# Patient Record
Sex: Female | Born: 1965
Health system: Southern US, Community
[De-identification: ages and names within clinical notes are randomized; demographics above are authoritative.]

## PROBLEM LIST (undated history)

## (undated) DIAGNOSIS — N809 Endometriosis, unspecified: Secondary | ICD-10-CM

## (undated) DIAGNOSIS — T7840XA Allergy, unspecified, initial encounter: Secondary | ICD-10-CM

## (undated) DIAGNOSIS — R51 Headache: Secondary | ICD-10-CM

## (undated) DIAGNOSIS — R600 Localized edema: Secondary | ICD-10-CM

## (undated) DIAGNOSIS — N838 Other noninflammatory disorders of ovary, fallopian tube and broad ligament: Secondary | ICD-10-CM

## (undated) DIAGNOSIS — Z9221 Personal history of antineoplastic chemotherapy: Secondary | ICD-10-CM

## (undated) DIAGNOSIS — C801 Malignant (primary) neoplasm, unspecified: Secondary | ICD-10-CM

## (undated) DIAGNOSIS — E785 Hyperlipidemia, unspecified: Secondary | ICD-10-CM

## (undated) DIAGNOSIS — E119 Type 2 diabetes mellitus without complications: Secondary | ICD-10-CM

## (undated) DIAGNOSIS — Z803 Family history of malignant neoplasm of breast: Secondary | ICD-10-CM

## (undated) DIAGNOSIS — R519 Headache, unspecified: Secondary | ICD-10-CM

## (undated) DIAGNOSIS — F32A Depression, unspecified: Secondary | ICD-10-CM

## (undated) DIAGNOSIS — C50919 Malignant neoplasm of unspecified site of unspecified female breast: Secondary | ICD-10-CM

## (undated) DIAGNOSIS — F329 Major depressive disorder, single episode, unspecified: Secondary | ICD-10-CM

## (undated) DIAGNOSIS — I890 Lymphedema, not elsewhere classified: Secondary | ICD-10-CM

## (undated) DIAGNOSIS — Z923 Personal history of irradiation: Secondary | ICD-10-CM

## (undated) DIAGNOSIS — J189 Pneumonia, unspecified organism: Secondary | ICD-10-CM

## (undated) HISTORY — DX: Localized edema: R60.0

## (undated) HISTORY — DX: Endometriosis, unspecified: N80.9

## (undated) HISTORY — DX: Other noninflammatory disorders of ovary, fallopian tube and broad ligament: N83.8

## (undated) HISTORY — DX: Allergy, unspecified, initial encounter: T78.40XA

## (undated) HISTORY — DX: Family history of malignant neoplasm of breast: Z80.3

## (undated) HISTORY — PX: BREAST BIOPSY: SHX20

## (undated) HISTORY — DX: Type 2 diabetes mellitus without complications: E11.9

## (undated) HISTORY — DX: Hyperlipidemia, unspecified: E78.5

## (undated) HISTORY — DX: Pneumonia, unspecified organism: J18.9

## (undated) HISTORY — DX: Lymphedema, not elsewhere classified: I89.0

## (undated) HISTORY — PX: TUBAL LIGATION: SHX77

## (undated) HISTORY — PX: OOPHORECTOMY: SHX86

---

## 2008-11-24 ENCOUNTER — Ambulatory Visit: Payer: Self-pay | Admitting: Family Medicine

## 2008-11-24 IMAGING — US US BREAST BILAT
1 series · 17 of 25 positions shown · non-contrast
Comparison: none

REASON FOR EXAM: bilateral breast tenderness right beast nodule 6 cm 9
oclock  left breast 4 cm nod...
COMMENTS:

[Series 1: us breast bilat · 17 of 30 slices shown]
[im 1/30]
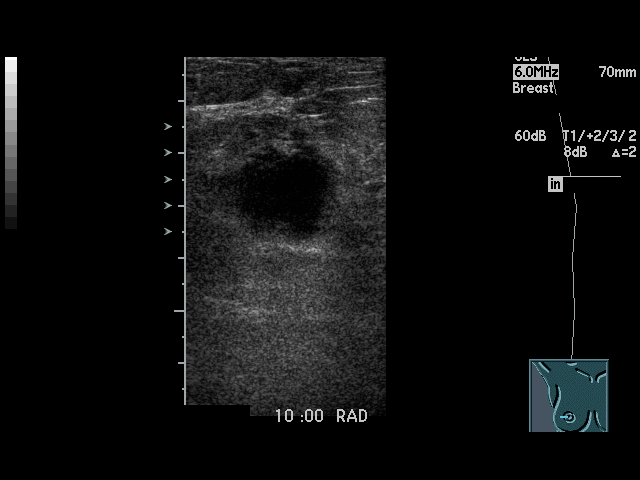
[im 3/30]
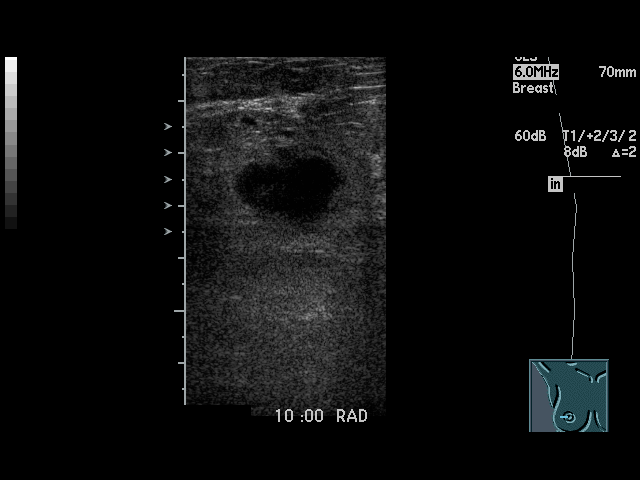
[im 4/30]
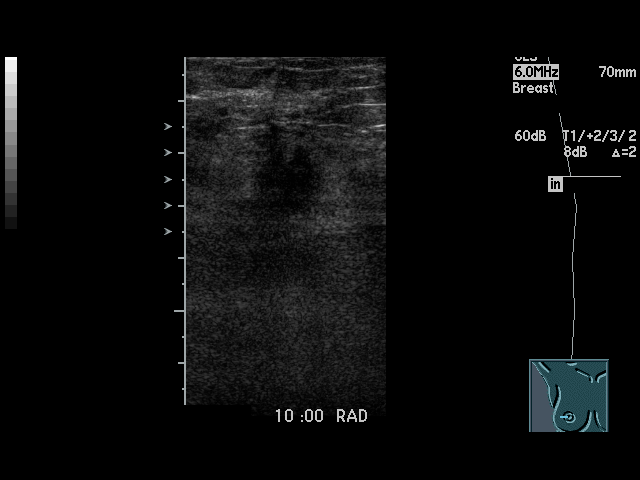
[im 7/30]
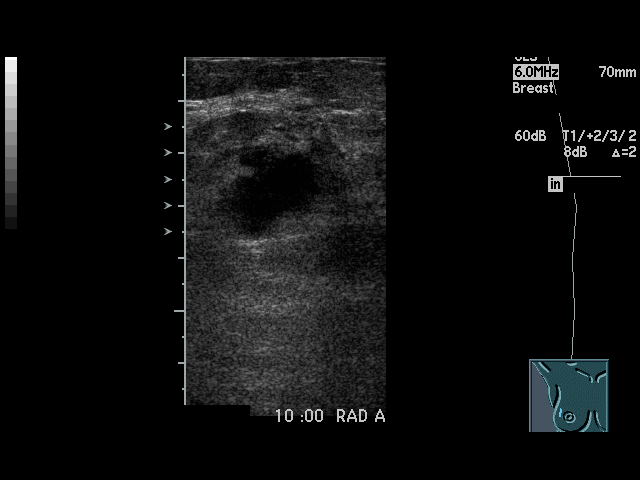
[im 8/30]
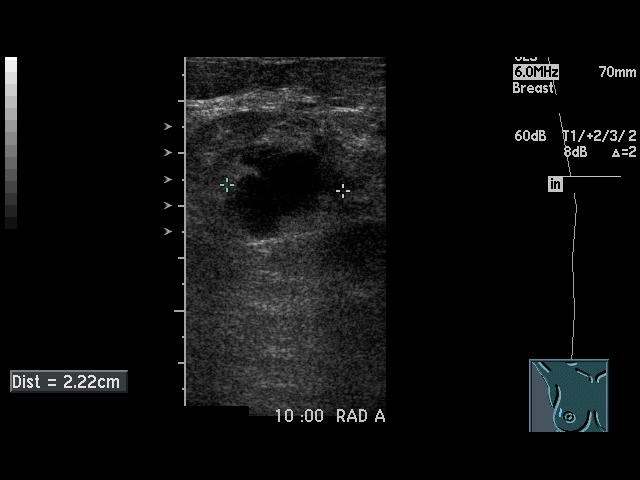
[im 10/30]
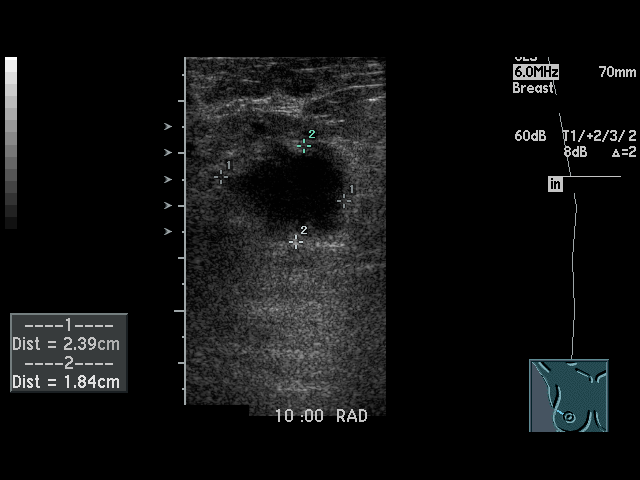
[im 11/30]
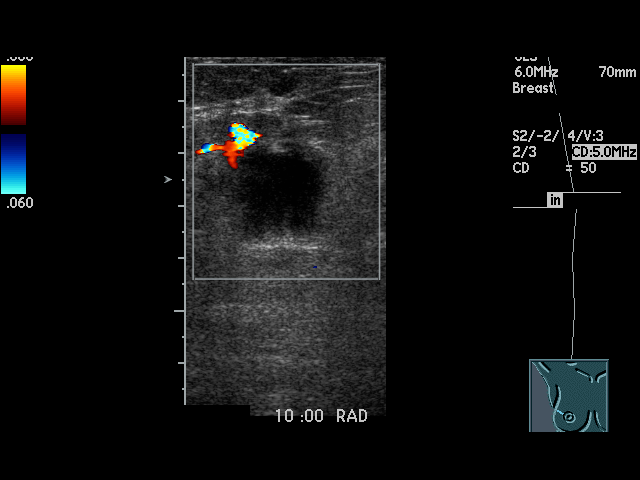
[im 14/30]
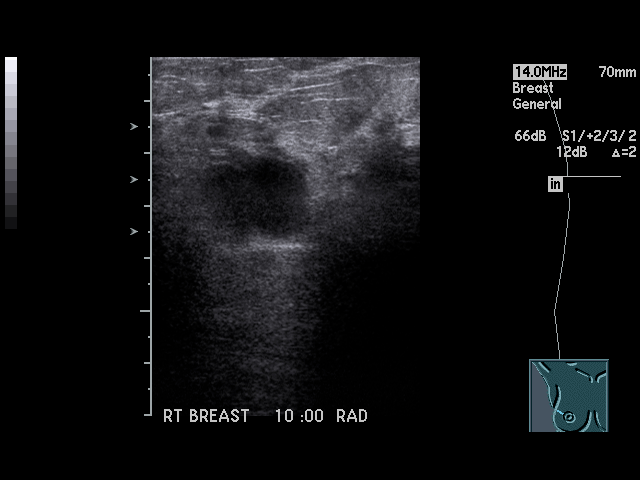
[im 15/30]
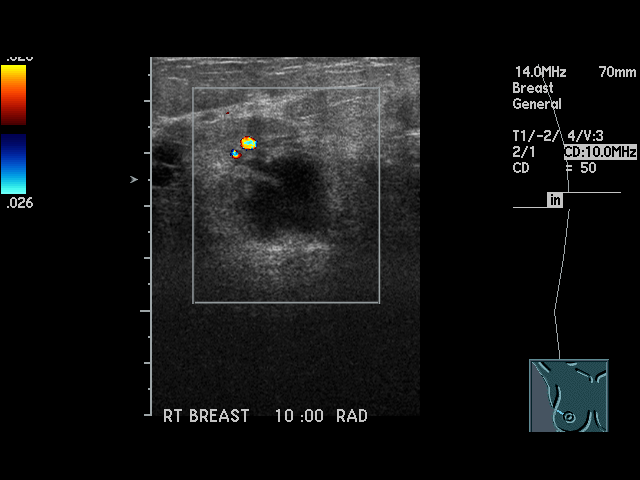
[im 16/30]
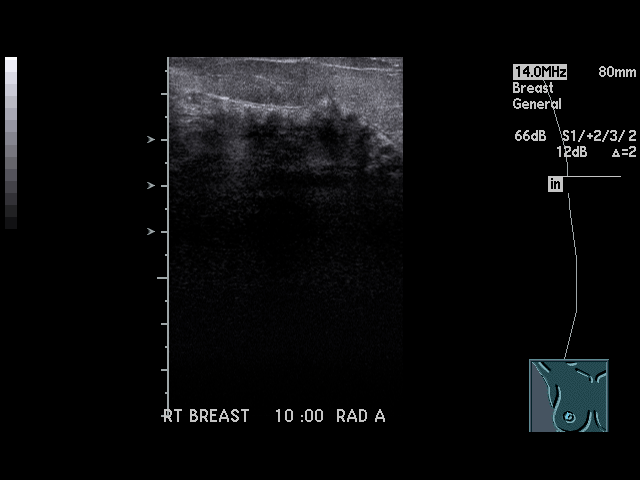
[im 19/30]
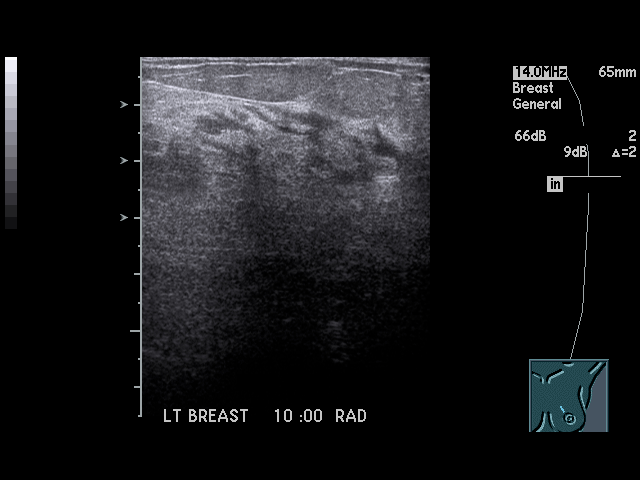
[im 20/30]
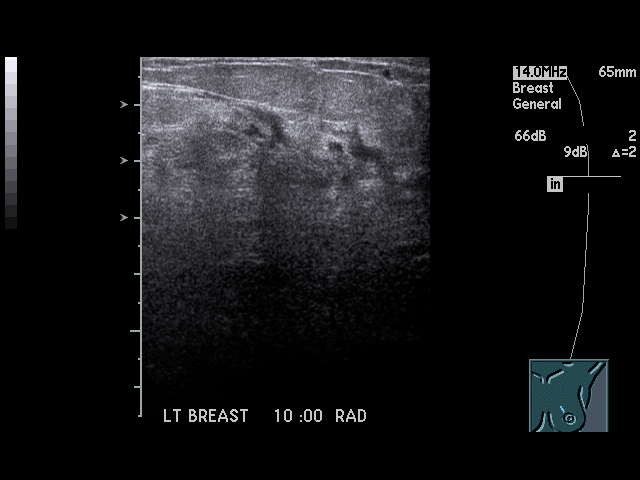
[im 22/30]
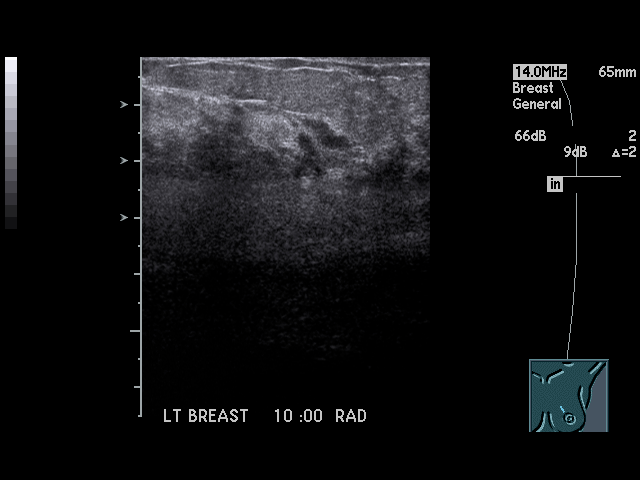
[im 23/30]
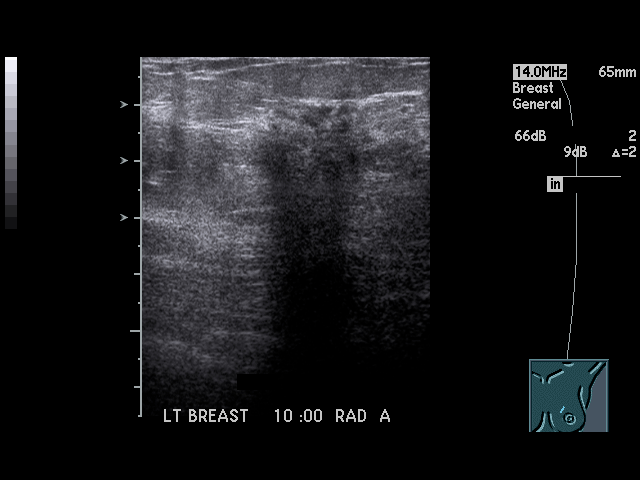
[im 26/30]
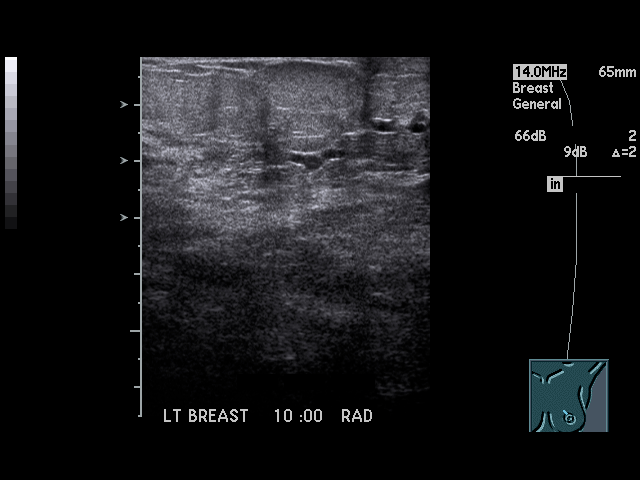
[im 27/30]
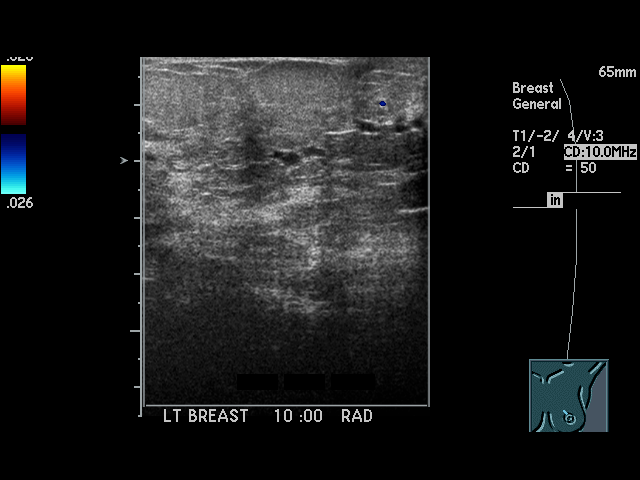
[im 30/30]
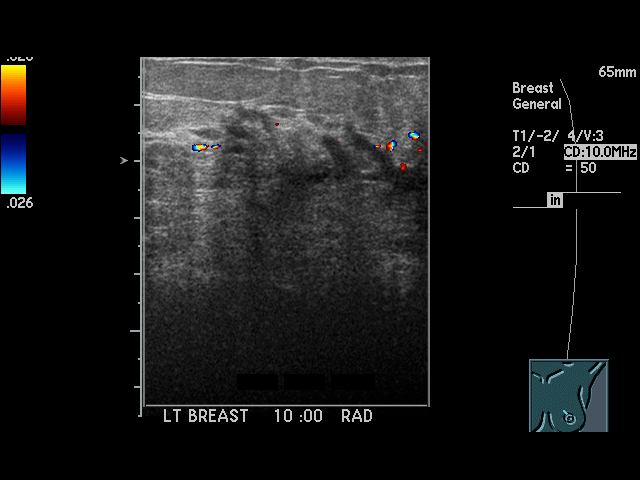

[17 of 25 positions shown; findings below may reference images not displayed]

PROCEDURE:     US  - US BREAST BILATERAL  - [DATE]  [DATE]

RESULT:     The patient is being evaluated for nodularity in the upper inner
aspect of the left breast and upper outer aspect of the right breast.

On the left at the 10 o'clock region there are a few dilated ducts
demonstrated but there is no discrete cystic or solid mass. On the right at
the 10 o'clock position there is an irregularly marginated anechoic
structure with enhanced-through transmission. This measures 2.4 x 1.8 x
cm.
IMPRESSION: 1.On the right at the 10 o'clock position there is a dominant, irregular,
somewhat thick walled cystic appearing structure with enhanced-through
transmission which measures 2.4 cm in greatest dimension.

2.On the left there are a few dilated ducts in the region of the palpable
nodularity at approximately the 10 o'clock position.

Please see the dictation of the mammogram of this same date for final
recommendations and BI-RADS classification.

## 2008-11-24 IMAGING — MG MM CAD DIAGNOSTIC MAMMO
1 series · 8 of 8 positions shown · non-contrast
Comparison: none

REASON FOR EXAM: bil breast tenderness right beast nodule 6 cm 9 oclock
left breast 4 cm nod...
COMMENTS:

[R CC · right · 8 of 14 slices shown]
[im 1/14]
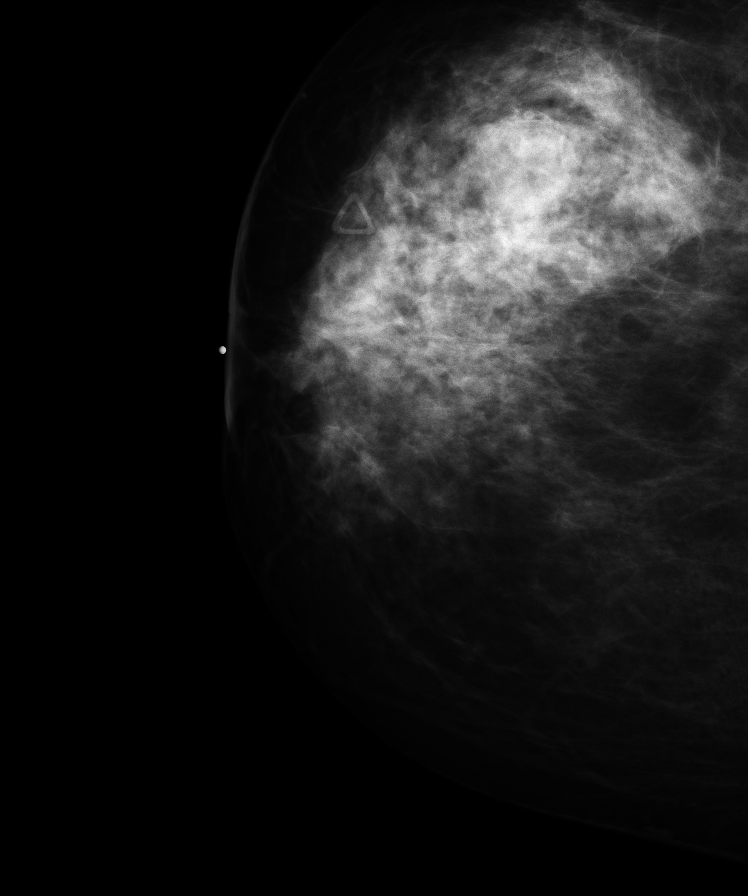
[im 2/14]
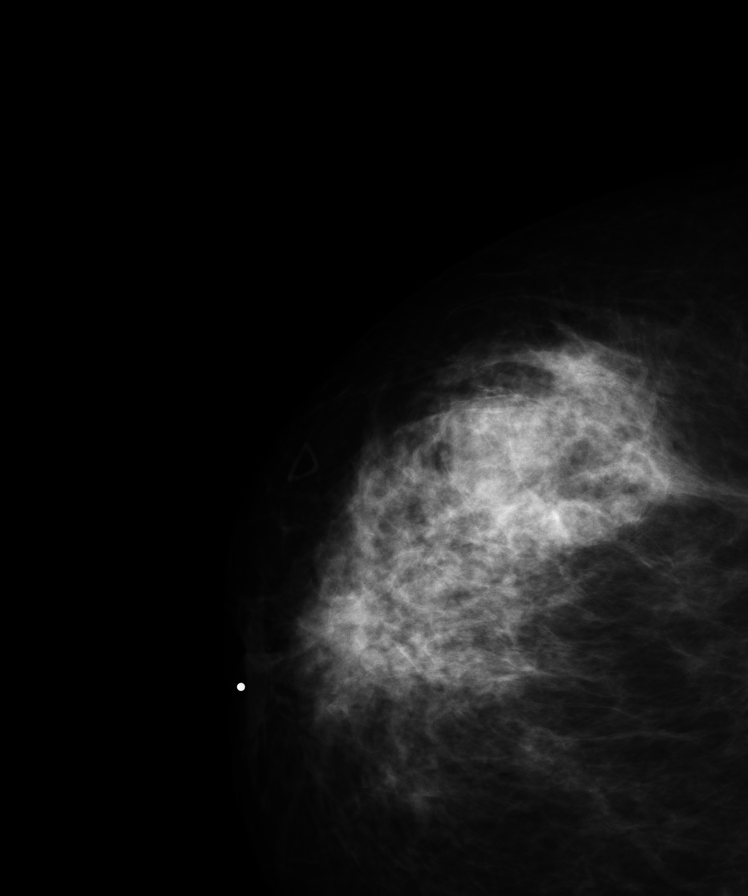
[im 4/14]
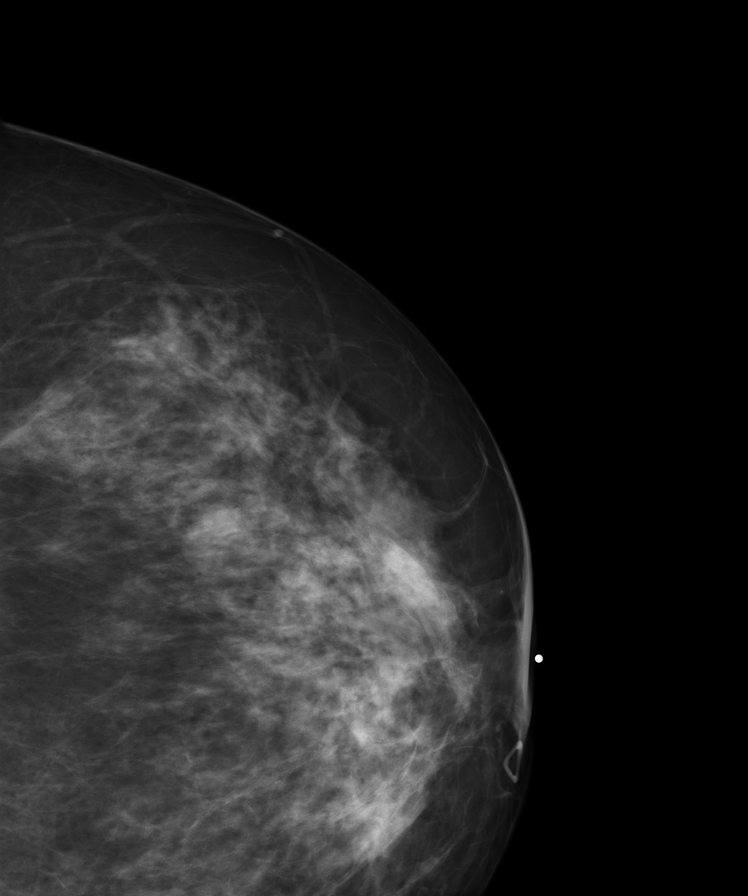
[im 6/14]
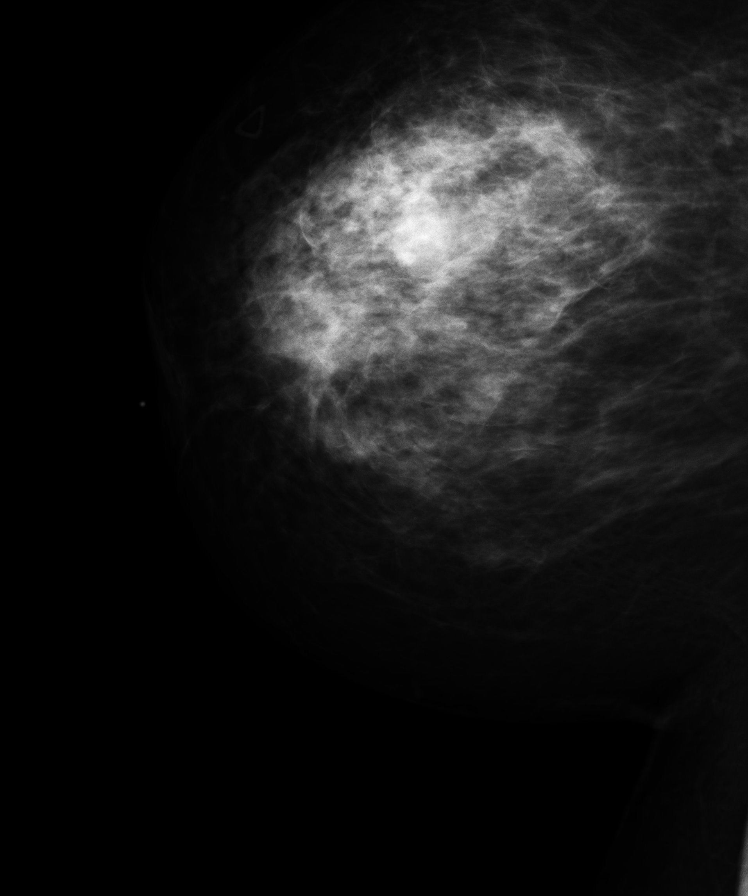
[im 8/14]
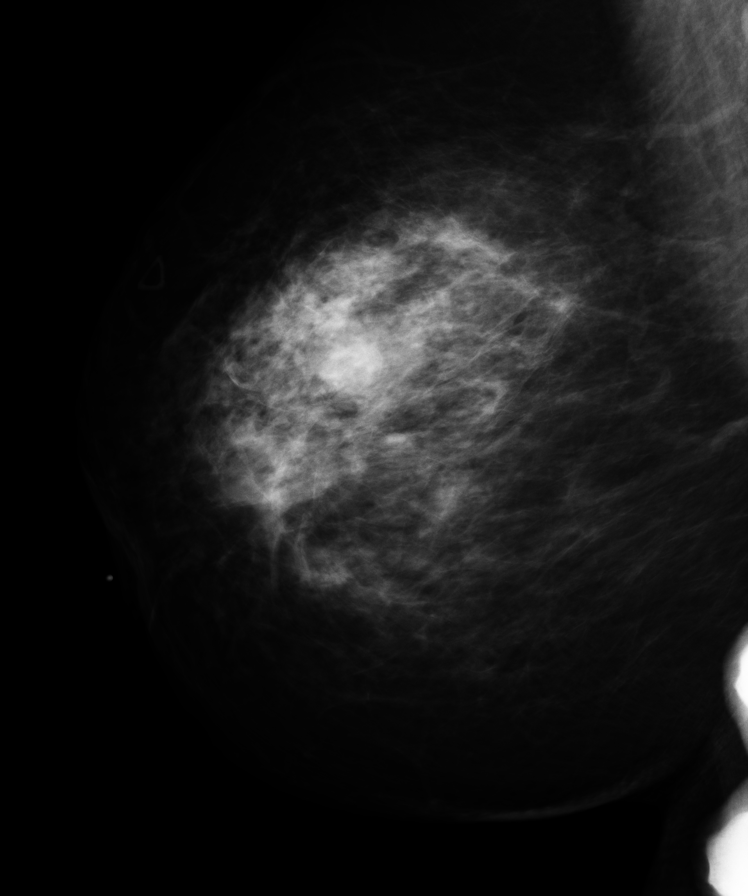
[im 10/14]
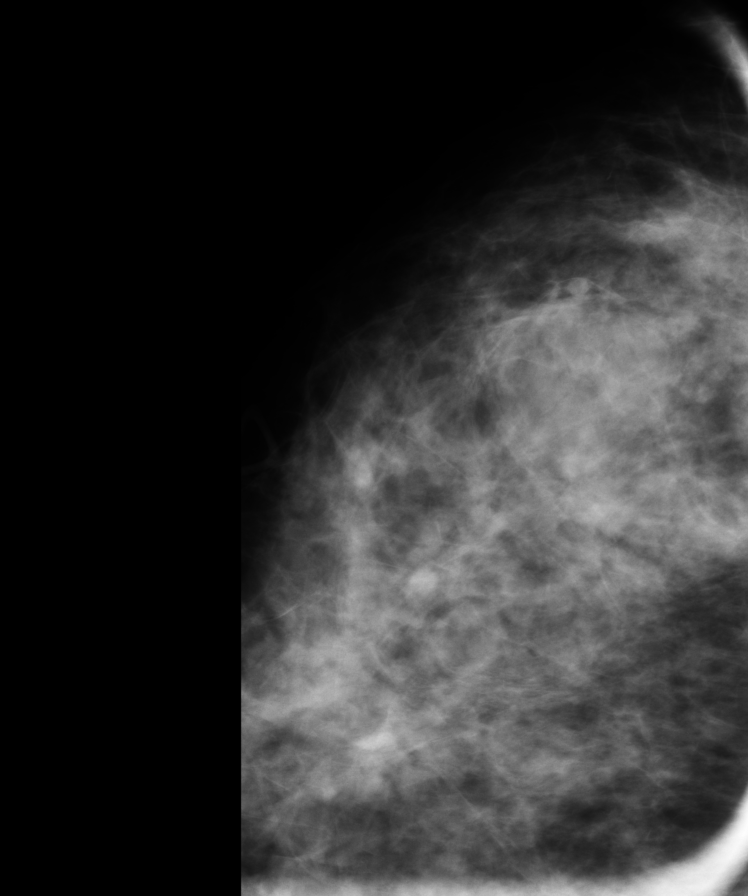
[im 12/14]
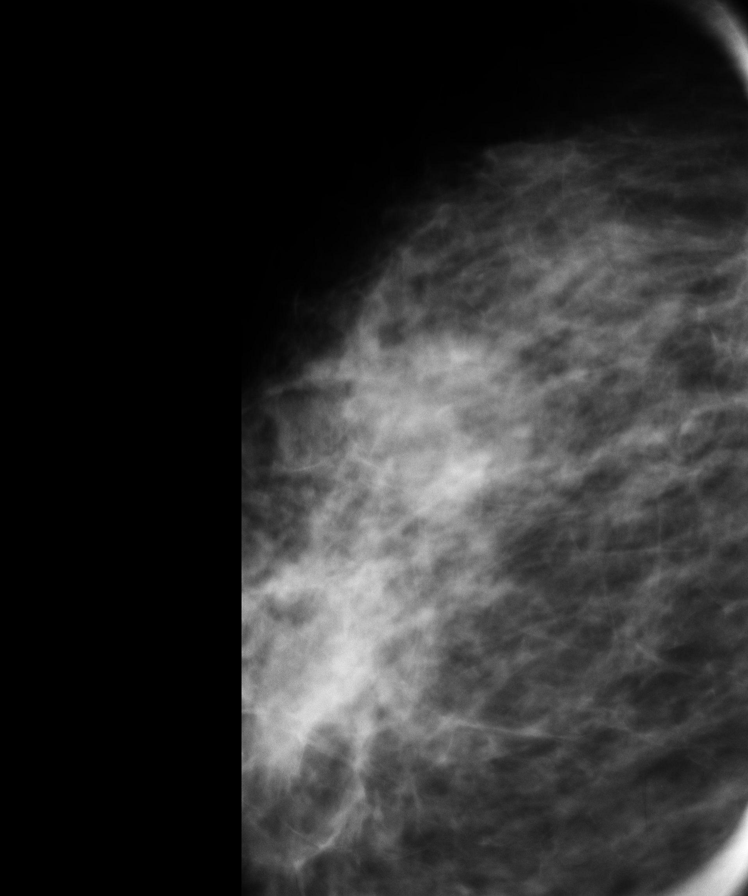
[im 14/14]
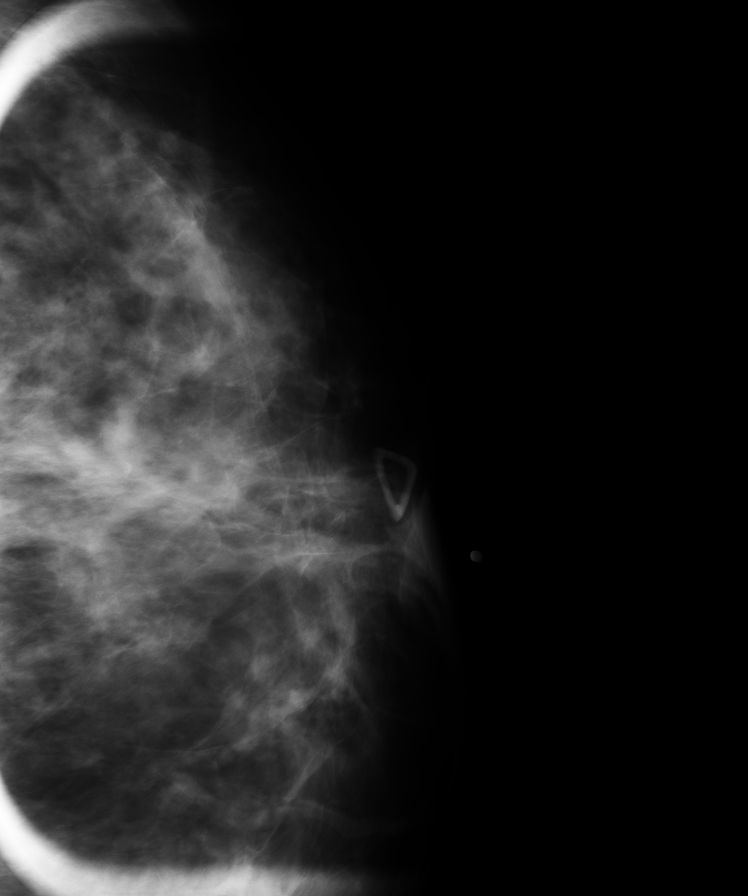

[8 of 8 positions shown; findings below may reference images not displayed]

PROCEDURE:     MAM - MAM DGTL DIAGNOSTIC MAMMO W/CAD  - [DATE]  [DATE]

RESULT:     There are no prior studies for comparison.  Attempts have been
made to obtain prior studies but these have not been received.

The breasts are moderately dense. There is palpable nodularity on the left
that has been marked in the upper inner aspect of the breast at
approximately the 10 o'clock position anteriorly.  Deep to the marker I do
not see suspicious masses mammographically.  There are areas of dense
parenchymal tissue present nearby, however.

On the right a marker has been placed over the upper outer quadrant.  There
is a dominant density noted at approximately the 10 o'clock position on the
MLO view with less well defined margins seen on the CC view. Ultrasound of
this region did demonstrate a thick walled, cystic appearing structure in
this region.  The spot compression views do reveal an area of density
demonstrated best on the right on the MLO and ML views. On the left, a
discrete abnormal mass or area of architectural distortion is not
demonstrated in two views.
IMPRESSION: 1.On the right, there is a dominant, non-simple appearing, cystic structure
in the upper outer quadrant that is felt to correspond to the tender
palpable mass. This could reflect an infected cyst. It is felt to be less
likely a malignancy given the lack of any shadowing and is relatively cystic
appearance.

2.On the left I do not see definite mammographic abnormalities. At
ultrasound dilated ducts are demonstrated.

Surgical consultation with an eye towards consideration for aspiration of
the cystic structure on the right is recommended.  Close clinical
examination of both breasts is recommended as well. Follow-up imaging will
need to be dictated based on clinical grounds.

A negative or indeterminate mammographic and ultrasonic report should not
preclude biopsy of any persistently palpable or otherwise suspicious lesion.

BI-RADS: Category 4-Suspicious Abnormality; while the findings are not
highly suspicious for malignancy, surgical consultation is felt to be
indicated.

A NEGATIVE MAMMOGRAM REPORT DOES NOT PRECLUDE BIOPSY OR OTHER EVALUATION OF
A CLINICALLY PALPABLE OR OTHERWISE SUSPICIOUS MASS OR LESION. BREAST CANCER
MAY NOT BE DETECTED BY MAMMOGRAPHY IN UP TO 10% OF CASES.

## 2011-05-22 ENCOUNTER — Emergency Department: Payer: Self-pay | Admitting: Emergency Medicine

## 2011-05-22 IMAGING — CR DG CHEST 2V
1 series · 2 of 2 positions shown · non-contrast
Comparison: none

REASON FOR EXAM: shob
COMMENTS:

PROCEDURE:     DXR - DXR CHEST PA (OR AP) AND LATERAL  - [DATE]  [DATE]
RESULT:     The lungs are clear. The cardiac silhouette and visualized bony
skeleton are unremarkable.

[Series 1: w chest pa · 0.14mm/px · 2 of 2 slices shown]
[im 1/2]
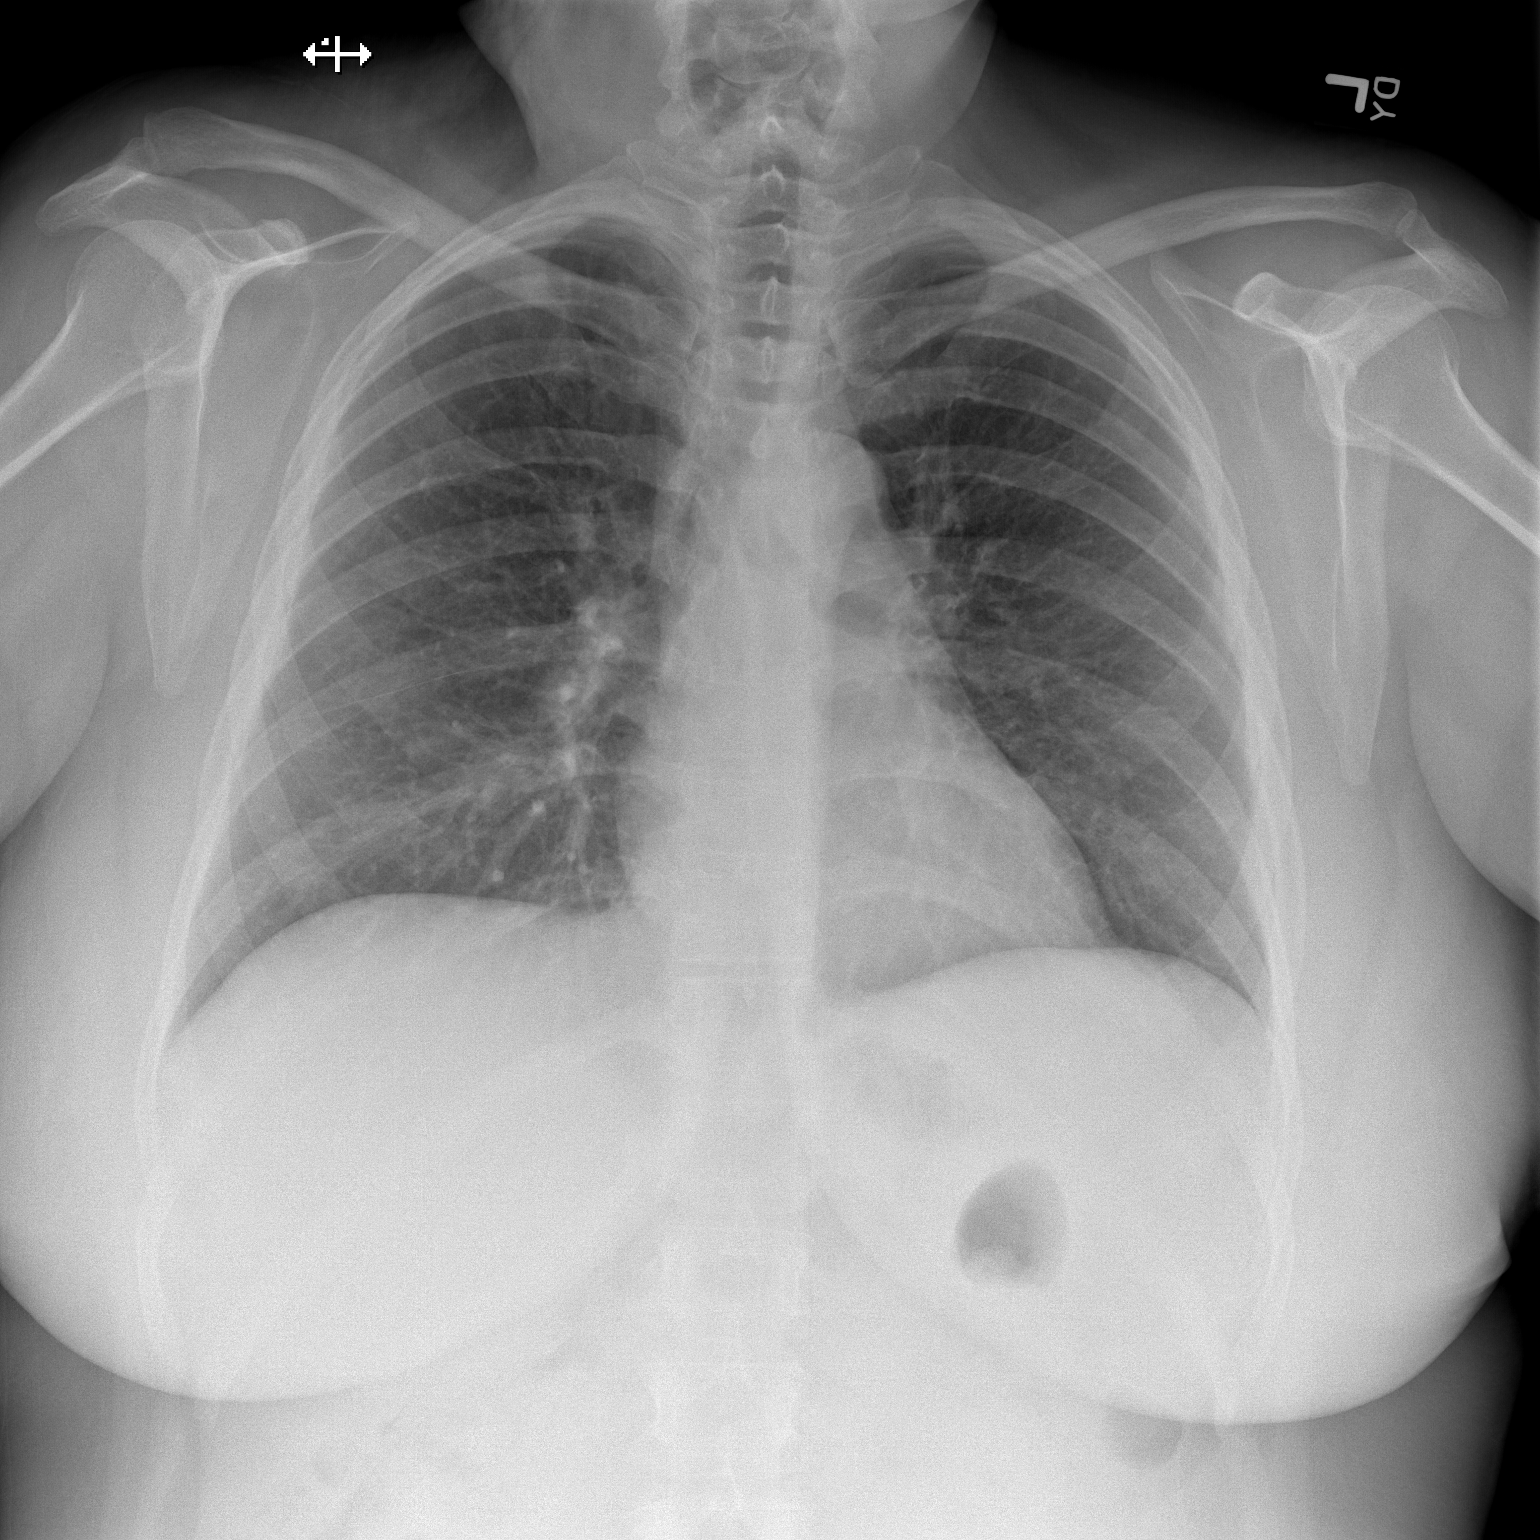
[im 2/2]
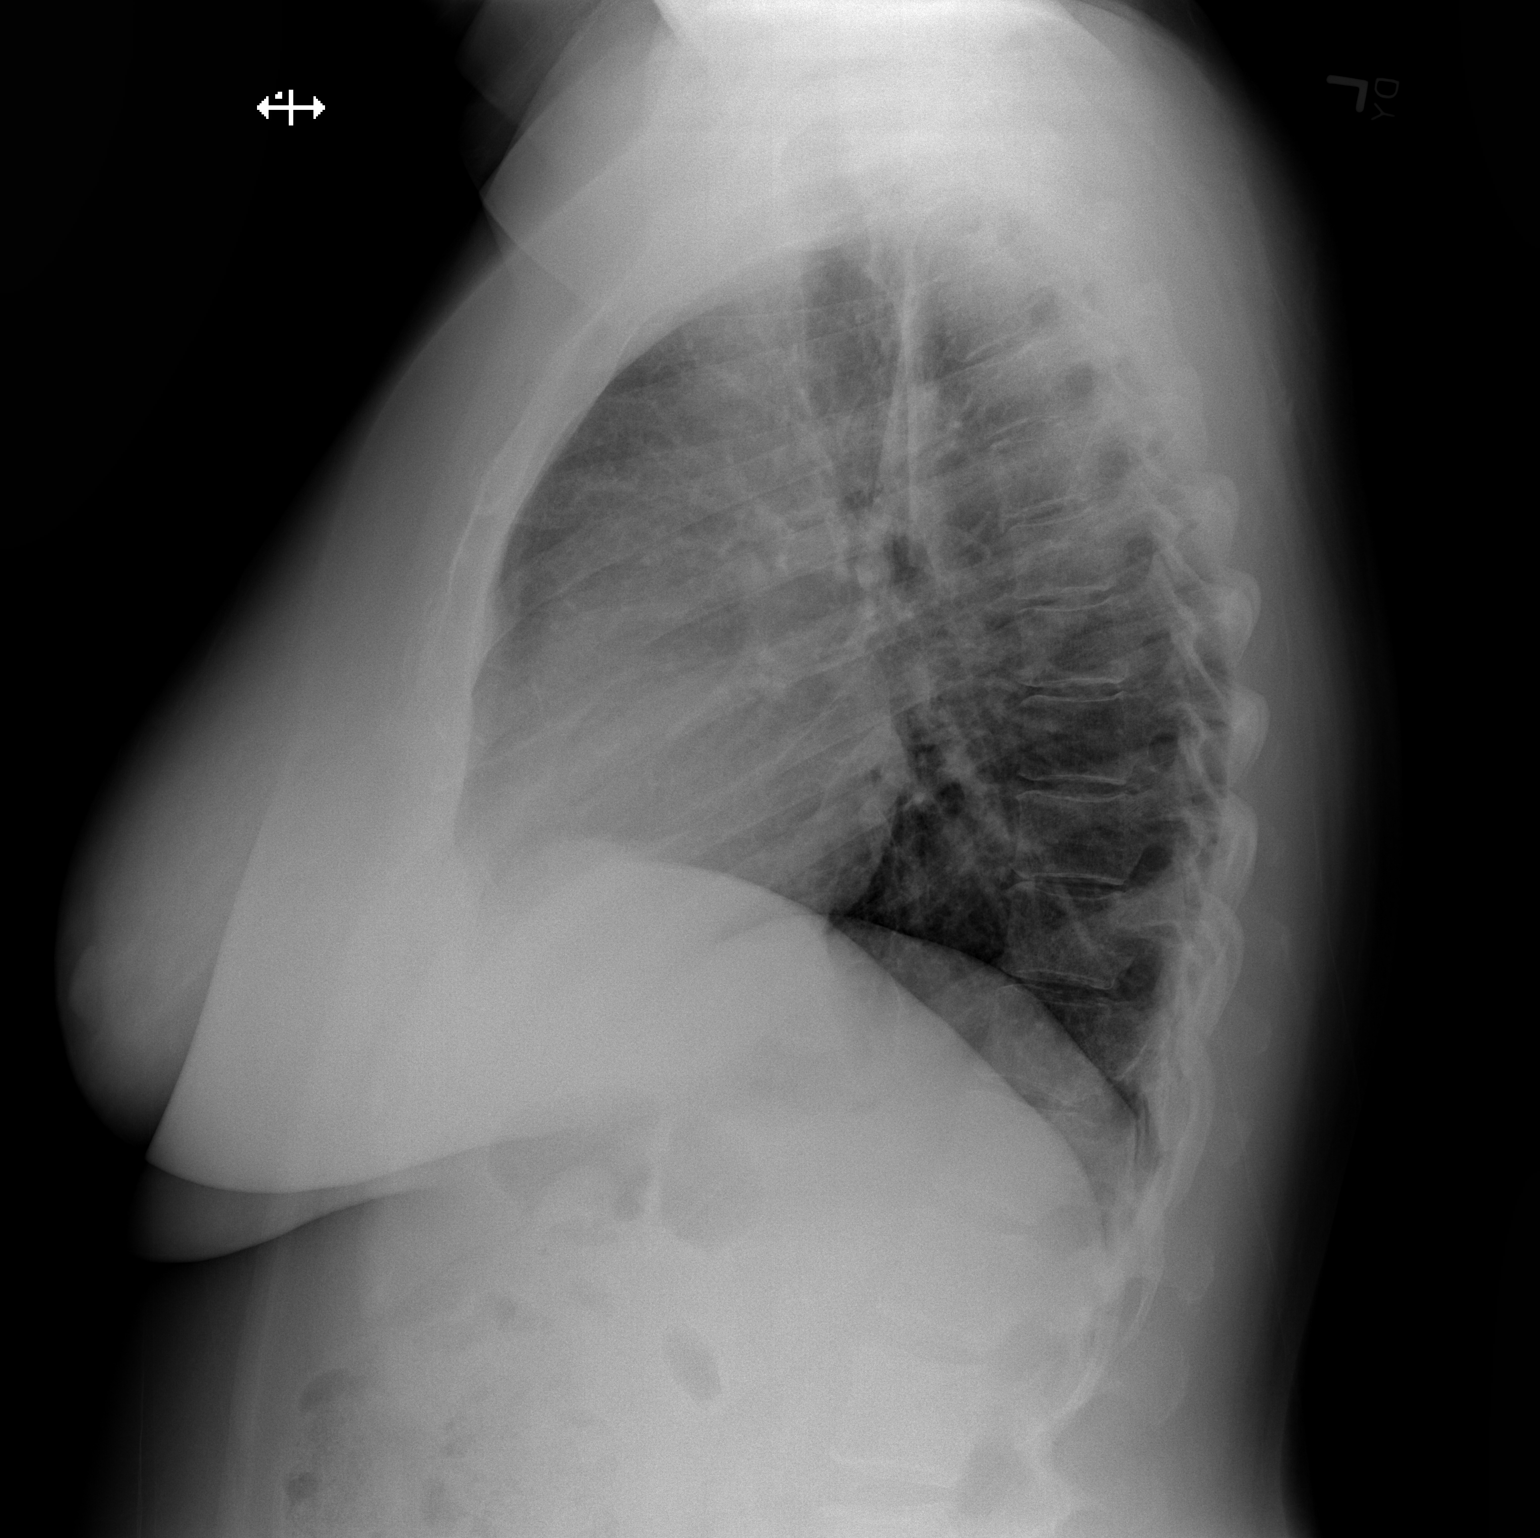

[2 of 2 positions shown; findings below may reference images not displayed]

IMPRESSION: 1. Chest radiograph without evidence of acute cardiopulmonary disease.

## 2011-06-14 ENCOUNTER — Emergency Department: Payer: Self-pay | Admitting: Emergency Medicine

## 2015-02-08 DIAGNOSIS — E119 Type 2 diabetes mellitus without complications: Secondary | ICD-10-CM

## 2015-02-08 HISTORY — DX: Type 2 diabetes mellitus without complications: E11.9

## 2015-03-30 ENCOUNTER — Encounter: Payer: Self-pay | Admitting: Unknown Physician Specialty

## 2015-03-30 ENCOUNTER — Ambulatory Visit (INDEPENDENT_AMBULATORY_CARE_PROVIDER_SITE_OTHER): Payer: Managed Care, Other (non HMO) | Admitting: Unknown Physician Specialty

## 2015-03-30 VITALS — BP 112/72 | HR 102 | Temp 97.9°F | Ht 64.0 in | Wt 191.4 lb

## 2015-03-30 DIAGNOSIS — E1165 Type 2 diabetes mellitus with hyperglycemia: Secondary | ICD-10-CM | POA: Insufficient documentation

## 2015-03-30 DIAGNOSIS — R5382 Chronic fatigue, unspecified: Secondary | ICD-10-CM | POA: Insufficient documentation

## 2015-03-30 DIAGNOSIS — E119 Type 2 diabetes mellitus without complications: Secondary | ICD-10-CM

## 2015-03-30 DIAGNOSIS — F329 Major depressive disorder, single episode, unspecified: Secondary | ICD-10-CM

## 2015-03-30 DIAGNOSIS — Z23 Encounter for immunization: Secondary | ICD-10-CM | POA: Diagnosis not present

## 2015-03-30 DIAGNOSIS — F322 Major depressive disorder, single episode, severe without psychotic features: Secondary | ICD-10-CM

## 2015-03-30 LAB — LIPID PANEL PICCOLO, WAIVED
Chol/HDL Ratio Piccolo,Waive: 3.8 mg/dL
Cholesterol Piccolo, Waived: 210 mg/dL — ABNORMAL HIGH (ref <200)
HDL Chol Piccolo, Waived: 55 mg/dL — ABNORMAL LOW (ref >59)
LDL Chol Calc Piccolo Waived: 98 mg/dL (ref <100)
Triglycerides Piccolo,Waived: 287 mg/dL — ABNORMAL HIGH (ref <150)
VLDL Chol Calc Piccolo,Waive: 57 mg/dL — ABNORMAL HIGH (ref <30)

## 2015-03-30 LAB — MICROALBUMIN, URINE WAIVED
Creatinine, Urine Waived: 50 mg/dL (ref 10–300)
Microalb, Ur Waived: 10 mg/L (ref 0–19)

## 2015-03-30 LAB — BAYER DCA HB A1C WAIVED: HB A1C (BAYER DCA - WAIVED): 13.1 % — ABNORMAL HIGH (ref <7.0)

## 2015-03-30 MED ORDER — BLOOD GLUCOSE MONITOR KIT: PACK | Status: DC

## 2015-03-30 MED ORDER — METFORMIN HCL 500 MG PO TABS
1000.0000 mg | ORAL_TABLET | Freq: Two times a day (BID) | ORAL | Status: DC
Start: 2015-03-30 — End: 2015-05-26

## 2015-03-30 MED ORDER — ASPIRIN 81 MG PO TABS: 81.0000 mg | ORAL_TABLET | Freq: Every day | ORAL | Status: DC

## 2015-03-30 NOTE — Patient Instructions (Signed)
Blood Glucose Monitoring, Adult Monitoring your blood glucose (also know as blood sugar) helps you to manage your diabetes. It also helps you and your health care provider monitor your diabetes and determine how well your treatment plan is working. WHY SHOULD YOU MONITOR YOUR BLOOD GLUCOSE?  It can help you understand how food, exercise, and medicine affect your blood glucose.  It allows you to know what your blood glucose is at any given moment. You can quickly tell if you are having low blood glucose (hypoglycemia) or high blood glucose (hyperglycemia).  It can help you and your health care provider know how to adjust your medicines.  It can help you understand how to manage an illness or adjust medicine for exercise. WHEN SHOULD YOU TEST? Your health care provider will help you decide how often you should check your blood glucose. This may depend on the type of diabetes you have, your diabetes control, or the types of medicines you are taking. Be sure to write down all of your blood glucose readings so that this information can be reviewed with your health care provider. See below for examples of testing times that your health care provider may suggest. Type 1 Diabetes  Test at least 2 times per day if your diabetes is well controlled, if you are using an insulin pump, or if you perform multiple daily injections.  If your diabetes is not well controlled or if you are sick, you may need to test more often.  It is a good idea to also test:  Before every insulin injection.  Before and after exercise.  Between meals and 2 hours after a meal.  Occasionally between 2:00 a.m. and 3:00 a.m. Type 2 Diabetes  If you are taking insulin, test at least 2 times per day. However, it is best to test before every insulin injection.  If you take medicines by mouth (orally), test 2 times a day.  If you are on a controlled diet, test once a day.  If your diabetes is not well controlled or if you  are sick, you may need to monitor more often. HOW TO MONITOR YOUR BLOOD GLUCOSE Supplies Needed  Blood glucose meter.  Test strips for your meter. Each meter has its own strips. You must use the strips that go with your own meter.  A pricking needle (lancet).  A device that holds the lancet (lancing device).  A journal or log book to write down your results. Procedure  Wash your hands with soap and water. Alcohol is not preferred.  Prick the side of your finger (not the tip) with the lancet.  Gently milk the finger until a small drop of blood appears.  Follow the instructions that come with your meter for inserting the test strip, applying blood to the strip, and using your blood glucose meter. Other Areas to Get Blood for Testing Some meters allow you to use other areas of your body (other than your finger) to test your blood. These areas are called alternative sites. The most common alternative sites are:  The forearm.  The thigh.  The back area of the lower leg.  The palm of the hand. The blood flow in these areas is slower. Therefore, the blood glucose values you get may be delayed, and the numbers are different from what you would get from your fingers. Do not use alternative sites if you think you are having hypoglycemia. Your reading will not be accurate. Always use a finger if you are  having hypoglycemia. Also, if you cannot feel your lows (hypoglycemia unawareness), always use your fingers for your blood glucose checks. ADDITIONAL TIPS FOR GLUCOSE MONITORING  Do not reuse lancets.  Always carry your supplies with you.  All blood glucose meters have a 24-hour "hotline" number to call if you have questions or need help.  Adjust (calibrate) your blood glucose meter with a control solution after finishing a few boxes of strips. BLOOD GLUCOSE RECORD KEEPING It is a good idea to keep a daily record or log of your blood glucose readings. Most glucose meters, if not all,  keep your glucose records stored in the meter. Some meters come with the ability to download your records to your home computer. Keeping a record of your blood glucose readings is especially helpful if you are wanting to look for patterns. Make notes to go along with the blood glucose readings because you might forget what happened at that exact time. Keeping good records helps you and your health care provider to work together to achieve good diabetes management.    This information is not intended to replace advice given to you by your health care provider. Make sure you discuss any questions you have with your health care provider.   Document Released: 01/27/2003 Document Revised: 02/14/2014 Document Reviewed: 06/18/2012 Elsevier Interactive Patient Education 2016 [employer]. Diabetes Mellitus and Food It is important for you to manage your blood sugar (glucose) level. Your blood glucose level can be greatly affected by what you eat. Eating healthier foods in the appropriate amounts throughout the day at about the same time each day will help you control your blood glucose level. It can also help slow or prevent worsening of your diabetes mellitus. Healthy eating may even help you improve the level of your blood pressure and reach or maintain a healthy weight.  General recommendations for healthful eating and cooking habits include:  Eating meals and snacks regularly. Avoid going long periods of time without eating to lose weight.  Eating a diet that consists mainly of plant-based foods, such as fruits, vegetables, nuts, legumes, and whole grains.  Using low-heat cooking methods, such as baking, instead of high-heat cooking methods, such as deep frying. Work with your dietitian to make sure you understand how to use the Nutrition Facts information on food labels. HOW CAN FOOD AFFECT ME? Carbohydrates Carbohydrates affect your blood glucose level more than any other type of food. Your  dietitian will help you determine how many carbohydrates to eat at each meal and teach you how to count carbohydrates. Counting carbohydrates is important to keep your blood glucose at a healthy level, especially if you are using insulin or taking certain medicines for diabetes mellitus. Alcohol Alcohol can cause sudden decreases in blood glucose (hypoglycemia), especially if you use insulin or take certain medicines for diabetes mellitus. Hypoglycemia can be a life-threatening condition. Symptoms of hypoglycemia (sleepiness, dizziness, and disorientation) are similar to symptoms of having too much alcohol.  If your health care provider has given you approval to drink alcohol, do so in moderation and use the following guidelines:  Women should not have more than one drink per day, and men should not have more than two drinks per day. One drink is equal to:  12 oz of beer.  5 oz of wine.  1 oz of hard liquor.  Do not drink on an empty stomach.  Keep yourself hydrated. Have water, diet soda, or unsweetened iced tea.  Regular soda, juice, and other  mixers might contain a lot of carbohydrates and should be counted. WHAT FOODS ARE NOT RECOMMENDED? As you make food choices, it is important to remember that all foods are not the same. Some foods have fewer nutrients per serving than other foods, even though they might have the same number of calories or carbohydrates. It is difficult to get your body what it needs when you eat foods with fewer nutrients. Examples of foods that you should avoid that are high in calories and carbohydrates but low in nutrients include:  Trans fats (most processed foods list trans fats on the Nutrition Facts label).  Regular soda.  Juice.  Candy.  Sweets, such as cake, pie, doughnuts, and cookies.  Fried foods. WHAT FOODS CAN I EAT? Eat nutrient-rich foods, which will nourish your body and keep you healthy. The food you should eat also will depend on several  factors, including:  The calories you need.  The medicines you take.  Your weight.  Your blood glucose level.  Your blood pressure level.  Your cholesterol level. You should eat a variety of foods, including:  Protein.  Lean cuts of meat.  Proteins low in saturated fats, such as fish, egg whites, and beans. Avoid processed meats.  Fruits and vegetables.  Fruits and vegetables that may help control blood glucose levels, such as apples, mangoes, and yams.  Dairy products.  Choose fat-free or low-fat dairy products, such as milk, yogurt, and cheese.  Grains, bread, pasta, and rice.  Choose whole grain products, such as multigrain bread, whole oats, and brown rice. These foods may help control blood pressure.  Fats.  Foods containing healthful fats, such as nuts, avocado, olive oil, canola oil, and fish. DOES EVERYONE WITH DIABETES MELLITUS HAVE THE SAME MEAL PLAN? Because every person with diabetes mellitus is different, there is not one meal plan that works for everyone. It is very important that you meet with a dietitian who will help you create a meal plan that is just right for you.   This information is not intended to replace advice given to you by your health care provider. Make sure you discuss any questions you have with your health care provider.   Document Released: 10/21/2004 Document Revised: 02/14/2014 Document Reviewed: 12/21/2012 Elsevier Interactive Patient Education 2016 [employer]. How to Avoid Diabetes Problems You can do a lot to prevent or slow down diabetes problems. Following your diabetes plan and taking care of yourself can reduce your risk of serious or life-threatening complications. Below, you will find certain things you can do to prevent diabetes problems. MANAGE YOUR DIABETES Follow your health care provider's, nurse educator's, and dietitian's instructions for managing your diabetes. They will teach you the basics of diabetes care. They  can help answer questions you may have. Learn about diabetes and make healthy choices regarding eating and physical activity. Monitor your blood glucose level regularly. Your health care provider will help you decide how often to check your blood glucose level depending on your treatment goals and how well you are meeting them.  DO NOT USE NICOTINE Nicotine and diabetes are a dangerous combination. Nicotine raises your risk for diabetes problems. If you quit using nicotine, you will lower your risk for heart attack, stroke, nerve disease, and kidney disease. Your cholesterol and your blood pressure levels may improve. Your blood circulation will also improve. Do not use any tobacco products, including cigarettes, chewing tobacco, or electronic cigarettes. If you need help quitting, ask your health care provider. KEEP  YOUR BLOOD PRESSURE UNDER CONTROL Your health care provider will determine your individualized target blood pressure based on your age, your medicines, how long you have had diabetes, and any other medical conditions you have. Blood pressure consists of two numbers. Generally, the goal is to keep your top number (systolic pressure) at or below 130, and your bottom number (diastolic pressure) at or below 80. Your health care provider may recommend a lower target blood pressure reading, if appropriate. Meal planning, medicines, and exercise can help you reach your target blood pressure. Make sure your health care provider checks your blood pressure at every visit. KEEP YOUR CHOLESTEROL UNDER CONTROL Normal cholesterol levels will help prevent heart disease and stroke. These are the biggest health problems for people with diabetes. Keeping cholesterol levels under control can also help with blood flow. Have your cholesterol level checked at least once a year. Your health care provider may prescribe a medicine known as a statin. Statins lower your cholesterol. If you are not taking a statin, ask your  health care provider if you should be. Meal planning, exercise, and medicines can help you reach your cholesterol targets.  SCHEDULE AND KEEP YOUR ANNUAL PHYSICAL EXAMS AND EYE EXAMS Your health care provider will tell you how often he or she wants to see you depending on your plan of treatment. It is important that you keep these appointments so that possible problems can be identified early and complications can be avoided or treated.  Every visit with your health care provider should include your weight, blood pressure, and an evaluation of your blood glucose control.  Your hemoglobin A1c should be checked:  At least twice a year if you are at your goal.  Every 3 months if there are changes in treatment.  If you are not meeting your goals.  Your blood lipids should be checked yearly. You should also be checked yearly to see if you have protein in your urine (microalbumin).  Schedule a dilated eye exam within 5 years of your diagnosis if you have type 1 diabetes, and then yearly. Schedule a dilated eye exam at diagnosis if you have type 2 diabetes, and then yearly. All exams thereafter can be extended to every 2 to 3 years if one or more exams have been normal. KEEP YOUR VACCINES CURRENT It is recommended that you receive a flu (influenza) vaccine every year. It is also recommended that you receive a pneumonia (pneumococcal) vaccine. If you are 22 years of age or older and have never received a pneumonia vaccine, this vaccine may be given as a series of two separate shots. Ask your health care provider which additional vaccines may be recommended. TAKE CARE OF YOUR FEET  Diabetes may cause you to have a poor blood supply (circulation) to your legs and feet. Because of this, the skin may be thinner, break easier, and heal more slowly. You also may have nerve damage in your legs and feet, causing decreased feeling. You may not notice minor injuries to your feet that could lead to serious problems  or infections. Taking care of your feet is very important. Visual foot exams are performed at every routine medical visit. The exams check for cuts, injuries, or other problems with the feet. A comprehensive foot exam should be done yearly. This includes visual inspection as well as assessing foot pulses and testing for loss of sensation. You should also do the following:  Inspect your feet daily for cuts, calluses, blisters, ingrown toenails, and signs  of infection, such as redness, swelling, or pus.  Wash and dry your feet thoroughly, especially between the toes.  Avoid soaking your feet regularly in hot water baths.  Moisturize dry skin with lotion, avoiding areas between your toes.  Cut toenails straight across and file the edges.  Avoid shoes that do not fit well or have areas that irritate your skin.  Avoid going barefooted or wearing only socks. Your feet need protection. TAKE CARE OF YOUR TEETH People with poorly controlled diabetes are more likely to have gum (periodontal) disease. These infections make diabetes harder to control. Periodontal diseases, if left untreated, can lead to tooth loss. Brush your teeth twice a day, floss, and see your dentist for checkups and cleaning every 6 months, or 2 times a year. ASK YOUR HEALTH CARE PROVIDER ABOUT TAKING ASPIRIN Taking aspirin daily is recommended to help prevent cardiovascular disease in people with and without diabetes. Ask your health care provider if this would benefit you and what dose he or she would recommend. DRINK RESPONSIBLY Moderate amounts of alcohol (less than 1 drink per day for adult women and less than 2 drinks per day for adult men) have a minimal effect on blood glucose if ingested with food. It is important to eat food with alcohol to avoid hypoglycemia. People should avoid alcohol if they have a history of alcohol abuse or dependence, if they are pregnant, and if they have liver disease, pancreatitis, advanced  neuropathy, or severe hypertriglyceridemia. LESSEN STRESS Living with diabetes can be stressful. When you are under stress, your blood glucose may be affected in two ways:  Stress hormones may cause your blood glucose to rise.  You may be distracted from taking good care of yourself. It is a good idea to be aware of your stress level and make changes that are necessary to help you better manage challenging situations. Support groups, planned relaxation, a hobby you enjoy, meditation, healthy relationships, and exercise all work to lower your stress level. If your efforts do not seem to be helping, get help from your health care provider or a trained mental health professional.   This information is not intended to replace advice given to you by your health care provider. Make sure you discuss any questions you have with your health care provider.   Document Released: 10/12/2010 Document Revised: 02/14/2014 Document Reviewed: 03/20/2013 Elsevier Interactive Patient Education 2016 [employer] Type 2 Diabetes Mellitus, Adult Type 2 diabetes mellitus, often simply referred to as type 2 diabetes, is a long-lasting (chronic) disease. In type 2 diabetes, the pancreas does not make enough insulin (a hormone), the cells are less responsive to the insulin that is made (insulin resistance), or both. Normally, insulin moves sugars from food into the tissue cells. The tissue cells use the sugars for energy. The lack of insulin or the lack of normal response to insulin causes excess sugars to build up in the blood instead of going into the tissue cells. As a result, high blood sugar (hyperglycemia) develops. The effect of high sugar (glucose) levels can cause many complications. Type 2 diabetes was also previously called adult-onset diabetes, but it can occur at any age.  RISK FACTORS  A person is predisposed to developing type 2 diabetes if someone in the family has the disease and also has one or more of  the following primary risk factors:  Weight gain, or being overweight or obese.  An inactive lifestyle.  A history of consistently eating high-calorie foods. Maintaining a  normal weight and regular physical activity can reduce the chance of developing type 2 diabetes. SYMPTOMS  A person with type 2 diabetes may not show symptoms initially. The symptoms of type 2 diabetes appear slowly. The symptoms include:  Increased thirst (polydipsia).  Increased urination (polyuria).  Increased urination during the night (nocturia).  Sudden or unexplained weight changes.  Frequent, recurring infections.  Tiredness (fatigue).  Weakness.  Vision changes, such as blurred vision.  Fruity smell to your breath.  Abdominal pain.  Nausea or vomiting.  Cuts or bruises which are slow to heal.  Tingling or numbness in the hands or feet.  An open skin wound (ulcer). DIAGNOSIS Type 2 diabetes is frequently not diagnosed until complications of diabetes are present. Type 2 diabetes is diagnosed when symptoms or complications are present and when blood glucose levels are increased. Your blood glucose level may be checked by one or more of the following blood tests:  A fasting blood glucose test. You will not be allowed to eat for at least 8 hours before a blood sample is taken.  A random blood glucose test. Your blood glucose is checked at any time of the day regardless of when you ate.  A hemoglobin A1c blood glucose test. A hemoglobin A1c test provides information about blood glucose control over the previous 3 months.  An oral glucose tolerance test (OGTT). Your blood glucose is measured after you have not eaten (fasted) for 2 hours and then after you drink a glucose-containing beverage. TREATMENT   You may need to take insulin or diabetes medicine daily to keep blood glucose levels in the desired range.  If you use insulin, you may need to adjust the dosage depending on the carbohydrates  that you eat with each meal or snack.  Lifestyle changes are recommended as part of your treatment. These may include:  Following an individualized diet plan developed by a nutritionist or dietitian.  Exercising daily. Your health care providers will set individualized treatment goals for you based on your age, your medicines, how long you have had diabetes, and any other medical conditions you have. Generally, the goal of treatment is to maintain the following blood glucose levels:  Before meals (preprandial): 80-130 mg/dL.  After meals (postprandial): below 180 mg/dL.  A1c: less than 6.5-7%. HOME CARE INSTRUCTIONS   Have your hemoglobin A1c level checked twice a year.  Perform daily blood glucose monitoring as directed by your health care provider.  Monitor urine ketones when you are ill and as directed by your health care provider.  Take your diabetes medicine or insulin as directed by your health care provider to maintain your blood glucose levels in the desired range.  Never run out of diabetes medicine or insulin. It is needed every day.  If you are using insulin, you may need to adjust the amount of insulin given based on your intake of carbohydrates. Carbohydrates can raise blood glucose levels but need to be included in your diet. Carbohydrates provide vitamins, minerals, and fiber which are an essential part of a healthy diet. Carbohydrates are found in fruits, vegetables, whole grains, dairy products, legumes, and foods containing added sugars.  Eat healthy foods. You should make an appointment to see a registered dietitian to help you create an eating plan that is right for you.  Lose weight if you are overweight.  Carry a medical alert card or wear your medical alert jewelry.  Carry a 15-gram carbohydrate snack with you at all times to treat  low blood glucose (hypoglycemia). Some examples of 15-gram carbohydrate snacks include:  Glucose tablets, 3 or 4.  Glucose  gel, 15-gram tube.  Raisins, 2 tablespoons (24 grams).  Jelly beans, 6.  Animal crackers, 8.  Regular pop, 4 ounces (120 mL).  Gummy treats, 9.  Recognize hypoglycemia. Hypoglycemia occurs with blood glucose levels of 70 mg/dL and below. The risk for hypoglycemia increases when fasting or skipping meals, during or after intense exercise, and during sleep. Hypoglycemia symptoms can include:  Tremors or shakes.  Decreased ability to concentrate.  Sweating.  Increased heart rate.  Headache.  Dry mouth.  Hunger.  Irritability.  Anxiety.  Restless sleep.  Altered speech or coordination.  Confusion.  Treat hypoglycemia promptly. If you are alert and able to safely swallow, follow the 15:15 rule:  Take 15-20 grams of rapid-acting glucose or carbohydrate. Rapid-acting options include glucose gel, glucose tablets, or 4 ounces (120 mL) of fruit juice, regular soda, or low-fat milk.  Check your blood glucose level 15 minutes after taking the glucose.  Take 15-20 grams more of glucose if the repeat blood glucose level is still 70 mg/dL or below.  Eat a meal or snack within 1 hour once blood glucose levels return to normal.  Be alert to feeling very thirsty and urinating more frequently than usual, which are early signs of hyperglycemia. An early awareness of hyperglycemia allows for prompt treatment. Treat hyperglycemia as directed by your health care provider.  Engage in at least 150 minutes of moderate-intensity physical activity a week, spread over at least 3 days of the week or as directed by your health care provider. In addition, you should engage in resistance exercise at least 2 times a week or as directed by your health care provider. Try to spend no more than 90 minutes at one time inactive.  Adjust your medicine and food intake as needed if you start a new exercise or sport.  Follow your sick-day plan anytime you are unable to eat or drink as usual.  Do not use  any tobacco products including cigarettes, chewing tobacco, or electronic cigarettes. If you need help quitting, ask your health care provider.  Limit alcohol intake to no more than 1 drink per day for nonpregnant women and 2 drinks per day for men. You should drink alcohol only when you are also eating food. Talk with your health care provider whether alcohol is safe for you. Tell your health care provider if you drink alcohol several times a week.  Keep all follow-up visits as directed by your health care provider. This is important.  Schedule an eye exam soon after the diagnosis of type 2 diabetes and then annually.  Perform daily skin and foot care. Examine your skin and feet daily for cuts, bruises, redness, nail problems, bleeding, blisters, or sores. A foot exam by a health care provider should be done annually.  Brush your teeth and gums at least twice a day and floss at least once a day. Follow up with your dentist regularly.  Share your diabetes management plan with your workplace or school.  Keep your immunizations up to date. It is recommended that you receive a flu (influenza) vaccine every year. It is also recommended that you receive a pneumonia (pneumococcal) vaccine. If you are 54 years of age or older and have never received a pneumonia vaccine, this vaccine may be given as a series of two separate shots. Ask your health care provider which additional vaccines may be recommended.  Learn to manage stress.  Obtain ongoing diabetes education and support as needed.  Participate in or seek rehabilitation as needed to maintain or improve independence and quality of life. Request a physical or occupational therapy referral if you are having foot or hand numbness, or difficulties with grooming, dressing, eating, or physical activity. SEEK MEDICAL CARE IF:   You are unable to eat food or drink fluids for more than 6 hours.  You have nausea and vomiting for more than 6 hours.  Your  blood glucose level is over 240 mg/dL.  There is a change in mental status.  You develop an additional serious illness.  You have diarrhea for more than 6 hours.  You have been sick or have had a fever for a couple of days and are not getting better.  You have pain during any physical activity.  SEEK IMMEDIATE MEDICAL CARE IF:  You have difficulty breathing.  You have moderate to large ketone levels.   This information is not intended to replace advice given to you by your health care provider. Make sure you discuss any questions you have with your health care provider.   Document Released: 01/24/2005 Document Revised: 10/15/2014 Document Reviewed: 08/23/2011 Elsevier Interactive Patient Education [employer].

## 2015-03-30 NOTE — Assessment & Plan Note (Addendum)
Hgb A1C is 13.1.  Microalbumin is negative.  Will work with the patient to cut out all sugar. Discussed processed food. Refer to lifestyle center.  Start Metformin 2,000 mgs daily

## 2015-03-30 NOTE — Progress Notes (Signed)
BP 112/72 mmHg  Pulse 102  Temp(Src) 97.9 F (36.6 C)  Ht 5\' 4"  (1.626 m)  Wt 191 lb 6.4 oz (86.818 kg)  BMI 32.84 kg/m2  SpO2 95%  LMP 03/29/2015 (Exact Date)   Subjective:    Patient ID: Carolyn Roy, female    DOB: 27-Jan-1966, 50 y.o.   MRN: [id number]  HPI: Carolyn Roy is a 50 y.o. female  Chief Complaint  Patient presents with  . Establish Care    pt states she had a health screening at work and her A1C was 12.9  . Generalized Body Aches    pt states she has joint aches every day  this patient has not been seen by a medical professional for quite some time due to lacking health insurance.  She states her brother has diabetes.    Diabetes: See above.  States since her test, she has cut back to 3 cans of soft drinks a day.   No hypoglycemic episodes No hyperglycemic episodes: vision blurred and migraines Feet problems: numbness comes and goes Blood Sugars averaging: Does not check eye exam within last year: yes and due again April  Depression This has been going on for a period of time.  She has never thought about killing herself Depression screen [facility] 2/9 03/30/2015  Decreased Interest 3  Down, Depressed, Hopeless 3  PHQ - 2 Score 6  Altered sleeping 3  Tired, decreased energy 3  Change in appetite 3  Feeling bad or failure about yourself  3  Trouble concentrating 1  Moving slowly or fidgety/restless 2  Suicidal thoughts 2  PHQ-9 Score 23    States Cholesterol was "borderline"  Family History  Problem Relation Age of Onset  . Diabetes Brother   . Pancreatitis Brother   . Cancer Maternal Grandmother     breast and lung   . Past Medical History  Diagnosis Date  . Endometriosis     Past Surgical History  Procedure Laterality Date  . Oophorectomy    . Tubal ligation        Relevant past medical, surgical, family and social history reviewed and updated as indicated. Interim medical history since our last visit reviewed. Allergies and  medications reviewed and updated.  Review of Systems  Constitutional: Positive for fatigue and unexpected weight change.  HENT: Positive for sore throat.   Eyes: Negative.   Respiratory: Negative.   Cardiovascular: Negative.   Gastrointestinal: Negative.   Endocrine: Positive for polydipsia and polyphagia.  Genitourinary: Positive for enuresis.       Polyuria  Skin: Negative.   Psychiatric/Behavioral:       Depression    Per HPI unless specifically indicated above     Objective:    BP 112/72 mmHg  Pulse 102  Temp(Src) 97.9 F (36.6 C)  Ht 5\' 4"  (1.626 m)  Wt 191 lb 6.4 oz (86.818 kg)  BMI 32.84 kg/m2  SpO2 95%  LMP 03/29/2015 (Exact Date)  Wt Readings from Last 3 Encounters:  03/30/15 191 lb 6.4 oz (86.818 kg)    Physical Exam  Constitutional: She is oriented to person, place, and time. She appears well-developed and well-nourished. No distress.  HENT:  Head: Normocephalic and atraumatic.  Eyes: Conjunctivae and lids are normal. Right eye exhibits no discharge. Left eye exhibits no discharge. No scleral icterus.  Cardiovascular: Normal rate, regular rhythm and normal heart sounds.   Pulmonary/Chest: Effort normal and breath sounds normal.  Abdominal: Soft. Normal appearance and bowel sounds  are normal. There is no splenomegaly or hepatomegaly.  Musculoskeletal: Normal range of motion.  Neurological: She is alert and oriented to person, place, and time.  Skin: Skin is intact. No rash noted. No pallor.  Psychiatric: She has a normal mood and affect. Her behavior is normal. Judgment and thought content normal.   Hgb A1C is 13.1 microalbumin is negative  No results found for this or any previous visit.    Assessment & Plan:   Problem List Items Addressed This Visit      Unprioritized   Diabetes mellitus type 2, noninsulin dependent ([place])    Hgb A1C is 13.1.  Microalbumin is negative.  Will work with the patient to cut out all sugar. Discussed processed food.  Refer to lifestyle center.  Start Metformin 2,000 mgs daily       Relevant Medications   metFORMIN (GLUCOPHAGE) 500 MG tablet   Other Relevant Orders   Bayer DCA Hb A1c Waived   Lipid Panel Piccolo, Waived   Microalbumin, Urine Waived   Uric acid   Comprehensive metabolic panel   Ambulatory referral to diabetic education   Chronic fatigue   Relevant Orders   TSH   CBC   Severe depression    Noted.  Treat DM first and will monitor depression.  She does not want treatment at this time      RESOLVED: Need for diphtheria-tetanus-pertussis (Tdap) vaccine, adult/adolescent - Primary   Relevant Orders   Tdap vaccine greater than or equal to 7yo IM (Completed)       Follow up plan: Return in about 2 weeks (around 04/13/2015).

## 2015-03-30 NOTE — Assessment & Plan Note (Signed)
Noted.  Treat DM first and will monitor depression.  She does not want treatment at this time

## 2015-03-31 LAB — TSH: TSH: 2.1 u[IU]/mL (ref 0.450–4.500)

## 2015-03-31 LAB — CBC
Hematocrit: 44.2 % (ref 34.0–46.6)
Hemoglobin: 14.8 g/dL (ref 11.1–15.9)
MCH: 31.4 pg (ref 26.6–33.0)
MCHC: 33.5 g/dL (ref 31.5–35.7)
MCV: 94 fL (ref 79–97)
Platelets: 255 10*3/uL (ref 150–379)
RBC: 4.72 x10E6/uL (ref 3.77–5.28)
RDW: 12.5 % (ref 12.3–15.4)
WBC: 13.1 10*3/uL — ABNORMAL HIGH (ref 3.4–10.8)

## 2015-03-31 LAB — COMPREHENSIVE METABOLIC PANEL
ALT: 13 IU/L (ref 0–32)
AST: 10 IU/L (ref 0–40)
Albumin/Globulin Ratio: 1.5 (ref 1.1–2.5)
Albumin: 3.8 g/dL (ref 3.5–5.5)
Alkaline Phosphatase: 134 IU/L — ABNORMAL HIGH (ref 39–117)
BUN/Creatinine Ratio: 13 (ref 9–23)
BUN: 7 mg/dL (ref 6–24)
Bilirubin Total: 0.3 mg/dL (ref 0.0–1.2)
CO2: 25 mmol/L (ref 18–29)
Calcium: 9.6 mg/dL (ref 8.7–10.2)
Chloride: 97 mmol/L (ref 96–106)
Creatinine, Ser: 0.52 mg/dL — ABNORMAL LOW (ref 0.57–1.00)
GFR calc Af Amer: 132 mL/min/{1.73_m2} (ref >59)
GFR calc non Af Amer: 114 mL/min/{1.73_m2} (ref >59)
Globulin, Total: 2.5 g/dL (ref 1.5–4.5)
Glucose: 354 mg/dL — ABNORMAL HIGH (ref 65–99)
Potassium: 4.3 mmol/L (ref 3.5–5.2)
Sodium: 138 mmol/L (ref 134–144)
Total Protein: 6.3 g/dL (ref 6.0–8.5)

## 2015-03-31 LAB — URIC ACID: Uric Acid: 3.3 mg/dL (ref 2.5–7.1)

## 2015-04-14 ENCOUNTER — Encounter: Payer: Self-pay | Admitting: Unknown Physician Specialty

## 2015-04-14 ENCOUNTER — Ambulatory Visit (INDEPENDENT_AMBULATORY_CARE_PROVIDER_SITE_OTHER): Payer: Managed Care, Other (non HMO) | Admitting: Unknown Physician Specialty

## 2015-04-14 VITALS — BP 108/74 | HR 79 | Temp 97.5°F | Ht 63.6 in | Wt 192.0 lb

## 2015-04-14 DIAGNOSIS — E119 Type 2 diabetes mellitus without complications: Secondary | ICD-10-CM

## 2015-04-14 MED ORDER — EMPAGLIFLOZIN 10 MG PO TABS: 10.0000 mg | ORAL_TABLET | Freq: Every day | ORAL | Status: DC

## 2015-04-14 NOTE — Progress Notes (Signed)
BP 108/74 mmHg  Pulse 79  Temp(Src) 97.5 F (36.4 C)  Ht 5' 3.6" (1.615 m)  Wt 192 lb (87.091 kg)  BMI 33.39 kg/m2  SpO2 95%  LMP 03/29/2015 (Exact Date)   Subjective:    Patient ID: Carolyn Roy, female    DOB: [birth year], 50 y.o.   MRN: [id number]  HPI: Carolyn Roy is a 50 y.o. female  Chief Complaint  Patient presents with  . Diabetes   Pt states she feels "1000 percent better since last time I was here."  Blood sugar has come down and checking 4 times/day.  She has completely given up sodas and sweet tea.  She gave up bread and potatoes also.  BS is ranging from 166 to 300.  The lower numbers are in the AM and higher numbers in the evening.    Relevant past medical, surgical, family and social history reviewed and updated as indicated. Interim medical history since our last visit reviewed. Allergies and medications reviewed and updated.  Review of Systems  Per HPI unless specifically indicated above     Objective:    BP 108/74 mmHg  Pulse 79  Temp(Src) 97.5 F (36.4 C)  Ht 5' 3.6" (1.615 m)  Wt 192 lb (87.091 kg)  BMI 33.39 kg/m2  SpO2 95%  LMP 03/29/2015 (Exact Date)  Wt Readings from Last 3 Encounters:  04/14/15 192 lb (87.091 kg)  03/30/15 191 lb 6.4 oz (86.818 kg)    Physical Exam  Constitutional: She is oriented to person, place, and time. She appears well-developed and well-nourished. No distress.  HENT:  Head: Normocephalic and atraumatic.  Eyes: Conjunctivae and lids are normal. Right eye exhibits no discharge. Left eye exhibits no discharge. No scleral icterus.  Neck: Normal range of motion. Neck supple. No JVD present. Carotid bruit is not present.  Cardiovascular: Normal rate, regular rhythm and normal heart sounds.   Pulmonary/Chest: Effort normal and breath sounds normal.  Abdominal: Normal appearance. There is no splenomegaly or hepatomegaly.  Musculoskeletal: Normal range of motion.  Neurological: She is alert and oriented to  person, place, and time.  Skin: Skin is warm, dry and intact. No rash noted. No pallor.  Psychiatric: She has a normal mood and affect. Her behavior is normal. Judgment and thought content normal.    Results for orders placed or performed in visit on 03/30/15  Bayer DCA Hb A1c Waived  Result Value Ref Range   Bayer DCA Hb A1c Waived 13.1 (H) <7.0 %  Lipid Panel Piccolo, Waived  Result Value Ref Range   Cholesterol Piccolo, Waived 210 (H) <200 mg/dL   HDL Chol Piccolo, Waived 55 (L) >59 mg/dL   Triglycerides Piccolo,Waived 287 (H) <150 mg/dL   Chol/HDL Ratio Piccolo,Waive 3.8 mg/dL   LDL Chol Calc Piccolo Waived 98 <100 mg/dL   VLDL Chol Calc Piccolo,Waive 57 (H) <30 mg/dL  Microalbumin, Urine Waived  Result Value Ref Range   Microalb, Ur Waived 10 0 - 19 mg/L   Creatinine, Urine Waived 50 10 - 300 mg/dL   Microalb/Creat Ratio 30-300 (H) <30 mg/g  Uric acid  Result Value Ref Range   Uric Acid 3.3 2.5 - 7.1 mg/dL  Comprehensive metabolic panel  Result Value Ref Range   Glucose 354 (H) 65 - 99 mg/dL   BUN 7 6 - 24 mg/dL   Creatinine, Ser 0.52 (L) 0.57 - 1.00 mg/dL   GFR calc non Af Amer 114 >59 mL/min/1.73   GFR calc Af Wyvonnia Lora  132 >59 mL/min/1.73   BUN/Creatinine Ratio 13 9 - 23   Sodium 138 134 - 144 mmol/L   Potassium 4.3 3.5 - 5.2 mmol/L   Chloride 97 96 - 106 mmol/L   CO2 25 18 - 29 mmol/L   Calcium 9.6 8.7 - 10.2 mg/dL   Total Protein 6.3 6.0 - 8.5 g/dL   Albumin 3.8 3.5 - 5.5 g/dL   Globulin, Total 2.5 1.5 - 4.5 g/dL   Albumin/Globulin Ratio 1.5 1.1 - 2.5   Bilirubin Total 0.3 0.0 - 1.2 mg/dL   Alkaline Phosphatase 134 (H) 39 - 117 IU/L   AST 10 0 - 40 IU/L   ALT 13 0 - 32 IU/L  TSH  Result Value Ref Range   TSH 2.100 0.450 - 4.500 uIU/mL  CBC  Result Value Ref Range   WBC 13.1 (H) 3.4 - 10.8 x10E3/uL   RBC 4.72 3.77 - 5.28 x10E6/uL   Hemoglobin 14.8 11.1 - 15.9 g/dL   Hematocrit 44.2 34.0 - 46.6 %   MCV 94 79 - 97 fL   MCH 31.4 26.6 - 33.0 pg   MCHC 33.5  31.5 - 35.7 g/dL   RDW 12.5 12.3 - 15.4 %   Platelets 255 150 - 379 x10E3/uL      Assessment & Plan:   Problem List Items Addressed This Visit      Unprioritized   Diabetes mellitus type 2, noninsulin dependent ([place]) - Primary    Blood sugar improved with diet changes and 2 Grams Metformin a day.  Still high particularly in the 300s before.  Start Jardiance 10 mg.day.        Relevant Medications   empagliflozin (JARDIANCE) 10 MG TABS tablet       Follow up plan: Return in about 4 weeks (around 05/12/2015).

## 2015-04-14 NOTE — Assessment & Plan Note (Addendum)
Blood sugar improved with diet changes and 2 Grams Metformin a day.  Still high particularly in the 300s before.  Start Jardiance 10 mg.day.

## 2015-04-30 LAB — HM DIABETES EYE EXAM

## 2015-05-19 ENCOUNTER — Telehealth: Payer: Self-pay | Admitting: Unknown Physician Specialty

## 2015-05-19 NOTE — Telephone Encounter (Signed)
Pt called needs a refill on Jardiance. [facility] [place]. Pt had to reschedule appt until 05/29/15. Thanks.

## 2015-05-19 NOTE — Telephone Encounter (Signed)
Malachy Mood wrote rx on 04/14/15 for #30 with 2 refills. So I called the pharmacy and made sure that the patient still had 2 refills and they said yes. So I then called the patient and let her know that she still has 2 refills left on this medication.

## 2015-05-20 ENCOUNTER — Ambulatory Visit: Payer: Managed Care, Other (non HMO) | Admitting: Unknown Physician Specialty

## 2015-05-26 ENCOUNTER — Other Ambulatory Visit: Payer: Self-pay | Admitting: Unknown Physician Specialty

## 2015-05-29 ENCOUNTER — Encounter: Payer: Self-pay | Admitting: Unknown Physician Specialty

## 2015-05-29 ENCOUNTER — Ambulatory Visit (INDEPENDENT_AMBULATORY_CARE_PROVIDER_SITE_OTHER): Payer: Managed Care, Other (non HMO) | Admitting: Unknown Physician Specialty

## 2015-05-29 ENCOUNTER — Other Ambulatory Visit: Payer: Self-pay | Admitting: Unknown Physician Specialty

## 2015-05-29 VITALS — BP 106/71 | HR 91 | Temp 99.1°F | Ht 63.5 in | Wt 190.4 lb

## 2015-05-29 DIAGNOSIS — M25562 Pain in left knee: Secondary | ICD-10-CM | POA: Insufficient documentation

## 2015-05-29 DIAGNOSIS — Z0000 Encounter for general adult medical examination without abnormal findings: Secondary | ICD-10-CM | POA: Diagnosis not present

## 2015-05-29 DIAGNOSIS — E119 Type 2 diabetes mellitus without complications: Secondary | ICD-10-CM

## 2015-05-29 MED ORDER — ONETOUCH ULTRA BLUE VI STRP
1.0000 | ORAL_STRIP | Freq: Four times a day (QID) | Status: DC
Start: 2015-05-29 — End: 2015-05-29

## 2015-05-29 MED ORDER — ONETOUCH DELICA LANCETS 33G MISC: 1.0000 [IU] | Freq: Four times a day (QID) | Status: DC

## 2015-05-29 NOTE — Progress Notes (Signed)
   BP 106/71 mmHg  Pulse 91  Temp(Src) 99.1 F (37.3 C)  Ht 5' 3.5" (1.613 m)  Wt 190 lb 6.4 oz (86.365 kg)  BMI 33.19 kg/m2  SpO2 95%  LMP  (LMP Unknown)   Subjective:    Patient ID: Carolyn Roy, female    DOB: [birth year], 50 y.o.   MRN: [id number]  HPI: Carolyn BRAGDON is a 50 y.o. female  Chief Complaint  Patient presents with  . Diabetes    pt is due for foot exam  . Labs Only    pt is interested in HIV screening, order placed   Diabetes:  States her sugars are a little high this week but tolerating Jardiance OK.   Using medications without difficulties No hypoglycemic episodes No hyperglycemic episodes Feet problems: none Blood Sugars averaging: 150-220  Knee pain Pt with left medial knee pain that throbs at times.  Hurts with standing on concreted 8 hours/day.    Relevant past medical, surgical, family and social history reviewed and updated as indicated. Interim medical history since our last visit reviewed. Allergies and medications reviewed and updated.  Review of Systems  Per HPI unless specifically indicated above     Objective:    BP 106/71 mmHg  Pulse 91  Temp(Src) 99.1 F (37.3 C)  Ht 5' 3.5" (1.613 m)  Wt 190 lb 6.4 oz (86.365 kg)  BMI 33.19 kg/m2  SpO2 95%  LMP  (LMP Unknown)  Wt Readings from Last 3 Encounters:  05/29/15 190 lb 6.4 oz (86.365 kg)  04/14/15 192 lb (87.091 kg)  03/30/15 191 lb 6.4 oz (86.818 kg)    Physical Exam  Constitutional: She is oriented to person, place, and time. She appears well-developed and well-nourished. No distress.  HENT:  Head: Normocephalic and atraumatic.  Eyes: Conjunctivae and lids are normal. Right eye exhibits no discharge. Left eye exhibits no discharge. No scleral icterus.  Neck: Normal range of motion. Neck supple. No JVD present. Carotid bruit is not present.  Cardiovascular: Normal rate, regular rhythm and normal heart sounds.   Pulmonary/Chest: Effort normal and breath sounds normal.   Abdominal: Normal appearance. There is no splenomegaly or hepatomegaly.  Musculoskeletal: Normal range of motion.  Neurological: She is alert and oriented to person, place, and time.  Skin: Skin is warm, dry and intact. No rash noted. No pallor.  Psychiatric: She has a normal mood and affect. Her behavior is normal. Judgment and thought content normal.     Assessment & Plan:   Problem List Items Addressed This Visit      Unprioritized   Diabetes mellitus type 2, noninsulin dependent ([place])    Restart Jardiance.  Recheck Hgb A1C next month      Knee pain, left    Use brace.  OTC Tylenol or Ibuprofen       Other Visit Diagnoses    Health care maintenance    -  Primary    Relevant Orders    HIV antibody        Follow up plan: Return in about 4 weeks (around 06/26/2015).

## 2015-05-29 NOTE — Assessment & Plan Note (Signed)
Restart Jardiance.  Recheck Hgb A1C next month

## 2015-05-29 NOTE — Assessment & Plan Note (Signed)
Use brace.  OTC Tylenol or Ibuprofen

## 2015-06-26 ENCOUNTER — Ambulatory Visit: Payer: Managed Care, Other (non HMO) | Admitting: Unknown Physician Specialty

## 2015-07-01 ENCOUNTER — Ambulatory Visit (INDEPENDENT_AMBULATORY_CARE_PROVIDER_SITE_OTHER): Payer: Managed Care, Other (non HMO) | Admitting: Unknown Physician Specialty

## 2015-07-01 ENCOUNTER — Encounter: Payer: Self-pay | Admitting: Unknown Physician Specialty

## 2015-07-01 VITALS — BP 105/73 | HR 79 | Temp 97.8°F | Ht 63.5 in | Wt 190.2 lb

## 2015-07-01 DIAGNOSIS — Z0000 Encounter for general adult medical examination without abnormal findings: Secondary | ICD-10-CM | POA: Diagnosis not present

## 2015-07-01 DIAGNOSIS — E119 Type 2 diabetes mellitus without complications: Secondary | ICD-10-CM

## 2015-07-01 LAB — BAYER DCA HB A1C WAIVED: HB A1C (BAYER DCA - WAIVED): 8.3 % — ABNORMAL HIGH (ref <7.0)

## 2015-07-01 NOTE — Assessment & Plan Note (Signed)
Hgb A1C is 8.3 which is much improved.  Discussed goal of 7 and pt thinks she can get there without additional medications.  Will check back in 3 months

## 2015-07-01 NOTE — Progress Notes (Signed)
   BP 105/73 mmHg  Pulse 79  Temp(Src) 97.8 F (36.6 C)  Ht 5' 3.5" (1.613 m)  Wt 190 lb 3.2 oz (86.274 kg)  BMI 33.16 kg/m2  SpO2 95%  LMP  (LMP Unknown)   Subjective:    Patient ID: Carolyn Roy, female    DOB: [birth year], 50 y.o.   MRN: [id number]  HPI: Carolyn Roy is a 50 y.o. female  Chief Complaint  Patient presents with  . Diabetes  . Labs Only    HIV order entered   Diabetes:  tolerating Jardiance OK.   Using medications without difficulties No hypoglycemic episodes No hyperglycemic episodes Feet problems: none Blood Sugars averaging: 150-210 Last Hgb A1C was 13.6  Relevant past medical, surgical, family and social history reviewed and updated as indicated. Interim medical history since our last visit reviewed. Allergies and medications reviewed and updated.  Review of Systems  Per HPI unless specifically indicated above     Objective:    BP 105/73 mmHg  Pulse 79  Temp(Src) 97.8 F (36.6 C)  Ht 5' 3.5" (1.613 m)  Wt 190 lb 3.2 oz (86.274 kg)  BMI 33.16 kg/m2  SpO2 95%  LMP  (LMP Unknown)  Wt Readings from Last 3 Encounters:  07/01/15 190 lb 3.2 oz (86.274 kg)  05/29/15 190 lb 6.4 oz (86.365 kg)  04/14/15 192 lb (87.091 kg)    Physical Exam  Constitutional: She is oriented to person, place, and time. She appears well-developed and well-nourished. No distress.  HENT:  Head: Normocephalic and atraumatic.  Eyes: Conjunctivae and lids are normal. Right eye exhibits no discharge. Left eye exhibits no discharge. No scleral icterus.  Neck: Normal range of motion. Neck supple. No JVD present. Carotid bruit is not present.  Cardiovascular: Normal rate, regular rhythm and normal heart sounds.   Pulmonary/Chest: Effort normal and breath sounds normal.  Abdominal: Normal appearance. There is no splenomegaly or hepatomegaly.  Musculoskeletal: Normal range of motion.  Neurological: She is alert and oriented to person, place, and time.  Skin: Skin  is warm, dry and intact. No rash noted. No pallor.  Psychiatric: She has a normal mood and affect. Her behavior is normal. Judgment and thought content normal.     Assessment & Plan:   Problem List Items Addressed This Visit      Unprioritized   Diabetes mellitus type 2, noninsulin dependent ([place])    Hgb A1C is 8.3 which is much improved.  Discussed goal of 7 and pt thinks she can get there without additional medications.  Will check back in 3 months      Relevant Orders   Bayer DCA Hb A1c Waived   Comprehensive metabolic panel    Other Visit Diagnoses    Health care maintenance    -  Primary    Relevant Orders    HIV antibody        Follow up plan: Return in about 3 months (around 10/01/2015).

## 2015-07-02 LAB — COMPREHENSIVE METABOLIC PANEL
ALT: 15 IU/L (ref 0–32)
AST: 11 IU/L (ref 0–40)
Albumin/Globulin Ratio: 1.9 (ref 1.2–2.2)
Albumin: 4.2 g/dL (ref 3.5–5.5)
Alkaline Phosphatase: 91 IU/L (ref 39–117)
BUN/Creatinine Ratio: 17 (ref 9–23)
BUN: 9 mg/dL (ref 6–24)
Bilirubin Total: 0.3 mg/dL (ref 0.0–1.2)
CO2: 22 mmol/L (ref 18–29)
Calcium: 9.2 mg/dL (ref 8.7–10.2)
Chloride: 100 mmol/L (ref 96–106)
Creatinine, Ser: 0.54 mg/dL — ABNORMAL LOW (ref 0.57–1.00)
GFR calc Af Amer: 130 mL/min/{1.73_m2} (ref >59)
GFR calc non Af Amer: 113 mL/min/{1.73_m2} (ref >59)
Globulin, Total: 2.2 g/dL (ref 1.5–4.5)
Glucose: 145 mg/dL — ABNORMAL HIGH (ref 65–99)
Potassium: 4.2 mmol/L (ref 3.5–5.2)
Sodium: 142 mmol/L (ref 134–144)
Total Protein: 6.4 g/dL (ref 6.0–8.5)

## 2015-07-02 LAB — HIV ANTIBODY (ROUTINE TESTING W REFLEX): HIV Screen 4th Generation wRfx: NONREACTIVE

## 2015-07-23 ENCOUNTER — Other Ambulatory Visit: Payer: Self-pay | Admitting: Unknown Physician Specialty

## 2015-08-21 ENCOUNTER — Other Ambulatory Visit: Payer: Self-pay | Admitting: Obstetrics and Gynecology

## 2015-08-21 DIAGNOSIS — R928 Other abnormal and inconclusive findings on diagnostic imaging of breast: Secondary | ICD-10-CM

## 2016-08-22 LAB — HM DIABETES EYE EXAM

## 2016-11-15 ENCOUNTER — Ambulatory Visit
Admission: RE | Admit: 2016-11-15 | Discharge: 2016-11-15 | Disposition: A | Discharge status: Home / Self Care | Payer: Managed Care, Other (non HMO) | Source: Ambulatory Visit | Attending: Unknown Physician Specialty | Admitting: Unknown Physician Specialty

## 2016-11-15 ENCOUNTER — Other Ambulatory Visit: Payer: Self-pay | Admitting: Unknown Physician Specialty

## 2016-11-15 ENCOUNTER — Encounter: Payer: Self-pay | Admitting: Unknown Physician Specialty

## 2016-11-15 ENCOUNTER — Ambulatory Visit (INDEPENDENT_AMBULATORY_CARE_PROVIDER_SITE_OTHER): Payer: Managed Care, Other (non HMO) | Admitting: Unknown Physician Specialty

## 2016-11-15 VITALS — BP 104/69 | HR 82 | Temp 98.3°F | Ht 64.2 in | Wt 183.5 lb

## 2016-11-15 DIAGNOSIS — R05 Cough: Secondary | ICD-10-CM

## 2016-11-15 DIAGNOSIS — E1165 Type 2 diabetes mellitus with hyperglycemia: Secondary | ICD-10-CM

## 2016-11-15 DIAGNOSIS — J40 Bronchitis, not specified as acute or chronic: Secondary | ICD-10-CM | POA: Insufficient documentation

## 2016-11-15 DIAGNOSIS — F17219 Nicotine dependence, cigarettes, with unspecified nicotine-induced disorders: Secondary | ICD-10-CM | POA: Insufficient documentation

## 2016-11-15 DIAGNOSIS — F322 Major depressive disorder, single episode, severe without psychotic features: Secondary | ICD-10-CM

## 2016-11-15 DIAGNOSIS — R059 Cough, unspecified: Secondary | ICD-10-CM

## 2016-11-15 DIAGNOSIS — Z720 Tobacco use: Secondary | ICD-10-CM

## 2016-11-15 DIAGNOSIS — E119 Type 2 diabetes mellitus without complications: Secondary | ICD-10-CM | POA: Diagnosis not present

## 2016-11-15 LAB — MICROALBUMIN, URINE WAIVED
Creatinine, Urine Waived: 50 mg/dL (ref 10–300)
Microalb, Ur Waived: 10 mg/L (ref 0–19)
Microalb/Creat Ratio: <30 mg/g (ref <30)

## 2016-11-15 LAB — LIPID PANEL PICCOLO, WAIVED

## 2016-11-15 MED ORDER — ALBUTEROL SULFATE HFA 108 (90 BASE) MCG/ACT IN AERS: 2.0000 | INHALATION_SPRAY | Freq: Four times a day (QID) | RESPIRATORY_TRACT | 0 refills | Status: DC | PRN

## 2016-11-15 MED ORDER — VENLAFAXINE HCL ER 37.5 MG PO CP24: 37.5000 mg | ORAL_CAPSULE | Freq: Every day | ORAL | 1 refills | Status: DC

## 2016-11-15 MED ORDER — CANAGLIFLOZIN 100 MG PO TABS: 100.0000 mg | ORAL_TABLET | Freq: Every day | ORAL | 2 refills | Status: DC

## 2016-11-15 NOTE — Assessment & Plan Note (Signed)
Restart medications.  Will rx Invokanna instead of Jardiance due to insurance issues

## 2016-11-15 NOTE — Assessment & Plan Note (Signed)
Plans to try to quit through a work based program

## 2016-11-15 NOTE — Progress Notes (Signed)
BP 104/69   Pulse 82   Temp 98.3 F (36.8 C)   Ht 5' 4.2" (1.631 m)   Wt 183 lb 8 oz (83.2 kg)   LMP  (LMP Unknown)   SpO2 98%   BMI 31.30 kg/m    Subjective:    Patient ID: Carolyn Roy, female    DOB: 29-Mar-1965, 51 y.o.   MRN: [id number]  HPI: Carolyn Roy is a 51 y.o. female  Chief Complaint  Patient presents with  . Diabetes    pt states she had recent eye exam, will fax form to Dr. Ellin Mayhew   . Headache    pt states she has been having very bad headaches for the last 3 weeks    Diabetes: Out of diabetic medications for "quite a while" due to losing her insurance.   Last time she was off her medications her Hgb A1C was 13.6. She states Jardiance is not on her formulary No hypoglycemic episodes No hyperglycemic episodes Feet problems:  Blood Sugars averaging: eye exam within last year Last Hgb A1C: 8.3  Tobacco Smokes less than a pack/day.  Thought about quitting smoking  Depression States she is under a lot of stress between home, work, and stepfather having Alzheimer's    Depression screen [facility] 2/9 11/15/2016 03/30/2015  Decreased Interest 2 3  Down, Depressed, Hopeless 3 3  PHQ - 2 Score 5 6  Altered sleeping 3 3  Tired, decreased energy 2 3  Change in appetite 3 3  Feeling bad or failure about yourself  2 3  Trouble concentrating 0 1  Moving slowly or fidgety/restless 0 2  Suicidal thoughts 1 2  PHQ-9 Score 16 23     Relevant past medical, surgical, family and social history reviewed and updated as indicated. Interim medical history since our last visit reviewed. Allergies and medications reviewed and updated.  Review of Systems  Respiratory: Positive for cough.        Persistent cough.  Took Amoxil without help    Per HPI unless specifically indicated above     Objective:    BP 104/69   Pulse 82   Temp 98.3 F (36.8 C)   Ht 5' 4.2" (1.631 m)   Wt 183 lb 8 oz (83.2 kg)   LMP  (LMP Unknown)   SpO2 98%   BMI 31.30 kg/m   Wt  Readings from Last 3 Encounters:  11/15/16 183 lb 8 oz (83.2 kg)  07/01/15 190 lb 3.2 oz (86.3 kg)  05/29/15 190 lb 6.4 oz (86.4 kg)    Physical Exam  Constitutional: She is oriented to person, place, and time. She appears well-developed and well-nourished. No distress.  HENT:  Head: Normocephalic and atraumatic.  Eyes: Conjunctivae and lids are normal. Right eye exhibits no discharge. Left eye exhibits no discharge. No scleral icterus.  Neck: Normal range of motion. Neck supple. No JVD present. Carotid bruit is not present.  Cardiovascular: Normal rate, regular rhythm and normal heart sounds.   Pulmonary/Chest: Effort normal and breath sounds normal.  Abdominal: Normal appearance. There is no splenomegaly or hepatomegaly.  Musculoskeletal: Normal range of motion.  Neurological: She is alert and oriented to person, place, and time.  Skin: Skin is warm, dry and intact. No rash noted. No pallor.  Psychiatric: She has a normal mood and affect. Her behavior is normal. Judgment and thought content normal.     Assessment & Plan:   Problem List Items Addressed This Visit  Unprioritized   Depression, major, single episode, severe ([place])    Start Effexor which has helped in the past      Relevant Medications   venlafaxine XR (EFFEXOR-XR) 37.5 MG 24 hr capsule   Poorly controlled type 2 diabetes mellitus ([place]) - Primary    Restart medications.  Will rx Invokanna instead of Jardiance due to insurance issues      Relevant Medications   canagliflozin (INVOKANA) 100 MG TABS tablet   Tobacco abuse    Plans to try to quit through a work based program       Other Visit Diagnoses    Cough       Chest x-ray and Albuterol prn wheeze   Relevant Orders   DG Chest 2 View       Follow up plan: Return in about 4 weeks (around 12/13/2016).

## 2016-11-15 NOTE — Assessment & Plan Note (Signed)
Start Effexor which has helped in the past

## 2016-11-16 LAB — COMPREHENSIVE METABOLIC PANEL
ALT: 10 IU/L (ref 0–32)
AST: 11 IU/L (ref 0–40)
Albumin/Globulin Ratio: 1.4 (ref 1.2–2.2)
Albumin: 3.8 g/dL (ref 3.5–5.5)
Alkaline Phosphatase: 131 IU/L — ABNORMAL HIGH (ref 39–117)
BUN/Creatinine Ratio: 11 (ref 9–23)
BUN: 7 mg/dL (ref 6–24)
Bilirubin Total: 0.3 mg/dL (ref 0.0–1.2)
CO2: 21 mmol/L (ref 20–29)
Calcium: 9.4 mg/dL (ref 8.7–10.2)
Chloride: 100 mmol/L (ref 96–106)
Creatinine, Ser: 0.64 mg/dL (ref 0.57–1.00)
GFR calc Af Amer: 121 mL/min/{1.73_m2} (ref >59)
GFR calc non Af Amer: 105 mL/min/{1.73_m2} (ref >59)
Globulin, Total: 2.7 g/dL (ref 1.5–4.5)
Glucose: 414 mg/dL — ABNORMAL HIGH (ref 65–99)
Potassium: 4.1 mmol/L (ref 3.5–5.2)
Sodium: 137 mmol/L (ref 134–144)
Total Protein: 6.5 g/dL (ref 6.0–8.5)

## 2016-11-16 LAB — LIPID PANEL W/O CHOL/HDL RATIO

## 2016-11-17 LAB — LIPID PANEL W/O CHOL/HDL RATIO
Cholesterol, Total: 196 mg/dL (ref 100–199)
HDL: 38 mg/dL — ABNORMAL LOW (ref >39)
LDL Calculated: 99 mg/dL (ref 0–99)
Triglycerides: 295 mg/dL — ABNORMAL HIGH (ref 0–149)
VLDL Cholesterol Cal: 59 mg/dL — ABNORMAL HIGH (ref 5–40)

## 2016-11-17 LAB — SPECIMEN STATUS REPORT

## 2016-11-21 ENCOUNTER — Other Ambulatory Visit: Payer: Self-pay | Admitting: Unknown Physician Specialty

## 2016-11-21 ENCOUNTER — Other Ambulatory Visit: Payer: Self-pay

## 2016-11-21 ENCOUNTER — Telehealth: Payer: Self-pay

## 2016-11-21 MED ORDER — METFORMIN HCL 500 MG PO TABS: 1000.0000 mg | ORAL_TABLET | Freq: Two times a day (BID) | ORAL | 1 refills | Status: DC

## 2016-11-21 MED ORDER — ATORVASTATIN CALCIUM 20 MG PO TABS: 20.0000 mg | ORAL_TABLET | Freq: Every day | ORAL | 3 refills | Status: DC

## 2016-11-21 NOTE — Telephone Encounter (Signed)
Tried submitting PA for patient's Invokana but patient could not be found. Called patient to get information off of prescription card.  RXBIN- [id number] RXPCN- [id number] Juanna Cao- [id number] ID- [id number]  PA submitted and approved with this information. Patient and pharmacy both notified of approval.

## 2016-11-21 NOTE — Telephone Encounter (Signed)
Patient states that she needs a refill on metformin sent to [facility].

## 2016-11-21 NOTE — Progress Notes (Signed)
Added Atorvastatin 20 mg daily

## 2016-11-23 LAB — BAYER DCA HB A1C WAIVED: HB A1C (BAYER DCA - WAIVED): 11.5 % — ABNORMAL HIGH (ref <7.0)

## 2016-11-28 ENCOUNTER — Telehealth: Payer: Self-pay

## 2016-11-28 NOTE — Telephone Encounter (Signed)
Copied from [place] #[id number]. Topic: Quick Communication - See Telephone Encounter >> Nov 28, 2016  4:13 PM Malena Catholic I, NT wrote: CRM for notification. See Telephone encounter for:  11/28/16. Pt call said she cant not pay for [facility]

## 2016-11-29 MED ORDER — DAPAGLIFLOZIN PROPANEDIOL 5 MG PO TABS: 5.0000 mg | ORAL_TABLET | Freq: Every day | ORAL | 2 refills | Status: DC

## 2016-11-29 NOTE — Telephone Encounter (Signed)
Called and left patient a VM asking for her to please return my call. Need to let her know that Wilder Glade is a preferred medication with her insurance and that it has been sent to her pharmacy for her to take in place of the [place].

## 2016-11-29 NOTE — Telephone Encounter (Signed)
Carolyn Roy written

## 2016-11-29 NOTE — Telephone Encounter (Signed)
Routing to provider. PA for invokana was approved but patient states that she still cannot afford it. When I did the PA, preferred medications were farxiga, jardiance, januvia, or onglyza. Can we send in one of these for the patient instead?

## 2016-11-30 NOTE — Telephone Encounter (Signed)
Called and left patient a VM asking for her to please return my call.  

## 2016-11-30 NOTE — Telephone Encounter (Signed)
Called and spoke to patient. She states that her pharmacy told her about a website that she could go to to help with her Invokana. Patient states that she did this and was able to get her Invokana for free for a year. I told her to please disregard the farxiga RX at the pharmacy. Patient verbalized understanding.

## 2016-12-14 ENCOUNTER — Encounter: Payer: Self-pay | Admitting: Unknown Physician Specialty

## 2016-12-14 ENCOUNTER — Ambulatory Visit: Payer: Managed Care, Other (non HMO) | Admitting: Unknown Physician Specialty

## 2016-12-14 DIAGNOSIS — F322 Major depressive disorder, single episode, severe without psychotic features: Secondary | ICD-10-CM

## 2016-12-14 DIAGNOSIS — E1165 Type 2 diabetes mellitus with hyperglycemia: Secondary | ICD-10-CM | POA: Diagnosis not present

## 2016-12-14 MED ORDER — VENLAFAXINE HCL ER 75 MG PO CP24
75.0000 mg | ORAL_CAPSULE | Freq: Every day | ORAL | 1 refills | Status: DC
Start: 2016-12-14 — End: 2017-06-02

## 2016-12-14 NOTE — Assessment & Plan Note (Signed)
Good response to 37.5.  Will increase to 75 mg

## 2016-12-14 NOTE — Progress Notes (Signed)
BP 110/76   Pulse 90   Temp 98.1 F (36.7 C) (Oral)   Wt 197 lb 6.4 oz (89.5 kg)   LMP  (LMP Unknown)   SpO2 94%   BMI 33.67 kg/m    Subjective:    Patient ID: Carolyn Roy, female    DOB: Apr 13, 1965, 51 y.o.   MRN: [id number]  HPI: Carolyn Roy is a 51 y.o. female  Chief Complaint  Patient presents with  . Depression    4 week f/up  . Diabetes    4 week f/up    Depression Pt is dong better on Effexor with notable improvement in symptoms  Depression screen [facility] 2/9 12/14/2016 11/15/2016 03/30/2015  Decreased Interest 1 2 3   Down, Depressed, Hopeless 1 3 3   PHQ - 2 Score 2 5 6   Altered sleeping 3 3 3   Tired, decreased energy 1 2 3   Change in appetite 1 3 3   Feeling bad or failure about yourself  1 2 3   Trouble concentrating 1 0 1  Moving slowly or fidgety/restless 0 0 2  Suicidal thoughts 0 1 2  PHQ-9 Score 9 16 23    Diabetes Had been out of medications when I saw her last.  Metformin and Invokanna started.  Cut her Metformin down due to abdominal pain.  States sometimes BS is 190's-210 but sometimes less than 70.  Admits diet is not that great. States she is feeling better with less fatigue and joint pain   Relevant past medical, surgical, family and social history reviewed and updated as indicated. Interim medical history since our last visit reviewed. Allergies and medications reviewed and updated.  Review of Systems  Per HPI unless specifically indicated above     Objective:    BP 110/76   Pulse 90   Temp 98.1 F (36.7 C) (Oral)   Wt 197 lb 6.4 oz (89.5 kg)   LMP  (LMP Unknown)   SpO2 94%   BMI 33.67 kg/m   Wt Readings from Last 3 Encounters:  12/14/16 197 lb 6.4 oz (89.5 kg)  11/15/16 183 lb 8 oz (83.2 kg)  07/01/15 190 lb 3.2 oz (86.3 kg)    Physical Exam  Constitutional: She is oriented to person, place, and time. She appears well-developed and well-nourished. No distress.  HENT:  Head: Normocephalic and atraumatic.  Eyes:  Conjunctivae and lids are normal. Right eye exhibits no discharge. Left eye exhibits no discharge. No scleral icterus.  Neck: Normal range of motion. Neck supple. No JVD present. Carotid bruit is not present.  Cardiovascular: Normal rate, regular rhythm and normal heart sounds.  Pulmonary/Chest: Effort normal and breath sounds normal.  Abdominal: Normal appearance. There is no splenomegaly or hepatomegaly.  Musculoskeletal: Normal range of motion.  Neurological: She is alert and oriented to person, place, and time.  Skin: Skin is warm, dry and intact. No rash noted. No pallor.  Psychiatric: She has a normal mood and affect. Her behavior is normal. Judgment and thought content normal.    Results for orders placed or performed in visit on 11/15/16  Lipid Panel w/o Chol/HDL Ratio  Result Value Ref Range   Cholesterol, Total CANCELED mg/dL   Triglycerides CANCELED    HDL CANCELED       Assessment & Plan:   Problem List Items Addressed This Visit      Unprioritized   Depression, major, single episode, severe ([place])    Good response to 37.5.  Will increase to 75 mg  Relevant Medications   venlafaxine XR (EFFEXOR-XR) 75 MG 24 hr capsule   Poorly controlled type 2 diabetes mellitus ([place])    Improved with restarting medications.  Recheck in 2 months          Follow up plan: Return in about 2 months (around 02/13/2017).

## 2016-12-14 NOTE — Assessment & Plan Note (Signed)
Improved with restarting medications.  Recheck in 2 months

## 2017-01-22 ENCOUNTER — Other Ambulatory Visit: Payer: Self-pay | Admitting: Unknown Physician Specialty

## 2017-02-13 ENCOUNTER — Ambulatory Visit: Payer: Managed Care, Other (non HMO) | Admitting: Unknown Physician Specialty

## 2017-02-24 ENCOUNTER — Other Ambulatory Visit: Payer: Self-pay | Admitting: Unknown Physician Specialty

## 2017-03-03 ENCOUNTER — Encounter: Payer: Self-pay | Admitting: Unknown Physician Specialty

## 2017-03-03 ENCOUNTER — Ambulatory Visit: Payer: Managed Care, Other (non HMO) | Admitting: Unknown Physician Specialty

## 2017-03-03 VITALS — BP 103/68 | HR 97 | Temp 97.7°F | Wt 176.9 lb

## 2017-03-03 DIAGNOSIS — F322 Major depressive disorder, single episode, severe without psychotic features: Secondary | ICD-10-CM

## 2017-03-03 DIAGNOSIS — E7800 Pure hypercholesterolemia, unspecified: Secondary | ICD-10-CM | POA: Diagnosis not present

## 2017-03-03 DIAGNOSIS — E1165 Type 2 diabetes mellitus with hyperglycemia: Secondary | ICD-10-CM

## 2017-03-03 DIAGNOSIS — E1169 Type 2 diabetes mellitus with other specified complication: Secondary | ICD-10-CM | POA: Insufficient documentation

## 2017-03-03 DIAGNOSIS — E785 Hyperlipidemia, unspecified: Secondary | ICD-10-CM | POA: Insufficient documentation

## 2017-03-03 LAB — UA/M W/RFLX CULTURE, ROUTINE
Bilirubin, UA: NEGATIVE
Ketones, UA: NEGATIVE
Leukocytes, UA: NEGATIVE
Nitrite, UA: NEGATIVE
Protein, UA: NEGATIVE
RBC, UA: NEGATIVE
Specific Gravity, UA: 1.015 (ref 1.005–1.030)
Urobilinogen, Ur: 1 mg/dL (ref 0.2–1.0)
pH, UA: 5.5 (ref 5.0–7.5)

## 2017-03-03 LAB — GLUCOSE HEMOCUE WAIVED: Glu Hemocue Waived: 235 mg/dL — ABNORMAL HIGH (ref 65–99)

## 2017-03-03 MED ORDER — INSULIN PEN NEEDLE 32G X 4 MM MISC: 1.0000 [IU] | Freq: Every morning | 3 refills | Status: DC

## 2017-03-03 NOTE — Assessment & Plan Note (Signed)
Stable, continue present medications.   

## 2017-03-03 NOTE — Assessment & Plan Note (Addendum)
Written instructions given.  Start Basaglar 20 units QHS.  Titrate 2 units if fasting blood sugar is greater than 120.  Decrease by 2 units if fasting blood sugar is less than 95.  Continue Metformin and Invokana

## 2017-03-03 NOTE — Assessment & Plan Note (Signed)
Continue Atorvastatin 20 mg

## 2017-03-03 NOTE — Patient Instructions (Signed)
Base your long acting insulin on your fasting (usually in the morning) blood sugar.  Increase long acting (daily insulin) 2 units if fasting blood sugar is greater than 120.  Decrease by 2 units if fasting blood sugar is less than 95.    

## 2017-03-03 NOTE — Progress Notes (Signed)
BP 103/68   Pulse 97   Temp 97.7 F (36.5 C) (Oral)   Wt 176 lb 14.4 oz (80.2 kg)   SpO2 95%   BMI 30.18 kg/m    Subjective:    Patient ID: Carolyn Roy, female    DOB: 1965/04/11, 52 y.o.   MRN: [id number]  HPI: Carolyn Roy is a 52 y.o. female  Chief Complaint  Patient presents with  . Depression  . Diabetes   Depression Doing well on Effexor 75 mg daily.   Depression screen [facility] 2/9 03/03/2017 12/14/2016 11/15/2016 03/30/2015  Decreased Interest 1 1 2 3   Down, Depressed, Hopeless 1 1 3 3   PHQ - 2 Score 2 2 5 6   Altered sleeping 2 3 3 3   Tired, decreased energy 0 1 2 3   Change in appetite 2 1 3 3   Feeling bad or failure about yourself  0 1 2 3   Trouble concentrating 0 1 0 1  Moving slowly or fidgety/restless 1 0 0 2  Suicidal thoughts 0 0 1 2  PHQ-9 Score 7 9 16 23    Diabetes Pt states she had a health screening at work and Hgb A1C was 10.1.  States she doesn't feel bad and is confused.  No frequent urination.  Does stay thirsty.  She is taking 1,000 mg BID of Metformin and Invokanna of 100 mg.  Does drink sweet tea daily.  Pepsi on occasion.    Hypertension Using medications without difficulty Average home BPs   No problems or lightheadedness No chest pain with exertion or shortness of breath No Edema  Hyperlipidemia Using medications without problems No Muscle aches  Diet compliance:Exercise:  Diet is good but low in vegetables  Relevant past medical, surgical, family and social history reviewed and updated as indicated. Interim medical history since our last visit reviewed. Allergies and medications reviewed and updated.  Review of Systems  Constitutional: Negative.   Gastrointestinal:       Epigastric pain with eating.      Per HPI unless specifically indicated above     Objective:    BP 103/68   Pulse 97   Temp 97.7 F (36.5 C) (Oral)   Wt 176 lb 14.4 oz (80.2 kg)   SpO2 95%   BMI 30.18 kg/m   Wt Readings from Last 3 Encounters:    03/03/17 176 lb 14.4 oz (80.2 kg)  12/14/16 197 lb 6.4 oz (89.5 kg)  11/15/16 183 lb 8 oz (83.2 kg)    Physical Exam  Constitutional: She is oriented to person, place, and time. She appears well-developed and well-nourished. No distress.  HENT:  Head: Normocephalic and atraumatic.  Eyes: Conjunctivae and lids are normal. Right eye exhibits no discharge. Left eye exhibits no discharge. No scleral icterus.  Neck: Normal range of motion. Neck supple. No JVD present. Carotid bruit is not present.  Cardiovascular: Normal rate, regular rhythm and normal heart sounds.  Pulmonary/Chest: Effort normal and breath sounds normal.  Abdominal: Normal appearance. There is no splenomegaly or hepatomegaly.  Musculoskeletal: Normal range of motion.  Neurological: She is alert and oriented to person, place, and time.  Skin: Skin is warm, dry and intact. No rash noted. No pallor.  Psychiatric: She has a normal mood and affect. Her behavior is normal. Judgment and thought content normal.    Results for orders placed or performed in visit on 11/15/16  Lipid Panel w/o Chol/HDL Ratio  Result Value Ref Range   Cholesterol, Total CANCELED mg/dL  Triglycerides CANCELED    HDL CANCELED       Assessment & Plan:   Problem List Items Addressed This Visit      Unprioritized   Depression, major, single episode, severe ([place])    Stable, continue present medications.        Hypercholesteremia    Continue Atorvastatin 20 mg      Poorly controlled type 2 diabetes mellitus ([place]) - Primary    Written instructions given.  Start Basaglar 20 units QHS.  Titrate 2 units if fasting blood sugar is greater than 120.  Decrease by 2 units if fasting blood sugar is less than 95.  Continue Metformin and Invokana       Relevant Orders   Glucose Hemocue Waived   Ambulatory referral to diabetic education   UA/M w/rflx Culture, Routine   Comprehensive metabolic panel       Follow up plan: Return in about 4 weeks  (around 03/31/2017) for Will need Microalbumin.

## 2017-03-04 LAB — COMPREHENSIVE METABOLIC PANEL
ALT: 15 IU/L (ref 0–32)
AST: 11 IU/L (ref 0–40)
Albumin/Globulin Ratio: 1.7 (ref 1.2–2.2)
Albumin: 4.3 g/dL (ref 3.5–5.5)
Alkaline Phosphatase: 132 IU/L — ABNORMAL HIGH (ref 39–117)
BUN/Creatinine Ratio: 14 (ref 9–23)
BUN: 9 mg/dL (ref 6–24)
Bilirubin Total: 0.3 mg/dL (ref 0.0–1.2)
CO2: 25 mmol/L (ref 20–29)
Calcium: 9.5 mg/dL (ref 8.7–10.2)
Chloride: 101 mmol/L (ref 96–106)
Creatinine, Ser: 0.64 mg/dL (ref 0.57–1.00)
GFR calc Af Amer: 121 mL/min/{1.73_m2} (ref >59)
GFR calc non Af Amer: 105 mL/min/{1.73_m2} (ref >59)
Globulin, Total: 2.5 g/dL (ref 1.5–4.5)
Glucose: 224 mg/dL — ABNORMAL HIGH (ref 65–99)
Potassium: 4.1 mmol/L (ref 3.5–5.2)
Sodium: 141 mmol/L (ref 134–144)
Total Protein: 6.8 g/dL (ref 6.0–8.5)

## 2017-03-28 ENCOUNTER — Other Ambulatory Visit: Payer: Self-pay | Admitting: Unknown Physician Specialty

## 2017-03-29 ENCOUNTER — Other Ambulatory Visit: Payer: Self-pay | Admitting: Unknown Physician Specialty

## 2017-03-31 ENCOUNTER — Ambulatory Visit: Payer: Managed Care, Other (non HMO) | Admitting: Unknown Physician Specialty

## 2017-03-31 ENCOUNTER — Encounter: Payer: Self-pay | Admitting: Unknown Physician Specialty

## 2017-03-31 VITALS — BP 113/75 | HR 91 | Temp 97.9°F | Ht 64.2 in | Wt 183.6 lb

## 2017-03-31 DIAGNOSIS — Z720 Tobacco use: Secondary | ICD-10-CM | POA: Diagnosis not present

## 2017-03-31 DIAGNOSIS — J449 Chronic obstructive pulmonary disease, unspecified: Secondary | ICD-10-CM | POA: Diagnosis not present

## 2017-03-31 DIAGNOSIS — E1165 Type 2 diabetes mellitus with hyperglycemia: Secondary | ICD-10-CM

## 2017-03-31 DIAGNOSIS — F17200 Nicotine dependence, unspecified, uncomplicated: Secondary | ICD-10-CM

## 2017-03-31 LAB — MICROALBUMIN, URINE WAIVED
Creatinine, Urine Waived: 50 mg/dL (ref 10–300)
Microalb, Ur Waived: 10 mg/L (ref 0–19)
Microalb/Creat Ratio: <30 mg/g (ref <30)

## 2017-03-31 MED ORDER — ALBUTEROL SULFATE HFA 108 (90 BASE) MCG/ACT IN AERS: 2.0000 | INHALATION_SPRAY | Freq: Four times a day (QID) | RESPIRATORY_TRACT | 0 refills | Status: DC | PRN

## 2017-03-31 MED ORDER — BASAGLAR KWIKPEN 100 UNIT/ML ~~LOC~~ SOPN: 24.0000 [IU] | PEN_INJECTOR | Freq: Every day | SUBCUTANEOUS | 2 refills | Status: DC

## 2017-03-31 NOTE — Progress Notes (Signed)
BP 113/75   Pulse 91   Temp 97.9 F (36.6 C) (Oral)   Ht 5' 4.2" (1.631 m)   Wt 183 lb 9.6 oz (83.3 kg)   SpO2 94%   BMI 31.32 kg/m    Subjective:    Patient ID: Carolyn Roy, female    DOB: 08-27-1965, 52 y.o.   MRN: [id number]  HPI: Carolyn Roy is a 52 y.o. female  Chief Complaint  Patient presents with  . Diabetes    4 week f/up- pt states she was up to 24 units of Basaglar before she ran out   Diabetes: Pt is here to f/u of her diabetes.  States she is taking the Invokana and Metformin.  However ran out of [employer].  Was up to 24 units before running out.  AM blood sugars were  No hypoglycemic episodes No hyperglycemic episodes Last Hgb A1C: 10.9  COPD States she smokes about 1/2 ppd.  States she does not get SOB.  States she does not cough much.    Relevant past medical, surgical, family and social history reviewed and updated as indicated. Interim medical history since our last visit reviewed. Allergies and medications reviewed and updated.  Review of Systems  Per HPI unless specifically indicated above     Objective:    BP 113/75   Pulse 91   Temp 97.9 F (36.6 C) (Oral)   Ht 5' 4.2" (1.631 m)   Wt 183 lb 9.6 oz (83.3 kg)   SpO2 94%   BMI 31.32 kg/m   Wt Readings from Last 3 Encounters:  03/31/17 183 lb 9.6 oz (83.3 kg)  03/03/17 176 lb 14.4 oz (80.2 kg)  12/14/16 197 lb 6.4 oz (89.5 kg)    Physical Exam  Constitutional: She is oriented to person, place, and time. She appears well-developed and well-nourished. No distress.  HENT:  Head: Normocephalic and atraumatic.  Eyes: Conjunctivae and lids are normal. Right eye exhibits no discharge. Left eye exhibits no discharge. No scleral icterus.  Neck: Normal range of motion. Neck supple. No JVD present. Carotid bruit is not present.  Cardiovascular: Normal rate, regular rhythm and normal heart sounds.  Pulmonary/Chest: Effort normal. She has wheezes.  Abdominal: Normal appearance. There is  no splenomegaly or hepatomegaly.  Musculoskeletal: Normal range of motion.  Neurological: She is alert and oriented to person, place, and time.  Skin: Skin is warm, dry and intact. No rash noted. No pallor.  Psychiatric: She has a normal mood and affect. Her behavior is normal. Judgment and thought content normal.    Results for orders placed or performed in visit on 03/03/17  Glucose Hemocue Waived  Result Value Ref Range   Glu Hemocue Waived 235 (H) 65 - 99 mg/dL  UA/M w/rflx Culture, Routine  Result Value Ref Range   Specific Gravity, UA 1.015 1.005 - 1.030   pH, UA 5.5 5.0 - 7.5   Color, UA Yellow Yellow   Appearance Ur Clear Clear   Leukocytes, UA Negative Negative   Protein, UA Negative Negative/Trace   Glucose, UA 3+ (A) Negative   Ketones, UA Negative Negative   RBC, UA Negative Negative   Bilirubin, UA Negative Negative   Urobilinogen, Ur 1.0 0.2 - 1.0 mg/dL   Nitrite, UA Negative Negative  Comprehensive metabolic panel  Result Value Ref Range   Glucose 224 (H) 65 - 99 mg/dL   BUN 9 6 - 24 mg/dL   Creatinine, Ser 0.64 0.57 - 1.00 mg/dL  GFR calc non Af Amer 105 >59 mL/min/1.73   GFR calc Af Amer 121 >59 mL/min/1.73   BUN/Creatinine Ratio 14 9 - 23   Sodium 141 134 - 144 mmol/L   Potassium 4.1 3.5 - 5.2 mmol/L   Chloride 101 96 - 106 mmol/L   CO2 25 20 - 29 mmol/L   Calcium 9.5 8.7 - 10.2 mg/dL   Total Protein 6.8 6.0 - 8.5 g/dL   Albumin 4.3 3.5 - 5.5 g/dL   Globulin, Total 2.5 1.5 - 4.5 g/dL   Albumin/Globulin Ratio 1.7 1.2 - 2.2   Bilirubin Total 0.3 0.0 - 1.2 mg/dL   Alkaline Phosphatase 132 (H) 39 - 117 IU/L   AST 11 0 - 40 IU/L   ALT 15 0 - 32 IU/L      Assessment & Plan:   Problem List Items Addressed This Visit      Unprioritized   Poorly controlled type 2 diabetes mellitus ([place]) - Primary    Fasting blood sugars are to goal with 24 units  nightly.  Continue this dose. Recheck in 2 months      Relevant Medications   Insulin Glargine (BASAGLAR  KWIKPEN) 100 UNIT/ML SOPN   Other Relevant Orders   Microalbumin, Urine Waived   Stage 1 mild COPD by GOLD classification (HCC)    Inhaler to use prn. Rx for inhaler to use prn      Relevant Medications   albuterol (PROVENTIL HFA;VENTOLIN HFA) 108 (90 Base) MCG/ACT inhaler   Tobacco abuse     I have recommended absolute tobacco cessation. I have discussed various options available for assistance with tobacco cessation including over the counter methods (Nicotine gum, patch and lozenges). We also discussed prescription options (Chantix, Nicotine Inhaler / Nasal Spray). The patient is not interested in pursuing any prescription tobacco cessation options at this time.        Other Visit Diagnoses    Smoker       Relevant Orders   Spirometry with Graph (Completed)       Follow up plan: Return in about 2 months (around 05/29/2017).

## 2017-03-31 NOTE — Assessment & Plan Note (Addendum)
Inhaler to use prn. Rx for inhaler to use prn

## 2017-03-31 NOTE — Assessment & Plan Note (Addendum)
Fasting blood sugars are to goal with 24 units  nightly.  Continue this dose. Recheck in 2 months

## 2017-03-31 NOTE — Assessment & Plan Note (Signed)
I have recommended absolute tobacco cessation. I have discussed various options available for assistance with tobacco cessation including over the counter methods (Nicotine gum, patch and lozenges). We also discussed prescription options (Chantix, Nicotine Inhaler / Nasal Spray). The patient is not interested in pursuing any prescription tobacco cessation options at this time.  

## 2017-05-18 ENCOUNTER — Ambulatory Visit: Payer: Self-pay | Admitting: *Deleted

## 2017-05-18 ENCOUNTER — Telehealth: Payer: Self-pay | Admitting: Unknown Physician Specialty

## 2017-05-18 MED ORDER — CANAGLIFLOZIN 100 MG PO TABS
ORAL_TABLET | ORAL | 0 refills | Status: DC
Start: 2017-05-18 — End: 2017-07-04

## 2017-05-18 MED ORDER — CANAGLIFLOZIN 100 MG PO TABS: ORAL_TABLET | ORAL | 0 refills | Status: DC

## 2017-05-18 NOTE — Telephone Encounter (Signed)
Pt having complaints of a numbing and tingling sensation in right arm and leg for approximately 2 weeks.  Pt states she also experiences pain mostly in her arm and is currently rating it 6 on a scale of 1-10. Pt denies any chest pain, shortness of breath, blurry vision or any other symptoms. Pt states that she does heavy lifting at work but states that she did not experience this sensation until she started taking Basaglar insulin. Pt started Basaglar in February 2019. Spoke with Santiago Glad, flow coordinator to discuss disposition of pt. Appt scheduled with Perrin Maltese on tomorrow due to pt PCP not being in the office. Pt advised if symptoms worsen or if new symptoms develop to seek treatment in the ED.   Reason for Disposition . [1] Numbness (i.e., loss of sensation) of the face, arm / hand, or leg / foot on one side of the body AND [2] gradual onset (e.g., days to weeks) AND [3] present now  Answer Assessment - Initial Assessment Questions 1. SYMPTOM: "What is the main symptom you are concerned about?" (e.g., weakness, numbness)     Numbness and tingling in right arm and leg 2. ONSET: "When did this start?" (minutes, hours, days; while sleeping)     2 weeks ago 3. LAST NORMAL: "When was the last time you were normal (no symptoms)?"     Approximately 2 weeks ago 4. PATTERN "Does this come and go, or has it been constant since it started?"  "Is it present now?"     It has been constant and symptoms are present now 5. CARDIAC SYMPTOMS: "Have you had any of the following symptoms: chest pain, difficulty breathing, palpitations?"     No 6. NEUROLOGIC SYMPTOMS: "Have you had any of the following symptoms: headache, dizziness, vision loss, double vision, changes in speech, unsteady on your feet?"     No 7. OTHER SYMPTOMS: "Do you have any other symptoms?"     No 8. PREGNANCY: "Is there any chance you are pregnant?" "When was your last menstrual period?"     n/a  Protocols used: NEUROLOGIC  DEFICIT-A-AH

## 2017-05-18 NOTE — Telephone Encounter (Signed)
Copied from [place] [id number]. Topic: Quick Communication - Rx Refill/Question >> May 18, 2017  3:53 PM Ether Griffins B wrote: Medication: INVOKANA 100 MG TABS tablet   Has the patient contacted their pharmacy? Yes.   (Agent: If no, request that the patient contact the pharmacy for the refill.)  Preferred Pharmacy (with phone number or street name): [id number] - [place], [place] [address]  Agent: Please be advised that RX refills may take up to 3 business days. We ask that you follow-up with your pharmacy.

## 2017-05-19 ENCOUNTER — Encounter: Payer: Self-pay | Admitting: Family Medicine

## 2017-05-19 ENCOUNTER — Ambulatory Visit: Payer: Managed Care, Other (non HMO) | Admitting: Family Medicine

## 2017-05-19 VITALS — BP 105/69 | HR 90 | Temp 97.6°F | Wt 192.6 lb

## 2017-05-19 DIAGNOSIS — M5410 Radiculopathy, site unspecified: Secondary | ICD-10-CM | POA: Diagnosis not present

## 2017-05-19 MED ORDER — PREDNISONE 10 MG PO TABS: ORAL_TABLET | ORAL | 0 refills | Status: DC

## 2017-05-19 MED ORDER — CYCLOBENZAPRINE HCL 10 MG PO TABS: 10.0000 mg | ORAL_TABLET | Freq: Three times a day (TID) | ORAL | 0 refills | Status: DC | PRN

## 2017-05-19 NOTE — Progress Notes (Signed)
BP 105/69 (BP Location: Right Arm, Patient Position: Sitting, Cuff Size: Normal)   Pulse 90   Temp 97.6 F (36.4 C) (Oral)   Wt 192 lb 9.6 oz (87.4 kg)   SpO2 90%   BMI 32.85 kg/m    Subjective:    Patient ID: Carolyn Roy, female    DOB: 26-Aug-1965, 52 y.o.   MRN: [id number]  HPI: Carolyn Roy is a 52 y.o. female  Chief Complaint  Patient presents with  . Arm Numbness    And tingling. x's 2 weeks. Right arm. Denies SOB, Chest Pain and Dizziness. Patient states she does a lot of heavy lifting at work.   Pt here with right arm and leg tingling/numbness, and dull achy pain x 2 weeks. Sleeps on right side, tried flipping sides but this didn't seem to help. Notices mildly decreased strength on that side of her body lately but overall good ROM and function intact. Taking ibuprofen and tylenol which eases it temporarily.  Right leg, down front from knee down. Stands on concrete all the time. Denies any speech, cognitive deficits.   Relevant past medical, surgical, family and social history reviewed and updated as indicated. Interim medical history since our last visit reviewed. Allergies and medications reviewed and updated.  Review of Systems  Per HPI unless specifically indicated above     Objective:    BP 105/69 (BP Location: Right Arm, Patient Position: Sitting, Cuff Size: Normal)   Pulse 90   Temp 97.6 F (36.4 C) (Oral)   Wt 192 lb 9.6 oz (87.4 kg)   SpO2 90%   BMI 32.85 kg/m   Wt Readings from Last 3 Encounters:  05/19/17 192 lb 9.6 oz (87.4 kg)  03/31/17 183 lb 9.6 oz (83.3 kg)  03/03/17 176 lb 14.4 oz (80.2 kg)    Physical Exam  Constitutional: She is oriented to person, place, and time. She appears well-developed and well-nourished. No distress.  HENT:  Head: Normocephalic and atraumatic.  Eyes: Pupils are equal, round, and reactive to light. Conjunctivae and EOM are normal.  Neck: Normal range of motion. Neck supple.  Cardiovascular: Normal rate  and regular rhythm.  Pulmonary/Chest: Effort normal and breath sounds normal. No respiratory distress.  Musculoskeletal: Normal range of motion. She exhibits no tenderness or deformity.  Minimally decreased grip strength right hand  Neurological: She is alert and oriented to person, place, and time. No cranial nerve deficit. She exhibits normal muscle tone. Coordination normal.  All 4 extremities neurovascularly intact on exam  Skin: Skin is warm and dry.  Psychiatric: She has a normal mood and affect. Her behavior is normal.  Nursing note and vitals reviewed.   Results for orders placed or performed in visit on 03/31/17  Microalbumin, Urine Waived  Result Value Ref Range   Microalb, Ur Waived 10 0 - 19 mg/L   Creatinine, Urine Waived 50 10 - 300 mg/dL   Microalb/Creat Ratio <30 <30 mg/g      Assessment & Plan:   Problem List Items Addressed This Visit    None    Visit Diagnoses    Radiculopathy, unspecified spinal region    -  Primary   Options reviewed, pt opting to start with conservative mgmt with prednisone and flexeril, epsom salt soaks, stretches. Will f/u if no improvement   Relevant Medications   cyclobenzaprine (FLEXERIL) 10 MG tablet       Follow up plan: Return if symptoms worsen or fail to improve.

## 2017-05-22 ENCOUNTER — Encounter: Payer: Self-pay | Admitting: Family Medicine

## 2017-05-22 NOTE — Patient Instructions (Signed)
Follow up as needed

## 2017-06-02 ENCOUNTER — Encounter: Payer: Self-pay | Admitting: Unknown Physician Specialty

## 2017-06-02 ENCOUNTER — Ambulatory Visit: Payer: Managed Care, Other (non HMO) | Admitting: Unknown Physician Specialty

## 2017-06-02 VITALS — BP 109/72 | HR 83 | Temp 98.6°F | Ht 64.2 in | Wt 196.2 lb

## 2017-06-02 DIAGNOSIS — N6320 Unspecified lump in the left breast, unspecified quadrant: Secondary | ICD-10-CM | POA: Diagnosis not present

## 2017-06-02 DIAGNOSIS — F322 Major depressive disorder, single episode, severe without psychotic features: Secondary | ICD-10-CM | POA: Diagnosis not present

## 2017-06-02 DIAGNOSIS — E1165 Type 2 diabetes mellitus with hyperglycemia: Secondary | ICD-10-CM

## 2017-06-02 LAB — BAYER DCA HB A1C WAIVED: HB A1C (BAYER DCA - WAIVED): 8.8 % — ABNORMAL HIGH (ref <7.0)

## 2017-06-02 MED ORDER — SEMAGLUTIDE(0.25 OR 0.5MG/DOS) 2 MG/1.5ML ~~LOC~~ SOPN: 0.2500 mg | PEN_INJECTOR | SUBCUTANEOUS | 0 refills | Status: DC

## 2017-06-02 MED ORDER — VENLAFAXINE HCL ER 150 MG PO CP24: 150.0000 mg | ORAL_CAPSULE | Freq: Every day | ORAL | 1 refills | Status: DC

## 2017-06-02 NOTE — Progress Notes (Signed)
BP 109/72   Pulse 83   Temp 98.6 F (37 C) (Oral)   Ht 5' 4.2" (1.631 m)   Wt 196 lb 3.2 oz (89 kg)   SpO2 96%   BMI 33.47 kg/m    Subjective:    Patient ID: Carolyn Roy, female    DOB: 08-Oct-1965, 52 y.o.   MRN: [id number]  HPI: Carolyn Roy is a 52 y.o. female  Chief Complaint  Patient presents with  . Depression  . Diabetes  . Hyperlipidemia  . Hypertension   Diabetes: Using medications without difficulties.  Was up to 93 but now trying to back down to 24   No hypoglycemic episodes No hyperglycemic episodes Feet problems: none Blood Sugars averaging: States it was high for a period of time  Hypertension  Using medications without difficulty Average home BPs No   Using medication without problems or lightheadedness No chest pain with exertion or shortness of breath No Edema  Elevated Cholesterol Using medications without problems No Muscle aches  Diet: Exercise: Diet fell off with Easter.    Depression Lately exacerbated.  Primary problem is that she can't sleep.  Wakes up anxious Depression screen [facility] 2/9 06/02/2017 03/03/2017 12/14/2016 11/15/2016 03/30/2015  Decreased Interest 3 1 1 2 3   Down, Depressed, Hopeless - 1 1 3 3   PHQ - 2 Score 3 2 2 5 6   Altered sleeping 2 2 3 3 3   Tired, decreased energy 1 0 1 2 3   Change in appetite 2 2 1 3 3   Feeling bad or failure about yourself  0 0 1 2 3   Trouble concentrating 0 0 1 0 1  Moving slowly or fidgety/restless 1 1 0 0 2  Suicidal thoughts 1 0 0 1 2  PHQ-9 Score 10 7 9 16 23      Relevant past medical, surgical, family and social history reviewed and updated as indicated. Interim medical history since our last visit reviewed. Allergies and medications reviewed and updated.  Review of Systems  Genitourinary:       Left breast lump for 3 months    Per HPI unless specifically indicated above     Objective:    BP 109/72   Pulse 83   Temp 98.6 F (37 C) (Oral)   Ht 5' 4.2" (1.631 m)   Wt  196 lb 3.2 oz (89 kg)   SpO2 96%   BMI 33.47 kg/m   Wt Readings from Last 3 Encounters:  06/02/17 196 lb 3.2 oz (89 kg)  05/19/17 192 lb 9.6 oz (87.4 kg)  03/31/17 183 lb 9.6 oz (83.3 kg)    Physical Exam  Constitutional: She is oriented to person, place, and time. She appears well-developed and well-nourished. No distress.  HENT:  Head: Normocephalic and atraumatic.  Eyes: Conjunctivae and lids are normal. Right eye exhibits no discharge. Left eye exhibits no discharge. No scleral icterus.  Neck: Normal range of motion. Neck supple. No JVD present. Carotid bruit is not present.  Cardiovascular: Normal rate, regular rhythm and normal heart sounds.  Pulmonary/Chest: Effort normal and breath sounds normal. Right breast exhibits mass.  Mass left breast 3 oclock  Abdominal: Normal appearance. There is no splenomegaly or hepatomegaly.  Musculoskeletal: Normal range of motion.  Neurological: She is alert and oriented to person, place, and time.  Skin: Skin is warm, dry and intact. No rash noted. No pallor.  Psychiatric: She has a normal mood and affect. Her behavior is normal. Judgment and thought content  normal.    Results for orders placed or performed in visit on 03/31/17  Microalbumin, Urine Waived  Result Value Ref Range   Microalb, Ur Waived 10 0 - 19 mg/L   Creatinine, Urine Waived 50 10 - 300 mg/dL   Microalb/Creat Ratio <30 <30 mg/g      Assessment & Plan:   Problem List Items Addressed This Visit      Unprioritized   Poorly controlled type 2 diabetes mellitus ([place]) - Primary    Hgb A1C is 8.8.  Stay at 20 units.  Start Ozempic weekly at .25 mg.  Sample given.  First shot given in office      Relevant Medications   Semaglutide (OZEMPIC) 0.25 or 0.5 MG/DOSE SOPN   Other Relevant Orders   Comprehensive metabolic panel   Lipid Panel w/o Chol/HDL Ratio   Bayer DCA Hb A1c Waived   Severe depression ([place])    Getting worse.  Increase Effexor ton 150 mg      Relevant  Medications   venlafaxine XR (EFFEXOR-XR) 150 MG 24 hr capsule    Other Visit Diagnoses    Breast mass, left       left breast mass.  Order mammogram and refer to surgery for further evaluation   Relevant Orders   Ambulatory referral to General Surgery   MM Digital Diagnostic Unilat L   MM Digital Screening Unilat R      Greater than 50% of this  40 minute visit was spent in counseling/coordination of care regarding diabetes, breast mass, and depression management   Follow up plan: Return in about 1 month (around 06/30/2017).

## 2017-06-02 NOTE — Assessment & Plan Note (Signed)
Getting worse.  Increase Effexor ton 150 mg

## 2017-06-02 NOTE — Assessment & Plan Note (Addendum)
Hgb A1C is 8.8.  Stay at 20 units.  Start Ozempic weekly at .25 mg.  Sample given.  First shot given in office

## 2017-06-02 NOTE — Patient Instructions (Signed)
Please do call to schedule your mammogram; the number to schedule one at either [facility] or [facility] is [phone]   

## 2017-06-03 LAB — COMPREHENSIVE METABOLIC PANEL
ALT: 10 IU/L (ref 0–32)
AST: 8 IU/L (ref 0–40)
Albumin/Globulin Ratio: 1.5 (ref 1.2–2.2)
Albumin: 3.8 g/dL (ref 3.5–5.5)
Alkaline Phosphatase: 110 IU/L (ref 39–117)
BUN/Creatinine Ratio: 18 (ref 9–23)
BUN: 11 mg/dL (ref 6–24)
Bilirubin Total: 0.2 mg/dL (ref 0.0–1.2)
CO2: 23 mmol/L (ref 20–29)
Calcium: 9.5 mg/dL (ref 8.7–10.2)
Chloride: 102 mmol/L (ref 96–106)
Creatinine, Ser: 0.61 mg/dL (ref 0.57–1.00)
GFR calc Af Amer: 123 mL/min/{1.73_m2} (ref >59)
GFR calc non Af Amer: 107 mL/min/{1.73_m2} (ref >59)
Globulin, Total: 2.5 g/dL (ref 1.5–4.5)
Glucose: 226 mg/dL — ABNORMAL HIGH (ref 65–99)
Potassium: 4.1 mmol/L (ref 3.5–5.2)
Sodium: 139 mmol/L (ref 134–144)
Total Protein: 6.3 g/dL (ref 6.0–8.5)

## 2017-06-03 LAB — LIPID PANEL W/O CHOL/HDL RATIO
Cholesterol, Total: 217 mg/dL — ABNORMAL HIGH (ref 100–199)
HDL: 45 mg/dL (ref >39)
LDL Calculated: 132 mg/dL — ABNORMAL HIGH (ref 0–99)
Triglycerides: 198 mg/dL — ABNORMAL HIGH (ref 0–149)
VLDL Cholesterol Cal: 40 mg/dL (ref 5–40)

## 2017-06-05 ENCOUNTER — Other Ambulatory Visit: Payer: Self-pay | Admitting: Unknown Physician Specialty

## 2017-06-05 DIAGNOSIS — N63 Unspecified lump in unspecified breast: Secondary | ICD-10-CM

## 2017-06-05 DIAGNOSIS — N6320 Unspecified lump in the left breast, unspecified quadrant: Secondary | ICD-10-CM

## 2017-06-05 MED ORDER — ATORVASTATIN CALCIUM 40 MG PO TABS: 40.0000 mg | ORAL_TABLET | Freq: Every day | ORAL | 3 refills | Status: DC

## 2017-06-09 ENCOUNTER — Other Ambulatory Visit: Payer: Self-pay | Admitting: Unknown Physician Specialty

## 2017-06-12 ENCOUNTER — Ambulatory Visit
Admission: RE | Admit: 2017-06-12 | Discharge: 2017-06-12 | Disposition: A | Discharge status: Home / Self Care | Payer: Managed Care, Other (non HMO) | Source: Ambulatory Visit | Attending: Unknown Physician Specialty | Admitting: Unknown Physician Specialty

## 2017-06-12 ENCOUNTER — Encounter (HOSPITAL_COMMUNITY): Payer: Self-pay

## 2017-06-12 ENCOUNTER — Other Ambulatory Visit: Payer: Self-pay | Admitting: Unknown Physician Specialty

## 2017-06-12 DIAGNOSIS — N63 Unspecified lump in unspecified breast: Secondary | ICD-10-CM

## 2017-06-12 DIAGNOSIS — R9389 Abnormal findings on diagnostic imaging of other specified body structures: Secondary | ICD-10-CM | POA: Diagnosis not present

## 2017-06-12 DIAGNOSIS — N6342 Unspecified lump in left breast, subareolar: Secondary | ICD-10-CM | POA: Diagnosis not present

## 2017-06-12 DIAGNOSIS — N6320 Unspecified lump in the left breast, unspecified quadrant: Secondary | ICD-10-CM | POA: Diagnosis present

## 2017-06-12 DIAGNOSIS — R928 Other abnormal and inconclusive findings on diagnostic imaging of breast: Secondary | ICD-10-CM

## 2017-06-12 IMAGING — MG DIGITAL DIAGNOSTIC BILATERAL MAMMOGRAM WITH TOMO AND CAD
8 of 12 series · 8 of 32 positions shown · non-contrast
Comparison: Previous exam(s).

CLINICAL DATA: Palpable lump within the LEFT breast.History of
probably benign RIGHT breast cyst.

EXAM:
DIGITAL DIAGNOSTIC BILATERAL MAMMOGRAM WITH CAD AND TOMO
ULTRASOUND BILATERAL BREAST

[L MLO synth-2D]
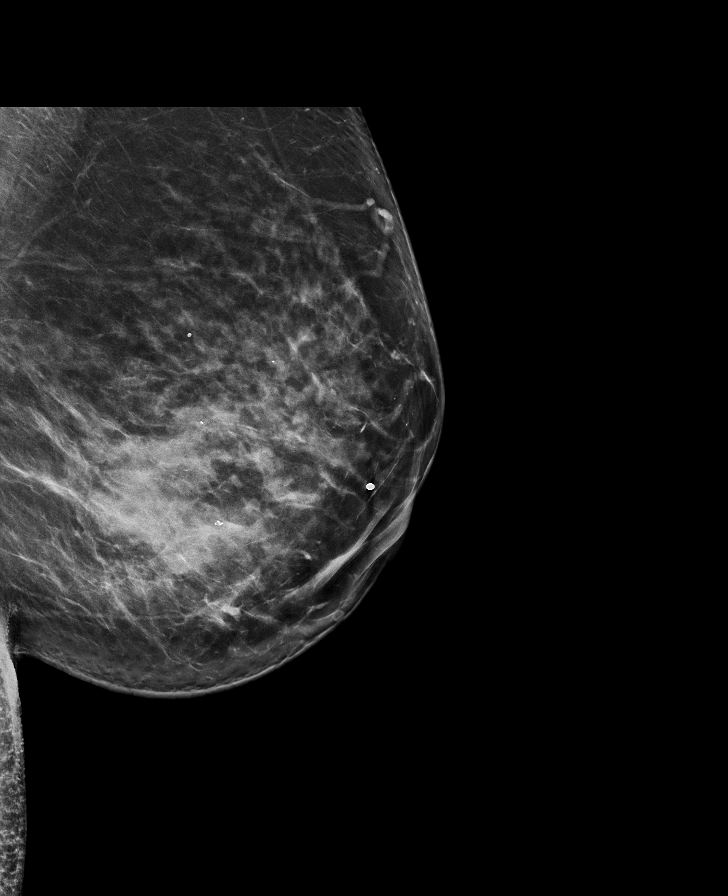

[L CC synth-2D]
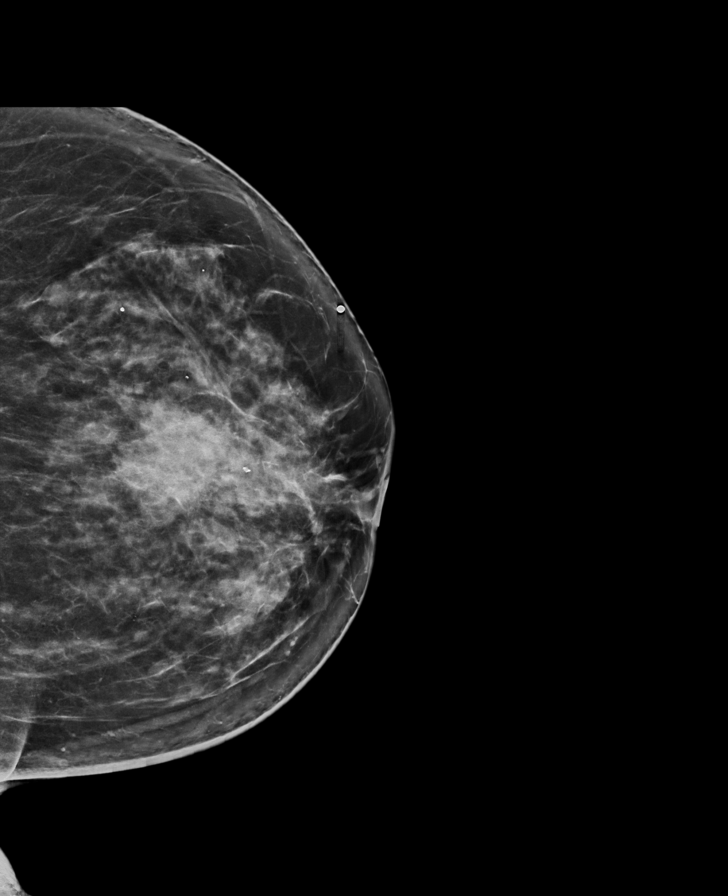

[L TAN synth-2D]
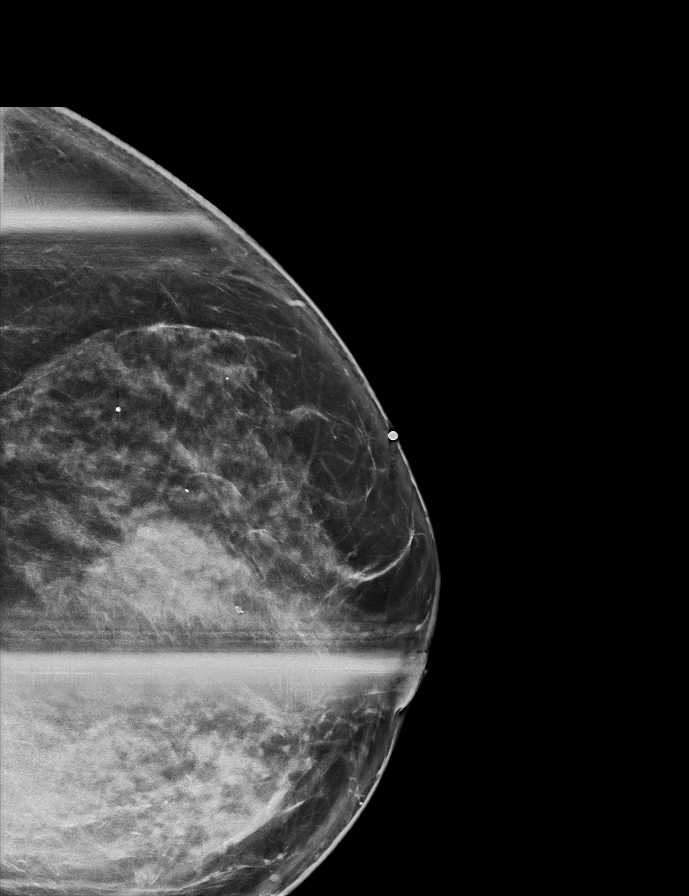

[R CC synth-2D]
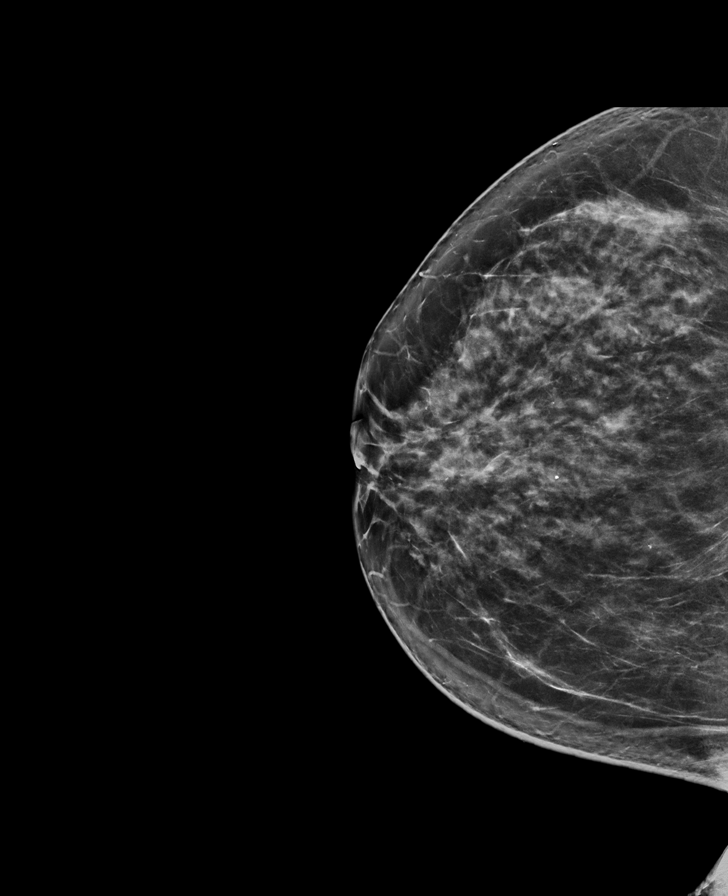

[R MLO synth-2D]
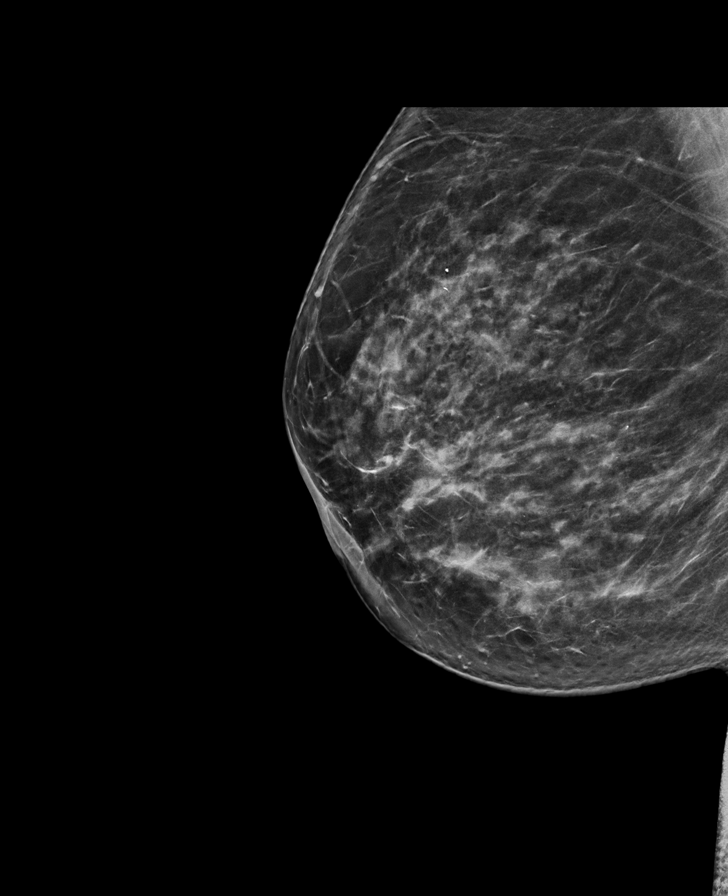

[L TAN tomo · tomo slice 41/82.0]
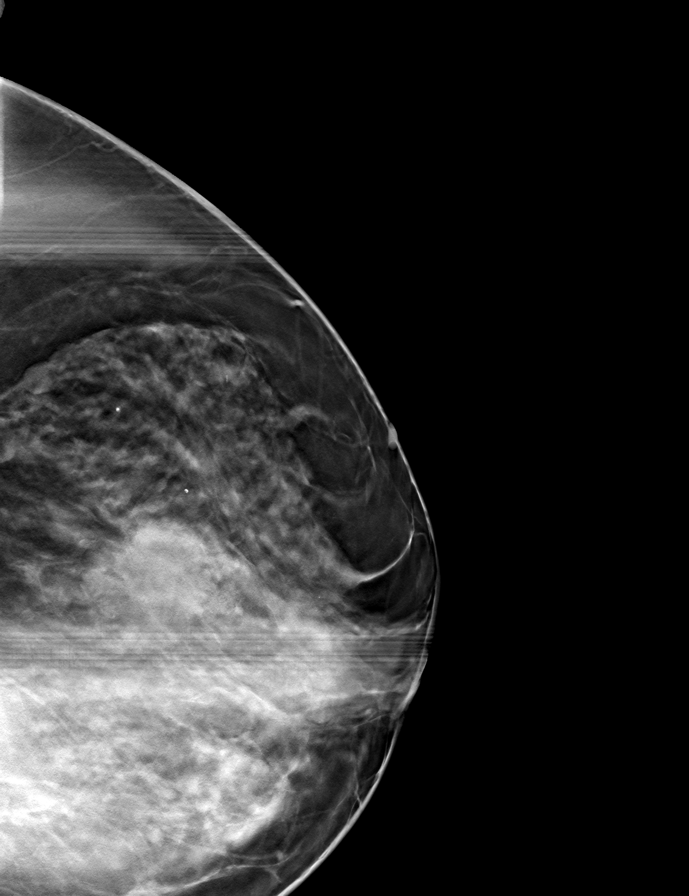

[L MLO]
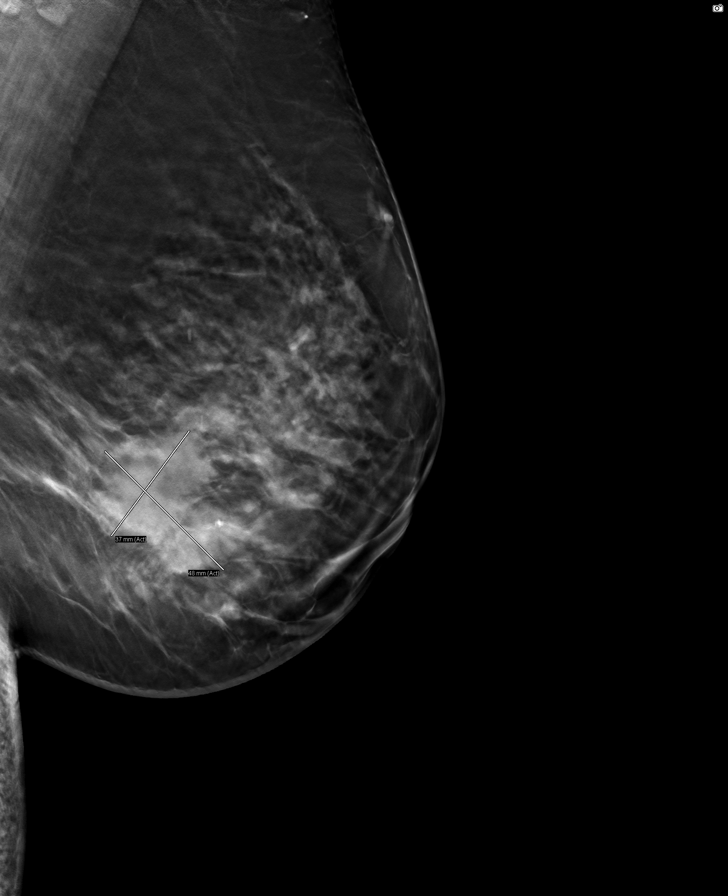

[L CC]
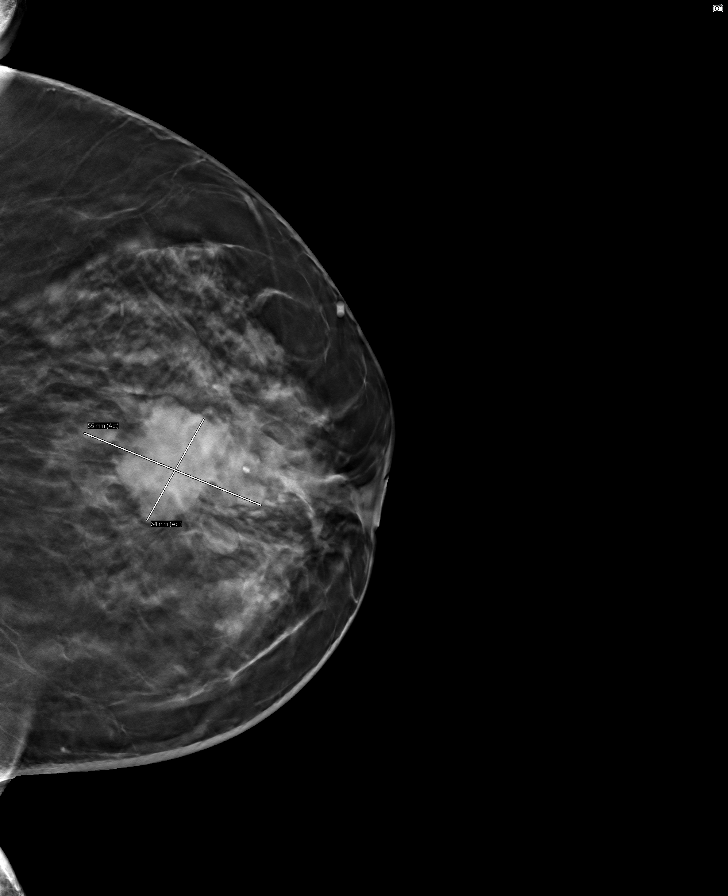

[8 of 32 positions shown; findings below may reference images not displayed]

ACR Breast Density Category c: The breast tissue is heterogeneously
dense, which may obscure small masses.
FINDINGS: LEFT breast: There is an irregular mass with associated
architectural distortion within the retroareolar LEFT breast, middle
depth, centered at the 3-4 o'clock axis, dominant component
measuring approximately 3.5 cm greatest dimension, probable
surrounding satellite masses extending the overall measurement to
approximately 5.5 cm greatest dimension.

RIGHT breast: There are no new dominant masses, suspicious
calcifications or secondary signs of malignancy identified within
the RIGHT breast.

Mammographic images were processed with CAD.

LEFT breast: Targeted ultrasound is performed, showing irregular
hypoechoic mass within the retroareolar LEFT breast, 3 o'clock axis,
dominant component measuring 3 cm, combination of mass and
surrounding smaller satellite masses measuring up to 5.5 cm greatest
dimension, corresponding to the mammographic finding and palpable
lump.

RIGHT breast: Targeted ultrasound is performed, evaluating the area
of previously demonstrated probably benign cyst in the upper-outer
quadrant of the RIGHT breast, showing scattered benign cysts in the
upper-outer quadrant of the RIGHT breast. No suspicious solid or
cystic mass is seen.

LEFT axilla was evaluated with ultrasound showing a single
morphologically abnormal lymph node with thickened cortex measuring
5 mm.
IMPRESSION: 1. Highly suspicious mass within the retroareolar LEFT breast,
dominant component measuring 3 cm, combination of dominant mass and
surrounding smaller satellite masses measuring 5.5 cm greatest
dimension, corresponding to the palpable lump. Ultrasound-guided
biopsy is recommended.
2. Single morphologically abnormal lymph node within the LEFT
axilla, with thickened cortex measuring 5 mm. Ultrasound-guided
biopsy is recommended.
3. No evidence of malignancy within the RIGHT breast.

RECOMMENDATION:
1. Ultrasound-guided biopsy of the retroareolar LEFT breast mass,
centered at the 3 o'clock axis.
2. Ultrasound-guided biopsy of the single morphologically abnormal
lymph node in the LEFT axilla.

Ordering physician will be contacted with today's results and
patient will then be scheduled for ultrasound-guided biopsies at her
earliest convenience.

I have discussed the findings and recommendations with the patient.
Results were also provided in writing at the conclusion of the
visit. If applicable, a reminder letter will be sent to the patient
regarding the next appointment.

BI-RADS CATEGORY  5: Highly suggestive of malignancy.

## 2017-06-13 ENCOUNTER — Telehealth: Payer: Self-pay | Admitting: Unknown Physician Specialty

## 2017-06-13 NOTE — Telephone Encounter (Signed)
Called No answer.

## 2017-06-13 NOTE — Telephone Encounter (Signed)
Left message to make sure Dr. Festus Aloe office contacted her about her mammogram

## 2017-06-14 ENCOUNTER — Encounter: Payer: Self-pay | Admitting: *Deleted

## 2017-06-14 ENCOUNTER — Encounter: Payer: Self-pay | Admitting: General Surgery

## 2017-06-14 ENCOUNTER — Ambulatory Visit: Payer: Managed Care, Other (non HMO) | Admitting: General Surgery

## 2017-06-14 ENCOUNTER — Ambulatory Visit: Payer: Self-pay

## 2017-06-14 VITALS — BP 104/72 | HR 93 | Resp 16 | Ht 65.0 in | Wt 190.0 lb

## 2017-06-14 DIAGNOSIS — N6323 Unspecified lump in the left breast, lower outer quadrant: Secondary | ICD-10-CM | POA: Diagnosis not present

## 2017-06-14 DIAGNOSIS — C50812 Malignant neoplasm of overlapping sites of left female breast: Secondary | ICD-10-CM

## 2017-06-14 NOTE — Telephone Encounter (Signed)
   Pt return call and she said no one from Dr Festus Aloe office has contacted her.

## 2017-06-14 NOTE — Progress Notes (Signed)
Patient ID: Carolyn Roy, female   DOB: Aug 09, 1965, 52 y.o.   MRN: [id number]  Chief Complaint  Patient presents with  . Breast Problem    HPI Carolyn Roy is a 52 y.o. female.  who presents for a breast evaluation. The most recent mammogram was done on 06-12-17. She states she noticed a lump in left breast about 3 months ago. She states she didn't think anything about it because in 2010 she had inflammation and had to take an antibiotic for it to go away. She states it has gotten larger. Patient does not perform regular self breast checks and does not get regular mammograms done.  Prior mammogram was 2010. 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  In spite of a strong family history of early breast cancer the patient's last mammogram was 9 years ago.  She is here with her fiance, Carolyn Roy.  HPI  Past Medical History:  Diagnosis Date  . Diabetes mellitus without complication ([place])   . Endometriosis   . Hyperlipidemia   . Pneumonia     Past Surgical History:  Procedure Laterality Date  . OOPHORECTOMY    . TUBAL LIGATION      Family History  Problem Relation Age of Onset  . Diabetes Brother   . Pancreatitis Brother   . Cancer Maternal Grandmother        breast and lung  . Breast cancer Maternal Grandmother 59  . Breast cancer Maternal Aunt 90  . Breast cancer Maternal Aunt 70       great aunt    Social History Social History   Tobacco Use  . Smoking status: Current Every Day Smoker    Packs/day: 0.50    Years: 11.00    Pack years: 5.50    Types: Cigarettes  . Smokeless tobacco: Former Systems developer    Types: Snuff  Substance Use Topics  . Alcohol use: No    Alcohol/week: 0.0 oz  . 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 Aspirin Nausea And Vomiting    Current Outpatient Medications  Medication Sig Dispense Refill  . albuterol (PROVENTIL HFA;VENTOLIN HFA) 108 (90 Base) MCG/ACT  inhaler Inhale 2 puffs into the lungs every 6 (six) hours as needed for wheezing or shortness of breath. 1 Inhaler 0  . atorvastatin (LIPITOR) 40 MG tablet Take 1 tablet (40 mg total) by mouth daily. 90 tablet 3  . blood glucose meter kit and supplies KIT Dispense based on patient and insurance preference. Use up to four times daily as directed. E11.9 1 each 0  . canagliflozin (INVOKANA) 100 MG TABS tablet TAKE 1 TABLET (100 MG TOTAL) BY MOUTH DAILY BEFORE BREAKFAST. 30 tablet 0  . cyclobenzaprine (FLEXERIL) 10 MG tablet Take 1 tablet (10 mg total) by mouth 3 (three) times daily as needed for muscle spasms. Do not drive on medication 60 tablet 0  . glucose blood (ONE TOUCH ULTRA TEST) test strip Use up to 4 times/day 100 each 12  . Insulin Glargine (BASAGLAR KWIKPEN) 100 UNIT/ML SOPN Inject 0.24 mLs (24 Units total) into the skin at bedtime. 4 pen 2  . Insulin Pen Needle 32G X 4 MM MISC 1 Units by Does not apply route every morning. Pen needles 90 each 3  . metFORMIN (GLUCOPHAGE) 500 MG tablet TAKE 2 TABLETS (1,000 MG TOTAL) BY MOUTH 2 (TWO) TIMES DAILY WITH A MEAL. [place]  tablet 1  . ONETOUCH DELICA LANCETS 52W MISC USE TO TEST BLOOD SUGAR LEVELS 4 TIMES DAILY 100 each 12  . Semaglutide (OZEMPIC) 0.25 or 0.5 MG/DOSE SOPN Inject 0.25 mg into the skin once a week. 1 pen 0  . venlafaxine XR (EFFEXOR-XR) 150 MG 24 hr capsule Take 1 capsule (150 mg total) by mouth daily with breakfast. 30 capsule 1   No current facility-administered medications for this visit.     Review of Systems Review of Systems  Constitutional: Negative.   Respiratory: Negative.   Cardiovascular: Negative.   Neurological: Positive for headaches.    Blood pressure 104/72, pulse 93, resp. rate 16, height '5\' 5"'  (1.651 m), weight 190 lb (86.2 kg), SpO2 97 %.  Physical Exam Physical Exam  Constitutional: She is oriented to person, place, and time. She appears well-developed and well-nourished.  HENT:  Mouth/Throat: Oropharynx  is clear and moist.  Eyes: Conjunctivae are normal. No scleral icterus.  Neck: Neck supple.  Cardiovascular: Normal rate, regular rhythm and normal heart sounds.  Pulmonary/Chest: Effort normal and breath sounds normal. Right breast exhibits no inverted nipple, no mass, no nipple discharge, no skin change and no tenderness. Left breast exhibits mass. Left breast exhibits no inverted nipple, no nipple discharge, no skin change and no tenderness.  5 cm mass at 4 oclock left breast centered at edge of areola    Lymphadenopathy:    She has no cervical adenopathy.    She has no axillary adenopathy.  Neurological: She is alert and oriented to person, place, and time.  Skin: Skin is warm and dry.  Psychiatric: Her behavior is normal.    Data Reviewed Mammogram and ultrasound of May 6. 2019 reviewed.  Ultrasound examination of the left breast showed a hypoechoic mass in the retroareolar area, measuring 2.4 x 4.0 x 5.1 cm.  Posterior acoustic shadowing was difficult to assess at it extended to the pectoralis fascia.  Examination of the axilla confirmed a prominent lymph node measuring 1.06 x 1.32 x 1.33 cm.  Cortical thickening up to 0.47 cm.  BI-RADS-5.  Patient was amenable to FNA of the lymph node and core biopsy of the breast mass.  1 cc of 1% plain Xylocaine was used and a 22-gauge needle used to obtain multiple passes through the left axillary node`.  Slides were prepared for cytology.  The procedure was well-tolerated.  Band-Aid applied.  Attention was turned to the breast.  10 cc of 0.5% Xylocaine with 0.25% Marcaine with 1 to 200,000 units of epinephrine was utilized and well-tolerated.  The area was draped and ChloraPrep was applied to the skin.  A 14-gauge Finesse biopsy device was used.  Pre-and post fire images confirmed transgression of the mass.  Approximately 8 core samples were obtained through multiple locations within the lesion.  Scant bleeding was noted.  A postbiopsy clip was  placed.  The procedure was well-tolerated.  Postbiopsy instructions provided.    Assessment    Clinical  T3, N1 carcinoma of the left breast.    Plan    The indication for medical oncology assessment for possible neoadjuvant chemotherapy was reviewed.  The patient will be contacted when biopsy results are available.  At this time, she would only be an option for mastectomy without significant volume reduction of the sizable tumor in the central portion of the breast.  The potential for PowerPort placement was reviewed.  The risks associated with central venous access including arterial, pulmonary and venous injury were reviewed. The  possible need for additional treatment if pulmonary injury occurs (chest tube placement) was discussed.       [name] Krystan Northrop 06/15/2017, 7:59 PM  The patient will have the following labs drawn at [facility] today: CBC, CA 27.29, and CEA.  Referral also placed to medical oncology at the [facility]. Their office will contact the patient directly regarding an appointment per Dianna.   Dominga Ferry, CMA

## 2017-06-14 NOTE — Patient Instructions (Addendum)
The patient is aware to call back for any questions or concerns.     CARE AFTER BREAST BIOPSY  1. Leave the dressing on that your doctor applied after the biopsy. It is waterproof. You may bathe, shower and/or swim. The dressing can be removed in 3 days, you will see small strips of tape against your skin on the incision. Do not remove these strips they will gradually fall off in about 2-3 weeks. You may use an ice pack on and off for the first 12-24 hours for comfort.  2. You may want to use a gauze,cloth or similar protection in your bra to prevent rubbing against your dressing and incision. This is not necessary, but you may feel more comfortable doing so.  3. It is recommended that you wear a bra day and night to give support to the breast. This will prevent the weight of the breast from pulling on the incision.  4. Your breast may feel hard and lumpy under the incision. Do not be alarmed. This is the underlying stitching of tissue. Softening of this tissue will occur in time.  5. You may have a follow up appointment or phone follow up in one week after your biopsy. The office phone number is [phone].  6. You will notice about a week or two after your office visit that the strips of the tape on your incision will begin to loosen. These may then be removed.  7. Report to your doctor any of the following:  * Severe pain not relieved by your pain medication  *Redness of the incision  * Drainage from the incision  *Fever greater than 101 degrees  

## 2017-06-15 ENCOUNTER — Telehealth: Payer: Self-pay | Admitting: General Surgery

## 2017-06-15 DIAGNOSIS — C50812 Malignant neoplasm of overlapping sites of left female breast: Secondary | ICD-10-CM | POA: Insufficient documentation

## 2017-06-15 DIAGNOSIS — C801 Malignant (primary) neoplasm, unspecified: Secondary | ICD-10-CM

## 2017-06-15 HISTORY — DX: Malignant (primary) neoplasm, unspecified: C80.1

## 2017-06-15 LAB — CBC WITH DIFFERENTIAL/PLATELET
Basophils Absolute: 0 10*3/uL (ref 0.0–0.2)
Basos: 0 %
EOS (ABSOLUTE): 0.1 10*3/uL (ref 0.0–0.4)
Eos: 1 %
Hematocrit: 43.8 % (ref 34.0–46.6)
Hemoglobin: 15.1 g/dL (ref 11.1–15.9)
Immature Grans (Abs): 0 10*3/uL (ref 0.0–0.1)
Immature Granulocytes: 0 %
Lymphocytes Absolute: 2.9 10*3/uL (ref 0.7–3.1)
Lymphs: 28 %
MCH: 31.4 pg (ref 26.6–33.0)
MCHC: 34.5 g/dL (ref 31.5–35.7)
MCV: 91 fL (ref 79–97)
Monocytes Absolute: 0.6 10*3/uL (ref 0.1–0.9)
Monocytes: 6 %
Neutrophils Absolute: 6.6 10*3/uL (ref 1.4–7.0)
Neutrophils: 65 %
Platelets: 334 10*3/uL (ref 150–379)
RBC: 4.81 x10E6/uL (ref 3.77–5.28)
RDW: 13.1 % (ref 12.3–15.4)
WBC: 10.2 10*3/uL (ref 3.4–10.8)

## 2017-06-15 LAB — CEA: CEA: 1.7 ng/mL (ref 0.0–4.7)

## 2017-06-15 LAB — CANCER ANTIGEN 27.29: CA 27.29: 7.8 U/mL (ref 0.0–38.6)

## 2017-06-15 NOTE — Telephone Encounter (Signed)
Patient notified of biopsy results.  Breast positive.  Axillary node positive on FNA.  Appointment scheduled with Nolon Stalls, MD on May 14.  Likely candidate for neoadjuvant chemotherapy.  The patient will require a clip to be placed in the positive node if she is recommended for neoadjuvant treatment.  She was notified that her CEA, CBC and CA 27-29 were all normal.

## 2017-06-20 ENCOUNTER — Other Ambulatory Visit: Payer: Self-pay

## 2017-06-20 ENCOUNTER — Telehealth: Payer: Self-pay | Admitting: Genetic Counselor

## 2017-06-20 ENCOUNTER — Other Ambulatory Visit: Payer: Self-pay | Admitting: Internal Medicine

## 2017-06-20 ENCOUNTER — Encounter: Payer: Self-pay | Admitting: *Deleted

## 2017-06-20 ENCOUNTER — Inpatient Hospital Stay: Payer: Managed Care, Other (non HMO) | Attending: Hematology and Oncology | Admitting: Hematology and Oncology

## 2017-06-20 ENCOUNTER — Encounter: Payer: Self-pay | Admitting: Hematology and Oncology

## 2017-06-20 ENCOUNTER — Inpatient Hospital Stay: Payer: Managed Care, Other (non HMO)

## 2017-06-20 VITALS — BP 122/80 | HR 101 | Temp 97.4°F | Resp 18 | Ht 65.0 in | Wt 196.1 lb

## 2017-06-20 DIAGNOSIS — Z780 Asymptomatic menopausal state: Secondary | ICD-10-CM

## 2017-06-20 DIAGNOSIS — Z794 Long term (current) use of insulin: Secondary | ICD-10-CM

## 2017-06-20 DIAGNOSIS — D390 Neoplasm of uncertain behavior of uterus: Secondary | ICD-10-CM | POA: Insufficient documentation

## 2017-06-20 DIAGNOSIS — Z170 Estrogen receptor positive status [ER+]: Secondary | ICD-10-CM | POA: Insufficient documentation

## 2017-06-20 DIAGNOSIS — R221 Localized swelling, mass and lump, neck: Secondary | ICD-10-CM | POA: Insufficient documentation

## 2017-06-20 DIAGNOSIS — Z79899 Other long term (current) drug therapy: Secondary | ICD-10-CM | POA: Insufficient documentation

## 2017-06-20 DIAGNOSIS — F17210 Nicotine dependence, cigarettes, uncomplicated: Secondary | ICD-10-CM | POA: Diagnosis not present

## 2017-06-20 DIAGNOSIS — C50812 Malignant neoplasm of overlapping sites of left female breast: Secondary | ICD-10-CM

## 2017-06-20 DIAGNOSIS — C773 Secondary and unspecified malignant neoplasm of axilla and upper limb lymph nodes: Secondary | ICD-10-CM | POA: Diagnosis not present

## 2017-06-20 DIAGNOSIS — E119 Type 2 diabetes mellitus without complications: Secondary | ICD-10-CM

## 2017-06-20 DIAGNOSIS — Z803 Family history of malignant neoplasm of breast: Secondary | ICD-10-CM

## 2017-06-20 DIAGNOSIS — Z01810 Encounter for preprocedural cardiovascular examination: Secondary | ICD-10-CM

## 2017-06-20 DIAGNOSIS — C50912 Malignant neoplasm of unspecified site of left female breast: Secondary | ICD-10-CM | POA: Diagnosis not present

## 2017-06-20 LAB — COMPREHENSIVE METABOLIC PANEL
ALT: 13 U/L — ABNORMAL LOW (ref 14–54)
AST: 14 U/L — ABNORMAL LOW (ref 15–41)
Albumin: 3.9 g/dL (ref 3.5–5.0)
Alkaline Phosphatase: 90 U/L (ref 38–126)
Anion gap: 9 (ref 5–15)
BUN: 10 mg/dL (ref 6–20)
CO2: 25 mmol/L (ref 22–32)
Calcium: 9.3 mg/dL (ref 8.9–10.3)
Chloride: 102 mmol/L (ref 101–111)
Creatinine, Ser: 0.49 mg/dL (ref 0.44–1.00)
GFR calc Af Amer: >60 mL/min (ref >60)
GFR calc non Af Amer: >60 mL/min (ref >60)
Glucose, Bld: 198 mg/dL — ABNORMAL HIGH (ref 65–99)
Potassium: 4 mmol/L (ref 3.5–5.1)
Sodium: 136 mmol/L (ref 135–145)
Total Bilirubin: 0.7 mg/dL (ref 0.3–1.2)
Total Protein: 7.5 g/dL (ref 6.5–8.1)

## 2017-06-20 NOTE — Telephone Encounter (Signed)
Honor Loh, NP is referring Carolyn Roy for genetic counseling due to a personal and family history of cancer. I left her a message to call and schedule this telegenetics visit to be done by phone at her convenience.   Steele Berg, [place], [place] Genetic Counselor Phone: [phone]

## 2017-06-20 NOTE — Progress Notes (Signed)
[facility] day:  06/20/2017  Chief Complaint: Carolyn Roy is a 52 y.o. female with clinical T3N1Mx left breast cancer who is referred in consultation by Dr. Bary Castilla for assessment and management.  HPI:  The patient presented with a palpable lump in her LEFT breast.  Bilateral mammogram on 06/12/2017 revealed a 3.5 cm irregular mass with associated architectural distortion within the retroareolar left breast centered at the 3-4 o'clock axis.  There was probable surrounding satellite masses extending to overall measurement of 5.5 cm.  There were no masses in the right breast.  Utrasound confirmed a suspicious mass in the left breast as well as a single abnormal lymph node within the left axilla.   She was seen by Dr. Bary Castilla on 06/14/2017.  Exam was notable for a 5 cm mass at 4 o'clock left breast centered at the edge of the areola.  There was no palpable adenopathy.  Ultrasound revealed a 2.4 x 4.0 x 5.1 cm hypoechoic mass in the retroareolar area.  Posterior acoustic shadowing was difficult to assess at it extended to the pectoralis fascia.  Examination of the axilla confirmed a prominent lymph node measuring 1.06 x 1.32 x 1.33 cm.  Cortical thickening measured up to 0.47 cm.  She underwent left breast and axillary node biopsy.  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  Axillary FNA revealed malignant cells c/w metastatic carcinoma.  ER, PR, and Her2/neu are pending.  Discussions were held regarding neoadjuvant chemotherapy as well as upfront mastectomy.  Screening labs were sent to [facility].  CBC revealed a hematocrit of 43.8, hemoglobin 15.1, MCV 91, platelets 334,000, WBC 10,200 with an ANC of 6600.  CA27.29 was 7.8.  CEA was 1.7.  Symptomatically, patient denies pain and nipple discharge. She notes that her skin dimples. She feels "fine". She had some nausea over the last 2-3 days. Patient has chronic numbness and weakness  in her RIGHT upper extremity.  She was prescribed cyclobenzaprine and prednisone back in April by her PCP.  Symptoms have improved somewhat. Patient has no focal weakness in her RIGHT upper extremity today.   Patient has knee pain.  She describes the sensation of a "pin sticking her" in the medial aspect of her RIGHT knee. Patient denies pain in the clinic today. She denies any B symptoms or interval infections. Patient does not perform a monthly self breast examinations.   She is s/p oophorectomy and tubal ligation.  Family history is notable for breast cancer in maternal grandmother (age 76), maternal aunt (age 85), and maternal great aunts x 2 (age 59).  Menarche was as the age 58.  Pregnancies: G3P3 (ages 51, 36, 16 - in good health).  She did not breastfeed her children. She used contraceptives for a short period. She denies other hormonal therapies during her lifetime.  LMP was 2 months ago.   Past Medical History:  Diagnosis Date  . Diabetes mellitus without complication ([place])   . Endometriosis   . Hyperlipidemia   . Pneumonia     Past Surgical History:  Procedure Laterality Date  . OOPHORECTOMY    . TUBAL LIGATION      Family History  Problem Relation Age of Onset  . Diabetes Brother   . Pancreatitis Brother   . Cancer Maternal Grandmother        breast and lung  . Breast cancer Maternal Grandmother 81  . Breast cancer Maternal Aunt 59  . Breast cancer Maternal  Aunt 97       great aunt    Social History:  reports that she has been smoking cigarettes.  She has a 5.50 pack-year smoking history. She has quit using smokeless tobacco. Her smokeless tobacco use included snuff. She reports that she does not drink alcohol or use drugs.  She smokes 0.5-1 ppd x 11 years. She works at [employer] in [place].  Patient denies known exposures to radiation on toxins. Her fiance is Judee Clara.  The patient is accompanied by her fiance today.  Allergies:  Allergies   Allergen Reactions  . Aspirin Nausea And Vomiting    Current Medications: Current Outpatient Medications  Medication Sig Dispense Refill  . albuterol (PROVENTIL HFA;VENTOLIN HFA) 108 (90 Base) MCG/ACT inhaler Inhale 2 puffs into the lungs every 6 (six) hours as needed for wheezing or shortness of breath. 1 Inhaler 0  . atorvastatin (LIPITOR) 40 MG tablet Take 1 tablet (40 mg total) by mouth daily. 90 tablet 3  . blood glucose meter kit and supplies KIT Dispense based on patient and insurance preference. Use up to four times daily as directed. E11.9 1 each 0  . canagliflozin (INVOKANA) 100 MG TABS tablet TAKE 1 TABLET (100 MG TOTAL) BY MOUTH DAILY BEFORE BREAKFAST. 30 tablet 0  . glucose blood (ONE TOUCH ULTRA TEST) test strip Use up to 4 times/day 100 each 12  . Insulin Glargine (BASAGLAR KWIKPEN) 100 UNIT/ML SOPN Inject 0.24 mLs (24 Units total) into the skin at bedtime. 4 pen 2  . Insulin Pen Needle 32G X 4 MM MISC 1 Units by Does not apply route every morning. Pen needles 90 each 3  . metFORMIN (GLUCOPHAGE) 500 MG tablet TAKE 2 TABLETS (1,000 MG TOTAL) BY MOUTH 2 (TWO) TIMES DAILY WITH A MEAL. 120 tablet 1  . ONETOUCH DELICA LANCETS 56L MISC USE TO TEST BLOOD SUGAR LEVELS 4 TIMES DAILY 100 each 12  . Semaglutide (OZEMPIC) 0.25 or 0.5 MG/DOSE SOPN Inject 0.25 mg into the skin once a week. 1 pen 0  . venlafaxine XR (EFFEXOR-XR) 150 MG 24 hr capsule Take 1 capsule (150 mg total) by mouth daily with breakfast. 30 capsule 1  . cyclobenzaprine (FLEXERIL) 10 MG tablet Take 1 tablet (10 mg total) by mouth 3 (three) times daily as needed for muscle spasms. Do not drive on medication (Patient not taking: Reported on 06/20/2017) 60 tablet 0   No current facility-administered medications for this visit.     Review of Systems  Constitutional: Negative.  Negative for diaphoresis, fever, malaise/fatigue and weight loss.  HENT: Negative.  Negative for congestion, hearing loss, nosebleeds, sinus pain and  sore throat.   Eyes: Negative.  Negative for blurred vision, double vision, photophobia and redness.  Respiratory: Positive for cough (related to smoking) and shortness of breath (exertional). Negative for hemoptysis and sputum production.   Cardiovascular: Negative.  Negative for chest pain, palpitations, orthopnea, leg swelling and PND.  Gastrointestinal: Positive for constipation, nausea and vomiting. Negative for abdominal pain, blood in stool, diarrhea and melena.  Genitourinary: Negative.  Negative for dysuria, frequency, hematuria and urgency.  Musculoskeletal: Positive for joint pain (knee). Negative for back pain, falls and myalgias.  Skin: Negative.  Negative for itching and rash.       Skin dimpling - LEFT breast. Skin to face and upper body is red from patient going to the tanning bed.   Neurological: Positive for weakness (Numbness and weakness of RUE) and headaches (h/o migraines). Negative  for dizziness and tremors.  Endo/Heme/Allergies: Does not bruise/bleed easily.       T2DM; denies neuropathy  Psychiatric/Behavioral: Negative.  Negative for depression, memory loss and suicidal ideas. The patient is not nervous/anxious and does not have insomnia.   All other systems reviewed and are negative.  Physical Exam: Blood pressure 122/80, pulse (!) 101, temperature (!) 97.4 F (36.3 C), resp. rate 18, height _0  (1.651 m), weight 196 lb 1.6 oz (89 kg). GENERAL:  Well developed, well nourished, woman sitting comfortably in the exam room in no acute distress. MENTAL STATUS:  Alert and oriented to person, place and time. HEAD:  Long auburn hair with graying.  Normocephalic, atraumatic, face symmetric, no Cushingoid features. EYES:  Hazel eyes.  Pupils equal round and reactive to light and accomodation.  No conjunctivitis or scleral icterus. ENT:  Oropharynx clear without lesion.  Tongue normal. Mucous membranes moist.  RESPIRATORY:  Clear to auscultation without rales, wheezes or  rhonchi. CARDIOVASCULAR:  Regular rate and rhythm without murmur, rub or gallop. BREAST:  Right breast without masses, skin changes or nipple discharge.  Left breast with 5 cm central mass. No skin changes or nipple discharge.  ABDOMEN:  Soft, non-tender, with active bowel sounds, and no hepatosplenomegaly.  No masses. SKIN:  No rashes, ulcers or lesions. EXTREMITIES: No edema, no skin discoloration or tenderness.  No palpable cords. LYMPH NODES: No palpable cervical, supraclavicular, axillary or inguinal adenopathy  NEUROLOGICAL: Alert & oriented, cranial nerves II-XII intact; motor strength 5/5 throughout; sensation intact; finger to nose and RAM normal; no clonus or Babinski.  PSYCH:  Appropriate.   No visits with results within 3 Day(s) from this visit.  Latest known visit with results is:  Office Visit on 06/14/2017  Component Date Value Ref Range Status  . CEA 06/14/2017 1.7  0.0 - 4.7 ng/mL Final   Comment:                              Nonsmokers          <3.9                              Smokers             <5.6 Roche [employer] Electrochemiluminescence Immunoassay (ECLIA) Values obtained with different assay methods or kits cannot be used interchangeably.  Results cannot be interpreted as absolute evidence of the presence or absence of malignant disease.   [name] CA 27.29 06/14/2017 7.8  0.0 - 38.6 U/mL Final   Comment: Siemens Centaur Immunochemiluminometric Methodology [facility]) Values obtained with different assay methods or kits cannot be used interchangeably. Results cannot be interpreted as absolute evidence of the presence or absence of malignant disease.   . WBC 06/14/2017 10.2  3.4 - 10.8 x10E3/uL Final  . RBC 06/14/2017 4.81  3.77 - 5.28 x10E6/uL Final  . Hemoglobin 06/14/2017 15.1  11.1 - 15.9 g/dL Final  . Hematocrit 06/14/2017 43.8  34.0 - 46.6 % Final  . MCV 06/14/2017 91  79 - 97 fL Final  . MCH 06/14/2017 31.4  26.6 - 33.0 pg Final  . MCHC 06/14/2017 34.5  31.5  - 35.7 g/dL Final  . RDW 06/14/2017 13.1  12.3 - 15.4 % Final  . Platelets 06/14/2017 334  150 - 379 x10E3/uL Final  . Neutrophils 06/14/2017 65  Not Estab. % Final  . Lymphs 06/14/2017  28  Not Estab. % Final  . Monocytes 06/14/2017 6  Not Estab. % Final  . Eos 06/14/2017 1  Not Estab. % Final  . Basos 06/14/2017 0  Not Estab. % Final  . Neutrophils Absolute 06/14/2017 6.6  1.4 - 7.0 x10E3/uL Final  . Lymphocytes Absolute 06/14/2017 2.9  0.7 - 3.1 x10E3/uL Final  . Monocytes Absolute 06/14/2017 0.6  0.1 - 0.9 x10E3/uL Final  . EOS (ABSOLUTE) 06/14/2017 0.1  0.0 - 0.4 x10E3/uL Final  . Basophils Absolute 06/14/2017 0.0  0.0 - 0.2 x10E3/uL Final  . Immature Granulocytes 06/14/2017 0  Not Estab. % Final  . Immature Grans (Abs) 06/14/2017 0.0  0.0 - 0.1 x10E3/uL Final    Assessment:  CECILY LAWHORNE is a 52 y.o. female with clinical stage IIIA (T3N1Mx) left breast cancer s/p biopsy on 06/14/2017.  Pathology revealed grade III invasive ductal carcinoma.  Axillary FNA revealed malignant cells c/w metastatic carcinoma.  ER and PR are pending.  Her2/neu is negative.  CA27.29 was 7.8 on 06/14/2017.  Bilateral mammogram on 06/12/2017 revealed a 3.5 cm irregular mass with associated architectural distortion within the retroareolar left breast centered at the 3-4 o'clock axis.  There was probable surrounding satellite masses extending to overall measurement of 5.5 cm.  There were no masses in the right breast.  Utrasound on 06/14/2017 revealed a 2.4 x 4.0 x 5.1 cm hypoechoic mass in the retroareolar area.  The mass extended to the pectoralis fascia.  There was a prominent lymph node measuring 1.06 x 1.32 x 1.33 cm with cortical thickening.   She has a family history of breast cancer.  She is premenopausal.   Symptomatically, she feels well.  She has chronic intermittent right upper extremity numbness and weakness.  Exam reveals a 5 cm central left breast mass.  Plan: 1.  Labs:  CMP, FSH, estradiol.   [facility] genetic testing. 2.  Discuss clinical stage T3N1 breast cancer. Her2/neu -. ER/PR pending. Treatment is contingent upon tumor hormone receptor status. Discussed neoaduvant treatment with standard dose dense AC every 2 weeks x 4 cycles, followed by weekly Taxol x 12 weeks. Side effects associated with this treatment regimen were reviewed in detail.  Patient will need to consider port-a-cath placement for treatments. Discuss treatment with adjuvant endocrine therapy if tumor is hormone receptor positive. Treatment intent is curative.  3.  Schedule echocardiogram.  4.  Schedule PET scan.  5.  Discuss referral to genetic counselor for further discussions. Patient agrees to referral.  Will send [facility] testing today.  6.  RTC in 1 week for MD assessment and discussion  Addendum:  Tumor is ER + (90%), PR + (30%), Her2/neu - and Ki67 70%.   Honor Loh, NP  06/20/2017, 12:03 PM   I saw and evaluated the patient, participating in the key portions of the service and reviewing pertinent diagnostic studies and records.  I reviewed the nurse practitioner's note and agree with the findings and the plan.  The assessment and plan were discussed with the patient.  Additional diagnostic studies of PET scan are needed to clarify satge and would change the clinical management.  Multiple questions were asked by the patient and answered.   Nolon Stalls, MD 06/20/2017, 12:03 PM

## 2017-06-20 NOTE — Progress Notes (Signed)
  Oncology Nurse Navigator Documentation  Navigator Location: [facility] (06/20/17 1600) Referral date to RadOnc/MedOnc: 06/20/17 (06/20/17 1600) )Navigator Encounter Type: Initial MedOnc (06/20/17 1600)   Abnormal Finding Date: 06/12/17 (06/20/17 1600) Confirmed Diagnosis Date: 06/15/17 (06/20/17 1600)               Patient Visit Type: Initial (06/20/17 1600) Treatment Phase: Pre-Tx/Tx Discussion (06/20/17 1600) Barriers/Navigation Needs: Education (06/20/17 1600) Education: Newly Diagnosed Cancer Education (06/20/17 1600) Interventions: Education (06/20/17 1600)     Education Method: Verbal;Written (06/20/17 1600)  Support Groups/Services: Breast Support Group (06/20/17 1600)             Time Spent with Patient: 30 (06/20/17 1600)   Met patient and her fiance today during her initial consult with Dr. Mike Gip.  Patient is newly diagnosed with invasive left breast cancer.  Per patient and fiance, they are here to discuss possible chemotherapy.  Gave patient breast cancer educational literature, "My Breast Cancer Treatment Handbook" by Josephine Igo, RN.  Reviewed cancer center resources with patient.  She is to call if she has any questions or needs.

## 2017-06-20 NOTE — Progress Notes (Signed)
Patient here for evaluation.

## 2017-06-21 ENCOUNTER — Encounter: Payer: Self-pay | Admitting: *Deleted

## 2017-06-21 LAB — ESTRADIOL: Estradiol: 421.4 pg/mL

## 2017-06-21 LAB — FOLLICLE STIMULATING HORMONE: FSH: 3 m[IU]/mL

## 2017-06-22 ENCOUNTER — Other Ambulatory Visit: Payer: Self-pay | Admitting: Urgent Care

## 2017-06-22 ENCOUNTER — Telehealth: Payer: Self-pay | Admitting: *Deleted

## 2017-06-23 ENCOUNTER — Other Ambulatory Visit: Payer: Self-pay | Admitting: Unknown Physician Specialty

## 2017-06-23 ENCOUNTER — Other Ambulatory Visit: Payer: Self-pay | Admitting: Hematology and Oncology

## 2017-06-23 DIAGNOSIS — C50812 Malignant neoplasm of overlapping sites of left female breast: Secondary | ICD-10-CM

## 2017-06-23 NOTE — Progress Notes (Signed)
START ON PATHWAY REGIMEN - Breast   Doxorubicin + Cyclophosphamide (AC):   A cycle is every 21 days:     Doxorubicin      Cyclophosphamide   **Always confirm dose/schedule in your pharmacy ordering system**    Paclitaxel 80 mg/m2 Weekly:   Administer weekly:     Paclitaxel   **Always confirm dose/schedule in your pharmacy ordering system**    Patient Characteristics: Preoperative or Nonsurgical Candidate (Clinical Staging), Neoadjuvant Therapy followed by Surgery, Invasive Disease, Chemotherapy, HER2 Negative/Unknown/Equivocal, ER Positive Therapeutic Status: Preoperative or Nonsurgical Candidate (Clinical Staging) AJCC M Category: cM0 AJCC Grade: G3 Breast Surgical Plan: Neoadjuvant Therapy followed by Surgery ER Status: Positive (+) AJCC 8 Stage Grouping: IIIA HER2 Status: Negative (-) AJCC T Category: cT3 AJCC N Category: cN1 PR Status: Positive (+) Intent of Therapy: Curative Intent, Discussed with Patient

## 2017-06-23 NOTE — Telephone Encounter (Signed)
Called and made patient aware of her scheduled  time for her  PET scan/ECHO and RTC date. She is aware of the location, date and time of all appts. Scheduled. A reminder letter was also mailed out.

## 2017-06-23 NOTE — Telephone Encounter (Signed)
Another message was left today for Carolyn Roy regarding scheduling genetic counseling. Her insurance, [name], requires genetic counseling be done for them to cover testing costs.

## 2017-06-26 ENCOUNTER — Other Ambulatory Visit: Payer: Self-pay | Admitting: Hematology and Oncology

## 2017-06-26 ENCOUNTER — Other Ambulatory Visit: Payer: Self-pay | Admitting: Unknown Physician Specialty

## 2017-06-26 DIAGNOSIS — C50812 Malignant neoplasm of overlapping sites of left female breast: Secondary | ICD-10-CM

## 2017-06-27 ENCOUNTER — Other Ambulatory Visit: Payer: Self-pay | Admitting: Hematology and Oncology

## 2017-06-27 ENCOUNTER — Telehealth: Payer: Self-pay | Admitting: *Deleted

## 2017-06-27 ENCOUNTER — Other Ambulatory Visit: Payer: Self-pay | Admitting: Urgent Care

## 2017-06-27 ENCOUNTER — Ambulatory Visit: Payer: Managed Care, Other (non HMO)

## 2017-06-27 DIAGNOSIS — C50812 Malignant neoplasm of overlapping sites of left female breast: Secondary | ICD-10-CM

## 2017-06-27 NOTE — Telephone Encounter (Signed)
Last message today trying to reach Carolyn Roy to schedule genetic counseling. Per [employer], this needs to get done prior to approval of genetic testing. Her blood will be held at [facility], but results will not be released.

## 2017-06-27 NOTE — Progress Notes (Signed)
PET scan denied by [facility] citing that patient has not had standard imaging. Discussed with Dr. Mike Gip. Will order CT of chest, abdomen and pelvis as baseline imaging to assess for disease metastasis. Financials made aware or request for expedited review and approval.   Honor Loh, MSN, APRN, FNP-C, CEN Oncology/Hematology Nurse Practitioner  [facility] 06/27/17, 3:55 PM

## 2017-06-27 NOTE — Telephone Encounter (Signed)
CCalled patient and LVM that [employer] has been trying to reach her.  Asked her to return Ofri's call @ [phone]

## 2017-06-27 NOTE — Telephone Encounter (Signed)
Per Gaspar Bidding 06/27/17 staff massege to schedule CT CAP  She is scheduled for her CT CAP for 06/29/17 @ 9:00 arrive by 8:45 In [place], her ECHO is scheduled for tomorrow 06/28/17 @ 10:00 @ [facility].  And to RTC on 06/29/17 to See MD @ 2:00. I called patient and made her aware her New (CT CAP) appt that was added to her schedule. She is aware that is must pick up prep tomorrow and also of the location, dates and time of her scheduled appts. She asked if I could Fax or email her schedule to her, I made her aware that we are not allowed to email or  Fax any patient information out to them. Her My Chart is currently pending. Patient said that she understood and would most likely come by to pick up a copy of her schedule tomorrow since it was to late to mail it.

## 2017-06-28 ENCOUNTER — Ambulatory Visit
Admission: RE | Admit: 2017-06-28 | Discharge: 2017-06-28 | Disposition: A | Discharge status: Home / Self Care | Payer: Managed Care, Other (non HMO) | Source: Ambulatory Visit | Attending: Urgent Care | Admitting: Urgent Care

## 2017-06-28 DIAGNOSIS — C50812 Malignant neoplasm of overlapping sites of left female breast: Secondary | ICD-10-CM | POA: Diagnosis not present

## 2017-06-28 DIAGNOSIS — E119 Type 2 diabetes mellitus without complications: Secondary | ICD-10-CM | POA: Insufficient documentation

## 2017-06-28 DIAGNOSIS — Z01810 Encounter for preprocedural cardiovascular examination: Secondary | ICD-10-CM | POA: Insufficient documentation

## 2017-06-28 DIAGNOSIS — E785 Hyperlipidemia, unspecified: Secondary | ICD-10-CM | POA: Diagnosis not present

## 2017-06-28 NOTE — Progress Notes (Signed)
*  PRELIMINARY RESULTS* Echocardiogram 2D Echocardiogram has been performed.  Carolyn Roy 06/28/2017, 10:26 AM

## 2017-06-29 ENCOUNTER — Encounter: Payer: Self-pay | Admitting: Hematology and Oncology

## 2017-06-29 ENCOUNTER — Inpatient Hospital Stay (HOSPITAL_BASED_OUTPATIENT_CLINIC_OR_DEPARTMENT_OTHER): Payer: Managed Care, Other (non HMO) | Admitting: Hematology and Oncology

## 2017-06-29 ENCOUNTER — Ambulatory Visit
Admission: RE | Admit: 2017-06-29 | Discharge: 2017-06-29 | Disposition: A | Discharge status: Home / Self Care | Payer: Managed Care, Other (non HMO) | Source: Ambulatory Visit | Attending: Urgent Care | Admitting: Urgent Care

## 2017-06-29 ENCOUNTER — Other Ambulatory Visit: Payer: Self-pay | Admitting: Hematology and Oncology

## 2017-06-29 VITALS — BP 116/82 | HR 92 | Temp 98.0°F | Resp 18 | Wt 192.1 lb

## 2017-06-29 DIAGNOSIS — E119 Type 2 diabetes mellitus without complications: Secondary | ICD-10-CM | POA: Diagnosis not present

## 2017-06-29 DIAGNOSIS — Z803 Family history of malignant neoplasm of breast: Secondary | ICD-10-CM

## 2017-06-29 DIAGNOSIS — Z170 Estrogen receptor positive status [ER+]: Secondary | ICD-10-CM

## 2017-06-29 DIAGNOSIS — C50912 Malignant neoplasm of unspecified site of left female breast: Secondary | ICD-10-CM | POA: Diagnosis not present

## 2017-06-29 DIAGNOSIS — R590 Localized enlarged lymph nodes: Secondary | ICD-10-CM | POA: Insufficient documentation

## 2017-06-29 DIAGNOSIS — Z794 Long term (current) use of insulin: Secondary | ICD-10-CM | POA: Diagnosis not present

## 2017-06-29 DIAGNOSIS — C773 Secondary and unspecified malignant neoplasm of axilla and upper limb lymph nodes: Secondary | ICD-10-CM | POA: Diagnosis not present

## 2017-06-29 DIAGNOSIS — F17210 Nicotine dependence, cigarettes, uncomplicated: Secondary | ICD-10-CM

## 2017-06-29 DIAGNOSIS — N888 Other specified noninflammatory disorders of cervix uteri: Secondary | ICD-10-CM

## 2017-06-29 DIAGNOSIS — Z79899 Other long term (current) drug therapy: Secondary | ICD-10-CM | POA: Diagnosis not present

## 2017-06-29 DIAGNOSIS — C50812 Malignant neoplasm of overlapping sites of left female breast: Secondary | ICD-10-CM | POA: Diagnosis not present

## 2017-06-29 DIAGNOSIS — R221 Localized swelling, mass and lump, neck: Secondary | ICD-10-CM | POA: Diagnosis not present

## 2017-06-29 DIAGNOSIS — N6320 Unspecified lump in the left breast, unspecified quadrant: Secondary | ICD-10-CM | POA: Diagnosis not present

## 2017-06-29 MED ORDER — IOPAMIDOL (ISOVUE-300) INJECTION 61%
100.0000 mL | Freq: Once | INTRAVENOUS | Status: AC | PRN
  Administered 2017-06-29: 100 mL via INTRAVENOUS

## 2017-06-29 NOTE — Patient Instructions (Signed)
Please contact genetic counselor Beatriz Chancellor Cheral Marker) to discuss genetic testing that was done in the cancer center. Her direct number is [phone].   Honor Loh, MSN, APRN, FNP-C, CEN Oncology/Hematology Nurse Practitioner  [facility] 06/29/17, 3:00 PM

## 2017-06-29 NOTE — Progress Notes (Signed)
[facility] day:  06/29/2017  Chief Complaint: Carolyn Roy is a 52 y.o. female with clinical T3N1Mx left breast cancer who is review of interval studies and discussion regarding direction of therapy.  HPI:  The patient was last seen in the medical oncology clinic on 06/20/2017 for initial consultation.  She had clinical stage IIIA left breast cancer s/p biopsy.  Pathology revealed grade III invasive ductal carcinoma.  Axillary FNA revealed malignant cells. Tumor was ER +, PR +, and Her2/neu -.  CA27.29 was normal.  FSH and estradiol confirmed premenopausal status.  We discussed baseline imaging studies.  PET scan was denied.  She was scheduled for chest, abdomen, and pelvic CT.  Echo on 06/28/2017 revealed an EF of 60-65%.  [employer] testing was sent, which is on hold until she meets with genetic counselor.  She was scheduled to meet with the genetic counselor, however she has not met with them yet.   Port-a-cath placement was discussed with surgery, however it has not been scheduled.  Chemotherapy class is scheduled for 07/04/2017.  Patient had CT imaging of her chest, abdomen, and pelvis prior to today's clinic visit.  LEFT breast mass measured 3.7 x 4.4 x 4.2 cm.  There were borderline enlarged LEFT axillary and left internal mammary lymph nodes.  There was an infiltrative heterogeneously enhancing mass in the cervical region extending into the lower uterine segment that was concerning for a primary cervical carcinoma.  Pelvic ultrasound was recommended.  During the interim, patient notes that her energy is low. She suffers from chronic migraines.  Her headaches have been more frequent due to increased stress. Patient notes that her last pelvic exam was "many many years ago" in [place].  She complains of pain and swelling in her LEFT breast and axilla. She notes contralateral axillary tenderness. She denies nipple inversion and discharge.    Patient denies fevers, sweats, and significant weight loss. She is eating well.  Weight is down 4 pounds  Patient complains of pain rated 6/10 today.    Past Medical History:  Diagnosis Date  . Diabetes mellitus without complication ([place])   . Endometriosis   . Hyperlipidemia   . Pneumonia     Past Surgical History:  Procedure Laterality Date  . OOPHORECTOMY    . TUBAL LIGATION      Family History  Problem Relation Age of Onset  . Diabetes Brother   . Pancreatitis Brother   . Cancer Maternal Grandmother        breast and lung  . Breast cancer Maternal Grandmother 9  . Breast cancer Maternal Aunt 31  . Breast cancer Maternal Aunt 18       great aunt    Social History:  reports that she has been smoking cigarettes.  She has a 5.50 pack-year smoking history. She has quit using smokeless tobacco. Her smokeless tobacco use included snuff. She reports that she does not drink alcohol or use drugs.  She smokes 0.5-1 ppd x 11 years. She works at [employer] in [place].  Patient denies known exposures to radiation on toxins. Her fiance is Judee Clara.  The patient is accompanied by her fiance today.  Allergies:  Allergies  Allergen Reactions  . Aspirin Nausea And Vomiting    Current Medications: Current Outpatient Medications  Medication Sig Dispense Refill  . albuterol (PROVENTIL HFA;VENTOLIN HFA) 108 (90 Base) MCG/ACT inhaler Inhale 2 puffs into the lungs every 6 (six) hours  as needed for wheezing or shortness of breath. 1 Inhaler 0  . atorvastatin (LIPITOR) 40 MG tablet Take 1 tablet (40 mg total) by mouth daily. 90 tablet 3  . blood glucose meter kit and supplies KIT Dispense based on patient and insurance preference. Use up to four times daily as directed. E11.9 1 each 0  . canagliflozin (INVOKANA) 100 MG TABS tablet TAKE 1 TABLET (100 MG TOTAL) BY MOUTH DAILY BEFORE BREAKFAST. 30 tablet 0  . glucose blood (ONE TOUCH ULTRA TEST) test strip Use up to 4  times/day 100 each 12  . Insulin Glargine (BASAGLAR KWIKPEN) 100 UNIT/ML SOPN INJECT 0.24 MLS (24 UNITS TOTAL) INTO THE SKIN AT BEDTIME. 15 pen 2  . Insulin Pen Needle 32G X 4 MM MISC 1 Units by Does not apply route every morning. Pen needles 90 each 3  . metFORMIN (GLUCOPHAGE) 500 MG tablet TAKE 2 TABLETS (1,000 MG TOTAL) BY MOUTH 2 (TWO) TIMES DAILY WITH A MEAL. 120 tablet 1  . ONETOUCH DELICA LANCETS 81X MISC USE TO TEST BLOOD SUGAR LEVELS 4 TIMES DAILY 100 each 12  . Semaglutide (OZEMPIC) 0.25 or 0.5 MG/DOSE SOPN Inject 0.25 mg into the skin once a week. 1 pen 0  . venlafaxine XR (EFFEXOR-XR) 150 MG 24 hr capsule TAKE 1 CAPSULE (150 MG TOTAL) BY MOUTH DAILY WITH BREAKFAST. 90 capsule 1   No current facility-administered medications for this visit.     Review of Systems  Constitutional: Positive for malaise/fatigue. Negative for diaphoresis, fever and weight loss.  HENT: Negative.  Negative for congestion, hearing loss, nosebleeds, sinus pain and sore throat.   Eyes: Negative.  Negative for blurred vision, double vision, photophobia and redness.  Respiratory: Positive for cough (related to smoking) and shortness of breath (exertional). Negative for hemoptysis and sputum production.   Cardiovascular: Negative.  Negative for chest pain, palpitations, orthopnea, leg swelling and PND.  Gastrointestinal: Positive for constipation, nausea and vomiting. Negative for abdominal pain, blood in stool, diarrhea and melena.  Genitourinary: Negative.  Negative for dysuria, frequency, hematuria and urgency.  Musculoskeletal: Positive for joint pain (knee). Negative for back pain, falls and myalgias.  Skin: Negative.  Negative for itching and rash.       Skin dimpling - LEFT breast. Face and upper body is red from patient going to the tanning bed.   Neurological: Positive for weakness (Numbness and weakness of RUE) and headaches (h/o migraines). Negative for dizziness and tremors.  Endo/Heme/Allergies:  Does not bruise/bleed easily.       T2DM; denies neuropathy  Psychiatric/Behavioral: Negative.  Negative for depression, memory loss and suicidal ideas. The patient is not nervous/anxious and does not have insomnia.   All other systems reviewed and are negative.  Performance status (ECOG): 1 - Symptomatic but completely ambulatory  Physical Exam: Blood pressure 116/82, pulse 92, temperature 98 F (36.7 C), temperature source Tympanic, resp. rate 18, weight 192 lb 2 oz (87.1 kg). GENERAL:  Well developed, well nourished, woman sitting comfortably in the exam room in no acute distress. MENTAL STATUS:  Alert and oriented to person, place and time. HEAD:  Long auburn hair.  Normocephalic, atraumatic, face symmetric, no Cushingoid features. EYES:  Hazel eyes.  No conjunctivitis or scleral icterus. SKIN:  Reddish tan.  No rashes, ulcers or lesions. EXTREMITIES: No edema, no skin discoloration or tenderness.  NEUROLOGICAL: Unremarkable. PSYCH:  Appropriate.    No visits with results within 3 Day(s) from this visit.  Latest known visit with results is:  Appointment on 06/20/2017  Component Date Value Ref Range Status  . Sodium 06/20/2017 136  135 - 145 mmol/L Final  . Potassium 06/20/2017 4.0  3.5 - 5.1 mmol/L Final  . Chloride 06/20/2017 102  101 - 111 mmol/L Final  . CO2 06/20/2017 25  22 - 32 mmol/L Final  . Glucose, Bld 06/20/2017 198* 65 - 99 mg/dL Final  . BUN 06/20/2017 10  6 - 20 mg/dL Final  . Creatinine, Ser 06/20/2017 0.49  0.44 - 1.00 mg/dL Final  . Calcium 06/20/2017 9.3  8.9 - 10.3 mg/dL Final  . Total Protein 06/20/2017 7.5  6.5 - 8.1 g/dL Final  . Albumin 06/20/2017 3.9  3.5 - 5.0 g/dL Final  . AST 06/20/2017 14* 15 - 41 U/L Final  . ALT 06/20/2017 13* 14 - 54 U/L Final  . Alkaline Phosphatase 06/20/2017 90  38 - 126 U/L Final  . Total Bilirubin 06/20/2017 0.7  0.3 - 1.2 mg/dL Final  . GFR calc non Af Amer 06/20/2017 >60  >60 mL/min Final  . GFR calc Af Amer 06/20/2017  >60  >60 mL/min Final   Comment: (NOTE) The eGFR has been calculated using the CKD EPI equation. This calculation has not been validated in all clinical situations. eGFR's persistently <60 mL/min signify possible Chronic Kidney Disease.   Georgiann Hahn gap 06/20/2017 9  5 - 15 Final   Performed at [facility], [address] [place], [place] [address]  . [facility] 06/20/2017 3.0  mIU/mL Final   Comment: (NOTE)                    Adult Female:                      Follicular phase      3.5 -  12.5                      Ovulation phase       4.7 -  21.5                      Luteal phase          1.7 -   7.7                      Postmenopausal       25.8 - 134.8 Performed At: [facility] [place], [place] [id number] Rush Farmer MD KG:[id number] Performed at [facility], [address] [place], [place] [address]   . Estradiol 06/20/2017 421.4  pg/mL Final   Comment: (NOTE)                    Adult Female:                      Follicular phase   85.8 -   166.0                      Ovulation phase    85.8 -   498.0                      Luteal phase       43.8 -   211.0                      Postmenopausal     <6.0 -  54.7                    Pregnancy                      1st trimester     215.0 - >4300.0                    Girls (1-10 years)    6.0 -    27.0 Roche ECLIA methodology Performed At: [facility] [place], [place] [id number] Rush Farmer MD GQ:[id number] Performed at [facility], [address] [place], [place] [address]     Assessment:  ARIDAY BRINKER is a 52 y.o. female with clinical stage IIIA (T3N1Mx) left breast cancer s/p biopsy on 06/14/2017.  Pathology revealed grade III invasive ductal carcinoma.  Axillary FNA revealed malignant cells c/w metastatic carcinoma. Tumor was ER + (90%), PR + (30%), Her2/neu - and Ki67 70%.  CA27.29 was 7.8 on 06/14/2017.  Bilateral mammogram on 06/12/2017 revealed  a 3.5 cm irregular mass with associated architectural distortion within the retroareolar left breast centered at the 3-4 o'clock axis.  There was probable surrounding satellite masses extending to overall measurement of 5.5 cm.  There were no masses in the right breast.  Utrasound on 06/14/2017 revealed a 2.4 x 4.0 x 5.1 cm hypoechoic mass in the retroareolar area.  The mass extended to the pectoralis fascia.  There was a prominent lymph node measuring 1.06 x 1.32 x 1.33 cm with cortical thickening.   Echo on 06/28/2017 revealed an EF of 60-65%  CT  chest, abdomen, and pelvis on 06/29/2017 revealed a LEFT breast mass measuring approximately 3.7 x 4.4 x 4.2 cm. There were borderline enlarged LEFT axillary and left internal mammary lymph nodes.  There was an infiltrative heterogeneously enhancing mass in the cervical region extending into the lower uterine segment that was concerning for a primary cervical carcinoma.  She has a family history of breast cancer.  She is premenopausal.   Symptomatically, she feels well.  She has chronic intermittent right upper extremity numbness and weakness.  Exam reveals a 5 cm central left breast mass.  Plan: 1. Discuss chest, abdomen, and pelvic CT. 2. Discuss echo-normal. 3. Discuss CT scan results:  LEFT breast mass measuring 3.7 x 4.4 x 4.2 cm.  LEFT axillary and internal mammary adenopathy.  Infiltrative heterogeneously enhancing mass in the cervical region concerning for primary cervical carcinoma. 4. Discuss need for GYN evaluation. Will schedule patient to been seen by GYN oncology next week for further evaluation and testing (biopsy).  5. Discuss final pathology from breast biopsy.  Tumor is ER/PR + and Her2/neu-.  Review initial plans for neoaduvant treatment with standard dose dense AC every 2 weeks x 4 cycles, followed by weekly Taxol x 12 weeks. Discuss plan of care with GYN-oncology after evaluation of new cervical mass. 6. Discuss genetic testing.  Given counselor's number to contact. Genetic results are available, however unable to be paid for and released by company until she meets with genetic counselor and documentation is submitted. This is a requirement from her insurance company Psychologist, counselling).  7. Schedule PET scan to assess both breast and likely cervical cancer. 8. Discuss port-a-cath placement.  Phone follow-up with Dr. Bary Castilla.  9. Schedule chemotherapy class.  10. RTC on 07/07/2017 for MD assessment and further discussions regarding treatment (cancel chemotherapy for next week).    Honor Loh, NP  06/29/2017, 3:12 PM  I saw and evaluated the patient, participating in the key portions of the service and reviewing pertinent diagnostic studies and records.  I reviewed the nurse practitioner's note and agree with the findings and the plan.  The assessment and plan were discussed with the patient.  Additional diagnostic studies of PET scan are needed to clarify stage and would change the clinical management.  Multiple questions were asked by the patient and answered.   Nolon Stalls, MD 06/29/2017, 3:12 PM

## 2017-06-29 NOTE — Progress Notes (Signed)
Patient here today for scan results.  

## 2017-06-29 NOTE — Patient Instructions (Signed)
Doxorubicin injection What is this medicine? DOXORUBICIN (dox oh ROO bi sin) is a chemotherapy drug. It is used to treat many kinds of cancer like leukemia, lymphoma, neuroblastoma, sarcoma, and Wilms' tumor. It is also used to treat bladder cancer, breast cancer, lung cancer, ovarian cancer, stomach cancer, and thyroid cancer. This medicine may be used for other purposes; ask your health care provider or pharmacist if you have questions. COMMON BRAND NAME(S): Adriamycin, Adriamycin PFS, Adriamycin RDF, Rubex What should I tell my health care provider before I take this medicine? They need to know if you have any of these conditions: -heart disease -history of low blood counts caused by a medicine -liver disease -recent or ongoing radiation therapy -an unusual or allergic reaction to doxorubicin, other chemotherapy agents, other medicines, foods, dyes, or preservatives -pregnant or trying to get pregnant -breast-feeding How should I use this medicine? This drug is given as an infusion into a vein. It is administered in a hospital or clinic by a specially trained health care professional. If you have pain, swelling, burning or any unusual feeling around the site of your injection, tell your health care professional right away. Talk to your pediatrician regarding the use of this medicine in children. Special care may be needed. Overdosage: If you think you have taken too much of this medicine contact a poison control center or emergency room at once. NOTE: This medicine is only for you. Do not share this medicine with others. What if I miss a dose? It is important not to miss your dose. Call your doctor or health care professional if you are unable to keep an appointment. What may interact with this medicine? This medicine may interact with the following medications: -6-mercaptopurine -paclitaxel -phenytoin -St. John's Wort -trastuzumab -verapamil This list may not describe all possible  interactions. Give your health care provider a list of all the medicines, herbs, non-prescription drugs, or dietary supplements you use. Also tell them if you smoke, drink alcohol, or use illegal drugs. Some items may interact with your medicine. What should I watch for while using this medicine? This drug may make you feel generally unwell. This is not uncommon, as chemotherapy can affect healthy cells as well as cancer cells. Report any side effects. Continue your course of treatment even though you feel ill unless your doctor tells you to stop. There is a maximum amount of this medicine you should receive throughout your life. The amount depends on the medical condition being treated and your overall health. Your doctor will watch how much of this medicine you receive in your lifetime. Tell your doctor if you have taken this medicine before. You may need blood work done while you are taking this medicine. Your urine may turn red for a few days after your dose. This is not blood. If your urine is dark or brown, call your doctor. In some cases, you may be given additional medicines to help with side effects. Follow all directions for their use. Call your doctor or health care professional for advice if you get a fever, chills or sore throat, or other symptoms of a cold or flu. Do not treat yourself. This drug decreases your body's ability to fight infections. Try to avoid being around people who are sick. This medicine may increase your risk to bruise or bleed. Call your doctor or health care professional if you notice any unusual bleeding. Talk to your doctor about your risk of cancer. You may be more at risk for certain   types of cancers if you take this medicine. Do not become pregnant while taking this medicine or for 6 months after stopping it. Women should inform their doctor if they wish to become pregnant or think they might be pregnant. Men should not father a child while taking this medicine and  for 6 months after stopping it. There is a potential for serious side effects to an unborn child. Talk to your health care professional or pharmacist for more information. Do not breast-feed an infant while taking this medicine. This medicine has caused ovarian failure in some women and reduced sperm counts in some men This medicine may interfere with the ability to have a child. Talk with your doctor or health care professional if you are concerned about your fertility. What side effects may I notice from receiving this medicine? Side effects that you should report to your doctor or health care professional as soon as possible: -allergic reactions like skin rash, itching or hives, swelling of the face, lips, or tongue -breathing problems -chest pain -fast or irregular heartbeat -low blood counts - this medicine may decrease the number of white blood cells, red blood cells and platelets. You may be at increased risk for infections and bleeding. -pain, redness, or irritation at site where injected -signs of infection - fever or chills, cough, sore throat, pain or difficulty passing urine -signs of decreased platelets or bleeding - bruising, pinpoint red spots on the skin, black, tarry stools, blood in the urine -swelling of the ankles, feet, hands -tiredness -weakness Side effects that usually do not require medical attention (report to your doctor or health care professional if they continue or are bothersome): -diarrhea -hair loss -mouth sores -nail discoloration or damage -nausea -red colored urine -vomiting This list may not describe all possible side effects. Call your doctor for medical advice about side effects. You may report side effects to FDA at 1-800-FDA-1088. Where should I keep my medicine? This drug is given in a hospital or clinic and will not be stored at home. NOTE: This sheet is a summary. It may not cover all possible information. If you have questions about this  medicine, talk to your doctor, pharmacist, or health care provider.  2018 [employer] Standard (2015-03-23 11:28:51) Cyclophosphamide injection What is this medicine? CYCLOPHOSPHAMIDE (sye kloe FOSS fa mide) is a chemotherapy drug. It slows the growth of cancer cells. This medicine is used to treat many types of cancer like lymphoma, myeloma, leukemia, breast cancer, and ovarian cancer, to name a few. This medicine may be used for other purposes; ask your health care provider or pharmacist if you have questions. COMMON BRAND NAME(S): Cytoxan, Neosar What should I tell my health care provider before I take this medicine? They need to know if you have any of these conditions: -blood disorders -history of other chemotherapy -infection -kidney disease -liver disease -recent or ongoing radiation therapy -tumors in the bone marrow -an unusual or allergic reaction to cyclophosphamide, other chemotherapy, other medicines, foods, dyes, or preservatives -pregnant or trying to get pregnant -breast-feeding How should I use this medicine? This drug is usually given as an injection into a vein or muscle or by infusion into a vein. It is administered in a hospital or clinic by a specially trained health care professional. Talk to your pediatrician regarding the use of this medicine in children. Special care may be needed. Overdosage: If you think you have taken too much of this medicine contact a poison control center or emergency room at   once. NOTE: This medicine is only for you. Do not share this medicine with others. What if I miss a dose? It is important not to miss your dose. Call your doctor or health care professional if you are unable to keep an appointment. What may interact with this medicine? This medicine may interact with the following medications: -amiodarone -amphotericin B -azathioprine -certain antiviral medicines for HIV or AIDS such as protease inhibitors (e.g., indinavir,  ritonavir) and zidovudine -certain blood pressure medications such as benazepril, captopril, enalapril, fosinopril, lisinopril, moexipril, monopril, perindopril, quinapril, ramipril, trandolapril -certain cancer medications such as anthracyclines (e.g., daunorubicin, doxorubicin), busulfan, cytarabine, paclitaxel, pentostatin, tamoxifen, trastuzumab -certain diuretics such as chlorothiazide, chlorthalidone, hydrochlorothiazide, indapamide, metolazone -certain medicines that treat or prevent blood clots like warfarin -certain muscle relaxants such as succinylcholine -cyclosporine -etanercept -indomethacin -medicines to increase blood counts like filgrastim, pegfilgrastim, sargramostim -medicines used as general anesthesia -metronidazole -natalizumab This list may not describe all possible interactions. Give your health care provider a list of all the medicines, herbs, non-prescription drugs, or dietary supplements you use. Also tell them if you smoke, drink alcohol, or use illegal drugs. Some items may interact with your medicine. What should I watch for while using this medicine? Visit your doctor for checks on your progress. This drug may make you feel generally unwell. This is not uncommon, as chemotherapy can affect healthy cells as well as cancer cells. Report any side effects. Continue your course of treatment even though you feel ill unless your doctor tells you to stop. Drink water or other fluids as directed. Urinate often, even at night. In some cases, you may be given additional medicines to help with side effects. Follow all directions for their use. Call your doctor or health care professional for advice if you get a fever, chills or sore throat, or other symptoms of a cold or flu. Do not treat yourself. This drug decreases your body's ability to fight infections. Try to avoid being around people who are sick. This medicine may increase your risk to bruise or bleed. Call your doctor or  health care professional if you notice any unusual bleeding. Be careful brushing and flossing your teeth or using a toothpick because you may get an infection or bleed more easily. If you have any dental work done, tell your dentist you are receiving this medicine. You may get drowsy or dizzy. Do not drive, use machinery, or do anything that needs mental alertness until you know how this medicine affects you. Do not become pregnant while taking this medicine or for 1 year after stopping it. Women should inform their doctor if they wish to become pregnant or think they might be pregnant. Men should not father a child while taking this medicine and for 4 months after stopping it. There is a potential for serious side effects to an unborn child. Talk to your health care professional or pharmacist for more information. Do not breast-feed an infant while taking this medicine. This medicine may interfere with the ability to have a child. This medicine has caused ovarian failure in some women. This medicine has caused reduced sperm counts in some men. You should talk with your doctor or health care professional if you are concerned about your fertility. If you are going to have surgery, tell your doctor or health care professional that you have taken this medicine. What side effects may I notice from receiving this medicine? Side effects that you should report to your doctor or health care professional as   soon as possible: -allergic reactions like skin rash, itching or hives, swelling of the face, lips, or tongue -low blood counts - this medicine may decrease the number of white blood cells, red blood cells and platelets. You may be at increased risk for infections and bleeding. -signs of infection - fever or chills, cough, sore throat, pain or difficulty passing urine -signs of decreased platelets or bleeding - bruising, pinpoint red spots on the skin, black, tarry stools, blood in the urine -signs of  decreased red blood cells - unusually weak or tired, fainting spells, lightheadedness -breathing problems -dark urine -dizziness -palpitations -swelling of the ankles, feet, hands -trouble passing urine or change in the amount of urine -weight gain -yellowing of the eyes or skin Side effects that usually do not require medical attention (report to your doctor or health care professional if they continue or are bothersome): -changes in nail or skin color -hair loss -missed menstrual periods -mouth sores -nausea, vomiting This list may not describe all possible side effects. Call your doctor for medical advice about side effects. You may report side effects to FDA at 1-800-FDA-1088. Where should I keep my medicine? This drug is given in a hospital or clinic and will not be stored at home. NOTE: This sheet is a summary. It may not cover all possible information. If you have questions about this medicine, talk to your doctor, pharmacist, or health care provider.  2018 [employer] Standard (2011-12-09 16:22:58) Paclitaxel injection What is this medicine? PACLITAXEL (PAK li TAX el) is a chemotherapy drug. It targets fast dividing cells, like cancer cells, and causes these cells to die. This medicine is used to treat ovarian cancer, breast cancer, and other cancers. This medicine may be used for other purposes; ask your health care provider or pharmacist if you have questions. COMMON BRAND NAME(S): Onxol, Taxol What should I tell my health care provider before I take this medicine? They need to know if you have any of these conditions: -blood disorders -irregular heartbeat -infection (especially a virus infection such as chickenpox, cold sores, or herpes) -liver disease -previous or ongoing radiation therapy -an unusual or allergic reaction to paclitaxel, alcohol, polyoxyethylated castor oil, other chemotherapy agents, other medicines, foods, dyes, or preservatives -pregnant or trying to  get pregnant -breast-feeding How should I use this medicine? This drug is given as an infusion into a vein. It is administered in a hospital or clinic by a specially trained health care professional. Talk to your pediatrician regarding the use of this medicine in children. Special care may be needed. Overdosage: If you think you have taken too much of this medicine contact a poison control center or emergency room at once. NOTE: This medicine is only for you. Do not share this medicine with others. What if I miss a dose? It is important not to miss your dose. Call your doctor or health care professional if you are unable to keep an appointment. What may interact with this medicine? Do not take this medicine with any of the following medications: -disulfiram -metronidazole This medicine may also interact with the following medications: -cyclosporine -diazepam -ketoconazole -medicines to increase blood counts like filgrastim, pegfilgrastim, sargramostim -other chemotherapy drugs like cisplatin, doxorubicin, epirubicin, etoposide, teniposide, vincristine -quinidine -testosterone -vaccines -verapamil Talk to your doctor or health care professional before taking any of these medicines: -acetaminophen -aspirin -ibuprofen -ketoprofen -naproxen This list may not describe all possible interactions. Give your health care provider a list of all the medicines, herbs, non-prescription drugs, or   dietary supplements you use. Also tell them if you smoke, drink alcohol, or use illegal drugs. Some items may interact with your medicine. What should I watch for while using this medicine? Your condition will be monitored carefully while you are receiving this medicine. You will need important blood work done while you are taking this medicine. This medicine can cause serious allergic reactions. To reduce your risk you will need to take other medicine(s) before treatment with this medicine. If you  experience allergic reactions like skin rash, itching or hives, swelling of the face, lips, or tongue, tell your doctor or health care professional right away. In some cases, you may be given additional medicines to help with side effects. Follow all directions for their use. This drug may make you feel generally unwell. This is not uncommon, as chemotherapy can affect healthy cells as well as cancer cells. Report any side effects. Continue your course of treatment even though you feel ill unless your doctor tells you to stop. Call your doctor or health care professional for advice if you get a fever, chills or sore throat, or other symptoms of a cold or flu. Do not treat yourself. This drug decreases your body's ability to fight infections. Try to avoid being around people who are sick. This medicine may increase your risk to bruise or bleed. Call your doctor or health care professional if you notice any unusual bleeding. Be careful brushing and flossing your teeth or using a toothpick because you may get an infection or bleed more easily. If you have any dental work done, tell your dentist you are receiving this medicine. Avoid taking products that contain aspirin, acetaminophen, ibuprofen, naproxen, or ketoprofen unless instructed by your doctor. These medicines may hide a fever. Do not become pregnant while taking this medicine. Women should inform their doctor if they wish to become pregnant or think they might be pregnant. There is a potential for serious side effects to an unborn child. Talk to your health care professional or pharmacist for more information. Do not breast-feed an infant while taking this medicine. Men are advised not to father a child while receiving this medicine. This product may contain alcohol. Ask your pharmacist or healthcare provider if this medicine contains alcohol. Be sure to tell all healthcare providers you are taking this medicine. Certain medicines, like metronidazole  and disulfiram, can cause an unpleasant reaction when taken with alcohol. The reaction includes flushing, headache, nausea, vomiting, sweating, and increased thirst. The reaction can last from 30 minutes to several hours. What side effects may I notice from receiving this medicine? Side effects that you should report to your doctor or health care professional as soon as possible: -allergic reactions like skin rash, itching or hives, swelling of the face, lips, or tongue -low blood counts - This drug may decrease the number of white blood cells, red blood cells and platelets. You may be at increased risk for infections and bleeding. -signs of infection - fever or chills, cough, sore throat, pain or difficulty passing urine -signs of decreased platelets or bleeding - bruising, pinpoint red spots on the skin, black, tarry stools, nosebleeds -signs of decreased red blood cells - unusually weak or tired, fainting spells, lightheadedness -breathing problems -chest pain -high or low blood pressure -mouth sores -nausea and vomiting -pain, swelling, redness or irritation at the injection site -pain, tingling, numbness in the hands or feet -slow or irregular heartbeat -swelling of the ankle, feet, hands Side effects that usually do not   require medical attention (report to your doctor or health care professional if they continue or are bothersome): -bone pain -complete hair loss including hair on your head, underarms, pubic hair, eyebrows, and eyelashes -changes in the color of fingernails -diarrhea -loosening of the fingernails -loss of appetite -muscle or joint pain -red flush to skin -sweating This list may not describe all possible side effects. Call your doctor for medical advice about side effects. You may report side effects to FDA at 1-800-FDA-1088. Where should I keep my medicine? This drug is given in a hospital or clinic and will not be stored at home. NOTE: This sheet is a summary. It  may not cover all possible information. If you have questions about this medicine, talk to your doctor, pharmacist, or health care provider.  2018 [employer] (2014-11-25 19:58:00)  

## 2017-06-30 ENCOUNTER — Ambulatory Visit: Payer: Managed Care, Other (non HMO) | Admitting: Unknown Physician Specialty

## 2017-06-30 ENCOUNTER — Telehealth: Payer: Self-pay | Admitting: *Deleted

## 2017-06-30 ENCOUNTER — Encounter: Payer: Self-pay | Admitting: Genetic Counselor

## 2017-06-30 ENCOUNTER — Other Ambulatory Visit: Payer: Self-pay | Admitting: *Deleted

## 2017-06-30 ENCOUNTER — Telehealth: Payer: Self-pay | Admitting: Genetic Counselor

## 2017-06-30 DIAGNOSIS — Z803 Family history of malignant neoplasm of breast: Secondary | ICD-10-CM | POA: Insufficient documentation

## 2017-06-30 NOTE — Telephone Encounter (Signed)
Cancer Genetics            Telegenetics Initial Visit    Patient Name: Carolyn Roy Patient DOB: [birth year] Patient Age: 52 y.o. Phone Call Date: 06/30/2017  Referring Provider: Nolon Stalls, MD Honor Loh, NP  Reason for Visit: Evaluate for hereditary susceptibility to cancer    Assessment and Plan:  . Carolyn Roy's personal history of breast cancer at age 86 in combination with her family history of breast cancer in her maternal aunt, maternal aunt and 2 maternal great-aunts is suggestive of a hereditary predisposition to cancer even though her mother is cancer-free. Her mother had a TAH/BSO in her early 29s.   . Testing is recommended to determine whether she has a pathogenic mutation that will impact her surgical decision as well as her screening and risk-reduction for cancer. A negative result will be generally reassuring.  . Carolyn Roy has already had her blood drawn for genetic testing and analysis is underway at [facility]. Analysis will include the 83 genes on [facility]'s Multi-Cancer panel (ALK, APC, ATM, AXIN2, BAP1, BARD1, BLM, BMPR1A, BRCA1, BRCA2, BRIP1, CASR, CDC73, CDH1, CDK4, CDKN1B, CDKN1C, CDKN2A, CEBPA, CHEK2, CTNNA1, DICER1, DIS3L2, EGFR, EPCAM, FH, FLCN, GATA2, GPC3, GREM1, HOXB13, HRAS, KIT, MAX, MEN1, MET, MITF, MLH1, MSH2, MSH3, MSH6, MUTYH, NBN, NF1, NF2, NTHL1, PALB2, PDGFRA, PHOX2B, PMS2, POLD1, POLE, POT1, PRKAR1A, PTCH1, PTEN, RAD50, RAD51C, RAD51D, RB1, RECQL4, RET, RUNX1, SDHA, SDHAF2, SDHB, SDHC, SDHD, SMAD4, SMARCA4, SMARCB1, SMARCE1, STK11, SUFU, TERC, TERT, TMEM127, TP53, TSC1, TSC2, VHL, WRN, WT1).   . Results should be available in approximately 2-3 weeks from when [facility] received her specimen, at which point we will contact her and address implications for her as well as address genetic testing for at-risk family members, if needed.     Dr. Grayland Ormond was available for questions concerning this case. Total time spent by counseling  by phone was approximately 25 minutes.   _____________________________________________________________________   History of Present Illness: Carolyn Roy, a 52 y.o. female, was referred for genetic counseling to discuss the possibility of a hereditary predisposition to cancer and discuss whether genetic testing is warranted. This was a telegenetics visit via phone.  Carolyn Roy was diagnosed with breast cancer at the age of 15. She is currently receiving neoadjuvant chemotherapy.  The breast tumor was ER positive, PR positive, and HER2 negative.    Cancer of midline of breast, left ([place])   06/15/2017 Initial Diagnosis    Cancer of midline of breast, left ([place])      06/26/2017 -  Chemotherapy    The patient had DOXOrubicin (ADRIAMYCIN) chemo injection 122 mg, 60 mg/m2 = 122 mg, Intravenous,  Once, 0 of 4 cycles palonosetron (ALOXI) injection 0.25 mg, 0.25 mg, Intravenous,  Once, 0 of 4 cycles pegfilgrastim (NEULASTA) injection 6 mg, 6 mg, Subcutaneous, Once, 0 of 17 cycles cyclophosphamide (CYTOXAN) 1,220 mg in sodium chloride 0.9 % 250 mL chemo infusion, 600 mg/m2 = 1,220 mg, Intravenous,  Once, 0 of 4 cycles PACLitaxel (TAXOL) 162 mg in sodium chloride 0.9 % 250 mL chemo infusion (</= 72m/m2), 80 mg/m2, Intravenous,  Once, 0 of 12 cycles fosaprepitant (EMEND) 150 mg, dexamethasone (DECADRON) 12 mg in sodium chloride 0.9 % 145 mL IVPB, , Intravenous,  Once, 0 of 4 cycles  for chemotherapy treatment.        Past Medical History:  Diagnosis Date  . [place] mellitus [place] complication [place]   .  Endometriosis   . Hyperlipidemia   . Pneumonia     Past Surgical History:  Procedure Laterality Date  . OOPHORECTOMY    . TUBAL LIGATION      Family History: Significant diagnoses include the following:  Family History  Problem Relation Age of Onset  . Other Father        No info about father or paternal relatives  . Diabetes Brother   . Pancreatitis Brother   . Prostate  cancer Brother 52       currently 67 / maternal half-brother  . Breast cancer Maternal Grandmother 40       deceased 38s  . Breast cancer Maternal Aunt 65       currently 4  . Breast cancer Other 43       mother's sister; deceased 66  . Breast cancer Other        mother's sister; age at dx unknown    Additionally, Carolyn Roy has a daughter (age 4) and 2 sons (ages 31 and 77). She has no full siblings. She has a maternal half-brother (age 66) noted above with prostate cancer. She has another maternal half-brother (ages 56) and a maternal half-sister (age 68). Her mother (age 29) had a TAH/BSO in her early 50s. Her mother had 4 sisters and 2 brothers. Her maternal grandmother (noted above) had 3 sisters and 2 brothers. Two of the 3 sisters had breast cancers as noted above. She has no information about her biological father.  Carolyn Roy ancestry is Caucasian - NOS. There is no known Jewish ancestry and no consanguinity.  Discussion: We reviewed the characteristics, features and inheritance patterns of hereditary cancer syndromes. We discussed her risk of harboring a mutation in the context of her personal and family history. We discussed that her unknown paternal family makes risk assessment challenging. We discussed the process of genetic testing, insurance coverage and implications of results: positive, negative and variant of unknown significance (VUS).   Carolyn Roy questions were answered to her satisfaction today and she is welcome to call with any additional questions or concerns. Thank you for the referral and allowing [place] to share in the care of your patient.    Steele Berg, MS, [place] Certified Genetic Counselor phone: [phone]

## 2017-06-30 NOTE — Telephone Encounter (Signed)
Per [facility] on 06/30/2017.  PET has been denied. I called patient @ 2:50 to make her aware that the PET Scheduled for 07/04/17 was cancelled due to Ins. She is aware. She is still Scheduled for Chemo Edu on 07/04/17 and to RTC on 07/07/17 to see MD per 06/29/17 los.

## 2017-07-04 ENCOUNTER — Telehealth: Payer: Self-pay

## 2017-07-04 ENCOUNTER — Encounter: Admission: RE | Admit: 2017-07-04 | Payer: Managed Care, Other (non HMO) | Source: Ambulatory Visit

## 2017-07-04 ENCOUNTER — Telehealth: Payer: Self-pay | Admitting: Genetic Counselor

## 2017-07-04 ENCOUNTER — Inpatient Hospital Stay: Payer: Managed Care, Other (non HMO)

## 2017-07-04 ENCOUNTER — Other Ambulatory Visit: Payer: Self-pay | Admitting: Family Medicine

## 2017-07-04 DIAGNOSIS — C50812 Malignant neoplasm of overlapping sites of left female breast: Secondary | ICD-10-CM

## 2017-07-04 MED ORDER — ONDANSETRON HCL 8 MG PO TABS: 8.0000 mg | ORAL_TABLET | Freq: Two times a day (BID) | ORAL | 1 refills | Status: DC | PRN

## 2017-07-04 MED ORDER — LIDOCAINE-PRILOCAINE 2.5-2.5 % EX CREA
TOPICAL_CREAM | CUTANEOUS | 3 refills | Status: DC
Start: 2017-07-04 — End: 2017-09-21

## 2017-07-04 MED ORDER — PROCHLORPERAZINE MALEATE 10 MG PO TABS: 10.0000 mg | ORAL_TABLET | Freq: Four times a day (QID) | ORAL | 1 refills | Status: DC | PRN

## 2017-07-04 NOTE — Telephone Encounter (Signed)
Message left for patient to call back about scheduling her Port Placement at [facility].

## 2017-07-04 NOTE — Telephone Encounter (Signed)
Cancer Genetics             Telegenetics Results Disclosure     Patient Name: Carolyn Roy Patient DOB: 08/12/1965 Patient Age: 52 y.o. Encounter Date: 07/04/2017  Referring Provider: Nolon Stalls, MD Honor Loh, NP   Carolyn Roy was called today to discuss genetic test results. Please see the Genetics telephone note from 06/30/2017 for a detailed discussion of her personal and family histories and the recommendations provided.  Genetic Testing: At the time of Carolyn Roy's visit, she chose to pursue genetic testing of multiple genes associated with hereditary susceptibility to cancer. Testing included sequencing and deletion/duplication analysis. 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  A copy of the genetic test report will be scanned into Epic under the media tab.  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 At this time, it is unknown if this finding is associated with increased cancer risk, but majority of these variants get reclassified to be inconsequential. We emphasized that medical management should not be based on this finding. With time, we suspect the lab will determine the significance, if any. If we do learn more about it, we will try to contact Carolyn Roy to discuss it further. It is important to stay in touch with [place] periodically and keep the address and phone number up to date.  The genes on the panel were the 83 genes on [facility]'s Multi-Cancer panel (ALK, APC, ATM, AXIN2, BAP1, BARD1, BLM, BMPR1A, BRCA1, BRCA2, BRIP1, CASR, CDC73, CDH1, CDK4, CDKN1B, CDKN1C, CDKN2A, CEBPA, CHEK2, CTNNA1, DICER1, DIS3L2, EGFR, EPCAM, FH, FLCN, GATA2, GPC3, GREM1, HOXB13, HRAS, KIT, MAX, MEN1, MET, MITF, MLH1, MSH2, MSH3, MSH6, MUTYH, NBN, NF1, NF2, NTHL1, PALB2, PDGFRA, PHOX2B, PMS2, POLD1, POLE, POT1, PRKAR1A, PTCH1, PTEN, RAD50, RAD51C, RAD51D, RB1, RECQL4,  RET, RUNX1, SDHA, SDHAF2, SDHB, SDHC, SDHD, SMAD4, SMARCA4, SMARCB1, SMARCE1, STK11, SUFU, TERC, TERT, TMEM127, TP53, TSC1, TSC2, VHL, WRN, WT1).  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 This is an adult-onset hereditary cancer predisposition syndrome characterized by the development of multiple colonic adenomas, often with progression to colorectal cancer. Having a single MSH3 mutation does not increase cancer risk.  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 Carolyn Roy is recommended to follow cancer screenings recommended by her physicians.  These results suggest that Carolyn Roy's breast cancer was most likely not due to an inherited predisposition. Most cancers happen by chance and this test, along with details of her family history, suggests that her breast cancer falls into this category. We discussed continuing to follow the cancer screening guidelines provided by her physician.   Family Members: It is important that Carolyn Roy informs her relatives of these results so they can decide whether to pursue genetic testing. Carolyn Roy children and siblings each has a 50% chance to have inherited this mutation, but unless they also inherit a second MSH3 mutation from their other parent, this is of no consequence to them. They are recommended to speak with a genetic counselor prior to any testing. A genetic counselor can be located at [email or web address].   Family members should not get tested for the above VUS outside of a research protocol as this finding has no implications for their medical management.  We encouraged  Carolyn Roy to remain in contact with [place] on an annual basis so we can update her personal and family histories, and let her know of advances in cancer genetics that may benefit the family. Our contact number was provided. Carolyn Roy questions were answered to her satisfaction  today, and she knows she is welcome to call anytime with additional questions.    Steele Berg, MS, [place] Certified Genetic Counselor phone: [phone] [email or web address]

## 2017-07-04 NOTE — Telephone Encounter (Signed)
LM for 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 Once discussed with her by phone, a full clinic note will be entered into Epic.  Steele Berg, [place], [place] Genetic Counselor Phone: [phone]

## 2017-07-05 ENCOUNTER — Other Ambulatory Visit: Payer: Self-pay | Admitting: Nurse Practitioner

## 2017-07-05 ENCOUNTER — Inpatient Hospital Stay (HOSPITAL_BASED_OUTPATIENT_CLINIC_OR_DEPARTMENT_OTHER): Payer: Managed Care, Other (non HMO) | Admitting: Obstetrics and Gynecology

## 2017-07-05 ENCOUNTER — Other Ambulatory Visit: Payer: Self-pay

## 2017-07-05 VITALS — BP 112/76 | HR 94 | Temp 97.5°F | Resp 18 | Ht 65.0 in | Wt 187.7 lb

## 2017-07-05 DIAGNOSIS — D390 Neoplasm of uncertain behavior of uterus: Secondary | ICD-10-CM

## 2017-07-05 DIAGNOSIS — N888 Other specified noninflammatory disorders of cervix uteri: Secondary | ICD-10-CM

## 2017-07-05 DIAGNOSIS — N841 Polyp of cervix uteri: Secondary | ICD-10-CM | POA: Insufficient documentation

## 2017-07-05 DIAGNOSIS — C50812 Malignant neoplasm of overlapping sites of left female breast: Secondary | ICD-10-CM

## 2017-07-05 NOTE — Patient Instructions (Signed)
Cervical Biopsy, Care After  Refer to this sheet in the next few weeks. These instructions provide you with information about caring for yourself after your procedure. Your health care provider may also give you more specific instructions. Your treatment has been planned according to current medical practices, but problems sometimes occur. Call your health care provider if you have any problems or questions after your procedure.  What can I expect after the procedure?  After the procedure, it is common to have:  · Cramping or mild pain for a few days.  · Slight bleeding from the vagina for a few days.  · Dark-colored vaginal discharge for a few days.    Follow these instructions at home:  · Take over-the-counter and prescription medicines only as told by your health care provider.  · Return to your normal activities as told by your health care provider. Ask your health care provider what activities are safe for you.  · Use a sanitary napkin until bleeding and discharge stop.  · Do not use tampons until your health care provider approves.  · Do not douche until your health care provider approves.  · Do not have sex until your health care provider approves.  · Keep all follow-up visits as told by your health care provider. This is important.  Contact a health care provider if:  · You have a fever or chills.  · You have bad-smelling vaginal discharge.  · You have itching or irritation around the vagina.  · You have lower abdominal pain.  Get help right away if:  · You develop heavy vaginal bleeding that soaks more than one sanitary pad an hour.  · You faint.  · You have very bad lower abdominal pain.  This information is not intended to replace advice given to you by your health care provider. Make sure you discuss any questions you have with your health care provider.  Document Released: 10/15/2014 Document Revised: 07/02/2015 Document Reviewed: 06/11/2014  Elsevier Interactive Patient Education © 2018 [employer]

## 2017-07-05 NOTE — Progress Notes (Signed)
Gynecologic Oncology Consult Visit   Referring Providers: Dr. Nolon Stalls ([facility] at [facility]) Dr. Hervey Ard  Chief Complaint: Cervical Mass concerning for malignancy  Subjective:  Carolyn Roy is a 52 y.o. female who is seen in consultation from Dr. Mike Gip for cervical mass.    Patient was initially referred to medical oncology for clinical stage IIIa left breast cancer, s/p biopsy. Pathology revealed grade III invasive ductal carcinoma area axillary FNA revealed malignant cells.  Tumor was ER positive, PR positive, HER-2/neu negative.  CA-27-29 was normal.  FSH and estradiol confirmed premenopausal status.  CT C/A/P on 06/29/2017 revealed: an infiltrative heterogeneously enhancing mass in the cervical region extending into the lower uterine segment that was concerning for primary cervical carcinoma.   IMPRESSION: 1. Large mass in the central aspect of the left breast measuring approximately 3.7 x 4.4 x 4.2 cm. There are borderline enlarged left axillary and left internal mammary lymph nodes which are nonspecific but suspicious in the setting of known breast cancer. No other definite signs of metastatic disease elsewhere in the chest, abdomen or pelvis. 2. However, there is what appears to be an infiltrative heterogeneously enhancing mass in the cervical region extending into the lower uterine segment, concerning for concurrent primary cervical carcinoma.  .  Per Dr. Mike Gip, currently they plan for neoadjuvant chemotherapy with standard dose dense AC every 2 weeks for 4 cycles followed by weekly Taxol for 12 weeks.  PET scan has been ordered by Dr. Mike Gip.  Today, patient reports night sweats, headaches, swollen lymph nodes, emesis, and chronic back pain. She does have pain with her menstrual cycle, hot flashes, and pain with intercourse. She has pain with intercourse and post-coital bleeding. She started spotting this morning.  She has not been getting  regular Pap smears/pelvic exams and estimates last Pap smear approximately 2009.  She reports a history of abnormal pap smears and believes that she may have had '3 spots cut out of my cervix 30 years ago'.   Unfortunately she does smoke and has a 5.50-pack-year history.  Genetic Testing- [employer] 83 gene multi cancer panel.  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  Per genetic counselor, carrier of single [facility] 3 mutation does not confer any increased cancer risk. 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   Problem List: Patient Active Problem List   Diagnosis Date Noted  . Cervical mass 07/05/2017  . Family history of breast cancer   . Cancer of midline of breast, left ([place]) 06/15/2017  . Stage 1 mild COPD by GOLD classification ([place]) 03/31/2017  . Hypercholesteremia 03/03/2017  . Tobacco abuse 11/15/2016  . Depression, major, single episode, severe ([place]) 11/15/2016  . Knee pain, left 05/29/2015  . Poorly controlled type 2 diabetes mellitus ([place]) 03/30/2015  . Chronic fatigue 03/30/2015  . Severe depression ([place]) 03/30/2015   Past Medical History: Past Medical History:  Diagnosis Date  . Diabetes mellitus without complication ([place])   . Endometriosis   . Family history of breast cancer   . Hyperlipidemia   . Pneumonia    Past Surgical History: Past Surgical History:  Procedure Laterality Date  . OOPHORECTOMY    . TUBAL LIGATION     Past Gynecologic History:  G3P3 Age of menarche: 64 Duration of periods: 1-2 days Contraception: tubal  OB History:  OB History  Gravida Para Term Preterm AB Living  3 3          SAB TAB Ectopic Multiple Live Births               #  Outcome Date GA Lbr Len/2nd Weight Sex Delivery Anes PTL Lv  3 Para           2 Para           1 Para             Obstetric Comments  1st Menstrual Cycle:  14   1st Pregnancy:  42    Family History: Family History  Problem Relation Age of Onset  . Other Father        No info about father or paternal  relatives  . Diabetes Brother   . Pancreatitis Brother   . Prostate cancer Brother 8       currently 79 / maternal half-brother  . Breast cancer Maternal Grandmother 40       deceased 33s  . Breast cancer Maternal Aunt 65       currently 51  . Breast cancer Other 19       mother's sister; deceased 42  . Breast cancer Other        mother's sister; age at dx unknown    Social History: Social History   Socioeconomic History  . Marital status: Single    Spouse name: Not on file  . Number of children: Not on file  . Years of education: Not on file  . Highest education level: Not on file  Occupational History  . Not on file  Social Needs  . Financial resource strain: Not on file  . Food insecurity:    Worry: Not on file    Inability: Not on file  . Transportation needs:    Medical: Not on file    Non-medical: Not on file  Tobacco Use  . Smoking status: Current Every Day Smoker    Packs/day: 0.50    Years: 11.00    Pack years: 5.50    Types: Cigarettes  . Smokeless tobacco: Former Systems developer    Types: Snuff  Substance and Sexual Activity  . Alcohol use: No    Alcohol/week: 0.0 oz  . 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 Sexual activity: Yes  Lifestyle  . Physical activity:    Days per week: Not on file    Minutes per session: Not on file  . Stress: Not on file  Relationships  . Social connections:    Talks on phone: Not on file    Gets together: Not on file    Attends religious service: Not on file    Active member of club or organization: Not on file    Attends meetings of clubs or organizations: Not on file    Relationship status: Not on file  . Intimate partner violence:    Fear of current or ex partner: Not on file    Emotionally abused: Not on file    Physically abused: Not on file    Forced sexual activity: Not on file  Other Topics Concern  . Not on file  Social History Narrative  . Not on file   Allergies: Allergies   Allergen Reactions  . Aspirin Nausea And Vomiting   Current Medications: Current Outpatient Medications  Medication Sig Dispense Refill  . albuterol (PROVENTIL HFA;VENTOLIN HFA) 108 (90 Base) MCG/ACT inhaler Inhale 2 puffs into the lungs every 6 (six) hours as needed for wheezing or shortness of breath. 1 Inhaler 0  . atorvastatin (LIPITOR) 40 MG tablet Take 1 tablet (40 mg total) by mouth daily. [place]  tablet 3  . blood glucose meter kit and supplies KIT Dispense based on patient and insurance preference. Use up to four times daily as directed. E11.9 1 each 0  . canagliflozin (INVOKANA) 100 MG TABS tablet TAKE 1 TABLET (100 MG TOTAL) BY MOUTH DAILY BEFORE BREAKFAST. 30 tablet 0  . glucose blood (ONE TOUCH ULTRA TEST) test strip Use up to 4 times/day 100 each 12  . Insulin Glargine (BASAGLAR KWIKPEN) 100 UNIT/ML SOPN INJECT 0.24 MLS (24 UNITS TOTAL) INTO THE SKIN AT BEDTIME. 15 pen 2  . Insulin Pen Needle 32G X 4 MM MISC 1 Units by Does not apply route every morning. Pen needles 90 each 3  . lidocaine-prilocaine (EMLA) cream Apply to affected area once 30 g 3  . metFORMIN (GLUCOPHAGE) 500 MG tablet TAKE 2 TABLETS (1,000 MG TOTAL) BY MOUTH 2 (TWO) TIMES DAILY WITH A MEAL. 120 tablet 1  . ondansetron (ZOFRAN) 8 MG tablet Take 1 tablet (8 mg total) by mouth 2 (two) times daily as needed. Start on the third day after chemotherapy. 30 tablet 1  . ONETOUCH DELICA LANCETS 93A MISC USE TO TEST BLOOD SUGAR LEVELS 4 TIMES DAILY 100 each 12  . prochlorperazine (COMPAZINE) 10 MG tablet Take 1 tablet (10 mg total) by mouth every 6 (six) hours as needed (Nausea or vomiting). 30 tablet 1  . Semaglutide (OZEMPIC) 0.25 or 0.5 MG/DOSE SOPN Inject 0.25 mg into the skin once a week. 1 pen 0  . venlafaxine XR (EFFEXOR-XR) 150 MG 24 hr capsule TAKE 1 CAPSULE (150 MG TOTAL) BY MOUTH DAILY WITH BREAKFAST. 90 capsule 1   No current facility-administered medications for this visit.     Review of Systems General:  negative for fevers, chills, fatigue, changes in sleep, changes in weight or appetite Skin: negative for changes in color, texture, moles or lesions Eyes: negative for changes in vision, pain, diplopia HEENT: negative for change in hearing, pain, discharge, tinnitus, vertigo, voice changes, sore throat, neck masses Breasts: positive for left breast cancer and 'lump' in left axilla Pulmonary: negative for dyspnea, orthopnea, productive cough Cardiac: negative for palpitations, syncope, pain, discomfort, pressure Gastrointestinal: negative for dysphagia, nausea, vomiting, jaundice, pain, constipation, diarrhea, hematemesis, hematochezia Genitourinary/Sexual: per HPI Ob/Gyn: per HPI Musculoskeletal: negative for pain, stiffness, swelling, range of motion limitation Hematology: negative for easy bruising, bleeding Neurologic/Psych: negative for headaches, seizures, paralysis, weakness, tremor, change in gait, change in sensation, mood swings, depression, anxiety, change in memory   Objective:  Physical Examination:  BP 112/76 (BP Location: Left Arm, Patient Position: Sitting)   Pulse 94   Temp (!) 97.5 F (36.4 C) (Tympanic)   Resp 18   Ht '5\' 5"'  (1.651 m)   Wt 187 lb 11.2 oz (85.1 kg)   LMP  (LMP Unknown)   SpO2 97%   BMI 31.23 kg/m     ECOG Performance Status: 0 - Asymptomatic  GENERAL: Patient is a well appearing female in no acute distress HEENT:  Sclerae anicteric.  Oropharynx clear and moist. No ulcerations or evidence of oropharyngeal candidiasis. Neck is supple.  NODES:  No cervical, supraclavicular, or axillary lymphadenopathy palpated.  LUNGS:  Clear to auscultation bilaterally.  No wheezes or rhonchi. HEART:  Regular rate and rhythm. No murmur appreciated. ABDOMEN:  Soft, nontender.  Positive, normoactive bowel sounds. No organomegaly palpated. MSK:  No focal spinal tenderness to palpation. Full range of motion bilaterally in the upper extremities. EXTREMITIES:  No  peripheral edema.   SKIN:  Clear with  no obvious rashes or skin changes. No nail dyscrasia. NEURO:  Nonfocal. Well oriented.  Appropriate affect. BREAST: deferred. Known breast cancer. Breast exams with Dr. Mike Gip.   Pelvic: Exam Chaperoned by RN EGBUS: no lesions Cervix: 2 cm endocervical lesion located at the os and ballooning out the cervix, tender, mobile, firm to palpation Vagina: no lesions, no discharge or bleeding Uterus: unable to determine size due to tenderness and habitus, mobile Adnexa: no palpable masses; parametrium on right smooth; on left unable to determine parametrium status due to habitus and tenderness. The left uterosacral ligament is shortened. No pelvic sidewall involvement. Rectovaginal: confirmatory  Lab Review Labs on site today: n/a  Radiologic Imaging: Recommend PET/CT  Procedure: The risks and benefits of the procedure were reviewed and informed consent obtained. Time out was performed. The patient received pre-procedure teaching and expressed understanding. The post-procedure instructions were reviewed with the patient and she expressed understanding. The patient does not have any barriers to learning.  Cervix was cleansed with Hibiclens. Biopsy performed without complication. Hemostasis with Monsel's solution.   Post-procedure evaluation the patient was stable without complaints and tolerated procedure well.     Assessment:  Carolyn Roy is a 52 y.o. female with incidental finding of cervical mass on recent CT scan which was performed for treatment planning for breast cancer. Findings consistent with possible stage IIB cervical cancer.    Medical co-morbidities complicating care: Type 2 Diabetes Mellitus (poorly controlled), Smoker, Breast Cancer, COPD. Body mass index is 31.23 kg/m.  Plan:   Problem List Items Addressed This Visit      Other   Cervical mass - Primary     Follow up biopsy results and PET/CT. Once we have this  information we can determine subsequent management.     Beckey Rutter, DNP, AGNP-C [place] [facility] [phone] (work cell) [phone] (office) 07/05/17 8:53 AM  I personally had a face to face interaction and evaluated the patient jointly with the NP, Ms. Beckey Rutter.  I have reviewed her history and available records and have performed the key portions of the physical exam including general, abdominal exam, extremity, and pelvic exam with my findings confirming those documented above by the APP. I performed the cervical biopsy.  I have discussed the case with the APP and the patient.  I agree with the above documentation, assessment and plan which was fully formulated by me.  Counseling was completed by me.   I personally saw the patient and performed a substantive portion of this encounter in conjunction with the listed APP as documented above.  Ang[place] Gaetana Michaelis, MD  CC:    Referring Providers: Dr. Nolon Stalls (Medical On[facility] at [facility]) Dr. Hervey Ard

## 2017-07-06 ENCOUNTER — Ambulatory Visit: Payer: Managed Care, Other (non HMO) | Admitting: Hematology and Oncology

## 2017-07-06 ENCOUNTER — Other Ambulatory Visit: Payer: Managed Care, Other (non HMO)

## 2017-07-06 ENCOUNTER — Ambulatory Visit: Payer: Managed Care, Other (non HMO)

## 2017-07-06 NOTE — Telephone Encounter (Signed)
Invokana refill Last Refill:05/18/17 #30 tab no RF Last OV: 06/02/17 PCP: Kathrine Haddock NP Pharmacy:[facility] [address] Last Hgb A1C: 06/02/17

## 2017-07-07 ENCOUNTER — Inpatient Hospital Stay (HOSPITAL_BASED_OUTPATIENT_CLINIC_OR_DEPARTMENT_OTHER): Payer: Managed Care, Other (non HMO) | Admitting: Hematology and Oncology

## 2017-07-07 ENCOUNTER — Encounter: Payer: Self-pay | Admitting: *Deleted

## 2017-07-07 ENCOUNTER — Encounter: Payer: Self-pay | Admitting: Hematology and Oncology

## 2017-07-07 VITALS — BP 113/77 | HR 92 | Temp 98.4°F | Resp 18 | Ht 65.0 in | Wt 187.5 lb

## 2017-07-07 DIAGNOSIS — C773 Secondary and unspecified malignant neoplasm of axilla and upper limb lymph nodes: Secondary | ICD-10-CM | POA: Diagnosis not present

## 2017-07-07 DIAGNOSIS — Z170 Estrogen receptor positive status [ER+]: Secondary | ICD-10-CM

## 2017-07-07 DIAGNOSIS — R221 Localized swelling, mass and lump, neck: Secondary | ICD-10-CM

## 2017-07-07 DIAGNOSIS — C50912 Malignant neoplasm of unspecified site of left female breast: Secondary | ICD-10-CM | POA: Diagnosis not present

## 2017-07-07 DIAGNOSIS — N888 Other specified noninflammatory disorders of cervix uteri: Secondary | ICD-10-CM

## 2017-07-07 DIAGNOSIS — C50812 Malignant neoplasm of overlapping sites of left female breast: Secondary | ICD-10-CM

## 2017-07-07 NOTE — Progress Notes (Signed)
Patient c/o having trouble sleeping at night and would like something to help her sleep.

## 2017-07-07 NOTE — Progress Notes (Signed)
[facility] day:  07/08/2017  Chief Complaint: Carolyn Roy is a 52 y.o. female with clinical T3N1Mx left breast cancer and clinical IIB cervical cancer who is review of interval studies and discussion regarding direction of therapy.  HPI:  The patient was last seen in the medical oncology clinic on 06/29/2017.  At that time,  she felt well.  Exam revealed a 5 cm central left breast mass.  Chest, abdomen, and pelvic CT revealed the breast mass, left axillary and internal mammary adenopathy, and unexpected findings of a cervical mass.  PET scan was ordered.  She was referred to gynecology-oncology.  She was seen by Beckey Rutter, NP on 07/05/2017.  Last PAP smear was 2009.  She described painful intercourse and post-coital bleeding.  Cervix exam revealed a 2 cm endocervical lesion located at the os and ballooning out of the cervix.  Biopsy was obtained.  She was felt to have a clinical stage IIB cervical cancer.  She was referred to genetics.  She spoke to Steele Berg, Retail buyer, on 06/30/2017 and 07/04/2017.  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 This is an adult-onset hereditary cancer predisposition syndrome characterized by the development of multiple colonic adenomas, often with progression to colorectal cancer. Having a single MSH3 mutation does not increase cancer risk.  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 At this time, it is unknown if this finding is associated with increased cancer risk  PET scan is scheduled for 07/11/2017.  During the interim, she denies any new complaints.  She is less fatigued.  She denies any RUE symptoms.  She denies any vaginal bleeding.  She stopped taking insulin and Invokana.  She cut back on her smoking.   Past  Medical History:  Diagnosis Date  . Diabetes mellitus without complication ([place])   . Endometriosis   . Family history of breast cancer   . Hyperlipidemia   . Pneumonia     Past Surgical History:  Procedure Laterality Date  . OOPHORECTOMY    . TUBAL LIGATION      Family History  Problem Relation Age of Onset  . Other Father        No info about father or paternal relatives  . Diabetes Brother   . Pancreatitis Brother   . Prostate cancer Brother 57       currently 58 / maternal half-brother  . Breast cancer Maternal Grandmother 40       deceased 33s  . Breast cancer Maternal Aunt 65       currently 20  . Breast cancer Other 6       mother's sister; deceased 79  . Breast cancer Other        mother's sister; age at dx unknown    Social History:  reports that she has been smoking cigarettes.  She has a 5.50 pack-year smoking history. She has quit using smokeless tobacco. Her smokeless tobacco use included snuff. She reports that she does not drink alcohol or use drugs.  She smokes 0.5-1 ppd x 11 years. She works at [employer] in [place].  Patient denies known exposures to radiation on toxins. Her fiance is Judee Clara.  The patient is accompanied by the nurse navigators Tanya Nones) and later by her fiance today.  Allergies:  Allergies  Allergen Reactions  .  Aspirin Nausea And Vomiting    Current Medications: Current Outpatient Medications  Medication Sig Dispense Refill  . atorvastatin (LIPITOR) 40 MG tablet Take 1 tablet (40 mg total) by mouth daily. 90 tablet 3  . glucose blood (ONE TOUCH ULTRA TEST) test strip Use up to 4 times/day 100 each 12  . Insulin Glargine (BASAGLAR KWIKPEN) 100 UNIT/ML SOPN INJECT 0.24 MLS (24 UNITS TOTAL) INTO THE SKIN AT BEDTIME. 15 pen 2  . Insulin Pen Needle 32G X 4 MM MISC 1 Units by Does not apply route every morning. Pen needles 90 each 3  . INVOKANA 100 MG TABS tablet TAKE 1 TABLET (100 MG TOTAL) BY MOUTH DAILY  BEFORE BREAKFAST. 30 tablet 0  . metFORMIN (GLUCOPHAGE) 500 MG tablet TAKE 2 TABLETS (1,000 MG TOTAL) BY MOUTH 2 (TWO) TIMES DAILY WITH A MEAL. 120 tablet 1  . ONETOUCH DELICA LANCETS 45G MISC USE TO TEST BLOOD SUGAR LEVELS 4 TIMES DAILY 100 each 12  . Semaglutide (OZEMPIC) 0.25 or 0.5 MG/DOSE SOPN Inject 0.25 mg into the skin once a week. 1 pen 0  . venlafaxine XR (EFFEXOR-XR) 150 MG 24 hr capsule TAKE 1 CAPSULE (150 MG TOTAL) BY MOUTH DAILY WITH BREAKFAST. 90 capsule 1  . albuterol (PROVENTIL HFA;VENTOLIN HFA) 108 (90 Base) MCG/ACT inhaler Inhale 2 puffs into the lungs every 6 (six) hours as needed for wheezing or shortness of breath. (Patient not taking: Reported on 07/05/2017) 1 Inhaler 0  . blood glucose meter kit and supplies KIT Dispense based on patient and insurance preference. Use up to four times daily as directed. E11.9 (Patient not taking: Reported on 07/05/2017) 1 each 0  . lidocaine-prilocaine (EMLA) cream Apply to affected area once (Patient not taking: Reported on 07/05/2017) 30 g 3  . ondansetron (ZOFRAN) 8 MG tablet Take 1 tablet (8 mg total) by mouth 2 (two) times daily as needed. Start on the third day after chemotherapy. (Patient not taking: Reported on 07/05/2017) 30 tablet 1  . prochlorperazine (COMPAZINE) 10 MG tablet Take 1 tablet (10 mg total) by mouth every 6 (six) hours as needed (Nausea or vomiting). (Patient not taking: Reported on 07/07/2017) 30 tablet 1   No current facility-administered medications for this visit.     Review of Systems  Constitutional: Positive for malaise/fatigue (improved). Negative for diaphoresis, fever and weight loss.  HENT: Negative.  Negative for congestion, hearing loss, nosebleeds, sinus pain and sore throat.   Eyes: Negative.  Negative for blurred vision, double vision, photophobia and redness.  Respiratory: Positive for cough (related to smoking) and shortness of breath (exertional). Negative for hemoptysis and sputum production.    Cardiovascular: Negative.  Negative for chest pain, palpitations, orthopnea, leg swelling and PND.  Gastrointestinal: Positive for constipation. Negative for abdominal pain, blood in stool, diarrhea, melena, nausea and vomiting.  Genitourinary: Negative.  Negative for dysuria, frequency, hematuria and urgency.  Musculoskeletal: Positive for joint pain (knee). Negative for back pain, falls and myalgias.  Skin: Negative.  Negative for itching and rash.       Skin dimpling - LEFT breast. Face and upper body is red from patient going to the tanning bed.   Neurological: Positive for headaches (h/o migraines). Negative for dizziness, tremors and weakness.  Endo/Heme/Allergies: Does not bruise/bleed easily.       T2DM (stopped insulin and Invokana); denies neuropathy  Psychiatric/Behavioral: Negative for depression, memory loss and suicidal ideas. The patient has insomnia. The patient is not nervous/anxious.   All other systems reviewed and  are negative.  Performance status (ECOG): 1 - Symptomatic but completely ambulatory  Physical Exam: Blood pressure 113/77, pulse 92, temperature 98.4 F (36.9 C), resp. rate 18, height _0  (1.651 m), weight 187 lb 8 oz (85 kg), SpO2 98 %. GENERAL:  Well developed, well nourished, woman sitting comfortably in the exam room in no acute distress. MENTAL STATUS:  Alert and oriented to person, place and time. HEAD:  Long auburn hair.  Normocephalic, atraumatic, face symmetric, no Cushingoid features. EYES:  Glasses.  Hazel eyes.  No conjunctivitis or scleral icterus. SKIN:  Tan.  No rashes, ulcers or lesions. EXTREMITIES: No edema, no skin discoloration or tenderness.  No palpable cords. NEUROLOGICAL: Unremarkable. PSYCH:  Appropriate.    No visits with results within 3 Day(s) from this visit.  Latest known visit with results is:  Appointment on 06/20/2017  Component Date Value Ref Range Status  . Sodium 06/20/2017 136  135 - 145 mmol/L Final  . Potassium  06/20/2017 4.0  3.5 - 5.1 mmol/L Final  . Chloride 06/20/2017 102  101 - 111 mmol/L Final  . CO2 06/20/2017 25  22 - 32 mmol/L Final  . Glucose, Bld 06/20/2017 198* 65 - 99 mg/dL Final  . BUN 06/20/2017 10  6 - 20 mg/dL Final  . Creatinine, Ser 06/20/2017 0.49  0.44 - 1.00 mg/dL Final  . Calcium 06/20/2017 9.3  8.9 - 10.3 mg/dL Final  . Total Protein 06/20/2017 7.5  6.5 - 8.1 g/dL Final  . Albumin 06/20/2017 3.9  3.5 - 5.0 g/dL Final  . AST 06/20/2017 14* 15 - 41 U/L Final  . ALT 06/20/2017 13* 14 - 54 U/L Final  . Alkaline Phosphatase 06/20/2017 90  38 - 126 U/L Final  . Total Bilirubin 06/20/2017 0.7  0.3 - 1.2 mg/dL Final  . GFR calc non Af Amer 06/20/2017 >60  >60 mL/min Final  . GFR calc Af Amer 06/20/2017 >60  >60 mL/min Final   Comment: (NOTE) The eGFR has been calculated using the CKD EPI equation. This calculation has not been validated in all clinical situations. eGFR's persistently <60 mL/min signify possible Chronic Kidney Disease.   Georgiann Hahn gap 06/20/2017 9  5 - 15 Final   Performed at [facility], [address] [place], [place] [address]  . [facility] 06/20/2017 3.0  mIU/mL Final   Comment: (NOTE)                    Adult Female:                      Follicular phase      3.5 -  12.5                      Ovulation phase       4.7 -  21.5                      Luteal phase          1.7 -   7.7                      Postmenopausal       25.8 - 134.8 Performed At: [facility] [place], [place] [id number] Rush Farmer MD [id number] Performed at [facility], [address] [place], [place] [address]   . Estradiol 06/20/2017 421.4  pg/mL Final   Comment: (NOTE)  Adult Female:                      Follicular phase   48.8 -   166.0                      Ovulation phase    85.8 -   498.0                      Luteal phase       43.8 -   211.0                      Postmenopausal     <6.0 -    54.7                     Pregnancy                      1st trimester     215.0 - >4300.0                    Girls (1-10 years)    6.0 -    27.0 Roche ECLIA methodology Performed At: [facility] [place], [place] [id number] Rush Farmer MD [id number] Performed at [facility], [address] [place], [place] [address]     Assessment:  ANGELYSE HESLIN is a 51 y.o. female with clinical stage IIIA (T3N1Mx) left breast cancer s/p biopsy on 06/14/2017.  Pathology revealed grade III invasive ductal carcinoma.  Axillary FNA revealed malignant cells c/w metastatic carcinoma. Tumor was ER + (90%), PR + (30%), Her2/neu - and Ki67 70%.  CA27.29 was 7.8 on 06/14/2017.  Bilateral mammogram on 06/12/2017 revealed a 3.5 cm irregular mass with associated architectural distortion within the retroareolar left breast centered at the 3-4 o'clock axis.  There was probable surrounding satellite masses extending to overall measurement of 5.5 cm.  There were no masses in the right breast.  Utrasound on 06/14/2017 revealed a 2.4 x 4.0 x 5.1 cm hypoechoic mass in the retroareolar area.  The mass extended to the pectoralis fascia.  There was a prominent lymph node measuring 1.06 x 1.32 x 1.33 cm with cortical thickening.   Echo on 06/28/2017 revealed an EF of 60-65%  Chest, abdomen, and pelvis CT on 06/29/2017 revealed a LEFT breast mass measuring approximately 3.7 x 4.4 x 4.2 cm. There were borderline enlarged LEFT axillary and left internal mammary lymph nodes.  There was an infiltrative heterogeneously enhancing mass in the cervical region extending into the lower uterine segment that was concerning for a primary cervical carcinoma.  She has clinical stage IIB cervical cancer.  Exam reveals a 2 cm endocervical lesion located at the os and ballooning out of the cervix.  Biopsy is pending.  She has a family history of breast cancer.  She is premenopausal.  [employer]ae genetic testing revealed a single mutation  in the [facility] gene called c.1148dup (p.Asn385Glnfs*19). 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   Symptomatically, she is less fatigued.  Exam reveals a 5 cm central left breast mass and a 2 cm endocervical lesion.  Plan: 1. Discuss interval gynecology evaluation.  Per notes, suspected IIB disease (parametrial involvement).  Await pathology and PET scan.  Discuss standard treatment with concurent chemotherapy (ciplatin) + radiation followed by brachytherapy. 2. Discuss planned treatment for breast cancer (  AC x 4 followed by Taxol).  Discuss issues with treating both malignancies.  Discuss presentation at tumor board and consultation with [facility].  3. Discuss planned PET scan on 07/11/2017. 4. Discuss interval genetic testing.  Single mutation MSH3 and VUS (RECQL4). 5. Present at tumor board next week. 6. Discuss planned port-a-cath placement with Dr. Bary Castilla.  7. RTC on 07/07/2017 for MD assessment and further discussions regarding treatment (cancel chemotherapy for next week).    Lequita Asal, MD  07/07/2017, 4:35 PM

## 2017-07-07 NOTE — Progress Notes (Signed)
  Oncology Nurse Navigator Documentation  Navigator Location: [facility] (07/07/17 1400) Referral date to RadOnc/MedOnc: 07/07/17 (07/07/17 1400) )Navigator Encounter Type: Clinic/MDC (07/07/17 1400)                                      Support Groups/Services: Other (07/07/17 1400) Specialty Items/DME: ([facility]) (07/07/17 1400)           Time Spent with Patient: 30 (07/07/17 1400)   Met patient and her significant other for a follow-up visit with Dr. Mike Gip.  Patient with probable cervical cancer also.  Final path is pending.  Patient's case is to be discussed at case conference.  She is to return next week after her PET scan for final treatment planning.  Patient states she cannot sleep.  Offered healing touch.  She is agreeable.  Left message with pastoral care to contact patient.

## 2017-07-08 ENCOUNTER — Encounter: Payer: Self-pay | Admitting: Urgent Care

## 2017-07-10 ENCOUNTER — Other Ambulatory Visit: Payer: Self-pay | Admitting: Nurse Practitioner

## 2017-07-10 ENCOUNTER — Other Ambulatory Visit: Payer: Self-pay | Admitting: General Surgery

## 2017-07-10 ENCOUNTER — Telehealth: Payer: Self-pay | Admitting: *Deleted

## 2017-07-10 DIAGNOSIS — N888 Other specified noninflammatory disorders of cervix uteri: Secondary | ICD-10-CM

## 2017-07-10 DIAGNOSIS — C50812 Malignant neoplasm of overlapping sites of left female breast: Secondary | ICD-10-CM

## 2017-07-10 NOTE — Telephone Encounter (Signed)
Patient called the office back today and right power port placement has been scheduled for Friday, 07-14-17 at [facility].   The patient is aware to call the office should she have further questions. She verbalizes understanding.

## 2017-07-10 NOTE — Telephone Encounter (Signed)
Another message left for patient to call the office to schedule port placement since we have not heard back from her since Caryl-Lyn called and left a message last week.

## 2017-07-11 ENCOUNTER — Ambulatory Visit: Payer: Managed Care, Other (non HMO)

## 2017-07-11 ENCOUNTER — Other Ambulatory Visit: Payer: Self-pay | Admitting: General Surgery

## 2017-07-11 ENCOUNTER — Ambulatory Visit
Admission: RE | Admit: 2017-07-11 | Discharge: 2017-07-11 | Disposition: A | Discharge status: Home / Self Care | Payer: Managed Care, Other (non HMO) | Source: Ambulatory Visit | Attending: Nurse Practitioner | Admitting: Nurse Practitioner

## 2017-07-11 ENCOUNTER — Telehealth: Payer: Self-pay | Admitting: *Deleted

## 2017-07-11 ENCOUNTER — Telehealth: Payer: Self-pay | Admitting: General Surgery

## 2017-07-11 DIAGNOSIS — N888 Other specified noninflammatory disorders of cervix uteri: Secondary | ICD-10-CM | POA: Diagnosis present

## 2017-07-11 LAB — GLUCOSE, CAPILLARY: Glucose-Capillary: 301 mg/dL — ABNORMAL HIGH (ref 65–99)

## 2017-07-11 NOTE — Telephone Encounter (Signed)
PET unable to do her scan today because her Glu is 301, patient admits she does not take her diabetes medicine because it makes her sick. She is rescheduled for Monday. Patient has a follow up appointment with Dr Mike Gip Thursday for post case conference visit, does this need to be rescheduled?

## 2017-07-11 NOTE — Progress Notes (Signed)
Patient is scheduled for a right PowerPort placement on Friday, July 14, 2017.  At the same time will be placing a biopsy clip in the left axillary lymph node for identification after completion of neoadjuvant chemotherapy.

## 2017-07-11 NOTE — Telephone Encounter (Signed)
As the patient was identified with a positive left axillary lymph node on FNA, I have recommended placement of a gel Mark clip in that node for identification after she completes neoadjuvant chemotherapy.  This will be done at the time of right PowerPort placement.  The patient is amenable to have the marking clip placed.

## 2017-07-11 NOTE — Telephone Encounter (Signed)
Yes... reschedule appointments until AFTER the PET scan.

## 2017-07-13 ENCOUNTER — Other Ambulatory Visit: Payer: Self-pay

## 2017-07-13 ENCOUNTER — Telehealth: Payer: Self-pay

## 2017-07-13 ENCOUNTER — Encounter: Payer: Self-pay | Admitting: *Deleted

## 2017-07-13 ENCOUNTER — Encounter
Admission: RE | Admit: 2017-07-13 | Discharge: 2017-07-13 | Disposition: A | Discharge status: Home / Self Care | Payer: Managed Care, Other (non HMO) | Source: Ambulatory Visit | Attending: General Surgery | Admitting: General Surgery

## 2017-07-13 ENCOUNTER — Ambulatory Visit: Payer: Managed Care, Other (non HMO) | Admitting: Hematology and Oncology

## 2017-07-13 DIAGNOSIS — Z886 Allergy status to analgesic agent status: Secondary | ICD-10-CM | POA: Diagnosis not present

## 2017-07-13 DIAGNOSIS — C773 Secondary and unspecified malignant neoplasm of axilla and upper limb lymph nodes: Secondary | ICD-10-CM | POA: Diagnosis not present

## 2017-07-13 DIAGNOSIS — F17210 Nicotine dependence, cigarettes, uncomplicated: Secondary | ICD-10-CM | POA: Diagnosis not present

## 2017-07-13 DIAGNOSIS — Z803 Family history of malignant neoplasm of breast: Secondary | ICD-10-CM | POA: Diagnosis not present

## 2017-07-13 DIAGNOSIS — I10 Essential (primary) hypertension: Secondary | ICD-10-CM

## 2017-07-13 DIAGNOSIS — Z79899 Other long term (current) drug therapy: Secondary | ICD-10-CM | POA: Diagnosis not present

## 2017-07-13 DIAGNOSIS — Z01810 Encounter for preprocedural cardiovascular examination: Secondary | ICD-10-CM

## 2017-07-13 DIAGNOSIS — C50512 Malignant neoplasm of lower-outer quadrant of left female breast: Secondary | ICD-10-CM | POA: Diagnosis present

## 2017-07-13 DIAGNOSIS — Z794 Long term (current) use of insulin: Secondary | ICD-10-CM | POA: Diagnosis not present

## 2017-07-13 DIAGNOSIS — E119 Type 2 diabetes mellitus without complications: Secondary | ICD-10-CM | POA: Diagnosis not present

## 2017-07-13 DIAGNOSIS — E785 Hyperlipidemia, unspecified: Secondary | ICD-10-CM | POA: Diagnosis not present

## 2017-07-13 LAB — PAP LB AND HPV HIGH-RISK
HPV, high-risk: NEGATIVE
PAP Smear Comment: 0

## 2017-07-13 LAB — SURGICAL PATHOLOGY

## 2017-07-13 MED ORDER — CEFAZOLIN SODIUM-DEXTROSE 2-4 GM/100ML-% IV SOLN
2.0000 g | INTRAVENOUS | Status: AC
  Administered 2017-07-14: 2 g via INTRAVENOUS

## 2017-07-13 NOTE — Patient Instructions (Signed)
Your procedure is scheduled on: 07/14/17 Report to Day Surgery. MEDICAL MALL SECOND FLOOR To find out your arrival time please call [phone] between 1PM - 3PM on   07/13/17  Remember: Instructions that are not followed completely may result in serious medical risk, up to and including death, or upon the discretion of your surgeon and anesthesiologist your surgery may need to be rescheduled.     _X__ 1. Do not eat food after midnight the night before your procedure.                 No gum chewing or hard candies. You may drink clear liquids up to 2 hours                 before you are scheduled to arrive for your surgery- DO not drink clear                 liquids within 2 hours of the start of your surgery.                 Clear Liquids include:  water, apple juice without pulp, clear carbohydrate                 drink such as Clearfast of Gartorade, Black Coffee or Tea (Do not add                 anything to coffee or tea).  __X__2.  On the morning of surgery brush your teeth with toothpaste and water, you                 may rinse your mouth with mouthwash if you wish.  Do not swallow any              toothpaste of mouthwash.     _X__ 3.  No Alcohol for 24 hours before or after surgery.   _X__ 4.  Do Not Smoke or use e-cigarettes For 24 Hours Prior to Your Surgery.                 Do not use any chewable tobacco products for at least 6 hours prior to                 surgery.  ____  5.  Bring all medications with you on the day of surgery if instructed.   _X___  6.  Notify your doctor if there is any change in your medical condition      (cold, fever, infections).     Do not wear jewelry, make-up, hairpins, clips or nail polish. Do not wear lotions, powders, or perfumes. You may wear deodorant. Do not shave 48 hours prior to surgery. Men may shave face and neck. Do not bring valuables to the hospital.    [facility] is not responsible for any belongings or  valuables.  Contacts, dentures or bridgework may not be worn into surgery. Leave your suitcase in the car. After surgery it may be brought to your room. For patients admitted to the hospital, discharge time is determined by your treatment team.   Patients discharged the day of surgery will not be allowed to drive home.   Please read over the following fact sheets that you were given:   Surgical Site Infection Prevention   __X__ Take these medicines the morning of surgery with A SIP OF WATER:    1.NONE  2.   3.   4.  5.  6.  ____  Fleet Enema (as directed)   _X___ Use CHG Soap as directed  _X___ Use inhalers on the day of surgery   AND BRING  _X___ Stop metformin 2 days prior to surgery    __X__ Take 1/2 of usual insulin dose the night before surgery. No insulin the morning          of surgery.   ____ Stop Coumadin/Plavix/aspirin on   ____ Stop Anti-inflammatories on   ____ Stop supplements until after surgery.    ____ Bring C-Pap to the hospital.

## 2017-07-13 NOTE — Telephone Encounter (Signed)
Voicemail left with Ms. White to return call for pathology results and plan per Dr. Theora Gianotti. Oncology Nurse Navigator Documentation  Navigator Location: [facility] (07/13/17 1600)   )Navigator Encounter Type: Telephone;Diagnostic Results (07/13/17 1600) Telephone: Outgoing Call;Diagnostic Results (07/13/17 1600)                                                  Time Spent with Patient: 15 (07/13/17 1600)

## 2017-07-13 NOTE — Telephone Encounter (Signed)
[name] returned call. Reviewed pathology from cervical biopsy. Per Dr. Theora Gianotti we will see what the PET shows. If PET lights up in the cervix then we will plan for radiologic guided biopsy of the cervical mass. All questions answered to her satisfaction. PET scan is scheduled for 6/10.  Oncology Nurse Navigator Documentation  Navigator Location: [facility] (07/13/17 1700)   )Navigator Encounter Type: Telephone;Diagnostic Results (07/13/17 1700) Telephone: Incoming Call;Diagnostic Results (07/13/17 1700)                                                  Time Spent with Patient: 15 (07/13/17 1700)

## 2017-07-14 ENCOUNTER — Ambulatory Visit: Payer: Managed Care, Other (non HMO) | Admitting: Anesthesiology

## 2017-07-14 ENCOUNTER — Encounter: Payer: Self-pay | Admitting: *Deleted

## 2017-07-14 ENCOUNTER — Ambulatory Visit: Payer: Managed Care, Other (non HMO)

## 2017-07-14 ENCOUNTER — Encounter
Admission: RE | Disposition: A | Discharge status: Home / Self Care | Payer: Self-pay | Source: Ambulatory Visit | Attending: General Surgery

## 2017-07-14 ENCOUNTER — Other Ambulatory Visit: Payer: Self-pay

## 2017-07-14 ENCOUNTER — Ambulatory Visit
Admission: RE | Admit: 2017-07-14 | Discharge: 2017-07-14 | Disposition: A | Discharge status: Home / Self Care | Payer: Managed Care, Other (non HMO) | Source: Ambulatory Visit | Attending: General Surgery | Admitting: General Surgery

## 2017-07-14 DIAGNOSIS — E119 Type 2 diabetes mellitus without complications: Secondary | ICD-10-CM | POA: Insufficient documentation

## 2017-07-14 DIAGNOSIS — Z79899 Other long term (current) drug therapy: Secondary | ICD-10-CM | POA: Insufficient documentation

## 2017-07-14 DIAGNOSIS — Z803 Family history of malignant neoplasm of breast: Secondary | ICD-10-CM | POA: Insufficient documentation

## 2017-07-14 DIAGNOSIS — I10 Essential (primary) hypertension: Secondary | ICD-10-CM | POA: Insufficient documentation

## 2017-07-14 DIAGNOSIS — Z95828 Presence of other vascular implants and grafts: Secondary | ICD-10-CM

## 2017-07-14 DIAGNOSIS — C50312 Malignant neoplasm of lower-inner quadrant of left female breast: Secondary | ICD-10-CM

## 2017-07-14 DIAGNOSIS — Z794 Long term (current) use of insulin: Secondary | ICD-10-CM | POA: Insufficient documentation

## 2017-07-14 DIAGNOSIS — F17210 Nicotine dependence, cigarettes, uncomplicated: Secondary | ICD-10-CM | POA: Insufficient documentation

## 2017-07-14 DIAGNOSIS — E785 Hyperlipidemia, unspecified: Secondary | ICD-10-CM | POA: Insufficient documentation

## 2017-07-14 DIAGNOSIS — Z886 Allergy status to analgesic agent status: Secondary | ICD-10-CM | POA: Insufficient documentation

## 2017-07-14 DIAGNOSIS — C50512 Malignant neoplasm of lower-outer quadrant of left female breast: Secondary | ICD-10-CM | POA: Diagnosis not present

## 2017-07-14 DIAGNOSIS — C773 Secondary and unspecified malignant neoplasm of axilla and upper limb lymph nodes: Secondary | ICD-10-CM | POA: Insufficient documentation

## 2017-07-14 DIAGNOSIS — C50812 Malignant neoplasm of overlapping sites of left female breast: Secondary | ICD-10-CM

## 2017-07-14 HISTORY — PX: PORTACATH PLACEMENT: SHX2246

## 2017-07-14 HISTORY — PX: AXILLARY LYMPH NODE BIOPSY: SHX5737

## 2017-07-14 HISTORY — DX: Malignant (primary) neoplasm, unspecified: C80.1

## 2017-07-14 HISTORY — DX: Depression, unspecified: F32.A

## 2017-07-14 HISTORY — DX: Major depressive disorder, single episode, unspecified: F32.9

## 2017-07-14 LAB — POCT PREGNANCY, URINE: Preg Test, Ur: NEGATIVE

## 2017-07-14 LAB — GLUCOSE, CAPILLARY
Glucose-Capillary: 149 mg/dL — ABNORMAL HIGH (ref 65–99)
Glucose-Capillary: 100 mg/dL — ABNORMAL HIGH (ref 65–99)

## 2017-07-14 SURGERY — INSERTION, TUNNELED CENTRAL VENOUS DEVICE, WITH PORT
Anesthesia: General | Laterality: Right | Wound class: Clean

## 2017-07-14 MED ORDER — ACETAMINOPHEN 10 MG/ML IV SOLN
INTRAVENOUS | Status: AC
  Filled 2017-07-14: qty 100

## 2017-07-14 MED ORDER — DEXAMETHASONE SODIUM PHOSPHATE 10 MG/ML IJ SOLN
INTRAMUSCULAR | Status: DC | PRN
  Administered 2017-07-14: 5 mg via INTRAVENOUS

## 2017-07-14 MED ORDER — MIDAZOLAM HCL 2 MG/2ML IJ SOLN
INTRAMUSCULAR | Status: DC | PRN
  Administered 2017-07-14: 2 mg via INTRAVENOUS

## 2017-07-14 MED ORDER — CEFAZOLIN SODIUM-DEXTROSE 2-4 GM/100ML-% IV SOLN
INTRAVENOUS | Status: AC
  Filled 2017-07-14: qty 100

## 2017-07-14 MED ORDER — LIDOCAINE 1 % OPTIME INJ - NO CHARGE
INTRAMUSCULAR | Status: DC | PRN
  Administered 2017-07-14: 10 mL

## 2017-07-14 MED ORDER — FAMOTIDINE 20 MG PO TABS
ORAL_TABLET | ORAL | Status: AC
  Filled 2017-07-14: qty 1

## 2017-07-14 MED ORDER — PHENYLEPHRINE HCL 10 MG/ML IJ SOLN
INTRAMUSCULAR | Status: DC | PRN
  Administered 2017-07-14 (×4): 100 ug via INTRAVENOUS

## 2017-07-14 MED ORDER — BUPIVACAINE-EPINEPHRINE (PF) 0.5% -1:200000 IJ SOLN
INTRAMUSCULAR | Status: AC
  Filled 2017-07-14: qty 30

## 2017-07-14 MED ORDER — FENTANYL CITRATE (PF) 100 MCG/2ML IJ SOLN
INTRAMUSCULAR | Status: DC | PRN
  Administered 2017-07-14: 25 ug via INTRAVENOUS

## 2017-07-14 MED ORDER — ACETAMINOPHEN 10 MG/ML IV SOLN
INTRAVENOUS | Status: DC | PRN
  Administered 2017-07-14: 1000 mg via INTRAVENOUS

## 2017-07-14 MED ORDER — PROPOFOL 10 MG/ML IV BOLUS
INTRAVENOUS | Status: AC
  Filled 2017-07-14: qty 40

## 2017-07-14 MED ORDER — DEXAMETHASONE SODIUM PHOSPHATE 10 MG/ML IJ SOLN
INTRAMUSCULAR | Status: AC
  Filled 2017-07-14: qty 1

## 2017-07-14 MED ORDER — LIDOCAINE HCL (CARDIAC) PF 100 MG/5ML IV SOSY
PREFILLED_SYRINGE | INTRAVENOUS | Status: DC | PRN
  Administered 2017-07-14: 100 mg via INTRAVENOUS

## 2017-07-14 MED ORDER — PROPOFOL 10 MG/ML IV BOLUS
INTRAVENOUS | Status: DC | PRN
  Administered 2017-07-14: 50 mg via INTRAVENOUS
  Administered 2017-07-14: 150 mg via INTRAVENOUS

## 2017-07-14 MED ORDER — ONDANSETRON HCL 4 MG/2ML IJ SOLN
INTRAMUSCULAR | Status: DC | PRN
  Administered 2017-07-14: 4 mg via INTRAVENOUS

## 2017-07-14 MED ORDER — SODIUM CHLORIDE 0.9 % IV SOLN
INTRAVENOUS | Status: DC
  Administered 2017-07-14: 10:00:00 via INTRAVENOUS

## 2017-07-14 MED ORDER — SODIUM CHLORIDE FLUSH 0.9 % IV SOLN
INTRAVENOUS | Status: AC
  Filled 2017-07-14: qty 40

## 2017-07-14 MED ORDER — ONDANSETRON HCL 4 MG/2ML IJ SOLN: 4.0000 mg | Freq: Once | INTRAMUSCULAR | Status: DC | PRN

## 2017-07-14 MED ORDER — LIDOCAINE HCL (PF) 1 % IJ SOLN
INTRAMUSCULAR | Status: AC
  Filled 2017-07-14: qty 30

## 2017-07-14 MED ORDER — ONDANSETRON HCL 4 MG/2ML IJ SOLN
INTRAMUSCULAR | Status: AC
  Filled 2017-07-14: qty 2

## 2017-07-14 MED ORDER — FENTANYL CITRATE (PF) 100 MCG/2ML IJ SOLN
INTRAMUSCULAR | Status: AC
  Filled 2017-07-14: qty 2

## 2017-07-14 MED ORDER — LIDOCAINE HCL (PF) 2 % IJ SOLN
INTRAMUSCULAR | Status: AC
  Filled 2017-07-14: qty 10

## 2017-07-14 MED ORDER — FAMOTIDINE 20 MG PO TABS
20.0000 mg | ORAL_TABLET | Freq: Once | ORAL | Status: AC
  Administered 2017-07-14: 20 mg via ORAL

## 2017-07-14 MED ORDER — MIDAZOLAM HCL 2 MG/2ML IJ SOLN
INTRAMUSCULAR | Status: AC
  Filled 2017-07-14: qty 2

## 2017-07-14 MED ORDER — FENTANYL CITRATE (PF) 100 MCG/2ML IJ SOLN: 25.0000 ug | INTRAMUSCULAR | Status: DC | PRN

## 2017-07-14 MED ORDER — LACTATED RINGERS IV SOLN
INTRAVENOUS | Status: DC | PRN
  Administered 2017-07-14: 12:00:00 via INTRAVENOUS

## 2017-07-14 SURGICAL SUPPLY — 39 items
APPLIER CLIP 11 MED OPEN (CLIP)
BLADE SURG 15 STRL SS SAFETY (BLADE) ×3 IMPLANT
CANISTER SUCT 1200ML W/VALVE (MISCELLANEOUS) ×3 IMPLANT
CHLORAPREP W/TINT 26ML (MISCELLANEOUS) ×3 IMPLANT
CLIP APPLIE 11 MED OPEN (CLIP) IMPLANT
COVER LIGHT HANDLE STERIS (MISCELLANEOUS) ×6 IMPLANT
DECANTER SPIKE VIAL GLASS SM (MISCELLANEOUS) ×6 IMPLANT
DRAPE C-ARM XRAY 36X54 (DRAPES) ×3 IMPLANT
DRAPE LAPAROTOMY TRNSV 106X77 (MISCELLANEOUS) ×3 IMPLANT
DRSG TEGADERM 2-3/8X2-3/4 SM (GAUZE/BANDAGES/DRESSINGS) ×3 IMPLANT
DRSG TEGADERM 4X4.75 (GAUZE/BANDAGES/DRESSINGS) ×3 IMPLANT
DRSG TELFA 4X3 1S NADH ST (GAUZE/BANDAGES/DRESSINGS) ×3 IMPLANT
ELECT REM PT RETURN 9FT ADLT (ELECTROSURGICAL) ×3
ELECTRODE REM PT RTRN 9FT ADLT (ELECTROSURGICAL) ×2 IMPLANT
GLOVE BIO SURGEON STRL SZ7.5 (GLOVE) ×3 IMPLANT
GLOVE INDICATOR 8.0 STRL GRN (GLOVE) ×3 IMPLANT
GOWN STRL REUS W/ TWL LRG LVL3 (GOWN DISPOSABLE) ×4 IMPLANT
GOWN STRL REUS W/TWL LRG LVL3 (GOWN DISPOSABLE) ×2
KIT PORT POWER 8FR ISP CVUE (Port) ×3 IMPLANT
KIT TURNOVER KIT A (KITS) ×3 IMPLANT
LABEL OR SOLS (LABEL) ×3 IMPLANT
NEEDLE HYPO 25X1 1.5 SAFETY (NEEDLE) ×3 IMPLANT
NS IRRIG 500ML POUR BTL (IV SOLUTION) ×3 IMPLANT
PACK BASIN MINOR ARMC (MISCELLANEOUS) ×3 IMPLANT
PACK PORT-A-CATH (MISCELLANEOUS) ×3 IMPLANT
SHEARS FOC LG CVD HARMONIC 17C (MISCELLANEOUS) ×3 IMPLANT
STRIP CLOSURE SKIN 1/2X4 (GAUZE/BANDAGES/DRESSINGS) ×3 IMPLANT
SUT PROLENE 3 0 SH DA (SUTURE) ×3 IMPLANT
SUT VIC AB 2-0 CT1 27 (SUTURE) ×1
SUT VIC AB 2-0 CT1 TAPERPNT 27 (SUTURE) ×2 IMPLANT
SUT VIC AB 3-0 54X BRD REEL (SUTURE) ×2 IMPLANT
SUT VIC AB 3-0 BRD 54 (SUTURE) ×1
SUT VIC AB 3-0 SH 27 (SUTURE) ×1
SUT VIC AB 3-0 SH 27X BRD (SUTURE) ×2 IMPLANT
SUT VIC AB 4-0 FS2 27 (SUTURE) ×3 IMPLANT
SUT VIC AB 4-0 PS2 18 (SUTURE) ×3 IMPLANT
SWABSTK COMLB BENZOIN TINCTURE (MISCELLANEOUS) ×3 IMPLANT
SYR 10ML LL (SYRINGE) ×3 IMPLANT
SYR 10ML SLIP (SYRINGE) ×3 IMPLANT

## 2017-07-14 NOTE — H&P (Signed)
No change in clinical history or exam.  For right power port placement and clip placement of left axillary node.

## 2017-07-14 NOTE — Op Note (Signed)
Preoperative diagnosis: Stage II left breast cancer, need for neoadjuvant chemotherapy.  Postoperative diagnosis: Same.  Operative procedure: 1) placement of radiologic marker and left axillary nodal metastasis; 2) right subclavian PowerPort placement with ultrasound and fluoroscopic guidance.  Operating Surgeon Hervey Ard, MD.  Anesthesia: General by LMA, 10 cc 1% plain Xylocaine.  Estimated blood loss: Less than 5 cc.  Clinical note: This 52 year old woman is presented with a large mass in the lower outer quadrant of the left breast.  FNA sampling of the left axillary node was positive for metastatic cancer.  She is felt to be a candidate for neoadjuvant chemotherapy.  Plans are for clip placement of the lymph node to follow response to care chemotherapy treatment.  Operative note: The patient received Kefzol 2 g prior to the procedure.  The left axilla was prepped with ChloraPrep and draped.  Ultrasound was used to identify the previously sampled lymph node.  A small skin incision was made with a 15-gauge blade.  A gel Mark clip was placed under ultrasound guidance.  The skin defect was closed with benzoin and Steri-Strip followed by Telfa and Tegaderm dressing.  Attention was turned to the right chest.  In Trendelenburg position the subclavian vein was assessed by ultrasound and found to be patent.  Local anesthesia was infiltrated.  The vein was cannulated on a single stick followed by passage of a guidewire under ultrasound guidance.  The the catheter was tunneled to a pocket on the right anterior chest.  The port was attached.  The based bar port easily irrigated and aspirated.  It was flushed with 10 cc of injectable saline at the end of the procedure.  Fluoroscopy showed the catheter tip at the junction of the SVC and right atrium.  The port was anchored to the deep tissue with interrupted 3-0 Prolene sutures x2.  The subtendinous fat was approximated with a running 3-0 Vicryl suture.   The skin was closed with a running 4-0 Vicryl suture.  Benzoin, Steri-Strips, Telfa and Tegaderm dressings were applied to all wounds.  Patient tolerated the procedure well and was taken recovery in stable condition.  Erect portable chest x-ray showed the catheter tip as described above and no evidence of pneumothorax.

## 2017-07-14 NOTE — Discharge Instructions (Signed)

## 2017-07-14 NOTE — Anesthesia Procedure Notes (Signed)
Procedure Name: LMA Insertion Date/Time: 07/14/2017 12:46 PM Performed by: Lowry Bowl, CRNA Pre-anesthesia Checklist: Patient identified, Emergency Drugs available, Suction available and Patient being monitored Patient Re-evaluated:Patient Re-evaluated prior to induction Oxygen Delivery Method: Circle system utilized Preoxygenation: Pre-oxygenation with 100% oxygen Induction Type: IV induction Ventilation: Mask ventilation without difficulty LMA: LMA inserted LMA Size: 3.0 Number of attempts: 1 Placement Confirmation: breath sounds checked- equal and bilateral and positive ETCO2 Tube secured with: Tape Dental Injury: Teeth and Oropharynx as per pre-operative assessment

## 2017-07-14 NOTE — Anesthesia Post-op Follow-up Note (Signed)
Anesthesia QCDR form completed.        

## 2017-07-14 NOTE — Anesthesia Preprocedure Evaluation (Addendum)
Anesthesia Evaluation  Patient identified by MRN, date of birth, ID band Patient awake    Reviewed: Allergy & Precautions, NPO status , Patient's Chart, lab work & pertinent test results  History of Anesthesia Complications Negative for: history of anesthetic complications  Airway Mallampati: III       Dental   Pulmonary neg sleep apnea, COPD,  COPD inhaler, Current Smoker,           Cardiovascular hypertension, Pt. on medications (-) Past MI and (-) CHF (-) dysrhythmias (-) Valvular Problems/Murmurs     Neuro/Psych neg Seizures Depression    GI/Hepatic Neg liver ROS, neg GERD  ,  Endo/Other  diabetes, Type 2, Oral Hypoglycemic Agents, Insulin Dependent  Renal/GU negative Renal ROS     Musculoskeletal   Abdominal   Peds  Hematology   Anesthesia Other Findings   Reproductive/Obstetrics                            Anesthesia Physical Anesthesia Plan  ASA: III  Anesthesia Plan: General   Post-op Pain Management:    Induction: Intravenous  PONV Risk Score and Plan: 2 and Ondansetron and Dexamethasone  Airway Management Planned: LMA  Additional Equipment:   Intra-op Plan:   Post-operative Plan:   Informed Consent: I have reviewed the patients History and Physical, chart, labs and discussed the procedure including the risks, benefits and alternatives for the proposed anesthesia with the patient or authorized representative who has indicated his/her understanding and acceptance.     Plan Discussed with:   Anesthesia Plan Comments:         Anesthesia Quick Evaluation

## 2017-07-14 NOTE — Progress Notes (Signed)
Dr. [name] into see 

## 2017-07-14 NOTE — Transfer of Care (Signed)
Immediate Anesthesia Transfer of Care Note  Patient: Carolyn Roy  Procedure(s) Performed: INSERTION PORT-A-CATH (Right ) INSERTION GEL MARK CLIP LEFT AXILLA (Left )  Patient Location: PACU  Anesthesia Type:General  Level of Consciousness: awake, oriented and patient cooperative  Airway & Oxygen Therapy: Patient Spontanous Breathing  Post-op Assessment: Report given to RN, Post -op Vital signs reviewed and stable and Patient moving all extremities  Post vital signs: Reviewed and stable  Last Vitals:  Vitals Value Taken Time  BP 95/57 07/14/2017  1:45 PM  Temp    Pulse 82 07/14/2017  1:50 PM  Resp 17 07/14/2017  1:50 PM  SpO2 96 % 07/14/2017  1:50 PM  Vitals shown include unvalidated device data.  Last Pain:  Vitals:   07/14/17 0951  TempSrc: Oral  PainSc: 0-No pain         Complications: No apparent anesthesia complications

## 2017-07-17 ENCOUNTER — Encounter
Admission: RE | Admit: 2017-07-17 | Discharge: 2017-07-17 | Disposition: A | Discharge status: Home / Self Care | Payer: Managed Care, Other (non HMO) | Source: Ambulatory Visit | Attending: Nurse Practitioner | Admitting: Nurse Practitioner

## 2017-07-17 ENCOUNTER — Encounter: Payer: Self-pay | Admitting: General Surgery

## 2017-07-17 DIAGNOSIS — N888 Other specified noninflammatory disorders of cervix uteri: Secondary | ICD-10-CM | POA: Diagnosis not present

## 2017-07-17 LAB — GLUCOSE, CAPILLARY: Glucose-Capillary: 104 mg/dL — ABNORMAL HIGH (ref 65–99)

## 2017-07-17 IMAGING — CT NM PET TUM IMG INITIAL (PI) SKULL BASE T - THIGH
1 of 9 series · 1 of 25 positions shown · non-contrast
Comparison: CT scan [DATE]

CLINICAL DATA: Initial treatment strategy for breast cancer and
cervical mass.

EXAM:
NUCLEAR MEDICINE PET SKULL BASE TO THIGH
TECHNIQUE: 10.2 mCi F-18 FDG was injected intravenously. Full-ring PET imaging
was performed from the skull base to thigh after the radiotracer. CT
data was obtained and used for attenuation correction and anatomic
localization.
Fasting blood glucose: 104 mg/dl

[Series 3: ct wb 5.0 b30f · axial · 5.0mm · 0.98mm/px · 1 of 290 slices shown]
[im 290/290  brain]
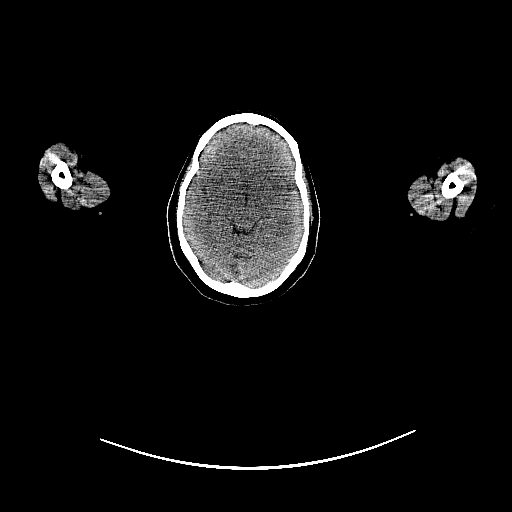

[1 of 25 positions shown; findings below may reference images not displayed]

FINDINGS: Mediastinal blood pool activity: SUV max

NECK: No significant abnormal hypermetabolic activity in this
region.

Incidental CT findings: none

CHEST: Central left breast mass just below the nipple level,
approximately 4.3 by 3.2 cm on image 89/3, maximum SUV 8.3.
Low-level metabolic activity in a 0.8 cm left axillary lymph node on
image 71/3, maximum SUV 1.7.

Incidental CT findings: Port-A-Cath tip: Right atrium. Mild
atherosclerotic calcification of the aortic arch.

ABDOMEN/PELVIS: No significant abnormal hypermetabolic activity in
this region. No definite abnormal activity along the cervix. No
hypermetabolic adenopathy.

Incidental CT findings: Mild prominence of endometrial stripe.

SKELETON: No significant abnormal hypermetabolic activity in this
region.

Incidental CT findings: none
IMPRESSION: 1. Hypermetabolic central left breast mass, maximum SUV 8.3,
compatible with malignancy. Subtle accentuated activity in a small
left axillary lymph node, but below background blood pool activity.
2. The cervical prominence noted on recent CT is not appreciably
hypermetabolic. If there is underlying cervical cancer it is not
appreciably hypermetabolic. However, there is some mild prominence
of the endometrial stripe, which could result from cervical
stenosis.
3.  Aortic Atherosclerosis ([M1]-[M1]).

## 2017-07-17 MED ORDER — FLUDEOXYGLUCOSE F - 18 (FDG) INJECTION
9.7000 | Freq: Once | INTRAVENOUS | Status: AC | PRN
  Administered 2017-07-17: 10.15 via INTRAVENOUS

## 2017-07-18 ENCOUNTER — Other Ambulatory Visit: Payer: Self-pay

## 2017-07-18 NOTE — Anesthesia Postprocedure Evaluation (Signed)
Anesthesia Post Note  Patient: Carolyn Roy  Procedure(s) Performed: INSERTION PORT-A-CATH (Right ) INSERTION GEL MARK CLIP LEFT AXILLA (Left )  Patient location during evaluation: PACU Anesthesia Type: General Level of consciousness: awake and alert Pain management: pain level controlled Vital Signs Assessment: post-procedure vital signs reviewed and stable Respiratory status: spontaneous breathing, nonlabored ventilation, respiratory function stable and patient connected to nasal cannula oxygen Cardiovascular status: blood pressure returned to baseline and stable Postop Assessment: no apparent nausea or vomiting Anesthetic complications: no     Last Vitals:  Vitals:   07/14/17 1422 07/14/17 1512  BP: 96/60 (!) 117/59  Pulse: 77 82  Resp: 16   Temp: 36.4 C   SpO2: 96% 100%    Last Pain:  Vitals:   07/17/17 0814  TempSrc:   PainSc: 0-No pain                 Alphonsus Sias

## 2017-07-19 ENCOUNTER — Inpatient Hospital Stay: Payer: Managed Care, Other (non HMO) | Attending: Obstetrics and Gynecology | Admitting: Obstetrics and Gynecology

## 2017-07-19 ENCOUNTER — Telehealth: Payer: Self-pay

## 2017-07-19 VITALS — BP 105/71 | HR 98 | Temp 98.2°F | Resp 18 | Ht 65.0 in | Wt 190.0 lb

## 2017-07-19 DIAGNOSIS — Z7689 Persons encountering health services in other specified circumstances: Secondary | ICD-10-CM | POA: Diagnosis not present

## 2017-07-19 DIAGNOSIS — N841 Polyp of cervix uteri: Secondary | ICD-10-CM | POA: Diagnosis not present

## 2017-07-19 DIAGNOSIS — E119 Type 2 diabetes mellitus without complications: Secondary | ICD-10-CM

## 2017-07-19 DIAGNOSIS — Z803 Family history of malignant neoplasm of breast: Secondary | ICD-10-CM | POA: Insufficient documentation

## 2017-07-19 DIAGNOSIS — C50512 Malignant neoplasm of lower-outer quadrant of left female breast: Secondary | ICD-10-CM | POA: Diagnosis not present

## 2017-07-19 DIAGNOSIS — E1165 Type 2 diabetes mellitus with hyperglycemia: Secondary | ICD-10-CM | POA: Insufficient documentation

## 2017-07-19 DIAGNOSIS — Z7984 Long term (current) use of oral hypoglycemic drugs: Secondary | ICD-10-CM

## 2017-07-19 DIAGNOSIS — Z5111 Encounter for antineoplastic chemotherapy: Secondary | ICD-10-CM | POA: Insufficient documentation

## 2017-07-19 DIAGNOSIS — Z79899 Other long term (current) drug therapy: Secondary | ICD-10-CM | POA: Diagnosis not present

## 2017-07-19 DIAGNOSIS — F17210 Nicotine dependence, cigarettes, uncomplicated: Secondary | ICD-10-CM | POA: Insufficient documentation

## 2017-07-19 DIAGNOSIS — Z794 Long term (current) use of insulin: Secondary | ICD-10-CM | POA: Diagnosis not present

## 2017-07-19 DIAGNOSIS — Z170 Estrogen receptor positive status [ER+]: Secondary | ICD-10-CM | POA: Diagnosis not present

## 2017-07-19 NOTE — Progress Notes (Signed)
Gynecologic Oncology Interval Visit   Referring Providers: Dr. Nolon Stalls (Medical Oncology at [facility]) Dr. Hervey Ard  Chief Complaint: Cervical Mass concerning for malignancy, pathology c/w inflamed polyp with focal glandular atypia  Subjective:  Carolyn Roy is a 52 y.o. female who is seen in consultation from Dr. Mike Gip for cervical mass on 07/05/2017.    07/05/2017:  DIAGNOSIS:  A. CERVIX; BIOPSY:  - INFLAMED POLYP WITH TUBAL METAPLASIA AND FOCAL GLANDULAR ATYPIA, SEE NOTE.  - MULTIPLE DEEPER LEVELS WERE EXAMINED.   Note: A p16 stain is obtained and highlights a focal area of atypia as  well as inflamed epithelium. Pilar Plate malignancy is not identified in this  material.   Pap: unsatisfactory for evaluation, insufficient squamous cellularity. HRHPV negative.   07/17/2017 IMPRESSION: 1. Hypermetabolic central left breast mass, maximum SUV 8.3, compatible with malignancy. Subtle accentuated activity in a small left axillary lymph node, but below background blood pool activity. 2. The cervical prominence noted on recent CT is not appreciably hypermetabolic. If there is underlying cervical cancer it is not appreciably hypermetabolic. However, there is some mild prominence of the endometrial stripe, which could result from cervical stenosis. 3.  Aortic Atherosclerosis (ICD10-I70.0).  Or note on 07/14/2017 she underwent 1) placement of radiologic marker and left axillary nodal metastasis; 2) right subclavian PowerPort placement with ultrasound and fluoroscopic guidance with Dr. Bary Castilla.  Today, she returns to clinic for discussion of pathology results and follow-up for cervical biopsy. She reports feeling fatigued chronically. She plans to start chemotherapy tomorrow with Dr. Mike Gip for breast cancer. She reports recovering from biopsy well, without significant complication. She had mild pain for 1-2 days and persistent, mild spotting. No fever or chills. No discharge.      Gynecologic Oncology History Carolyn Roy is a pleasant female who is seen in consultation from Dr. Mike Gip for cervical mass.   Patient was initially referred to medical oncology for clinical stage IIIa left breast cancer, s/p biopsy. Pathology revealed grade III invasive ductal carcinoma area axillary FNA revealed malignant cells.  Tumor was ER positive, PR positive, HER-2/neu negative.  CA-27-29 was normal.  FSH and estradiol confirmed premenopausal status.  CT C/A/P on 06/29/2017 revealed: an infiltrative heterogeneously enhancing mass in the cervical region extending into the lower uterine segment that was concerning for primary cervical carcinoma.   IMPRESSION: 1. Large mass in the central aspect of the left breast measuring approximately 3.7 x 4.4 x 4.2 cm. There are borderline enlarged left axillary and left internal mammary lymph nodes which are nonspecific but suspicious in the setting of known breast cancer. No other definite signs of metastatic disease elsewhere in the chest, abdomen or pelvis. 2. However, there is what appears to be an infiltrative heterogeneously enhancing mass in the cervical region extending into the lower uterine segment, concerning for concurrent primary cervical carcinoma.  "Cervical region extending into the lower uterine segment overall estimated to measure approximately 4.5 x 5.2 x 4.1"  Per Dr. Mike Gip, currently they plan for neoadjuvant chemotherapy with standard dose dense AC every 2 weeks for 4 cycles followed by weekly Taxol for 12 weeks.    She has not been getting regular Pap smears/pelvic exams and estimates last Pap smear approximately 2009.  She reports a history of abnormal pap smears and believes that she may have had '3 spots cut out of my cervix 30 years ago'.    Genetic Testing- [employer] 83 gene multi cancer panel.  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   Problem List: Patient Active Problem List   Diagnosis Date Noted  . Cervical mass 07/05/2017  . Family history of breast cancer   . Cancer of midline of breast, left ([place]) 06/15/2017  . Stage 1 mild COPD by GOLD classification ([place]) 03/31/2017  . Hypercholesteremia 03/03/2017  . Tobacco abuse 11/15/2016  . Depression, major, single episode, severe ([place]) 11/15/2016  . Knee pain, left 05/29/2015  . Poorly controlled type 2 diabetes mellitus ([place]) 03/30/2015  . Chronic fatigue 03/30/2015  . Severe depression ([place]) 03/30/2015   Past Medical History: Past Medical History:  Diagnosis Date  . Cancer (HCC)    BREAST  . Depression   . Diabetes mellitus without complication ([place])   . Endometriosis   . Family history of breast cancer   . Hyperlipidemia   . Hypertension   . Ovarian mass   . Pneumonia    Past Surgical History: Past Surgical History:  Procedure Laterality Date  . AXILLARY LYMPH NODE BIOPSY Left 07/14/2017   Procedure: INSERTION GEL MARK CLIP LEFT AXILLA;  Surgeon: Robert Bellow, MD;  Location: [facility] ORS;  Service: General;  Laterality: Left;  . OOPHORECTOMY    . PORTACATH PLACEMENT Right 07/14/2017   Procedure: INSERTION PORT-A-CATH;  Surgeon: Robert Bellow, MD;  Location: [facility] ORS;  Service: General;  Laterality: Right;  . TUBAL LIGATION     Past Gynecologic History:  G3P3 Age of menarche: 69 Duration of periods: 1-2 days Contraception: tubal  OB History:  OB History  Gravida Para Term Preterm AB Living  3 3          SAB TAB Ectopic Multiple Live Births               # Outcome Date GA Lbr Len/2nd Weight Sex Delivery Anes PTL Lv  3 Para           2 Para           1 Para             Obstetric Comments  1st Menstrual Cycle:  14   1st Pregnancy:  32    Family History: Family History  Problem Relation Age of Onset  . Other Father        No info about father or paternal  relatives  . Diabetes Brother   . Pancreatitis Brother   . Prostate cancer Brother 30       currently 77 / maternal half-brother  . Breast cancer Maternal Grandmother 40       deceased 51s  . Breast cancer Maternal Aunt 65       currently 33  . Breast cancer Other 31       mother's sister; deceased 55  . Breast cancer Other        mother's sister; age at dx unknown    Social History: Social History   Socioeconomic History  . Marital status: Single    Spouse name: Not on file  . Number of children: Not on file  . Years of education: Not on file  . Highest education level: Not on file  Occupational History  . Not on file  Social Needs  . Financial resource strain: Not on file  . Food insecurity:    Worry: Not on file    Inability: Not on file  . Transportation needs:  Medical: Not on file    Non-medical: Not on file  Tobacco Use  . Smoking status: Current Every Day Smoker    Packs/day: 0.50    Years: 11.00    Pack years: 5.50    Types: Cigarettes  . Smokeless tobacco: Former Systems developer    Types: Snuff  Substance and Sexual Activity  . Alcohol use: No    Alcohol/week: 0.0 oz  . Drug use: No  . Sexual activity: Yes  Lifestyle  . Physical activity:    Days per week: Not on file    Minutes per session: Not on file  . Stress: Not on file  Relationships  . Social connections:    Talks on phone: Not on file    Gets together: Not on file    Attends religious service: Not on file    Active member of club or organization: Not on file    Attends meetings of clubs or organizations: Not on file    Relationship status: Not on file  . Intimate partner violence:    Fear of current or ex partner: Not on file    Emotionally abused: Not on file    Physically abused: Not on file    Forced sexual activity: Not on file  Other Topics Concern  . Not on file  Social History Narrative  . Not on file   Allergies: Allergies  Allergen Reactions  . Aspirin Nausea And Vomiting    Current Medications: Current Outpatient Medications  Medication Sig Dispense Refill  . albuterol (PROVENTIL HFA;VENTOLIN HFA) 108 (90 Base) MCG/ACT inhaler Inhale 2 puffs into the lungs every 6 (six) hours as needed for wheezing or shortness of breath. 1 Inhaler 0  . atorvastatin (LIPITOR) 40 MG tablet Take 1 tablet (40 mg total) by mouth daily. 90 tablet 3  . glucose blood (ONE TOUCH ULTRA TEST) test strip Use up to 4 times/day 100 each 12  . Insulin Glargine (BASAGLAR KWIKPEN) 100 UNIT/ML SOPN INJECT 0.24 MLS (24 UNITS TOTAL) INTO THE SKIN AT BEDTIME. 15 pen 2  . Insulin Pen Needle 32G X 4 MM MISC 1 Units by Does not apply route every morning. Pen needles 90 each 3  . INVOKANA 100 MG TABS tablet TAKE 1 TABLET (100 MG TOTAL) BY MOUTH DAILY BEFORE BREAKFAST. 30 tablet 0  . lidocaine-prilocaine (EMLA) cream Apply to affected area once 30 g 3  . Melatonin 10 MG TABS Take 20 mg by mouth at bedtime as needed (sleep).    . metFORMIN (GLUCOPHAGE) 500 MG tablet TAKE 2 TABLETS (1,000 MG TOTAL) BY MOUTH 2 (TWO) TIMES DAILY WITH A MEAL. 120 tablet 1  . ondansetron (ZOFRAN) 8 MG tablet Take 1 tablet (8 mg total) by mouth 2 (two) times daily as needed. Start on the third day after chemotherapy. (Patient not taking: Reported on 07/19/2017) 30 tablet 1  . prochlorperazine (COMPAZINE) 10 MG tablet Take 1 tablet (10 mg total) by mouth every 6 (six) hours as needed (Nausea or vomiting). (Patient not taking: Reported on 07/19/2017) 30 tablet 1  . Semaglutide (OZEMPIC) 0.25 or 0.5 MG/DOSE SOPN Inject 0.25 mg into the skin once a week. 1 pen 0  . venlafaxine XR (EFFEXOR-XR) 150 MG 24 hr capsule TAKE 1 CAPSULE (150 MG TOTAL) BY MOUTH DAILY WITH BREAKFAST. (Patient not taking: Reported on 07/12/2017) 90 capsule 1   No current facility-administered medications for this visit.     Review of Systems   General: fatigue and weakness  HEENT: no complaints  Lungs: no complaints  Cardiac: no complaints  GI: constipation  & poor appetite  GU: vaginal spotting  Musculoskeletal: back pain (chronic)  Extremities: no complaints  Skin: no complaints  Neuro: no complaints  Endocrine: no complaints  Psych: no complaints      Objective:  Physical Examination:  BP 105/71   Pulse 98   Temp 98.2 F (36.8 C) (Tympanic)   Resp 18   Ht '5\' 5"'  (1.651 m)   Wt 190 lb (86.2 kg)   LMP  (LMP Unknown)   BMI 31.62 kg/m     ECOG Performance Status: 0 - Asymptomatic  GENERAL: Patient is a well appearing female in no acute distress HEENT:  Sclerae anicteric.  Oropharynx clear and moist. No ulcerations or evidence of oropharyngeal candidiasis. Neck is supple.  NODES:  No cervical, supraclavicular, or axillary lymphadenopathy palpated.  LUNGS:  Clear to auscultation bilaterally.  No wheezes or rhonchi. HEART:  Regular rate and rhythm. No murmur appreciated. ABDOMEN:  Soft, nontender.  Positive, normoactive bowel sounds. No organomegaly palpated. MSK:  No focal spinal tenderness to palpation. Full range of motion bilaterally in the upper extremities. EXTREMITIES:  No peripheral edema.   SKIN:  Clear with no obvious rashes or skin changes. No nail dyscrasia. NEURO:  Nonfocal. Well oriented.  Appropriate affect. BREAST: deferred. Known breast cancer. Breast exams with Dr. Mike Gip.   Pelvic: Exam Chaperoned by RN EGBUS: no lesions Cervix: 2 cm endocervical lesion located at the os and ballooning out the cervix, tender, mobile, firm to palpation, friable. Surgical biopsy site well healed- no evidence of infection.  Vagina: no lesions, no discharge or bleeding Uterus: unable to determine size due to tenderness and habitus, mobile Adnexa: no palpable masses Rectovaginal: deferred  Lab Review Labs on site today: n/a  Radiologic Imaging: PET 07/17/17 IMPRESSION: 1. Hypermetabolic central left breast mass, maximum SUV 8.3, compatible with malignancy. Subtle accentuated activity in a small left axillary lymph node, but  below background blood pool activity. 2. The cervical prominence noted on recent CT is not appreciably hypermetabolic. If there is underlying cervical cancer it is not appreciably hypermetabolic. However, there is some mild prominence of the endometrial stripe, which could result from cervical stenosis. 3.  Aortic Atherosclerosis (ICD10-I70.0).  Electronically Signed   By: Van Clines M.D.   On: 07/17/2017 15:16     Assessment:  Carolyn Roy is a 52 y.o. female with incidental finding of cervical mass on recent CT scan which was performed for treatment planning for breast cancer. Radiologic and pathology findings consistent with inflamed cervical polyp (5.2 cm) with focal glandular atypia. Pap unsatisfactory, HRHPV negative. PET negative for appreciable metabolic activity, reassuring.  Possible thickened endometrial stripe.   Medical co-morbidities complicating care: Type 2 Diabetes Mellitus (poorly controlled), Smoker, stage T3N1Mx (stage II) Breast Cancer, COPD. Body mass index is 31.62 kg/m.  Plan:   Problem List Items Addressed This Visit    None    Visit Diagnoses    Cervical polyp    -  Primary     Reviewed biopsy results and PET/CT consistent with large inflamed cervical polyp with focal glandular atypia in the setting of stage II breast cancer which will require neoadjuvant chemotherapy.   If she did not have newly diagnosed breast cancer we would evaluate further. Given the size of the polyp it will be difficult to perform a cold-knife conization and/or polypectomy there appears to be a separate intrauterine process.   Recommend initiate therapy for breast  cancer. After completion chemotherapy obtain pelvic MRI to assess gynecologic anatomy further. She will most likely need a gynecologic procedure - perhaps even hysterectomy - given size of lesion.      Beckey Rutter, DNP, AGNP-C [facility] [phone] (work cell) [phone]  (office) 07/19/17 9:11 AM  I personally had a face to face interaction and evaluated the patient jointly with the NP, Ms. Beckey Rutter.  I have reviewed her history and available records andthe key portions of the physical exam including with my findings confirming those documented above by the APP.  I have discussed the case with the APP and the patient.  I agree with the above documentation, assessment and plan which was fully formulated by me.  Counseling was completed by me.   I personally saw the patient and performed a substantive portion of this encounter in conjunction with the listed APP as documented above.  Sanaz Scarlett Gaetana Michaelis, MD     CC:  Referring Providers: Dr. Nolon Stalls (Medical Oncology at [place]) Dr. Hervey Ard

## 2017-07-19 NOTE — Progress Notes (Signed)
Pt has back pain from not sleeping well. She has vaginal bleeding and pelvic pain. Fatigued.

## 2017-07-19 NOTE — Telephone Encounter (Signed)
error 

## 2017-07-20 ENCOUNTER — Other Ambulatory Visit: Payer: Self-pay | Admitting: Hematology and Oncology

## 2017-07-20 ENCOUNTER — Other Ambulatory Visit: Payer: Self-pay

## 2017-07-20 ENCOUNTER — Inpatient Hospital Stay: Payer: Managed Care, Other (non HMO)

## 2017-07-20 ENCOUNTER — Encounter: Payer: Self-pay | Admitting: Hematology and Oncology

## 2017-07-20 ENCOUNTER — Ambulatory Visit: Payer: Managed Care, Other (non HMO) | Admitting: Hematology and Oncology

## 2017-07-20 ENCOUNTER — Telehealth: Payer: Self-pay | Admitting: Urgent Care

## 2017-07-20 ENCOUNTER — Inpatient Hospital Stay (HOSPITAL_BASED_OUTPATIENT_CLINIC_OR_DEPARTMENT_OTHER): Payer: Managed Care, Other (non HMO) | Admitting: Hematology and Oncology

## 2017-07-20 VITALS — BP 119/81 | HR 91 | Temp 97.9°F | Resp 20 | Ht 65.0 in | Wt 187.9 lb

## 2017-07-20 DIAGNOSIS — Z794 Long term (current) use of insulin: Secondary | ICD-10-CM

## 2017-07-20 DIAGNOSIS — C50812 Malignant neoplasm of overlapping sites of left female breast: Secondary | ICD-10-CM

## 2017-07-20 DIAGNOSIS — Z79899 Other long term (current) drug therapy: Secondary | ICD-10-CM

## 2017-07-20 DIAGNOSIS — E119 Type 2 diabetes mellitus without complications: Secondary | ICD-10-CM

## 2017-07-20 DIAGNOSIS — F17210 Nicotine dependence, cigarettes, uncomplicated: Secondary | ICD-10-CM

## 2017-07-20 DIAGNOSIS — Z803 Family history of malignant neoplasm of breast: Secondary | ICD-10-CM | POA: Diagnosis not present

## 2017-07-20 DIAGNOSIS — N841 Polyp of cervix uteri: Secondary | ICD-10-CM

## 2017-07-20 DIAGNOSIS — Z7984 Long term (current) use of oral hypoglycemic drugs: Secondary | ICD-10-CM

## 2017-07-20 DIAGNOSIS — Z7189 Other specified counseling: Secondary | ICD-10-CM | POA: Insufficient documentation

## 2017-07-20 DIAGNOSIS — R739 Hyperglycemia, unspecified: Secondary | ICD-10-CM

## 2017-07-20 DIAGNOSIS — Z170 Estrogen receptor positive status [ER+]: Secondary | ICD-10-CM | POA: Diagnosis not present

## 2017-07-20 DIAGNOSIS — Z5111 Encounter for antineoplastic chemotherapy: Secondary | ICD-10-CM | POA: Insufficient documentation

## 2017-07-20 LAB — MAGNESIUM: Magnesium: 1.9 mg/dL (ref 1.7–2.4)

## 2017-07-20 LAB — CBC WITH DIFFERENTIAL/PLATELET
Basophils Absolute: 0.1 10*3/uL (ref 0–0.1)
Basophils Relative: 1 %
Eosinophils Absolute: 0.1 10*3/uL (ref 0–0.7)
Eosinophils Relative: 1 %
HCT: 44.6 % (ref 35.0–47.0)
Hemoglobin: 15.5 g/dL (ref 12.0–16.0)
Lymphocytes Relative: 33 %
Lymphs Abs: 2.7 10*3/uL (ref 1.0–3.6)
MCH: 32.8 pg (ref 26.0–34.0)
MCHC: 34.8 g/dL (ref 32.0–36.0)
MCV: 94.1 fL (ref 80.0–100.0)
Monocytes Absolute: 0.5 10*3/uL (ref 0.2–0.9)
Monocytes Relative: 6 %
Neutro Abs: 4.8 10*3/uL (ref 1.4–6.5)
Neutrophils Relative %: 59 %
Platelets: 242 10*3/uL (ref 150–440)
RBC: 4.74 MIL/uL (ref 3.80–5.20)
RDW: 13.4 % (ref 11.5–14.5)
WBC: 8.2 10*3/uL (ref 3.6–11.0)

## 2017-07-20 LAB — COMPREHENSIVE METABOLIC PANEL
ALT: 18 U/L (ref 14–54)
AST: 19 U/L (ref 15–41)
Albumin: 3.7 g/dL (ref 3.5–5.0)
Alkaline Phosphatase: 106 U/L (ref 38–126)
Anion gap: 10 (ref 5–15)
BUN: 10 mg/dL (ref 6–20)
CO2: 24 mmol/L (ref 22–32)
Calcium: 9.1 mg/dL (ref 8.9–10.3)
Chloride: 103 mmol/L (ref 101–111)
Creatinine, Ser: 0.55 mg/dL (ref 0.44–1.00)
GFR calc Af Amer: >60 mL/min (ref >60)
GFR calc non Af Amer: >60 mL/min (ref >60)
Glucose, Bld: 403 mg/dL — ABNORMAL HIGH (ref 65–99)
Potassium: 3.9 mmol/L (ref 3.5–5.1)
Sodium: 137 mmol/L (ref 135–145)
Total Bilirubin: 0.5 mg/dL (ref 0.3–1.2)
Total Protein: 7 g/dL (ref 6.5–8.1)

## 2017-07-20 MED ORDER — CYCLOPHOSPHAMIDE CHEMO INJECTION 1 GM
600.0000 mg/m2 | Freq: Once | INTRAMUSCULAR | Status: AC
  Administered 2017-07-20: 1220 mg via INTRAVENOUS
  Filled 2017-07-20: qty 50

## 2017-07-20 MED ORDER — PALONOSETRON HCL INJECTION 0.25 MG/5ML
0.2500 mg | Freq: Once | INTRAVENOUS | Status: AC
  Administered 2017-07-20: 0.25 mg via INTRAVENOUS
  Filled 2017-07-20: qty 5

## 2017-07-20 MED ORDER — HEPARIN SOD (PORK) LOCK FLUSH 100 UNIT/ML IV SOLN
500.0000 [IU] | Freq: Once | INTRAVENOUS | Status: AC | PRN
  Administered 2017-07-20: 500 [IU]
  Filled 2017-07-20: qty 5

## 2017-07-20 MED ORDER — SODIUM CHLORIDE 0.9% FLUSH
10.0000 mL | Freq: Once | INTRAVENOUS | Status: AC
  Administered 2017-07-20: 10 mL via INTRAVENOUS
  Filled 2017-07-20: qty 10

## 2017-07-20 MED ORDER — SODIUM CHLORIDE 0.9 % IV SOLN
Freq: Once | INTRAVENOUS | Status: AC
  Administered 2017-07-20: 10:00:00 via INTRAVENOUS
  Filled 2017-07-20: qty 5

## 2017-07-20 MED ORDER — DOXORUBICIN HCL CHEMO IV INJECTION 2 MG/ML
60.0000 mg/m2 | Freq: Once | INTRAVENOUS | Status: AC
  Administered 2017-07-20: 122 mg via INTRAVENOUS
  Filled 2017-07-20: qty 50

## 2017-07-20 MED ORDER — SODIUM CHLORIDE 0.9 % IV SOLN
Freq: Once | INTRAVENOUS | Status: AC
  Administered 2017-07-20: 10:00:00 via INTRAVENOUS
  Filled 2017-07-20: qty 1000

## 2017-07-20 MED ORDER — HEPARIN SOD (PORK) LOCK FLUSH 100 UNIT/ML IV SOLN: 500.0000 [IU] | Freq: Once | INTRAVENOUS | Status: DC

## 2017-07-20 NOTE — Telephone Encounter (Signed)
Several attempts have been made to contact patient to follow up on her blood sugars following treatment today. Pre-treatment glucose 403. She was given dexamethasone 12 mg IV as a part of her treatment today. Patient is not answering her phone. Messages have been left for both the patient, as well as for her SO Marden Noble). Will continue to attempt contact.   Additionally, message has been sent to PCP to make her aware of elevated glucose and need for follow up. Dr. Mike Gip aware.   Honor Loh, MSN, APRN, FNP-C, CEN Oncology/Hematology Nurse Practitioner  [facility] 07/20/17 6:28 PM

## 2017-07-20 NOTE — Progress Notes (Signed)
[facility] day:  07/20/2017  Chief Complaint: Carolyn Roy is a 52 y.o. female with clinical T3N1Mx left breast cancer and cervical polyp who is seen for review of interval studies, discussion regarding direction of therapy, and assessment prior to cycle #1 AC.  HPI:  The patient was last seen in the medical oncology clinic on 07/07/2017.  At that time,  she was less fatigued.  Exam revealed a 5 cm central left breast mass and a 2 cm endocervical lesion. Cervical biopsy was pending.  PET scan was ordered.  Cervical biopsy on 07/05/2017 revealed an inflamed polyp with tubal metaplasia and focal grandular atypia.  There was no evidence of malignancy.  She attended chemotherapy education class on 07/04/2017.  Patient had port-a-cath placed on 07/14/2017.  PET scan on 07/17/2017 revealed hypermetabolic central left breast mass (SUV 8.3) compatible with malignancy.  There was subtle accentuated activity in a small left axillary lymph node, but below background blood pool activity.  The cervical prominence noted on recent CT was not appreciably hypermetabolic.  She was seen by Carolyn Roy on 07/19/2017.  Recommendation was for pelvic MRI after completion of chemotherapy for breast cancer.  During the interim, patient is doing well.  She denies acute concerns. Patient denies any B symptoms. She denies any interval infections. Patient maintains an adequate appetite, and notes that she is eating well.  Weight, compared to her last visit to the clinic, has remained stable.   Patient denies pain in the clinic today.   Past Medical History:  Diagnosis Date  . Cancer (HCC)    BREAST  . Depression   . Diabetes mellitus without complication ([place])   . Endometriosis   . Family history of breast cancer   . Hyperlipidemia   . Hypertension   . Ovarian mass   . Pneumonia     Past Surgical History:  Procedure Laterality Date  . AXILLARY LYMPH NODE  BIOPSY Left 07/14/2017   Procedure: INSERTION GEL MARK CLIP LEFT AXILLA;  Surgeon: Carolyn Bellow, MD;  Location: [facility] ORS;  Service: General;  Laterality: Left;  . OOPHORECTOMY    . PORTACATH PLACEMENT Right 07/14/2017   Procedure: INSERTION PORT-A-CATH;  Surgeon: Carolyn Bellow, MD;  Location: [facility] ORS;  Service: General;  Laterality: Right;  . TUBAL LIGATION      Family History  Problem Relation Age of Onset  . Other Father        No info about father or paternal relatives  . Diabetes Brother   . Pancreatitis Brother   . Prostate cancer Brother 66       currently 51 / maternal half-brother  . Breast cancer Maternal Grandmother 40       deceased 83s  . Breast cancer Maternal Aunt 65       currently 9  . Breast cancer Other 47       mother's sister; deceased 53  . Breast cancer Other        mother's sister; age at dx unknown    Social History:  reports that she has been smoking cigarettes.  She has a 5.50 pack-year smoking history. She has quit using smokeless tobacco. Her smokeless tobacco use included snuff. She reports that she does not drink alcohol or use drugs.  She smokes 0.5-1 ppd x 11 years. She works at [employer] in [place].  Patient denies known exposures to radiation on toxins. Her fiance is Carolyn Roy.  The patient is accompanied by the nurse navigators Carolyn Roy) and later by her fiance today.  Allergies:  Allergies  Allergen Reactions  . Aspirin Nausea And Vomiting    Current Medications: Current Outpatient Medications  Medication Sig Dispense Refill  . atorvastatin (LIPITOR) 40 MG tablet Take 1 tablet (40 mg total) by mouth daily. 90 tablet 3  . glucose blood (ONE TOUCH ULTRA TEST) test strip Use up to 4 times/day 100 each 12  . Insulin Glargine (BASAGLAR KWIKPEN) 100 UNIT/ML SOPN INJECT 0.24 MLS (24 UNITS TOTAL) INTO THE SKIN AT BEDTIME. 15 pen 2  . Insulin Pen Needle 32G X 4 MM MISC 1 Units by Does not apply route every  morning. Pen needles 90 each 3  . INVOKANA 100 MG TABS tablet TAKE 1 TABLET (100 MG TOTAL) BY MOUTH DAILY BEFORE BREAKFAST. 30 tablet 0  . lidocaine-prilocaine (EMLA) cream Apply to affected area once 30 g 3  . Melatonin 10 MG TABS Take 20 mg by mouth at bedtime as needed (sleep).    . metFORMIN (GLUCOPHAGE) 500 MG tablet TAKE 2 TABLETS (1,000 MG TOTAL) BY MOUTH 2 (TWO) TIMES DAILY WITH A MEAL. 120 tablet 1  . albuterol (PROVENTIL HFA;VENTOLIN HFA) 108 (90 Base) MCG/ACT inhaler Inhale 2 puffs into the lungs every 6 (six) hours as needed for wheezing or shortness of breath. (Patient not taking: Reported on 07/20/2017) 1 Inhaler 0  . ondansetron (ZOFRAN) 8 MG tablet Take 1 tablet (8 mg total) by mouth 2 (two) times daily as needed. Start on the third day after chemotherapy. (Patient not taking: Reported on 07/19/2017) 30 tablet 1  . prochlorperazine (COMPAZINE) 10 MG tablet Take 1 tablet (10 mg total) by mouth every 6 (six) hours as needed (Nausea or vomiting). (Patient not taking: Reported on 07/19/2017) 30 tablet 1  . venlafaxine XR (EFFEXOR-XR) 150 MG 24 hr capsule TAKE 1 CAPSULE (150 MG TOTAL) BY MOUTH DAILY WITH BREAKFAST. (Patient not taking: Reported on 07/12/2017) 90 capsule 1   No current facility-administered medications for this visit.    Facility-Administered Medications Ordered in Other Visits  Medication Dose Route Frequency Provider Last Rate Last Dose  . heparin lock flush 100 unit/mL  500 Units Intravenous Once Carolyn Asal, MD        Review of Systems  Constitutional: Positive for malaise/fatigue (improved). Negative for diaphoresis, fever and weight loss.  HENT: Negative.  Negative for congestion, hearing loss, nosebleeds, sinus pain and sore throat.   Eyes: Negative.  Negative for blurred vision, double vision, photophobia and redness.  Respiratory: Positive for cough (related to smoking) and shortness of breath (exertional). Negative for hemoptysis and sputum production.    Cardiovascular: Negative.  Negative for chest pain, palpitations, orthopnea, leg swelling and PND.  Gastrointestinal: Positive for constipation. Negative for abdominal pain, blood in stool, diarrhea, melena, nausea and vomiting.  Genitourinary: Negative.  Negative for dysuria, frequency, hematuria and urgency.  Musculoskeletal: Positive for joint pain (knee). Negative for back pain, falls and myalgias.  Skin: Negative.  Negative for itching and rash.       Skin dimpling - LEFT breast. Face and upper body is red from patient going to the tanning bed.   Neurological: Positive for headaches (h/o migraines). Negative for dizziness, tremors and weakness.  Endo/Heme/Allergies: Does not bruise/bleed easily.       T2DM; denies neuropathy  Psychiatric/Behavioral: Negative for depression, memory loss and suicidal ideas. The patient has insomnia. The patient is not nervous/anxious.   All other  systems reviewed and are negative.  Performance status (ECOG): 1 - Symptomatic but completely ambulatory  Physical Exam: Blood pressure 119/81, pulse 91, temperature 97.9 F (36.6 C), temperature source Tympanic, resp. rate 20, height _0  (1.651 m), weight 187 lb 14.4 oz (85.2 kg). GENERAL:  Well developed, well nourished, woman sitting comfortably in the exam room in no acute distress. MENTAL STATUS:  Alert and oriented to person, place and time. HEAD:  Long hair.  Normocephalic, atraumatic, face symmetric, no Cushingoid features. EYES:  Glasses.  Hazel eyes.  Pupils equal round and reactive to light and accomodation.  No conjunctivitis or scleral icterus. ENT:  Oropharynx clear without lesion.  Tongue normal. Mucous membranes moist.  RESPIRATORY:  Clear to auscultation without rales, wheezes or rhonchi. CARDIOVASCULAR:  Regular rate and rhythm without murmur, rub or gallop. CHEST:  Port-a-cath in place. ABDOMEN:  Soft, non-tender, with active bowel sounds, and no hepatosplenomegaly.  No masses. SKIN:  Tan.   No rashes, ulcers or lesions. EXTREMITIES: No edema, no skin discoloration or tenderness.  No palpable cords. LYMPH NODES: No palpable cervical, supraclavicular, axillary or inguinal adenopathy  NEUROLOGICAL: Unremarkable. PSYCH:  Appropriate.    Infusion on 07/20/2017  Component Date Value Ref Range Status  . Sodium 07/20/2017 137  135 - 145 mmol/L Final  . Potassium 07/20/2017 3.9  3.5 - 5.1 mmol/L Final  . Chloride 07/20/2017 103  101 - 111 mmol/L Final  . CO2 07/20/2017 24  22 - 32 mmol/L Final  . Glucose, Bld 07/20/2017 403* 65 - 99 mg/dL Final  . BUN 07/20/2017 10  6 - 20 mg/dL Final  . Creatinine, Ser 07/20/2017 0.55  0.44 - 1.00 mg/dL Final  . Calcium 07/20/2017 9.1  8.9 - 10.3 mg/dL Final  . Total Protein 07/20/2017 7.0  6.5 - 8.1 g/dL Final  . Albumin 07/20/2017 3.7  3.5 - 5.0 g/dL Final  . AST 07/20/2017 19  15 - 41 U/L Final  . ALT 07/20/2017 18  14 - 54 U/L Final  . Alkaline Phosphatase 07/20/2017 106  38 - 126 U/L Final  . Total Bilirubin 07/20/2017 0.5  0.3 - 1.2 mg/dL Final  . GFR calc non Af Amer 07/20/2017 >60  >60 mL/min Final  . GFR calc Af Amer 07/20/2017 >60  >60 mL/min Final   Comment: (NOTE) The eGFR has been calculated using the CKD EPI equation. This calculation has not been validated in all clinical situations. eGFR's persistently <60 mL/min signify possible Chronic Kidney Disease.   Carolyn Roy gap 07/20/2017 10  5 - 15 Final   Performed at [facility], [address] [place], [place] [address]  . WBC 07/20/2017 8.2  3.6 - 11.0 K/uL Final  . RBC 07/20/2017 4.74  3.80 - 5.20 MIL/uL Final  . Hemoglobin 07/20/2017 15.5  12.0 - 16.0 g/dL Final  . HCT 07/20/2017 44.6  35.0 - 47.0 % Final  . MCV 07/20/2017 94.1  80.0 - 100.0 fL Final  . MCH 07/20/2017 32.8  26.0 - 34.0 pg Final  . MCHC 07/20/2017 34.8  32.0 - 36.0 g/dL Final  . RDW 07/20/2017 13.4  11.5 - 14.5 % Final  . Platelets 07/20/2017 242  150 - 440 K/uL Final  . Neutrophils Relative %  07/20/2017 59  % Final  . Neutro Abs 07/20/2017 4.8  1.4 - 6.5 K/uL Final  . Lymphocytes Relative 07/20/2017 33  % Final  . Lymphs Abs 07/20/2017 2.7  1.0 - 3.6 K/uL Final  . Monocytes  Relative 07/20/2017 6  % Final  . Monocytes Absolute 07/20/2017 0.5  0.2 - 0.9 K/uL Final  . Eosinophils Relative 07/20/2017 1  % Final  . Eosinophils Absolute 07/20/2017 0.1  0 - 0.7 K/uL Final  . Basophils Relative 07/20/2017 1  % Final  . Basophils Absolute 07/20/2017 0.1  0 - 0.1 K/uL Final   Performed at [facility], [address] [place], [place] [address]  . Magnesium 07/20/2017 1.9  1.7 - 2.4 mg/dL Final   Performed at [facility], [address] [place], [place] [address]    Assessment:  ANSHI JALLOH is a 52 y.o. female with clinical stage IIIA (T3N1Mx) left breast cancer s/p biopsy on 06/14/2017.  Pathology revealed grade III invasive ductal carcinoma.  Axillary FNA revealed malignant cells c/w metastatic carcinoma. Tumor was ER + (90%), PR + (30%), Her2/neu - and Ki67 70%.  CA27.29 was 7.8 on 06/14/2017.  Bilateral mammogram on 06/12/2017 revealed a 3.5 cm irregular mass with associated architectural distortion within the retroareolar left breast centered at the 3-4 o'clock axis.  There was probable surrounding satellite masses extending to overall measurement of 5.5 cm.  There were no masses in the right breast.  Utrasound on 06/14/2017 revealed a 2.4 x 4.0 x 5.1 cm hypoechoic mass in the retroareolar area.  The mass extended to the pectoralis fascia.  There was a prominent lymph node measuring 1.06 x 1.32 x 1.33 cm with cortical thickening.   Echo on 06/28/2017 revealed an EF of 60-65%  Chest, abdomen, and pelvis CT on 06/29/2017 revealed a LEFT breast mass measuring approximately 3.7 x 4.4 x 4.2 cm. There were borderline enlarged LEFT axillary and left internal mammary lymph nodes.  There was an infiltrative heterogeneously enhancing mass in the cervical region extending into  the lower uterine segment that was concerning for a primary cervical carcinoma.  PET scan on 07/17/2017 revealed hypermetabolic central left breast mass (SUV 8.3) compatible with malignancy.  There was subtle accentuated activity in a small left axillary lymph node, but below background blood pool activity.  The cervical prominence noted on recent CT was not appreciably hypermetabolic.  Cervical biopsy on 07/05/2017 revealed an inflamed polyp with tubal metaplasia and focal grandular atypia.  There was no evidence of malignancy.  She has a family history of breast cancer.  She is premenopausal.  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   Symptomatically, she is doing well.  Exam is stable.  Blood sugar is 403.  Plan: 1. Labs today:  CBC with diff, CMP 2. Discuss results of recent cervical biopsy- inflamed polyp with tubal metaplasia and focal atypia.  No malignancy. 3. Discuss results of PET scan- hypermetabolic breast lesion.  No hypermetabolic cervical lesion. 4. Discuss plan to proceed with chemotherapy.  Side effects reviewed in detail.  Discuss antiemetic use at home. Discussed symptom management clinic [facility]) for acute symptoms rather than ED. Patient consented to begin treatment today as planned.  5. Rx:  Ativan 0.5 mg po q 6 hours prn nausea.  Patient cannot drive or operate heavy machinery.  She has an ondansetron Rx.  Discuss use of ondansetron prn 48 hours chemotherapy. 6. Labs reviewed. Blood counts stable and adequate enough for treatment. Will proceed with cycle #1 AC today. 7. RTC tomorrow for Neulasta. Discuss need to take loratadine for at least a week following injection  to help with associated bone pain.  8. Discuss HYPERglycemia. Glucose 403. Patient on Metformin 1000 mg BID, Invokana 100 mg daily, and Basaglar 24 units  qhs. Additionally, she has SSI insulin coverage, however patient is not using it as directed. Patient to receive dexamethasone IV today with treatment. Discussed need for strict CBG monitoring at home, with appropriate SSI coverage. Will send message to PCP Carolyn Haddock, NP) to discuss.  9. RTC in 1 week for MD assessment and labs (CBC with diff, BMP).  10. RTC in 2 weeks for MD assessment, labs (CBC with diff, CMP, Mg), and cycle #2 AC.   Carolyn Loh, NP  07/20/2017, 9:19 AM  I saw and evaluated the patient, participating in the key portions of the service and reviewing pertinent diagnostic studies and records.  I reviewed the nurse practitioner's note and agree with the findings and the plan.  The assessment and plan were discussed with the patient.  Multiple questions were asked by the patient and answered.    Carolyn Stalls, MD 07/20/2017,9:19 AM

## 2017-07-21 ENCOUNTER — Inpatient Hospital Stay: Payer: Managed Care, Other (non HMO)

## 2017-07-21 DIAGNOSIS — C50812 Malignant neoplasm of overlapping sites of left female breast: Secondary | ICD-10-CM

## 2017-07-21 DIAGNOSIS — Z5111 Encounter for antineoplastic chemotherapy: Secondary | ICD-10-CM | POA: Diagnosis not present

## 2017-07-21 MED ORDER — PEGFILGRASTIM INJECTION 6 MG/0.6ML ~~LOC~~
6.0000 mg | PREFILLED_SYRINGE | Freq: Once | SUBCUTANEOUS | Status: AC
  Administered 2017-07-21: 6 mg via SUBCUTANEOUS

## 2017-07-25 ENCOUNTER — Telehealth: Payer: Self-pay | Admitting: Urgent Care

## 2017-07-25 NOTE — Telephone Encounter (Signed)
Return call received from patient. She advises that she has been having trouble with her phone. We reached out to some extended family and had them have patient contact us and PCP.   Patient initially contact PCP's office and was advised that no one tried to call her. She called here confused. I spoke with her at length regarding her blood sugars. She notes that her sugars have been controlled; running between 130 and 150s. Patient continues on Metformin, Invokana, and Basaglar. She denies having to use SSI Novolog coverage. Patient states, "My blood sugars have been good and I am eating like a horse". Patient advised to keep a log and bring with her later this week for provider review.   Message sent to PCP's office. Julian Hy, NP out of town. I have been communicating with Dr. Wynetta Emery. We will be seeing patient in follow up clinic later this week, at which time routine labs will be obtained. Patient to receive pre-chemotherapy dexamethasone again, so I wanted PCP's office to be aware.  Honor Loh, MSN, APRN, FNP-C, CEN Oncology/Hematology Nurse Practitioner  [facility] 07/25/17 4:54 PM

## 2017-07-28 ENCOUNTER — Inpatient Hospital Stay: Payer: Managed Care, Other (non HMO)

## 2017-07-28 ENCOUNTER — Encounter: Payer: Self-pay | Admitting: Hematology and Oncology

## 2017-07-28 ENCOUNTER — Inpatient Hospital Stay (HOSPITAL_BASED_OUTPATIENT_CLINIC_OR_DEPARTMENT_OTHER): Payer: Managed Care, Other (non HMO) | Admitting: Hematology and Oncology

## 2017-07-28 VITALS — BP 103/66 | HR 90 | Temp 98.1°F | Resp 16 | Wt 193.5 lb

## 2017-07-28 DIAGNOSIS — C50812 Malignant neoplasm of overlapping sites of left female breast: Secondary | ICD-10-CM

## 2017-07-28 DIAGNOSIS — R739 Hyperglycemia, unspecified: Secondary | ICD-10-CM | POA: Diagnosis not present

## 2017-07-28 DIAGNOSIS — C50512 Malignant neoplasm of lower-outer quadrant of left female breast: Secondary | ICD-10-CM | POA: Diagnosis not present

## 2017-07-28 DIAGNOSIS — E119 Type 2 diabetes mellitus without complications: Secondary | ICD-10-CM | POA: Diagnosis not present

## 2017-07-28 DIAGNOSIS — T451X5A Adverse effect of antineoplastic and immunosuppressive drugs, initial encounter: Secondary | ICD-10-CM

## 2017-07-28 DIAGNOSIS — Z7984 Long term (current) use of oral hypoglycemic drugs: Secondary | ICD-10-CM

## 2017-07-28 DIAGNOSIS — D701 Agranulocytosis secondary to cancer chemotherapy: Secondary | ICD-10-CM | POA: Diagnosis not present

## 2017-07-28 DIAGNOSIS — Z5111 Encounter for antineoplastic chemotherapy: Secondary | ICD-10-CM | POA: Diagnosis not present

## 2017-07-28 DIAGNOSIS — Z794 Long term (current) use of insulin: Secondary | ICD-10-CM | POA: Diagnosis not present

## 2017-07-28 LAB — BASIC METABOLIC PANEL
Anion gap: 5 (ref 5–15)
BUN: 8 mg/dL (ref 6–20)
CO2: 25 mmol/L (ref 22–32)
Calcium: 8.9 mg/dL (ref 8.9–10.3)
Chloride: 107 mmol/L (ref 101–111)
Creatinine, Ser: 0.55 mg/dL (ref 0.44–1.00)
GFR calc Af Amer: >60 mL/min (ref >60)
GFR calc non Af Amer: >60 mL/min (ref >60)
Glucose, Bld: 363 mg/dL — ABNORMAL HIGH (ref 65–99)
Potassium: 4 mmol/L (ref 3.5–5.1)
Sodium: 137 mmol/L (ref 135–145)

## 2017-07-28 LAB — CBC WITH DIFFERENTIAL/PLATELET
Basophils Absolute: 0 10*3/uL (ref 0–0.1)
Basophils Relative: 2 %
Eosinophils Absolute: 0.1 10*3/uL (ref 0–0.7)
Eosinophils Relative: 5 %
HCT: 39.7 % (ref 35.0–47.0)
Hemoglobin: 13.4 g/dL (ref 12.0–16.0)
Lymphocytes Relative: 47 %
Lymphs Abs: 0.9 10*3/uL — ABNORMAL LOW (ref 1.0–3.6)
MCH: 32 pg (ref 26.0–34.0)
MCHC: 33.8 g/dL (ref 32.0–36.0)
MCV: 94.6 fL (ref 80.0–100.0)
Monocytes Absolute: 0.2 10*3/uL (ref 0.2–0.9)
Monocytes Relative: 9 %
Neutro Abs: 0.7 10*3/uL — ABNORMAL LOW (ref 1.4–6.5)
Neutrophils Relative %: 37 %
Platelets: 153 10*3/uL (ref 150–440)
RBC: 4.19 MIL/uL (ref 3.80–5.20)
RDW: 13 % (ref 11.5–14.5)
WBC: 2 10*3/uL — ABNORMAL LOW (ref 3.6–11.0)

## 2017-07-28 NOTE — Progress Notes (Addendum)
[facility] day:  07/28/2017  Chief Complaint: Carolyn Roy is a 52 y.o. female with clinical stage IIIA left breast cancer and cervical polyp who is seen for assessment on day 8 of cycle #1 AC.  HPI:  The patient was last seen in the medical oncology clinic on 07/20/2017.  At that time,  She received cycle #1 AC with Neulasta support.  We discussed concern for her poorly controlled diabetes (blood sugar was 403).  During the interim, patient notes that her initial cycle of chemotherapy was tolerated well. She had some nausea. She has not experienced any Neulasta associated bone pain. She used the loratadine as recommended.   Patient denies that she has experienced any B symptoms. She denies any interval infections. Patient maintains an adequate appetite, and notes that she is eating well. Weight, compared to her last visit to the clinic, has increased by 6 pounds.   Patient denies pain in the clinic today.   Past Medical History:  Diagnosis Date  . Cancer (HCC)    BREAST  . Depression   . Diabetes mellitus without complication ([place])   . Endometriosis   . Family history of breast cancer   . Hyperlipidemia   . Hypertension   . Ovarian mass   . Pneumonia     Past Surgical History:  Procedure Laterality Date  . AXILLARY LYMPH NODE BIOPSY Left 07/14/2017   Procedure: INSERTION GEL MARK CLIP LEFT AXILLA;  Surgeon: Robert Bellow, MD;  Location: [facility] ORS;  Service: General;  Laterality: Left;  . OOPHORECTOMY    . PORTACATH PLACEMENT Right 07/14/2017   Procedure: INSERTION PORT-A-CATH;  Surgeon: Robert Bellow, MD;  Location: [facility] ORS;  Service: General;  Laterality: Right;  . TUBAL LIGATION      Family History  Problem Relation Age of Onset  . Other Father        No info about father or paternal relatives  . Diabetes Brother   . Pancreatitis Brother   . Prostate cancer Brother 18       currently 24 / maternal half-brother   . Breast cancer Maternal Grandmother 40       deceased 51s  . Breast cancer Maternal Aunt 65       currently 26  . Breast cancer Other 61       mother's sister; deceased 43  . Breast cancer Other        mother's sister; age at dx unknown    Social History:  reports that she has been smoking cigarettes.  She has a 5.50 pack-year smoking history. She has quit using smokeless tobacco. Her smokeless tobacco use included snuff. She reports that she does not drink alcohol or use drugs.  She smokes 0.5-1 ppd x 11 years. She works at [employer] in [place].  Patient denies known exposures to radiation on toxins. Her fiance is Judee Clara.  The patient is alone today.  Allergies:  Allergies  Allergen Reactions  . Aspirin Nausea And Vomiting    Current Medications: Current Outpatient Medications  Medication Sig Dispense Refill  . albuterol (PROVENTIL HFA;VENTOLIN HFA) 108 (90 Base) MCG/ACT inhaler Inhale 2 puffs into the lungs every 6 (six) hours as needed for wheezing or shortness of breath. 1 Inhaler 0  . atorvastatin (LIPITOR) 40 MG tablet Take 1 tablet (40 mg total) by mouth daily. 90 tablet 3  . glucose blood (ONE TOUCH ULTRA TEST) test strip Use up  to 4 times/day 100 each 12  . Insulin Glargine (BASAGLAR KWIKPEN) 100 UNIT/ML SOPN INJECT 0.24 MLS (24 UNITS TOTAL) INTO THE SKIN AT BEDTIME. 15 pen 2  . Insulin Pen Needle 32G X 4 MM MISC 1 Units by Does not apply route every morning. Pen needles 90 each 3  . INVOKANA 100 MG TABS tablet TAKE 1 TABLET (100 MG TOTAL) BY MOUTH DAILY BEFORE BREAKFAST. 30 tablet 0  . lidocaine-prilocaine (EMLA) cream Apply to affected area once 30 g 3  . metFORMIN (GLUCOPHAGE) 500 MG tablet TAKE 2 TABLETS (1,000 MG TOTAL) BY MOUTH 2 (TWO) TIMES DAILY WITH A MEAL. 120 tablet 1  . Melatonin 10 MG TABS Take 20 mg by mouth at bedtime as needed (sleep).    . ondansetron (ZOFRAN) 8 MG tablet Take 1 tablet (8 mg total) by mouth 2 (two) times daily as  needed. Start on the third day after chemotherapy. (Patient not taking: Reported on 07/19/2017) 30 tablet 1  . prochlorperazine (COMPAZINE) 10 MG tablet Take 1 tablet (10 mg total) by mouth every 6 (six) hours as needed (Nausea or vomiting). (Patient not taking: Reported on 07/19/2017) 30 tablet 1  . venlafaxine XR (EFFEXOR-XR) 150 MG 24 hr capsule TAKE 1 CAPSULE (150 MG TOTAL) BY MOUTH DAILY WITH BREAKFAST. (Patient not taking: Reported on 07/12/2017) 90 capsule 1   No current facility-administered medications for this visit.     Review of Systems  Constitutional: Positive for malaise/fatigue (improved). Negative for diaphoresis, fever and weight loss (weight up by 6 pounds).       "I feel good. I did good with the chemo".   HENT: Negative.  Negative for congestion, hearing loss, nosebleeds, sinus pain and sore throat.   Eyes: Negative.  Negative for blurred vision, double vision, photophobia and redness.  Respiratory: Positive for cough (related to smoking) and shortness of breath (exertional). Negative for hemoptysis and sputum production.   Cardiovascular: Negative.  Negative for chest pain, palpitations, orthopnea, leg swelling and PND.  Gastrointestinal: Positive for constipation. Negative for abdominal pain, blood in stool, diarrhea, melena, nausea and vomiting.  Genitourinary: Negative.  Negative for dysuria, frequency, hematuria and urgency.  Musculoskeletal: Positive for joint pain (knee). Negative for back pain, falls and myalgias.  Skin: Negative.  Negative for itching and rash.       Skin dimpling - LEFT breast. Goes to the tanning bed.   Neurological: Positive for headaches (h/o migraines). Negative for dizziness, tremors and weakness.  Endo/Heme/Allergies: Does not bruise/bleed easily.       T2DM - poorly controlled. Highest blood sugar at home was 582.  Denies neuropathy  Psychiatric/Behavioral: Negative for depression, memory loss and suicidal ideas. The patient has insomnia. The  patient is not nervous/anxious.   All other systems reviewed and are negative.  Performance status (ECOG): 1 - Symptomatic but completely ambulatory  Physical Exam: Blood pressure 103/66, pulse 90, temperature 98.1 F (36.7 C), temperature source Tympanic, resp. rate 16, weight 193 lb 8 oz (87.8 kg). GENERAL:  Well developed, well nourished, woman sitting comfortably in the exam room in no acute distress. MENTAL STATUS:  Alert and oriented to person, place and time. HEAD:  Long brown/auburn hair.  Normocephalic, atraumatic, face symmetric, no Cushingoid features. EYES:  Hazel eyes.  Pupils equal round and reactive to light and accomodation.  No conjunctivitis or scleral icterus. ENT:  Oropharynx clear without lesion.  Tongue normal. Mucous membranes moist.  RESPIRATORY:  Clear to auscultation without rales, wheezes or rhonchi.  CARDIOVASCULAR:  Regular rate and rhythm without murmur, rub or gallop. ABDOMEN:  Soft, non-tender, with active bowel sounds, and no hepatosplenomegaly.  No masses. SKIN:  Tan.  No rashes, ulcers or lesions. EXTREMITIES: No edema, skin discoloration or tenderness.  No palpable cords. LYMPH NODES: No palpable cervical, supraclavicular, axillary or inguinal adenopathy  NEUROLOGICAL: Unremarkable. PSYCH:  Appropriate.    Appointment on 07/28/2017  Component Date Value Ref Range Status  . Sodium 07/28/2017 137  135 - 145 mmol/L Final  . Potassium 07/28/2017 4.0  3.5 - 5.1 mmol/L Final  . Chloride 07/28/2017 107  101 - 111 mmol/L Final  . CO2 07/28/2017 25  22 - 32 mmol/L Final  . Glucose, Bld 07/28/2017 363* 65 - 99 mg/dL Final  . BUN 07/28/2017 8  6 - 20 mg/dL Final  . Creatinine, Ser 07/28/2017 0.55  0.44 - 1.00 mg/dL Final  . Calcium 07/28/2017 8.9  8.9 - 10.3 mg/dL Final  . GFR calc non Af Amer 07/28/2017 >60  >60 mL/min Final  . GFR calc Af Amer 07/28/2017 >60  >60 mL/min Final   Comment: (NOTE) The eGFR has been calculated using the CKD EPI equation. This  calculation has not been validated in all clinical situations. eGFR's persistently <60 mL/min signify possible Chronic Kidney Disease.   Carolyn Roy 07/28/2017 5  5 - 15 Final   Performed at [facility], [address] [place], [place] [address]  . WBC 07/28/2017 2.0* 3.6 - 11.0 K/uL Final  . RBC 07/28/2017 4.19  3.80 - 5.20 MIL/uL Final  . Hemoglobin 07/28/2017 13.4  12.0 - 16.0 g/dL Final  . HCT 07/28/2017 39.7  35.0 - 47.0 % Final  . MCV 07/28/2017 94.6  80.0 - 100.0 fL Final  . MCH 07/28/2017 32.0  26.0 - 34.0 pg Final  . MCHC 07/28/2017 33.8  32.0 - 36.0 g/dL Final  . RDW 07/28/2017 13.0  11.5 - 14.5 % Final  . Platelets 07/28/2017 153  150 - 440 K/uL Final  . Neutrophils Relative % 07/28/2017 37  % Final  . Neutro Abs 07/28/2017 0.7* 1.4 - 6.5 K/uL Final  . Lymphocytes Relative 07/28/2017 47  % Final  . Lymphs Abs 07/28/2017 0.9* 1.0 - 3.6 K/uL Final  . Monocytes Relative 07/28/2017 9  % Final  . Monocytes Absolute 07/28/2017 0.2  0.2 - 0.9 K/uL Final  . Eosinophils Relative 07/28/2017 5  % Final  . Eosinophils Absolute 07/28/2017 0.1  0 - 0.7 K/uL Final  . Basophils Relative 07/28/2017 2  % Final  . Basophils Absolute 07/28/2017 0.0  0 - 0.1 K/uL Final   Performed at [facility], [address] [place], [place] [address]    Assessment:  Carolyn Roy is a 52 y.o. female with clinical stage IIIA (T3N1Mx) left breast cancer s/p biopsy on 06/14/2017.  Pathology revealed grade III invasive ductal carcinoma.  Axillary FNA revealed malignant cells c/w metastatic carcinoma. Tumor was ER + (90%), PR + (30%), Her2/neu - and Ki67 70%.  CA27.29 was 7.8 on 06/14/2017.  Bilateral mammogram on 06/12/2017 revealed a 3.5 cm irregular mass with associated architectural distortion within the retroareolar left breast centered at the 3-4 o'clock axis.  There was probable surrounding satellite masses extending to overall measurement of 5.5 cm.  There were no masses in the right  breast.  Utrasound on 06/14/2017 revealed a 2.4 x 4.0 x 5.1 cm hypoechoic mass in the retroareolar area.  The mass extended to the pectoralis  fascia.  There was a prominent lymph node measuring 1.06 x 1.32 x 1.33 cm with cortical thickening.   Echo on 06/28/2017 revealed an EF of 60-65%  Chest, abdomen, and pelvis CT on 06/29/2017 revealed a LEFT breast mass measuring approximately 3.7 x 4.4 x 4.2 cm. There were borderline enlarged LEFT axillary and left internal mammary lymph nodes.  There was an infiltrative heterogeneously enhancing mass in the cervical region extending into the lower uterine segment that was concerning for a primary cervical carcinoma.  PET scan on 07/17/2017 revealed hypermetabolic central left breast mass (SUV 8.3) compatible with malignancy.  There was subtle accentuated activity in a small left axillary lymph node, but below background blood pool activity.  The cervical prominence noted on recent CT was not appreciably hypermetabolic.  Cervical biopsy on 07/05/2017 revealed an inflamed polyp with tubal metaplasia and focal grandular atypia.  There was no evidence of malignancy.  She is day 8 s/p cycle #1 AC with Neulasta support (07/20/2017).  She has a family history of breast cancer.  She is premenopausal.  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   Symptomatically, she is doing well.  Exam is stable.  ANC is 700.  Blood sugar is 363.  Plan: 1. Labs today:  CBC with diff, BMP. 2. Discuss HYPERglycemia. Glucose 363. Patient on Metformin 1000 mg BID, Invokana 100 mg daily, and Basaglar 24 units qhs. Additionally, she has SSI insulin coverage, however patient is not using it as directed. Patient to receive dexamethasone IV with treatments. Discussed need for strict CBG monitoring at home, with appropriate SSI  coverage. Ongoing discussion with PCP.   3. Review neutropenic precautions.  4. RTC in 1 week for MD assessment, labs (CBC with diff, CMP, Mg), and cycle #2 AC.   Honor Loh, NP  07/28/2017, 3:43 PM  I saw and evaluated the patient, participating in the key portions of the service and reviewing pertinent diagnostic studies and records.  I reviewed the nurse practitioner's note and agree with the findings and the plan.  The assessment and plan were discussed with the patient.  A few questions were asked by the patient and answered.    Nolon Stalls, MD 07/28/2017,3:43 PM

## 2017-07-29 DIAGNOSIS — T451X5A Adverse effect of antineoplastic and immunosuppressive drugs, initial encounter: Secondary | ICD-10-CM | POA: Insufficient documentation

## 2017-07-29 DIAGNOSIS — D701 Agranulocytosis secondary to cancer chemotherapy: Secondary | ICD-10-CM | POA: Insufficient documentation

## 2017-08-02 NOTE — Progress Notes (Signed)
[facility] day:  08/03/2017  Chief Complaint: Carolyn Roy is a 52 y.o. female with clinical stage IIIA left breast cancer and cervical polyp who is seen for assessment on day 1 of cycle #2 AC.  HPI:  The patient was last seen in the medical oncology clinic on 07/28/2017.  At that time, she was day 8 of cycle #1 AC.  She was doing well.  She denied B symptoms and interval infections.  She had tolerated her initial chemotherapy treatment well.  She denied nausea, vomiting, or changes to her bowel habits.  Exam was stable.  [place] 700.  Blood sugar 363.   In the interim, patient is doing well. She is tolerating treatment well thus far. Patient denies nausea and vomiting. She is trying to work on her diet to better control her blood sugars. She is eating less simple carbohydrates. Blood sugars have improved to 120-180s.   Patient denies that she has experienced any B symptoms. She denies any interval infections. There is some concern that her port had "flipped". Multiple attempts to access her port in clinic proved to be unsuccessful.   Patient advises that she maintains an adequate appetite. She is eating well. Weight today is 189 lb (85.7 kg), which compared to her last visit to the clinic, represents a  5 pound weight loss.  Patient denies pain in the clinic today.   Past Medical History:  Diagnosis Date  . Cancer (HCC)    BREAST  . Depression   . Diabetes mellitus without complication ([place])   . Endometriosis   . Family history of breast cancer   . Hyperlipidemia   . Hypertension   . Ovarian mass   . Pneumonia     Past Surgical History:  Procedure Laterality Date  . AXILLARY LYMPH NODE BIOPSY Left 07/14/2017   Procedure: INSERTION GEL MARK CLIP LEFT AXILLA;  Surgeon: Robert Bellow, MD;  Location: [facility] ORS;  Service: General;  Laterality: Left;  . OOPHORECTOMY    . PORTACATH PLACEMENT Right 07/14/2017   Procedure: INSERTION  PORT-A-CATH;  Surgeon: Robert Bellow, MD;  Location: [facility] ORS;  Service: General;  Laterality: Right;  . TUBAL LIGATION      Family History  Problem Relation Age of Onset  . Other Father        No info about father or paternal relatives  . Diabetes Brother   . Pancreatitis Brother   . Prostate cancer Brother 13       currently 64 / maternal half-brother  . Breast cancer Maternal Grandmother 40       deceased 22s  . Breast cancer Maternal Aunt 65       currently 21  . Breast cancer Other 55       mother's sister; deceased 20  . Breast cancer Other        mother's sister; age at dx unknown    Social History:  reports that she has been smoking cigarettes.  She has a 5.50 pack-year smoking history. She has quit using smokeless tobacco. Her smokeless tobacco use included snuff. She reports that she does not drink alcohol or use drugs.  She smokes 0.5-1 ppd x 11 years. She works at [employer] in [place].  Patient denies known exposures to radiation on toxins. Her fiance is Judee Clara.  The patient is alone today.  Allergies:  Allergies  Allergen Reactions  . Aspirin Nausea And Vomiting  Current Medications: Current Outpatient Medications  Medication Sig Dispense Refill  . albuterol (PROVENTIL HFA;VENTOLIN HFA) 108 (90 Base) MCG/ACT inhaler Inhale 2 puffs into the lungs every 6 (six) hours as needed for wheezing or shortness of breath. 1 Inhaler 0  . atorvastatin (LIPITOR) 40 MG tablet Take 1 tablet (40 mg total) by mouth daily. 90 tablet 3  . glucose blood (ONE TOUCH ULTRA TEST) test strip Use up to 4 times/day 100 each 12  . Insulin Glargine (BASAGLAR KWIKPEN) 100 UNIT/ML SOPN INJECT 0.24 MLS (24 UNITS TOTAL) INTO THE SKIN AT BEDTIME. 15 pen 2  . Insulin Pen Needle 32G X 4 MM MISC 1 Units by Does not apply route every morning. Pen needles 90 each 3  . INVOKANA 100 MG TABS tablet TAKE 1 TABLET (100 MG TOTAL) BY MOUTH DAILY BEFORE BREAKFAST. 30 tablet 0  .  lidocaine-prilocaine (EMLA) cream Apply to affected area once 30 g 3  . Melatonin 10 MG TABS Take 20 mg by mouth at bedtime as needed (sleep).    . metFORMIN (GLUCOPHAGE) 500 MG tablet TAKE 2 TABLETS (1,000 MG TOTAL) BY MOUTH 2 (TWO) TIMES DAILY WITH A MEAL. 120 tablet 1  . ondansetron (ZOFRAN) 8 MG tablet Take 1 tablet (8 mg total) by mouth 2 (two) times daily as needed. Start on the third day after chemotherapy. (Patient not taking: Reported on 07/19/2017) 30 tablet 1  . prochlorperazine (COMPAZINE) 10 MG tablet Take 1 tablet (10 mg total) by mouth every 6 (six) hours as needed (Nausea or vomiting). (Patient not taking: Reported on 07/19/2017) 30 tablet 1  . venlafaxine XR (EFFEXOR-XR) 150 MG 24 hr capsule TAKE 1 CAPSULE (150 MG TOTAL) BY MOUTH DAILY WITH BREAKFAST. (Patient not taking: Reported on 08/03/2017) 90 capsule 1   No current facility-administered medications for this visit.    Facility-Administered Medications Ordered in Other Visits  Medication Dose Route Frequency Provider Last Rate Last Dose  . heparin lock flush 100 unit/mL  500 Units Intravenous Once Lexis Potenza C, MD      . sodium chloride flush (NS) 0.9 % injection 10 mL  10 mL Intravenous PRN Lequita Asal, MD        Review of Systems  Constitutional: Positive for malaise/fatigue and weight loss (down 4 pounds). Negative for diaphoresis and fever.  HENT: Negative.   Eyes: Negative.   Respiratory: Positive for cough (smoker) and shortness of breath (exertional). Negative for hemoptysis and sputum production.   Cardiovascular: Negative for chest pain, palpitations, orthopnea, leg swelling and PND.  Gastrointestinal: Negative for abdominal pain, blood in stool, constipation, diarrhea, melena, nausea and vomiting.  Genitourinary: Negative for dysuria, frequency, hematuria and urgency.  Musculoskeletal: Positive for joint pain (knees). Negative for back pain, falls and myalgias.  Skin: Negative for itching and rash.        Skin dimpling - LEFT breast. Skin red due to tanning bed use.   Neurological: Positive for headaches (migraines). Negative for dizziness, tremors and weakness.  Endo/Heme/Allergies: Does not bruise/bleed easily.       T2DM - better controlled. Denies neuropathy.   Psychiatric/Behavioral: Negative for depression, memory loss and suicidal ideas. The patient has insomnia. The patient is not nervous/anxious.   All other systems reviewed and are negative.  Performance status (ECOG): 1 - Symptomatic but completely ambulatory   Physical Exam: Blood pressure 108/73, pulse 84, temperature (!) 97 F (36.1 C), temperature source Tympanic, weight 189 lb (85.7 kg). GENERAL:  Well developed, well nourished, woman  sitting comfortably in the exam room in no acute distress. MENTAL STATUS:  Alert and oriented to person, place and time. HEAD:  Brown/auburn hair.  Normocephalic, atraumatic, face symmetric, no Cushingoid features. EYES:  Hazel eyes.  Pupils equal round and reactive to light and accomodation.  No conjunctivitis or scleral icterus. ENT:  Oropharynx clear without lesion.  Tongue normal. Mucous membranes moist.  RESPIRATORY:  Clear to auscultation without rales, wheezes or rhonchi. CARDIOVASCULAR:  Regular rate and rhythm without murmur, rub or gallop. ABDOMEN:  Soft, non-tender, with active bowel sounds, and no hepatosplenomegaly.  No masses. SKIN:  No rashes, ulcers or lesions. EXTREMITIES: No edema, no skin discoloration or tenderness.  No palpable cords. LYMPH NODES: No palpable cervical, supraclavicular, axillary or inguinal adenopathy  NEUROLOGICAL: Unremarkable. PSYCH:  Appropriate.    Infusion on 08/03/2017  Component Date Value Ref Range Status  . WBC 08/03/2017 9.6  3.6 - 11.0 K/uL Final  . RBC 08/03/2017 4.70  3.80 - 5.20 MIL/uL Final  . Hemoglobin 08/03/2017 15.3  12.0 - 16.0 g/dL Final  . HCT 08/03/2017 43.9  35.0 - 47.0 % Final  . MCV 08/03/2017 93.4  80.0 - 100.0 fL  Final  . MCH 08/03/2017 32.6  26.0 - 34.0 pg Final  . MCHC 08/03/2017 34.9  32.0 - 36.0 g/dL Final  . RDW 08/03/2017 13.8  11.5 - 14.5 % Final  . Platelets 08/03/2017 186  150 - 440 K/uL Final  . Neutrophils Relative % 08/03/2017 75  % Final  . Neutro Abs 08/03/2017 7.3* 1.4 - 6.5 K/uL Final  . Lymphocytes Relative 08/03/2017 19  % Final  . Lymphs Abs 08/03/2017 1.8  1.0 - 3.6 K/uL Final  . Monocytes Relative 08/03/2017 5  % Final  . Monocytes Absolute 08/03/2017 0.5  0.2 - 0.9 K/uL Final  . Eosinophils Relative 08/03/2017 0  % Final  . Eosinophils Absolute 08/03/2017 0.0  0 - 0.7 K/uL Final  . Basophils Relative 08/03/2017 1  % Final  . Basophils Absolute 08/03/2017 0.1  0 - 0.1 K/uL Final   Performed at [facility], [address] [place], [place] [address]  . Magnesium 08/03/2017 2.0  1.7 - 2.4 mg/dL Final   Performed at [facility], [address] [place], [place] [address]  . Sodium 08/03/2017 138  135 - 145 mmol/L Final  . Potassium 08/03/2017 4.0  3.5 - 5.1 mmol/L Final  . Chloride 08/03/2017 102  98 - 111 mmol/L Final   Please note change in reference range.  . CO2 08/03/2017 26  22 - 32 mmol/L Final  . Glucose, Bld 08/03/2017 187* 70 - 99 mg/dL Final   Please note change in reference range.  . BUN 08/03/2017 11  6 - 20 mg/dL Final   Please note change in reference range.  . Creatinine, Ser 08/03/2017 0.53  0.44 - 1.00 mg/dL Final  . Calcium 08/03/2017 8.9  8.9 - 10.3 mg/dL Final  . Total Protein 08/03/2017 6.9  6.5 - 8.1 g/dL Final  . Albumin 08/03/2017 3.7  3.5 - 5.0 g/dL Final  . AST 08/03/2017 14* 15 - 41 U/L Final  . ALT 08/03/2017 18  0 - 44 U/L Final   Please note change in reference range.  . Alkaline Phosphatase 08/03/2017 100  38 - 126 U/L Final  . Total Bilirubin 08/03/2017 0.6  0.3 - 1.2 mg/dL Final  . GFR calc non Af Amer 08/03/2017 >60  >60 mL/min Final  . GFR calc Af  Amer 08/03/2017 >60  >60 mL/min Final   Comment: (NOTE) The eGFR has  been calculated using the CKD EPI equation. This calculation has not been validated in all clinical situations. eGFR's persistently <60 mL/min signify possible Chronic Kidney Disease.   Carolyn Roy gap 08/03/2017 10  5 - 15 Final   Performed at [facility], [address] [place], [place] [address]    Assessment:  BRENTNEY GOLDBACH is a 52 y.o. female with clinical stage IIIA (T3N1Mx) left breast cancer s/p biopsy on 06/14/2017.  Pathology revealed grade III invasive ductal carcinoma.  Axillary FNA revealed malignant cells c/w metastatic carcinoma. Tumor was ER + (90%), PR + (30%), Her2/neu - and Ki67 70%.  CA27.29 was 7.8 on 06/14/2017.  Bilateral mammogram on 06/12/2017 revealed a 3.5 cm irregular mass with associated architectural distortion within the retroareolar left breast centered at the 3-4 o'clock axis.  There was probable surrounding satellite masses extending to overall measurement of 5.5 cm.  There were no masses in the right breast.  Utrasound on 06/14/2017 revealed a 2.4 x 4.0 x 5.1 cm hypoechoic mass in the retroareolar area.  The mass extended to the pectoralis fascia.  There was a prominent lymph node measuring 1.06 x 1.32 x 1.33 cm with cortical thickening.   Echo on 06/28/2017 revealed an EF of 60-65%  Chest, abdomen, and pelvis CT on 06/29/2017 revealed a LEFT breast mass measuring approximately 3.7 x 4.4 x 4.2 cm. There were borderline enlarged LEFT axillary and left internal mammary lymph nodes.  There was an infiltrative heterogeneously enhancing mass in the cervical region extending into the lower uterine segment that was concerning for a primary cervical carcinoma.  PET scan on 07/17/2017 revealed hypermetabolic central left breast mass (SUV 8.3) compatible with malignancy.  There was subtle accentuated activity in a small left axillary lymph node, but below background blood pool activity.  The cervical prominence noted on recent CT was not appreciably  hypermetabolic.  Cervical biopsy on 07/05/2017 revealed an inflamed polyp with tubal metaplasia and focal grandular atypia.  There was no evidence of malignancy.  She is s/p cycle #1 AC with Neulasta support (07/20/2017).  She has a family history of breast cancer.  She is premenopausal.  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   Symptomatically, she is doing well.  She has tolerated treatment well thus far. She denies nausea and vomiting. Questionable issues with port. Exam is stable.  ANC is 7300.  Blood sugar is 187.  Plan: 1. Labs today:  CBC with diff, CMP, Mg 2. Labs reviewed. Blood counts stable and adequate enough for treatment. Will proceed with cycle #2 AC today with Neulasta support tomorrow.  3. Discuss port concerns. Sent to surgery for evaluation. Port accessed without incident; good blood return. Patient coming back for treatment.  4. Discuss HYPERglycemia (improving). Glucose 187. Patient remains on Metformin 1000 mg BID, Invokana 100 mg daily, and Basaglar 24 units qhs. Additionally, she has SSI insulin coverage. Patient to receive dexamethasone IV with treatments. Discussed need for strict CBG monitoring at home, with appropriate SSI coverage. Ongoing discussion with PCP.   5. Review neutropenic precautions.  6. RTC in 2 weeks for MD assessment, labs (CBC with diff, CMP, Mg), of cycle #3 AC.   Honor Loh, NP  08/03/2017, 9:26 AM  I saw and evaluated the patient, participating in the key  portions of the service and reviewing pertinent diagnostic studies and records.  I reviewed the nurse practitioner's note and agree with the findings and the plan.  The assessment and plan were discussed with the patient.  A few questions were asked by the patient and answered.    Nolon Stalls, MD 08/03/2017,9:26 AM

## 2017-08-03 ENCOUNTER — Inpatient Hospital Stay: Payer: Managed Care, Other (non HMO)

## 2017-08-03 ENCOUNTER — Ambulatory Visit (INDEPENDENT_AMBULATORY_CARE_PROVIDER_SITE_OTHER): Payer: Managed Care, Other (non HMO) | Admitting: General Surgery

## 2017-08-03 ENCOUNTER — Inpatient Hospital Stay (HOSPITAL_BASED_OUTPATIENT_CLINIC_OR_DEPARTMENT_OTHER): Payer: Managed Care, Other (non HMO) | Admitting: Hematology and Oncology

## 2017-08-03 ENCOUNTER — Encounter: Payer: Self-pay | Admitting: Hematology and Oncology

## 2017-08-03 ENCOUNTER — Encounter: Payer: Self-pay | Admitting: General Surgery

## 2017-08-03 ENCOUNTER — Encounter: Payer: Self-pay | Admitting: *Deleted

## 2017-08-03 VITALS — BP 108/73 | HR 84 | Temp 97.0°F | Wt 189.0 lb

## 2017-08-03 VITALS — BP 108/73 | HR 74 | Resp 13 | Ht 65.0 in | Wt 188.0 lb

## 2017-08-03 DIAGNOSIS — C50812 Malignant neoplasm of overlapping sites of left female breast: Secondary | ICD-10-CM

## 2017-08-03 DIAGNOSIS — E119 Type 2 diabetes mellitus without complications: Secondary | ICD-10-CM | POA: Diagnosis not present

## 2017-08-03 DIAGNOSIS — Z5111 Encounter for antineoplastic chemotherapy: Secondary | ICD-10-CM | POA: Diagnosis not present

## 2017-08-03 DIAGNOSIS — C50512 Malignant neoplasm of lower-outer quadrant of left female breast: Secondary | ICD-10-CM

## 2017-08-03 DIAGNOSIS — Z170 Estrogen receptor positive status [ER+]: Secondary | ICD-10-CM | POA: Diagnosis not present

## 2017-08-03 DIAGNOSIS — R739 Hyperglycemia, unspecified: Secondary | ICD-10-CM | POA: Diagnosis not present

## 2017-08-03 DIAGNOSIS — Z7984 Long term (current) use of oral hypoglycemic drugs: Secondary | ICD-10-CM

## 2017-08-03 DIAGNOSIS — Z794 Long term (current) use of insulin: Secondary | ICD-10-CM

## 2017-08-03 LAB — COMPREHENSIVE METABOLIC PANEL
ALT: 18 U/L (ref 0–44)
AST: 14 U/L — ABNORMAL LOW (ref 15–41)
Albumin: 3.7 g/dL (ref 3.5–5.0)
Alkaline Phosphatase: 100 U/L (ref 38–126)
Anion gap: 10 (ref 5–15)
BUN: 11 mg/dL (ref 6–20)
CO2: 26 mmol/L (ref 22–32)
Calcium: 8.9 mg/dL (ref 8.9–10.3)
Chloride: 102 mmol/L (ref 98–111)
Creatinine, Ser: 0.53 mg/dL (ref 0.44–1.00)
GFR calc Af Amer: >60 mL/min (ref >60)
GFR calc non Af Amer: >60 mL/min (ref >60)
Glucose, Bld: 187 mg/dL — ABNORMAL HIGH (ref 70–99)
Potassium: 4 mmol/L (ref 3.5–5.1)
Sodium: 138 mmol/L (ref 135–145)
Total Bilirubin: 0.6 mg/dL (ref 0.3–1.2)
Total Protein: 6.9 g/dL (ref 6.5–8.1)

## 2017-08-03 LAB — CBC WITH DIFFERENTIAL/PLATELET
Basophils Absolute: 0.1 10*3/uL (ref 0–0.1)
Basophils Relative: 1 %
Eosinophils Absolute: 0 10*3/uL (ref 0–0.7)
Eosinophils Relative: 0 %
HCT: 43.9 % (ref 35.0–47.0)
Hemoglobin: 15.3 g/dL (ref 12.0–16.0)
Lymphocytes Relative: 19 %
Lymphs Abs: 1.8 10*3/uL (ref 1.0–3.6)
MCH: 32.6 pg (ref 26.0–34.0)
MCHC: 34.9 g/dL (ref 32.0–36.0)
MCV: 93.4 fL (ref 80.0–100.0)
Monocytes Absolute: 0.5 10*3/uL (ref 0.2–0.9)
Monocytes Relative: 5 %
Neutro Abs: 7.3 10*3/uL — ABNORMAL HIGH (ref 1.4–6.5)
Neutrophils Relative %: 75 %
Platelets: 186 10*3/uL (ref 150–440)
RBC: 4.7 MIL/uL (ref 3.80–5.20)
RDW: 13.8 % (ref 11.5–14.5)
WBC: 9.6 10*3/uL (ref 3.6–11.0)

## 2017-08-03 LAB — MAGNESIUM: Magnesium: 2 mg/dL (ref 1.7–2.4)

## 2017-08-03 MED ORDER — SODIUM CHLORIDE 0.9 % IV SOLN
Freq: Once | INTRAVENOUS | Status: AC
  Administered 2017-08-03: 11:00:00 via INTRAVENOUS
  Filled 2017-08-03: qty 5

## 2017-08-03 MED ORDER — SODIUM CHLORIDE 0.9 % IV SOLN
Freq: Once | INTRAVENOUS | Status: AC
  Administered 2017-08-03: 11:00:00 via INTRAVENOUS
  Filled 2017-08-03: qty 1000

## 2017-08-03 MED ORDER — SODIUM CHLORIDE 0.9 % IV SOLN
600.0000 mg/m2 | Freq: Once | INTRAVENOUS | Status: AC
  Administered 2017-08-03: 1220 mg via INTRAVENOUS
  Filled 2017-08-03: qty 50

## 2017-08-03 MED ORDER — HEPARIN SOD (PORK) LOCK FLUSH 100 UNIT/ML IV SOLN
500.0000 [IU] | Freq: Once | INTRAVENOUS | Status: AC
  Administered 2017-08-03: 500 [IU] via INTRAVENOUS
  Filled 2017-08-03: qty 5

## 2017-08-03 MED ORDER — SODIUM CHLORIDE 0.9% FLUSH
10.0000 mL | INTRAVENOUS | Status: DC | PRN
  Filled 2017-08-03: qty 10

## 2017-08-03 MED ORDER — PALONOSETRON HCL INJECTION 0.25 MG/5ML
0.2500 mg | Freq: Once | INTRAVENOUS | Status: AC
  Administered 2017-08-03: 0.25 mg via INTRAVENOUS
  Filled 2017-08-03: qty 5

## 2017-08-03 MED ORDER — DOXORUBICIN HCL CHEMO IV INJECTION 2 MG/ML
60.0000 mg/m2 | Freq: Once | INTRAVENOUS | Status: AC
  Administered 2017-08-03: 122 mg via INTRAVENOUS
  Filled 2017-08-03: qty 61

## 2017-08-03 NOTE — Progress Notes (Signed)
Patient states she was told in the lab that her port has flipped.  She had to have her labs drawn peripherally today.  Patient received adriamycin and cytoxan.

## 2017-08-03 NOTE — Progress Notes (Signed)
Patient ID: Carolyn Roy, female   DOB: [birth year], 52 y.o.   MRN: [id number]  Chief Complaint  Patient presents with  . Follow-up    Carolyn Roy is a 52 y.o. female here today for a port assessment.  Nursing staff unable to access during her second chemotherapy session.  Patient reports no difficulty with first chemotherapy session. Carolyn  Past Medical History:  Diagnosis Date  . Cancer (HCC)    BREAST  . Depression   . Diabetes mellitus without complication ([place])   . Endometriosis   . Family history of breast cancer   . Hyperlipidemia   . Hypertension   . Ovarian mass   . Pneumonia     Past Surgical History:  Procedure Laterality Date  . AXILLARY LYMPH NODE BIOPSY Left 07/14/2017   Procedure: INSERTION GEL MARK CLIP LEFT AXILLA;  Surgeon: Robert Bellow, MD;  Location: [facility] ORS;  Service: General;  Laterality: Left;  . OOPHORECTOMY    . PORTACATH PLACEMENT Right 07/14/2017   Procedure: INSERTION PORT-A-CATH;  Surgeon: Robert Bellow, MD;  Location: [facility] ORS;  Service: General;  Laterality: Right;  . TUBAL LIGATION      Family History  Problem Relation Age of Onset  . Other Father        No info about father or paternal relatives  . Diabetes Brother   . Pancreatitis Brother   . Prostate cancer Brother 11       currently 59 / maternal half-brother  . Breast cancer Maternal Grandmother 40       deceased 9s  . Breast cancer Maternal Aunt 65       currently 100  . Breast cancer Other 78       mother's sister; deceased 16  . Breast cancer Other        mother's sister; age at dx unknown    Social History Social History   Tobacco Use  . Smoking status: Current Every Day Smoker    Packs/day: 0.50    Years: 11.00    Pack years: 5.50    Types: Cigarettes  . Smokeless tobacco: Former Systems developer    Types: Snuff  Substance Use Topics  . Alcohol use: No    Alcohol/week: 0.0 oz  . Drug use: No    Allergies  Allergen Reactions  . Aspirin Nausea And  Vomiting    Current Outpatient Medications  Medication Sig Dispense Refill  . albuterol (PROVENTIL HFA;VENTOLIN HFA) 108 (90 Base) MCG/ACT inhaler Inhale 2 puffs into the lungs every 6 (six) hours as needed for wheezing or shortness of breath. 1 Inhaler 0  . atorvastatin (LIPITOR) 40 MG tablet Take 1 tablet (40 mg total) by mouth daily. 90 tablet 3  . glucose blood (ONE TOUCH ULTRA TEST) test strip Use up to 4 times/day 100 each 12  . Insulin Glargine (BASAGLAR KWIKPEN) 100 UNIT/ML SOPN INJECT 0.24 MLS (24 UNITS TOTAL) INTO THE SKIN AT BEDTIME. 15 pen 2  . Insulin Pen Needle 32G X 4 MM MISC 1 Units by Does not apply route every morning. Pen needles 90 each 3  . INVOKANA 100 MG TABS tablet TAKE 1 TABLET (100 MG TOTAL) BY MOUTH DAILY BEFORE BREAKFAST. 30 tablet 0  . lidocaine-prilocaine (EMLA) cream Apply to affected area once 30 g 3  . Melatonin 10 MG TABS Take 20 mg by mouth at bedtime as needed (sleep).    . metFORMIN (GLUCOPHAGE) 500 MG tablet TAKE 2 TABLETS (1,000 MG TOTAL) BY  MOUTH 2 (TWO) TIMES DAILY WITH A MEAL. 120 tablet 1  . ondansetron (ZOFRAN) 8 MG tablet Take 1 tablet (8 mg total) by mouth 2 (two) times daily as needed. Start on the third day after chemotherapy. 30 tablet 1  . prochlorperazine (COMPAZINE) 10 MG tablet Take 1 tablet (10 mg total) by mouth every 6 (six) hours as needed (Nausea or vomiting). 30 tablet 1  . venlafaxine XR (EFFEXOR-XR) 150 MG 24 hr capsule TAKE 1 CAPSULE (150 MG TOTAL) BY MOUTH DAILY WITH BREAKFAST. 90 capsule 1   No current facility-administered medications for this visit.     Review of Systems Review of Systems  Constitutional: Negative.   Respiratory: Negative.   Cardiovascular: Negative.     Blood pressure 108/73, pulse 74, resp. rate 13, height 5\' 5"  (1.651 m), weight 188 lb (85.3 kg).  Physical Exam Physical Exam  Constitutional: She is oriented to person, place, and time. She appears well-developed and well-nourished.  Neurological: She  is alert and oriented to person, place, and time.  Skin: Skin is warm and dry.    Data Reviewed Port site clean without evidence of infection or induration.  ChloraPrep, 0.5 cc 1% plain Xylocaine.  Port was accessed without difficulty.  Aspirated and irrigated well.  Tubing clamped, Tegaderm applied, patient sent to the cancer center for treatment.   Assessment    Functioning port.    Plan   Follow up appointment to be announced. The patient is aware to call back for any questions or concerns.   Carolyn, Physical Exam, Assessment and Plan have been scribed under the direction and in the presence of Hervey Ard, MD.  Gaspar Cola, CMA  I have completed the exam and reviewed the above documentation for accuracy and completeness.  I agree with the above.  Haematologist has been used and any errors in dictation or transcription are unintentional.  Hervey Ard, M.D., F.A.C.S.  [name] Lindzy Rupert 08/04/2017, 8:07 PM

## 2017-08-04 ENCOUNTER — Inpatient Hospital Stay: Payer: Managed Care, Other (non HMO)

## 2017-08-04 DIAGNOSIS — C50812 Malignant neoplasm of overlapping sites of left female breast: Secondary | ICD-10-CM

## 2017-08-04 DIAGNOSIS — Z5111 Encounter for antineoplastic chemotherapy: Secondary | ICD-10-CM | POA: Diagnosis not present

## 2017-08-04 MED ORDER — PEGFILGRASTIM INJECTION 6 MG/0.6ML ~~LOC~~
6.0000 mg | PREFILLED_SYRINGE | Freq: Once | SUBCUTANEOUS | Status: AC
  Administered 2017-08-04: 6 mg via SUBCUTANEOUS

## 2017-08-08 ENCOUNTER — Other Ambulatory Visit: Payer: Self-pay | Admitting: Unknown Physician Specialty

## 2017-08-08 DIAGNOSIS — E1165 Type 2 diabetes mellitus with hyperglycemia: Secondary | ICD-10-CM

## 2017-08-08 NOTE — Progress Notes (Signed)
Order put in for Endocrinology

## 2017-08-11 ENCOUNTER — Ambulatory Visit: Payer: Managed Care, Other (non HMO)

## 2017-08-11 ENCOUNTER — Ambulatory Visit: Payer: Managed Care, Other (non HMO) | Admitting: Hematology and Oncology

## 2017-08-11 ENCOUNTER — Other Ambulatory Visit: Payer: Managed Care, Other (non HMO)

## 2017-08-15 ENCOUNTER — Other Ambulatory Visit: Payer: Self-pay | Admitting: Hematology and Oncology

## 2017-08-16 NOTE — Progress Notes (Signed)
[facility] day:  08/16/2017  Chief Complaint: SAHIRAH RUDELL is a 52 y.o. female with clinical stage IIIA left breast cancer and cervical polyp who is seen for assessment on day 1 of cycle #3 AC.  HPI:  The patient was last seen in the medical oncology clinic on 08/03/2017.  At that time, patient was doing well.  She was tolerating her treatment without complaints of nausea and vomiting.  Patient attempted dietary modifications to help control her blood sugars.  CBGs running in the 120-180s.  Patient had issues with her port, and was subsequently sent to surgeon's office for evaluation.  Surgeon able to access port without difficulties, allowing for patient to return to the clinic for her treatment. Exam stable.  [place]  7300.  Blood sugar 187.  She received cycle #2 AC chemotherapy.  Follow-up communication with patient's PCP received regarding patient's blood glucose management.  Patient has been difficult to manage in the past, and has therefore been referred to endocrinology for further evaluation. To date, patient advising that she has not been contacted by endocrinology office. In review of [facility], patient was referred to [facility].   In the interim, patient notes that her first week after chemo is "bad".  She experienced nausea, vomiting, and increased fatigue.  She  denies diarrhea.  Patient notes that her symptoms are well controlled with prescribed interventions. Patient denies that she has experienced any B symptoms. She denies any interval infections.  Patient did not experience any difficulties with her port being accessed today.  She notes that the nurse who accessed her port advised her that she would require the "long needle" to prevent access issues.  Patient denies palmar-plantar erythrodysesthesia, neuropathy, and cold sensitivity associated with the current treatment regimen. Patient advises that appetite has somewhat decreased. She is  eating "ok". Weight today is 188 lb 1 oz (85.3 kg), which compared to her last visit to the clinic, represents a  1 pound loss.  Patient continues to struggle with her blood glucose management.  CBGs reported to be running between 120 and 330s.   Patient denies pain in the clinic today.  Past Medical History:  Diagnosis Date  . Cancer (HCC)    BREAST  . Depression   . Diabetes mellitus without complication ([place])   . Endometriosis   . Family history of breast cancer   . Hyperlipidemia   . Hypertension   . Ovarian mass   . Pneumonia     Past Surgical History:  Procedure Laterality Date  . AXILLARY LYMPH NODE BIOPSY Left 07/14/2017   Procedure: INSERTION GEL MARK CLIP LEFT AXILLA;  Surgeon: Robert Bellow, MD;  Location: [facility] ORS;  Service: General;  Laterality: Left;  . OOPHORECTOMY    . PORTACATH PLACEMENT Right 07/14/2017   Procedure: INSERTION PORT-A-CATH;  Surgeon: Robert Bellow, MD;  Location: [facility] ORS;  Service: General;  Laterality: Right;  . TUBAL LIGATION      Family History  Problem Relation Age of Onset  . Other Father        No info about father or paternal relatives  . Diabetes Brother   . Pancreatitis Brother   . Prostate cancer Brother 90       currently 24 / maternal half-brother  . Breast cancer Maternal Grandmother 40       deceased 7s  . Breast cancer Maternal Aunt 65       currently 21  . Breast  cancer Other 52       mother's sister; deceased 33  . Breast cancer Other        mother's sister; age at dx unknown    Social History:  reports that she has been smoking cigarettes.  She has a 5.50 pack-year smoking history. She has quit using smokeless tobacco. Her smokeless tobacco use included snuff. She reports that she does not drink alcohol or use drugs.  She smokes 0.5-1 ppd x 11 years. She works at [employer] in [place].  Patient denies known exposures to radiation on toxins. Her fiance is Judee Clara.  The patient is alone  today.  Allergies:  Allergies  Allergen Reactions  . Aspirin Nausea And Vomiting    Current Medications: Current Outpatient Medications  Medication Sig Dispense Refill  . albuterol (PROVENTIL HFA;VENTOLIN HFA) 108 (90 Base) MCG/ACT inhaler Inhale 2 puffs into the lungs every 6 (six) hours as needed for wheezing or shortness of breath. 1 Inhaler 0  . atorvastatin (LIPITOR) 40 MG tablet Take 1 tablet (40 mg total) by mouth daily. 90 tablet 3  . glucose blood (ONE TOUCH ULTRA TEST) test strip Use up to 4 times/day 100 each 12  . Insulin Glargine (BASAGLAR KWIKPEN) 100 UNIT/ML SOPN INJECT 0.24 MLS (24 UNITS TOTAL) INTO THE SKIN AT BEDTIME. 15 pen 2  . Insulin Pen Needle 32G X 4 MM MISC 1 Units by Does not apply route every morning. Pen needles 90 each 3  . INVOKANA 100 MG TABS tablet TAKE 1 TABLET (100 MG TOTAL) BY MOUTH DAILY BEFORE BREAKFAST. 30 tablet 0  . lidocaine-prilocaine (EMLA) cream Apply to affected area once 30 g 3  . Melatonin 10 MG TABS Take 20 mg by mouth at bedtime as needed (sleep).    . metFORMIN (GLUCOPHAGE) 500 MG tablet TAKE 2 TABLETS (1,000 MG TOTAL) BY MOUTH 2 (TWO) TIMES DAILY WITH A MEAL. 120 tablet 1  . ondansetron (ZOFRAN) 8 MG tablet Take 1 tablet (8 mg total) by mouth 2 (two) times daily as needed. Start on the third day after chemotherapy. 30 tablet 1  . prochlorperazine (COMPAZINE) 10 MG tablet Take 1 tablet (10 mg total) by mouth every 6 (six) hours as needed (Nausea or vomiting). 30 tablet 1  . venlafaxine XR (EFFEXOR-XR) 150 MG 24 hr capsule TAKE 1 CAPSULE (150 MG TOTAL) BY MOUTH DAILY WITH BREAKFAST. 90 capsule 1   No current facility-administered medications for this visit.     Review of Systems  Constitutional: Positive for malaise/fatigue and weight loss (down 1 pound). Negative for diaphoresis and fever.       " I felt bad the week after chemo, but I feel good now".  HENT: Negative.   Eyes: Negative.   Respiratory: Positive for cough (smoker) and  shortness of breath (exertional). Negative for hemoptysis and sputum production.   Cardiovascular: Negative for chest pain, palpitations, orthopnea, leg swelling and PND.  Gastrointestinal: Positive for nausea (following treatment - controlled with prescribed interventions) and vomiting (following treatment - controlled with prescribed interventions). Negative for abdominal pain, blood in stool, constipation, diarrhea and melena.  Genitourinary: Negative for dysuria, frequency, hematuria and urgency.  Musculoskeletal: Positive for joint pain (bilateral knees). Negative for back pain, falls and myalgias.  Skin: Negative for itching and rash.       Skin dimpling - LEFT breast. Skin red due to tanning bed use.    Neurological: Positive for headaches (PMH (+) for migranes). Negative for dizziness, tremors  and weakness.  Endo/Heme/Allergies: Does not bruise/bleed easily.       T2DM - has been referred to endocrinology.  Denies neuropathy.  Psychiatric/Behavioral: Negative for depression, memory loss and suicidal ideas. The patient has insomnia. The patient is not nervous/anxious.   All other systems reviewed and are negative.  Performance status (ECOG): 1 - Symptomatic but completely ambulatory  Vital signs BP 97/68 (BP Location: Left Arm, Patient Position: Sitting) Comment: 100/69 Standing  Pulse 82 Comment: Standing 89  Temp (!) 96.7 F (35.9 C) (Tympanic)   Resp 18   Wt 188 lb 1 oz (85.3 kg)   LMP  (LMP Unknown)   BMI 31.30 kg/m   Physical Exam  Constitutional: She is oriented to person, place, and time and well-developed, well-nourished, and in no distress.  HENT:  Head: Normocephalic and atraumatic.  Brown/auburn hair  Eyes: Pupils are equal, round, and reactive to light. EOM are normal. No scleral icterus.  Hazel eyes  Neck: Normal range of motion. Neck supple. No tracheal deviation present. No thyromegaly present.  Cardiovascular: Normal rate, regular rhythm and normal heart  sounds. Exam reveals no gallop and no friction rub.  No murmur heard. Pulmonary/Chest: Effort normal and breath sounds normal. No respiratory distress. She has no wheezes. She has no rales.  Abdominal: Soft. Bowel sounds are normal. She exhibits no distension. There is no tenderness.  Musculoskeletal: Normal range of motion. She exhibits no edema or tenderness.  Lymphadenopathy:    She has no cervical adenopathy.    She has no axillary adenopathy.       Right: No inguinal and no supraclavicular adenopathy present.       Left: No inguinal and no supraclavicular adenopathy present.  Neurological: She is alert and oriented to person, place, and time.  Skin: Skin is warm, dry and intact. No rash noted. There is erythema (Diffuse redness related to tanning bed use. ).     Psychiatric: Mood, affect and judgment normal.  Nursing note and vitals reviewed.   Infusion on 08/17/2017  Component Date Value Ref Range Status  . Sodium 08/17/2017 137  135 - 145 mmol/L Final  . Potassium 08/17/2017 3.9  3.5 - 5.1 mmol/L Final  . Chloride 08/17/2017 107  98 - 111 mmol/L Final   Please note change in reference range.  . CO2 08/17/2017 20* 22 - 32 mmol/L Final  . Glucose, Bld 08/17/2017 166* 70 - 99 mg/dL Final   Please note change in reference range.  . BUN 08/17/2017 10  6 - 20 mg/dL Final   Please note change in reference range.  . Creatinine, Ser 08/17/2017 0.47  0.44 - 1.00 mg/dL Final  . Calcium 08/17/2017 8.9  8.9 - 10.3 mg/dL Final  . Total Protein 08/17/2017 6.8  6.5 - 8.1 g/dL Final  . Albumin 08/17/2017 3.6  3.5 - 5.0 g/dL Final  . AST 08/17/2017 14* 15 - 41 U/L Final  . ALT 08/17/2017 14  0 - 44 U/L Final   Please note change in reference range.  . Alkaline Phosphatase 08/17/2017 94  38 - 126 U/L Final  . Total Bilirubin 08/17/2017 0.6  0.3 - 1.2 mg/dL Final  . GFR calc non Af Amer 08/17/2017 >60  >60 mL/min Final  . GFR calc Af Amer 08/17/2017 >60  >60 mL/min Final   Comment:  (NOTE) The eGFR has been calculated using the CKD EPI equation. This calculation has not been validated in all clinical situations. eGFR's persistently <60 mL/min signify possible  Chronic Kidney Disease.   Georgiann Hahn gap 08/17/2017 10  5 - 15 Final   Performed at [facility], [address] [place], [place] [address]  . WBC 08/17/2017 9.2  3.6 - 11.0 K/uL Final  . RBC 08/17/2017 4.36  3.80 - 5.20 MIL/uL Final  . Hemoglobin 08/17/2017 14.2  12.0 - 16.0 g/dL Final  . HCT 08/17/2017 40.7  35.0 - 47.0 % Final  . MCV 08/17/2017 93.2  80.0 - 100.0 fL Final  . MCH 08/17/2017 32.6  26.0 - 34.0 pg Final  . MCHC 08/17/2017 35.0  32.0 - 36.0 g/dL Final  . RDW 08/17/2017 14.1  11.5 - 14.5 % Final  . Platelets 08/17/2017 181  150 - 440 K/uL Final  . Neutrophils Relative % 08/17/2017 79  % Final  . Neutro Abs 08/17/2017 7.2* 1.4 - 6.5 K/uL Final  . Lymphocytes Relative 08/17/2017 14  % Final  . Lymphs Abs 08/17/2017 1.3  1.0 - 3.6 K/uL Final  . Monocytes Relative 08/17/2017 6  % Final  . Monocytes Absolute 08/17/2017 0.6  0.2 - 0.9 K/uL Final  . Eosinophils Relative 08/17/2017 0  % Final  . Eosinophils Absolute 08/17/2017 0.0  0 - 0.7 K/uL Final  . Basophils Relative 08/17/2017 1  % Final  . Basophils Absolute 08/17/2017 0.0  0 - 0.1 K/uL Final   Performed at [facility], [address], [place], [place] [address]  . Magnesium 08/17/2017 2.1  1.7 - 2.4 mg/dL Final   Performed at [facility], [address] [place], [place] [address]    Assessment:  ALLIZON WOZNICK is a 52 y.o. female with clinical stage IIIA (T3N1Mx) left breast cancer s/p biopsy on 06/14/2017.  Pathology revealed grade III invasive ductal carcinoma.  Axillary FNA revealed malignant cells c/w metastatic carcinoma. Tumor was ER + (90%), PR + (30%), Her2/neu - and Ki67 70%.  CA27.29 was 7.8 on 06/14/2017.  Bilateral mammogram on 06/12/2017 revealed a 3.5 cm irregular mass with associated architectural  distortion within the retroareolar left breast centered at the 3-4 o'clock axis.  There was probable surrounding satellite masses extending to overall measurement of 5.5 cm.  There were no masses in the right breast.  Utrasound on 06/14/2017 revealed a 2.4 x 4.0 x 5.1 cm hypoechoic mass in the retroareolar area.  The mass extended to the pectoralis fascia.  There was a prominent lymph node measuring 1.06 x 1.32 x 1.33 cm with cortical thickening.   Echo on 06/28/2017 revealed an EF of 60-65%  Chest, abdomen, and pelvis CT on 06/29/2017 revealed a LEFT breast mass measuring approximately 3.7 x 4.4 x 4.2 cm. There were borderline enlarged LEFT axillary and left internal mammary lymph nodes.  There was an infiltrative heterogeneously enhancing mass in the cervical region extending into the lower uterine segment that was concerning for a primary cervical carcinoma.  PET scan on 07/17/2017 revealed hypermetabolic central left breast mass (SUV 8.3) compatible with malignancy.  There was subtle accentuated activity in a small left axillary lymph node, but below background blood pool activity.  The cervical prominence noted on recent CT was not appreciably hypermetabolic.  Cervical biopsy on 07/05/2017 revealed an inflamed polyp with tubal metaplasia and focal grandular atypia.  There was no evidence of malignancy.  She is s/p cycle #2 AC with Neulasta support (08/03/2017).  She has a family history of breast cancer.  She is premenopausal.  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   Symptomatically, patient is doing well today.  She is tolerating chemotherapy treatments with minimal significant side effects.  She does have some nausea, vomiting, and fatigue following treatment, however the symptoms are controlled with the prescribed interventions.  She  denies neuropathy.  Exam is stable.  WBC 9200 ([place] 7200).  Blood sugar 166.  Renal and hepatic function normal.  Plan: 1. Labs today:  CBC with diff, CMP, Mg 2. Labs reviewed. Blood counts stable and adequate enough for treatment. Will proceed with cycle #2 AC with Neulasta support tomorrow. 3. Discuss HYPERglycemia. Working on diet and medication compliance. Glucose 166. Patient remains on Metformin 1000 mg BID, Invokana 100 mg daily, and Basaglar 24 units qhs. Additionally, she has SSI insulin coverage. Patient to receive dexamethasone IV with treatments. Discussed need for strict CBG monitoring at home, with appropriate SSI coverage. PCP has referred patient to endocrinology,   however patient has not been contacted for an appointment.  There is an active referral noted in [facility] for patient to be seen by endocrinology at [facility].  We will follow-up with PCP regarding status of the referral. 4. Review neutropenic precautions.  5. Discuss symptom management.  Patient has antiemetics and pain medications at home to use on a PRN basis. Patient  advising that the  prescribed interventions are adequate at this point. Continue all medications as previously prescribed.  6. RTC in 2 weeks for MD assessment, labs (CBC with diff, CMP, Mg), of cycle #4 AC.   Honor Loh, NP  08/16/2017, 2:21 PM

## 2017-08-17 ENCOUNTER — Inpatient Hospital Stay (HOSPITAL_BASED_OUTPATIENT_CLINIC_OR_DEPARTMENT_OTHER): Payer: Managed Care, Other (non HMO) | Admitting: Urgent Care

## 2017-08-17 ENCOUNTER — Inpatient Hospital Stay: Payer: Managed Care, Other (non HMO)

## 2017-08-17 ENCOUNTER — Encounter: Payer: Self-pay | Admitting: *Deleted

## 2017-08-17 ENCOUNTER — Inpatient Hospital Stay: Payer: Managed Care, Other (non HMO) | Attending: Hematology and Oncology

## 2017-08-17 VITALS — BP 97/68 | HR 82 | Temp 96.7°F | Resp 18 | Wt 188.1 lb

## 2017-08-17 DIAGNOSIS — Z5189 Encounter for other specified aftercare: Secondary | ICD-10-CM | POA: Diagnosis present

## 2017-08-17 DIAGNOSIS — F17210 Nicotine dependence, cigarettes, uncomplicated: Secondary | ICD-10-CM

## 2017-08-17 DIAGNOSIS — C50512 Malignant neoplasm of lower-outer quadrant of left female breast: Secondary | ICD-10-CM

## 2017-08-17 DIAGNOSIS — C50812 Malignant neoplasm of overlapping sites of left female breast: Secondary | ICD-10-CM

## 2017-08-17 DIAGNOSIS — Z7984 Long term (current) use of oral hypoglycemic drugs: Secondary | ICD-10-CM

## 2017-08-17 DIAGNOSIS — R739 Hyperglycemia, unspecified: Secondary | ICD-10-CM | POA: Insufficient documentation

## 2017-08-17 DIAGNOSIS — Z5111 Encounter for antineoplastic chemotherapy: Secondary | ICD-10-CM | POA: Insufficient documentation

## 2017-08-17 DIAGNOSIS — Z170 Estrogen receptor positive status [ER+]: Secondary | ICD-10-CM | POA: Diagnosis not present

## 2017-08-17 DIAGNOSIS — E119 Type 2 diabetes mellitus without complications: Secondary | ICD-10-CM | POA: Insufficient documentation

## 2017-08-17 DIAGNOSIS — Z803 Family history of malignant neoplasm of breast: Secondary | ICD-10-CM

## 2017-08-17 DIAGNOSIS — Z794 Long term (current) use of insulin: Secondary | ICD-10-CM | POA: Diagnosis not present

## 2017-08-17 DIAGNOSIS — N841 Polyp of cervix uteri: Secondary | ICD-10-CM

## 2017-08-17 DIAGNOSIS — T451X5A Adverse effect of antineoplastic and immunosuppressive drugs, initial encounter: Secondary | ICD-10-CM

## 2017-08-17 DIAGNOSIS — Z7189 Other specified counseling: Secondary | ICD-10-CM

## 2017-08-17 DIAGNOSIS — Z79899 Other long term (current) drug therapy: Secondary | ICD-10-CM | POA: Diagnosis not present

## 2017-08-17 DIAGNOSIS — R112 Nausea with vomiting, unspecified: Secondary | ICD-10-CM

## 2017-08-17 LAB — CBC WITH DIFFERENTIAL/PLATELET
Basophils Absolute: 0 10*3/uL (ref 0–0.1)
Basophils Relative: 1 %
Eosinophils Absolute: 0 10*3/uL (ref 0–0.7)
Eosinophils Relative: 0 %
HCT: 40.7 % (ref 35.0–47.0)
Hemoglobin: 14.2 g/dL (ref 12.0–16.0)
Lymphocytes Relative: 14 %
Lymphs Abs: 1.3 10*3/uL (ref 1.0–3.6)
MCH: 32.6 pg (ref 26.0–34.0)
MCHC: 35 g/dL (ref 32.0–36.0)
MCV: 93.2 fL (ref 80.0–100.0)
Monocytes Absolute: 0.6 10*3/uL (ref 0.2–0.9)
Monocytes Relative: 6 %
Neutro Abs: 7.2 10*3/uL — ABNORMAL HIGH (ref 1.4–6.5)
Neutrophils Relative %: 79 %
Platelets: 181 10*3/uL (ref 150–440)
RBC: 4.36 MIL/uL (ref 3.80–5.20)
RDW: 14.1 % (ref 11.5–14.5)
WBC: 9.2 10*3/uL (ref 3.6–11.0)

## 2017-08-17 LAB — COMPREHENSIVE METABOLIC PANEL
ALT: 14 U/L (ref 0–44)
AST: 14 U/L — ABNORMAL LOW (ref 15–41)
Albumin: 3.6 g/dL (ref 3.5–5.0)
Alkaline Phosphatase: 94 U/L (ref 38–126)
Anion gap: 10 (ref 5–15)
BUN: 10 mg/dL (ref 6–20)
CO2: 20 mmol/L — ABNORMAL LOW (ref 22–32)
Calcium: 8.9 mg/dL (ref 8.9–10.3)
Chloride: 107 mmol/L (ref 98–111)
Creatinine, Ser: 0.47 mg/dL (ref 0.44–1.00)
GFR calc Af Amer: >60 mL/min (ref >60)
GFR calc non Af Amer: >60 mL/min (ref >60)
Glucose, Bld: 166 mg/dL — ABNORMAL HIGH (ref 70–99)
Potassium: 3.9 mmol/L (ref 3.5–5.1)
Sodium: 137 mmol/L (ref 135–145)
Total Bilirubin: 0.6 mg/dL (ref 0.3–1.2)
Total Protein: 6.8 g/dL (ref 6.5–8.1)

## 2017-08-17 LAB — MAGNESIUM: Magnesium: 2.1 mg/dL (ref 1.7–2.4)

## 2017-08-17 MED ORDER — PALONOSETRON HCL INJECTION 0.25 MG/5ML
0.2500 mg | Freq: Once | INTRAVENOUS | Status: AC
  Administered 2017-08-17: 0.25 mg via INTRAVENOUS
  Filled 2017-08-17: qty 5

## 2017-08-17 MED ORDER — DOXORUBICIN HCL CHEMO IV INJECTION 2 MG/ML
60.0000 mg/m2 | Freq: Once | INTRAVENOUS | Status: AC
  Administered 2017-08-17: 122 mg via INTRAVENOUS
  Filled 2017-08-17: qty 61

## 2017-08-17 MED ORDER — HEPARIN SOD (PORK) LOCK FLUSH 100 UNIT/ML IV SOLN
500.0000 [IU] | Freq: Once | INTRAVENOUS | Status: AC
  Administered 2017-08-17: 500 [IU] via INTRAVENOUS
  Filled 2017-08-17: qty 5

## 2017-08-17 MED ORDER — SODIUM CHLORIDE 0.9 % IV SOLN
Freq: Once | INTRAVENOUS | Status: AC
  Administered 2017-08-17: 11:00:00 via INTRAVENOUS
  Filled 2017-08-17: qty 5

## 2017-08-17 MED ORDER — SODIUM CHLORIDE 0.9 % IV SOLN
Freq: Once | INTRAVENOUS | Status: AC
  Administered 2017-08-17: 10:00:00 via INTRAVENOUS
  Filled 2017-08-17: qty 1000

## 2017-08-17 MED ORDER — SODIUM CHLORIDE 0.9 % IV SOLN
600.0000 mg/m2 | Freq: Once | INTRAVENOUS | Status: AC
  Administered 2017-08-17: 1220 mg via INTRAVENOUS
  Filled 2017-08-17: qty 50

## 2017-08-17 NOTE — Progress Notes (Signed)
Patient states she had N/V the past 2 days.  States she was taking care of her grandchildren and thinks it may be something she picked up from them.  States there are some days she does not eat but others she is eating fine.

## 2017-08-18 ENCOUNTER — Inpatient Hospital Stay: Payer: Managed Care, Other (non HMO)

## 2017-08-18 DIAGNOSIS — Z5111 Encounter for antineoplastic chemotherapy: Secondary | ICD-10-CM | POA: Diagnosis not present

## 2017-08-18 DIAGNOSIS — C50812 Malignant neoplasm of overlapping sites of left female breast: Secondary | ICD-10-CM

## 2017-08-18 MED ORDER — PEGFILGRASTIM INJECTION 6 MG/0.6ML ~~LOC~~
6.0000 mg | PREFILLED_SYRINGE | Freq: Once | SUBCUTANEOUS | Status: AC
  Administered 2017-08-18: 6 mg via SUBCUTANEOUS

## 2017-08-21 ENCOUNTER — Telehealth: Payer: Self-pay

## 2017-08-21 DIAGNOSIS — E1165 Type 2 diabetes mellitus with hyperglycemia: Secondary | ICD-10-CM

## 2017-08-21 NOTE — Telephone Encounter (Signed)
-----   Message from Kathrine Haddock, NP sent at 08/21/2017  7:54 AM EDT ----- Regarding: RE: Endo referral [place] [facility] [place], can you check on this for me?  Also Gaspar Bidding, if it is going to be a while, please ask her to be seen here so we can at least start getting a handle on treatment for her DM ----- Message ----- From: Karen Kitchens, NP Sent: 08/19/2017   6:46 PM To: Kathrine Haddock, NP Subject: Endo referral                                  Cheryl....  Would you have your staff follow up on the referral to endocrinology on this patient? I saw her this week and she said that she hasn't heard anything. I know they can be tough to get in with, so I would like to get her in the list for an appointment.   Respectfully, Honor Loh, MSN, APRN, FNP-C, CEN Oncology/Hematology Nurse Practitioner  [place]

## 2017-08-21 NOTE — Telephone Encounter (Signed)
Called and spoke to [facility]. Referral has never been received. Will forward message to provider and referral coordinator to resubmit.

## 2017-08-31 ENCOUNTER — Inpatient Hospital Stay: Payer: Managed Care, Other (non HMO)

## 2017-08-31 ENCOUNTER — Inpatient Hospital Stay (HOSPITAL_BASED_OUTPATIENT_CLINIC_OR_DEPARTMENT_OTHER): Payer: Managed Care, Other (non HMO) | Admitting: Hematology and Oncology

## 2017-08-31 ENCOUNTER — Encounter: Payer: Self-pay | Admitting: *Deleted

## 2017-08-31 VITALS — BP 107/74 | HR 79 | Temp 95.1°F | Wt 186.4 lb

## 2017-08-31 DIAGNOSIS — N841 Polyp of cervix uteri: Secondary | ICD-10-CM | POA: Diagnosis not present

## 2017-08-31 DIAGNOSIS — R739 Hyperglycemia, unspecified: Secondary | ICD-10-CM | POA: Diagnosis not present

## 2017-08-31 DIAGNOSIS — Z803 Family history of malignant neoplasm of breast: Secondary | ICD-10-CM

## 2017-08-31 DIAGNOSIS — Z170 Estrogen receptor positive status [ER+]: Secondary | ICD-10-CM | POA: Diagnosis not present

## 2017-08-31 DIAGNOSIS — C50512 Malignant neoplasm of lower-outer quadrant of left female breast: Secondary | ICD-10-CM | POA: Diagnosis not present

## 2017-08-31 DIAGNOSIS — Z794 Long term (current) use of insulin: Secondary | ICD-10-CM

## 2017-08-31 DIAGNOSIS — F17210 Nicotine dependence, cigarettes, uncomplicated: Secondary | ICD-10-CM

## 2017-08-31 DIAGNOSIS — Z79899 Other long term (current) drug therapy: Secondary | ICD-10-CM

## 2017-08-31 DIAGNOSIS — C50812 Malignant neoplasm of overlapping sites of left female breast: Secondary | ICD-10-CM

## 2017-08-31 DIAGNOSIS — Z5111 Encounter for antineoplastic chemotherapy: Secondary | ICD-10-CM | POA: Diagnosis not present

## 2017-08-31 DIAGNOSIS — Z7984 Long term (current) use of oral hypoglycemic drugs: Secondary | ICD-10-CM

## 2017-08-31 DIAGNOSIS — E119 Type 2 diabetes mellitus without complications: Secondary | ICD-10-CM

## 2017-08-31 LAB — CBC WITH DIFFERENTIAL/PLATELET
Basophils Absolute: 0.1 10*3/uL (ref 0–0.1)
Basophils Relative: 1 %
Eosinophils Absolute: 0 10*3/uL (ref 0–0.7)
Eosinophils Relative: 0 %
HCT: 39.3 % (ref 35.0–47.0)
Hemoglobin: 13.9 g/dL (ref 12.0–16.0)
Lymphocytes Relative: 12 %
Lymphs Abs: 1 10*3/uL (ref 1.0–3.6)
MCH: 32.9 pg (ref 26.0–34.0)
MCHC: 35.4 g/dL (ref 32.0–36.0)
MCV: 92.9 fL (ref 80.0–100.0)
Monocytes Absolute: 0.6 10*3/uL (ref 0.2–0.9)
Monocytes Relative: 7 %
Neutro Abs: 7.1 10*3/uL — ABNORMAL HIGH (ref 1.4–6.5)
Neutrophils Relative %: 80 %
Platelets: 174 10*3/uL (ref 150–440)
RBC: 4.23 MIL/uL (ref 3.80–5.20)
RDW: 15.6 % — ABNORMAL HIGH (ref 11.5–14.5)
WBC: 8.9 10*3/uL (ref 3.6–11.0)

## 2017-08-31 LAB — COMPREHENSIVE METABOLIC PANEL
ALT: 15 U/L (ref 0–44)
AST: 14 U/L — ABNORMAL LOW (ref 15–41)
Albumin: 3.8 g/dL (ref 3.5–5.0)
Alkaline Phosphatase: 98 U/L (ref 38–126)
Anion gap: 11 (ref 5–15)
BUN: 10 mg/dL (ref 6–20)
CO2: 24 mmol/L (ref 22–32)
Calcium: 9.2 mg/dL (ref 8.9–10.3)
Chloride: 104 mmol/L (ref 98–111)
Creatinine, Ser: 0.46 mg/dL (ref 0.44–1.00)
GFR calc Af Amer: >60 mL/min (ref >60)
GFR calc non Af Amer: >60 mL/min (ref >60)
Glucose, Bld: 178 mg/dL — ABNORMAL HIGH (ref 70–99)
Potassium: 3.8 mmol/L (ref 3.5–5.1)
Sodium: 139 mmol/L (ref 135–145)
Total Bilirubin: 0.6 mg/dL (ref 0.3–1.2)
Total Protein: 6.8 g/dL (ref 6.5–8.1)

## 2017-08-31 LAB — MAGNESIUM: Magnesium: 2.1 mg/dL (ref 1.7–2.4)

## 2017-08-31 MED ORDER — PALONOSETRON HCL INJECTION 0.25 MG/5ML
0.2500 mg | Freq: Once | INTRAVENOUS | Status: AC
  Administered 2017-08-31: 0.25 mg via INTRAVENOUS
  Filled 2017-08-31: qty 5

## 2017-08-31 MED ORDER — DOXORUBICIN HCL CHEMO IV INJECTION 2 MG/ML
60.0000 mg/m2 | Freq: Once | INTRAVENOUS | Status: AC
  Administered 2017-08-31: 122 mg via INTRAVENOUS
  Filled 2017-08-31: qty 61

## 2017-08-31 MED ORDER — SODIUM CHLORIDE 0.9 % IV SOLN
Freq: Once | INTRAVENOUS | Status: AC
  Administered 2017-08-31: 09:00:00 via INTRAVENOUS
  Filled 2017-08-31: qty 1000

## 2017-08-31 MED ORDER — HEPARIN SOD (PORK) LOCK FLUSH 100 UNIT/ML IV SOLN
500.0000 [IU] | Freq: Once | INTRAVENOUS | Status: AC
  Administered 2017-08-31: 500 [IU] via INTRAVENOUS
  Filled 2017-08-31: qty 5

## 2017-08-31 MED ORDER — SODIUM CHLORIDE 0.9 % IV SOLN
600.0000 mg/m2 | Freq: Once | INTRAVENOUS | Status: AC
  Administered 2017-08-31: 1220 mg via INTRAVENOUS
  Filled 2017-08-31: qty 50

## 2017-08-31 MED ORDER — SODIUM CHLORIDE 0.9 % IV SOLN
Freq: Once | INTRAVENOUS | Status: AC
  Administered 2017-08-31: 10:00:00 via INTRAVENOUS
  Filled 2017-08-31: qty 5

## 2017-08-31 MED ORDER — SODIUM CHLORIDE 0.9% FLUSH
10.0000 mL | Freq: Once | INTRAVENOUS | Status: AC
  Administered 2017-08-31: 10 mL via INTRAVENOUS
  Filled 2017-08-31: qty 10

## 2017-08-31 NOTE — Progress Notes (Signed)
Patient states she has had tenderness in left axillary area.  States her appetite has been off some.  Otherwise, offers no complaints.

## 2017-08-31 NOTE — Progress Notes (Signed)
  Oncology Nurse Navigator Documentation  Navigator Location: [facility] (08/31/17 1100)   )Navigator Encounter Type: Clinic/MDC (08/31/17 1100)                       Treatment Phase: Active Tx (08/31/17 1100) Barriers/Navigation Needs: No Needs (08/31/17 1100)                          Time Spent with Patient: 15 (08/31/17 1100)   Met patient during her chemotherapy.  No needs at this time.

## 2017-08-31 NOTE — Progress Notes (Signed)
[facility] day:  08/31/2017  Chief Complaint: Carolyn Roy is a 52 y.o. female with clinical stage IIIA left breast cancer who is seen for assessment prior to cycle #4 neoadjuvant AC.  HPI:  The patient was last seen in the medical oncology clinic on 08/17/2017.  At that time, she was doing well.  She was tolerating chemotherapy treatments with minimal significant side effects.  She described some nausea, vomiting, and fatigue following treatment.  Hwever the symptoms were controlled with the prescribed interventions. Exam was stable.  WBC was 9200 ([place] 7200).  Blood sugar was 166.  Renal and hepatic function were normal.  She received cycle #3 AC.  Patient seen in consult on 08/30/2017 by Malissa Hippo, NP (endocrinology). Notes reviewed. Hemoglobin A1c 11.2. Medications increased. She will now be taking Invokana 300 mg daily, in addition to her Metformin 1000 mg BID. Patient to increase basal insulin (Basaglar) coverage by 1 unit daily until she reaches a FSBS of 120. Dietary modifications reviewed. Patient to follow up in 6 weeks.   During the interim, patient is doing well. She notes that the first week was "kinda hard". She states, "it was like one day I woke up and I felt like nothing happened". Patient notes after about a week her energy returned. She experienced nausea that was well controlled with her prescribed interventions. She had more diarrhea and abdominal cramping. Patient has LEFT axillary tenderness. Today, patient notes that there is a "haze of oil" over her eyes all of the time.   Patient denies that she has experienced any B symptoms. She denies any interval infections. Patient advises that she she has had a poor appetite. Weight today is 186 lb 7 oz (84.6 kg), which compared to her last visit to the clinic, represents a  2 pound decrease. Patient continues to have issues with her blood sugars.   Patient denies pain in the clinic  today.   Past Medical History:  Diagnosis Date  . Cancer (HCC)    BREAST  . Depression   . Diabetes mellitus without complication ([place])   . Endometriosis   . Family history of breast cancer   . Hyperlipidemia   . Hypertension   . Ovarian mass   . Pneumonia     Past Surgical History:  Procedure Laterality Date  . AXILLARY LYMPH NODE BIOPSY Left 07/14/2017   Procedure: INSERTION GEL MARK CLIP LEFT AXILLA;  Surgeon: Robert Bellow, MD;  Location: [facility] ORS;  Service: General;  Laterality: Left;  . OOPHORECTOMY    . PORTACATH PLACEMENT Right 07/14/2017   Procedure: INSERTION PORT-A-CATH;  Surgeon: Robert Bellow, MD;  Location: [facility] ORS;  Service: General;  Laterality: Right;  . TUBAL LIGATION      Family History  Problem Relation Age of Onset  . Other Father        No info about father or paternal relatives  . Diabetes Brother   . Pancreatitis Brother   . Prostate cancer Brother 62       currently 64 / maternal half-brother  . Breast cancer Maternal Grandmother 40       deceased 59s  . Breast cancer Maternal Aunt 65       currently 59  . Breast cancer Other 74       mother's sister; deceased 39  . Breast cancer Other        mother's sister; age at dx unknown    Social  History:  reports that she has been smoking cigarettes.  She has a 5.50 pack-year smoking history. She has quit using smokeless tobacco. Her smokeless tobacco use included snuff. She reports that she does not drink alcohol or use drugs.  She smokes 0.5-1 ppd x 11 years. She works at [employer] in [place].  Patient denies known exposures to radiation on toxins. Her fiance is Judee Clara.  The patient is alone today.  Allergies:  Allergies  Allergen Reactions  . Aspirin Nausea And Vomiting    Current Medications: Current Outpatient Medications  Medication Sig Dispense Refill  . albuterol (PROVENTIL HFA;VENTOLIN HFA) 108 (90 Base) MCG/ACT inhaler Inhale 2 puffs into the lungs every  6 (six) hours as needed for wheezing or shortness of breath. 1 Inhaler 0  . atorvastatin (LIPITOR) 40 MG tablet Take 1 tablet (40 mg total) by mouth daily. 90 tablet 3  . glucose blood (ONE TOUCH ULTRA TEST) test strip Use up to 4 times/day 100 each 12  . Insulin Glargine (BASAGLAR KWIKPEN) 100 UNIT/ML SOPN INJECT 0.24 MLS (24 UNITS TOTAL) INTO THE SKIN AT BEDTIME. 15 pen 2  . Insulin Pen Needle 32G X 4 MM MISC 1 Units by Does not apply route every morning. Pen needles 90 each 3  . INVOKANA 100 MG TABS tablet TAKE 1 TABLET (100 MG TOTAL) BY MOUTH DAILY BEFORE BREAKFAST. 30 tablet 0  . lidocaine-prilocaine (EMLA) cream Apply to affected area once 30 g 3  . Melatonin 10 MG TABS Take 20 mg by mouth at bedtime as needed (sleep).    . metFORMIN (GLUCOPHAGE) 500 MG tablet TAKE 2 TABLETS (1,000 MG TOTAL) BY MOUTH 2 (TWO) TIMES DAILY WITH A MEAL. 120 tablet 1  . ondansetron (ZOFRAN) 8 MG tablet Take 1 tablet (8 mg total) by mouth 2 (two) times daily as needed. Start on the third day after chemotherapy. 30 tablet 1  . prochlorperazine (COMPAZINE) 10 MG tablet Take 1 tablet (10 mg total) by mouth every 6 (six) hours as needed (Nausea or vomiting). 30 tablet 1  . venlafaxine XR (EFFEXOR-XR) 150 MG 24 hr capsule TAKE 1 CAPSULE (150 MG TOTAL) BY MOUTH DAILY WITH BREAKFAST. (Patient not taking: Reported on 08/31/2017) 90 capsule 1   No current facility-administered medications for this visit.    Facility-Administered Medications Ordered in Other Visits  Medication Dose Route Frequency Provider Last Rate Last Dose  . heparin lock flush 100 unit/mL  500 Units Intravenous Once Lequita Asal, MD        Review of Systems  Constitutional: Positive for malaise/fatigue and weight loss (down 2 pounds). Negative for diaphoresis and fever.       " I felt bad the week after chemo, but I feel good now".  HENT: Negative.   Eyes: Positive for blurred vision ("it looks and feels like I have a haze of oil over my  eyes"). Negative for pain and redness.  Respiratory: Positive for cough (smoker) and shortness of breath (exertional). Negative for hemoptysis and sputum production.   Cardiovascular: Negative for chest pain, palpitations, orthopnea, leg swelling and PND.  Gastrointestinal: Positive for abdominal pain (cramping), diarrhea and nausea (following treatment - controlled with prescribed interventions). Negative for blood in stool, constipation, melena and vomiting.  Genitourinary: Negative for dysuria, frequency, hematuria and urgency.  Musculoskeletal: Positive for joint pain (bilateral knees). Negative for back pain, falls and myalgias.  Skin: Negative for itching and rash.       Skin dimpling -  LEFT breast. Skin red due to tanning bed use.    Neurological: Positive for headaches (PMH (+) for migranes). Negative for dizziness, tremors and weakness.  Endo/Heme/Allergies: Does not bruise/bleed easily.       T2DM - has been referred to endocrinology.  Denies neuropathy.  Psychiatric/Behavioral: Negative for depression, memory loss and suicidal ideas. The patient has insomnia. The patient is not nervous/anxious.   All other systems reviewed and are negative.  Performance status (ECOG): 1 - Symptomatic but completely ambulatory  Vital signs BP 107/74 (BP Location: Left Arm, Patient Position: Sitting)   Pulse 79   Temp (!) 95.1 F (35.1 C) (Tympanic)   Wt 186 lb 7 oz (84.6 kg)   LMP  (LMP Unknown)   BMI 31.02 kg/m   Physical Exam  Constitutional: She is oriented to person, place, and time and well-developed, well-nourished, and in no distress.  HENT:  Head: Normocephalic and atraumatic.  Alopecia.  Eyes: Pupils are equal, round, and reactive to light. EOM are normal. No scleral icterus.  Hazel eyes  Neck: Normal range of motion. Neck supple. No tracheal deviation present. No thyromegaly present.  Cardiovascular: Normal rate, regular rhythm and normal heart sounds. Exam reveals no gallop and  no friction rub.  No murmur heard. Pulmonary/Chest: Effort normal and breath sounds normal. No respiratory distress. She has no wheezes. She has no rales.  Abdominal: Soft. Bowel sounds are normal. She exhibits no distension. There is no tenderness.  Musculoskeletal: Normal range of motion. She exhibits no edema or tenderness.  Lymphadenopathy:    She has no cervical adenopathy.    She has no axillary adenopathy.       Right: No inguinal and no supraclavicular adenopathy present.       Left: No inguinal and no supraclavicular adenopathy present.  Neurological: She is alert and oriented to person, place, and time.  Skin: Skin is warm and dry. No rash noted. No erythema.  Psychiatric: Mood, affect and judgment normal.  Nursing note and vitals reviewed.   Infusion on 08/31/2017  Component Date Value Ref Range Status  . Magnesium 08/31/2017 2.1  1.7 - 2.4 mg/dL Final   Performed at [facility], [address], [place], [place] [address]  . Sodium 08/31/2017 139  135 - 145 mmol/L Final  . Potassium 08/31/2017 3.8  3.5 - 5.1 mmol/L Final  . Chloride 08/31/2017 104  98 - 111 mmol/L Final  . CO2 08/31/2017 24  22 - 32 mmol/L Final  . Glucose, Bld 08/31/2017 178* 70 - 99 mg/dL Final  . BUN 08/31/2017 10  6 - 20 mg/dL Final  . Creatinine, Ser 08/31/2017 0.46  0.44 - 1.00 mg/dL Final  . Calcium 08/31/2017 9.2  8.9 - 10.3 mg/dL Final  . Total Protein 08/31/2017 6.8  6.5 - 8.1 g/dL Final  . Albumin 08/31/2017 3.8  3.5 - 5.0 g/dL Final  . AST 08/31/2017 14* 15 - 41 U/L Final  . ALT 08/31/2017 15  0 - 44 U/L Final  . Alkaline Phosphatase 08/31/2017 98  38 - 126 U/L Final  . Total Bilirubin 08/31/2017 0.6  0.3 - 1.2 mg/dL Final  . GFR calc non Af Amer 08/31/2017 >60  >60 mL/min Final  . GFR calc Af Amer 08/31/2017 >60  >60 mL/min Final   Comment: (NOTE) The eGFR has been calculated using the CKD EPI equation. This calculation has not been validated in all clinical situations. eGFR's  persistently <60 mL/min signify possible Chronic Kidney Disease.   [name]  Anion gap 08/31/2017 11  5 - 15 Final   Performed at [facility], [address] [place], [place] [address]  . WBC 08/31/2017 8.9  3.6 - 11.0 K/uL Final  . RBC 08/31/2017 4.23  3.80 - 5.20 MIL/uL Final  . Hemoglobin 08/31/2017 13.9  12.0 - 16.0 g/dL Final  . HCT 08/31/2017 39.3  35.0 - 47.0 % Final  . MCV 08/31/2017 92.9  80.0 - 100.0 fL Final  . MCH 08/31/2017 32.9  26.0 - 34.0 pg Final  . MCHC 08/31/2017 35.4  32.0 - 36.0 g/dL Final  . RDW 08/31/2017 15.6* 11.5 - 14.5 % Final  . Platelets 08/31/2017 174  150 - 440 K/uL Final  . Neutrophils Relative % 08/31/2017 80  % Final  . Neutro Abs 08/31/2017 7.1* 1.4 - 6.5 K/uL Final  . Lymphocytes Relative 08/31/2017 12  % Final  . Lymphs Abs 08/31/2017 1.0  1.0 - 3.6 K/uL Final  . Monocytes Relative 08/31/2017 7  % Final  . Monocytes Absolute 08/31/2017 0.6  0.2 - 0.9 K/uL Final  . Eosinophils Relative 08/31/2017 0  % Final  . Eosinophils Absolute 08/31/2017 0.0  0 - 0.7 K/uL Final  . Basophils Relative 08/31/2017 1  % Final  . Basophils Absolute 08/31/2017 0.1  0 - 0.1 K/uL Final   Performed at [facility], [address] [place], [place] [address]    Assessment:  Carolyn Roy is a 52 y.o. female with clinical stage IIIA (T3N1Mx) left breast cancer s/p biopsy on 06/14/2017.  Pathology revealed grade III invasive ductal carcinoma.  Axillary FNA revealed malignant cells c/w metastatic carcinoma. Tumor was ER + (90%), PR + (30%), Her2/neu - and Ki67 70%.  CA27.29 was 7.8 on 06/14/2017.  Bilateral mammogram on 06/12/2017 revealed a 3.5 cm irregular mass with associated architectural distortion within the retroareolar left breast centered at the 3-4 o'clock axis.  There was probable surrounding satellite masses extending to overall measurement of 5.5 cm.  There were no masses in the right breast.  Utrasound on 06/14/2017 revealed a 2.4 x 4.0 x 5.1 cm  hypoechoic mass in the retroareolar area.  The mass extended to the pectoralis fascia.  There was a prominent lymph node measuring 1.06 x 1.32 x 1.33 cm with cortical thickening.   Echo on 06/28/2017 revealed an EF of 60-65%  Chest, abdomen, and pelvis CT on 06/29/2017 revealed a LEFT breast mass measuring approximately 3.7 x 4.4 x 4.2 cm. There were borderline enlarged LEFT axillary and left internal mammary lymph nodes.  There was an infiltrative heterogeneously enhancing mass in the cervical region extending into the lower uterine segment that was concerning for a primary cervical carcinoma.  PET scan on 07/17/2017 revealed hypermetabolic central left breast mass (SUV 8.3) compatible with malignancy.  There was subtle accentuated activity in a small left axillary lymph node, but below background blood pool activity.  The cervical prominence noted on recent CT was not appreciably hypermetabolic.  Cervical biopsy on 07/05/2017 revealed an inflamed polyp with tubal metaplasia and focal grandular atypia.  There was no evidence of malignancy.  She is s/p 3 cycles of AC with Neulasta support (07/20/2017 - 08/17/2017).  She has a family history of breast cancer.  She is premenopausal.  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   Symptomatically, patient is doing well. She is tolerating treatment well.  She has fatigue, nausea, and diarrhea the week following her treatments.   Plan: 1. Labs today:  CBC with diff, CMP, Mg. 2. Labs reviewed. Blood counts stable and adequate enough for treatment. Will proceed with cycle #4 AC with Neulasta support.  3. Discuss follow-up breast ultrasound with Dr. Bary Castilla in 10-12 days (prior to next cycle of chemotherapy). Needs to be scheduled week of 09/11/2017. 4. Discuss plans for weekly Taxol x 12 cycles  beginning in 2 weeks. Side effects reviewed.  Discussed premedications with Decadron and need to manage blood sugar. Discuss symptom management.  Patient has antiemetics and pain medications at home to use on a PRN basis. Patient  advising that the  prescribed interventions are adequate at this point. Continue all medications as previously prescribed.  Discuss HYPERglycemia. Blood sugar 178 today in clinic. Working on diet and medication compliance. Patient remains on Metformin 1000 mg BID and Basaglar 24 units qhs. Her Invovaka was just increased to 300 mg daily.  Additionally, she has SSI insulin coverage. Patient to receive dexamethasone IV with treatments. Discussed need for strict CBG monitoring at home, with appropriate SSI coverage. Patient has been seen by endocrinology, and medication dose adjustments have been made.  Review neutropenic precautions.  5. RTC on 09/14/2017 for MD assessment, labs (CBC with diff, CMP, Mg), of cycle #1 Taxol.   Honor Loh, NP  08/31/2017, 9:00 AM   I saw and evaluated the patient, participating in the key portions of the service and reviewing pertinent diagnostic studies and records.  I reviewed the nurse practitioner's note and agree with the findings and the plan.  The assessment and plan were discussed with the patient. Several questions were asked by the patient and answered.   Nolon Stalls, MD 08/31/2017,9:00 AM

## 2017-09-01 ENCOUNTER — Inpatient Hospital Stay: Payer: Managed Care, Other (non HMO)

## 2017-09-01 DIAGNOSIS — C50812 Malignant neoplasm of overlapping sites of left female breast: Secondary | ICD-10-CM

## 2017-09-01 DIAGNOSIS — Z5111 Encounter for antineoplastic chemotherapy: Secondary | ICD-10-CM | POA: Diagnosis not present

## 2017-09-01 MED ORDER — PEGFILGRASTIM INJECTION 6 MG/0.6ML ~~LOC~~
6.0000 mg | PREFILLED_SYRINGE | Freq: Once | SUBCUTANEOUS | Status: AC
  Administered 2017-09-01: 6 mg via SUBCUTANEOUS

## 2017-09-12 ENCOUNTER — Encounter: Payer: Self-pay | Admitting: General Surgery

## 2017-09-12 ENCOUNTER — Ambulatory Visit: Payer: Managed Care, Other (non HMO) | Admitting: General Surgery

## 2017-09-12 ENCOUNTER — Ambulatory Visit: Payer: Self-pay

## 2017-09-12 VITALS — BP 102/70 | HR 89 | Resp 14 | Ht 65.0 in | Wt 188.0 lb

## 2017-09-12 DIAGNOSIS — Z170 Estrogen receptor positive status [ER+]: Secondary | ICD-10-CM | POA: Diagnosis not present

## 2017-09-12 DIAGNOSIS — C50512 Malignant neoplasm of lower-outer quadrant of left female breast: Secondary | ICD-10-CM

## 2017-09-12 DIAGNOSIS — C50812 Malignant neoplasm of overlapping sites of left female breast: Secondary | ICD-10-CM

## 2017-09-12 NOTE — Progress Notes (Signed)
Patient ID: Carolyn Roy, female   DOB: 03-05-1965, 52 y.o.   MRN: [id number]  Chief Complaint  Patient presents with  . Follow-up    HPI Carolyn Roy is a 52 y.o. female here today for follow up left breast ultrasound prior to her next treatment per Dr Mike Gip.  She has had her medications adjusted and her morning blood sugars are still over 200.  She is here with her significant other, Carolyn Roy.   HPI  Past Medical History:  Diagnosis Date  . Cancer (HCC)    BREAST  . Depression   . Diabetes mellitus without complication ([place]) 2706  . Endometriosis   . Family history of breast cancer   . Hyperlipidemia   . Hypertension   . Ovarian mass   . Pneumonia     Past Surgical History:  Procedure Laterality Date  . AXILLARY LYMPH NODE BIOPSY Left 07/14/2017   Procedure: INSERTION GEL MARK CLIP LEFT AXILLA;  Surgeon: Robert Bellow, MD;  Location: [facility] ORS;  Service: General;  Laterality: Left;  . OOPHORECTOMY    . PORTACATH PLACEMENT Right 07/14/2017   Procedure: INSERTION PORT-A-CATH;  Surgeon: Robert Bellow, MD;  Location: [facility] ORS;  Service: General;  Laterality: Right;  . TUBAL LIGATION      Family History  Problem Relation Age of Onset  . Other Father        No info about father or paternal relatives  . Diabetes Brother   . Pancreatitis Brother   . Prostate cancer Brother 41       currently 52 / maternal half-brother  . Breast cancer Maternal Grandmother 40       deceased 59s  . Breast cancer Maternal Aunt 65       currently 45  . Breast cancer Other 30       mother's sister; deceased 30  . Breast cancer Other        mother's sister; age at dx unknown    Social History Social History   Tobacco Use  . Smoking status: Current Every Day Smoker    Packs/day: 0.50    Years: 11.00    Pack years: 5.50    Types: Cigarettes  . Smokeless tobacco: Former Systems developer    Types: Snuff  Substance Use Topics  . Alcohol use: No    Alcohol/week: 0.0 oz   . Drug use: No    Allergies  Allergen Reactions  . Aspirin Nausea And Vomiting    Current Outpatient Medications  Medication Sig Dispense Refill  . albuterol (PROVENTIL HFA;VENTOLIN HFA) 108 (90 Base) MCG/ACT inhaler Inhale 2 puffs into the lungs every 6 (six) hours as needed for wheezing or shortness of breath. 1 Inhaler 0  . atorvastatin (LIPITOR) 40 MG tablet Take 1 tablet (40 mg total) by mouth daily. 90 tablet 3  . glucose blood (ONE TOUCH ULTRA TEST) test strip Use up to 4 times/day 100 each 12  . Insulin Glargine (BASAGLAR KWIKPEN) 100 UNIT/ML SOPN INJECT 0.24 MLS (24 UNITS TOTAL) INTO THE SKIN AT BEDTIME. 15 pen 2  . Insulin Pen Needle 32G X 4 MM MISC 1 Units by Does not apply route every morning. Pen needles 90 each 3  . INVOKANA 100 MG TABS tablet TAKE 1 TABLET (100 MG TOTAL) BY MOUTH DAILY BEFORE BREAKFAST. (Patient taking differently: TAKE  TABLET (300 MG TOTAL) BY MOUTH DAILY BEFORE BREAKFAST.) 30 tablet 0  . lidocaine-prilocaine (EMLA) cream Apply to affected area once 30 g 3  .  Melatonin 10 MG TABS Take 20 mg by mouth at bedtime as needed (sleep).    . metFORMIN (GLUCOPHAGE) 500 MG tablet TAKE 2 TABLETS (1,000 MG TOTAL) BY MOUTH 2 (TWO) TIMES DAILY WITH A MEAL. 120 tablet 1  . ondansetron (ZOFRAN) 8 MG tablet Take 1 tablet (8 mg total) by mouth 2 (two) times daily as needed. Start on the third day after chemotherapy. 30 tablet 1  . prochlorperazine (COMPAZINE) 10 MG tablet Take 1 tablet (10 mg total) by mouth every 6 (six) hours as needed (Nausea or vomiting). 30 tablet 1  . venlafaxine XR (EFFEXOR-XR) 150 MG 24 hr capsule TAKE 1 CAPSULE (150 MG TOTAL) BY MOUTH DAILY WITH BREAKFAST. 90 capsule 1   No current facility-administered medications for this visit.     Review of Systems Review of Systems  Constitutional: Negative.   Respiratory: Negative.   Cardiovascular: Negative.     Blood pressure 102/70, pulse 89, resp. rate 14, height 5\' 5"  (1.651 m), weight 188 lb  (85.3 kg).  Physical Exam Physical Exam  Constitutional: She is oriented to person, place, and time. She appears well-developed and well-nourished.  HENT:  Mouth/Throat: Oropharynx is clear and moist.  Eyes: Conjunctivae are normal. No scleral icterus.  Neck: Neck supple.  Cardiovascular: Normal rate, regular rhythm and normal heart sounds.  Pulmonary/Chest: Effort normal and breath sounds normal. Left breast exhibits no inverted nipple, no mass, no nipple discharge, no skin change and no tenderness.    Lymphadenopathy:    She has no cervical adenopathy.    She has no axillary adenopathy.  Neurological: She is alert and oriented to person, place, and time.  Skin: Skin is warm and dry.  Psychiatric: Her behavior is normal.    Data Reviewed Ultrasound examination of the left breast mass in the 5 o'clock position, 2 cm from the nipple shows a decrease in size to 1.98 x 3.60 x 3.92. Prior to initiation of neoadjuvant chemotherapy this area measured 2.38 x 3.99 x 5.12 cm. This would represent a 43% reduction in volume. The left axillary lymph node remains visible at the level of the manubrium measuring 1.43 x 1.75 x 2.27.  Assessment    Good response to neoadjuvant chemotherapy.    Plan  The patient will begin her Taxol treatments in the near future.  Reassessment towards the end of that 12-week cycle.   HPI, Physical Exam, Assessment and Plan have been scribed under the direction and in the presence of Robert Bellow, MD. Carolyn Fetch, RN  I have completed the exam and reviewed the above documentation for accuracy and completeness.  I agree with the above.  Haematologist has been used and any errors in dictation or transcription are unintentional.  Hervey Ard, M.D., F.A.C.S.  [name] Tj Kitchings 09/13/2017, 5:58 PM

## 2017-09-12 NOTE — Patient Instructions (Addendum)
The patient is aware to call back for any questions or new concerns.  

## 2017-09-13 NOTE — Progress Notes (Signed)
[facility] day:  09/14/2017  Chief Complaint: LAWRENCIA Roy is a 52 y.o. female with clinical stage IIIA left breast cancer who is seen for assessment prior to cycle #1 neoadjuvant Taxol.  HPI:  The patient was last seen in the medical oncology clinic on 08/31/2017.  At that time, patient doing well. She was tolerating treatment remarkably well. She did note some fatigue for the first week following treatment. Minimal nausea that has been well controlled with the prescribed interventions. She complained of diarrhea and abdominal cramping. She was experiencing LEFT axillary tenderness and vision changes. Described vision has having a "haze of oil" over her eyes. Eating well; weight down 2 pounds. Continued to struggle with glycemic control despite medication adjustments; being followed by endocrinology. Exam was stable. WBC 8900 ([place] 7100). Glucose 178. She received cycle #4 AC chemotherapy.    She was seen in follow up consult on Dr. Hervey Ard. Notes reviewed. Mention was made of persistently elevated blood sugars, with morning preprandial readings being over 200. Surgeon noted that LEFT breast tumor was much less prominent to palpation. Follow up ultrasound showed a 1.98 x 3.60 x 3.92 cm (previously 2.38 x 3.99 x 5.12 cm) mass at the 5 o'clock position, representing a 43% overall reduction in volume. LEFT axillary lymph node visible at the level of the manubrium that measured 1.43 x 1.75 x 2.27 cm. Surgery plans to see patient in follow up towards the end of her 12 week Taxol regimen.   In the interim, patient is fatigued. She denies increased shortness or peripheral edema. She has been having issues with constipation. She denies nausea, vomiting, and fevers. She has "tenderness" in her mouth. Patient denies that she has experienced any B symptoms. She denies any interval infections.   Patient advises that she is eating "ok". She is only eating about  1/4th of her meals. Weight today is 186 lb 3.2 oz (84.5 kg), which compared to her last visit to the clinic, represents a stable weight.   Patient denies pain in the clinic today.   Past Medical History:  Diagnosis Date  . Cancer (HCC)    BREAST  . Depression   . Diabetes mellitus without complication ([place]) 6237  . Endometriosis   . Family history of breast cancer   . Hyperlipidemia   . Hypertension   . Ovarian mass   . Pneumonia     Past Surgical History:  Procedure Laterality Date  . AXILLARY LYMPH NODE BIOPSY Left 07/14/2017   Procedure: INSERTION GEL MARK CLIP LEFT AXILLA;  Surgeon: Robert Bellow, MD;  Location: [facility] ORS;  Service: General;  Laterality: Left;  . OOPHORECTOMY    . PORTACATH PLACEMENT Right 07/14/2017   Procedure: INSERTION PORT-A-CATH;  Surgeon: Robert Bellow, MD;  Location: [facility] ORS;  Service: General;  Laterality: Right;  . TUBAL LIGATION      Family History  Problem Relation Age of Onset  . Other Father        No info about father or paternal relatives  . Diabetes Brother   . Pancreatitis Brother   . Prostate cancer Brother 73       currently 8 / maternal half-brother  . Breast cancer Maternal Grandmother 40       deceased 82s  . Breast cancer Maternal Aunt 65       currently 15  . Breast cancer Other 78       mother's sister; deceased 80  .  Breast cancer Other        mother's sister; age at dx unknown    Social History:  reports that she has been smoking cigarettes. She has a 5.50 pack-year smoking history. She has quit using smokeless tobacco.  Her smokeless tobacco use included snuff. She reports that she does not drink alcohol or use drugs.  She smokes 0.5-1 ppd x 11 years. She works at [employer] in [place].  Patient denies known exposures to radiation on toxins. Her fiance is Judee Clara.  The patient is alone today.  Allergies:  Allergies  Allergen Reactions  . Aspirin Nausea And Vomiting    Current  Medications: Current Outpatient Medications  Medication Sig Dispense Refill  . atorvastatin (LIPITOR) 40 MG tablet Take 1 tablet (40 mg total) by mouth daily. 90 tablet 3  . glucose blood (ONE TOUCH ULTRA TEST) test strip Use up to 4 times/day 100 each 12  . Insulin Glargine (BASAGLAR KWIKPEN) 100 UNIT/ML SOPN INJECT 0.24 MLS (24 UNITS TOTAL) INTO THE SKIN AT BEDTIME. 15 pen 2  . Insulin Pen Needle 32G X 4 MM MISC 1 Units by Does not apply route every morning. Pen needles 90 each 3  . INVOKANA 100 MG TABS tablet TAKE 1 TABLET (100 MG TOTAL) BY MOUTH DAILY BEFORE BREAKFAST. (Patient taking differently: 300 mg. ) 30 tablet 0  . metFORMIN (GLUCOPHAGE) 500 MG tablet TAKE 2 TABLETS (1,000 MG TOTAL) BY MOUTH 2 (TWO) TIMES DAILY WITH A MEAL. 120 tablet 1  . venlafaxine XR (EFFEXOR-XR) 150 MG 24 hr capsule TAKE 1 CAPSULE (150 MG TOTAL) BY MOUTH DAILY WITH BREAKFAST. 90 capsule 1  . albuterol (PROVENTIL HFA;VENTOLIN HFA) 108 (90 Base) MCG/ACT inhaler Inhale 2 puffs into the lungs every 6 (six) hours as needed for wheezing or shortness of breath. (Patient not taking: Reported on 09/14/2017) 1 Inhaler 0  . lidocaine-prilocaine (EMLA) cream Apply to affected area once (Patient not taking: Reported on 09/14/2017) 30 g 3  . Melatonin 10 MG TABS Take 20 mg by mouth at bedtime as needed (sleep).    . ondansetron (ZOFRAN) 8 MG tablet Take 1 tablet (8 mg total) by mouth 2 (two) times daily as needed. Start on the third day after chemotherapy. (Patient not taking: Reported on 09/14/2017) 30 tablet 1  . prochlorperazine (COMPAZINE) 10 MG tablet Take 1 tablet (10 mg total) by mouth every 6 (six) hours as needed (Nausea or vomiting). (Patient not taking: Reported on 09/14/2017) 30 tablet 1   No current facility-administered medications for this visit.    Facility-Administered Medications Ordered in Other Visits  Medication Dose Route Frequency Provider Last Rate Last Dose  . heparin lock flush 100 unit/mL  500 Units  Intravenous Once Cammeron Greis C, MD      . sodium chloride flush (NS) 0.9 % injection 10 mL  10 mL Intravenous PRN Lequita Asal, MD   10 mL at 09/14/17 0818    Review of Systems  Constitutional: Positive for malaise/fatigue. Negative for diaphoresis, fever and weight loss (weight stable).       Tired x 2 weeks.  HENT: Negative.   Eyes: Positive for blurred vision (watering). Negative for pain and redness.  Respiratory: Positive for cough (smoker) and shortness of breath (exertional). Negative for hemoptysis and sputum production.   Cardiovascular: Negative.  Negative for chest pain, palpitations, orthopnea, leg swelling and PND.  Gastrointestinal: Positive for constipation and nausea (controlled). Negative for abdominal pain, blood in stool, diarrhea, melena  and vomiting.  Genitourinary: Negative.  Negative for dysuria, frequency, hematuria and urgency.  Musculoskeletal: Positive for joint pain (BILATERAL knees). Negative for back pain, falls and myalgias.  Skin: Negative.  Negative for itching and rash.  Neurological: Positive for headaches (PMH (+) for migraines). Negative for dizziness, tremors and weakness.  Endo/Heme/Allergies: Does not bruise/bleed easily.       T2DM; poor control - followed by endocrinology. Denies neuropathy.   Psychiatric/Behavioral: Negative for depression, memory loss and suicidal ideas. The patient has insomnia. The patient is not nervous/anxious.   All other systems reviewed and are negative.  Performance status (ECOG): 1 - Symptomatic but completely ambulatory  Vital signs BP 105/69 (BP Location: Right Arm, Patient Position: Sitting)   Pulse 91   Temp 97.9 F (36.6 C)   Resp 18   Ht _0  (1.651 m)   Wt 186 lb 3.2 oz (84.5 kg)   LMP  (LMP Unknown)   SpO2 97%   BMI 30.99 kg/m   Physical Exam  Constitutional: She is oriented to person, place, and time and well-developed, well-nourished, and in no distress.  HENT:  Head: Normocephalic and  atraumatic. Hair is abnormal (chemotherapy induced allopecia).  Alopecia.  Eyes: Pupils are equal, round, and reactive to light. EOM are normal. No scleral icterus.  Hazel eyes.  Neck: Normal range of motion. Neck supple. No tracheal deviation present. No thyromegaly present.  Cardiovascular: Normal rate, regular rhythm and normal heart sounds. Exam reveals no gallop and no friction rub.  No murmur heard. Pulmonary/Chest: Effort normal and breath sounds normal. No respiratory distress. She has no wheezes. She has no rales.  Abdominal: Soft. Bowel sounds are normal. She exhibits no distension. There is no tenderness.  Musculoskeletal: Normal range of motion. She exhibits no edema or tenderness.  Lymphadenopathy:    She has no cervical adenopathy.    She has no axillary adenopathy.       Right: No inguinal and no supraclavicular adenopathy present.       Left: No inguinal and no supraclavicular adenopathy present.  Neurological: She is alert and oriented to person, place, and time.  Skin: Skin is warm and dry. No rash noted. No erythema.  Psychiatric: Mood, affect and judgment normal.  Nursing note and vitals reviewed.   Infusion on 09/14/2017  Component Date Value Ref Range Status  . Magnesium 09/14/2017 2.0  1.7 - 2.4 mg/dL Final   Performed at [facility], [address], [place], [place] [address]  . Sodium 09/14/2017 138  135 - 145 mmol/L Final  . Potassium 09/14/2017 3.5  3.5 - 5.1 mmol/L Final  . Chloride 09/14/2017 106  98 - 111 mmol/L Final  . CO2 09/14/2017 23  22 - 32 mmol/L Final  . Glucose, Bld 09/14/2017 193* 70 - 99 mg/dL Final  . BUN 09/14/2017 12  6 - 20 mg/dL Final  . Creatinine, Ser 09/14/2017 0.53  0.44 - 1.00 mg/dL Final  . Calcium 09/14/2017 8.9  8.9 - 10.3 mg/dL Final  . Total Protein 09/14/2017 6.4* 6.5 - 8.1 g/dL Final  . Albumin 09/14/2017 3.6  3.5 - 5.0 g/dL Final  . AST 09/14/2017 18  15 - 41 U/L Final  . ALT 09/14/2017 19  0 - 44 U/L Final  .  Alkaline Phosphatase 09/14/2017 85  38 - 126 U/L Final  . Total Bilirubin 09/14/2017 0.6  0.3 - 1.2 mg/dL Final  . GFR calc non Af Amer 09/14/2017 >60  >60 mL/min Final  .  GFR calc Af Amer 09/14/2017 >60  >60 mL/min Final   Comment: (NOTE) The eGFR has been calculated using the CKD EPI equation. This calculation has not been validated in all clinical situations. eGFR's persistently <60 mL/min signify possible Chronic Kidney Disease.   Georgiann Hahn gap 09/14/2017 9  5 - 15 Final   Performed at [facility], [address] [place], [place] [address]  . WBC 09/14/2017 8.5  3.6 - 11.0 K/uL Final  . RBC 09/14/2017 3.75* 3.80 - 5.20 MIL/uL Final  . Hemoglobin 09/14/2017 12.5  12.0 - 16.0 g/dL Final  . HCT 09/14/2017 35.9  35.0 - 47.0 % Final  . MCV 09/14/2017 95.8  80.0 - 100.0 fL Final  . MCH 09/14/2017 33.3  26.0 - 34.0 pg Final  . MCHC 09/14/2017 34.8  32.0 - 36.0 g/dL Final  . RDW 09/14/2017 17.3* 11.5 - 14.5 % Final  . Platelets 09/14/2017 171  150 - 440 K/uL Final  . Neutrophils Relative % 09/14/2017 80  % Final  . Neutro Abs 09/14/2017 6.8* 1.4 - 6.5 K/uL Final  . Lymphocytes Relative 09/14/2017 12  % Final  . Lymphs Abs 09/14/2017 1.0  1.0 - 3.6 K/uL Final  . Monocytes Relative 09/14/2017 6  % Final  . Monocytes Absolute 09/14/2017 0.5  0.2 - 0.9 K/uL Final  . Eosinophils Relative 09/14/2017 0  % Final  . Eosinophils Absolute 09/14/2017 0.0  0 - 0.7 K/uL Final  . Basophils Relative 09/14/2017 2  % Final  . Basophils Absolute 09/14/2017 0.1  0 - 0.1 K/uL Final   Performed at [facility], [address], [place], [place] [address]    Assessment:  Carolyn Roy is a 52 y.o. female with clinical stage IIIA (T3N1Mx) left breast cancer s/p biopsy on 06/14/2017.  Pathology revealed grade III invasive ductal carcinoma.  Axillary FNA revealed malignant cells c/w metastatic carcinoma. Tumor was ER + (90%), PR + (30%), Her2/neu - and Ki67 70%.  CA27.29 was 7.8 on  06/14/2017.  Bilateral mammogram on 06/12/2017 revealed a 3.5 cm irregular mass with associated architectural distortion within the retroareolar left breast centered at the 3-4 o'clock axis.  There was probable surrounding satellite masses extending to overall measurement of 5.5 cm.  There were no masses in the right breast.  Utrasound on 06/14/2017 revealed a 2.4 x 4.0 x 5.1 cm hypoechoic mass in the retroareolar area.  The mass extended to the pectoralis fascia.  There was a prominent lymph node measuring 1.06 x 1.32 x 1.33 cm with cortical thickening.   Echo on 06/28/2017 revealed an EF of 60-65%  Chest, abdomen, and pelvis CT on 06/29/2017 revealed a LEFT breast mass measuring approximately 3.7 x 4.4 x 4.2 cm. There were borderline enlarged LEFT axillary and left internal mammary lymph nodes.  There was an infiltrative heterogeneously enhancing mass in the cervical region extending into the lower uterine segment that was concerning for a primary cervical carcinoma.  PET scan on 07/17/2017 revealed hypermetabolic central left breast mass (SUV 8.3) compatible with malignancy.  There was subtle accentuated activity in a small left axillary lymph node, but below background blood pool activity.  The cervical prominence noted on recent CT was not appreciably hypermetabolic.  Cervical biopsy on 07/05/2017 revealed an inflamed polyp with tubal metaplasia and focal grandular atypia.  There was no evidence of malignancy.  Breast ultrasound on 09/12/2017 revealed an excellent response to neoadjuvant chemotherapy [facility]). Follow up ultrasound showed a 1.98 x 3.60 x  3.92 cm (previously 2.38 x 3.99 x 5.12 cm) mass at the 5 o'clock position, representing a 43% overall reduction in volume. LEFT axillary lymph node visible at the level of the manubrium that measured 1.43 x 1.75 x 2.27 cm.   She received 4 cycles of AC with Neulasta support (07/20/2017 - 08/31/2017).  She has a family history of breast cancer.  She  is premenopausal.  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   Symptomatically, patient is fatigued. She notes that she has been more constipated with this cycle. Her appetite is decreased, however her weight is stable. No nausea or vomiting. She denies neuropathy.  Exam is stable.  WBC 8500 ([place] 6800).  Blood sugar 193.  Renal and hepatic function normal.  Plan: 1. Labs today: CBC with diff, CMP, Mg 2. Breast cancer - treatment ongoing  Doing well. Has been seen in follow up by surgery. Tumor reduced in size (43% volume reduction) following 4 cycles of neoadjuvant AC chemotherapy.   LEFT breast mass now measures 1.98 x 3.60 x 3.92 cm (previously 2.38 x 3.99 x 5.12 cm).    LEFT axillary lymph node visible at the level of the manubrium measures 1.43 x 1.75 x 2.27 cm.   Review plans for continued neoadjuvant treatment.   Patient to receive weekly paclitaxel x 12 weeks. Side effects reviewed in detail and questions fielded.   Will follow up with Dr. Bary Castilla after week 6 Taxol regimen for reevaluation.   Patient verbalizes understanding of plan of care as it stands at this time. She wishes to proceed with cycle #1 paclitaxel today as planned.   Labs reviewed. Blood counts stable and adequate enough for treatment. Will proceed with cycle #1 paclitaxel.  Discuss symptom management.  Patient has antiemetics and pain medications at home to use on a PRN basis. Patient  advising that the prescribed interventions are adequate at this point. Continue all medications as previously prescribed.  3.  HYPERglycemia - uncontrolled  Glucose is 193 today in clinic. Continues to work on lifestyle modifications and medications. She remains on Metformin 1000 mg BID, Invokana 300 mg, and Basaglar 24 units at bedtime. She has SSI to use with meals. Of note,  patient to receive IV dexamethasone with her treatment today. Discussed need for pre-prandial CBG monitoring at home, with appropriate SSI coverage as needed. She is being followed by endocrinology. 4. RTC in 1 week for MD assessment, labs (CBC with diff, CMP, Mg), and week #2 paclitaxel.    Honor Loh, NP  09/14/2017, 9:28 AM   I saw and evaluated the patient, participating in the key portions of the service and reviewing pertinent diagnostic studies and records.  I reviewed the nurse practitioner's note and agree with the findings and the plan.  The assessment and plan were discussed with the patient.  Multiple questions were asked by the patient and answered.   Nolon Stalls, MD 09/14/2017,9:28 AM

## 2017-09-14 ENCOUNTER — Inpatient Hospital Stay (HOSPITAL_BASED_OUTPATIENT_CLINIC_OR_DEPARTMENT_OTHER): Payer: Managed Care, Other (non HMO) | Admitting: Hematology and Oncology

## 2017-09-14 ENCOUNTER — Encounter: Payer: Self-pay | Admitting: Hematology and Oncology

## 2017-09-14 ENCOUNTER — Inpatient Hospital Stay: Payer: Managed Care, Other (non HMO)

## 2017-09-14 ENCOUNTER — Inpatient Hospital Stay: Payer: Managed Care, Other (non HMO) | Attending: Hematology and Oncology

## 2017-09-14 ENCOUNTER — Other Ambulatory Visit: Payer: Self-pay | Admitting: Hematology and Oncology

## 2017-09-14 VITALS — BP 105/69 | HR 91 | Temp 97.9°F | Resp 18 | Ht 65.0 in | Wt 186.2 lb

## 2017-09-14 VITALS — BP 97/67 | HR 76 | Resp 20

## 2017-09-14 DIAGNOSIS — Z794 Long term (current) use of insulin: Secondary | ICD-10-CM | POA: Diagnosis not present

## 2017-09-14 DIAGNOSIS — Z79899 Other long term (current) drug therapy: Secondary | ICD-10-CM | POA: Insufficient documentation

## 2017-09-14 DIAGNOSIS — K5900 Constipation, unspecified: Secondary | ICD-10-CM | POA: Diagnosis not present

## 2017-09-14 DIAGNOSIS — F17210 Nicotine dependence, cigarettes, uncomplicated: Secondary | ICD-10-CM

## 2017-09-14 DIAGNOSIS — E1165 Type 2 diabetes mellitus with hyperglycemia: Secondary | ICD-10-CM | POA: Diagnosis not present

## 2017-09-14 DIAGNOSIS — L658 Other specified nonscarring hair loss: Secondary | ICD-10-CM | POA: Insufficient documentation

## 2017-09-14 DIAGNOSIS — Z5111 Encounter for antineoplastic chemotherapy: Secondary | ICD-10-CM | POA: Insufficient documentation

## 2017-09-14 DIAGNOSIS — Z170 Estrogen receptor positive status [ER+]: Secondary | ICD-10-CM

## 2017-09-14 DIAGNOSIS — Z7984 Long term (current) use of oral hypoglycemic drugs: Secondary | ICD-10-CM | POA: Insufficient documentation

## 2017-09-14 DIAGNOSIS — C50512 Malignant neoplasm of lower-outer quadrant of left female breast: Secondary | ICD-10-CM | POA: Diagnosis present

## 2017-09-14 DIAGNOSIS — R530 Neoplastic (malignant) related fatigue: Secondary | ICD-10-CM | POA: Insufficient documentation

## 2017-09-14 DIAGNOSIS — R739 Hyperglycemia, unspecified: Secondary | ICD-10-CM

## 2017-09-14 DIAGNOSIS — C50812 Malignant neoplasm of overlapping sites of left female breast: Secondary | ICD-10-CM

## 2017-09-14 DIAGNOSIS — Z803 Family history of malignant neoplasm of breast: Secondary | ICD-10-CM

## 2017-09-14 DIAGNOSIS — R5383 Other fatigue: Secondary | ICD-10-CM

## 2017-09-14 DIAGNOSIS — G620 Drug-induced polyneuropathy: Secondary | ICD-10-CM | POA: Insufficient documentation

## 2017-09-14 LAB — COMPREHENSIVE METABOLIC PANEL
ALT: 19 U/L (ref 0–44)
AST: 18 U/L (ref 15–41)
Albumin: 3.6 g/dL (ref 3.5–5.0)
Alkaline Phosphatase: 85 U/L (ref 38–126)
Anion gap: 9 (ref 5–15)
BUN: 12 mg/dL (ref 6–20)
CO2: 23 mmol/L (ref 22–32)
Calcium: 8.9 mg/dL (ref 8.9–10.3)
Chloride: 106 mmol/L (ref 98–111)
Creatinine, Ser: 0.53 mg/dL (ref 0.44–1.00)
GFR calc Af Amer: >60 mL/min (ref >60)
GFR calc non Af Amer: >60 mL/min (ref >60)
Glucose, Bld: 193 mg/dL — ABNORMAL HIGH (ref 70–99)
Potassium: 3.5 mmol/L (ref 3.5–5.1)
Sodium: 138 mmol/L (ref 135–145)
Total Bilirubin: 0.6 mg/dL (ref 0.3–1.2)
Total Protein: 6.4 g/dL — ABNORMAL LOW (ref 6.5–8.1)

## 2017-09-14 LAB — MAGNESIUM: Magnesium: 2 mg/dL (ref 1.7–2.4)

## 2017-09-14 LAB — CBC WITH DIFFERENTIAL/PLATELET
Basophils Absolute: 0.1 10*3/uL (ref 0–0.1)
Basophils Relative: 2 %
Eosinophils Absolute: 0 10*3/uL (ref 0–0.7)
Eosinophils Relative: 0 %
HCT: 35.9 % (ref 35.0–47.0)
Hemoglobin: 12.5 g/dL (ref 12.0–16.0)
Lymphocytes Relative: 12 %
Lymphs Abs: 1 10*3/uL (ref 1.0–3.6)
MCH: 33.3 pg (ref 26.0–34.0)
MCHC: 34.8 g/dL (ref 32.0–36.0)
MCV: 95.8 fL (ref 80.0–100.0)
Monocytes Absolute: 0.5 10*3/uL (ref 0.2–0.9)
Monocytes Relative: 6 %
Neutro Abs: 6.8 10*3/uL — ABNORMAL HIGH (ref 1.4–6.5)
Neutrophils Relative %: 80 %
Platelets: 171 10*3/uL (ref 150–440)
RBC: 3.75 MIL/uL — ABNORMAL LOW (ref 3.80–5.20)
RDW: 17.3 % — ABNORMAL HIGH (ref 11.5–14.5)
WBC: 8.5 10*3/uL (ref 3.6–11.0)

## 2017-09-14 MED ORDER — PACLITAXEL CHEMO INJECTION 300 MG/50ML
80.0000 mg/m2 | Freq: Once | INTRAVENOUS | Status: AC
  Administered 2017-09-14: 162 mg via INTRAVENOUS
  Filled 2017-09-14: qty 27

## 2017-09-14 MED ORDER — SODIUM CHLORIDE 0.9 % IV SOLN
20.0000 mg | Freq: Once | INTRAVENOUS | Status: AC
  Administered 2017-09-14: 20 mg via INTRAVENOUS
  Filled 2017-09-14: qty 2

## 2017-09-14 MED ORDER — SODIUM CHLORIDE 0.9 % IV SOLN
Freq: Once | INTRAVENOUS | Status: AC
  Administered 2017-09-14: 10:00:00 via INTRAVENOUS
  Filled 2017-09-14: qty 1000

## 2017-09-14 MED ORDER — DIPHENHYDRAMINE HCL 50 MG/ML IJ SOLN
50.0000 mg | Freq: Once | INTRAMUSCULAR | Status: AC
  Administered 2017-09-14: 50 mg via INTRAVENOUS
  Filled 2017-09-14: qty 1

## 2017-09-14 MED ORDER — HEPARIN SOD (PORK) LOCK FLUSH 100 UNIT/ML IV SOLN
500.0000 [IU] | Freq: Once | INTRAVENOUS | Status: AC | PRN
  Administered 2017-09-14: 500 [IU]

## 2017-09-14 MED ORDER — SODIUM CHLORIDE 0.9% FLUSH
10.0000 mL | INTRAVENOUS | Status: DC | PRN
  Administered 2017-09-14: 10 mL via INTRAVENOUS
  Filled 2017-09-14: qty 10

## 2017-09-14 MED ORDER — HEPARIN SOD (PORK) LOCK FLUSH 100 UNIT/ML IV SOLN
500.0000 [IU] | Freq: Once | INTRAVENOUS | Status: DC
  Filled 2017-09-14: qty 5

## 2017-09-14 MED ORDER — FAMOTIDINE IN NACL 20-0.9 MG/50ML-% IV SOLN
20.0000 mg | Freq: Once | INTRAVENOUS | Status: AC
  Administered 2017-09-14: 20 mg via INTRAVENOUS
  Filled 2017-09-14: qty 50

## 2017-09-14 NOTE — Progress Notes (Signed)
No new changes noted today 

## 2017-09-21 ENCOUNTER — Inpatient Hospital Stay: Payer: Managed Care, Other (non HMO)

## 2017-09-21 ENCOUNTER — Other Ambulatory Visit: Payer: Self-pay | Admitting: Oncology

## 2017-09-21 ENCOUNTER — Encounter: Payer: Self-pay | Admitting: Hematology and Oncology

## 2017-09-21 ENCOUNTER — Inpatient Hospital Stay (HOSPITAL_BASED_OUTPATIENT_CLINIC_OR_DEPARTMENT_OTHER): Payer: Managed Care, Other (non HMO) | Admitting: Urgent Care

## 2017-09-21 VITALS — BP 101/67 | HR 76 | Resp 20

## 2017-09-21 DIAGNOSIS — R5382 Chronic fatigue, unspecified: Secondary | ICD-10-CM

## 2017-09-21 DIAGNOSIS — G620 Drug-induced polyneuropathy: Secondary | ICD-10-CM

## 2017-09-21 DIAGNOSIS — R530 Neoplastic (malignant) related fatigue: Secondary | ICD-10-CM

## 2017-09-21 DIAGNOSIS — Z5111 Encounter for antineoplastic chemotherapy: Secondary | ICD-10-CM

## 2017-09-21 DIAGNOSIS — K5900 Constipation, unspecified: Secondary | ICD-10-CM

## 2017-09-21 DIAGNOSIS — C50512 Malignant neoplasm of lower-outer quadrant of left female breast: Secondary | ICD-10-CM

## 2017-09-21 DIAGNOSIS — C50812 Malignant neoplasm of overlapping sites of left female breast: Secondary | ICD-10-CM

## 2017-09-21 DIAGNOSIS — Z7984 Long term (current) use of oral hypoglycemic drugs: Secondary | ICD-10-CM

## 2017-09-21 DIAGNOSIS — T451X5A Adverse effect of antineoplastic and immunosuppressive drugs, initial encounter: Principal | ICD-10-CM

## 2017-09-21 DIAGNOSIS — F17210 Nicotine dependence, cigarettes, uncomplicated: Secondary | ICD-10-CM

## 2017-09-21 DIAGNOSIS — Z170 Estrogen receptor positive status [ER+]: Secondary | ICD-10-CM | POA: Diagnosis not present

## 2017-09-21 DIAGNOSIS — Z794 Long term (current) use of insulin: Secondary | ICD-10-CM

## 2017-09-21 DIAGNOSIS — Z803 Family history of malignant neoplasm of breast: Secondary | ICD-10-CM

## 2017-09-21 DIAGNOSIS — L658 Other specified nonscarring hair loss: Secondary | ICD-10-CM

## 2017-09-21 DIAGNOSIS — E1165 Type 2 diabetes mellitus with hyperglycemia: Secondary | ICD-10-CM

## 2017-09-21 DIAGNOSIS — Z79899 Other long term (current) drug therapy: Secondary | ICD-10-CM

## 2017-09-21 LAB — COMPREHENSIVE METABOLIC PANEL
ALT: 24 U/L (ref 0–44)
AST: 21 U/L (ref 15–41)
Albumin: 3.7 g/dL (ref 3.5–5.0)
Alkaline Phosphatase: 69 U/L (ref 38–126)
Anion gap: 10 (ref 5–15)
BUN: 10 mg/dL (ref 6–20)
CO2: 23 mmol/L (ref 22–32)
Calcium: 8.8 mg/dL — ABNORMAL LOW (ref 8.9–10.3)
Chloride: 108 mmol/L (ref 98–111)
Creatinine, Ser: 0.41 mg/dL — ABNORMAL LOW (ref 0.44–1.00)
GFR calc Af Amer: >60 mL/min (ref >60)
GFR calc non Af Amer: >60 mL/min (ref >60)
Glucose, Bld: 164 mg/dL — ABNORMAL HIGH (ref 70–99)
Potassium: 3.9 mmol/L (ref 3.5–5.1)
Sodium: 141 mmol/L (ref 135–145)
Total Bilirubin: 0.5 mg/dL (ref 0.3–1.2)
Total Protein: 6.6 g/dL (ref 6.5–8.1)

## 2017-09-21 LAB — CBC WITH DIFFERENTIAL/PLATELET
Basophils Absolute: 0.1 10*3/uL (ref 0–0.1)
Basophils Relative: 1 %
Eosinophils Absolute: 0 10*3/uL (ref 0–0.7)
Eosinophils Relative: 0 %
HCT: 36 % (ref 35.0–47.0)
Hemoglobin: 12.3 g/dL (ref 12.0–16.0)
Lymphocytes Relative: 14 %
Lymphs Abs: 0.8 10*3/uL — ABNORMAL LOW (ref 1.0–3.6)
MCH: 33.2 pg (ref 26.0–34.0)
MCHC: 34.3 g/dL (ref 32.0–36.0)
MCV: 96.7 fL (ref 80.0–100.0)
Monocytes Absolute: 0.4 10*3/uL (ref 0.2–0.9)
Monocytes Relative: 6 %
Neutro Abs: 4.5 10*3/uL (ref 1.4–6.5)
Neutrophils Relative %: 79 %
Platelets: 280 10*3/uL (ref 150–440)
RBC: 3.72 MIL/uL — ABNORMAL LOW (ref 3.80–5.20)
RDW: 17.5 % — ABNORMAL HIGH (ref 11.5–14.5)
WBC: 5.8 10*3/uL (ref 3.6–11.0)

## 2017-09-21 LAB — MAGNESIUM: Magnesium: 2.1 mg/dL (ref 1.7–2.4)

## 2017-09-21 MED ORDER — DEXAMETHASONE SODIUM PHOSPHATE 100 MG/10ML IJ SOLN
20.0000 mg | Freq: Once | INTRAMUSCULAR | Status: AC
  Administered 2017-09-21: 20 mg via INTRAVENOUS
  Filled 2017-09-21: qty 2

## 2017-09-21 MED ORDER — HEPARIN SOD (PORK) LOCK FLUSH 100 UNIT/ML IV SOLN
500.0000 [IU] | Freq: Once | INTRAVENOUS | Status: DC
  Filled 2017-09-21: qty 5

## 2017-09-21 MED ORDER — GABAPENTIN 100 MG PO CAPS: 100.0000 mg | ORAL_CAPSULE | Freq: Every day | ORAL | 0 refills | Status: DC

## 2017-09-21 MED ORDER — LIDOCAINE-PRILOCAINE 2.5-2.5 % EX CREA: TOPICAL_CREAM | CUTANEOUS | 3 refills | Status: DC

## 2017-09-21 MED ORDER — DIPHENHYDRAMINE HCL 50 MG/ML IJ SOLN
50.0000 mg | Freq: Once | INTRAMUSCULAR | Status: AC
  Administered 2017-09-21: 50 mg via INTRAVENOUS
  Filled 2017-09-21: qty 1

## 2017-09-21 MED ORDER — HEPARIN SOD (PORK) LOCK FLUSH 100 UNIT/ML IV SOLN
500.0000 [IU] | Freq: Once | INTRAVENOUS | Status: AC | PRN
  Administered 2017-09-21: 500 [IU]

## 2017-09-21 MED ORDER — FAMOTIDINE IN NACL 20-0.9 MG/50ML-% IV SOLN
20.0000 mg | Freq: Once | INTRAVENOUS | Status: AC
Start: 2017-09-21 — End: 2017-09-21
  Administered 2017-09-21: 20 mg via INTRAVENOUS
  Filled 2017-09-21: qty 50

## 2017-09-21 MED ORDER — SODIUM CHLORIDE 0.9 % IV SOLN
80.0000 mg/m2 | Freq: Once | INTRAVENOUS | Status: AC
  Administered 2017-09-21: 162 mg via INTRAVENOUS
  Filled 2017-09-21: qty 27

## 2017-09-21 MED ORDER — SODIUM CHLORIDE 0.9% FLUSH
10.0000 mL | Freq: Once | INTRAVENOUS | Status: AC
  Administered 2017-09-21: 10 mL via INTRAVENOUS
  Filled 2017-09-21: qty 10

## 2017-09-21 MED ORDER — SODIUM CHLORIDE 0.9 % IV SOLN
Freq: Once | INTRAVENOUS | Status: AC
  Administered 2017-09-21: 10:00:00 via INTRAVENOUS
  Filled 2017-09-21: qty 1000

## 2017-09-21 NOTE — Progress Notes (Signed)
[facility] day:  09/21/2017  Chief Complaint: Carolyn Roy is a 52 y.o. female with clinical stage IIIA left breast cancer who is seen for assessment prior to cycle #2 neoadjuvant Taxol.  HPI:  The patient was last seen in the medical oncology clinic on 09/14/2017.  At that time, patient complained of fatigue.  She denied nausea, vomiting, and changes to her bowel habits.  She described a "tenderness" in her mouth.  No B symptoms or interval infections.  Appetite decreased; weight stable.  Exam was stable.  WBC 8500 ([place] 6800).  Blood sugar elevated 193.  She received cycle #1 neoadjuvant paclitaxel.  In the interim, patient has been doing well overall.  She tolerated her initial week of neoadjuvant paclitaxel well.  Patient notes that for the first few days following her treatment, she had increased difficulties with her sleep.  Patient denies nausea and vomiting, however she previous diarrhea 1 day following her treatment.  Diarrhea has since resolved without pharmacological intervention.   Patient describes muscle aches in her lower legs night.  She notes that her legs are restless and "tingly".  Patient describes a very minor soreness to the tips of her fingers.  While there is pain in her fingertips, she denies that this symptom imposes any limitation to her day-to-day function. She denies any shortness of breath or edema in her legs. Patient denies that she has experienced any B symptoms. She denies any interval infections.   Patient advises that her appetite is about "50% normal". Weight today is 186 pounds, which compared to her last visit to the clinic, represents a 2 pound decrease.  Patient denies pain in the clinic today.  Past Medical History:  Diagnosis Date  . Cancer (HCC)    BREAST  . Depression   . Diabetes mellitus without complication ([place]) 7014  . Endometriosis   . Family history of breast cancer   . Hyperlipidemia   .  Hypertension   . Ovarian mass   . Pneumonia     Past Surgical History:  Procedure Laterality Date  . AXILLARY LYMPH NODE BIOPSY Left 07/14/2017   Procedure: INSERTION GEL MARK CLIP LEFT AXILLA;  Surgeon: Robert Bellow, MD;  Location: [facility] ORS;  Service: General;  Laterality: Left;  . OOPHORECTOMY    . PORTACATH PLACEMENT Right 07/14/2017   Procedure: INSERTION PORT-A-CATH;  Surgeon: Robert Bellow, MD;  Location: [facility] ORS;  Service: General;  Laterality: Right;  . TUBAL LIGATION      Family History  Problem Relation Age of Onset  . Other Father        No info about father or paternal relatives  . Diabetes Brother   . Pancreatitis Brother   . Prostate cancer Brother 9       currently 89 / maternal half-brother  . Breast cancer Maternal Grandmother 40       deceased 52s  . Breast cancer Maternal Aunt 65       currently 29  . Breast cancer Other 59       mother's sister; deceased 69  . Breast cancer Other        mother's sister; age at dx unknown    Social History:  reports that she has been smoking cigarettes. She has a 5.50 pack-year smoking history. She has quit using smokeless tobacco.  Her smokeless tobacco use included snuff. She reports that she does not drink alcohol or use drugs.  She smokes  0.5-1 ppd x 11 years. She works at [employer] in [place].  Patient denies known exposures to radiation on toxins. Her fiance is Judee Clara.  The patient is accompanied by her fianc , Nathaneil Canary, today.  Allergies:  Allergies  Allergen Reactions  . Aspirin Nausea And Vomiting    Current Medications: Current Outpatient Medications  Medication Sig Dispense Refill  . atorvastatin (LIPITOR) 40 MG tablet Take 1 tablet (40 mg total) by mouth daily. 90 tablet 3  . glucose blood (ONE TOUCH ULTRA TEST) test strip Use up to 4 times/day 100 each 12  . Insulin Glargine (BASAGLAR KWIKPEN) 100 UNIT/ML SOPN INJECT 0.24 MLS (24 UNITS TOTAL) INTO THE SKIN AT BEDTIME. 15  pen 2  . Insulin Pen Needle 32G X 4 MM MISC 1 Units by Does not apply route every morning. Pen needles 90 each 3  . INVOKANA 100 MG TABS tablet TAKE 1 TABLET (100 MG TOTAL) BY MOUTH DAILY BEFORE BREAKFAST. (Patient taking differently: 300 mg. ) 30 tablet 0  . lidocaine-prilocaine (EMLA) cream Apply to affected area once 30 g 3  . Melatonin 10 MG TABS Take 20 mg by mouth at bedtime as needed (sleep).    . metFORMIN (GLUCOPHAGE) 500 MG tablet TAKE 2 TABLETS (1,000 MG TOTAL) BY MOUTH 2 (TWO) TIMES DAILY WITH A MEAL. 120 tablet 1  . ondansetron (ZOFRAN) 8 MG tablet Take 1 tablet (8 mg total) by mouth 2 (two) times daily as needed. Start on the third day after chemotherapy. 30 tablet 1  . albuterol (PROVENTIL HFA;VENTOLIN HFA) 108 (90 Base) MCG/ACT inhaler Inhale 2 puffs into the lungs every 6 (six) hours as needed for wheezing or shortness of breath. (Patient not taking: Reported on 09/14/2017) 1 Inhaler 0  . gabapentin (NEURONTIN) 100 MG capsule Take 1 capsule (100 mg total) by mouth at bedtime. 30 capsule 0  . prochlorperazine (COMPAZINE) 10 MG tablet Take 1 tablet (10 mg total) by mouth every 6 (six) hours as needed (Nausea or vomiting). (Patient not taking: Reported on 09/14/2017) 30 tablet 1  . venlafaxine XR (EFFEXOR-XR) 150 MG 24 hr capsule TAKE 1 CAPSULE (150 MG TOTAL) BY MOUTH DAILY WITH BREAKFAST. (Patient not taking: Reported on 09/21/2017) 90 capsule 1   No current facility-administered medications for this visit.    Facility-Administered Medications Ordered in Other Visits  Medication Dose Route Frequency Provider Last Rate Last Dose  . heparin lock flush 100 unit/mL  500 Units Intravenous Once Lequita Asal, MD        Review of Systems  Constitutional: Positive for malaise/fatigue and weight loss (down 2 pound). Negative for diaphoresis and fever.       "I am doing pretty good today".  HENT: Negative.   Eyes: Negative.   Respiratory: Positive for cough (chronic - smoker) and  shortness of breath (exertional - improved). Negative for hemoptysis and sputum production.   Cardiovascular: Negative for chest pain, palpitations, orthopnea, leg swelling and PND.  Gastrointestinal: Positive for diarrhea (mild x 1 day following treatment). Negative for abdominal pain, blood in stool, constipation, melena, nausea and vomiting.  Genitourinary: Negative for dysuria, frequency, hematuria and urgency.  Musculoskeletal: Positive for joint pain (BILATERAL knees). Negative for back pain, falls and myalgias.  Skin: Negative for itching and rash.  Neurological: Positive for headaches (PMH (+) for migraines). Negative for dizziness, tremors and weakness.       Pain and "tingling" to fingertips and lower legs  Endo/Heme/Allergies: Does not bruise/bleed easily.  Diabetes - poorly controlled - followed by endocrinology.  Psychiatric/Behavioral: Negative for depression, memory loss and suicidal ideas. The patient has insomnia. The patient is not nervous/anxious.   All other systems reviewed and are negative.  Performance status (ECOG): 1 - Symptomatic but completely ambulatory  Vital signs LMP  (LMP Unknown)   Physical Exam  Constitutional: She is oriented to person, place, and time and well-developed, well-nourished, and in no distress.  HENT:  Head: Normocephalic and atraumatic. Hair is abnormal (chemotherapy induced allopecia).  Eyes: Pupils are equal, round, and reactive to light. EOM are normal. No scleral icterus.  Hazel eyes.   Neck: Normal range of motion. Neck supple. No tracheal deviation present. No thyromegaly present.  Cardiovascular: Normal rate, regular rhythm and normal heart sounds. Exam reveals no gallop and no friction rub.  No murmur heard. Pulmonary/Chest: Effort normal and breath sounds normal. No respiratory distress. She has no wheezes. She has no rales.  Abdominal: Soft. Bowel sounds are normal. She exhibits no distension. There is no tenderness.   Musculoskeletal: Normal range of motion. She exhibits no edema or tenderness.  Lymphadenopathy:    She has no cervical adenopathy.    She has no axillary adenopathy.       Right: No inguinal and no supraclavicular adenopathy present.       Left: No inguinal and no supraclavicular adenopathy present.  Neurological: She is alert and oriented to person, place, and time. She has normal motor skills and normal sensation. Gait normal.  Skin: Skin is warm and dry. No rash noted. No erythema.  Psychiatric: Mood, affect and judgment normal.  Nursing note and vitals reviewed.   Infusion on 09/21/2017  Component Date Value Ref Range Status  . Magnesium 09/21/2017 2.1  1.7 - 2.4 mg/dL Final   Performed at [facility], [address] [place], [place] [address]  . Sodium 09/21/2017 141  135 - 145 mmol/L Final  . Potassium 09/21/2017 3.9  3.5 - 5.1 mmol/L Final  . Chloride 09/21/2017 108  98 - 111 mmol/L Final  . CO2 09/21/2017 23  22 - 32 mmol/L Final  . Glucose, Bld 09/21/2017 164* 70 - 99 mg/dL Final  . BUN 09/21/2017 10  6 - 20 mg/dL Final  . Creatinine, Ser 09/21/2017 0.41* 0.44 - 1.00 mg/dL Final  . Calcium 09/21/2017 8.8* 8.9 - 10.3 mg/dL Final  . Total Protein 09/21/2017 6.6  6.5 - 8.1 g/dL Final  . Albumin 09/21/2017 3.7  3.5 - 5.0 g/dL Final  . AST 09/21/2017 21  15 - 41 U/L Final  . ALT 09/21/2017 24  0 - 44 U/L Final  . Alkaline Phosphatase 09/21/2017 69  38 - 126 U/L Final  . Total Bilirubin 09/21/2017 0.5  0.3 - 1.2 mg/dL Final  . GFR calc non Af Amer 09/21/2017 >60  >60 mL/min Final  . GFR calc Af Amer 09/21/2017 >60  >60 mL/min Final   Comment: (NOTE) The eGFR has been calculated using the CKD EPI equation. This calculation has not been validated in all clinical situations. eGFR's persistently <60 mL/min signify possible Chronic Kidney Disease.   Georgiann Hahn gap 09/21/2017 10  5 - 15 Final   Performed at [facility], [address] [place], [place] [address]   . WBC 09/21/2017 5.8  3.6 - 11.0 K/uL Final  . RBC 09/21/2017 3.72* 3.80 - 5.20 MIL/uL Final  . Hemoglobin 09/21/2017 12.3  12.0 - 16.0 g/dL Final  . HCT 09/21/2017 36.0  35.0 -  47.0 % Final  . MCV 09/21/2017 96.7  80.0 - 100.0 fL Final  . MCH 09/21/2017 33.2  26.0 - 34.0 pg Final  . MCHC 09/21/2017 34.3  32.0 - 36.0 g/dL Final  . RDW 09/21/2017 17.5* 11.5 - 14.5 % Final  . Platelets 09/21/2017 280  150 - 440 K/uL Final  . Neutrophils Relative % 09/21/2017 79  % Final  . Neutro Abs 09/21/2017 4.5  1.4 - 6.5 K/uL Final  . Lymphocytes Relative 09/21/2017 14  % Final  . Lymphs Abs 09/21/2017 0.8* 1.0 - 3.6 K/uL Final  . Monocytes Relative 09/21/2017 6  % Final  . Monocytes Absolute 09/21/2017 0.4  0.2 - 0.9 K/uL Final  . Eosinophils Relative 09/21/2017 0  % Final  . Eosinophils Absolute 09/21/2017 0.0  0 - 0.7 K/uL Final  . Basophils Relative 09/21/2017 1  % Final  . Basophils Absolute 09/21/2017 0.1  0 - 0.1 K/uL Final   Performed at [facility], [address], [place], [place] [address]    Assessment:  VERDENE CRESON is a 52 y.o. female with clinical stage IIIA (T3N1Mx) left breast cancer s/p biopsy on 06/14/2017.  Pathology revealed grade III invasive ductal carcinoma.  Axillary FNA revealed malignant cells c/w metastatic carcinoma. Tumor was ER + (90%), PR + (30%), Her2/neu - and Ki67 70%.  CA27.29 was 7.8 on 06/14/2017.  Bilateral mammogram on 06/12/2017 revealed a 3.5 cm irregular mass with associated architectural distortion within the retroareolar left breast centered at the 3-4 o'clock axis.  There was probable surrounding satellite masses extending to overall measurement of 5.5 cm.  There were no masses in the right breast.  Utrasound on 06/14/2017 revealed a 2.4 x 4.0 x 5.1 cm hypoechoic mass in the retroareolar area.  The mass extended to the pectoralis fascia.  There was a prominent lymph node measuring 1.06 x 1.32 x 1.33 cm with cortical thickening.   [place] on  06/28/2017 revealed an EF of 60-65%  Chest, abdomen, and pelvis CT on 06/29/2017 revealed a LEFT breast mass measuring approximately 3.7 x 4.4 x 4.2 cm. There were borderline enlarged LEFT axillary and left internal mammary lymph nodes.  There was an infiltrative heterogeneously enhancing mass in the cervical region extending into the lower uterine segment that was concerning for a primary cervical carcinoma.  PET scan on 07/17/2017 revealed hypermetabolic central left breast mass (SUV 8.3) compatible with malignancy.  There was subtle accentuated activity in a small left axillary lymph node, but below background blood pool activity.  The cervical prominence noted on recent CT was not appreciably hypermetabolic.  Cervical biopsy on 07/05/2017 revealed an inflamed polyp with tubal metaplasia and focal grandular atypia.  There was no evidence of malignancy.  Breast ultrasound on 09/12/2017 revealed an excellent response to neoadjuvant chemotherapy [facility]). Follow up ultrasound showed a 1.98 x 3.60 x 3.92 cm (previously 2.38 x 3.99 x 5.12 cm) mass at the 5 o'clock position, representing a 43% overall reduction in volume. LEFT axillary lymph node visible at the level of the manubrium that measured 1.43 x 1.75 x 2.27 cm.   She received 4 cycles of AC with Neulasta support (07/20/2017 - 08/31/2017). She began neoadjuvant Taxol on 09/14/2017.   She has a family history of breast cancer.  She is premenopausal.  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   Symptomatically, patient continues to  be fatigued.  She describes muscle aches and "tingling" sensations in her lower extremities.  She also complains of a "soreness" to her fingertips.  She denies having difficulty sleeping for the first few days following her treatment.  She has not had any nausea or  vomiting.  She experienced diarrhea (mild) x1 day following treatment.  Appetite is decreased, and patient has lost 2 pounds.  Exam is grossly unremarkable.  WBC 5800 ([place] 4500).  Platelets 280,000.  Blood sugar elevated at 164.  Plan: 1. Labs today: CBC with differential, CMP, magnesium 2. Breast cancer - treatment ongoing  Continues to do well overall.  Patient tolerated her initial cycle of dose dense paclitaxel well.  She denies nausea and vomiting.  She experienced diarrhea x1 day following her treatment.  Review plans for weekly dose dense paclitaxel x 12 weeks. Patient to follow up with surgery after 6 weekly treatments for repeat ultrasound imaging.   Labs reviewed. Blood counts stable and adequate enough for treatment. Will proceed with cycle #2 paclitaxel.  Discuss symptom management.  Patient has antiemetics and pain medications at home to use on a PRN basis. Patient  advising that the  prescribed interventions are adequate at this point. Continue all medications as previously prescribed.  3. Chemotherapy induced neuropathy - acute  Patient with muscle aches and a "tingling" sensation in her lower legs.  Patient has a "soreness" to the tips of her fingers.  She denies day-to-day functional limitations.  Fine motor skills remain intact.  Will try low-dose gabapentin 100 mg at bedtime. 4. Nutrition - ongoing assessment  Appetite is stable and at about "50% of normal". Weight down 2 pounds.   Encouraged protein and calorie dense food choices to prevent further weight loss.   Discussed nutritional shakes as need to supplement nutrition.  5. Hyperglycemia - ongoing  Glucose today is 168 in clinic. Continues to work with PCP and endocrinology to achieve better glycemic control.   Patient to receive IV dexamethasone with her treatment today. Discussed need for pre-prandial CBG monitoring at home, with appropriate SSI coverage as needed.  6. RTC in 1 week for labs (CBC with diff,  CMP, Mg), and week #3 paclitaxel. 7. RTC in 2 weeks for MD assessment, labs (CBC with diff, CMP, Mg) and week #4 paclitaxel.    Honor Loh, NP  09/21/2017, 9:20 AM

## 2017-09-21 NOTE — Progress Notes (Signed)
Patient states she did not sleep for 2 days after her last treatment.  Also c/o restless legs at night and feeling like her legs are "drawing up and cramping".  Also requesting refill for EMLA.

## 2017-09-28 ENCOUNTER — Other Ambulatory Visit: Payer: Self-pay | Admitting: Hematology and Oncology

## 2017-09-28 ENCOUNTER — Ambulatory Visit: Payer: Managed Care, Other (non HMO) | Admitting: Hematology and Oncology

## 2017-09-28 ENCOUNTER — Inpatient Hospital Stay: Payer: Managed Care, Other (non HMO)

## 2017-09-28 VITALS — BP 104/69 | HR 94 | Temp 97.6°F | Resp 18 | Wt 189.4 lb

## 2017-09-28 DIAGNOSIS — Z5111 Encounter for antineoplastic chemotherapy: Secondary | ICD-10-CM | POA: Diagnosis not present

## 2017-09-28 DIAGNOSIS — C50812 Malignant neoplasm of overlapping sites of left female breast: Secondary | ICD-10-CM

## 2017-09-28 LAB — COMPREHENSIVE METABOLIC PANEL
ALT: 21 U/L (ref 0–44)
AST: 19 U/L (ref 15–41)
Albumin: 3.5 g/dL (ref 3.5–5.0)
Alkaline Phosphatase: 72 U/L (ref 38–126)
Anion gap: 10 (ref 5–15)
BUN: 10 mg/dL (ref 6–20)
CO2: 24 mmol/L (ref 22–32)
Calcium: 9.2 mg/dL (ref 8.9–10.3)
Chloride: 105 mmol/L (ref 98–111)
Creatinine, Ser: 0.49 mg/dL (ref 0.44–1.00)
GFR calc Af Amer: >60 mL/min (ref >60)
GFR calc non Af Amer: >60 mL/min (ref >60)
Glucose, Bld: 285 mg/dL — ABNORMAL HIGH (ref 70–99)
Potassium: 3.9 mmol/L (ref 3.5–5.1)
Sodium: 139 mmol/L (ref 135–145)
Total Bilirubin: 0.8 mg/dL (ref 0.3–1.2)
Total Protein: 6.3 g/dL — ABNORMAL LOW (ref 6.5–8.1)

## 2017-09-28 LAB — CBC WITH DIFFERENTIAL/PLATELET
Basophils Absolute: 0.1 10*3/uL (ref 0–0.1)
Basophils Relative: 1 %
Eosinophils Absolute: 0 10*3/uL (ref 0–0.7)
Eosinophils Relative: 0 %
HCT: 35.6 % (ref 35.0–47.0)
Hemoglobin: 12.2 g/dL (ref 12.0–16.0)
Lymphocytes Relative: 11 %
Lymphs Abs: 0.6 10*3/uL — ABNORMAL LOW (ref 1.0–3.6)
MCH: 33.7 pg (ref 26.0–34.0)
MCHC: 34.3 g/dL (ref 32.0–36.0)
MCV: 98 fL (ref 80.0–100.0)
Monocytes Absolute: 0.3 10*3/uL (ref 0.2–0.9)
Monocytes Relative: 6 %
Neutro Abs: 4.8 10*3/uL (ref 1.4–6.5)
Neutrophils Relative %: 82 %
Platelets: 251 10*3/uL (ref 150–440)
RBC: 3.64 MIL/uL — ABNORMAL LOW (ref 3.80–5.20)
RDW: 17.7 % — ABNORMAL HIGH (ref 11.5–14.5)
WBC: 5.8 10*3/uL (ref 3.6–11.0)

## 2017-09-28 MED ORDER — SODIUM CHLORIDE 0.9 % IV SOLN
Freq: Once | INTRAVENOUS | Status: AC
  Administered 2017-09-28: 10:00:00 via INTRAVENOUS
  Filled 2017-09-28: qty 250

## 2017-09-28 MED ORDER — SODIUM CHLORIDE 0.9 % IV SOLN
20.0000 mg | Freq: Once | INTRAVENOUS | Status: AC
  Administered 2017-09-28: 20 mg via INTRAVENOUS
  Filled 2017-09-28: qty 2

## 2017-09-28 MED ORDER — DIPHENHYDRAMINE HCL 50 MG/ML IJ SOLN
50.0000 mg | Freq: Once | INTRAMUSCULAR | Status: AC
  Administered 2017-09-28: 50 mg via INTRAVENOUS
  Filled 2017-09-28: qty 1

## 2017-09-28 MED ORDER — SODIUM CHLORIDE 0.9 % IV SOLN
80.0000 mg/m2 | Freq: Once | INTRAVENOUS | Status: AC
  Administered 2017-09-28: 162 mg via INTRAVENOUS
  Filled 2017-09-28: qty 27

## 2017-09-28 MED ORDER — HEPARIN SOD (PORK) LOCK FLUSH 100 UNIT/ML IV SOLN
500.0000 [IU] | Freq: Once | INTRAVENOUS | Status: DC | PRN
  Filled 2017-09-28: qty 5

## 2017-09-28 MED ORDER — HEPARIN SOD (PORK) LOCK FLUSH 100 UNIT/ML IV SOLN
500.0000 [IU] | Freq: Once | INTRAVENOUS | Status: AC
  Administered 2017-09-28: 500 [IU] via INTRAVENOUS
  Filled 2017-09-28: qty 5

## 2017-09-28 MED ORDER — FAMOTIDINE IN NACL 20-0.9 MG/50ML-% IV SOLN
20.0000 mg | Freq: Once | INTRAVENOUS | Status: AC
  Administered 2017-09-28: 20 mg via INTRAVENOUS
  Filled 2017-09-28: qty 50

## 2017-09-28 MED ORDER — SODIUM CHLORIDE 0.9% FLUSH
10.0000 mL | Freq: Once | INTRAVENOUS | Status: AC
  Administered 2017-09-28: 10 mL via INTRAVENOUS
  Filled 2017-09-28: qty 10

## 2017-09-29 ENCOUNTER — Encounter: Payer: Self-pay | Admitting: Urgent Care

## 2017-10-04 ENCOUNTER — Other Ambulatory Visit: Payer: Self-pay | Admitting: *Deleted

## 2017-10-04 DIAGNOSIS — C50812 Malignant neoplasm of overlapping sites of left female breast: Secondary | ICD-10-CM

## 2017-10-05 ENCOUNTER — Encounter: Payer: Self-pay | Admitting: Hematology and Oncology

## 2017-10-05 ENCOUNTER — Telehealth: Payer: Self-pay | Admitting: *Deleted

## 2017-10-05 ENCOUNTER — Inpatient Hospital Stay: Payer: Managed Care, Other (non HMO)

## 2017-10-05 ENCOUNTER — Inpatient Hospital Stay (HOSPITAL_BASED_OUTPATIENT_CLINIC_OR_DEPARTMENT_OTHER): Payer: Managed Care, Other (non HMO) | Admitting: Hematology and Oncology

## 2017-10-05 VITALS — BP 109/76 | HR 89 | Temp 97.9°F | Resp 18 | Wt 190.5 lb

## 2017-10-05 DIAGNOSIS — C50512 Malignant neoplasm of lower-outer quadrant of left female breast: Secondary | ICD-10-CM | POA: Diagnosis not present

## 2017-10-05 DIAGNOSIS — K5900 Constipation, unspecified: Secondary | ICD-10-CM

## 2017-10-05 DIAGNOSIS — Z803 Family history of malignant neoplasm of breast: Secondary | ICD-10-CM

## 2017-10-05 DIAGNOSIS — Z794 Long term (current) use of insulin: Secondary | ICD-10-CM

## 2017-10-05 DIAGNOSIS — L658 Other specified nonscarring hair loss: Secondary | ICD-10-CM

## 2017-10-05 DIAGNOSIS — G620 Drug-induced polyneuropathy: Secondary | ICD-10-CM | POA: Diagnosis not present

## 2017-10-05 DIAGNOSIS — Z7984 Long term (current) use of oral hypoglycemic drugs: Secondary | ICD-10-CM

## 2017-10-05 DIAGNOSIS — Z5111 Encounter for antineoplastic chemotherapy: Secondary | ICD-10-CM | POA: Diagnosis not present

## 2017-10-05 DIAGNOSIS — C50812 Malignant neoplasm of overlapping sites of left female breast: Secondary | ICD-10-CM

## 2017-10-05 DIAGNOSIS — Z79899 Other long term (current) drug therapy: Secondary | ICD-10-CM

## 2017-10-05 DIAGNOSIS — E1165 Type 2 diabetes mellitus with hyperglycemia: Secondary | ICD-10-CM

## 2017-10-05 DIAGNOSIS — T451X5A Adverse effect of antineoplastic and immunosuppressive drugs, initial encounter: Secondary | ICD-10-CM

## 2017-10-05 DIAGNOSIS — F17210 Nicotine dependence, cigarettes, uncomplicated: Secondary | ICD-10-CM

## 2017-10-05 DIAGNOSIS — Z170 Estrogen receptor positive status [ER+]: Secondary | ICD-10-CM | POA: Diagnosis not present

## 2017-10-05 DIAGNOSIS — R530 Neoplastic (malignant) related fatigue: Secondary | ICD-10-CM

## 2017-10-05 LAB — CBC WITH DIFFERENTIAL/PLATELET
Basophils Absolute: 0 10*3/uL (ref 0–0.1)
Basophils Relative: 1 %
Eosinophils Absolute: 0 10*3/uL (ref 0–0.7)
Eosinophils Relative: 1 %
HCT: 34.3 % — ABNORMAL LOW (ref 35.0–47.0)
Hemoglobin: 11.9 g/dL — ABNORMAL LOW (ref 12.0–16.0)
Lymphocytes Relative: 14 %
Lymphs Abs: 0.7 10*3/uL — ABNORMAL LOW (ref 1.0–3.6)
MCH: 34.1 pg — ABNORMAL HIGH (ref 26.0–34.0)
MCHC: 34.8 g/dL (ref 32.0–36.0)
MCV: 98.1 fL (ref 80.0–100.0)
Monocytes Absolute: 0.3 10*3/uL (ref 0.2–0.9)
Monocytes Relative: 5 %
Neutro Abs: 4 10*3/uL (ref 1.4–6.5)
Neutrophils Relative %: 79 %
Platelets: 204 10*3/uL (ref 150–440)
RBC: 3.5 MIL/uL — ABNORMAL LOW (ref 3.80–5.20)
RDW: 17.7 % — ABNORMAL HIGH (ref 11.5–14.5)
WBC: 5.1 10*3/uL (ref 3.6–11.0)

## 2017-10-05 LAB — COMPREHENSIVE METABOLIC PANEL
ALT: 21 U/L (ref 0–44)
AST: 17 U/L (ref 15–41)
Albumin: 3.4 g/dL — ABNORMAL LOW (ref 3.5–5.0)
Alkaline Phosphatase: 73 U/L (ref 38–126)
Anion gap: 6 (ref 5–15)
BUN: 13 mg/dL (ref 6–20)
CO2: 25 mmol/L (ref 22–32)
Calcium: 9 mg/dL (ref 8.9–10.3)
Chloride: 106 mmol/L (ref 98–111)
Creatinine, Ser: 0.41 mg/dL — ABNORMAL LOW (ref 0.44–1.00)
GFR calc Af Amer: >60 mL/min (ref >60)
GFR calc non Af Amer: >60 mL/min (ref >60)
Glucose, Bld: 308 mg/dL — ABNORMAL HIGH (ref 70–99)
Potassium: 4 mmol/L (ref 3.5–5.1)
Sodium: 137 mmol/L (ref 135–145)
Total Bilirubin: 0.7 mg/dL (ref 0.3–1.2)
Total Protein: 6.2 g/dL — ABNORMAL LOW (ref 6.5–8.1)

## 2017-10-05 LAB — MAGNESIUM: Magnesium: 1.9 mg/dL (ref 1.7–2.4)

## 2017-10-05 MED ORDER — SODIUM CHLORIDE 0.9 % IV SOLN
Freq: Once | INTRAVENOUS | Status: AC
  Administered 2017-10-05: 10:00:00 via INTRAVENOUS
  Filled 2017-10-05: qty 250

## 2017-10-05 MED ORDER — HEPARIN SOD (PORK) LOCK FLUSH 100 UNIT/ML IV SOLN
500.0000 [IU] | Freq: Once | INTRAVENOUS | Status: AC
  Administered 2017-10-05: 500 [IU] via INTRAVENOUS
  Filled 2017-10-05: qty 5

## 2017-10-05 MED ORDER — DIPHENHYDRAMINE HCL 50 MG/ML IJ SOLN
50.0000 mg | Freq: Once | INTRAMUSCULAR | Status: AC
  Administered 2017-10-05: 50 mg via INTRAVENOUS
  Filled 2017-10-05: qty 1

## 2017-10-05 MED ORDER — SODIUM CHLORIDE 0.9 % IV SOLN
20.0000 mg | Freq: Once | INTRAVENOUS | Status: AC
  Administered 2017-10-05: 20 mg via INTRAVENOUS
  Filled 2017-10-05: qty 2

## 2017-10-05 MED ORDER — SODIUM CHLORIDE 0.9% FLUSH
10.0000 mL | INTRAVENOUS | Status: DC | PRN
  Administered 2017-10-05: 10 mL via INTRAVENOUS
  Filled 2017-10-05: qty 10

## 2017-10-05 MED ORDER — SODIUM CHLORIDE 0.9 % IV SOLN
80.0000 mg/m2 | Freq: Once | INTRAVENOUS | Status: AC
  Administered 2017-10-05: 162 mg via INTRAVENOUS
  Filled 2017-10-05: qty 27

## 2017-10-05 MED ORDER — FAMOTIDINE IN NACL 20-0.9 MG/50ML-% IV SOLN
20.0000 mg | Freq: Once | INTRAVENOUS | Status: AC
  Administered 2017-10-05: 20 mg via INTRAVENOUS
  Filled 2017-10-05: qty 50

## 2017-10-05 MED ORDER — HEPARIN SOD (PORK) LOCK FLUSH 100 UNIT/ML IV SOLN
INTRAVENOUS | Status: AC
  Filled 2017-10-05: qty 5

## 2017-10-05 NOTE — Progress Notes (Signed)
[facility] day:  10/05/2017  Chief Complaint: Carolyn Roy is a 52 y.o. female with clinical stage IIIA left breast cancer who is seen for assessment prior to cycle #4 neoadjuvant Taxol.  HPI:  The patient was last seen in the medical oncology clinic on 09/21/2017 by Honor Loh, NP.  At that time, she was fatigued.  She described muscle aches and "tingling" sensations in her lower extremities.  She also complains of a "soreness" in her fingertips.  She denied any nausea or vomiting.  She had diarrhea (mild) x1 day following treatment.  Appetite had decreased, and she had lost 2 pounds.  Exam was grossly unremarkable.  WBC was 5800 ([place] 4500).  Platelets 280,000.  She received cycle #2 Taxol.  She received cycle #3 on 09/28/2017.  During the interim, she has done well.  She notes fatigue associated with chemotherapy.  Tuesdays and Wednesdays are typically good days.  She notes poor sleep.  She denies any nausea, vomiting, or diarrhea.  She describes some cramping in her legs.  She has restless leg syndrome.  Blood sugar has been a "little high" (140-141 in AM).   Past Medical History:  Diagnosis Date  . Cancer (HCC)    BREAST  . Depression   . Diabetes mellitus without complication ([place]) 2694  . Endometriosis   . Family history of breast cancer   . Hyperlipidemia   . Hypertension   . Ovarian mass   . Pneumonia     Past Surgical History:  Procedure Laterality Date  . AXILLARY LYMPH NODE BIOPSY Left 07/14/2017   Procedure: INSERTION GEL MARK CLIP LEFT AXILLA;  Surgeon: Robert Bellow, MD;  Location: [facility] ORS;  Service: General;  Laterality: Left;  . OOPHORECTOMY    . PORTACATH PLACEMENT Right 07/14/2017   Procedure: INSERTION PORT-A-CATH;  Surgeon: Robert Bellow, MD;  Location: [facility] ORS;  Service: General;  Laterality: Right;  . TUBAL LIGATION      Family History  Problem Relation Age of Onset  . Other Father        No info  about father or paternal relatives  . Diabetes Brother   . Pancreatitis Brother   . Prostate cancer Brother 19       currently 31 / maternal half-brother  . Breast cancer Maternal Grandmother 40       deceased 28s  . Breast cancer Maternal Aunt 65       currently 37  . Breast cancer Other 61       mother's sister; deceased 34  . Breast cancer Other        mother's sister; age at dx unknown    Social History:  reports that she has been smoking cigarettes. She has a 5.50 pack-year smoking history. She has quit using smokeless tobacco.  Her smokeless tobacco use included snuff. She reports that she does not drink alcohol or use drugs.  She smokes 0.5-1 ppd x 11 years. She works at [employer] in [place].  Patient denies known exposures to radiation on toxins. Her fiance is Judee Clara.  The patient is accompanied by her fianc , Nathaneil Canary, today.  Allergies:  Allergies  Allergen Reactions  . Aspirin Nausea And Vomiting    Current Medications: Current Outpatient Medications  Medication Sig Dispense Refill  . albuterol (PROVENTIL HFA;VENTOLIN HFA) 108 (90 Base) MCG/ACT inhaler Inhale 2 puffs into the lungs every 6 (six) hours as needed for wheezing or  shortness of breath. 1 Inhaler 0  . atorvastatin (LIPITOR) 40 MG tablet Take 1 tablet (40 mg total) by mouth daily. 90 tablet 3  . glucose blood (ONE TOUCH ULTRA TEST) test strip Use up to 4 times/day 100 each 12  . Insulin Glargine (BASAGLAR KWIKPEN) 100 UNIT/ML SOPN INJECT 0.24 MLS (24 UNITS TOTAL) INTO THE SKIN AT BEDTIME. 15 pen 2  . Insulin Pen Needle 32G X 4 MM MISC 1 Units by Does not apply route every morning. Pen needles 90 each 3  . INVOKANA 100 MG TABS tablet TAKE 1 TABLET (100 MG TOTAL) BY MOUTH DAILY BEFORE BREAKFAST. (Patient taking differently: 300 mg. ) 30 tablet 0  . lidocaine-prilocaine (EMLA) cream Apply to affected area once 30 g 3  . Melatonin 10 MG TABS Take 20 mg by mouth at bedtime as needed (sleep).     . metFORMIN (GLUCOPHAGE) 500 MG tablet TAKE 2 TABLETS (1,000 MG TOTAL) BY MOUTH 2 (TWO) TIMES DAILY WITH A MEAL. 120 tablet 1  . ondansetron (ZOFRAN) 8 MG tablet Take 1 tablet (8 mg total) by mouth 2 (two) times daily as needed. Start on the third day after chemotherapy. 30 tablet 1  . gabapentin (NEURONTIN) 100 MG capsule Take 1 capsule (100 mg) in the morning, and 3 capsules (300 mg) at bedtime. 120 capsule 1  . prochlorperazine (COMPAZINE) 10 MG tablet Take 1 tablet (10 mg total) by mouth every 6 (six) hours as needed (Nausea or vomiting). (Patient not taking: Reported on 09/14/2017) 30 tablet 1  . venlafaxine XR (EFFEXOR-XR) 150 MG 24 hr capsule TAKE 1 CAPSULE (150 MG TOTAL) BY MOUTH DAILY WITH BREAKFAST. (Patient not taking: Reported on 09/21/2017) 90 capsule 1   No current facility-administered medications for this visit.     Review of Systems  Constitutional: Positive for weight loss (down 4 pound). Negative for chills, diaphoresis, fever and malaise/fatigue.       Tired.  HENT: Negative.  Negative for congestion, ear discharge, ear pain, nosebleeds, sinus pain, sore throat and tinnitus.   Eyes: Negative.  Negative for blurred vision, double vision, photophobia, pain, discharge and redness.  Respiratory: Positive for cough (chronic) and shortness of breath (exertional). Negative for hemoptysis, sputum production and wheezing.   Cardiovascular: Negative for chest pain, palpitations, orthopnea and leg swelling.  Gastrointestinal: Negative.  Negative for abdominal pain, blood in stool, constipation, diarrhea, heartburn, melena, nausea and vomiting.  Genitourinary: Negative.  Negative for dysuria, frequency, hematuria and urgency.  Musculoskeletal: Positive for joint pain (knees and hips). Negative for back pain, falls and myalgias.       Cramping in legs.  Skin: Negative.  Negative for itching and rash.  Neurological: Positive for tingling (fingertips and feet- on gabapentin). Negative for  dizziness, tremors, speech change, focal weakness, weakness and headaches (migraines).  Endo/Heme/Allergies: Does not bruise/bleed easily.       Diabetes  Psychiatric/Behavioral: Negative for memory loss and suicidal ideas. The patient has insomnia (poor sleep). The patient is not nervous/anxious.   All other systems reviewed and are negative.  Performance status (ECOG): 1 - Symptomatic but completely ambulatory  Vital signs BP 109/76 (BP Location: Right Arm, Patient Position: Sitting)   Pulse 89   Temp 97.9 F (36.6 C) (Tympanic)   Resp 18   Wt 190 lb 8 oz (86.4 kg)   LMP  (LMP Unknown)   BMI 31.70 kg/m   Physical Exam  Constitutional: She is well-developed, well-nourished, and in no distress. No distress.  HENT:  Head: Normocephalic and atraumatic. Hair is normal.  Mouth/Throat: Oropharynx is clear and moist.  Alopecia.  Eyes: Pupils are equal, round, and reactive to light. Conjunctivae and EOM are normal. No scleral icterus.  Hazel eyes.   Neck: Normal range of motion. Neck supple. No JVD present.  Cardiovascular: Normal rate, regular rhythm and normal heart sounds. Exam reveals no gallop and no friction rub.  No murmur heard. Pulmonary/Chest: Effort normal and breath sounds normal. No respiratory distress. She has no wheezes. She has no rales.  Abdominal: Soft. Bowel sounds are normal. She exhibits no distension and no mass. There is no tenderness. There is no rebound and no guarding.  Musculoskeletal: Normal range of motion. She exhibits no edema or tenderness.  Lymphadenopathy:    She has no cervical adenopathy.    She has no axillary adenopathy.       Right: No inguinal and no supraclavicular adenopathy present.       Left: No inguinal and no supraclavicular adenopathy present.  Neurological: She is alert. She has normal motor skills and normal sensation. Gait normal.  Skin: Skin is warm and dry. No rash noted. She is not diaphoretic. No erythema.  Psychiatric: Mood,  affect and judgment normal.  Nursing note and vitals reviewed.   Infusion on 10/05/2017  Component Date Value Ref Range Status  . Magnesium 10/05/2017 1.9  1.7 - 2.4 mg/dL Final   Performed at [facility], [address] [place], [place] [address]  . Sodium 10/05/2017 137  135 - 145 mmol/L Final  . Potassium 10/05/2017 4.0  3.5 - 5.1 mmol/L Final  . Chloride 10/05/2017 106  98 - 111 mmol/L Final  . CO2 10/05/2017 25  22 - 32 mmol/L Final  . Glucose, Bld 10/05/2017 308* 70 - 99 mg/dL Final  . BUN 10/05/2017 13  6 - 20 mg/dL Final  . Creatinine, Ser 10/05/2017 0.41* 0.44 - 1.00 mg/dL Final  . Calcium 10/05/2017 9.0  8.9 - 10.3 mg/dL Final  . Total Protein 10/05/2017 6.2* 6.5 - 8.1 g/dL Final  . Albumin 10/05/2017 3.4* 3.5 - 5.0 g/dL Final  . AST 10/05/2017 17  15 - 41 U/L Final  . ALT 10/05/2017 21  0 - 44 U/L Final  . Alkaline Phosphatase 10/05/2017 73  38 - 126 U/L Final  . Total Bilirubin 10/05/2017 0.7  0.3 - 1.2 mg/dL Final  . GFR calc non Af Amer 10/05/2017 >60  >60 mL/min Final  . GFR calc Af Amer 10/05/2017 >60  >60 mL/min Final   Comment: (NOTE) The eGFR has been calculated using the CKD EPI equation. This calculation has not been validated in all clinical situations. eGFR's persistently <60 mL/min signify possible Chronic Kidney Disease.   Georgiann Hahn gap 10/05/2017 6  5 - 15 Final   Performed at [facility], [address] [place], [place] [address]  . WBC 10/05/2017 5.1  3.6 - 11.0 K/uL Final  . RBC 10/05/2017 3.50* 3.80 - 5.20 MIL/uL Final  . Hemoglobin 10/05/2017 11.9* 12.0 - 16.0 g/dL Final  . HCT 10/05/2017 34.3* 35.0 - 47.0 % Final  . MCV 10/05/2017 98.1  80.0 - 100.0 fL Final  . MCH 10/05/2017 34.1* 26.0 - 34.0 pg Final  . MCHC 10/05/2017 34.8  32.0 - 36.0 g/dL Final  . RDW 10/05/2017 17.7* 11.5 - 14.5 % Final  . Platelets 10/05/2017 204  150 - 440 K/uL Final  . Neutrophils Relative % 10/05/2017 79  % Final  . Neutro  Abs 10/05/2017 4.0  1.4 -  6.5 K/uL Final  . Lymphocytes Relative 10/05/2017 14  % Final  . Lymphs Abs 10/05/2017 0.7* 1.0 - 3.6 K/uL Final  . Monocytes Relative 10/05/2017 5  % Final  . Monocytes Absolute 10/05/2017 0.3  0.2 - 0.9 K/uL Final  . Eosinophils Relative 10/05/2017 1  % Final  . Eosinophils Absolute 10/05/2017 0.0  0 - 0.7 K/uL Final  . Basophils Relative 10/05/2017 1  % Final  . Basophils Absolute 10/05/2017 0.0  0 - 0.1 K/uL Final   Performed at [facility], [address], [place], [place] [address]    Assessment:  SHALANDRIA ELSBERND is a 52 y.o. female with clinical stage IIIA (T3N1Mx) left breast cancer s/p biopsy on 06/14/2017.  Pathology revealed grade III invasive ductal carcinoma.  Axillary FNA revealed malignant cells c/w metastatic carcinoma. Tumor was ER + (90%), PR + (30%), Her2/neu - and Ki67 70%.  CA27.29 was 7.8 on 06/14/2017.  Bilateral mammogram on 06/12/2017 revealed a 3.5 cm irregular mass with associated architectural distortion within the retroareolar left breast centered at the 3-4 o'clock axis.  There was probable surrounding satellite masses extending to overall measurement of 5.5 cm.  There were no masses in the right breast.  Utrasound on 06/14/2017 revealed a 2.4 x 4.0 x 5.1 cm hypoechoic mass in the retroareolar area.  The mass extended to the pectoralis fascia.  There was a prominent lymph node measuring 1.06 x 1.32 x 1.33 cm with cortical thickening.   Echo on 06/28/2017 revealed an EF of 60-65%  Chest, abdomen, and pelvis CT on 06/29/2017 revealed a LEFT breast mass measuring approximately 3.7 x 4.4 x 4.2 cm. There were borderline enlarged LEFT axillary and left internal mammary lymph nodes.  There was an infiltrative heterogeneously enhancing mass in the cervical region extending into the lower uterine segment that was concerning for a primary cervical carcinoma.  PET scan on 07/17/2017 revealed hypermetabolic central left breast mass (SUV 8.3) compatible with malignancy.   There was subtle accentuated activity in a small left axillary lymph node, but below background blood pool activity.  The cervical prominence noted on recent CT was not appreciably hypermetabolic.  Cervical biopsy on 07/05/2017 revealed an inflamed polyp with tubal metaplasia and focal grandular atypia.  There was no evidence of malignancy.  Breast ultrasound on 09/12/2017 revealed an excellent response to neoadjuvant chemotherapy [facility]). Follow up ultrasound showed a 1.98 x 3.60 x 3.92 cm (previously 2.38 x 3.99 x 5.12 cm) mass at the 5 o'clock position, representing a 43% overall reduction in volume. LEFT axillary lymph node visible at the level of the manubrium that measured 1.43 x 1.75 x 2.27 cm.   She received 4 cycles of AC with Neulasta support (07/20/2017 - 08/31/2017). She is s/p cycle #3 neoadjuvant Taxol (09/14/2017 - 09/28/2017).   She has a family history of breast cancer.  She is premenopausal.  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   Symptomatically, she notes fatigue after chemotherapy.  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  Exam is stable.  Blood sugar is 308.  Plan: 1. Labs today: CBC with differential, CMP, magnesium 2. Breast cancer - treatment ongoing   Clinically doing well.   Labs reviewed.  Blood counts stable and adequate for treatment.   Week #4 Taxol today.   Discuss symptom  management. She has antiemetics and pain medications at home to use on a prn bases.  Interventions are adequate.    3. Chemotherapy induced neuropathy - acute Patient remains symptomatic. Increased gabapentin to 200 mg po qhs. Titrate dose based on symptoms. 4. Nutrition - ongoing assessment Appetite is fair.  Patient denies any nausea or vomiting. Weight down 4 pounds. Encourage supplemental  shakes. 5. Hyperglycemia - ongoing Blood sugar overall much improved per patient on current regimen. Request patient bring in her glucometer to calibrate with clinic readings. 6. RTC in 1 week for labs (CBC with diff, CMP, Mg), and week #5 Taxol. 7. RTC in 2 weeks for MD assessment, labs (CBC with diff, CMP, Mg), and week #6 Taxol.   Lequita Asal, MD  10/05/2017, 4:37 PM

## 2017-10-05 NOTE — Telephone Encounter (Signed)
Patient's husband went home and brought patient's glucose meter to take his wife's glucose.  Patient was high with serum glucose today in lab.  Recheck with her meter was 294.

## 2017-10-05 NOTE — Progress Notes (Signed)
Pt in for follow up, still having difficulties with eyes burning constantly.  Tried saline drops as suggested by MD but did not help. Scheduled to see Dr Ellin Mayhew on Sept 13, 2019.

## 2017-10-12 ENCOUNTER — Other Ambulatory Visit: Payer: Self-pay | Admitting: Hematology and Oncology

## 2017-10-12 ENCOUNTER — Inpatient Hospital Stay: Payer: Managed Care, Other (non HMO)

## 2017-10-12 ENCOUNTER — Inpatient Hospital Stay: Payer: Managed Care, Other (non HMO) | Attending: Hematology and Oncology

## 2017-10-12 VITALS — BP 102/67 | HR 87 | Temp 96.9°F | Resp 18

## 2017-10-12 DIAGNOSIS — N841 Polyp of cervix uteri: Secondary | ICD-10-CM | POA: Insufficient documentation

## 2017-10-12 DIAGNOSIS — C50812 Malignant neoplasm of overlapping sites of left female breast: Secondary | ICD-10-CM

## 2017-10-12 LAB — COMPREHENSIVE METABOLIC PANEL
ALT: 23 U/L (ref 0–44)
AST: 18 U/L (ref 15–41)
Albumin: 3.8 g/dL (ref 3.5–5.0)
Alkaline Phosphatase: 69 U/L (ref 38–126)
Anion gap: 8 (ref 5–15)
BUN: 10 mg/dL (ref 6–20)
CO2: 23 mmol/L (ref 22–32)
Calcium: 8.9 mg/dL (ref 8.9–10.3)
Chloride: 105 mmol/L (ref 98–111)
Creatinine, Ser: 0.47 mg/dL (ref 0.44–1.00)
GFR calc Af Amer: >60 mL/min (ref >60)
GFR calc non Af Amer: >60 mL/min (ref >60)
Glucose, Bld: 177 mg/dL — ABNORMAL HIGH (ref 70–99)
Potassium: 3.9 mmol/L (ref 3.5–5.1)
Sodium: 136 mmol/L (ref 135–145)
Total Bilirubin: 1.1 mg/dL (ref 0.3–1.2)
Total Protein: 6.7 g/dL (ref 6.5–8.1)

## 2017-10-12 LAB — CBC WITH DIFFERENTIAL/PLATELET
Basophils Absolute: 0 10*3/uL (ref 0–0.1)
Basophils Relative: 1 %
Eosinophils Absolute: 0.1 10*3/uL (ref 0–0.7)
Eosinophils Relative: 1 %
HCT: 36.9 % (ref 35.0–47.0)
Hemoglobin: 12.7 g/dL (ref 12.0–16.0)
Lymphocytes Relative: 14 %
Lymphs Abs: 0.8 10*3/uL — ABNORMAL LOW (ref 1.0–3.6)
MCH: 34 pg (ref 26.0–34.0)
MCHC: 34.4 g/dL (ref 32.0–36.0)
MCV: 98.8 fL (ref 80.0–100.0)
Monocytes Absolute: 0.3 10*3/uL (ref 0.2–0.9)
Monocytes Relative: 5 %
Neutro Abs: 4.5 10*3/uL (ref 1.4–6.5)
Neutrophils Relative %: 79 %
Platelets: 228 10*3/uL (ref 150–440)
RBC: 3.74 MIL/uL — ABNORMAL LOW (ref 3.80–5.20)
RDW: 17.3 % — ABNORMAL HIGH (ref 11.5–14.5)
WBC: 5.6 10*3/uL (ref 3.6–11.0)

## 2017-10-12 LAB — MAGNESIUM: Magnesium: 2 mg/dL (ref 1.7–2.4)

## 2017-10-12 MED ORDER — SODIUM CHLORIDE 0.9 % IV SOLN
Freq: Once | INTRAVENOUS | Status: AC
  Administered 2017-10-12: 09:00:00 via INTRAVENOUS
  Filled 2017-10-12: qty 250

## 2017-10-12 MED ORDER — HEPARIN SOD (PORK) LOCK FLUSH 100 UNIT/ML IV SOLN
500.0000 [IU] | Freq: Once | INTRAVENOUS | Status: AC
  Administered 2017-10-12: 500 [IU] via INTRAVENOUS
  Filled 2017-10-12: qty 5

## 2017-10-12 MED ORDER — SODIUM CHLORIDE 0.9 % IV SOLN
80.0000 mg/m2 | Freq: Once | INTRAVENOUS | Status: AC
  Administered 2017-10-12: 162 mg via INTRAVENOUS
  Filled 2017-10-12: qty 27

## 2017-10-12 MED ORDER — DIPHENHYDRAMINE HCL 50 MG/ML IJ SOLN
50.0000 mg | Freq: Once | INTRAMUSCULAR | Status: AC
  Administered 2017-10-12: 50 mg via INTRAVENOUS
  Filled 2017-10-12: qty 1

## 2017-10-12 MED ORDER — FAMOTIDINE IN NACL 20-0.9 MG/50ML-% IV SOLN
20.0000 mg | Freq: Once | INTRAVENOUS | Status: AC
  Administered 2017-10-12: 20 mg via INTRAVENOUS
  Filled 2017-10-12: qty 50

## 2017-10-12 MED ORDER — HEPARIN SOD (PORK) LOCK FLUSH 100 UNIT/ML IV SOLN: 500.0000 [IU] | Freq: Once | INTRAVENOUS | Status: DC | PRN

## 2017-10-12 MED ORDER — SODIUM CHLORIDE 0.9 % IV SOLN
20.0000 mg | Freq: Once | INTRAVENOUS | Status: AC
  Administered 2017-10-12: 20 mg via INTRAVENOUS
  Filled 2017-10-12: qty 2

## 2017-10-12 MED ORDER — SODIUM CHLORIDE 0.9% FLUSH
10.0000 mL | Freq: Once | INTRAVENOUS | Status: AC
  Administered 2017-10-12: 10 mL via INTRAVENOUS
  Filled 2017-10-12: qty 10

## 2017-10-13 ENCOUNTER — Other Ambulatory Visit: Payer: Self-pay | Admitting: Urgent Care

## 2017-10-18 ENCOUNTER — Other Ambulatory Visit: Payer: Self-pay

## 2017-10-18 ENCOUNTER — Ambulatory Visit: Admission: RE | Admit: 2017-10-18 | Payer: Managed Care, Other (non HMO) | Source: Ambulatory Visit

## 2017-10-18 DIAGNOSIS — C50812 Malignant neoplasm of overlapping sites of left female breast: Secondary | ICD-10-CM

## 2017-10-19 ENCOUNTER — Inpatient Hospital Stay: Payer: Managed Care, Other (non HMO)

## 2017-10-19 ENCOUNTER — Other Ambulatory Visit: Payer: Managed Care, Other (non HMO)

## 2017-10-19 ENCOUNTER — Ambulatory Visit: Payer: Managed Care, Other (non HMO)

## 2017-10-19 ENCOUNTER — Inpatient Hospital Stay (HOSPITAL_BASED_OUTPATIENT_CLINIC_OR_DEPARTMENT_OTHER): Payer: Managed Care, Other (non HMO) | Admitting: Hematology and Oncology

## 2017-10-19 ENCOUNTER — Ambulatory Visit: Payer: Managed Care, Other (non HMO) | Admitting: Hematology and Oncology

## 2017-10-19 ENCOUNTER — Encounter: Payer: Self-pay | Admitting: Hematology and Oncology

## 2017-10-19 VITALS — BP 109/74 | HR 90 | Temp 98.0°F | Resp 18 | Wt 190.4 lb

## 2017-10-19 DIAGNOSIS — N841 Polyp of cervix uteri: Secondary | ICD-10-CM | POA: Diagnosis not present

## 2017-10-19 DIAGNOSIS — G620 Drug-induced polyneuropathy: Secondary | ICD-10-CM

## 2017-10-19 DIAGNOSIS — F5089 Other specified eating disorder: Secondary | ICD-10-CM

## 2017-10-19 DIAGNOSIS — H00014 Hordeolum externum left upper eyelid: Secondary | ICD-10-CM

## 2017-10-19 DIAGNOSIS — T451X5A Adverse effect of antineoplastic and immunosuppressive drugs, initial encounter: Secondary | ICD-10-CM

## 2017-10-19 DIAGNOSIS — E1165 Type 2 diabetes mellitus with hyperglycemia: Secondary | ICD-10-CM

## 2017-10-19 DIAGNOSIS — C50812 Malignant neoplasm of overlapping sites of left female breast: Secondary | ICD-10-CM

## 2017-10-19 DIAGNOSIS — C50512 Malignant neoplasm of lower-outer quadrant of left female breast: Secondary | ICD-10-CM

## 2017-10-19 DIAGNOSIS — Z5111 Encounter for antineoplastic chemotherapy: Secondary | ICD-10-CM

## 2017-10-19 LAB — CBC WITH DIFFERENTIAL/PLATELET
Basophils Absolute: 0 10*3/uL (ref 0–0.1)
Basophils Relative: 1 %
Eosinophils Absolute: 0.1 10*3/uL (ref 0–0.7)
Eosinophils Relative: 1 %
HCT: 35.3 % (ref 35.0–47.0)
Hemoglobin: 12.2 g/dL (ref 12.0–16.0)
Lymphocytes Relative: 15 %
Lymphs Abs: 0.8 10*3/uL — ABNORMAL LOW (ref 1.0–3.6)
MCH: 34.3 pg — ABNORMAL HIGH (ref 26.0–34.0)
MCHC: 34.4 g/dL (ref 32.0–36.0)
MCV: 99.7 fL (ref 80.0–100.0)
Monocytes Absolute: 0.2 10*3/uL (ref 0.2–0.9)
Monocytes Relative: 5 %
Neutro Abs: 3.9 10*3/uL (ref 1.4–6.5)
Neutrophils Relative %: 78 %
Platelets: 246 10*3/uL (ref 150–440)
RBC: 3.54 MIL/uL — ABNORMAL LOW (ref 3.80–5.20)
RDW: 16.8 % — ABNORMAL HIGH (ref 11.5–14.5)
WBC: 5 10*3/uL (ref 3.6–11.0)

## 2017-10-19 LAB — COMPREHENSIVE METABOLIC PANEL
ALT: 20 U/L (ref 0–44)
AST: 16 U/L (ref 15–41)
Albumin: 3.6 g/dL (ref 3.5–5.0)
Alkaline Phosphatase: 64 U/L (ref 38–126)
Anion gap: 4 — ABNORMAL LOW (ref 5–15)
BUN: 10 mg/dL (ref 6–20)
CO2: 26 mmol/L (ref 22–32)
Calcium: 8.9 mg/dL (ref 8.9–10.3)
Chloride: 108 mmol/L (ref 98–111)
Creatinine, Ser: 0.41 mg/dL — ABNORMAL LOW (ref 0.44–1.00)
GFR calc Af Amer: >60 mL/min (ref >60)
GFR calc non Af Amer: >60 mL/min (ref >60)
Glucose, Bld: 164 mg/dL — ABNORMAL HIGH (ref 70–99)
Potassium: 3.9 mmol/L (ref 3.5–5.1)
Sodium: 138 mmol/L (ref 135–145)
Total Bilirubin: 0.8 mg/dL (ref 0.3–1.2)
Total Protein: 6.4 g/dL — ABNORMAL LOW (ref 6.5–8.1)

## 2017-10-19 LAB — MAGNESIUM: Magnesium: 2.1 mg/dL (ref 1.7–2.4)

## 2017-10-19 MED ORDER — SODIUM CHLORIDE 0.9 % IV SOLN
80.0000 mg/m2 | Freq: Once | INTRAVENOUS | Status: AC
  Administered 2017-10-19: 162 mg via INTRAVENOUS
  Filled 2017-10-19: qty 27

## 2017-10-19 MED ORDER — SODIUM CHLORIDE 0.9 % IV SOLN
20.0000 mg | Freq: Once | INTRAVENOUS | Status: AC
  Administered 2017-10-19: 20 mg via INTRAVENOUS
  Filled 2017-10-19: qty 2

## 2017-10-19 MED ORDER — DIPHENHYDRAMINE HCL 50 MG/ML IJ SOLN
50.0000 mg | Freq: Once | INTRAMUSCULAR | Status: AC
  Administered 2017-10-19: 50 mg via INTRAVENOUS
  Filled 2017-10-19: qty 1

## 2017-10-19 MED ORDER — FAMOTIDINE IN NACL 20-0.9 MG/50ML-% IV SOLN
20.0000 mg | Freq: Once | INTRAVENOUS | Status: AC
  Administered 2017-10-19: 20 mg via INTRAVENOUS
  Filled 2017-10-19: qty 50

## 2017-10-19 MED ORDER — GABAPENTIN 100 MG PO CAPS: ORAL_CAPSULE | ORAL | 1 refills | Status: DC

## 2017-10-19 MED ORDER — HEPARIN SOD (PORK) LOCK FLUSH 100 UNIT/ML IV SOLN
500.0000 [IU] | Freq: Once | INTRAVENOUS | Status: AC | PRN
  Administered 2017-10-19: 500 [IU]
  Filled 2017-10-19: qty 5

## 2017-10-19 MED ORDER — SODIUM CHLORIDE 0.9 % IV SOLN
Freq: Once | INTRAVENOUS | Status: AC
  Administered 2017-10-19: 10:00:00 via INTRAVENOUS
  Filled 2017-10-19: qty 250

## 2017-10-19 NOTE — Progress Notes (Signed)
Patient c/o feeling fatigued all the time.  She sleeps well.  Appetite is off. States she eats about half of what she did but snacks throughout the day.  She has recently gone on Metformin 500 mg 2 X daily.

## 2017-10-19 NOTE — Progress Notes (Signed)
[place] [facility] day:  10/19/2017  Chief Complaint: Carolyn Roy is a 52 y.o. female with clinical stage IIIA left breast cancer who is seen for assessment prior to cycle #6 neoadjuvant Taxol.  HPI:  The patient was last seen in the medical oncology clinic on 10/05/2017.  At that time, she was doing well.  Blood sugar was 308.  She denied high readings at home.  She was to bring her glucometer in for correlation.  Gabapentin was increased to 200 mg a day.  She received cycle #4 Taxol.  She received cycle #5 on 10/12/2017.  During the interim, patient notes that she has been more fatigued this last week. Patient has continued exertional dyspnea when she "tries to do too much". Patient trying to remain active. She denies any significant  nausea, vomiting, and changes to her bowel habits. Patient denies that she has experienced any B symptoms. She denies any interval infections.   Patient notes that neuropathy in hands has resolved, however she notes that it has increased in her feet. Patient is taking gabapentin 400 mg at bedtime. She complains of bilateral eye pain and redness. She states, "I think I am getting styes in both eyes". Patient is scheduled to see her eye provider on 10/20/2017.    Patient has a decreased appetite. She is eating less than 50% of her meals. Weight today is 190 lb 7 oz (86.4 kg), which compared to her last visit to the clinic, represents a stable weight.   Patient denies pain in the clinic today.   Past Medical History:  Diagnosis Date  . Cancer (HCC)    BREAST  . Depression   . Diabetes mellitus without complication ([place]) 7116  . Endometriosis   . Family history of breast cancer   . Hyperlipidemia   . Hypertension   . Ovarian mass   . Pneumonia     Past Surgical History:  Procedure Laterality Date  . AXILLARY LYMPH NODE BIOPSY Left 07/14/2017   Procedure: INSERTION GEL MARK CLIP LEFT AXILLA;  Surgeon: Robert Bellow, MD;  Location: [facility] ORS;  Service: General;  Laterality: Left;  . OOPHORECTOMY    . PORTACATH PLACEMENT Right 07/14/2017   Procedure: INSERTION PORT-A-CATH;  Surgeon: Robert Bellow, MD;  Location: [facility] ORS;  Service: General;  Laterality: Right;  . TUBAL LIGATION      Family History  Problem Relation Age of Onset  . Other Father        No info about father or paternal relatives  . Diabetes Brother   . Pancreatitis Brother   . Prostate cancer Brother 36       currently 50 / maternal half-brother  . Breast cancer Maternal Grandmother 40       deceased 93s  . Breast cancer Maternal Aunt 65       currently 63  . Breast cancer Other 19       mother's sister; deceased 39  . Breast cancer Other        mother's sister; age at dx unknown    Social History:  reports that she has been smoking cigarettes. She has a 5.50 pack-year smoking history. She has quit using smokeless tobacco.  Her smokeless tobacco use included snuff. She reports that she does not drink alcohol or use drugs.  She smokes 0.5-1 ppd x 11 years. She works at [employer] in [place].  Patient denies known exposures to radiation on  toxins. Her fiance is Judee Clara.  The patient is accompanied by her fianc , Nathaneil Canary, today.  Allergies:  Allergies  Allergen Reactions  . Aspirin Nausea And Vomiting    Current Medications: Current Outpatient Medications  Medication Sig Dispense Refill  . albuterol (PROVENTIL HFA;VENTOLIN HFA) 108 (90 Base) MCG/ACT inhaler Inhale 2 puffs into the lungs every 6 (six) hours as needed for wheezing or shortness of breath. 1 Inhaler 0  . atorvastatin (LIPITOR) 40 MG tablet Take 1 tablet (40 mg total) by mouth daily. 90 tablet 3  . gabapentin (NEURONTIN) 100 MG capsule TAKE 1 CAPSULE (100 MG TOTAL) BY MOUTH AT BEDTIME. 90 capsule 1  . glucose blood (ONE TOUCH ULTRA TEST) test strip Use up to 4 times/day 100 each 12  . Insulin Glargine (BASAGLAR KWIKPEN) 100 UNIT/ML  SOPN INJECT 0.24 MLS (24 UNITS TOTAL) INTO THE SKIN AT BEDTIME. 15 pen 2  . Insulin Pen Needle 32G X 4 MM MISC 1 Units by Does not apply route every morning. Pen needles 90 each 3  . INVOKANA 100 MG TABS tablet TAKE 1 TABLET (100 MG TOTAL) BY MOUTH DAILY BEFORE BREAKFAST. (Patient taking differently: 300 mg. ) 30 tablet 0  . lidocaine-prilocaine (EMLA) cream Apply to affected area once 30 g 3  . Melatonin 10 MG TABS Take 20 mg by mouth at bedtime as needed (sleep).    . metFORMIN (GLUCOPHAGE) 500 MG tablet TAKE 2 TABLETS (1,000 MG TOTAL) BY MOUTH 2 (TWO) TIMES DAILY WITH A MEAL. 120 tablet 1  . ondansetron (ZOFRAN) 8 MG tablet Take 1 tablet (8 mg total) by mouth 2 (two) times daily as needed. Start on the third day after chemotherapy. 30 tablet 1  . prochlorperazine (COMPAZINE) 10 MG tablet Take 1 tablet (10 mg total) by mouth every 6 (six) hours as needed (Nausea or vomiting). (Patient not taking: Reported on 09/14/2017) 30 tablet 1  . venlafaxine XR (EFFEXOR-XR) 150 MG 24 hr capsule TAKE 1 CAPSULE (150 MG TOTAL) BY MOUTH DAILY WITH BREAKFAST. (Patient not taking: Reported on 09/21/2017) 90 capsule 1   No current facility-administered medications for this visit.     Review of Systems  Constitutional: Negative for chills, diaphoresis, fever, malaise/fatigue and weight loss (stable).       Fatigue.  HENT: Negative.  Negative for congestion, ear discharge, ear pain, hearing loss, nosebleeds, sinus pain, sore throat and tinnitus.   Eyes: Negative for blurred vision, double vision, photophobia, pain, discharge and redness.       Styes  Respiratory: Positive for cough and shortness of breath (exertional). Negative for hemoptysis and sputum production.        Smoking 1/2 pack/day.  Cardiovascular: Negative.  Negative for chest pain, palpitations, orthopnea and leg swelling.  Gastrointestinal: Negative for abdominal pain, blood in stool, constipation, diarrhea, heartburn, melena, nausea and vomiting.        Appetite 50%.  Genitourinary: Negative for dysuria, frequency, hematuria and urgency.  Musculoskeletal: Positive for joint pain (knees and hips). Negative for back pain, falls, myalgias and neck pain.  Skin: Negative.  Negative for itching and rash.  Neurological: Positive for tingling (fingertips and feet). Negative for dizziness, tremors, sensory change, speech change, focal weakness, weakness and headaches.  Endo/Heme/Allergies: Does not bruise/bleed easily.       Diabetes  Psychiatric/Behavioral: Negative for depression, hallucinations and memory loss. The patient has insomnia ("sleeps tired").   All other systems reviewed and are negative.  Performance status (ECOG): 1 - Symptomatic but completely  ambulatory  Vital signs BP 109/74 (BP Location: Left Arm, Patient Position: Sitting)   Pulse 90   Temp 98 F (36.7 C) (Tympanic)   Resp 18   Wt 190 lb 7 oz (86.4 kg)   LMP  (LMP Unknown)   BMI 31.69 kg/m   Physical Exam  Constitutional: She is oriented to person, place, and time and well-developed, well-nourished, and in no distress. No distress.  HENT:  Head: Normocephalic and atraumatic. Hair is normal.  Mouth/Throat: Oropharynx is clear and moist.  Alopecia.  Eyes: Pupils are equal, round, and reactive to light. Conjunctivae and EOM are normal. Right eye exhibits hordeolum. Left eye exhibits hordeolum. No scleral icterus.  Hazel eyes.  Left upper lid stye.  Neck: Normal range of motion. Neck supple. No JVD present.  Cardiovascular: Normal rate and normal heart sounds. Exam reveals no gallop and no friction rub.  No murmur heard. Pulmonary/Chest: Effort normal and breath sounds normal. No respiratory distress. She has no wheezes. She has no rales.  Abdominal: Soft. Bowel sounds are normal. She exhibits no distension and no mass. There is no tenderness. There is no rebound and no guarding.  Musculoskeletal: Normal range of motion. She exhibits no edema or tenderness.   Lymphadenopathy:    She has no cervical adenopathy.    She has no axillary adenopathy.       Right: No inguinal and no supraclavicular adenopathy present.       Left: No inguinal and no supraclavicular adenopathy present.  Neurological: She is alert and oriented to person, place, and time. Gait normal.  Skin: Skin is warm and dry. No rash noted. She is not diaphoretic. No erythema.  Psychiatric: Mood, affect and judgment normal.  Nursing note and vitals reviewed.   Infusion on 10/19/2017  Component Date Value Ref Range Status  . Magnesium 10/19/2017 2.1  1.7 - 2.4 mg/dL Final   Performed at [facility], [address], [place], [place] [address]  . Sodium 10/19/2017 138  135 - 145 mmol/L Final  . Potassium 10/19/2017 3.9  3.5 - 5.1 mmol/L Final  . Chloride 10/19/2017 108  98 - 111 mmol/L Final  . CO2 10/19/2017 26  22 - 32 mmol/L Final  . Glucose, Bld 10/19/2017 164* 70 - 99 mg/dL Final  . BUN 10/19/2017 10  6 - 20 mg/dL Final  . Creatinine, Ser 10/19/2017 0.41* 0.44 - 1.00 mg/dL Final  . Calcium 10/19/2017 8.9  8.9 - 10.3 mg/dL Final  . Total Protein 10/19/2017 6.4* 6.5 - 8.1 g/dL Final  . Albumin 10/19/2017 3.6  3.5 - 5.0 g/dL Final  . AST 10/19/2017 16  15 - 41 U/L Final  . ALT 10/19/2017 20  0 - 44 U/L Final  . Alkaline Phosphatase 10/19/2017 64  38 - 126 U/L Final  . Total Bilirubin 10/19/2017 0.8  0.3 - 1.2 mg/dL Final  . GFR calc non Af Amer 10/19/2017 >60  >60 mL/min Final  . GFR calc Af Amer 10/19/2017 >60  >60 mL/min Final   Comment: (NOTE) The eGFR has been calculated using the CKD EPI equation. This calculation has not been validated in all clinical situations. eGFR's persistently <60 mL/min signify possible Chronic Kidney Disease.   Georgiann Hahn gap 10/19/2017 4* 5 - 15 Final   Performed at [facility], [address] [place], [place] [address]  . WBC 10/19/2017 5.0  3.6 - 11.0 K/uL Final  . RBC 10/19/2017 3.54* 3.80 - 5.20 MIL/uL Final  .  Hemoglobin 10/19/2017 12.2  12.0 - 16.0 g/dL Final  . HCT 10/19/2017 35.3  35.0 - 47.0 % Final  . MCV 10/19/2017 99.7  80.0 - 100.0 fL Final  . MCH 10/19/2017 34.3* 26.0 - 34.0 pg Final  . MCHC 10/19/2017 34.4  32.0 - 36.0 g/dL Final  . RDW 10/19/2017 16.8* 11.5 - 14.5 % Final  . Platelets 10/19/2017 246  150 - 440 K/uL Final  . Neutrophils Relative % 10/19/2017 78  % Final  . Neutro Abs 10/19/2017 3.9  1.4 - 6.5 K/uL Final  . Lymphocytes Relative 10/19/2017 15  % Final  . Lymphs Abs 10/19/2017 0.8* 1.0 - 3.6 K/uL Final  . Monocytes Relative 10/19/2017 5  % Final  . Monocytes Absolute 10/19/2017 0.2  0.2 - 0.9 K/uL Final  . Eosinophils Relative 10/19/2017 1  % Final  . Eosinophils Absolute 10/19/2017 0.1  0 - 0.7 K/uL Final  . Basophils Relative 10/19/2017 1  % Final  . Basophils Absolute 10/19/2017 0.0  0 - 0.1 K/uL Final   Performed at [facility], [address] [place], [place] [address]    Assessment:  Carolyn Roy is a 53 y.o. female with clinical stage IIIA (T3N1Mx) left breast cancer s/p biopsy on 06/14/2017.  Pathology revealed grade III invasive ductal carcinoma.  Axillary FNA revealed malignant cells c/w metastatic carcinoma. Tumor was ER + (90%), PR + (30%), Her2/neu - and Ki67 70%.  CA27.29 was 7.8 on 06/14/2017.  Bilateral mammogram on 06/12/2017 revealed a 3.5 cm irregular mass with associated architectural distortion within the retroareolar left breast centered at the 3-4 o'clock axis.  There was probable surrounding satellite masses extending to overall measurement of 5.5 cm.  There were no masses in the right breast.  Utrasound on 06/14/2017 revealed a 2.4 x 4.0 x 5.1 cm hypoechoic mass in the retroareolar area.  The mass extended to the pectoralis fascia.  There was a prominent lymph node measuring 1.06 x 1.32 x 1.33 cm with cortical thickening.   Echo on 06/28/2017 revealed an EF of 60-65%  Chest, abdomen, and pelvis CT on 06/29/2017 revealed a LEFT breast  mass measuring approximately 3.7 x 4.4 x 4.2 cm. There were borderline enlarged LEFT axillary and left internal mammary lymph nodes.  There was an infiltrative heterogeneously enhancing mass in the cervical region extending into the lower uterine segment that was concerning for a primary cervical carcinoma.  PET scan on 07/17/2017 revealed hypermetabolic central left breast mass (SUV 8.3) compatible with malignancy.  There was subtle accentuated activity in a small left axillary lymph node, but below background blood pool activity.  The cervical prominence noted on recent CT was not appreciably hypermetabolic.  Cervical biopsy on 07/05/2017 revealed an inflamed polyp with tubal metaplasia and focal grandular atypia.  There was no evidence of malignancy.  Breast ultrasound on 09/12/2017 revealed an excellent response to neoadjuvant chemotherapy [facility]). Follow up ultrasound showed a 1.98 x 3.60 x 3.92 cm (previously 2.38 x 3.99 x 5.12 cm) mass at the 5 o'clock position, representing a 43% overall reduction in volume. LEFT axillary lymph node visible at the level of the manubrium that measured 1.43 x 1.75 x 2.27 cm.   She received 4 cycles of AC with Neulasta support (07/20/2017 - 08/31/2017). She is s/p cycle #5 neoadjuvant Taxol (09/14/2017 - 10/12/2017).   She has a family history of breast cancer.  She is premenopausal.  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   Symptomatically, patient is fatigued. She has minimal treatment related side effects. She has some nausea, but well controlled with prescribed interventions. Patient developing left stye.  No visual changes. Exertional dyspnea persists. Neuropathy in hands hands resolves, however it is worse in her feet on gabapentin 400 mg at bedtime. Exam (+) for the left stye.  WBC is 5000 ([place] 3900).   Glucose is 164.  Plan: 1. Labs today:  CBC with diff, CMP, Mg. 2. Breast cancer  Tolerating treatment well.  Minimal side effects.   Discuss repeat ultrasound imaging with surgery.  Labs reviewed. Blood counts stable and adequate enough for treatment. Will proceed with cycle #6 paclitaxel.  Discuss follow-up ultrasound with Dr. Bary Castilla 1/2 way through Taxol course. Discuss symptom management.  Patient has antiemetics and pain medications at home to use on a PRN basis. Patient  advising that the  prescribed interventions are adequate at this point. Continue all medications as previously prescribed.  3. Chemotherapy induced neuropathy  Resolved in fingers, however worse in feet.   Taking gabapentin 400 mg qhs, which is causes increased somnolence during the day.   Change gabapentin dose to 100 mg in the morning and 300 mg at bedtime. New Rx sent for #120 today. Will continue to titrate dose.  4. Hordeolum  Developing hordeolum noted to bilateral eyes (left sided stye prominent).  No exudate noted.  Denies visual changes  Patient scheduled to be seen in consult by ophthalmology later this week.  Encourage patient to use warm compresses as tolerated to help with associated discomfort. 5. Nutrition  Patient continues to have a poor appetite overall. Her weight today is 190 lb 7 oz (86.4 kg); BMI of 31.69 kg/m .  Encouraged her to increase her intake of calorie and protein dense food choices by eating small frequent meals.  6. Hyperglycemia  Glucose evelated at 164. Recent changes to antidiabetic medications. Followed by endocrinology.  7. RTC in 1 week for labs (CBC with diff, CMP, Mg), and week #7 paclitaxel. 8. RTC in 2 weeks for MD assessment, labs (CBC with diff, CMP, Mg), and week #8 paclitaxel.    Honor Loh, NP  10/19/2017, 9:03 AM   I saw and evaluated the patient, participating in the key portions of the service and reviewing pertinent diagnostic studies and records.  I  reviewed the nurse practitioner's note and agree with the findings and the plan.  The assessment and plan were discussed with the patient.  Multiple questions were asked by the patient and answered.   Nolon Stalls, MD 10/19/2017,9:03 AM

## 2017-10-20 ENCOUNTER — Telehealth: Payer: Self-pay

## 2017-10-20 NOTE — Telephone Encounter (Signed)
Voicemail left with Ms. White to return call. Gyn Onc appointment rescheduled due to MRI not being performed. Rescheduled for 9/25 at 0900 with Dr. Theora Gianotti. Oncology Nurse Navigator Documentation  Navigator Location: [facility] (10/20/17 0900)   )Navigator Encounter Type: Telephone;Follow-up Appt (10/20/17 0900)                     Patient Visit Type: GynOnc (10/20/17 0900)                              Time Spent with Patient: 15 (10/20/17 0900)

## 2017-10-25 ENCOUNTER — Ambulatory Visit: Payer: Managed Care, Other (non HMO)

## 2017-10-26 ENCOUNTER — Inpatient Hospital Stay: Payer: Managed Care, Other (non HMO)

## 2017-10-26 ENCOUNTER — Other Ambulatory Visit: Payer: Self-pay | Admitting: Hematology and Oncology

## 2017-10-26 VITALS — BP 102/70 | HR 96 | Temp 98.4°F | Resp 18 | Wt 187.4 lb

## 2017-10-26 DIAGNOSIS — N841 Polyp of cervix uteri: Secondary | ICD-10-CM | POA: Diagnosis not present

## 2017-10-26 DIAGNOSIS — C50812 Malignant neoplasm of overlapping sites of left female breast: Secondary | ICD-10-CM

## 2017-10-26 LAB — COMPREHENSIVE METABOLIC PANEL
ALT: 18 U/L (ref 0–44)
AST: 17 U/L (ref 15–41)
Albumin: 3.8 g/dL (ref 3.5–5.0)
Alkaline Phosphatase: 70 U/L (ref 38–126)
Anion gap: 7 (ref 5–15)
BUN: 10 mg/dL (ref 6–20)
CO2: 28 mmol/L (ref 22–32)
Calcium: 9.4 mg/dL (ref 8.9–10.3)
Chloride: 104 mmol/L (ref 98–111)
Creatinine, Ser: 0.48 mg/dL (ref 0.44–1.00)
GFR calc Af Amer: >60 mL/min (ref >60)
GFR calc non Af Amer: >60 mL/min (ref >60)
Glucose, Bld: 183 mg/dL — ABNORMAL HIGH (ref 70–99)
Potassium: 4.2 mmol/L (ref 3.5–5.1)
Sodium: 139 mmol/L (ref 135–145)
Total Bilirubin: 1.1 mg/dL (ref 0.3–1.2)
Total Protein: 7 g/dL (ref 6.5–8.1)

## 2017-10-26 LAB — CBC WITH DIFFERENTIAL/PLATELET
Basophils Absolute: 0.1 10*3/uL (ref 0–0.1)
Basophils Relative: 1 %
Eosinophils Absolute: 0 10*3/uL (ref 0–0.7)
Eosinophils Relative: 1 %
HCT: 36.2 % (ref 35.0–47.0)
Hemoglobin: 12.6 g/dL (ref 12.0–16.0)
Lymphocytes Relative: 12 %
Lymphs Abs: 0.8 10*3/uL — ABNORMAL LOW (ref 1.0–3.6)
MCH: 34.7 pg — ABNORMAL HIGH (ref 26.0–34.0)
MCHC: 34.8 g/dL (ref 32.0–36.0)
MCV: 99.6 fL (ref 80.0–100.0)
Monocytes Absolute: 0.4 10*3/uL (ref 0.2–0.9)
Monocytes Relative: 6 %
Neutro Abs: 5.2 10*3/uL (ref 1.4–6.5)
Neutrophils Relative %: 80 %
Platelets: 278 10*3/uL (ref 150–440)
RBC: 3.63 MIL/uL — ABNORMAL LOW (ref 3.80–5.20)
RDW: 16.3 % — ABNORMAL HIGH (ref 11.5–14.5)
WBC: 6.5 10*3/uL (ref 3.6–11.0)

## 2017-10-26 LAB — MAGNESIUM: Magnesium: 2 mg/dL (ref 1.7–2.4)

## 2017-10-26 MED ORDER — SODIUM CHLORIDE 0.9 % IV SOLN
Freq: Once | INTRAVENOUS | Status: AC
  Administered 2017-10-26: 11:00:00 via INTRAVENOUS
  Filled 2017-10-26: qty 250

## 2017-10-26 MED ORDER — DIPHENHYDRAMINE HCL 50 MG/ML IJ SOLN
50.0000 mg | Freq: Once | INTRAMUSCULAR | Status: AC
  Administered 2017-10-26: 50 mg via INTRAVENOUS
  Filled 2017-10-26: qty 1

## 2017-10-26 MED ORDER — FAMOTIDINE IN NACL 20-0.9 MG/50ML-% IV SOLN
20.0000 mg | Freq: Once | INTRAVENOUS | Status: AC
  Administered 2017-10-26: 20 mg via INTRAVENOUS
  Filled 2017-10-26: qty 50

## 2017-10-26 MED ORDER — SODIUM CHLORIDE 0.9 % IV SOLN
80.0000 mg/m2 | Freq: Once | INTRAVENOUS | Status: AC
  Administered 2017-10-26: 162 mg via INTRAVENOUS
  Filled 2017-10-26: qty 27

## 2017-10-26 MED ORDER — SODIUM CHLORIDE 0.9% FLUSH
10.0000 mL | Freq: Once | INTRAVENOUS | Status: AC
  Administered 2017-10-26: 10 mL via INTRAVENOUS
  Filled 2017-10-26: qty 10

## 2017-10-26 MED ORDER — HEPARIN SOD (PORK) LOCK FLUSH 100 UNIT/ML IV SOLN
500.0000 [IU] | Freq: Once | INTRAVENOUS | Status: AC
  Administered 2017-10-26: 500 [IU] via INTRAVENOUS
  Filled 2017-10-26: qty 5

## 2017-10-26 MED ORDER — SODIUM CHLORIDE 0.9 % IV SOLN
20.0000 mg | Freq: Once | INTRAVENOUS | Status: AC
  Administered 2017-10-26: 20 mg via INTRAVENOUS
  Filled 2017-10-26: qty 2

## 2017-10-27 ENCOUNTER — Ambulatory Visit
Admission: RE | Admit: 2017-10-27 | Discharge: 2017-10-27 | Disposition: A | Discharge status: Home / Self Care | Payer: Managed Care, Other (non HMO) | Source: Ambulatory Visit | Attending: Nurse Practitioner | Admitting: Nurse Practitioner

## 2017-10-27 DIAGNOSIS — N888 Other specified noninflammatory disorders of cervix uteri: Secondary | ICD-10-CM | POA: Diagnosis not present

## 2017-10-27 DIAGNOSIS — N841 Polyp of cervix uteri: Secondary | ICD-10-CM

## 2017-10-27 MED ORDER — GADOBENATE DIMEGLUMINE 529 MG/ML IV SOLN
17.0000 mL | Freq: Once | INTRAVENOUS | Status: AC | PRN
  Administered 2017-10-27: 17 mL via INTRAVENOUS

## 2017-10-31 NOTE — Progress Notes (Signed)
Gynecologic Oncology Interval Visit   Referring Providers: Dr. Nolon Stalls ([facility]) Dr. Hervey Ard  Chief Complaint: Follow up inflamed polyp with focal glandular atypia  Subjective:  Carolyn Roy is a 52 y.o. female who is seen in consultation from Dr. Mike Gip for cervical mass on 07/05/2017.  She presents today for follow up and her MRI results.   Of note the patient has stage IIIA left breast cancer and recently seen by Dr. Mike Gip on 10/19/2017 for assessment prior to cycle #6 neoadjuvant Taxol. Plan for her to see Dr. Fleet Contras for ultrasound and consideration of surgery followed by adjuvant chemotherapy/radiation per her report.   MRI pelvis 10/27/2017 Reproductive: The uterus measures 8.6 x 4.8 x 6.6 cm (volume = 140 cm^3). There is fluid identified within the endometrial canal. No focal enhancing endometrial mass identified. There are 4 nabothian cyst identified within the wall of the cervix. The largest measures 1.6 cm. No discrete cervical mass identified. Normal physiologic appearance of the ovaries. No adnexal mass.  Other:  No free fluid or fluid collections.  Musculoskeletal: No suspicious bone lesions identified.  IMPRESSION: 1. No endometrial or cervical mass identified. Correlation with dedicated pelvic exam is advised. 2. Multiple nabothian cysts identified within the wall of the cervix.   Gynecologic Oncology History Carolyn Roy is a pleasant female who is seen in consultation from Dr. Mike Gip for cervical mass.   Patient was initially referred to medical oncology for clinical stage IIIa left breast cancer, s/p biopsy. Pathology revealed grade III invasive ductal carcinoma area axillary FNA revealed malignant cells.  Tumor was ER positive, PR positive, HER-2/neu negative.  CA-27-29 was normal.  FSH and estradiol confirmed premenopausal status.  CT C/A/P on 06/29/2017 revealed: an infiltrative heterogeneously enhancing  mass in the cervical region extending into the lower uterine segment that was concerning for primary cervical carcinoma.   IMPRESSION: 1. Large mass in the central aspect of the left breast measuring approximately 3.7 x 4.4 x 4.2 cm. There are borderline enlarged left axillary and left internal mammary lymph nodes which are nonspecific but suspicious in the setting of known breast cancer. No other definite signs of metastatic disease elsewhere in the chest, abdomen or pelvis. 2. However, there is what appears to be an infiltrative heterogeneously enhancing mass in the cervical region extending into the lower uterine segment, concerning for concurrent primary cervical carcinoma.  "Cervical region extending into the lower uterine segment overall estimated to measure approximately 4.5 x 5.2 x 4.1"  Per Dr. Mike Gip, currently they plan for neoadjuvant chemotherapy with standard dose dense AC every 2 weeks for 4 cycles followed by weekly Taxol for 12 weeks.    She had not been getting regular Pap smears/pelvic exams and estimates last Pap smear approximately 2009.  She reports a history of abnormal pap smears and believes that she may have had '3 spots cut out of my cervix 30 years ago'.   07/05/2017 she was seen in [place] clinic and exam revealed 2 cm endocervical lesion located at the os and ballooning out the cervix, tender, mobile, firm to palpation  07/05/2017:  DIAGNOSIS:  A. CERVIX; BIOPSY:  - INFLAMED POLYP WITH TUBAL METAPLASIA AND FOCAL GLANDULAR ATYPIA, SEE NOTE.  - MULTIPLE DEEPER LEVELS WERE EXAMINED.   Note: A p16 stain is obtained and highlights a focal area of atypia as  well as inflamed epithelium. Pilar Plate malignancy is not identified in this  material.   Pap: unsatisfactory for evaluation, insufficient squamous  cellularity. HRHPV negative.   07/17/2017 IMPRESSION: 1. Hypermetabolic central left breast mass, maximum SUV 8.3, compatible with malignancy. Subtle  accentuated activity in a small left axillary lymph node, but below background blood pool activity. 2. The cervical prominence noted on recent CT is not appreciably hypermetabolic. If there is underlying cervical cancer it is not appreciably hypermetabolic. However, there is some mild prominence of the endometrial stripe, which could result from cervical stenosis. 3.  Aortic Atherosclerosis (ICD10-I70.0).  07/14/2017 she underwent 1) placement of radiologic marker and left axillary nodal metastasis; 2) right subclavian PowerPort placement with ultrasound and fluoroscopic guidance with Dr. [name].   Genetic Testing- [employer] 83 gene multi cancer panel.  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   Problem List: Patient Active Problem List   Diagnosis Date Noted  . 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 Chemotherapy-induced neutropenia (HCC) 07/29/2017  . Hyperglycemia 07/28/2017  . Goals of care, counseling/discussion 07/20/2017  . Encounter for antineoplastic chemotherapy 07/20/2017  . Cervical polyp 07/05/2017  . Family history of breast cancer   . Cancer of midline of breast, left (HCC) 06/15/2017  . Stage 1 mild COPD by GOLD classification (HCC) 03/31/2017  . Hypercholesteremia 03/03/2017  . Tobacco abuse 11/15/2016  . Depression, major, single episode, severe (HCC) 11/15/2016  . Knee pain, left 05/29/2015  . Poorly controlled type 2 diabetes mellitus (HCC) 03/30/2015  . Chronic fatigue 03/30/2015  . Severe depression (HCC) 03/30/2015   Past Medical History: Past Medical History:  Diagnosis Date  . Cancer (HCC)    BREAST  . Depression   . Diabetes mellitus without complication (HCC) 2017  . Endometriosis   . Family history of breast cancer   . Hyperlipidemia   . Hypertension   . Ovarian mass   . Pneumonia    Past  Surgical History: Past Surgical History:  Procedure Laterality Date  . AXILLARY LYMPH NODE BIOPSY Left 07/14/2017   Procedure: INSERTION GEL MARK CLIP LEFT AXILLA;  Surgeon: [name], Jeffrey W, MD;  Location: [facility] ORS;  Service: General;  Laterality: Left;  . OOPHORECTOMY    . PORTACATH PLACEMENT Right 07/14/2017   Procedure: INSERTION PORT-A-CATH;  Surgeon: [name], Jeffrey W, MD;  Location: [facility] ORS;  Service: General;  Laterality: Right;  . TUBAL LIGATION     Past Gynecologic History:  G3P3 Age of menarche: 14 Duration of periods: 1-2 days Contraception: tubal  OB History:  OB History  Gravida Para Term Preterm AB Living  3 3          SAB TAB Ectopic Multiple Live Births               # Outcome Date GA Lbr Len/2nd Weight Sex Delivery Anes PTL Lv  3 Para           2 Para           1 Para             Obstetric Comments  1st Menstrual Cycle:  14   1st Pregnancy:  21    Family History: Family History  Problem Relation Age of Onset  . Other Father        No info about father or paternal relatives  . Diabetes Brother   . Pancreatitis Brother   . Prostate cancer Brother 46       currently 47 / maternal   half-brother  . Breast cancer Maternal Grandmother 40       deceased 73s  . Breast cancer Maternal Aunt 65       currently 52  . Breast cancer Other 30       mother's sister; deceased 79  . Breast cancer Other        mother's sister; age at dx unknown    Social History: Social History   Socioeconomic History  . Marital status: Single    Spouse name: Not on file  . Number of children: Not on file  . Years of education: Not on file  . Highest education level: Not on file  Occupational History  . Not on file  Social Needs  . Financial resource strain: Not on file  . Food insecurity:    Worry: Not on file    Inability: Not on file  . Transportation needs:    Medical: Not on file    Non-medical: Not on file  Tobacco Use  . Smoking status: Current Every Day  Smoker    Packs/day: 0.50    Years: 11.00    Pack years: 5.50    Types: Cigarettes  . Smokeless tobacco: Former Systems developer    Types: Snuff  Substance and Sexual Activity  . Alcohol use: No    Alcohol/week: 0.0 standard drinks  . Drug use: No  . Sexual activity: Yes  Lifestyle  . Physical activity:    Days per week: Not on file    Minutes per session: Not on file  . Stress: Not on file  Relationships  . Social connections:    Talks on phone: Not on file    Gets together: Not on file    Attends religious service: Not on file    Active member of club or organization: Not on file    Attends meetings of clubs or organizations: Not on file    Relationship status: Not on file  . Intimate partner violence:    Fear of current or ex partner: Not on file    Emotionally abused: Not on file    Physically abused: Not on file    Forced sexual activity: Not on file  Other Topics Concern  . Not on file  Social History Narrative  . Not on file   Allergies: Allergies  Allergen Reactions  . Aspirin Nausea And Vomiting   Current Medications: Current Outpatient Medications  Medication Sig Dispense Refill  . albuterol (PROVENTIL HFA;VENTOLIN HFA) 108 (90 Base) MCG/ACT inhaler Inhale 2 puffs into the lungs every 6 (six) hours as needed for wheezing or shortness of breath. 1 Inhaler 0  . atorvastatin (LIPITOR) 40 MG tablet Take 1 tablet (40 mg total) by mouth daily. 90 tablet 3  . gabapentin (NEURONTIN) 100 MG capsule Take 1 capsule (100 mg) in the morning, and 3 capsules (300 mg) at bedtime. 120 capsule 1  . glucose blood (ONE TOUCH ULTRA TEST) test strip Use up to 4 times/day 100 each 12  . Insulin Glargine (BASAGLAR KWIKPEN) 100 UNIT/ML SOPN INJECT 0.24 MLS (24 UNITS TOTAL) INTO THE SKIN AT BEDTIME. 15 pen 2  . Insulin Pen Needle 32G X 4 MM MISC 1 Units by Does not apply route every morning. Pen needles 90 each 3  . INVOKANA 100 MG TABS tablet TAKE 1 TABLET (100 MG TOTAL) BY MOUTH DAILY BEFORE  BREAKFAST. (Patient taking differently: 300 mg. ) 30 tablet 0  . lidocaine-prilocaine (EMLA) cream Apply to affected area once 30 g 3  .  metFORMIN (GLUCOPHAGE) 500 MG tablet TAKE 2 TABLETS (1,000 MG TOTAL) BY MOUTH 2 (TWO) TIMES DAILY WITH A MEAL. 120 tablet 1  . ondansetron (ZOFRAN) 8 MG tablet Take 1 tablet (8 mg total) by mouth 2 (two) times daily as needed. Start on the third day after chemotherapy. 30 tablet 1  . prochlorperazine (COMPAZINE) 10 MG tablet Take 1 tablet (10 mg total) by mouth every 6 (six) hours as needed (Nausea or vomiting). 30 tablet 1  . nystatin (MYCOSTATIN/NYSTOP) powder Apply topically 4 (four) times daily. 30 g 0   No current facility-administered medications for this visit.    Review of Systems General:  Fatigue and weakness; poor appetite Skin: no complaints Eyes: no complaints HEENT: no complaints Breasts: no complaints Pulmonary: cough Cardiac: leg swelling Gastrointestinal: constipation Genitourinary/Sexual: problems w/ bladder function Ob/Gyn: no complaints Musculoskeletal: back pain Hematology: no complaints Neurologic/Psych: depressed mood; numbness & tingling   Objective:  Physical Examination:  BP 100/70   Pulse (!) 101   Temp 98.1 F (36.7 C) (Tympanic)   Resp 18   Ht 5' 5" (1.651 m)   Wt 190 lb 12.8 oz (86.5 kg)   LMP  (LMP Unknown)   BMI 31.75 kg/m    Vitals:   11/01/17 0916  BP: 100/70  Pulse: (!) 101  Resp: 18  Temp: 98.1 F (36.7 C)   Vitals:   11/01/17 0916  Weight: 190 lb 12.8 oz (86.5 kg)  Height: 5' 5" (1.651 m)   ECOG Performance Status: 0 - Asymptomatic   GENERAL: Patient is a well appearing female in no acute distress HEENT:  PERRL, neck supple with midline trachea. Thyroid without masses. Alopecia NODES:  No cervical, supraclavicular, axillary, or inguinal lymphadenopathy palpated.  LUNGS:  Clear to auscultation bilaterally.  No wheezes or rhonchi. HEART:  Regular rate and rhythm. No murmur  appreciated. ABDOMEN:  Soft, nontender.  Positive, normoactive bowel sounds.  MSK:  No focal spinal tenderness to palpation. Full range of motion bilaterally in the upper extremities. EXTREMITIES:  No peripheral edema.   SKIN:  Clear with no obvious rashes or skin changes. No nail dyscrasia. NEURO:  Nonfocal. Well oriented.  Appropriate affect. BREAST: deferred. Known breast cancer. Breast exams with Dr. Mike Gip  Pelvic: Exam Chaperoned by NP, EGBUS: no lesions,  Vagina: no discharge or bleeding Cervix: previous ballooning lesion now resolved. The polyp is visible in the canal is approximately 1 cm. Cervix non-tender, non-friable, mobile. At 12 o'clock smooth "hood" at anterior cervix. Uterus: mobile, not enlarged Adnexa: no palpable masses Rectovaginal: deferred.      Assessment:  AHJANAE CASSEL is a 52 y.o. female with incidental finding of cervical mass on recent CT scan which was performed for treatment planning for breast cancer. Radiologic and pathology findings consistent with inflamed cervical polyp (5.2 cm) with focal glandular atypia. Pap unsatisfactory, HRHPV negative. PET negative for appreciable metabolic activity, reassuring. Exam, polyp decreased in size substantially.   Possible thickened endometrial stripe; MRI fluid in uterus and no mass  Medical co-morbidities complicating care: Type 2 Diabetes Mellitus (poorly controlled), Smoker, stage T3N1Mx (stage II) Breast Cancer, COPD. Body mass index is 31.75 kg/m.  Plan:   Problem List Items Addressed This Visit      Genitourinary   Cervical polyp - Primary     Continue therapy for newly diagnosed breast cancer as planned.   The size of the polyp has decreased substantially in size. Plan to complete all therapy. At her next visit we will  obtain pelvic ultrasound to assess endometrial stripe, and we can probably repeat endocervical polyp biopsy at that time.   I do not think we need to repeat Pap at this time as HPV  negative and despite Pap result repeat cervical polyp biopsy needed given focal glandular atypia.   I personally had a face to face interaction and evaluated the patient jointly with the NP, Ms. Lauren Allen.  I have reviewed her history and available records and have performed the key portions of the physical exam including general and pelvic exam with my findings confirming those documented above by the APP.  I have discussed the case with the APP and the patient.  I agree with the above documentation, assessment and plan which was fully formulated by me.  Counseling was completed by me.   I personally saw the patient and performed a substantive portion of this encounter in conjunction with the listed APP as documented above.  A total of 25 minutes were spent with the patient/family today; over 50% was spent in education, counseling and coordination of care for cervical polyp and possible thickened endometrial stripe.    Alvarez , MD        CC:  Referring Providers: Dr. Melissa Corcoran ([facility] at [facility]) Dr. Jeffrey [name] 

## 2017-11-01 ENCOUNTER — Inpatient Hospital Stay (HOSPITAL_BASED_OUTPATIENT_CLINIC_OR_DEPARTMENT_OTHER): Payer: Managed Care, Other (non HMO) | Admitting: Obstetrics and Gynecology

## 2017-11-01 ENCOUNTER — Other Ambulatory Visit: Payer: Self-pay | Admitting: Nurse Practitioner

## 2017-11-01 ENCOUNTER — Encounter: Payer: Self-pay | Admitting: Obstetrics and Gynecology

## 2017-11-01 VITALS — BP 100/70 | HR 101 | Temp 98.1°F | Resp 18 | Ht 65.0 in | Wt 190.8 lb

## 2017-11-01 DIAGNOSIS — N841 Polyp of cervix uteri: Secondary | ICD-10-CM

## 2017-11-01 MED ORDER — NYSTATIN 100000 UNIT/GM EX POWD: Freq: Four times a day (QID) | CUTANEOUS | 0 refills | Status: DC

## 2017-11-01 NOTE — Progress Notes (Signed)
Pt here as f/u for abnormal tubal polyp and it showed metaplasia and she had MRI and here to get results.

## 2017-11-01 NOTE — Patient Instructions (Signed)
Nystatin topical powder  What is this medicine?  NYSTATIN (nye STAT in) is an antifungal medicine. It is used to treat certain kinds of fungal or yeast infections of the skin.  This medicine may be used for other purposes; ask your health care provider or pharmacist if you have questions.  COMMON BRAND NAME(S): Mycostatin, Nyamyc, Nyata, Nystop, Pedi-Dri  What should I tell my health care provider before I take this medicine?  They need to know if you have any of these conditions:  -an unusual or allergic reaction to nystatin, other foods, dyes or preservatives  -pregnant or trying to get pregnant  -breast-feeding  How should I use this medicine?  This medicine is for external use on the skin only. Follow the directions on the prescription label. Dust the powder on the affected area (or into socks and shoes). If you are treating diaper rash, do not use tight-fitting diapers or plastic pants. Do not get the medicine in your eyes. If you do, rinse out with plenty of cool tap water. Do not breathe in the powder. Do not use your medicine more often than directed. Use your doses at regular intervals. Finish the full course prescribed by your doctor or health care professional even if you think your condition is better. Do not stop using except on the advice of your doctor or health care professional.  Talk to your pediatrician regarding the use of this medicine in children. Special care may be needed.  Overdosage: If you think you have taken too much of this medicine contact a poison control center or emergency room at once.  NOTE: This medicine is only for you. Do not share this medicine with others.  What if I miss a dose?  If you miss a dose, use it as soon as you can. If it is almost time for your next dose, use only that dose. Do not use double or extra doses.  What may interact with this medicine?  Interactions are not expected. Do not use any other skin products on the affected area without telling your doctor or  health care professional.  This list may not describe all possible interactions. Give your health care provider a list of all the medicines, herbs, non-prescription drugs, or dietary supplements you use. Also tell them if you smoke, drink alcohol, or use illegal drugs. Some items may interact with your medicine.  What should I watch for while using this medicine?  Tell your doctor or health care professional if your symptoms do not improve after 3 days.  After bathing make sure that your skin is very dry. Fungal infections like moist conditions. Do not walk around barefoot.  To help prevent reinfection, wear freshly washed cotton, not synthetic, clothing.  What side effects may I notice from receiving this medicine?  Side effects that you should report to your doctor or health care professional as soon as possible:  -allergic reactions like skin rash, itching or hives, swelling of the face, lips, or tongue  Side effects that usually do not require medical attention (report to your doctor or health care professional if they continue or are bothersome):  -skin irritation  This list may not describe all possible side effects. Call your doctor for medical advice about side effects. You may report side effects to FDA at 1-800-FDA-1088.  Where should I keep my medicine?  Keep out of the reach of children.  Store at room temperature between 15 and 30 degrees C (59 and 86 degrees   your doctor, pharmacist, or health care provider.  2018 Elsevier/Gold Standard (2015-02-26 10:36:02)

## 2017-11-02 ENCOUNTER — Inpatient Hospital Stay (HOSPITAL_BASED_OUTPATIENT_CLINIC_OR_DEPARTMENT_OTHER): Payer: Managed Care, Other (non HMO) | Admitting: Hematology and Oncology

## 2017-11-02 ENCOUNTER — Inpatient Hospital Stay: Payer: Managed Care, Other (non HMO)

## 2017-11-02 ENCOUNTER — Encounter: Payer: Self-pay | Admitting: Hematology and Oncology

## 2017-11-02 ENCOUNTER — Encounter: Payer: Self-pay | Admitting: *Deleted

## 2017-11-02 ENCOUNTER — Telehealth: Payer: Self-pay | Admitting: Obstetrics and Gynecology

## 2017-11-02 VITALS — BP 105/73 | HR 96 | Temp 97.9°F | Resp 18 | Wt 190.1 lb

## 2017-11-02 DIAGNOSIS — N888 Other specified noninflammatory disorders of cervix uteri: Secondary | ICD-10-CM

## 2017-11-02 DIAGNOSIS — F5089 Other specified eating disorder: Secondary | ICD-10-CM

## 2017-11-02 DIAGNOSIS — Z5111 Encounter for antineoplastic chemotherapy: Secondary | ICD-10-CM

## 2017-11-02 DIAGNOSIS — C50812 Malignant neoplasm of overlapping sites of left female breast: Secondary | ICD-10-CM

## 2017-11-02 DIAGNOSIS — E1165 Type 2 diabetes mellitus with hyperglycemia: Secondary | ICD-10-CM

## 2017-11-02 DIAGNOSIS — C50512 Malignant neoplasm of lower-outer quadrant of left female breast: Secondary | ICD-10-CM

## 2017-11-02 DIAGNOSIS — N841 Polyp of cervix uteri: Secondary | ICD-10-CM | POA: Diagnosis not present

## 2017-11-02 DIAGNOSIS — G620 Drug-induced polyneuropathy: Secondary | ICD-10-CM

## 2017-11-02 DIAGNOSIS — T451X5A Adverse effect of antineoplastic and immunosuppressive drugs, initial encounter: Secondary | ICD-10-CM

## 2017-11-02 LAB — COMPREHENSIVE METABOLIC PANEL
ALT: 20 U/L (ref 0–44)
AST: 19 U/L (ref 15–41)
Albumin: 3.7 g/dL (ref 3.5–5.0)
Alkaline Phosphatase: 62 U/L (ref 38–126)
Anion gap: 6 (ref 5–15)
BUN: 13 mg/dL (ref 6–20)
CO2: 25 mmol/L (ref 22–32)
Calcium: 9.1 mg/dL (ref 8.9–10.3)
Chloride: 105 mmol/L (ref 98–111)
Creatinine, Ser: 0.51 mg/dL (ref 0.44–1.00)
GFR calc Af Amer: >60 mL/min (ref >60)
GFR calc non Af Amer: >60 mL/min (ref >60)
Glucose, Bld: 165 mg/dL — ABNORMAL HIGH (ref 70–99)
Potassium: 4.1 mmol/L (ref 3.5–5.1)
Sodium: 136 mmol/L (ref 135–145)
Total Bilirubin: 0.8 mg/dL (ref 0.3–1.2)
Total Protein: 6.4 g/dL — ABNORMAL LOW (ref 6.5–8.1)

## 2017-11-02 LAB — CBC WITH DIFFERENTIAL/PLATELET
Basophils Absolute: 0 10*3/uL (ref 0–0.1)
Basophils Relative: 1 %
Eosinophils Absolute: 0 10*3/uL (ref 0–0.7)
Eosinophils Relative: 1 %
HCT: 34.4 % — ABNORMAL LOW (ref 35.0–47.0)
Hemoglobin: 11.9 g/dL — ABNORMAL LOW (ref 12.0–16.0)
Lymphocytes Relative: 15 %
Lymphs Abs: 0.8 10*3/uL — ABNORMAL LOW (ref 1.0–3.6)
MCH: 34.5 pg — ABNORMAL HIGH (ref 26.0–34.0)
MCHC: 34.7 g/dL (ref 32.0–36.0)
MCV: 99.5 fL (ref 80.0–100.0)
Monocytes Absolute: 0.3 10*3/uL (ref 0.2–0.9)
Monocytes Relative: 5 %
Neutro Abs: 4.3 10*3/uL (ref 1.4–6.5)
Neutrophils Relative %: 78 %
Platelets: 309 10*3/uL (ref 150–440)
RBC: 3.46 MIL/uL — ABNORMAL LOW (ref 3.80–5.20)
RDW: 16 % — ABNORMAL HIGH (ref 11.5–14.5)
WBC: 5.5 10*3/uL (ref 3.6–11.0)

## 2017-11-02 LAB — MAGNESIUM: Magnesium: 1.9 mg/dL (ref 1.7–2.4)

## 2017-11-02 MED ORDER — FAMOTIDINE IN NACL 20-0.9 MG/50ML-% IV SOLN
20.0000 mg | Freq: Once | INTRAVENOUS | Status: AC
  Administered 2017-11-02: 20 mg via INTRAVENOUS
  Filled 2017-11-02: qty 50

## 2017-11-02 MED ORDER — DIPHENHYDRAMINE HCL 50 MG/ML IJ SOLN
50.0000 mg | Freq: Once | INTRAMUSCULAR | Status: AC
  Administered 2017-11-02: 50 mg via INTRAVENOUS
  Filled 2017-11-02: qty 1

## 2017-11-02 MED ORDER — SODIUM CHLORIDE 0.9 % IV SOLN
Freq: Once | INTRAVENOUS | Status: AC
  Administered 2017-11-02: 11:00:00 via INTRAVENOUS
  Filled 2017-11-02: qty 250

## 2017-11-02 MED ORDER — SODIUM CHLORIDE 0.9 % IV SOLN
20.0000 mg | Freq: Once | INTRAVENOUS | Status: AC
  Administered 2017-11-02: 20 mg via INTRAVENOUS
  Filled 2017-11-02: qty 2

## 2017-11-02 MED ORDER — HEPARIN SOD (PORK) LOCK FLUSH 100 UNIT/ML IV SOLN
500.0000 [IU] | Freq: Once | INTRAVENOUS | Status: AC | PRN
  Administered 2017-11-02: 500 [IU]
  Filled 2017-11-02: qty 5

## 2017-11-02 MED ORDER — SODIUM CHLORIDE 0.9 % IV SOLN
65.0000 mg/m2 | Freq: Once | INTRAVENOUS | Status: AC
  Administered 2017-11-02: 132 mg via INTRAVENOUS
  Filled 2017-11-02: qty 22

## 2017-11-02 NOTE — Progress Notes (Signed)
Patient offers no complaints today. 

## 2017-11-02 NOTE — Progress Notes (Signed)
[facility] day:  11/02/2017  Chief Complaint: Carolyn Roy is a 52 y.o. female with clinical stage IIIA left breast cancer who is seen for assessment prior to cycle #8 neoadjuvant Taxol.  HPI:  The patient was last seen in the medical oncology clinic on 10/19/2017.  At that time, she complained of fatigue. She had minimal treatment related side effects. She had some nausea, but was well controlled with prescribed interventions. She was developing a left stye.  No visual changes. Exertional dyspnea persisted. Neuropathy in hands hands resolved, however it was worse in her feet on gabapentin 400 mg at bedtime. WBC was 5000 ([place] 3900).  Glucose was 164.  She received cycle #6 Taxol.  Gabapentin dosing was adjusted.  She was scheduled to see Dr. Bary Castilla in follow-up, however she has not been seen in follow up consult.   She received cycle #7 on 10/26/2017.  Pelvic MRI on 10/27/2017 revealed no endometrial or cervical mass.  There were multiple nabothian cysts within the wall of the cervix.  She was seen by Dr. Theora Gianotti on 11/01/2017 for follow-up of an enhancing cervical mass seen on CT on 06/29/2017.  Cervical biopsy on 06/29/2017 revealed an inflamed polyp with tubal metaplasia and focal glandular atypia.  Exam revealed resolution of the ballooning cervix.  Polyp had decreased in size and measured 1 cm.  Symptomatically, patient is doing well overall. She remains fatigued. She is not sure about her blood sugars because of issues with her meter. Patient has significant neuropathy in her hands that is worse at night. She notes that she takes Advil, which helps. Patient states, "sometimes it is so bad that I want to cry". Today, patient has "tingling" in her thumbs, and the sensation of cramping in her feet.   She has intermittent nausea. The prescribed antiemetics have been effective in managing her pain. She is unsure of how her blood sugars have been  running due to issues with her meter at home. Patient remains on her prescribed antidiabetic medications.   Patient denies that she has experienced any B symptoms. She denies any interval infections. Patient advises that she maintains an adequate appetite. She is eating well. Weight today is 190 lb 1 oz (86.2 kg), which compared to her last visit to the clinic, represents a stable weight.   Patient denies pain in the clinic today.    Past Medical History:  Diagnosis Date  . Cancer (HCC)    BREAST  . Depression   . Diabetes mellitus without complication ([place]) 0177  . Endometriosis   . Family history of breast cancer   . Hyperlipidemia   . Hypertension   . Ovarian mass   . Pneumonia     Past Surgical History:  Procedure Laterality Date  . AXILLARY LYMPH NODE BIOPSY Left 07/14/2017   Procedure: INSERTION GEL MARK CLIP LEFT AXILLA;  Surgeon: Robert Bellow, MD;  Location: [facility] ORS;  Service: General;  Laterality: Left;  . OOPHORECTOMY    . PORTACATH PLACEMENT Right 07/14/2017   Procedure: INSERTION PORT-A-CATH;  Surgeon: Robert Bellow, MD;  Location: [facility] ORS;  Service: General;  Laterality: Right;  . TUBAL LIGATION      Family History  Problem Relation Age of Onset  . Other Father        No info about father or paternal relatives  . Diabetes Brother   . Pancreatitis Brother   . Prostate cancer Brother 69  currently 60 / maternal half-brother  . Breast cancer Maternal Grandmother 40       deceased 89s  . Breast cancer Maternal Aunt 65       currently 11  . Breast cancer Other 22       mother's sister; deceased 46  . Breast cancer Other        mother's sister; age at dx unknown    Social History:  reports that she has been smoking cigarettes. She has a 5.50 pack-year smoking history. She has quit using smokeless tobacco.  Her smokeless tobacco use included snuff. She reports that she does not drink alcohol or use drugs.  She smokes 0.5-1 ppd x 11 years. She works  at [employer] in [place].  Patient denies known exposures to radiation on toxins. Her fiance is Judee Clara.  The patient is accompanied by her fianc , Nathaneil Canary, today.  Allergies:  Allergies  Allergen Reactions  . Aspirin Nausea And Vomiting    Current Medications: Current Outpatient Medications  Medication Sig Dispense Refill  . albuterol (PROVENTIL HFA;VENTOLIN HFA) 108 (90 Base) MCG/ACT inhaler Inhale 2 puffs into the lungs every 6 (six) hours as needed for wheezing or shortness of breath. 1 Inhaler 0  . atorvastatin (LIPITOR) 40 MG tablet Take 1 tablet (40 mg total) by mouth daily. 90 tablet 3  . gabapentin (NEURONTIN) 100 MG capsule Take 1 capsule (100 mg) in the morning, and 3 capsules (300 mg) at bedtime. 120 capsule 1  . glucose blood (ONE TOUCH ULTRA TEST) test strip Use up to 4 times/day 100 each 12  . Insulin Glargine (BASAGLAR KWIKPEN) 100 UNIT/ML SOPN INJECT 0.24 MLS (24 UNITS TOTAL) INTO THE SKIN AT BEDTIME. 15 pen 2  . Insulin Pen Needle 32G X 4 MM MISC 1 Units by Does not apply route every morning. Pen needles 90 each 3  . INVOKANA 100 MG TABS tablet TAKE 1 TABLET (100 MG TOTAL) BY MOUTH DAILY BEFORE BREAKFAST. (Patient taking differently: 300 mg. ) 30 tablet 0  . lidocaine-prilocaine (EMLA) cream Apply to affected area once 30 g 3  . metFORMIN (GLUCOPHAGE) 500 MG tablet TAKE 2 TABLETS (1,000 MG TOTAL) BY MOUTH 2 (TWO) TIMES DAILY WITH A MEAL. 120 tablet 1  . nystatin (MYCOSTATIN/NYSTOP) powder Apply topically 4 (four) times daily. 30 g 0  . ondansetron (ZOFRAN) 8 MG tablet Take 1 tablet (8 mg total) by mouth 2 (two) times daily as needed. Start on the third day after chemotherapy. 30 tablet 1  . prochlorperazine (COMPAZINE) 10 MG tablet Take 1 tablet (10 mg total) by mouth every 6 (six) hours as needed (Nausea or vomiting). 30 tablet 1   No current facility-administered medications for this visit.     Review of Systems  Constitutional: Positive for  malaise/fatigue. Negative for diaphoresis, fever and weight loss (stable).       Feels "fine".  HENT: Negative.   Eyes: Negative.   Respiratory: Positive for cough and shortness of breath (exertional). Negative for hemoptysis and sputum production.        Smoking 0.5 ppd  Cardiovascular: Negative for chest pain, palpitations, orthopnea, leg swelling and PND.  Gastrointestinal: Negative for abdominal pain, blood in stool, constipation, diarrhea, melena, nausea and vomiting.  Genitourinary: Negative for dysuria, frequency, hematuria and urgency.  Musculoskeletal: Positive for joint pain (knees and hips). Negative for back pain, falls and myalgias.  Skin: Negative for itching and rash.  Neurological: Positive for sensory change (worsening neuropathy in  hands and feet). Negative for dizziness, tremors, weakness and headaches.  Endo/Heme/Allergies: Does not bruise/bleed easily.       Diabetes  Psychiatric/Behavioral: Negative for depression, memory loss and suicidal ideas. The patient has insomnia. The patient is not nervous/anxious.   All other systems reviewed and are negative.  Performance status (ECOG): 1 - Symptomatic but completely ambulatory  Vital signs BP 105/73 (BP Location: Left Arm, Patient Position: Sitting)   Pulse 96   Temp 97.9 F (36.6 C) (Tympanic)   Resp 18   Wt 190 lb 1 oz (86.2 kg)   LMP  (LMP Unknown)   BMI 31.63 kg/m   Physical Exam  Constitutional: She is oriented to person, place, and time and well-developed, well-nourished, and in no distress.  HENT:  Head: Normocephalic and atraumatic.  Alopecia.  Eyes: Pupils are equal, round, and reactive to light. EOM are normal. No scleral icterus.  Hazel eyes  Neck: Normal range of motion. Neck supple. No tracheal deviation present. No thyromegaly present.  Cardiovascular: Normal rate, regular rhythm and normal heart sounds. Exam reveals no gallop and no friction rub.  No murmur heard. Pulmonary/Chest: Effort normal  and breath sounds normal. No respiratory distress. She has no wheezes. She has no rales.  Abdominal: Soft. Bowel sounds are normal. She exhibits no distension. There is no tenderness.  Musculoskeletal: Normal range of motion. She exhibits no edema or tenderness.  Lymphadenopathy:    She has no cervical adenopathy.    She has no axillary adenopathy.       Right: No inguinal and no supraclavicular adenopathy present.       Left: No inguinal and no supraclavicular adenopathy present.  Neurological: She is alert and oriented to person, place, and time.  Skin: Skin is warm and dry. No rash noted. No erythema.  Psychiatric: Mood, affect and judgment normal.  Nursing note and vitals reviewed.   Infusion on 11/02/2017  Component Date Value Ref Range Status  . Magnesium 11/02/2017 1.9  1.7 - 2.4 mg/dL Final   Performed at [facility], [address], [place], [place] [address]  . Sodium 11/02/2017 136  135 - 145 mmol/L Final  . Potassium 11/02/2017 4.1  3.5 - 5.1 mmol/L Final  . Chloride 11/02/2017 105  98 - 111 mmol/L Final  . CO2 11/02/2017 25  22 - 32 mmol/L Final  . Glucose, Bld 11/02/2017 165* 70 - 99 mg/dL Final  . BUN 11/02/2017 13  6 - 20 mg/dL Final  . Creatinine, Ser 11/02/2017 0.51  0.44 - 1.00 mg/dL Final  . Calcium 11/02/2017 9.1  8.9 - 10.3 mg/dL Final  . Total Protein 11/02/2017 6.4* 6.5 - 8.1 g/dL Final  . Albumin 11/02/2017 3.7  3.5 - 5.0 g/dL Final  . AST 11/02/2017 19  15 - 41 U/L Final  . ALT 11/02/2017 20  0 - 44 U/L Final  . Alkaline Phosphatase 11/02/2017 62  38 - 126 U/L Final  . Total Bilirubin 11/02/2017 0.8  0.3 - 1.2 mg/dL Final  . GFR calc non Af Amer 11/02/2017 >60  >60 mL/min Final  . GFR calc Af Amer 11/02/2017 >60  >60 mL/min Final   Comment: (NOTE) The eGFR has been calculated using the CKD EPI equation. This calculation has not been validated in all clinical situations. eGFR's persistently <60 mL/min signify possible Chronic Kidney Disease.    . Anion gap 11/02/2017 6  5 - 15 Final   Performed at [facility], [place],  [place], [place] [address]  . WBC 11/02/2017 5.5  3.6 - 11.0 K/uL Final  . RBC 11/02/2017 3.46* 3.80 - 5.20 MIL/uL Final  . Hemoglobin 11/02/2017 11.9* 12.0 - 16.0 g/dL Final  . HCT 11/02/2017 34.4* 35.0 - 47.0 % Final  . MCV 11/02/2017 99.5  80.0 - 100.0 fL Final  . MCH 11/02/2017 34.5* 26.0 - 34.0 pg Final  . MCHC 11/02/2017 34.7  32.0 - 36.0 g/dL Final  . RDW 11/02/2017 16.0* 11.5 - 14.5 % Final  . Platelets 11/02/2017 309  150 - 440 K/uL Final  . Neutrophils Relative % 11/02/2017 78  % Final  . Neutro Abs 11/02/2017 4.3  1.4 - 6.5 K/uL Final  . Lymphocytes Relative 11/02/2017 15  % Final  . Lymphs Abs 11/02/2017 0.8* 1.0 - 3.6 K/uL Final  . Monocytes Relative 11/02/2017 5  % Final  . Monocytes Absolute 11/02/2017 0.3  0.2 - 0.9 K/uL Final  . Eosinophils Relative 11/02/2017 1  % Final  . Eosinophils Absolute 11/02/2017 0.0  0 - 0.7 K/uL Final  . Basophils Relative 11/02/2017 1  % Final  . Basophils Absolute 11/02/2017 0.0  0 - 0.1 K/uL Final   Performed at [facility], [address] [place], [place] [address]    Assessment:  KLYN KROENING is a 52 y.o. female with clinical stage IIIA (T3N1Mx) left breast cancer s/p biopsy on 06/14/2017.  Pathology revealed grade III invasive ductal carcinoma.  Axillary FNA revealed malignant cells c/w metastatic carcinoma. Tumor was ER + (90%), PR + (30%), Her2/neu - and Ki67 70%.  CA27.29 was 7.8 on 06/14/2017.  Bilateral mammogram on 06/12/2017 revealed a 3.5 cm irregular mass with associated architectural distortion within the retroareolar left breast centered at the 3-4 o'clock axis.  There was probable surrounding satellite masses extending to overall measurement of 5.5 cm.  There were no masses in the right breast.  Utrasound on 06/14/2017 revealed a 2.4 x 4.0 x 5.1 cm hypoechoic mass in the retroareolar area.  The mass extended to the  pectoralis fascia.  There was a prominent lymph node measuring 1.06 x 1.32 x 1.33 cm with cortical thickening.   Echo on 06/28/2017 revealed an EF of 60-65%  Chest, abdomen, and pelvis CT on 06/29/2017 revealed a LEFT breast mass measuring approximately 3.7 x 4.4 x 4.2 cm. There were borderline enlarged LEFT axillary and left internal mammary lymph nodes.  There was an infiltrative heterogeneously enhancing mass in the cervical region extending into the lower uterine segment that was concerning for a primary cervical carcinoma.  PET scan on 07/17/2017 revealed hypermetabolic central left breast mass (SUV 8.3) compatible with malignancy.  There was subtle accentuated activity in a small left axillary lymph node, but below background blood pool activity.  The cervical prominence noted on recent CT was not appreciably hypermetabolic.  Pelvic MRI on 10/27/2017 revealed no endometrial or cervical mass.  There were multiple nabothian cysts within the wall of the cervix.  Cervical biopsy on 07/05/2017 revealed an inflamed polyp with tubal metaplasia and focal grandular atypia.  There was no evidence of malignancy.  Breast ultrasound on 09/12/2017 revealed an excellent response to neoadjuvant chemotherapy [facility]). Follow up ultrasound showed a 1.98 x 3.60 x 3.92 cm (previously 2.38 x 3.99 x 5.12 cm) mass at the 5 o'clock position, representing a 43% overall reduction in volume. LEFT axillary lymph node visible at the level of the manubrium that measured 1.43 x 1.75 x 2.27 cm.   She received 4  cycles of AC with Neulasta support (07/20/2017 - 08/31/2017). She is s/p cycle #5 neoadjuvant Taxol (09/14/2017 - 10/12/2017).   She has a family history of breast cancer.  She is premenopausal.  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   Symptomatically, she  remains fatigued.  Pain and nausea well controlled.  She has a grade II neuropathy.  She is on gabapentin.  She has shortness of breath with exertion, improved.  Exam grossly unremarkable.  WBC 5500 ([place] 4300).  Blood sugar 165.  Plan: 1. Labs today:  CBC with diff, CMP, Mg. 2. Breast cancer  Patient doing well overall.  Tolerating treatments with the exception of progressive neuropathy.   Discuss paclitaxel dose reduction due to progressive neuropathy.  Patient in agreement.   Discussed follow-up ultrasound with Dr. Bary Castilla. Will need to schedule prior to next treatment.   Labs reviewed. Blood counts stable and adequate enough for treatment. Will proceed with cycle #8 paclitaxel.  Discuss symptom management.  Patient has antiemetics and pain medications at home to use on a PRN basis. Patient  advising that the  prescribed interventions are adequate at this point. Continue all medications as previously prescribed.  3. Chemotherapy-induced neuropathy  Neuropathy in her feet is worsening despite prescribed gabapentin. Neuropathy is now grade II.  Discuss dose reduction of paclitaxel treatments.    If neuropathy is stable next week, anticipate continuation of therapy at the reduced dose.    If neuropathy has not improved, or has worsened, anticipate holding therapy x1 week to assess for improvement.  Patient currently taking gabapentin differently than discussed in the office.  She is taking 100 mg in the morning, 100 mg midday, and 200 mg at bedtime with Advil.  Discussed up titration of dose, however patient resistant due to associated somnolence.  Will trial increasing morning dose to 200 mg, followed by the 100 mg midday dose, and 200 mg bedtime dose (total daily dose of 500 mg). 4. Cervical mass  Review interval MRI and exam by Dr. Theora Gianotti.  Continue to monitor. 5. Nutrition  Appetite labile.  Her weight today is 190 lb 1 oz (86.2 kg); BMI of 31.63 kg/m   Encouraged her to  increase her intake of calorie and protein dense food choices eating small frequent meals. 6. Hyperglycemia  Blood sugar 165 in clinic today.  Patient not routinely monitoring glucose levels at home due to issues with her meter.  Patient continues to be followed by endocrinology.  She notes that she is compliant with her prescribed interventions. 7. RTC in 1 week for MD assessment,  labs (CBC with diff, CMP, Mg), and week #9 paclitaxel.  May hold if neuropathy not improved.  8. RTC in 2 weeks for MD assessment, labs (CBC with diff, CMP, Mg), and week #10 paclitaxel.    Honor Loh, NP  11/02/2017, 10:33 AM   I saw and evaluated the patient, participating in the key portions of the service and reviewing pertinent diagnostic studies and records.  I reviewed the nurse practitioner's note and agree with the findings and the plan.  The assessment and plan were discussed with the patient.  Multiple questions were asked by the patient and answered.   Nolon Stalls, MD 11/02/2017,10:33 AM

## 2017-11-02 NOTE — Telephone Encounter (Signed)
Called patient regarding [place] appt schd,per Courtney/schd msg/GYN. L/M on v/m. Updated appt schd also mailed.

## 2017-11-07 ENCOUNTER — Encounter: Payer: Self-pay | Admitting: General Surgery

## 2017-11-07 ENCOUNTER — Ambulatory Visit: Payer: Self-pay

## 2017-11-07 ENCOUNTER — Ambulatory Visit: Payer: Managed Care, Other (non HMO) | Admitting: General Surgery

## 2017-11-07 VITALS — BP 130/80 | HR 78 | Ht 66.0 in | Wt 189.0 lb

## 2017-11-07 DIAGNOSIS — Z170 Estrogen receptor positive status [ER+]: Secondary | ICD-10-CM | POA: Diagnosis not present

## 2017-11-07 DIAGNOSIS — C50512 Malignant neoplasm of lower-outer quadrant of left female breast: Secondary | ICD-10-CM

## 2017-11-07 NOTE — Progress Notes (Signed)
Patient ID: Carolyn Roy, female   DOB: 06-17-1965, 52 y.o.   MRN: [id number]  Chief Complaint  Patient presents with  . Follow-up    HPI Carolyn Roy is a 52 y.o. female here today for a left breast ultrasound to assess treatment response.  She is undergoing neoadjuvant chemotherapy for a T2, and 1 carcinoma of the left breast.   HPI  Past Medical History:  Diagnosis Date  . Cancer (HCC)    BREAST  . Depression   . Diabetes mellitus without complication ([place]) 5284  . Endometriosis   . Family history of breast cancer   . Hyperlipidemia   . Hypertension   . Ovarian mass   . Pneumonia     Past Surgical History:  Procedure Laterality Date  . AXILLARY LYMPH NODE BIOPSY Left 07/14/2017   Procedure: INSERTION GEL MARK CLIP LEFT AXILLA;  Surgeon: Robert Bellow, MD;  Location: [facility] ORS;  Service: General;  Laterality: Left;  . OOPHORECTOMY    . PORTACATH PLACEMENT Right 07/14/2017   Procedure: INSERTION PORT-A-CATH;  Surgeon: Robert Bellow, MD;  Location: [facility] ORS;  Service: General;  Laterality: Right;  . TUBAL LIGATION      Family History  Problem Relation Age of Onset  . Other Father        No info about father or paternal relatives  . Diabetes Brother   . Pancreatitis Brother   . Prostate cancer Brother 41       currently 84 / maternal half-brother  . Breast cancer Maternal Grandmother 40       deceased 77s  . Breast cancer Maternal Aunt 65       currently 71  . Breast cancer Other 49       mother's sister; deceased 42  . Breast cancer Other        mother's sister; age at dx unknown    Social History Social History   Tobacco Use  . Smoking status: Current Every Day Smoker    Packs/day: 0.50    Years: 11.00    Pack years: 5.50    Types: Cigarettes  . Smokeless tobacco: Former Systems developer    Types: Snuff  Substance Use Topics  . Alcohol use: No    Alcohol/week: 0.0 standard drinks  . Drug use: No    Allergies  Allergen Reactions  . Aspirin  Nausea And Vomiting    Current Outpatient Medications  Medication Sig Dispense Refill  . albuterol (PROVENTIL HFA;VENTOLIN HFA) 108 (90 Base) MCG/ACT inhaler Inhale 2 puffs into the lungs every 6 (six) hours as needed for wheezing or shortness of breath. 1 Inhaler 0  . atorvastatin (LIPITOR) 40 MG tablet Take 1 tablet (40 mg total) by mouth daily. 90 tablet 3  . gabapentin (NEURONTIN) 100 MG capsule Take 1 capsule (100 mg) in the morning, and 3 capsules (300 mg) at bedtime. 120 capsule 1  . glucose blood (ONE TOUCH ULTRA TEST) test strip Use up to 4 times/day 100 each 12  . Insulin Glargine (BASAGLAR KWIKPEN) 100 UNIT/ML SOPN INJECT 0.24 MLS (24 UNITS TOTAL) INTO THE SKIN AT BEDTIME. 15 pen 2  . Insulin Pen Needle 32G X 4 MM MISC 1 Units by Does not apply route every morning. Pen needles 90 each 3  . INVOKANA 100 MG TABS tablet TAKE 1 TABLET (100 MG TOTAL) BY MOUTH DAILY BEFORE BREAKFAST. (Patient taking differently: 300 mg. ) 30 tablet 0  . lidocaine-prilocaine (EMLA) cream Apply to affected area once  30 g 3  . metFORMIN (GLUCOPHAGE) 500 MG tablet TAKE 2 TABLETS (1,000 MG TOTAL) BY MOUTH 2 (TWO) TIMES DAILY WITH A MEAL. 120 tablet 1  . nystatin (MYCOSTATIN/NYSTOP) powder Apply topically 4 (four) times daily. 30 g 0  . ondansetron (ZOFRAN) 8 MG tablet Take 1 tablet (8 mg total) by mouth 2 (two) times daily as needed. Start on the third day after chemotherapy. 30 tablet 1  . prochlorperazine (COMPAZINE) 10 MG tablet Take 1 tablet (10 mg total) by mouth every 6 (six) hours as needed (Nausea or vomiting). 30 tablet 1   No current facility-administered medications for this visit.     Review of Systems Review of Systems  Constitutional: Negative.   Respiratory: Negative.     Blood pressure 130/80, pulse 78, height 5\' 6"  (1.676 m), weight 189 lb (85.7 kg).  Physical Exam Physical Exam  Pulmonary/Chest:    Lymphadenopathy:    She has no axillary adenopathy.       Right: No  supraclavicular adenopathy present.       Left: No supraclavicular adenopathy present.    Data Reviewed Ultrasound examination of the left breast was undertaken to determine response to undergoing neoadjuvant chemotherapy.  The mass in the 5 o'clock position, 2 cm from the nipple has undergone significant improvement now decreased to 1.5 x 1.6 x 1.7 cm.  Previously this measured 2.0 x 3.6 x 3.9 cm at the time of her September 12, 2017 exam.  This represents an approximately 80% reduction.  The axillary lymph node online with the manubrium in the lower aspect of the axilla is still moderately enlarged at 1.3 x 1.67 x 1.73.  Previously this lymph node measured up to 2.2 cm in diameter.  BI-RADS-6  Assessment    Ongoing improvement with neoadjuvant chemotherapy.    Plan    We will plan to see the patient shortly after she completes her neoadjuvant treatment and arrange scheduling for surgery sometime mid November through mid December.  The patient may well be a candidate for breast conservation.  We briefly reviewed goals for surgery to remove all known tumor, obtain clear margins and to provide adequate breast volume for visual symmetry.  Possibility of need for reexcision if positive margins are noted was discussed. Potential reconstruction should mastectomy be required reviewed.   Role of postoperative radiation reviewed.     The patient will be asked to return after she is completed her neoadjuvant chemotherapy.       [name] Kateryna Grantham 11/08/2017, 9:17 PM

## 2017-11-08 DIAGNOSIS — C50512 Malignant neoplasm of lower-outer quadrant of left female breast: Secondary | ICD-10-CM | POA: Insufficient documentation

## 2017-11-08 DIAGNOSIS — Z170 Estrogen receptor positive status [ER+]: Principal | ICD-10-CM

## 2017-11-09 ENCOUNTER — Inpatient Hospital Stay (HOSPITAL_BASED_OUTPATIENT_CLINIC_OR_DEPARTMENT_OTHER): Payer: Managed Care, Other (non HMO) | Admitting: Hematology and Oncology

## 2017-11-09 ENCOUNTER — Encounter: Payer: Self-pay | Admitting: Hematology and Oncology

## 2017-11-09 ENCOUNTER — Inpatient Hospital Stay: Payer: Managed Care, Other (non HMO) | Attending: Hematology and Oncology

## 2017-11-09 ENCOUNTER — Inpatient Hospital Stay: Payer: Managed Care, Other (non HMO)

## 2017-11-09 VITALS — BP 111/78 | HR 99 | Temp 96.6°F | Resp 16 | Wt 189.4 lb

## 2017-11-09 VITALS — BP 104/70 | HR 91

## 2017-11-09 DIAGNOSIS — Z8249 Family history of ischemic heart disease and other diseases of the circulatory system: Secondary | ICD-10-CM

## 2017-11-09 DIAGNOSIS — F17210 Nicotine dependence, cigarettes, uncomplicated: Secondary | ICD-10-CM

## 2017-11-09 DIAGNOSIS — R530 Neoplastic (malignant) related fatigue: Secondary | ICD-10-CM | POA: Insufficient documentation

## 2017-11-09 DIAGNOSIS — Z803 Family history of malignant neoplasm of breast: Secondary | ICD-10-CM | POA: Insufficient documentation

## 2017-11-09 DIAGNOSIS — C50512 Malignant neoplasm of lower-outer quadrant of left female breast: Secondary | ICD-10-CM | POA: Diagnosis not present

## 2017-11-09 DIAGNOSIS — Z7984 Long term (current) use of oral hypoglycemic drugs: Secondary | ICD-10-CM | POA: Insufficient documentation

## 2017-11-09 DIAGNOSIS — Z5111 Encounter for antineoplastic chemotherapy: Secondary | ICD-10-CM | POA: Insufficient documentation

## 2017-11-09 DIAGNOSIS — Z794 Long term (current) use of insulin: Secondary | ICD-10-CM

## 2017-11-09 DIAGNOSIS — Z79899 Other long term (current) drug therapy: Secondary | ICD-10-CM | POA: Insufficient documentation

## 2017-11-09 DIAGNOSIS — R0602 Shortness of breath: Secondary | ICD-10-CM | POA: Insufficient documentation

## 2017-11-09 DIAGNOSIS — Z170 Estrogen receptor positive status [ER+]: Secondary | ICD-10-CM | POA: Diagnosis not present

## 2017-11-09 DIAGNOSIS — T451X5A Adverse effect of antineoplastic and immunosuppressive drugs, initial encounter: Secondary | ICD-10-CM

## 2017-11-09 DIAGNOSIS — G620 Drug-induced polyneuropathy: Secondary | ICD-10-CM

## 2017-11-09 DIAGNOSIS — E1165 Type 2 diabetes mellitus with hyperglycemia: Secondary | ICD-10-CM | POA: Insufficient documentation

## 2017-11-09 DIAGNOSIS — C50812 Malignant neoplasm of overlapping sites of left female breast: Secondary | ICD-10-CM

## 2017-11-09 LAB — CBC WITH DIFFERENTIAL/PLATELET
Basophils Absolute: 0.1 10*3/uL (ref 0–0.1)
Basophils Relative: 1 %
Eosinophils Absolute: 0 10*3/uL (ref 0–0.7)
Eosinophils Relative: 0 %
HCT: 36.6 % (ref 35.0–47.0)
Hemoglobin: 12.4 g/dL (ref 12.0–16.0)
Lymphocytes Relative: 10 %
Lymphs Abs: 0.9 10*3/uL — ABNORMAL LOW (ref 1.0–3.6)
MCH: 34.2 pg — ABNORMAL HIGH (ref 26.0–34.0)
MCHC: 33.9 g/dL (ref 32.0–36.0)
MCV: 101 fL — ABNORMAL HIGH (ref 80.0–100.0)
Monocytes Absolute: 0.4 10*3/uL (ref 0.2–0.9)
Monocytes Relative: 5 %
Neutro Abs: 7.3 10*3/uL — ABNORMAL HIGH (ref 1.4–6.5)
Neutrophils Relative %: 84 %
Platelets: 285 10*3/uL (ref 150–440)
RBC: 3.63 MIL/uL — ABNORMAL LOW (ref 3.80–5.20)
RDW: 16.1 % — ABNORMAL HIGH (ref 11.5–14.5)
WBC: 8.7 10*3/uL (ref 3.6–11.0)

## 2017-11-09 LAB — COMPREHENSIVE METABOLIC PANEL
ALT: 25 U/L (ref 0–44)
AST: 24 U/L (ref 15–41)
Albumin: 3.7 g/dL (ref 3.5–5.0)
Alkaline Phosphatase: 66 U/L (ref 38–126)
Anion gap: 8 (ref 5–15)
BUN: 12 mg/dL (ref 6–20)
CO2: 26 mmol/L (ref 22–32)
Calcium: 9 mg/dL (ref 8.9–10.3)
Chloride: 105 mmol/L (ref 98–111)
Creatinine, Ser: 0.42 mg/dL — ABNORMAL LOW (ref 0.44–1.00)
GFR calc Af Amer: >60 mL/min (ref >60)
GFR calc non Af Amer: >60 mL/min (ref >60)
Glucose, Bld: 225 mg/dL — ABNORMAL HIGH (ref 70–99)
Potassium: 3.7 mmol/L (ref 3.5–5.1)
Sodium: 139 mmol/L (ref 135–145)
Total Bilirubin: 0.7 mg/dL (ref 0.3–1.2)
Total Protein: 6.5 g/dL (ref 6.5–8.1)

## 2017-11-09 LAB — MAGNESIUM: Magnesium: 1.8 mg/dL (ref 1.7–2.4)

## 2017-11-09 MED ORDER — SODIUM CHLORIDE 0.9 % IV SOLN
65.0000 mg/m2 | Freq: Once | INTRAVENOUS | Status: AC
  Administered 2017-11-09: 132 mg via INTRAVENOUS
  Filled 2017-11-09: qty 22

## 2017-11-09 MED ORDER — SODIUM CHLORIDE 0.9 % IV SOLN
20.0000 mg | Freq: Once | INTRAVENOUS | Status: AC
  Administered 2017-11-09: 20 mg via INTRAVENOUS
  Filled 2017-11-09: qty 2

## 2017-11-09 MED ORDER — DIPHENHYDRAMINE HCL 50 MG/ML IJ SOLN
50.0000 mg | Freq: Once | INTRAMUSCULAR | Status: AC
  Administered 2017-11-09: 50 mg via INTRAVENOUS
  Filled 2017-11-09: qty 1

## 2017-11-09 MED ORDER — HEPARIN SOD (PORK) LOCK FLUSH 100 UNIT/ML IV SOLN: 500.0000 [IU] | Freq: Once | INTRAVENOUS | Status: DC | PRN

## 2017-11-09 MED ORDER — FAMOTIDINE IN NACL 20-0.9 MG/50ML-% IV SOLN
20.0000 mg | Freq: Once | INTRAVENOUS | Status: AC
  Administered 2017-11-09: 20 mg via INTRAVENOUS
  Filled 2017-11-09: qty 50

## 2017-11-09 MED ORDER — HEPARIN SOD (PORK) LOCK FLUSH 100 UNIT/ML IV SOLN
500.0000 [IU] | Freq: Once | INTRAVENOUS | Status: AC
  Administered 2017-11-09: 500 [IU] via INTRAVENOUS
  Filled 2017-11-09: qty 5

## 2017-11-09 MED ORDER — SODIUM CHLORIDE 0.9% FLUSH
10.0000 mL | Freq: Once | INTRAVENOUS | Status: AC
  Administered 2017-11-09: 10 mL via INTRAVENOUS
  Filled 2017-11-09: qty 10

## 2017-11-09 MED ORDER — SODIUM CHLORIDE 0.9 % IV SOLN
Freq: Once | INTRAVENOUS | Status: AC
  Administered 2017-11-09: 12:00:00 via INTRAVENOUS
  Filled 2017-11-09: qty 250

## 2017-11-09 NOTE — Progress Notes (Signed)
[facility] day:  11/09/2017  Chief Complaint: Carolyn Roy is a 52 y.o. female with clinical stage IIIA left breast cancer who is seen for assessment prior to cycle #9 neoadjuvant Taxol.  HPI:  The patient was last seen in the medical oncology clinic on 11/02/2017.  At that time, she remained fatigued.  Pain and nausea were well controlled.  She had a grade II neuropathy.  She was on gabapentin.  She had shortness of breath with exertion, improved.  Exam was grossly unremarkable.  WBC was 5500 ([place] 4300).  Blood sugar was 165.  She received cycle #8 Taxol with a 20% dose reduction.  She was seen in follow up consult on 11/07/2017 by Dr. Hervey Ard (surgery). Notes reviewed.  LEFT breast mass with 80% reduction in size.  Mass now measures 1.5 x 1.6 x 1.7 cm (previously 2.0 x 3.6 x 3.9 cm). LEFT axillary lymph node continued to be moderately enlarged at 1.3 x 1.67 x 1.73 cm (previously measured up to 2.2 cm in diameter).  Discussed plans to see patient following completion of her neoadjuvant chemotherapy treatments.  Discussed potential surgery between mid November in mid December.  During the interim, patient is fatigued. She states, "I try to stay as active as I can. I do what I can". Breathing is noted to be "fine" when she uses her prescribed MDI twice a day. Patient states, "my body just feels wore out".  Patient continues to smoke 0.5 packs of cigarettes a day.   She notes some improvement in the degree of neuropathy in her fingers and toes. She notes slight difficulties grasping items when she picks them up. No major fine motor disturbances. She denies nausea, vomiting, and changes to her bowel habits. Patient is sleeping better. She is "sleeping hard".   Patient unable to check her blood sugar levels at home. She notes continued issues with her glucometer. Patient down to a half of a soda a day. She continues on her medications as  prescribed by endocrinology.   Patient advises that she maintains an adequate appetite. She is eating well. Weight today is 189 lb 6 oz (85.9 kg), which compared to her last visit to the clinic, represents a 1 pound decrease.    Patient denies pain in the clinic today.   Past Medical History:  Diagnosis Date  . Cancer (HCC)    BREAST  . Depression   . Diabetes mellitus without complication ([place]) 8546  . Endometriosis   . Family history of breast cancer   . Hyperlipidemia   . Hypertension   . Ovarian mass   . Pneumonia     Past Surgical History:  Procedure Laterality Date  . AXILLARY LYMPH NODE BIOPSY Left 07/14/2017   Procedure: INSERTION GEL MARK CLIP LEFT AXILLA;  Surgeon: Robert Bellow, MD;  Location: [facility] ORS;  Service: General;  Laterality: Left;  . OOPHORECTOMY    . PORTACATH PLACEMENT Right 07/14/2017   Procedure: INSERTION PORT-A-CATH;  Surgeon: Robert Bellow, MD;  Location: [facility] ORS;  Service: General;  Laterality: Right;  . TUBAL LIGATION      Family History  Problem Relation Age of Onset  . Other Father        No info about father or paternal relatives  . Diabetes Brother   . Pancreatitis Brother   . Prostate cancer Brother 77       currently 72 / maternal half-brother  . Breast cancer Maternal  Grandmother 40       deceased 68s  . Breast cancer Maternal Aunt 65       currently 46  . Breast cancer Other 26       mother's sister; deceased 68  . Breast cancer Other        mother's sister; age at dx unknown    Social History:  reports that she has been smoking cigarettes. She has a 5.50 pack-year smoking history. She has quit using smokeless tobacco.  Her smokeless tobacco use included snuff. She reports that she does not drink alcohol or use drugs.  She smokes 0.5-1 ppd x 11 years. She works at [employer] in [place].  Patient denies known exposures to radiation on toxins. Her fiance is Judee Clara.  The patient is accompanied by her  fianc , Nathaneil Canary, today.  Allergies:  Allergies  Allergen Reactions  . Aspirin Nausea And Vomiting    Current Medications: Current Outpatient Medications  Medication Sig Dispense Refill  . albuterol (PROVENTIL HFA;VENTOLIN HFA) 108 (90 Base) MCG/ACT inhaler Inhale 2 puffs into the lungs every 6 (six) hours as needed for wheezing or shortness of breath. 1 Inhaler 0  . atorvastatin (LIPITOR) 40 MG tablet Take 1 tablet (40 mg total) by mouth daily. 90 tablet 3  . gabapentin (NEURONTIN) 100 MG capsule Take 1 capsule (100 mg) in the morning, and 3 capsules (300 mg) at bedtime. 120 capsule 1  . glucose blood (ONE TOUCH ULTRA TEST) test strip Use up to 4 times/day 100 each 12  . Insulin Glargine (BASAGLAR KWIKPEN) 100 UNIT/ML SOPN INJECT 0.24 MLS (24 UNITS TOTAL) INTO THE SKIN AT BEDTIME. 15 pen 2  . Insulin Pen Needle 32G X 4 MM MISC 1 Units by Does not apply route every morning. Pen needles 90 each 3  . INVOKANA 100 MG TABS tablet TAKE 1 TABLET (100 MG TOTAL) BY MOUTH DAILY BEFORE BREAKFAST. (Patient taking differently: 300 mg. ) 30 tablet 0  . lidocaine-prilocaine (EMLA) cream Apply to affected area once 30 g 3  . metFORMIN (GLUCOPHAGE) 500 MG tablet TAKE 2 TABLETS (1,000 MG TOTAL) BY MOUTH 2 (TWO) TIMES DAILY WITH A MEAL. 120 tablet 1  . nystatin (MYCOSTATIN/NYSTOP) powder Apply topically 4 (four) times daily. 30 g 0  . ondansetron (ZOFRAN) 8 MG tablet Take 1 tablet (8 mg total) by mouth 2 (two) times daily as needed. Start on the third day after chemotherapy. (Patient not taking: Reported on 11/09/2017) 30 tablet 1  . prochlorperazine (COMPAZINE) 10 MG tablet Take 1 tablet (10 mg total) by mouth every 6 (six) hours as needed (Nausea or vomiting). (Patient not taking: Reported on 11/09/2017) 30 tablet 1   No current facility-administered medications for this visit.    Facility-Administered Medications Ordered in Other Visits  Medication Dose Route Frequency Provider Last Rate Last Dose  .  heparin lock flush 100 unit/mL  500 Units Intravenous Once Lequita Asal, MD        Review of Systems  Constitutional: Positive for malaise/fatigue and weight loss (down 1 pound). Negative for diaphoresis and fever.       " My body just feels wore out".  HENT: Negative.   Eyes: Negative.   Respiratory: Positive for cough (chronic) and shortness of breath (exertional; improved with MDI). Negative for hemoptysis and sputum production.        Smokes 0.5 ppd  Cardiovascular: Negative for chest pain, palpitations, orthopnea, leg swelling and PND.  Gastrointestinal: Negative for  abdominal pain, blood in stool, constipation, diarrhea, melena, nausea and vomiting.  Genitourinary: Negative for dysuria, frequency, hematuria and urgency.  Musculoskeletal: Positive for joint pain (knees and hips). Negative for back pain, falls and myalgias.  Skin: Negative for itching and rash.  Neurological: Positive for sensory change (stable neuropathy in hands and feet; improved with chemo dose reduction). Negative for dizziness, tremors, weakness and headaches.  Endo/Heme/Allergies: Does not bruise/bleed easily.       Diabetes; glucometer broken  Psychiatric/Behavioral: Negative for depression, memory loss and suicidal ideas. The patient is not nervous/anxious and does not have insomnia.        Sleep quality has improved; sleeps "hard".  All other systems reviewed and are negative.  Performance status (ECOG): 1 - Symptomatic but completely ambulatory  Vital signs BP 111/78 (BP Location: Right Arm, Patient Position: Sitting)   Pulse 99   Temp (!) 96.6 F (35.9 C) (Tympanic)   Resp 16   Wt 189 lb 6 oz (85.9 kg)   LMP  (LMP Unknown)   BMI 30.57 kg/m   Physical Exam  Constitutional: She is oriented to person, place, and time and well-developed, well-nourished, and in no distress.  HENT:  Head: Normocephalic and atraumatic.  Alopecia.  Eyes: Pupils are equal, round, and reactive to light. EOM are  normal. No scleral icterus.  Glasses.  Hazel eyes.  Neck: Normal range of motion. Neck supple. No tracheal deviation present. No thyromegaly present.  Cardiovascular: Normal rate, regular rhythm and normal heart sounds. Exam reveals no gallop and no friction rub.  No murmur heard. Pulmonary/Chest: Effort normal and breath sounds normal. No respiratory distress. She has no wheezes. She has no rales.  Abdominal: Soft. Bowel sounds are normal. She exhibits no distension. There is no tenderness.  Musculoskeletal: Normal range of motion. She exhibits no edema or tenderness.  Lymphadenopathy:    She has no cervical adenopathy.    She has no axillary adenopathy.       Right: No inguinal and no supraclavicular adenopathy present.       Left: No inguinal and no supraclavicular adenopathy present.  Neurological: She is alert and oriented to person, place, and time.  Skin: Skin is warm and dry. No rash noted. No erythema.  Psychiatric: Mood, affect and judgment normal.  Nursing note and vitals reviewed.   Infusion on 11/09/2017  Component Date Value Ref Range Status  . Magnesium 11/09/2017 1.8  1.7 - 2.4 mg/dL Final   Performed at [facility], [address], [place], [place] [address]  . Sodium 11/09/2017 139  135 - 145 mmol/L Final  . Potassium 11/09/2017 3.7  3.5 - 5.1 mmol/L Final  . Chloride 11/09/2017 105  98 - 111 mmol/L Final  . CO2 11/09/2017 26  22 - 32 mmol/L Final  . Glucose, Bld 11/09/2017 225* 70 - 99 mg/dL Final  . BUN 11/09/2017 12  6 - 20 mg/dL Final  . Creatinine, Ser 11/09/2017 0.42* 0.44 - 1.00 mg/dL Final  . Calcium 11/09/2017 9.0  8.9 - 10.3 mg/dL Final  . Total Protein 11/09/2017 6.5  6.5 - 8.1 g/dL Final  . Albumin 11/09/2017 3.7  3.5 - 5.0 g/dL Final  . AST 11/09/2017 24  15 - 41 U/L Final  . ALT 11/09/2017 25  0 - 44 U/L Final  . Alkaline Phosphatase 11/09/2017 66  38 - 126 U/L Final  . Total Bilirubin 11/09/2017 0.7  0.3 - 1.2 mg/dL Final  . GFR calc non  Af  Amer 11/09/2017 >60  >60 mL/min Final  . GFR calc Af Amer 11/09/2017 >60  >60 mL/min Final   Comment: (NOTE) The eGFR has been calculated using the CKD EPI equation. This calculation has not been validated in all clinical situations. eGFR's persistently <60 mL/min signify possible Chronic Kidney Disease.   Georgiann Hahn gap 11/09/2017 8  5 - 15 Final   Performed at [facility], [address] [place], [place] [address]  . WBC 11/09/2017 8.7  3.6 - 11.0 K/uL Final  . RBC 11/09/2017 3.63* 3.80 - 5.20 MIL/uL Final  . Hemoglobin 11/09/2017 12.4  12.0 - 16.0 g/dL Final  . HCT 11/09/2017 36.6  35.0 - 47.0 % Final  . MCV 11/09/2017 101.0* 80.0 - 100.0 fL Final  . MCH 11/09/2017 34.2* 26.0 - 34.0 pg Final  . MCHC 11/09/2017 33.9  32.0 - 36.0 g/dL Final  . RDW 11/09/2017 16.1* 11.5 - 14.5 % Final  . Platelets 11/09/2017 285  150 - 440 K/uL Final  . Neutrophils Relative % 11/09/2017 84  % Final  . Neutro Abs 11/09/2017 7.3* 1.4 - 6.5 K/uL Final  . Lymphocytes Relative 11/09/2017 10  % Final  . Lymphs Abs 11/09/2017 0.9* 1.0 - 3.6 K/uL Final  . Monocytes Relative 11/09/2017 5  % Final  . Monocytes Absolute 11/09/2017 0.4  0.2 - 0.9 K/uL Final  . Eosinophils Relative 11/09/2017 0  % Final  . Eosinophils Absolute 11/09/2017 0.0  0 - 0.7 K/uL Final  . Basophils Relative 11/09/2017 1  % Final  . Basophils Absolute 11/09/2017 0.1  0 - 0.1 K/uL Final   Performed at [facility], [address], [place], [place] [address]    Assessment:  JAYONA MCCAIG is a 52 y.o. female with clinical stage IIIA (T3N1Mx) left breast cancer s/p biopsy on 06/14/2017.  Pathology revealed grade III invasive ductal carcinoma.  Axillary FNA revealed malignant cells c/w metastatic carcinoma. Tumor was ER + (90%), PR + (30%), Her2/neu - and Ki67 70%.  CA27.29 was 7.8 on 06/14/2017.  Bilateral mammogram on 06/12/2017 revealed a 3.5 cm irregular mass with associated architectural distortion within the  retroareolar left breast centered at the 3-4 o'clock axis.  There was probable surrounding satellite masses extending to overall measurement of 5.5 cm.  There were no masses in the right breast.  Utrasound on 06/14/2017 revealed a 2.4 x 4.0 x 5.1 cm hypoechoic mass in the retroareolar area.  The mass extended to the pectoralis fascia.  There was a prominent lymph node measuring 1.06 x 1.32 x 1.33 cm with cortical thickening.   Echo on 06/28/2017 revealed an EF of 60-65%  Chest, abdomen, and pelvis CT on 06/29/2017 revealed a LEFT breast mass measuring approximately 3.7 x 4.4 x 4.2 cm. There were borderline enlarged LEFT axillary and left internal mammary lymph nodes.  There was an infiltrative heterogeneously enhancing mass in the cervical region extending into the lower uterine segment that was concerning for a primary cervical carcinoma.  PET scan on 07/17/2017 revealed hypermetabolic central left breast mass (SUV 8.3) compatible with malignancy.  There was subtle accentuated activity in a small left axillary lymph node, but below background blood pool activity.  The cervical prominence noted on recent CT was not appreciably hypermetabolic.  Pelvic MRI on 10/27/2017 revealed no endometrial or cervical mass.  There were multiple nabothian cysts within the wall of the cervix.  Cervical biopsy on 07/05/2017 revealed an inflamed polyp with tubal metaplasia and focal grandular atypia.  There was no evidence of malignancy.  Breast ultrasound on 09/12/2017 revealed an excellent response to neoadjuvant chemotherapy [facility]). Follow up ultrasound showed a 1.98 x 3.60 x 3.92 cm (previously 2.38 x 3.99 x 5.12 cm) mass at the 5 o'clock position, representing a 43% overall reduction in volume. LEFT axillary lymph node visible at the level of the manubrium that measured 1.43 x 1.75 x 2.27 cm.   She received 4 cycles of AC with Neulasta support (07/20/2017 - 08/31/2017). She is s/p cycle #8 neoadjuvant Taxol  (09/14/2017 - 11/02/2017).   She has a family history of breast cancer.  She is premenopausal.  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   Symptomatically, patient is doing well overall.  She remains fatigued.  Neuropathy has improved with the reduction of her chemotherapy dose.  Pain and nausea are well controlled.  Shortness of breath has improved.  Unsure of blood glucose levels in the recent past due to her glucometer being broken.  Exam is grossly unremarkable.  WBC 8700 ([place] 7300).  Blood sugar elevated at 225.  Plan: 1. Labs today:  CBC with diff, CMP, Mg. 2. Stage IIIa LEFT breast cancer Doing well overall. Tolerating treatments with minimal side effects.  Review interval follow-up consult with surgery. LEFT breast mass has reduced in size by 80% - now measures 1.5 x 1.6 x 1.7 cm (previously 2.0 x 3.6 x 3.9 cm).  LEFT axillary lymph node continues to be moderately enlarged at 1.3 x 1.67 x 1.70 cm (previously measured up to 2.2 cm in diameter). Labs reviewed. Blood counts stable and adequate enough for treatment. Will proceed with cycle #9 neoadjuvant paclitaxel. Discuss symptom management.  Patient has antiemetics and pain medications at home to use on a PRN basis. Patient  advising that the  prescribed interventions are adequate at this point. Continue all medications as previously prescribed.  3. Chemotherapy-induced neuropathy  Improvement noted with 20% paclitaxel dose reduction.  Patient has stable neuropathy in her hands and feet. She notes that her neuropathy does not impose any functional limitations, nor does it impede her ability to ambulate safely.  Continue gabapentin 200 mg in the morning, 100 mg midday, and 200 mg at bedtime (total daily dose 500 mg). 4. Hyperglycemia  Blood sugar elevated in clinic today at  225.  Patient not monitoring glucose levels at home due to her meter be broken.  Patient encouraged to follow-up with either PCP or endocrinology to discuss meter replacement.  Patient advising that she is compliant with her prescribed antidiabetic interventions. 5. Nutrition  Appetite continues to be labile.   Patient continues to lose weight. Her weight today is 189 lb 6 oz (85.9 kg); BMI of 30.57 kg/m.  Encouraged her to increase her intake of calorie and protein dense food choices and to consume small frequent meals.  6. RTC in 1 week for labs (CBC with diff, CMP, Mg), and week #10 paclitaxel.    7. RTC in 2 weeks for MD assessment, labs (CBC with diff, CMP, Mg), and week #11 paclitaxel.    Honor Loh, NP  11/09/2017, 12:05 PM   I saw and evaluated the patient, participating in the key portions of the service and reviewing pertinent diagnostic studies and records.  I reviewed the nurse practitioner's note and agree with the findings and the plan.  The assessment and plan were discussed with the  patient.  Multiple questions were asked by the patient and answered.   Nolon Stalls, MD 11/09/2017,12:05 PM

## 2017-11-09 NOTE — Progress Notes (Signed)
Pt in for follow up, denies any difficulties or concerns.  

## 2017-11-11 ENCOUNTER — Other Ambulatory Visit: Payer: Self-pay | Admitting: Urgent Care

## 2017-11-16 ENCOUNTER — Inpatient Hospital Stay: Payer: Managed Care, Other (non HMO)

## 2017-11-16 ENCOUNTER — Ambulatory Visit: Payer: Managed Care, Other (non HMO) | Admitting: Hematology and Oncology

## 2017-11-16 ENCOUNTER — Other Ambulatory Visit: Payer: Self-pay | Admitting: Hematology and Oncology

## 2017-11-16 VITALS — BP 100/64 | HR 100 | Temp 97.6°F | Resp 18

## 2017-11-16 DIAGNOSIS — Z170 Estrogen receptor positive status [ER+]: Principal | ICD-10-CM

## 2017-11-16 DIAGNOSIS — C50812 Malignant neoplasm of overlapping sites of left female breast: Secondary | ICD-10-CM

## 2017-11-16 DIAGNOSIS — C50512 Malignant neoplasm of lower-outer quadrant of left female breast: Secondary | ICD-10-CM

## 2017-11-16 LAB — COMPREHENSIVE METABOLIC PANEL
ALT: 26 U/L (ref 0–44)
AST: 23 U/L (ref 15–41)
Albumin: 3.9 g/dL (ref 3.5–5.0)
Alkaline Phosphatase: 60 U/L (ref 38–126)
Anion gap: 9 (ref 5–15)
BUN: 11 mg/dL (ref 6–20)
CO2: 25 mmol/L (ref 22–32)
Calcium: 9.4 mg/dL (ref 8.9–10.3)
Chloride: 105 mmol/L (ref 98–111)
Creatinine, Ser: 0.36 mg/dL — ABNORMAL LOW (ref 0.44–1.00)
GFR calc Af Amer: >60 mL/min (ref >60)
GFR calc non Af Amer: >60 mL/min (ref >60)
Glucose, Bld: 171 mg/dL — ABNORMAL HIGH (ref 70–99)
Potassium: 3.9 mmol/L (ref 3.5–5.1)
Sodium: 139 mmol/L (ref 135–145)
Total Bilirubin: 0.8 mg/dL (ref 0.3–1.2)
Total Protein: 6.4 g/dL — ABNORMAL LOW (ref 6.5–8.1)

## 2017-11-16 LAB — CBC WITH DIFFERENTIAL/PLATELET
Abs Immature Granulocytes: 0.03 10*3/uL (ref 0.00–0.07)
Basophils Absolute: 0.1 10*3/uL (ref 0.0–0.1)
Basophils Relative: 1 %
Eosinophils Absolute: 0 10*3/uL (ref 0.0–0.5)
Eosinophils Relative: 1 %
HCT: 38.8 % (ref 36.0–46.0)
Hemoglobin: 12.7 g/dL (ref 12.0–15.0)
Immature Granulocytes: 0 %
Lymphocytes Relative: 16 %
Lymphs Abs: 1.1 10*3/uL (ref 0.7–4.0)
MCH: 33.3 pg (ref 26.0–34.0)
MCHC: 32.7 g/dL (ref 30.0–36.0)
MCV: 101.8 fL — ABNORMAL HIGH (ref 80.0–100.0)
Monocytes Absolute: 0.4 10*3/uL (ref 0.1–1.0)
Monocytes Relative: 6 %
Neutro Abs: 5.5 10*3/uL (ref 1.7–7.7)
Neutrophils Relative %: 76 %
Platelets: 297 10*3/uL (ref 150–400)
RBC: 3.81 MIL/uL — ABNORMAL LOW (ref 3.87–5.11)
RDW: 15.3 % (ref 11.5–15.5)
WBC: 7.1 10*3/uL (ref 4.0–10.5)
nRBC: 0 % (ref 0.0–0.2)

## 2017-11-16 LAB — MAGNESIUM: Magnesium: 2.1 mg/dL (ref 1.7–2.4)

## 2017-11-16 MED ORDER — SODIUM CHLORIDE 0.9 % IV SOLN
65.0000 mg/m2 | Freq: Once | INTRAVENOUS | Status: AC
  Administered 2017-11-16: 132 mg via INTRAVENOUS
  Filled 2017-11-16: qty 22

## 2017-11-16 MED ORDER — HEPARIN SOD (PORK) LOCK FLUSH 100 UNIT/ML IV SOLN
500.0000 [IU] | Freq: Once | INTRAVENOUS | Status: AC
  Administered 2017-11-16: 500 [IU] via INTRAVENOUS
  Filled 2017-11-16: qty 5

## 2017-11-16 MED ORDER — SODIUM CHLORIDE 0.9% FLUSH
10.0000 mL | INTRAVENOUS | Status: DC | PRN
  Administered 2017-11-16: 10 mL via INTRAVENOUS
  Filled 2017-11-16: qty 10

## 2017-11-16 MED ORDER — SODIUM CHLORIDE 0.9 % IV SOLN
20.0000 mg | Freq: Once | INTRAVENOUS | Status: AC
  Administered 2017-11-16: 20 mg via INTRAVENOUS
  Filled 2017-11-16: qty 2

## 2017-11-16 MED ORDER — FAMOTIDINE IN NACL 20-0.9 MG/50ML-% IV SOLN
20.0000 mg | Freq: Once | INTRAVENOUS | Status: AC
  Administered 2017-11-16: 20 mg via INTRAVENOUS
  Filled 2017-11-16: qty 50

## 2017-11-16 MED ORDER — SODIUM CHLORIDE 0.9 % IV SOLN
Freq: Once | INTRAVENOUS | Status: AC
  Administered 2017-11-16: 11:00:00 via INTRAVENOUS
  Filled 2017-11-16: qty 250

## 2017-11-16 MED ORDER — DIPHENHYDRAMINE HCL 50 MG/ML IJ SOLN
50.0000 mg | Freq: Once | INTRAMUSCULAR | Status: AC
  Administered 2017-11-16: 50 mg via INTRAVENOUS
  Filled 2017-11-16: qty 1

## 2017-11-23 ENCOUNTER — Inpatient Hospital Stay (HOSPITAL_BASED_OUTPATIENT_CLINIC_OR_DEPARTMENT_OTHER): Payer: 59 | Admitting: Hematology and Oncology

## 2017-11-23 ENCOUNTER — Inpatient Hospital Stay: Payer: Managed Care, Other (non HMO)

## 2017-11-23 ENCOUNTER — Encounter: Payer: Self-pay | Admitting: Hematology and Oncology

## 2017-11-23 VITALS — BP 117/79 | HR 112 | Temp 98.4°F | Resp 18 | Wt 190.1 lb

## 2017-11-23 DIAGNOSIS — Z794 Long term (current) use of insulin: Secondary | ICD-10-CM

## 2017-11-23 DIAGNOSIS — R530 Neoplastic (malignant) related fatigue: Secondary | ICD-10-CM

## 2017-11-23 DIAGNOSIS — Z803 Family history of malignant neoplasm of breast: Secondary | ICD-10-CM

## 2017-11-23 DIAGNOSIS — E1165 Type 2 diabetes mellitus with hyperglycemia: Secondary | ICD-10-CM

## 2017-11-23 DIAGNOSIS — G620 Drug-induced polyneuropathy: Secondary | ICD-10-CM | POA: Diagnosis not present

## 2017-11-23 DIAGNOSIS — T451X5A Adverse effect of antineoplastic and immunosuppressive drugs, initial encounter: Secondary | ICD-10-CM

## 2017-11-23 DIAGNOSIS — Z8249 Family history of ischemic heart disease and other diseases of the circulatory system: Secondary | ICD-10-CM

## 2017-11-23 DIAGNOSIS — F17210 Nicotine dependence, cigarettes, uncomplicated: Secondary | ICD-10-CM

## 2017-11-23 DIAGNOSIS — Z170 Estrogen receptor positive status [ER+]: Secondary | ICD-10-CM | POA: Diagnosis not present

## 2017-11-23 DIAGNOSIS — Z95828 Presence of other vascular implants and grafts: Secondary | ICD-10-CM

## 2017-11-23 DIAGNOSIS — Z79899 Other long term (current) drug therapy: Secondary | ICD-10-CM

## 2017-11-23 DIAGNOSIS — Z7984 Long term (current) use of oral hypoglycemic drugs: Secondary | ICD-10-CM

## 2017-11-23 DIAGNOSIS — C50512 Malignant neoplasm of lower-outer quadrant of left female breast: Secondary | ICD-10-CM

## 2017-11-23 DIAGNOSIS — R0602 Shortness of breath: Secondary | ICD-10-CM

## 2017-11-23 LAB — COMPREHENSIVE METABOLIC PANEL
ALT: 27 U/L (ref 0–44)
AST: 20 U/L (ref 15–41)
Albumin: 3.8 g/dL (ref 3.5–5.0)
Alkaline Phosphatase: 78 U/L (ref 38–126)
Anion gap: 11 (ref 5–15)
BUN: 11 mg/dL (ref 6–20)
CO2: 25 mmol/L (ref 22–32)
Calcium: 9.6 mg/dL (ref 8.9–10.3)
Chloride: 102 mmol/L (ref 98–111)
Creatinine, Ser: 0.53 mg/dL (ref 0.44–1.00)
GFR calc Af Amer: >60 mL/min (ref >60)
GFR calc non Af Amer: >60 mL/min (ref >60)
Glucose, Bld: 226 mg/dL — ABNORMAL HIGH (ref 70–99)
Potassium: 3.8 mmol/L (ref 3.5–5.1)
Sodium: 138 mmol/L (ref 135–145)
Total Bilirubin: 0.9 mg/dL (ref 0.3–1.2)
Total Protein: 6.3 g/dL — ABNORMAL LOW (ref 6.5–8.1)

## 2017-11-23 LAB — CBC WITH DIFFERENTIAL/PLATELET
Abs Immature Granulocytes: 0.05 10*3/uL (ref 0.00–0.07)
Basophils Absolute: 0.1 10*3/uL (ref 0.0–0.1)
Basophils Relative: 1 %
Eosinophils Absolute: 0 10*3/uL (ref 0.0–0.5)
Eosinophils Relative: 0 %
HCT: 39.2 % (ref 36.0–46.0)
Hemoglobin: 13 g/dL (ref 12.0–15.0)
Immature Granulocytes: 1 %
Lymphocytes Relative: 14 %
Lymphs Abs: 1.2 10*3/uL (ref 0.7–4.0)
MCH: 33.2 pg (ref 26.0–34.0)
MCHC: 33.2 g/dL (ref 30.0–36.0)
MCV: 100 fL (ref 80.0–100.0)
Monocytes Absolute: 0.5 10*3/uL (ref 0.1–1.0)
Monocytes Relative: 5 %
Neutro Abs: 7.3 10*3/uL (ref 1.7–7.7)
Neutrophils Relative %: 79 %
Platelets: 257 10*3/uL (ref 150–400)
RBC: 3.92 MIL/uL (ref 3.87–5.11)
RDW: 15.2 % (ref 11.5–15.5)
WBC: 9.1 10*3/uL (ref 4.0–10.5)
nRBC: 0 % (ref 0.0–0.2)

## 2017-11-23 LAB — MAGNESIUM: Magnesium: 1.9 mg/dL (ref 1.7–2.4)

## 2017-11-23 MED ORDER — SODIUM CHLORIDE 0.9% FLUSH
10.0000 mL | Freq: Once | INTRAVENOUS | Status: AC
  Administered 2017-11-23: 10 mL via INTRAVENOUS
  Filled 2017-11-23: qty 10

## 2017-11-23 MED ORDER — HEPARIN SOD (PORK) LOCK FLUSH 100 UNIT/ML IV SOLN
500.0000 [IU] | Freq: Once | INTRAVENOUS | Status: AC
  Administered 2017-11-23: 500 [IU] via INTRAVENOUS

## 2017-11-23 NOTE — Progress Notes (Signed)
[facility] day:  11/23/2017  Chief Complaint: Carolyn Roy is a 52 y.o. female with clinical stage IIIA left breast cancer who is seen for assessment prior to cycle #11 neoadjuvant Taxol.  HPI:  The patient was last seen in the medical oncology clinic on 11/09/2017.  At that time, she was doing well overall.  She remained fatigued.  Neuropathy had improved with the reduction of her chemotherapy dose.  Pain and nausea were well controlled.  Shortness of breath had improved.  She was unsure of blood glucose levels in the recent past due to her glucometer being broken.  Exam was grossly unremarkable.  WBC was 8700 ([place] 7300).  Blood sugar was 225.  She received cycle #9 Taxol.  She received cycle #10 Taxol on 11/16/2017.  During the interim, patient is doing well. She notes that her neuropathy has improved. She states, "the neuropathy has gotten better since you dialed the chemo back". She advises that her arms often feel like they are "going to sleep" for a couple of weeks.  She feels like she is "frost bitten".  Numbness is from elbow to wrist (R>L), and in her BILATERAL lower extremities (ankle to knee, sometimes to hip). Fingertips are numb. She is not dropping things, but she has difficulties with fine motor skills (i.e. buttons and zippers). She describes potions of her of her arms and legs as being "sensitive".   Patient denies that she has experienced any B symptoms. She denies any interval infections. Patient has developed a recurrent hordeolum again in her LEFT eye. She has been seen by her ophthalmologist and advised to use warm compresses only at this point. Patient denies any associated exudative drainage form her eyes.  Patient advises that she maintains an adequate appetite. She is eating well. Weight today is 190 lb 1 oz (86.2 kg), which compared to her last visit to the clinic, represents a 1 pound increase.     Patient denies pain in  the clinic today.   Past Medical History:  Diagnosis Date  . Cancer (HCC)    BREAST  . Depression   . Diabetes mellitus without complication ([place]) 5621  . Endometriosis   . Family history of breast cancer   . Hyperlipidemia   . Hypertension   . Ovarian mass   . Pneumonia     Past Surgical History:  Procedure Laterality Date  . AXILLARY LYMPH NODE BIOPSY Left 07/14/2017   Procedure: INSERTION GEL MARK CLIP LEFT AXILLA;  Surgeon: Robert Bellow, MD;  Location: [facility] ORS;  Service: General;  Laterality: Left;  . OOPHORECTOMY    . PORTACATH PLACEMENT Right 07/14/2017   Procedure: INSERTION PORT-A-CATH;  Surgeon: Robert Bellow, MD;  Location: [facility] ORS;  Service: General;  Laterality: Right;  . TUBAL LIGATION      Family History  Problem Relation Age of Onset  . Other Father        No info about father or paternal relatives  . Diabetes Brother   . Pancreatitis Brother   . Prostate cancer Brother 92       currently 41 / maternal half-brother  . Breast cancer Maternal Grandmother 40       deceased 21s  . Breast cancer Maternal Aunt 65       currently 54  . Breast cancer Other 64       mother's sister; deceased 85  . Breast cancer Other  mother's sister; age at dx unknown    Social History:  reports that she has been smoking cigarettes. She has a 5.50 pack-year smoking history. She has quit using smokeless tobacco.  Her smokeless tobacco use included snuff. She reports that she does not drink alcohol or use drugs.  She smokes 0.5-1 ppd x 11 years. She works at [employer] in [place].  Patient denies known exposures to radiation on toxins. Her fiance is Judee Clara.  The patient is alone today.   Allergies:  Allergies  Allergen Reactions  . Aspirin Nausea And Vomiting    Current Medications: Current Outpatient Medications  Medication Sig Dispense Refill  . albuterol (PROVENTIL HFA;VENTOLIN HFA) 108 (90 Base) MCG/ACT inhaler Inhale 2 puffs into  the lungs every 6 (six) hours as needed for wheezing or shortness of breath. 1 Inhaler 0  . atorvastatin (LIPITOR) 40 MG tablet Take 1 tablet (40 mg total) by mouth daily. 90 tablet 3  . gabapentin (NEURONTIN) 100 MG capsule Take 2 capsules (200 mg) in the morning, 1 capsule (100 mg) mid-day, and 2 capsules (200 mg) at bedtime. 150 capsule 1  . glucose blood (ONE TOUCH ULTRA TEST) test strip Use up to 4 times/day 100 each 12  . Insulin Glargine (BASAGLAR KWIKPEN) 100 UNIT/ML SOPN INJECT 0.24 MLS (24 UNITS TOTAL) INTO THE SKIN AT BEDTIME. 15 pen 2  . Insulin Pen Needle 32G X 4 MM MISC 1 Units by Does not apply route every morning. Pen needles 90 each 3  . INVOKANA 100 MG TABS tablet TAKE 1 TABLET (100 MG TOTAL) BY MOUTH DAILY BEFORE BREAKFAST. (Patient taking differently: 300 mg. ) 30 tablet 0  . lidocaine-prilocaine (EMLA) cream Apply to affected area once 30 g 3  . metFORMIN (GLUCOPHAGE) 500 MG tablet TAKE 2 TABLETS (1,000 MG TOTAL) BY MOUTH 2 (TWO) TIMES DAILY WITH A MEAL. 120 tablet 1  . nystatin (MYCOSTATIN/NYSTOP) powder Apply topically 4 (four) times daily. 30 g 0  . ondansetron (ZOFRAN) 8 MG tablet Take 1 tablet (8 mg total) by mouth 2 (two) times daily as needed. Start on the third day after chemotherapy. (Patient not taking: Reported on 11/09/2017) 30 tablet 1  . prochlorperazine (COMPAZINE) 10 MG tablet Take 1 tablet (10 mg total) by mouth every 6 (six) hours as needed (Nausea or vomiting). (Patient not taking: Reported on 11/09/2017) 30 tablet 1   No current facility-administered medications for this visit.    Facility-Administered Medications Ordered in Other Visits  Medication Dose Route Frequency Provider Last Rate Last Dose  . heparin lock flush 100 unit/mL  500 Units Intravenous Once Lequita Asal, MD        Review of Systems  Constitutional: Negative for chills, diaphoresis, fever, malaise/fatigue and weight loss (up 1 pound).       Feels "ok".  Neuropathy is the "biggest  thing".  HENT: Negative.  Negative for congestion, ear discharge, ear pain, nosebleeds, sinus pain, sore throat and tinnitus.   Eyes: Negative for blurred vision, double vision, photophobia and pain.       Left eye stye.  Respiratory: Positive for cough (chronic) and shortness of breath (exertional). Negative for hemoptysis and sputum production.        Smokes 0.5 ppd  Cardiovascular: Negative.  Negative for chest pain, palpitations, orthopnea, leg swelling and PND.  Gastrointestinal: Negative.  Negative for abdominal pain, blood in stool, constipation, diarrhea, melena, nausea and vomiting.  Genitourinary: Negative.  Negative for dysuria, frequency, hematuria and  urgency.  Musculoskeletal: Positive for joint pain (knees and hips). Negative for back pain, falls, myalgias and neck pain.  Skin: Negative.  Negative for itching and rash.  Neurological: Positive for sensory change (progressive neuropthy- see HPI.). Negative for dizziness, tremors, speech change, focal weakness, seizures, weakness and headaches.  Endo/Heme/Allergies: Does not bruise/bleed easily.       Diabetes.  Psychiatric/Behavioral: Negative for depression and memory loss. The patient is not nervous/anxious and does not have insomnia.   All other systems reviewed and are negative.  Performance status (ECOG): 1  Vital signs BP 117/79 (BP Location: Left Arm, Patient Position: Sitting)   Pulse (!) 112   Temp 98.4 F (36.9 C) (Tympanic)   Resp 18   Wt 190 lb 1 oz (86.2 kg)   LMP  (LMP Unknown)   BMI 30.68 kg/m   Physical Exam  Constitutional: She is oriented to person, place, and time and well-developed, well-nourished, and in no distress. No distress.  HENT:  Head: Normocephalic and atraumatic.  Mouth/Throat: Oropharynx is clear and moist. No oropharyngeal exudate.  Alopecia.  Eyes: Pupils are equal, round, and reactive to light. Conjunctivae and EOM are normal. No scleral icterus.  Glasses.  Hazel eyes.  Neck:  Normal range of motion. Neck supple. No JVD present.  Cardiovascular: Normal rate, regular rhythm and normal heart sounds. Exam reveals no gallop and no friction rub.  No murmur heard. Pulmonary/Chest: Effort normal and breath sounds normal. No respiratory distress. She has no wheezes. She has no rales.  Abdominal: Soft. Bowel sounds are normal. She exhibits no distension and no mass. There is no abdominal tenderness. There is no rebound and no guarding.  Musculoskeletal: Normal range of motion.        General: No tenderness or edema.  Lymphadenopathy:    She has no cervical adenopathy.    She has no axillary adenopathy.       Right: No inguinal and no supraclavicular adenopathy present.       Left: No inguinal and no supraclavicular adenopathy present.  Neurological: She is alert and oriented to person, place, and time. No sensory deficit (unable to determine position sense in toes).  Skin: Skin is warm and dry. No rash noted. She is not diaphoretic. No erythema. No pallor.  Psychiatric: Mood, affect and judgment normal.  Nursing note and vitals reviewed.   Infusion on 11/23/2017  Component Date Value Ref Range Status  . Magnesium 11/23/2017 1.9  1.7 - 2.4 mg/dL Final   Performed at [facility], [address], [place], [place] [address]  . Sodium 11/23/2017 138  135 - 145 mmol/L Final  . Potassium 11/23/2017 3.8  3.5 - 5.1 mmol/L Final  . Chloride 11/23/2017 102  98 - 111 mmol/L Final  . CO2 11/23/2017 25  22 - 32 mmol/L Final  . Glucose, Bld 11/23/2017 226* 70 - 99 mg/dL Final  . BUN 11/23/2017 11  6 - 20 mg/dL Final  . Creatinine, Ser 11/23/2017 0.53  0.44 - 1.00 mg/dL Final  . Calcium 11/23/2017 9.6  8.9 - 10.3 mg/dL Final  . Total Protein 11/23/2017 6.3* 6.5 - 8.1 g/dL Final  . Albumin 11/23/2017 3.8  3.5 - 5.0 g/dL Final  . AST 11/23/2017 20  15 - 41 U/L Final  . ALT 11/23/2017 27  0 - 44 U/L Final  . Alkaline Phosphatase 11/23/2017 78  38 - 126 U/L Final  . Total  Bilirubin 11/23/2017 0.9  0.3 - 1.2 mg/dL Final  .  GFR calc non Af Amer 11/23/2017 >60  >60 mL/min Final  . GFR calc Af Amer 11/23/2017 >60  >60 mL/min Final   Comment: (NOTE) The eGFR has been calculated using the CKD EPI equation. This calculation has not been validated in all clinical situations. eGFR's persistently <60 mL/min signify possible Chronic Kidney Disease.   Georgiann Hahn gap 11/23/2017 11  5 - 15 Final   Performed at [facility], [address] [place], [place] [address]  . WBC 11/23/2017 9.1  4.0 - 10.5 K/uL Final  . RBC 11/23/2017 3.92  3.87 - 5.11 MIL/uL Final  . Hemoglobin 11/23/2017 13.0  12.0 - 15.0 g/dL Final  . HCT 11/23/2017 39.2  36.0 - 46.0 % Final  . MCV 11/23/2017 100.0  80.0 - 100.0 fL Final  . MCH 11/23/2017 33.2  26.0 - 34.0 pg Final  . MCHC 11/23/2017 33.2  30.0 - 36.0 g/dL Final  . RDW 11/23/2017 15.2  11.5 - 15.5 % Final  . Platelets 11/23/2017 257  150 - 400 K/uL Final  . nRBC 11/23/2017 0.0  0.0 - 0.2 % Final  . Neutrophils Relative % 11/23/2017 79  % Final  . Neutro Abs 11/23/2017 7.3  1.7 - 7.7 K/uL Final  . Lymphocytes Relative 11/23/2017 14  % Final  . Lymphs Abs 11/23/2017 1.2  0.7 - 4.0 K/uL Final  . Monocytes Relative 11/23/2017 5  % Final  . Monocytes Absolute 11/23/2017 0.5  0.1 - 1.0 K/uL Final  . Eosinophils Relative 11/23/2017 0  % Final  . Eosinophils Absolute 11/23/2017 0.0  0.0 - 0.5 K/uL Final  . Basophils Relative 11/23/2017 1  % Final  . Basophils Absolute 11/23/2017 0.1  0.0 - 0.1 K/uL Final  . Immature Granulocytes 11/23/2017 1  % Final  . Abs Immature Granulocytes 11/23/2017 0.05  0.00 - 0.07 K/uL Final   Performed at [facility], [address] [place], [place] [address]    Assessment:  ELANAH OSMANOVIC is a 52 y.o. female with clinical stage IIIA (T3N1Mx) left breast cancer s/p biopsy on 06/14/2017.  Pathology revealed grade III invasive ductal carcinoma.  Axillary FNA revealed malignant cells c/w metastatic  carcinoma. Tumor was ER + (90%), PR + (30%), Her2/neu - and Ki67 70%.  CA27.29 was 7.8 on 06/14/2017.  Bilateral mammogram on 06/12/2017 revealed a 3.5 cm irregular mass with associated architectural distortion within the retroareolar left breast centered at the 3-4 o'clock axis.  There was probable surrounding satellite masses extending to overall measurement of 5.5 cm.  There were no masses in the right breast.  Utrasound on 06/14/2017 revealed a 2.4 x 4.0 x 5.1 cm hypoechoic mass in the retroareolar area.  The mass extended to the pectoralis fascia.  There was a prominent lymph node measuring 1.06 x 1.32 x 1.33 cm with cortical thickening.   Echo on 06/28/2017 revealed an EF of 60-65%  Chest, abdomen, and pelvis CT on 06/29/2017 revealed a LEFT breast mass measuring approximately 3.7 x 4.4 x 4.2 cm. There were borderline enlarged LEFT axillary and left internal mammary lymph nodes.  There was an infiltrative heterogeneously enhancing mass in the cervical region extending into the lower uterine segment that was concerning for a primary cervical carcinoma.  PET scan on 07/17/2017 revealed hypermetabolic central left breast mass (SUV 8.3) compatible with malignancy.  There was subtle accentuated activity in a small left axillary lymph node, but below background blood pool activity.  The cervical prominence noted on recent CT  was not appreciably hypermetabolic.  Pelvic MRI on 10/27/2017 revealed no endometrial or cervical mass.  There were multiple nabothian cysts within the wall of the cervix.  Cervical biopsy on 07/05/2017 revealed an inflamed polyp with tubal metaplasia and focal grandular atypia.  There was no evidence of malignancy.  Breast ultrasound on 09/12/2017 revealed an excellent response to neoadjuvant chemotherapy [facility]). Follow up ultrasound showed a 1.98 x 3.60 x 3.92 cm (previously 2.38 x 3.99 x 5.12 cm) mass at the 5 o'clock position, representing a 43% overall reduction in volume. LEFT  axillary lymph node visible at the level of the manubrium that measured 1.43 x 1.75 x 2.27 cm.   She received 4 cycles of AC with Neulasta support (07/20/2017 - 08/31/2017). She is s/p cycle #10 neoadjuvant Taxol (09/14/2017 - 11/16/2017).  Taxol was dose reduced on cycle #8 secondary to a progressive neuropathy.  She has a family history of breast cancer.  She is premenopausal.  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   Symptomatically, she has a grade III neuropathy.  Symptoms have transiently extended proximally to elbows and knees.  She is unable to determine position sense in her toes.  Plan: 1. Labs today:  CBC with diff, CMP, Mg. 2. Stage IIIa LEFT breast cancer Progressive neuropathy in lower extremities. Patient has received 10 cycles of Taxol. Last 3 cycles have been dose reduced. Discuss concern for progressive and possibly permanent neuropathy. Discuss postponing chemotherapy or discontinuation of chemotherapy. After some discussion, patient wishes to discontinue treatment. No further chemotherapy. Discuss need to follow-up with surgery (Dr. Bary Castilla) to discuss plans for surgery- patient wishes to wait until after Thanksgiving. .  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 Discontinue Taxol (see above). Continue gabapentin 200 mg in the morning, 100 mg midday, and 200 mg at bedtime (total daily dose 500 mg). 4. Hyperglycemia Blood sugar is 226 today. Encourage continued monitoring and management. 5. Nutrition Weight up 1 pound. Discuss caloric intake and supplements. 6. RTC 2-3 weeks after surgery.  Patient to call for follow-up appointment.   Honor Loh, NP  11/23/2017, 12:36 PM   I saw and evaluated the patient, participating in the key portions of the service and reviewing pertinent  diagnostic studies and records.  I reviewed the nurse practitioner's note and agree with the findings and the plan.  The assessment and plan were discussed with the patient.  Multiple questions were asked by the patient and answered.   Nolon Stalls, MD 11/23/2017,12:36 PM

## 2017-11-23 NOTE — Progress Notes (Signed)
Patient states she is doing much better since her Taxol dosage has been reduced.

## 2017-11-24 ENCOUNTER — Other Ambulatory Visit: Payer: Self-pay | Admitting: Unknown Physician Specialty

## 2017-12-12 ENCOUNTER — Telehealth: Payer: Self-pay | Admitting: *Deleted

## 2017-12-12 ENCOUNTER — Other Ambulatory Visit: Payer: Self-pay | Admitting: Nurse Practitioner

## 2017-12-12 ENCOUNTER — Other Ambulatory Visit: Payer: Self-pay | Admitting: Urgent Care

## 2017-12-12 MED ORDER — TRAMADOL HCL 50 MG PO TABS: 50.0000 mg | ORAL_TABLET | Freq: Four times a day (QID) | ORAL | 0 refills | Status: DC | PRN

## 2017-12-12 NOTE — Telephone Encounter (Signed)
Patient called and states she is having pain all over and she cannot take it. She is hurting worse since she finished chemotherapy a month ago. Please schedule her with [facility]

## 2017-12-12 NOTE — Telephone Encounter (Signed)
  I think we should get her in to see neurology.  Is she on gabapentin?  M

## 2017-12-12 NOTE — Telephone Encounter (Signed)
Per Lauren Allen's request, called patient back to see if she needed something for pain to get her through the night. Patient stated that she hurts all over with 7 out of 10 pain that feels like it is in her bones. Her hands and feet are completely numb and she can hardly put shoes on. Walking is also very difficult. It is difficult for her to sleep and she has been taking Tylenol PM with little success. She would appreciate something called in for pain until she can get here to be seen in symptom management clinic tomorrow.     dhs

## 2017-12-12 NOTE — Telephone Encounter (Signed)
Called patient and scheduled her to see Beckey Rutter, NP in the symptom management clinic tomorrow at 9:00.

## 2017-12-13 ENCOUNTER — Inpatient Hospital Stay: Payer: 59

## 2017-12-13 ENCOUNTER — Encounter: Payer: Self-pay | Admitting: Nurse Practitioner

## 2017-12-13 ENCOUNTER — Inpatient Hospital Stay: Payer: 59 | Attending: Nurse Practitioner | Admitting: Nurse Practitioner

## 2017-12-13 ENCOUNTER — Other Ambulatory Visit: Payer: Self-pay

## 2017-12-13 VITALS — BP 108/74 | HR 94 | Temp 96.9°F | Resp 18 | Wt 183.0 lb

## 2017-12-13 DIAGNOSIS — M791 Myalgia, unspecified site: Secondary | ICD-10-CM | POA: Insufficient documentation

## 2017-12-13 DIAGNOSIS — Z170 Estrogen receptor positive status [ER+]: Secondary | ICD-10-CM | POA: Diagnosis not present

## 2017-12-13 DIAGNOSIS — Z79899 Other long term (current) drug therapy: Secondary | ICD-10-CM | POA: Diagnosis not present

## 2017-12-13 DIAGNOSIS — C50512 Malignant neoplasm of lower-outer quadrant of left female breast: Secondary | ICD-10-CM | POA: Insufficient documentation

## 2017-12-13 DIAGNOSIS — T451X5A Adverse effect of antineoplastic and immunosuppressive drugs, initial encounter: Secondary | ICD-10-CM

## 2017-12-13 DIAGNOSIS — Z794 Long term (current) use of insulin: Secondary | ICD-10-CM | POA: Diagnosis not present

## 2017-12-13 DIAGNOSIS — Z7984 Long term (current) use of oral hypoglycemic drugs: Secondary | ICD-10-CM | POA: Diagnosis not present

## 2017-12-13 DIAGNOSIS — F17210 Nicotine dependence, cigarettes, uncomplicated: Secondary | ICD-10-CM | POA: Insufficient documentation

## 2017-12-13 DIAGNOSIS — Z8249 Family history of ischemic heart disease and other diseases of the circulatory system: Secondary | ICD-10-CM | POA: Insufficient documentation

## 2017-12-13 DIAGNOSIS — G620 Drug-induced polyneuropathy: Secondary | ICD-10-CM | POA: Insufficient documentation

## 2017-12-13 DIAGNOSIS — R5382 Chronic fatigue, unspecified: Secondary | ICD-10-CM

## 2017-12-13 DIAGNOSIS — E559 Vitamin D deficiency, unspecified: Secondary | ICD-10-CM

## 2017-12-13 DIAGNOSIS — M2550 Pain in unspecified joint: Secondary | ICD-10-CM | POA: Diagnosis not present

## 2017-12-13 DIAGNOSIS — E119 Type 2 diabetes mellitus without complications: Secondary | ICD-10-CM | POA: Diagnosis not present

## 2017-12-13 DIAGNOSIS — Z803 Family history of malignant neoplasm of breast: Secondary | ICD-10-CM

## 2017-12-13 LAB — TSH: TSH: 3.452 u[IU]/mL (ref 0.350–4.500)

## 2017-12-13 LAB — VITAMIN B12: Vitamin B-12: 637 pg/mL (ref 180–914)

## 2017-12-13 LAB — FOLATE: Folate: 20.5 ng/mL (ref >5.9)

## 2017-12-13 MED ORDER — TRAMADOL HCL 50 MG PO TABS: 50.0000 mg | ORAL_TABLET | Freq: Four times a day (QID) | ORAL | 0 refills | Status: DC | PRN

## 2017-12-13 MED ORDER — GABAPENTIN 300 MG PO CAPS: ORAL_CAPSULE | ORAL | 0 refills | Status: DC

## 2017-12-13 NOTE — Progress Notes (Signed)
Patient here to be evaluated in the symptom management clinic for all over body pain, which has gotten progressively worse since she finished her chemotherapy about a month ago.

## 2017-12-13 NOTE — Progress Notes (Signed)
Symptom [facility] [place]  [phone] [phone]) [phone]  Patient Care Team: Kathrine Haddock, NP as PCP - General (Nurse Practitioner) Robert Bellow, MD (General Surgery) [name], [name], [name] (Obstetrics and Gynecology)   Name of the patient: Carolyn Roy  [id number]  05/16/1965   Date of visit: 12/13/17  Diagnosis-clinical stage IIIa left breast cancer  Chief complaint/ Reason for visit- Pain  Heme/Onc history:  Oncology History   Carolyn Roy diagnosed with clinical stage IIIA (T3N1Mx) left breast cancer s/p biopsy on 06/14/2017. -Pathology revealed grade III invasive ductal carcinoma. -Axillary FNA revealed malignant cells c/w metastatic carcinoma. Tumor was ER + (90%), PR + (30%), Her2/neu - and Ki67 70%.  CA27.29 was 7.8 on 06/14/2017. -Bilateral mammogram on 06/12/2017 revealed a 3.5 cm irregular mass with associated architectural distortion within the retroareolar left breast centered at the 3-4 o'clock axis.  There was probable surrounding satellite masses extending to overall measurement of 5.5 cm. There were no masses in the right breast. Utrasound (06/14/2017)  revealed a 2.4 x 4.0 x 5.1 cm hypoechoic mass in the retroareolar area. The mass extended to the pectoralis fascia. There was a prominent lymph node measuring 1.06 x 1.32 x 1.33 cm with cortical thickening.  - Echo (06/28/2017) revealed an EF of 60-65% - CT C/A/P (06/29/2017) revealed a LEFT breast mass measuring approximately 3.7 x 4.4 x 4.2 cm. There were borderline enlarged LEFT axillary and left internal mammary LNs.  There was an infiltrative heterogeneously enhancing mass in the cervical region extending into the lower uterine segment that was concerning for a primary cervical carcinoma. - PET scan on 07/17/2017 revealed hypermetabolic central left breast mass (SUV 8.3) compatible with  malignancy. There was subtle accentuated activity in a small left axillary lymph node, but below background blood pool activity.  The cervical prominence noted on recent CT was not appreciably hypermetabolic. - Pelvic MRI (96/29/5284) revealed no endometrial or cervical mass.  There were multiple nabothian cysts within the wall of the cervix. - Cervical biopsy on 07/05/2017 revealed an inflamed polyp with tubal metaplasia and focal grandular atypia.  There was no evidence of malignancy. - Breast US (09/12/2017) revealed an excellent response to neoadjuvant chemotherapy [facility]). Follow up ultrasound showed a 1.98 x 3.60 x 3.92 cm (previously 2.38 x 3.99 x 5.12 cm) mass at the 5 o'clock position, representing a 43% overall reduction in volume. LEFT axillary lymph node visible at the level of the manubrium that measured 1.43 x 1.75 x 2.27 cm. - She has a family history of breast cancer.  She is premenopausal.  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  She received 4 cycles of AC with Neulasta support (07/20/2017 - 08/31/2017).   Initiated neoadjuvant Taxol on 09/14/2017.      Cancer of midline of breast, left ([place])   06/15/2017 Initial Diagnosis    Cancer of midline of breast, left ([place])    06/26/2017 -  Chemotherapy    The patient had DOXOrubicin (ADRIAMYCIN) chemo injection 122 mg, 60 mg/m2 = 122 mg, Intravenous,  Once, 4 of 4 cycles Administration: 122 mg (07/20/2017), 122 mg (08/03/2017), 122 mg (08/17/2017), 122 mg (08/31/2017) palonosetron (ALOXI) injection 0.25 mg, 0.25 mg, Intravenous,  Once, 4 of 4 cycles Administration: 0.25 mg (07/20/2017), 0.25 mg (08/03/2017), 0.25 mg (08/17/2017), 0.25 mg (08/31/2017) pegfilgrastim (  NEULASTA) injection 6 mg, 6 mg, Subcutaneous, Once, 5 of 5 cycles Administration: 6 mg (07/21/2017), 6 mg (08/04/2017), 6 mg  (08/18/2017), 6 mg (09/01/2017) cyclophosphamide (CYTOXAN) 1,220 mg in sodium chloride 0.9 % 250 mL chemo infusion, 600 mg/m2 = 1,220 mg, Intravenous,  Once, 4 of 4 cycles Administration: 1,220 mg (07/20/2017), 1,220 mg (08/03/2017), 1,220 mg (08/17/2017), 1,220 mg (08/31/2017) PACLitaxel (TAXOL) 162 mg in sodium chloride 0.9 % 250 mL chemo infusion (</= 24m/m2), 80 mg/m2 = 162 mg, Intravenous,  Once, 10 of 12 cycles Dose modification: 65 mg/m2 (original dose 80 mg/m2, Cycle 13, Reason: Provider Judgment, Comment: neuropathy) Administration: 162 mg (09/14/2017), 162 mg (09/21/2017), 162 mg (09/28/2017), 162 mg (10/05/2017), 162 mg (10/12/2017), 162 mg (10/19/2017), 162 mg (10/26/2017), 132 mg (11/02/2017), 132 mg (11/09/2017), 132 mg (11/16/2017) fosaprepitant (EMEND) 150 mg, dexamethasone (DECADRON) 12 mg in sodium chloride 0.9 % 145 mL IVPB, , Intravenous,  Once, 4 of 4 cycles Administration:  (07/20/2017),  (08/03/2017),  (08/17/2017),  (08/31/2017)  for chemotherapy treatment.       Interval history- Carolyn Roy 52year old female with above history of breast cancer, who presents to symptom management clinic for pain.  She states that over the past 2 weeks she has had pain of her hands feet and joints.  She states that over the past 48 hours pain has worsened and changed, now causing generalized aching pain throughout her body occurring intermittently day and night.  She has been taking Tylenol for pain and gabapentin for neuropathy.  She is not sure if this improves her pain.  She was prescribed a short course of tramadol due to her symptoms which she says significantly improved her symptoms overnight.  Nothing seems to make pain worse.  She has loss of grip strength in her hands due to neuropathy but says this is no worse.  She says she has been dropping plates and this is not worse.  She thinks that strength and coordination has not changed in past couple of days.  No fevers or chills.  Feels well otherwise.     Chart review reveals that she suffered chemotherapy-induced neuropathy and had improvement in symptoms with dose reduced Taxol.  She was previously taking gabapentin 200 mg in morning, 100 mg midday, and 200 mg at night.  She denies daytime sleepiness.  She has not had any vaginal bleeding since starting chemotherapy.  She questions if this could be menopause.  Denies hot flashes or night sweats.  She has not yet seen a neurologist. She is scheduled to meet with Dr. BBary Castillatomorrow for discussion of surgery. She continues to smoke.  She does take a statin.  No history or family history of fibromyalgia, autoimmune disorders, or RA. History of vitamin d deficiency and anemia.   ECOG FS:1 - Symptomatic but completely ambulatory  Review of systems- Review of Systems  Constitutional: Negative for chills, fever, malaise/fatigue and weight loss.  HENT: Negative for congestion, ear discharge, ear pain, sinus pain, sore throat and tinnitus.   Eyes: Negative.   Respiratory: Negative.  Negative for cough, sputum production and shortness of breath.   Cardiovascular: Negative for chest pain, palpitations, orthopnea, claudication and leg swelling.  Gastrointestinal: Negative for abdominal pain, blood in stool, constipation, diarrhea, heartburn, nausea and vomiting.  Genitourinary: Negative.   Musculoskeletal: Positive for joint pain and myalgias. Negative for back pain, falls and neck pain.  Skin: Negative.   Neurological: Negative for dizziness, tingling, weakness and headaches.  Endo/Heme/Allergies: Negative.   Psychiatric/Behavioral:  Negative.      Current treatment-cycle 10 of neoadjuvant Taxol on 11/16/2017: chemotherapy discontinued due to neuropathy  Allergies  Allergen Reactions  . Aspirin Nausea And Vomiting    Past Medical History:  Diagnosis Date  . Cancer (HCC)    BREAST  . Depression   . Diabetes mellitus without complication ([place]) 4562  . Endometriosis   . Family history of breast  cancer   . Hyperlipidemia   . Hypertension   . Ovarian mass   . Pneumonia     Past Surgical History:  Procedure Laterality Date  . AXILLARY LYMPH NODE BIOPSY Left 07/14/2017   Procedure: INSERTION GEL MARK CLIP LEFT AXILLA;  Surgeon: Robert Bellow, MD;  Location: [facility] ORS;  Service: General;  Laterality: Left;  . OOPHORECTOMY    . PORTACATH PLACEMENT Right 07/14/2017   Procedure: INSERTION PORT-A-CATH;  Surgeon: Robert Bellow, MD;  Location: [facility] ORS;  Service: General;  Laterality: Right;  . TUBAL LIGATION      Social History   Socioeconomic History  . Marital status: Single    Spouse name: Not on file  . Number of children: Not on file  . Years of education: Not on file  . Highest education level: Not on file  Occupational History  . Not on file  Social Needs  . Financial resource strain: Not on file  . Food insecurity:    Worry: Not on file    Inability: Not on file  . Transportation needs:    Medical: Not on file    Non-medical: Not on file  Tobacco Use  . Smoking status: Current Every Day Smoker    Packs/day: 0.50    Years: 11.00    Pack years: 5.50    Types: Cigarettes  . Smokeless tobacco: Former Systems developer    Types: Snuff  Substance and Sexual Activity  . Alcohol use: No    Alcohol/week: 0.0 standard drinks  . Drug use: No  . Sexual activity: Yes  Lifestyle  . Physical activity:    Days per week: Not on file    Minutes per session: Not on file  . Stress: Not on file  Relationships  . Social connections:    Talks on phone: Not on file    Gets together: Not on file    Attends religious service: Not on file    Active member of club or organization: Not on file    Attends meetings of clubs or organizations: Not on file    Relationship status: Not on file  . Intimate partner violence:    Fear of current or ex partner: Not on file    Emotionally abused: Not on file    Physically abused: Not on file    Forced sexual activity: Not on file  Other  Topics Concern  . Not on file  Social History Narrative  . Not on file    Family History  Problem Relation Age of Onset  . Other Father        No info about father or paternal relatives  . Diabetes Brother   . Pancreatitis Brother   . Prostate cancer Brother 22       currently 22 / maternal half-brother  . Breast cancer Maternal Grandmother 40       deceased 70s  . Breast cancer Maternal Aunt 65       currently 63  . Breast cancer Other 60       mother's sister; deceased 23  .  Breast cancer Other        mother's sister; age at dx unknown     Current Outpatient Medications:  .  albuterol (PROVENTIL HFA;VENTOLIN HFA) 108 (90 Base) MCG/ACT inhaler, Inhale 2 puffs into the lungs every 6 (six) hours as needed for wheezing or shortness of breath., Disp: 1 Inhaler, Rfl: 0 .  atorvastatin (LIPITOR) 40 MG tablet, Take 1 tablet (40 mg total) by mouth daily., Disp: 90 tablet, Rfl: 3 .  gabapentin (NEURONTIN) 300 MG capsule, Take 1 Capsule (335m) by mouth in the morning, 1 capsule (3072m mid-day, and 3 capsules (900 mg) at bedtime for nerve pain., Disp: 150 capsule, Rfl: 0 .  glucose blood (ONE TOUCH ULTRA TEST) test strip, Use up to 4 times/day, Disp: 100 each, Rfl: 12 .  Insulin Glargine (BASAGLAR KWIKPEN) 100 UNIT/ML SOPN, INJECT 0.24 MLS (24 UNITS TOTAL) INTO THE SKIN AT BEDTIME., Disp: 15 pen, Rfl: 2 .  Insulin Pen Needle 32G X 4 MM MISC, 1 Units by Does not apply route every morning. Pen needles, Disp: 90 each, Rfl: 3 .  INVOKANA 100 MG TABS tablet, TAKE 1 TABLET (100 MG TOTAL) BY MOUTH DAILY BEFORE BREAKFAST. (Patient taking differently: 300 mg. ), Disp: 30 tablet, Rfl: 0 .  lidocaine-prilocaine (EMLA) cream, Apply to affected area once, Disp: 30 g, Rfl: 3 .  metFORMIN (GLUCOPHAGE) 500 MG tablet, TAKE 2 TABLETS (1,000 MG TOTAL) BY MOUTH 2 (TWO) TIMES DAILY WITH A MEAL., Disp: 120 tablet, Rfl: 1 .  ondansetron (ZOFRAN) 8 MG tablet, Take 1 tablet (8 mg total) by mouth 2 (two) times  daily as needed. Start on the third day after chemotherapy., Disp: 30 tablet, Rfl: 1 .  prochlorperazine (COMPAZINE) 10 MG tablet, Take 1 tablet (10 mg total) by mouth every 6 (six) hours as needed (Nausea or vomiting)., Disp: 30 tablet, Rfl: 1 .  traMADol (ULTRAM) 50 MG tablet, Take 1 tablet (50 mg total) by mouth every 6 (six) hours as needed for severe pain., Disp: 30 tablet, Rfl: 0  Physical exam:  Vitals:   12/13/17 0913  BP: 108/74  Pulse: 94  Resp: 18  Temp: (!) 96.9 F (36.1 C)  TempSrc: Tympanic  SpO2: 94%  Weight: 183 lb (83 kg)   Physical Exam  Constitutional: She is oriented to person, place, and time. She appears well-developed and well-nourished.  Unaccompanied  HENT:  Head: Normocephalic and atraumatic.  Nose: Nose normal.  Mouth/Throat: Oropharynx is clear and moist. No oropharyngeal exudate.  Eyes: Pupils are equal, round, and reactive to light. Conjunctivae and EOM are normal. No scleral icterus.  Neck: Normal range of motion. Neck supple.  Cardiovascular: Normal rate, regular rhythm and normal heart sounds.  Pulmonary/Chest: Effort normal and breath sounds normal.  Abdominal: Soft. Bowel sounds are normal. She exhibits no distension. There is no tenderness.  Musculoskeletal: Normal range of motion. She exhibits tenderness (tender to palpation diffusely). She exhibits no edema or deformity.       Right upper arm: She exhibits no swelling, no edema and no deformity.       Left upper arm: She exhibits no swelling, no edema and no deformity.       Right upper leg: She exhibits no swelling and no edema.       Left upper leg: She exhibits no swelling and no edema.       Right lower leg: She exhibits no swelling and no edema.       Left lower leg: She exhibits  no swelling and no edema.       Right foot: There is no swelling.       Left foot: There is no swelling.  Lymphadenopathy:    She has no cervical adenopathy.  Neurological: She is alert and oriented to person,  place, and time. Coordination normal.  Skin: Skin is warm and dry.  Psychiatric: She has a normal mood and affect.     CMP Latest Ref Rng & Units 11/23/2017  Glucose 70 - 99 mg/dL 226(H)  BUN 6 - 20 mg/dL 11  Creatinine 0.44 - 1.00 mg/dL 0.53  Sodium 135 - 145 mmol/L 138  Potassium 3.5 - 5.1 mmol/L 3.8  Chloride 98 - 111 mmol/L 102  CO2 22 - 32 mmol/L 25  Calcium 8.9 - 10.3 mg/dL 9.6  Total Protein 6.5 - 8.1 g/dL 6.3(L)  Total Bilirubin 0.3 - 1.2 mg/dL 0.9  Alkaline Phos 38 - 126 U/L 78  AST 15 - 41 U/L 20  ALT 0 - 44 U/L 27   CBC Latest Ref Rng & Units 11/23/2017  WBC 4.0 - 10.5 K/uL 9.1  Hemoglobin 12.0 - 15.0 g/dL 13.0  Hematocrit 36.0 - 46.0 % 39.2  Platelets 150 - 400 K/uL 257    No images are attached to the encounter.  Dg Chest 2 View  Result Date: 12/14/2017 CLINICAL DATA:  Productive cough. EXAM: CHEST - 2 VIEW COMPARISON:  Radiographs of November 15, 2016. FINDINGS: The heart size and mediastinal contours are within normal limits. Both lungs are clear. No pneumothorax or pleural effusion is noted. Right subclavian Port-A-Cath is noted with distal tip in expected position of the cavoatrial junction. The visualized skeletal structures are unremarkable. IMPRESSION: Interval placement of right subclavian Port-A-Cath with distal tip in expected position of cavoatrial junction. No pneumothorax is noted. Electronically Signed   By: Marijo Conception, M.D.   On: 12/14/2017 15:21   US Breast Complete Uni Left Inc Axilla  Result Date: 12/14/2017 Ultrasound examination of the left breast and axilla are undertaken to determine if preoperative wire localization would be required.  In the left axilla, anterior aspect at the level of the manubrium a 0.93 x 1.4 x 1.92 cm enlarged lymph node is identified. At the time of her November 07, 2017 exam this had measured 1.3 x 1.67 x 1.73 cm.  Prior to treatment the area measured 2.2 cm. Examination of the lower outer quadrant of the breast showed a  far less well identified mass, potentially at the 2 cm mark from the areola and perhaps now measuring up to 1.19 cm in maximum diameter.  This is far less distinct on past exams.  Wire localization will be of benefit at the time of wide excision.  BI-RADS-6.   Assessment and plan- Patient is a 52 y.o. female diagnosed with breast cancer who presents to symptom management clinic for myalgias.  1.  Stage IIIa ER/PR/HER-2 positive left breast cancer- s/p cycle 10 of neoadjuvant Taxol; discontinued due to peripheral neuropathy.  She is scheduled to follow-up with Dr. Bary Castilla to discuss surgical management tomorrow, 12/14/17.   2.  Myalgias-etiology unclear- autoimmune? Fibromyalgia? Medication/statin induced? Vitamin d def? Metabolic? Somatic? Thyroid? Others. Vitb12, folate, TSH,cbc, cmp- overall normal. FSH/LH/Estradiol consistent with menopause. Vitamin D decreased may be contributory (see below). Start Tramadol, short course for symptoms in interim.   3.  Chemotherapy-induced peripheral neuropathy-patient experiencing significant symptoms with loss of grip and impaired fine motor skills.  Questioning of above myalgias related/contributory.  Gabapentin  dose increased today.  Will refer to neurology for consideration of nerve conduction studies and further evaluation and management.  We will also refer to acupuncturist for treatment on [place]..  4.  Vitamin D deficiency-vitamin D 20.0 (decreased).  Discussed with Dr. Mike Gip who recommends referring patient back to primary care provider for further evaluation and management.  Patient advised to notify the clinic if there is no improvement in symptoms or if symptoms worsen in next 3-4 days. Follow up with Dr. Mike Gip following surgery for breast cancer.     Visit Diagnosis 1. Malignant neoplasm of lower-outer quadrant of left breast of female, estrogen receptor positive ([place])   2. Chemotherapy-induced neuropathy ([place])   3. Chronic fatigue   4.  Arthralgia, unspecified joint     Patient expressed understanding and was in agreement with this plan. She also understands that She can call clinic at any time with any questions, concerns, or complaints.   Thank you for allowing me to participate in the care of this very pleasant patient.   Beckey Rutter, DNP, AGNP-C [place] at [facility] [phone] (work cell) [phone] (office)

## 2017-12-14 ENCOUNTER — Other Ambulatory Visit: Payer: Self-pay | Admitting: General Surgery

## 2017-12-14 ENCOUNTER — Ambulatory Visit: Payer: Managed Care, Other (non HMO) | Admitting: General Surgery

## 2017-12-14 ENCOUNTER — Ambulatory Visit: Payer: Self-pay

## 2017-12-14 ENCOUNTER — Ambulatory Visit
Admission: RE | Admit: 2017-12-14 | Discharge: 2017-12-14 | Disposition: A | Discharge status: Home / Self Care | Payer: Managed Care, Other (non HMO) | Source: Ambulatory Visit | Attending: General Surgery | Admitting: General Surgery

## 2017-12-14 ENCOUNTER — Encounter: Payer: Self-pay | Admitting: General Surgery

## 2017-12-14 ENCOUNTER — Other Ambulatory Visit: Payer: Self-pay

## 2017-12-14 VITALS — BP 118/83 | HR 83 | Temp 97.5°F | Resp 13 | Ht 66.0 in | Wt 184.0 lb

## 2017-12-14 DIAGNOSIS — R0989 Other specified symptoms and signs involving the circulatory and respiratory systems: Secondary | ICD-10-CM | POA: Diagnosis not present

## 2017-12-14 DIAGNOSIS — Z170 Estrogen receptor positive status [ER+]: Principal | ICD-10-CM

## 2017-12-14 DIAGNOSIS — Z95828 Presence of other vascular implants and grafts: Secondary | ICD-10-CM | POA: Insufficient documentation

## 2017-12-14 DIAGNOSIS — C50512 Malignant neoplasm of lower-outer quadrant of left female breast: Secondary | ICD-10-CM

## 2017-12-14 LAB — ESTRADIOL: Estradiol: 12.1 pg/mL

## 2017-12-14 LAB — VITAMIN D 25 HYDROXY (VIT D DEFICIENCY, FRACTURES): Vit D, 25-Hydroxy: 20 ng/mL — ABNORMAL LOW (ref 30.0–100.0)

## 2017-12-14 LAB — FSH/LH
FSH: 24.8 m[IU]/mL
LH: 11.9 m[IU]/mL

## 2017-12-14 IMAGING — CR DG CHEST 2V
1 series · 2 of 2 positions shown · non-contrast
Comparison: Radiographs [DATE].

CLINICAL DATA: Productive cough.

EXAM:
CHEST - 2 VIEW

[Series 1: dg chest 2 view · 0.14mm/px · 2 of 2 slices shown]
[im 1/2]
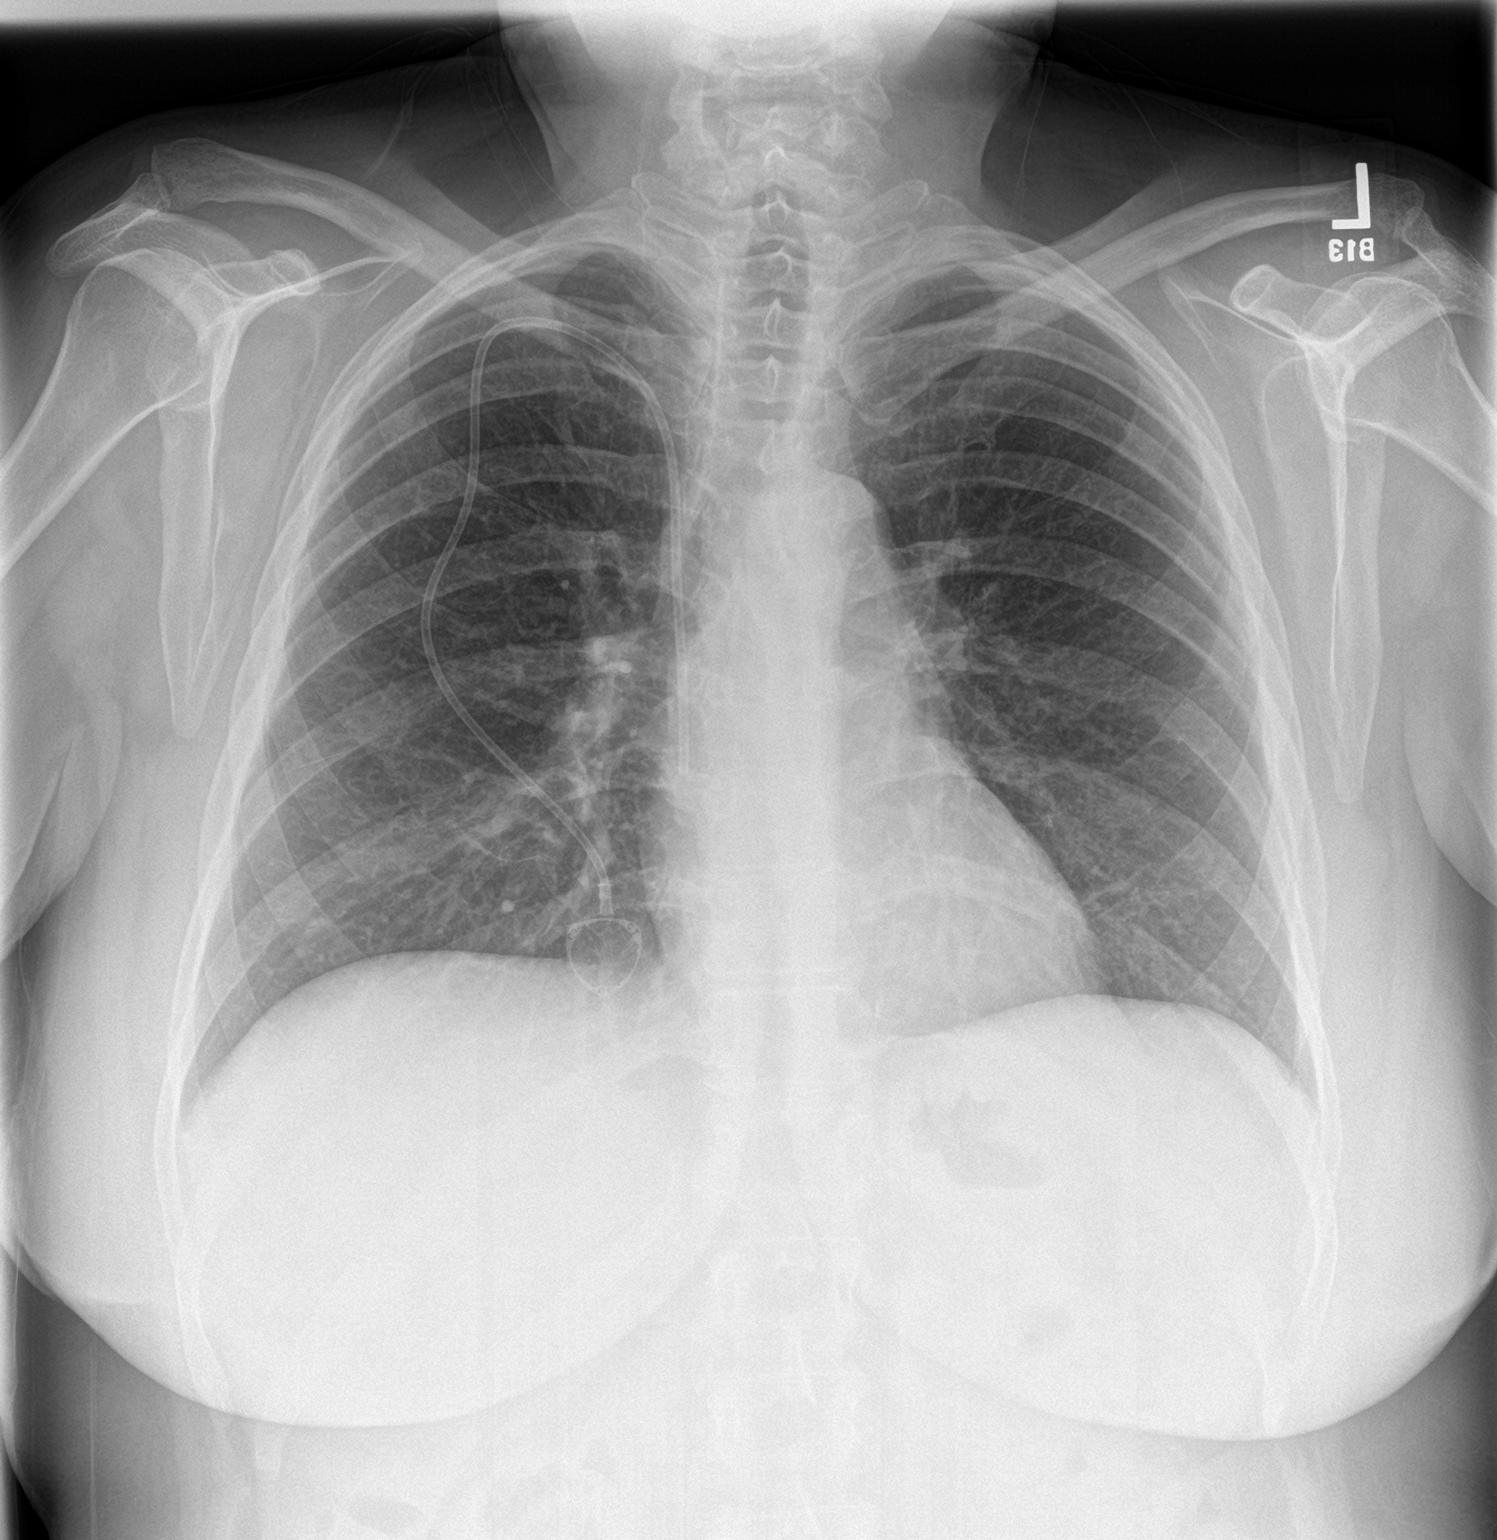
[im 2/2]
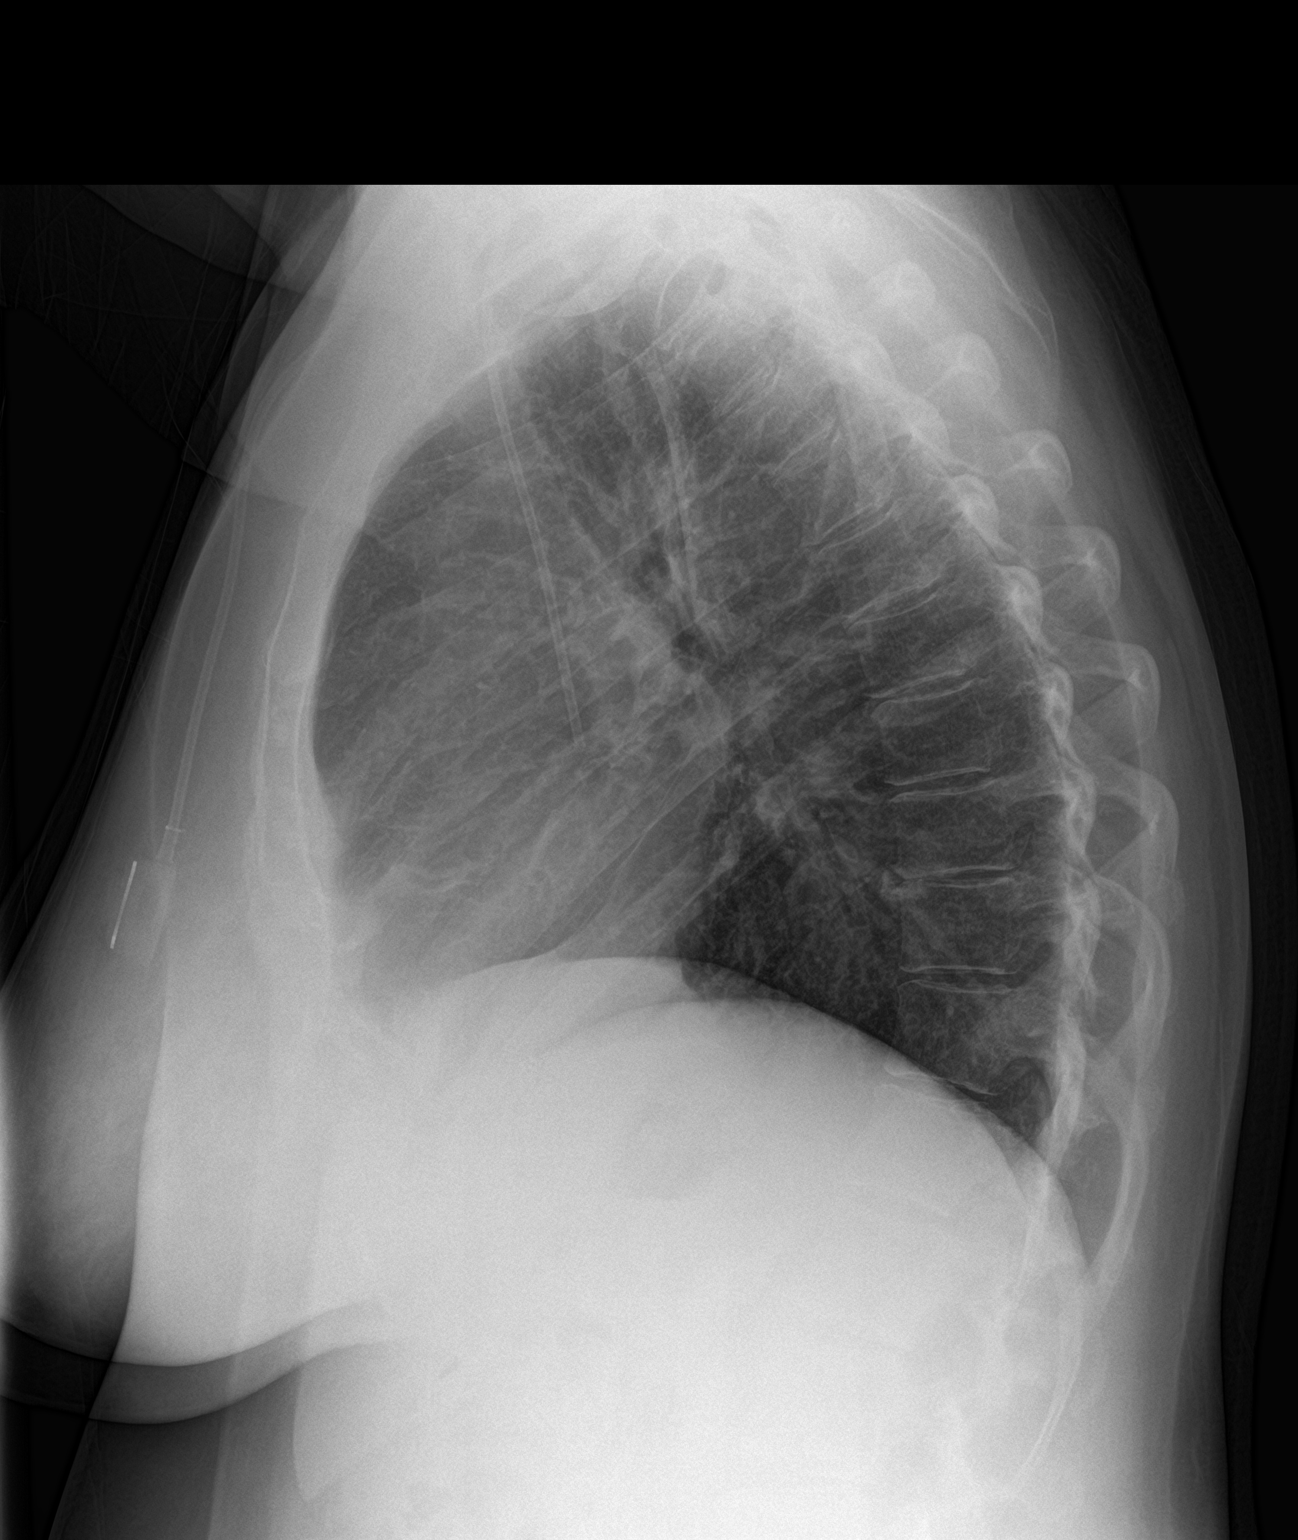

[2 of 2 positions shown; findings below may reference images not displayed]

FINDINGS: The heart size and mediastinal contours are within normal limits.
Both lungs are clear. No pneumothorax or pleural effusion is noted.
Right subclavian Port-A-Cath is noted with distal tip in expected
position of the cavoatrial junction. The visualized skeletal
structures are unremarkable.
IMPRESSION: Interval placement of right subclavian Port-A-Cath with distal tip
in expected position of cavoatrial junction. No pneumothorax is
noted.

## 2017-12-14 NOTE — Progress Notes (Signed)
Patient ID: SAHMYA ARAI, female   DOB: [birth year], 52 y.o.   MRN: [id number]  Chief Complaint  Patient presents with  . Follow-up     f/u to discuss surgery, last date of chemotherapy 11/16/17    HPI Carolyn Roy is a 52 y.o. female here today to discuss surgery, last date of chemotherapy 11/16/17.  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  The patient reports that she has had a cough productive of green sputum since initiation of chemotherapy.  This is not been associate with fever or chills.   Patient states she is doing well she is here today with her fiance Nathaneil Canary.  HPI  Past Medical History:  Diagnosis Date  . Cancer (HCC)    BREAST  . Depression   . Diabetes mellitus without complication ([employer]) 5916  . Endometriosis   . Family history of breast cancer   . Hyperlipidemia   . Hypertension   . Ovarian mass   . Pneumonia     Past Surgical History:  Procedure Laterality Date  . AXILLARY LYMPH NODE BIOPSY Left 07/14/2017   Procedure: INSERTION GEL MARK CLIP LEFT AXILLA;  Surgeon: Robert Bellow, MD;  Location: [facility] ORS;  Service: General;  Laterality: Left;  . OOPHORECTOMY    . PORTACATH PLACEMENT Right 07/14/2017   Procedure: INSERTION PORT-A-CATH;  Surgeon: Robert Bellow, MD;  Location: [facility] ORS;  Service: General;  Laterality: Right;  . TUBAL LIGATION      Family History  Problem Relation Age of Onset  . Other Father        No info about father or paternal relatives  . Diabetes Brother   . Pancreatitis Brother   . Prostate cancer Brother 85       currently 43 / maternal half-brother  . Breast cancer Maternal Grandmother 40       deceased 75s  . Breast cancer Maternal Aunt 65       currently 24  . Breast cancer Other 52       mother's sister; deceased 65  . Breast cancer Other        mother's sister; age at dx unknown    Social History Social History   Tobacco Use  . Smoking status: Current Every Day  Smoker    Packs/day: 0.50    Years: 11.00    Pack years: 5.50    Types: Cigarettes  . Smokeless tobacco: Former Systems developer    Types: Snuff  Substance Use Topics  . Alcohol use: No    Alcohol/week: 0.0 standard drinks  . Drug use: No    Allergies  Allergen Reactions  . Aspirin Nausea And Vomiting    Current Outpatient Medications  Medication Sig Dispense Refill  . albuterol (PROVENTIL HFA;VENTOLIN HFA) 108 (90 Base) MCG/ACT inhaler Inhale 2 puffs into the lungs every 6 (six) hours as needed for wheezing or shortness of breath. 1 Inhaler 0  . atorvastatin (LIPITOR) 40 MG tablet Take 1 tablet (40 mg total) by mouth daily. 90 tablet 3  . gabapentin (NEURONTIN) 300 MG capsule Take 1 Capsule (339m) by mouth in the morning, 1 capsule (3019m mid-day, and 3 capsules (900 mg) at bedtime for nerve pain. 150 capsule 0  . glucose blood (ONE TOUCH ULTRA TEST) test strip Use up to 4 times/day 100 each 12  . Insulin Glargine (BASAGLAR KWIKPEN) 100 UNIT/ML SOPN INJECT 0.24 MLS (24 UNITS TOTAL) INTO THE SKIN AT BEDTIME. 15 pen 2  . Insulin  Pen Needle 32G X 4 MM MISC 1 Units by Does not apply route every morning. Pen needles 90 each 3  . INVOKANA 100 MG TABS tablet TAKE 1 TABLET (100 MG TOTAL) BY MOUTH DAILY BEFORE BREAKFAST. (Patient taking differently: 300 mg. ) 30 tablet 0  . lidocaine-prilocaine (EMLA) cream Apply to affected area once 30 g 3  . metFORMIN (GLUCOPHAGE) 500 MG tablet TAKE 2 TABLETS (1,000 MG TOTAL) BY MOUTH 2 (TWO) TIMES DAILY WITH A MEAL. 120 tablet 1  . ondansetron (ZOFRAN) 8 MG tablet Take 1 tablet (8 mg total) by mouth 2 (two) times daily as needed. Start on the third day after chemotherapy. 30 tablet 1  . prochlorperazine (COMPAZINE) 10 MG tablet Take 1 tablet (10 mg total) by mouth every 6 (six) hours as needed (Nausea or vomiting). 30 tablet 1  . traMADol (ULTRAM) 50 MG tablet Take 1 tablet (50 mg total) by mouth every 6 (six) hours as needed for severe pain. 30 tablet 0   No  current facility-administered medications for this visit.     Review of Systems Review of Systems  Constitutional: Negative.   Respiratory: Negative.   Cardiovascular: Negative.     Blood pressure 118/83, pulse 83, temperature (!) 97.5 F (36.4 C), temperature source Temporal, resp. rate 13, height _0  (1.676 m), weight 184 lb (83.5 kg), SpO2 92 %.  Physical Exam Physical Exam  Constitutional: She is oriented to person, place, and time. She appears well-developed and well-nourished.  Eyes: Conjunctivae are normal. No scleral icterus.  Neck: Normal range of motion.  Cardiovascular: Normal rate, regular rhythm and normal heart sounds.  Pulmonary/Chest: Effort normal and breath sounds normal. Right breast exhibits no inverted nipple, no mass, no nipple discharge, no skin change and no tenderness. Left breast exhibits no inverted nipple, no mass, no nipple discharge, no skin change and no tenderness. Breasts are symmetrical.    Lymphadenopathy:    She has no cervical adenopathy.    She has axillary adenopathy (left axillary node remains palpable. ).       Left: No supraclavicular adenopathy present.  Neurological: She is alert and oriented to person, place, and time.  Skin: Skin is warm and dry.   Measurement of the upper extremities were obtained to the location 15 cm above as well as 10 and 20 cm below the olecranon process.  Right: 34, 26.5, 21 cm.  Left: 34, 27, 20.5 cm.   Data Reviewed Ultrasound examination of the left breast and axilla are undertaken to determine if preoperative wire localization would be required.  In the left axilla, anterior aspect at the level of the manubrium a 0.93 x 1.4 x 1.92 cm enlarged lymph node is identified. At the time of her November 07, 2017 exam this had measured 1.3 x 1.67 x 1.73 cm.  Prior to treatment the area measured 2.2 cm.  Examination of the lower outer quadrant of the breast showed a far less well identified mass, potentially at the  2 cm mark from the areola and perhaps now measuring up to 1.19 cm in maximum diameter.  This is far less distinct on past exams.  Wire localization will be of benefit at the time of wide excision.  BI-RADS-6.  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  Status post neoadjuvant chemotherapy.    Plan  We spent the majority of the 40-minute visit reviewing options for management.  The patient is interested in breast conservation, and  I think this is very reasonable considering the significant improvement in the breast mass post neoadjuvant chemotherapy.  The potential for positive margins and need for additional surgery were reviewed, also role of adjuvant radiation therapy.  The opportunity for plastic surgery consultation should the patient desire mastectomy was discussed, declined at this time.  Plans for sentinel node injection were discussed as well as the use of EMLA cream.  Will obtain a frozen section on the lymph node and if positive for tumor would proceed to axillary dissection.  Risks of lymphedema reviewed.  HPI, Physical Exam, Assessment and Plan have been scribed under the direction and in the presence of Hervey Ard, Md.  Eudelia Bunch R. Bobette Mo, CMA [name] Taneesha Edgin 12/14/2017, 9:22 PM  Patient to have a PA and lateral CXR at [place] done today.   Patient's surgery to be scheduled for 01-12-18 at [facility] with Dr. Bary Castilla. This is per patient's request to wait until after the Thanksgiving Holiday.   The patient is aware to call the office should they have further questions.   Dominga Ferry, CMA

## 2017-12-15 ENCOUNTER — Telehealth: Payer: Self-pay | Admitting: *Deleted

## 2017-12-15 NOTE — Telephone Encounter (Signed)
-----   Message from Robert Bellow, MD sent at 12/15/2017  8:34 AM EST ----- Please notify the patient the CXR was OK. Thanks.  ----- Message ----- From: Interface, Rad Results In Sent: 12/14/2017   3:23 PM EST To: Robert Bellow, MD

## 2017-12-15 NOTE — Telephone Encounter (Signed)
Left message for patient to call the office back for results of CXR

## 2017-12-18 ENCOUNTER — Telehealth: Payer: Self-pay | Admitting: *Deleted

## 2017-12-18 NOTE — Telephone Encounter (Signed)
Patient was contacted today to confirm surgery has been scheduled for 01-12-18 at [facility] with Dr. Bary Castilla. She has been notified to check-in at the [facility] at 7:45 am day of surgery.   The patient is aware the Pre-admission Testing Department will be calling the afternoon of 12-29-17.  The patient is aware to call the office should they have further questions.

## 2017-12-18 NOTE — Telephone Encounter (Signed)
Referral faxed to [facility] per Beckey Rutter, NP's request.

## 2017-12-29 ENCOUNTER — Other Ambulatory Visit: Payer: Self-pay

## 2017-12-29 ENCOUNTER — Encounter
Admission: RE | Admit: 2017-12-29 | Discharge: 2017-12-29 | Disposition: A | Discharge status: Home / Self Care | Payer: Self-pay | Source: Ambulatory Visit | Attending: General Surgery | Admitting: General Surgery

## 2017-12-29 HISTORY — DX: Headache, unspecified: R51.9

## 2017-12-29 HISTORY — DX: Headache: R51

## 2017-12-29 NOTE — Patient Instructions (Signed)
Your procedure is scheduled on: 01-12-18 FRIDAY Report to [facility] @ [phone]  Remember: Instructions that are not followed completely may result in serious medical risk, up to and including death, or upon the discretion of your surgeon and anesthesiologist your surgery may need to be rescheduled.    _x___ 1. Do not eat food after midnight the night before your procedure. You may drink clear liquids up to 2 hours before you are scheduled to arrive at the hospital for your procedure.  Do not drink clear liquids within 2 hours of your scheduled arrival to the hospital.  Type 1 and type 2 diabetics should only drink water.   ____Ensure clear carbohydrate drink on the way to the hospital for bariatric patients  ____Ensure clear carbohydrate drink 3 hours before surgery for Dr Dwyane Luo patients if physician instructed.   No gum chewing or hard candies.     __x__ 2. No Alcohol for 24 hours before or after surgery.   __x__3. No Smoking or e-cigarettes for 24 prior to surgery.  Do not use any chewable tobacco products for at least 6 hour prior to surgery   ____  4. Bring all medications with you on the day of surgery if instructed.    __x__ 5. Notify your doctor if there is any change in your medical condition     (cold, fever, infections).    x___6. On the morning of surgery brush your teeth with toothpaste and water.  You may rinse your mouth with mouth wash if you wish.  Do not swallow any toothpaste or mouthwash.   Do not wear jewelry, make-up, hairpins, clips or nail polish.  Do not wear lotions, powders, or perfumes. You may wear deodorant.  Do not shave 48 hours prior to surgery. Men may shave face and neck.  Do not bring valuables to the hospital.    [facility] is not responsible for any belongings or valuables.               Contacts, dentures or bridgework may not be worn into surgery.  Leave your suitcase in the car. After surgery it may be brought to your room.  For  patients admitted to the hospital, discharge time is determined by your treatment team.  _  Patients discharged the day of surgery will not be allowed to drive home.  You will need someone to drive you home and stay with you the night of your procedure.    Please read over the following fact sheets that you were given:   [facility] Preparing for Surgery   _x___ TAKE THE FOLLOWING MEDICATION THE MORNING OF SURGERY WITH A SMALL SIP OF WATER. These include:  1. GABAPENTIN  2. LIPITOR  3.  4.  5.  6.  ____Fleets enema or Magnesium Citrate as directed.   ____ Use CHG Soap or sage wipes as directed on instruction sheet   _X___ Use inhalers on the day of surgery and bring to hospital day of surgery-BRING YOUR ALBUTEROL [place]  _X___ Stop Metformin days prior to surgery-LAST DOSE ON Tuesday, December 3RD   ____ Take 1/2 of usual insulin dose the night before surgery and none on the morning surgery.   ____ Follow recommendations from Cardiologist, Pulmonologist or PCP regarding stopping Aspirin, Coumadin, Plavix ,Eliquis, Effient, or Pradaxa, and Pletal.  X____Stop Anti-inflammatories such as Advil, Aleve, Ibuprofen, Motrin, Naproxen, Naprosyn, Goodies powders or aspirin products. OK to take Tylenol-STOP EXCEDRIN MIGRAINE 7 DAYS PRIOR TO SURGERY  ____ Stop supplements until after surgery.     ____ Bring C-Pap to the hospital.

## 2018-01-03 ENCOUNTER — Other Ambulatory Visit: Payer: Self-pay | Admitting: Nurse Practitioner

## 2018-01-09 DIAGNOSIS — R202 Paresthesia of skin: Secondary | ICD-10-CM

## 2018-01-09 DIAGNOSIS — R208 Other disturbances of skin sensation: Secondary | ICD-10-CM | POA: Insufficient documentation

## 2018-01-09 DIAGNOSIS — R200 Anesthesia of skin: Secondary | ICD-10-CM | POA: Insufficient documentation

## 2018-01-11 NOTE — Pre-Procedure Instructions (Signed)
Carolyn Roy  ECHO COMPLETE WO IMAGING ENHANCING AGENT  Order# [id number]  Reading physician: Minna Merritts, MD Ordering physician: Karen Kitchens, NP Study date: 06/28/17  Study Result   Result status: Final result                   *[place], [place] [address]                            [phone]  ------------------------------------------------------------------- Transthoracic Echocardiography  Patient:    Carolyn, Roy MR #:       [id number] Study Date: 06/28/2017 Gender:     F Age:        52 Height:     165.1 cm Weight:     89 kg BSA:        2.05 m^2 Pt. Status: Room:   REFERRING    Kathrine Haddock  SONOGRAPHER  [facility]  PERFORMING   [facility]  ATTENDING    Winifred Olive E  REFERRING    Honor Loh E  cc:  ------------------------------------------------------------------- LV EF: 60% -   65%  ------------------------------------------------------------------- Indications:      Cancer of midline of breast- left, pre-operative cardiovascular examination.  ------------------------------------------------------------------- History:   PMH:  Hyperlipidemia, pneumonia.  Risk factors: Diabetes mellitus.  ------------------------------------------------------------------- Study Conclusions  - Left ventricle: The cavity size was normal. Systolic function was   normal. The estimated ejection fraction was in the range of 60%   to 65%. Wall motion was normal; there were no regional wall   motion abnormalities. Doppler parameters are consistent with   abnormal left ventricular relaxation (grade 1 diastolic   dysfunction). - Left atrium: The atrium was normal in size. - Right ventricle: Systolic function was normal. - Pulmonary arteries: Systolic pressure was within the normal    range.  ------------------------------------------------------------------- Study data:   Study status:  Routine.  Procedure:  The patient reported no pain pre or post test. Transthoracic echocardiography. Image quality was adequate.          Transthoracic echocardiography.  M-mode, complete 2D, spectral Doppler, and color Doppler.  Birthdate:  Patient birthdate: 1965/02/12.  Age:  Patient is 52 yr old.  Sex:  Gender: female.    BMI: 32.6 kg/m^2.  Blood pressure:     122/80  Patient status:  Outpatient.  Study date: Study date: 06/28/2017. Study time: 09:45 AM.  Location:  Echo laboratory.  -------------------------------------------------------------------  ------------------------------------------------------------------- Left ventricle:  The cavity size was normal. Systolic function was normal. The estimated ejection fraction was in the range of 60% to 65%. Wall motion was normal; there were no regional wall motion abnormalities. Doppler parameters are consistent with abnormal left ventricular relaxation (grade 1 diastolic dysfunction).  ------------------------------------------------------------------- Aortic valve:  Poorly visualized.  Trileaflet; normal thickness leaflets. Mobility was not restricted.  Doppler:  Transvalvular velocity was within the normal range. There was no stenosis. There was no regurgitation.    Peak velocity ratio of LVOT to aortic valve: 0.75. Valve area (Vmax): 3.11 cm^2. Indexed valve area (Vmax): 1.52 cm^2/m^2.    Peak gradient (  S): 4 mm Hg.  ------------------------------------------------------------------- Aorta:  Aortic root: The aortic root was normal in size.  ------------------------------------------------------------------- Mitral valve:   Structurally normal valve.   Mobility was not restricted.  Doppler:  Transvalvular velocity was within the normal range. There was no evidence for stenosis. There was no regurgitation.    Valve  area by pressure half-time: 3.73 cm^2. Indexed valve area by pressure half-time: 1.82 cm^2/m^2.  ------------------------------------------------------------------- Left atrium:  The atrium was normal in size.  ------------------------------------------------------------------- Right ventricle:  The cavity size was normal. Wall thickness was normal. Systolic function was normal.  ------------------------------------------------------------------- Pulmonic valve:    Doppler:  Transvalvular velocity was within the normal range. There was no evidence for stenosis.  ------------------------------------------------------------------- Tricuspid valve:   Structurally normal valve.    Doppler: Transvalvular velocity was within the normal range. There was no regurgitation.  ------------------------------------------------------------------- Pulmonary artery:   The main pulmonary artery was normal-sized. Systolic pressure was within the normal range.  ------------------------------------------------------------------- Right atrium:  The atrium was normal in size.  ------------------------------------------------------------------- Pericardium:  There was no pericardial effusion.  ------------------------------------------------------------------- Systemic veins: Inferior vena cava: The vessel was normal in size.  ------------------------------------------------------------------- Measurements   Left ventricle                           Value          Reference  LV ID, ED, PLAX chordal                  43.3  mm       43 - 52  LV ID, ES, PLAX chordal                  26.6  mm       23 - 38  LV fx shortening, PLAX chordal           39    %        >=29  LV PW thickness, ED                      9.66  mm       ----------  IVS/LV PW ratio, ED                      0.95           <=1.3  LV e&', lateral                           10.1  cm/s     ----------  LV E/e&', lateral                          5.24           ----------  LV e&', medial                            6.64  cm/s     ----------  LV E/e&', medial                          7.97           ----------  LV e&', average  8.37  cm/s     ----------  LV E/e&', average                         6.32           ----------    Ventricular septum                       Value          Reference  IVS thickness, ED                        9.16  mm       ----------    LVOT                                     Value          Reference  LVOT ID, S                               23    mm       ----------  LVOT area                                4.15  cm^2     ----------  LVOT peak velocity, S                    70.3  cm/s     ----------    Aortic valve                             Value          Reference  Aortic valve peak velocity, S            93.7  cm/s     ----------  Aortic peak gradient, S                  4     mm Hg    ----------  Velocity ratio, peak, LVOT/AV            0.75           ----------  Aortic valve area, peak velocity         3.11  cm^2     ----------  Aortic valve area/bsa, peak              1.52  cm^2/m^2 ----------  velocity    Aorta                                    Value          Reference  Aortic root ID, ED                       29    mm       ----------    Left atrium                              Value  Reference  LA ID, A-P, ES                           38    mm       ----------  LA ID/bsa, A-P                           1.85  cm/m^2   <=2.2  LA volume, S                             33.6  ml       ----------  LA volume/bsa, S                         16.4  ml/m^2   ----------  LA volume, ES, 1-p A4C                   35.3  ml       ----------  LA volume/bsa, ES, 1-p A4C               17.2  ml/m^2   ----------  LA volume, ES, 1-p A2C                   31.8  ml       ----------  LA volume/bsa, ES, 1-p A2C               15.5  ml/m^2   ----------    Mitral valve                              Value          Reference  Mitral E-wave peak velocity              52.9  cm/s     ----------  Mitral A-wave peak velocity              70.3  cm/s     ----------  Mitral deceleration time                 201   ms       150 - 230  Mitral pressure half-time                59    ms       ----------  Mitral E/A ratio, peak                   0.8            ----------  Mitral valve area, PHT, DP               3.73  cm^2     ----------  Mitral valve area/bsa, PHT, DP           1.82  cm^2/m^2 ----------    Right atrium                             Value          Reference  RA ID, S-I, ES, A4C              (H)     51.8  mm       34 -  49  RA area, ES, A4C                         11.4  cm^2     8.3 - 19.5  RA volume, ES, A/L                       20.8  ml       ----------  RA volume/bsa, ES, A/L                   10.1  ml/m^2   ----------    Right ventricle                          Value          Reference  RV ID, ED, PLAX                          30    mm       19 - 38  TAPSE                                    30    mm       ----------  RV s&', lateral, S                        9.79  cm/s     ----------    Pulmonic valve                           Value          Reference  Pulmonic valve peak velocity, S          57.7  cm/s     ----------  Legend: (L)  and  (H)  mark values outside specified reference range.  ------------------------------------------------------------------- Prepared and Electronically Authenticated by  Esmond Plants, MD, [facility] 2019-05-22T10:54:20  [facility] Images   Show images for ECHOCARDIOGRAM COMPLETE  Patient Information   Patient Name Carolyn, Roy Sex Female DOB 21-Jul-1965 SSN [id number]  Reason for Exam  Priority: Routine  Dx: Cancer of midline of breast, left ([place]) [P10.258 (ICD-10-CM)]; Pre-operative cardiovascular examination [Z01.810 (ICD-10-CM)]  Comments:   Surgical History   Surgical History   No past medical history on file.     Other Surgical History   Procedure Laterality Date Comment Source  AXILLARY LYMPH NODE BIOPSY Left 07/14/2017 Procedure: INSERTION GEL MARK CLIP LEFT AXILLA; Surgeon: Robert Bellow, MD; Location: [facility] ORS; Service: General; Laterality: Left; Provider  OOPHORECTOMY    Provider  PORTACATH PLACEMENT Right 07/14/2017 Procedure: INSERTION PORT-A-CATH; Surgeon: Robert Bellow, MD; Location: [facility] ORS; Service: General; Laterality: Right; Provider  TUBAL LIGATION    Provider    Performing Technologist/Nurse   Performing Technologist/Nurse: Gabriel Cirri                    Implants    No active implants to display in this view.  Order-Level Documents:   There are no order-level documents.  Encounter-Level Documents - 06/28/2017:   Electronic signature on 06/28/2017 9:23 AM - Signed      Signed   Electronically signed by Minna Merritts, MD on 06/28/17 at 1054 EDT  Printable  Result Report   Result Report   External Result Report   External Result Report

## 2018-01-12 ENCOUNTER — Encounter
Admission: RE | Disposition: A | Discharge status: Home / Self Care | Payer: Self-pay | Source: Ambulatory Visit | Attending: General Surgery

## 2018-01-12 ENCOUNTER — Encounter: Payer: Self-pay | Admitting: Anesthesiology

## 2018-01-12 ENCOUNTER — Encounter: Payer: Self-pay | Admitting: General Surgery

## 2018-01-12 ENCOUNTER — Ambulatory Visit
Admission: RE | Admit: 2018-01-12 | Discharge: 2018-01-12 | Disposition: A | Discharge status: Home / Self Care | Payer: 59 | Source: Ambulatory Visit | Attending: General Surgery | Admitting: General Surgery

## 2018-01-12 ENCOUNTER — Ambulatory Visit: Payer: 59 | Admitting: Certified Registered Nurse Anesthetist

## 2018-01-12 ENCOUNTER — Ambulatory Visit: Payer: Managed Care, Other (non HMO)

## 2018-01-12 ENCOUNTER — Ambulatory Visit: Payer: Self-pay

## 2018-01-12 ENCOUNTER — Other Ambulatory Visit: Payer: Self-pay

## 2018-01-12 DIAGNOSIS — D242 Benign neoplasm of left breast: Secondary | ICD-10-CM | POA: Diagnosis not present

## 2018-01-12 DIAGNOSIS — Z170 Estrogen receptor positive status [ER+]: Principal | ICD-10-CM

## 2018-01-12 DIAGNOSIS — F17210 Nicotine dependence, cigarettes, uncomplicated: Secondary | ICD-10-CM | POA: Diagnosis not present

## 2018-01-12 DIAGNOSIS — Z853 Personal history of malignant neoplasm of breast: Secondary | ICD-10-CM | POA: Insufficient documentation

## 2018-01-12 DIAGNOSIS — C50512 Malignant neoplasm of lower-outer quadrant of left female breast: Secondary | ICD-10-CM

## 2018-01-12 DIAGNOSIS — Z794 Long term (current) use of insulin: Secondary | ICD-10-CM | POA: Diagnosis not present

## 2018-01-12 DIAGNOSIS — G43909 Migraine, unspecified, not intractable, without status migrainosus: Secondary | ICD-10-CM | POA: Insufficient documentation

## 2018-01-12 DIAGNOSIS — Z79899 Other long term (current) drug therapy: Secondary | ICD-10-CM | POA: Diagnosis not present

## 2018-01-12 DIAGNOSIS — E119 Type 2 diabetes mellitus without complications: Secondary | ICD-10-CM | POA: Insufficient documentation

## 2018-01-12 DIAGNOSIS — Z803 Family history of malignant neoplasm of breast: Secondary | ICD-10-CM | POA: Diagnosis not present

## 2018-01-12 DIAGNOSIS — J449 Chronic obstructive pulmonary disease, unspecified: Secondary | ICD-10-CM | POA: Insufficient documentation

## 2018-01-12 HISTORY — PX: SENTINEL NODE BIOPSY: SHX6608

## 2018-01-12 HISTORY — PX: BREAST LUMPECTOMY: SHX2

## 2018-01-12 HISTORY — PX: PARTIAL MASTECTOMY WITH NEEDLE LOCALIZATION: SHX6008

## 2018-01-12 LAB — GLUCOSE, CAPILLARY
Glucose-Capillary: 171 mg/dL — ABNORMAL HIGH (ref 70–99)
Glucose-Capillary: 189 mg/dL — ABNORMAL HIGH (ref 70–99)

## 2018-01-12 LAB — POCT PREGNANCY, URINE: Preg Test, Ur: NEGATIVE

## 2018-01-12 IMAGING — MG NEEDLE LOCALIZATION OF THE LEFT BREAST WITH MAMMO GUIDANCE
8 of 9 series · 8 of 17 positions shown · non-contrast
Comparison: Previous exams.

CLINICAL DATA: Left breast cancer status post neoadjuvant
chemotherapy. Patient is here for needle localization prior to left
lumpectomy.

EXAM:
NEEDLE LOCALIZATION OF THE LEFT BREAST WITH MAMMO GUIDANCE

[L FB (1 of 4)]
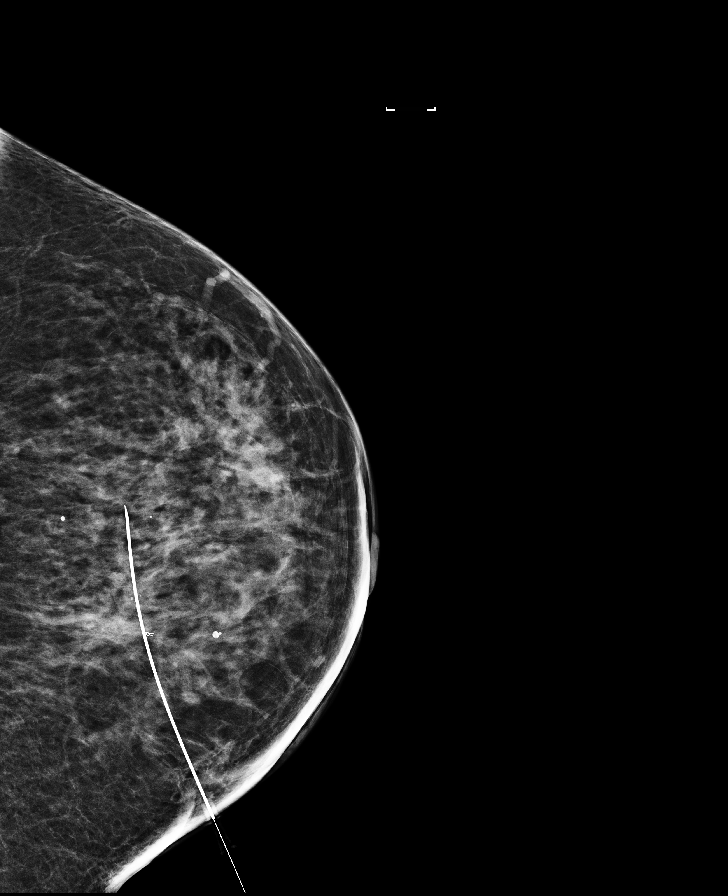

[L FB (2 of 4)]
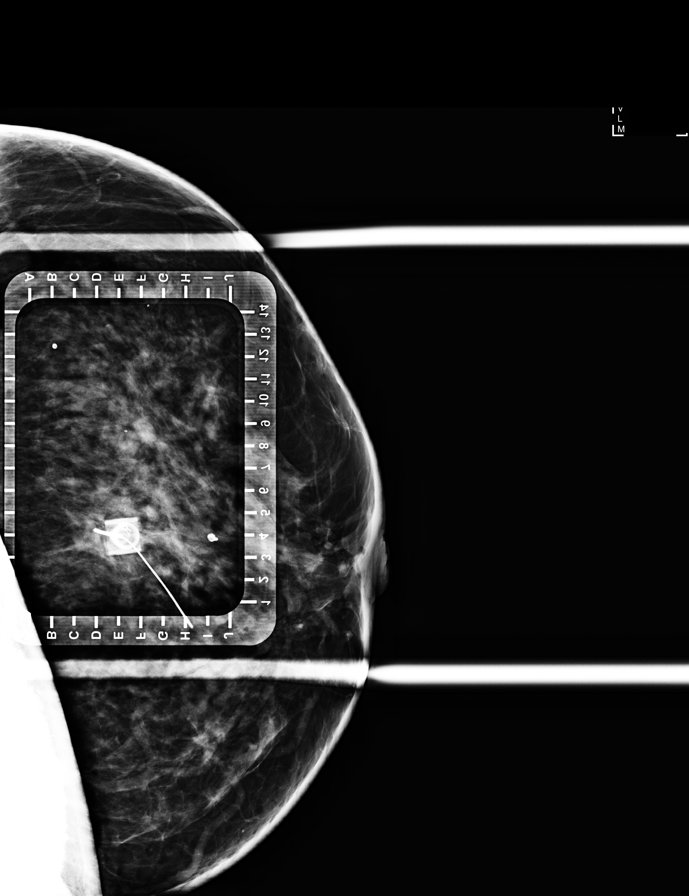

[L FB (3 of 4)]
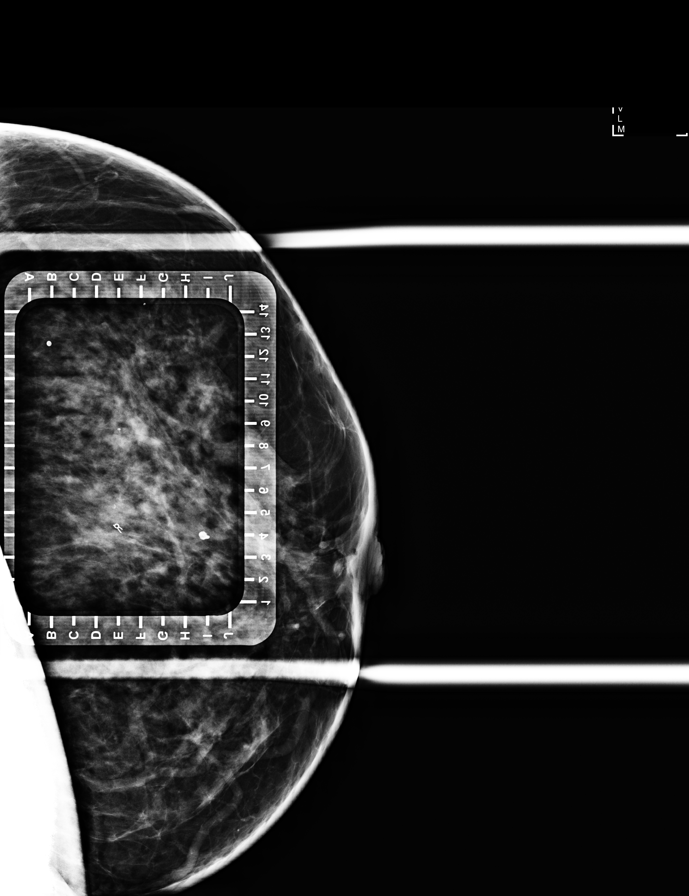

[L LM]
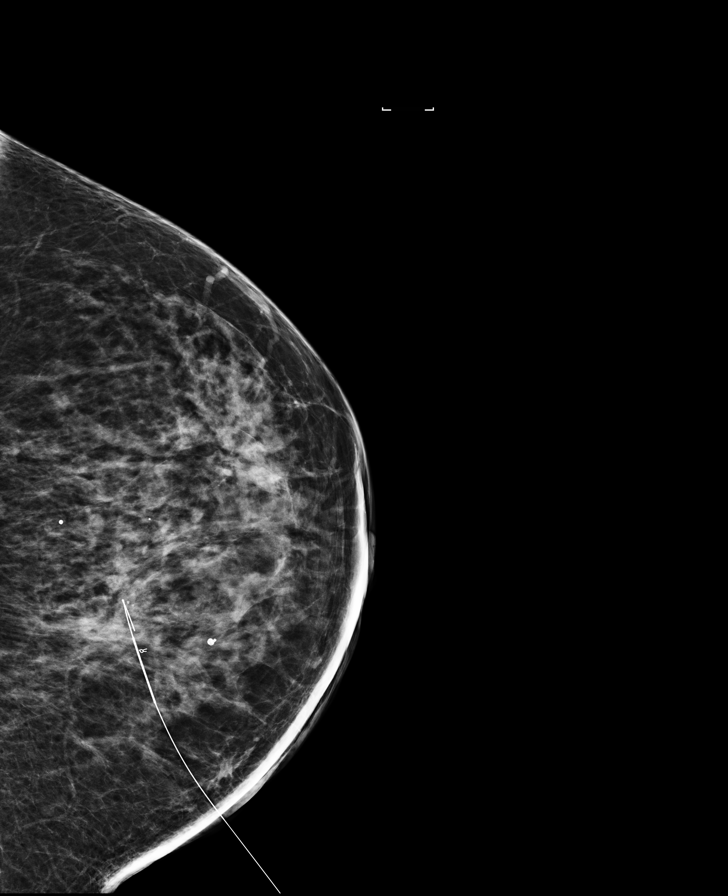

[L FB (4 of 4)]
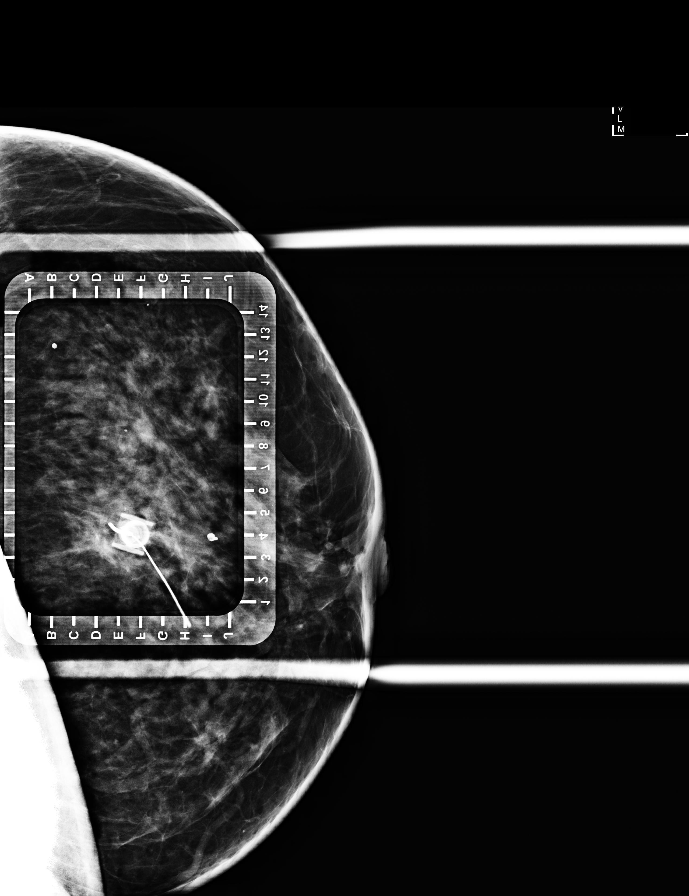

[L CC synth-2D]
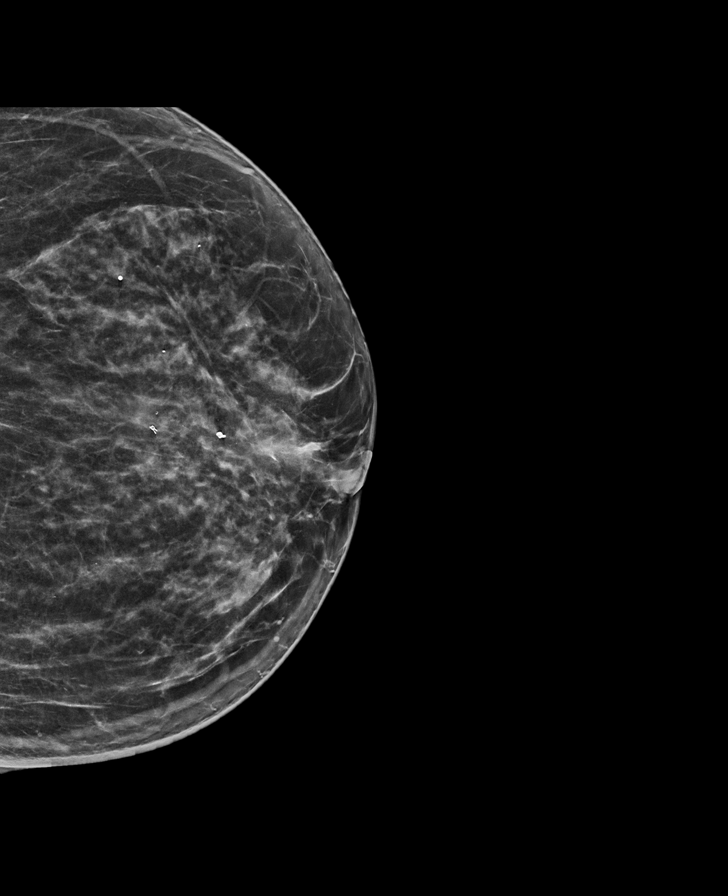

[L ML synth-2D]
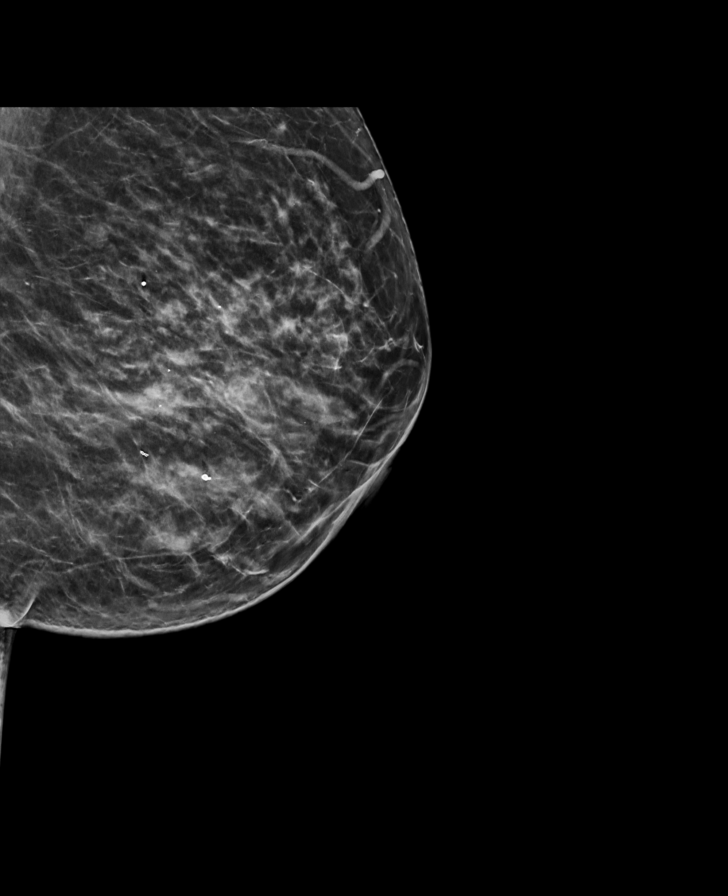

[L ML tomo · tomo slice 38/75.0]
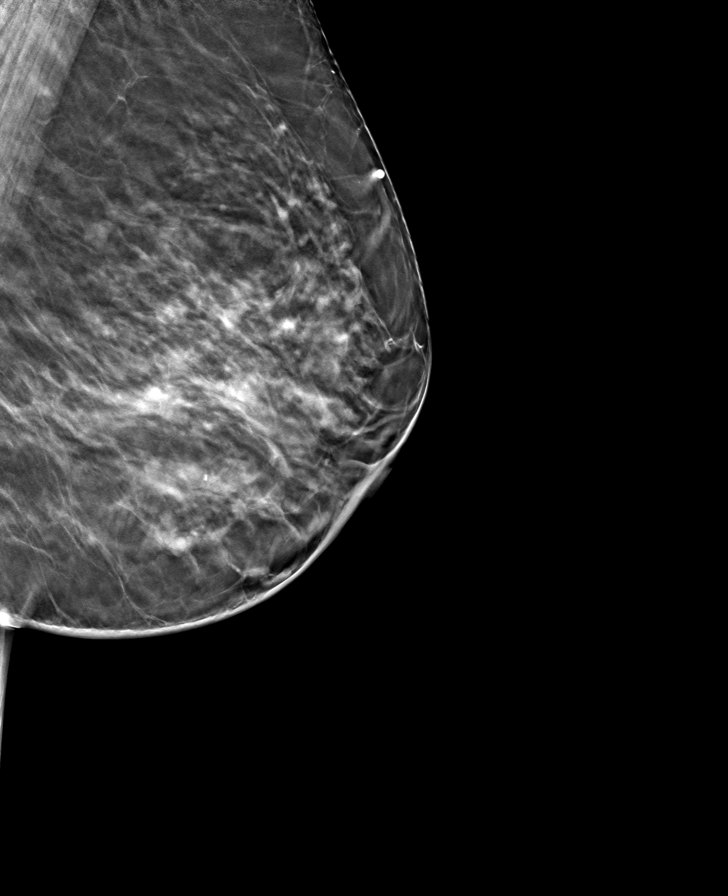

[8 of 17 positions shown; findings below may reference images not displayed]

FINDINGS: Patient presents for needle localization prior to left lumpectomy. I
met with the patient and we discussed the procedure of needle
localization including benefits and alternatives. We discussed the
high likelihood of a successful procedure. We discussed the risks of
the procedure, including infection, bleeding, tissue injury, and
further surgery. Informed, written consent was given. The usual
time-out protocol was performed immediately prior to the procedure.

Using mammographic guidance, sterile technique, 1% lidocaine and a 9
cm modified Kopans needle, biopsy clip in the lower left breast
localized using caudal approach. The images were marked for Dr.
JIM.
IMPRESSION: Needle localization left breast. No apparent complications.

## 2018-01-12 SURGERY — PARTIAL MASTECTOMY WITH NEEDLE LOCALIZATION
Anesthesia: General | Laterality: Left

## 2018-01-12 MED ORDER — LIDOCAINE HCL (PF) 2 % IJ SOLN
INTRAMUSCULAR | Status: AC
  Filled 2018-01-12: qty 10

## 2018-01-12 MED ORDER — FAMOTIDINE 20 MG PO TABS
20.0000 mg | ORAL_TABLET | Freq: Once | ORAL | Status: AC
  Administered 2018-01-12: 20 mg via ORAL

## 2018-01-12 MED ORDER — BUPIVACAINE-EPINEPHRINE (PF) 0.5% -1:200000 IJ SOLN
INTRAMUSCULAR | Status: AC
  Filled 2018-01-12: qty 30

## 2018-01-12 MED ORDER — FAMOTIDINE 20 MG PO TABS
ORAL_TABLET | ORAL | Status: AC
  Filled 2018-01-12: qty 1

## 2018-01-12 MED ORDER — ACETAMINOPHEN 10 MG/ML IV SOLN
INTRAVENOUS | Status: DC | PRN
  Administered 2018-01-12: 1000 mg via INTRAVENOUS

## 2018-01-12 MED ORDER — MIDAZOLAM HCL 2 MG/2ML IJ SOLN
INTRAMUSCULAR | Status: DC | PRN
  Administered 2018-01-12: 2 mg via INTRAVENOUS

## 2018-01-12 MED ORDER — PHENYLEPHRINE HCL 10 MG/ML IJ SOLN
INTRAMUSCULAR | Status: DC | PRN
  Administered 2018-01-12 (×2): 50 ug via INTRAVENOUS

## 2018-01-12 MED ORDER — DEXAMETHASONE SODIUM PHOSPHATE 10 MG/ML IJ SOLN
INTRAMUSCULAR | Status: AC
  Filled 2018-01-12: qty 1

## 2018-01-12 MED ORDER — ONDANSETRON HCL 4 MG/2ML IJ SOLN
INTRAMUSCULAR | Status: AC
  Filled 2018-01-12: qty 2

## 2018-01-12 MED ORDER — HYDROCODONE-ACETAMINOPHEN 5-325 MG PO TABS
ORAL_TABLET | ORAL | Status: AC
  Filled 2018-01-12: qty 1

## 2018-01-12 MED ORDER — HYDROCODONE-ACETAMINOPHEN 5-325 MG PO TABS: 1.0000 | ORAL_TABLET | ORAL | 0 refills | Status: DC | PRN

## 2018-01-12 MED ORDER — FENTANYL CITRATE (PF) 100 MCG/2ML IJ SOLN
INTRAMUSCULAR | Status: AC
  Filled 2018-01-12: qty 2

## 2018-01-12 MED ORDER — DEXAMETHASONE SODIUM PHOSPHATE 10 MG/ML IJ SOLN
INTRAMUSCULAR | Status: DC | PRN
  Administered 2018-01-12: 4 mg via INTRAVENOUS

## 2018-01-12 MED ORDER — LACTATED RINGERS IV SOLN
INTRAVENOUS | Status: DC | PRN
  Administered 2018-01-12: 10:00:00 via INTRAVENOUS

## 2018-01-12 MED ORDER — ACETAMINOPHEN 10 MG/ML IV SOLN
INTRAVENOUS | Status: AC
  Filled 2018-01-12: qty 100

## 2018-01-12 MED ORDER — SODIUM CHLORIDE 0.9 % IV SOLN
INTRAVENOUS | Status: DC
  Administered 2018-01-12: 09:00:00 via INTRAVENOUS

## 2018-01-12 MED ORDER — MIDAZOLAM HCL 2 MG/2ML IJ SOLN
INTRAMUSCULAR | Status: AC
  Filled 2018-01-12: qty 2

## 2018-01-12 MED ORDER — LIDOCAINE HCL (CARDIAC) PF 100 MG/5ML IV SOSY
PREFILLED_SYRINGE | INTRAVENOUS | Status: DC | PRN
  Administered 2018-01-12: 80 mg via INTRAVENOUS

## 2018-01-12 MED ORDER — FENTANYL CITRATE (PF) 100 MCG/2ML IJ SOLN: 25.0000 ug | INTRAMUSCULAR | Status: DC | PRN

## 2018-01-12 MED ORDER — TECHNETIUM TC 99M SULFUR COLLOID FILTERED
0.8100 | Freq: Once | INTRAVENOUS | Status: AC | PRN
  Administered 2018-01-12: 0.81 via INTRADERMAL

## 2018-01-12 MED ORDER — KETOROLAC TROMETHAMINE 30 MG/ML IJ SOLN
INTRAMUSCULAR | Status: DC | PRN
  Administered 2018-01-12: 30 mg via INTRAVENOUS

## 2018-01-12 MED ORDER — KETOROLAC TROMETHAMINE 30 MG/ML IJ SOLN
INTRAMUSCULAR | Status: AC
  Filled 2018-01-12: qty 1

## 2018-01-12 MED ORDER — PROPOFOL 10 MG/ML IV BOLUS
INTRAVENOUS | Status: DC | PRN
  Administered 2018-01-12: 150 mg via INTRAVENOUS

## 2018-01-12 MED ORDER — BUPIVACAINE HCL (PF) 0.5 % IJ SOLN
INTRAMUSCULAR | Status: DC | PRN
  Administered 2018-01-12: 30 mL

## 2018-01-12 MED ORDER — GLYCOPYRROLATE 0.2 MG/ML IJ SOLN
INTRAMUSCULAR | Status: DC | PRN
  Administered 2018-01-12: 0.2 mg via INTRAVENOUS

## 2018-01-12 MED ORDER — HYDROCODONE-ACETAMINOPHEN 5-325 MG PO TABS
1.0000 | ORAL_TABLET | ORAL | Status: DC | PRN
  Administered 2018-01-12: 1 via ORAL

## 2018-01-12 MED ORDER — FENTANYL CITRATE (PF) 100 MCG/2ML IJ SOLN
INTRAMUSCULAR | Status: DC | PRN
  Administered 2018-01-12 (×3): 25 ug via INTRAVENOUS

## 2018-01-12 MED ORDER — ONDANSETRON HCL 4 MG/2ML IJ SOLN
INTRAMUSCULAR | Status: DC | PRN
  Administered 2018-01-12: 4 mg via INTRAVENOUS

## 2018-01-12 MED ORDER — PROMETHAZINE HCL 25 MG/ML IJ SOLN: 6.2500 mg | INTRAMUSCULAR | Status: DC | PRN

## 2018-01-12 MED ORDER — METHYLENE BLUE 0.5 % INJ SOLN
INTRAVENOUS | Status: DC | PRN
  Administered 2018-01-12: 5 mL via SUBMUCOSAL

## 2018-01-12 SURGICAL SUPPLY — 58 items
APPLIER CLIP 11 MED OPEN (CLIP)
APPLIER CLIP 13 LRG OPEN (CLIP)
BINDER BREAST LRG (GAUZE/BANDAGES/DRESSINGS) IMPLANT
BINDER BREAST MEDIUM (GAUZE/BANDAGES/DRESSINGS) IMPLANT
BINDER BREAST XLRG (GAUZE/BANDAGES/DRESSINGS) ×2 IMPLANT
BINDER BREAST XXLRG (GAUZE/BANDAGES/DRESSINGS) IMPLANT
BLADE PHOTON ILLUMINATED (MISCELLANEOUS) IMPLANT
BLADE SURG 15 STRL SS SAFETY (BLADE) ×2 IMPLANT
BULB RESERV EVAC DRAIN JP 100C (MISCELLANEOUS) IMPLANT
CANISTER SUCT 1200ML W/VALVE (MISCELLANEOUS) ×2 IMPLANT
CHLORAPREP W/TINT 26ML (MISCELLANEOUS) ×2 IMPLANT
CLIP APPLIE 11 MED OPEN (CLIP) IMPLANT
CLIP APPLIE 13 LRG OPEN (CLIP) IMPLANT
CNTNR SPEC 2.5X3XGRAD LEK (MISCELLANEOUS) ×2
CONT SPEC 4OZ STER OR WHT (MISCELLANEOUS) ×2
CONTAINER SPEC 2.5X3XGRAD LEK (MISCELLANEOUS) ×2 IMPLANT
COVER PROBE FLX POLY STRL (MISCELLANEOUS) ×2 IMPLANT
COVER WAND RF STERILE (DRAPES) ×2 IMPLANT
DEVICE DUBIN SPECIMEN MAMMOGRA (MISCELLANEOUS) ×2 IMPLANT
DRAIN CHANNEL JP 15F RND 16 (MISCELLANEOUS) IMPLANT
DRAPE LAPAROTOMY TRNSV 106X77 (MISCELLANEOUS) ×2 IMPLANT
DRSG GAUZE FLUFF 36X18 (GAUZE/BANDAGES/DRESSINGS) ×4 IMPLANT
DRSG TELFA 3X8 NADH (GAUZE/BANDAGES/DRESSINGS) ×2 IMPLANT
ELECT CAUTERY BLADE TIP 2.5 (TIP) ×2
ELECT REM PT RETURN 9FT ADLT (ELECTROSURGICAL) ×2
ELECTRODE CAUTERY BLDE TIP 2.5 (TIP) ×1 IMPLANT
ELECTRODE REM PT RTRN 9FT ADLT (ELECTROSURGICAL) ×1 IMPLANT
GAUZE SPONGE 4X4 12PLY STRL (GAUZE/BANDAGES/DRESSINGS) IMPLANT
GLOVE BIO SURGEON STRL SZ7.5 (GLOVE) ×4 IMPLANT
GLOVE INDICATOR 8.0 STRL GRN (GLOVE) ×4 IMPLANT
GOWN STRL REUS W/ TWL LRG LVL3 (GOWN DISPOSABLE) ×2 IMPLANT
GOWN STRL REUS W/TWL LRG LVL3 (GOWN DISPOSABLE) ×2
KIT TURNOVER KIT A (KITS) ×2 IMPLANT
LABEL OR SOLS (LABEL) ×2 IMPLANT
NDL SAFETY ECLIPSE 18X1.5 (NEEDLE) ×1 IMPLANT
NEEDLE HYPO 18GX1.5 SHARP (NEEDLE) ×1
NEEDLE HYPO 22GX1.5 SAFETY (NEEDLE) ×2 IMPLANT
PACK BASIN MINOR ARMC (MISCELLANEOUS) ×2 IMPLANT
PIN SAFETY STRL (MISCELLANEOUS) IMPLANT
RETRACTOR RING XSMALL (MISCELLANEOUS) ×1 IMPLANT
RTRCTR WOUND ALEXIS 13CM XS SH (MISCELLANEOUS) ×2
SHEARS FOC LG CVD HARMONIC 17C (MISCELLANEOUS) IMPLANT
SLEVE PROBE SENORX GAMMA FIND (MISCELLANEOUS) ×2 IMPLANT
SPONGE LAP 18X18 RF (DISPOSABLE) ×2 IMPLANT
STRIP CLOSURE SKIN 1/2X4 (GAUZE/BANDAGES/DRESSINGS) ×4 IMPLANT
SUT ETHILON 3-0 FS-10 30 BLK (SUTURE) ×2
SUT SILK 2 0 (SUTURE) ×1
SUT SILK 2-0 30XBRD TIE 12 (SUTURE) ×1 IMPLANT
SUT VIC AB 2-0 CT1 27 (SUTURE) ×4
SUT VIC AB 2-0 CT1 TAPERPNT 27 (SUTURE) ×4 IMPLANT
SUT VIC AB 3-0 SH 27 (SUTURE) ×1
SUT VIC AB 3-0 SH 27X BRD (SUTURE) ×1 IMPLANT
SUT VIC AB 4-0 FS2 27 (SUTURE) ×2 IMPLANT
SUT VICRYL+ 3-0 144IN (SUTURE) ×2 IMPLANT
SUTURE EHLN 3-0 FS-10 30 BLK (SUTURE) ×1 IMPLANT
SWABSTK COMLB BENZOIN TINCTURE (MISCELLANEOUS) ×2 IMPLANT
SYR 10ML LL (SYRINGE) ×2 IMPLANT
TAPE TRANSPORE STRL 2 31045 (GAUZE/BANDAGES/DRESSINGS) IMPLANT

## 2018-01-12 NOTE — Discharge Instructions (Signed)

## 2018-01-12 NOTE — H&P (Signed)
Carolyn Roy [id number] May 10, 1965     HPI:  52 y/o woman s/p neo-adjuvant chemotherapy. Desires breast conservation. Tolerated SLN injection and wire localization well.   Medications Prior to Admission  Medication Sig Dispense Refill Last Dose  . albuterol (PROVENTIL HFA;VENTOLIN HFA) 108 (90 Base) MCG/ACT inhaler Inhale 2 puffs into the lungs every 6 (six) hours as needed for wheezing or shortness of breath. 1 Inhaler 0 Taking  . aspirin-acetaminophen-caffeine (EXCEDRIN MIGRAINE) 250-250-65 MG tablet Take 2 tablets by mouth daily as needed for headache.     [name] atorvastatin (LIPITOR) 40 MG tablet Take 1 tablet (40 mg total) by mouth daily. (Patient taking differently: Take 40 mg by mouth every morning. ) 90 tablet 3 Taking  . canagliflozin (INVOKANA) 300 MG TABS tablet Take 300 mg by mouth daily.     . cholecalciferol (VITAMIN D3) 25 MCG (1000 UT) tablet Take 2,000 Units by mouth 2 (two) times daily.     [name] gabapentin (NEURONTIN) 300 MG capsule Take 1 Capsule (300mg ) by mouth in the morning, 1 capsule (300mg ) mid-day, and 3 capsules (900 mg) at bedtime for nerve pain. (Patient taking differently: Take 3 Capsule (300mg ) by mouth in the morning, 2 capsule (300mg ) mid-day, and 3 capsules (900 mg) at bedtime for nerve pain.) 150 capsule 0 Taking  . lidocaine-prilocaine (EMLA) cream Apply to affected area once (Patient taking differently: Apply 1 application topically daily as needed (port access). Ap) 30 g 3 Taking  . metFORMIN (GLUCOPHAGE-XR) 500 MG 24 hr tablet Take 1,000 mg by mouth 2 (two) times daily.     . ondansetron (ZOFRAN) 8 MG tablet Take 1 tablet (8 mg total) by mouth 2 (two) times daily as needed. Start on the third day after chemotherapy. 30 tablet 1 Taking  . prochlorperazine (COMPAZINE) 10 MG tablet Take 1 tablet (10 mg total) by mouth every 6 (six) hours as needed (Nausea or vomiting). 30 tablet 1 Taking  . traMADol (ULTRAM) 50 MG tablet Take 1 tablet (50 mg total) by mouth every 6  (six) hours as needed for severe pain. 30 tablet 0 Taking  . glucose blood (ONE TOUCH ULTRA TEST) test strip Use up to 4 times/day 100 each 12 Taking  . Insulin Glargine (BASAGLAR KWIKPEN) 100 UNIT/ML SOPN INJECT 0.24 MLS (24 UNITS TOTAL) INTO THE SKIN AT BEDTIME. 15 pen 2 Taking  . Insulin Pen Needle 32G X 4 MM MISC 1 Units by Does not apply route every morning. Pen needles 90 each 3 Taking  . INVOKANA 100 MG TABS tablet TAKE 1 TABLET (100 MG TOTAL) BY MOUTH DAILY BEFORE BREAKFAST. (Patient not taking: No sig reported) 30 tablet 0 Not Taking at Unknown time  . metFORMIN (GLUCOPHAGE) 500 MG tablet TAKE 2 TABLETS (1,000 MG TOTAL) BY MOUTH 2 (TWO) TIMES DAILY WITH A MEAL. (Patient not taking: Reported on 12/26/2017) 120 tablet 1 Not Taking at Unknown time   Allergies  Allergen Reactions  . Aspirin Nausea And Vomiting   Past Medical History:  Diagnosis Date  . Cancer (HCC)    BREAST  . Depression   . Diabetes mellitus without complication ([place]) 0102  . Endometriosis   . Family history of breast cancer   . Headache    migraines  . Hyperlipidemia   . Ovarian mass   . Pneumonia    2018   Past Surgical History:  Procedure Laterality Date  . AXILLARY LYMPH NODE BIOPSY Left 07/14/2017   Procedure: INSERTION GEL MARK CLIP LEFT AXILLA;  Surgeon:  Robert Bellow, MD;  Location: [facility] ORS;  Service: General;  Laterality: Left;  . BREAST BIOPSY Left    Dr Orlene Och BREAST METASTATIC CARCINOMA  . BREAST LUMPECTOMY Left 01/12/2018  . OOPHORECTOMY    . PORTACATH PLACEMENT Right 07/14/2017   Procedure: INSERTION PORT-A-CATH;  Surgeon: Robert Bellow, MD;  Location: [facility] ORS;  Service: General;  Laterality: Right;  . TUBAL LIGATION     Social History   Socioeconomic History  . Marital status: Single    Spouse name: Not on file  . Number of children: Not on file  . Years of education: Not on file  . Highest education level: Not on file  Occupational History  . Not on file  Social  Needs  . Financial resource strain: Not on file  . Food insecurity:    Worry: Not on file    Inability: Not on file  . Transportation needs:    Medical: Not on file    Non-medical: Not on file  Tobacco Use  . Smoking status: Current Every Day Smoker    Packs/day: 0.50    Years: 11.00    Pack years: 5.50    Types: Cigarettes  . Smokeless tobacco: Former Systems developer    Types: Snuff  Substance and Sexual Activity  . Alcohol use: No    Alcohol/week: 0.0 standard drinks  . Drug use: No  . Sexual activity: Yes  Lifestyle  . Physical activity:    Days per week: Not on file    Minutes per session: Not on file  . Stress: Not on file  Relationships  . Social connections:    Talks on phone: Not on file    Gets together: Not on file    Attends religious service: Not on file    Active member of club or organization: Not on file    Attends meetings of clubs or organizations: Not on file    Relationship status: Not on file  . Intimate partner violence:    Fear of current or ex partner: Not on file    Emotionally abused: Not on file    Physically abused: Not on file    Forced sexual activity: Not on file  Other Topics Concern  . Not on file  Social History Narrative  . Not on file   Social History   Social History Narrative  . Not on file     ROS: Negative.     PE: HEENT: Negative. Lungs: Clear. Cardio: RR.  Assessment/Plan:  Proceed with planned left axillary SLN biopsy, possible axillary dissection, wide excision left breast cancer.  Carolyn Roy 01/12/2018

## 2018-01-12 NOTE — OR Nursing (Signed)
Discharge instructions discussed with pt and family. All voice understanding. 

## 2018-01-12 NOTE — Op Note (Signed)
Preoperative diagnosis: Invasive mammary carcinoma of the left breast, lower outer quadrant.  Status post neoadjuvant treatment.  Postoperative diagnosis: Same.  Operative procedure: Left breast wide excision with tissue transfer, sentinel node biopsy, ultrasound guidance, methylene blue injection.  Operating Surgeon: Charlie Pitter, MD.  Anesthesia: General by LMA, Marcaine 0.5% with 1 to 200,000 units of epinephrine, 30 cc.  Estimated blood loss: 10 cc.  Clinical note: This 52 year old woman was identified with a 5.1 cm ER/PR positive, HER-2/neu negative mass in the left breast.  She is undergone neoadjuvant chemotherapy.  She is had an excellent clinical response.  The previously palpable left breast lesion is no longer notable but the axillary node remains enlarged on ultrasound.  She is planned for wire localization and sentinel node biopsy with possible axillary dissection.  SCD stockings for DVT prevention.  Operative note: With the patient under adequate general anesthesia the areolar area was cleansed with alcohol and 5 cc of 0.5% methylene blue instilled.  She had previously undergone injection with technetium sulfur colloid.  The breast chest and axilla was then carefully prepped with ChloraPrep and draped.  Needle localizing wire was protected.  Attention was turned to the axilla.  Ultrasound was used to confirm the area of enlarged lymph node.  The area was opened through a transverse incision after instillation of local anesthesia.  The skin was incised sharply and remaining dissection completed with electrocautery.  Hemostasis was with 3-0 Vicryl ties as indicated.  The axillary envelope was opened.  The previously placed gel Mark clip was identified within 1 of the 2 for sentinel nodes identified.  Touch preps were negative.  A third sentinel node that was hot and blue was identified and this was also negative by touch prep.  While the lymph nodes were being processed attention was  turned to the breast.  Ultrasound was used to identify the tip of the wire in the 5 o'clock position of the breast corresponding to the preoperative office ultrasound.  A radial incision from the base of the nipple was carried down to above the level of the inframammary fold.  Local anesthesia was infiltrated prior to incision.  The skin and adipose tissue was divided and an Ivy wound protector was placed.  A 4 x 4 x 4 cm block of tissue was excised, orientated and specimen radiograph confirmed both the previously placed clip and the intact wire.  Gross examination showed the margins clinically negative.    While the breast specimen was processed the axilla was closed with a 2-0 Vicryl figure-of-eight suture to the axillary envelope and then an additional layer of 2-0 Vicryl figure-of-eight sutures.  The most superficial fat was approximated with a running 3-0 Vicryl suture and the skin closed with a running 4-0 Vicryl subcuticular suture.    With the final report on the breast specimen attention was turned towards closure of the wide excision site.  With the volume of tissue removed it was necessary to elevate the breast parenchyma off the underlying pectoralis muscle.  This was then approximated with interrupted 2-0 Vicryl figure-of-eight sutures.  The deep tissue was approximated in multiple layers in a similar fashion.  There was some tethering of the skin at the 2 o'clock position and a flap was made about 5 mm in thickness from the upper edge of the incision to smooth this area out.  The skin was then closed with a running 4-0 Vicryl subcuticular suture.  Benzoin Steri-Strips followed by Telfa, fluff gauze and a compressive  binder were applied.  The patient tolerated the procedure well and was taken to the recovery room in stable condition.

## 2018-01-12 NOTE — Anesthesia Postprocedure Evaluation (Signed)
Anesthesia Post Note  Patient: Carolyn Roy  Procedure(s) Performed: PARTIAL MASTECTOMY WITH NEEDLE LOCALIZATION (Left ) SENTINEL NODE BIOPSY (Left )  Patient location during evaluation: PACU Anesthesia Type: General Level of consciousness: awake and alert Pain management: pain level controlled Vital Signs Assessment: post-procedure vital signs reviewed and stable Respiratory status: spontaneous breathing, nonlabored ventilation, respiratory function stable and patient connected to nasal cannula oxygen Cardiovascular status: blood pressure returned to baseline and stable Postop Assessment: no apparent nausea or vomiting Anesthetic complications: no     Last Vitals:  Vitals:   01/12/18 1231 01/12/18 1257  BP: 108/79 115/81  Pulse: 95   Resp: 18   Temp: (!) 36.2 C   SpO2: 99%     Last Pain:  Vitals:   01/12/18 1259  TempSrc:   PainSc: 5                  Martha Clan

## 2018-01-12 NOTE — Anesthesia Procedure Notes (Signed)
Procedure Name: LMA Insertion Date/Time: 01/12/2018 9:41 AM Performed by: Lowry Bowl, CRNA Pre-anesthesia Checklist: Emergency Drugs available, Patient identified, Suction available and Patient being monitored Patient Re-evaluated:Patient Re-evaluated prior to induction Oxygen Delivery Method: Circle system utilized Preoxygenation: Pre-oxygenation with 100% oxygen Induction Type: IV induction Ventilation: Mask ventilation without difficulty LMA: LMA inserted LMA Size: 4.0 Number of attempts: 1 Placement Confirmation: breath sounds checked- equal and bilateral and positive ETCO2 Tube secured with: Tape Dental Injury: Teeth and Oropharynx as per pre-operative assessment

## 2018-01-12 NOTE — Transfer of Care (Signed)
Immediate Anesthesia Transfer of Care Note  Patient: Carolyn Roy  Procedure(s) Performed: PARTIAL MASTECTOMY WITH NEEDLE LOCALIZATION (Left ) SENTINEL NODE BIOPSY (Left )  Patient Location: PACU  Anesthesia Type:General  Level of Consciousness: sedated  Airway & Oxygen Therapy: Patient Spontanous Breathing and Patient connected to face mask oxygen  Post-op Assessment: Report given to RN and Post -op Vital signs reviewed and stable  Post vital signs: Reviewed and stable  Last Vitals:  Vitals Value Taken Time  BP 91/60 01/12/2018 11:43 AM  Temp 36.6 C 01/12/2018 11:43 AM  Pulse 90 01/12/2018 11:43 AM  Resp 12 01/12/2018 11:43 AM  SpO2 96 % 01/12/2018 11:43 AM  Vitals shown include unvalidated device data.  Last Pain:  Vitals:   01/12/18 1143  TempSrc:   PainSc: 0-No pain         Complications: No apparent anesthesia complications

## 2018-01-12 NOTE — Anesthesia Preprocedure Evaluation (Signed)
Anesthesia Evaluation  Patient identified by MRN, date of birth, ID band Patient awake    Reviewed: Allergy & Precautions, NPO status , Patient's Chart, lab work & pertinent test results  History of Anesthesia Complications Negative for: history of anesthetic complications  Airway Mallampati: I       Dental  (+) Dental Advidsory Given, Missing, Poor Dentition   Pulmonary neg shortness of breath, neg sleep apnea, pneumonia, resolved, COPD,  COPD inhaler, neg recent URI, Current Smoker,           Cardiovascular hypertension, Pt. on medications (-) angina(-) Past MI and (-) CHF (-) dysrhythmias (-) Valvular Problems/Murmurs     Neuro/Psych neg Seizures PSYCHIATRIC DISORDERS Depression    GI/Hepatic Neg liver ROS, neg GERD  ,  Endo/Other  diabetes, Type 2, Oral Hypoglycemic Agents, Insulin Dependent  Renal/GU negative Renal ROS     Musculoskeletal   Abdominal   Peds  Hematology   Anesthesia Other Findings Past Medical History: No date: Cancer [facility])     Comment:  BREAST No date: Depression 2017: Diabetes mellitus without complication ([place]) No date: Endometriosis No date: Family history of breast cancer No date: Headache     Comment:  migraines No date: Hyperlipidemia No date: Ovarian mass No date: Pneumonia     Comment:  2018   Reproductive/Obstetrics                             Anesthesia Physical  Anesthesia Plan  ASA: III  Anesthesia Plan: General   Post-op Pain Management:    Induction: Intravenous  PONV Risk Score and Plan: 2 and Ondansetron, Dexamethasone, Midazolam, Promethazine and Treatment may vary due to age or medical condition  Airway Management Planned: LMA  Additional Equipment:   Intra-op Plan:   Post-operative Plan: Extubation in OR  Informed Consent: I have reviewed the patients History and Physical, chart, labs and discussed the procedure including the  risks, benefits and alternatives for the proposed anesthesia with the patient or authorized representative who has indicated his/her understanding and acceptance.     Plan Discussed with:   Anesthesia Plan Comments:         Anesthesia Quick Evaluation

## 2018-01-12 NOTE — Anesthesia Post-op Follow-up Note (Signed)
Anesthesia QCDR form completed.        

## 2018-01-13 ENCOUNTER — Encounter: Payer: Self-pay | Admitting: General Surgery

## 2018-01-17 ENCOUNTER — Ambulatory Visit: Payer: Managed Care, Other (non HMO)

## 2018-01-18 ENCOUNTER — Ambulatory Visit: Payer: Self-pay | Admitting: Nurse Practitioner

## 2018-01-18 ENCOUNTER — Other Ambulatory Visit: Payer: Self-pay | Admitting: Anatomic Pathology & Clinical Pathology

## 2018-01-18 ENCOUNTER — Ambulatory Visit (INDEPENDENT_AMBULATORY_CARE_PROVIDER_SITE_OTHER): Payer: 59 | Admitting: General Surgery

## 2018-01-18 ENCOUNTER — Other Ambulatory Visit: Payer: Self-pay

## 2018-01-18 ENCOUNTER — Telehealth: Payer: Self-pay | Admitting: General Surgery

## 2018-01-18 ENCOUNTER — Encounter: Payer: Self-pay | Admitting: General Surgery

## 2018-01-18 VITALS — BP 114/78 | HR 101 | Temp 97.3°F | Resp 14 | Ht 63.0 in | Wt 176.0 lb

## 2018-01-18 DIAGNOSIS — Z170 Estrogen receptor positive status [ER+]: Secondary | ICD-10-CM

## 2018-01-18 DIAGNOSIS — C50512 Malignant neoplasm of lower-outer quadrant of left female breast: Secondary | ICD-10-CM

## 2018-01-18 LAB — SURGICAL PATHOLOGY

## 2018-01-18 NOTE — Telephone Encounter (Signed)
Message left that pathology showed complete pathologic response. F/U as scheduled.

## 2018-01-18 NOTE — Progress Notes (Signed)
Patient ID: SARHA BARTELT, female   DOB: 1965/02/15, 52 y.o.   MRN: [id number]  Chief Complaint  Patient presents with  . Routine Post Op    6 day post op partial left mastectomy sx 01/12/18    HPI KADEE PHILYAW is a 52 y.o. female here today to follow up for post op partial left mastectomy sx 01/12/18. Patient states she is feeling a little tired today she is here today with her husband.   HPI  Past Medical History:  Diagnosis Date  . Cancer ([place]) 06/15/2017   5.1 cm, T3,N1 (clinical): ER/ PR positive, Her 2 neu not overexpressed, High Ki 67. Neuoadjuvant chemotherapy.   . Depression   . Diabetes mellitus without complication ([place]) 3419  . Endometriosis   . Family history of breast cancer   . Headache    migraines  . Hyperlipidemia   . Ovarian mass   . Pneumonia    2018    Past Surgical History:  Procedure Laterality Date  . AXILLARY LYMPH NODE BIOPSY Left 07/14/2017   Procedure: INSERTION GEL MARK CLIP LEFT AXILLA;  Surgeon: Robert Bellow, MD;  Location: [facility] ORS;  Service: General;  Laterality: Left;  . BREAST BIOPSY Left    Dr Orlene Och BREAST METASTATIC CARCINOMA  . BREAST LUMPECTOMY Left 01/12/2018  . OOPHORECTOMY    . PARTIAL MASTECTOMY WITH NEEDLE LOCALIZATION Left 01/12/2018   Procedure: PARTIAL MASTECTOMY WITH NEEDLE LOCALIZATION;  Surgeon: Robert Bellow, MD;  Location: [facility] ORS;  Service: General;  Laterality: Left;  . PORTACATH PLACEMENT Right 07/14/2017   Procedure: INSERTION PORT-A-CATH;  Surgeon: Robert Bellow, MD;  Location: [facility] ORS;  Service: General;  Laterality: Right;  . SENTINEL NODE BIOPSY Left 01/12/2018   Procedure: SENTINEL NODE BIOPSY;  Surgeon: Robert Bellow, MD;  Location: [facility] ORS;  Service: General;  Laterality: Left;  . TUBAL LIGATION      Family History  Problem Relation Age of Onset  . Other Father        No info about father or paternal relatives  . Diabetes Brother   . Pancreatitis Brother   . Prostate cancer  Brother 14       currently 51 / maternal half-brother  . Breast cancer Maternal Grandmother 40       deceased 68s  . Breast cancer Maternal Aunt 65       currently 51  . Breast cancer Other 84       mother's sister; deceased 38  . Breast cancer Other        mother's sister; age at dx unknown    Social History Social History   Tobacco Use  . Smoking status: Current Every Day Smoker    Packs/day: 0.50    Years: 11.00    Pack years: 5.50    Types: Cigarettes  . Smokeless tobacco: Former Systems developer    Types: Snuff  Substance Use Topics  . Alcohol use: No    Alcohol/week: 0.0 standard drinks  . Drug use: No    Allergies  Allergen Reactions  . Aspirin Nausea And Vomiting    Current Outpatient Medications  Medication Sig Dispense Refill  . albuterol (PROVENTIL HFA;VENTOLIN HFA) 108 (90 Base) MCG/ACT inhaler Inhale 2 puffs into the lungs every 6 (six) hours as needed for wheezing or shortness of breath. 1 Inhaler 0  . aspirin-acetaminophen-caffeine (EXCEDRIN MIGRAINE) 250-250-65 MG tablet Take 2 tablets by mouth daily as needed for headache.    [name] atorvastatin (LIPITOR) 40 MG  tablet Take 1 tablet (40 mg total) by mouth daily. (Patient taking differently: Take 40 mg by mouth every morning. ) 90 tablet 3  . canagliflozin (INVOKANA) 300 MG TABS tablet Take 300 mg by mouth daily.    . cholecalciferol (VITAMIN D3) 25 MCG (1000 UT) tablet Take 2,000 Units by mouth 2 (two) times daily.    [name] gabapentin (NEURONTIN) 300 MG capsule Take 1 Capsule (300mg ) by mouth in the morning, 1 capsule (300mg ) mid-day, and 3 capsules (900 mg) at bedtime for nerve pain. (Patient taking differently: Take 3 Capsule (300mg ) by mouth in the morning, 2 capsule (300mg ) mid-day, and 3 capsules (900 mg) at bedtime for nerve pain.) 150 capsule 0  . gabapentin (NEURONTIN) 300 MG capsule Takes three capsules three times a day    . glucose blood (ONE TOUCH ULTRA TEST) test strip Use up to 4 times/day 100 each 12  .  HYDROcodone-acetaminophen (NORCO/VICODIN) 5-325 MG tablet Take 1 tablet by mouth every 4 (four) hours as needed for moderate pain. 20 tablet 0  . Insulin Glargine (BASAGLAR KWIKPEN) 100 UNIT/ML SOPN INJECT 0.24 MLS (24 UNITS TOTAL) INTO THE SKIN AT BEDTIME. 15 pen 2  . Insulin Pen Needle 32G X 4 MM MISC 1 Units by Does not apply route every morning. Pen needles 90 each 3  . lidocaine-prilocaine (EMLA) cream Apply to affected area once (Patient taking differently: Apply 1 application topically daily as needed (port access). Ap) 30 g 3  . metFORMIN (GLUCOPHAGE-XR) 500 MG 24 hr tablet Take 1,000 mg by mouth 2 (two) times daily.    . ondansetron (ZOFRAN) 8 MG tablet Take 1 tablet (8 mg total) by mouth 2 (two) times daily as needed. Start on the third day after chemotherapy. 30 tablet 1  . prochlorperazine (COMPAZINE) 10 MG tablet Take 1 tablet (10 mg total) by mouth every 6 (six) hours as needed (Nausea or vomiting). 30 tablet 1  . traMADol (ULTRAM) 50 MG tablet Take 1 tablet (50 mg total) by mouth every 6 (six) hours as needed for severe pain. 30 tablet 0  . traMADol (ULTRAM) 50 MG tablet Take by mouth.     No current facility-administered medications for this visit.     Review of Systems Review of Systems  Constitutional: Negative.   Cardiovascular: Negative.     Blood pressure 114/78, pulse (!) 101, temperature (!) 97.3 F (36.3 C), temperature source Temporal, resp. rate 14, height 5\' 3"  (1.6 m), weight 176 lb (79.8 kg), SpO2 94 %.  Physical Exam Physical Exam Constitutional:      Appearance: She is well-developed.  Eyes:     General: No scleral icterus. Chest:     Breasts: Breasts are symmetrical.        Right: No inverted nipple, mass, nipple discharge, skin change or tenderness.     Lymphadenopathy:     Cervical: No cervical adenopathy.  Skin:    General: Skin is warm and dry.  Neurological:     Mental Status: She is alert and oriented to person, place, and time.      Data Reviewed Surgical Pathology  CASE: [id number]  PATIENT: Cerita WHITE  Surgical Pathology Report      SPECIMEN SUBMITTED:  A. Sentinel node 1 and 2, left axillary, and clip  B. Breast, left lower outer quadrant; wide excision  C. Sentinel node 3  D. NON sentinel node   CLINICAL HISTORY:  Outside biopsy on 06/14/2017 showing grade 3 invasive ductal carcinoma of  the LEFT  breast at 4 o'clock. Patient underwent neoadjuvant chemotherapy  prior to surgery.   PRE-OPERATIVE DIAGNOSIS:  Left breast cancer   POST-OPERATIVE DIAGNOSIS:  Same as pre-op      DIAGNOSIS:  A. LYMPH NODES, LEFT AXILLARY SENTINEL #1 AND #2; EXCISION:  - TWO LYMPH NODES, NEGATIVE FOR MALIGNANCY (0/2).  - METALLIC CLIP PRESENT.   B. BREAST, LEFT LOWER OUTER QUADRANT; WIDE EXCISION:  - BENIGN INTRADUCTAL PAPILLOMA AND FIBROCYSTIC CHANGES WITH ASSOCIATED  CALCIFICATIONS.  - USUAL DUCTAL HYPERPLASIA.  - METALLIC CLIP PRESENT.  - DENSE FIBROSIS AND SCARRING, CONSISTENT WITH TREATMENT EFFECT.  - NO EVIDENCE OF RESIDUAL INVASIVE CARCINOMA.  - SEE CANCER SUMMARY.   C. LYMPH NODE, LEFT AXILLARY SENTINEL #3; EXCISION:  - ONE LYMPH NODE, NEGATIVE FOR MALIGNANCY (0/1).   D. LYMPH NODES, NON-SENTINEL; EXCISION:  - TWO LYMPH NODES, NEGATIVE FOR MALIGNANCY (0/2).   CANCER CASE SUMMARY: INVASIVE CARCINOMA OF THE BREAST  Procedure: Excision  Specimen Laterality: Left  Tumor Size: No residual invasive carcinoma  Histologic Type: No residual invasive carcinoma  Histologic Grade ([place] Histologic Score)            No residual invasive carcinoma   Ductal Carcinoma In Situ (DCIS): Not identified  Margins: No residual invasive carcinoma            DCIS Margins: Not applicable  Regional Lymph Nodes: Uninvolved by tumor cells  Number of Lymph Nodes Examined: 5  Number of Sentinel Nodes Examined: 3  Treatment Effect in the Breast: No residual invasive carcinoma is   present in the breast after presurgical therapy  Treatment Effect in the Lymph Nodes: No lymph node metastases and no  prominent fibrous scarring in the nodes  Residual Cancer Burden (RCB) from [facility] calculator (website  below):    Primary Tumor Bed       Primary tumor bed: 0 mm x 0 mm       Overall cancer cellularity: 0 %       Percentage of cancer that is in situ: 0 %    Lymph nodes       Number of lymph nodes positive for metastasis: 0 next field 0       Diameter of largest metastasis: 0 mm    Residual Cancer Burden: 0    Residual Cancer Burden Class: pCR   [email or web address]    Lymphovascular Invasion: Not identified  Pathologic Stage Classification (pTNM, AJCC 8th Edition): ypT0 pN0 (sn)  (f)   Assessment    Doing well post wide excision and sentinel node biopsy.    Plan The case was reviewed with the pathologist by phone.  No evidence of regressive changes in the axilla.  While the biopsy clip placed prior to neoadjuvant chemotherapy was retrieved, no adjacent nodal tissue.  Careful exploration of the axilla have been undertaken and no obvious nodal tissue was in the area.  Preoperative FNA had been positive for malignancy.  The patient plans to contact radiation oncology to arrange assessment in about 2 weeks.  She will continue to wear her bra day and night for support until the heaviness in the breast resolves.  Patient is to return to the office in 2 weeks. Continue to wear your bra for support.  HPI, Physical Exam, Assessment and Plan have been scribed under the direction and in the presence of Hervey Ard, Md.  Eudelia Bunch R. Bobette Mo, CMA  I have completed the exam and reviewed the above documentation for accuracy and completeness.  I agree with  the above.  Haematologist has been used and any errors in dictation or transcription are unintentional.  Hervey Ard,  M.D., F.A.C.S.  [name] Jori Thrall 01/18/2018, 11:03 AM

## 2018-01-18 NOTE — Patient Instructions (Addendum)
Patient is to return to the office in 2 weeks. Continue to wear your bra for support.   Call the office with any questions or concerns.

## 2018-01-22 ENCOUNTER — Encounter: Payer: Self-pay | Admitting: Nurse Practitioner

## 2018-01-22 ENCOUNTER — Ambulatory Visit
Admission: RE | Admit: 2018-01-22 | Discharge: 2018-01-22 | Disposition: A | Discharge status: Home / Self Care | Payer: 59 | Source: Ambulatory Visit | Attending: Nurse Practitioner | Admitting: Nurse Practitioner

## 2018-01-22 ENCOUNTER — Other Ambulatory Visit: Payer: Self-pay

## 2018-01-22 ENCOUNTER — Ambulatory Visit (HOSPITAL_BASED_OUTPATIENT_CLINIC_OR_DEPARTMENT_OTHER): Payer: 59 | Admitting: Nurse Practitioner

## 2018-01-22 VITALS — BP 107/69 | HR 91 | Temp 98.0°F | Ht 63.0 in | Wt 176.0 lb

## 2018-01-22 DIAGNOSIS — R208 Other disturbances of skin sensation: Secondary | ICD-10-CM

## 2018-01-22 DIAGNOSIS — M25561 Pain in right knee: Secondary | ICD-10-CM | POA: Insufficient documentation

## 2018-01-22 DIAGNOSIS — M79671 Pain in right foot: Secondary | ICD-10-CM | POA: Insufficient documentation

## 2018-01-22 DIAGNOSIS — G8929 Other chronic pain: Secondary | ICD-10-CM | POA: Diagnosis present

## 2018-01-22 DIAGNOSIS — M79641 Pain in right hand: Secondary | ICD-10-CM

## 2018-01-22 DIAGNOSIS — E1140 Type 2 diabetes mellitus with diabetic neuropathy, unspecified: Secondary | ICD-10-CM

## 2018-01-22 DIAGNOSIS — Z886 Allergy status to analgesic agent status: Secondary | ICD-10-CM | POA: Insufficient documentation

## 2018-01-22 DIAGNOSIS — G894 Chronic pain syndrome: Secondary | ICD-10-CM | POA: Insufficient documentation

## 2018-01-22 DIAGNOSIS — M79602 Pain in left arm: Secondary | ICD-10-CM

## 2018-01-22 DIAGNOSIS — Z5181 Encounter for therapeutic drug level monitoring: Secondary | ICD-10-CM

## 2018-01-22 DIAGNOSIS — Z79899 Other long term (current) drug therapy: Secondary | ICD-10-CM | POA: Insufficient documentation

## 2018-01-22 DIAGNOSIS — R202 Paresthesia of skin: Secondary | ICD-10-CM | POA: Insufficient documentation

## 2018-01-22 DIAGNOSIS — M79601 Pain in right arm: Secondary | ICD-10-CM | POA: Insufficient documentation

## 2018-01-22 DIAGNOSIS — F17210 Nicotine dependence, cigarettes, uncomplicated: Secondary | ICD-10-CM

## 2018-01-22 DIAGNOSIS — G5793 Unspecified mononeuropathy of bilateral lower limbs: Secondary | ICD-10-CM | POA: Diagnosis not present

## 2018-01-22 DIAGNOSIS — Z789 Other specified health status: Secondary | ICD-10-CM

## 2018-01-22 DIAGNOSIS — Z833 Family history of diabetes mellitus: Secondary | ICD-10-CM | POA: Insufficient documentation

## 2018-01-22 DIAGNOSIS — Z794 Long term (current) use of insulin: Secondary | ICD-10-CM | POA: Insufficient documentation

## 2018-01-22 DIAGNOSIS — M25551 Pain in right hip: Secondary | ICD-10-CM | POA: Insufficient documentation

## 2018-01-22 DIAGNOSIS — M79642 Pain in left hand: Secondary | ICD-10-CM | POA: Insufficient documentation

## 2018-01-22 DIAGNOSIS — M25562 Pain in left knee: Secondary | ICD-10-CM

## 2018-01-22 DIAGNOSIS — F329 Major depressive disorder, single episode, unspecified: Secondary | ICD-10-CM

## 2018-01-22 DIAGNOSIS — M25552 Pain in left hip: Secondary | ICD-10-CM | POA: Insufficient documentation

## 2018-01-22 DIAGNOSIS — E785 Hyperlipidemia, unspecified: Secondary | ICD-10-CM

## 2018-01-22 DIAGNOSIS — M899 Disorder of bone, unspecified: Secondary | ICD-10-CM | POA: Insufficient documentation

## 2018-01-22 DIAGNOSIS — Z7982 Long term (current) use of aspirin: Secondary | ICD-10-CM | POA: Insufficient documentation

## 2018-01-22 DIAGNOSIS — M79672 Pain in left foot: Secondary | ICD-10-CM | POA: Insufficient documentation

## 2018-01-22 DIAGNOSIS — R200 Anesthesia of skin: Secondary | ICD-10-CM | POA: Insufficient documentation

## 2018-01-22 NOTE — Patient Instructions (Addendum)
____________________________________________________________________________________________  Appointment Policy Summary  It is our goal and responsibility to provide the medical community with assistance in the evaluation and management of patients with chronic pain. Unfortunately our resources are limited. Because we do not have an unlimited amount of time, or available appointments, we are required to closely monitor and manage their use. The following rules exist to maximize their use:  Patient's responsibilities: 1. Punctuality:  At what time should I arrive? You should be physically present in our office 30 minutes before your scheduled appointment. Your scheduled appointment is with your assigned healthcare provider. However, it takes 5-10 minutes to be "checked-in", and another 15 minutes for the nurses to do the admission. If you arrive to our office at the time you were given for your appointment, you will end up being at least 20-25 minutes late to your appointment with the provider. 2. Tardiness:  What happens if I arrive only a few minutes after my scheduled appointment time? You will need to reschedule your appointment. The cutoff is your appointment time. This is why it is so important that you arrive at least 30 minutes before that appointment. If you have an appointment scheduled for 10:00 AM and you arrive at 10:01, you will be required to reschedule your appointment.  3. Plan ahead:  Always assume that you will encounter traffic on your way in. Plan for it. If you are dependent on a driver, make sure they understand these rules and the need to arrive early. 4. Other appointments and responsibilities:  Avoid scheduling any other appointments before or after your pain clinic appointments.  5. Be prepared:  Write down everything that you need to discuss with your healthcare provider and give this information to the admitting nurse. Write down the medications that you will need  refilled. Bring your pills and bottles (even the empty ones), to all of your appointments, except for those where a procedure is scheduled. 6. No children or pets:  Find someone to take care of them. It is not appropriate to bring them in. 7. Scheduling changes:  We request "advanced notification" of any changes or cancellations. 8. Advanced notification:  Defined as a time period of more than 24 hours prior to the originally scheduled appointment. This allows for the appointment to be offered to other patients. 9. Rescheduling:  When a visit is rescheduled, it will require the cancellation of the original appointment. For this reason they both fall within the category of "Cancellations".  10. Cancellations:  They require advanced notification. Any cancellation less than 24 hours before the  appointment will be recorded as a "No Show". 11. No Show:  Defined as an unkept appointment where the patient failed to notify or declare to the practice their intention or inability to keep the appointment.  Corrective process for repeat offenders:  1. Tardiness: Three (3) episodes of rescheduling due to late arrivals will be recorded as one (1) "No Show". 2. Cancellation or reschedule: Three (3) cancellations or rescheduling will be recorded as one (1) "No Show". 3. "No Shows": Three (3) "No Shows" within a 12 month period will result in discharge from the practice. ____________________________________________________________________________________________   ______________________________________________________________________________________________  Specialty Pain Scale  Introduction:  There are significant differences in how pain is reported. The word pain usually refers to physical pain, but it is also a common synonym of suffering. The medical community uses a scale from 0 (zero) to 10 (ten) to report pain level. Zero (0) is described as "no pain",   while ten (10) is described as "the worse pain  you can imagine". The problem with this scale is that physical pain is reported along with suffering. Suffering refers to mental pain, or more often yet it refers to any unpleasant feeling, emotion or aversion associated with the perception of harm or threat of harm. It is the psychological component of pain.  Pain Specialists prefer to separate the two components. The pain scale used by this practice is the Verbal Numerical Rating Scale (VNRS-11). This scale is for the physical pain only. DO NOT INCLUDE how your pain psychologically affects you. This scale is for adults 21 years of age and older. It has 11 (eleven) levels. The 1st level is 0/10. This means: "right now, I have no pain". In the context of pain management, it also means: "right now, my physical pain is under control with the current therapy".  General Information:  The scale should reflect your current level of pain. Unless you are specifically asked for the level of your worst pain, or your average pain. If you are asked for one of these two, then it should be understood that it is over the past 24 hours.  Levels 1 (one) through 5 (five) are described below, and can be treated as an outpatient. Ambulatory pain management facilities such as ours are more than adequate to treat these levels. Levels 6 (six) through 10 (ten) are also described below, however, these must be treated as a hospitalized patient. While levels 6 (six) and 7 (seven) may be evaluated at an urgent care facility, levels 8 (eight) through 10 (ten) constitute medical emergencies and as such, they belong in a hospital's emergency department. When having these levels (as described below), do not come to our office. Our facility is not equipped to manage these levels. Go directly to an urgent care facility or an emergency department to be evaluated.  Definitions:  Activities of Daily Living (ADL): Activities of daily living (ADL or ADLs) is a term used in healthcare to refer to  people's daily self-care activities. Health professionals often use a person's ability or inability to perform ADLs as a measurement of their functional status, particularly in regard to people post injury, with disabilities and the elderly. There are two ADL levels: Basic and Instrumental. Basic Activities of Daily Living (BADL  or BADLs) consist of self-care tasks that include: Bathing and showering; personal hygiene and grooming (including brushing/combing/styling hair); dressing; Toilet hygiene (getting to the toilet, cleaning oneself, and getting back up); eating and self-feeding (not including cooking or chewing and swallowing); functional mobility, often referred to as "transferring", as measured by the ability to walk, get in and out of bed, and get into and out of a chair; the broader definition (moving from one place to another while performing activities) is useful for people with different physical abilities who are still able to get around independently. Basic ADLs include the things many people do when they get up in the morning and get ready to go out of the house: get out of bed, go to the toilet, bathe, dress, groom, and eat. On the average, loss of function typically follows a particular order. Hygiene is the first to go, followed by loss of toilet use and locomotion. The last to go is the ability to eat. When there is only one remaining area in which the person is independent, there is a 62.9% chance that it is eating and only a 3.5% chance that it is hygiene. Instrumental Activities   of Daily Living (IADL or IADLs) are not necessary for fundamental functioning, but they let an individual live independently in a community. IADL consist of tasks that include: cleaning and maintaining the house; home establishment and maintenance; care of others (including selecting and supervising caregivers); care of pets; child rearing; managing money; managing financials (investments, etc.); meal preparation  and cleanup; shopping for groceries and necessities; moving within the community; safety procedures and emergency responses; health management and maintenance (taking prescribed medications); and using the telephone or other form of communication.  Instructions:  Most patients tend to report their pain as a combination of two factors, their physical pain and their psychosocial pain. This last one is also known as "suffering" and it is reflection of how physical pain affects you socially and psychologically. From now on, report them separately.  From this point on, when asked to report your pain level, report only your physical pain. Use the following table for reference.  Pain Clinic Pain Levels (0-5/10)  Pain Level Score  Description  No Pain 0   Mild pain 1 Nagging, annoying, but does not interfere with basic activities of daily living (ADL). Patients are able to eat, bathe, get dressed, toileting (being able to get on and off the toilet and perform personal hygiene functions), transfer (move in and out of bed or a chair without assistance), and maintain continence (able to control bladder and bowel functions). Blood pressure and heart rate are unaffected. A normal heart rate for a healthy adult ranges from 60 to 100 bpm (beats per minute).   Mild to moderate pain 2 Noticeable and distracting. Impossible to hide from other people. More frequent flare-ups. Still possible to adapt and function close to normal. It can be very annoying and may have occasional stronger flare-ups. With discipline, patients may get used to it and adapt.   Moderate pain 3 Interferes significantly with activities of daily living (ADL). It becomes difficult to feed, bathe, get dressed, get on and off the toilet or to perform personal hygiene functions. Difficult to get in and out of bed or a chair without assistance. Very distracting. With effort, it can be ignored when deeply involved in activities.   Moderately severe pain  4 Impossible to ignore for more than a few minutes. With effort, patients may still be able to manage work or participate in some social activities. Very difficult to concentrate. Signs of autonomic nervous system discharge are evident: dilated pupils (mydriasis); mild sweating (diaphoresis); sleep interference. Heart rate becomes elevated (>115 bpm). Diastolic blood pressure (lower number) rises above 100 mmHg. Patients find relief in laying down and not moving.   Severe pain 5 Intense and extremely unpleasant. Associated with frowning face and frequent crying. Pain overwhelms the senses.  Ability to do any activity or maintain social relationships becomes significantly limited. Conversation becomes difficult. Pacing back and forth is common, as getting into a comfortable position is nearly impossible. Pain wakes you up from deep sleep. Physical signs will be obvious: pupillary dilation; increased sweating; goosebumps; brisk reflexes; cold, clammy hands and feet; nausea, vomiting or dry heaves; loss of appetite; significant sleep disturbance with inability to fall asleep or to remain asleep. When persistent, significant weight loss is observed due to the complete loss of appetite and sleep deprivation.  Blood pressure and heart rate becomes significantly elevated. Caution: If elevated blood pressure triggers a pounding headache associated with blurred vision, then the patient should immediately seek attention at an urgent or emergency care unit, as   these may be signs of an impending stroke.    Emergency Department Pain Levels (6-10/10)  Emergency Room Pain 6 Severely limiting. Requires emergency care and should not be seen or managed at an outpatient pain management facility. Communication becomes difficult and requires great effort. Assistance to reach the emergency department may be required. Facial flushing and profuse sweating along with potentially dangerous increases in heart rate and blood pressure  will be evident.   Distressing pain 7 Self-care is very difficult. Assistance is required to transport, or use restroom. Assistance to reach the emergency department will be required. Tasks requiring coordination, such as bathing and getting dressed become very difficult.   Disabling pain 8 Self-care is no longer possible. At this level, pain is disabling. The individual is unable to do even the most "basic" activities such as walking, eating, bathing, dressing, transferring to a bed, or toileting. Fine motor skills are lost. It is difficult to think clearly.   Incapacitating pain 9 Pain becomes incapacitating. Thought processing is no longer possible. Difficult to remember your own name. Control of movement and coordination are lost.   The worst pain imaginable 10 At this level, most patients pass out from pain. When this level is reached, collapse of the autonomic nervous system occurs, leading to a sudden drop in blood pressure and heart rate. This in turn results in a temporary and dramatic drop in blood flow to the brain, leading to a loss of consciousness. Fainting is one of the body's self defense mechanisms. Passing out puts the brain in a calmed state and causes it to shut down for a while, in order to begin the healing process.    Summary: 1. Refer to this scale when providing [place] with your pain level. 2. Be accurate and careful when reporting your pain level. This will help with your care. 3. Over-reporting your pain level will lead to loss of credibility. 4. Even a level of 1/10 means that there is pain and will be treated at our facility. 5. High, inaccurate reporting will be documented as "Symptom Exaggeration", leading to loss of credibility and suspicions of possible secondary gains such as obtaining more narcotics, or wanting to appear disabled, for fraudulent reasons. 6. Only pain levels of 5 or below will be seen at our facility. 7. Pain levels of 6 and above will be sent to the  Emergency Department and the appointment cancelled. ______________________________________________________________________________________________   Consider physical therapy for bilateral leg weakness. Consider vitamin D replacement therapy of 50,000 units weekly for 12 weeks.  Patient to ask oncology about above since she is still under treatment plan.

## 2018-01-22 NOTE — Progress Notes (Signed)
Patient's Name: Carolyn Roy  MRN: [id number]  Referring Provider: Jannifer Franklin, NP  DOB: March 06, 1965  PCP: Patient, No Pcp Per  DOS: 01/22/2018  Note by: Dionisio David NP  Service setting: Ambulatory outpatient  Specialty: Interventional Pain Management  Location: [facility] (AMB) Pain Management Facility    Patient type: New Patient    Primary Reason(s) for Visit: Initial Patient Evaluation CC: Hand Pain  HPI  Carolyn Roy is a 52 y.o. year old, female patient, who comes today for an initial evaluation. She has Poorly controlled type 2 diabetes mellitus ([place]); Chronic fatigue; Severe depression ([place]); Knee pain, left; Tobacco abuse; Depression, major, single episode, severe ([place]); Hypercholesteremia; Stage 1 mild COPD by GOLD classification ([place]); Cancer of midline of breast, left ([place]); Family history of breast cancer; Cervical polyp; Goals of care, counseling/discussion; Encounter for antineoplastic chemotherapy; Hyperglycemia; Chemotherapy-induced neutropenia ([place]); Chemotherapy-induced neuropathy ([place]); Malignant neoplasm of lower-outer quadrant of left breast of female, estrogen receptor positive ([place]); Burning sensation of feet; Numbness and tingling in both hands; Chronic pain of both feet (Primary Area of Pain) (R>L); Neuropathic pain of both feet; Chronic pain of both knees (Secondary Area of Pain) (R>L); Pain in both hands [facility] Area of Pain) (R>L); Chronic pain syndrome; Pharmacologic therapy; Disorder of skeletal system; Problems influencing health status; Chronic upper extremity pain (Tertiary Area of Pain) (R>L); and Chronic pain of both hips (Fourth Area of Pain) (R>L) on their problem list.. Her primarily concern today is the Hand Pain  Pain Assessment: Location: Right, Left Hand(both knee) Radiating: all over my body Onset: More than a month ago Duration: Chronic pain Quality: Dull, Stabbing, Sharp, Burning Severity: 5 /10 (subjective, self-reported pain score)  Note: Reported  level is compatible with observation.                           Effect on ADL: limits my daily activities Timing: Constant Modifying factors: medications BP: 107/69  HR: 91  Onset and Duration: Gradual and Present longer than 3 months Cause of pain: Unknown Severity: No change since onset, NAS-11 at its worse: 10/10, NAS-11 at its best: 5/10, NAS-11 now: 6/10 and NAS-11 on the average: 7/10 Timing: Morning, Noon, Afternoon, Evening, Night, Not influenced by the time of the day, During activity or exercise, After activity or exercise and After a period of immobility Aggravating Factors: Bending, Kneeling, Lifiting, Motion, Nerve blocks, Prolonged standing, Surgery made it worse, Twisting, Walking and Walking uphill Alleviating Factors: Medications and Warm showers or baths Associated Problems: Constipation, Dizziness, Fatigue, Numbness and Sadness Quality of Pain: Aching, Annoying, Burning, Deep, Dull, Exhausting, Feeling of constriction, Horrible, Stabbing, Tender, Throbbing and Tingling Previous Examinations or Tests: Biopsy, CT scan, MRI scan, X-rays and Nerve conduction test Previous Treatments: Epidural steroid injections and Narcotic medications  The patient comes into the clinics today for the first time for a chronic pain management evaluation.  Orting to the patient her primary area of pain is in her feet.  She has numbness, tingling burning and stabbing pain.  She admits that this is secondary to chemotherapy treatment of Taxol.  She has just completed chemotherapy and is getting ready to start radiation for breast cancer left.  She also has a history of diabetes.  She is status post EMG.  She is currently using gabapentin.  She denies any recent physical therapy.  Her secondary her pain is in her knees.  She admits the pain goes down her shin into  her feet.  She describes as a sharp pain.  She also feels like there is heaviness in her lower extremities.  She denies any previous  surgery, interventional therapy or physical therapy for her knees.  She denies any recent images.  Her next area of pain is in her arms.  She admits that the pain starts in the elbow and goes down into her hands.  She has numbness, swelling and weakness.  Her last area of pain is in her hips.  Today I took the time to provide the patient with information regarding this pain practice. The patient was informed that the practice is divided into two sections: an interventional pain management section, as well as a completely separate and distinct medication management section. I explained that there are procedure days for interventional therapies, and evaluation days for follow-ups and medication management. Because of the amount of documentation required during both, they are kept separated. This means that there is the possibility that she may be scheduled for a procedure on one day, and medication management the next. I have also informed her that because of staffing and facility limitations, this practice will no longer take patients for medication management only. To illustrate the reasons for this, I gave the patient the example of surgeons, and how inappropriate it would be to refer a patient to his/her care, just to write for the post-surgical antibiotics on a surgery done by a different surgeon.   Because interventional pain management is part of the board-certified specialty for the doctors, the patient was informed that joining this practice means that they are open to any and all interventional therapies. I made it clear that this does not mean that they will be forced to have any procedures done. What this means is that I believe interventional therapies to be essential part of the diagnosis and proper management of chronic pain conditions. Therefore, patients not interested in these interventional alternatives will be better served under the care of a different practitioner.  The patient was also  made aware of my Comprehensive Pain Management Safety Guidelines where by joining this practice, they limit all of their nerve blocks and joint injections to those done by our practice, for as long as we are retained to manage their care. Historic Controlled Substance Pharmacotherapy Review  PMP and historical list of controlled substances: Hydrocodone/acetaminophen 5/325 mg, tramadol 50 mg, Highest opioid analgesic regimen found: Hydrocodone/acetaminophen 5/325 mg, 1 tablet 5 times daily for 4 days (fill date 01/12/2018) hydrocodone 25 mg/day Most recent opioid analgesic:  Hydrocodone/acetaminophen 5/325 mg, 1 tablet 5 times daily (fill date 01/12/2018) hydrocodone 25 mg/day Current opioid analgesics: Hydrocodone/acetaminophen 5/325 mg, 1 tablet 5 times daily (fill date 01/12/2018) hydrocodone 25 mg/day Highest recorded MME/day: 25 mg/day MME/day: 5 mg/day Medications: The patient did not bring the medication(s) to the appointment, as requested in our "New Patient Package" Pharmacodynamics: Desired effects: Analgesia: The patient reports >50% benefit. Reported improvement in function: The patient reports medication allows her to accomplish basic ADLs. Clinically meaningful improvement in function (CMIF): Sustained CMIF goals met Perceived effectiveness: Described as relatively effective, allowing for increase in activities of daily living (ADL) Undesirable effects: Side-effects or Adverse reactions: None reported Historical Monitoring: The patient  reports no history of drug use. List of all UDS Test(s): No results found for: MDMA, COCAINSCRNUR, PCPSCRNUR, PCPQUANT, CANNABQUANT, THCU, [place] List of all Serum Drug Screening Test(s):  No results found for: AMPHSCRSER, BARBSCRSER, BENZOSCRSER, COCAINSCRSER, PCPSCRSER, PCPQUANT, THCSCRSER, CANNABQUANT, OPIATESCRSER, OXYSCRSER, PROPOXSCRSER Historical  Background Evaluation: [place] PDMP: Six (6) year initial data search conducted.             [place]  Department of public safety, offender search: Editor, commissioning Information) Non-contributory Risk Assessment Profile: Aberrant behavior: None observed or detected today Risk factors for fatal opioid overdose: None identified today Fatal overdose hazard ratio (HR): Calculation deferred Non-fatal overdose hazard ratio (HR): Calculation deferred Risk of opioid abuse or dependence: 0.7-3.0% with doses ? 36 MME/day and 6.1-26% with doses ? 120 MME/day. Substance use disorder (SUD) risk level: Pending results of Medical Psychology Evaluation for SUD Opioid risk tool (ORT) (Total Score): 1  ORT Scoring interpretation table:  Score <3 = Low Risk for SUD  Score between 4-7 = Moderate Risk for SUD  Score >8 = High Risk for Opioid Abuse   PHQ-2 Depression Scale:  Total score:    PHQ-2 Scoring interpretation table: (Score and probability of major depressive disorder)  Score 0 = No depression  Score 1 = 15.4% Probability  Score 2 = 21.1% Probability  Score 3 = 38.4% Probability  Score 4 = 45.5% Probability  Score 5 = 56.4% Probability  Score 6 = 78.6% Probability   PHQ-9 Depression Scale:  Total score:    PHQ-9 Scoring interpretation table:  Score 0-4 = No depression  Score 5-9 = Mild depression  Score 10-14 = Moderate depression  Score 15-19 = Moderately severe depression  Score 20-27 = Severe depression (2.4 times higher risk of SUD and 2.89 times higher risk of overuse)   Pharmacologic Plan: Pending ordered tests and/or consults  Meds  The patient has a current medication list which includes the following prescription(s): albuterol, aspirin-acetaminophen-caffeine, canagliflozin, cholecalciferol, gabapentin, glucose blood, hydrocodone-acetaminophen, basaglar kwikpen, insulin pen needle, lidocaine-prilocaine, ondansetron, prochlorperazine, tramadol, and tramadol.  Current Outpatient Medications on File Prior to Visit  Medication Sig  . albuterol (PROVENTIL HFA;VENTOLIN HFA) 108 (90 Base) MCG/ACT  inhaler Inhale 2 puffs into the lungs every 6 (six) hours as needed for wheezing or shortness of breath.  [name] aspirin-acetaminophen-caffeine (EXCEDRIN MIGRAINE) 250-250-65 MG tablet Take 2 tablets by mouth daily as needed for headache.  . canagliflozin (INVOKANA) 300 MG TABS tablet Take 300 mg by mouth daily.  . cholecalciferol (VITAMIN D3) 25 MCG (1000 UT) tablet Take 2,000 Units by mouth 2 (two) times daily.  [name] gabapentin (NEURONTIN) 300 MG capsule Take 1 Capsule (359m) by mouth in the morning, 1 capsule (3067m mid-day, and 3 capsules (900 mg) at bedtime for nerve pain. (Patient taking differently: Take 3 Capsule (30060mby mouth in the morning, 2 capsule (300m90mid-day, and 3 capsules (900 mg) at bedtime for nerve pain.)  . glucose blood (ONE TOUCH ULTRA TEST) test strip Use up to 4 times/day  . HYDROcodone-acetaminophen (NORCO/VICODIN) 5-325 MG tablet Take 1 tablet by mouth every 4 (four) hours as needed for moderate pain.  . Insulin Glargine (BASAGLAR KWIKPEN) 100 UNIT/ML SOPN INJECT 0.24 MLS (24 UNITS TOTAL) INTO THE SKIN AT BEDTIME.  . Insulin Pen Needle 32G X 4 MM MISC 1 Units by Does not apply route every morning. Pen needles  . lidocaine-prilocaine (EMLA) cream Apply to affected area once (Patient taking differently: Apply 1 application topically daily as needed (port access). Ap)  . ondansetron (ZOFRAN) 8 MG tablet Take 1 tablet (8 mg total) by mouth 2 (two) times daily as needed. Start on the third day after chemotherapy.  . prochlorperazine (COMPAZINE) 10 MG tablet Take 1 tablet (10 mg total) by mouth every 6 (six) hours  as needed (Nausea or vomiting).  . traMADol (ULTRAM) 50 MG tablet Take 1 tablet (50 mg total) by mouth every 6 (six) hours as needed for severe pain.  . traMADol (ULTRAM) 50 MG tablet Take by mouth.   No current facility-administered medications on file prior to visit.    Imaging Review   Note: No new results found.        ROS  Cardiovascular History: No reported  cardiovascular signs or symptoms such as High blood pressure, coronary artery disease, abnormal heart rate or rhythm, heart attack, blood thinner therapy or heart weakness and/or failure Pulmonary or Respiratory History: Smoking and Temporary stoppage of breathing during sleep Neurological History: Abnormal skin sensations (Peripheral Neuropathy) Review of Past Neurological Studies: No results found for this or any previous visit. Psychological-Psychiatric History: Depressed Gastrointestinal History: Irregular, infrequent bowel movements (Constipation) Genitourinary History: No reported renal or genitourinary signs or symptoms such as difficulty voiding or producing urine, peeing blood, non-functioning kidney, kidney stones, difficulty emptying the bladder, difficulty controlling the flow of urine, or chronic kidney disease Hematological History: No reported hematological signs or symptoms such as prolonged bleeding, low or poor functioning platelets, bruising or bleeding easily, hereditary bleeding problems, low energy levels due to low hemoglobin or being anemic Endocrine History: High blood sugar requiring insulin (IDDM) Rheumatologic History: No reported rheumatological signs and symptoms such as fatigue, joint pain, tenderness, swelling, redness, heat, stiffness, decreased range of motion, with or without associated rash Musculoskeletal History: Negative for myasthenia gravis, muscular dystrophy, multiple sclerosis or malignant hyperthermia Work History: Unemployed  Allergies  Carolyn Roy is allergic to aspirin.  Laboratory Chemistry  Inflammation Markers No results found for: CRP, ESRSEDRATE (CRP: Acute Phase) (ESR: Chronic Phase) Renal Function Markers Lab Results  Component Value Date   BUN 11 11/23/2017   CREATININE 0.53 11/23/2017   GFRAA >60 11/23/2017   GFRNONAA >60 11/23/2017   Hepatic Function Markers Lab Results  Component Value Date   AST 20 11/23/2017   ALT 27  11/23/2017   ALBUMIN 3.8 11/23/2017   ALKPHOS 78 11/23/2017   Electrolytes Lab Results  Component Value Date   NA 138 11/23/2017   K 3.8 11/23/2017   CL 102 11/23/2017   CALCIUM 9.6 11/23/2017   MG 1.9 11/23/2017   Neuropathy Markers Lab Results  Component Value Date   VITAMINB12 637 12/13/2017   Bone Pathology Markers Lab Results  Component Value Date   ALKPHOS 78 11/23/2017   VD25OH 20.0 (L) 12/13/2017   CALCIUM 9.6 11/23/2017   Coagulation Parameters Lab Results  Component Value Date   PLT 257 11/23/2017   Cardiovascular Markers Lab Results  Component Value Date   HGB 13.0 11/23/2017   HCT 39.2 11/23/2017   Note: Lab results reviewed.  PFSH  Drug: Carolyn Roy  reports no history of drug use. Alcohol:  reports no history of alcohol use. Tobacco:  reports that she has been smoking cigarettes. She has a 5.50 pack-year smoking history. She has quit using smokeless tobacco.  Her smokeless tobacco use included snuff. Medical:  has a past medical history of Cancer ([place]) (06/15/2017), Depression, Diabetes mellitus without complication ([place]) (1761), Endometriosis, Family history of breast cancer, Headache, Hyperlipidemia, Ovarian mass, and Pneumonia. Family: family history includes Breast cancer in an other family member; Breast cancer (age of onset: 80) in her maternal grandmother; Breast cancer (age of onset: 46) in an other family member; Breast cancer (age of onset: 76) in her maternal aunt; Diabetes in her brother; Other  in her father; Pancreatitis in her brother; Prostate cancer (age of onset: 13) in her brother.  Past Surgical History:  Procedure Laterality Date  . AXILLARY LYMPH NODE BIOPSY Left 07/14/2017   Procedure: INSERTION GEL MARK CLIP LEFT AXILLA;  Surgeon: Robert Bellow, MD;  Location: [facility] ORS;  Service: General;  Laterality: Left;  . BREAST BIOPSY Left    Dr Orlene Och BREAST METASTATIC CARCINOMA  . BREAST LUMPECTOMY Left 01/12/2018  . OOPHORECTOMY     . PARTIAL MASTECTOMY WITH NEEDLE LOCALIZATION Left 01/12/2018   Procedure: PARTIAL MASTECTOMY WITH NEEDLE LOCALIZATION;  Surgeon: Robert Bellow, MD;  Location: [facility] ORS;  Service: General;  Laterality: Left;  . PORTACATH PLACEMENT Right 07/14/2017   Procedure: INSERTION PORT-A-CATH;  Surgeon: Robert Bellow, MD;  Location: [facility] ORS;  Service: General;  Laterality: Right;  . SENTINEL NODE BIOPSY Left 01/12/2018   Procedure: SENTINEL NODE BIOPSY;  Surgeon: Robert Bellow, MD;  Location: [facility] ORS;  Service: General;  Laterality: Left;  . TUBAL LIGATION     Active Ambulatory Problems    Diagnosis Date Noted  . Poorly controlled type 2 diabetes mellitus ([place]) 03/30/2015  . Chronic fatigue 03/30/2015  . Severe depression ([place]) 03/30/2015  . Knee pain, left 05/29/2015  . Tobacco abuse 11/15/2016  . Depression, major, single episode, severe ([place]) 11/15/2016  . Hypercholesteremia 03/03/2017  . Stage 1 mild COPD by GOLD classification ([place]) 03/31/2017  . Cancer of midline of breast, left ([place]) 06/15/2017  . Family history of breast cancer   . Cervical polyp 07/05/2017  . Goals of care, counseling/discussion 07/20/2017  . Encounter for antineoplastic chemotherapy 07/20/2017  . Hyperglycemia 07/28/2017  . Chemotherapy-induced neutropenia ([place]) 07/29/2017  . Chemotherapy-induced neuropathy ([place]) 10/19/2017  . Malignant neoplasm of lower-outer quadrant of left breast of female, estrogen receptor positive ([place]) 11/08/2017  . Burning sensation of feet 01/09/2018  . Numbness and tingling in both hands 01/09/2018  . Chronic pain of both feet (Primary Area of Pain) (R>L) 01/22/2018  . Neuropathic pain of both feet 01/22/2018  . Chronic pain of both knees (Secondary Area of Pain) (R>L) 01/22/2018  . Pain in both hands [facility] Area of Pain) (R>L) 01/22/2018  . Chronic pain syndrome 01/22/2018  . Pharmacologic therapy 01/22/2018  . Disorder of skeletal system 01/22/2018  . Problems  influencing health status 01/22/2018  . Chronic upper extremity pain [facility] Area of Pain) (R>L) 01/22/2018  . Chronic pain of both hips (Fourth Area of Pain) (R>L) 01/22/2018   Resolved Ambulatory Problems    Diagnosis Date Noted  . Need for diphtheria-tetanus-pertussis (Tdap) vaccine, adult/adolescent 03/30/2015   Past Medical History:  Diagnosis Date  . Cancer ([place]) 06/15/2017  . Depression   . Diabetes mellitus without complication ([place]) 6759  . Endometriosis   . Headache   . Hyperlipidemia   . Ovarian mass   . Pneumonia    Constitutional Exam  General appearance: Well nourished, well developed, and well hydrated. In no apparent acute distress Vitals:   01/22/18 1036  BP: 107/69  Pulse: 91  Temp: 98 F (36.7 C)  SpO2: 97%  Weight: 176 lb (79.8 kg)  Height: '5\' 3"'  (1.6 m)   BMI Assessment: Estimated body mass index is 31.18 kg/m as calculated from the following:   Height as of this encounter: '5\' 3"'  (1.6 m).   Weight as of this encounter: 176 lb (79.8 kg).  BMI interpretation table: BMI level Category Range association with higher incidence of chronic pain  <18 kg/m2  Underweight   18.5-24.9 kg/m2 Ideal body weight   25-29.9 kg/m2 Overweight Increased incidence by 20%  30-34.9 kg/m2 Obese (Class I) Increased incidence by 68%  35-39.9 kg/m2 Severe obesity (Class II) Increased incidence by 136%  >40 kg/m2 Extreme obesity (Class III) Increased incidence by 254%   BMI Readings from Last 4 Encounters:  01/22/18 31.18 kg/m  01/18/18 31.18 kg/m  01/12/18 31.89 kg/m  12/14/17 29.70 kg/m   Wt Readings from Last 4 Encounters:  01/22/18 176 lb (79.8 kg)  01/18/18 176 lb (79.8 kg)  01/12/18 180 lb (81.6 kg)  12/14/17 184 lb (83.5 kg)  Psych/Mental status: Alert, oriented x 3 (person, place, & time)       Eyes: PERLA Respiratory: No evidence of acute respiratory distress  Cervical Spine Exam  Inspection: No masses, redness, or swelling Alignment:  Symmetrical Functional ROM: Unrestricted ROM      Stability: No instability detected Muscle strength & Tone: Functionally intact Sensory: Unimpaired Palpation: No palpable anomalies              Upper Extremity (UE) Exam    Side: Right upper extremity  Side: Left upper extremity  Inspection: No masses, redness, swelling, or asymmetry. No contractures  Inspection: No masses, redness, swelling, or asymmetry. No contractures  Functional ROM: Unrestricted ROM          Functional ROM: Unrestricted ROM          Muscle strength & Tone: Functionally intact  Muscle strength & Tone: Functionally intact  Sensory: Neuropathic pain pattern  Sensory: Neuropathic pain pattern  Palpation: No palpable anomalies              Palpation: No palpable anomalies              Specialized Test(s): Deferred         Specialized Test(s): Deferred          Thoracic Spine Exam  Inspection: No masses, redness, or swelling Alignment: Symmetrical Functional ROM: Unrestricted ROM Stability: No instability detected Sensory: Unimpaired Muscle strength & Tone: No palpable anomalies  Lumbar Spine Exam  Inspection: No masses, redness, or swelling Alignment: Symmetrical Functional ROM: Unrestricted ROM      Stability: No instability detected Muscle strength & Tone: Functionally intact Sensory: Unimpaired Palpation: No palpable anomalies       Provocative Tests: Lumbar Hyperextension and rotation test: Negative       Patrick's Maneuver: Negative                    Gait & Posture Assessment  Ambulation: Unassisted Gait: Antalgic Posture: WNL   Lower Extremity Exam    Side: Right lower extremity  Side: Left lower extremity  Inspection: Edema  Inspection: No masses, redness, swelling, or asymmetry. No contractures  Functional ROM: Painful adequateROM          Functional ROM: Unrestricted ROM          Muscle strength & Tone: Functionally intact  Muscle strength & Tone: Functionally intact  Sensory: Neuropathic pain  pattern  Sensory: Neuropathic pain pattern  Palpation: Complains of area being tender to palpation  Palpation: No palpable anomalies   Assessment  Primary Diagnosis & Pertinent Problem List: The primary encounter diagnosis was Chronic pain of both feet. Diagnoses of Neuropathic pain of both feet, Chronic pain of both knees (Secondary Area of Pain) (R>L), Pain in both hands [facility] Area of Pain) (R>L), Chronic pain of both hips (Fourth Area of Pain) (R>L), Chronic pain  of both upper extremities, Chronic pain syndrome, Disorder of skeletal system, Pharmacologic therapy, and Problems influencing health status were also pertinent to this visit.  Visit Diagnosis: 1. Chronic pain of both feet   2. Neuropathic pain of both feet   3. Chronic pain of both knees (Secondary Area of Pain) (R>L)   4. Pain in both hands [facility] Area of Pain) (R>L)   5. Chronic pain of both hips (Fourth Area of Pain) (R>L)   6. Chronic pain of both upper extremities   7. Chronic pain syndrome   8. Disorder of skeletal system   9. Pharmacologic therapy   10. Problems influencing health status    Plan of Care  Initial treatment plan:  Please be advised that as per protocol, today's visit has been an evaluation only. We have not taken over the patient's controlled substance management.  Problem-specific plan: No problem-specific Assessment & Plan notes found for this encounter.  Ordered Lab-work, Procedure(s), Referral(s), & Consult(s): Orders Placed This Encounter  Procedures  . DG Knee 1-2 Views Right  . Compliance Drug Analysis, Ur  . Comp. Metabolic Panel (12)  . Sedimentation rate  . C-reactive protein   Pharmacotherapy: Medications ordered:  No orders of the defined types were placed in this encounter.  Medications administered during this visit: Carolyn Roy had no medications administered during this visit.   Pharmacotherapy under consideration:  Opioid Analgesics: The patient was informed  that there is no guarantee that she would be a candidate for opioid analgesics. The decision will be made following CDC guidelines. This decision will be based on the results of diagnostic studies, as well as Carolyn Roy's risk profile.  Membrane stabilizer: To be determined at a later time Muscle relaxant: To be determined at a later time NSAID: To be determined at a later time Other analgesic(s): To be determined at a later time   Interventional therapies under consideration: Carolyn Roy was informed that there is no guarantee that she would be a candidate for interventional therapies. The decision will be based on the results of diagnostic studies, as well as Carolyn Roy's risk profile.  Possible procedure(s): Diagnostic sympathetic nerve block Diagnostic intra-articular right knee injection Diagnostic right knee Hyalgan series   Provider-requested follow-up: Return for 2nd Visit, w/ Dr. Dossie Arbour.  Future Appointments  Date Time Provider [place]  02/01/2018  9:00 AM Bary Castilla, [name], MD [facility] None  02/14/2018  8:30 AM Milinda Pointer, MD [facility] None  02/19/2018  1:00 PM [facility]  02/28/2018 11:30 AM [facility] None    Primary Care Physician: Patient, No Pcp Per Location: [facility] Note by:  Date: 01/22/2018; Time: 3:10 PM  Pain Score Disclaimer: We use the NRS-11 scale. This is a self-reported, subjective measurement of pain severity with only modest accuracy. It is used primarily to identify changes within a particular patient. It must be understood that outpatient pain scales are significantly less accurate that those used for research, where they can be applied under ideal controlled circumstances with minimal exposure to variables. In reality, the score is likely to be a combination of pain intensity and pain affect, where pain affect describes the degree of emotional arousal or changes in action readiness  caused by the sensory experience of pain. Factors such as social and work situation, setting, emotional state, anxiety levels, expectation, and prior pain experience may influence pain perception and show large inter-individual differences that may also be affected by time variables.  Patient instructions provided during this appointment: Patient Instructions   ____________________________________________________________________________________________  Appointment Policy Summary  It is our goal and responsibility to provide the medical community with assistance in the evaluation and management of patients with chronic pain. Unfortunately our resources are limited. Because we do not have an unlimited amount of time, or available appointments, we are required to closely monitor and manage their use. The following rules exist to maximize their use:  Patient's responsibilities: 1. Punctuality:  At what time should I arrive? You should be physically present in our office 30 minutes before your scheduled appointment. Your scheduled appointment is with your assigned healthcare provider. However, it takes 5-10 minutes to be "checked-in", and another 15 minutes for the nurses to do the admission. If you arrive to our office at the time you were given for your appointment, you will end up being at least 20-25 minutes late to your appointment with the provider. 2. Tardiness:  What happens if I arrive only a few minutes after my scheduled appointment time? You will need to reschedule your appointment. The cutoff is your appointment time. This is why it is so important that you arrive at least 30 minutes before that appointment. If you have an appointment scheduled for 10:00 AM and you arrive at 10:01, you will be required to reschedule your appointment.  3. Plan ahead:  Always assume that you will encounter traffic on your way in. Plan for it. If you are dependent on a driver, make sure they understand these  rules and the need to arrive early. 4. Other appointments and responsibilities:  Avoid scheduling any other appointments before or after your pain clinic appointments.  5. Be prepared:  Write down everything that you need to discuss with your healthcare provider and give this information to the admitting nurse. Write down the medications that you will need refilled. Bring your pills and bottles (even the empty ones), to all of your appointments, except for those where a procedure is scheduled. 6. No children or pets:  Find someone to take care of them. It is not appropriate to bring them in. 7. Scheduling changes:  We request "advanced notification" of any changes or cancellations. 8. Advanced notification:  Defined as a time period of more than 24 hours prior to the originally scheduled appointment. This allows for the appointment to be offered to other patients. 9. Rescheduling:  When a visit is rescheduled, it will require the cancellation of the original appointment. For this reason they both fall within the category of "Cancellations".  10. Cancellations:  They require advanced notification. Any cancellation less than 24 hours before the  appointment will be recorded as a "No Show". 11. No Show:  Defined as an unkept appointment where the patient failed to notify or declare to the practice their intention or inability to keep the appointment.  Corrective process for repeat offenders:  1. Tardiness: Three (3) episodes of rescheduling due to late arrivals will be recorded as one (1) "No Show". 2. Cancellation or reschedule: Three (3) cancellations or rescheduling will be recorded as one (1) "No Show". 3. "No Shows": Three (3) "No Shows" within a 12 month period will result in discharge from the  practice. ____________________________________________________________________________________________   ______________________________________________________________________________________________  Specialty Pain Scale  Introduction:  There are significant differences in how pain is reported. The word pain usually refers to physical pain, but it is also a common synonym of suffering. The medical community uses a scale from 0 (zero) to 10 (ten)  to report pain level. Zero (0) is described as "no pain", while ten (10) is described as "the worse pain you can imagine". The problem with this scale is that physical pain is reported along with suffering. Suffering refers to mental pain, or more often yet it refers to any unpleasant feeling, emotion or aversion associated with the perception of harm or threat of harm. It is the psychological component of pain.  Pain Specialists prefer to separate the two components. The pain scale used by this practice is the Verbal Numerical Rating Scale (VNRS-11). This scale is for the physical pain only. DO NOT INCLUDE how your pain psychologically affects you. This scale is for adults 8 years of age and older. It has 11 (eleven) levels. The 1st level is 0/10. This means: "right now, I have no pain". In the context of pain management, it also means: "right now, my physical pain is under control with the current therapy".  General Information:  The scale should reflect your current level of pain. Unless you are specifically asked for the level of your worst pain, or your average pain. If you are asked for one of these two, then it should be understood that it is over the past 24 hours.  Levels 1 (one) through 5 (five) are described below, and can be treated as an outpatient. Ambulatory pain management facilities such as ours are more than adequate to treat these levels. Levels 6 (six) through 10 (ten) are also described below, however, these must be treated as a  hospitalized patient. While levels 6 (six) and 7 (seven) may be evaluated at an urgent care facility, levels 8 (eight) through 10 (ten) constitute medical emergencies and as such, they belong in a hospital's emergency department. When having these levels (as described below), do not come to our office. Our facility is not equipped to manage these levels. Go directly to an urgent care facility or an emergency department to be evaluated.  Definitions:  Activities of Daily Living (ADL): Activities of daily living (ADL or ADLs) is a term used in healthcare to refer to people's daily self-care activities. Health professionals often use a person's ability or inability to perform ADLs as a measurement of their functional status, particularly in regard to people post injury, with disabilities and the elderly. There are two ADL levels: Basic and Instrumental. Basic Activities of Daily Living (BADL  or BADLs) consist of self-care tasks that include: Bathing and showering; personal hygiene and grooming (including brushing/combing/styling hair); dressing; Toilet hygiene (getting to the toilet, cleaning oneself, and getting back up); eating and self-feeding (not including cooking or chewing and swallowing); functional mobility, often referred to as "transferring", as measured by the ability to walk, get in and out of bed, and get into and out of a chair; the broader definition (moving from one place to another while performing activities) is useful for people with different physical abilities who are still able to get around independently. Basic ADLs include the things many people do when they get up in the morning and get ready to go out of the house: get out of bed, go to the toilet, bathe, dress, groom, and eat. On the average, loss of function typically follows a particular order. Hygiene is the first to go, followed by loss of toilet use and locomotion. The last to go is the ability to eat. When there is only one  remaining area in which the person is independent, there is a 62.9% chance that it is eating  and only a 3.5% chance that it is hygiene. Instrumental Activities of Daily Living (IADL or IADLs) are not necessary for fundamental functioning, but they let an individual live independently in a community. IADL consist of tasks that include: cleaning and maintaining the house; home establishment and maintenance; care of others (including selecting and supervising caregivers); care of pets; child rearing; managing money; managing financials (investments, etc.); meal preparation and cleanup; shopping for groceries and necessities; moving within the community; safety procedures and emergency responses; health management and maintenance (taking prescribed medications); and using the telephone or other form of communication.  Instructions:  Most patients tend to report their pain as a combination of two factors, their physical pain and their psychosocial pain. This last one is also known as "suffering" and it is reflection of how physical pain affects you socially and psychologically. From now on, report them separately.  From this point on, when asked to report your pain level, report only your physical pain. Use the following table for reference.  Pain Clinic Pain Levels (0-5/10)  Pain Level Score  Description  No Pain 0   Mild pain 1 Nagging, annoying, but does not interfere with basic activities of daily living (ADL). Patients are able to eat, bathe, get dressed, toileting (being able to get on and off the toilet and perform personal hygiene functions), transfer (move in and out of bed or a chair without assistance), and maintain continence (able to control bladder and bowel functions). Blood pressure and heart rate are unaffected. A normal heart rate for a healthy adult ranges from 60 to 100 bpm (beats per minute).   Mild to moderate pain 2 Noticeable and distracting. Impossible to hide from other people. More  frequent flare-ups. Still possible to adapt and function close to normal. It can be very annoying and may have occasional stronger flare-ups. With discipline, patients may get used to it and adapt.   Moderate pain 3 Interferes significantly with activities of daily living (ADL). It becomes difficult to feed, bathe, get dressed, get on and off the toilet or to perform personal hygiene functions. Difficult to get in and out of bed or a chair without assistance. Very distracting. With effort, it can be ignored when deeply involved in activities.   Moderately severe pain 4 Impossible to ignore for more than a few minutes. With effort, patients may still be able to manage work or participate in some social activities. Very difficult to concentrate. Signs of autonomic nervous system discharge are evident: dilated pupils (mydriasis); mild sweating (diaphoresis); sleep interference. Heart rate becomes elevated (>115 bpm). Diastolic blood pressure (lower number) rises above 100 mmHg. Patients find relief in laying down and not moving.   Severe pain 5 Intense and extremely unpleasant. Associated with frowning face and frequent crying. Pain overwhelms the senses.  Ability to do any activity or maintain social relationships becomes significantly limited. Conversation becomes difficult. Pacing back and forth is common, as getting into a comfortable position is nearly impossible. Pain wakes you up from deep sleep. Physical signs will be obvious: pupillary dilation; increased sweating; goosebumps; brisk reflexes; cold, clammy hands and feet; nausea, vomiting or dry heaves; loss of appetite; significant sleep disturbance with inability to fall asleep or to remain asleep. When persistent, significant weight loss is observed due to the complete loss of appetite and sleep deprivation.  Blood pressure and heart rate becomes significantly elevated. Caution: If elevated blood pressure triggers a pounding headache associated with  blurred vision, then the patient should  immediately seek attention at an urgent or emergency care unit, as these may be signs of an impending stroke.    Emergency Department Pain Levels (6-10/10)  Emergency Room Pain 6 Severely limiting. Requires emergency care and should not be seen or managed at an outpatient pain management facility. Communication becomes difficult and requires great effort. Assistance to reach the emergency department may be required. Facial flushing and profuse sweating along with potentially dangerous increases in heart rate and blood pressure will be evident.   Distressing pain 7 Self-care is very difficult. Assistance is required to transport, or use restroom. Assistance to reach the emergency department will be required. Tasks requiring coordination, such as bathing and getting dressed become very difficult.   Disabling pain 8 Self-care is no longer possible. At this level, pain is disabling. The individual is unable to do even the most "basic" activities such as walking, eating, bathing, dressing, transferring to a bed, or toileting. Fine motor skills are lost. It is difficult to think clearly.   Incapacitating pain 9 Pain becomes incapacitating. Thought processing is no longer possible. Difficult to remember your own name. Control of movement and coordination are lost.   The worst pain imaginable 10 At this level, most patients pass out from pain. When this level is reached, collapse of the autonomic nervous system occurs, leading to a sudden drop in blood pressure and heart rate. This in turn results in a temporary and dramatic drop in blood flow to the brain, leading to a loss of consciousness. Fainting is one of the body's self defense mechanisms. Passing out puts the brain in a calmed state and causes it to shut down for a while, in order to begin the healing process.    Summary: 1. Refer to this scale when providing [place] with your pain level. 2. Be accurate and careful  when reporting your pain level. This will help with your care. 3. Over-reporting your pain level will lead to loss of credibility. 4. Even a level of 1/10 means that there is pain and will be treated at our facility. 5. High, inaccurate reporting will be documented as "Symptom Exaggeration", leading to loss of credibility and suspicions of possible secondary gains such as obtaining more narcotics, or wanting to appear disabled, for fraudulent reasons. 6. Only pain levels of 5 or below will be seen at our facility. 7. Pain levels of 6 and above will be sent to the Emergency Department and the appointment cancelled. ______________________________________________________________________________________________   Consider physical therapy for bilateral leg weakness. Consider vitamin D replacement therapy of 50,000 units weekly for 12 weeks.  Patient to ask oncology about above since she is still under treatment plan.

## 2018-01-22 NOTE — Progress Notes (Signed)
Results were reviewed and found to be: mildly abnormal  No acute injury or pathology identified  Review would suggest interventional pain management techniques may be of benefit 

## 2018-01-23 DIAGNOSIS — Z794 Long term (current) use of insulin: Secondary | ICD-10-CM | POA: Insufficient documentation

## 2018-01-23 DIAGNOSIS — E1165 Type 2 diabetes mellitus with hyperglycemia: Secondary | ICD-10-CM | POA: Insufficient documentation

## 2018-01-23 LAB — COMP. METABOLIC PANEL (12)
AST: 10 IU/L (ref 0–40)
Albumin/Globulin Ratio: 1.8 (ref 1.2–2.2)
Albumin: 4.1 g/dL (ref 3.5–5.5)
Alkaline Phosphatase: 103 IU/L (ref 39–117)
BUN/Creatinine Ratio: 16 (ref 9–23)
BUN: 8 mg/dL (ref 6–24)
Bilirubin Total: 0.4 mg/dL (ref 0.0–1.2)
Calcium: 9.7 mg/dL (ref 8.7–10.2)
Chloride: 97 mmol/L (ref 96–106)
Creatinine, Ser: 0.51 mg/dL — ABNORMAL LOW (ref 0.57–1.00)
GFR calc Af Amer: 130 mL/min/{1.73_m2} (ref >59)
GFR calc non Af Amer: 112 mL/min/{1.73_m2} (ref >59)
Globulin, Total: 2.3 g/dL (ref 1.5–4.5)
Glucose: 307 mg/dL — ABNORMAL HIGH (ref 65–99)
Potassium: 4.2 mmol/L (ref 3.5–5.2)
Sodium: 139 mmol/L (ref 134–144)
Total Protein: 6.4 g/dL (ref 6.0–8.5)

## 2018-01-23 LAB — SEDIMENTATION RATE: Sed Rate: 27 mm/hr (ref 0–40)

## 2018-01-23 LAB — C-REACTIVE PROTEIN: CRP: 7 mg/L (ref 0–10)

## 2018-01-26 LAB — COMPLIANCE DRUG ANALYSIS, UR

## 2018-02-01 ENCOUNTER — Other Ambulatory Visit: Payer: Self-pay

## 2018-02-01 ENCOUNTER — Emergency Department
Admission: EM | Admit: 2018-02-01 | Discharge: 2018-02-02 | Disposition: A | Discharge status: Home / Self Care | Payer: 59 | Attending: Emergency Medicine | Admitting: Emergency Medicine

## 2018-02-01 ENCOUNTER — Encounter: Payer: Self-pay | Admitting: Surgery

## 2018-02-01 ENCOUNTER — Encounter: Payer: Self-pay | Admitting: Emergency Medicine

## 2018-02-01 ENCOUNTER — Ambulatory Visit (INDEPENDENT_AMBULATORY_CARE_PROVIDER_SITE_OTHER): Payer: 59 | Admitting: Surgery

## 2018-02-01 ENCOUNTER — Ambulatory Visit: Payer: Self-pay | Admitting: General Surgery

## 2018-02-01 VITALS — BP 117/82 | HR 106 | Temp 97.3°F | Ht 63.0 in | Wt 178.8 lb

## 2018-02-01 DIAGNOSIS — Z79899 Other long term (current) drug therapy: Secondary | ICD-10-CM | POA: Insufficient documentation

## 2018-02-01 DIAGNOSIS — Y69 Unspecified misadventure during surgical and medical care: Secondary | ICD-10-CM | POA: Insufficient documentation

## 2018-02-01 DIAGNOSIS — Z4889 Encounter for other specified surgical aftercare: Secondary | ICD-10-CM

## 2018-02-01 DIAGNOSIS — T8149XA Infection following a procedure, other surgical site, initial encounter: Secondary | ICD-10-CM

## 2018-02-01 DIAGNOSIS — T8131XA Disruption of external operation (surgical) wound, not elsewhere classified, initial encounter: Secondary | ICD-10-CM | POA: Insufficient documentation

## 2018-02-01 DIAGNOSIS — T8140XA Infection following a procedure, unspecified, initial encounter: Secondary | ICD-10-CM | POA: Insufficient documentation

## 2018-02-01 DIAGNOSIS — J449 Chronic obstructive pulmonary disease, unspecified: Secondary | ICD-10-CM | POA: Insufficient documentation

## 2018-02-01 DIAGNOSIS — C50512 Malignant neoplasm of lower-outer quadrant of left female breast: Secondary | ICD-10-CM | POA: Insufficient documentation

## 2018-02-01 DIAGNOSIS — E119 Type 2 diabetes mellitus without complications: Secondary | ICD-10-CM | POA: Insufficient documentation

## 2018-02-01 DIAGNOSIS — F17210 Nicotine dependence, cigarettes, uncomplicated: Secondary | ICD-10-CM | POA: Diagnosis not present

## 2018-02-01 DIAGNOSIS — Z794 Long term (current) use of insulin: Secondary | ICD-10-CM | POA: Insufficient documentation

## 2018-02-01 DIAGNOSIS — Z170 Estrogen receptor positive status [ER+]: Secondary | ICD-10-CM | POA: Insufficient documentation

## 2018-02-01 DIAGNOSIS — C50812 Malignant neoplasm of overlapping sites of left female breast: Secondary | ICD-10-CM

## 2018-02-01 NOTE — ED Triage Notes (Signed)
Patient ambulatory to triage with steady gait, without difficulty or distress noted; pt reports left partial mastectomy 3wks ago by Dr Fleet Contras; had f/u today which was normal; tonight while in bed, rolled over and felt "gush of fluid" and incision opened up

## 2018-02-01 NOTE — Patient Instructions (Signed)
Patient is to return to the office in 2 weeks.   Call the office with any questions or concerns.

## 2018-02-02 ENCOUNTER — Encounter: Payer: Self-pay | Admitting: Surgery

## 2018-02-02 ENCOUNTER — Ambulatory Visit (INDEPENDENT_AMBULATORY_CARE_PROVIDER_SITE_OTHER): Payer: 59 | Admitting: Surgery

## 2018-02-02 ENCOUNTER — Other Ambulatory Visit: Payer: Self-pay

## 2018-02-02 VITALS — BP 113/78 | HR 90 | Temp 97.3°F | Resp 13 | Ht 67.0 in | Wt 181.0 lb

## 2018-02-02 DIAGNOSIS — Z09 Encounter for follow-up examination after completed treatment for conditions other than malignant neoplasm: Secondary | ICD-10-CM

## 2018-02-02 DIAGNOSIS — T8131XA Disruption of external operation (surgical) wound, not elsewhere classified, initial encounter: Secondary | ICD-10-CM

## 2018-02-02 LAB — CBC
HCT: 43.5 % (ref 36.0–46.0)
Hemoglobin: 14.1 g/dL (ref 12.0–15.0)
MCH: 29.9 pg (ref 26.0–34.0)
MCHC: 32.4 g/dL (ref 30.0–36.0)
MCV: 92.4 fL (ref 80.0–100.0)
Platelets: 261 10*3/uL (ref 150–400)
RBC: 4.71 MIL/uL (ref 3.87–5.11)
RDW: 13.6 % (ref 11.5–15.5)
WBC: 7.2 10*3/uL (ref 4.0–10.5)
nRBC: 0 % (ref 0.0–0.2)

## 2018-02-02 LAB — COMPREHENSIVE METABOLIC PANEL
ALT: 14 U/L (ref 0–44)
AST: 15 U/L (ref 15–41)
Albumin: 3.9 g/dL (ref 3.5–5.0)
Alkaline Phosphatase: 89 U/L (ref 38–126)
Anion gap: 8 (ref 5–15)
BUN: 9 mg/dL (ref 6–20)
CO2: 25 mmol/L (ref 22–32)
Calcium: 9.1 mg/dL (ref 8.9–10.3)
Chloride: 102 mmol/L (ref 98–111)
Creatinine, Ser: 0.46 mg/dL (ref 0.44–1.00)
GFR calc Af Amer: >60 mL/min (ref >60)
GFR calc non Af Amer: >60 mL/min (ref >60)
Glucose, Bld: 337 mg/dL — ABNORMAL HIGH (ref 70–99)
Potassium: 3.8 mmol/L (ref 3.5–5.1)
Sodium: 135 mmol/L (ref 135–145)
Total Bilirubin: 0.5 mg/dL (ref 0.3–1.2)
Total Protein: 7.2 g/dL (ref 6.5–8.1)

## 2018-02-02 MED ORDER — SODIUM CHLORIDE 0.9 % IV SOLN
1.0000 g | Freq: Once | INTRAVENOUS | Status: AC
  Administered 2018-02-02: 1 g via INTRAVENOUS
  Filled 2018-02-02: qty 10

## 2018-02-02 MED ORDER — SULFAMETHOXAZOLE-TRIMETHOPRIM 800-160 MG PO TABS: 1.0000 | ORAL_TABLET | Freq: Two times a day (BID) | ORAL | 0 refills | Status: AC

## 2018-02-02 MED ORDER — SULFAMETHOXAZOLE-TRIMETHOPRIM 800-160 MG PO TABS: 1.0000 | ORAL_TABLET | Freq: Two times a day (BID) | ORAL | 0 refills | Status: DC

## 2018-02-02 MED ORDER — SULFAMETHOXAZOLE-TRIMETHOPRIM 800-160 MG PO TABS
1.0000 | ORAL_TABLET | Freq: Once | ORAL | Status: AC
  Administered 2018-02-02: 1 via ORAL
  Filled 2018-02-02: qty 1

## 2018-02-02 NOTE — Progress Notes (Signed)
Surgical Clinic Progress/Follow-up Note   HPI:  52 y.o. Female presents to clinic for subsequent post-op follow-up 14 Days s/p Left partial mastectomy with tissue transfer and sentinel lymph node biopsy ([name], 01/12/2018). Since earlier post-surgical follow-up 6 Days after surgery, patient reports she has continued to experience Left breast "heaviness" and "sore"ness despite continuing to wear her support bra. She otherwise denies redness around her incision, incisional drainage, nipple discharge, fever/chills, CP, or SOB.  Review of Systems:  Constitutional: denies fever/chills  Respiratory: denies shortness of breath, wheezing  Cardiovascular: denies chest pain, palpitations  Breasts: pain, "heaviness", wound, and nipple discharge as per interval history Skin: Denies any other rashes or skin discolorations except post-surgical wounds as per interval history, no evidence of LUE lymphedema  Vital Signs:  BP 117/82   Pulse (!) 106   Temp (!) 97.3 F (36.3 C) (Temporal)   Ht 5\' 3"  (1.6 m)   Wt 178 lb 12.8 oz (81.1 kg)   SpO2 95%   BMI 31.67 kg/m    Physical Exam:  Constitutional:  -- Normal body habitus  -- Awake, alert, and oriented x3  Pulmonary:  -- No crackles -- Equal breath sounds bilaterally -- Breathing non-labored at rest Cardiovascular:  -- S1, S2 present  -- No pericardial rubs  Breast: -- Left breast hyperemia without any focused peri-incisional erythema -- No focused Left breast peri-incisional erythema, fluctuance, or drainage  Musculoskeletal / Integumentary:  -- Wounds or skin discoloration: None appreciated except post-surgical incisions as described above (Breast) -- Extremities: B/L UE and LE FROM, hands and feet warm, no edema   Imaging: No new pertinent imaging available for review  Assessment:  52 y.o. yo Female with a problem list including...  Patient Active Problem List   Diagnosis Date Noted  . Chronic pain of both feet (Primary Area of  Pain) (R>L) 01/22/2018  . Neuropathic pain of both feet 01/22/2018  . Chronic pain of both knees (Secondary Area of Pain) (R>L) 01/22/2018  . Pain in both hands [facility] Area of Pain) (R>L) 01/22/2018  . Chronic pain syndrome 01/22/2018  . Pharmacologic therapy 01/22/2018  . Disorder of skeletal system 01/22/2018  . Problems influencing health status 01/22/2018  . Chronic upper extremity pain [facility] Area of Pain) (R>L) 01/22/2018  . Chronic pain of both hips (Fourth Area of Pain) (R>L) 01/22/2018  . Burning sensation of feet 01/09/2018  . Numbness and tingling in both hands 01/09/2018  . Malignant neoplasm of lower-outer quadrant of left breast of female, estrogen receptor positive ([place]) 11/08/2017  . Chemotherapy-induced neuropathy ([place]) 10/19/2017  . Chemotherapy-induced neutropenia ([place]) 07/29/2017  . Hyperglycemia 07/28/2017  . Goals of care, counseling/discussion 07/20/2017  . Encounter for antineoplastic chemotherapy 07/20/2017  . Cervical polyp 07/05/2017  . Family history of breast cancer   . Cancer of midline of breast, left ([place]) 06/15/2017  . Stage 1 mild COPD by GOLD classification ([place]) 03/31/2017  . Hypercholesteremia 03/03/2017  . Tobacco abuse 11/15/2016  . Depression, major, single episode, severe ([place]) 11/15/2016  . Knee pain, left 05/29/2015  . Poorly controlled type 2 diabetes mellitus ([place]) 03/30/2015  . Chronic fatigue 03/30/2015  . Severe depression ([place]) 03/30/2015    presents to clinic for subsequent post-op follow-up evaluation, essentially unchanged with Left breast heaviness and soreness, possible seroma without evidence of infection 14 Days s/p Left partial mastectomy with tissue transfer and sentinel lymph node biopsy ([name], 01/12/2018).  Plan:              -  continue to wear support bra until "heaviness" resolves             - okay to maintain upcoming radiation oncology assessment             - apply sunblock particularly to incisions with sun  exposure to reduce pigmentation of scars             - return to clinic in another 2 weeks, instructed to call office if any questions or concerns  All of the above recommendations were discussed with the patient, and all of patient's questions were answered to her expressed satisfaction.  -- Marilynne Drivers Rosana Hoes, MD, [place]: [facility] - Partnering for exceptional care. Office: [phone]

## 2018-02-02 NOTE — Progress Notes (Signed)
02/02/2018  HPI: Carolyn Roy is a 52 y.o. female s/p left breast wire localized lumpectomy with tissue transfer and sentinel node biopsy with Dr. Bary Castilla on 01/12/18.  She was seen post-op by Dr. Bary Castilla on 12/12 and had been doing well.  She presented to the clinic again on 12/26 and saw Dr. Rosana Hoes regarding concerns for her left breast wound, particularly swelling of the breast and redness of the skin.  She presented again to the ED the night of 12/26 and was seen by Dr. Owens Shark.  At that point, she had some cellulitis around the left breast incision, with dehiscence of a portion of the skin incision, revealing yellow fluid drainage.  She was given a dose of IV antibiotic in the ED after discussing with me (I was on call that night), given prescription for Bactrim, and sent for follow up today with me. She had normal labs in ED, with WBC of 7.2.  Patient reports that overall, the night of 12/26 she felt a lot of fluid drained out of the wound.  The wound had not opened when she saw Dr. Rosana Hoes, but she reports feeling the swelling of the breast and fullness.  She had not been wearing any compressive bra.  Denies any fevers at home.  Vital signs: BP 113/78   Pulse 90   Temp (!) 97.3 F (36.3 C) (Skin)   Resp 13   Ht 5\' 7"  (1.702 m)   Wt 181 lb (82.1 kg)   SpO2 98%   BMI 28.35 kg/m    Physical Exam: Constitutional:  No acute distress Breast:  Left breast incision in the outer lower quadrant is opened at the mid portion of the wound for approximately 3 cm long, 1 cm wide, and 3 mm deep.  There was some fibrinous material at the wound edges which were sharply debrided at bedside to healthier breast tissue underneath.  No purulence, but the yellow fluid described was liquified fat.  There is mild erythema of the breast surrounding the incision but no significant induration.  Dressed with wet to dry gauze.  Assessment/Plan: This is a 53 y.o. female s/p left breast wire localized lumpectomy and  sentinel node biopsy.  I suspect that after her removing any compressing bra or breast binder, that she developed a seroma in the cavity and now has spontaneously drained after creating enough pressure on the wound to create a dehiscence.  There is no purulence and does not appear infected.  Her WBC was normal in the ED.  However, as a precaution given the open wound and drainage, would err on the side of caution and she should continue the Bactrim prescription given by the ED until course completed.  Instructed the patient on wet to dry dressing changes that she should do once daily.  She should resume wearing the breast binder to help collapse the cavity and decrease drainage.  She will follow up with Dr. Celine Ahr on 1/2, as I will be out of town next week.  She is due to see Dr. Baruch Gouty on 1/14 for mapping for radiation.  I will send Dr. Baruch Gouty a message to see if he would rather postpone that appointment.   Melvyn Neth, [facility]

## 2018-02-02 NOTE — ED Provider Notes (Signed)
[facility] Emergency Department Provider Note ____   First MD Initiated Contact with Patient 02/01/18 2346     (approximate)  I have reviewed the triage vital signs and the nursing notes.   HISTORY  Chief Complaint Post-op Problem    HPI Carolyn Roy is a 52 y.o. female with below list of chronic medical conditions including left breast cancer for which the patient recently had a partial mastectomy performed on 01/12/2018 by Dr. Bary Castilla presents to the emergency department with noted redness of the skin of the left breast with abrupt gush of "yellow" fluid and opening of her surgical incision tonight.  Patient states that she was seen in the office today by Dr. Rosana Hoes secondary swelling and forementioned skin changes.  Patient denies any fever no nausea or vomiting.   Past Medical History:  Diagnosis Date  . Cancer ([place]) 06/15/2017   5.1 cm, T3,N1 (clinical): ER/ PR positive, Her 2 neu not overexpressed, High Ki 67. Neuoadjuvant chemotherapy.   . Depression   . Diabetes mellitus without complication ([place]) 7412  . Endometriosis   . Family history of breast cancer   . Headache    migraines  . Hyperlipidemia   . Ovarian mass   . Pneumonia    2018    Patient Active Problem List   Diagnosis Date Noted  . Chronic pain of both feet (Primary Area of Pain) (R>L) 01/22/2018  . Neuropathic pain of both feet 01/22/2018  . Chronic pain of both knees (Secondary Area of Pain) (R>L) 01/22/2018  . Pain in both hands [facility] Area of Pain) (R>L) 01/22/2018  . Chronic pain syndrome 01/22/2018  . Pharmacologic therapy 01/22/2018  . Disorder of skeletal system 01/22/2018  . Problems influencing health status 01/22/2018  . Chronic upper extremity pain [facility] Area of Pain) (R>L) 01/22/2018  . Chronic pain of both hips (Fourth Area of Pain) (R>L) 01/22/2018  . Burning sensation of feet 01/09/2018  . Numbness and tingling in both hands 01/09/2018  .  Malignant neoplasm of lower-outer quadrant of left breast of female, estrogen receptor positive ([place]) 11/08/2017  . Chemotherapy-induced neuropathy ([place]) 10/19/2017  . Chemotherapy-induced neutropenia ([place]) 07/29/2017  . Hyperglycemia 07/28/2017  . Goals of care, counseling/discussion 07/20/2017  . Encounter for antineoplastic chemotherapy 07/20/2017  . Cervical polyp 07/05/2017  . Family history of breast cancer   . Cancer of midline of breast, left ([place]) 06/15/2017  . Stage 1 mild COPD by GOLD classification ([place]) 03/31/2017  . Hypercholesteremia 03/03/2017  . Tobacco abuse 11/15/2016  . Depression, major, single episode, severe ([place]) 11/15/2016  . Knee pain, left 05/29/2015  . Poorly controlled type 2 diabetes mellitus ([place]) 03/30/2015  . Chronic fatigue 03/30/2015  . Severe depression ([place]) 03/30/2015    Past Surgical History:  Procedure Laterality Date  . AXILLARY LYMPH NODE BIOPSY Left 07/14/2017   Procedure: INSERTION GEL MARK CLIP LEFT AXILLA;  Surgeon: Robert Bellow, MD;  Location: [facility] ORS;  Service: General;  Laterality: Left;  . BREAST BIOPSY Left    Dr Orlene Och BREAST METASTATIC CARCINOMA  . BREAST LUMPECTOMY Left 01/12/2018  . OOPHORECTOMY    . PARTIAL MASTECTOMY WITH NEEDLE LOCALIZATION Left 01/12/2018   Procedure: PARTIAL MASTECTOMY WITH NEEDLE LOCALIZATION;  Surgeon: Robert Bellow, MD;  Location: [facility] ORS;  Service: General;  Laterality: Left;  . PORTACATH PLACEMENT Right 07/14/2017   Procedure: INSERTION PORT-A-CATH;  Surgeon: Robert Bellow, MD;  Location: [facility] ORS;  Service: General;  Laterality: Right;  .  SENTINEL NODE BIOPSY Left 01/12/2018   Procedure: SENTINEL NODE BIOPSY;  Surgeon: Robert Bellow, MD;  Location: [facility] ORS;  Service: General;  Laterality: Left;  . TUBAL LIGATION      Prior to Admission medications   Medication Sig Start Date End Date Taking? Authorizing Provider  albuterol (PROVENTIL HFA;VENTOLIN HFA) 108 (90 Base) MCG/ACT  inhaler Inhale 2 puffs into the lungs every 6 (six) hours as needed for wheezing or shortness of breath. 03/31/17   Kathrine Haddock, NP  aspirin-acetaminophen-caffeine (EXCEDRIN MIGRAINE) [phone] MG tablet Take 2 tablets by mouth daily as needed for headache.    [provider]  Blood Glucose Monitoring Suppl (FIFTY50 GLUCOSE METER 2.0) w/Device KIT Use as directed 01/23/18 01/23/19  [provider]  canagliflozin (INVOKANA) 300 MG TABS tablet Take 300 mg by mouth daily.    [provider]  cholecalciferol (VITAMIN D3) 25 MCG (1000 UT) tablet Take 2,000 Units by mouth 2 (two) times daily.    [provider]  gabapentin (NEURONTIN) 300 MG capsule Take 1 Capsule (334m) by mouth in the morning, 1 capsule (3054m mid-day, and 3 capsules (900 mg) at bedtime for nerve pain. Patient taking differently: Take 3 Capsule (300105mby mouth in the morning, 2 capsule (300m37mid-day, and 3 capsules (900 mg) at bedtime for nerve pain. 12/13/17   AlleVerlon Au  glucose blood (ONE TOUCH ULTRA TEST) test strip Use up to 4 times/day 06/01/15   WickKathrine Haddock  HYDROcodone-acetaminophen (NORCO/VICODIN) 5-325 MG tablet Take 1 tablet by mouth every 4 (four) hours as needed for moderate pain. 01/12/18 01/12/19  ByrnRobert Bellow  Insulin Glargine (BASAGLAR KWIKPEN) 100 UNIT/ML SOPN INJECT 0.24 MLS (24 UNITS TOTAL) INTO THE SKIN AT BEDTIME. 06/23/17   WickKathrine Haddock  Insulin Pen Needle 32G X 4 MM MISC 1 Units by Does not apply route every morning. Pen needles 03/03/17   WickKathrine Haddock  lidocaine-prilocaine (EMLA) cream Apply to affected area once Patient taking differently: Apply 1 application topically daily as needed (port access). Ap 09/21/17   GrayKaren Kitchens  ondansetron (ZOFRAN) 8 MG tablet Take 1 tablet (8 mg total) by mouth 2 (two) times daily as needed. Start on the third day after chemotherapy. 07/04/17   CorcLequita Asal  prochlorperazine (COMPAZINE) 10 MG  tablet Take 1 tablet (10 mg total) by mouth every 6 (six) hours as needed (Nausea or vomiting). 07/04/17   CorcLequita Asal  traMADol (ULTRAM) 50 MG tablet Take 1 tablet (50 mg total) by mouth every 6 (six) hours as needed for severe pain. 12/13/17   AlleVerlon Au  traMADol (ULTVeatrice Bourbon MG tablet Take by mouth. 12/28/17   [provider]    Allergies Aspirin  Family History  Problem Relation Age of Onset  . Other Father        No info about father or paternal relatives  . Diabetes Brother   . Pancreatitis Brother   . Prostate cancer Brother 46  12   currently 47 /7aternal half-brother  . Breast cancer Maternal Grandmother 40       deceased 40s 65sBreast cancer Maternal Aunt 65       currently 72  32Breast cancer Other 45  46   mother's sister; deceased 85  55Breast cancer Other        mother's sister; age at dx unknown    Social History Social  History   Tobacco Use  . Smoking status: Current Every Day Smoker    Packs/day: 0.50    Years: 11.00    Pack years: 5.50    Types: Cigarettes  . Smokeless tobacco: Former Systems developer    Types: Snuff  Substance Use Topics  . Alcohol use: No    Alcohol/week: 0.0 standard drinks  . Drug use: No    Review of Systems Constitutional: No fever/chills Eyes: No visual changes. ENT: No sore throat. Cardiovascular: Denies chest pain. Respiratory: Denies shortness of breath. Gastrointestinal: No abdominal pain.  No nausea, no vomiting.  No diarrhea.  No constipation. Genitourinary: Negative for dysuria. Musculoskeletal: Negative for neck pain.  Negative for back pain. Integumentary: Left breast erythema opening of the surgical wound with yellow drainage Neurological: Negative for headaches, focal weakness or numbness.  ____________________________________________   PHYSICAL EXAM:  VITAL SIGNS: ED Triage Vitals  Enc Vitals Group     BP 02/01/18 2342 (!) 122/96     Pulse Rate 02/01/18 2342 (!) 106     Resp  02/01/18 2342 18     Temp 02/01/18 2342 97.6 F (36.4 C)     Temp Source 02/01/18 2342 Oral     SpO2 02/01/18 2342 96 %     Weight 02/01/18 2336 81 kg (178 lb 9.2 oz)     Height 02/01/18 2336 1.6 m ('5\' 3"' )     Head Circumference --      Peak Flow --      Pain Score 02/01/18 2334 4     Pain Loc --      Pain Edu? --      Excl. in [place]? --     Constitutional: Alert and oriented. Well appearing and in no acute distress. Eyes: Conjunctivae are normal. PERRL. EOMI. Mouth/Throat: Mucous membranes are moist.  Oropharynx non-erythematous. Neck: No stridor.   Chest/breast: Erythema extending around the left breast surgical wound with 1 inch area of dehiscence of the surgical wound with nonodorous yellow drainage. Cardiovascular: Normal rate, regular rhythm. Good peripheral circulation. Grossly normal heart sounds. Respiratory: Normal respiratory effort.  No retractions. Lungs CTAB. Gastrointestinal: Soft and nontender. No distention.  Musculoskeletal: No lower extremity tenderness nor edema. No gross deformities of extremities. Neurologic:  Normal speech and language. No gross focal neurologic deficits are appreciated.  Skin: Refer to chest findings above Psychiatric: Mood and affect are normal. Speech and behavior are normal.  ____________________________________________   LABS (all labs ordered are listed, but only abnormal results are displayed)  Labs Reviewed  COMPREHENSIVE METABOLIC PANEL - Abnormal; Notable for the following components:      Result Value   Glucose, Bld 337 (*)    All other components within normal limits  AEROBIC CULTURE (SUPERFICIAL SPECIMEN)  CBC     Procedures   ____________________________________________   INITIAL IMPRESSION / ASSESSMENT AND PLAN / ED COURSE  As part of my medical decision making, I reviewed the following data within the electronic MEDICAL RECORD NUMBER  52 year old female presenting with above-stated history and physical exam secondary  to dehiscence of surgical wound with yellow drainage overlying skin findings as above consistent with possible cellulitis.  Patient discussed with Dr. Hampton Abbot general surgeon on-call evaluated images of the patient's wound and skin findings with recommendation for dose of IV antibiotics now and prescription for IV antibiotics at home with outpatient follow-up with general surgery today.  Wound culture was obtained and pending at this time.  Patient received 1 g IV ceftriaxone as well  as Bactrim DS and will be prescribed Bactrim DS for home.    ____________________________________________  FINAL CLINICAL IMPRESSION(S) / ED DIAGNOSES  Final diagnoses:  Wound infection after surgery     MEDICATIONS GIVEN DURING THIS VISIT:  Medications  cefTRIAXone (ROCEPHIN) 1 g in sodium chloride 0.9 % 100 mL IVPB (has no administration in time range)  sulfamethoxazole-trimethoprim (BACTRIM DS,SEPTRA DS) 800-160 MG per tablet 1 tablet (1 tablet Oral Given 02/02/18 0039)     ED Discharge Orders    None       Note:  This document was prepared using Dragon voice recognition software and may include unintentional dictation errors.    Gregor Hams, MD 02/02/18 [phone]

## 2018-02-02 NOTE — Patient Instructions (Addendum)
Return in one week  With Dr. Celine Ahr in [place]. Check bandage once daily.   The patient is aware to call back for any questions or concerns.

## 2018-02-03 ENCOUNTER — Encounter: Payer: Self-pay | Admitting: Surgery

## 2018-02-05 LAB — AEROBIC CULTURE W GRAM STAIN (SUPERFICIAL SPECIMEN): Culture: NORMAL

## 2018-02-08 ENCOUNTER — Ambulatory Visit (INDEPENDENT_AMBULATORY_CARE_PROVIDER_SITE_OTHER): Payer: 59 | Admitting: General Surgery

## 2018-02-08 ENCOUNTER — Other Ambulatory Visit: Payer: Self-pay

## 2018-02-08 ENCOUNTER — Encounter: Payer: Self-pay | Admitting: General Surgery

## 2018-02-08 VITALS — BP 115/78 | HR 83 | Temp 96.7°F | Resp 16 | Ht 63.0 in | Wt 178.5 lb

## 2018-02-08 DIAGNOSIS — T8130XD Disruption of wound, unspecified, subsequent encounter: Secondary | ICD-10-CM

## 2018-02-08 DIAGNOSIS — Z9889 Other specified postprocedural states: Secondary | ICD-10-CM

## 2018-02-08 NOTE — Patient Instructions (Signed)
Please continue to wash the area with soap and water after removing old packing.  after showering please replace new packing to the wound daily.  We have provided supplies today.  Please wear a sports bra daily only removing when you shower.   Please see your follow up appointment listed below.

## 2018-02-08 NOTE — Progress Notes (Signed)
HPI: Carolyn Roy is a 53 y.o. female s/p left breast wire localized lumpectomy with tissue transfer and sentinel node biopsy with Dr. Bary Castilla on 01/12/18.  She was seen post-op by Dr. Bary Castilla on 12/12 and had been doing well.  She presented to the clinic again on 12/26 and saw Dr. Rosana Hoes regarding concerns for her left breast wound, particularly swelling of the breast and redness of the skin.  She presented again to the ED the night of 12/26 and was seen by Dr. Owens Shark.  At that point, she had some cellulitis around the left breast incision, with dehiscence of a portion of the skin incision, revealing yellow fluid drainage.  She was given a dose of IV antibiotic in the ED after discussing with Dr. Hampton Abbot, who was on call that night, given prescription for Bactrim, and sent for follow up today with me. She had normal labs in ED, with WBC of 7.2.  She was seen by Dr. Hampton Abbot on 02 February 2018.  The wound was opened and fibrinous material was sharply debrided.  The wound was then dressed with wet-to-dry gauze.  She is here today for reevaluation of her incision.  She states that she continues to have a fair amount of clear yellow drainage.  Her significant other is performing daily dressing changes as recommended.  They state that the redness surrounding the incision has markedly improved.  She denies fevers or chills.  Vitals:   02/08/18 1005  BP: 115/78  Pulse: 83  Resp: 16  Temp: (!) 96.7 F (35.9 C)  SpO2: 93%   Gen: A&O x 3. NAD Focused wound exam: I removed the packing material.  This was somewhat thick, coarse, paperlike material.  There was minimal drainage on the outer gauze.  There was no foul odor nor purulent drainage.  There was a small amount of fibrinous exudate still present.  I further debrided the fibrinous exudate.  I then repacked the wound with saline moistened packing strips.  There is about 1/2 cm of tunneling towards the midline of the wound.  There is still good beefy  granulation tissue present.  Assessment and plan: This is a 53 year old woman status post left breast wire localized lumpectomy and sentinel lymph node biopsy.  She appears to have developed a seroma in the postoperative period that ultimately continuously drained.  She and her significant other are performing local wound care.  I have recommended that she continue this and reiterated the usefulness of a compressive bra.  She is willing to wear a sports bra; she did not care for the breast support provided in hospital.  We will plan to see her again in clinic in about 1 week to reassess the wound.  Dr. Mont Dutton note mentions that he will contact Dr. Baruch Gouty regarding radiation simulation.  I suspect her treatment will need to be delayed until the wound is closed however she may still be able to proceed with simulation and mapping.

## 2018-02-11 NOTE — Progress Notes (Signed)
Patient's Name: Carolyn Roy  MRN: [id number]  Referring Provider: Kathrine Haddock, NP  DOB: [birth year]  PCP: Patient, No Pcp Per  DOS: 02/14/2018  Note by: Gaspar Cola, MD  Service setting: Ambulatory outpatient  Specialty: Interventional Pain Management  Location: [facility] (AMB) Pain Management Facility    Patient type: Established   Primary Reason(s) for Visit: Encounter for evaluation before starting new chronic pain management plan of care (Level of risk: moderate) CC: Follow-up ("Pain all over" ) and Leg Pain  HPI  [name] is a 53 y.o. year old, female patient, who comes today for a follow-up evaluation to review the test results and decide on a treatment plan. She has Poorly controlled type 2 diabetes mellitus ([place]); Chronic fatigue; Severe depression ([place]); Knee pain, left; Tobacco abuse; Depression, major, single episode, severe ([place]); Hypercholesterolemia; Stage 1 mild COPD by GOLD classification ([place]); Cancer of midline of breast, left ([place]); Family history of breast cancer; Cervical polyp; Goals of care, counseling/discussion; Encounter for antineoplastic chemotherapy; Hyperglycemia; Chemotherapy-induced neutropenia ([place]); Chemotherapy-induced neuropathy ([place]); Malignant neoplasm of lower-outer quadrant of left breast of female, estrogen receptor positive ([place]); Burning sensation of feet (Bilateral); Numbness and tingling in hands (Bilateral); Chronic feet pain (Primary Area of Pain) (Bilateral) (R>L); Neuropathic pain of feet (Bilateral); Chronic knee pain (Secondary Area of Pain) (Bilateral) (R>L); Chronic hand pain (Tertiary Area of Pain) (Bilateral) (R>L); Chronic pain syndrome; Pharmacologic therapy; Disorder of skeletal system; Problems influencing health status; Chronic hip pain (Fourth Area of Pain) (Bilateral) (R>L); Type 2 diabetes mellitus with hyperglycemia, with long-term current use of insulin ([place]); Chronic upper extremity pain (Tertiary Area of Pain) (Bilateral) (R>L);  Cancer-related pain; Vitamin D deficiency; Neuropathic pain; and Osteoarthritis of right knee on their problem list. Her primarily concern today is the Follow-up ("Pain all over" ) and Leg Pain  Pain Assessment: Location:   Other (Comment)(muscles, joints) Radiating: denies Onset: More than a month ago Duration: Chronic pain Quality: Aching, Dull Severity: 4 /10 (subjective, self-reported pain score)  Note: Reported level is compatible with observation.                         When using our objective Pain Scale, levels between 6 and 10/10 are said to belong in an emergency room, as it progressively worsens from a 6/10, described as severely limiting, requiring emergency care not usually available at an outpatient pain management facility. At a 6/10 level, communication becomes difficult and requires great effort. Assistance to reach the emergency department may be required. Facial flushing and profuse sweating along with potentially dangerous increases in heart rate and blood pressure will be evident. Timing: Constant Modifying factors: nothing BP: 118/85  HR: 81  Carolyn Roy comes in today for a follow-up visit after her initial evaluation on 01/22/2018. Today we went over the results of her tests. These were explained in "Layman's terms". During today's appointment we went over my diagnostic impression, as well as the proposed treatment plan.  According to the patient her primary area of pain is in her feet.  She has numbness, tingling burning and stabbing pain.  She admits that this is secondary to chemotherapy treatment of Taxol.  She has just completed chemotherapy and is getting ready to start radiation for breast cancer left.  She also has a history of diabetes.  She is status post EMG.  She is currently using gabapentin.  She denies any recent physical therapy.  Her secondary her pain is in  her knees (B) (R>L). She admits the pain goes down her shin into her feet.  She describes as a sharp  pain.  She also feels like there is heaviness in her lower extremities.  She denies any previous surgery, interventional therapy or physical therapy for her knees.  She denies any recent images.  Her next area of pain is in her arms (B) (L>R). She admits that the pain starts in the elbow and goes down into her hands. She has numbness, swelling and weakness.  Her last area of pain is in her hips (L).  In considering the treatment plan options, Carolyn Roy was reminded that I no longer take patients for medication management only. I asked her to let me know if she had no intention of taking advantage of the interventional therapies, so that we could make arrangements to provide this space to someone interested. I also made it clear that undergoing interventional therapies for the purpose of getting pain medications is very inappropriate on the part of a patient, and it will not be tolerated in this practice. This type of behavior would suggest true addiction and therefore it requires referral to an addiction specialist.   Further details on both, my assessment(s), as well as the proposed treatment plan, please see below.  Controlled Substance Pharmacotherapy Assessment REMS (Risk Evaluation and Mitigation Strategy)  Analgesic: Hydrocodone/acetaminophen 5/325 mg, 1 tablet 5 times daily (fill date 01/12/2018) hydrocodone 25 mg/day Highest recorded MME/day: 25 mg/day MME/day: 25 mg/day  Pill Count: None expected due to no prior prescriptions written by our practice. No notes on file Pharmacokinetics: Liberation and absorption (onset of action): WNL Distribution (time to peak effect): WNL Metabolism and excretion (duration of action): WNL         Pharmacodynamics: Desired effects: Analgesia: [name] reports >50% benefit. Functional ability: Patient reports that medication allows her to accomplish basic ADLs Clinically meaningful improvement in function (CMIF): Sustained CMIF goals met Perceived  effectiveness: Described as relatively effective, allowing for increase in activities of daily living (ADL) Undesirable effects: Side-effects or Adverse reactions: None reported Monitoring: [place] PMP: Online review of the past 76-monthperiod previously conducted. Not applicable at this point since we have not taken over the patient's medication management yet. List of other Serum/Urine Drug Screening Test(s):  No results found. List of all UDS test(s) done:  Lab Results  Component Value Date   SUMMARY FINAL 01/22/2018   Last UDS on record: Summary  Date Value Ref Range Status  01/22/2018 FINAL  Final    Comment:    ==================================================================== TOXASSURE COMP DRUG ANALYSIS,UR ==================================================================== Test                             Result       Flag       Units Drug Present and Declared for Prescription Verification   Norhydrocodone                 255          EXPECTED   ng/mg creat    Norhydrocodone is an expected metabolite of hydrocodone.   Tramadol                       5912         EXPECTED   ng/mg creat   O-Desmethyltramadol            7009  EXPECTED   ng/mg creat   N-Desmethyltramadol            1209         EXPECTED   ng/mg creat    Source of tramadol is a prescription medication.    O-desmethyltramadol and N-desmethyltramadol are expected    metabolites of tramadol.   Gabapentin                     PRESENT      EXPECTED   Acetaminophen                  PRESENT      EXPECTED Drug Present not Declared for Prescription Verification   Alcohol, Ethyl                 0.242        UNEXPECTED g/dL    Sources of ethyl alcohol include alcoholic beverages or as a    fermentation product of glucose; glucose is present in this    specimen.  Interpret result with caution, as the presence of    ethyl alcohol is likely due, at least in part, to fermentation of    glucose.   Naproxen                        PRESENT      UNEXPECTED Drug Absent but Declared for Prescription Verification   Hydrocodone                    Not Detected UNEXPECTED ng/mg creat    Hydrocodone is almost always present in patients taking this drug    consistently. Absence of hydrocodone could be due to lapse of    time since the last dose or unusual pharmacokinetics (rapid    metabolism).   Prochlorperazine               Not Detected UNEXPECTED   Salicylate                     Not Detected UNEXPECTED    Aspirin, as indicated in the declared medication list, is not    always detected even when used as directed.   Lidocaine                      Not Detected UNEXPECTED    Lidocaine, as indicated in the declared medication list, is not    always detected even when used as directed. ==================================================================== Test                      Result    Flag   Units      Ref Range   Creatinine              33               mg/dL      >=20 ==================================================================== Declared Medications:  The flagging and interpretation on this report are based on the  following declared medications.  Unexpected results may arise from  inaccuracies in the declared medications.  **Note: The testing scope of this panel includes these medications:  Gabapentin (Neurontin)  Hydrocodone (Norco)  Prochlorperazine (Compazine)  Tramadol (Ultram)  **Note: The testing scope of this panel does not include small to  moderate amounts of these reported medications:  Acetaminophen (Excedrin)  Acetaminophen (Norco)  Aspirin (Excedrin)  Lidocaine (Lidocaine-Prilocaine)  **Note:  The testing scope of this panel does not include following  reported medications:  Albuterol  Caffeine (Excedrin)  Canagliflozin (Invokana)  Insulin  Ondansetron (Zofran)  Prilocaine (Lidocaine-Prilocaine)  Vitamin D3 ==================================================================== For  clinical consultation, please call [phone]. ====================================================================    UDS interpretation: Unexpected findings not considered significantly abnormal.          Medication Assessment Form: Patient introduced to form today Treatment compliance: Treatment may start today if patient agrees with proposed plan. Evaluation of compliance is not applicable at this point Risk Assessment Profile: Aberrant behavior: See initial evaluations. None observed or detected today Comorbid factors increasing risk of overdose: See initial evaluation. No additional risks detected today Opioid risk tool (ORT) (Total Score): 0 Personal History of Substance Abuse (SUD-Substance use disorder):  Alcohol: Negative  Illegal Drugs: Negative  Rx Drugs: Negative  ORT Risk Level calculation: Low Risk Risk of substance use disorder (SUD): Low Opioid Risk Tool - 02/14/18 0858      Family History of Substance Abuse   Alcohol  Negative    Illegal Drugs  Negative    Rx Drugs  Negative      Personal History of Substance Abuse   Alcohol  Negative    Illegal Drugs  Negative    Rx Drugs  Negative      Age   Age between 57-45 years   No      History of Preadolescent Sexual Abuse   History of Preadolescent Sexual Abuse  Negative or Female      Psychological Disease   Psychological Disease  Negative    Depression  Negative      Total Score   Opioid Risk Tool Scoring  0    Opioid Risk Interpretation  Low Risk      ORT Scoring interpretation table:  Score <3 = Low Risk for SUD  Score between 4-7 = Moderate Risk for SUD  Score >8 = High Risk for Opioid Abuse   Risk Mitigation Strategies:  Patient opioid safety counseling: Completed today. Counseling provided to patient as per "Patient Counseling Document". Document signed by patient, attesting to counseling and understanding Patient-Prescriber Agreement (PPA): Obtained today.  Controlled substance notification to  other providers: Written and sent today.  Pharmacologic Plan: Today we may be taking over the patient's pharmacological regimen. See below.             Laboratory Chemistry  Inflammation Markers (CRP: Acute Phase) (ESR: Chronic Phase) Lab Results  Component Value Date   CRP 7 01/22/2018   ESRSEDRATE 27 01/22/2018                         Rheumatology Markers Lab Results  Component Value Date   LABURIC 3.3 03/30/2015                        Renal Function Markers Lab Results  Component Value Date   BUN 9 02/02/2018   CREATININE 0.46 02/02/2018   BCR 16 01/22/2018   GFRAA >60 02/02/2018   GFRNONAA >60 02/02/2018                             Hepatic Function Markers Lab Results  Component Value Date   AST 15 02/02/2018   ALT 14 02/02/2018   ALBUMIN 3.9 02/02/2018   ALKPHOS 89 02/02/2018  Electrolytes Lab Results  Component Value Date   NA 135 02/02/2018   K 3.8 02/02/2018   CL 102 02/02/2018   CALCIUM 9.1 02/02/2018   MG 1.9 11/23/2017                        Neuropathy Markers Lab Results  Component Value Date   VITAMINB12 637 12/13/2017   FOLATE 20.5 12/13/2017   HGBA1C 8.8 (H) 06/02/2017   HIV Non Reactive 07/01/2015                        CNS Tests No results found.  Bone Pathology Markers Lab Results  Component Value Date   VD25OH 20.0 (L) 12/13/2017                         Coagulation Parameters Lab Results  Component Value Date   PLT 261 02/02/2018                        Cardiovascular Markers Lab Results  Component Value Date   HGB 14.1 02/02/2018   HCT 43.5 02/02/2018                         CA Markers No results found.  Note: Lab results reviewed.  Recent Diagnostic Imaging Review  Knee Imaging: Knee-R DG 1-2 views:  Results for orders placed during the hospital encounter of 01/22/18  DG Knee 1-2 Views Right   Narrative CLINICAL DATA:  53 year old female with a history of right knee pain  EXAM: RIGHT  KNEE - 1-2 VIEW  COMPARISON:  None.  FINDINGS: No acute displaced fracture. No joint effusion. Mild degenerative changes of the mediolateral compartment and the patellofemoral compartment. No radiopaque foreign body. Questionable prepatellar soft tissue swelling  IMPRESSION: Negative for acute bony abnormality.  Questionable prepatellar soft tissue swelling.  Mild degenerative changes   Electronically Signed   By: Corrie Mckusick D.O.   On: 01/22/2018 14:06    Complexity Note: Imaging results reviewed. Results shared with Carolyn Roy, using Layman's terms.                         Meds   Current Outpatient Medications:  .  albuterol (PROVENTIL HFA;VENTOLIN HFA) 108 (90 Base) MCG/ACT inhaler, Inhale 2 puffs into the lungs every 6 (six) hours as needed for wheezing or shortness of breath., Disp: 1 Inhaler, Rfl: 0 .  aspirin-acetaminophen-caffeine (EXCEDRIN MIGRAINE) 250-250-65 MG tablet, Take 2 tablets by mouth daily as needed for headache., Disp: , Rfl:  .  Blood Glucose Monitoring Suppl (FIFTY50 GLUCOSE METER 2.0) w/Device KIT, Use as directed, Disp: , Rfl:  .  canagliflozin (INVOKANA) 300 MG TABS tablet, Take 300 mg by mouth daily., Disp: , Rfl:  .  glucose blood (ONE TOUCH ULTRA TEST) test strip, Use up to 4 times/day, Disp: 100 each, Rfl: 12 .  Insulin Glargine (BASAGLAR KWIKPEN) 100 UNIT/ML SOPN, INJECT 0.24 MLS (24 UNITS TOTAL) INTO THE SKIN AT BEDTIME. (Patient taking differently: Inject 34 Units into the skin at bedtime. ), Disp: 15 pen, Rfl: 2 .  Insulin Pen Needle 32G X 4 MM MISC, 1 Units by Does not apply route every morning. Pen needles, Disp: 90 each, Rfl: 3 .  lidocaine-prilocaine (EMLA) cream, Apply to affected area once (Patient taking differently: Apply 1 application topically daily  as needed (port access). Ap), Disp: 30 g, Rfl: 3 .  ondansetron (ZOFRAN) 8 MG tablet, Take 1 tablet (8 mg total) by mouth 2 (two) times daily as needed. Start on the third day after  chemotherapy., Disp: 30 tablet, Rfl: 1 .  prochlorperazine (COMPAZINE) 10 MG tablet, Take 1 tablet (10 mg total) by mouth every 6 (six) hours as needed (Nausea or vomiting)., Disp: 30 tablet, Rfl: 1 .  traMADol (ULTRAM) 50 MG tablet, Take 1 tablet (50 mg total) by mouth every 6 (six) hours as needed for severe pain. (Patient taking differently: Take 50 mg by mouth at bedtime. ), Disp: 30 tablet, Rfl: 0 .  calcium carbonate (CALCIUM 600) 600 MG TABS tablet, Take 1 tablet (600 mg total) by mouth 2 (two) times daily with a meal., Disp: 60 tablet, Rfl: 0 .  Cholecalciferol (VITAMIN D3) 125 MCG (5000 UT) CAPS, Take 1 capsule (5,000 Units total) by mouth daily with breakfast. Take along with calcium and magnesium., Disp: 30 capsule, Rfl: 5 .  [START ON 02/15/2018] ergocalciferol (VITAMIN D2) 1.25 MG (50000 UT) capsule, Take 1 capsule (50,000 Units total) by mouth 2 (two) times a week. X 6 weeks., Disp: 12 capsule, Rfl: 0 .  gabapentin (NEURONTIN) 300 MG capsule, Take 3 capsules (900 mg total) by mouth 4 (four) times daily., Disp: 360 capsule, Rfl: 0 .  Magnesium 500 MG CAPS, Take 1 capsule (500 mg total) by mouth 2 (two) times daily at 8 am and 10 pm., Disp: 60 capsule, Rfl: 5 .  meloxicam (MOBIC) 15 MG tablet, Take 1 tablet (15 mg total) by mouth daily., Disp: 30 tablet, Rfl: 0 .  oxyCODONE (OXY IR/ROXICODONE) 5 MG immediate release tablet, Take 1-2 tablets (5-10 mg total) by mouth every 6 (six) hours as needed for severe pain. Must last 30 days., Disp: 60 tablet, Rfl: 0  ROS  Constitutional: Denies any fever or chills Gastrointestinal: No reported hemesis, hematochezia, vomiting, or acute GI distress Musculoskeletal: Denies any acute onset joint swelling, redness, loss of ROM, or weakness Neurological: No reported episodes of acute onset apraxia, aphasia, dysarthria, agnosia, amnesia, paralysis, loss of coordination, or loss of consciousness  Allergies  Carolyn Roy is allergic to aspirin.  PFSH  Drug:  [name]  reports no history of drug use. Alcohol:  reports no history of alcohol use. Tobacco:  reports that she has been smoking cigarettes. She has a 5.50 pack-year smoking history. She has quit using smokeless tobacco.  Her smokeless tobacco use included snuff. Medical:  has a past medical history of Cancer ([place]) (06/15/2017), Depression, Diabetes mellitus without complication ([place]) (0037), Endometriosis, Family history of breast cancer, Headache, Hyperlipidemia, Ovarian mass, and Pneumonia. Surgical: [name]  has a past surgical history that includes Oophorectomy; Tubal ligation; Portacath placement (Right, 07/14/2017); Axillary lymph node biopsy (Left, 07/14/2017); Breast lumpectomy (Left, 01/12/2018); Breast biopsy (Left); Partial mastectomy with needle localization (Left, 01/12/2018); and Sentinel node biopsy (Left, 01/12/2018). Family: family history includes Breast cancer in an other family member; Breast cancer (age of onset: 55) in her maternal grandmother; Breast cancer (age of onset: 23) in an other family member; Breast cancer (age of onset: 72) in her maternal aunt; Diabetes in her brother; Other in her father; Pancreatitis in her brother; Prostate cancer (age of onset: 11) in her brother.  Constitutional Exam  General appearance: Well nourished, well developed, and well hydrated. In no apparent acute distress Vitals:   02/14/18 0848  BP: 118/85  Pulse: 81  Resp: 18  Temp: 97.9 F (36.6 C)  SpO2: 99%  Weight: 180 lb (81.6 kg)  Height: '5\' 3"'  (1.6 m)   BMI Assessment: Estimated body mass index is 31.89 kg/m as calculated from the following:   Height as of this encounter: '5\' 3"'  (1.6 m).   Weight as of this encounter: 180 lb (81.6 kg).  BMI interpretation table: BMI level Category Range association with higher incidence of chronic pain  <18 kg/m2 Underweight   18.5-24.9 kg/m2 Ideal body weight   25-29.9 kg/m2 Overweight Increased incidence by 20%  30-34.9 kg/m2 Obese (Class I)  Increased incidence by 68%  35-39.9 kg/m2 Severe obesity (Class II) Increased incidence by 136%  >40 kg/m2 Extreme obesity (Class III) Increased incidence by 254%   Patient's current BMI Ideal Body weight  Body mass index is 31.89 kg/m. Ideal body weight: 52.4 kg (115 lb 8.3 oz) Adjusted ideal body weight: 64.1 kg (141 lb 5 oz)   BMI Readings from Last 4 Encounters:  02/14/18 31.89 kg/m  02/08/18 31.62 kg/m  02/02/18 28.35 kg/m  02/01/18 31.63 kg/m   Wt Readings from Last 4 Encounters:  02/14/18 180 lb (81.6 kg)  02/08/18 178 lb 8 oz (81 kg)  02/02/18 181 lb (82.1 kg)  02/01/18 178 lb 9.2 oz (81 kg)  Psych/Mental status: Alert, oriented x 3 (person, place, & time)       Eyes: PERLA Respiratory: No evidence of acute respiratory distress  Cervical Spine Area Exam  Skin & Axial Inspection: No masses, redness, edema, swelling, or associated skin lesions Alignment: Symmetrical Functional ROM: Unrestricted ROM      Stability: No instability detected Muscle Tone/Strength: Functionally intact. No obvious neuro-muscular anomalies detected. Sensory (Neurological): Unimpaired Palpation: No palpable anomalies              Upper Extremity (UE) Exam    Side: Right upper extremity  Side: Left upper extremity  Skin & Extremity Inspection: Skin color, temperature, and hair growth are WNL. No peripheral edema or cyanosis. No masses, redness, swelling, asymmetry, or associated skin lesions. No contractures.  Skin & Extremity Inspection: Skin color, temperature, and hair growth are WNL. No peripheral edema or cyanosis. No masses, redness, swelling, asymmetry, or associated skin lesions. No contractures.  Functional ROM: Unrestricted ROM          Functional ROM: Unrestricted ROM          Muscle Tone/Strength: Functionally intact. No obvious neuro-muscular anomalies detected.  Muscle Tone/Strength: Functionally intact. No obvious neuro-muscular anomalies detected.  Sensory (Neurological):  Unimpaired          Sensory (Neurological): Unimpaired          Palpation: No palpable anomalies              Palpation: No palpable anomalies              Provocative Test(s):  Phalen's test: deferred Tinel's test: deferred Apley's scratch test (touch opposite shoulder):  Action 1 (Across chest): deferred Action 2 (Overhead): deferred Action 3 (LB reach): deferred   Provocative Test(s):  Phalen's test: deferred Tinel's test: deferred Apley's scratch test (touch opposite shoulder):  Action 1 (Across chest): deferred Action 2 (Overhead): deferred Action 3 (LB reach): deferred    Thoracic Spine Area Exam  Skin & Axial Inspection: No masses, redness, or swelling Alignment: Symmetrical Functional ROM: Unrestricted ROM Stability: No instability detected Muscle Tone/Strength: Functionally intact. No obvious neuro-muscular anomalies detected. Sensory (Neurological): Unimpaired Muscle strength & Tone: No palpable anomalies  Lumbar Spine  Area Exam  Skin & Axial Inspection: No masses, redness, or swelling Alignment: Symmetrical Functional ROM: Unrestricted ROM       Stability: No instability detected Muscle Tone/Strength: Functionally intact. No obvious neuro-muscular anomalies detected. Sensory (Neurological): Unimpaired Palpation: No palpable anomalies       Provocative Tests: Hyperextension/rotation test: deferred today       Lumbar quadrant test (Kemp's test): deferred today       Lateral bending test: deferred today       Patrick's Maneuver: deferred today                   FABER* test: deferred today                   S-I anterior distraction/compression test: deferred today         S-I lateral compression test: deferred today         S-I Thigh-thrust test: deferred today         S-I Gaenslen's test: deferred today         *(Flexion, ABduction and External Rotation)  Gait & Posture Assessment  Ambulation: Unassisted Gait: Relatively normal for age and body  habitus Posture: WNL   Lower Extremity Exam    Side: Right lower extremity  Side: Left lower extremity  Stability: No instability observed          Stability: No instability observed          Skin & Extremity Inspection: Skin color, temperature, and hair growth are WNL. No peripheral edema or cyanosis. No masses, redness, swelling, asymmetry, or associated skin lesions. No contractures.  Skin & Extremity Inspection: Skin color, temperature, and hair growth are WNL. No peripheral edema or cyanosis. No masses, redness, swelling, asymmetry, or associated skin lesions. No contractures.  Functional ROM: Unrestricted ROM                  Functional ROM: Unrestricted ROM                  Muscle Tone/Strength: Functionally intact. No obvious neuro-muscular anomalies detected.  Muscle Tone/Strength: Functionally intact. No obvious neuro-muscular anomalies detected.  Sensory (Neurological): Unimpaired        Sensory (Neurological): Unimpaired        DTR: Patellar: deferred today Achilles: deferred today Plantar: deferred today  DTR: Patellar: deferred today Achilles: deferred today Plantar: deferred today  Palpation: No palpable anomalies  Palpation: No palpable anomalies   Assessment & Plan  Primary Diagnosis & Pertinent Problem List: The primary encounter diagnosis was Chronic pain syndrome. Diagnoses of Cancer-related pain, Chronic feet pain (Primary Area of Pain) (Bilateral) (R>L), Burning sensation of feet (Bilateral), Neuropathic pain of feet (Bilateral), Neuropathic pain, Chemotherapy-induced neuropathy (HCC), Chronic knee pain (Secondary Area of Pain) (Bilateral) (R>L), Osteoarthritis of right knee, Chronic upper extremity pain (Tertiary Area of Pain) (Bilateral) (R>L), Chronic hand pain (Tertiary Area of Pain) (Bilateral) (R>L), Numbness and tingling in hands (Bilateral), Chronic hip pain (Fourth Area of Pain) (Bilateral) (R>L), Malignant neoplasm of lower-outer quadrant of left breast of  female, estrogen receptor positive ([place]), Pharmacologic therapy, and Vitamin D deficiency were also pertinent to this visit.  Visit Diagnosis: 1. Chronic pain syndrome   2. Cancer-related pain   3. Chronic feet pain (Primary Area of Pain) (Bilateral) (R>L)   4. Burning sensation of feet (Bilateral)   5. Neuropathic pain of feet (Bilateral)   6. Neuropathic pain   7. Chemotherapy-induced neuropathy ([place])   8. Chronic  knee pain (Secondary Area of Pain) (Bilateral) (R>L)   9. Osteoarthritis of right knee   10. Chronic upper extremity pain (Tertiary Area of Pain) (Bilateral) (R>L)   11. Chronic hand pain (Tertiary Area of Pain) (Bilateral) (R>L)   12. Numbness and tingling in hands (Bilateral)   13. Chronic hip pain (Fourth Area of Pain) (Bilateral) (R>L)   14. Malignant neoplasm of lower-outer quadrant of left breast of female, estrogen receptor positive ([place])   15. Pharmacologic therapy   16. Vitamin D deficiency    Problems updated and reviewed during this visit: Problem  Chronic upper extremity pain (Tertiary Area of Pain) (Bilateral) (R>L)  Cancer-Related Pain  Neuropathic Pain  Osteoarthritis of right knee  Chronic feet pain (Primary Area of Pain) (Bilateral) (R>L)  Neuropathic pain of feet (Bilateral)  Chronic knee pain (Secondary Area of Pain) (Bilateral) (R>L)  Chronic hand pain (Tertiary Area of Pain) (Bilateral) (R>L)  Chronic Pain Syndrome  Chronic hip pain (Fourth Area of Pain) (Bilateral) (R>L)  Burning sensation of feet (Bilateral)  Numbness and tingling in hands (Bilateral)  Malignant Neoplasm of Lower-Outer Quadrant of Left Breast of Female, Estrogen Receptor Positive (Hcc)  Chemotherapy-Induced Neuropathy (Hcc)  Family History of Breast Cancer  Cancer of Midline of Breast, Left (Hcc)  Knee Pain, Left  Chronic Fatigue  Vitamin D Deficiency  Pharmacologic Therapy  Disorder of Skeletal System  Problems Influencing Health Status  Chemotherapy-Induced Neutropenia  (Hcc)  Encounter for Antineoplastic Chemotherapy  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 ergocalciferol (VITAMIN D2) 1.25 MG (50000 UT) capsule    Sig: Take 1 capsule (50,000 Units total) by mouth 2 (two) times a week. X 6 weeks.    Dispense:  12 capsule    Refill:  0    Do not add this medication to the electronic "Automatic Refill" notification system. Patient may have prescription filled one day early if pharmacy is closed on scheduled refill date.  . Cholecalciferol (VITAMIN D3) 125 MCG (5000 UT) CAPS    Sig: Take 1 capsule (5,000 Units total) by mouth daily with breakfast. Take along with calcium and magnesium.    Dispense:  30 capsule    Refill:  5    Do not place medication on "Automatic Refill".  May substitute with similar over-the-counter product.  . Magnesium 500 MG CAPS    Sig: Take 1 capsule (500 mg total) by mouth 2 (two) times daily at 8 am and 10 pm.    Dispense:  60 capsule    Refill:  5    Do not place medication on "Automatic Refill".  The patient may use similar over-the-counter product.  . calcium carbonate (CALCIUM 600) 600 MG TABS tablet    Sig: Take 1 tablet (600 mg total) by mouth 2 (two) times daily with a meal.    Dispense:  60 tablet    Refill:  0    Do not place medication on "Automatic Refill". Fill one day early if pharmacy is closed on scheduled refill date.  [name] oxyCODONE (OXY IR/ROXICODONE) 5 MG immediate release  tablet    Sig: Take 1-2 tablets  (5-10 mg total) by mouth every 6 (six) hours as needed for severe pain. Must last 30 days.    Dispense:  60 tablet    Refill:  0    [place] STOP ACT - Not applicable. Fill one day early if pharmacy is closed on scheduled refill date. Do not fill until: 02/14/18 . Must last 30 days. To last until: 03/16/18.  [name] meloxicam (MOBIC) 15 MG tablet    Sig: Take 1 tablet (15 mg total) by mouth daily.    Dispense:  30 tablet    Refill:  0    Do not add this medication to the electronic "Automatic Refill" notification system. Patient may have prescription filled one day early if pharmacy is closed on scheduled refill date.  . gabapentin (NEURONTIN) 300 MG capsule    Sig: Take 3 capsules (900 mg total) by mouth 4 (four) times daily.    Dispense:  360 capsule    Refill:  0    Do not place medication on "Automatic Refill". Fill one day early if pharmacy is closed on scheduled refill date.    Procedure Orders     KNEE INJECTION Lab Orders  No laboratory test(s) ordered today   Imaging Orders  No imaging studies ordered today   Referral Orders  No referral(s) requested today    Pharmacological management options:  Opioid Analgesics: We'll take over management today. See above orders Membrane stabilizer: Today I will be taking over her Neurontin and I will be increasing her dose to 900 mg 4 times daily Muscle relaxant: We have discussed the possibility of a trial NSAID: Today I will be starting a trial of Mobic 15 mg p.o. daily. Other analgesic(s): To be determined at a later time   Interventional management options: Planned, scheduled, and/or pending:    Diagnostic right intra-articular knee injection with L.A + Steroids #1, no sedation or fluoro needed.   Considering:   Diagnostic sympathetic nerve block Diagnostic intra-articular right knee injection Diagnostic right knee Hyalgan series   PRN Procedures:   None at this time   Provider-requested follow-up: Return in about 1 month (around  03/17/2018) for Med-Mgmt, w/ Dr. Dossie Arbour, + Procedure (no sedation): (R) IA Knee inj #1 (Local + Steroid).  Future Appointments  Date Time Provider [place]  02/14/2018  2:00 PM Fredirick Maudlin, MD [facility] None  02/19/2018  1:00 PM [facility]Outpat[facility]  02/20/2018  9:00 AM Noreene Filbert, MD [facility] None  02/20/2018 10:00 AM [facility] LAB [facility] None  02/28/2018 11:30 AM [facility] GYN ONC [facility] None  03/14/2018  8:30 AM Milinda Pointer, MD [facility] None    Primary Care Physician: Patient, No Pcp Per Location: [facility] Note by: Gaspar Cola, MD Date: 02/14/2018; Time: 11:35 AM

## 2018-02-14 ENCOUNTER — Encounter: Payer: Self-pay | Admitting: Pain Medicine

## 2018-02-14 ENCOUNTER — Encounter: Payer: Self-pay | Admitting: General Surgery

## 2018-02-14 ENCOUNTER — Ambulatory Visit: Payer: 59 | Attending: Pain Medicine | Admitting: Pain Medicine

## 2018-02-14 ENCOUNTER — Other Ambulatory Visit: Payer: Self-pay

## 2018-02-14 ENCOUNTER — Ambulatory Visit (INDEPENDENT_AMBULATORY_CARE_PROVIDER_SITE_OTHER): Payer: 59 | Admitting: General Surgery

## 2018-02-14 VITALS — BP 107/76 | HR 89 | Temp 97.7°F | Ht 63.0 in | Wt 181.4 lb

## 2018-02-14 VITALS — BP 118/85 | HR 81 | Temp 97.9°F | Resp 18 | Ht 63.0 in | Wt 180.0 lb

## 2018-02-14 DIAGNOSIS — M79642 Pain in left hand: Secondary | ICD-10-CM

## 2018-02-14 DIAGNOSIS — E559 Vitamin D deficiency, unspecified: Secondary | ICD-10-CM | POA: Insufficient documentation

## 2018-02-14 DIAGNOSIS — T451X5A Adverse effect of antineoplastic and immunosuppressive drugs, initial encounter: Secondary | ICD-10-CM | POA: Insufficient documentation

## 2018-02-14 DIAGNOSIS — M1711 Unilateral primary osteoarthritis, right knee: Secondary | ICD-10-CM | POA: Insufficient documentation

## 2018-02-14 DIAGNOSIS — G620 Drug-induced polyneuropathy: Secondary | ICD-10-CM | POA: Insufficient documentation

## 2018-02-14 DIAGNOSIS — M79672 Pain in left foot: Secondary | ICD-10-CM | POA: Insufficient documentation

## 2018-02-14 DIAGNOSIS — C50512 Malignant neoplasm of lower-outer quadrant of left female breast: Secondary | ICD-10-CM | POA: Insufficient documentation

## 2018-02-14 DIAGNOSIS — G894 Chronic pain syndrome: Secondary | ICD-10-CM | POA: Insufficient documentation

## 2018-02-14 DIAGNOSIS — M792 Neuralgia and neuritis, unspecified: Secondary | ICD-10-CM | POA: Insufficient documentation

## 2018-02-14 DIAGNOSIS — G893 Neoplasm related pain (acute) (chronic): Secondary | ICD-10-CM | POA: Insufficient documentation

## 2018-02-14 DIAGNOSIS — M79671 Pain in right foot: Secondary | ICD-10-CM | POA: Insufficient documentation

## 2018-02-14 DIAGNOSIS — Z170 Estrogen receptor positive status [ER+]: Secondary | ICD-10-CM | POA: Insufficient documentation

## 2018-02-14 DIAGNOSIS — M79641 Pain in right hand: Secondary | ICD-10-CM

## 2018-02-14 DIAGNOSIS — R208 Other disturbances of skin sensation: Secondary | ICD-10-CM

## 2018-02-14 DIAGNOSIS — M25551 Pain in right hip: Secondary | ICD-10-CM | POA: Insufficient documentation

## 2018-02-14 DIAGNOSIS — M25561 Pain in right knee: Secondary | ICD-10-CM

## 2018-02-14 DIAGNOSIS — G5793 Unspecified mononeuropathy of bilateral lower limbs: Secondary | ICD-10-CM

## 2018-02-14 DIAGNOSIS — G8929 Other chronic pain: Secondary | ICD-10-CM | POA: Insufficient documentation

## 2018-02-14 DIAGNOSIS — T8130XD Disruption of wound, unspecified, subsequent encounter: Secondary | ICD-10-CM

## 2018-02-14 DIAGNOSIS — R202 Paresthesia of skin: Secondary | ICD-10-CM | POA: Insufficient documentation

## 2018-02-14 DIAGNOSIS — M79602 Pain in left arm: Secondary | ICD-10-CM | POA: Insufficient documentation

## 2018-02-14 DIAGNOSIS — M25562 Pain in left knee: Secondary | ICD-10-CM

## 2018-02-14 DIAGNOSIS — M25552 Pain in left hip: Secondary | ICD-10-CM

## 2018-02-14 DIAGNOSIS — Z79899 Other long term (current) drug therapy: Secondary | ICD-10-CM | POA: Insufficient documentation

## 2018-02-14 DIAGNOSIS — R200 Anesthesia of skin: Secondary | ICD-10-CM

## 2018-02-14 DIAGNOSIS — M79601 Pain in right arm: Secondary | ICD-10-CM

## 2018-02-14 DIAGNOSIS — Z9889 Other specified postprocedural states: Secondary | ICD-10-CM

## 2018-02-14 MED ORDER — CALCIUM CARBONATE 600 MG PO TABS: 600.0000 mg | ORAL_TABLET | Freq: Two times a day (BID) | ORAL | 0 refills | Status: DC

## 2018-02-14 MED ORDER — GABAPENTIN 300 MG PO CAPS: 900.0000 mg | ORAL_CAPSULE | Freq: Four times a day (QID) | ORAL | 0 refills | Status: DC

## 2018-02-14 MED ORDER — ERGOCALCIFEROL 1.25 MG (50000 UT) PO CAPS: 50000.0000 [IU] | ORAL_CAPSULE | ORAL | 0 refills | Status: DC

## 2018-02-14 MED ORDER — OXYCODONE HCL 5 MG PO TABS: 5.0000 mg | ORAL_TABLET | Freq: Four times a day (QID) | ORAL | 0 refills | Status: DC | PRN

## 2018-02-14 MED ORDER — VITAMIN D3 125 MCG (5000 UT) PO CAPS: 1.0000 | ORAL_CAPSULE | Freq: Every day | ORAL | 5 refills | Status: DC

## 2018-02-14 MED ORDER — MELOXICAM 15 MG PO TABS: 15.0000 mg | ORAL_TABLET | Freq: Every day | ORAL | 0 refills | Status: AC

## 2018-02-14 MED ORDER — MAGNESIUM 500 MG PO CAPS: 500.0000 mg | ORAL_CAPSULE | Freq: Two times a day (BID) | ORAL | 5 refills | Status: DC

## 2018-02-14 NOTE — Patient Instructions (Addendum)
You have been escribed medications to your pharmacy to last until 03-16-18. You have been given pre procedure instructions without sedation. You have signed the medicaion agreement ______________________________________________________________________________________________  Specialty Pain Scale  Introduction:  There are significant differences in how pain is reported. The word pain usually refers to physical pain, but it is also a common synonym of suffering. The medical community uses a scale from 0 (zero) to 10 (ten) to report pain level. Zero (0) is described as "no pain", while ten (10) is described as "the worse pain you can imagine". The problem with this scale is that physical pain is reported along with suffering. Suffering refers to mental pain, or more often yet it refers to any unpleasant feeling, emotion or aversion associated with the perception of harm or threat of harm. It is the psychological component of pain.  Pain Specialists prefer to separate the two components. The pain scale used by this practice is the Verbal Numerical Rating Scale (VNRS-11). This scale is for the physical pain only. DO NOT INCLUDE how your pain psychologically affects you. This scale is for adults 2 years of age and older. It has 11 (eleven) levels. The 1st level is 0/10. This means: "right now, I have no pain". In the context of pain management, it also means: "right now, my physical pain is under control with the current therapy".  General Information:  The scale should reflect your current level of pain. Unless you are specifically asked for the level of your worst pain, or your average pain. If you are asked for one of these two, then it should be understood that it is over the past 24 hours.  Levels 1 (one) through 5 (five) are described below, and can be treated as an outpatient. Ambulatory pain management facilities such as ours are more than adequate to treat these levels. Levels 6 (six) through 10  (ten) are also described below, however, these must be treated as a hospitalized patient. While levels 6 (six) and 7 (seven) may be evaluated at an urgent care facility, levels 8 (eight) through 10 (ten) constitute medical emergencies and as such, they belong in a hospital's emergency department. When having these levels (as described below), do not come to our office. Our facility is not equipped to manage these levels. Go directly to an urgent care facility or an emergency department to be evaluated.  Definitions:  Activities of Daily Living (ADL): Activities of daily living (ADL or ADLs) is a term used in healthcare to refer to people's daily self-care activities. Health professionals often use a person's ability or inability to perform ADLs as a measurement of their functional status, particularly in regard to people post injury, with disabilities and the elderly. There are two ADL levels: Basic and Instrumental. Basic Activities of Daily Living (BADL  or BADLs) consist of self-care tasks that include: Bathing and showering; personal hygiene and grooming (including brushing/combing/styling hair); dressing; Toilet hygiene (getting to the toilet, cleaning oneself, and getting back up); eating and self-feeding (not including cooking or chewing and swallowing); functional mobility, often referred to as "transferring", as measured by the ability to walk, get in and out of bed, and get into and out of a chair; the broader definition (moving from one place to another while performing activities) is useful for people with different physical abilities who are still able to get around independently. Basic ADLs include the things many people do when they get up in the morning and get ready to go out  of the house: get out of bed, go to the toilet, bathe, dress, groom, and eat. On the average, loss of function typically follows a particular order. Hygiene is the first to go, followed by loss of toilet use and  locomotion. The last to go is the ability to eat. When there is only one remaining area in which the person is independent, there is a 62.9% chance that it is eating and only a 3.5% chance that it is hygiene. Instrumental Activities of Daily Living (IADL or IADLs) are not necessary for fundamental functioning, but they let an individual live independently in a community. IADL consist of tasks that include: cleaning and maintaining the house; home establishment and maintenance; care of others (including selecting and supervising caregivers); care of pets; child rearing; managing money; managing financials (investments, etc.); meal preparation and cleanup; shopping for groceries and necessities; moving within the community; safety procedures and emergency responses; health management and maintenance (taking prescribed medications); and using the telephone or other form of communication.  Instructions:  Most patients tend to report their pain as a combination of two factors, their physical pain and their psychosocial pain. This last one is also known as "suffering" and it is reflection of how physical pain affects you socially and psychologically. From now on, report them separately.  From this point on, when asked to report your pain level, report only your physical pain. Use the following table for reference.  Pain Clinic Pain Levels (0-5/10)  Pain Level Score  Description  No Pain 0   Mild pain 1 Nagging, annoying, but does not interfere with basic activities of daily living (ADL). Patients are able to eat, bathe, get dressed, toileting (being able to get on and off the toilet and perform personal hygiene functions), transfer (move in and out of bed or a chair without assistance), and maintain continence (able to control bladder and bowel functions). Blood pressure and heart rate are unaffected. A normal heart rate for a healthy adult ranges from 60 to 100 bpm (beats per minute).   Mild to moderate pain  2 Noticeable and distracting. Impossible to hide from other people. More frequent flare-ups. Still possible to adapt and function close to normal. It can be very annoying and may have occasional stronger flare-ups. With discipline, patients may get used to it and adapt.   Moderate pain 3 Interferes significantly with activities of daily living (ADL). It becomes difficult to feed, bathe, get dressed, get on and off the toilet or to perform personal hygiene functions. Difficult to get in and out of bed or a chair without assistance. Very distracting. With effort, it can be ignored when deeply involved in activities.   Moderately severe pain 4 Impossible to ignore for more than a few minutes. With effort, patients may still be able to manage work or participate in some social activities. Very difficult to concentrate. Signs of autonomic nervous system discharge are evident: dilated pupils (mydriasis); mild sweating (diaphoresis); sleep interference. Heart rate becomes elevated (>115 bpm). Diastolic blood pressure (lower number) rises above 100 mmHg. Patients find relief in laying down and not moving.   Severe pain 5 Intense and extremely unpleasant. Associated with frowning face and frequent crying. Pain overwhelms the senses.  Ability to do any activity or maintain social relationships becomes significantly limited. Conversation becomes difficult. Pacing back and forth is common, as getting into a comfortable position is nearly impossible. Pain wakes you up from deep sleep. Physical signs will be obvious: pupillary dilation; increased  sweating; goosebumps; brisk reflexes; cold, clammy hands and feet; nausea, vomiting or dry heaves; loss of appetite; significant sleep disturbance with inability to fall asleep or to remain asleep. When persistent, significant weight loss is observed due to the complete loss of appetite and sleep deprivation.  Blood pressure and heart rate becomes significantly elevated. Caution:  If elevated blood pressure triggers a pounding headache associated with blurred vision, then the patient should immediately seek attention at an urgent or emergency care unit, as these may be signs of an impending stroke.    Emergency Department Pain Levels (6-10/10)  Emergency Room Pain 6 Severely limiting. Requires emergency care and should not be seen or managed at an outpatient pain management facility. Communication becomes difficult and requires great effort. Assistance to reach the emergency department may be required. Facial flushing and profuse sweating along with potentially dangerous increases in heart rate and blood pressure will be evident.   Distressing pain 7 Self-care is very difficult. Assistance is required to transport, or use restroom. Assistance to reach the emergency department will be required. Tasks requiring coordination, such as bathing and getting dressed become very difficult.   Disabling pain 8 Self-care is no longer possible. At this level, pain is disabling. The individual is unable to do even the most "basic" activities such as walking, eating, bathing, dressing, transferring to a bed, or toileting. Fine motor skills are lost. It is difficult to think clearly.   Incapacitating pain 9 Pain becomes incapacitating. Thought processing is no longer possible. Difficult to remember your own name. Control of movement and coordination are lost.   The worst pain imaginable 10 At this level, most patients pass out from pain. When this level is reached, collapse of the autonomic nervous system occurs, leading to a sudden drop in blood pressure and heart rate. This in turn results in a temporary and dramatic drop in blood flow to the brain, leading to a loss of consciousness. Fainting is one of the body's self defense mechanisms. Passing out puts the brain in a calmed state and causes it to shut down for a while, in order to begin the healing process.    Summary: 1. Refer to this  scale when providing [place] with your pain level. 2. Be accurate and careful when reporting your pain level. This will help with your care. 3. Over-reporting your pain level will lead to loss of credibility. 4. Even a level of 1/10 means that there is pain and will be treated at our facility. 5. High, inaccurate reporting will be documented as "Symptom Exaggeration", leading to loss of credibility and suspicions of possible secondary gains such as obtaining more narcotics, or wanting to appear disabled, for fraudulent reasons. 6. Only pain levels of 5 or below will be seen at our facility. 7. Pain levels of 6 and above will be sent to the Emergency Department and the appointment cancelled. ______________________________________________________________________________________________   ____________________________________________________________________________________________  Medication Rules  Purpose: To inform patients, and their family members, of our rules and regulations.  Applies to: All patients receiving prescriptions (written or electronic).  Pharmacy of record: Pharmacy where electronic prescriptions will be sent. If written prescriptions are taken to a different pharmacy, please inform the nursing staff. The pharmacy listed in the electronic medical record should be the one where you would like electronic prescriptions to be sent.  Electronic prescriptions: In compliance with the [place] (STOP) Act of 2017 (Session Law 2017-74/H243), effective February 07, 2018, all controlled substances  must be electronically prescribed. Calling prescriptions to the pharmacy will cease to exist.  Prescription refills: Only during scheduled appointments. Applies to all prescriptions.  NOTE: The following applies primarily to controlled substances (Opioid* Pain Medications).   Patient's responsibilities: 1. Pain Pills: Bring all pain pills to every  appointment (except for procedure appointments). 2. Pill Bottles: Bring pills in original pharmacy bottle. Always bring the newest bottle. Bring bottle, even if empty. 3. Medication refills: You are responsible for knowing and keeping track of what medications you take and those you need refilled. The day before your appointment: write a list of all prescriptions that need to be refilled. The day of the appointment: give the list to the admitting nurse. Prescriptions will be written only during appointments. If you forget a medication: it will not be "Called in", "Faxed", or "electronically sent". You will need to get another appointment to get these prescribed. No early refills. Do not call asking to have your prescription filled early. 4. Prescription Accuracy: You are responsible for carefully inspecting your prescriptions before leaving our office. Have the discharge nurse carefully go over each prescription with you, before taking them home. Make sure that your name is accurately spelled, that your address is correct. Check the name and dose of your medication to make sure it is accurate. Check the number of pills, and the written instructions to make sure they are clear and accurate. Make sure that you are given enough medication to last until your next medication refill appointment. 5. Taking Medication: Take medication as prescribed. When it comes to controlled substances, taking less pills or less frequently than prescribed is permitted and encouraged. Never take more pills than instructed. Never take medication more frequently than prescribed.  6. Inform other Doctors: Always inform, all of your healthcare providers, of all the medications you take. 7. Pain Medication from other Providers: You are not allowed to accept any additional pain medication from any other Doctor or Healthcare provider. There are two exceptions to this rule. (see below) In the event that you require additional pain  medication, you are responsible for notifying us, as stated below. 8. Medication Agreement: You are responsible for carefully reading and following our Medication Agreement. This must be signed before receiving any prescriptions from our practice. Safely store a copy of your signed Agreement. Violations to the Agreement will result in no further prescriptions. (Additional copies of our Medication Agreement are available upon request.) 9. Laws, Rules, & Regulations: All patients are expected to follow all Federal and [employer], [employer], Rules, [employer]. Ignorance of the Laws does not constitute a valid excuse. The use of any illegal substances is prohibited. 10. Adopted CDC guidelines & recommendations: Target dosing levels will be at or below 60 MME/day. Use of benzodiazepines** is not recommended.  Exceptions: There are only two exceptions to the rule of not receiving pain medications from other Healthcare Providers. 1. Exception #1 (Emergencies): In the event of an emergency (i.e.: accident requiring emergency care), you are allowed to receive additional pain medication. However, you are responsible for: As soon as you are able, call our office [phone], at any time of the day or night, and leave a message stating your name, the date and nature of the emergency, and the name and dose of the medication prescribed. In the event that your call is answered by a member of our staff, make sure to document and save the date, time, and the name of the person that took your  information.  2. Exception #2 (Planned Surgery): In the event that you are scheduled by another doctor or dentist to have any type of surgery or procedure, you are allowed (for a period no longer than 30 days), to receive additional pain medication, for the acute post-op pain. However, in this case, you are responsible for picking up a copy of our "Post-op Pain Management for Surgeons" handout, and giving it to your surgeon or  dentist. This document is available at our office, and does not require an appointment to obtain it. Simply go to our office during business hours (Monday-Thursday from 8:00 AM to 4:00 PM) (Friday 8:00 AM to 12:00 Noon) or if you have a scheduled appointment with [place], prior to your surgery, and ask for it by name. In addition, you will need to provide [place] with your name, name of your surgeon, type of surgery, and date of procedure or surgery.  *Opioid medications include: morphine, codeine, oxycodone, oxymorphone, hydrocodone, hydromorphone, meperidine, tramadol, tapentadol, buprenorphine, fentanyl, methadone. **Benzodiazepine medications include: diazepam (Valium), alprazolam (Xanax), clonazepam (Klonopine), lorazepam (Ativan), clorazepate (Tranxene), chlordiazepoxide (Librium), estazolam (Prosom), oxazepam (Serax), temazepam (Restoril), triazolam (Halcion) (Last updated: 04/06/2017) ____________________________________________________________________________________________   ____________________________________________________________________________________________  Medication Recommendations and Reminders  Applies to: All patients receiving prescriptions (written and/or electronic).  Medication Rules & Regulations: These rules and regulations exist for your safety and that of others. They are not flexible and neither are we. Dismissing or ignoring them will be considered "non-compliance" with medication therapy, resulting in complete and irreversible termination of such therapy. (See document titled "Medication Rules" for more details.) In all conscience, because of safety reasons, we cannot continue providing a therapy where the patient does not follow instructions.  Pharmacy of record:   Definition: This is the pharmacy where your electronic prescriptions will be sent.   We do not endorse any particular pharmacy.  You are not restricted in your choice of pharmacy.  The pharmacy listed in  the electronic medical record should be the one where you want electronic prescriptions to be sent.  If you choose to change pharmacy, simply notify our nursing staff of your choice of new pharmacy.  Recommendations:  Keep all of your pain medications in a safe place, under lock and key, even if you live alone.   After you fill your prescription, take 1 week's worth of pills and put them away in a safe place. You should keep a separate, properly labeled bottle for this purpose. The remainder should be kept in the original bottle. Use this as your primary supply, until it runs out. Once it's gone, then you know that you have 1 week's worth of medicine, and it is time to come in for a prescription refill. If you do this correctly, it is unlikely that you will ever run out of medicine.  To make sure that the above recommendation works, it is very important that you make sure your medication refill appointments are scheduled at least 1 week before you run out of medicine. To do this in an effective manner, make sure that you do not leave the office without scheduling your next medication management appointment. Always ask the nursing staff to show you in your prescription , when your medication will be running out. Then arrange for the receptionist to get you a return appointment, at least 7 days before you run out of medicine. Do not wait until you have 1 or 2 pills left, to come in. This is very poor planning and does not  take into consideration that we may need to cancel appointments due to bad weather, sickness, or emergencies affecting our staff.  "Partial Fill": If for any reason your pharmacy does not have enough pills/tablets to completely fill or refill your prescription, do not allow for a "partial fill". You will need a separate prescription to fill the remaining amount, which we will not provide. If the reason for the partial fill is your insurance, you will need to talk to the pharmacist about  payment alternatives for the remaining tablets, but again, do not accept a partial fill.  Prescription refills and/or changes in medication(s):   Prescription refills, and/or changes in dose or medication, will be conducted only during scheduled medication management appointments. (Applies to both, written and electronic prescriptions.)  No refills on procedure days. No medication will be changed or started on procedure days. No changes, adjustments, and/or refills will be conducted on a procedure day. Doing so will interfere with the diagnostic portion of the procedure.  No phone refills. No medications will be "called into the pharmacy".  No Fax refills.  No weekend refills.  No Holliday refills.  No after hours refills.  Remember:  Business hours are:  Monday to Thursday 8:00 AM to 4:00 PM Provider's Schedule: Dionisio David, NP - Appointments are:  Medication management: Monday to Thursday 8:00 AM to 4:00 PM Milinda Pointer, MD - Appointments are:  Medication management: Monday and Wednesday 8:00 AM to 4:00 PM Procedure day: Tuesday and Thursday 7:30 AM to 4:00 PM Gillis Santa, MD - Appointments are:  Medication management: Tuesday and Thursday 8:00 AM to 4:00 PM Procedure day: Monday and Wednesday 7:30 AM to 4:00 PM (Last update: 04/06/2017) ____________________________________________________________________________________________   ____________________________________________________________________________________________  CANNABIDIOL (AKA: CBD Oil or Pills)  Applies to: All patients receiving prescriptions of controlled substances (written and/or electronic).  General Information: Cannabidiol (CBD) was discovered in 53. It is one of some 113 identified cannabinoids in cannabis (Marijuana) plants, accounting for up to 40% of the plant's extract. As of 2018, preliminary clinical research on cannabidiol included studies of anxiety, cognition, movement disorders, and  pain.  Cannabidiol is consummed in multiple ways, including inhalation of cannabis smoke or vapor, as an aerosol spray into the cheek, and by mouth. It may be supplied as CBD oil containing CBD as the active ingredient (no added tetrahydrocannabinol (THC) or terpenes), a full-plant CBD-dominant hemp extract oil, capsules, dried cannabis, or as a liquid solution. CBD is thought not have the same psychoactivity as THC, and may affect the actions of THC. Studies suggest that CBD may interact with different biological targets, including cannabinoid receptors and other neurotransmitter receptors. As of 2018 the mechanism of action for its biological effects has not been determined.  In the [place], cannabidiol has a limited approval by the Food and Drug Administration (FDA) for treatment of only two types of epilepsy disorders. The side effects of long-term use of the drug include somnolence, decreased appetite, diarrhea, fatigue, malaise, weakness, sleeping problems, and others.  CBD remains a Schedule I drug prohibited for any use.  Legality: Some manufacturers ship CBD products nationally, an illegal action which the FDA has not enforced in 2018, with CBD remaining the subject of an FDA investigational new drug evaluation, and is not considered legal as a dietary supplement or food ingredient as of December 2018. Federal illegality has made it difficult historically to conduct research on CBD. CBD is openly sold in head shops and health food stores in some states where  such sales have not been explicitly legalized.  Warning: Because it is not FDA approved for general use or treatment of pain, it is not required to undergo the same manufacturing controls as prescription drugs.  This means that the available cannabidiol (CBD) may be contaminated with THC.  If this is the case, it will trigger a positive urine drug screen (UDS) test for cannabinoids (Marijuana).  Because a positive UDS for illicit  substances is a violation of our medication agreement, your opioid analgesics (pain medicine) may be permanently discontinued. (Last update: 04/27/2017) ____________________________________________________________________________________________

## 2018-02-14 NOTE — Patient Instructions (Signed)
Patient is to return to the office in 1 week for wound check. Continue to clean and pack the area.  Call the office with any questions or concerns.

## 2018-02-14 NOTE — Progress Notes (Signed)
I initially saw this patient last week.  My initial H&P note is copied below:  "Carolyn Temkin Whiteis a 53 y.o.females/p left breast wire localized lumpectomy with tissue transfer and sentinel node biopsy with Dr. Bary Castilla on 01/12/18. She was seen post-op by Dr. Bary Castilla on 12/12 and had been doing well. She presented to the clinic again on 12/26 and saw Dr. Rosana Hoes regarding concerns for her left breast wound, particularly swelling of the breast and redness of the skin. She presented again to the ED the night of 12/26 and was seen by Dr. Owens Shark. At that point, she had some cellulitis around the left breast incision, with dehiscence of a portion of the skin incision, revealing yellow fluid drainage. She was given a dose of IV antibiotic in the ED after discussing with Dr. Hampton Abbot, who was on call that night, given prescription for Bactrim, and sent for follow up today with me. She had normal labs in ED, with WBC of 7.2.  She was seen by Dr. Hampton Abbot on 02 February 2018.  The wound was opened and fibrinous material was sharply debrided.  The wound was then dressed with wet-to-dry gauze.  She is here today for reevaluation of her incision.  She states that she continues to have a fair amount of clear yellow drainage.  Her significant other is performing daily dressing changes as recommended.  They state that the redness surrounding the incision has markedly improved.  She denies fevers or chills."  She is here today for additional wound follow-up.  She states that she is packing the wound as instructed and showering each day prior to repacking the wound.  Her significant other is assisting her.  She states that there is been less drainage.  She denies fevers or chills.  She feels like the wound is less deep.  Vitals:   02/14/18 1400  BP: 107/76  Pulse: 89  Temp: 97.7 F (36.5 C)  SpO2: 97%   Focused examination: The dressing was removed.  There is some skin excoriation from the tape present.  The  packing was then removed.  There is minimal drainage on the external gauze or on the packing gauze.  The cavity tunneling medially is not as deep as last week.  The wound itself is more superficial and there is only a tiny amount of fibrinous exudate present.  The wound was repacked with half-inch iodoform gauze.  Additional gauze and tape were applied as a dressing.  Assessment and plan: This is a 53 year old woman with wound complications after going undergoing lumpectomy.  She is overall doing well and she and her significant other are doing an excellent job of caring for the wound at home.  She will continue with dressing changes on a daily basis.  I will see her in 1 week's time for an additional wound check.

## 2018-02-15 ENCOUNTER — Ambulatory Visit: Payer: Self-pay | Admitting: Surgery

## 2018-02-19 ENCOUNTER — Ambulatory Visit
Admission: RE | Admit: 2018-02-19 | Discharge: 2018-02-19 | Disposition: A | Discharge status: Home / Self Care | Payer: No Typology Code available for payment source | Source: Ambulatory Visit | Attending: Nurse Practitioner | Admitting: Nurse Practitioner

## 2018-02-19 DIAGNOSIS — N841 Polyp of cervix uteri: Secondary | ICD-10-CM | POA: Diagnosis not present

## 2018-02-20 ENCOUNTER — Other Ambulatory Visit: Payer: Self-pay

## 2018-02-20 ENCOUNTER — Encounter: Payer: Self-pay | Admitting: Radiation Oncology

## 2018-02-20 ENCOUNTER — Ambulatory Visit
Admission: RE | Admit: 2018-02-20 | Discharge: 2018-02-20 | Disposition: A | Discharge status: Home / Self Care | Payer: Self-pay | Source: Ambulatory Visit | Attending: Radiation Oncology | Admitting: Radiation Oncology

## 2018-02-20 ENCOUNTER — Inpatient Hospital Stay: Payer: Self-pay

## 2018-02-20 VITALS — BP 113/76 | HR 85 | Temp 96.5°F | Wt 180.1 lb

## 2018-02-20 DIAGNOSIS — F17210 Nicotine dependence, cigarettes, uncomplicated: Secondary | ICD-10-CM | POA: Insufficient documentation

## 2018-02-20 DIAGNOSIS — R51 Headache: Secondary | ICD-10-CM | POA: Insufficient documentation

## 2018-02-20 DIAGNOSIS — Z8042 Family history of malignant neoplasm of prostate: Secondary | ICD-10-CM | POA: Insufficient documentation

## 2018-02-20 DIAGNOSIS — C50512 Malignant neoplasm of lower-outer quadrant of left female breast: Secondary | ICD-10-CM | POA: Insufficient documentation

## 2018-02-20 DIAGNOSIS — Z9012 Acquired absence of left breast and nipple: Secondary | ICD-10-CM | POA: Insufficient documentation

## 2018-02-20 DIAGNOSIS — N809 Endometriosis, unspecified: Secondary | ICD-10-CM | POA: Insufficient documentation

## 2018-02-20 DIAGNOSIS — F329 Major depressive disorder, single episode, unspecified: Secondary | ICD-10-CM | POA: Insufficient documentation

## 2018-02-20 DIAGNOSIS — Z803 Family history of malignant neoplasm of breast: Secondary | ICD-10-CM | POA: Insufficient documentation

## 2018-02-20 DIAGNOSIS — Z170 Estrogen receptor positive status [ER+]: Secondary | ICD-10-CM | POA: Insufficient documentation

## 2018-02-20 DIAGNOSIS — Z794 Long term (current) use of insulin: Secondary | ICD-10-CM | POA: Insufficient documentation

## 2018-02-20 DIAGNOSIS — E785 Hyperlipidemia, unspecified: Secondary | ICD-10-CM | POA: Insufficient documentation

## 2018-02-20 DIAGNOSIS — G629 Polyneuropathy, unspecified: Secondary | ICD-10-CM | POA: Insufficient documentation

## 2018-02-20 DIAGNOSIS — Z79899 Other long term (current) drug therapy: Secondary | ICD-10-CM | POA: Insufficient documentation

## 2018-02-20 DIAGNOSIS — E119 Type 2 diabetes mellitus without complications: Secondary | ICD-10-CM | POA: Insufficient documentation

## 2018-02-20 DIAGNOSIS — Z9221 Personal history of antineoplastic chemotherapy: Secondary | ICD-10-CM | POA: Insufficient documentation

## 2018-02-20 MED ORDER — SODIUM CHLORIDE 0.9% FLUSH
10.0000 mL | Freq: Once | INTRAVENOUS | Status: AC
  Filled 2018-02-20: qty 10

## 2018-02-20 MED ORDER — HEPARIN SOD (PORK) LOCK FLUSH 100 UNIT/ML IV SOLN: 500.0000 [IU] | Freq: Once | INTRAVENOUS | Status: AC

## 2018-02-20 MED ORDER — HEPARIN SOD (PORK) LOCK FLUSH 100 UNIT/ML IV SOLN
INTRAVENOUS | Status: AC
  Filled 2018-02-20: qty 5

## 2018-02-20 NOTE — Consult Note (Signed)
NEW PATIENT EVALUATION  Name: Carolyn Roy  MRN: [id number]  Date:   02/20/2018     DOB: 06-17-1965   This 53 y.o. 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  REFERRING PHYSICIAN: No ref. provider found  CHIEF COMPLAINT:  Chief Complaint  Patient presents with  . Breast Cancer    Initial Eval    DIAGNOSIS: The encounter diagnosis was Malignant neoplasm of lower-outer quadrant of left breast of female, estrogen receptor positive ([place]).   PREVIOUS INVESTIGATIONS:  Pathology reports reviewed Clinical notes reviewed Mammogram ultrasound and PET/CT scans reviewed  HPI: patient is a 53 year old female who presented with self discovered mass in her left breast dating back to May 2019. At that time there was an irregular mass associated with architectural distortion within the retroareolar left breast measuring approximately 3.5 cm in greatest dimension. The was also surrounding satellite masses pushing the overall measurement approximate 5.5 cm. She also had a surgical cervical lesion which was biopsied and found to be benign.initial PET CT scan showing hypermetabolic central left breast mass with subtle activity in left axillary lymph nodes. Cervical prominence was not hypermetabolic.biopsy of cervix showed an inflamed polyp with tubal metaplasia.cytology biopsy or lymph node was positive for malignant cells consistent with metastatic carcinoma. 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 2 sentinel lymph nodes were negative for metastatic disease. Patient had no evidence of malignancy at the time of wide local excision with no evidence of residual invasive carcinoma.in total 5 lymph nodes with 3 sentinel lymph nodes were examined all free of malignancy.by fish her tumor is HER-2/neu negative. She has had some swelling of her left breast and had some cellulitis around her incision which was treated with antibiotic therapy.She also had some slight dehiscence of a portion of the skin incision with marked yellow fluid drainage. She is seen today for radiation oncology opinion. She still has bandages on her left breast no further drainage at this time.  PLANNED TREATMENT REGIMEN: left breast and peripheral lymphatic radiation  PAST MEDICAL HISTORY:  has a past medical history of Cancer ([place]) (06/15/2017), Depression, Diabetes mellitus without complication ([place]) (1443), Endometriosis, Family history of breast cancer, Headache, Hyperlipidemia, Ovarian mass, and Pneumonia.    PAST SURGICAL HISTORY:  Past Surgical History:  Procedure Laterality Date  . AXILLARY LYMPH NODE BIOPSY Left 07/14/2017   Procedure: INSERTION GEL MARK CLIP LEFT AXILLA;  Surgeon: Robert Bellow, MD;  Location: [facility] ORS;  Service: General;  Laterality: Left;  . BREAST BIOPSY Left    Dr Orlene Och BREAST METASTATIC CARCINOMA  . BREAST LUMPECTOMY Left 01/12/2018  . OOPHORECTOMY    . PARTIAL MASTECTOMY WITH NEEDLE LOCALIZATION Left 01/12/2018   Procedure: PARTIAL MASTECTOMY WITH NEEDLE LOCALIZATION;  Surgeon: Robert Bellow, MD;  Location: [facility] ORS;  Service: General;  Laterality: Left;  . PORTACATH PLACEMENT Right 07/14/2017   Procedure: INSERTION PORT-A-CATH;  Surgeon: Robert Bellow, MD;  Location: [facility] ORS;  Service: General;  Laterality: Right;  . SENTINEL  NODE BIOPSY Left  01/12/2018   Procedure: SENTINEL NODE BIOPSY;  Surgeon: Robert Bellow, MD;  Location: [facility] ORS;  Service: General;  Laterality: Left;  . TUBAL LIGATION      FAMILY HISTORY: family history includes Breast cancer in an other family member; Breast cancer (age of onset: 79) in her maternal grandmother; Breast cancer (age of onset: 7) in an other family member; Breast cancer (age of onset: 27) in her maternal aunt; Diabetes in her brother; Other in her father; Pancreatitis in her brother; Prostate cancer (age of onset: 10) in her brother.  SOCIAL HISTORY:  reports that she has been smoking cigarettes. She has a 5.50 pack-year smoking history. She has quit using smokeless tobacco.  Her smokeless tobacco use included snuff. She reports that she does not drink alcohol or use drugs.  ALLERGIES: Aspirin  MEDICATIONS:  Current Outpatient Medications  Medication Sig Dispense Refill  . albuterol (PROVENTIL HFA;VENTOLIN HFA) 108 (90 Base) MCG/ACT inhaler Inhale 2 puffs into the lungs every 6 (six) hours as needed for wheezing or shortness of breath. 1 Inhaler 0  . aspirin-acetaminophen-caffeine (EXCEDRIN MIGRAINE) 250-250-65 MG tablet Take 2 tablets by mouth daily as needed for headache.    . Blood Glucose Monitoring Suppl (FIFTY50 GLUCOSE METER 2.0) w/Device KIT Use as directed    . calcium carbonate (CALCIUM 600) 600 MG TABS tablet Take 1 tablet (600 mg total) by mouth 2 (two) times daily with a meal. 60 tablet 0  . canagliflozin (INVOKANA) 300 MG TABS tablet Take 300 mg by mouth daily.    . Cholecalciferol (VITAMIN D3) 125 MCG (5000 UT) CAPS Take 1 capsule (5,000 Units total) by mouth daily with breakfast. Take along with calcium and magnesium. 30 capsule 5  . ergocalciferol (VITAMIN D2) 1.25 MG (50000 UT) capsule Take 1 capsule (50,000 Units total) by mouth 2 (two) times a week. X 6 weeks. 12 capsule 0  . gabapentin (NEURONTIN) 300 MG capsule Take 3 capsules (900 mg total) by mouth 4 (four) times  daily. 360 capsule 0  . glucose blood (ONE TOUCH ULTRA TEST) test strip Use up to 4 times/day 100 each 12  . Insulin Glargine (BASAGLAR KWIKPEN) 100 UNIT/ML SOPN INJECT 0.24 MLS (24 UNITS TOTAL) INTO THE SKIN AT BEDTIME. (Patient taking differently: Inject 34 Units into the skin at bedtime. ) 15 pen 2  . Insulin Pen Needle 32G X 4 MM MISC 1 Units by Does not apply route every morning. Pen needles 90 each 3  . lidocaine-prilocaine (EMLA) cream Apply to affected area once (Patient taking differently: Apply 1 application topically daily as needed (port access). Ap) 30 g 3  . Magnesium 500 MG CAPS Take 1 capsule (500 mg total) by mouth 2 (two) times daily at 8 am and 10 pm. 60 capsule 5  . meloxicam (MOBIC) 15 MG tablet Take 1 tablet (15 mg total) by mouth daily. 30 tablet 0  . ondansetron (ZOFRAN) 8 MG tablet Take 1 tablet (8 mg total) by mouth 2 (two) times daily as needed. Start on the third day after chemotherapy. 30 tablet 1  . oxyCODONE (OXY IR/ROXICODONE) 5 MG immediate release tablet Take 1-2 tablets (5-10 mg total) by mouth every 6 (six) hours as needed for severe pain. Must last 30 days. 60 tablet 0  . prochlorperazine (COMPAZINE) 10 MG tablet Take 1 tablet (10 mg total) by mouth every 6 (six) hours as needed (Nausea or vomiting). 30 tablet 1  . traMADol (ULTRAM) 50 MG tablet Take  1 tablet (50 mg total) by mouth every 6 (six) hours as needed for severe pain. (Patient taking differently: Take 50 mg by mouth at bedtime. ) 30 tablet 0   No current facility-administered medications for this encounter.    Facility-Administered Medications Ordered in Other Encounters  Medication Dose Route Frequency Provider Last Rate Last Dose  . heparin lock flush 100 unit/mL  500 Units Intravenous Once Corcoran, Melissa C, MD      . sodium chloride flush (NS) 0.9 % injection 10 mL  10 mL Intravenous Once Corcoran, Drue Second, MD        ECOG PERFORMANCE STATUS:  1 - Symptomatic but completely  ambulatory  REVIEW OF SYSTEMS:  Patient denies any weight loss, fatigue, weakness, fever, chills or night sweats. Patient denies any loss of vision, blurred vision. Patient denies any ringing  of the ears or hearing loss. No irregular heartbeat. Patient denies heart murmur or history of fainting. Patient denies any chest pain or pain radiating to her upper extremities. Patient denies any shortness of breath, difficulty breathing at night, cough or hemoptysis. Patient denies any swelling in the lower legs. Patient denies any nausea vomiting, vomiting of blood, or coffee ground material in the vomitus. Patient denies any stomach pain. Patient states has had normal bowel movements no significant constipation or diarrhea. Patient denies any dysuria, hematuria or significant nocturia. Patient denies any problems walking, swelling in the joints or loss of balance. Patient denies any skin changes, loss of hair or loss of weight. Patient denies any excessive worrying or anxiety or significant depression. Patient denies any problems with insomnia. Patient denies excessive thirst, polyuria, polydipsia. Patient denies any swollen glands, patient denies easy bruising or easy bleeding. Patient denies any recent infections, allergies or URI. Patient "s visual fields have not changed significantly in recent time.    PHYSICAL EXAM: BP 113/76   Pulse 85   Temp (!) 96.5 F (35.8 C)   Wt 180 lb 1.9 oz (81.7 kg)   BMI 31.91 kg/m  Patient has a bandaged left breast no dominant mass or nodularity is noted in either breast in 2 positions examined. No axillary or supraclavicular adenopathy is appreciated.Well-developed well-nourished patient in NAD. HEENT reveals PERLA, EOMI, discs not visualized.  Oral cavity is clear. No oral mucosal lesions are identified. Neck is clear without evidence of cervical or supraclavicular adenopathy. Lungs are clear to A&P. Cardiac examination is essentially unremarkable with regular rate and  rhythm without murmur rub or thrill. Abdomen is benign with no organomegaly or masses noted. Motor sensory and DTR levels are equal and symmetric in the upper and lower extremities. Cranial nerves II through XII are grossly intact. Proprioception is intact. No peripheral adenopathy or edema is identified. No motor or sensory levels are noted. Crude visual fields are within normal range.  LABORATORY DATA: pathology reports reviewed    RADIOLOGY RESULTS:PET CT scan mammograms ultrasound all reviewed and compatible above-stated findings   IMPRESSION: stage IIIa invasive mammary carcinoma the left breast status post neoadjuvant chemotherapy with a complete response pathologically at the time of wide local excision and sentinel node biopsy in 53 year old female  PLAN: at this time I to go ahead with left breast and peripheral hepatic radiation. Would take both areas to 5040 cGy in 28 fractions. I also would boost her scar another 1400 cGy. Risks and benefits of treatment including skin reaction fatigue alteration of blood counts possible inclusion of superficial lung and slight chance of lymphedema of her left  upper extremity all were discussed in detail. I've emphasized the need for her to continue exercising her left upper extremity. I like the wound to heal a little more side effect put off simulation for about 2 weeks. Patient also will be candidate for antiestrogen therapy after completion of radiation. Patient comprehends my treatment plan well.  I would like to take this opportunity to thank you for allowing me to participate in the care of your patient.Noreene Filbert, MD

## 2018-02-22 ENCOUNTER — Ambulatory Visit (INDEPENDENT_AMBULATORY_CARE_PROVIDER_SITE_OTHER): Payer: Self-pay | Admitting: General Surgery

## 2018-02-22 ENCOUNTER — Encounter: Payer: Self-pay | Admitting: General Surgery

## 2018-02-22 ENCOUNTER — Other Ambulatory Visit: Payer: Self-pay

## 2018-02-22 VITALS — BP 108/71 | HR 87 | Temp 95.8°F | Resp 16 | Ht 65.0 in | Wt 180.5 lb

## 2018-02-22 DIAGNOSIS — T8130XD Disruption of wound, unspecified, subsequent encounter: Secondary | ICD-10-CM

## 2018-02-22 NOTE — Progress Notes (Signed)
I initially saw this patient on 02/08/2018.  My initial H&P note is copied below:  "Carolyn Roy a 53 y.o.females/p left breast wire localized lumpectomy with tissue transfer and sentinel node biopsy with Dr. Bary Castilla on 01/12/18. She was seen post-op by Dr. Bary Castilla on 12/12 and had been doing well. She presented to the clinic again on 12/26 and saw Dr. Rosana Hoes regarding concerns for her left breast wound, particularly swelling of the breast and redness of the skin. She presented again to the ED the night of 12/26 and was seen by Dr. Owens Shark. At that point, she had some cellulitis around the left breast incision, with dehiscence of a portion of the skin incision, revealing yellow fluid drainage. She was given a dose of IV antibiotic in the ED after discussing withDr. Patrica Duel on call that night, given prescription for Bactrim, and sent for follow up today with me. She had normal labs in ED, with WBC of 7.2.  She was seen by Dr. Hampton Abbot on 02 February 2018. The wound was opened and fibrinous material was sharply debrided. The wound was then dressed with wet-to-dry gauze. She is here today for reevaluation of her incision. She states that she continues to have a fair amount of clear yellow drainage. Her significant other is performing daily dressing changes as recommended. They state that the redness surrounding the incision has markedly improved. She denies fevers or chills."  I saw her a week ago, at which time the wound was continuing to heal well.  She saw Dr. Baruch Gouty on 02/20/2018 to plan radiation treatment.  She states they will still plan for simulation but may delay treatment while the wound closes.    She is here today for additional wound follow-up.  She states that she is packing the wound as instructed and showering each day prior to repacking the wound.  Her significant other is assisting her.  She states that there is been less drainage.  She denies fevers or chills.     Vitals:   02/22/18 0913  BP: 108/71  Pulse: 87  Resp: 16  Temp: (!) 95.8 F (35.4 C)  SpO2: 97%   Focused examination: The packing was removed.  There is essentially no drainage present.  The wound was probed and there is only minimal tracking towards the midline.  There is no odor or erythema.  There remains a small amount of peri-incisional induration.  Her axillary incision is healing nicely.  Impression and plan: Carolyn Roy is a 53 year old woman who underwent a lumpectomy and sentinel lymph node biopsy.  She has had complications at her breast incision site secondary to seroma formation.  This is being managed with local wound care.  She and her significant other are doing an excellent job taking care of the site.  I will plan to see her in 2 weeks time to monitor the wound for healing.

## 2018-02-22 NOTE — Patient Instructions (Signed)
Please see follow up appointment listed below.   Please continue to wash the wound with soap and water, pat dry and place new packing material in the wound and cover with a dressing and secure with tape.

## 2018-02-28 ENCOUNTER — Encounter: Payer: Self-pay | Admitting: General Surgery

## 2018-02-28 ENCOUNTER — Inpatient Hospital Stay: Payer: 59 | Attending: Obstetrics and Gynecology | Admitting: Obstetrics and Gynecology

## 2018-02-28 ENCOUNTER — Encounter: Payer: Self-pay | Admitting: Obstetrics and Gynecology

## 2018-02-28 VITALS — BP 101/71 | HR 96 | Temp 96.8°F | Resp 18 | Ht 65.0 in | Wt 179.1 lb

## 2018-02-28 DIAGNOSIS — N858 Other specified noninflammatory disorders of uterus: Secondary | ICD-10-CM | POA: Insufficient documentation

## 2018-02-28 DIAGNOSIS — N841 Polyp of cervix uteri: Secondary | ICD-10-CM

## 2018-02-28 DIAGNOSIS — Z452 Encounter for adjustment and management of vascular access device: Secondary | ICD-10-CM | POA: Insufficient documentation

## 2018-02-28 DIAGNOSIS — N840 Polyp of corpus uteri: Secondary | ICD-10-CM | POA: Insufficient documentation

## 2018-02-28 DIAGNOSIS — F17210 Nicotine dependence, cigarettes, uncomplicated: Secondary | ICD-10-CM | POA: Diagnosis not present

## 2018-02-28 DIAGNOSIS — C50912 Malignant neoplasm of unspecified site of left female breast: Secondary | ICD-10-CM | POA: Diagnosis present

## 2018-02-28 DIAGNOSIS — R102 Pelvic and perineal pain: Secondary | ICD-10-CM | POA: Diagnosis not present

## 2018-02-28 NOTE — Progress Notes (Signed)
Gynecologic Oncology Interval Visit   Referring Providers: Dr. Nolon Stalls (Medical Oncology at [place]) Dr. Hervey Ard  Chief Complaint: Follow up inflamed polyp with focal glandular atypia  Subjective:  Carolyn Roy is a 53 y.o. female, initially seen in consultation from Dr. Mike Gip for cervical mass on 07/05/2017.  She presents today for follow up and her ultrasound results.    Of note the patient has stage IIIA left breast and has completed 6 cycles of neoadjuvant taxol. She has seen Dr. Bary Castilla for surgery and underwent left breast wide excision with sentinel node biopsy on 01/12/18. She has seen Dr. Baruch Gouty for adjuvant radiation with plans to start this week.   She plans to keep her care at [facility] vs transitioning to [facility] with Dr. Mike Gip but has not seen a medical-oncologist.   02/19/2018- US pelvic complete with transvaginal Uterus: 8.6 x 5.0 x 6.1. Mildly heterogeneous myometrium without focal myometrial mass. Nabothian cysts at cervix Endometrium: 39m thickness- complex, heterogeneous with hypoechoic areas.  Right Ovary- 2.8 x 1.4 x 2.6 (normal) Left ovary- not visualized  IMPRESSION: Complex heterogeneous endometrial complex with small amount of endometrial fluid and a questionable area of focal abnormality question mass at mid uterus; recommend sonohysterogram for further evaluation, prior to hysteroscopy or endometrial biopsy.  Today, she complains of pelvic pain, no bleeding.  Gynecologic Oncology History CCALANDRA MADURAis a pleasant female who is seen in consultation from Dr. CMike Gipfor cervical mass.   Patient was initially referred to medical oncology for clinical stage IIIa left breast cancer, s/p biopsy. Pathology revealed grade III invasive ductal carcinoma area axillary FNA revealed malignant cells.  Tumor was ER positive, PR positive, HER-2/neu negative.  CA-27-29 was normal.  FSH and estradiol confirmed premenopausal status.  CT C/A/P on  06/29/2017 revealed: an infiltrative heterogeneously enhancing mass in the cervical region extending into the lower uterine segment that was concerning for primary cervical carcinoma.  IMPRESSION: 1. Large mass in the central aspect of the left breast measuring pproximately 3.7 x 4.4 x 4.2 cm. There are borderline enlarged left axillary and left internal mammary lymph nodes which are nonspecific but suspicious in the setting of known breast cancer. No other definite signs of metastatic disease elsewhere in the chest, abdomen or pelvis. 2. However, there is what appears to be an infiltrative heterogeneously enhancing mass in the cervical region extending into the lower uterine segment, concerning for concurrent primary cervical carcinoma.  "Cervical region extending into the lower uterine segment overall estimated to measure approximately 4.5 x 5.2 x 4.1"  Per Dr. CMike Gip currently they plan for neoadjuvant chemotherapy with standard dose dense AC every 2 weeks for 4 cycles followed by weekly Taxol for 12 weeks.    She had not been getting regular Pap smears/pelvic exams and estimates last Pap smear approximately 2009.  She reports a history of abnormal pap smears and believes that she may have had '3 spots cut out of my cervix 30 years ago'.   [facility] revealed 2 cm endocervical lesion located at the os and ballooning out the cervix, tender, mobile, firm to palpation  07/05/2017: DIAGNOSIS:  A. CERVIX; BIOPSY:  - INFLAMED POLYP WITH TUBAL METAPLASIA AND FOCAL GLANDULAR ATYPIA, SEE NOTE.  - MULTIPLE DEEPER LEVELS WERE EXAMINED.   Note: A p16 stain is obtained and highlights a focal area of atypia as well as inflamed epithelium. FPilar Platemalignancy is not identified in this material.   Pap:  unsatisfactory for evaluation, insufficient squamous cellularity. HRHPV negative.   07/17/2017 IMPRESSION: 1. Hypermetabolic central left breast mass, maximum SUV 8.3,  compatible with malignancy. Subtle accentuated activity in a small left axillary lymph node, but below background blood pool activity. 2. The cervical prominence noted on recent CT is not appreciably hypermetabolic. If there is underlying cervical cancer it is not appreciably hypermetabolic. However, there is some mild prominence of the endometrial stripe, which could result from cervical stenosis. 3.  Aortic Atherosclerosis (ICD10-I70.0).  07/14/2017 she underwent 1) placement of radiologic marker and left axillary nodal metastasis; 2) right subclavian PowerPort placement with ultrasound and fluoroscopic guidance with Dr. Bary Castilla.  MRI pelvis 10/27/2017 Reproductive: The uterus measures 8.6 x 4.8 x 6.6 cm (volume = 140 cm^3). There is fluid identified within the endometrial canal. No focal enhancing endometrial mass identified. There are 4 nabothian cyst identified within the wall of the cervix. The largest measures 1.6 cm. No discrete cervical mass identified. Normal physiologic appearance of the ovaries. No adnexal mass. Other:  No free fluid or fluid collections. Musculoskeletal: No suspicious bone lesions identified. IMPRESSION: 1. No endometrial or cervical mass identified. Correlation with dedicated pelvic exam is advised. 2. Multiple nabothian cysts identified within the wall of the cervix.  Genetic Testing- [employer] 83 gene multi cancer panel.  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   Problem List: Patient Active Problem List   Diagnosis Date Noted  . Chronic upper extremity pain [facility] Area of Pain) (Bilateral) (R>L) 02/14/2018  . Cancer-related pain 02/14/2018  . Vitamin D deficiency 02/14/2018  . Neuropathic pain 02/14/2018  . Osteoarthritis of right knee 02/14/2018  . Type 2 diabetes mellitus with hyperglycemia, with long-term current use of  insulin ([place]) 01/23/2018  . Chronic feet pain (Primary Area of Pain) (Bilateral) (R>L) 01/22/2018  . Neuropathic pain of feet (Bilateral) 01/22/2018  . Chronic knee pain (Secondary Area of Pain) (Bilateral) (R>L) 01/22/2018  . Chronic hand pain [facility] Area of Pain) (Bilateral) (R>L) 01/22/2018  . Chronic pain syndrome 01/22/2018  . Pharmacologic therapy 01/22/2018  . Disorder of skeletal system 01/22/2018  . Problems influencing health status 01/22/2018  . Chronic hip pain (Fourth Area of Pain) (Bilateral) (R>L) 01/22/2018  . Burning sensation of feet (Bilateral) 01/09/2018  . Numbness and tingling in hands (Bilateral) 01/09/2018  . Malignant neoplasm of lower-outer quadrant of left breast of female, estrogen receptor positive ([place]) 11/08/2017  . Chemotherapy-induced neuropathy ([place]) 10/19/2017  . Chemotherapy-induced neutropenia ([place]) 07/29/2017  . Hyperglycemia 07/28/2017  . Goals of care, counseling/discussion 07/20/2017  . Encounter for antineoplastic chemotherapy 07/20/2017  . Cervical polyp 07/05/2017  . Family history of breast cancer   . Cancer of midline of breast, left ([place]) 06/15/2017  . Stage 1 mild COPD by GOLD classification ([place]) 03/31/2017  . Hypercholesterolemia 03/03/2017  . Tobacco abuse 11/15/2016  . Depression, major, single episode, severe ([place]) 11/15/2016  . Knee pain, left 05/29/2015  . Poorly controlled type 2 diabetes mellitus ([place]) 03/30/2015  . Chronic fatigue 03/30/2015  . Severe depression ([place]) 03/30/2015   Past Medical History: Past Medical History:  Diagnosis Date  . Cancer ([place]) 06/15/2017   5.1 cm, T3,N1 (clinical): ER/ PR positive, Her 2 neu not overexpressed, High Ki 67. Neuoadjuvant chemotherapy.   . Depression   . Diabetes mellitus without complication ([place]) 6468  . Endometriosis   .  Family history of breast cancer   . Headache    migraines  . Hyperlipidemia   . Ovarian mass   . Pneumonia    2018   Past Surgical History: Past  Surgical History:  Procedure Laterality Date  . AXILLARY LYMPH NODE BIOPSY Left 07/14/2017   Procedure: INSERTION GEL MARK CLIP LEFT AXILLA;  Surgeon: Robert Bellow, MD;  Location: [facility] ORS;  Service: General;  Laterality: Left;  . BREAST BIOPSY Left    Dr Orlene Och BREAST METASTATIC CARCINOMA  . BREAST LUMPECTOMY Left 01/12/2018  . OOPHORECTOMY    . PARTIAL MASTECTOMY WITH NEEDLE LOCALIZATION Left 01/12/2018   Procedure: PARTIAL MASTECTOMY WITH NEEDLE LOCALIZATION;  Surgeon: Robert Bellow, MD;  Location: [facility] ORS;  Service: General;  Laterality: Left;  . PORTACATH PLACEMENT Right 07/14/2017   Procedure: INSERTION PORT-A-CATH;  Surgeon: Robert Bellow, MD;  Location: [facility] ORS;  Service: General;  Laterality: Right;  . SENTINEL NODE BIOPSY Left 01/12/2018   Procedure: SENTINEL NODE BIOPSY;  Surgeon: Robert Bellow, MD;  Location: [facility] ORS;  Service: General;  Laterality: Left;  . TUBAL LIGATION     Past Gynecologic History:  G3P3 Age of menarche: 29 Duration of periods: 1-2 days Contraception: tubal  OB History:  OB History  Gravida Para Term Preterm AB Living  3 3          SAB TAB Ectopic Multiple Live Births               # Outcome Date GA Lbr Len/2nd Weight Sex Delivery Anes PTL Lv  3 Para           2 Para           1 Para             Obstetric Comments  1st Menstrual Cycle:  14   1st Pregnancy:  67    Family History: Family History  Problem Relation Age of Onset  . Other Father        No info about father or paternal relatives  . Diabetes Brother   . Pancreatitis Brother   . Prostate cancer Brother 7       currently 16 / maternal half-brother  . Breast cancer Maternal Grandmother 40       deceased 46s  . Breast cancer Maternal Aunt 65       currently 33  . Breast cancer Other 57       mother's sister; deceased 36  . Breast cancer Other        mother's sister; age at dx unknown    Social History: Social History   Socioeconomic History  .  Marital status: Single    Spouse name: Not on file  . Number of children: Not on file  . Years of education: Not on file  . Highest education level: Not on file  Occupational History  . Not on file  Social Needs  . Financial resource strain: Not on file  . Food insecurity:    Worry: Not on file    Inability: Not on file  . Transportation needs:    Medical: Not on file    Non-medical: Not on file  Tobacco Use  . Smoking status: Current Every Day Smoker    Packs/day: 0.50    Years: 11.00    Pack years: 5.50    Types: Cigarettes  . Smokeless tobacco: Former Systems developer    Types: Snuff  Substance and Sexual Activity  . Alcohol use:  No    Alcohol/week: 0.0 standard drinks  . Drug use: No  . Sexual activity: Yes  Lifestyle  . Physical activity:    Days per week: Not on file    Minutes per session: Not on file  . Stress: Not on file  Relationships  . Social connections:    Talks on phone: Not on file    Gets together: Not on file    Attends religious service: Not on file    Active member of club or organization: Not on file    Attends meetings of clubs or organizations: Not on file    Relationship status: Not on file  . Intimate partner violence:    Fear of current or ex partner: Not on file    Emotionally abused: Not on file    Physically abused: Not on file    Forced sexual activity: Not on file  Other Topics Concern  . Not on file  Social History Narrative  . Not on file   Allergies: Allergies  Allergen Reactions  . Aspirin Nausea And Vomiting   Current Medications: Current Outpatient Medications  Medication Sig Dispense Refill  . albuterol (PROVENTIL HFA;VENTOLIN HFA) 108 (90 Base) MCG/ACT inhaler Inhale 2 puffs into the lungs every 6 (six) hours as needed for wheezing or shortness of breath. 1 Inhaler 0  . aspirin-acetaminophen-caffeine (EXCEDRIN MIGRAINE) 250-250-65 MG tablet Take 2 tablets by mouth daily as needed for headache.    . Blood Glucose Monitoring Suppl  (FIFTY50 GLUCOSE METER 2.0) w/Device KIT Use as directed    . calcium carbonate (CALCIUM 600) 600 MG TABS tablet Take 1 tablet (600 mg total) by mouth 2 (two) times daily with a meal. 60 tablet 0  . canagliflozin (INVOKANA) 300 MG TABS tablet Take 300 mg by mouth daily.    . Cholecalciferol (VITAMIN D3) 125 MCG (5000 UT) CAPS Take 1 capsule (5,000 Units total) by mouth daily with breakfast. Take along with calcium and magnesium. 30 capsule 5  . ergocalciferol (VITAMIN D2) 1.25 MG (50000 UT) capsule Take 1 capsule (50,000 Units total) by mouth 2 (two) times a week. X 6 weeks. 12 capsule 0  . gabapentin (NEURONTIN) 300 MG capsule Take 3 capsules (900 mg total) by mouth 4 (four) times daily. 360 capsule 0  . glucose blood (ONE TOUCH ULTRA TEST) test strip Use up to 4 times/day 100 each 12  . Insulin Glargine (BASAGLAR KWIKPEN) 100 UNIT/ML SOPN INJECT 0.24 MLS (24 UNITS TOTAL) INTO THE SKIN AT BEDTIME. (Patient taking differently: Inject 34 Units into the skin at bedtime. ) 15 pen 2  . Insulin Pen Needle 32G X 4 MM MISC 1 Units by Does not apply route every morning. Pen needles 90 each 3  . lidocaine-prilocaine (EMLA) cream Apply to affected area once (Patient taking differently: Apply 1 application topically daily as needed (port access). Ap) 30 g 3  . Magnesium 500 MG CAPS Take 1 capsule (500 mg total) by mouth 2 (two) times daily at 8 am and 10 pm. 60 capsule 5  . meloxicam (MOBIC) 15 MG tablet Take 1 tablet (15 mg total) by mouth daily. 30 tablet 0  . ondansetron (ZOFRAN) 8 MG tablet Take 1 tablet (8 mg total) by mouth 2 (two) times daily as needed. Start on the third day after chemotherapy. 30 tablet 1  . oxyCODONE (OXY IR/ROXICODONE) 5 MG immediate release tablet Take 1-2 tablets (5-10 mg total) by mouth every 6 (six) hours as needed for severe pain. Must  last 30 days. 60 tablet 0  . prochlorperazine (COMPAZINE) 10 MG tablet Take 1 tablet (10 mg total) by mouth every 6 (six) hours as needed (Nausea  or vomiting). 30 tablet 1  . traMADol (ULTRAM) 50 MG tablet Take 1 tablet (50 mg total) by mouth every 6 (six) hours as needed for severe pain. (Patient taking differently: Take 50 mg by mouth at bedtime. ) 30 tablet 0   No current facility-administered medications for this visit.    Facility-Administered Medications Ordered in Other Visits  Medication Dose Route Frequency Provider Last Rate Last Dose  . heparin lock flush 100 unit/mL  500 Units Intravenous Once Corcoran, Melissa C, MD      . sodium chloride flush (NS) 0.9 % injection 10 mL  10 mL Intravenous Once Lequita Asal, MD       Review of Systems General: fatigue, weakness Skin: no complaints Eyes: no complaints HEENT: no complaints Breasts: no complaints Pulmonary: cough Cardiac: leg swelling Gastrointestinal: decreased appetite, nausea, constipation Genitourinary/Sexual: no complaints Ob/Gyn: pelvic pain Musculoskeletal: no complaints Hematology: no complaints Neurologic/Psych: peripheral neuropathy, depression   Objective:  Physical Examination:  BP 101/71   Pulse 96   Temp (!) 96.8 F (36 C) (Tympanic)   Resp 18   Ht '5\' 5"'  (1.651 m)   Wt 179 lb 1.6 oz (81.2 kg)   BMI 29.80 kg/m    Vitals:   02/28/18 1141  BP: 101/71  Pulse: 96  Resp: 18  Temp: (!) 96.8 F (36 C)   Vitals:   02/28/18 1141  Weight: 179 lb 1.6 oz (81.2 kg)  Height: '5\' 5"'  (1.651 m)   ECOG Performance Status: 0 - Asymptomatic   GENERAL: Patient is a well appearing female in no acute distress HEENT:  Sclera anicteric. Alopecia LUNGS:  Clear to auscultation bilaterally.  No wheezes or rhonchi. HEART:  Regular rate and rhythm. No murmur appreciated. ABDOMEN:  Soft, nontender.  Positive, normoactive bowel sounds.  EXTREMITIES:  No peripheral edema.   SKIN:  Clear with no obvious rashes or skin changes. No nail dyscrasia. BREAST: deferred. Known breast cancer. Breast exams with Dr. Mike Gip  Pelvic: Exam Chaperoned by NP, EGBUS:  no lesions,  Vagina: no discharge or bleeding Cervix: External os is wide open. The polyp is visible in the canal is approximately 1 cm. Cervix non-tender, non-friable, mobile.  Uterus: mobile, not enlarged Adnexa: no palpable masses Rectovaginal: deferred.  Procedure note:  Consent was signed and time out performed.  Tenaculum placed on cervix and Pipelle inserted easily to 6 cm.  No appreciable tissue obtained with aspiration.  The polyp in the canal was removed almost completely with cervical biopsy forceps and Monsels applied for hemostasis.  She tolerated this well and potential side effects including bleeding were reviewed.     Assessment:  ROSLIND MICHAUX is a 53 y.o. female with incidental finding of cervical mass on CT scan 5/19 which was performed for treatment planning for breast cancer. Radiologic and pathology findings from 5/19 biopsy consistent with inflamed cervical polyp with focal glandular atypia. Pap unsatisfactory, HRHPV negative. PET negative for appreciable metabolic activity in cervix.   Slightly thickened endometrial stripe; MRI fluid in uterus and no mass  Medical co-morbidities complicating care: Type 2 Diabetes Mellitus (poorly controlled), Smoker, stage T3N1Mx (stage II) Breast Cancer, COPD. Body mass index is 29.8 kg/m.  Plan:   Problem List Items Addressed This Visit      Genitourinary   Cervical polyp - Primary  Continue radiation therapy for breast cancer as planned.   Will await pathology report from cervical polyp and endometrial biopsy and then make plans for further management.  Patient not eager for a hysterectomy if not necessary.   A total of 25 minutes were spent with the patient/family today; over 50% was spent in education, counseling and coordination of care for cervical polyp and possible thickened endometrial stripe.  Mellody Drown, MD   CC:  Referring Providers: Dr. Nolon Stalls (Medical Oncology at [place]) Dr. Hervey Ard

## 2018-02-28 NOTE — Progress Notes (Signed)
Pt has a lot of neuropathy with the chemo. Fingers and toes bilateral , some nausea at times. She has irritation in her vagina and pelvic pain at times. Some constipation but drinks applejuice to make her have BM. Decreased appetite and told her to snack on things 4-6 times through the day and eat good foods- not bad ones.

## 2018-02-28 NOTE — Patient Instructions (Addendum)
Cervical Biopsy, Care After This sheet gives you information about how to care for yourself after your procedure. Your health care provider may also give you more specific instructions. If you have problems or questions, contact your health care provider. What can I expect after the procedure? After the procedure, it is common to have:  Cramping or mild pain for a few days.  Slight bleeding from the vagina for a few days.  Dark-colored vaginal discharge for a few days. Follow these instructions at home: Lifestyle  Do not douche until your health care provider approves.  Do not use tampons until your health care provider approves.  Do not have sex until your health care provider approves.  Return to your normal activities as told by your health care provider. Ask your health care provider what activities are safe for you. General instructions   Take over-the-counter and prescription medicines only as told by your health care provider.  Use a sanitary napkin until bleeding and discharge stop.  Keep all follow-up visits as told by your health care provider. This is important. Contact a health care provider if you have:  A fever or chills.  Bad-smelling vaginal discharge.  Itching or irritation around the vagina.  Lower abdominal pain. Get help right away if you:  Develop heavy vaginal bleeding that soaks more than one sanitary napkin an hour.  Faint.  Have severe pain in your lower abdomen. Summary  After the procedure, it is normal to have cramps, slight bleeding, and dark-colored discharge from the vagina.  After you return home, do not douche, have sex, or use a tampon until your health care provider approves.  Take over-the-counter and prescription medicines only as told by your health care provider.  Get help right away if you develop heavy bleeding, you faint, or you have severe pain in your lower abdomen. This information is not intended to replace advice  given to you by your health care provider. Make sure you discuss any questions you have with your health care provider. Document Released: 10/15/2014 Document Revised: 06/29/2017 Document Reviewed: 06/29/2017 Elsevier Interactive Patient Education  2019 [employer].   Endometrial Biopsy, Care After This sheet gives you information about how to care for yourself after your procedure. Your health care provider may also give you more specific instructions. If you have problems or questions, contact your health care provider. What can I expect after the procedure? After the procedure, it is common to have:  Mild cramping.  A small amount of vaginal bleeding for a few days. This is normal. Follow these instructions at home:   Take over-the-counter and prescription medicines only as told by your health care provider.  Do not douche, use tampons, or have sexual intercourse until your health care provider approves.  Return to your normal activities as told by your health care provider. Ask your health care provider what activities are safe for you.  Follow instructions from your health care provider about any activity restrictions, such as restrictions on strenuous exercise or heavy lifting. Contact a health care provider if:  You have heavy bleeding, or bleed for longer than 2 days after the procedure.  You have bad smelling discharge from your vagina.  You have a fever or chills.  You have a burning sensation when urinating or you have difficulty urinating.  You have severe pain in your lower abdomen. Get help right away if:  You have severe cramps in your stomach or back.  You pass large blood clots.  Your bleeding increases.  You become weak or light-headed, or you pass out. Summary  After the procedure, it is common to have mild cramping and a small amount of vaginal bleeding for a few days.  Do not douche, use tampons, or have sexual intercourse until your health care  provider approves.  Return to your normal activities as told by your health care provider. Ask your health care provider what activities are safe for you. This information is not intended to replace advice given to you by your health care provider. Make sure you discuss any questions you have with your health care provider. Document Released: 11/14/2012 Document Revised: 02/10/2016 Document Reviewed: 02/10/2016 Elsevier Interactive Patient Education  2019 [employer].

## 2018-03-01 ENCOUNTER — Inpatient Hospital Stay: Payer: 59

## 2018-03-01 ENCOUNTER — Ambulatory Visit: Payer: 59

## 2018-03-01 DIAGNOSIS — Z452 Encounter for adjustment and management of vascular access device: Secondary | ICD-10-CM | POA: Diagnosis not present

## 2018-03-01 DIAGNOSIS — Z95828 Presence of other vascular implants and grafts: Secondary | ICD-10-CM

## 2018-03-01 MED ORDER — HEPARIN SOD (PORK) LOCK FLUSH 100 UNIT/ML IV SOLN
500.0000 [IU] | Freq: Once | INTRAVENOUS | Status: AC
  Administered 2018-03-01: 500 [IU] via INTRAVENOUS

## 2018-03-01 MED ORDER — SODIUM CHLORIDE 0.9% FLUSH
10.0000 mL | Freq: Once | INTRAVENOUS | Status: AC
  Administered 2018-03-01: 10 mL via INTRAVENOUS
  Filled 2018-03-01: qty 10

## 2018-03-02 LAB — SURGICAL PATHOLOGY

## 2018-03-05 ENCOUNTER — Encounter: Payer: Self-pay | Admitting: General Surgery

## 2018-03-05 ENCOUNTER — Telehealth: Payer: Self-pay

## 2018-03-05 ENCOUNTER — Other Ambulatory Visit: Payer: Self-pay

## 2018-03-05 ENCOUNTER — Ambulatory Visit (INDEPENDENT_AMBULATORY_CARE_PROVIDER_SITE_OTHER): Payer: 59 | Admitting: General Surgery

## 2018-03-05 VITALS — BP 122/78 | HR 86 | Resp 14 | Ht 64.0 in | Wt 178.6 lb

## 2018-03-05 DIAGNOSIS — T8130XD Disruption of wound, unspecified, subsequent encounter: Secondary | ICD-10-CM

## 2018-03-05 LAB — SURGICAL PATHOLOGY

## 2018-03-05 NOTE — Progress Notes (Signed)
Iinitially saw this patient on 02/08/2018. My initial H&P note is copied below:  "Carolyn Roy a 53 y.o.females/p left breast wire localized lumpectomy with tissue transfer and sentinel node biopsy with Dr. Bary Castilla on 01/12/18. She was seen post-op by Dr. Bary Castilla on 12/12 and had been doing well. She presented to the clinic again on 12/26 and saw Dr. Rosana Hoes regarding concerns for her left breast wound, particularly swelling of the breast and redness of the skin. She presented again to the ED the night of 12/26 and was seen by Dr. Owens Shark. At that point, she had some cellulitis around the left breast incision, with dehiscence of a portion of the skin incision, revealing yellow fluid drainage. She was given a dose of IV antibiotic in the ED after discussing withDr. Patrica Duel on call that night, given prescription for Bactrim, and sent for follow up today with me. She had normal labs in ED, with WBC of 7.2.  She was seen by Dr. Hampton Abbot on 02 February 2018. The wound was opened and fibrinous material was sharply debrided. The wound was then dressed with wet-to-dry gauze. She is here today for reevaluation of her incision. She states that she continues to have a fair amount of clear yellow drainage. Her significant other is performing daily dressing changes as recommended. They state that the redness surrounding the incision has markedly improved. She denies fevers or chills."  I last saw her on 1.16.2020.  The wound was continuing to heal well.  She saw Dr. Baruch Gouty, but, due to the wound still being open, no treatment planning was able to be arranged.  She is scheduled to see him again on 2.10.2020.  She denies any fevers or chills.  She is continuing daily dressing changes.  Her significant other is with her today and states that he is placing less and less gauze each time.  There is only a small amount of serous drainage present when the dressing is changed.  Vitals:   03/05/18  1022  BP: 122/78  Pulse: 86  Resp: 14  SpO2: 97%   Focused examination: The dressing and packing was removed.  The wound is closing up nicely and currently only measures about 1 x 1.5 cm x 0.5 cm.  There is no tracking present.  There is no erythema, induration, or purulent drainage.  There is no fibrinous exudate.  Impression and plan: Carolyn Roy is a 53 year old woman who underwent a lumpectomy and sentinel node biopsy.  She had complications at the breast incision site secondary to a seroma.  She and her significant other are doing an excellent job with her dressing changes and the wound has improved markedly over the months that I have been monitoring her.  Dr. Bary Castilla will be back in the clinic shortly.  I have recommended that she see him prior to 10 February, which is when she is scheduled to see radiation oncology again.

## 2018-03-05 NOTE — Telephone Encounter (Signed)
Pathology sent to Dr[facility] [place] and [facility] for review and follow up recommendations.

## 2018-03-05 NOTE — Patient Instructions (Signed)
Continue packing the area until you cannot get any more material in.   Follow up 2 weeks with Dr Bary Castilla before the 10th.

## 2018-03-08 ENCOUNTER — Other Ambulatory Visit: Payer: Self-pay | Admitting: Pain Medicine

## 2018-03-08 ENCOUNTER — Telehealth: Payer: Self-pay

## 2018-03-08 ENCOUNTER — Other Ambulatory Visit: Payer: Self-pay

## 2018-03-08 ENCOUNTER — Ambulatory Visit: Payer: 59 | Attending: Pain Medicine | Admitting: Pain Medicine

## 2018-03-08 VITALS — BP 104/83 | HR 88 | Temp 97.5°F | Resp 16 | Ht 64.0 in | Wt 178.0 lb

## 2018-03-08 DIAGNOSIS — M25562 Pain in left knee: Secondary | ICD-10-CM | POA: Insufficient documentation

## 2018-03-08 DIAGNOSIS — G620 Drug-induced polyneuropathy: Secondary | ICD-10-CM

## 2018-03-08 DIAGNOSIS — G8929 Other chronic pain: Secondary | ICD-10-CM | POA: Diagnosis present

## 2018-03-08 DIAGNOSIS — M1711 Unilateral primary osteoarthritis, right knee: Secondary | ICD-10-CM | POA: Insufficient documentation

## 2018-03-08 DIAGNOSIS — G5793 Unspecified mononeuropathy of bilateral lower limbs: Secondary | ICD-10-CM

## 2018-03-08 DIAGNOSIS — M25561 Pain in right knee: Secondary | ICD-10-CM | POA: Insufficient documentation

## 2018-03-08 DIAGNOSIS — M792 Neuralgia and neuritis, unspecified: Secondary | ICD-10-CM

## 2018-03-08 DIAGNOSIS — T451X5A Adverse effect of antineoplastic and immunosuppressive drugs, initial encounter: Secondary | ICD-10-CM

## 2018-03-08 MED ORDER — LIDOCAINE HCL (PF) 1 % IJ SOLN
5.0000 mL | Freq: Once | INTRAMUSCULAR | Status: AC
  Administered 2018-03-08: 5 mL

## 2018-03-08 MED ORDER — ROPIVACAINE HCL 2 MG/ML IJ SOLN
2.0000 mL | Freq: Once | INTRAMUSCULAR | Status: AC
  Administered 2018-03-08: 10 mL via INTRA_ARTICULAR

## 2018-03-08 MED ORDER — ROPIVACAINE HCL 2 MG/ML IJ SOLN
INTRAMUSCULAR | Status: AC
  Filled 2018-03-08: qty 10

## 2018-03-08 MED ORDER — METHYLPREDNISOLONE ACETATE 80 MG/ML IJ SUSP
INTRAMUSCULAR | Status: AC
  Filled 2018-03-08: qty 1

## 2018-03-08 MED ORDER — LIDOCAINE HCL (PF) 1 % IJ SOLN
INTRAMUSCULAR | Status: AC
  Filled 2018-03-08: qty 5

## 2018-03-08 MED ORDER — METHYLPREDNISOLONE ACETATE 80 MG/ML IJ SUSP
80.0000 mg | Freq: Once | INTRAMUSCULAR | Status: AC
  Administered 2018-03-08: 80 mg via INTRA_ARTICULAR

## 2018-03-08 NOTE — Progress Notes (Signed)
Safety precautions to be maintained throughout the outpatient stay will include: orient to surroundings, keep bed in low position, maintain call bell within reach at all times, provide assistance with transfer out of bed and ambulation.  

## 2018-03-08 NOTE — Progress Notes (Signed)
Patient's Name: Carolyn Roy  MRN: [id number]  Referring Provider: Milinda Pointer, MD  DOB: May 07, 1965  PCP: Patient, No Pcp Per  DOS: 03/08/2018  Note by: Gaspar Cola, MD  Service setting: Ambulatory outpatient  Specialty: Interventional Pain Management  Patient type: Established  Location: [facility] (AMB) Pain Management Facility  Visit type: Interventional Procedure   Primary Reason for Visit: Interventional Pain Management Treatment. CC: Knee Pain (right)  Procedure:          Anesthesia, Analgesia, Anxiolysis:  Type: Diagnostic Intra-Articular Local anesthetic and steroid Knee Injection #1  Region: Lateral infrapatellar Knee Region Level: Knee Joint Laterality: Right knee  Type: Local Anesthesia Indication(s): Analgesia         Local Anesthetic: Lidocaine 1-2% Route: Infiltration ([place]/IM) IV Access: Declined Sedation: Declined   Position: Sitting   Indications: 1. Osteoarthritis of knee (Right)   2. Chronic knee pain (Secondary Area of Pain) (Bilateral) (R>L)    Pain Score: Pre-procedure: 5 /10 Post-procedure: 0-No pain/10  Pre-op Assessment:  Carolyn Roy is a 54 y.o. (year old), female patient, seen today for interventional treatment. She  has a past surgical history that includes Oophorectomy; Tubal ligation; Portacath placement (Right, 07/14/2017); Axillary lymph node biopsy (Left, 07/14/2017); Breast lumpectomy (Left, 01/12/2018); Breast biopsy (Left); Partial mastectomy with needle localization (Left, 01/12/2018); and Sentinel node biopsy (Left, 01/12/2018). Carolyn Roy has a current medication list which includes the following prescription(s): albuterol, aspirin-acetaminophen-caffeine, fifty50 glucose meter 2.0, calcium carbonate, canagliflozin, vitamin d3, ergocalciferol, gabapentin, glucose blood, basaglar kwikpen, insulin pen needle, lidocaine-prilocaine, magnesium, meloxicam, ondansetron, oxycodone, prochlorperazine, and tramadol, and the following Facility-Administered  Medications: heparin lock flush and sodium chloride flush. Her primarily concern today is the Knee Pain (right)  Initial Vital Signs:  Pulse/HCG Rate: 90  Temp: (!) 97.5 F (36.4 C) Resp: 16 BP: 103/69 SpO2: 98 %  BMI: Estimated body mass index is 30.55 kg/m as calculated from the following:   Height as of this encounter: 5\' 4"  (1.626 m).   Weight as of this encounter: 178 lb (80.7 kg).  Risk Assessment: Allergies: Reviewed. She is allergic to aspirin.  Allergy Precautions: None required Coagulopathies: Reviewed. None identified.  Blood-thinner therapy: None at this time Active Infection(s): Reviewed. None identified. Carolyn Roy is afebrile  Site Confirmation: Carolyn Roy was asked to confirm the procedure and laterality before marking the site Procedure checklist: Completed Consent: Before the procedure and under the influence of no sedative(s), amnesic(s), or anxiolytics, the patient was informed of the treatment options, risks and possible complications. To fulfill our ethical and legal obligations, as recommended by the American Medical Association's Code of Ethics, I have informed the patient of my clinical impression; the nature and purpose of the treatment or procedure; the risks, benefits, and possible complications of the intervention; the alternatives, including doing nothing; the risk(s) and benefit(s) of the alternative treatment(s) or procedure(s); and the risk(s) and benefit(s) of doing nothing. The patient was provided information about the general risks and possible complications associated with the procedure. These may include, but are not limited to: failure to achieve desired goals, infection, bleeding, organ or nerve damage, allergic reactions, paralysis, and death. In addition, the patient was informed of those risks and complications associated to the procedure, such as failure to decrease pain; infection; bleeding; organ or nerve damage with subsequent damage to sensory,  motor, and/or autonomic systems, resulting in permanent pain, numbness, and/or weakness of one or several areas of the body; allergic reactions; (i.e.: anaphylactic reaction); and/or death. Furthermore,  the patient was informed of those risks and complications associated with the medications. These include, but are not limited to: allergic reactions (i.e.: anaphylactic or anaphylactoid reaction(s)); adrenal axis suppression; blood sugar elevation that in diabetics may result in ketoacidosis or comma; water retention that in patients with history of congestive heart failure may result in shortness of breath, pulmonary edema, and decompensation with resultant heart failure; weight gain; swelling or edema; medication-induced neural toxicity; particulate matter embolism and blood vessel occlusion with resultant organ, and/or nervous system infarction; and/or aseptic necrosis of one or more joints. Finally, the patient was informed that Medicine is not an exact science; therefore, there is also the possibility of unforeseen or unpredictable risks and/or possible complications that may result in a catastrophic outcome. The patient indicated having understood very clearly. We have given the patient no guarantees and we have made no promises. Enough time was given to the patient to ask questions, all of which were answered to the patient's satisfaction. Carolyn Roy has indicated that she wanted to continue with the procedure. Attestation: I, the ordering provider, attest that I have discussed with the patient the benefits, risks, side-effects, alternatives, likelihood of achieving goals, and potential problems during recovery for the procedure that I have provided informed consent. Date  Time: 03/08/2018  9:26 AM  Pre-Procedure Preparation:  Monitoring: As per clinic protocol. Respiration, ETCO2, SpO2, BP, heart rate and rhythm monitor placed and checked for adequate function Safety Precautions: Patient was assessed  for positional comfort and pressure points before starting the procedure. Time-out: I initiated and conducted the "Time-out" before starting the procedure, as per protocol. The patient was asked to participate by confirming the accuracy of the "Time Out" information. Verification of the correct person, site, and procedure were performed and confirmed by me, the nursing staff, and the patient. "Time-out" conducted as per Joint Commission's Universal Protocol (UP.01.01.01). Time: 0956  Description of Procedure:          Target Area: Knee Joint Approach: Just above the Lateral tibial plateau, lateral to the infrapatellar tendon. Area Prepped: Entire knee area, from the mid-thigh to the mid-shin. Prepping solution: ChloraPrep (2% chlorhexidine gluconate and 70% isopropyl alcohol) Safety Precautions: Aspiration looking for blood return was conducted prior to all injections. At no point did we inject any substances, as a needle was being advanced. No attempts were made at seeking any paresthesias. Safe injection practices and needle disposal techniques used. Medications properly checked for expiration dates. SDV (single dose vial) medications used. Description of the Procedure: Protocol guidelines were followed. The patient was placed in position over the fluoroscopy table. The target area was identified and the area prepped in the usual manner. Skin & deeper tissues infiltrated with local anesthetic. Appropriate amount of time allowed to pass for local anesthetics to take effect. The procedure needles were then advanced to the target area. Proper needle placement secured. Negative aspiration confirmed. Solution injected in intermittent fashion, asking for systemic symptoms every 0.5cc of injectate. The needles were then removed and the area cleansed, making sure to leave some of the prepping solution back to take advantage of its long term bactericidal properties. Vitals:   03/08/18 0921 03/08/18 1000  BP:  103/69 104/83  Pulse: 90 88  Resp: 16 16  Temp: (!) 97.5 F (36.4 C)   TempSrc: Oral   SpO2: 98% 98%  Weight: 178 lb (80.7 kg)   Height: 5\' 4"  (1.626 m)     Start Time: 0956 hrs. End Time: 0956 hrs.  Materials:  Needle(s) Type: Regular needle Gauge: 25G Length: 1.5-in Medication(s): Please see orders for medications and dosing details.  Imaging Guidance:          Type of Imaging Technique: None used Indication(s): N/A Exposure Time: No patient exposure Contrast: None used. Fluoroscopic Guidance: N/A Ultrasound Guidance: N/A Interpretation: N/A  Antibiotic Prophylaxis:   Anti-infectives (From admission, onward)   None     Indication(s): None identified  Post-operative Assessment:  Post-procedure Vital Signs:  Pulse/HCG Rate: 88  Temp: (!) 97.5 F (36.4 C) Resp: 16 BP: 104/83 SpO2: 98 %  EBL: None  Complications: No immediate post-treatment complications observed by team, or reported by patient.  Note: The patient tolerated the entire procedure well. A repeat set of vitals were taken after the procedure and the patient was kept under observation following institutional policy, for this type of procedure. Post-procedural neurological assessment was performed, showing return to baseline, prior to discharge. The patient was provided with post-procedure discharge instructions, including a section on how to identify potential problems. Should any problems arise concerning this procedure, the patient was given instructions to immediately contact us, at any time, without hesitation. In any case, we plan to contact the patient by telephone for a follow-up status report regarding this interventional procedure.  Comments:  No additional relevant information.  Plan of Care   Imaging Orders  No imaging studies ordered today    Procedure Orders     KNEE INJECTION  Medications ordered for procedure: Meds ordered this encounter  Medications  . lidocaine (PF) (XYLOCAINE)  1 % injection 5 mL  . ropivacaine (PF) 2 mg/mL (0.2%) (NAROPIN) injection 2 mL  . methylPREDNISolone acetate (DEPO-MEDROL) injection 80 mg   Medications administered: We administered lidocaine (PF), ropivacaine (PF) 2 mg/mL (0.2%), and methylPREDNISolone acetate.  See the medical record for exact dosing, route, and time of administration.  Disposition: Discharge home  Discharge Date & Time: 03/08/2018; 1001 hrs.   Physician-requested Follow-up: Return for post-procedure eval (2 wks), w/ Dr. Dossie Arbour.  Future Appointments  Date Time Provider [place]  03/13/2018 10:15 AM Robert Bellow, MD [facility] None  03/14/2018  8:30 AM Milinda Pointer, MD [facility] None  03/19/2018 10:00 AM [facility] None  03/28/2018  9:15 AM Milinda Pointer, MD [facility] None   Primary Care Physician: Patient, No Pcp Per Location: [facility] Note by: Gaspar Cola, MD Date: 03/08/2018; Time: 10:26 AM  Disclaimer:  Medicine is not an Chief Strategy Officer. The only guarantee in medicine is that nothing is guaranteed. It is important to note that the decision to proceed with this intervention was based on the information collected from the patient. The Data and conclusions were drawn from the patient's questionnaire, the interview, and the physical examination. Because the information was provided in large part by the patient, it cannot be guaranteed that it has not been purposely or unconsciously manipulated. Every effort has been made to obtain as much relevant data as possible for this evaluation. It is important to note that the conclusions that lead to this procedure are derived in large part from the available data. Always take into account that the treatment will also be dependent on availability of resources and existing treatment guidelines, considered by other Pain Management Practitioners as being common knowledge and practice, at the time of the  intervention. For Medico-Legal purposes, it is also important to point out that variation in procedural techniques and pharmacological choices are the acceptable norm. The  indications, contraindications, technique, and results of the above procedure should only be interpreted and judged by a Board-Certified Interventional Pain Specialist with extensive familiarity and expertise in the same exact procedure and technique.

## 2018-03-08 NOTE — Patient Instructions (Addendum)
____________________________________________________________________________________________  Post-Procedure Discharge Instructions  Instructions:  Apply ice: Fill a plastic sandwich bag with crushed ice. Cover it with a small towel and apply to injection site. Apply for 15 minutes then remove x 15 minutes. Repeat sequence on day of procedure, until you go to bed. The purpose is to minimize swelling and discomfort after procedure.  Apply heat: Apply heat to procedure site starting the day following the procedure. The purpose is to treat any soreness and discomfort from the procedure.  Food intake: Start with clear liquids (like water) and advance to regular food, as tolerated.   Physical activities: Keep activities to a minimum for the first 8 hours after the procedure.   Driving: If you have received any sedation, you are not allowed to drive for 24 hours after your procedure.  Blood thinner: Restart your blood thinner 6 hours after your procedure. (Only for those taking blood thinners)  Insulin: As soon as you can eat, you may resume your normal dosing schedule. (Only for those taking insulin)  Infection prevention: Keep procedure site clean and dry.  Post-procedure Pain Diary: Extremely important that this be done correctly and accurately. Recorded information will be used to determine the next step in treatment.  Pain evaluated is that of treated area only. Do not include pain from an untreated area.  Complete every hour, on the hour, for the initial 8 hours. Set an alarm to help you do this part accurately.  Do not go to sleep and have it completed later. It will not be accurate.  Follow-up appointment: Keep your follow-up appointment after the procedure. Usually 2 weeks for most procedures. (6 weeks in the case of radiofrequency.) Bring you pain diary.   Expect:  From numbing medicine (AKA: Local Anesthetics): Numbness or decrease in pain.  Onset: Full effect within 15  minutes of injected.  Duration: It will depend on the type of local anesthetic used. On the average, 1 to 8 hours.   From steroids: Decrease in swelling or inflammation. Once inflammation is improved, relief of the pain will follow.  Onset of benefits: Depends on the amount of swelling present. The more swelling, the longer it will take for the benefits to be seen. In some cases, up to 10 days.  Duration: Steroids will stay in the system x 2 weeks. Duration of benefits will depend on multiple posibilities including persistent irritating factors.  Occasional side-effects: Facial flushing (red, warm cheeks) , cramps (if present, drink Gatorade and take over-the-counter Magnesium 450-500 mg once to twice a day).  From procedure: Some discomfort is to be expected once the numbing medicine wears off. This should be minimal if ice and heat are applied as instructed.  Call if:  You experience numbness and weakness that gets worse with time, as opposed to wearing off.  New onset bowel or bladder incontinence. (This applies to Spinal procedures only)  Emergency Numbers:  Durning business hours (Monday - Thursday, 8:00 AM - 4:00 PM) (Friday, 9:00 AM - 12:00 Noon): [phone]  After hours: [phone] ____________________________________________________________________________________________   Post-procedure Information What to expect: Most procedures involve the use of a local anesthetic (numbing medicine), and a steroid (anti-inflammatory medicine).  The local anesthetics may cause temporary numbness and weakness of the legs or arms, depending on the location of the block. This numbness/weakness may last 4-6 hours, depending on the local anesthetic used. In rare instances, it can last up to 24 hours. While numb, you must be very careful not to  injure the extremity.  After any procedure, you could expect the pain to get better within 15-20 minutes. This relief is temporary and may  last 4-6 hours. Once the local anesthetics wears off, you could experience discomfort, possibly more than usual, for up to 10 (ten) days. In the case of radiofrequencies, it may last up to 6 weeks. Surgeries may take up to 8 weeks for the healing process. The discomfort is due to the irritation caused by needles going through skin and muscle. To minimize the discomfort, we recommend using ice the first day, and heat from then on. The ice should be applied for 15 minutes on, and 15 minutes off. Keep repeating this cycle until bedtime. Avoid applying the ice directly to the skin, to prevent frostbite. Heat should be used daily, until the pain improves (4-10 days). Be careful not to burn yourself.  Occasionally you may experience muscle spasms or cramps. These occur as a consequence of the irritation caused by the needle sticks to the muscle and the blood that will inevitably be lost into the surrounding muscle tissue. Blood tends to be very irritating to tissues, which tend to react by going into spasm. These spasms may start the same day of your procedure, but they may also take days to develop. This late onset type of spasm or cramp is usually caused by electrolyte imbalances triggered by the steroids, at the level of the kidney. Cramps and spasms tend to respond well to muscle relaxants, multivitamins (some are triggered by the procedure, but may have their origins in vitamin deficiencies), and "Gatorade", or any sports drinks that can replenish any electrolyte imbalances. (If you are a diabetic, ask your pharmacist to get you a sugar-free brand.) Warm showers or baths may also be helpful. Stretching exercises are highly recommended. General Instructions:  Be alert for signs of possible infection: redness, swelling, heat, red streaks, elevated temperature, and/or fever. These typically appear 4 to 6 days after the procedure. Immediately notify your doctor if you experience unusual bleeding, difficulty  breathing, or loss of bowel or bladder control. If you experience increased pain, do not increase your pain medicine intake, unless instructed by your pain physician. Post-Procedure Care:  Be careful in moving about. Muscle spasms in the area of the injection may occur. Applying ice or heat to the area is often helpful. The incidence of spinal headaches after epidural injections ranges between 1.4% and 6%. If you develop a headache that does not seem to respond to conservative therapy, please let your physician know. This can be treated with an epidural blood patch.   Post-procedure numbness or redness is to be expected, however it should average 4 to 6 hours. If numbness and weakness of your extremities begins to develop 4 to 6 hours after your procedure, and is felt to be progressing and worsening, immediately contact your physician.   Diet:  If you experience nausea, do not eat until this sensation goes away. If you had a "Stellate Ganglion Block" for upper extremity "Reflex Sympathetic Dystrophy", do not eat or drink until your hoarseness goes away. In any case, always start with liquids first and if you tolerate them well, then slowly progress to more solid foods. Activity:  For the first 4 to 6 hours after the procedure, use caution in moving about as you may experience numbness and/or weakness. Use caution in cooking, using household electrical appliances, and climbing steps. If you need to reach your Doctor call our office: [phone] Monday-Thursday 8:00  am - 4:00 PM    Fridays: Closed     In case of an emergency: In case of emergency, call 911 or go to the nearest emergency room and have the physician there call us.  Interpretation of Procedure Every nerve block has two components: a diagnostic component, and a treatment component. Unrealistic expectations are the most common causes of "perceived failure".  In a perfect world, a single nerve block should be able to completely and  permanently eliminate the pain. Sadly, the world is not perfect.  Most pain management nerve blocks are performed using local anesthetics and steroids. Steroids are responsible for any long-term benefit that you may experience. Their purpose is to decrease any chronic swelling that may exist in the area. Steroids begin to work immediately after being injected. However, most patients will not experience any benefits until 5 to 10 days after the injection, when the swelling has come down to the point where they can tell a difference. Steroids will only help if there is swelling to be treated. As such, they can assist with the diagnosis. If effective, they suggest an inflammatory component to the pain, and if ineffective, they rule out inflammation as the main cause or component of the problem. If the problem is one of mechanical compression, you will get no benefit from those steroids.   In the case of local anesthetics, they have a crucial role in the diagnosis of your condition. Most will begin to work within15 to 20 minutes after injection. The duration will depend on the type used (short- vs. Long-acting). It is of outmost importance that patients keep tract of their pain, after the procedure. To assist with this matter, a "Post-procedure Pain Diary" is provided. Make sure to complete it and to bring it back to your follow-up appointment.  As long as the patient keeps accurate, detailed records of their symptoms after every procedure, and returns to have those interpreted, every procedure will provide [place] with invaluable information. Even a block that does not provide the patient with any relief, will always provide [place] with information about the mechanism and the origin of the pain. The only time a nerve block can be considered a waste of time is when patients do not keep track of the results, or do not keep their post-procedure appointment.  Reporting the results back to your physician The Pain  Score  Pain is a subjective complaint. It cannot be seen, touched, or measured. We depend entirely on the patient's report of the pain in order to assess your condition and treatment. To evaluate the pain, we use a pain scale, where "0" means "No Pain", and a "10" is "the worst possible pain that you can even imagine" (i.e. something like been eaten alive by a shark or being torn apart by a lion).   You will frequently be asked to rate your pain. Please be as accurate, remember that medical decisions will be based on your responses. Please do not rate your pain above a 10. Doing so is actually interpreted as "symptom magnification" (exaggeration), as well as lack of understanding with regards to the scale. To put this into perspective, when you tell us that your pain is at a 10 (ten), what you are saying is that there is nothing we can do to make this pain any worse. (Carefully think about that.)

## 2018-03-08 NOTE — Telephone Encounter (Signed)
Dr. Bradd Canary have reviewed results of recent biopsies. She can return to clinic in 3 months. Call placed to Ms. White. No answer. Left voicemail to return call.

## 2018-03-09 ENCOUNTER — Telehealth: Payer: Self-pay

## 2018-03-09 IMAGING — US ULTRASOUND LEFT BREAST LIMITED
1 series · 13 of 22 positions shown · non-contrast
Comparison: Previous exam(s).

CLINICAL DATA: Palpable lump within the LEFT breast.History of
probably benign RIGHT breast cyst.

EXAM:
DIGITAL DIAGNOSTIC BILATERAL MAMMOGRAM WITH CAD AND TOMO
ULTRASOUND BILATERAL BREAST

[Series 1: ultrasound left breast limited · 0.09mm/px · 13 of 22 slices shown]
[im 1/22]
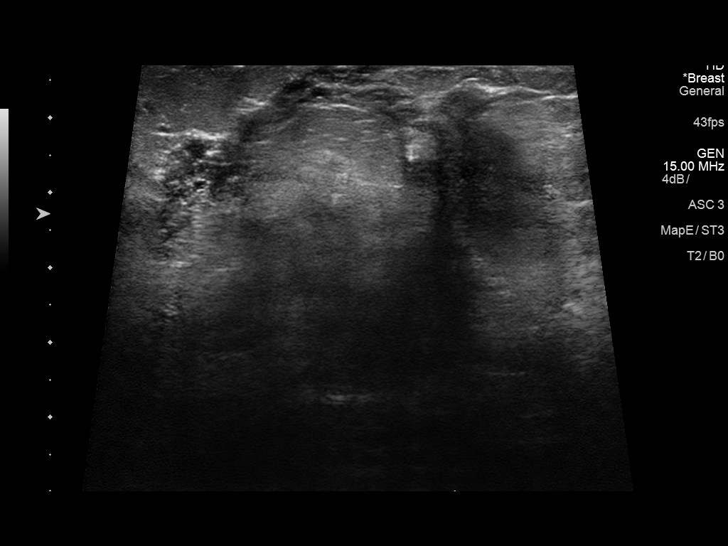
[im 3/22]
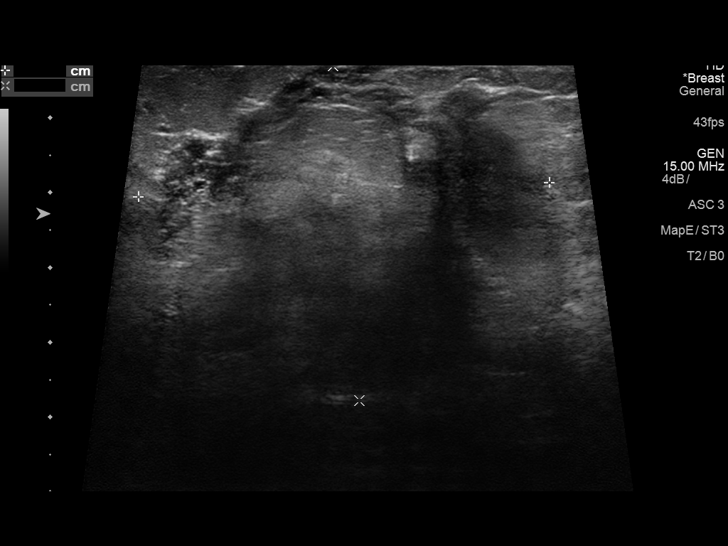
[im 5/22]
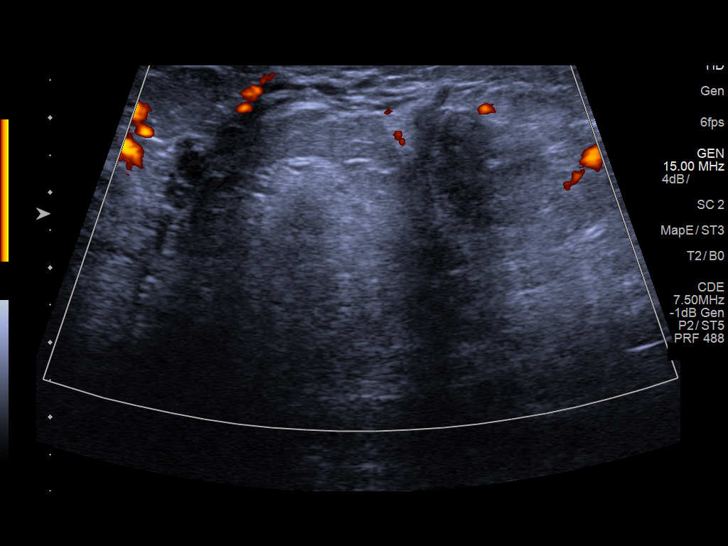
[im 6/22]
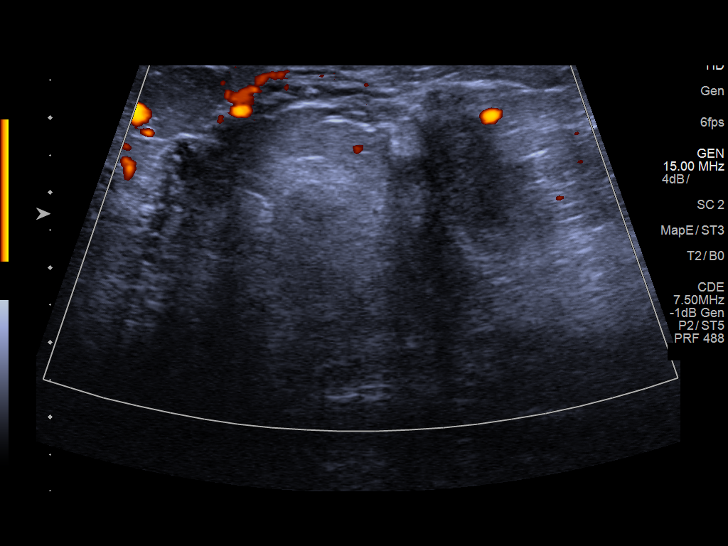
[im 8/22]
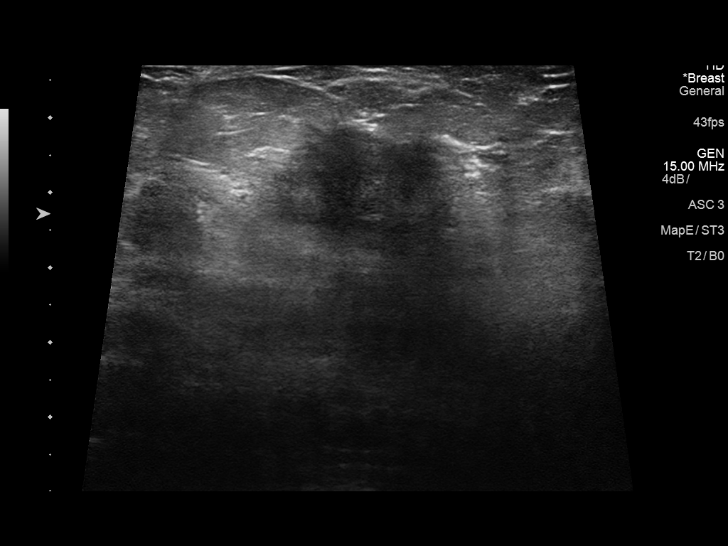
[im 10/22]
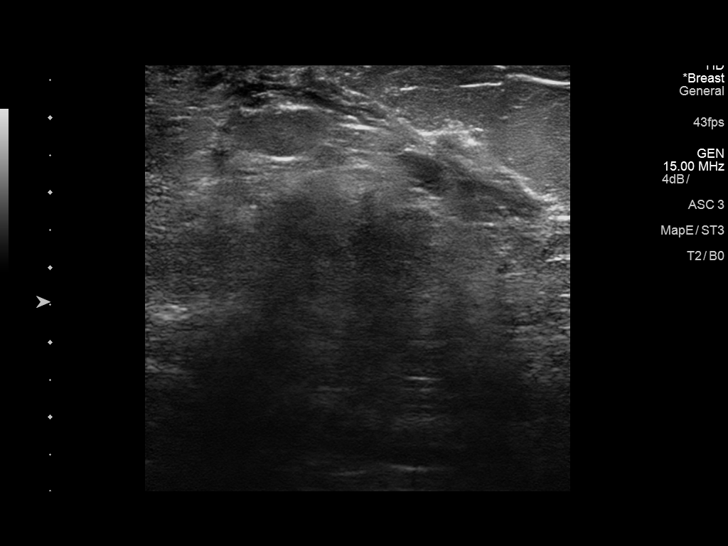
[im 12/22]
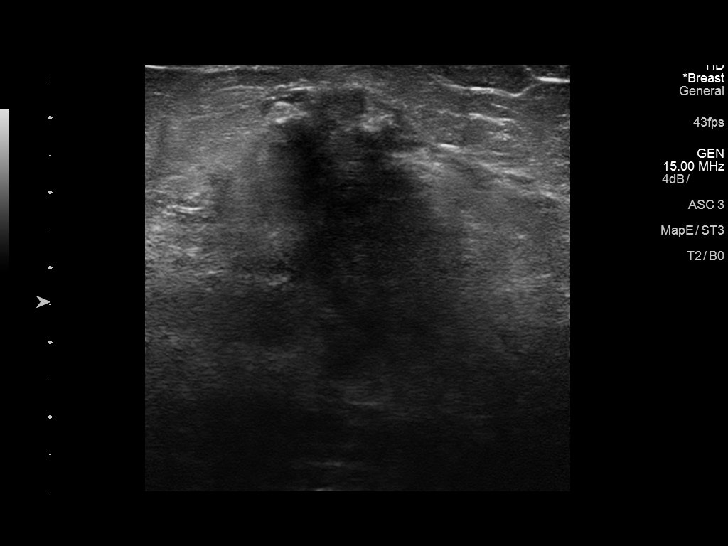
[im 13/22]
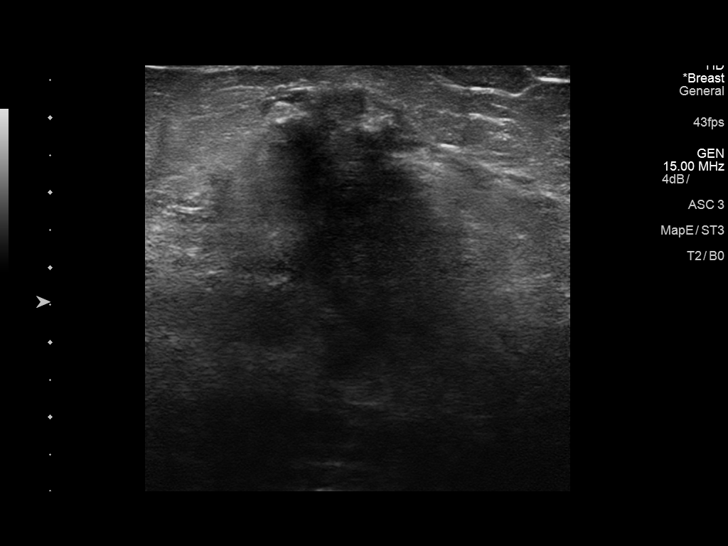
[im 15/22]
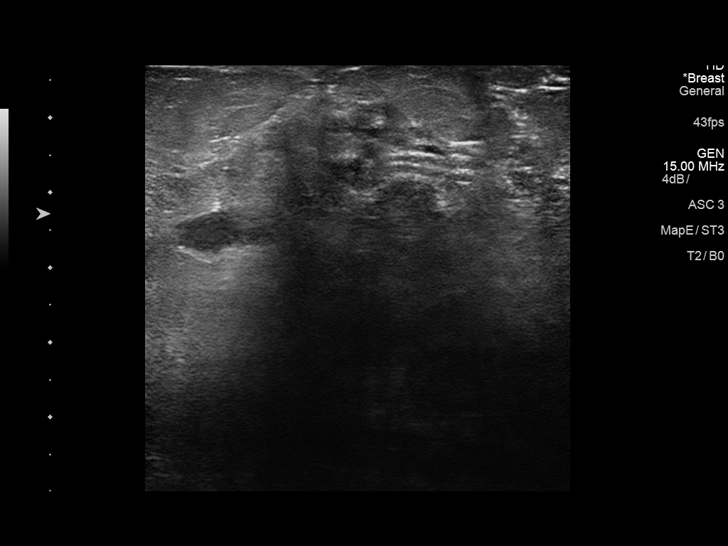
[im 17/22]
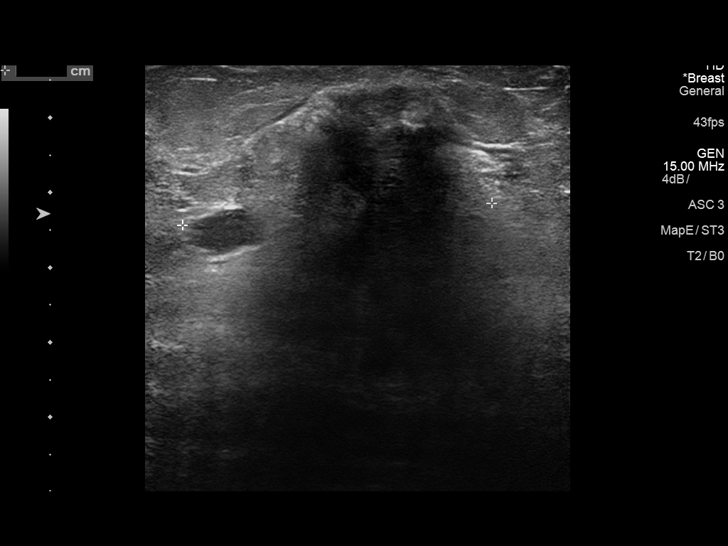
[im 18/22]
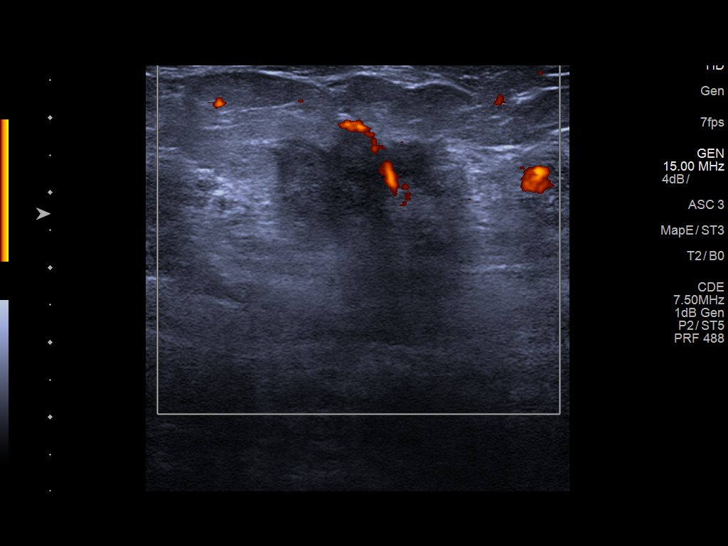
[im 20/22]
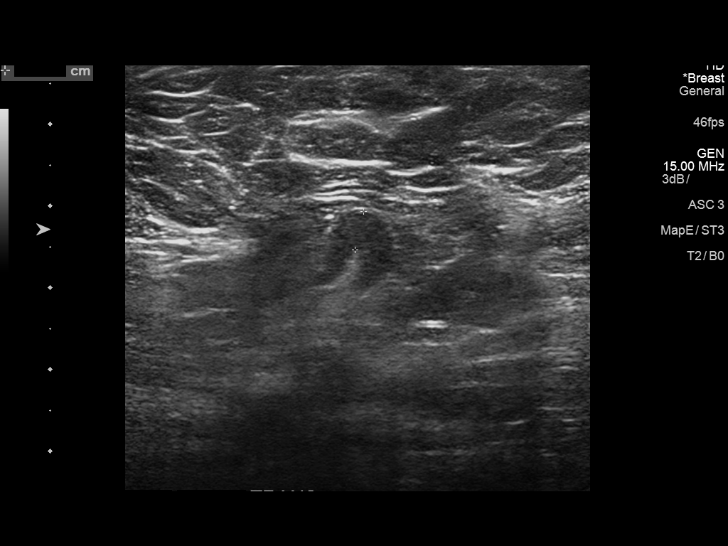
[im 22/22]
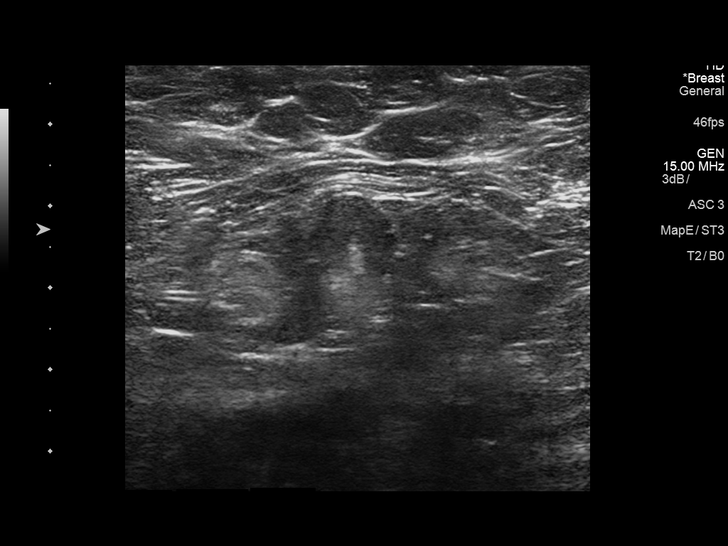

[13 of 22 positions shown; findings below may reference images not displayed]

ACR Breast Density Category c: The breast tissue is heterogeneously
dense, which may obscure small masses.
FINDINGS: LEFT breast: There is an irregular mass with associated
architectural distortion within the retroareolar LEFT breast, middle
depth, centered at the 3-4 o'clock axis, dominant component
measuring approximately 3.5 cm greatest dimension, probable
surrounding satellite masses extending the overall measurement to
approximately 5.5 cm greatest dimension.

RIGHT breast: There are no new dominant masses, suspicious
calcifications or secondary signs of malignancy identified within
the RIGHT breast.

Mammographic images were processed with CAD.

LEFT breast: Targeted ultrasound is performed, showing irregular
hypoechoic mass within the retroareolar LEFT breast, 3 o'clock axis,
dominant component measuring 3 cm, combination of mass and
surrounding smaller satellite masses measuring up to 5.5 cm greatest
dimension, corresponding to the mammographic finding and palpable
lump.

RIGHT breast: Targeted ultrasound is performed, evaluating the area
of previously demonstrated probably benign cyst in the upper-outer
quadrant of the RIGHT breast, showing scattered benign cysts in the
upper-outer quadrant of the RIGHT breast. No suspicious solid or
cystic mass is seen.

LEFT axilla was evaluated with ultrasound showing a single
morphologically abnormal lymph node with thickened cortex measuring
5 mm.
IMPRESSION: 1. Highly suspicious mass within the retroareolar LEFT breast,
dominant component measuring 3 cm, combination of dominant mass and
surrounding smaller satellite masses measuring 5.5 cm greatest
dimension, corresponding to the palpable lump. Ultrasound-guided
biopsy is recommended.
2. Single morphologically abnormal lymph node within the LEFT
axilla, with thickened cortex measuring 5 mm. Ultrasound-guided
biopsy is recommended.
3. No evidence of malignancy within the RIGHT breast.

RECOMMENDATION:
1. Ultrasound-guided biopsy of the retroareolar LEFT breast mass,
centered at the 3 o'clock axis.
2. Ultrasound-guided biopsy of the single morphologically abnormal
lymph node in the LEFT axilla.

Ordering physician will be contacted with today's results and
patient will then be scheduled for ultrasound-guided biopsies at her
earliest convenience.

I have discussed the findings and recommendations with the patient.
Results were also provided in writing at the conclusion of the
visit. If applicable, a reminder letter will be sent to the patient
regarding the next appointment.

BI-RADS CATEGORY  5: Highly suggestive of malignancy.

## 2018-03-09 NOTE — Telephone Encounter (Signed)
Post procedure phone call.  LM 

## 2018-03-12 ENCOUNTER — Ambulatory Visit: Payer: No Typology Code available for payment source

## 2018-03-13 ENCOUNTER — Telehealth: Payer: Self-pay

## 2018-03-13 ENCOUNTER — Ambulatory Visit: Payer: No Typology Code available for payment source

## 2018-03-13 ENCOUNTER — Other Ambulatory Visit: Payer: Self-pay

## 2018-03-13 ENCOUNTER — Ambulatory Visit (INDEPENDENT_AMBULATORY_CARE_PROVIDER_SITE_OTHER): Payer: 59 | Admitting: General Surgery

## 2018-03-13 ENCOUNTER — Encounter: Payer: Self-pay | Admitting: General Surgery

## 2018-03-13 VITALS — BP 104/69 | HR 92 | Temp 97.3°F | Resp 16 | Ht 64.0 in | Wt 177.6 lb

## 2018-03-13 DIAGNOSIS — Z170 Estrogen receptor positive status [ER+]: Secondary | ICD-10-CM

## 2018-03-13 DIAGNOSIS — C50512 Malignant neoplasm of lower-outer quadrant of left female breast: Secondary | ICD-10-CM

## 2018-03-13 NOTE — Telephone Encounter (Signed)
[name] returned call. We went over recent biopsy results. All questions answered. Follow up made in 3 months per Dr. Bradd Canary.  SPECIMEN SUBMITTED:  A. Cervical bx   CLINICAL HISTORY:  53 year old female undergoing radiation therapy for breast carcinoma;  cervical polyp and thickened endometrial stripe (6 mm with complex  heterogenous appearance)   PRE-OPERATIVE DIAGNOSIS:  Endometrial thickening and cervical polyp   POST-OPERATIVE DIAGNOSIS:  Polyp in the canal was removed almost completely with cervical biopsy  forceps     DIAGNOSIS:  A. 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  - ACUTELY INFLAMED ENDOCERVICAL MUCOSA WITH REACTIVE CHANGES AND  SQUAMOUS METAPLASIA.  - NEGATIVE FOR ATYPIA / EIN AND MALIGNANCY.  - DEEPER SECTIONS WERE EXAMINED.   Comment:  The patient's prior cervical biopsy specimen ([id number]) was reviewed  in conjunction with this case.  SPECIMEN SUBMITTED:  A. Endometrial biopsy   CLINICAL HISTORY:  53 year old female undergoing radiation therapy for breast carcinoma;  cervical polyp and thickened endometrial stripe (6 mm with complex  heterogenous appearance)   PRE-OPERATIVE DIAGNOSIS:  Endometrial thickening and cervical polyp   POST-OPERATIVE DIAGNOSIS:  No appreciable tissue obtained with aspiration.      DIAGNOSIS:  A. ENDOMETRIAL BIOPSY:  - SCANT FRAGMENTS OF ATROPHIC ENDOMETRIUM.  - DEEPER SECTIONS EXAMINED.   Comment:  Atypia / EIN and malignancy are not identified in this limited sample.  Correlation with clinical and radiographic findings is required.

## 2018-03-13 NOTE — Progress Notes (Signed)
Patient's Name: Carolyn Roy  MRN: [id number]  Referring Provider: No ref. provider found  DOB: 1966/01/12  PCP: Patient, No Pcp Per  DOS: 03/14/2018  Note by: Gaspar Cola, MD  Service setting: Ambulatory outpatient  Specialty: Interventional Pain Management  Location: [facility] (AMB) Pain Management Facility    Patient type: Established   Primary Reason(s) for Visit: Encounter for post-procedure evaluation of chronic illness with mild to moderate exacerbation CC: Leg Pain (left)  HPI  Carolyn Roy is a 53 y.o. year old, female patient, who comes today for a post-procedure evaluation. She has Poorly controlled type 2 diabetes mellitus ([place]); Chronic fatigue; Severe depression ([place]); Knee pain, left; Tobacco abuse; Depression, major, single episode, severe ([place]); Hypercholesterolemia; Stage 1 mild COPD by GOLD classification ([place]); Cancer of midline of breast, left ([place]); Family history of breast cancer; Cervical polyp; Goals of care, counseling/discussion; Encounter for antineoplastic chemotherapy; Hyperglycemia; Chemotherapy-induced neutropenia ([place]); Chemotherapy-induced neuropathy ([place]); Malignant neoplasm of lower-outer quadrant of left breast of female, estrogen receptor positive ([place]); Burning sensation of feet (Bilateral); Numbness and tingling in hands (Bilateral); Chronic feet pain (Primary Area of Pain) (Bilateral) (R>L); Neuropathic pain of feet (Bilateral); Chronic knee pain (Secondary Area of Pain) (Bilateral) (R>L); Chronic hand pain (Tertiary Area of Pain) (Bilateral) (R>L); Chronic pain syndrome; Pharmacologic therapy; Disorder of skeletal system; Problems influencing health status; Chronic hip pain (Fourth Area of Pain) (Bilateral) (R>L); Type 2 diabetes mellitus with hyperglycemia, with long-term current use of insulin ([place]); Chronic upper extremity pain (Tertiary Area of Pain) (Bilateral) (R>L); Cancer-related pain; Vitamin D deficiency; Neuropathic pain; Osteoarthritis of knee  (Right); Chronic low back pain (Bilateral (L>R) w/o sciatica; Chronic hip pain (Left); Lumbar facet syndrome (Bilateral) (L>R); Idiopathic scoliosis; Chronic sacroiliac joint pain (Left); Chronic pain of knee (Right); and History of breast cancer on their problem list. Her primarily concern today is the Leg Pain (left)  Pain Assessment: Location: Left Leg Radiating: pain radiaties down left side Onset: More than a month ago Duration: Chronic pain Quality: Dull, Sharp, Numbness, Pressure Severity: 4 /10 (subjective, self-reported pain score)  Note: Reported level is compatible with observation.                         When using our objective Pain Scale, levels between 6 and 10/10 are said to belong in an emergency room, as it progressively worsens from a 6/10, described as severely limiting, requiring emergency care not usually available at an outpatient pain management facility. At a 6/10 level, communication becomes difficult and requires great effort. Assistance to reach the emergency department may be required. Facial flushing and profuse sweating along with potentially dangerous increases in heart rate and blood pressure will be evident. Effect on ADL: pain slow  me down Timing: Intermittent Modifying factors: sit down BP: 106/73  HR: 92  Carolyn Roy comes in today for post-procedure evaluation.  The patient comes into the clinic today referring that she obtain excellent relief of her knee pain from the intra-articular injection.  However, she still having some problems that would appear to be secondary to instability of the knee.  Therefore, I will be ordering an MRI to look at the ligaments and possible causes of this instability.  In addition, today the patient has complained of pain in the lower back but the left side being worse than the right.  Physical exam today was positive for bilateral lumbar facet arthropathy, left-sided sacroiliac joint arthropathy, and left-sided hip joint  arthralgia.  Today I will be ordering some x-rays to evaluate those areas.  Of concern is the fact that the patient does have a history of breast cancer.  Further details on both, my assessment(s), as well as the proposed treatment plan, please see below.  Post-Procedure Assessment  03/08/2018 Procedure: Diagnostic right intra-articular knee joint injection #1 with local anesthetic and steroid Pre-procedure pain score:  5/10 Post-procedure pain score: 0/10 (100% relief) Influential Factors: BMI: 30.38 kg/m Intra-procedural challenges: None observed.         Assessment challenges: None detected.              Reported side-effects: None.        Post-procedural adverse reactions or complications: None reported         Sedation: No sedation used. When no sedatives are used, the analgesic levels obtained are directly associated to the effectiveness of the local anesthetics. However, when sedation is provided, the level of analgesia obtained during the initial 1 hour following the intervention, is believed to be the result of a combination of factors. These factors may include, but are not limited to: 1. The effectiveness of the local anesthetics used. 2. The effects of the analgesic(s) and/or anxiolytic(s) used. 3. The degree of discomfort experienced by the patient at the time of the procedure. 4. The patients ability and reliability in recalling and recording the events. 5. The presence and influence of possible secondary gains and/or psychosocial factors. Reported result: Relief experienced during the 1st hour after the procedure: 100 % (Ultra-Short Term Relief)            Interpretative annotation: Clinically appropriate result. Analgesia during this period is likely to be Local Anesthetic and/or IV Sedative (Analgesic/Anxiolytic) related.          Effects of local anesthetic: The analgesic effects attained during this period are directly associated to the localized infiltration of local  anesthetics and therefore cary significant diagnostic value as to the etiological location, or anatomical origin, of the pain. Expected duration of relief is directly dependent on the pharmacodynamics of the local anesthetic used. Long-acting (4-6 hours) anesthetics used.  Reported result: Relief during the next 4 to 6 hour after the procedure: 100 % (Short-Term Relief)            Interpretative annotation: Clinically appropriate result. Analgesia during this period is likely to be Local Anesthetic-related.          Long-term benefit: Defined as the period of time past the expected duration of local anesthetics (1 hour for short-acting and 4-6 hours for long-acting). With the possible exception of prolonged sympathetic blockade from the local anesthetics, benefits during this period are typically attributed to, or associated with, other factors such as analgesic sensory neuropraxia, antiinflammatory effects, or beneficial biochemical changes provided by agents other than the local anesthetics.  Reported result: Extended relief following procedure: 100 % (Long-Term Relief)            Interpretative annotation: Clinically possible results. Good relief. No permanent benefit expected. Inflammation plays a part in the etiology to the pain. Etiology is likely inflammatory and mechanical.  Current benefits: Defined as reported results that persistent at this point in time.   Analgesia: 75-100 % Carolyn Roy reports improvement of arthralgia.  However, she still experiencing problems with instability of the knee followed by sharp pains when it occurs. Function: Carolyn Roy reports improvement in function ROM: Carolyn Roy reports improvement in ROM Interpretative annotation: Ongoing benefit. Therapeutic success. Effective therapeutic approach.  Benefit could be steroid-related.  Interpretation: Results would suggest a successful diagnostic intervention.                  Plan:  Because of the apparent instability  symptoms, we will be ordering an MRI of her knee.                Laboratory Chemistry  Inflammation Markers (CRP: Acute Phase) (ESR: Chronic Phase) Lab Results  Component Value Date   CRP 7 01/22/2018   ESRSEDRATE 27 01/22/2018                         Rheumatology Markers Lab Results  Component Value Date   LABURIC 3.3 03/30/2015                        Renal Markers Lab Results  Component Value Date   BUN 9 02/02/2018   CREATININE 0.46 02/02/2018   BCR 16 01/22/2018   GFRAA >60 02/02/2018   GFRNONAA >60 02/02/2018                             Hepatic Markers Lab Results  Component Value Date   AST 15 02/02/2018   ALT 14 02/02/2018   ALBUMIN 3.9 02/02/2018                        Neuropathy Markers Lab Results  Component Value Date   VITAMINB12 637 12/13/2017   HGBA1C 8.8 (H) 06/02/2017   HIV Non Reactive 07/01/2015                        Hematology Parameters Lab Results  Component Value Date   PLT 261 02/02/2018   HGB 14.1 02/02/2018   HCT 43.5 02/02/2018                        CV Markers No results found.  Note: Lab results reviewed.  Recent Imaging Results   Results for orders placed during the hospital encounter of 07/14/17  DG C-Arm 1-60 Min-No Report   Narrative Fluoroscopy was utilized by the requesting physician.  No radiographic  interpretation.    Interpretation Report: Fluoroscopy was used during the procedure to assist with needle guidance. The images were interpreted intraoperatively by the requesting physician.  Meds   Current Outpatient Medications:  .  albuterol (PROVENTIL HFA;VENTOLIN HFA) 108 (90 Base) MCG/ACT inhaler, Inhale 2 puffs into the lungs every 6 (six) hours as needed for wheezing or shortness of breath., Disp: 1 Inhaler, Rfl: 0 .  aspirin-acetaminophen-caffeine (EXCEDRIN MIGRAINE) 250-250-65 MG tablet, Take 2 tablets by mouth daily as needed for headache., Disp: , Rfl:  .  Blood Glucose Monitoring Suppl (FIFTY50 GLUCOSE  METER 2.0) w/Device KIT, Use as directed, Disp: , Rfl:  .  calcium carbonate (CALCIUM 600) 600 MG TABS tablet, Take 1 tablet (600 mg total) by mouth 2 (two) times daily with a meal., Disp: 60 tablet, Rfl: 0 .  canagliflozin (INVOKANA) 300 MG TABS tablet, Take 300 mg by mouth daily., Disp: , Rfl:  .  Cholecalciferol (VITAMIN D3) 125 MCG (5000 UT) CAPS, Take 1 capsule (5,000 Units total) by mouth daily with breakfast. Take along with calcium and magnesium., Disp: 30 capsule, Rfl: 5 .  ergocalciferol (VITAMIN D2) 1.25 MG (50000 UT) capsule, Take 1 capsule (  50,000 Units total) by mouth 2 (two) times a week. X 6 weeks., Disp: 12 capsule, Rfl: 0 .  gabapentin (NEURONTIN) 300 MG capsule, Take 3 capsules (900 mg total) by mouth 4 (four) times daily., Disp: 360 capsule, Rfl: 0 .  glucose blood (ONE TOUCH ULTRA TEST) test strip, Use up to 4 times/day, Disp: 100 each, Rfl: 12 .  Insulin Glargine (BASAGLAR KWIKPEN) 100 UNIT/ML SOPN, INJECT 0.24 MLS (24 UNITS TOTAL) INTO THE SKIN AT BEDTIME. (Patient taking differently: Inject 34 Units into the skin at bedtime. ), Disp: 15 pen, Rfl: 2 .  Insulin Pen Needle 32G X 4 MM MISC, 1 Units by Does not apply route every morning. Pen needles, Disp: 90 each, Rfl: 3 .  lidocaine-prilocaine (EMLA) cream, Apply to affected area once (Patient taking differently: Apply 1 application topically daily as needed (port access). Ap), Disp: 30 g, Rfl: 3 .  Magnesium 500 MG CAPS, Take 1 capsule (500 mg total) by mouth 2 (two) times daily at 8 am and 10 pm., Disp: 60 capsule, Rfl: 5 .  meloxicam (MOBIC) 15 MG tablet, Take 1 tablet (15 mg total) by mouth daily., Disp: 30 tablet, Rfl: 0 .  ondansetron (ZOFRAN) 8 MG tablet, Take 1 tablet (8 mg total) by mouth 2 (two) times daily as needed. Start on the third day after chemotherapy., Disp: 30 tablet, Rfl: 1 .  oxyCODONE (OXY IR/ROXICODONE) 5 MG immediate release tablet, Take 1-2 tablets (5-10 mg total) by mouth every 6 (six) hours as needed for  severe pain. Must last 30 days., Disp: 60 tablet, Rfl: 0 .  prochlorperazine (COMPAZINE) 10 MG tablet, Take 1 tablet (10 mg total) by mouth every 6 (six) hours as needed (Nausea or vomiting)., Disp: 30 tablet, Rfl: 1 .  traMADol (ULTRAM) 50 MG tablet, Take 1 tablet (50 mg total) by mouth every 6 (six) hours as needed for severe pain., Disp: 30 tablet, Rfl: 0 No current facility-administered medications for this visit.   Facility-Administered Medications Ordered in Other Visits:  .  heparin lock flush 100 unit/mL, 500 Units, Intravenous, Once, Corcoran, Melissa C, MD .  sodium chloride flush (NS) 0.9 % injection 10 mL, 10 mL, Intravenous, Once, Corcoran, Melissa C, MD  ROS  Constitutional: Denies any fever or chills Gastrointestinal: No reported hemesis, hematochezia, vomiting, or acute GI distress Musculoskeletal: Denies any acute onset joint swelling, redness, loss of ROM, or weakness Neurological: No reported episodes of acute onset apraxia, aphasia, dysarthria, agnosia, amnesia, paralysis, loss of coordination, or loss of consciousness  Allergies  Carolyn Roy is allergic to aspirin.  PFSH  Drug: Carolyn Roy  reports no history of drug use. Alcohol:  reports no history of alcohol use. Tobacco:  reports that she has been smoking cigarettes. She has a 5.50 pack-year smoking history. She has quit using smokeless tobacco.  Her smokeless tobacco use included snuff. Medical:  has a past medical history of Cancer ([place]) (06/15/2017), Depression, Diabetes mellitus without complication ([place]) (7048), Endometriosis, Family history of breast cancer, Headache, Hyperlipidemia, Ovarian mass, and Pneumonia. Surgical: Carolyn Roy  has a past surgical history that includes Oophorectomy; Tubal ligation; Portacath placement (Right, 07/14/2017); Axillary lymph node biopsy (Left, 07/14/2017); Breast lumpectomy (Left, 01/12/2018); Breast biopsy (Left); Partial mastectomy with needle localization (Left, 01/12/2018); and  Sentinel node biopsy (Left, 01/12/2018). Family: family history includes Breast cancer in an other family member; Breast cancer (age of onset: 69) in her maternal grandmother; Breast cancer (age of onset: 64) in an other family member; Breast  cancer (age of onset: 18) in her maternal aunt; Diabetes in her brother; Other in her father; Pancreatitis in her brother; Prostate cancer (age of onset: 45) in her brother.  Constitutional Exam  General appearance: Well nourished, well developed, and well hydrated. In no apparent acute distress Vitals:   03/14/18 0832  BP: 106/73  Pulse: 92  Temp: 97.7 F (36.5 C)  SpO2: 99%  Weight: 177 lb (80.3 kg)  Height: _0  (1.626 m)   BMI Assessment: Estimated body mass index is 30.38 kg/m as calculated from the following:   Height as of this encounter: _1  (1.626 m).   Weight as of this encounter: 177 lb (80.3 kg).  BMI interpretation table: BMI level Category Range association with higher incidence of chronic pain  <18 kg/m2 Underweight   18.5-24.9 kg/m2 Ideal body weight   25-29.9 kg/m2 Overweight Increased incidence by 20%  30-34.9 kg/m2 Obese (Class I) Increased incidence by 68%  35-39.9 kg/m2 Severe obesity (Class II) Increased incidence by 136%  >40 kg/m2 Extreme obesity (Class III) Increased incidence by 254%   Patient's current BMI Ideal Body weight  Body mass index is 30.38 kg/m. Ideal body weight: 54.7 kg (120 lb 9.5 oz) Adjusted ideal body weight: 64.9 kg (143 lb 2.5 oz)   BMI Readings from Last 4 Encounters:  03/14/18 30.38 kg/m  03/13/18 30.48 kg/m  03/08/18 30.55 kg/m  03/05/18 30.66 kg/m   Wt Readings from Last 4 Encounters:  03/14/18 177 lb (80.3 kg)  03/13/18 177 lb 9.6 oz (80.6 kg)  03/08/18 178 lb (80.7 kg)  03/05/18 178 lb 9.6 oz (81 kg)  Psych/Mental status: Alert, oriented x 3 (person, place, & time)       Eyes: PERLA Respiratory: No evidence of acute respiratory distress  Cervical Spine Area Exam  Skin &  Axial Inspection: No masses, redness, edema, swelling, or associated skin lesions Alignment: Symmetrical Functional ROM: Unrestricted ROM      Stability: No instability detected Muscle Tone/Strength: Functionally intact. No obvious neuro-muscular anomalies detected. Sensory (Neurological): Unimpaired Palpation: No palpable anomalies              Upper Extremity (UE) Exam    Side: Right upper extremity  Side: Left upper extremity  Skin & Extremity Inspection: Skin color, temperature, and hair growth are WNL. No peripheral edema or cyanosis. No masses, redness, swelling, asymmetry, or associated skin lesions. No contractures.  Skin & Extremity Inspection: Skin color, temperature, and hair growth are WNL. No peripheral edema or cyanosis. No masses, redness, swelling, asymmetry, or associated skin lesions. No contractures.  Functional ROM: Unrestricted ROM          Functional ROM: Unrestricted ROM          Muscle Tone/Strength: Functionally intact. No obvious neuro-muscular anomalies detected.  Muscle Tone/Strength: Functionally intact. No obvious neuro-muscular anomalies detected.  Sensory (Neurological): Unimpaired          Sensory (Neurological): Unimpaired          Palpation: No palpable anomalies              Palpation: No palpable anomalies              Provocative Test(s):  Phalen's test: deferred Tinel's test: deferred Apley's scratch test (touch opposite shoulder):  Action 1 (Across chest): deferred Action 2 (Overhead): deferred Action 3 (LB reach): deferred   Provocative Test(s):  Phalen's test: deferred Tinel's test: deferred Apley's scratch test (touch opposite shoulder):  Action 1 (Across chest):  deferred Action 2 (Overhead): deferred Action 3 (LB reach): deferred    Thoracic Spine Area Exam  Skin & Axial Inspection: No masses, redness, or swelling Alignment: Symmetrical Functional ROM: Unrestricted ROM Stability: No instability detected Muscle Tone/Strength: Functionally  intact. No obvious neuro-muscular anomalies detected. Sensory (Neurological): Unimpaired Muscle strength & Tone: No palpable anomalies  Lumbar Spine Area Exam  Skin & Axial Inspection: Thoraco-lumbar Scoliosis Alignment: Asymmetric Functional ROM: Decreased ROM affecting both sides Stability: No instability detected Muscle Tone/Strength: Increased muscle tone over affected area Sensory (Neurological): Movement-associated pain Palpation: Complains of area being tender to palpation       Provocative Tests: Hyperextension/rotation test: (+) bilaterally for facet joint pain. Lumbar quadrant test (Kemp's test): (+) bilaterally for facet joint pain. Lateral bending test: deferred today       Patrick's Maneuver: (+) for left-sided S-I arthralgia and for left hip arthralgia FABER* test: (+) for left-sided S-I arthralgia and for left hip arthralgia S-I anterior distraction/compression test: deferred today         S-I lateral compression test: deferred today         S-I Thigh-thrust test: deferred today         S-I Gaenslen's test: deferred today         *(Flexion, ABduction and External Rotation)  Gait & Posture Assessment  Ambulation: Unassisted Gait: Relatively normal for age and body habitus Posture: Antalgic   Lower Extremity Exam    Side: Right lower extremity  Side: Left lower extremity  Stability: No instability observed          Stability: No instability observed          Skin & Extremity Inspection: Skin color, temperature, and hair growth are WNL. No peripheral edema or cyanosis. No masses, redness, swelling, asymmetry, or associated skin lesions. No contractures.  Skin & Extremity Inspection: Skin color, temperature, and hair growth are WNL. No peripheral edema or cyanosis. No masses, redness, swelling, asymmetry, or associated skin lesions. No contractures.  Functional ROM: Unrestricted ROM for hip and knee joints          Functional ROM: Unrestricted ROM for hip and knee  joints          Muscle Tone/Strength: Able to Toe-walk & Heel-walk without problems  Muscle Tone/Strength: Able to Toe-walk & Heel-walk without problems  Sensory (Neurological): Unimpaired        Sensory (Neurological): Articular pain pattern        DTR: Patellar: deferred today Achilles: deferred today Plantar: deferred today  DTR: Patellar: deferred today Achilles: deferred today Plantar: deferred today  Palpation: No palpable anomalies  Palpation: No palpable anomalies   Assessment  Primary Diagnosis & Pertinent Problem List: The primary encounter diagnosis was Chronic low back pain (Bilateral (L>R) w/o sciatica. Diagnoses of Chronic hip pain (Left), Chronic pain of knee (Right), Osteoarthritis of knee (Right), Lumbar facet syndrome (Bilateral) (L>R), Chronic sacroiliac joint pain (Left), Other idiopathic scoliosis, thoracolumbar region, and History of breast cancer were also pertinent to this visit.  Status Diagnosis  Worsening Worsening Improved 1. Chronic low back pain (Bilateral (L>R) w/o sciatica   2. Chronic hip pain (Left)   3. Chronic pain of knee (Right)   4. Osteoarthritis of knee (Right)   5. Lumbar facet syndrome (Bilateral) (L>R)   6. Chronic sacroiliac joint pain (Left)   7. Other idiopathic scoliosis, thoracolumbar region   8. History of breast cancer     Problems updated and reviewed during this visit: Problem  Chronic  low back pain (Bilateral (L>R) w/o sciatica  Chronic hip pain (Left)  Lumbar facet syndrome (Bilateral) (L>R)  Idiopathic Scoliosis  Chronic sacroiliac joint pain (Left)  Chronic pain of knee (Right)  History of Breast Cancer   Plan of Care  Pharmacotherapy (Medications Ordered): No orders of the defined types were placed in this encounter.  Medications administered today: Flint Melter. Roy had no medications administered during this visit.   Procedure Orders     LUMBAR FACET(MEDIAL BRANCH NERVE BLOCK) [facility] Lab Orders  No laboratory  test(s) ordered today    Imaging Orders     MR KNEE RIGHT WO CONTRAST     DG Lumbar Spine Complete W/Bend     DG HIP UNILAT W OR W/O PELVIS 2-3 VIEWS LEFT     DG Si Joints Referral Orders  No referral(s) requested today   Interventional management options: Planned, scheduled, and/or pending:   Diagnostic bilateral lumbar facet block #1 under fluoroscopic guidance and IV sedation Today I will be ordering some x-rays of the lumbar spine, the hip, and the SI joint to further evaluate the physical exam findings.   Considering:   Diagnostic sympathetic nerve block Diagnostic intra-articular right knee injection Diagnostic right knee Hyalgan series   Palliative PRN treatment(s):   None at this time   Provider-requested follow-up: Return for Procedure (w/ sedation): (B) L-FCT BLK #2.  Future Appointments  Date Time Provider [place]  03/19/2018 10:00 AM [facility]-RO CT SIM [facility] None  03/27/2018 10:30 AM [name], [name], MD [facility] None  03/28/2018  9:15 AM Milinda Pointer, MD [facility] None  05/30/2018  1:30 PM [facility]MO GYN ONC [facility] None   Primary Care Physician: Patient, No Pcp Per Location: [facility] Note by: Gaspar Cola, MD Date: 03/14/2018; Time: 9:30 AM

## 2018-03-13 NOTE — Progress Notes (Signed)
Patient ID: Carolyn Roy, female   DOB: August 25, 1965, 53 y.o.   MRN: [id number]  Chief Complaint  Patient presents with  . Routine Post Op    Left breast lumpectomy    HPI Carolyn Roy is a 53 y.o. female.  Here today for post operative care for left breast lumpectomy. Complains of shooting pain intermittently. Denies fever or chills. Wound takes less packing daily.  HPI  Past Medical History:  Diagnosis Date  . 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 Neuoadjuvant chemotherapy.   . Depression   . Diabetes mellitus without complication ([place]) 9562  . Endometriosis   . Family history of breast cancer   . Headache    migraines  . Hyperlipidemia   . Ovarian mass   . Pneumonia    2018    Past Surgical History:  Procedure Laterality Date  . AXILLARY LYMPH NODE BIOPSY Left 07/14/2017   Procedure: INSERTION GEL MARK CLIP LEFT AXILLA;  Surgeon: Robert Bellow, MD;  Location: [facility];  Service: General;  Laterality: Left;  . BREAST BIOPSY Left    Dr Orlene Och BREAST METASTATIC CARCINOMA  . BREAST LUMPECTOMY Left 01/12/2018  . OOPHORECTOMY    . PARTIAL MASTECTOMY WITH NEEDLE LOCALIZATION Left 01/12/2018   Procedure: PARTIAL MASTECTOMY WITH NEEDLE LOCALIZATION;  Surgeon: Robert Bellow, MD;  Location: [facility];  Service: General;  Laterality: Left;  . PORTACATH PLACEMENT Right 07/14/2017   Procedure: INSERTION PORT-A-CATH;  Surgeon: Robert Bellow, MD;  Location: [facility];  Service: General;  Laterality: Right;  . SENTINEL NODE BIOPSY Left 01/12/2018   Procedure: SENTINEL NODE BIOPSY;  Surgeon: Robert Bellow, MD;  Location: [facility];  Service: General;  Laterality: Left;  . TUBAL LIGATION      Family History  Problem Relation Age of Onset  . Other Father        No info about father or paternal relatives  . Diabetes Brother   . Pancreatitis Brother   . Prostate cancer Brother 87        currently 51 / maternal half-brother  . Breast cancer Maternal Grandmother 40       deceased 101s  . Breast cancer Maternal Aunt 65       currently 48  . Breast cancer Other 23       mother's sister; deceased 63  . Breast cancer Other        mother's sister; age at dx unknown    Social History Social History   Tobacco Use  . Smoking status: Current Every Day Smoker    Packs/day: 0.50    Years: 11.00    Pack years: 5.50    Types: Cigarettes  . Smokeless tobacco: Former Systems developer    Types: Snuff  Substance Use Topics  . Alcohol use: No    Alcohol/week: 0.0 standard drinks  . Drug use: No    Allergies  Allergen Reactions  . Aspirin Nausea And Vomiting    Current Outpatient Medications  Medication Sig Dispense Refill  . albuterol (PROVENTIL HFA;VENTOLIN HFA) 108 (90 Base) MCG/ACT inhaler Inhale 2 puffs into the lungs every 6 (six) hours as needed for wheezing or shortness of breath. 1 Inhaler 0  . aspirin-acetaminophen-caffeine (EXCEDRIN MIGRAINE) 250-250-65 MG tablet Take 2 tablets by mouth daily as needed for headache.    . Blood Glucose Monitoring Suppl (FIFTY50 GLUCOSE METER 2.0) w/Device KIT Use as directed    .  calcium carbonate (CALCIUM 600) 600 MG TABS tablet Take 1 tablet (600 mg total) by mouth 2 (two) times daily with a meal. 60 tablet 0  . canagliflozin (INVOKANA) 300 MG TABS tablet Take 300 mg by mouth daily.    . Cholecalciferol (VITAMIN D3) 125 MCG (5000 UT) CAPS Take 1 capsule (5,000 Units total) by mouth daily with breakfast. Take along with calcium and magnesium. 30 capsule 5  . ergocalciferol (VITAMIN D2) 1.25 MG (50000 UT) capsule Take 1 capsule (50,000 Units total) by mouth 2 (two) times a week. X 6 weeks. 12 capsule 0  . gabapentin (NEURONTIN) 300 MG capsule Take 3 capsules (900 mg total) by mouth 4 (four) times daily. 360 capsule 0  . glucose blood (ONE TOUCH ULTRA TEST) test strip Use up to 4 times/day 100 each 12  . Insulin Glargine (BASAGLAR KWIKPEN) 100  UNIT/ML SOPN INJECT 0.24 MLS (24 UNITS TOTAL) INTO THE SKIN AT BEDTIME. (Patient taking differently: Inject 34 Units into the skin at bedtime. ) 15 pen 2  . Insulin Pen Needle 32G X 4 MM MISC 1 Units by Does not apply route every morning. Pen needles 90 each 3  . lidocaine-prilocaine (EMLA) cream Apply to affected area once (Patient taking differently: Apply 1 application topically daily as needed (port access). Ap) 30 g 3  . Magnesium 500 MG CAPS Take 1 capsule (500 mg total) by mouth 2 (two) times daily at 8 am and 10 pm. 60 capsule 5  . meloxicam (MOBIC) 15 MG tablet Take 1 tablet (15 mg total) by mouth daily. 30 tablet 0  . ondansetron (ZOFRAN) 8 MG tablet Take 1 tablet (8 mg total) by mouth 2 (two) times daily as needed. Start on the third day after chemotherapy. 30 tablet 1  . oxyCODONE (OXY IR/ROXICODONE) 5 MG immediate release tablet Take 1-2 tablets (5-10 mg total) by mouth every 6 (six) hours as needed for severe pain. Must last 30 days. 60 tablet 0  . prochlorperazine (COMPAZINE) 10 MG tablet Take 1 tablet (10 mg total) by mouth every 6 (six) hours as needed (Nausea or vomiting). 30 tablet 1  . traMADol (ULTRAM) 50 MG tablet Take 1 tablet (50 mg total) by mouth every 6 (six) hours as needed for severe pain. 30 tablet 0   No current facility-administered medications for this visit.    Facility-Administered Medications Ordered in Other Visits  Medication Dose Route Frequency Provider Last Rate Last Dose  . heparin lock flush 100 unit/mL  500 Units Intravenous Once Corcoran, Melissa C, MD      . sodium chloride flush (NS) 0.9 % injection 10 mL  10 mL Intravenous Once Lequita Asal, MD        Review of Systems Review of Systems  Blood pressure 104/69, pulse 92, temperature (!) 97.3 F (36.3 C), temperature source Temporal, resp. rate 16, height '5\' 4"'  (1.626 m), weight 177 lb 9.6 oz (80.6 kg), SpO2 95 %.  Physical Exam Physical Exam Exam conducted with a chaperone present.   Constitutional:      Appearance: Normal appearance.  Chest:    Skin:    General: Skin is warm and dry.  Neurological:     Mental Status: She is alert and oriented to person, place, and time.  Psychiatric:        Mood and Affect: Mood normal.     Data Reviewed  February 02, 2018 wound culture: Gram Stain FEW WBC PRESENT, PREDOMINANTLY PMN  RARE GRAM POSITIVE COCCI  Culture RARE NORMAL SKIN FLORA  Performed at [facility], [place] [address] [place], [place] [address]      Assessment    Doing well post wide excision and axillary sentinel node biopsy x5.    Plan    There was a question of the lack of tumor regression in the 5- nodes although the clip missed material had been identified.  I do not believe there is an indication for formal axillary dissection.  The patient had a complete pathologic response in the breast and 5 nodes, 3 sentinel nodes, were removed at the time of axillary exam.  This can be followed clinically and with ultrasound if needed.    The patient will make use of local heat for comfort.  No further packing is required.  Follow-up in 2 weeks.  HPI and physical exam has been scribed under the direction and in the presence of Robert Bellow, MD. Karie Fetch, RN   .I have completed the exam and reviewed the above documentation for accuracy and completeness.  I agree with the above.  Haematologist has been used and any errors in dictation or transcription are unintentional.  Hervey Ard, M.D., F.A.C.S.  [name] Laveda Demedeiros 03/14/2018, 3:35 PM

## 2018-03-13 NOTE — Patient Instructions (Addendum)
The patient is aware to call back for any questions or new concerns. No further packing necessary to left breast incision May shower Follow up in 2 weeks Keep appointment with [place]

## 2018-03-14 ENCOUNTER — Ambulatory Visit: Payer: No Typology Code available for payment source

## 2018-03-14 ENCOUNTER — Encounter: Payer: Self-pay | Admitting: Pain Medicine

## 2018-03-14 ENCOUNTER — Ambulatory Visit
Admission: RE | Admit: 2018-03-14 | Discharge: 2018-03-14 | Disposition: A | Discharge status: Home / Self Care | Payer: 59 | Source: Ambulatory Visit | Attending: Pain Medicine | Admitting: Pain Medicine

## 2018-03-14 ENCOUNTER — Other Ambulatory Visit: Payer: Self-pay

## 2018-03-14 ENCOUNTER — Ambulatory Visit (HOSPITAL_BASED_OUTPATIENT_CLINIC_OR_DEPARTMENT_OTHER): Payer: 59 | Admitting: Pain Medicine

## 2018-03-14 VITALS — BP 106/73 | HR 92 | Temp 97.7°F | Ht 64.0 in | Wt 177.0 lb

## 2018-03-14 DIAGNOSIS — M1711 Unilateral primary osteoarthritis, right knee: Secondary | ICD-10-CM

## 2018-03-14 DIAGNOSIS — M47816 Spondylosis without myelopathy or radiculopathy, lumbar region: Secondary | ICD-10-CM

## 2018-03-14 DIAGNOSIS — G8929 Other chronic pain: Secondary | ICD-10-CM | POA: Insufficient documentation

## 2018-03-14 DIAGNOSIS — M545 Low back pain, unspecified: Secondary | ICD-10-CM

## 2018-03-14 DIAGNOSIS — M533 Sacrococcygeal disorders, not elsewhere classified: Secondary | ICD-10-CM

## 2018-03-14 DIAGNOSIS — M25561 Pain in right knee: Secondary | ICD-10-CM

## 2018-03-14 DIAGNOSIS — Z853 Personal history of malignant neoplasm of breast: Secondary | ICD-10-CM | POA: Insufficient documentation

## 2018-03-14 DIAGNOSIS — M25552 Pain in left hip: Secondary | ICD-10-CM | POA: Diagnosis not present

## 2018-03-14 DIAGNOSIS — M4120 Other idiopathic scoliosis, site unspecified: Secondary | ICD-10-CM | POA: Insufficient documentation

## 2018-03-14 DIAGNOSIS — M4125 Other idiopathic scoliosis, thoracolumbar region: Secondary | ICD-10-CM | POA: Insufficient documentation

## 2018-03-14 IMAGING — CR DG LUMBAR SPINE COMPLETE W/ BEND
1 series · 7 of 7 positions shown · non-contrast
Comparison: None.

CLINICAL DATA: Lumbago with radicular symptoms. History of breast
carcinoma

EXAM:
LUMBAR SPINE - COMPLETE WITH BENDING VIEWS

[Series 1: dg lumbar spine complete w/bend 6+v · 0.14mm/px · 7 of 7 slices shown]
[im 1/7]
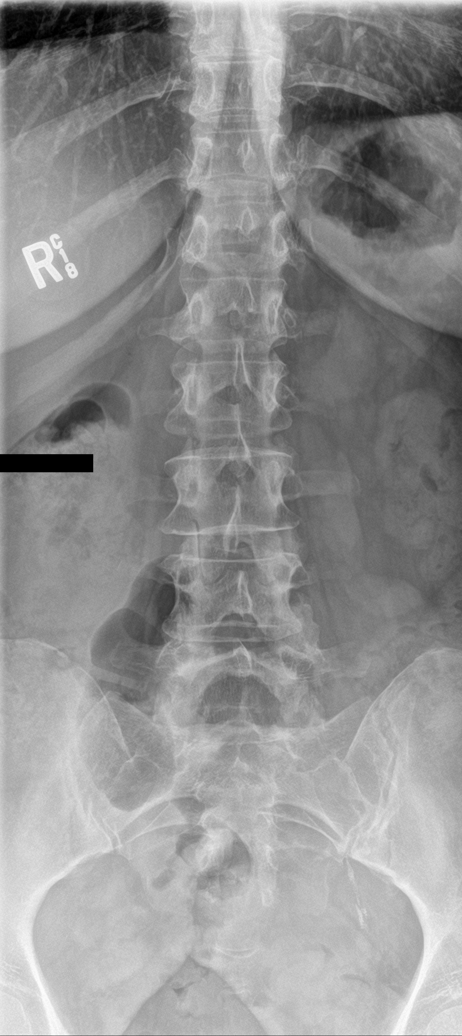
[im 2/7]
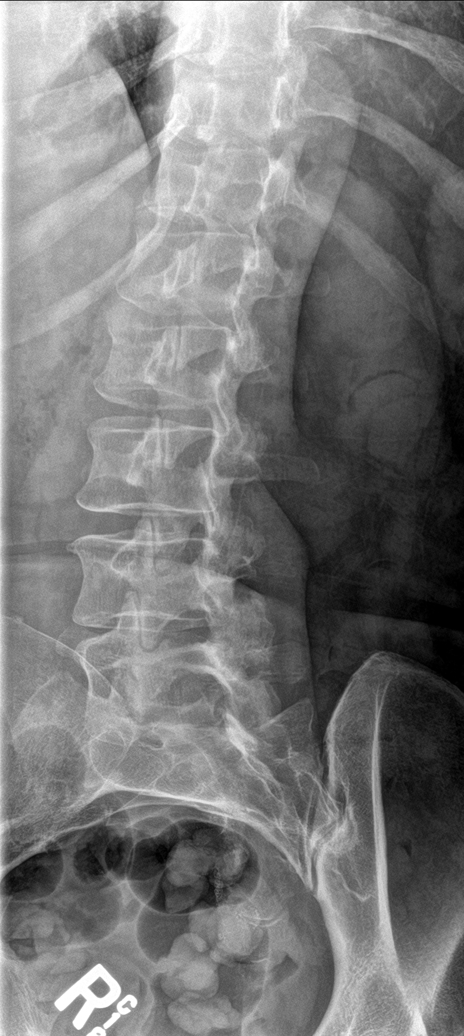
[im 3/7]
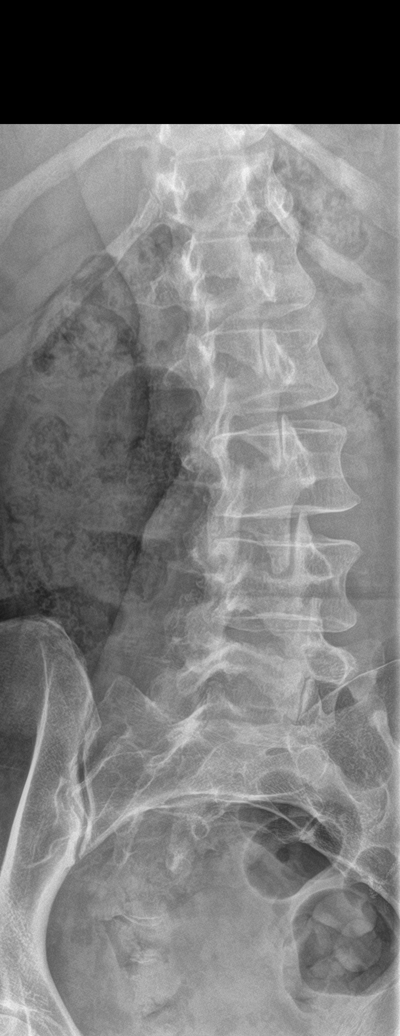
[im 4/7]
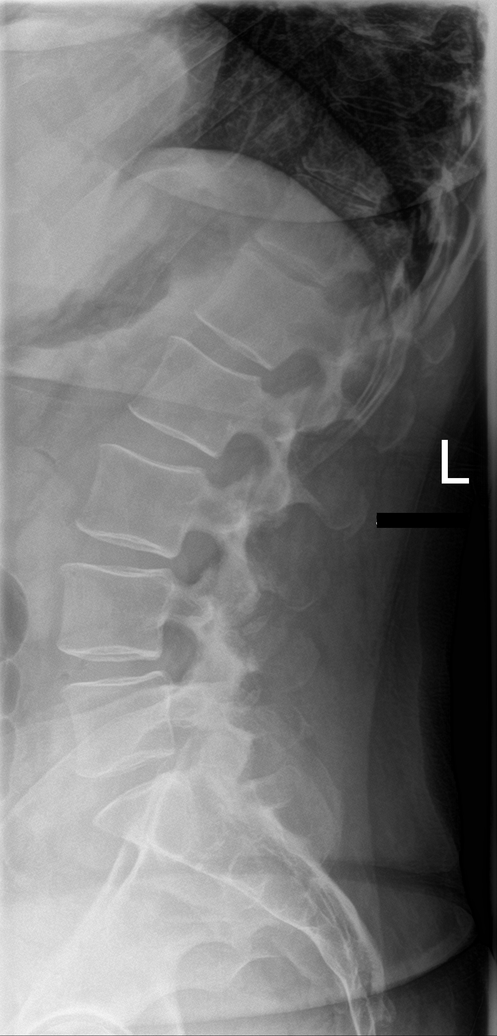
[im 5/7]
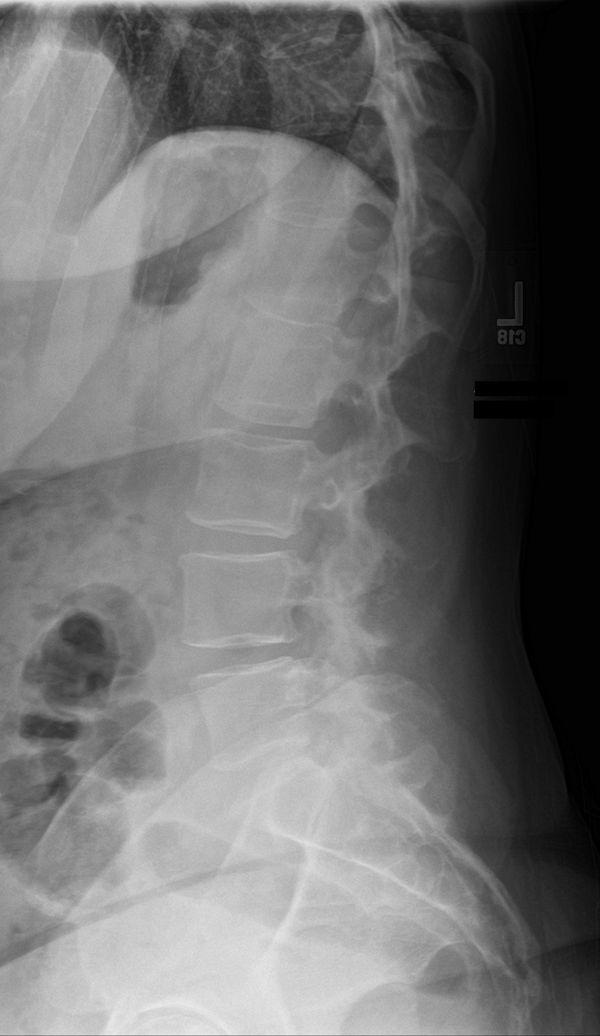
[im 6/7]
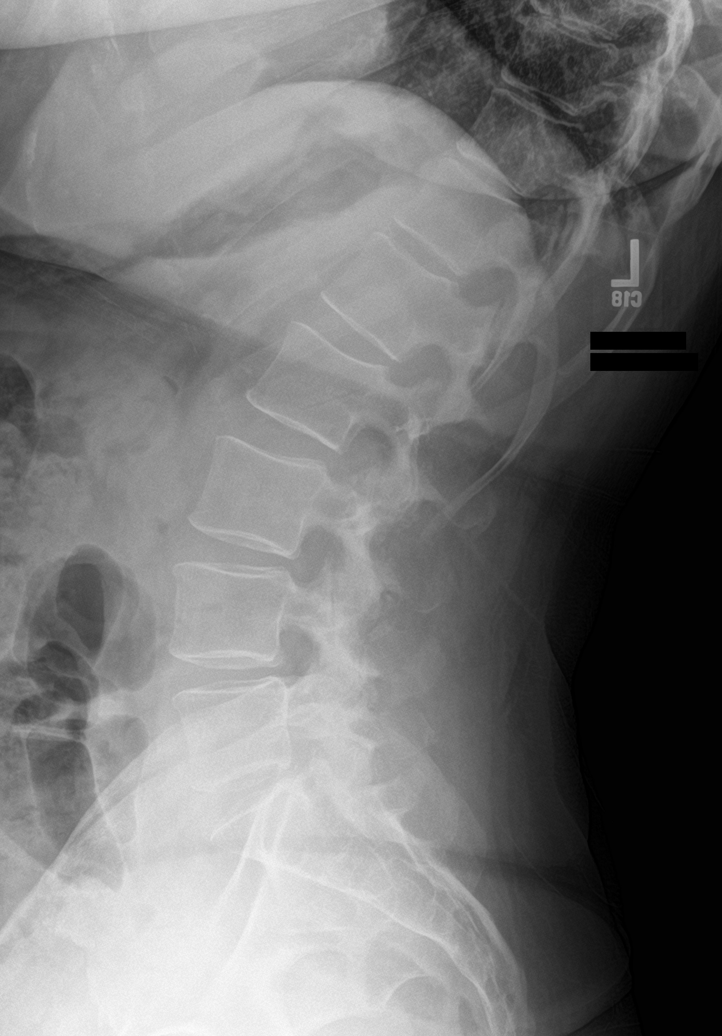
[im 7/7]
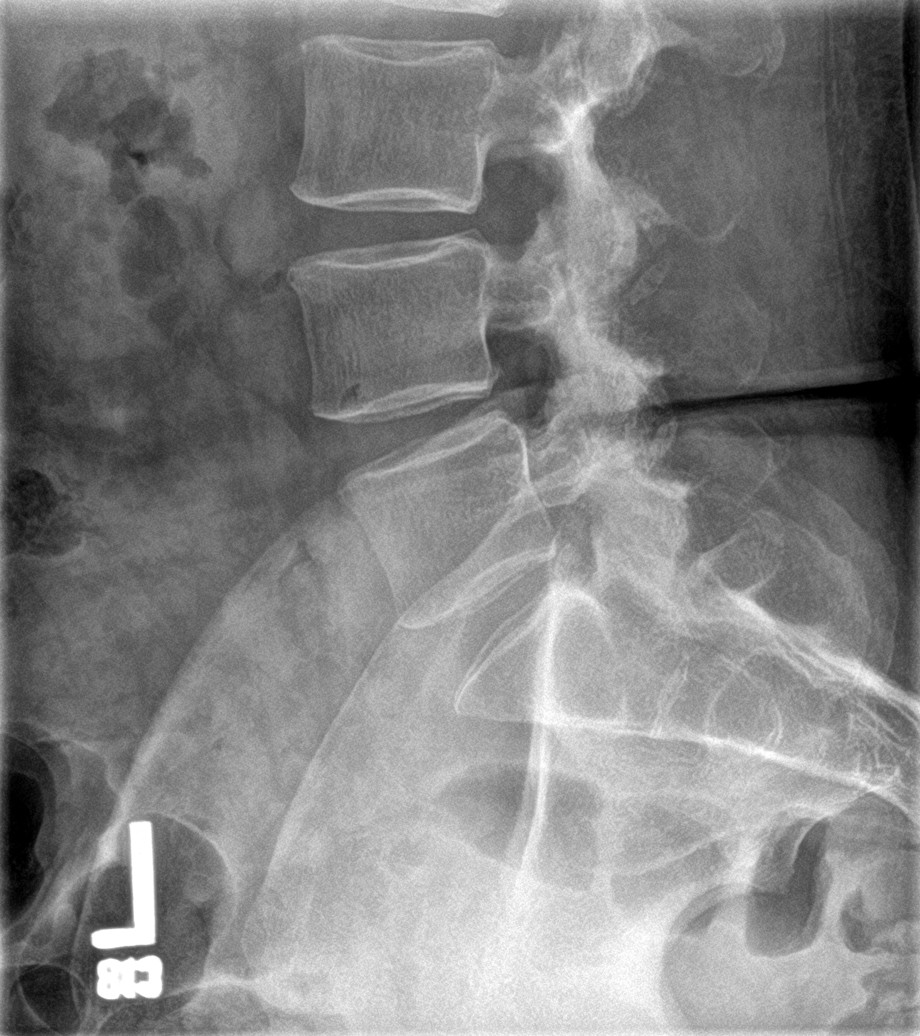

[7 of 7 positions shown; findings below may reference images not displayed]

FINDINGS: Standing frontal, standing neutral lateral, standing flexion
lateral, standing extension lateral, spot lumbosacral lateral, and
bilateral oblique views were obtained. There are 5 non-rib-bearing
lumbar type vertebral bodies. There is no appreciable fracture.
There is no evident spondylolisthesis. There is no appreciable
change in lateral alignment between neutral lateral, flexion
lateral, and extension lateral positioning. Disc spaces appear
unremarkable. There is no appreciable facet arthropathy. No blastic
or lytic bone lesions evident.
IMPRESSION: No fracture or spondylolisthesis. No change in lateral alignment
between neutral lateral, flexion lateral, and extension lateral
positioning. No appreciable arthropathic changes.

## 2018-03-14 IMAGING — CR DG SI JOINTS 3+V
1 series · 3 of 3 positions shown · non-contrast
Comparison: None.

CLINICAL DATA: Pain.  History of breast carcinoma

EXAM:
BILATERAL SACROILIAC JOINTS - 3+ VIEW

[Series 1: dg si joints · 0.14mm/px · 3 of 3 slices shown]
[im 1/3]
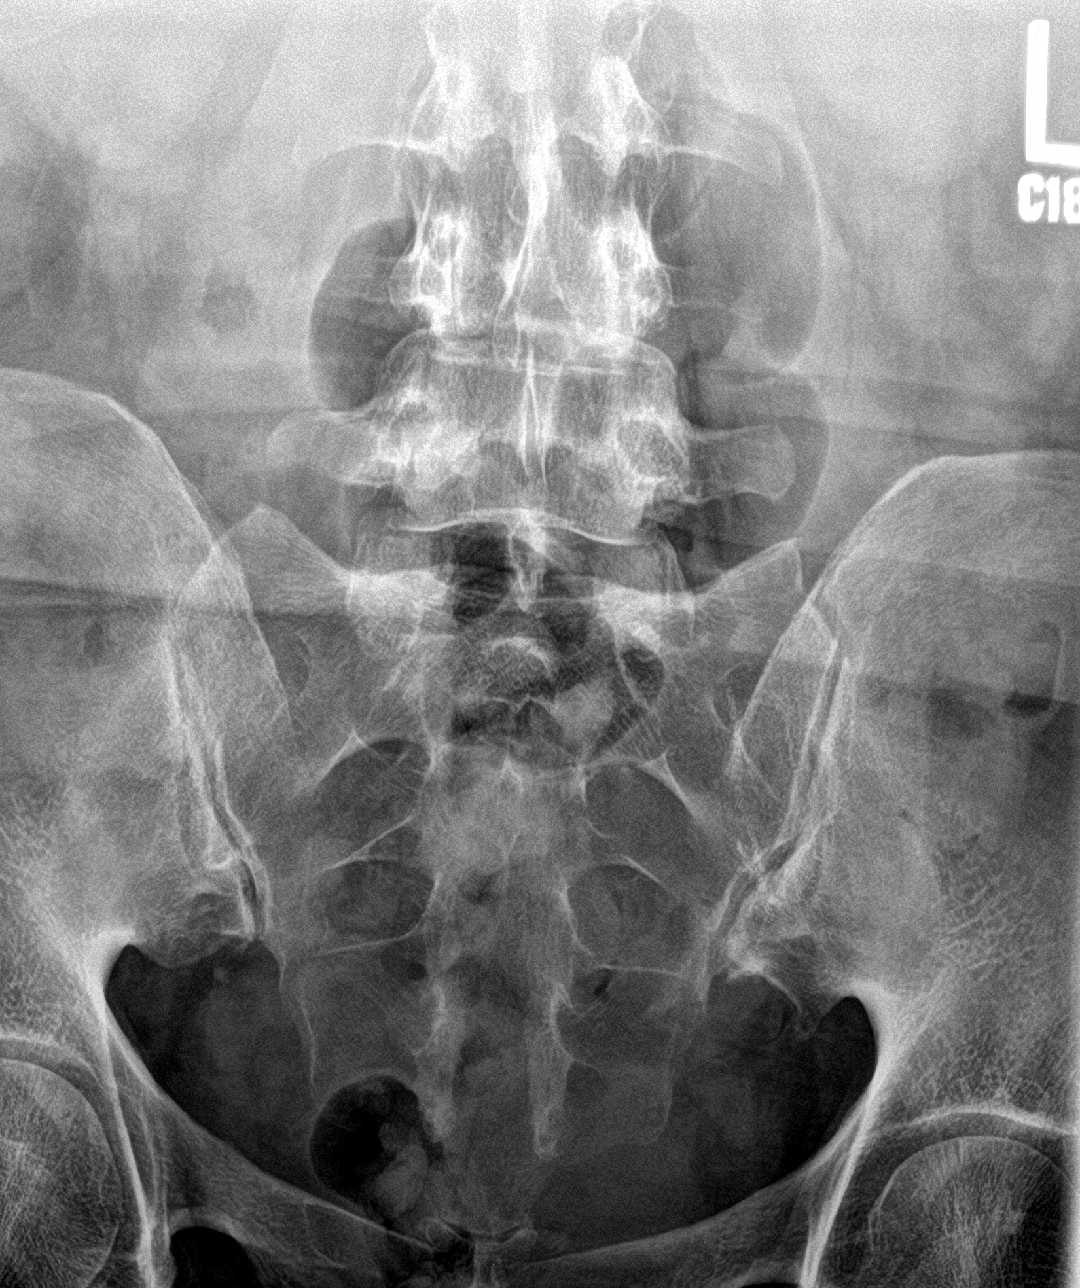
[im 2/3]
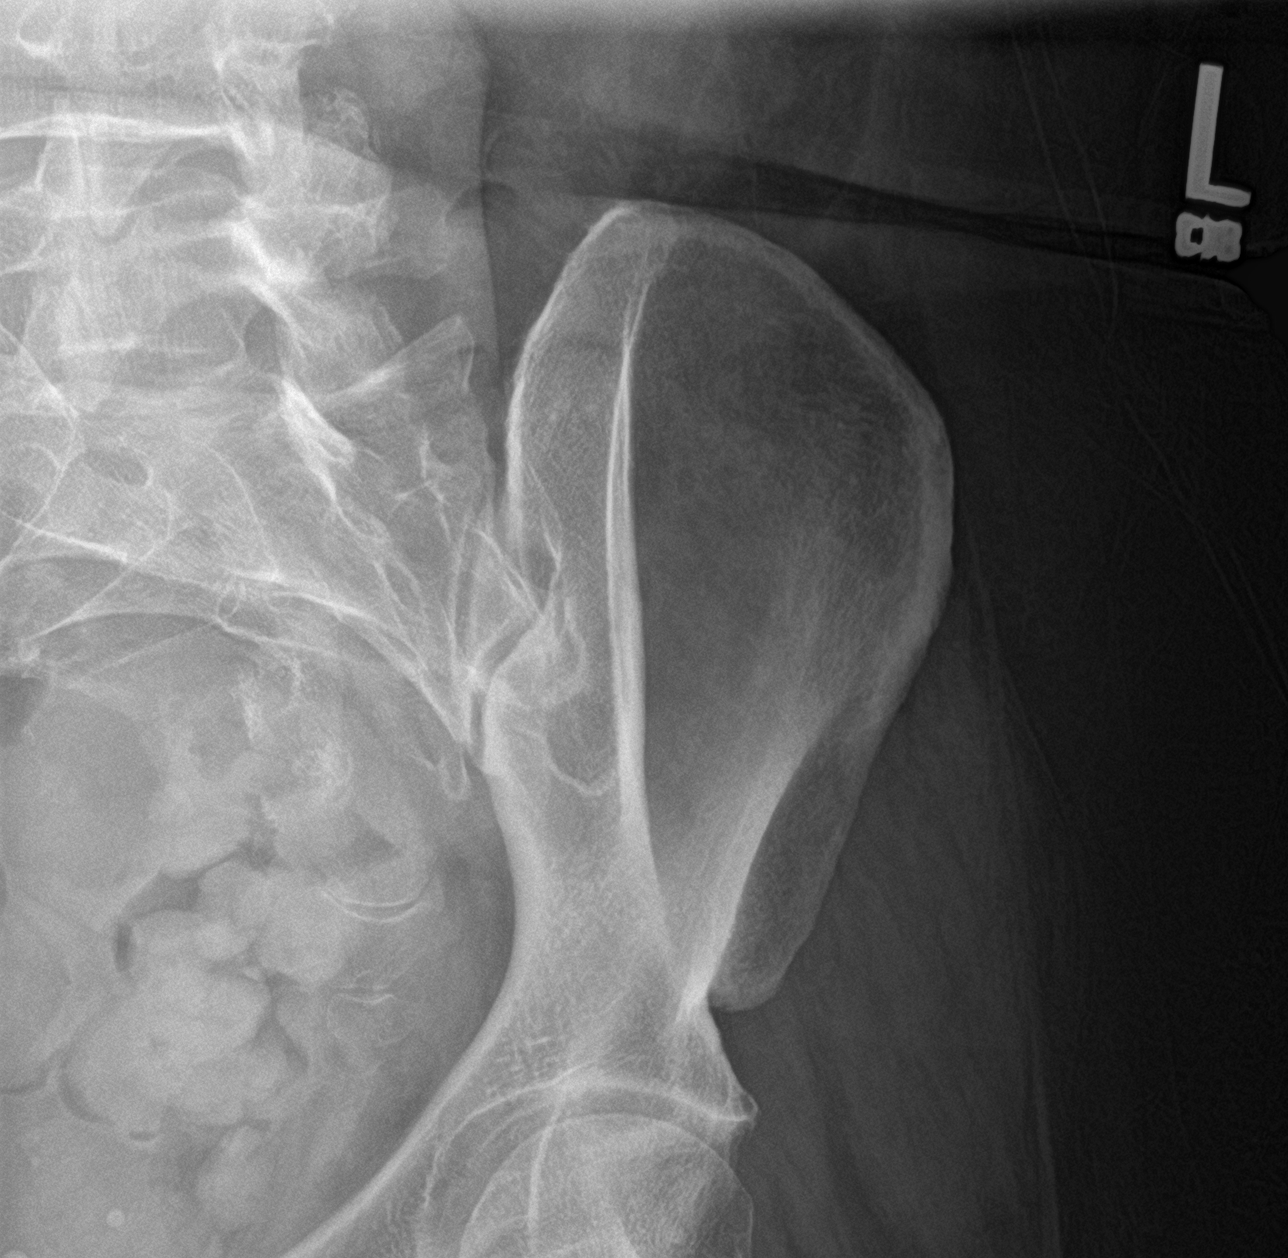
[im 3/3]
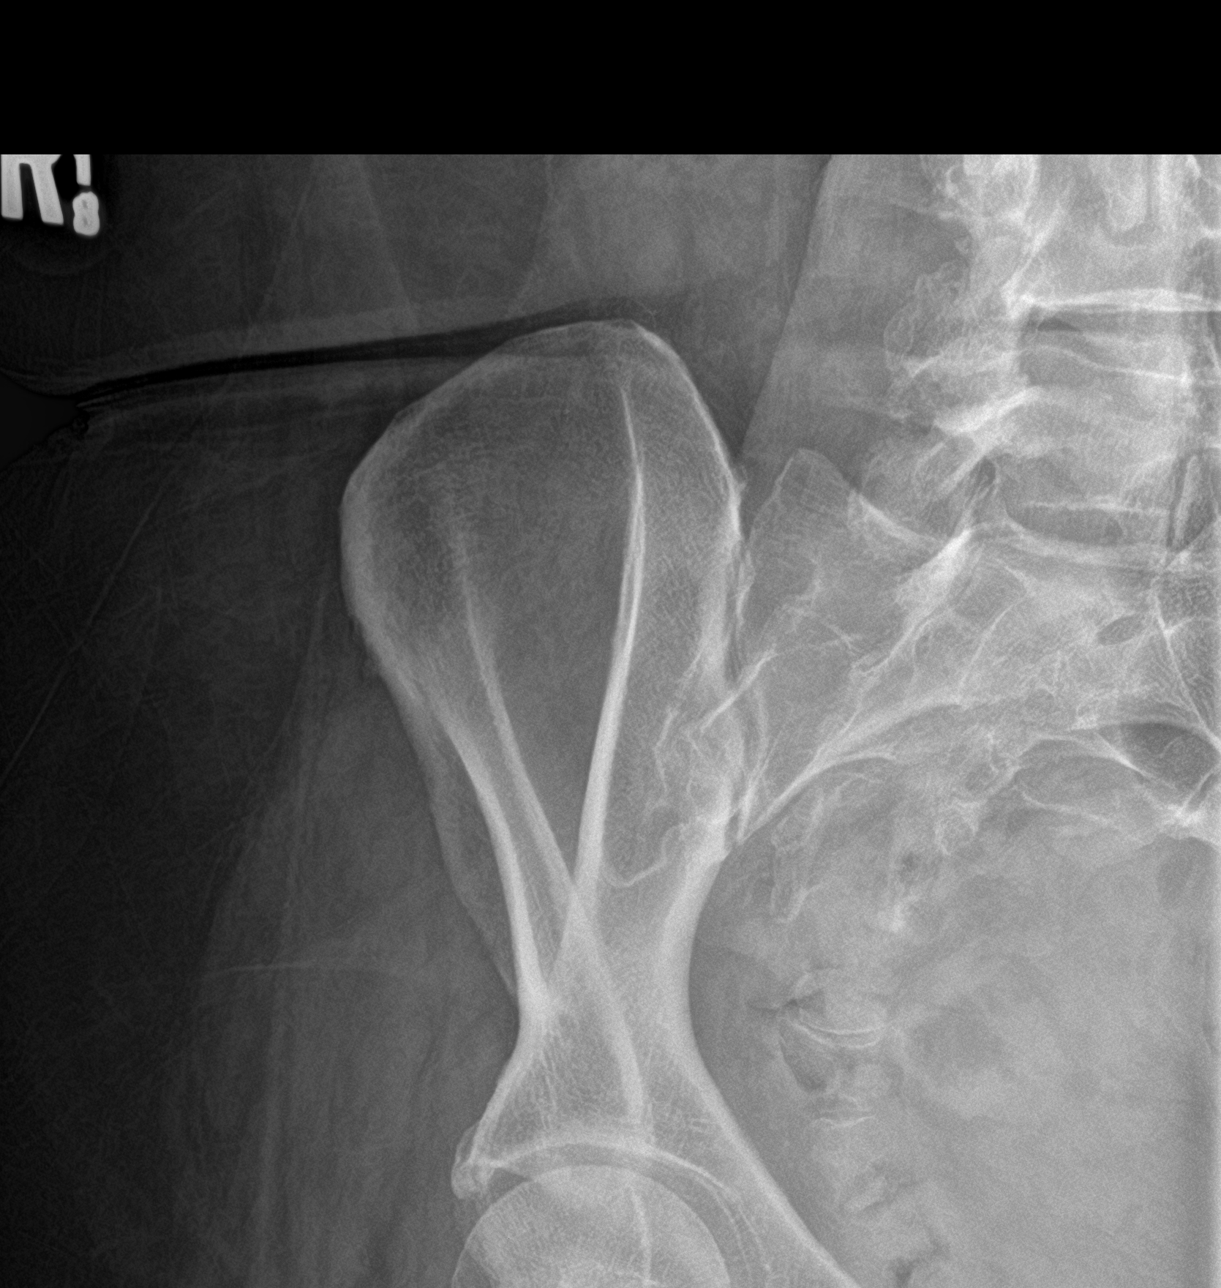

[3 of 3 positions shown; findings below may reference images not displayed]

FINDINGS: Frontal and bilateral oblique views were obtained. No fracture or
diastasis. Joint spaces appear unremarkable. No erosive change or
sacroiliitis.
IMPRESSION: No fracture or diastases.  No evident arthropathy.

## 2018-03-14 NOTE — Progress Notes (Signed)
Nursing Pain Medication Assessment:  Safety precautions to be maintained throughout the outpatient stay will include: orient to surroundings, keep bed in low position, maintain call bell within reach at all times, provide assistance with transfer out of bed and ambulation.  Medication Inspection Compliance: Pill count conducted under aseptic conditions, in front of the patient. Neither the pills nor the bottle was removed from the patient's sight at any time. Once count was completed pills were immediately returned to the patient in their original bottle.  Medication: Oxycodone IR Pill/Patch Count: 8 of 60 pills remain Pill/Patch Appearance: Markings consistent with prescribed medication Bottle Appearance: Standard pharmacy container. Clearly labeled. Filled Date: 1 / 8 / 2020 Last Medication intake:  Today

## 2018-03-14 NOTE — Patient Instructions (Signed)
______________________________________________________________________________________________  Specialty Pain Scale  Introduction:  There are significant differences in how pain is reported. The word pain usually refers to physical pain, but it is also a common synonym of suffering. The medical community uses a scale from 0 (zero) to 10 (ten) to report pain level. Zero (0) is described as "no pain", while ten (10) is described as "the worse pain you can imagine". The problem with this scale is that physical pain is reported along with suffering. Suffering refers to mental pain, or more often yet it refers to any unpleasant feeling, emotion or aversion associated with the perception of harm or threat of harm. It is the psychological component of pain.  Pain Specialists prefer to separate the two components. The pain scale used by this practice is the Verbal Numerical Rating Scale (VNRS-11). This scale is for the physical pain only. DO NOT INCLUDE how your pain psychologically affects you. This scale is for adults 21 years of age and older. It has 11 (eleven) levels. The 1st level is 0/10. This means: "right now, I have no pain". In the context of pain management, it also means: "right now, my physical pain is under control with the current therapy".  General Information:  The scale should reflect your current level of pain. Unless you are specifically asked for the level of your worst pain, or your average pain. If you are asked for one of these two, then it should be understood that it is over the past 24 hours.  Levels 1 (one) through 5 (five) are described below, and can be treated as an outpatient. Ambulatory pain management facilities such as ours are more than adequate to treat these levels. Levels 6 (six) through 10 (ten) are also described below, however, these must be treated as a hospitalized patient. While levels 6 (six) and 7 (seven) may be evaluated at an urgent care facility, levels 8  (eight) through 10 (ten) constitute medical emergencies and as such, they belong in a hospital's emergency department. When having these levels (as described below), do not come to our office. Our facility is not equipped to manage these levels. Go directly to an urgent care facility or an emergency department to be evaluated.  Definitions:  Activities of Daily Living (ADL): Activities of daily living (ADL or ADLs) is a term used in healthcare to refer to people's daily self-care activities. Health professionals often use a person's ability or inability to perform ADLs as a measurement of their functional status, particularly in regard to people post injury, with disabilities and the elderly. There are two ADL levels: Basic and Instrumental. Basic Activities of Daily Living (BADL  or BADLs) consist of self-care tasks that include: Bathing and showering; personal hygiene and grooming (including brushing/combing/styling hair); dressing; Toilet hygiene (getting to the toilet, cleaning oneself, and getting back up); eating and self-feeding (not including cooking or chewing and swallowing); functional mobility, often referred to as "transferring", as measured by the ability to walk, get in and out of bed, and get into and out of a chair; the broader definition (moving from one place to another while performing activities) is useful for people with different physical abilities who are still able to get around independently. Basic ADLs include the things many people do when they get up in the morning and get ready to go out of the house: get out of bed, go to the toilet, bathe, dress, groom, and eat. On the average, loss of function typically follows a particular order.   Hygiene is the first to go, followed by loss of toilet use and locomotion. The last to go is the ability to eat. When there is only one remaining area in which the person is independent, there is a 62.9% chance that it is eating and only a 3.5% chance  that it is hygiene. Instrumental Activities of Daily Living (IADL or IADLs) are not necessary for fundamental functioning, but they let an individual live independently in a community. IADL consist of tasks that include: cleaning and maintaining the house; home establishment and maintenance; care of others (including selecting and supervising caregivers); care of pets; child rearing; managing money; managing financials (investments, etc.); meal preparation and cleanup; shopping for groceries and necessities; moving within the community; safety procedures and emergency responses; health management and maintenance (taking prescribed medications); and using the telephone or other form of communication.  Instructions:  Most patients tend to report their pain as a combination of two factors, their physical pain and their psychosocial pain. This last one is also known as "suffering" and it is reflection of how physical pain affects you socially and psychologically. From now on, report them separately.  From this point on, when asked to report your pain level, report only your physical pain. Use the following table for reference.  Pain Clinic Pain Levels (0-5/10)  Pain Level Score  Description  No Pain 0   Mild pain 1 Nagging, annoying, but does not interfere with basic activities of daily living (ADL). Patients are able to eat, bathe, get dressed, toileting (being able to get on and off the toilet and perform personal hygiene functions), transfer (move in and out of bed or a chair without assistance), and maintain continence (able to control bladder and bowel functions). Blood pressure and heart rate are unaffected. A normal heart rate for a healthy adult ranges from 60 to 100 bpm (beats per minute).   Mild to moderate pain 2 Noticeable and distracting. Impossible to hide from other people. More frequent flare-ups. Still possible to adapt and function close to normal. It can be very annoying and may have  occasional stronger flare-ups. With discipline, patients may get used to it and adapt.   Moderate pain 3 Interferes significantly with activities of daily living (ADL). It becomes difficult to feed, bathe, get dressed, get on and off the toilet or to perform personal hygiene functions. Difficult to get in and out of bed or a chair without assistance. Very distracting. With effort, it can be ignored when deeply involved in activities.   Moderately severe pain 4 Impossible to ignore for more than a few minutes. With effort, patients may still be able to manage work or participate in some social activities. Very difficult to concentrate. Signs of autonomic nervous system discharge are evident: dilated pupils (mydriasis); mild sweating (diaphoresis); sleep interference. Heart rate becomes elevated (>115 bpm). Diastolic blood pressure (lower number) rises above 100 mmHg. Patients find relief in laying down and not moving.   Severe pain 5 Intense and extremely unpleasant. Associated with frowning face and frequent crying. Pain overwhelms the senses.  Ability to do any activity or maintain social relationships becomes significantly limited. Conversation becomes difficult. Pacing back and forth is common, as getting into a comfortable position is nearly impossible. Pain wakes you up from deep sleep. Physical signs will be obvious: pupillary dilation; increased sweating; goosebumps; brisk reflexes; cold, clammy hands and feet; nausea, vomiting or dry heaves; loss of appetite; significant sleep disturbance with inability to fall asleep or to   remain asleep. When persistent, significant weight loss is observed due to the complete loss of appetite and sleep deprivation.  Blood pressure and heart rate becomes significantly elevated. Caution: If elevated blood pressure triggers a pounding headache associated with blurred vision, then the patient should immediately seek attention at an urgent or emergency care unit, as  these may be signs of an impending stroke.    Emergency Department Pain Levels (6-10/10)  Emergency Room Pain 6 Severely limiting. Requires emergency care and should not be seen or managed at an outpatient pain management facility. Communication becomes difficult and requires great effort. Assistance to reach the emergency department may be required. Facial flushing and profuse sweating along with potentially dangerous increases in heart rate and blood pressure will be evident.   Distressing pain 7 Self-care is very difficult. Assistance is required to transport, or use restroom. Assistance to reach the emergency department will be required. Tasks requiring coordination, such as bathing and getting dressed become very difficult.   Disabling pain 8 Self-care is no longer possible. At this level, pain is disabling. The individual is unable to do even the most "basic" activities such as walking, eating, bathing, dressing, transferring to a bed, or toileting. Fine motor skills are lost. It is difficult to think clearly.   Incapacitating pain 9 Pain becomes incapacitating. Thought processing is no longer possible. Difficult to remember your own name. Control of movement and coordination are lost.   The worst pain imaginable 10 At this level, most patients pass out from pain. When this level is reached, collapse of the autonomic nervous system occurs, leading to a sudden drop in blood pressure and heart rate. This in turn results in a temporary and dramatic drop in blood flow to the brain, leading to a loss of consciousness. Fainting is one of the body's self defense mechanisms. Passing out puts the brain in a calmed state and causes it to shut down for a while, in order to begin the healing process.    Summary: 1. Refer to this scale when providing [place] with your pain level. 2. Be accurate and careful when reporting your pain level. This will help with your care. 3. Over-reporting your pain level will  lead to loss of credibility. 4. Even a level of 1/10 means that there is pain and will be treated at our facility. 5. High, inaccurate reporting will be documented as "Symptom Exaggeration", leading to loss of credibility and suspicions of possible secondary gains such as obtaining more narcotics, or wanting to appear disabled, for fraudulent reasons. 6. Only pain levels of 5 or below will be seen at our facility. 7. Pain levels of 6 and above will be sent to the Emergency Department and the appointment cancelled. ______________________________________________________________________________________________   ____________________________________________________________________________________________  Preparing for Procedure with Sedation  Instructions: . Oral Intake: Do not eat or drink anything for at least 8 hours prior to your procedure. . Transportation: Public transportation is not allowed. Bring an adult driver. The driver must be physically present in our waiting room before any procedure can be started. [name] Physical Assistance: Bring an adult physically capable of assisting you, in the event you need help. This adult should keep you company at home for at least 6 hours after the procedure. . Blood Pressure Medicine: Take your blood pressure medicine with a sip of water the morning of the procedure. . Blood thinners: Notify our staff if you are taking any blood thinners. Depending on which one you take, there will be  specific instructions on how and when to stop it. . Diabetics on insulin: Notify the staff so that you can be scheduled 1st case in the morning. If your diabetes requires high dose insulin, take only  of your normal insulin dose the morning of the procedure and notify the staff that you have done so. . Preventing infections: Shower with an antibacterial soap the morning of your procedure. . Build-up your immune system: Take 1000 mg of Vitamin C with every meal (3 times a day)  the day prior to your procedure. . Antibiotics: Inform the staff if you have a condition or reason that requires you to take antibiotics before dental procedures. . Pregnancy: If you are pregnant, call and cancel the procedure. . Sickness: If you have a cold, fever, or any active infections, call and cancel the procedure. . Arrival: You must be in the facility at least 30 minutes prior to your scheduled procedure. . Children: Do not bring children with you. . Dress appropriately: Bring dark clothing that you would not mind if they get stained. . Valuables: Do not bring any jewelry or valuables.  Procedure appointments are reserved for interventional treatments only. . No Prescription Refills. . No medication changes will be discussed during procedure appointments. . No disability issues will be discussed.  Reasons to call and reschedule or cancel your procedure: (Following these recommendations will minimize the risk of a serious complication.) . Surgeries: Avoid having procedures within 2 weeks of any surgery. (Avoid for 2 weeks before or after any surgery). . Flu Shots: Avoid having procedures within 2 weeks of a flu shots or . (Avoid for 2 weeks before or after immunizations). . Barium: Avoid having a procedure within 7-10 days after having had a radiological study involving the use of radiological contrast. (Myelograms, Barium swallow or enema study). . Heart attacks: Avoid any elective procedures or surgeries for the initial 6 months after a "Myocardial Infarction" (Heart Attack). . Blood thinners: It is imperative that you stop these medications before procedures. Let us know if you if you take any blood thinner.  . Infection: Avoid procedures during or within two weeks of an infection (including chest colds or gastrointestinal problems). Symptoms associated with infections include: Localized redness, fever, chills, night sweats or profuse sweating, burning sensation when voiding, cough,  congestion, stuffiness, runny nose, sore throat, diarrhea, nausea, vomiting, cold or Flu symptoms, recent or current infections. It is specially important if the infection is over the area that we intend to treat. . Heart and lung problems: Symptoms that may suggest an active cardiopulmonary problem include: cough, chest pain, breathing difficulties or shortness of breath, dizziness, ankle swelling, uncontrolled high or unusually low blood pressure, and/or palpitations. If you are experiencing any of these symptoms, cancel your procedure and contact your primary care physician for an evaluation.  Remember:  Regular Business hours are:  Monday to Thursday 8:00 AM to 4:00 PM  Provider's Schedule: Aria Pickrell, MD:  Procedure days: Tuesday and Thursday 7:30 AM to 4:00 PM  Bilal Lateef, MD:  Procedure days: Monday and Wednesday 7:30 AM to 4:00 PM ____________________________________________________________________________________________    

## 2018-03-15 ENCOUNTER — Ambulatory Visit: Payer: No Typology Code available for payment source

## 2018-03-16 ENCOUNTER — Ambulatory Visit: Payer: No Typology Code available for payment source

## 2018-03-19 ENCOUNTER — Ambulatory Visit
Admission: RE | Admit: 2018-03-19 | Discharge: 2018-03-19 | Disposition: A | Discharge status: Home / Self Care | Payer: No Typology Code available for payment source | Source: Ambulatory Visit | Attending: Radiation Oncology | Admitting: Radiation Oncology

## 2018-03-19 ENCOUNTER — Ambulatory Visit: Payer: No Typology Code available for payment source

## 2018-03-19 DIAGNOSIS — C50512 Malignant neoplasm of lower-outer quadrant of left female breast: Secondary | ICD-10-CM | POA: Diagnosis not present

## 2018-03-19 DIAGNOSIS — Z170 Estrogen receptor positive status [ER+]: Secondary | ICD-10-CM | POA: Diagnosis not present

## 2018-03-19 DIAGNOSIS — Z510 Encounter for antineoplastic radiation therapy: Secondary | ICD-10-CM | POA: Insufficient documentation

## 2018-03-20 ENCOUNTER — Ambulatory Visit: Payer: 59 | Admitting: Pain Medicine

## 2018-03-20 ENCOUNTER — Ambulatory Visit: Payer: No Typology Code available for payment source

## 2018-03-21 ENCOUNTER — Ambulatory Visit: Payer: No Typology Code available for payment source

## 2018-03-22 ENCOUNTER — Ambulatory Visit: Payer: No Typology Code available for payment source

## 2018-03-22 ENCOUNTER — Telehealth: Payer: Self-pay | Admitting: *Deleted

## 2018-03-23 ENCOUNTER — Ambulatory Visit: Payer: No Typology Code available for payment source

## 2018-03-26 ENCOUNTER — Ambulatory Visit: Payer: No Typology Code available for payment source

## 2018-03-27 ENCOUNTER — Ambulatory Visit: Payer: No Typology Code available for payment source

## 2018-03-27 ENCOUNTER — Ambulatory Visit (INDEPENDENT_AMBULATORY_CARE_PROVIDER_SITE_OTHER): Payer: 59 | Admitting: General Surgery

## 2018-03-27 ENCOUNTER — Other Ambulatory Visit: Payer: Self-pay

## 2018-03-27 ENCOUNTER — Encounter: Payer: Self-pay | Admitting: General Surgery

## 2018-03-27 VITALS — BP 120/81 | HR 94 | Temp 96.8°F | Resp 18 | Ht 64.0 in | Wt 178.0 lb

## 2018-03-27 DIAGNOSIS — C50512 Malignant neoplasm of lower-outer quadrant of left female breast: Secondary | ICD-10-CM

## 2018-03-27 DIAGNOSIS — Z510 Encounter for antineoplastic radiation therapy: Secondary | ICD-10-CM | POA: Diagnosis not present

## 2018-03-27 DIAGNOSIS — Z170 Estrogen receptor positive status [ER+]: Secondary | ICD-10-CM

## 2018-03-27 NOTE — Progress Notes (Signed)
Patient ID: Carolyn Roy, female   DOB: 1965/08/19, 53 y.o.   MRN: [id number]  Chief Complaint  Patient presents with  . Routine Post Op    HPI Carolyn Roy is a 53 y.o. female.  Here for post op left lumpectomy done 01-12-18. No new concerns.Simulation for radiation starts tomorrow. She is here with her significant other, Doug.  HPI  Past Medical History:  Diagnosis Date  . 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 Neuoadjuvant chemotherapy.   . Depression   . Diabetes mellitus without complication ([place]) 9381  . Endometriosis   . Family history of breast cancer   . Headache    migraines  . Hyperlipidemia   . Ovarian mass   . Pneumonia    2018    Past Surgical History:  Procedure Laterality Date  . AXILLARY LYMPH NODE BIOPSY Left 07/14/2017   Procedure: INSERTION GEL MARK CLIP LEFT AXILLA;  Surgeon: Robert Bellow, MD;  Location: [facility] ORS;  Service: General;  Laterality: Left;  . BREAST BIOPSY Left    Dr Orlene Och BREAST METASTATIC CARCINOMA  . BREAST LUMPECTOMY Left 01/12/2018  . OOPHORECTOMY    . PARTIAL MASTECTOMY WITH NEEDLE LOCALIZATION Left 01/12/2018   Procedure: PARTIAL MASTECTOMY WITH NEEDLE LOCALIZATION;  Surgeon: Robert Bellow, MD;  Location: [facility] ORS;  Service: General;  Laterality: Left;  . PORTACATH PLACEMENT Right 07/14/2017   Procedure: INSERTION PORT-A-CATH;  Surgeon: Robert Bellow, MD;  Location: [facility] ORS;  Service: General;  Laterality: Right;  . SENTINEL NODE BIOPSY Left 01/12/2018   Procedure: SENTINEL NODE BIOPSY;  Surgeon: Robert Bellow, MD;  Location: [facility] OR[facility];  Service: General;  Laterality: Left;  . TUBAL LIGATION      Family History  Problem Relation Age of Onset  . Other Father        No info about father or paternal relatives  . Diabetes Brother   . Pancreatitis Brother   . Prostate cancer Brother 32       currently 63 / maternal half-brother  .  Breast cancer Maternal Grandmother 40       deceased 52s  . Breast cancer Maternal Aunt 65       currently 65  . Breast cancer Other 3       mother's sister; deceased 62  . Breast cancer Other        mother's sister; age at dx unknown    Social History Social History   Tobacco Use  . Smoking status: Current Every Day Smoker    Packs/day: 0.50    Years: 11.00    Pack years: 5.50    Types: Cigarettes  . Smokeless tobacco: Former Systems developer    Types: Snuff  Substance Use Topics  . Alcohol use: No    Alcohol/week: 0.0 standard drinks  . Drug use: No    Allergies  Allergen Reactions  . Aspirin Nausea And Vomiting    Current Outpatient Medications  Medication Sig Dispense Refill  . albuterol (PROVENTIL HFA;VENTOLIN HFA) 108 (90 Base) MCG/ACT inhaler Inhale 2 puffs into the lungs every 6 (six) hours as needed for wheezing or shortness of breath. 1 Inhaler 0  . aspirin-acetaminophen-caffeine (EXCEDRIN MIGRAINE) 250-250-65 MG tablet Take 2 tablets by mouth daily as needed for headache.    . Blood Glucose Monitoring Suppl (FIFTY50 GLUCOSE METER 2.0) w/Device KIT Use as directed    . calcium carbonate (CALCIUM 600) 600  MG TABS tablet Take 1 tablet (600 mg total) by mouth 2 (two) times daily with a meal. 60 tablet 0  . canagliflozin (INVOKANA) 300 MG TABS tablet Take 300 mg by mouth daily.    . Cholecalciferol (VITAMIN D3) 125 MCG (5000 UT) CAPS Take 1 capsule (5,000 Units total) by mouth daily with breakfast. Take along with calcium and magnesium. 30 capsule 5  . glucose blood (ONE TOUCH ULTRA TEST) test strip Use up to 4 times/day 100 each 12  . Insulin Glargine (BASAGLAR KWIKPEN) 100 UNIT/ML SOPN INJECT 0.24 MLS (24 UNITS TOTAL) INTO THE SKIN AT BEDTIME. (Patient taking differently: Inject 34 Units into the skin at bedtime. ) 15 pen 2  . Insulin Pen Needle 32G X 4 MM MISC 1 Units by Does not apply route every morning. Pen needles 90 each 3  . lidocaine-prilocaine (EMLA) cream Apply to  affected area once (Patient taking differently: Apply 1 application topically daily as needed (port access). Ap) 30 g 3  . Magnesium 500 MG CAPS Take 1 capsule (500 mg total) by mouth 2 (two) times daily at 8 am and 10 pm. 60 capsule 5  . ondansetron (ZOFRAN) 8 MG tablet Take 1 tablet (8 mg total) by mouth 2 (two) times daily as needed. Start on the third day after chemotherapy. 30 tablet 1  . prochlorperazine (COMPAZINE) 10 MG tablet Take 1 tablet (10 mg total) by mouth every 6 (six) hours as needed (Nausea or vomiting). 30 tablet 1  . traMADol (ULTRAM) 50 MG tablet Take 1 tablet (50 mg total) by mouth every 6 (six) hours as needed for severe pain. 30 tablet 0  . gabapentin (NEURONTIN) 300 MG capsule Take 3 capsules (900 mg total) by mouth 4 (four) times daily. 360 capsule 0  . oxyCODONE (OXY IR/ROXICODONE) 5 MG immediate release tablet Take 1-2 tablets (5-10 mg total) by mouth every 6 (six) hours as needed for severe pain. Must last 30 days. 60 tablet 0   No current facility-administered medications for this visit.    Facility-Administered Medications Ordered in Other Visits  Medication Dose Route Frequency Provider Last Rate Last Dose  . heparin lock flush 100 unit/mL  500 Units Intravenous Once Corcoran, Melissa C, MD      . sodium chloride flush (NS) 0.9 % injection 10 mL  10 mL Intravenous Once Lequita Asal, MD        Review of Systems Review of Systems  Constitutional: Negative.   Respiratory: Negative.   Cardiovascular: Negative.     Blood pressure 120/81, pulse 94, temperature (!) 96.8 F (36 C), temperature source Temporal, resp. rate 18, height '5\' 4"'  (1.626 m), weight 178 lb (80.7 kg), SpO2 98 %.  Physical Exam Physical Exam Constitutional:      Appearance: Normal appearance.  Chest:    Musculoskeletal:       Arms:  Skin:    General: Skin is warm and dry.  Neurological:     Mental Status: She is alert and oriented to person, place, and time.  Psychiatric:         Mood and Affect: Mood normal.     Data Reviewed Upper extremity measurements for March 27, 2018. Right: 33, 26, 21 cm. Left: 34, 26.5, 21 cm.  Assessment    Doing well post wide excision with resolution of wound irritation.    Plan    Please try not to rub wound, but pat area dry after showering, apply Vaseline or triple antibiotic ointment  [place] up in 2 months  The patient reports that radiation oncology is instructed her not to return to work until after RT is completed.  The patient is scheduled for her CT simulation tomorrow.  She was advised that she may experience some tiredness in the weeks after RT has been initiated and that this should not alarm her but she should be as active as she is able.  HPI, assessment, plan and physical exam has been scribed under the direction and in the presence of Robert Bellow, MD. Karie Fetch, RN  I have completed the exam and reviewed the above documentation for accuracy and completeness.  I agree with the above.  Haematologist has been used and any errors in dictation or transcription are unintentional.  Hervey Ard, M.D., F.A.C.S. [name] [place] [facility] 03/27/2018, 10:37 AM

## 2018-03-27 NOTE — Patient Instructions (Addendum)
Please try not to rub wound, but pat area dry after showering, apply Vaseline or triple antibiotic ointment   Follow up in 2 months

## 2018-03-28 ENCOUNTER — Ambulatory Visit: Payer: 59 | Admitting: Pain Medicine

## 2018-03-28 ENCOUNTER — Ambulatory Visit
Admission: RE | Admit: 2018-03-28 | Discharge: 2018-03-28 | Disposition: A | Discharge status: Home / Self Care | Payer: No Typology Code available for payment source | Source: Ambulatory Visit | Attending: Radiation Oncology | Admitting: Radiation Oncology

## 2018-03-28 ENCOUNTER — Ambulatory Visit: Payer: No Typology Code available for payment source

## 2018-03-28 DIAGNOSIS — Z510 Encounter for antineoplastic radiation therapy: Secondary | ICD-10-CM | POA: Diagnosis not present

## 2018-03-29 ENCOUNTER — Ambulatory Visit: Payer: No Typology Code available for payment source

## 2018-03-29 ENCOUNTER — Ambulatory Visit
Admission: RE | Admit: 2018-03-29 | Discharge: 2018-03-29 | Disposition: A | Discharge status: Home / Self Care | Payer: No Typology Code available for payment source | Source: Ambulatory Visit | Attending: Radiation Oncology | Admitting: Radiation Oncology

## 2018-03-29 DIAGNOSIS — Z510 Encounter for antineoplastic radiation therapy: Secondary | ICD-10-CM | POA: Diagnosis not present

## 2018-03-30 ENCOUNTER — Ambulatory Visit: Payer: No Typology Code available for payment source

## 2018-03-30 ENCOUNTER — Ambulatory Visit
Admission: RE | Admit: 2018-03-30 | Discharge: 2018-03-30 | Disposition: A | Discharge status: Home / Self Care | Payer: No Typology Code available for payment source | Source: Ambulatory Visit | Attending: Radiation Oncology | Admitting: Radiation Oncology

## 2018-03-30 DIAGNOSIS — Z510 Encounter for antineoplastic radiation therapy: Secondary | ICD-10-CM | POA: Diagnosis not present

## 2018-04-02 ENCOUNTER — Ambulatory Visit: Payer: No Typology Code available for payment source

## 2018-04-02 ENCOUNTER — Ambulatory Visit
Admission: RE | Admit: 2018-04-02 | Discharge: 2018-04-02 | Disposition: A | Discharge status: Home / Self Care | Payer: No Typology Code available for payment source | Source: Ambulatory Visit | Attending: Radiation Oncology | Admitting: Radiation Oncology

## 2018-04-02 DIAGNOSIS — Z510 Encounter for antineoplastic radiation therapy: Secondary | ICD-10-CM | POA: Diagnosis not present

## 2018-04-03 ENCOUNTER — Ambulatory Visit
Admission: RE | Admit: 2018-04-03 | Discharge: 2018-04-03 | Disposition: A | Discharge status: Home / Self Care | Payer: 59 | Source: Ambulatory Visit | Attending: Pain Medicine | Admitting: Pain Medicine

## 2018-04-03 ENCOUNTER — Other Ambulatory Visit: Payer: Self-pay | Admitting: *Deleted

## 2018-04-03 ENCOUNTER — Ambulatory Visit: Payer: No Typology Code available for payment source

## 2018-04-03 ENCOUNTER — Ambulatory Visit
Admission: RE | Admit: 2018-04-03 | Discharge: 2018-04-03 | Disposition: A | Discharge status: Home / Self Care | Payer: No Typology Code available for payment source | Source: Ambulatory Visit | Attending: Radiation Oncology | Admitting: Radiation Oncology

## 2018-04-03 DIAGNOSIS — Z853 Personal history of malignant neoplasm of breast: Secondary | ICD-10-CM

## 2018-04-03 DIAGNOSIS — G8929 Other chronic pain: Secondary | ICD-10-CM | POA: Insufficient documentation

## 2018-04-03 DIAGNOSIS — C50512 Malignant neoplasm of lower-outer quadrant of left female breast: Secondary | ICD-10-CM

## 2018-04-03 DIAGNOSIS — Z510 Encounter for antineoplastic radiation therapy: Secondary | ICD-10-CM | POA: Diagnosis not present

## 2018-04-03 DIAGNOSIS — M25561 Pain in right knee: Secondary | ICD-10-CM | POA: Diagnosis not present

## 2018-04-03 DIAGNOSIS — M1711 Unilateral primary osteoarthritis, right knee: Secondary | ICD-10-CM

## 2018-04-03 DIAGNOSIS — Z170 Estrogen receptor positive status [ER+]: Principal | ICD-10-CM

## 2018-04-04 ENCOUNTER — Ambulatory Visit
Admission: RE | Admit: 2018-04-04 | Discharge: 2018-04-04 | Disposition: A | Discharge status: Home / Self Care | Payer: No Typology Code available for payment source | Source: Ambulatory Visit | Attending: Radiation Oncology | Admitting: Radiation Oncology

## 2018-04-04 ENCOUNTER — Ambulatory Visit: Payer: No Typology Code available for payment source

## 2018-04-04 DIAGNOSIS — Z510 Encounter for antineoplastic radiation therapy: Secondary | ICD-10-CM | POA: Diagnosis not present

## 2018-04-05 ENCOUNTER — Ambulatory Visit: Payer: No Typology Code available for payment source

## 2018-04-06 ENCOUNTER — Ambulatory Visit: Payer: No Typology Code available for payment source

## 2018-04-06 ENCOUNTER — Ambulatory Visit
Admission: RE | Admit: 2018-04-06 | Discharge: 2018-04-06 | Disposition: A | Discharge status: Home / Self Care | Payer: No Typology Code available for payment source | Source: Ambulatory Visit | Attending: Radiation Oncology | Admitting: Radiation Oncology

## 2018-04-08 NOTE — Progress Notes (Addendum)
Patient's Name: Carolyn Roy  MRN: [id number]  Referring Provider: No ref. provider found  DOB: 01-11-1966  PCP: Patient, No Pcp Per  DOS: 04/09/2018  Note by: Gaspar Cola, MD  Service setting: Ambulatory outpatient  Specialty: Interventional Pain Management  Location: [facility] (AMB) Pain Management Facility    Patient type: Established   Primary Reason(s) for Visit: Evaluation of chronic illnesses with exacerbation, or progression (Level of risk: moderate) CC: Back Pain (lower); Knee Pain (bilateral); and Hip Pain (left)  HPI  Carolyn Roy is a 53 y.o. year old, female patient, who comes today for a follow-up evaluation. She has Poorly controlled type 2 diabetes mellitus ([place]); Chronic fatigue; Severe depression ([place]); Knee pain, left; Tobacco abuse; Depression, major, single episode, severe ([place]); Hypercholesterolemia; Stage 1 mild COPD by GOLD classification ([place]); Cancer of midline of breast, left ([place]); Family history of breast cancer; Cervical polyp; Goals of care, counseling/discussion; Encounter for antineoplastic chemotherapy; Hyperglycemia; Chemotherapy-induced neutropenia ([place]); Chemotherapy-induced neuropathy ([place]); Malignant neoplasm of lower-outer quadrant of left breast of female, estrogen receptor positive ([place]); Burning sensation of feet (Bilateral); Numbness and tingling in hands (Bilateral); Chronic feet pain (Primary Area of Pain) (Bilateral) (R>L); Neuropathic pain of feet (Bilateral); Chronic knee pain (Secondary Area of Pain) (Bilateral) (R>L); Chronic hand pain (Tertiary Area of Pain) (Bilateral) (R>L); Chronic pain syndrome; Pharmacologic therapy; Disorder of skeletal system; Problems influencing health status; Chronic hip pain (Fourth Area of Pain) (Bilateral) (R>L); Type 2 diabetes mellitus with hyperglycemia, with long-term current use of insulin ([place]); Chronic upper extremity pain (Tertiary Area of Pain) (Bilateral) (R>L); Cancer-related pain; Vitamin D deficiency;  Neuropathic pain; Osteoarthritis of knee (Right); Chronic low back pain (Bilateral (L>R) w/o sciatica; Chronic hip pain (Left); Lumbar facet syndrome (Bilateral) (L>R); Idiopathic scoliosis; Chronic sacroiliac joint pain (Left); Chronic pain of knee (Right); and History of breast cancer on their problem list. Carolyn Roy was last seen on 03/14/2018. Her primarily concern today is the Back Pain (lower); Knee Pain (bilateral); and Hip Pain (left)  Pain Assessment: Location: Lower Back Radiating: denies Onset: More than a month ago Duration: Chronic pain Quality: Aching, Dull, Sharp Severity: 5 /10 (subjective, self-reported pain score)  Note: Reported level is compatible with observation.                         When using our objective Pain Scale, levels between 6 and 10/10 are said to belong in an emergency room, as it progressively worsens from a 6/10, described as severely limiting, requiring emergency care not usually available at an outpatient pain management facility. At a 6/10 level, communication becomes difficult and requires great effort. Assistance to reach the emergency department may be required. Facial flushing and profuse sweating along with potentially dangerous increases in heart rate and blood pressure will be evident. Timing: Constant Modifying factors: sitting in recliner BP: 105/81  HR: 84  Today the patient returns for follow-up after having had the x-rays of her lumbar spine, hip joint, and sacroiliac joints done.  In addition, she had an MRI of the knee that confirms that she has tricompartmental osteoarthritis with loss of cartilage.  Today I have offered the patient a series of 5, right-sided intra-articular Hyalgan knee injections.  I have entered a PRN order since the patient is still getting some benefit from the steroid injection that we did last time.  I went ahead and reviewed the results of the x-rays with the patient and I interpreted them in layman's  terms.  However, I  disagree with the radiologist interpretation of the lumbar x-rays were he indicated that there is no significant facet problems.  Upon personally reviewing the x-rays, I see that there is facet arthropathy affecting the L4-5 and L5-S1, with a left L5-S1 being worse.  I will be ordering an MRI of the lumbar spine to evaluate her persistent low back pain especially in view of the fact that she has a history of breast cancer.  She is still taking her Neurontin for the chemotherapy-induced peripheral neuropathy.  She is also taking the oxycodone which she indicates has not caused her any significant problems except when she takes it without food.  When she does, he gets nauseous.  She is also having some constipation, but when I offered some medication for this she indicated that she was doing okay with the over-the-counter medications that she was taking and her diet.  Her diagnostic lumbar facet block was denied by her insurance [employer] (histocompatibility and immunogenetics)).  The reason why is not clear to me.  However, since she continues to have low back pain, I will be referring her to physical therapy.  I will also order the MRI to see if this clarifies the issues of the lumbar facet arthropathy.  In addition, I will be looking for any possible metastatic pathology.  I have provided the patient with 3 months refills on her medications and from now on, she will have those evaluated and refilled by our nurse practitioner Lanell Persons, NP.  Start having problems with her right knee again, we will proceed with the Hyalgan knee injections.  I will like to see her back after her MRI to go over the results with her.  Further details on both, my assessment(s), as well as the proposed treatment plan, please see below.  Laboratory Chemistry  Inflammation Markers (CRP: Acute Phase) (ESR: Chronic Phase) Lab Results  Component Value Date   CRP 7 01/22/2018   ESRSEDRATE 27 01/22/2018                         Rheumatology Markers Lab Results   Component Value Date   LABURIC 3.3 03/30/2015                        Renal Function Markers Lab Results  Component Value Date   BUN 9 02/02/2018   CREATININE 0.46 02/02/2018   BCR 16 01/22/2018   GFRAA >60 02/02/2018   GFRNONAA >60 02/02/2018                             Hepatic Function Markers Lab Results  Component Value Date   AST 15 02/02/2018   ALT 14 02/02/2018   ALBUMIN 3.9 02/02/2018   ALKPHOS 89 02/02/2018                        Electrolytes Lab Results  Component Value Date   NA 135 02/02/2018   K 3.8 02/02/2018   CL 102 02/02/2018   CALCIUM 9.1 02/02/2018   MG 1.9 11/23/2017                        Neuropathy Markers Lab Results  Component Value Date   VITAMINB12 637 12/13/2017   FOLATE 20.5 12/13/2017   HGBA1C 8.8 (H) 06/02/2017   HIV Non Reactive 07/01/2015  CNS Tests No results found.  Bone Pathology Markers Lab Results  Component Value Date   VD25OH 20.0 (L) 12/13/2017                         Coagulation Parameters Lab Results  Component Value Date   PLT 261 02/02/2018                        Cardiovascular Markers Lab Results  Component Value Date   HGB 14.1 02/02/2018   HCT 43.5 02/02/2018                         CA Markers No results found.  Endocrine Markers Lab Results  Component Value Date   TSH 3.452 12/13/2017                        Note: Lab results reviewed.  Imaging Review  Lumbosacral Imaging: Lumbar DG Bending views:  Results for orders placed during the hospital encounter of 03/14/18  DG Lumbar Spine Complete W/Bend   Narrative CLINICAL DATA:  Lumbago with radicular symptoms. History of breast carcinoma  EXAM: LUMBAR SPINE - COMPLETE WITH BENDING VIEWS  COMPARISON:  None.  FINDINGS: Standing frontal, standing neutral lateral, standing flexion lateral, standing extension lateral, spot lumbosacral lateral, and bilateral oblique views were obtained. There are 5  non-rib-bearing lumbar type vertebral bodies. There is no appreciable fracture. There is no evident spondylolisthesis. There is no appreciable change in lateral alignment between neutral lateral, flexion lateral, and extension lateral positioning. Disc spaces appear unremarkable. There is no appreciable facet arthropathy. No blastic or lytic bone lesions evident.  IMPRESSION: No fracture or spondylolisthesis. No change in lateral alignment between neutral lateral, flexion lateral, and extension lateral positioning. No appreciable arthropathic changes.   Electronically Signed   By: Lowella Grip III M.D.   On: 03/14/2018 11:50    Sacroiliac Joint Imaging: Sacroiliac Joint DG:  Results for orders placed during the hospital encounter of 03/14/18  DG Si Joints   Narrative CLINICAL DATA:  Pain.  History of breast carcinoma  EXAM: BILATERAL SACROILIAC JOINTS - 3+ VIEW  COMPARISON:  None.  FINDINGS: Frontal and bilateral oblique views were obtained. No fracture or diastasis. Joint spaces appear unremarkable. No erosive change or sacroiliitis.  IMPRESSION: No fracture or diastases.  No evident arthropathy.   Electronically Signed   By: Lowella Grip III M.D.   On: 03/14/2018 11:52    Hip Imaging: Hip-L DG 2-3 views:  Results for orders placed during the hospital encounter of 03/14/18  DG HIP UNILAT W OR W/O PELVIS 2-3 VIEWS LEFT   Narrative CLINICAL DATA:  Pain.  History of breast carcinoma  EXAM: DG HIP (WITH OR WITHOUT PELVIS) 2-3V LEFT  COMPARISON:  None.  FINDINGS: Frontal pelvis as well as frontal and lateral left hip images obtained. There is no acute fracture or dislocation. Joint spaces appear normal. There is a focal loose body in the lateral left hip joint measuring approximately 1 x 1 cm. No erosive changes. There is postoperative change in the left pelvis. No blastic or lytic bone lesions.  IMPRESSION: Approximately 1 cm loose body in the  left lateral hip joint. No appreciable joint space narrowing or erosion. No fracture or dislocation.   Electronically Signed   By: Lowella Grip III M.D.   On: 03/14/2018 11:51    Knee Imaging:  Knee-R MR wo contrast:  Results for orders placed during the hospital encounter of 04/03/18  MR KNEE RIGHT WO CONTRAST   Narrative CLINICAL DATA:  Right knee pain all around the joint. Unstable when walking.  EXAM: MRI OF THE RIGHT KNEE WITHOUT CONTRAST  TECHNIQUE: Multiplanar, multisequence MR imaging of the knee was performed. No intravenous contrast was administered.  COMPARISON:  None.  FINDINGS: MENISCI  Medial meniscus:  Intact.  Lateral meniscus:  Intact.  LIGAMENTS  Cruciates:  Intact ACL and PCL.  Collaterals: Medial collateral ligament is intact. Lateral collateral ligament complex is intact.  CARTILAGE  Patellofemoral: Partial-thickness cartilage loss of the patellar apex extending into the medial patellar facet. Partial-thickness cartilage loss of the trochlear groove.  Medial: High-grade partial-thickness cartilage loss of weight-bearing surface of the medial femoral condyle.  Lateral:  Small cartilage fissure of lateral femoral condyle.  Joint:  No joint effusion. Normal Hoffa's fat. No plical thickening.  Popliteal Fossa: Intact popliteus tendon. No significant Baker's cyst.  Extensor Mechanism: Intact quadriceps tendon. Intact patellar tendon. Intact medial patellar retinaculum. Intact lateral patellar retinaculum. Intact MPFL.  Bones: No focal marrow signal abnormality. No fracture or dislocation.  Other: No fluid collection or hematoma.  Muscles are normal.  IMPRESSION: 1. No meniscal or ligamentous injury of the right knee. 2. Tricompartmental cartilage abnormalities as described above.   Electronically Signed   By: Kathreen Devoid   On: 04/03/2018 13:50    Knee-R DG 1-2 views:  Results for orders placed during the hospital  encounter of 01/22/18  DG Knee 1-2 Views Right   Narrative CLINICAL DATA:  53 year old female with a history of right knee pain  EXAM: RIGHT KNEE - 1-2 VIEW  COMPARISON:  None.  FINDINGS: No acute displaced fracture. No joint effusion. Mild degenerative changes of the mediolateral compartment and the patellofemoral compartment. No radiopaque foreign body. Questionable prepatellar soft tissue swelling  IMPRESSION: Negative for acute bony abnormality.  Questionable prepatellar soft tissue swelling.  Mild degenerative changes   Electronically Signed   By: Corrie Mckusick D.O.   On: 01/22/2018 14:06    Complexity Note: Imaging results reviewed. Today I personally and independently reviewed the study images pertinent to Ms. White's problem.       Images reviewed using the PACS system hyperlink. I have personally examined the images. I disagree with the Radiologist's report. The following observed findings are of concern to me: there is lumbar facet arthropathy affecting L4-5 and L5-S1, with the left side being worse at the L5-S1 level.  Meds   Current Outpatient Medications:  .  albuterol (PROVENTIL HFA;VENTOLIN HFA) 108 (90 Base) MCG/ACT inhaler, Inhale 2 puffs into the lungs every 6 (six) hours as needed for wheezing or shortness of breath., Disp: 1 Inhaler, Rfl: 0 .  aspirin-acetaminophen-caffeine (EXCEDRIN MIGRAINE) 250-250-65 MG tablet, Take 2 tablets by mouth daily as needed for headache., Disp: , Rfl:  .  Blood Glucose Monitoring Suppl (FIFTY50 GLUCOSE METER 2.0) w/Device KIT, Use as directed, Disp: , Rfl:  .  canagliflozin (INVOKANA) 300 MG TABS tablet, Take 300 mg by mouth daily., Disp: , Rfl:  .  Cholecalciferol (VITAMIN D3) 125 MCG (5000 UT) CAPS, Take 1 capsule (5,000 Units total) by mouth daily with breakfast. Take along with calcium and magnesium., Disp: 30 capsule, Rfl: 5 .  [START ON 05/09/2018] gabapentin (NEURONTIN) 300 MG capsule, Take 3 capsules (900 mg total) by  mouth 4 (four) times daily., Disp: 360 capsule, Rfl: 2 .  glucose blood (ONE  TOUCH ULTRA TEST) test strip, Use up to 4 times/day, Disp: 100 each, Rfl: 12 .  Insulin Glargine (BASAGLAR KWIKPEN) 100 UNIT/ML SOPN, INJECT 0.24 MLS (24 UNITS TOTAL) INTO THE SKIN AT BEDTIME. (Patient taking differently: Inject 44 Units into the skin at bedtime. ), Disp: 15 pen, Rfl: 2 .  Insulin Pen Needle 32G X 4 MM MISC, 1 Units by Does not apply route every morning. Pen needles, Disp: 90 each, Rfl: 3 .  lidocaine-prilocaine (EMLA) cream, Apply to affected area once (Patient taking differently: Apply 1 application topically daily as needed (port access). Ap), Disp: 30 g, Rfl: 3 .  Magnesium 500 MG CAPS, Take 1 capsule (500 mg total) by mouth 2 (two) times daily at 8 am and 10 pm., Disp: 60 capsule, Rfl: 5 .  ondansetron (ZOFRAN) 8 MG tablet, Take 1 tablet (8 mg total) by mouth 2 (two) times daily as needed. Start on the third day after chemotherapy., Disp: 30 tablet, Rfl: 1 .  oxyCODONE (OXY IR/ROXICODONE) 5 MG immediate release tablet, Take 1-2 tablets (5-10 mg total) by mouth every 6 (six) hours as needed for up to 30 days for severe pain. Must last 30 days., Disp: 60 tablet, Rfl: 0 .  prochlorperazine (COMPAZINE) 10 MG tablet, Take 1 tablet (10 mg total) by mouth every 6 (six) hours as needed (Nausea or vomiting)., Disp: 30 tablet, Rfl: 1 .  calcium carbonate (CALCIUM 600) 600 MG TABS tablet, Take 1 tablet (600 mg total) by mouth 2 (two) times daily with a meal., Disp: 60 tablet, Rfl: 0 .  [START ON 06/08/2018] oxyCODONE (OXY IR/ROXICODONE) 5 MG immediate release tablet, Take 1 tablet (5 mg total) by mouth every 4 (four) hours as needed for up to 30 days for severe pain., Disp: 60 tablet, Rfl: 0 .  [START ON 05/09/2018] oxyCODONE (OXY IR/ROXICODONE) 5 MG immediate release tablet, Take 1-2 tablets (5-10 mg total) by mouth every 6 (six) hours as needed for up to 30 days for severe pain., Disp: 60 tablet, Rfl: 0 No current  facility-administered medications for this visit.   Facility-Administered Medications Ordered in Other Visits:  .  heparin lock flush 100 unit/mL, 500 Units, Intravenous, Once, Corcoran, Melissa C, MD .  sodium chloride flush (NS) 0.9 % injection 10 mL, 10 mL, Intravenous, Once, Corcoran, Melissa C, MD  ROS  Constitutional: Denies any fever or chills Gastrointestinal: No reported hemesis, hematochezia, vomiting, or acute GI distress Musculoskeletal: Denies any acute onset joint swelling, redness, loss of ROM, or weakness Neurological: No reported episodes of acute onset apraxia, aphasia, dysarthria, agnosia, amnesia, paralysis, loss of coordination, or loss of consciousness  Allergies  Ms. White is allergic to aspirin.  PFSH  Drug: Carolyn Roy  reports no history of drug use. Alcohol:  reports no history of alcohol use. Tobacco:  reports that she has been smoking cigarettes. She has a 5.50 pack-year smoking history. She has quit using smokeless tobacco.  Her smokeless tobacco use included snuff. Medical:  has a past medical history of Cancer ([place]) (06/15/2017), Depression, Diabetes mellitus without complication ([place]) (4270), Endometriosis, Family history of breast cancer, Headache, Hyperlipidemia, Ovarian mass, and Pneumonia. Surgical: Carolyn Roy  has a past surgical history that includes Oophorectomy; Tubal ligation; Portacath placement (Right, 07/14/2017); Axillary lymph node biopsy (Left, 07/14/2017); Breast lumpectomy (Left, 01/12/2018); Breast biopsy (Left); Partial mastectomy with needle localization (Left, 01/12/2018); and Sentinel node biopsy (Left, 01/12/2018). Family: family history includes Breast cancer in an other family member; Breast cancer (age of onset:  77) in her maternal grandmother; Breast cancer (age of onset: 88) in an other family member; Breast cancer (age of onset: 67) in her maternal aunt; Diabetes in her brother; Other in her father; Pancreatitis in her brother; Prostate cancer  (age of onset: 46) in her brother.  Constitutional Exam  General appearance: Well nourished, well developed, and well hydrated. In no apparent acute distress Vitals:   04/09/18 0824  BP: 105/81  Pulse: 84  Resp: 16  Temp: 97.6 F (36.4 C)  TempSrc: Oral  SpO2: 98%  Weight: 178 lb (80.7 kg)  Height: '5\' 3"'  (1.6 m)   BMI Assessment: Estimated body mass index is 31.53 kg/m as calculated from the following:   Height as of this encounter: '5\' 3"'  (1.6 m).   Weight as of this encounter: 178 lb (80.7 kg).  BMI interpretation table: BMI level Category Range association with higher incidence of chronic pain  <18 kg/m2 Underweight   18.5-24.9 kg/m2 Ideal body weight   25-29.9 kg/m2 Overweight Increased incidence by 20%  30-34.9 kg/m2 Obese (Class I) Increased incidence by 68%  35-39.9 kg/m2 Severe obesity (Class II) Increased incidence by 136%  >40 kg/m2 Extreme obesity (Class III) Increased incidence by 254%   Patient's current BMI Ideal Body weight  Body mass index is 31.53 kg/m. Ideal body weight: 52.4 kg (115 lb 8.3 oz) Adjusted ideal body weight: 63.7 kg (140 lb 8.2 oz)   BMI Readings from Last 4 Encounters:  04/09/18 31.53 kg/m  03/27/18 30.55 kg/m  03/14/18 30.38 kg/m  03/13/18 30.48 kg/m   Wt Readings from Last 4 Encounters:  04/09/18 178 lb (80.7 kg)  03/27/18 178 lb (80.7 kg)  03/14/18 177 lb (80.3 kg)  03/13/18 177 lb 9.6 oz (80.6 kg)  Psych/Mental status: Alert, oriented x 3 (person, place, & time)       Eyes: PERLA Respiratory: No evidence of acute respiratory distress  Cervical Spine Area Exam  Skin & Axial Inspection: No masses, redness, edema, swelling, or associated skin lesions Alignment: Symmetrical Functional ROM: Unrestricted ROM      Stability: No instability detected Muscle Tone/Strength: Functionally intact. No obvious neuro-muscular anomalies detected. Sensory (Neurological): Unimpaired Palpation: No palpable anomalies              Upper  Extremity (UE) Exam    Side: Right upper extremity  Side: Left upper extremity  Skin & Extremity Inspection: Skin color, temperature, and hair growth are WNL. No peripheral edema or cyanosis. No masses, redness, swelling, asymmetry, or associated skin lesions. No contractures.  Skin & Extremity Inspection: Skin color, temperature, and hair growth are WNL. No peripheral edema or cyanosis. No masses, redness, swelling, asymmetry, or associated skin lesions. No contractures.  Functional ROM: Unrestricted ROM          Functional ROM: Unrestricted ROM          Muscle Tone/Strength: Functionally intact. No obvious neuro-muscular anomalies detected.  Muscle Tone/Strength: Functionally intact. No obvious neuro-muscular anomalies detected.  Sensory (Neurological): Unimpaired          Sensory (Neurological): Unimpaired          Palpation: No palpable anomalies              Palpation: No palpable anomalies              Provocative Test(s):  Phalen's test: deferred Tinel's test: deferred Apley's scratch test (touch opposite shoulder):  Action 1 (Across chest): deferred Action 2 (Overhead): deferred Action 3 (LB reach): deferred  Provocative Test(s):  Phalen's test: deferred Tinel's test: deferred Apley's scratch test (touch opposite shoulder):  Action 1 (Across chest): deferred Action 2 (Overhead): deferred Action 3 (LB reach): deferred    Thoracic Spine Area Exam  Skin & Axial Inspection: No masses, redness, or swelling Alignment: Symmetrical Functional ROM: Unrestricted ROM Stability: No instability detected Muscle Tone/Strength: Functionally intact. No obvious neuro-muscular anomalies detected. Sensory (Neurological): Unimpaired Muscle strength & Tone: No palpable anomalies  Lumbar Spine Area Exam  Skin & Axial Inspection: No masses, redness, or swelling Alignment: Symmetrical Functional ROM: Unrestricted ROM       Stability: No instability detected Muscle Tone/Strength: Functionally  intact. No obvious neuro-muscular anomalies detected. Sensory (Neurological): Unimpaired Palpation: No palpable anomalies       Provocative Tests: Hyperextension/rotation test: deferred today       Lumbar quadrant test (Kemp's test): deferred today       Lateral bending test: deferred today       Patrick's Maneuver: deferred today                   FABER* test: deferred today                   S-I anterior distraction/compression test: deferred today         S-I lateral compression test: deferred today         S-I Thigh-thrust test: deferred today         S-I Gaenslen's test: deferred today         *(Flexion, ABduction and External Rotation)  Gait & Posture Assessment  Ambulation: Unassisted Gait: Relatively normal for age and body habitus Posture: WNL   Lower Extremity Exam    Side: Right lower extremity  Side: Left lower extremity  Stability: No instability observed          Stability: No instability observed          Skin & Extremity Inspection: Skin color, temperature, and hair growth are WNL. No peripheral edema or cyanosis. No masses, redness, swelling, asymmetry, or associated skin lesions. No contractures.  Skin & Extremity Inspection: Skin color, temperature, and hair growth are WNL. No peripheral edema or cyanosis. No masses, redness, swelling, asymmetry, or associated skin lesions. No contractures.  Functional ROM: Unrestricted ROM                  Functional ROM: Unrestricted ROM                  Muscle Tone/Strength: Functionally intact. No obvious neuro-muscular anomalies detected.  Muscle Tone/Strength: Functionally intact. No obvious neuro-muscular anomalies detected.  Sensory (Neurological): Unimpaired        Sensory (Neurological): Unimpaired        DTR: Patellar: deferred today Achilles: deferred today Plantar: deferred today  DTR: Patellar: deferred today Achilles: deferred today Plantar: deferred today  Palpation: No palpable anomalies  Palpation: No palpable  anomalies   Assessment   Status Diagnosis  Persistent Persistent Persistent 1. Lumbar facet syndrome (Bilateral) (L>R)   2. Chronic sacroiliac joint pain (Left)   3. Chronic feet pain (Primary Area of Pain) (Bilateral) (R>L)   4. Chemotherapy-induced neuropathy ([place])   5. Chronic knee pain (Secondary Area of Pain) (Bilateral) (R>L)   6. Chronic hand pain (Tertiary Area of Pain) (Bilateral) (R>L)   7. Chronic hip pain (Fourth Area of Pain) (Bilateral) (R>L)   8. Chronic pain of knee (Right)   9. Chronic low back pain (Bilateral (L>R) w/o  sciatica   10. Neuropathic pain of feet (Bilateral)   11. Neuropathic pain   12. Chronic pain syndrome   13. Cancer-related pain      Updated Problems: No problems updated.  Plan of Care  Pharmacotherapy (Medications Ordered): Meds ordered this encounter  Medications  . gabapentin (NEURONTIN) 300 MG capsule    Sig: Take 3 capsules (900 mg total) by mouth 4 (four) times daily.    Dispense:  360 capsule    Refill:  2    Do not place medication on "Automatic Refill". Fill one day early if pharmacy is closed on scheduled refill date.  [name] oxyCODONE (OXY IR/ROXICODONE) 5 MG immediate release tablet    Sig: Take 1-2 tablets (5-10 mg total) by mouth every 6 (six) hours as needed for up to 30 days for severe pain. Must last 30 days.    Dispense:  60 tablet    Refill:  0    [place] STOP ACT - Not applicable. Fill one day early if pharmacy is closed on scheduled refill date. Do not fill until: 04/09/18. Must last 30 days. To last until: 05/09/18.  [name] oxyCODONE (OXY IR/ROXICODONE) 5 MG immediate release tablet    Sig: Take 1 tablet (5 mg total) by mouth every 4 (four) hours as needed for up to 30 days for severe pain.    Dispense:  60 tablet    Refill:  0    [place] STOP ACT - Not applicable to Chronic Pain Syndrome (G89.4) diagnosis. Fill one day early if pharmacy is closed on scheduled refill date. Do not fill until: 06/08/18. To last until: 07/08/18.  [name]  oxyCODONE (OXY IR/ROXICODONE) 5 MG immediate release tablet    Sig: Take 1-2 tablets (5-10 mg total) by mouth every 6 (six) hours as needed for up to 30 days for severe pain.    Dispense:  60 tablet    Refill:  0    [place] STOP ACT - Not applicable to Chronic Pain Syndrome (G89.4) diagnosis. Fill one day early if pharmacy is closed on scheduled refill date. Do not fill until: 05/09/18. To last until: 06/08/18.   Medications administered today: Flint Melter. White had no medications administered during this visit.  Orders:  Orders Placed This Encounter  Procedures  . KNEE INJECTION    For knee pain. Hyalgan Knee injection(s). Please order hyalgan to be available.    Standing Status:   Standing    Number of Occurrences:   5    Standing Expiration Date:   04/09/2019    Scheduling Instructions:     Procedure: Intra-articular Hyalgan Knee injection #1     Side(s): Right Knee     Sedation: None     TIMEFRAME: PRN procedure. (Ms. White will call when needed.)    Order Specific Question:   Where will this procedure be performed?    Answer:   [facility]  . MR LUMBAR SPINE WO CONTRAST    In addition to any acute findings, please report on degenerative changes related to: (Please specify level(s)) (1) ROM & instability (>74m displacement) (2) Facet joint (Zygoapophyseal Joint) (3) DDD and/or IVDD (4) Pars defects (5) Previous surgical changes (Include description of hardware and hardware status, if present) (6) Presence and degree of spondylolisthesis, spondylosis, and/or spondyloarthropathies)  (7) Old Fractures (8) Demineralization (9) Additional bone pathology (10) Stenosis (Central, Lateral Recess, Foraminal) (11) If at all possible, please provide AP diameter (mm) of foraminal and/or central canal.    Standing Status:  Future    Standing Expiration Date:   07/10/2018    Order Specific Question:   What is the patient's sedation requirement?    Answer:   No Sedation    Order  Specific Question:   Does the patient have a pacemaker or implanted devices?    Answer:   No    Order Specific Question:   Preferred imaging location?    Answer:   [facility] (table limit-350lbs)    Order Specific Question:   Call Results- Best Contact Number?    Answer:   [phone] ([facility])    Order Specific Question:   Radiology Contrast Protocol - do NOT remove file path    Answer:   [email or web address]    Order Specific Question:   ** REASON FOR EXAM (FREE TEXT)    Answer:   History of breast Cancer & suspected L4-5 & L5-S1 lumbar facet arthropathy  . Ambulatory referral to Physical Therapy    Referral Priority:   Routine    Referral Type:   Physical Medicine    Referral Reason:   Specialty Services Required    Requested Specialty:   Physical Therapy    Number of Visits Requested:   1  . Nursing Instructions:    Scheduling Instructions:     1. Medication Agreement: Please go over agreement with the patient. Have the patient read and sign the agreement. Provide patient with a copy of the signed agreement.     2. Make sure that the patient has completed the ORT (Opioid Risk Tool).     3. Provide the patient with a copy of our "Medicatiion Policy", "Medication Recommendations and Reminders", and "CBD information".     4. Remind the patient to always bring their medications and medication bottles (even if empty) to all appointments except for procedure appointments.  . Nursing communication    Scheduling Instructions:     Complete the opioid risk tool (ORT) questionnaire.  . Schedule appointment    Scheduling Instructions:     Schedule the patient to have all medication management appointments and medication refills with Janice Norrie, NP   Lab Orders  No laboratory test(s) ordered today    Imaging Orders     MR LUMBAR SPINE WO CONTRAST  Referral Orders     Ambulatory referral to Physical Therapy Interventional management  options: Planned follow-up:   Today I will be ordering an MRI of the lumbar spine.  I have personally reviewed the results of the x-rays done in the lower back and I disagree with the interpretation that there is no significant facet arthropathy.  I do see arthropathy affecting the L4-5 and L5-S1 with the L5-S1 being worse on the left side.  This matches the patient's clinical symptoms. Plan: Return in about 3 months (around 07/10/2018) for Med-Mgmt, w/ Dionisio David, NP, F/U eval (after test completion), w/ Dr. Dossie Arbour.   Considering:   Diagnostic bilateral lumbar facet block #1  (initially denied by insurance company, due to unknown reasons)  Diagnostic sympathetic nerve block  Diagnostic intra-articular right knee injection  Diagnostic right knee Hyalgan series #1    Palliative PRN treatment(s):   None at this time   Future Appointments  Date Time Provider [place]  04/19/2018 10:30 AM [facility]PORT FLUSH [facility] None  04/26/2018 11:15 AM [facility]-RO CT SIM [facility] None  05/03/2018 10:45 AM [facility]-MO LAB [facility] None  05/09/2018  9:30 AM Milinda Pointer, MD [facility] None  05/10/2018 11:00 AM [facility]-RO LINAC 1 [facility] None  05/11/2018 11:00 AM [facility]-RO LINAC 1 [facility] None  05/14/2018 11:00 AM [facility]-RO LINAC 1 [facility] None  05/15/2018 11:00 AM [facility]-RO LINAC 1 [facility] None  05/16/2018 11:00 AM [facility]-RO LINAC 1 [facility] None  05/17/2018 11:00 AM [facility]-RO LINAC 1 [facility] None  05/18/2018 11:00 AM [facility]-RO LINAC 1 [facility] None  05/21/2018 11:00 AM [facility]-RO LINAC 1 [facility] None  05/24/2018  9:15 AM [name], [name], MD [facility] None  05/30/2018  1:30 PM [facility]-MO GYN ONC [facility] None  06/07/2018 10:30 AM [facility]-PORT FLUSH [facility] None  07/10/2018  1:45 PM Vevelyn Francois, NP [facility] None  07/26/2018 10:30 AM [facility]-PORT FLUSH [facility] None  09/13/2018 10:30 AM [facility]-PORT FLUSH [facility] None   Primary Care Physician: Patient, No Pcp Per Location: [facility] Note by: Gaspar Cola, MD Date: 04/09/2018; Time: 4:56 PM

## 2018-04-09 ENCOUNTER — Other Ambulatory Visit: Payer: Self-pay

## 2018-04-09 ENCOUNTER — Ambulatory Visit
Admission: RE | Admit: 2018-04-09 | Discharge: 2018-04-09 | Disposition: A | Discharge status: Home / Self Care | Payer: 59 | Source: Ambulatory Visit | Attending: Radiation Oncology | Admitting: Radiation Oncology

## 2018-04-09 ENCOUNTER — Encounter: Payer: Self-pay | Admitting: Pain Medicine

## 2018-04-09 ENCOUNTER — Ambulatory Visit: Payer: 59 | Attending: Pain Medicine | Admitting: Pain Medicine

## 2018-04-09 ENCOUNTER — Ambulatory Visit: Payer: No Typology Code available for payment source

## 2018-04-09 VITALS — BP 105/81 | HR 84 | Temp 97.6°F | Resp 16 | Ht 63.0 in | Wt 178.0 lb

## 2018-04-09 DIAGNOSIS — M79671 Pain in right foot: Secondary | ICD-10-CM | POA: Diagnosis not present

## 2018-04-09 DIAGNOSIS — G894 Chronic pain syndrome: Secondary | ICD-10-CM

## 2018-04-09 DIAGNOSIS — M47816 Spondylosis without myelopathy or radiculopathy, lumbar region: Secondary | ICD-10-CM

## 2018-04-09 DIAGNOSIS — M25551 Pain in right hip: Secondary | ICD-10-CM | POA: Diagnosis present

## 2018-04-09 DIAGNOSIS — G8929 Other chronic pain: Secondary | ICD-10-CM | POA: Diagnosis present

## 2018-04-09 DIAGNOSIS — G620 Drug-induced polyneuropathy: Secondary | ICD-10-CM | POA: Diagnosis not present

## 2018-04-09 DIAGNOSIS — M792 Neuralgia and neuritis, unspecified: Secondary | ICD-10-CM

## 2018-04-09 DIAGNOSIS — M79642 Pain in left hand: Secondary | ICD-10-CM | POA: Diagnosis present

## 2018-04-09 DIAGNOSIS — M25561 Pain in right knee: Secondary | ICD-10-CM

## 2018-04-09 DIAGNOSIS — G893 Neoplasm related pain (acute) (chronic): Secondary | ICD-10-CM | POA: Diagnosis present

## 2018-04-09 DIAGNOSIS — M545 Low back pain: Secondary | ICD-10-CM

## 2018-04-09 DIAGNOSIS — Z510 Encounter for antineoplastic radiation therapy: Secondary | ICD-10-CM | POA: Insufficient documentation

## 2018-04-09 DIAGNOSIS — M25552 Pain in left hip: Secondary | ICD-10-CM

## 2018-04-09 DIAGNOSIS — M25562 Pain in left knee: Secondary | ICD-10-CM | POA: Diagnosis present

## 2018-04-09 DIAGNOSIS — T451X5A Adverse effect of antineoplastic and immunosuppressive drugs, initial encounter: Secondary | ICD-10-CM | POA: Diagnosis present

## 2018-04-09 DIAGNOSIS — G5793 Unspecified mononeuropathy of bilateral lower limbs: Secondary | ICD-10-CM | POA: Diagnosis present

## 2018-04-09 DIAGNOSIS — M79672 Pain in left foot: Secondary | ICD-10-CM | POA: Diagnosis present

## 2018-04-09 DIAGNOSIS — M533 Sacrococcygeal disorders, not elsewhere classified: Secondary | ICD-10-CM | POA: Diagnosis not present

## 2018-04-09 DIAGNOSIS — M79641 Pain in right hand: Secondary | ICD-10-CM

## 2018-04-09 DIAGNOSIS — Z170 Estrogen receptor positive status [ER+]: Secondary | ICD-10-CM | POA: Diagnosis not present

## 2018-04-09 DIAGNOSIS — C50512 Malignant neoplasm of lower-outer quadrant of left female breast: Secondary | ICD-10-CM | POA: Diagnosis not present

## 2018-04-09 MED ORDER — OXYCODONE HCL 5 MG PO TABS: 5.0000 mg | ORAL_TABLET | ORAL | 0 refills | Status: DC | PRN

## 2018-04-09 MED ORDER — OXYCODONE HCL 5 MG PO TABS: 5.0000 mg | ORAL_TABLET | Freq: Four times a day (QID) | ORAL | 0 refills | Status: DC | PRN

## 2018-04-09 MED ORDER — GABAPENTIN 300 MG PO CAPS: 900.0000 mg | ORAL_CAPSULE | Freq: Four times a day (QID) | ORAL | 2 refills | Status: DC

## 2018-04-09 NOTE — Progress Notes (Signed)
Nursing Pain Medication Assessment:  Safety precautions to be maintained throughout the outpatient stay will include: orient to surroundings, keep bed in low position, maintain call bell within reach at all times, provide assistance with transfer out of bed and ambulation.  Medication Inspection Compliance: Pill count conducted under aseptic conditions, in front of the patient. Neither the pills nor the bottle was removed from the patient's sight at any time. Once count was completed pills were immediately returned to the patient in their original bottle.  Medication: Oxycodone IR Pill/Patch Count: 0 of 60 pills remain Pill/Patch Appearance: Markings consistent with prescribed medication Bottle Appearance: Standard pharmacy container. Clearly labeled. Filled Date: 01/08 / 2020 Last Medication intake:  Today

## 2018-04-09 NOTE — Addendum Note (Signed)
Addended by: Milinda Pointer A on: 04/09/2018 04:56 PM   Modules accepted: Orders

## 2018-04-09 NOTE — Patient Instructions (Signed)
____________________________________________________________________________________________  Medication Rules  Purpose: To inform patients, and their family members, of our rules and regulations.  Applies to: All patients receiving prescriptions (written or electronic).  Pharmacy of record: Pharmacy where electronic prescriptions will be sent. If written prescriptions are taken to a different pharmacy, please inform the nursing staff. The pharmacy listed in the electronic medical record should be the one where you would like electronic prescriptions to be sent.  Electronic prescriptions: In compliance with the [place] Strengthen Opioid Misuse Prevention (STOP) Act of 2017 (Session Law 2017-74/H243), effective February 07, 2018, all controlled substances must be electronically prescribed. Calling prescriptions to the pharmacy will cease to exist.  Prescription refills: Only during scheduled appointments. Applies to all prescriptions.  NOTE: The following applies primarily to controlled substances (Opioid* Pain Medications).   Patient's responsibilities: 1. Pain Pills: Bring all pain pills to every appointment (except for procedure appointments). 2. Pill Bottles: Bring pills in original pharmacy bottle. Always bring the newest bottle. Bring bottle, even if empty. 3. Medication refills: You are responsible for knowing and keeping track of what medications you take and those you need refilled. The day before your appointment: write a list of all prescriptions that need to be refilled. The day of the appointment: give the list to the admitting nurse. Prescriptions will be written only during appointments. No prescriptions will be written on procedure days. If you forget a medication: it will not be "Called in", "Faxed", or "electronically sent". You will need to get another appointment to get these prescribed. No early refills. Do not call asking to have your prescription filled  early. 4. Prescription Accuracy: You are responsible for carefully inspecting your prescriptions before leaving our office. Have the discharge nurse carefully go over each prescription with you, before taking them home. Make sure that your name is accurately spelled, that your address is correct. Check the name and dose of your medication to make sure it is accurate. Check the number of pills, and the written instructions to make sure they are clear and accurate. Make sure that you are given enough medication to last until your next medication refill appointment. 5. Taking Medication: Take medication as prescribed. When it comes to controlled substances, taking less pills or less frequently than prescribed is permitted and encouraged. Never take more pills than instructed. Never take medication more frequently than prescribed.  6. Inform other Doctors: Always inform, all of your healthcare providers, of all the medications you take. 7. Pain Medication from other Providers: You are not allowed to accept any additional pain medication from any other Doctor or Healthcare provider. There are two exceptions to this rule. (see below) In the event that you require additional pain medication, you are responsible for notifying us, as stated below. 8. Medication Agreement: You are responsible for carefully reading and following our Medication Agreement. This must be signed before receiving any prescriptions from our practice. Safely store a copy of your signed Agreement. Violations to the Agreement will result in no further prescriptions. (Additional copies of our Medication Agreement are available upon request.) 9. Laws, Rules, & Regulations: All patients are expected to follow all Federal and State Laws, Statutes, Rules, & Regulations. Ignorance of the Laws does not constitute a valid excuse. The use of any illegal substances is prohibited. 10. Adopted CDC guidelines & recommendations: Target dosing levels will be  at or below 60 MME/day. Use of benzodiazepines** is not recommended.  Exceptions: There are only two exceptions to the rule of not   receiving pain medications from other Healthcare Providers. 1. Exception #1 (Emergencies): In the event of an emergency (i.e.: accident requiring emergency care), you are allowed to receive additional pain medication. However, you are responsible for: As soon as you are able, call our office [phone], at any time of the day or night, and leave a message stating your name, the date and nature of the emergency, and the name and dose of the medication prescribed. In the event that your call is answered by a member of our staff, make sure to document and save the date, time, and the name of the person that took your information.  2. Exception #2 (Planned Surgery): In the event that you are scheduled by another doctor or dentist to have any type of surgery or procedure, you are allowed (for a period no longer than 30 days), to receive additional pain medication, for the acute post-op pain. However, in this case, you are responsible for picking up a copy of our "Post-op Pain Management for Surgeons" handout, and giving it to your surgeon or dentist. This document is available at our office, and does not require an appointment to obtain it. Simply go to our office during business hours (Monday-Thursday from 8:00 AM to 4:00 PM) (Friday 8:00 AM to 12:00 Noon) or if you have a scheduled appointment with us, prior to your surgery, and ask for it by name. In addition, you will need to provide us with your name, name of your surgeon, type of surgery, and date of procedure or surgery.  *Opioid medications include: morphine, codeine, oxycodone, oxymorphone, hydrocodone, hydromorphone, meperidine, tramadol, tapentadol, buprenorphine, fentanyl, methadone. **Benzodiazepine medications include: diazepam (Valium), alprazolam (Xanax), clonazepam (Klonopine), lorazepam (Ativan), clorazepate  (Tranxene), chlordiazepoxide (Librium), estazolam (Prosom), oxazepam (Serax), temazepam (Restoril), triazolam (Halcion) (Last updated: 04/06/2017) ____________________________________________________________________________________________   ____________________________________________________________________________________________  Medication Recommendations and Reminders  Applies to: All patients receiving prescriptions (written and/or electronic).  Medication Rules & Regulations: These rules and regulations exist for your safety and that of others. They are not flexible and neither are we. Dismissing or ignoring them will be considered "non-compliance" with medication therapy, resulting in complete and irreversible termination of such therapy. (See document titled "Medication Rules" for more details.) In all conscience, because of safety reasons, we cannot continue providing a therapy where the patient does not follow instructions.  Pharmacy of record:   Definition: This is the pharmacy where your electronic prescriptions will be sent.   We do not endorse any particular pharmacy.  You are not restricted in your choice of pharmacy.  The pharmacy listed in the electronic medical record should be the one where you want electronic prescriptions to be sent.  If you choose to change pharmacy, simply notify our nursing staff of your choice of new pharmacy.  Recommendations:  Keep all of your pain medications in a safe place, under lock and key, even if you live alone.   After you fill your prescription, take 1 week's worth of pills and put them away in a safe place. You should keep a separate, properly labeled bottle for this purpose. The remainder should be kept in the original bottle. Use this as your primary supply, until it runs out. Once it's gone, then you know that you have 1 week's worth of medicine, and it is time to come in for a prescription refill. If you do this correctly, it  is unlikely that you will ever run out of medicine.  To make sure that the above recommendation works,   it is very important that you make sure your medication refill appointments are scheduled at least 1 week before you run out of medicine. To do this in an effective manner, make sure that you do not leave the office without scheduling your next medication management appointment. Always ask the nursing staff to show you in your prescription , when your medication will be running out. Then arrange for the receptionist to get you a return appointment, at least 7 days before you run out of medicine. Do not wait until you have 1 or 2 pills left, to come in. This is very poor planning and does not take into consideration that we may need to cancel appointments due to bad weather, sickness, or emergencies affecting our staff.  "Partial Fill": If for any reason your pharmacy does not have enough pills/tablets to completely fill or refill your prescription, do not allow for a "partial fill". You will need a separate prescription to fill the remaining amount, which we will not provide. If the reason for the partial fill is your insurance, you will need to talk to the pharmacist about payment alternatives for the remaining tablets, but again, do not accept a partial fill.  Prescription refills and/or changes in medication(s):   Prescription refills, and/or changes in dose or medication, will be conducted only during scheduled medication management appointments. (Applies to both, written and electronic prescriptions.)  No refills on procedure days. No medication will be changed or started on procedure days. No changes, adjustments, and/or refills will be conducted on a procedure day. Doing so will interfere with the diagnostic portion of the procedure.  No phone refills. No medications will be "called into the pharmacy".  No Fax refills.  No weekend refills.  No [place] refills.  No after hours  refills.  Remember:  Business hours are:  Monday to Thursday 8:00 AM to 4:00 PM Provider's Schedule: Crystal King, NP - Appointments are:  Medication management: Monday to Thursday 8:00 AM to 4:00 PM Tirrell Buchberger, MD - Appointments are:  Medication management: Monday and Wednesday 8:00 AM to 4:00 PM Procedure day: Tuesday and Thursday 7:30 AM to 4:00 PM Bilal Lateef, MD - Appointments are:  Medication management: Tuesday and Thursday 8:00 AM to 4:00 PM Procedure day: Monday and Wednesday 7:30 AM to 4:00 PM (Last update: 04/06/2017) ____________________________________________________________________________________________   ____________________________________________________________________________________________  CANNABIDIOL (AKA: CBD Oil or Pills)  Applies to: All patients receiving prescriptions of controlled substances (written and/or electronic).  General Information: Cannabidiol (CBD) was discovered in 1940. It is one of some 113 identified cannabinoids in cannabis (Marijuana) plants, accounting for up to 40% of the plant's extract. As of 2018, preliminary clinical research on cannabidiol included studies of anxiety, cognition, movement disorders, and pain.  Cannabidiol is consummed in multiple ways, including inhalation of cannabis smoke or vapor, as an aerosol spray into the cheek, and by mouth. It may be supplied as CBD oil containing CBD as the active ingredient (no added tetrahydrocannabinol (THC) or terpenes), a full-plant CBD-dominant hemp extract oil, capsules, dried cannabis, or as a liquid solution. CBD is thought not have the same psychoactivity as THC, and may affect the actions of THC. Studies suggest that CBD may interact with different biological targets, including cannabinoid receptors and other neurotransmitter receptors. As of 2018 the mechanism of action for its biological effects has not been determined.  In the [place], cannabidiol has a limited  approval by the Food and Drug Administration (FDA) for treatment of only two types   of epilepsy disorders. The side effects of long-term use of the drug include somnolence, decreased appetite, diarrhea, fatigue, malaise, weakness, sleeping problems, and others.  CBD remains a Schedule I drug prohibited for any use.  Legality: Some manufacturers ship CBD products nationally, an illegal action which the FDA has not enforced in 2018, with CBD remaining the subject of an FDA investigational new drug evaluation, and is not considered legal as a dietary supplement or food ingredient as of December 2018. Federal illegality has made it difficult historically to conduct research on CBD. CBD is openly sold in head shops and health food stores in some states where such sales have not been explicitly legalized.  Warning: Because it is not FDA approved for general use or treatment of pain, it is not required to undergo the same manufacturing controls as prescription drugs.  This means that the available cannabidiol (CBD) may be contaminated with THC.  If this is the case, it will trigger a positive urine drug screen (UDS) test for cannabinoids (Marijuana).  Because a positive UDS for illicit substances is a violation of our medication agreement, your opioid analgesics (pain medicine) may be permanently discontinued. (Last update: 04/27/2017) ____________________________________________________________________________________________    

## 2018-04-10 ENCOUNTER — Ambulatory Visit: Payer: No Typology Code available for payment source

## 2018-04-10 ENCOUNTER — Ambulatory Visit
Admission: RE | Admit: 2018-04-10 | Discharge: 2018-04-10 | Disposition: A | Discharge status: Home / Self Care | Payer: 59 | Source: Ambulatory Visit | Attending: Radiation Oncology | Admitting: Radiation Oncology

## 2018-04-10 DIAGNOSIS — Z510 Encounter for antineoplastic radiation therapy: Secondary | ICD-10-CM | POA: Diagnosis not present

## 2018-04-11 ENCOUNTER — Ambulatory Visit: Payer: No Typology Code available for payment source

## 2018-04-11 ENCOUNTER — Ambulatory Visit
Admission: RE | Admit: 2018-04-11 | Discharge: 2018-04-11 | Disposition: A | Discharge status: Home / Self Care | Payer: 59 | Source: Ambulatory Visit | Attending: Radiation Oncology | Admitting: Radiation Oncology

## 2018-04-11 DIAGNOSIS — Z510 Encounter for antineoplastic radiation therapy: Secondary | ICD-10-CM | POA: Diagnosis not present

## 2018-04-12 ENCOUNTER — Ambulatory Visit
Admission: RE | Admit: 2018-04-12 | Discharge: 2018-04-12 | Disposition: A | Discharge status: Home / Self Care | Payer: 59 | Source: Ambulatory Visit | Attending: Radiation Oncology | Admitting: Radiation Oncology

## 2018-04-12 ENCOUNTER — Ambulatory Visit: Payer: No Typology Code available for payment source

## 2018-04-12 DIAGNOSIS — Z510 Encounter for antineoplastic radiation therapy: Secondary | ICD-10-CM | POA: Diagnosis not present

## 2018-04-13 ENCOUNTER — Ambulatory Visit: Payer: No Typology Code available for payment source

## 2018-04-13 ENCOUNTER — Ambulatory Visit
Admission: RE | Admit: 2018-04-13 | Discharge: 2018-04-13 | Disposition: A | Discharge status: Home / Self Care | Payer: 59 | Source: Ambulatory Visit | Attending: Radiation Oncology | Admitting: Radiation Oncology

## 2018-04-13 ENCOUNTER — Telehealth: Payer: Self-pay | Admitting: *Deleted

## 2018-04-13 DIAGNOSIS — Z510 Encounter for antineoplastic radiation therapy: Secondary | ICD-10-CM | POA: Diagnosis not present

## 2018-04-16 ENCOUNTER — Ambulatory Visit: Payer: No Typology Code available for payment source

## 2018-04-16 ENCOUNTER — Ambulatory Visit
Admission: RE | Admit: 2018-04-16 | Discharge: 2018-04-16 | Disposition: A | Discharge status: Home / Self Care | Payer: 59 | Source: Ambulatory Visit | Attending: Radiation Oncology | Admitting: Radiation Oncology

## 2018-04-16 DIAGNOSIS — Z510 Encounter for antineoplastic radiation therapy: Secondary | ICD-10-CM | POA: Diagnosis not present

## 2018-04-17 ENCOUNTER — Ambulatory Visit
Admission: RE | Admit: 2018-04-17 | Discharge: 2018-04-17 | Disposition: A | Discharge status: Home / Self Care | Payer: 59 | Source: Ambulatory Visit | Attending: Radiation Oncology | Admitting: Radiation Oncology

## 2018-04-17 ENCOUNTER — Ambulatory Visit: Payer: No Typology Code available for payment source

## 2018-04-17 DIAGNOSIS — Z510 Encounter for antineoplastic radiation therapy: Secondary | ICD-10-CM | POA: Diagnosis not present

## 2018-04-18 ENCOUNTER — Ambulatory Visit: Payer: No Typology Code available for payment source

## 2018-04-18 ENCOUNTER — Ambulatory Visit
Admission: RE | Admit: 2018-04-18 | Discharge: 2018-04-18 | Disposition: A | Discharge status: Home / Self Care | Payer: 59 | Source: Ambulatory Visit | Attending: Radiation Oncology | Admitting: Radiation Oncology

## 2018-04-18 DIAGNOSIS — Z510 Encounter for antineoplastic radiation therapy: Secondary | ICD-10-CM | POA: Diagnosis not present

## 2018-04-19 ENCOUNTER — Ambulatory Visit
Admission: RE | Admit: 2018-04-19 | Discharge: 2018-04-19 | Disposition: A | Discharge status: Home / Self Care | Payer: 59 | Source: Ambulatory Visit | Attending: Radiation Oncology | Admitting: Radiation Oncology

## 2018-04-19 ENCOUNTER — Inpatient Hospital Stay: Payer: 59 | Attending: Radiation Oncology

## 2018-04-19 ENCOUNTER — Other Ambulatory Visit: Payer: Self-pay

## 2018-04-19 ENCOUNTER — Ambulatory Visit: Payer: No Typology Code available for payment source

## 2018-04-19 DIAGNOSIS — Z170 Estrogen receptor positive status [ER+]: Secondary | ICD-10-CM | POA: Insufficient documentation

## 2018-04-19 DIAGNOSIS — Z95828 Presence of other vascular implants and grafts: Secondary | ICD-10-CM

## 2018-04-19 DIAGNOSIS — Z452 Encounter for adjustment and management of vascular access device: Secondary | ICD-10-CM | POA: Diagnosis not present

## 2018-04-19 DIAGNOSIS — C50512 Malignant neoplasm of lower-outer quadrant of left female breast: Secondary | ICD-10-CM | POA: Diagnosis present

## 2018-04-19 DIAGNOSIS — Z510 Encounter for antineoplastic radiation therapy: Secondary | ICD-10-CM | POA: Diagnosis not present

## 2018-04-19 LAB — CBC
HCT: 43.7 % (ref 36.0–46.0)
Hemoglobin: 14.7 g/dL (ref 12.0–15.0)
MCH: 30.3 pg (ref 26.0–34.0)
MCHC: 33.6 g/dL (ref 30.0–36.0)
MCV: 90.1 fL (ref 80.0–100.0)
Platelets: 176 10*3/uL (ref 150–400)
RBC: 4.85 MIL/uL (ref 3.87–5.11)
RDW: 14.5 % (ref 11.5–15.5)
WBC: 6.2 10*3/uL (ref 4.0–10.5)
nRBC: 0 % (ref 0.0–0.2)

## 2018-04-19 MED ORDER — HEPARIN SOD (PORK) LOCK FLUSH 100 UNIT/ML IV SOLN
500.0000 [IU] | Freq: Once | INTRAVENOUS | Status: AC
  Administered 2018-04-19: 500 [IU] via INTRAVENOUS

## 2018-04-19 MED ORDER — SODIUM CHLORIDE 0.9% FLUSH
10.0000 mL | Freq: Once | INTRAVENOUS | Status: AC
  Administered 2018-04-19: 10 mL via INTRAVENOUS
  Filled 2018-04-19: qty 10

## 2018-04-20 ENCOUNTER — Ambulatory Visit
Admission: RE | Admit: 2018-04-20 | Discharge: 2018-04-20 | Disposition: A | Discharge status: Home / Self Care | Payer: 59 | Source: Ambulatory Visit | Attending: Radiation Oncology | Admitting: Radiation Oncology

## 2018-04-20 ENCOUNTER — Other Ambulatory Visit: Payer: Self-pay

## 2018-04-20 ENCOUNTER — Ambulatory Visit
Admission: RE | Admit: 2018-04-20 | Discharge: 2018-04-20 | Disposition: A | Discharge status: Home / Self Care | Payer: 59 | Source: Ambulatory Visit | Attending: Pain Medicine | Admitting: Pain Medicine

## 2018-04-20 ENCOUNTER — Ambulatory Visit: Payer: No Typology Code available for payment source

## 2018-04-20 DIAGNOSIS — M545 Low back pain, unspecified: Secondary | ICD-10-CM

## 2018-04-20 DIAGNOSIS — M533 Sacrococcygeal disorders, not elsewhere classified: Secondary | ICD-10-CM | POA: Insufficient documentation

## 2018-04-20 DIAGNOSIS — G8929 Other chronic pain: Secondary | ICD-10-CM

## 2018-04-20 DIAGNOSIS — G893 Neoplasm related pain (acute) (chronic): Secondary | ICD-10-CM

## 2018-04-20 DIAGNOSIS — M25551 Pain in right hip: Secondary | ICD-10-CM | POA: Diagnosis present

## 2018-04-20 DIAGNOSIS — M47816 Spondylosis without myelopathy or radiculopathy, lumbar region: Secondary | ICD-10-CM | POA: Diagnosis not present

## 2018-04-20 DIAGNOSIS — Z510 Encounter for antineoplastic radiation therapy: Secondary | ICD-10-CM | POA: Diagnosis not present

## 2018-04-20 DIAGNOSIS — M25552 Pain in left hip: Secondary | ICD-10-CM | POA: Insufficient documentation

## 2018-04-23 ENCOUNTER — Other Ambulatory Visit: Payer: Self-pay

## 2018-04-23 ENCOUNTER — Ambulatory Visit
Admission: RE | Admit: 2018-04-23 | Discharge: 2018-04-23 | Disposition: A | Discharge status: Home / Self Care | Payer: 59 | Source: Ambulatory Visit | Attending: Radiation Oncology | Admitting: Radiation Oncology

## 2018-04-23 ENCOUNTER — Ambulatory Visit: Payer: No Typology Code available for payment source

## 2018-04-23 DIAGNOSIS — Z510 Encounter for antineoplastic radiation therapy: Secondary | ICD-10-CM | POA: Diagnosis not present

## 2018-04-24 ENCOUNTER — Ambulatory Visit: Payer: No Typology Code available for payment source

## 2018-04-24 ENCOUNTER — Ambulatory Visit
Admission: RE | Admit: 2018-04-24 | Discharge: 2018-04-24 | Disposition: A | Discharge status: Home / Self Care | Payer: 59 | Source: Ambulatory Visit | Attending: Radiation Oncology | Admitting: Radiation Oncology

## 2018-04-24 ENCOUNTER — Other Ambulatory Visit: Payer: Self-pay

## 2018-04-24 DIAGNOSIS — Z510 Encounter for antineoplastic radiation therapy: Secondary | ICD-10-CM | POA: Diagnosis not present

## 2018-04-25 ENCOUNTER — Ambulatory Visit
Admission: RE | Admit: 2018-04-25 | Discharge: 2018-04-25 | Disposition: A | Discharge status: Home / Self Care | Payer: 59 | Source: Ambulatory Visit | Attending: Radiation Oncology | Admitting: Radiation Oncology

## 2018-04-25 ENCOUNTER — Other Ambulatory Visit: Payer: Self-pay

## 2018-04-25 ENCOUNTER — Ambulatory Visit: Payer: No Typology Code available for payment source

## 2018-04-25 DIAGNOSIS — Z510 Encounter for antineoplastic radiation therapy: Secondary | ICD-10-CM | POA: Diagnosis not present

## 2018-04-26 ENCOUNTER — Ambulatory Visit
Admission: RE | Admit: 2018-04-26 | Discharge: 2018-04-26 | Disposition: A | Discharge status: Home / Self Care | Payer: 59 | Source: Ambulatory Visit | Attending: Radiation Oncology | Admitting: Radiation Oncology

## 2018-04-26 ENCOUNTER — Other Ambulatory Visit: Payer: Self-pay

## 2018-04-26 ENCOUNTER — Ambulatory Visit: Payer: No Typology Code available for payment source

## 2018-04-26 DIAGNOSIS — Z510 Encounter for antineoplastic radiation therapy: Secondary | ICD-10-CM | POA: Diagnosis not present

## 2018-04-27 ENCOUNTER — Ambulatory Visit
Admission: RE | Admit: 2018-04-27 | Discharge: 2018-04-27 | Disposition: A | Discharge status: Home / Self Care | Payer: 59 | Source: Ambulatory Visit | Attending: Radiation Oncology | Admitting: Radiation Oncology

## 2018-04-27 ENCOUNTER — Ambulatory Visit: Payer: No Typology Code available for payment source

## 2018-04-27 DIAGNOSIS — Z510 Encounter for antineoplastic radiation therapy: Secondary | ICD-10-CM | POA: Diagnosis not present

## 2018-04-29 ENCOUNTER — Ambulatory Visit: Payer: 59

## 2018-04-30 ENCOUNTER — Ambulatory Visit: Payer: No Typology Code available for payment source

## 2018-04-30 ENCOUNTER — Ambulatory Visit
Admission: RE | Admit: 2018-04-30 | Discharge: 2018-04-30 | Disposition: A | Discharge status: Home / Self Care | Payer: 59 | Source: Ambulatory Visit | Attending: Radiation Oncology | Admitting: Radiation Oncology

## 2018-04-30 ENCOUNTER — Other Ambulatory Visit: Payer: Self-pay

## 2018-04-30 DIAGNOSIS — Z510 Encounter for antineoplastic radiation therapy: Secondary | ICD-10-CM | POA: Diagnosis not present

## 2018-05-01 ENCOUNTER — Ambulatory Visit: Payer: No Typology Code available for payment source

## 2018-05-01 ENCOUNTER — Ambulatory Visit
Admission: RE | Admit: 2018-05-01 | Discharge: 2018-05-01 | Disposition: A | Discharge status: Home / Self Care | Payer: 59 | Source: Ambulatory Visit | Attending: Radiation Oncology | Admitting: Radiation Oncology

## 2018-05-01 DIAGNOSIS — Z510 Encounter for antineoplastic radiation therapy: Secondary | ICD-10-CM | POA: Diagnosis not present

## 2018-05-02 ENCOUNTER — Ambulatory Visit
Admission: RE | Admit: 2018-05-02 | Discharge: 2018-05-02 | Disposition: A | Discharge status: Home / Self Care | Payer: 59 | Source: Ambulatory Visit | Attending: Radiation Oncology | Admitting: Radiation Oncology

## 2018-05-02 ENCOUNTER — Other Ambulatory Visit: Payer: Self-pay

## 2018-05-02 DIAGNOSIS — Z510 Encounter for antineoplastic radiation therapy: Secondary | ICD-10-CM | POA: Diagnosis not present

## 2018-05-03 ENCOUNTER — Ambulatory Visit
Admission: RE | Admit: 2018-05-03 | Discharge: 2018-05-03 | Disposition: A | Discharge status: Home / Self Care | Payer: 59 | Source: Ambulatory Visit | Attending: Radiation Oncology | Admitting: Radiation Oncology

## 2018-05-03 ENCOUNTER — Other Ambulatory Visit: Payer: Self-pay

## 2018-05-03 ENCOUNTER — Inpatient Hospital Stay: Payer: 59

## 2018-05-03 DIAGNOSIS — Z170 Estrogen receptor positive status [ER+]: Principal | ICD-10-CM

## 2018-05-03 DIAGNOSIS — C50512 Malignant neoplasm of lower-outer quadrant of left female breast: Secondary | ICD-10-CM

## 2018-05-03 DIAGNOSIS — Z452 Encounter for adjustment and management of vascular access device: Secondary | ICD-10-CM | POA: Diagnosis not present

## 2018-05-03 DIAGNOSIS — Z510 Encounter for antineoplastic radiation therapy: Secondary | ICD-10-CM | POA: Diagnosis not present

## 2018-05-03 LAB — CBC
HCT: 45.8 % (ref 36.0–46.0)
Hemoglobin: 14.9 g/dL (ref 12.0–15.0)
MCH: 30.4 pg (ref 26.0–34.0)
MCHC: 32.5 g/dL (ref 30.0–36.0)
MCV: 93.5 fL (ref 80.0–100.0)
Platelets: 209 10*3/uL (ref 150–400)
RBC: 4.9 MIL/uL (ref 3.87–5.11)
RDW: 14.6 % (ref 11.5–15.5)
WBC: 6.5 10*3/uL (ref 4.0–10.5)
nRBC: 0 % (ref 0.0–0.2)

## 2018-05-04 ENCOUNTER — Ambulatory Visit
Admission: RE | Admit: 2018-05-04 | Discharge: 2018-05-04 | Disposition: A | Discharge status: Home / Self Care | Payer: 59 | Source: Ambulatory Visit | Attending: Radiation Oncology | Admitting: Radiation Oncology

## 2018-05-04 DIAGNOSIS — Z510 Encounter for antineoplastic radiation therapy: Secondary | ICD-10-CM | POA: Diagnosis not present

## 2018-05-05 ENCOUNTER — Ambulatory Visit: Payer: 59

## 2018-05-07 ENCOUNTER — Ambulatory Visit
Admission: RE | Admit: 2018-05-07 | Discharge: 2018-05-07 | Disposition: A | Discharge status: Home / Self Care | Payer: 59 | Source: Ambulatory Visit | Attending: Radiation Oncology | Admitting: Radiation Oncology

## 2018-05-07 DIAGNOSIS — Z510 Encounter for antineoplastic radiation therapy: Secondary | ICD-10-CM | POA: Diagnosis not present

## 2018-05-08 ENCOUNTER — Ambulatory Visit
Admission: RE | Admit: 2018-05-08 | Discharge: 2018-05-08 | Disposition: A | Discharge status: Home / Self Care | Payer: 59 | Source: Ambulatory Visit | Attending: Radiation Oncology | Admitting: Radiation Oncology

## 2018-05-08 DIAGNOSIS — Z510 Encounter for antineoplastic radiation therapy: Secondary | ICD-10-CM | POA: Diagnosis not present

## 2018-05-09 ENCOUNTER — Ambulatory Visit
Admission: RE | Admit: 2018-05-09 | Discharge: 2018-05-09 | Disposition: A | Discharge status: Home / Self Care | Payer: No Typology Code available for payment source | Source: Ambulatory Visit | Attending: Radiation Oncology | Admitting: Radiation Oncology

## 2018-05-09 ENCOUNTER — Ambulatory Visit: Payer: 59 | Attending: Pain Medicine | Admitting: Pain Medicine

## 2018-05-09 ENCOUNTER — Other Ambulatory Visit: Payer: Self-pay

## 2018-05-09 DIAGNOSIS — M25552 Pain in left hip: Secondary | ICD-10-CM

## 2018-05-09 DIAGNOSIS — M25561 Pain in right knee: Secondary | ICD-10-CM

## 2018-05-09 DIAGNOSIS — T451X5A Adverse effect of antineoplastic and immunosuppressive drugs, initial encounter: Secondary | ICD-10-CM

## 2018-05-09 DIAGNOSIS — C50512 Malignant neoplasm of lower-outer quadrant of left female breast: Secondary | ICD-10-CM | POA: Diagnosis not present

## 2018-05-09 DIAGNOSIS — Z170 Estrogen receptor positive status [ER+]: Secondary | ICD-10-CM | POA: Insufficient documentation

## 2018-05-09 DIAGNOSIS — M47816 Spondylosis without myelopathy or radiculopathy, lumbar region: Secondary | ICD-10-CM

## 2018-05-09 DIAGNOSIS — M79671 Pain in right foot: Secondary | ICD-10-CM | POA: Diagnosis not present

## 2018-05-09 DIAGNOSIS — Z510 Encounter for antineoplastic radiation therapy: Secondary | ICD-10-CM | POA: Diagnosis not present

## 2018-05-09 DIAGNOSIS — R937 Abnormal findings on diagnostic imaging of other parts of musculoskeletal system: Secondary | ICD-10-CM

## 2018-05-09 DIAGNOSIS — M25551 Pain in right hip: Secondary | ICD-10-CM

## 2018-05-09 DIAGNOSIS — G620 Drug-induced polyneuropathy: Secondary | ICD-10-CM | POA: Diagnosis not present

## 2018-05-09 DIAGNOSIS — M79641 Pain in right hand: Secondary | ICD-10-CM

## 2018-05-09 DIAGNOSIS — G894 Chronic pain syndrome: Secondary | ICD-10-CM | POA: Diagnosis not present

## 2018-05-09 DIAGNOSIS — M79642 Pain in left hand: Secondary | ICD-10-CM

## 2018-05-09 DIAGNOSIS — M25562 Pain in left knee: Secondary | ICD-10-CM

## 2018-05-09 DIAGNOSIS — G8929 Other chronic pain: Secondary | ICD-10-CM

## 2018-05-09 DIAGNOSIS — M79672 Pain in left foot: Secondary | ICD-10-CM

## 2018-05-09 NOTE — Patient Instructions (Signed)

## 2018-05-09 NOTE — Progress Notes (Signed)
Pain Management Encounter Note - Virtual Visit via Telephone Telehealth (real-time audio visits between healthcare provider and patient).  Patient's Phone No. & Preferred Pharmacy:  [phone] (home); [phone] (mobile); (Preferred) [phone]  [id number] - [place], [place] - [address] [address] Phone: [phone] Fax: [phone]   Pre-screening note:  Our staff contacted Carolyn Roy and offered her an "in person", "face-to-face" appointment versus a telephone encounter. She indicated preferring the telephone encounter, at this time.  Reason for Virtual Visit: COVID-19*  Social distancing based on CDC and AMA recommendations.   I contacted Carolyn Roy on 05/09/2018 at 9:58 AM by telephone and clearly identified myself as Gaspar Cola, MD. I verified that I was speaking with the correct person using two identifiers (Name and date of birth: 1965/07/22).  Advanced Informed Consent I sought verbal advanced consent from Carolyn Roy for telemedicine interactions and virtual visit. I informed Carolyn Roy of the security and privacy concerns, risks, and limitations associated with performing an evaluation and management service by telephone. I also informed Carolyn Roy of the availability of "in person" appointments and I informed her of the possibility of a patient responsible charge related to this service. Carolyn Roy expressed understanding and agreed to proceed.   Historic Elements   Carolyn Roy is a 53 y.o. year old, female patient evaluated today after her last encounter by our practice on 04/13/2018. Carolyn Roy  has a past medical history of Cancer ([facility]) (06/15/2017), Depression, Diabetes mellitus without complication ([place]) (9242), Endometriosis, Family history of breast cancer, Headache, Hyperlipidemia, Ovarian mass, and Pneumonia. She also  has a past surgical history that includes Oophorectomy; Tubal ligation; Portacath placement (Right,  07/14/2017); Axillary lymph node biopsy (Left, 07/14/2017); Breast lumpectomy (Left, 01/12/2018); Breast biopsy (Left); Partial mastectomy with needle localization (Left, 01/12/2018); and Sentinel node biopsy (Left, 01/12/2018). Carolyn Roy has a current medication list which includes the following prescription(s): albuterol, aspirin-acetaminophen-caffeine, fifty50 glucose meter 2.0, calcium carbonate, canagliflozin, vitamin d3, gabapentin, glucose blood, basaglar kwikpen, insulin pen needle, lidocaine-prilocaine, magnesium, ondansetron, oxycodone, oxycodone, oxycodone, and prochlorperazine, and the following Facility-Administered Medications: heparin lock flush and sodium chloride flush. She  reports that she has been smoking cigarettes. She has a 5.50 pack-year smoking history. She has quit using smokeless tobacco.  Her smokeless tobacco use included snuff. She reports that she does not drink alcohol or use drugs. Carolyn Roy is allergic to aspirin.   HPI  I last saw her on 04/09/2018. She is being evaluated for  review of recent testing (MRI).  The patient continues to have pain in the lumbar region which we have determined to be secondary to a lumbar facet syndrome.  Physical exam had previously shown the patient to be having pain on hyperextension and rotation of the lumbar spine and a subsequent diagnostic lumbar facet block provided patient with more than 75% relief of the pain, past the duration of the local anesthetics.  It also showed 100% relief of the pain for the duration of those local anesthetics.  Unfortunately, the insurance company denied my initial request for a second diagnostic lumbar facet block, arguing that there was not enough evidence for it.  The patient now returns after having had an MRI of the lumbar spine that demonstrates not only arthropathy of the lumbar facets bilaterally at the L3-4, L4-5, and L5-S1 levels, but it also shows effusion of those joints, demonstrating that were dealing with an  acute on chronic facet  arthropathy and facet syndrome.  Hopefully, this and the fact that I believe as an expert in pain management with 3 board certifications, that it is medically necessary for this patient to undergo the above-mentioned treatment plan.  Any further denials unessential treatments could be considered as unethical and illegal under part of this insurance company since they are essentially practicing medicine without a license and without personally examining the patient and contradicting diuretic orders from the attending physician.  Pharmacotherapy Assessment  Analgesic: Oxycodone IR 5 mg, 1 to 2 tablets p.o. daily (#60).  The patient should have enough medication until 07/08/2018. MME/day: 15 mg/day.   Monitoring: Pharmacotherapy: No side-effects or adverse reactions reported. [place] PMP: PDMP reviewed during this encounter.       Compliance: No problems identified. Plan: Refer to "POC".  Review of recent tests  MR LUMBAR SPINE WO CONTRAST CLINICAL DATA:  Low back with LEFT buttock and leg pain for 2 years, worse in the past 9 months. History of breast cancer.  EXAM: MRI LUMBAR SPINE WITHOUT CONTRAST  TECHNIQUE: Multiplanar, multisequence MR imaging of the lumbar spine was performed. No intravenous contrast was administered.  COMPARISON:  Plain films 03/14/2018  FINDINGS: Segmentation:  Standard  Alignment:  Trace facet mediated anterolisthesis L3-4 and L4-5.  Vertebrae:  No worrisome osseous lesion.  Conus medullaris and cauda equina: Conus extends to the L1 level. Conus and cauda equina appear normal.  Paraspinal and other soft tissues: Unremarkable  Disc levels:  L1-L2:  Normal  L2-L3:  Normal.  L3-L4: Trace anterolisthesis. Annular bulge. Facet arthropathy with joint effusions, greater on the LEFT. No impingement.  L4-L5: Trace anterolisthesis. Annular bulge. Facet arthropathy and ligamentum flavum hypertrophy. Small joint effusions.  No impingement.  L5-S1:  Normal disc space.  Mild facet arthropathy.  No impingement.  IMPRESSION: Degenerative trace anterolisthesis L3-4 with annular bulge. Facet arthropathy with joint effusions, greater on the LEFT. No impingement.  Degenerative trace anterolisthesis L4-5 with annular bulge. Facet arthropathy with joint effusions, but no impingement.  No worrisome osseous lesion.  Electronically Signed   By: Staci Righter M.D.   On: 04/20/2018 09:43   Orders Only on 05/03/2018  Component Date Value Ref Range Status  . WBC 05/03/2018 6.5  4.0 - 10.5 K/uL Final  . RBC 05/03/2018 4.90  3.87 - 5.11 MIL/uL Final  . Hemoglobin 05/03/2018 14.9  12.0 - 15.0 g/dL Final  . HCT 05/03/2018 45.8  36.0 - 46.0 % Final  . MCV 05/03/2018 93.5  80.0 - 100.0 fL Final  . MCH 05/03/2018 30.4  26.0 - 34.0 pg Final  . MCHC 05/03/2018 32.5  30.0 - 36.0 g/dL Final  . RDW 05/03/2018 14.6  11.5 - 15.5 % Final  . Platelets 05/03/2018 209  150 - 400 K/uL Final  . nRBC 05/03/2018 0.0  0.0 - 0.2 % Final   Performed at [facility], [address] [place], [place] [address]   Assessment  The primary encounter diagnosis was Chronic pain syndrome. Diagnoses of Chronic feet pain (Primary Area of Pain) (Bilateral) (R>L), Chemotherapy-induced neuropathy (HCC), Chronic knee pain (Secondary Area of Pain) (Bilateral) (R>L), Chronic hand pain (Tertiary Area of Pain) (Bilateral) (R>L), Chronic hip pain (Fourth Area of Pain) (Bilateral) (R>L), Abnormal MRI, lumbar spine (04/20/2018), Lumbar facet arthropathy, and Lumbar facet syndrome (Bilateral) (L>R) were also pertinent to this visit.  Plan of Care  I discussed the assessment and treatment plan with the patient. The patient was provided an opportunity to ask questions and  all were answered. The patient agreed with the plan and demonstrated an understanding of the instructions.  Patient advised to call back or seek an in-person evaluation if the symptoms or  condition worsens.  I am having Carolyn Roy maintain her glucose blood, Insulin Pen Needle, albuterol, Basaglar KwikPen, ondansetron, prochlorperazine, lidocaine-prilocaine, canagliflozin, aspirin-acetaminophen-caffeine, Fifty50 Glucose Meter 2.0, Vitamin D3, Magnesium, calcium carbonate, gabapentin, oxyCODONE, oxyCODONE, and oxyCODONE.   Pharmacotherapy (Medications Ordered): No orders of the defined types were placed in this encounter.  Orders:  Orders Placed This Encounter  Procedures  . LUMBAR FACET(MEDIAL BRANCH NERVE BLOCK) MBNB    Standing Status:   Standing    Number of Occurrences:   5    Standing Expiration Date:   05/09/2019    Scheduling Instructions:     Purpose: Diagnostic     Indication: Axial low back pain. Lumbosacral Spondylosis (M47.897).      Side: Bilateral     Level: L3-4, L4-5, & L5-S1 Facets (L2, L3, L4, L5, & S1 Medial Branch Nerves)     Sedation: With Sedation.     TIMEFRAME: PRN procedure. (Carolyn Roy will call when needed.)    Order Specific Question:   Where will this procedure be performed?    Answer:   [facility]   Follow-up plan:   Return for PRN Procedure(s): (w/ sedation) (B) L-FCT BLK #2.  This should be scheduled as soon as normal elective scheduling is allowed after this COVID-19 pandemic restriction.   Total duration of non-face-to-face encounter: 22 minutes.  Note by: Gaspar Cola, MD Date: 05/09/2018; Time: 9:58 AM  Disclaimer:  * Given the special circumstances of the COVID-19 pandemic, the federal government has announced that the Office for Civil Rights (OCR) will exercise its enforcement discretion and will not impose penalties on physicians using telehealth in the event of noncompliance with regulatory requirements under the [place] and Accountability Act (HIPAA) in connection with the good faith provision of telehealth during the RCBUL-84 national public health emergency. ([place])

## 2018-05-10 ENCOUNTER — Other Ambulatory Visit: Payer: Self-pay

## 2018-05-10 ENCOUNTER — Ambulatory Visit
Admission: RE | Admit: 2018-05-10 | Discharge: 2018-05-10 | Disposition: A | Discharge status: Home / Self Care | Payer: No Typology Code available for payment source | Source: Ambulatory Visit | Attending: Radiation Oncology | Admitting: Radiation Oncology

## 2018-05-10 DIAGNOSIS — Z510 Encounter for antineoplastic radiation therapy: Secondary | ICD-10-CM | POA: Diagnosis not present

## 2018-05-11 ENCOUNTER — Ambulatory Visit
Admission: RE | Admit: 2018-05-11 | Discharge: 2018-05-11 | Disposition: A | Discharge status: Home / Self Care | Payer: No Typology Code available for payment source | Source: Ambulatory Visit | Attending: Radiation Oncology | Admitting: Radiation Oncology

## 2018-05-11 ENCOUNTER — Other Ambulatory Visit: Payer: Self-pay

## 2018-05-11 DIAGNOSIS — Z510 Encounter for antineoplastic radiation therapy: Secondary | ICD-10-CM | POA: Diagnosis not present

## 2018-05-14 ENCOUNTER — Other Ambulatory Visit: Payer: Self-pay

## 2018-05-14 ENCOUNTER — Ambulatory Visit
Admission: RE | Admit: 2018-05-14 | Discharge: 2018-05-14 | Disposition: A | Discharge status: Home / Self Care | Payer: No Typology Code available for payment source | Source: Ambulatory Visit | Attending: Radiation Oncology | Admitting: Radiation Oncology

## 2018-05-14 DIAGNOSIS — Z510 Encounter for antineoplastic radiation therapy: Secondary | ICD-10-CM | POA: Diagnosis not present

## 2018-05-15 ENCOUNTER — Ambulatory Visit
Admission: RE | Admit: 2018-05-15 | Discharge: 2018-05-15 | Disposition: A | Discharge status: Home / Self Care | Payer: No Typology Code available for payment source | Source: Ambulatory Visit | Attending: Radiation Oncology | Admitting: Radiation Oncology

## 2018-05-15 ENCOUNTER — Other Ambulatory Visit: Payer: Self-pay

## 2018-05-15 DIAGNOSIS — Z510 Encounter for antineoplastic radiation therapy: Secondary | ICD-10-CM | POA: Diagnosis not present

## 2018-05-16 ENCOUNTER — Ambulatory Visit
Admission: RE | Admit: 2018-05-16 | Discharge: 2018-05-16 | Disposition: A | Discharge status: Home / Self Care | Payer: No Typology Code available for payment source | Source: Ambulatory Visit | Attending: Radiation Oncology | Admitting: Radiation Oncology

## 2018-05-16 ENCOUNTER — Other Ambulatory Visit: Payer: Self-pay

## 2018-05-16 DIAGNOSIS — Z510 Encounter for antineoplastic radiation therapy: Secondary | ICD-10-CM | POA: Diagnosis not present

## 2018-05-17 ENCOUNTER — Ambulatory Visit
Admission: RE | Admit: 2018-05-17 | Discharge: 2018-05-17 | Disposition: A | Discharge status: Home / Self Care | Payer: No Typology Code available for payment source | Source: Ambulatory Visit | Attending: Radiation Oncology | Admitting: Radiation Oncology

## 2018-05-17 ENCOUNTER — Other Ambulatory Visit: Payer: Self-pay

## 2018-05-17 ENCOUNTER — Ambulatory Visit: Payer: No Typology Code available for payment source

## 2018-05-17 DIAGNOSIS — Z510 Encounter for antineoplastic radiation therapy: Secondary | ICD-10-CM | POA: Diagnosis not present

## 2018-05-18 ENCOUNTER — Other Ambulatory Visit: Payer: Self-pay

## 2018-05-18 ENCOUNTER — Ambulatory Visit
Admission: RE | Admit: 2018-05-18 | Discharge: 2018-05-18 | Disposition: A | Discharge status: Home / Self Care | Payer: No Typology Code available for payment source | Source: Ambulatory Visit | Attending: Radiation Oncology | Admitting: Radiation Oncology

## 2018-05-18 DIAGNOSIS — Z510 Encounter for antineoplastic radiation therapy: Secondary | ICD-10-CM | POA: Diagnosis not present

## 2018-05-21 ENCOUNTER — Other Ambulatory Visit: Payer: Self-pay

## 2018-05-21 ENCOUNTER — Ambulatory Visit
Admission: RE | Admit: 2018-05-21 | Discharge: 2018-05-21 | Disposition: A | Discharge status: Home / Self Care | Payer: No Typology Code available for payment source | Source: Ambulatory Visit | Attending: Radiation Oncology | Admitting: Radiation Oncology

## 2018-05-21 DIAGNOSIS — Z510 Encounter for antineoplastic radiation therapy: Secondary | ICD-10-CM | POA: Diagnosis not present

## 2018-05-22 ENCOUNTER — Other Ambulatory Visit: Payer: Self-pay

## 2018-05-22 ENCOUNTER — Encounter: Payer: Self-pay | Admitting: Internal Medicine

## 2018-05-22 ENCOUNTER — Ambulatory Visit: Payer: No Typology Code available for payment source

## 2018-05-22 ENCOUNTER — Inpatient Hospital Stay: Payer: No Typology Code available for payment source | Attending: Internal Medicine | Admitting: Internal Medicine

## 2018-05-22 ENCOUNTER — Inpatient Hospital Stay: Payer: No Typology Code available for payment source

## 2018-05-22 DIAGNOSIS — Z8042 Family history of malignant neoplasm of prostate: Secondary | ICD-10-CM | POA: Diagnosis not present

## 2018-05-22 DIAGNOSIS — Z923 Personal history of irradiation: Secondary | ICD-10-CM | POA: Diagnosis not present

## 2018-05-22 DIAGNOSIS — Z79899 Other long term (current) drug therapy: Secondary | ICD-10-CM | POA: Diagnosis not present

## 2018-05-22 DIAGNOSIS — C50512 Malignant neoplasm of lower-outer quadrant of left female breast: Secondary | ICD-10-CM

## 2018-05-22 DIAGNOSIS — Z794 Long term (current) use of insulin: Secondary | ICD-10-CM | POA: Diagnosis not present

## 2018-05-22 DIAGNOSIS — E119 Type 2 diabetes mellitus without complications: Secondary | ICD-10-CM

## 2018-05-22 DIAGNOSIS — Z452 Encounter for adjustment and management of vascular access device: Secondary | ICD-10-CM | POA: Insufficient documentation

## 2018-05-22 DIAGNOSIS — Z7982 Long term (current) use of aspirin: Secondary | ICD-10-CM | POA: Diagnosis not present

## 2018-05-22 DIAGNOSIS — Z803 Family history of malignant neoplasm of breast: Secondary | ICD-10-CM | POA: Diagnosis not present

## 2018-05-22 DIAGNOSIS — Z170 Estrogen receptor positive status [ER+]: Secondary | ICD-10-CM

## 2018-05-22 DIAGNOSIS — G620 Drug-induced polyneuropathy: Secondary | ICD-10-CM

## 2018-05-22 DIAGNOSIS — R5383 Other fatigue: Secondary | ICD-10-CM

## 2018-05-22 DIAGNOSIS — Z79811 Long term (current) use of aromatase inhibitors: Secondary | ICD-10-CM | POA: Diagnosis not present

## 2018-05-22 DIAGNOSIS — F17210 Nicotine dependence, cigarettes, uncomplicated: Secondary | ICD-10-CM | POA: Diagnosis not present

## 2018-05-22 LAB — CBC WITH DIFFERENTIAL/PLATELET
Abs Immature Granulocytes: 0.02 10*3/uL (ref 0.00–0.07)
Basophils Absolute: 0 10*3/uL (ref 0.0–0.1)
Basophils Relative: 1 %
Eosinophils Absolute: 0 10*3/uL (ref 0.0–0.5)
Eosinophils Relative: 1 %
HCT: 45.8 % (ref 36.0–46.0)
Hemoglobin: 14.8 g/dL (ref 12.0–15.0)
Immature Granulocytes: 0 %
Lymphocytes Relative: 9 %
Lymphs Abs: 0.6 10*3/uL — ABNORMAL LOW (ref 0.7–4.0)
MCH: 30.4 pg (ref 26.0–34.0)
MCHC: 32.3 g/dL (ref 30.0–36.0)
MCV: 94 fL (ref 80.0–100.0)
Monocytes Absolute: 0.3 10*3/uL (ref 0.1–1.0)
Monocytes Relative: 5 %
Neutro Abs: 5.2 10*3/uL (ref 1.7–7.7)
Neutrophils Relative %: 84 %
Platelets: 227 10*3/uL (ref 150–400)
RBC: 4.87 MIL/uL (ref 3.87–5.11)
RDW: 14.4 % (ref 11.5–15.5)
WBC: 6.2 10*3/uL (ref 4.0–10.5)
nRBC: 0 % (ref 0.0–0.2)

## 2018-05-22 LAB — COMPREHENSIVE METABOLIC PANEL
ALT: 18 U/L (ref 0–44)
AST: 16 U/L (ref 15–41)
Albumin: 4.2 g/dL (ref 3.5–5.0)
Alkaline Phosphatase: 94 U/L (ref 38–126)
Anion gap: 9 (ref 5–15)
BUN: 12 mg/dL (ref 6–20)
CO2: 26 mmol/L (ref 22–32)
Calcium: 9.5 mg/dL (ref 8.9–10.3)
Chloride: 104 mmol/L (ref 98–111)
Creatinine, Ser: 0.56 mg/dL (ref 0.44–1.00)
GFR calc Af Amer: >60 mL/min (ref >60)
GFR calc non Af Amer: >60 mL/min (ref >60)
Glucose, Bld: 194 mg/dL — ABNORMAL HIGH (ref 70–99)
Potassium: 3.7 mmol/L (ref 3.5–5.1)
Sodium: 139 mmol/L (ref 135–145)
Total Bilirubin: 0.6 mg/dL (ref 0.3–1.2)
Total Protein: 7.7 g/dL (ref 6.5–8.1)

## 2018-05-22 MED ORDER — TAMOXIFEN CITRATE 20 MG PO TABS: 20.0000 mg | ORAL_TABLET | Freq: Every day | ORAL | 4 refills | Status: DC

## 2018-05-22 NOTE — Progress Notes (Signed)
[name]  Patient Care Team: Patient, No Pcp Per as PCP - General (General Practice) [name], [name], MD (General Surgery) [name], [name], [name] (Obstetrics and Gynecology)  CHIEF COMPLAINTS/PURPOSE OF CONSULTATION:  Breast cancer  #  Oncology History   # MAY 2019-  clinical stage IIIA (T3N1Mx) left breast cancer s/p biopsy on 06/14/2017. -Pathology revealed grade III invasive ductal carcinoma. -Axillary FNA revealed malignant cells c/w metastatic carcinoma. Tumor was ER + (90%), PR + (30%), Her2/neu - and Ki67 70%.  CA27.29 was 7.8 on 06/14/2017.  # She received 4 cycles of AC with Neulasta support (07/20/2017 - 08/31/2017).;  neoadjuvant Taxol on 09/14/2017.  #DEC 2019- Lumpectomy/sentinel lymph node biopsy [Dr.[name]]-complete pathologic response  # s/p RT [delayed sec to wound infection; Dr.[name]] finished RT [4/12]  # April 14th 2020- START TAM  # PN-2 sec to taxol Carolyn Roy management/ # may 2019- Endometrial sampling [Dr. Secord/Berchuck]-negative for malignancy/ # DM-2- poorly controlled.   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  -------------------------------------------  DIAGNOSIS: left breast cancer  STAGE:  III       ;GOALS: cure  CURRENT/MOST RECENT THERAPY Tam      Cancer of midline of breast, left ([place])   06/15/2017 Initial Diagnosis    Cancer of midline of breast, left ([place])    06/26/2017 -  Chemotherapy    The patient had DOXOrubicin (ADRIAMYCIN) chemo injection 122 mg, 60 mg/m2 = 122 mg, Intravenous,  Once, 4 of 4 cycles Administration: 122 mg (07/20/2017), 122 mg (08/03/2017), 122 mg (08/17/2017), 122 mg (08/31/2017) palonosetron (ALOXI) injection 0.25 mg, 0.25 mg, Intravenous,  Once, 4 of 4 cycles Administration: 0.25 mg (07/20/2017), 0.25 mg (08/03/2017), 0.25 mg (08/17/2017), 0.25 mg  (08/31/2017) pegfilgrastim (NEULASTA) injection 6 mg, 6 mg, Subcutaneous, Once, 5 of 5 cycles Administration: 6 mg (07/21/2017), 6 mg (08/04/2017), 6 mg (08/18/2017), 6 mg (09/01/2017) cyclophosphamide (CYTOXAN) 1,220 mg in sodium chloride 0.9 % 250 mL chemo infusion, 600 mg/m2 = 1,220 mg, Intravenous,  Once, 4 of 4 cycles Administration: 1,220 mg (07/20/2017), 1,220 mg (08/03/2017), 1,220 mg (08/17/2017), 1,220 mg (08/31/2017) PACLitaxel (TAXOL) 162 mg in sodium chloride 0.9 % 250 mL chemo infusion (</= 79m/m2), 80 mg/m2 = 162 mg, Intravenous,  Once, 10 of 12 cycles Dose modification: 65 mg/m2 (original dose 80 mg/m2, Cycle 13, Reason: Provider Judgment, Comment: neuropathy) Administration: 162 mg (09/14/2017), 162 mg (09/21/2017), 162 mg (09/28/2017), 162 mg (10/05/2017), 162 mg (10/12/2017), 162 mg (10/19/2017), 162 mg (10/26/2017), 132 mg (11/02/2017), 132 mg (11/09/2017), 132 mg (11/16/2017) fosaprepitant (EMEND) 150 mg, dexamethasone (DECADRON) 12 mg in sodium chloride 0.9 % 145 mL IVPB, , Intravenous,  Once, 4 of 4 cycles Administration:  (07/20/2017),  (08/03/2017),  (08/17/2017),  (08/31/2017)  for chemotherapy treatment.      Malignant neoplasm of lower-outer quadrant of left breast of female, estrogen receptor positive ([place]   11/08/2017 Initial Diagnosis    Malignant neoplasm of lower-outer quadrant of left breast of female, estrogen receptor positive ([place]    This is my first interaction with the patient as patient's primary oncologist has [place] reviewed the patient's prior charts/pertinent labs/imaging in detail; findings are summarized above.    HISTORY OF PRESENTING ILLNESS:  Carolyn Roy 53y.o.  female with a history of stage III ER PR positive HER-2/neu negative breast cancer is here for follow-up.  Patient finished radiation to the  breast yesterday.  Patient radiation was delayed because of infections/seroma.  Patient feels improved.  Denies any bone pain or joint pains.  No  nausea no vomiting.  No headaches.  Complains of moderate tingling and numbness in extremities.  Not getting any better.  Review of Systems  Constitutional: Positive for malaise/fatigue. Negative for chills, diaphoresis, fever and weight loss.  HENT: Negative for nosebleeds and sore throat.   Eyes: Negative for double vision.  Respiratory: Negative for cough, hemoptysis, sputum production, shortness of breath and wheezing.   Cardiovascular: Negative for chest pain, palpitations, orthopnea and leg swelling.  Gastrointestinal: Negative for abdominal pain, blood in stool, constipation, diarrhea, heartburn, melena, nausea and vomiting.  Genitourinary: Negative for dysuria, frequency and urgency.  Musculoskeletal: Positive for joint pain. Negative for back pain.  Skin: Negative.  Negative for itching and rash.  Neurological: Positive for tingling. Negative for dizziness, focal weakness, weakness and headaches.  Endo/Heme/Allergies: Does not bruise/bleed easily.  Psychiatric/Behavioral: Negative for depression. The patient is not nervous/anxious and does not have insomnia.      MEDICAL HISTORY:  Past Medical History:  Diagnosis Date  . Cancer ([facility]) 06/15/2017   5.1 cm, T3,N1 (clinical): ER/ PR positive, Her 2 neu not overexpressed, High Ki 67. Neuoadjuvant chemotherapy.   . Depression   . Diabetes mellitus without complication ([place]) 6606  . Endometriosis   . Family history of breast cancer   . Headache    migraines  . Hyperlipidemia   . Ovarian mass   . Pneumonia    2018    SURGICAL HISTORY: Past Surgical History:  Procedure Laterality Date  . AXILLARY LYMPH NODE BIOPSY Left 07/14/2017   Procedure: INSERTION GEL MARK CLIP LEFT AXILLA;  Surgeon: Robert Bellow, MD;  Location: [facility] ORS;  Service: General;  Laterality: Left;  . BREAST BIOPSY Left    Dr Orlene Och BREAST METASTATIC CARCINOMA  . BREAST LUMPECTOMY Left 01/12/2018  . OOPHORECTOMY    . PARTIAL MASTECTOMY WITH  NEEDLE LOCALIZATION Left 01/12/2018   Procedure: PARTIAL MASTECTOMY WITH NEEDLE LOCALIZATION;  Surgeon: Robert Bellow, MD;  Location: [facility] ORS;  Service: General;  Laterality: Left;  . PORTACATH PLACEMENT Right 07/14/2017   Procedure: INSERTION PORT-A-CATH;  Surgeon: Robert Bellow, MD;  Location: [facility] ORS;  Service: General;  Laterality: Right;  . SENTINEL NODE BIOPSY Left 01/12/2018   Procedure: SENTINEL NODE BIOPSY;  Surgeon: Robert Bellow, MD;  Location: [facility] ORS;  Service: General;  Laterality: Left;  . TUBAL LIGATION      SOCIAL HISTORY: Social History   Socioeconomic History  . Marital status: Single    Spouse name: Not on file  . Number of children: Not on file  . Years of education: Not on file  . Highest education level: Not on file  Occupational History  . Not on file  Social Needs  . Financial resource strain: Not on file  . Food insecurity:    Worry: Not on file    Inability: Not on file  . Transportation needs:    Medical: Not on file    Non-medical: Not on file  Tobacco Use  . Smoking status: Current Every Day Smoker    Packs/day: 0.50    Years: 11.00    Pack years: 5.50    Types: Cigarettes  . Smokeless tobacco: Former Systems developer    Types: Snuff  Substance and Sexual Activity  . Alcohol use: No    Alcohol/week: 0.0 standard drinks  . Drug use: No  .  Sexual activity: Yes  Lifestyle  . Physical activity:    Days per week: Not on file    Minutes per session: Not on file  . Stress: Not on file  Relationships  . Social connections:    Talks on phone: Not on file    Gets together: Not on file    Attends religious service: Not on file    Active member of club or organization: Not on file    Attends meetings of clubs or organizations: Not on file    Relationship status: Not on file  . Intimate partner violence:    Fear of current or ex partner: Not on file    Emotionally abused: Not on file    Physically abused: Not on file    Forced sexual  activity: Not on file  Other Topics Concern  . Not on file  Social History Narrative  . Not on file    FAMILY HISTORY: Family History  Problem Relation Age of Onset  . Other Father        No info about father or paternal relatives  . Diabetes Brother   . Pancreatitis Brother   . Prostate cancer Brother 34       currently 52 / maternal half-brother  . Breast cancer Maternal Grandmother 40       deceased 73s  . Breast cancer Maternal Aunt 65       currently 55  . Breast cancer Other 74       mother's sister; deceased 36  . Breast cancer Other        mother's sister; age at dx unknown    ALLERGIES:  is allergic to aspirin.  MEDICATIONS:  Current Outpatient Medications  Medication Sig Dispense Refill  . albuterol (PROVENTIL HFA;VENTOLIN HFA) 108 (90 Base) MCG/ACT inhaler Inhale 2 puffs into the lungs every 6 (six) hours as needed for wheezing or shortness of breath. 1 Inhaler 0  . aspirin-acetaminophen-caffeine (EXCEDRIN MIGRAINE) 250-250-65 MG tablet Take 2 tablets by mouth daily as needed for headache.    . Blood Glucose Monitoring Suppl (FIFTY50 GLUCOSE METER 2.0) w/Device KIT Use as directed    . canagliflozin (INVOKANA) 300 MG TABS tablet Take 300 mg by mouth daily.    . Cholecalciferol (VITAMIN D3) 125 MCG (5000 UT) CAPS Take 1 capsule (5,000 Units total) by mouth daily with breakfast. Take along with calcium and magnesium. 30 capsule 5  . gabapentin (NEURONTIN) 300 MG capsule Take 3 capsules (900 mg total) by mouth 4 (four) times daily. 360 capsule 2  . glucose blood (ONE TOUCH ULTRA TEST) test strip Use up to 4 times/day 100 each 12  . Insulin Glargine (BASAGLAR KWIKPEN) 100 UNIT/ML SOPN INJECT 0.24 MLS (24 UNITS TOTAL) INTO THE SKIN AT BEDTIME. (Patient taking differently: Inject 44 Units into the skin at bedtime. ) 15 pen 2  . Insulin Pen Needle 32G X 4 MM MISC 1 Units by Does not apply route every morning. Pen needles 90 each 3  . lidocaine-prilocaine (EMLA) cream Apply  to affected area once (Patient taking differently: Apply 1 application topically daily as needed (port access). Ap) 30 g 3  . Magnesium 500 MG CAPS Take 1 capsule (500 mg total) by mouth 2 (two) times daily at 8 am and 10 pm. 60 capsule 5  . ondansetron (ZOFRAN) 8 MG tablet Take 1 tablet (8 mg total) by mouth 2 (two) times daily as needed. Start on the third day after chemotherapy. 30 tablet 1  . [  START ON 06/08/2018] oxyCODONE (OXY IR/ROXICODONE) 5 MG immediate release tablet Take 1 tablet (5 mg total) by mouth every 4 (four) hours as needed for up to 30 days for severe pain. 60 tablet 0  . oxyCODONE (OXY IR/ROXICODONE) 5 MG immediate release tablet Take 1-2 tablets (5-10 mg total) by mouth every 6 (six) hours as needed for up to 30 days for severe pain. 60 tablet 0  . prochlorperazine (COMPAZINE) 10 MG tablet Take 1 tablet (10 mg total) by mouth every 6 (six) hours as needed (Nausea or vomiting). 30 tablet 1  . calcium carbonate (CALCIUM 600) 600 MG TABS tablet Take 1 tablet (600 mg total) by mouth 2 (two) times daily with a meal. 60 tablet 0  . oxyCODONE (OXY IR/ROXICODONE) 5 MG immediate release tablet Take 1-2 tablets (5-10 mg total) by mouth every 6 (six) hours as needed for up to 30 days for severe pain. Must last 30 days. 60 tablet 0  . tamoxifen (NOLVADEX) 20 MG tablet Take 1 tablet (20 mg total) by mouth daily. 30 tablet 4   No current facility-administered medications for this visit.    Facility-Administered Medications Ordered in Other Visits  Medication Dose Route Frequency Provider Last Rate Last Dose  . heparin lock flush 100 unit/mL  500 Units Intravenous Once Corcoran, Melissa C, MD      . sodium chloride flush (NS) 0.9 % injection 10 mL  10 mL Intravenous Once Corcoran, Melissa C, MD          .  PHYSICAL EXAMINATION: ECOG PERFORMANCE STATUS: 0 - Asymptomatic  Vitals:   05/22/18 1054  BP: 105/72  Pulse: 82  Resp: 16  Temp: (!) 96.6 F (35.9 C)   Filed Weights    05/22/18 1054  Weight: 180 lb 3.2 oz (81.7 kg)    Physical Exam  Constitutional: She is oriented to person, place, and time and well-developed, well-nourished, and in no distress.  HENT:  Head: Normocephalic and atraumatic.  Mouth/Throat: Oropharynx is clear and moist. No oropharyngeal exudate.  Eyes: Pupils are equal, round, and reactive to light.  Neck: Normal range of motion. Neck supple.  Cardiovascular: Normal rate and regular rhythm.  Pulmonary/Chest: No respiratory distress. She has no wheezes.  Abdominal: Soft. Bowel sounds are normal. She exhibits no distension and no mass. There is no abdominal tenderness. There is no rebound and no guarding.  Musculoskeletal: Normal range of motion.        General: No tenderness or edema.  Neurological: She is alert and oriented to person, place, and time.  Skin: Skin is warm.  Psychiatric: Affect normal.     LABORATORY DATA:  I have reviewed the data as listed Lab Results  Component Value Date   WBC 6.2 05/22/2018   HGB 14.8 05/22/2018   HCT 45.8 05/22/2018   MCV 94.0 05/22/2018   PLT 227 05/22/2018   Recent Labs    11/23/17 1101 01/22/18 1200 02/02/18 0003 05/22/18 1146  NA 138 139 135 139  K 3.8 4.2 3.8 3.7  CL 102 97 102 104  CO2 25  --  25 26  GLUCOSE 226* 307* 337* 194*  BUN _0 CREATININE 0.53 0.51* 0.46 0.56  CALCIUM 9.6 9.7 9.1 9.5  GFRNONAA >60 112 >60 >60  GFRAA >60 130 >60 >60  PROT 6.3* 6.4 7.2 7.7  ALBUMIN 3.8 4.1 3.9 4.2  AST _1 ALT 27  --  14 18  ALKPHOS 78 103  89 94  BILITOT 0.9 0.4 0.5 0.6    RADIOGRAPHIC STUDIES: I have personally reviewed the radiological images as listed and agreed with the findings in the report. No results found.  ASSESSMENT & PLAN:   Malignant neoplasm of lower-outer quadrant of left breast of female, estrogen receptor positive ([place]) # breast cancer Stage III ER/PR pos her 2 NEG. complete pathologic response noted post neoadjuvant chemotherapy.   Currently status post radiation  #Proceed with tamoxifen.  Discussed the potential side effects including but not limited to risk of uterine cancers/hot flashes/weight gain mood swings etc.   #Given the high risk patient would be candidate 10 years of antihormone therapy.  Based upon soft trial-ovarian suppression be reasonable option.  Will discuss.   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  Stable.  # Genetic testing discussed-the patient has no deleterious mutations.  #Endometrial sampling-negative for malignancy.  # DM-2 on insulin improving.   #Adjuvant bisphosphonate-discussed/next.  # DISPOSITION: # labs- CBC/CMP/Ca-27-29/estradiol/ FSH/LH # MD-Virtual visit in 1 month- no labs-Dr.B  # 40 minutes face-to-face with the patient discussing the above plan of care; more than 50% of time spent on prognosis/ natural history; counseling and coordination.     All questions were answered. The patient knows to call the clinic with any problems, questions or concerns.       Carolyn Sickle, MD 05/22/2018 1:18 PM

## 2018-05-22 NOTE — Assessment & Plan Note (Addendum)
#   breast cancer Stage III ER/PR pos her 2 NEG. complete pathologic response noted post neoadjuvant chemotherapy.  Currently status post radiation  #Proceed with tamoxifen.  Discussed the potential side effects including but not limited to risk of uterine cancers/hot flashes/weight gain mood swings etc.   #Given the high risk patient would be candidate 10 years of antihormone therapy.  Based upon soft trial-ovarian suppression be reasonable option.  Will discuss.   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  Stable.  # Genetic testing discussed-the patient has no deleterious mutations.  #Endometrial sampling-negative for malignancy.  # DM-2 on insulin improving.   #Adjuvant bisphosphonate-discussed/next.  # DISPOSITION: # labs- CBC/CMP/Ca-27-29/estradiol/ FSH/LH # MD-Virtual visit in 1 month- no labs-Dr.B  # 40 minutes face-to-face with the patient discussing the above plan of care; more than 50% of time spent on prognosis/ natural history; counseling and coordination.

## 2018-05-23 LAB — CANCER ANTIGEN 27.29: CA 27.29: 12.5 U/mL (ref 0.0–38.6)

## 2018-05-23 LAB — ESTRADIOL: Estradiol: 9.8 pg/mL

## 2018-05-23 LAB — FSH/LH
FSH: 25.5 m[IU]/mL
LH: 12.5 m[IU]/mL

## 2018-05-24 ENCOUNTER — Ambulatory Visit: Payer: 59 | Admitting: General Surgery

## 2018-05-29 ENCOUNTER — Encounter: Payer: Self-pay | Admitting: Nurse Practitioner

## 2018-05-30 ENCOUNTER — Inpatient Hospital Stay (HOSPITAL_BASED_OUTPATIENT_CLINIC_OR_DEPARTMENT_OTHER): Payer: No Typology Code available for payment source | Admitting: Obstetrics and Gynecology

## 2018-05-30 ENCOUNTER — Other Ambulatory Visit: Payer: Self-pay

## 2018-05-30 ENCOUNTER — Telehealth: Payer: Self-pay

## 2018-05-30 DIAGNOSIS — N841 Polyp of cervix uteri: Secondary | ICD-10-CM

## 2018-05-30 NOTE — Progress Notes (Signed)
Gynecologic Oncology Interval Visit  Virtual Visit via Telephone Note  I connected with Carolyn Roy on 05/30/18 at 1:30 PM EST by video enabled telemedicine visit and verified that I am speaking with the correct person using two identifiers.   I discussed the limitations, risks, security and privacy concerns of performing an evaluation and management service by telemedicine and the availability of in-person appointments. I also discussed with the patient that there may be a patient responsible charge related to this service. The patient expressed understanding and agreed to proceed.   Other persons participating in the visit and their role in the encounter: Carolyn Roy, Carolyn Roy (patient), Carolyn Roy, (NP), patient's grandson Carolyn Roy age 30).   Patient's location: home  Provider's location: home office  Chief Complaint: Cervical polyp with focal glandular atypia repeat biopsy endometrial biopsy with chronic endometritis  Patient agreed to evaluation by telephone/telemedicine to discuss follow up for inflamed polyp with focal glandular atypia  Referring Providers: Dr. Rogue Roy (Medical-Oncology) Dr. Hervey Roy  Subjective:  Carolyn Roy is a 53 y.o. female, initially seen in consultation from Dr. Mike Roy for cervical mass on 07/05/2017.  She presents today for follow up.   She was last seen by Dr. Fransisca Roy on 02/28/2018.  On exam, external os was open with visible canal approximately 1 cm.  Cervix is nontender, nonfriable, mobile.  She underwent cervical and endometrial biopsies.  Per note, Dr. Fransisca Roy felt polyp was nearly entirely removed.  DIAGNOSIS:  A. 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  - ACUTELY INFLAMED ENDOCERVICAL MUCOSA WITH REACTIVE CHANGES AND  SQUAMOUS METAPLASIA.  - NEGATIVE FOR ATYPIA / EIN AND MALIGNANCY.  - DEEPER SECTIONS WERE EXAMINED.   DIAGNOSIS:  A.  ENDOMETRIAL BIOPSY:  - SCANT FRAGMENTS OF ATROPHIC ENDOMETRIUM.  - DEEPER SECTIONS EXAMINED.  Comment: Atypia / EIN and malignancy are not identified in this limited sample. Correlation with clinical and radiographic findings is required.    Recommendation was to return to clinic in 3 months.  Of note the patient has stage IIIA lER/PR positive, HER2/neu negative eft breast and has completed 6 cycles of neoadjuvant taxol. She has seen Dr. Bary Roy for surgery and underwent left breast wide excision with sentinel node biopsy on 01/12/18 with complete pathologic response. Currently status post radiation with Dr. Baruch Roy. She is currently on tamoxifen.    She was previously followed by Dr. Mike Roy but has transitioned her care to Dr. Rogue Roy in the interim. She has established care with Dr. Dossie Roy for chronic pain and has pain contract.   She had delayed wound healing which prolonged radiation but she has now completed treatment.   Today, she reports she is doing well. She does have intermittent pelvic pain that is not too bothersome. She does not have any abnormal vaginal bleeding or discharge.   Gynecologic Oncology History Carolyn Roy is a pleasant female who is seen in consultation from Dr. Mike Roy for cervical mass.   Patient was initially referred to medical oncology for clinical stage IIIa left breast cancer, s/p biopsy. Pathology revealed grade III invasive ductal carcinoma area axillary FNA revealed malignant cells.  Tumor was ER positive, PR positive, HER-2/neu negative.  CA-27-29 was normal.  FSH and estradiol confirmed premenopausal status.  CT C/A/P on 06/29/2017 revealed: an infiltrative heterogeneously enhancing mass in the cervical region extending into the lower uterine segment that was concerning for primary cervical carcinoma.  IMPRESSION: 1. Large mass in  the central aspect of the left breast measuring pproximately 3.7 x 4.4 x 4.2 cm. There are borderline enlarged  left axillary and left internal mammary lymph nodes which are nonspecific but suspicious in the setting of known breast cancer. No other definite signs of metastatic disease elsewhere in the chest, abdomen or pelvis. 2. However, there is what appears to be an infiltrative heterogeneously enhancing mass in the cervical region extending into the lower uterine segment, concerning for concurrent primary cervical carcinoma.  "Cervical region extending into the lower uterine segment overall estimated to measure approximately 4.5 x 5.2 x 4.1"  Per Dr. Mike Roy, currently they plan for neoadjuvant chemotherapy with standard dose dense AC every 2 weeks for 4 cycles followed by weekly Taxol for 12 weeks.    She had not been getting regular Pap smears/pelvic exams and estimates last Pap smear approximately 2009.  She reports a history of abnormal pap smears and believes that she may have had '3 spots cut out of my cervix 30 years ago'.   07/05/2017 she was seen in [facility] and exam revealed 2 cm endocervical lesion located at the os and ballooning out the cervix, tender, mobile, firm to palpation  07/05/2017: DIAGNOSIS:  A. CERVIX; BIOPSY:  - INFLAMED POLYP WITH TUBAL METAPLASIA AND FOCAL GLANDULAR ATYPIA, SEE NOTE.  - MULTIPLE DEEPER LEVELS WERE EXAMINED.   Note: A p16 stain is obtained and highlights a focal area of atypia as well as inflamed epithelium. Pilar Plate malignancy is not identified in this material.   Pap: unsatisfactory for evaluation, insufficient squamous cellularity. HRHPV negative.   07/17/2017 IMPRESSION: 1. Hypermetabolic central left breast mass, maximum SUV 8.3, compatible with malignancy. Subtle accentuated activity in a small left axillary lymph node, but below background blood pool activity. 2. The cervical prominence noted on recent CT is not appreciably hypermetabolic. If there is underlying cervical cancer it is not appreciably hypermetabolic. However, there is some mild  prominence of the endometrial stripe, which could result from cervical stenosis. 3.  Aortic Atherosclerosis (ICD10-I70.0).  07/14/2017 she underwent 1) placement of radiologic marker and left axillary nodal metastasis; 2) right subclavian PowerPort placement with ultrasound and fluoroscopic guidance with Dr. Bary Roy.  MRI pelvis 10/27/2017 Reproductive: The uterus measures 8.6 x 4.8 x 6.6 cm (volume = 140 cm^3). There is fluid identified within the endometrial canal. No focal enhancing endometrial mass identified. There are 4 nabothian cyst identified within the wall of the cervix. The largest measures 1.6 cm. No discrete cervical mass identified. Normal physiologic appearance of the ovaries. No adnexal mass. Other:  No free fluid or fluid collections. Musculoskeletal: No suspicious bone lesions identified. IMPRESSION: 1. No endometrial or cervical mass identified. Correlation with dedicated pelvic exam is advised. 2. Multiple nabothian cysts identified within the wall of the cervix.  Genetic Testing- [employer] 83 gene multi cancer panel.  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  Per genetic counselor, carrier of single [facility] 3 mutation does not confer any increased cancer risk. 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   02/19/2018- US pelvic complete with transvaginal Uterus: 8.6 x 5.0 x 6.1. Mildly heterogeneous myometrium without focal myometrial mass. Nabothian cysts at cervix Endometrium: 40m thickness- complex, heterogeneous with hypoechoic areas.  Right Ovary- 2.8 x 1.4 x 2.6 (normal) Left ovary- not visualized  IMPRESSION: Complex heterogeneous endometrial complex with small amount of endometrial fluid and a questionable area of focal abnormality question mass at mid uterus; recommend sonohysterogram for further evaluation, prior to hysteroscopy or endometrial  biopsy.  Problem List: Patient Active Problem List   Diagnosis Date Noted  . Abnormal MRI, lumbar spine (04/20/2018) 05/09/2018  .  Lumbar facet arthropathy 05/09/2018  . Chronic low back pain (Bilateral (L>R) w/o sciatica 03/14/2018  . Chronic hip pain (Left) 03/14/2018  . Lumbar facet syndrome (Bilateral) (L>R) 03/14/2018  . Idiopathic scoliosis 03/14/2018  . Chronic sacroiliac joint pain (Left) 03/14/2018  . Chronic pain of knee (Right) 03/14/2018  . History of breast cancer 03/14/2018  . Chronic upper extremity pain [facility] Area of Pain) (Bilateral) (R>L) 02/14/2018  . Cancer-related pain 02/14/2018  . Vitamin D deficiency 02/14/2018  . Neuropathic pain 02/14/2018  . Osteoarthritis of knee (Right) 02/14/2018  . Type 2 diabetes mellitus with hyperglycemia, with long-term current use of insulin ([place]) 01/23/2018  . Chronic feet pain (Primary Area of Pain) (Bilateral) (R>L) 01/22/2018  . Neuropathic pain of feet (Bilateral) 01/22/2018  . Chronic knee pain (Secondary Area of Pain) (Bilateral) (R>L) 01/22/2018  . Chronic hand pain [facility] Area of Pain) (Bilateral) (R>L) 01/22/2018  . Chronic pain syndrome 01/22/2018  . Pharmacologic therapy 01/22/2018  . Disorder of skeletal system 01/22/2018  . Problems influencing health status 01/22/2018  . Chronic hip pain (Fourth Area of Pain) (Bilateral) (R>L) 01/22/2018  . Burning sensation of feet (Bilateral) 01/09/2018  . Numbness and tingling in hands (Bilateral) 01/09/2018  . Malignant neoplasm of lower-outer quadrant of left breast of female, estrogen receptor positive ([place]) 11/08/2017  . Chemotherapy-induced neuropathy ([place]) 10/19/2017  . Chemotherapy-induced neutropenia ([place]) 07/29/2017  . Hyperglycemia 07/28/2017  . Goals of care, counseling/discussion 07/20/2017  . Encounter for antineoplastic chemotherapy 07/20/2017  . Cervical polyp 07/05/2017  . Family history of breast cancer   . Cancer of midline of breast, left ([place]) 06/15/2017  . Stage 1 mild COPD by GOLD classification ([place]) 03/31/2017  . Hypercholesterolemia 03/03/2017  . Tobacco abuse 11/15/2016  .  Depression, major, single episode, severe ([place]) 11/15/2016  . Knee pain, left 05/29/2015  . Poorly controlled type 2 diabetes mellitus ([place]) 03/30/2015  . Chronic fatigue 03/30/2015  . Severe depression ([place]) 03/30/2015   Past Medical History: Past Medical History:  Diagnosis Date  . Cancer ([place]) 06/15/2017   5.1 cm, T3,N1 (clinical): ER/ PR positive, Her 2 neu not overexpressed, High Ki 67. Neuoadjuvant chemotherapy.   . Depression   . Diabetes mellitus without complication ([place]) 2694  . Endometriosis   . Family history of breast cancer   . Headache    migraines  . Hyperlipidemia   . Ovarian mass   . Pneumonia    2018   Past Surgical History: Past Surgical History:  Procedure Laterality Date  . AXILLARY LYMPH NODE BIOPSY Left 07/14/2017   Procedure: INSERTION GEL MARK CLIP LEFT AXILLA;  Surgeon: Robert Bellow, MD;  Location: [facility] ORS;  Service: General;  Laterality: Left;  . BREAST BIOPSY Left    Dr Orlene Och BREAST METASTATIC CARCINOMA  . BREAST LUMPECTOMY Left 01/12/2018  . OOPHORECTOMY    . PARTIAL MASTECTOMY WITH NEEDLE LOCALIZATION Left 01/12/2018   Procedure: PARTIAL MASTECTOMY WITH NEEDLE LOCALIZATION;  Surgeon: Robert Bellow, MD;  Location: [facility] ORS;  Service: General;  Laterality: Left;  . PORTACATH PLACEMENT Right 07/14/2017   Procedure: INSERTION PORT-A-CATH;  Surgeon: Robert Bellow, MD;  Location: [facility] ORS;  Service: General;  Laterality: Right;  . SENTINEL NODE BIOPSY Left 01/12/2018   Procedure: SENTINEL NODE BIOPSY;  Surgeon: Robert Bellow, MD;  Location: [facility] ORS;  Service: General;  Laterality: Left;  .  TUBAL LIGATION     Past Gynecologic History:  G3P3 Age of menarche: 56 Duration of periods: 1-2 days Contraception: tubal  OB History:  OB History  Gravida Para Term Preterm AB Living  3 3          SAB TAB Ectopic Multiple Live Births               # Outcome Date GA Lbr Len/2nd Weight Sex Delivery Anes PTL Lv  3 Para           2  Para           1 Para             Obstetric Comments  1st Menstrual Cycle:  14   1st Pregnancy:  66    Family History: Family History  Problem Relation Age of Onset  . Other Father        No info about father or paternal relatives  . Diabetes Brother   . Pancreatitis Brother   . Prostate cancer Brother 65       currently 42 / maternal half-brother  . Breast cancer Maternal Grandmother 40       deceased 77s  . Breast cancer Maternal Aunt 65       currently 68  . Breast cancer Other 63       mother's sister; deceased 50  . Breast cancer Other        mother's sister; age at dx unknown    Social History: Social History   Socioeconomic History  . Marital status: Single    Spouse name: Not on file  . Number of children: Not on file  . Years of education: Not on file  . Highest education level: Not on file  Occupational History  . Not on file  Social Needs  . Financial resource strain: Not on file  . Food insecurity:    Worry: Not on file    Inability: Not on file  . Transportation needs:    Medical: Not on file    Non-medical: Not on file  Tobacco Use  . Smoking status: Current Every Day Smoker    Packs/day: 0.50    Years: 11.00    Pack years: 5.50    Types: Cigarettes  . Smokeless tobacco: Former Systems developer    Types: Snuff  Substance and Sexual Activity  . Alcohol use: No    Alcohol/week: 0.0 standard drinks  . Drug use: No  . Sexual activity: Yes  Lifestyle  . Physical activity:    Days per week: Not on file    Minutes per session: Not on file  . Stress: Not on file  Relationships  . Social connections:    Talks on phone: Not on file    Gets together: Not on file    Attends religious service: Not on file    Active member of club or organization: Not on file    Attends meetings of clubs or organizations: Not on file    Relationship status: Not on file  . Intimate partner violence:    Fear of current or ex partner: Not on file    Emotionally abused: Not  on file    Physically abused: Not on file    Forced sexual activity: Not on file  Other Topics Concern  . Not on file  Social History Narrative  . Not on file   Allergies: Allergies  Allergen Reactions  . Aspirin Nausea And Vomiting   Current Medications:  Current Outpatient Medications  Medication Sig Dispense Refill  . albuterol (PROVENTIL HFA;VENTOLIN HFA) 108 (90 Base) MCG/ACT inhaler Inhale 2 puffs into the lungs every 6 (six) hours as needed for wheezing or shortness of breath. 1 Inhaler 0  . aspirin-acetaminophen-caffeine (EXCEDRIN MIGRAINE) 250-250-65 MG tablet Take 2 tablets by mouth daily as needed for headache.    . Blood Glucose Monitoring Suppl (FIFTY50 GLUCOSE METER 2.0) w/Device KIT Use as directed    . calcium carbonate (CALCIUM 600) 600 MG TABS tablet Take 1 tablet (600 mg total) by mouth 2 (two) times daily with a meal. 60 tablet 0  . canagliflozin (INVOKANA) 300 MG TABS tablet Take 300 mg by mouth daily.    . Cholecalciferol (VITAMIN D3) 125 MCG (5000 UT) CAPS Take 1 capsule (5,000 Units total) by mouth daily with breakfast. Take along with calcium and magnesium. 30 capsule 5  . gabapentin (NEURONTIN) 300 MG capsule Take 3 capsules (900 mg total) by mouth 4 (four) times daily. 360 capsule 2  . glucose blood (ONE TOUCH ULTRA TEST) test strip Use up to 4 times/day 100 each 12  . Insulin Glargine (BASAGLAR KWIKPEN) 100 UNIT/ML SOPN INJECT 0.24 MLS (24 UNITS TOTAL) INTO THE SKIN AT BEDTIME. (Patient taking differently: Inject 44 Units into the skin at bedtime. ) 15 pen 2  . Insulin Pen Needle 32G X 4 MM MISC 1 Units by Does not apply route every morning. Pen needles 90 each 3  . lidocaine-prilocaine (EMLA) cream Apply to affected area once (Patient taking differently: Apply 1 application topically daily as needed (port access). Ap) 30 g 3  . Magnesium 500 MG CAPS Take 1 capsule (500 mg total) by mouth 2 (two) times daily at 8 am and 10 pm. 60 capsule 5  . ondansetron  (ZOFRAN) 8 MG tablet Take 1 tablet (8 mg total) by mouth 2 (two) times daily as needed. Start on the third day after chemotherapy. 30 tablet 1  . oxyCODONE (OXY IR/ROXICODONE) 5 MG immediate release tablet Take 1-2 tablets (5-10 mg total) by mouth every 6 (six) hours as needed for up to 30 days for severe pain. Must last 30 days. 60 tablet 0  . [START ON 06/08/2018] oxyCODONE (OXY IR/ROXICODONE) 5 MG immediate release tablet Take 1 tablet (5 mg total) by mouth every 4 (four) hours as needed for up to 30 days for severe pain. 60 tablet 0  . oxyCODONE (OXY IR/ROXICODONE) 5 MG immediate release tablet Take 1-2 tablets (5-10 mg total) by mouth every 6 (six) hours as needed for up to 30 days for severe pain. 60 tablet 0  . prochlorperazine (COMPAZINE) 10 MG tablet Take 1 tablet (10 mg total) by mouth every 6 (six) hours as needed (Nausea or vomiting). 30 tablet 1  . tamoxifen (NOLVADEX) 20 MG tablet Take 1 tablet (20 mg total) by mouth daily. 30 tablet 4   No current facility-administered medications for this visit.    Facility-Administered Medications Ordered in Other Visits  Medication Dose Route Frequency Provider Last Rate Last Dose  . heparin lock flush 100 unit/mL  500 Units Intravenous Once Corcoran, Melissa C, MD      . sodium chloride flush (NS) 0.9 % injection 10 mL  10 mL Intravenous Once Lequita Asal, MD       Review of Systems General:  no complaints Breasts: just completed chemotherapy/surgery and currently on tamoxifen Genitourinary/Sexual: urinary pressure  GI: pelvic pain as noted, no abdominal pain Ob/Gyn: no complaints as noted in  HPI Hematology: no abnormal bleeding   Objective:  Physical Examination:    ECOG Performance Status: 0 - Asymptomatic   GENERAL: Patient is a well appearing female in no acute distress HEENT:  Atraumatic and normocephalic  NEURO: Alert and oriented     Assessment:  Carolyn Roy is a 53 y.o. female with incidental finding of cervical  mass on CT scan 5/19 which was performed for treatment planning for breast cancer. Radiologic and pathology findings from 5/19 biopsy consistent with inflamed cervical polyp with focal glandular atypia. Pap unsatisfactory, HRHPV negative. PET negative for appreciable metabolic activity in cervix. Repeat cervical biopsy: enodmetrial polyp with endometritis  Slightly thickened endometrial stripe; MRI fluid in uterus and no mass - EMBx atrophy  Mild pelvic pain uncertain etiology, may be due to chronic endometritis versus other etiology.   Urinary pressure, which she describes with obstructive type symptoms  Medical co-morbidities complicating care: Type 2 Diabetes Mellitus (poorly controlled), Smoker, stage T3N1Mx (stage II) Breast Cancer, COPD. There is no height or weight on file to calculate BMI.  Plan:   Problem List Items Addressed This Visit      Genitourinary   Cervical polyp - Primary   Relevant Orders   Urinalysis, Complete w Microscopic   Urine culture     I offered her a course of antiobiotics for pelvic pain in the setting of chronic endometritis. The biopsy also noted "ACUTELY INFLAMED ENDOCERVICAL MUCOSA WITH REACTIVE CHANGES". She declined at this time since her symptoms are not too bothersome and we will continue to follow.   Urinary symptoms; ua and culture ordered. We recommend Urology consult given her description of "blockage" type sensation.   Schedule visit for pelvic exam when COVID-19 pandemic improves.   I discussed the assessment and treatment plan with the patient. The patient was provided an opportunity to ask questions and all were answered. The patient agreed with the plan and demonstrated an understanding of the instructions.   The patient was advised to call back or seek an in-person evaluation if the symptoms worsen or if the condition fails to improve as anticipated.   I provided 12 minutes of face-to-face video visit time during this encounter, and >  50% was spent counseling as documented under my assessment & plan.  Carolyn Rutter, DNP, AGNP-C [place] at [facility] [phone] (work cell) [phone] (office)  Carolyn Rutter, NP, scribed this note   Gaetana Michaelis, MD   CC:  Referring Providers: Dr. Charlaine Dalton Dr. Hervey Roy

## 2018-05-30 NOTE — Telephone Encounter (Signed)
Called and spoke with Mrs Carolyn Roy. Due to the COVID-19 pandemic gyn onc physicians are not currently in [place] [facility]. We would like to arrange a webex visit for today. [name] is agreeable and link sent.

## 2018-06-07 ENCOUNTER — Other Ambulatory Visit: Payer: Self-pay

## 2018-06-07 ENCOUNTER — Inpatient Hospital Stay: Payer: No Typology Code available for payment source

## 2018-06-07 DIAGNOSIS — Z95828 Presence of other vascular implants and grafts: Secondary | ICD-10-CM

## 2018-06-07 DIAGNOSIS — Z452 Encounter for adjustment and management of vascular access device: Secondary | ICD-10-CM | POA: Diagnosis not present

## 2018-06-07 DIAGNOSIS — N841 Polyp of cervix uteri: Secondary | ICD-10-CM

## 2018-06-07 LAB — URINALYSIS, COMPLETE (UACMP) WITH MICROSCOPIC
Bacteria, UA: NONE SEEN
Bilirubin Urine: NEGATIVE
Glucose, UA: >=500 mg/dL — AB
Hgb urine dipstick: NEGATIVE
Ketones, ur: NEGATIVE mg/dL
Leukocytes,Ua: NEGATIVE
Nitrite: NEGATIVE
Protein, ur: NEGATIVE mg/dL
Specific Gravity, Urine: 1.035 — ABNORMAL HIGH (ref 1.005–1.030)
pH: 6 (ref 5.0–8.0)

## 2018-06-07 MED ORDER — SODIUM CHLORIDE 0.9% FLUSH
10.0000 mL | Freq: Once | INTRAVENOUS | Status: AC
  Administered 2018-06-07: 10 mL via INTRAVENOUS
  Filled 2018-06-07: qty 10

## 2018-06-07 MED ORDER — HEPARIN SOD (PORK) LOCK FLUSH 100 UNIT/ML IV SOLN
INTRAVENOUS | Status: AC
  Filled 2018-06-07: qty 5

## 2018-06-07 MED ORDER — HEPARIN SOD (PORK) LOCK FLUSH 100 UNIT/ML IV SOLN
500.0000 [IU] | Freq: Once | INTRAVENOUS | Status: AC
  Administered 2018-06-07: 500 [IU] via INTRAVENOUS

## 2018-06-08 LAB — URINE CULTURE: Culture: NO GROWTH

## 2018-06-14 ENCOUNTER — Telehealth: Payer: Self-pay | Admitting: Nurse Practitioner

## 2018-06-14 DIAGNOSIS — R39198 Other difficulties with micturition: Secondary | ICD-10-CM

## 2018-06-14 NOTE — Telephone Encounter (Signed)
Called patient with results of UA and culture which were negative for infection. She continues for feel a blockage/blocked type sensation when initiating urine stream. Not painful and not worsening. We discussed Dr. Gershon Crane recommendation to refer to urology for further evaluation which she is agreeable to. Referral sent today. No other complaints.

## 2018-06-19 ENCOUNTER — Inpatient Hospital Stay: Payer: 59 | Attending: Internal Medicine | Admitting: Internal Medicine

## 2018-06-19 ENCOUNTER — Ambulatory Visit: Payer: 59 | Admitting: Internal Medicine

## 2018-06-19 ENCOUNTER — Encounter: Payer: Self-pay | Admitting: Internal Medicine

## 2018-06-19 ENCOUNTER — Other Ambulatory Visit: Payer: Self-pay

## 2018-06-19 DIAGNOSIS — C50512 Malignant neoplasm of lower-outer quadrant of left female breast: Secondary | ICD-10-CM | POA: Diagnosis not present

## 2018-06-19 DIAGNOSIS — Z170 Estrogen receptor positive status [ER+]: Secondary | ICD-10-CM

## 2018-06-19 DIAGNOSIS — Z5111 Encounter for antineoplastic chemotherapy: Secondary | ICD-10-CM | POA: Insufficient documentation

## 2018-06-19 DIAGNOSIS — E119 Type 2 diabetes mellitus without complications: Secondary | ICD-10-CM | POA: Insufficient documentation

## 2018-06-19 DIAGNOSIS — Z794 Long term (current) use of insulin: Secondary | ICD-10-CM | POA: Insufficient documentation

## 2018-06-19 DIAGNOSIS — Z79810 Long term (current) use of selective estrogen receptor modulators (SERMs): Secondary | ICD-10-CM | POA: Insufficient documentation

## 2018-06-19 MED ORDER — ANASTROZOLE 1 MG PO TABS: 1.0000 mg | ORAL_TABLET | Freq: Every day | ORAL | 3 refills | Status: DC

## 2018-06-19 NOTE — Assessment & Plan Note (Addendum)
#   breast cancer Stage III ER/PR pos her 2 NEG. currently on tamoxifen tolerating poorly.  #Discontinue tamoxifen.  I would recommend aromatase inhibitor; hormonal profile suggestive of postmenopausal status.  However I would recommend Zoladex injections.   #Recommend anastrozole.  Again reviewed the potential side effects.  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  Stable  # DM-2 on insulin-stable  #Adjuvant bisphosphonate-will discuss  # DISPOSITION: #Zoladex injection 1 week. #Follow-up in 3 weeks-MD-no labs- Dr.B

## 2018-06-19 NOTE — Progress Notes (Signed)
I connected with Carolyn Roy on 06/19/18 at  1:00 PM EDT by telephone visit and verified that I am speaking with the correct person using two identifiers.  I discussed the limitations, risks, security and privacy concerns of performing an evaluation and management service by telemedicine and the availability of in-person appointments. I also discussed with the patient that there may be a patient responsible charge related to this service. The patient expressed understanding and agreed to proceed.    Other persons participating in the visit and their role in the encounter: none Patient's location: home  Provider's location: office   Oncology History   # MAY 2019-  clinical stage IIIA (T3N1Mx) left breast cancer s/p biopsy on 06/14/2017. -Pathology revealed grade III invasive ductal carcinoma. -Axillary FNA revealed malignant cells c/w metastatic carcinoma. Tumor was ER + (90%), PR + (30%), Her2/neu - and Ki67 70%.  CA27.29 was 7.8 on 06/14/2017.  # She received 4 cycles of AC with Neulasta support (07/20/2017 - 08/31/2017).;  neoadjuvant Taxol on 09/14/2017.  #DEC 2019- Lumpectomy/sentinel lymph node biopsy [Dr.[name]]-complete pathologic response  # s/p RT [delayed sec to wound infection; Dr.[name]] finished RT [4/12]  # April 14th 2020- START TAM; stopped in mid-May secondary intolerance [severe migraines].  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 Secord/Berchuck]-negative for malignancy/ # DM-2- poorly controlled.   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  -------------------------------------------  DIAGNOSIS: left breast cancer  STAGE:  III       ;GOALS: cure  CURRENT/MOST RECENT THERAPY Tam      Cancer of midline of breast, left ([place])   06/15/2017 Initial Diagnosis    Cancer of midline of breast, left ([place])    06/26/2017 -  Chemotherapy    The patient had DOXOrubicin (ADRIAMYCIN) chemo injection 122 mg, 60 mg/m2 = 122 mg, Intravenous,  Once, 4 of 4 cycles Administration: 122 mg (07/20/2017), 122 mg (08/03/2017), 122 mg (08/17/2017), 122 mg (08/31/2017) palonosetron (ALOXI) injection 0.25 mg, 0.25 mg, Intravenous,  Once, 4 of 4 cycles Administration: 0.25 mg (07/20/2017), 0.25 mg (08/03/2017), 0.25 mg (08/17/2017), 0.25 mg (08/31/2017) pegfilgrastim (NEULASTA) injection 6 mg, 6 mg, Subcutaneous, Once, 5 of 5 cycles Administration: 6 mg (07/21/2017), 6 mg (08/04/2017), 6 mg (08/18/2017), 6 mg (09/01/2017) cyclophosphamide (CYTOXAN) 1,220 mg in sodium chloride 0.9 % 250 mL chemo infusion, 600 mg/m2 = 1,220 mg, Intravenous,  Once, 4 of 4 cycles Administration: 1,220 mg (07/20/2017), 1,220 mg (08/03/2017), 1,220 mg (08/17/2017), 1,220 mg (08/31/2017) PACLitaxel (TAXOL) 162 mg in sodium chloride 0.9 % 250 mL chemo infusion (</= 31m/m2), 80 mg/m2 = 162 mg, Intravenous,  Once, 10 of 12 cycles Dose modification: 65 mg/m2 (original dose 80 mg/m2, Cycle 13, Reason: Provider Judgment, Comment: neuropathy) Administration: 162 mg (09/14/2017), 162 mg (09/21/2017), 162 mg (09/28/2017), 162 mg (10/05/2017), 162 mg (10/12/2017), 162 mg (10/19/2017), 162 mg (10/26/2017), 132 mg (11/02/2017), 132 mg (11/09/2017), 132 mg (11/16/2017) fosaprepitant (EMEND) 150 mg, dexamethasone (DECADRON) 12 mg in sodium chloride 0.9 % 145 mL IVPB, , Intravenous,  Once, 4 of 4 cycles Administration:  (07/20/2017),  (08/03/2017),  (08/17/2017),  (08/31/2017)  for chemotherapy treatment.      Malignant neoplasm of lower-outer quadrant of left breast of female, estrogen receptor positive ([place]   11/08/2017 Initial Diagnosis    Malignant neoplasm of lower-outer quadrant of left breast of female, estrogen receptor positive ([place]  Chief Complaint: breast cancer    History of present illness:Carolyn Roy 53 y.o.  female with history of ER PR positive HER-2  negative stage III breast cancer currently on tamoxifen.  Patient started tamoxifen approximately month ago.  She noted to have severe intense migraines; with significant nausea vomiting she is also quite distraught with hair loss.  Emotionally very labile.  Patient has not had menstrual cycles for the last 2 years.  History of nephrectomy on the left side as per patient.  Observation/objective:  Assessment and plan: Malignant neoplasm of lower-outer quadrant of left breast of female, estrogen receptor positive ([place]) # breast cancer Stage III ER/PR pos her 2 NEG. currently on tamoxifen tolerating poorly.  #Discontinue tamoxifen.  I would recommend aromatase inhibitor; hormonal profile suggestive of postmenopausal status.  However I would recommend Zoladex injections.   #Recommend anastrozole.  Again reviewed the potential side effects.  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  Stable  # DM-2 on insulin-stable  #Adjuvant bisphosphonate-will discuss  # DISPOSITION: #Zoladex injection 1 week. #Follow-up in 3 weeks-MD-no labs- Dr.B     Follow-up instructions:  I discussed the assessment and treatment plan with the patient.  The patient was provided an opportunity to ask questions and all were answered.  The patient agreed with the plan and demonstrated understanding of instructions.  The patient was advised to call back or seek an in person evaluation if the symptoms worsen or if the condition fails to improve as anticipated.  I provided 12  minutes of non face-to-face telephone visit time during this encounter, and > 50% was spent counseling as documented under my assessment & plan.   Dr. Charlaine Dalton [facility] at [facility] 06/19/2018 1:45 PM

## 2018-06-25 ENCOUNTER — Other Ambulatory Visit: Payer: Self-pay

## 2018-06-25 ENCOUNTER — Encounter: Payer: Self-pay | Admitting: Radiation Oncology

## 2018-06-25 ENCOUNTER — Inpatient Hospital Stay: Payer: 59

## 2018-06-25 ENCOUNTER — Ambulatory Visit
Admission: RE | Admit: 2018-06-25 | Discharge: 2018-06-25 | Disposition: A | Discharge status: Home / Self Care | Payer: 59 | Source: Ambulatory Visit | Attending: Radiation Oncology | Admitting: Radiation Oncology

## 2018-06-25 VITALS — BP 120/84 | HR 93 | Temp 96.4°F | Resp 16 | Wt 183.9 lb

## 2018-06-25 DIAGNOSIS — C50512 Malignant neoplasm of lower-outer quadrant of left female breast: Secondary | ICD-10-CM

## 2018-06-25 DIAGNOSIS — E119 Type 2 diabetes mellitus without complications: Secondary | ICD-10-CM | POA: Diagnosis not present

## 2018-06-25 DIAGNOSIS — Z170 Estrogen receptor positive status [ER+]: Secondary | ICD-10-CM

## 2018-06-25 DIAGNOSIS — Z79811 Long term (current) use of aromatase inhibitors: Secondary | ICD-10-CM | POA: Diagnosis not present

## 2018-06-25 DIAGNOSIS — C50212 Malignant neoplasm of upper-inner quadrant of left female breast: Secondary | ICD-10-CM | POA: Diagnosis not present

## 2018-06-25 DIAGNOSIS — Z923 Personal history of irradiation: Secondary | ICD-10-CM | POA: Diagnosis not present

## 2018-06-25 DIAGNOSIS — Z79810 Long term (current) use of selective estrogen receptor modulators (SERMs): Secondary | ICD-10-CM | POA: Diagnosis not present

## 2018-06-25 DIAGNOSIS — Z5111 Encounter for antineoplastic chemotherapy: Secondary | ICD-10-CM | POA: Diagnosis not present

## 2018-06-25 DIAGNOSIS — Z794 Long term (current) use of insulin: Secondary | ICD-10-CM | POA: Diagnosis not present

## 2018-06-25 MED ORDER — GOSERELIN ACETATE 10.8 MG ~~LOC~~ IMPL
10.8000 mg | DRUG_IMPLANT | Freq: Once | SUBCUTANEOUS | Status: AC
  Administered 2018-06-25: 10.8 mg via SUBCUTANEOUS
  Filled 2018-06-25: qty 10.8

## 2018-06-25 NOTE — Progress Notes (Signed)
Radiation Oncology Follow up Note  Name: Carolyn Roy   Date:   06/25/2018 MRN:  [id number] DOB: Apr 20, 1965    This 53 y.o. female presents to the clinic today for 1 month follow-up status post whole breast radiation to her left breast for stage IIIa invasive mammary carcinoma.  REFERRING PROVIDER: No ref. provider found  HPI: Patient is a 53 year old female now at 1 month having completed whole breast radiation plus peripheral nodal radiation for stage IIIa (T3 N1 M0) invasive mammary carcinoma status post neoadjuvant chemotherapy.  Tumor is ER PR positive she was tried on tamoxifen has been switched to anastrozole which she is tolerating well.  She still has some slight tenderness in the breast which we expected no other significant problems..  COMPLICATIONS OF TREATMENT: none  FOLLOW UP COMPLIANCE: keeps appointments   PHYSICAL EXAM:  There were no vitals taken for this visit. Lungs are clear to A&P cardiac examination essentially unremarkable with regular rate and rhythm. No dominant mass or nodularity is noted in either breast in 2 positions examined. Incision is well-healed. No axillary or supraclavicular adenopathy is appreciated. Cosmetic result is excellent.  Well-developed well-nourished patient in NAD. HEENT reveals PERLA, EOMI, discs not visualized.  Oral cavity is clear. No oral mucosal lesions are identified. Neck is clear without evidence of cervical or supraclavicular adenopathy. Lungs are clear to A&P. Cardiac examination is essentially unremarkable with regular rate and rhythm without murmur rub or thrill. Abdomen is benign with no organomegaly or masses noted. Motor sensory and DTR levels are equal and symmetric in the upper and lower extremities. Cranial nerves II through XII are grossly intact. Proprioception is intact. No peripheral adenopathy or edema is identified. No motor or sensory levels are noted. Crude visual fields are within normal range.  RADIOLOGY RESULTS:  No current films for review  PLAN: Present time patient is doing well 1 month out from whole breast radiation and pleased with her overall progress.  She continues on anastrozole without side effect.  I have asked to see her back in 4 to 5 months for follow-up.  Patient knows to call sooner with any concerns.  I would like to take this opportunity to thank you for allowing me to participate in the care of your patient.Noreene Filbert, MD

## 2018-06-26 ENCOUNTER — Ambulatory Visit: Payer: 59

## 2018-07-01 ENCOUNTER — Other Ambulatory Visit: Payer: Self-pay | Admitting: Pain Medicine

## 2018-07-01 DIAGNOSIS — E559 Vitamin D deficiency, unspecified: Secondary | ICD-10-CM

## 2018-07-05 ENCOUNTER — Encounter: Payer: Self-pay | Admitting: Pain Medicine

## 2018-07-09 ENCOUNTER — Other Ambulatory Visit: Payer: Self-pay | Admitting: Pain Medicine

## 2018-07-09 ENCOUNTER — Ambulatory Visit: Payer: 59 | Attending: Nurse Practitioner | Admitting: Pain Medicine

## 2018-07-09 ENCOUNTER — Other Ambulatory Visit: Payer: Self-pay

## 2018-07-09 DIAGNOSIS — G8929 Other chronic pain: Secondary | ICD-10-CM

## 2018-07-09 DIAGNOSIS — M792 Neuralgia and neuritis, unspecified: Secondary | ICD-10-CM

## 2018-07-09 DIAGNOSIS — G894 Chronic pain syndrome: Secondary | ICD-10-CM | POA: Diagnosis not present

## 2018-07-09 DIAGNOSIS — M25552 Pain in left hip: Secondary | ICD-10-CM

## 2018-07-09 DIAGNOSIS — M79641 Pain in right hand: Secondary | ICD-10-CM | POA: Diagnosis not present

## 2018-07-09 DIAGNOSIS — M79671 Pain in right foot: Secondary | ICD-10-CM | POA: Diagnosis not present

## 2018-07-09 DIAGNOSIS — G620 Drug-induced polyneuropathy: Secondary | ICD-10-CM

## 2018-07-09 DIAGNOSIS — G5793 Unspecified mononeuropathy of bilateral lower limbs: Secondary | ICD-10-CM

## 2018-07-09 DIAGNOSIS — M25561 Pain in right knee: Secondary | ICD-10-CM | POA: Diagnosis not present

## 2018-07-09 DIAGNOSIS — M25562 Pain in left knee: Secondary | ICD-10-CM

## 2018-07-09 DIAGNOSIS — M79672 Pain in left foot: Secondary | ICD-10-CM

## 2018-07-09 DIAGNOSIS — T451X5A Adverse effect of antineoplastic and immunosuppressive drugs, initial encounter: Secondary | ICD-10-CM

## 2018-07-09 DIAGNOSIS — M25551 Pain in right hip: Secondary | ICD-10-CM

## 2018-07-09 DIAGNOSIS — E559 Vitamin D deficiency, unspecified: Secondary | ICD-10-CM

## 2018-07-09 DIAGNOSIS — M79642 Pain in left hand: Secondary | ICD-10-CM

## 2018-07-09 MED ORDER — MAGNESIUM 500 MG PO CAPS: 500.0000 mg | ORAL_CAPSULE | Freq: Two times a day (BID) | ORAL | 0 refills | Status: DC

## 2018-07-09 MED ORDER — GABAPENTIN 300 MG PO CAPS: 900.0000 mg | ORAL_CAPSULE | Freq: Four times a day (QID) | ORAL | 0 refills | Status: DC

## 2018-07-09 MED ORDER — OXYCODONE HCL 5 MG PO TABS: 5.0000 mg | ORAL_TABLET | ORAL | 0 refills | Status: DC | PRN

## 2018-07-09 MED ORDER — VITAMIN D3 125 MCG (5000 UT) PO CAPS: 1.0000 | ORAL_CAPSULE | Freq: Every day | ORAL | 0 refills | Status: DC

## 2018-07-09 MED ORDER — OXYCODONE HCL 5 MG PO TABS: 5.0000 mg | ORAL_TABLET | Freq: Four times a day (QID) | ORAL | 0 refills | Status: DC | PRN

## 2018-07-09 NOTE — Progress Notes (Signed)
Pain Management Virtual Encounter Note - Virtual Visit via Telephone Telehealth (real-time audio visits between healthcare provider and patient).  Patient's Phone No. & Preferred Pharmacy:  [phone] (home); [phone] (mobile); (Preferred) [phone] [email or web address]  [id number] - [place], [place] - [address] [address] Phone: [phone] Fax: [phone]   Pre-screening note:  Our staff contacted Carolyn Roy and offered her an "in person", "face-to-face" appointment versus a telephone encounter. She indicated preferring the telephone encounter, at this time.  Reason for Virtual Visit: COVID-19*  Social distancing based on CDC and AMA recommendations.   I contacted Carolyn Roy on 07/09/2018 at 1:58 PM via telephone.      I clearly identified myself as Gaspar Cola, MD. I verified that I was speaking with the correct person using two identifiers (Name and date of birth: 11/06/1965).  Advanced Informed Consent I sought verbal advanced consent from Carolyn Roy for virtual visit interactions. I informed Carolyn Roy of possible security and privacy concerns, risks, and limitations associated with providing "not-in-person" medical evaluation and management services. I also informed Carolyn Roy of the availability of "in-person" appointments. Finally, I informed her that there would be a charge for the virtual visit and that she could be  personally, fully or partially, financially responsible for it. Carolyn Roy expressed understanding and agreed to proceed.   Historic Elements   Carolyn Roy is a 53 y.o. year old, female patient evaluated today after her last encounter by our practice on 07/09/2018. Carolyn Roy  has a past medical history of Cancer ([place]) (06/15/2017), Depression, Diabetes mellitus without complication ([place]) (8841), Endometriosis, Family history of breast cancer, Headache, Hyperlipidemia, Ovarian mass, and Pneumonia. She also  has  a past surgical history that includes Oophorectomy; Tubal ligation; Portacath placement (Right, 07/14/2017); Axillary lymph node biopsy (Left, 07/14/2017); Breast lumpectomy (Left, 01/12/2018); Breast biopsy (Left); Partial mastectomy with needle localization (Left, 01/12/2018); and Sentinel node biopsy (Left, 01/12/2018). Carolyn Roy has a current medication list which includes the following prescription(s): albuterol, anastrozole, aspirin-acetaminophen-caffeine, fifty50 glucose meter 2.0, canagliflozin, vitamin d3, gabapentin, glucose blood, goserelin, basaglar kwikpen, insulin pen needle, lidocaine-prilocaine, magnesium, metformin, ondansetron, oxycodone, oxycodone, oxycodone, and prochlorperazine, and the following Facility-Administered Medications: heparin lock flush and sodium chloride flush. She  reports that she has been smoking cigarettes. She has a 5.50 pack-year smoking history. She has quit using smokeless tobacco.  Her smokeless tobacco use included snuff. She reports that she does not drink alcohol or use drugs. Carolyn Roy is allergic to aspirin.   HPI  I last communicated with her on 07/09/2018. Today, she is being contacted for medication management.  Patient indicates doing well not having any side effects or problems with any of her medications.  Pharmacotherapy Assessment  Analgesic: Oxycodone IR 5 mg 1 tablet p.o. every 6 hours (20 mg/day of oxycodone) (the patient indicates averaging 2 tablets/day = 10 mg/day of oxycodone) MME/day: 15 mg/day.   Monitoring: Pharmacotherapy: No side-effects or adverse reactions reported. [place] PMP: PDMP reviewed during this encounter.       Compliance: No problems identified. Effectiveness: Clinically acceptable. Plan: Refer to "POC".  Pertinent Labs  Renal Function Lab Results  Component Value Date   BUN 12 05/22/2018   CREATININE 0.56 05/22/2018   BCR 16 01/22/2018   GFRAA >60 05/22/2018   GFRNONAA >60 05/22/2018   Hepatic Function Lab Results   Component Value Date   AST 16 05/22/2018   ALT 18 05/22/2018  ALBUMIN 4.2 05/22/2018   UDS Summary  Date Value Ref Range Status  01/22/2018 FINAL  Final    Comment:    ==================================================================== TOXASSURE COMP DRUG ANALYSIS,UR ==================================================================== Test                             Result       Flag       Units Drug Present and Declared for Prescription Verification   Norhydrocodone                 255          EXPECTED   ng/mg creat    Norhydrocodone is an expected metabolite of hydrocodone.   Tramadol                       5912         EXPECTED   ng/mg creat   O-Desmethyltramadol            7009         EXPECTED   ng/mg creat   N-Desmethyltramadol            1209         EXPECTED   ng/mg creat    Source of tramadol is a prescription medication.    O-desmethyltramadol and N-desmethyltramadol are expected    metabolites of tramadol.   Gabapentin                     PRESENT      EXPECTED   Acetaminophen                  PRESENT      EXPECTED Drug Present not Declared for Prescription Verification   Alcohol, Ethyl                 0.242        UNEXPECTED g/dL    Sources of ethyl alcohol include alcoholic beverages or as a    fermentation product of glucose; glucose is present in this    specimen.  Interpret result with caution, as the presence of    ethyl alcohol is likely due, at least in part, to fermentation of    glucose.   Naproxen                       PRESENT      UNEXPECTED Drug Absent but Declared for Prescription Verification   Hydrocodone                    Not Detected UNEXPECTED ng/mg creat    Hydrocodone is almost always present in patients taking this drug    consistently. Absence of hydrocodone could be due to lapse of    time since the last dose or unusual pharmacokinetics (rapid    metabolism).   Prochlorperazine               Not Detected UNEXPECTED   Salicylate                      Not Detected UNEXPECTED    Aspirin, as indicated in the declared medication list, is not    always detected even when used as directed.   Lidocaine                      Not Detected  UNEXPECTED    Lidocaine, as indicated in the declared medication list, is not    always detected even when used as directed. ==================================================================== Test                      Result    Flag   Units      Ref Range   Creatinine              33               mg/dL      >=20 ==================================================================== Declared Medications:  The flagging and interpretation on this report are based on the  following declared medications.  Unexpected results may arise from  inaccuracies in the declared medications.  **Note: The testing scope of this panel includes these medications:  Gabapentin (Neurontin)  Hydrocodone (Norco)  Prochlorperazine (Compazine)  Tramadol (Ultram)  **Note: The testing scope of this panel does not include small to  moderate amounts of these reported medications:  Acetaminophen (Excedrin)  Acetaminophen (Norco)  Aspirin (Excedrin)  Lidocaine (Lidocaine-Prilocaine)  **Note: The testing scope of this panel does not include following  reported medications:  Albuterol  Caffeine (Excedrin)  Canagliflozin (Invokana)  Insulin  Ondansetron (Zofran)  Prilocaine (Lidocaine-Prilocaine)  Vitamin D3 ==================================================================== For clinical consultation, please call [phone]. ====================================================================    Note: Above Lab results reviewed.  Recent imaging  MR LUMBAR SPINE WO CONTRAST CLINICAL DATA:  Low back with LEFT buttock and leg pain for 2 years, worse in the past 9 months. History of breast cancer.  EXAM: MRI LUMBAR SPINE WITHOUT CONTRAST  TECHNIQUE: Multiplanar, multisequence MR imaging of the lumbar spine  was performed. No intravenous contrast was administered.  COMPARISON:  Plain films 03/14/2018  FINDINGS: Segmentation:  Standard  Alignment:  Trace facet mediated anterolisthesis L3-4 and L4-5.  Vertebrae:  No worrisome osseous lesion.  Conus medullaris and cauda equina: Conus extends to the L1 level. Conus and cauda equina appear normal.  Paraspinal and other soft tissues: Unremarkable  Disc levels:  L1-L2:  Normal  L2-L3:  Normal.  L3-L4: Trace anterolisthesis. Annular bulge. Facet arthropathy with joint effusions, greater on the LEFT. No impingement.  L4-L5: Trace anterolisthesis. Annular bulge. Facet arthropathy and ligamentum flavum hypertrophy. Small joint effusions. No impingement.  L5-S1:  Normal disc space.  Mild facet arthropathy.  No impingement.  IMPRESSION: Degenerative trace anterolisthesis L3-4 with annular bulge. Facet arthropathy with joint effusions, greater on the LEFT. No impingement.  Degenerative trace anterolisthesis L4-5 with annular bulge. Facet arthropathy with joint effusions, but no impingement.  No worrisome osseous lesion.  Electronically Signed   By: Staci Righter M.D.   On: 04/20/2018 09:43  Assessment  The primary encounter diagnosis was Chronic pain syndrome. Diagnoses of Chronic feet pain (Primary Area of Pain) (Bilateral) (R>L), Chronic knee pain (Secondary Area of Pain) (Bilateral) (R>L), Chronic hand pain (Tertiary Area of Pain) (Bilateral) (R>L), Chronic hip pain (Fourth Area of Pain) (Bilateral) (R>L), Neuropathic pain of feet (Bilateral), Chemotherapy-induced neuropathy (HCC), Neuropathic pain, and Vitamin D deficiency were also pertinent to this visit.  Plan of Care  I have discontinued Barnetta Chapel A. Roy's calcium carbonate. I have also changed her oxyCODONE and oxyCODONE. Additionally, I am having her maintain her glucose blood, Insulin Pen Needle, albuterol, Basaglar KwikPen, ondansetron, prochlorperazine,  lidocaine-prilocaine, canagliflozin, aspirin-acetaminophen-caffeine, Fifty50 Glucose Meter 2.0, anastrozole, metFORMIN, goserelin, gabapentin, oxyCODONE, Vitamin D3, and Magnesium.  Pharmacotherapy (Medications Ordered): Meds ordered this encounter  Medications  . gabapentin (NEURONTIN)  300 MG capsule    Sig: Take 3 capsules (900 mg total) by mouth 4 (four) times daily.    Dispense:  1200 capsule    Refill:  0    Fill one day early if pharmacy is closed on scheduled refill date. May substitute for generic if available.  [name] oxyCODONE (OXY IR/ROXICODONE) 5 MG immediate release tablet    Sig: Take 1-2 tablets (5-10 mg total) by mouth every 6 (six) hours as needed for up to 30 days for severe pain. Must last 30 days.    Dispense:  60 tablet    Refill:  0    Chronic Pain: STOP Act - Not applicable. Fill 1 day early if closed on scheduled refill date. Do not fill until: 09/17/2018. To last until: 10/17/2018. Instruct to avoid benzodiazepines within 8 hours of opioid.  [name] oxyCODONE (OXY IR/ROXICODONE) 5 MG immediate release tablet    Sig: Take 1 tablet (5 mg total) by mouth every 4 (four) hours as needed for up to 30 days for severe pain. Must last 30 days    Dispense:  60 tablet    Refill:  0    Chronic Pain: STOP Act - Not applicable. Fill 1 day early if closed on scheduled refill date. Do not fill until: 08/18/2018. To last until: 09/17/2018. Instruct to avoid benzodiazepines within 8 hours of opioid.  [name] oxyCODONE (OXY IR/ROXICODONE) 5 MG immediate release tablet    Sig: Take 1-2 tablets (5-10 mg total) by mouth every 6 (six) hours as needed for up to 30 days for severe pain. Must last 30 days    Dispense:  60 tablet    Refill:  0    Chronic Pain: STOP Act - Not applicable. Fill 1 day early if closed on scheduled refill date. Do not fill until: 07/19/2018. To last until: 08/18/2018. Instruct to avoid benzodiazepines within 8 hours of opioid.  . Cholecalciferol (VITAMIN D3) 125 MCG (5000 UT) CAPS    Sig:  Take 1 capsule (5,000 Units total) by mouth daily with breakfast. Take along with calcium and magnesium.    Dispense:  100 capsule    Refill:  0    Fill one day early if pharmacy is closed on scheduled refill date. May substitute for generic if available.  . Magnesium 500 MG CAPS    Sig: Take 1 capsule (500 mg total) by mouth 2 (two) times daily at 8 am and 10 pm.    Dispense:  200 capsule    Refill:  0    Fill one day early if pharmacy is closed on scheduled refill date. May substitute for generic if available.   Orders:  No orders of the defined types were placed in this encounter.  Follow-up plan:   Return for (3 mo) Med-Mgmt, (Virtual Visit).    I discussed the assessment and treatment plan with the patient. The patient was provided an opportunity to ask questions and all were answered. The patient agreed with the plan and demonstrated an understanding of the instructions.  Patient advised to call back or seek an in-person evaluation if the symptoms or condition worsens.  Total duration of non-face-to-face encounter: 12 minutes.  Note by: Gaspar Cola, MD Date: 07/09/2018; Time: 2:13 PM  Note: This dictation was prepared with Dragon dictation. Any transcriptional errors that may result from this process are unintentional.  Disclaimer:  * Given the special circumstances of the COVID-19 pandemic, the federal government has announced that the Office for Civil Rights (  OCR) will exercise its enforcement discretion and will not impose penalties on physicians using telehealth in the event of noncompliance with regulatory requirements under the [place] and Accountability Act (HIPAA) in connection with the good faith provision of telehealth during the NATFT-73 national public health emergency. (AMA)

## 2018-07-09 NOTE — Patient Instructions (Signed)
____________________________________________________________________________________________  Medication Rules  Purpose: To inform patients, and their family members, of our rules and regulations.  Applies to: All patients receiving prescriptions (written or electronic).  Pharmacy of record: Pharmacy where electronic prescriptions will be sent. If written prescriptions are taken to a different pharmacy, please inform the nursing staff. The pharmacy listed in the electronic medical record should be the one where you would like electronic prescriptions to be sent.  Electronic prescriptions: In compliance with the [place] Strengthen Opioid Misuse Prevention (STOP) Act of 2017 (Session Law 2017-74/H243), effective February 07, 2018, all controlled substances must be electronically prescribed. Calling prescriptions to the pharmacy will cease to exist.  Prescription refills: Only during scheduled appointments. Applies to all prescriptions.  NOTE: The following applies primarily to controlled substances (Opioid* Pain Medications).   Patient's responsibilities: 1. Pain Pills: Bring all pain pills to every appointment (except for procedure appointments). 2. Pill Bottles: Bring pills in original pharmacy bottle. Always bring the newest bottle. Bring bottle, even if empty. 3. Medication refills: You are responsible for knowing and keeping track of what medications you take and those you need refilled. The day before your appointment: write a list of all prescriptions that need to be refilled. The day of the appointment: give the list to the admitting nurse. Prescriptions will be written only during appointments. No prescriptions will be written on procedure days. If you forget a medication: it will not be "Called in", "Faxed", or "electronically sent". You will need to get another appointment to get these prescribed. No early refills. Do not call asking to have your prescription filled  early. 4. Prescription Accuracy: You are responsible for carefully inspecting your prescriptions before leaving our office. Have the discharge nurse carefully go over each prescription with you, before taking them home. Make sure that your name is accurately spelled, that your address is correct. Check the name and dose of your medication to make sure it is accurate. Check the number of pills, and the written instructions to make sure they are clear and accurate. Make sure that you are given enough medication to last until your next medication refill appointment. 5. Taking Medication: Take medication as prescribed. When it comes to controlled substances, taking less pills or less frequently than prescribed is permitted and encouraged. Never take more pills than instructed. Never take medication more frequently than prescribed.  6. Inform other Doctors: Always inform, all of your healthcare providers, of all the medications you take. 7. Pain Medication from other Providers: You are not allowed to accept any additional pain medication from any other Doctor or Healthcare provider. There are two exceptions to this rule. (see below) In the event that you require additional pain medication, you are responsible for notifying us, as stated below. 8. Medication Agreement: You are responsible for carefully reading and following our Medication Agreement. This must be signed before receiving any prescriptions from our practice. Safely store a copy of your signed Agreement. Violations to the Agreement will result in no further prescriptions. (Additional copies of our Medication Agreement are available upon request.) 9. Laws, Rules, & Regulations: All patients are expected to follow all Federal and State Laws, Statutes, Rules, & Regulations. Ignorance of the Laws does not constitute a valid excuse. The use of any illegal substances is prohibited. 10. Adopted CDC guidelines & recommendations: Target dosing levels will be  at or below 60 MME/day. Use of benzodiazepines** is not recommended.  Exceptions: There are only two exceptions to the rule of not   receiving pain medications from other Healthcare Providers. 1. Exception #1 (Emergencies): In the event of an emergency (i.e.: accident requiring emergency care), you are allowed to receive additional pain medication. However, you are responsible for: As soon as you are able, call our office [phone], at any time of the day or night, and leave a message stating your name, the date and nature of the emergency, and the name and dose of the medication prescribed. In the event that your call is answered by a member of our staff, make sure to document and save the date, time, and the name of the person that took your information.  2. Exception #2 (Planned Surgery): In the event that you are scheduled by another doctor or dentist to have any type of surgery or procedure, you are allowed (for a period no longer than 30 days), to receive additional pain medication, for the acute post-op pain. However, in this case, you are responsible for picking up a copy of our "Post-op Pain Management for Surgeons" handout, and giving it to your surgeon or dentist. This document is available at our office, and does not require an appointment to obtain it. Simply go to our office during business hours (Monday-Thursday from 8:00 AM to 4:00 PM) (Friday 8:00 AM to 12:00 Noon) or if you have a scheduled appointment with us, prior to your surgery, and ask for it by name. In addition, you will need to provide us with your name, name of your surgeon, type of surgery, and date of procedure or surgery.  *Opioid medications include: morphine, codeine, oxycodone, oxymorphone, hydrocodone, hydromorphone, meperidine, tramadol, tapentadol, buprenorphine, fentanyl, methadone. **Benzodiazepine medications include: diazepam (Valium), alprazolam (Xanax), clonazepam (Klonopine), lorazepam (Ativan), clorazepate  (Tranxene), chlordiazepoxide (Librium), estazolam (Prosom), oxazepam (Serax), temazepam (Restoril), triazolam (Halcion) (Last updated: 04/06/2017) ____________________________________________________________________________________________   ____________________________________________________________________________________________  Medication Recommendations and Reminders  Applies to: All patients receiving prescriptions (written and/or electronic).  Medication Rules & Regulations: These rules and regulations exist for your safety and that of others. They are not flexible and neither are we. Dismissing or ignoring them will be considered "non-compliance" with medication therapy, resulting in complete and irreversible termination of such therapy. (See document titled "Medication Rules" for more details.) In all conscience, because of safety reasons, we cannot continue providing a therapy where the patient does not follow instructions.  Pharmacy of record:   Definition: This is the pharmacy where your electronic prescriptions will be sent.   We do not endorse any particular pharmacy.  You are not restricted in your choice of pharmacy.  The pharmacy listed in the electronic medical record should be the one where you want electronic prescriptions to be sent.  If you choose to change pharmacy, simply notify our nursing staff of your choice of new pharmacy.  Recommendations:  Keep all of your pain medications in a safe place, under lock and key, even if you live alone.   After you fill your prescription, take 1 week's worth of pills and put them away in a safe place. You should keep a separate, properly labeled bottle for this purpose. The remainder should be kept in the original bottle. Use this as your primary supply, until it runs out. Once it's gone, then you know that you have 1 week's worth of medicine, and it is time to come in for a prescription refill. If you do this correctly, it  is unlikely that you will ever run out of medicine.  To make sure that the above recommendation works,   it is very important that you make sure your medication refill appointments are scheduled at least 1 week before you run out of medicine. To do this in an effective manner, make sure that you do not leave the office without scheduling your next medication management appointment. Always ask the nursing staff to show you in your prescription , when your medication will be running out. Then arrange for the receptionist to get you a return appointment, at least 7 days before you run out of medicine. Do not wait until you have 1 or 2 pills left, to come in. This is very poor planning and does not take into consideration that we may need to cancel appointments due to bad weather, sickness, or emergencies affecting our staff.  "Partial Fill": If for any reason your pharmacy does not have enough pills/tablets to completely fill or refill your prescription, do not allow for a "partial fill". You will need a separate prescription to fill the remaining amount, which we will not provide. If the reason for the partial fill is your insurance, you will need to talk to the pharmacist about payment alternatives for the remaining tablets, but again, do not accept a partial fill.  Prescription refills and/or changes in medication(s):   Prescription refills, and/or changes in dose or medication, will be conducted only during scheduled medication management appointments. (Applies to both, written and electronic prescriptions.)  No refills on procedure days. No medication will be changed or started on procedure days. No changes, adjustments, and/or refills will be conducted on a procedure day. Doing so will interfere with the diagnostic portion of the procedure.  No phone refills. No medications will be "called into the pharmacy".  No Fax refills.  No weekend refills.  No Holliday refills.  No after hours  refills.  Remember:  Business hours are:  Monday to Thursday 8:00 AM to 4:00 PM Provider's Schedule: Crystal King, NP - Appointments are:  Medication management: Monday to Thursday 8:00 AM to 4:00 PM Oliver Heitzenrater, MD - Appointments are:  Medication management: Monday and Wednesday 8:00 AM to 4:00 PM Procedure day: Tuesday and Thursday 7:30 AM to 4:00 PM Bilal Lateef, MD - Appointments are:  Medication management: Tuesday and Thursday 8:00 AM to 4:00 PM Procedure day: Monday and Wednesday 7:30 AM to 4:00 PM (Last update: 04/06/2017) ____________________________________________________________________________________________   ____________________________________________________________________________________________  CANNABIDIOL (AKA: CBD Oil or Pills)  Applies to: All patients receiving prescriptions of controlled substances (written and/or electronic).  General Information: Cannabidiol (CBD) was discovered in 1940. It is one of some 113 identified cannabinoids in cannabis (Marijuana) plants, accounting for up to 40% of the plant's extract. As of 2018, preliminary clinical research on cannabidiol included studies of anxiety, cognition, movement disorders, and pain.  Cannabidiol is consummed in multiple ways, including inhalation of cannabis smoke or vapor, as an aerosol spray into the cheek, and by mouth. It may be supplied as CBD oil containing CBD as the active ingredient (no added tetrahydrocannabinol (THC) or terpenes), a full-plant CBD-dominant hemp extract oil, capsules, dried cannabis, or as a liquid solution. CBD is thought not have the same psychoactivity as THC, and may affect the actions of THC. Studies suggest that CBD may interact with different biological targets, including cannabinoid receptors and other neurotransmitter receptors. As of 2018 the mechanism of action for its biological effects has not been determined.  In the [place], cannabidiol has a limited  approval by the Food and Drug Administration (FDA) for treatment of only two types   of epilepsy disorders. The side effects of long-term use of the drug include somnolence, decreased appetite, diarrhea, fatigue, malaise, weakness, sleeping problems, and others.  CBD remains a Schedule I drug prohibited for any use.  Legality: Some manufacturers ship CBD products nationally, an illegal action which the FDA has not enforced in 2018, with CBD remaining the subject of an FDA investigational new drug evaluation, and is not considered legal as a dietary supplement or food ingredient as of December 2018. Federal illegality has made it difficult historically to conduct research on CBD. CBD is openly sold in head shops and health food stores in some states where such sales have not been explicitly legalized.  Warning: Because it is not FDA approved for general use or treatment of pain, it is not required to undergo the same manufacturing controls as prescription drugs.  This means that the available cannabidiol (CBD) may be contaminated with THC.  If this is the case, it will trigger a positive urine drug screen (UDS) test for cannabinoids (Marijuana).  Because a positive UDS for illicit substances is a violation of our medication agreement, your opioid analgesics (pain medicine) may be permanently discontinued. (Last update: 04/27/2017) ____________________________________________________________________________________________    

## 2018-07-10 ENCOUNTER — Encounter: Payer: Self-pay | Admitting: Internal Medicine

## 2018-07-10 ENCOUNTER — Other Ambulatory Visit: Payer: Self-pay

## 2018-07-10 ENCOUNTER — Inpatient Hospital Stay: Payer: 59 | Attending: Internal Medicine | Admitting: Internal Medicine

## 2018-07-10 ENCOUNTER — Encounter: Payer: 59 | Admitting: Nurse Practitioner

## 2018-07-10 VITALS — BP 90/61 | HR 88 | Temp 98.9°F | Wt 185.3 lb

## 2018-07-10 DIAGNOSIS — Z7982 Long term (current) use of aspirin: Secondary | ICD-10-CM | POA: Insufficient documentation

## 2018-07-10 DIAGNOSIS — F329 Major depressive disorder, single episode, unspecified: Secondary | ICD-10-CM | POA: Diagnosis not present

## 2018-07-10 DIAGNOSIS — F17210 Nicotine dependence, cigarettes, uncomplicated: Secondary | ICD-10-CM | POA: Diagnosis not present

## 2018-07-10 DIAGNOSIS — M2550 Pain in unspecified joint: Secondary | ICD-10-CM | POA: Diagnosis not present

## 2018-07-10 DIAGNOSIS — Z79811 Long term (current) use of aromatase inhibitors: Secondary | ICD-10-CM | POA: Insufficient documentation

## 2018-07-10 DIAGNOSIS — Z9221 Personal history of antineoplastic chemotherapy: Secondary | ICD-10-CM | POA: Insufficient documentation

## 2018-07-10 DIAGNOSIS — R502 Drug induced fever: Secondary | ICD-10-CM | POA: Insufficient documentation

## 2018-07-10 DIAGNOSIS — Z79899 Other long term (current) drug therapy: Secondary | ICD-10-CM | POA: Insufficient documentation

## 2018-07-10 DIAGNOSIS — Z7984 Long term (current) use of oral hypoglycemic drugs: Secondary | ICD-10-CM | POA: Insufficient documentation

## 2018-07-10 DIAGNOSIS — C50512 Malignant neoplasm of lower-outer quadrant of left female breast: Secondary | ICD-10-CM | POA: Diagnosis not present

## 2018-07-10 DIAGNOSIS — E1140 Type 2 diabetes mellitus with diabetic neuropathy, unspecified: Secondary | ICD-10-CM | POA: Insufficient documentation

## 2018-07-10 DIAGNOSIS — Z803 Family history of malignant neoplasm of breast: Secondary | ICD-10-CM

## 2018-07-10 DIAGNOSIS — Z794 Long term (current) use of insulin: Secondary | ICD-10-CM | POA: Insufficient documentation

## 2018-07-10 DIAGNOSIS — Z170 Estrogen receptor positive status [ER+]: Secondary | ICD-10-CM | POA: Insufficient documentation

## 2018-07-10 DIAGNOSIS — R0781 Pleurodynia: Secondary | ICD-10-CM | POA: Diagnosis not present

## 2018-07-10 DIAGNOSIS — R5383 Other fatigue: Secondary | ICD-10-CM

## 2018-07-10 NOTE — Assessment & Plan Note (Addendum)
#   breast cancer Stage III ER/PR pos her 2 NEG. currently on anastrazole+ Zoldadex.  Stable.  Tolerating fairly well except for continued joint pains.  Discussed the role of adjuvant Zometa.  Patient is interested.  Discussed the potential side effects including but not limited to osteonecrosis of the jaw/hypocalcemia.  # Joint pains- on vit D/ recommend osteobifelx.  Also Neurontin.  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  S stable  # DM-2 on insulin-s stable  # DISPOSITION: # bmp/zometa in 1 week.  #Follow-up in 6 weeks-MD-no labs- Dr.B

## 2018-07-10 NOTE — Progress Notes (Signed)
[facility] CONSULT NOTE  Patient Care Team: Patient, No Pcp Per as PCP - General (General Practice) [name], [name], MD (General Surgery) [name], [name], [name] (Obstetrics and Gynecology)  CHIEF COMPLAINTS/PURPOSE OF CONSULTATION:  Breast cancer  #  Oncology History   # MAY 2019-  clinical stage IIIA (T3N1Mx) left breast cancer s/p biopsy on 06/14/2017. -Pathology revealed grade III invasive ductal carcinoma. -Axillary FNA revealed malignant cells c/w metastatic carcinoma. Tumor was ER + (90%), PR + (30%), Her2/neu - and Ki67 70%.  CA27.29 was 7.8 on 06/14/2017.  # She received 4 cycles of AC with Neulasta support (07/20/2017 - 08/31/2017).;  neoadjuvant Taxol on 09/14/2017.  #DEC 2019- Lumpectomy/sentinel lymph node biopsy [Dr.[name]]-complete pathologic response  # s/p RT [delayed sec to wound infection; Dr.[name]] finished RT [4/12]  # April 14th 2020- START TAM; stopped in mid-May secondary intolerance [severe migraines].  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 Secord/Berchuck]-negative for malignancy/ # DM-2- poorly controlled.   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  -------------------------------------------  DIAGNOSIS: left breast cancer  STAGE:  III       ;GOALS: cure  CURRENT/MOST RECENT THERAPY Tam      Cancer of midline of breast, left ([place])   06/15/2017 Initial Diagnosis    Cancer of midline of breast, left ([place])    06/26/2017 -  Chemotherapy    The patient had DOXOrubicin (ADRIAMYCIN) chemo injection 122 mg, 60 mg/m2 = 122 mg, Intravenous,  Once, 4 of 4 cycles Administration: 122 mg (07/20/2017), 122 mg (08/03/2017), 122 mg (08/17/2017), 122 mg (08/31/2017) palonosetron (ALOXI) injection 0.25 mg, 0.25  mg, Intravenous,  Once, 4 of 4 cycles Administration: 0.25 mg (07/20/2017), 0.25 mg (08/03/2017), 0.25 mg (08/17/2017), 0.25 mg (08/31/2017) pegfilgrastim (NEULASTA) injection 6 mg, 6 mg, Subcutaneous, Once, 5 of 5 cycles Administration: 6 mg (07/21/2017), 6 mg (08/04/2017), 6 mg (08/18/2017), 6 mg (09/01/2017) cyclophosphamide (CYTOXAN) 1,220 mg in sodium chloride 0.9 % 250 mL chemo infusion, 600 mg/m2 = 1,220 mg, Intravenous,  Once, 4 of 4 cycles Administration: 1,220 mg (07/20/2017), 1,220 mg (08/03/2017), 1,220 mg (08/17/2017), 1,220 mg (08/31/2017) PACLitaxel (TAXOL) 162 mg in sodium chloride 0.9 % 250 mL chemo infusion (</= 59m/m2), 80 mg/m2 = 162 mg, Intravenous,  Once, 10 of 12 cycles Dose modification: 65 mg/m2 (original dose 80 mg/m2, Cycle 13, Reason: Provider Judgment, Comment: neuropathy) Administration: 162 mg (09/14/2017), 162 mg (09/21/2017), 162 mg (09/28/2017), 162 mg (10/05/2017), 162 mg (10/12/2017), 162 mg (10/19/2017), 162 mg (10/26/2017), 132 mg (11/02/2017), 132 mg (11/09/2017), 132 mg (11/16/2017) fosaprepitant (EMEND) 150 mg, dexamethasone (DECADRON) 12 mg in sodium chloride 0.9 % 145 mL IVPB, , Intravenous,  Once, 4 of 4 cycles Administration:  (07/20/2017),  (08/03/2017),  (08/17/2017),  (08/31/2017)  for chemotherapy treatment.      Malignant neoplasm of lower-outer quadrant of left breast of female, estrogen receptor positive ([facility]   11/08/2017 Initial Diagnosis    Malignant neoplasm of lower-outer quadrant of left breast of female, estrogen receptor positive ([facility]      HISTORY OF PRESENTING ILLNESS:  CErtha NaborWhite 53y.o.  female with a history of stage III ER PR positive HER-2/neu negative breast cancer is here for follow-up.  Last visit, approximately 3 weeks ago patient was taken off tamoxifen-because of poor tolerance.  Patient is  currently on anastrozole/Zoladex.  She continues to have joint pains tingling and numbness extremities.  She is on vitamin D/Neurontin. Review of  Systems  Constitutional: Positive for malaise/fatigue. Negative for chills, diaphoresis, fever and weight loss.  HENT: Negative for nosebleeds and sore throat.   Eyes: Negative for double vision.  Respiratory: Negative for cough, hemoptysis, sputum production, shortness of breath and wheezing.   Cardiovascular: Negative for chest pain, palpitations, orthopnea and leg swelling.  Gastrointestinal: Negative for abdominal pain, blood in stool, constipation, diarrhea, heartburn, melena, nausea and vomiting.  Genitourinary: Negative for dysuria, frequency and urgency.  Musculoskeletal: Positive for joint pain. Negative for back pain.  Skin: Negative.  Negative for itching and rash.  Neurological: Positive for tingling. Negative for dizziness, focal weakness, weakness and headaches.  Endo/Heme/Allergies: Does not bruise/bleed easily.  Psychiatric/Behavioral: Negative for depression. The patient is not nervous/anxious and does not have insomnia.      MEDICAL HISTORY:  Past Medical History:  Diagnosis Date  . Cancer ([place]) 06/15/2017   5.1 cm, T3,N1 (clinical): ER/ PR positive, Her 2 neu not overexpressed, High Ki 67. Neuoadjuvant chemotherapy.   . Depression   . Diabetes mellitus without complication ([employer]) 2355  . Endometriosis   . Family history of breast cancer   . Headache    migraines  . Hyperlipidemia   . Ovarian mass   . Pneumonia    2018    SURGICAL HISTORY: Past Surgical History:  Procedure Laterality Date  . AXILLARY LYMPH NODE BIOPSY Left 07/14/2017   Procedure: INSERTION GEL MARK CLIP LEFT AXILLA;  Surgeon: Robert Bellow, MD;  Location: [facility] ORS;  Service: General;  Laterality: Left;  . BREAST BIOPSY Left    Dr Orlene Och BREAST METASTATIC CARCINOMA  . BREAST LUMPECTOMY Left 01/12/2018  . OOPHORECTOMY    . PARTIAL MASTECTOMY WITH NEEDLE LOCALIZATION Left 01/12/2018   Procedure: PARTIAL MASTECTOMY WITH NEEDLE LOCALIZATION;  Surgeon: Robert Bellow, MD;  Location:  [facility] ORS;  Service: General;  Laterality: Left;  . PORTACATH PLACEMENT Right 07/14/2017   Procedure: INSERTION PORT-A-CATH;  Surgeon: Robert Bellow, MD;  Location: [facility] ORS;  Service: General;  Laterality: Right;  . SENTINEL NODE BIOPSY Left 01/12/2018   Procedure: SENTINEL NODE BIOPSY;  Surgeon: Robert Bellow, MD;  Location: [facility] ORS;  Service: General;  Laterality: Left;  . TUBAL LIGATION      SOCIAL HISTORY: Social History   Socioeconomic History  . Marital status: Single    Spouse name: Not on file  . Number of children: Not on file  . Years of education: Not on file  . Highest education level: Not on file  Occupational History  . Not on file  Social Needs  . Financial resource strain: Not on file  . Food insecurity:    Worry: Not on file    Inability: Not on file  . Transportation needs:    Medical: Not on file    Non-medical: Not on file  Tobacco Use  . Smoking status: Current Every Day Smoker    Packs/day: 0.50    Years: 11.00    Pack years: 5.50    Types: Cigarettes  . Smokeless tobacco: Former Systems developer    Types: Snuff  Substance and Sexual Activity  . Alcohol use: No    Alcohol/week: 0.0 standard drinks  . Drug use: No  . Sexual activity: Yes  Lifestyle  . Physical activity:    Days per week: Not on file    Minutes per session: Not  on file  . Stress: Not on file  Relationships  . Social connections:    Talks on phone: Not on file    Gets together: Not on file    Attends religious service: Not on file    Active member of club or organization: Not on file    Attends meetings of clubs or organizations: Not on file    Relationship status: Not on file  . Intimate partner violence:    Fear of current or ex partner: Not on file    Emotionally abused: Not on file    Physically abused: Not on file    Forced sexual activity: Not on file  Other Topics Concern  . Not on file  Social History Narrative  . Not on file    FAMILY HISTORY: Family History   Problem Relation Age of Onset  . Other Father        No info about father or paternal relatives  . Diabetes Brother   . Pancreatitis Brother   . Prostate cancer Brother 51       currently 71 / maternal half-brother  . Breast cancer Maternal Grandmother 40       deceased 7s  . Breast cancer Maternal Aunt 65       currently 14  . Breast cancer Other 34       mother's sister; deceased 30  . Breast cancer Other        mother's sister; age at dx unknown    ALLERGIES:  is allergic to aspirin.  MEDICATIONS:  Current Outpatient Medications  Medication Sig Dispense Refill  . albuterol (PROVENTIL HFA;VENTOLIN HFA) 108 (90 Base) MCG/ACT inhaler Inhale 2 puffs into the lungs every 6 (six) hours as needed for wheezing or shortness of breath. 1 Inhaler 0  . anastrozole (ARIMIDEX) 1 MG tablet Take 1 tablet (1 mg total) by mouth daily. 30 tablet 3  . aspirin-acetaminophen-caffeine (EXCEDRIN MIGRAINE) 250-250-65 MG tablet Take 2 tablets by mouth daily as needed for headache.    . Blood Glucose Monitoring Suppl (FIFTY50 GLUCOSE METER 2.0) w/Device KIT Use as directed    . canagliflozin (INVOKANA) 300 MG TABS tablet Take 300 mg by mouth daily.    . Cholecalciferol (VITAMIN D3) 125 MCG (5000 UT) CAPS Take 1 capsule (5,000 Units total) by mouth daily with breakfast. Take along with calcium and magnesium. 100 capsule 0  . gabapentin (NEURONTIN) 300 MG capsule Take 3 capsules (900 mg total) by mouth 4 (four) times daily. 1200 capsule 0  . glucose blood (ONE TOUCH ULTRA TEST) test strip Use up to 4 times/day 100 each 12  . goserelin (ZOLADEX) 3.6 MG injection Inject 3.6 mg into the skin every 28 (twenty-eight) days.    . Insulin Glargine (BASAGLAR KWIKPEN) 100 UNIT/ML SOPN INJECT 0.24 MLS (24 UNITS TOTAL) INTO THE SKIN AT BEDTIME. (Patient taking differently: Inject 44 Units into the skin at bedtime. ) 15 pen 2  . Insulin Pen Needle 32G X 4 MM MISC 1 Units by Does not apply route every morning. Pen  needles 90 each 3  . lidocaine-prilocaine (EMLA) cream Apply to affected area once (Patient taking differently: Apply 1 application topically daily as needed (port access). Ap) 30 g 3  . Magnesium 500 MG CAPS Take 1 capsule (500 mg total) by mouth 2 (two) times daily at 8 am and 10 pm. 200 capsule 0  . metFORMIN (GLUCOPHAGE) 500 MG tablet Take 1,000 mg by mouth 2 (two) times daily with a meal.    .  ondansetron (ZOFRAN) 8 MG tablet Take 1 tablet (8 mg total) by mouth 2 (two) times daily as needed. Start on the third day after chemotherapy. 30 tablet 1  . [START ON 09/17/2018] oxyCODONE (OXY IR/ROXICODONE) 5 MG immediate release tablet Take 1-2 tablets (5-10 mg total) by mouth every 6 (six) hours as needed for up to 30 days for severe pain. Must last 30 days. 60 tablet 0  . [START ON 08/18/2018] oxyCODONE (OXY IR/ROXICODONE) 5 MG immediate release tablet Take 1 tablet (5 mg total) by mouth every 4 (four) hours as needed for up to 30 days for severe pain. Must last 30 days 60 tablet 0  . [START ON 07/19/2018] oxyCODONE (OXY IR/ROXICODONE) 5 MG immediate release tablet Take 1-2 tablets (5-10 mg total) by mouth every 6 (six) hours as needed for up to 30 days for severe pain. Must last 30 days 60 tablet 0  . prochlorperazine (COMPAZINE) 10 MG tablet Take 1 tablet (10 mg total) by mouth every 6 (six) hours as needed (Nausea or vomiting). 30 tablet 1   No current facility-administered medications for this visit.    Facility-Administered Medications Ordered in Other Visits  Medication Dose Route Frequency Provider Last Rate Last Dose  . heparin lock flush 100 unit/mL  500 Units Intravenous Once Corcoran, Melissa C, MD      . sodium chloride flush (NS) 0.9 % injection 10 mL  10 mL Intravenous Once Corcoran, Melissa C, MD          .  PHYSICAL EXAMINATION: ECOG PERFORMANCE STATUS: 0 - Asymptomatic  Vitals:   07/10/18 1412  BP: 90/61  Pulse: 88  Temp: 98.9 F (37.2 C)   Filed Weights   07/10/18 1412   Weight: 185 lb 5 oz (84.1 kg)    Physical Exam  Constitutional: She is oriented to person, place, and time and well-developed, well-nourished, and in no distress.  HENT:  Head: Normocephalic and atraumatic.  Mouth/Throat: Oropharynx is clear and moist. No oropharyngeal exudate.  Eyes: Pupils are equal, round, and reactive to light.  Neck: Normal range of motion. Neck supple.  Cardiovascular: Normal rate and regular rhythm.  Pulmonary/Chest: No respiratory distress. She has no wheezes.  Abdominal: Soft. Bowel sounds are normal. She exhibits no distension and no mass. There is no abdominal tenderness. There is no rebound and no guarding.  Musculoskeletal: Normal range of motion.        General: No tenderness or edema.  Neurological: She is alert and oriented to person, place, and time.  Skin: Skin is warm.  Psychiatric: Affect normal.     LABORATORY DATA:  I have reviewed the data as listed Lab Results  Component Value Date   WBC 6.2 05/22/2018   HGB 14.8 05/22/2018   HCT 45.8 05/22/2018   MCV 94.0 05/22/2018   PLT 227 05/22/2018   Recent Labs    11/23/17 1101 01/22/18 1200 02/02/18 0003 05/22/18 1146  NA 138 139 135 139  K 3.8 4.2 3.8 3.7  CL 102 97 102 104  CO2 25  --  25 26  GLUCOSE 226* 307* 337* 194*  BUN _0 CREATININE 0.53 0.51* 0.46 0.56  CALCIUM 9.6 9.7 9.1 9.5  GFRNONAA >60 112 >60 >60  GFRAA >60 130 >60 >60  PROT 6.3* 6.4 7.2 7.7  ALBUMIN 3.8 4.1 3.9 4.2  AST _1 ALT 27  --  14 18  ALKPHOS 78 103 89 94  BILITOT 0.9 0.4  0.5 0.6    RADIOGRAPHIC STUDIES: I have personally reviewed the radiological images as listed and agreed with the findings in the report. No results found.  ASSESSMENT & PLAN:   Malignant neoplasm of lower-outer quadrant of left breast of female, estrogen receptor positive ([place]) # breast cancer Stage III ER/PR pos her 2 NEG. currently on anastrazole+ Zoldadex.  Stable.  Tolerating fairly well except for continued  joint pains.  Discussed the role of adjuvant Zometa.  Patient is interested.  Discussed the potential side effects including but not limited to osteonecrosis of the jaw/hypocalcemia.  # Joint pains- on vit D/ recommend osteobifelx.  Also Neurontin.  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  S stable  # DM-2 on insulin-s stable  # DISPOSITION: # bmp/zometa in 1 week.  #Follow-up in 6 weeks-MD-no labs- Dr.B    All questions were answered. The patient knows to call the clinic with any problems, questions or concerns.    Cammie Sickle, MD 07/10/2018 2:31 PM

## 2018-07-10 NOTE — Progress Notes (Signed)
Patient here today for follow up.  Patient c/o some days where she feels depressed, sad and sleeps a lot, pt denies any any thoughts of harming herself or others.

## 2018-07-17 ENCOUNTER — Encounter: Payer: Self-pay | Admitting: Urology

## 2018-07-17 ENCOUNTER — Other Ambulatory Visit: Payer: Self-pay

## 2018-07-17 ENCOUNTER — Ambulatory Visit: Payer: 59 | Admitting: Urology

## 2018-07-17 VITALS — BP 96/67 | HR 96 | Ht 63.0 in | Wt 187.0 lb

## 2018-07-17 DIAGNOSIS — R350 Frequency of micturition: Secondary | ICD-10-CM

## 2018-07-17 DIAGNOSIS — R39198 Other difficulties with micturition: Secondary | ICD-10-CM

## 2018-07-17 LAB — URINALYSIS, COMPLETE
Bilirubin, UA: NEGATIVE
Ketones, UA: NEGATIVE
Leukocytes,UA: NEGATIVE
Nitrite, UA: NEGATIVE
Protein,UA: NEGATIVE
RBC, UA: NEGATIVE
Specific Gravity, UA: 1.015 (ref 1.005–1.030)
Urobilinogen, Ur: 0.2 mg/dL (ref 0.2–1.0)
pH, UA: 5.5 (ref 5.0–7.5)

## 2018-07-17 LAB — MICROSCOPIC EXAMINATION
Bacteria, UA: NONE SEEN
RBC, Urine: NONE SEEN /hpf (ref 0–2)

## 2018-07-17 NOTE — Progress Notes (Signed)
07/17/2018 4:54 PM   Carolyn Roy August 04, 1965 [id number]  Referring provider: Verlon Au, NP [place],  [place] [address]  Chief Complaint  Patient presents with  . Urinary Incontinence    HPI: 53 yo F referred for further evaluation of urinary issues.    She reports that after chemotherapy she felt like she was having pressure and difficulty emptying her bladder on occasion.  This seems to be improving with time.  She recalls mentioning this to her oncologist and thus was referred to urology for this.  She has a personal history of stage IIIa left breast cancer status post chemotherapy followed by lumpectomy and radiation.  She is currently on Arimidex.  She has urinary frequency and urgency which is worse when she drinks more water.  She has 4 isolated episodes of enuresis, twice before chemo and twice after.  No urgency incontinence.  Mild stress incontinence.    No issues with UTIs.  No gross hematuria or dysuria.  No personal history of urinary retention or need for Foley catheter.   PMH: Past Medical History:  Diagnosis Date  . Cancer ([place]) 06/15/2017   5.1 cm, T3,N1 (clinical): ER/ PR positive, Her 2 neu not overexpressed, High Ki 67. Neuoadjuvant chemotherapy.   . Depression   . Diabetes mellitus without complication ([place]) 8185  . Endometriosis   . Family history of breast cancer   . Headache    migraines  . Hyperlipidemia   . Ovarian mass   . Pneumonia    2018    Surgical History: Past Surgical History:  Procedure Laterality Date  . AXILLARY LYMPH NODE BIOPSY Left 07/14/2017   Procedure: INSERTION GEL MARK CLIP LEFT AXILLA;  Surgeon: Robert Bellow, MD;  Location: [facility] ORS;  Service: General;  Laterality: Left;  . BREAST BIOPSY Left    Dr Orlene Och BREAST METASTATIC CARCINOMA  . BREAST LUMPECTOMY Left 01/12/2018  . OOPHORECTOMY    . PARTIAL MASTECTOMY WITH NEEDLE LOCALIZATION Left 01/12/2018   Procedure: PARTIAL MASTECTOMY  WITH NEEDLE LOCALIZATION;  Surgeon: Robert Bellow, MD;  Location: [facility] ORS;  Service: General;  Laterality: Left;  . PORTACATH PLACEMENT Right 07/14/2017   Procedure: INSERTION PORT-A-CATH;  Surgeon: Robert Bellow, MD;  Location: [facility]S;  Service: General;  Laterality: Right;  . SENTINEL NODE BIOPSY Left 01/12/2018   Procedure: SENTINEL NODE BIOPSY;  Surgeon: Robert Bellow, MD;  Location: [facility];  Service: General;  Laterality: Left;  . TUBAL LIGATION      Home Medications:  Allergies as of 07/17/2018      Reactions   Aspirin Nausea And Vomiting      Medication List       Accurate as of July 17, 2018 11:59 PM. If you have any questions, ask your nurse or doctor.        albuterol 108 (90 Base) MCG/ACT inhaler Commonly known as: VENTOLIN HFA Inhale 2 puffs into the lungs every 6 (six) hours as needed for wheezing or shortness of breath.   anastrozole 1 MG tablet Commonly known as: ARIMIDEX Take 1 tablet (1 mg total) by mouth daily.   aspirin-acetaminophen-caffeine 250-250-65 MG tablet Commonly known as: EXCEDRIN MIGRAINE Take 2 tablets by mouth daily as needed for headache.   Basaglar KwikPen 100 UNIT/ML Sopn INJECT 0.24 MLS (24 UNITS TOTAL) INTO THE SKIN AT BEDTIME. What changed: how much to take   Fifty50 Glucose Meter 2.0 w/Device Kit Use as directed   gabapentin 300 MG  capsule Commonly known as: NEURONTIN Take 3 capsules (900 mg total) by mouth 4 (four) times daily.   glucose blood test strip Commonly known as: ONE TOUCH ULTRA TEST Use up to 4 times/day   goserelin 3.6 MG injection Commonly known as: ZOLADEX Inject 3.6 mg into the skin every 28 (twenty-eight) days.   Insulin Pen Needle 32G X 4 MM Misc 1 Units by Does not apply route every morning. Pen needles   Invokana 300 MG Tabs tablet Generic drug: canagliflozin Take 300 mg by mouth daily.   lidocaine-prilocaine cream Commonly known as: EMLA Apply to affected area once What changed:    how much to take  how to take this  when to take this  reasons to take this  additional instructions   Magnesium 500 MG Caps Take 1 capsule (500 mg total) by mouth 2 (two) times daily at 8 am and 10 pm.   metFORMIN 500 MG tablet Commonly known as: GLUCOPHAGE Take 1,000 mg by mouth 2 (two) times daily with a meal.   ondansetron 8 MG tablet Commonly known as: Zofran Take 1 tablet (8 mg total) by mouth 2 (two) times daily as needed. Start on the third day after chemotherapy.   oxyCODONE 5 MG immediate release tablet Commonly known as: Oxy IR/ROXICODONE Take 1-2 tablets (5-10 mg total) by mouth every 6 (six) hours as needed for up to 30 days for severe pain. Must last 30 days What changed: Another medication with the same name was removed. Continue taking this medication, and follow the directions you see here. Changed by: Hollice Espy, MD   prochlorperazine 10 MG tablet Commonly known as: COMPAZINE Take 1 tablet (10 mg total) by mouth every 6 (six) hours as needed (Nausea or vomiting).   Vitamin D3 125 MCG (5000 UT) Caps Take 1 capsule (5,000 Units total) by mouth daily with breakfast. Take along with calcium and magnesium.       Allergies:  Allergies  Allergen Reactions  . Aspirin Nausea And Vomiting    Family History: Family History  Problem Relation Age of Onset  . Other Father        No info about father or paternal relatives  . Diabetes Brother   . Pancreatitis Brother   . Prostate cancer Brother 67       currently 69 / maternal half-brother  . Breast cancer Maternal Grandmother 40       deceased 92s  . Breast cancer Maternal Aunt 65       currently 29  . Breast cancer Other 84       mother's sister; deceased 31  . Breast cancer Other        mother's sister; age at dx unknown    Social History:  reports that she has been smoking cigarettes. She has a 5.50 pack-year smoking history. She has quit using smokeless tobacco.  Her smokeless tobacco use  included snuff. She reports that she does not drink alcohol or use drugs.  ROS: UROLOGY Frequent Urination?: Yes Hard to postpone urination?: Yes Burning/pain with urination?: No Get up at night to urinate?: No Leakage of urine?: No Urine stream starts and stops?: No Trouble starting stream?: No Do you have to strain to urinate?: No Blood in urine?: No Urinary tract infection?: No Sexually transmitted disease?: No Injury to kidneys or bladder?: No Painful intercourse?: No Weak stream?: No Currently pregnant?: No Vaginal bleeding?: No Last menstrual period?: n  Gastrointestinal Nausea?: No Vomiting?: No Indigestion/heartburn?: No Diarrhea?: No  Constipation?: No  Constitutional Fever: No Night sweats?: No Weight loss?: No Fatigue?: No  Skin Skin rash/lesions?: No Itching?: No  Eyes Blurred vision?: No Double vision?: No  Ears/Nose/Throat Sore throat?: No Sinus problems?: No  Hematologic/Lymphatic Swollen glands?: No Easy bruising?: No  Cardiovascular Leg swelling?: No Chest pain?: No  Respiratory Cough?: No Shortness of breath?: No  Endocrine Excessive thirst?: No  Musculoskeletal Back pain?: No Joint pain?: No  Neurological Headaches?: No Dizziness?: No  Psychologic Depression?: No Anxiety?: No  Physical Exam: BP 96/67   Pulse 96   Ht '5\' 3"'  (1.6 m)   Wt 187 lb (84.8 kg)   BMI 33.13 kg/m   Constitutional:  Alert and oriented, No acute distress. HEENT: [place] AT, moist mucus membranes.  Trachea midline, no masses. Cardiovascular: No clubbing, cyanosis, or edema. Respiratory: Normal respiratory effort, no increased work of breathing. GI: Abdomen is soft, nontender, nondistended, no abdominal masses.  Obese. Lymph: No cervical or inguinal lymphadenopathy. Skin: No rashes, bruises or suspicious lesions. Neurologic: Grossly intact, no focal deficits, moving all 4 extremities. Psychiatric: Normal mood and affect.  Laboratory Data: Lab  Results  Component Value Date   WBC 6.2 05/22/2018   HGB 14.8 05/22/2018   HCT 45.8 05/22/2018   MCV 94.0 05/22/2018   PLT 227 05/22/2018    Lab Results  Component Value Date   CREATININE 0.61 07/18/2018    Lab Results  Component Value Date   HGBA1C 8.8 (H) 06/02/2017    Urinalysis Results for orders placed or performed in visit on 07/17/18  Microscopic Examination   URINE  Result Value Ref Range   WBC, UA 0-5 0 - 5 /hpf   RBC None seen 0 - 2 /hpf   Epithelial Cells (non renal) 0-10 0 - 10 /hpf   Bacteria, UA None seen None seen/Few  Urinalysis, Complete  Result Value Ref Range   Specific Gravity, UA 1.015 1.005 - 1.030   pH, UA 5.5 5.0 - 7.5   Color, UA Yellow Yellow   Appearance Ur Clear Clear   Leukocytes,UA Negative Negative   Protein,UA Negative Negative/Trace   Glucose, UA 3+ (A) Negative   Ketones, UA Negative Negative   RBC, UA Negative Negative   Bilirubin, UA Negative Negative   Urobilinogen, Ur 0.2 0.2 - 1.0 mg/dL   Nitrite, UA Negative Negative   Microscopic Examination See below:      Pertinent Imaging: PVR 0  Assessment & Plan:    1. Difficulty urinating Adequate bladder emptying today noted on bladder scan, PVR 0 Overall, her symptoms are improving and she has minimal bother from this at this time UA unremarkable - Bladder Scan (Post Void Residual) in office - Urinalysis, Complete  2. Urinary frequency Occasional urinary frequency, related to fluid intake Overall minimal bother No interested in pharmacotherapy  She will follow-up with [place] as needed, currently her symptoms are improving  Hollice Espy, MD  [facility] [address] [place], [place] [address] [phone]

## 2018-07-18 ENCOUNTER — Inpatient Hospital Stay: Payer: 59

## 2018-07-18 ENCOUNTER — Other Ambulatory Visit: Payer: Self-pay

## 2018-07-18 ENCOUNTER — Other Ambulatory Visit: Payer: Self-pay | Admitting: Pain Medicine

## 2018-07-18 VITALS — BP 100/65 | HR 92 | Temp 98.3°F | Resp 18

## 2018-07-18 DIAGNOSIS — C50512 Malignant neoplasm of lower-outer quadrant of left female breast: Secondary | ICD-10-CM

## 2018-07-18 DIAGNOSIS — R0781 Pleurodynia: Secondary | ICD-10-CM | POA: Diagnosis not present

## 2018-07-18 DIAGNOSIS — Z95828 Presence of other vascular implants and grafts: Secondary | ICD-10-CM

## 2018-07-18 LAB — BASIC METABOLIC PANEL
Anion gap: 9 (ref 5–15)
BUN: 8 mg/dL (ref 6–20)
CO2: 25 mmol/L (ref 22–32)
Calcium: 8.3 mg/dL — ABNORMAL LOW (ref 8.9–10.3)
Chloride: 104 mmol/L (ref 98–111)
Creatinine, Ser: 0.61 mg/dL (ref 0.44–1.00)
GFR calc Af Amer: >60 mL/min (ref >60)
GFR calc non Af Amer: >60 mL/min (ref >60)
Glucose, Bld: 312 mg/dL — ABNORMAL HIGH (ref 70–99)
Potassium: 3.7 mmol/L (ref 3.5–5.1)
Sodium: 138 mmol/L (ref 135–145)

## 2018-07-18 MED ORDER — HEPARIN SOD (PORK) LOCK FLUSH 100 UNIT/ML IV SOLN
500.0000 [IU] | Freq: Once | INTRAVENOUS | Status: AC
  Administered 2018-07-18: 500 [IU] via INTRAVENOUS
  Filled 2018-07-18: qty 5

## 2018-07-18 MED ORDER — SODIUM CHLORIDE 0.9% FLUSH
10.0000 mL | Freq: Once | INTRAVENOUS | Status: AC
  Administered 2018-07-18: 10 mL via INTRAVENOUS
  Filled 2018-07-18: qty 10

## 2018-07-18 MED ORDER — ZOLEDRONIC ACID 4 MG/5ML IV CONC: 4.0000 mg | Freq: Once | INTRAVENOUS | Status: DC

## 2018-07-18 MED ORDER — ZOLEDRONIC ACID 4 MG/100ML IV SOLN
4.0000 mg | Freq: Once | INTRAVENOUS | Status: AC
  Administered 2018-07-18: 4 mg via INTRAVENOUS
  Filled 2018-07-18: qty 100

## 2018-07-18 MED ORDER — SODIUM CHLORIDE 0.9 % IV SOLN
Freq: Once | INTRAVENOUS | Status: AC
  Administered 2018-07-18: 12:00:00 via INTRAVENOUS
  Filled 2018-07-18: qty 250

## 2018-07-19 ENCOUNTER — Other Ambulatory Visit: Payer: Self-pay

## 2018-07-19 ENCOUNTER — Inpatient Hospital Stay (HOSPITAL_BASED_OUTPATIENT_CLINIC_OR_DEPARTMENT_OTHER): Payer: 59 | Admitting: Hospice and Palliative Medicine

## 2018-07-19 ENCOUNTER — Encounter: Payer: Self-pay | Admitting: Hospice and Palliative Medicine

## 2018-07-19 ENCOUNTER — Telehealth: Payer: Self-pay | Admitting: *Deleted

## 2018-07-19 VITALS — BP 106/66 | HR 108 | Temp 99.1°F | Resp 22 | Ht 63.0 in | Wt 189.4 lb

## 2018-07-19 DIAGNOSIS — C50512 Malignant neoplasm of lower-outer quadrant of left female breast: Secondary | ICD-10-CM

## 2018-07-19 DIAGNOSIS — R502 Drug induced fever: Secondary | ICD-10-CM | POA: Diagnosis not present

## 2018-07-19 DIAGNOSIS — M2550 Pain in unspecified joint: Secondary | ICD-10-CM

## 2018-07-19 DIAGNOSIS — Z79811 Long term (current) use of aromatase inhibitors: Secondary | ICD-10-CM

## 2018-07-19 DIAGNOSIS — R6889 Other general symptoms and signs: Secondary | ICD-10-CM

## 2018-07-19 DIAGNOSIS — Z794 Long term (current) use of insulin: Secondary | ICD-10-CM

## 2018-07-19 DIAGNOSIS — Z9221 Personal history of antineoplastic chemotherapy: Secondary | ICD-10-CM

## 2018-07-19 DIAGNOSIS — F17210 Nicotine dependence, cigarettes, uncomplicated: Secondary | ICD-10-CM

## 2018-07-19 DIAGNOSIS — Z7982 Long term (current) use of aspirin: Secondary | ICD-10-CM

## 2018-07-19 DIAGNOSIS — Z170 Estrogen receptor positive status [ER+]: Secondary | ICD-10-CM

## 2018-07-19 DIAGNOSIS — Z803 Family history of malignant neoplasm of breast: Secondary | ICD-10-CM

## 2018-07-19 DIAGNOSIS — R0781 Pleurodynia: Secondary | ICD-10-CM

## 2018-07-19 DIAGNOSIS — E1140 Type 2 diabetes mellitus with diabetic neuropathy, unspecified: Secondary | ICD-10-CM

## 2018-07-19 DIAGNOSIS — Z79899 Other long term (current) drug therapy: Secondary | ICD-10-CM

## 2018-07-19 DIAGNOSIS — R5383 Other fatigue: Secondary | ICD-10-CM

## 2018-07-19 DIAGNOSIS — Z7984 Long term (current) use of oral hypoglycemic drugs: Secondary | ICD-10-CM

## 2018-07-19 DIAGNOSIS — F329 Major depressive disorder, single episode, unspecified: Secondary | ICD-10-CM

## 2018-07-19 NOTE — Progress Notes (Signed)
[facility] [place]  [phone] [phone]  Patient Care Team: Patient, No Pcp Per as PCP - General (General Practice) [name], [name], MD (General Surgery) [name], [name], [name] (Obstetrics and Gynecology)   Name of the patient: Carolyn Roy  [id number]  1965-07-10   Date of visit: 07/19/18  Diagnosis-stage III breast cancer  Chief complaint/ Reason for visit- pleuritic pain, fevers  Heme/Onc history:  Oncology History Overview Note  # MAY 2019-  clinical stage IIIA (T3N1Mx) left breast cancer s/p biopsy on 06/14/2017. -Pathology revealed grade III invasive ductal carcinoma. -Axillary FNA revealed malignant cells c/w metastatic carcinoma. Tumor was ER + (90%), PR + (30%), Her2/neu - and Ki67 70%.  CA27.29 was 7.8 on 06/14/2017.  # She received 4 cycles of AC with Neulasta support (07/20/2017 - 08/31/2017).;  neoadjuvant Taxol on 09/14/2017.  #DEC 2019- Lumpectomy/sentinel lymph node biopsy [Dr.[name]]-complete pathologic response  # s/p RT [delayed sec to wound infection; Dr.[name]] finished RT [4/12]  # April 14th 2020- START TAM; stopped in mid-May secondary intolerance [severe migraines].  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 Secord/Berchuck]-negative for malignancy/ # DM-2- poorly controlled.   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  -------------------------------------------  DIAGNOSIS: left breast cancer  STAGE:  III       ;GOALS: cure  CURRENT/MOST RECENT THERAPY Tam    Cancer of midline of breast, left ([place])  06/15/2017 Initial Diagnosis   Cancer of midline of breast, left ([place])   06/26/2017 -  Chemotherapy   The patient had DOXOrubicin (ADRIAMYCIN)  chemo injection 122 mg, 60 mg/m2 = 122 mg, Intravenous,  Once, 4 of 4 cycles Administration: 122 mg (07/20/2017), 122 mg (08/03/2017), 122 mg (08/17/2017), 122 mg (08/31/2017) palonosetron (ALOXI) injection 0.25 mg, 0.25 mg, Intravenous,  Once, 4 of 4 cycles Administration: 0.25 mg (07/20/2017), 0.25 mg (08/03/2017), 0.25 mg (08/17/2017), 0.25 mg (08/31/2017) pegfilgrastim (NEULASTA) injection 6 mg, 6 mg, Subcutaneous, Once, 5 of 5 cycles Administration: 6 mg (07/21/2017), 6 mg (08/04/2017), 6 mg (08/18/2017), 6 mg (09/01/2017) cyclophosphamide (CYTOXAN) 1,220 mg in sodium chloride 0.9 % 250 mL chemo infusion, 600 mg/m2 = 1,220 mg, Intravenous,  Once, 4 of 4 cycles Administration: 1,220 mg (07/20/2017), 1,220 mg (08/03/2017), 1,220 mg (08/17/2017), 1,220 mg (08/31/2017) PACLitaxel (TAXOL) 162 mg in sodium chloride 0.9 % 250 mL chemo infusion (</= 22m/m2), 80 mg/m2 = 162 mg, Intravenous,  Once, 10 of 12 cycles Dose modification: 65 mg/m2 (original dose 80 mg/m2, Cycle 13, Reason: Provider Judgment, Comment: neuropathy) Administration: 162 mg (09/14/2017), 162 mg (09/21/2017), 162 mg (09/28/2017), 162 mg (10/05/2017), 162 mg (10/12/2017), 162 mg (10/19/2017), 162 mg (10/26/2017), 132 mg (11/02/2017), 132 mg (11/09/2017), 132 mg (11/16/2017) fosaprepitant (EMEND) 150 mg, dexamethasone (DECADRON) 12 mg in sodium chloride 0.9 % 145 mL IVPB, , Intravenous,  Once, 4 of 4 cycles Administration:  (07/20/2017),  (08/03/2017),  (08/17/2017),  (08/31/2017)  for chemotherapy treatment.    Malignant neoplasm of lower-outer quadrant of left breast of female, estrogen receptor positive ([place]  11/08/2017 Initial Diagnosis   Malignant neoplasm of lower-outer quadrant of left breast of female, estrogen receptor positive ([place]     Interval history-  Patient received Zometa first dose on 07/18/2018.  She reports that evening she developed sternal pain radiating under her right breast to  back that hurt with deep breathing.  She also developed  fevers with T-max of 102.  She denies cough or pulmonary congestion, runny nose, sore throat, nausea, vomiting, abdominal pain, other aches or pains.  No rashes reported.  No dyspnea  ECOG FS:1 - Symptomatic but completely ambulatory  Review of systems- Review of Systems  Constitutional: Positive for fever. Negative for chills, diaphoresis and malaise/fatigue.  HENT: Negative for congestion, sinus pain and sore throat.   Respiratory: Negative for cough, hemoptysis, sputum production, shortness of breath and wheezing.   Cardiovascular: Negative for palpitations and orthopnea.  Gastrointestinal: Positive for constipation. Negative for abdominal pain, diarrhea, nausea and vomiting.  Musculoskeletal: Negative for back pain, myalgias and neck pain.  Skin: Negative for rash.  Neurological: Negative for dizziness and headaches.     Current treatment- anastrazole+ Zoldadex  Allergies  Allergen Reactions  . Aspirin Nausea And Vomiting    Past Medical History:  Diagnosis Date  . Cancer ([place]) 06/15/2017   5.1 cm, T3,N1 (clinical): ER/ PR positive, Her 2 neu not overexpressed, High Ki 67. Neuoadjuvant chemotherapy.   . Depression   . Diabetes mellitus without complication ([place]) 1324  . Endometriosis   . Family history of breast cancer   . Headache    migraines  . Hyperlipidemia   . Ovarian mass   . Pneumonia    2018    Past Surgical History:  Procedure Laterality Date  . AXILLARY LYMPH NODE BIOPSY Left 07/14/2017   Procedure: INSERTION GEL MARK CLIP LEFT AXILLA;  Surgeon: Robert Bellow, MD;  Location: [facility] ORS;  Service: General;  Laterality: Left;  . BREAST BIOPSY Left    Dr Orlene Och BREAST METASTATIC CARCINOMA  . BREAST LUMPECTOMY Left 01/12/2018  . OOPHORECTOMY    . PARTIAL MASTECTOMY WITH NEEDLE LOCALIZATION Left 01/12/2018   Procedure: PARTIAL MASTECTOMY WITH NEEDLE LOCALIZATION;  Surgeon: Robert Bellow, MD;  Location: [facility] ORS;  Service: General;  Laterality: Left;   . PORTACATH PLACEMENT Right 07/14/2017   Procedure: INSERTION PORT-A-CATH;  Surgeon: Robert Bellow, MD;  Location: [facility] ORS;  Service: General;  Laterality: Right;  . SENTINEL NODE BIOPSY Left 01/12/2018   Procedure: SENTINEL NODE BIOPSY;  Surgeon: Robert Bellow, MD;  Location: [facility] ORS;  Service: General;  Laterality: Left;  . TUBAL LIGATION      Social History   Socioeconomic History  . Marital status: Single    Spouse name: Not on file  . Number of children: Not on file  . Years of education: Not on file  . Highest education level: Not on file  Occupational History  . Not on file  Social Needs  . Financial resource strain: Not on file  . Food insecurity    Worry: Not on file    Inability: Not on file  . Transportation needs    Medical: Not on file    Non-medical: Not on file  Tobacco Use  . Smoking status: Current Every Day Smoker    Packs/day: 0.50    Years: 11.00    Pack years: 5.50    Types: Cigarettes  . Smokeless tobacco: Former Systems developer    Types: Snuff  Substance and Sexual Activity  . Alcohol use: No    Alcohol/week: 0.0 standard drinks  . Drug use: No  . Sexual activity: Yes  Lifestyle  . Physical activity    Days per week: Not on file    Minutes per session: Not on file  . Stress: Not on file  Relationships  . Social Herbalist on phone: Not on file    Gets together: Not on file    Attends religious service: Not on file    Active member of club or organization: Not on file    Attends meetings of clubs or organizations: Not on file    Relationship status: Not on file  . Intimate partner violence    Fear of current or ex partner: Not on file    Emotionally abused: Not on file    Physically abused: Not on file    Forced sexual activity: Not on file  Other Topics Concern  . Not on file  Social History Narrative  . Not on file    Family History  Problem Relation Age of Onset  . Other Father        No info about father or paternal  relatives  . Diabetes Brother   . Pancreatitis Brother   . Prostate cancer Brother 30       currently 108 / maternal half-brother  . Breast cancer Maternal Grandmother 40       deceased 14s  . Breast cancer Maternal Aunt 65       currently 13  . Breast cancer Other 82       mother's sister; deceased 96  . Breast cancer Other        mother's sister; age at dx unknown     Current Outpatient Medications:  .  albuterol (PROVENTIL HFA;VENTOLIN HFA) 108 (90 Base) MCG/ACT inhaler, Inhale 2 puffs into the lungs every 6 (six) hours as needed for wheezing or shortness of breath., Disp: 1 Inhaler, Rfl: 0 .  anastrozole (ARIMIDEX) 1 MG tablet, Take 1 tablet (1 mg total) by mouth daily., Disp: 30 tablet, Rfl: 3 .  aspirin-acetaminophen-caffeine (EXCEDRIN MIGRAINE) 250-250-65 MG tablet, Take 2 tablets by mouth daily as needed for headache., Disp: , Rfl:  .  Blood Glucose Monitoring Suppl (FIFTY50 GLUCOSE METER 2.0) w/Device KIT, Use as directed, Disp: , Rfl:  .  canagliflozin (INVOKANA) 300 MG TABS tablet, Take 300 mg by mouth daily., Disp: , Rfl:  .  Cholecalciferol (VITAMIN D3) 125 MCG (5000 UT) CAPS, Take 1 capsule (5,000 Units total) by mouth daily with breakfast. Take along with calcium and magnesium., Disp: 100 capsule, Rfl: 0 .  gabapentin (NEURONTIN) 300 MG capsule, Take 3 capsules (900 mg total) by mouth 4 (four) times daily., Disp: 1200 capsule, Rfl: 0 .  glucose blood (ONE TOUCH ULTRA TEST) test strip, Use up to 4 times/day, Disp: 100 each, Rfl: 12 .  goserelin (ZOLADEX) 3.6 MG injection, Inject 3.6 mg into the skin every 28 (twenty-eight) days., Disp: , Rfl:  .  Insulin Glargine (BASAGLAR KWIKPEN) 100 UNIT/ML SOPN, INJECT 0.24 MLS (24 UNITS TOTAL) INTO THE SKIN AT BEDTIME. (Patient taking differently: Inject 44 Units into the skin at bedtime. ), Disp: 15 pen, Rfl: 2 .  Insulin Pen Needle 32G X 4 MM MISC, 1 Units by Does not apply route every morning. Pen needles, Disp: 90 each, Rfl: 3 .   lidocaine-prilocaine (EMLA) cream, Apply to affected area once (Patient taking differently: Apply 1 application topically daily as needed (port access). Ap), Disp: 30 g, Rfl: 3 .  Magnesium 500 MG CAPS, Take 1 capsule (500 mg total) by mouth 2 (two) times daily at 8 am and 10 pm., Disp: 200 capsule, Rfl: 0 .  metFORMIN (GLUCOPHAGE) 500 MG tablet, Take 1,000 mg by mouth 2 (two) times daily  with a meal., Disp: , Rfl:  .  ondansetron (ZOFRAN) 8 MG tablet, Take 1 tablet (8 mg total) by mouth 2 (two) times daily as needed. Start on the third day after chemotherapy., Disp: 30 tablet, Rfl: 1 .  oxyCODONE (OXY IR/ROXICODONE) 5 MG immediate release tablet, Take 1-2 tablets (5-10 mg total) by mouth every 6 (six) hours as needed for up to 30 days for severe pain. Must last 30 days, Disp: 60 tablet, Rfl: 0 .  prochlorperazine (COMPAZINE) 10 MG tablet, Take 1 tablet (10 mg total) by mouth every 6 (six) hours as needed (Nausea or vomiting)., Disp: 30 tablet, Rfl: 1 No current facility-administered medications for this visit.   Facility-Administered Medications Ordered in Other Visits:  .  heparin lock flush 100 unit/mL, 500 Units, Intravenous, Once, Corcoran, Melissa C, MD .  sodium chloride flush (NS) 0.9 % injection 10 mL, 10 mL, Intravenous, Once, Lequita Asal, MD  Physical exam:  Vitals:   07/19/18 1517  BP: 106/66  Pulse: (!) 108  Resp: (!) 22  Temp: 99.1 F (37.3 C)  TempSrc: Tympanic  SpO2: 97%  Weight: 189 lb 6.4 oz (85.9 kg)  Height: '5\' 3"'  (1.6 m)   Physical Exam Constitutional:      Appearance: Normal appearance.  Neck:     Musculoskeletal: Normal range of motion and neck supple.  Cardiovascular:     Rate and Rhythm: Normal rate and regular rhythm.  Pulmonary:     Effort: Pulmonary effort is normal. No respiratory distress.     Breath sounds: Normal breath sounds. No stridor. No wheezing, rhonchi or rales.  Chest:     Chest wall: No tenderness.  Abdominal:     General: Bowel  sounds are normal.     Palpations: Abdomen is soft.  Musculoskeletal: Normal range of motion.  Skin:    General: Skin is warm and dry.     Findings: No rash.  Neurological:     General: No focal deficit present.     Mental Status: She is alert and oriented to person, place, and time.      CMP Latest Ref Rng & Units 07/18/2018  Glucose 70 - 99 mg/dL 312(H)  BUN 6 - 20 mg/dL 8  Creatinine 0.44 - 1.00 mg/dL 0.61  Sodium 135 - 145 mmol/L 138  Potassium 3.5 - 5.1 mmol/L 3.7  Chloride 98 - 111 mmol/L 104  CO2 22 - 32 mmol/L 25  Calcium 8.9 - 10.3 mg/dL 8.3(L)  Total Protein 6.5 - 8.1 g/dL -  Total Bilirubin 0.3 - 1.2 mg/dL -  Alkaline Phos 38 - 126 U/L -  AST 15 - 41 U/L -  ALT 0 - 44 U/L -   CBC Latest Ref Rng & Units 05/22/2018  WBC 4.0 - 10.5 K/uL 6.2  Hemoglobin 12.0 - 15.0 g/dL 14.8  Hematocrit 36.0 - 46.0 % 45.8  Platelets 150 - 400 K/uL 227    No images are attached to the encounter.  No results found.  Assessment and plan- Patient is a 53 y.o. female with stage III breast cancer status post chemotherapy now on anastrazole+ Zoldadex with development of fevers and pleuritic pain following dose of Zometa.  Pleuritic pain: Patient reports that her pain is improving today. She has no current shortness of breath. She did not have a fever upon presentation to the clinic.  Zometa can cause bone pain and flulike syndrome in some patients.  It seems likely that her symptoms are related to Zometa  dosing with possible acute reaction.  However, cannot rule out viral syndrome such as influenza or COVID.  These seem less likely as patient has no other symptoms.  Case and plan discussed at length with Dr. Rogue Bussing.  Patient is advised to hold antipyretics and track fever curve overnight.  We will schedule a televisit with patient tomorrow.  If symptoms persist overnight, would likely recommend additional work-up including influenza and COVID testing and/or chest x-ray.  Patient advised  to utilize the ER in the event of severe dyspnea or worsening symptoms overnight.     Patient expressed understanding and was in agreement with this plan. She also understands that She can call clinic at any time with any questions, concerns, or complaints.   Thank you for allowing me to participate in the care of this very pleasant patient.   Time Total: 20 minutes  Visit consisted of counseling and education dealing with the complex and emotionally intense issues of symptom management in the setting of serious and potentially life-threatening illness.Greater than 50%  of this time was spent counseling and coordinating care related to the above assessment and plan.  Signed by: Altha Harm, PhD, NP-C [phone] (Work Cell)   CC:

## 2018-07-19 NOTE — Telephone Encounter (Signed)
Patient called and states she had Zometa infusion yesterday and today she is having fever of 100.7, severe lung pain which is worse when she bends over it feels like her lung is going to fall out. Please advise

## 2018-07-19 NOTE — Telephone Encounter (Signed)
Carolyn Rowan- please have pt seen in [facility] today.  Thanks GB

## 2018-07-19 NOTE — Telephone Encounter (Signed)
Patient accepts appointment for 3 PM today

## 2018-07-20 ENCOUNTER — Inpatient Hospital Stay (HOSPITAL_BASED_OUTPATIENT_CLINIC_OR_DEPARTMENT_OTHER): Payer: 59 | Admitting: Nurse Practitioner

## 2018-07-20 DIAGNOSIS — Z170 Estrogen receptor positive status [ER+]: Secondary | ICD-10-CM

## 2018-07-20 DIAGNOSIS — C50512 Malignant neoplasm of lower-outer quadrant of left female breast: Secondary | ICD-10-CM | POA: Diagnosis not present

## 2018-07-20 DIAGNOSIS — E119 Type 2 diabetes mellitus without complications: Secondary | ICD-10-CM | POA: Diagnosis not present

## 2018-07-20 DIAGNOSIS — R502 Drug induced fever: Secondary | ICD-10-CM

## 2018-07-20 DIAGNOSIS — Z79899 Other long term (current) drug therapy: Secondary | ICD-10-CM

## 2018-07-20 DIAGNOSIS — Z803 Family history of malignant neoplasm of breast: Secondary | ICD-10-CM | POA: Diagnosis not present

## 2018-07-20 DIAGNOSIS — Z79811 Long term (current) use of aromatase inhibitors: Secondary | ICD-10-CM

## 2018-07-20 DIAGNOSIS — F17210 Nicotine dependence, cigarettes, uncomplicated: Secondary | ICD-10-CM

## 2018-07-20 NOTE — Progress Notes (Signed)
Virtual Visit Progress Note  [facility]  Telephone:([phone] [phone]  I connected with Carolyn Roy on 07/20/18 at 11:30 AM EDT by telephone visit and verified that I am speaking with the correct person using two identifiers.   I discussed the limitations, risks, security and privacy concerns of performing an evaluation and management service by telemedicine and the availability of in-person appointments. I also discussed with the patient that there may be a patient responsible charge related to this service. The patient expressed understanding and agreed to proceed.   Other persons participating in the visit and their role in the encounter:   Patient's location: home Provider's location: home  Chief Complaint: pleuritic pain & fever    Patient Care Team: Patient, No Pcp Per as PCP - General (General Practice) [name], [name], MD (General Surgery) [name], [name], [name] (Obstetrics and Gynecology)   Name of the patient: Carolyn Roy  [id number]  Feb 28, 1965   Date of visit: 07/20/18  Diagnosis- Stage IIIA breast cancer  Chief complaint/ Reason for visit- Pleuritic pain & fever  Heme/Onc history:  Oncology History Overview Note  # MAY 2019-  clinical stage IIIA (T3N1Mx) left breast cancer s/p biopsy on 06/14/2017. -Pathology revealed grade III invasive ductal carcinoma. -Axillary FNA revealed malignant cells c/w metastatic carcinoma. Tumor was ER + (90%), PR + (30%), Her2/neu - and Ki67 70%.  CA27.29 was 7.8 on 06/14/2017.  # She received 4 cycles of AC with Neulasta support (07/20/2017 - 08/31/2017).;  neoadjuvant Taxol on 09/14/2017.  #DEC 2019- Lumpectomy/sentinel lymph node biopsy [Dr.[name]]-complete pathologic response  # s/p RT [delayed sec to wound infection; Dr.[name]] finished RT [4/12]  # April 14th 2020- START TAM;  stopped in mid-May secondary intolerance [severe migraines].  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 Secord/Berchuck]-negative for malignancy/ # DM-2- poorly controlled.   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  -------------------------------------------  DIAGNOSIS: left breast cancer  STAGE:  III       ;GOALS: cure  CURRENT/MOST RECENT THERAPY Tam    Cancer of midline of breast, left ([place])  06/15/2017 Initial Diagnosis   Cancer of midline of breast, left ([place])   06/26/2017 -  Chemotherapy   The patient had DOXOrubicin (ADRIAMYCIN) chemo injection 122 mg, 60 mg/m2 = 122 mg, Intravenous,  Once, 4 of 4 cycles Administration: 122 mg (07/20/2017), 122 mg (08/03/2017), 122 mg (08/17/2017), 122 mg (08/31/2017) palonosetron (ALOXI) injection 0.25 mg, 0.25 mg, Intravenous,  Once, 4 of 4 cycles Administration: 0.25 mg (07/20/2017), 0.25 mg (08/03/2017), 0.25 mg (08/17/2017), 0.25 mg (08/31/2017) pegfilgrastim (NEULASTA) injection 6 mg, 6 mg, Subcutaneous, Once, 5 of 5 cycles Administration: 6 mg (07/21/2017), 6 mg (08/04/2017), 6 mg (08/18/2017), 6 mg (09/01/2017) cyclophosphamide (CYTOXAN) 1,220 mg in sodium chloride 0.9 % 250 mL chemo infusion, 600 mg/m2 = 1,220 mg, Intravenous,  Once, 4 of 4 cycles Administration: 1,220 mg (07/20/2017), 1,220 mg (08/03/2017), 1,220 mg (08/17/2017), 1,220 mg (08/31/2017) PACLitaxel (TAXOL) 162 mg in sodium chloride 0.9 % 250 mL chemo infusion (</= 10m/m2), 80 mg/m2 = 162 mg, Intravenous,  Once, 10 of 12 cycles Dose modification: 65 mg/m2 (original dose 80 mg/m2, Cycle 13, Reason: Provider Judgment, Comment: neuropathy) Administration: 162 mg (09/14/2017), 162 mg (09/21/2017), 162 mg (09/28/2017), 162 mg (10/05/2017), 162 mg (  10/12/2017), 162 mg (10/19/2017), 162 mg (10/26/2017), 132 mg (11/02/2017), 132 mg  (11/09/2017), 132 mg (11/16/2017) fosaprepitant (EMEND) 150 mg, dexamethasone (DECADRON) 12 mg in sodium chloride 0.9 % 145 mL IVPB, , Intravenous,  Once, 4 of 4 cycles Administration:  (07/20/2017),  (08/03/2017),  (08/17/2017),  (08/31/2017)  for chemotherapy treatment.    Malignant neoplasm of lower-outer quadrant of left breast of female, estrogen receptor positive ([place])  11/08/2017 Initial Diagnosis   Malignant neoplasm of lower-outer quadrant of left breast of female, estrogen receptor positive ([place])     Interval history- Carolyn Roy, 53 year old female with above history of left breast cancer, who presents to [facility] for pleuritic pain and fever. She was seen in Symptom Management for symptoms yesterday. She says that overnight she had max temperature of 102.1 and felt poorly so she took Tylenol. She says she then slept well and believes fever 'broke'. She woke this morning with evidence of sweating. Temperature this morning was 98.7 and she felt well and opted to go to work. She has continued to feel well since then and fever has stayed absent without tylenol. She says that today she feels at baseline. No chest pain. No sweats. No cough. No shortnes of breath. No rash, diarrhea, or changes to taste.   ECOG FS:0 - Asymptomatic  Review of systems- Review of Systems  Constitutional: Negative.  Negative for chills, diaphoresis, fever and malaise/fatigue.  HENT: Negative.  Negative for congestion, hearing loss, sinus pain, sore throat and tinnitus.   Eyes: Negative.  Negative for pain and redness.  Respiratory: Negative.  Negative for cough, sputum production, shortness of breath and wheezing.   Cardiovascular: Negative.  Negative for chest pain and palpitations.  Gastrointestinal: Negative.  Negative for diarrhea and nausea.  Genitourinary: Negative.   Musculoskeletal: Negative.  Negative for myalgias.  Skin: Negative.  Negative for rash.  Neurological: Negative.   Negative for dizziness and headaches.  Endo/Heme/Allergies: Negative for environmental allergies.  Psychiatric/Behavioral: Negative.  Negative for depression. The patient is not nervous/anxious.      Current treatment- anastrazole & zoladex  Allergies  Allergen Reactions  . Aspirin Nausea And Vomiting    Past Medical History:  Diagnosis Date  . Cancer ([place]) 06/15/2017   5.1 cm, T3,N1 (clinical): ER/ PR positive, Her 2 neu not overexpressed, High Ki 67. Neuoadjuvant chemotherapy.   . Depression   . Diabetes mellitus without complication ([place]) 0109  . Endometriosis   . Family history of breast cancer   . Headache    migraines  . Hyperlipidemia   . Ovarian mass   . Pneumonia    2018    Past Surgical History:  Procedure Laterality Date  . AXILLARY LYMPH NODE BIOPSY Left 07/14/2017   Procedure: INSERTION GEL MARK CLIP LEFT AXILLA;  Surgeon: Robert Bellow, MD;  Location: [facility] ORS;  Service: General;  Laterality: Left;  . BREAST BIOPSY Left    Dr Orlene Och BREAST METASTATIC CARCINOMA  . BREAST LUMPECTOMY Left 01/12/2018  . OOPHORECTOMY    . PARTIAL MASTECTOMY WITH NEEDLE LOCALIZATION Left 01/12/2018   Procedure: PARTIAL MASTECTOMY WITH NEEDLE LOCALIZATION;  Surgeon: Robert Bellow, MD;  Location: [facility] ORS;  Service: General;  Laterality: Left;  . PORTACATH PLACEMENT Right 07/14/2017   Procedure: INSERTION PORT-A-CATH;  Surgeon: Robert Bellow, MD;  Location: [facility] ORS;  Service: General;  Laterality: Right;  . SENTINEL NODE BIOPSY Left 01/12/2018   Procedure: SENTINEL NODE BIOPSY;  Surgeon: Robert Bellow, MD;  Location:  [facility] ORS;  Service: General;  Laterality: Left;  . TUBAL LIGATION      Social History   Socioeconomic History  . Marital status: Single    Spouse name: Not on file  . Number of children: Not on file  . Years of education: Not on file  . Highest education level: Not on file  Occupational History  . Not on file  Social Needs  . Financial  resource strain: Not on file  . Food insecurity    Worry: Not on file    Inability: Not on file  . Transportation needs    Medical: Not on file    Non-medical: Not on file  Tobacco Use  . Smoking status: Current Every Day Smoker    Packs/day: 0.50    Years: 11.00    Pack years: 5.50    Types: Cigarettes  . Smokeless tobacco: Former Systems developer    Types: Snuff  Substance and Sexual Activity  . Alcohol use: No    Alcohol/week: 0.0 standard drinks  . Drug use: No  . Sexual activity: Yes  Lifestyle  . Physical activity    Days per week: Not on file    Minutes per session: Not on file  . Stress: Not on file  Relationships  . Social Herbalist on phone: Not on file    Gets together: Not on file    Attends religious service: Not on file    Active member of club or organization: Not on file    Attends meetings of clubs or organizations: Not on file    Relationship status: Not on file  . Intimate partner violence    Fear of current or ex partner: Not on file    Emotionally abused: Not on file    Physically abused: Not on file    Forced sexual activity: Not on file  Other Topics Concern  . Not on file  Social History Narrative  . Not on file    Family History  Problem Relation Age of Onset  . Other Father        No info about father or paternal relatives  . Diabetes Brother   . Pancreatitis Brother   . Prostate cancer Brother 65       currently 28 / maternal half-brother  . Breast cancer Maternal Grandmother 40       deceased 65s  . Breast cancer Maternal Aunt 65       currently 24  . Breast cancer Other 49       mother's sister; deceased 38  . Breast cancer Other        mother's sister; age at dx unknown     Current Outpatient Medications:  .  albuterol (PROVENTIL HFA;VENTOLIN HFA) 108 (90 Base) MCG/ACT inhaler, Inhale 2 puffs into the lungs every 6 (six) hours as needed for wheezing or shortness of breath., Disp: 1 Inhaler, Rfl: 0 .  anastrozole (ARIMIDEX)  1 MG tablet, Take 1 tablet (1 mg total) by mouth daily., Disp: 30 tablet, Rfl: 3 .  aspirin-acetaminophen-caffeine (EXCEDRIN MIGRAINE) 250-250-65 MG tablet, Take 2 tablets by mouth daily as needed for headache., Disp: , Rfl:  .  Blood Glucose Monitoring Suppl (FIFTY50 GLUCOSE METER 2.0) w/Device KIT, Use as directed, Disp: , Rfl:  .  canagliflozin (INVOKANA) 300 MG TABS tablet, Take 300 mg by mouth daily., Disp: , Rfl:  .  Cholecalciferol (VITAMIN D3) 125 MCG (5000 UT) CAPS, Take 1 capsule (5,000 Units total) by  mouth daily with breakfast. Take along with calcium and magnesium., Disp: 100 capsule, Rfl: 0 .  gabapentin (NEURONTIN) 300 MG capsule, Take 3 capsules (900 mg total) by mouth 4 (four) times daily., Disp: 1200 capsule, Rfl: 0 .  glucose blood (ONE TOUCH ULTRA TEST) test strip, Use up to 4 times/day, Disp: 100 each, Rfl: 12 .  goserelin (ZOLADEX) 3.6 MG injection, Inject 3.6 mg into the skin every 28 (twenty-eight) days., Disp: , Rfl:  .  Insulin Glargine (BASAGLAR KWIKPEN) 100 UNIT/ML SOPN, INJECT 0.24 MLS (24 UNITS TOTAL) INTO THE SKIN AT BEDTIME. (Patient taking differently: Inject 44 Units into the skin at bedtime. ), Disp: 15 pen, Rfl: 2 .  Insulin Pen Needle 32G X 4 MM MISC, 1 Units by Does not apply route every morning. Pen needles, Disp: 90 each, Rfl: 3 .  lidocaine-prilocaine (EMLA) cream, Apply to affected area once (Patient taking differently: Apply 1 application topically daily as needed (port access). Ap), Disp: 30 g, Rfl: 3 .  Magnesium 500 MG CAPS, Take 1 capsule (500 mg total) by mouth 2 (two) times daily at 8 am and 10 pm., Disp: 200 capsule, Rfl: 0 .  metFORMIN (GLUCOPHAGE) 500 MG tablet, Take 1,000 mg by mouth 2 (two) times daily with a meal., Disp: , Rfl:  .  ondansetron (ZOFRAN) 8 MG tablet, Take 1 tablet (8 mg total) by mouth 2 (two) times daily as needed. Start on the third day after chemotherapy., Disp: 30 tablet, Rfl: 1 .  oxyCODONE (OXY IR/ROXICODONE) 5 MG immediate  release tablet, Take 1-2 tablets (5-10 mg total) by mouth every 6 (six) hours as needed for up to 30 days for severe pain. Must last 30 days, Disp: 60 tablet, Rfl: 0 .  prochlorperazine (COMPAZINE) 10 MG tablet, Take 1 tablet (10 mg total) by mouth every 6 (six) hours as needed (Nausea or vomiting)., Disp: 30 tablet, Rfl: 1 No current facility-administered medications for this visit.   Facility-Administered Medications Ordered in Other Visits:  .  heparin lock flush 100 unit/mL, 500 Units, Intravenous, Once, Corcoran, Melissa C, MD .  sodium chloride flush (NS) 0.9 % injection 10 mL, 10 mL, Intravenous, Once, Corcoran, Drue Second, MD  Physical exam: Exam limited due to telemedicine  There were no vitals filed for this visit. Physical Exam   CMP Latest Ref Rng & Units 07/18/2018  Glucose 70 - 99 mg/dL 312(H)  BUN 6 - 20 mg/dL 8  Creatinine 0.44 - 1.00 mg/dL 0.61  Sodium 135 - 145 mmol/L 138  Potassium 3.5 - 5.1 mmol/L 3.7  Chloride 98 - 111 mmol/L 104  CO2 22 - 32 mmol/L 25  Calcium 8.9 - 10.3 mg/dL 8.3(L)  Total Protein 6.5 - 8.1 g/dL -  Total Bilirubin 0.3 - 1.2 mg/dL -  Alkaline Phos 38 - 126 U/L -  AST 15 - 41 U/L -  ALT 0 - 44 U/L -   CBC Latest Ref Rng & Units 05/22/2018  WBC 4.0 - 10.5 K/uL 6.2  Hemoglobin 12.0 - 15.0 g/dL 14.8  Hematocrit 36.0 - 46.0 % 45.8  Platelets 150 - 400 K/uL 227    No images are attached to the encounter.  No results found.  Assessment and plan- Patient is a 53 y.o. female diagnosed with stage III breast cancer status post chemotherapy, currently on anastrozole and Zoladex who presents to symptom management clinic for fevers and pleuritic pain following Zometa administration on 07/18/2018.   Pleuritic pain and fever-symptoms now resolved.  Suspect  symptoms secondary to Zometa.  Advised patient to continue monitoring temperature and notify clinic if fever returns or new symptoms.  Follow-up with Dr.Brahmanday as scheduled.    Visit Diagnosis 1.  Drug-induced fever     Patient expressed understanding and was in agreement with this plan. She also understands that She can call clinic at any time with any questions, concerns, or complaints.   I discussed the assessment and treatment plan with the patient. The patient was provided an opportunity to ask questions and all were answered. The patient agreed with the plan and demonstrated an understanding of the instructions.   The patient was advised to call back or seek an in-person evaluation if the symptoms worsen or if the condition fails to improve as anticipated.   I provided 10 minutes of non face-to-face telephone visit time during this encounter, and > 50% was spent counseling as documented under my assessment & plan.  Thank you for allowing me to participate in the care of this very pleasant patient.   Beckey Rutter, DNP, AGNP-C [facility] [place] (work cell) [phone] (office)  CC: Dr. Rogue Bussing

## 2018-07-24 ENCOUNTER — Other Ambulatory Visit: Payer: Self-pay | Admitting: Pain Medicine

## 2018-07-24 DIAGNOSIS — M47816 Spondylosis without myelopathy or radiculopathy, lumbar region: Secondary | ICD-10-CM

## 2018-07-24 NOTE — Progress Notes (Signed)
[employer] DENIAL: Today, 07/24/2018, I called [employer] customer services at [phone].  To see if I could clarify this problem with the patient's denials of the lumbar facet blocks.  On March 14, 2018, while I was personally evaluating the patient face-to-face, and after having completed the patient's physical exam, it was clear to me that this patient had a bilateral lumbar facet syndrome.  At the time there were no x-rays or MRIs available so I went ahead and I order an x-ray of the lumbar spine on flexion and extension as well as a diagnostic bilateral lumbar facet block under fluoroscopic guidance and IV sedation.  On the follow-up evaluation on 04/09/2018 the official report on the lumbar x-rays was read as negative.  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  This was not described anywhere on the x-rays.  On that date I was also informed that [place] had denied the diagnostic lumbar facet blocks.  These diagnostic lumbar facet blocks were ordered for the purpose of pharmacologically confirming through chemical blockade of nerve conduction, that the lumbar facet joints were in fact responsible for the patient's symptoms.  In addition, this blocks are always performed for the purpose of determining whether or not the patient may be a candidate for radiofrequency ablation of the lumbar facet medial branch nerves.  Because the insurance companies are notorious for throwing roadblocks at the treatment of patients, and most the time they will not approve for lumbar facet blocks without having had a trial of physical therapy, on 04/09/2018 I went ahead and did order a physical therapy trial for this patient, even though anybody who knows anything about lumbar facet syndrome knows that the patient is likely to fail the trial since the facet joint pain usually gets worse with  physical therapy and not better.  Because during my personal evaluation of the "negative" lumbar x-rays I found the patient to have facet arthropathy, I went ahead and ordered a lumbar MRI to either confirm or refute my findings.  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  Not only today confirm that but it also showed that there is facet joint effusion, indicating that there is rather acute on chronic pathology involving the facet joints.  Because by then the patient had already failed her physical therapy, as initially suspected, and we had clear imaging showing pathology, I went ahead and reordered a diagnostic bilateral lumbar facet blocks.  These were again denied with the following denial rationale description: "Based on [employer] clinical policy balloting (CPB) #0016: Back pain invasive procedures, we are unable to approve the requested procedure.  Facet joint injections (intra-articular and medial branch blocks) are supported when there is a plan to perform radiofrequency ablation/facet neurotomy in the event that the facet joint injections/medial branch block are successful.  The clinical information provided does not meet this criteria and, therefore the requested procedure is not indicated at this time."  In addition, "[employer] considers facet joint injections (intra-articular and medial branch blocks) containing corticosteroids to be therapeutic injections.  Facet joint injections are considered experimental and investigational as therapy for back and neck pain and for all other indications because their effectiveness has not been established.  They  clinical information provided describes a facet joint injection using corticosteroids and therefore the requested procedure is not indicated at this time."  After having spent 37 minutes  on the phone, 20 minutes and 46 seconds of which they had me on hold, the determination appears to be that if I attempt to inject any steroids to treat this patient's facet joint effusion, which is the standard treatment for any type of joint effusion, according to their policy this will not be approved and it will be denied.  Now, if I go ahead and do a diagnostic bilateral lumbar facet injection with local anesthetic only, and do not offer of the patient the possible benefit of getting relief from these joint effusions, they will allow me to do the injection as long as it is for the purpose of doing a lumbar facet radiofrequency ablation, whether or not this is something that the patient is interested in or not.  Today I have requested that this case be reviewed.  However, I will not be holding my breath since it is extremely clear to me that this patient's health is not in the best interest of this company.

## 2018-08-13 ENCOUNTER — Other Ambulatory Visit: Payer: Self-pay

## 2018-08-13 ENCOUNTER — Other Ambulatory Visit
Admission: RE | Admit: 2018-08-13 | Discharge: 2018-08-13 | Disposition: A | Discharge status: Home / Self Care | Payer: 59 | Source: Ambulatory Visit | Attending: Pain Medicine | Admitting: Pain Medicine

## 2018-08-13 DIAGNOSIS — Z01812 Encounter for preprocedural laboratory examination: Secondary | ICD-10-CM | POA: Diagnosis not present

## 2018-08-13 DIAGNOSIS — Z1159 Encounter for screening for other viral diseases: Secondary | ICD-10-CM | POA: Diagnosis not present

## 2018-08-14 LAB — SARS CORONAVIRUS 2 (TAT 6-24 HRS): SARS Coronavirus 2: NEGATIVE

## 2018-08-16 ENCOUNTER — Encounter: Payer: Self-pay | Admitting: Pain Medicine

## 2018-08-16 ENCOUNTER — Ambulatory Visit
Admission: RE | Admit: 2018-08-16 | Discharge: 2018-08-16 | Disposition: A | Discharge status: Home / Self Care | Payer: 59 | Source: Ambulatory Visit | Attending: Pain Medicine | Admitting: Pain Medicine

## 2018-08-16 ENCOUNTER — Other Ambulatory Visit: Payer: Self-pay

## 2018-08-16 ENCOUNTER — Ambulatory Visit (HOSPITAL_BASED_OUTPATIENT_CLINIC_OR_DEPARTMENT_OTHER): Payer: 59 | Admitting: Pain Medicine

## 2018-08-16 VITALS — BP 113/71 | HR 90 | Temp 98.1°F | Resp 20 | Ht 64.0 in | Wt 189.0 lb

## 2018-08-16 DIAGNOSIS — G8929 Other chronic pain: Secondary | ICD-10-CM | POA: Insufficient documentation

## 2018-08-16 DIAGNOSIS — M47817 Spondylosis without myelopathy or radiculopathy, lumbosacral region: Secondary | ICD-10-CM | POA: Insufficient documentation

## 2018-08-16 DIAGNOSIS — M545 Low back pain, unspecified: Secondary | ICD-10-CM

## 2018-08-16 DIAGNOSIS — M47816 Spondylosis without myelopathy or radiculopathy, lumbar region: Secondary | ICD-10-CM | POA: Diagnosis not present

## 2018-08-16 DIAGNOSIS — M5137 Other intervertebral disc degeneration, lumbosacral region: Secondary | ICD-10-CM | POA: Diagnosis not present

## 2018-08-16 MED ORDER — LACTATED RINGERS IV SOLN: 1000.0000 mL | Freq: Once | INTRAVENOUS | Status: DC

## 2018-08-16 MED ORDER — FENTANYL CITRATE (PF) 100 MCG/2ML IJ SOLN: 25.0000 ug | INTRAMUSCULAR | Status: DC | PRN

## 2018-08-16 MED ORDER — MIDAZOLAM HCL 5 MG/5ML IJ SOLN: 1.0000 mg | INTRAMUSCULAR | Status: DC | PRN

## 2018-08-16 MED ORDER — ROPIVACAINE HCL 2 MG/ML IJ SOLN
INTRAMUSCULAR | Status: AC
  Filled 2018-08-16: qty 10

## 2018-08-16 MED ORDER — TRIAMCINOLONE ACETONIDE 40 MG/ML IJ SUSP
INTRAMUSCULAR | Status: AC
  Filled 2018-08-16: qty 1

## 2018-08-16 MED ORDER — LIDOCAINE HCL 2 % IJ SOLN
20.0000 mL | Freq: Once | INTRAMUSCULAR | Status: AC
  Administered 2018-08-16: 400 mg
  Filled 2018-08-16: qty 20

## 2018-08-16 MED ORDER — ROPIVACAINE HCL 2 MG/ML IJ SOLN
18.0000 mL | Freq: Once | INTRAMUSCULAR | Status: AC
  Administered 2018-08-16: 18 mL via PERINEURAL
  Filled 2018-08-16: qty 20

## 2018-08-16 NOTE — Progress Notes (Signed)
Patient's Name: Carolyn Roy  MRN: [id number]  Referring Provider: Milinda Pointer, MD  DOB: 01/15/1966  PCP: Patient, No Pcp Per  DOS: 08/16/2018  Note by: Gaspar Cola, MD  Service setting: Ambulatory outpatient  Specialty: Interventional Pain Management  Patient type: Established  Location: [facility] (AMB) Pain Management Facility  Visit type: Interventional Procedure   Primary Reason for Visit: Interventional Pain Management Treatment. CC: Back Pain (lower)  Procedure:          Anesthesia, Analgesia, Anxiolysis:  Type: Lumbar Facet, Medial Branch Block(s) #2  Primary Purpose: Diagnostic Region: Posterolateral Lumbosacral Spine Level: L2, L3, L4, L5, & S1 Medial Branch Level(s). Injecting these levels blocks the L3-4, L4-5, and L5-S1 lumbar facet joints. Laterality: Bilateral  Type: Local Anesthesia Indication(s): Analgesia         Route: Infiltration ([place]/IM) IV Access: Declined Sedation: None since NPO instructions were not followed  Local Anesthetic: Lidocaine 1-2%  Position: Prone   Indications: 1. Lumbar facet syndrome (Bilateral) (L>R)   2. Spondylosis without myelopathy or radiculopathy, lumbosacral region   3. Chronic low back pain (Bilateral (L>R) w/o sciatica   4. Lumbar facet arthropathy   5. DDD (degenerative disc disease), lumbosacral    Pain Score: Pre-procedure: 5 /10 Post-procedure: 0-No pain/10  Pre-op Assessment:  Carolyn Roy is a 53 y.o. (year old), female patient, seen today for interventional treatment. She  has a past surgical history that includes Oophorectomy; Tubal ligation; Portacath placement (Right, 07/14/2017); Axillary lymph node biopsy (Left, 07/14/2017); Breast lumpectomy (Left, 01/12/2018); Breast biopsy (Left); Partial mastectomy with needle localization (Left, 01/12/2018); and Sentinel node biopsy (Left, 01/12/2018). Carolyn Roy has a current medication list which includes the following prescription(s): albuterol, anastrozole,  aspirin-acetaminophen-caffeine, fifty50 glucose meter 2.0, canagliflozin, vitamin d3, gabapentin, glucose blood, goserelin, basaglar kwikpen, insulin pen needle, magnesium, ondansetron, oxycodone, prochlorperazine, lidocaine-prilocaine, and metformin, and the following Facility-Administered Medications: heparin lock flush and sodium chloride flush. Her primarily concern today is the Back Pain (lower)  Initial Vital Signs:  Pulse/HCG Rate: 98  Temp: 98.1 F (36.7 C) Resp: 18 BP: 121/86 SpO2: 99 %  BMI: Estimated body mass index is 32.44 kg/m as calculated from the following:   Height as of this encounter: 5\' 4"  (1.626 m).   Weight as of this encounter: 189 lb (85.7 kg).  Risk Assessment: Allergies: Reviewed. She is allergic to aspirin.  Allergy Precautions: None required Coagulopathies: Reviewed. None identified.  Blood-thinner therapy: None at this time Active Infection(s): Reviewed. None identified. Carolyn Roy is afebrile  Site Confirmation: Carolyn Roy was asked to confirm the procedure and laterality before marking the site Procedure checklist: Completed Consent: Before the procedure and under the influence of no sedative(s), amnesic(s), or anxiolytics, the patient was informed of the treatment options, risks and possible complications. To fulfill our ethical and legal obligations, as recommended by the American Medical Association's Code of Ethics, I have informed the patient of my clinical impression; the nature and purpose of the treatment or procedure; the risks, benefits, and possible complications of the intervention; the alternatives, including doing nothing; the risk(s) and benefit(s) of the alternative treatment(s) or procedure(s); and the risk(s) and benefit(s) of doing nothing. The patient was provided information about the general risks and possible complications associated with the procedure. These may include, but are not limited to: failure to achieve desired goals, infection,  bleeding, organ or nerve damage, allergic reactions, paralysis, and death. In addition, the patient was informed of those risks and complications associated to Spine-related procedures,  such as failure to decrease pain; infection (i.e.: Meningitis, epidural or intraspinal abscess); bleeding (i.e.: epidural hematoma, subarachnoid hemorrhage, or any other type of intraspinal or peri-dural bleeding); organ or nerve damage (i.e.: Any type of peripheral nerve, nerve root, or spinal cord injury) with subsequent damage to sensory, motor, and/or autonomic systems, resulting in permanent pain, numbness, and/or weakness of one or several areas of the body; allergic reactions; (i.e.: anaphylactic reaction); and/or death. Furthermore, the patient was informed of those risks and complications associated with the medications. These include, but are not limited to: allergic reactions (i.e.: anaphylactic or anaphylactoid reaction(s)); adrenal axis suppression; blood sugar elevation that in diabetics may result in ketoacidosis or comma; water retention that in patients with history of congestive heart failure may result in shortness of breath, pulmonary edema, and decompensation with resultant heart failure; weight gain; swelling or edema; medication-induced neural toxicity; particulate matter embolism and blood vessel occlusion with resultant organ, and/or nervous system infarction; and/or aseptic necrosis of one or more joints. Finally, the patient was informed that Medicine is not an exact science; therefore, there is also the possibility of unforeseen or unpredictable risks and/or possible complications that may result in a catastrophic outcome. The patient indicated having understood very clearly. We have given the patient no guarantees and we have made no promises. Enough time was given to the patient to ask questions, all of which were answered to the patient's satisfaction. Carolyn Roy has indicated that she wanted to  continue with the procedure. Attestation: I, the ordering provider, attest that I have discussed with the patient the benefits, risks, side-effects, alternatives, likelihood of achieving goals, and potential problems during recovery for the procedure that I have provided informed consent. Date  Time: 08/16/2018 12:21 PM  Pre-Procedure Preparation:  Monitoring: As per clinic protocol. Respiration, ETCO2, SpO2, BP, heart rate and rhythm monitor placed and checked for adequate function Safety Precautions: Patient was assessed for positional comfort and pressure points before starting the procedure. Time-out: I initiated and conducted the "Time-out" before starting the procedure, as per protocol. The patient was asked to participate by confirming the accuracy of the "Time Out" information. Verification of the correct person, site, and procedure were performed and confirmed by me, the nursing staff, and the patient. "Time-out" conducted as per Joint Commission's Universal Protocol (UP.01.01.01). Time: 1249  Description of Procedure:          Laterality: Bilateral. The procedure was performed in identical fashion on both sides. Levels:  L2, L3, L4, L5, & S1 Medial Branch Level(s) Area Prepped: Posterior Lumbosacral Region Prepping solution: DuraPrep (Iodine Povacrylex [0.7% available iodine] and Isopropyl Alcohol, 74% w/w) Safety Precautions: Aspiration looking for blood return was conducted prior to all injections. At no point did we inject any substances, as a needle was being advanced. Before injecting, the patient was told to immediately notify me if she was experiencing any new onset of "ringing in the ears, or metallic taste in the mouth". No attempts were made at seeking any paresthesias. Safe injection practices and needle disposal techniques used. Medications properly checked for expiration dates. SDV (single dose vial) medications used. After the completion of the procedure, all disposable  equipment used was discarded in the proper designated medical waste containers. Local Anesthesia: Protocol guidelines were followed. The patient was positioned over the fluoroscopy table. The area was prepped in the usual manner. The time-out was completed. The target area was identified using fluoroscopy. A 12-in long, straight, sterile hemostat was used with fluoroscopic guidance  to locate the targets for each level blocked. Once located, the skin was marked with an approved surgical skin marker. Once all sites were marked, the skin (epidermis, dermis, and hypodermis), as well as deeper tissues (fat, connective tissue and muscle) were infiltrated with a small amount of a short-acting local anesthetic, loaded on a 10cc syringe with a 25G, 1.5-in  Needle. An appropriate amount of time was allowed for local anesthetics to take effect before proceeding to the next step. Local Anesthetic: Lidocaine 2.0% The unused portion of the local anesthetic was discarded in the proper designated containers. Technical explanation of process:  L2 Medial Branch Nerve Block (MBB): The target area for the L2 medial branch is at the junction of the postero-lateral aspect of the superior articular process and the superior, posterior, and medial edge of the transverse process of L3. Under fluoroscopic guidance, a Quincke needle was inserted until contact was made with os over the superior postero-lateral aspect of the pedicular shadow (target area). After negative aspiration for blood, 0.5 mL of the nerve block solution was injected without difficulty or complication. The needle was removed intact. L3 Medial Branch Nerve Block (MBB): The target area for the L3 medial branch is at the junction of the postero-lateral aspect of the superior articular process and the superior, posterior, and medial edge of the transverse process of L4. Under fluoroscopic guidance, a Quincke needle was inserted until contact was made with os over the  superior postero-lateral aspect of the pedicular shadow (target area). After negative aspiration for blood, 0.5 mL of the nerve block solution was injected without difficulty or complication. The needle was removed intact. L4 Medial Branch Nerve Block (MBB): The target area for the L4 medial branch is at the junction of the postero-lateral aspect of the superior articular process and the superior, posterior, and medial edge of the transverse process of L5. Under fluoroscopic guidance, a Quincke needle was inserted until contact was made with os over the superior postero-lateral aspect of the pedicular shadow (target area). After negative aspiration for blood, 0.5 mL of the nerve block solution was injected without difficulty or complication. The needle was removed intact. L5 Medial Branch Nerve Block (MBB): The target area for the L5 medial branch is at the junction of the postero-lateral aspect of the superior articular process and the superior, posterior, and medial edge of the sacral ala. Under fluoroscopic guidance, a Quincke needle was inserted until contact was made with os over the superior postero-lateral aspect of the pedicular shadow (target area). After negative aspiration for blood, 0.5 mL of the nerve block solution was injected without difficulty or complication. The needle was removed intact. S1 Medial Branch Nerve Block (MBB): The target area for the S1 medial branch is at the posterior and inferior 6 o'clock position of the L5-S1 facet joint. Under fluoroscopic guidance, the Quincke needle inserted for the L5 MBB was redirected until contact was made with os over the inferior and postero aspect of the sacrum, at the 6 o' clock position under the L5-S1 facet joint (Target area). After negative aspiration for blood, 0.5 mL of the nerve block solution was injected without difficulty or complication. The needle was removed intact.  Nerve block solution: 0.2% PF-Ropivacaine           The unused  portion of the solution was discarded in the proper designated containers. Procedural Needles: 22-gauge, 3.5-inch, Quincke needles used for all levels.  Once the entire procedure was completed, the treated area was cleaned,  making sure to leave some of the prepping solution back to take advantage of its long term bactericidal properties.   Illustration of the posterior view of the lumbar spine and the posterior neural structures. Laminae of L2 through S1 are labeled. DPRL5, dorsal primary ramus of L5; DPRS1, dorsal primary ramus of S1; DPR3, dorsal primary ramus of L3; FJ, facet (zygapophyseal) joint L3-L4; I, inferior articular process of L4; LB1, lateral branch of dorsal primary ramus of L1; IAB, inferior articular branches from L3 medial branch (supplies L4-L5 facet joint); IBP, intermediate branch plexus; MB3, medial branch of dorsal primary ramus of L3; NR3, third lumbar nerve root; S, superior articular process of L5; SAB, superior articular branches from L4 (supplies L4-5 facet joint also); TP3, transverse process of L3.  Vitals:   08/16/18 1244 08/16/18 1254 08/16/18 1259 08/16/18 1305  BP: 122/71 108/72 112/69 113/71  Pulse: 98 88 93 90  Resp: 18 16 (!) 21 20  Temp:      TempSrc:      SpO2: 98% 97% 98% 97%  Weight:      Height:         Start Time: 1249 hrs. End Time: 1259 hrs.  Imaging Guidance (Spinal):          Type of Imaging Technique: Fluoroscopy Guidance (Spinal) Indication(s): Assistance in needle guidance and placement for procedures requiring needle placement in or near specific anatomical locations not easily accessible without such assistance. Exposure Time: Please see nurses notes. Contrast: None used. Fluoroscopic Guidance: I was personally present during the use of fluoroscopy. "Tunnel Vision Technique" used to obtain the best possible view of the target area. Parallax error corrected before commencing the procedure. "Direction-depth-direction" technique used to  introduce the needle under continuous pulsed fluoroscopy. Once target was reached, antero-posterior, oblique, and lateral fluoroscopic projection used confirm needle placement in all planes. Images permanently stored in EMR. Interpretation: No contrast injected. I personally interpreted the imaging intraoperatively. Adequate needle placement confirmed in multiple planes. Permanent images saved into the patient's record.  Antibiotic Prophylaxis:   Anti-infectives (From admission, onward)   None     Indication(s): None identified  Post-operative Assessment:  Post-procedure Vital Signs:  Pulse/HCG Rate: 90  Temp: 98.1 F (36.7 C) Resp: 20 BP: 113/71 SpO2: 97 %  EBL: None  Complications: No immediate post-treatment complications observed by team, or reported by patient.  Note: The patient tolerated the entire procedure well. A repeat set of vitals were taken after the procedure and the patient was kept under observation following institutional policy, for this type of procedure. Post-procedural neurological assessment was performed, showing return to baseline, prior to discharge. The patient was provided with post-procedure discharge instructions, including a section on how to identify potential problems. Should any problems arise concerning this procedure, the patient was given instructions to immediately contact us, at any time, without hesitation. In any case, we plan to contact the patient by telephone for a follow-up status report regarding this interventional procedure.  Comments:  No additional relevant information.  Plan of Care  Orders:  Orders Placed This Encounter  Procedures  . LUMBAR FACET(MEDIAL BRANCH NERVE BLOCK) MBNB    Scheduling Instructions:     Side: Bilateral     Level: L3-4, L4-5, & L5-S1 Facets (L2, L3, L4, L5, & S1 Medial Branch Nerves)     Sedation: With Sedation.     Timeframe: Today    Order Specific Question:   Where will this procedure be performed?  Answer:   [facility]  . [facility] PA[facility] C-ARM 1-60 MIN NO REPORT    Intraoperative interpretation by procedural physician at [facility].    Standing Status:   Standing    Number of Occurrences:   1    Order Specific Question:   Reason for exam:    Answer:   Assistance in needle guidance and placement for procedures requiring needle placement in or near specific anatomical locations not easily accessible without such assistance.  . Provider attestation of informed consent for procedure/surgical case    I, the ordering provider, attest that I have discussed with the patient the benefits, risks, side effects, alternatives, likelihood of achieving goals and potential problems during recovery for the procedure that I have provided informed consent.    Standing Status:   Standing    Number of Occurrences:   1  . Informed Consent Details: Transcribe to consent form and obtain patient signature    Standing Status:   Standing    Number of Occurrences:   1    Order Specific Question:   Procedure    Answer:   Bilateral Lumbar facet block (medial branch block) under fluoroscopic guidance. (See notes for levels.)    Order Specific Question:   Surgeon    Answer:   Andora Krull A. Dossie Arbour, MD    Order Specific Question:   Indication/Reason    Answer:   Bilateral low back pain with or without lower extremity pain   Chronic Opioid Analgesic:  Oxycodone IR 5 mg, 1 tab PO q 6 hrs (20 mg/day of oxycodone) (Average: 2 tabs/day = 10 mg/day of oxycodone) MME/day: 15 mg/day.   Medications ordered for procedure: Meds ordered this encounter  Medications  . lidocaine (XYLOCAINE) 2 % (with pres) injection 400 mg  . DISCONTD: lactated ringers infusion 1,000 mL  . DISCONTD: midazolam (VERSED) 5 MG/5ML injection 1-2 mg    Make sure Flumazenil is available in the pyxis when using this medication. If oversedation occurs, administer 0.2 mg IV over 15 sec. If after 45 sec no response, administer 0.2 mg  again over 1 min; may repeat at 1 min intervals; not to exceed 4 doses (1 mg)  . DISCONTD: fentaNYL (SUBLIMAZE) injection 25-50 mcg    Make sure Narcan is available in the pyxis when using this medication. In the event of respiratory depression (RR< 8/min): Titrate NARCAN (naloxone) in increments of 0.1 to 0.2 mg IV at 2-3 minute intervals, until desired degree of reversal.  . ropivacaine (PF) 2 mg/mL (0.2%) (NAROPIN) injection 18 mL   Medications administered: We administered lidocaine and ropivacaine (PF) 2 mg/mL (0.2%).  See the medical record for exact dosing, route, and time of administration.  Follow-up plan:   Return for (VV), 2 wk PP-F/U Eval.      Considering:   Possible bilateral lumbar facet RFA #1  Diagnostic lumbar sympathetic block  Diagnostic right-sided intra-articular knee injection (steroid)  Diagnostic right knee Hyalgan series #1  Diagnostic right genicular nerve block  Possible right-sided genicular nerve RFA    Palliative PRN treatment(s):   Palliative bilateral lumbar facet blocks     Recent Visits Date Type Provider Dept  07/09/18 Office Visit Milinda Pointer, MD [facility]  Showing recent visits within past 90 days and meeting all other requirements   Today's Visits Date Type Provider Dept  08/16/18 Procedure visit Milinda Pointer, MD [facility]  Showing today's visits and meeting all other requirements   Future Appointments  Date Type Provider Dept  08/28/18 Appointment Milinda Pointer, MD [facility]  09/26/18 Appointment Milinda Pointer, MD [facility]  Showing future appointments within next 90 days and meeting all other requirements   Disposition: Discharge home  Discharge Date & Time: 08/16/2018; 1310 hrs.   Primary Care Physician: Patient, No Pcp Per Location: [facility] Note by: Gaspar Cola, MD Date: 08/16/2018; Time: 1:19 PM  Disclaimer:  Medicine  is not an Chief Strategy Officer. The only guarantee in medicine is that nothing is guaranteed. It is important to note that the decision to proceed with this intervention was based on the information collected from the patient. The Data and conclusions were drawn from the patient's questionnaire, the interview, and the physical examination. Because the information was provided in large part by the patient, it cannot be guaranteed that it has not been purposely or unconsciously manipulated. Every effort has been made to obtain as much relevant data as possible for this evaluation. It is important to note that the conclusions that lead to this procedure are derived in large part from the available data. Always take into account that the treatment will also be dependent on availability of resources and existing treatment guidelines, considered by other Pain Management Practitioners as being common knowledge and practice, at the time of the intervention. For Medico-Legal purposes, it is also important to point out that variation in procedural techniques and pharmacological choices are the acceptable norm. The indications, contraindications, technique, and results of the above procedure should only be interpreted and judged by a Board-Certified Interventional Pain Specialist with extensive familiarity and expertise in the same exact procedure and technique.

## 2018-08-16 NOTE — Patient Instructions (Signed)
____________________________________________________________________________________________  Post-Procedure Discharge Instructions  Instructions:  Apply ice:   Purpose: This will minimize any swelling and discomfort after procedure.   When: Day of procedure, as soon as you get home.  How: Fill a plastic sandwich bag with crushed ice. Cover it with a small towel and apply to injection site.  How long: (15 min on, 15 min off) Apply for 15 minutes then remove x 15 minutes.  Repeat sequence on day of procedure, until you go to bed.  Apply heat:   Purpose: To treat any soreness and discomfort from the procedure.  When: Starting the next day after the procedure.  How: Apply heat to procedure site starting the day following the procedure.  How long: May continue to repeat daily, until discomfort goes away.  Food intake: Start with clear liquids (like water) and advance to regular food, as tolerated.   Physical activities: Keep activities to a minimum for the first 8 hours after the procedure. After that, then as tolerated.  Driving: If you have received any sedation, be responsible and do not drive. You are not allowed to drive for 24 hours after having sedation.  Blood thinner: (Applies only to those taking blood thinners) You may restart your blood thinner 6 hours after your procedure.  Insulin: (Applies only to Diabetic patients taking insulin) As soon as you can eat, you may resume your normal dosing schedule.  Infection prevention: Keep procedure site clean and dry. Shower daily and clean area with soap and water.  Post-procedure Pain Diary: Extremely important that this be done correctly and accurately. Recorded information will be used to determine the next step in treatment. For the purpose of accuracy, follow these rules:  Evaluate only the area treated. Do not report or include pain from an untreated area. For the purpose of this evaluation, ignore all other areas of pain,  except for the treated area.  After your procedure, avoid taking a long nap and attempting to complete the pain diary after you wake up. Instead, set your alarm clock to go off every hour, on the hour, for the initial 8 hours after the procedure. Document the duration of the numbing medicine, and the relief you are getting from it.  Do not go to sleep and attempt to complete it later. It will not be accurate. If you received sedation, it is likely that you were given a medication that may cause amnesia. Because of this, completing the diary at a later time may cause the information to be inaccurate. This information is needed to plan your care.  Follow-up appointment: Keep your post-procedure follow-up evaluation appointment after the procedure (usually 2 weeks for most procedures, 6 weeks for radiofrequencies). DO NOT FORGET to bring you pain diary with you.   Expect: (What should I expect to see with my procedure?)  From numbing medicine (AKA: Local Anesthetics): Numbness or decrease in pain. You may also experience some weakness, which if present, could last for the duration of the local anesthetic.  Onset: Full effect within 15 minutes of injected.  Duration: It will depend on the type of local anesthetic used. On the average, 1 to 8 hours.   From steroids (Applies only if steroids were used): Decrease in swelling or inflammation. Once inflammation is improved, relief of the pain will follow.  Onset of benefits: Depends on the amount of swelling present. The more swelling, the longer it will take for the benefits to be seen. In some cases, up to 10 days.    Duration: Steroids will stay in the system x 2 weeks. Duration of benefits will depend on multiple posibilities including persistent irritating factors.  Side-effects: If present, they may typically last 2 weeks (the duration of the steroids).  Frequent: Cramps (if they occur, drink Gatorade and take over-the-counter Magnesium 450-500 mg  once to twice a day); water retention with temporary weight gain; increases in blood sugar; decreased immune system response; increased appetite.  Occasional: Facial flushing (red, warm cheeks); mood swings; menstrual changes.  Uncommon: Long-term decrease or suppression of natural hormones; bone thinning. (These are more common with higher doses or more frequent use. This is why we prefer that our patients avoid having any injection therapies in other practices.)   Very Rare: Severe mood changes; psychosis; aseptic necrosis.  From procedure: Some discomfort is to be expected once the numbing medicine wears off. This should be minimal if ice and heat are applied as instructed.  Call if: (When should I call?)  You experience numbness and weakness that gets worse with time, as opposed to wearing off.  New onset bowel or bladder incontinence. (Applies only to procedures done in the spine)  Emergency Numbers:  Durning business hours (Monday - Thursday, 8:00 AM - 4:00 PM) (Friday, 9:00 AM - 12:00 Noon): [phone]  After hours: [phone]  NOTE: If you are having a problem and are unable connect with, or to talk to a provider, then go to your nearest urgent care or emergency department. If the problem is serious and urgent, please call 911. ____________________________________________________________________________________________   ____________________________________________________________________________________________  Preparing for Procedure with Sedation  Procedure appointments are limited to planned procedures: . No Prescription Refills. . No disability issues will be discussed. . No medication changes will be discussed.  Instructions: . Oral Intake: Do not eat or drink anything for at least 8 hours prior to your procedure. . Transportation: Public transportation is not allowed. Bring an adult driver. The driver must be physically present in our waiting room before  any procedure can be started. . Physical Assistance: Bring an adult physically capable of assisting you, in the event you need help. This adult should keep you company at home for at least 6 hours after the procedure. . Blood Pressure Medicine: Take your blood pressure medicine with a sip of water the morning of the procedure. . Blood thinners: Notify our staff if you are taking any blood thinners. Depending on which one you take, there will be specific instructions on how and when to stop it. . Diabetics on insulin: Notify the staff so that you can be scheduled 1st case in the morning. If your diabetes requires high dose insulin, take only  of your normal insulin dose the morning of the procedure and notify the staff that you have done so. . Preventing infections: Shower with an antibacterial soap the morning of your procedure. . Build-up your immune system: Take 1000 mg of Vitamin C with every meal (3 times a day) the day prior to your procedure. . Antibiotics: Inform the staff if you have a condition or reason that requires you to take antibiotics before dental procedures. . Pregnancy: If you are pregnant, call and cancel the procedure. . Sickness: If you have a cold, fever, or any active infections, call and cancel the procedure. . Arrival: You must be in the facility at least 30 minutes prior to your scheduled procedure. . Children: Do not bring children with you. . Dress appropriately: Bring dark clothing that you would not mind   if they get stained. . Valuables: Do not bring any jewelry or valuables.  Reasons to call and reschedule or cancel your procedure: (Following these recommendations will minimize the risk of a serious complication.) . Surgeries: Avoid having procedures within 2 weeks of any surgery. (Avoid for 2 weeks before or after any surgery). . Flu Shots: Avoid having procedures within 2 weeks of a flu shots or . (Avoid for 2 weeks before or after immunizations). . Barium: Avoid  having a procedure within 7-10 days after having had a radiological study involving the use of radiological contrast. (Myelograms, Barium swallow or enema study). . Heart attacks: Avoid any elective procedures or surgeries for the initial 6 months after a "Myocardial Infarction" (Heart Attack). . Blood thinners: It is imperative that you stop these medications before procedures. Let us know if you if you take any blood thinner.  . Infection: Avoid procedures during or within two weeks of an infection (including chest colds or gastrointestinal problems). Symptoms associated with infections include: Localized redness, fever, chills, night sweats or profuse sweating, burning sensation when voiding, cough, congestion, stuffiness, runny nose, sore throat, diarrhea, nausea, vomiting, cold or Flu symptoms, recent or current infections. It is specially important if the infection is over the area that we intend to treat. [name] Heart and lung problems: Symptoms that may suggest an active cardiopulmonary problem include: cough, chest pain, breathing difficulties or shortness of breath, dizziness, ankle swelling, uncontrolled high or unusually low blood pressure, and/or palpitations. If you are experiencing any of these symptoms, cancel your procedure and contact your primary care physician for an evaluation.  Remember:  Regular Business hours are:  Monday to Thursday 8:00 AM to 4:00 PM  Provider's Schedule: Milinda Pointer, MD:  Procedure days: Tuesday and Thursday 7:30 AM to 4:00 PM  Gillis Santa, MD:  Procedure days: Monday and Wednesday 7:30 AM to 4:00 PM ____________________________________________________________________________________________  ____________________________________________________________________________________________  Preparing for your procedure (without sedation)  Procedure appointments are limited to planned procedures: . No Prescription Refills. . No disability issues will be  discussed. . No medication changes will be discussed.  Instructions: . Oral Intake: Do not eat or drink anything for at least 3 hours prior to your procedure. . Transportation: Unless otherwise stated by your physician, you may drive yourself after the procedure. . Blood Pressure Medicine: Take your blood pressure medicine with a sip of water the morning of the procedure. . Blood thinners: Notify our staff if you are taking any blood thinners. Depending on which one you take, there will be specific instructions on how and when to stop it. . Diabetics on insulin: Notify the staff so that you can be scheduled 1st case in the morning. If your diabetes requires high dose insulin, take only  of your normal insulin dose the morning of the procedure and notify the staff that you have done so. . Preventing infections: Shower with an antibacterial soap the morning of your procedure.  . Build-up your immune system: Take 1000 mg of Vitamin C with every meal (3 times a day) the day prior to your procedure. [name] Antibiotics: Inform the staff if you have a condition or reason that requires you to take antibiotics before dental procedures. . Pregnancy: If you are pregnant, call and cancel the procedure. . Sickness: If you have a cold, fever, or any active infections, call and cancel the procedure. . Arrival: You must be in the facility at least 30 minutes prior to your scheduled procedure. . Children: Do not  bring any children with you. . Dress appropriately: Bring dark clothing that you would not mind if they get stained. . Valuables: Do not bring any jewelry or valuables.  Reasons to call and reschedule or cancel your procedure: (Following these recommendations will minimize the risk of a serious complication.) . Surgeries: Avoid having procedures within 2 weeks of any surgery. (Avoid for 2 weeks before or after any surgery). . Flu Shots: Avoid having procedures within 2 weeks of a flu shots or . (Avoid for 2  weeks before or after immunizations). . Barium: Avoid having a procedure within 7-10 days after having had a radiological study involving the use of radiological contrast. (Myelograms, Barium swallow or enema study). . Heart attacks: Avoid any elective procedures or surgeries for the initial 6 months after a "Myocardial Infarction" (Heart Attack). . Blood thinners: It is imperative that you stop these medications before procedures. Let us know if you if you take any blood thinner.  . Infection: Avoid procedures during or within two weeks of an infection (including chest colds or gastrointestinal problems). Symptoms associated with infections include: Localized redness, fever, chills, night sweats or profuse sweating, burning sensation when voiding, cough, congestion, stuffiness, runny nose, sore throat, diarrhea, nausea, vomiting, cold or Flu symptoms, recent or current infections. It is specially important if the infection is over the area that we intend to treat. [name] Heart and lung problems: Symptoms that may suggest an active cardiopulmonary problem include: cough, chest pain, breathing difficulties or shortness of breath, dizziness, ankle swelling, uncontrolled high or unusually low blood pressure, and/or palpitations. If you are experiencing any of these symptoms, cancel your procedure and contact your primary care physician for an evaluation.  Remember:  Regular Business hours are:  Monday to Thursday 8:00 AM to 4:00 PM  Provider's Schedule: Milinda Pointer, MD:  Procedure days: Tuesday and Thursday 7:30 AM to 4:00 PM  Gillis Santa, MD:  Procedure days: Monday and Wednesday 7:30 AM to 4:00 PM ____________________________________________________________________________________________   ______________________________________________________________________________________________  Specialty Pain Scale  Introduction:  There are significant differences in how pain is reported. The word  pain usually refers to physical pain, but it is also a common synonym of suffering. The medical community uses a scale from 0 (zero) to 10 (ten) to report pain level. Zero (0) is described as "no pain", while ten (10) is described as "the worse pain you can imagine". The problem with this scale is that physical pain is reported along with suffering. Suffering refers to mental pain, or more often yet it refers to any unpleasant feeling, emotion or aversion associated with the perception of harm or threat of harm. It is the psychological component of pain.  Pain Specialists prefer to separate the two components. The pain scale used by this practice is the Verbal Numerical Rating Scale (VNRS-11). This scale is for the physical pain only. DO NOT INCLUDE how your pain psychologically affects you. This scale is for adults 74 years of age and older. It has 11 (eleven) levels. The 1st level is 0/10. This means: "right now, I have no pain". In the context of pain management, it also means: "right now, my physical pain is under control with the current therapy".  General Information:  The scale should reflect your current level of pain. Unless you are specifically asked for the level of your worst pain, or your average pain. If you are asked for one of these two, then it should be understood that it is over the past  24 hours.  Levels 1 (one) through 5 (five) are described below, and can be treated as an outpatient. Ambulatory pain management facilities such as ours are more than adequate to treat these levels. Levels 6 (six) through 10 (ten) are also described below, however, these must be treated as a hospitalized patient. While levels 6 (six) and 7 (seven) may be evaluated at an urgent care facility, levels 8 (eight) through 10 (ten) constitute medical emergencies and as such, they belong in a hospital's emergency department. When having these levels (as described below), do not come to our office. Our facility is not  equipped to manage these levels. Go directly to an urgent care facility or an emergency department to be evaluated.  Definitions:  Activities of Daily Living (ADL): Activities of daily living (ADL or ADLs) is a term used in healthcare to refer to people's daily self-care activities. Health professionals often use a person's ability or inability to perform ADLs as a measurement of their functional status, particularly in regard to people post injury, with disabilities and the elderly. There are two ADL levels: Basic and Instrumental. Basic Activities of Daily Living (BADL  or BADLs) consist of self-care tasks that include: Bathing and showering; personal hygiene and grooming (including brushing/combing/styling hair); dressing; Toilet hygiene (getting to the toilet, cleaning oneself, and getting back up); eating and self-feeding (not including cooking or chewing and swallowing); functional mobility, often referred to as "transferring", as measured by the ability to walk, get in and out of bed, and get into and out of a chair; the broader definition (moving from one place to another while performing activities) is useful for people with different physical abilities who are still able to get around independently. Basic ADLs include the things many people do when they get up in the morning and get ready to go out of the house: get out of bed, go to the toilet, bathe, dress, groom, and eat. On the average, loss of function typically follows a particular order. Hygiene is the first to go, followed by loss of toilet use and locomotion. The last to go is the ability to eat. When there is only one remaining area in which the person is independent, there is a 62.9% chance that it is eating and only a 3.5% chance that it is hygiene. Instrumental Activities of Daily Living (IADL or IADLs) are not necessary for fundamental functioning, but they let an individual live independently in a community. IADL consist of tasks that  include: cleaning and maintaining the house; home establishment and maintenance; care of others (including selecting and supervising caregivers); care of pets; child rearing; managing money; managing financials (investments, etc.); meal preparation and cleanup; shopping for groceries and necessities; moving within the community; safety procedures and emergency responses; health management and maintenance (taking prescribed medications); and using the telephone or other form of communication.  Instructions:  Most patients tend to report their pain as a combination of two factors, their physical pain and their psychosocial pain. This last one is also known as "suffering" and it is reflection of how physical pain affects you socially and psychologically. From now on, report them separately.  From this point on, when asked to report your pain level, report only your physical pain. Use the following table for reference.  Pain Clinic Pain Levels (0-5/10)  Pain Level Score  Description  No Pain 0   Mild pain 1 Nagging, annoying, but does not interfere with basic activities of daily living (ADL). Patients are able  to eat, bathe, get dressed, toileting (being able to get on and off the toilet and perform personal hygiene functions), transfer (move in and out of bed or a chair without assistance), and maintain continence (able to control bladder and bowel functions). Blood pressure and heart rate are unaffected. A normal heart rate for a healthy adult ranges from 60 to 100 bpm (beats per minute).   Mild to moderate pain 2 Noticeable and distracting. Impossible to hide from other people. More frequent flare-ups. Still possible to adapt and function close to normal. It can be very annoying and may have occasional stronger flare-ups. With discipline, patients may get used to it and adapt.   Moderate pain 3 Interferes significantly with activities of daily living (ADL). It becomes difficult to feed, bathe, get  dressed, get on and off the toilet or to perform personal hygiene functions. Difficult to get in and out of bed or a chair without assistance. Very distracting. With effort, it can be ignored when deeply involved in activities.   Moderately severe pain 4 Impossible to ignore for more than a few minutes. With effort, patients may still be able to manage work or participate in some social activities. Very difficult to concentrate. Signs of autonomic nervous system discharge are evident: dilated pupils (mydriasis); mild sweating (diaphoresis); sleep interference. Heart rate becomes elevated (>115 bpm). Diastolic blood pressure (lower number) rises above 100 mmHg. Patients find relief in laying down and not moving.   Severe pain 5 Intense and extremely unpleasant. Associated with frowning face and frequent crying. Pain overwhelms the senses.  Ability to do any activity or maintain social relationships becomes significantly limited. Conversation becomes difficult. Pacing back and forth is common, as getting into a comfortable position is nearly impossible. Pain wakes you up from deep sleep. Physical signs will be obvious: pupillary dilation; increased sweating; goosebumps; brisk reflexes; cold, clammy hands and feet; nausea, vomiting or dry heaves; loss of appetite; significant sleep disturbance with inability to fall asleep or to remain asleep. When persistent, significant weight loss is observed due to the complete loss of appetite and sleep deprivation.  Blood pressure and heart rate becomes significantly elevated. Caution: If elevated blood pressure triggers a pounding headache associated with blurred vision, then the patient should immediately seek attention at an urgent or emergency care unit, as these may be signs of an impending stroke.    Emergency Department Pain Levels (6-10/10)  Emergency Room Pain 6 Severely limiting. Requires emergency care and should not be seen or managed at an outpatient pain  management facility. Communication becomes difficult and requires great effort. Assistance to reach the emergency department may be required. Facial flushing and profuse sweating along with potentially dangerous increases in heart rate and blood pressure will be evident.   Distressing pain 7 Self-care is very difficult. Assistance is required to transport, or use restroom. Assistance to reach the emergency department will be required. Tasks requiring coordination, such as bathing and getting dressed become very difficult.   Disabling pain 8 Self-care is no longer possible. At this level, pain is disabling. The individual is unable to do even the most "basic" activities such as walking, eating, bathing, dressing, transferring to a bed, or toileting. Fine motor skills are lost. It is difficult to think clearly.   Incapacitating pain 9 Pain becomes incapacitating. Thought processing is no longer possible. Difficult to remember your own name. Control of movement and coordination are lost.   The worst pain imaginable 10 At this level, most  patients pass out from pain. When this level is reached, collapse of the autonomic nervous system occurs, leading to a sudden drop in blood pressure and heart rate. This in turn results in a temporary and dramatic drop in blood flow to the brain, leading to a loss of consciousness. Fainting is one of the body's self defense mechanisms. Passing out puts the brain in a calmed state and causes it to shut down for a while, in order to begin the healing process.    Summary: 1.   Refer to this scale when providing [place] with your pain level. 2.   Be accurate and careful when reporting your pain level. This will help with your care. 3.   Over-reporting your pain level will lead to loss of credibility. 4.   Even a level of 1/10 means that there is pain and will be treated at our facility. 5.   High, inaccurate reporting will be documented as "Symptom Exaggeration", leading to loss  of credibility and suspicions of possible secondary gains such as obtaining more narcotics, or wanting to appear disabled, for fraudulent reasons. 6.   Only pain levels of 5 or below will be seen at our facility. 7.   Pain levels of 6 and above will be sent to the Emergency Department and the appointment cancelled.  ______________________________________________________________________________________________

## 2018-08-16 NOTE — Progress Notes (Signed)
Safety precautions to be maintained throughout the outpatient stay will include: orient to surroundings, keep bed in low position, maintain call bell within reach at all times, provide assistance with transfer out of bed and ambulation.  

## 2018-08-17 ENCOUNTER — Telehealth: Payer: Self-pay

## 2018-08-17 NOTE — Telephone Encounter (Signed)
Post procedure phone call.  LM 

## 2018-08-21 ENCOUNTER — Inpatient Hospital Stay: Payer: 59 | Admitting: Internal Medicine

## 2018-08-23 ENCOUNTER — Other Ambulatory Visit: Payer: Self-pay

## 2018-08-23 ENCOUNTER — Inpatient Hospital Stay: Payer: 59 | Attending: Internal Medicine | Admitting: Internal Medicine

## 2018-08-23 DIAGNOSIS — C50512 Malignant neoplasm of lower-outer quadrant of left female breast: Secondary | ICD-10-CM | POA: Diagnosis not present

## 2018-08-23 DIAGNOSIS — F17210 Nicotine dependence, cigarettes, uncomplicated: Secondary | ICD-10-CM | POA: Diagnosis not present

## 2018-08-23 DIAGNOSIS — G8929 Other chronic pain: Secondary | ICD-10-CM | POA: Insufficient documentation

## 2018-08-23 DIAGNOSIS — Z794 Long term (current) use of insulin: Secondary | ICD-10-CM | POA: Diagnosis not present

## 2018-08-23 DIAGNOSIS — R5383 Other fatigue: Secondary | ICD-10-CM | POA: Diagnosis not present

## 2018-08-23 DIAGNOSIS — Z79811 Long term (current) use of aromatase inhibitors: Secondary | ICD-10-CM | POA: Diagnosis not present

## 2018-08-23 DIAGNOSIS — Z7982 Long term (current) use of aspirin: Secondary | ICD-10-CM | POA: Diagnosis not present

## 2018-08-23 DIAGNOSIS — E1142 Type 2 diabetes mellitus with diabetic polyneuropathy: Secondary | ICD-10-CM

## 2018-08-23 DIAGNOSIS — M549 Dorsalgia, unspecified: Secondary | ICD-10-CM | POA: Diagnosis not present

## 2018-08-23 DIAGNOSIS — Z803 Family history of malignant neoplasm of breast: Secondary | ICD-10-CM

## 2018-08-23 DIAGNOSIS — Z79899 Other long term (current) drug therapy: Secondary | ICD-10-CM | POA: Insufficient documentation

## 2018-08-23 DIAGNOSIS — R232 Flushing: Secondary | ICD-10-CM | POA: Diagnosis not present

## 2018-08-23 DIAGNOSIS — M2550 Pain in unspecified joint: Secondary | ICD-10-CM | POA: Diagnosis not present

## 2018-08-23 DIAGNOSIS — Z170 Estrogen receptor positive status [ER+]: Secondary | ICD-10-CM | POA: Insufficient documentation

## 2018-08-23 NOTE — Progress Notes (Signed)
[facility] CONSULT NOTE  Patient Care Team: Patient, No Pcp Per as PCP - General (General Practice) [name], [name], MD (General Surgery) [name], [name], [name] (Obstetrics and Gynecology)  CHIEF COMPLAINTS/PURPOSE OF CONSULTATION: Breast cancer  Oncology History Overview Note  # MAY 2019-  clinical stage IIIA (T3N1Mx) left breast cancer s/p biopsy on 06/14/2017. -Pathology revealed grade III invasive ductal carcinoma. -Axillary FNA revealed malignant cells c/w metastatic carcinoma. Tumor was ER + (90%), PR + (30%), Her2/neu - and Ki67 70%.  CA27.29 was 7.8 on 06/14/2017.  # She received 4 cycles of AC with Neulasta support (07/20/2017 - 08/31/2017).;  neoadjuvant Taxol on 09/14/2017.  #DEC 2019- Lumpectomy/sentinel lymph node biopsy [Dr.[name]]-complete pathologic response  # s/p RT [delayed sec to wound infection; Dr.[name]] finished RT [4/12]  # April 14th 2020- START TAM; stopped in mid-May secondary intolerance [severe migraines].  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 Secord/Berchuck]-negative for malignancy/ # DM-2- poorly controlled.   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  -------------------------------------------  DIAGNOSIS: left breast cancer  STAGE:  III       ;GOALS: cure  CURRENT/MOST RECENT THERAPY Tam    Cancer of midline of breast, left ([place])  06/15/2017 Initial Diagnosis   Cancer of midline of breast, left ([place])   06/26/2017 -  Chemotherapy   The patient had DOXOrubicin (ADRIAMYCIN) chemo injection 122 mg, 60 mg/m2 = 122 mg, Intravenous,  Once, 4 of 4 cycles Administration: 122 mg (07/20/2017), 122 mg (08/03/2017), 122 mg (08/17/2017), 122 mg (08/31/2017) palonosetron (ALOXI) injection 0.25 mg, 0.25  mg, Intravenous,  Once, 4 of 4 cycles Administration: 0.25 mg (07/20/2017), 0.25 mg (08/03/2017), 0.25 mg (08/17/2017), 0.25 mg (08/31/2017) pegfilgrastim (NEULASTA) injection 6 mg, 6 mg, Subcutaneous, Once, 5 of 5 cycles Administration: 6 mg (07/21/2017), 6 mg (08/04/2017), 6 mg (08/18/2017), 6 mg (09/01/2017) cyclophosphamide (CYTOXAN) 1,220 mg in sodium chloride 0.9 % 250 mL chemo infusion, 600 mg/m2 = 1,220 mg, Intravenous,  Once, 4 of 4 cycles Administration: 1,220 mg (07/20/2017), 1,220 mg (08/03/2017), 1,220 mg (08/17/2017), 1,220 mg (08/31/2017) PACLitaxel (TAXOL) 162 mg in sodium chloride 0.9 % 250 mL chemo infusion (</= 63m/m2), 80 mg/m2 = 162 mg, Intravenous,  Once, 10 of 12 cycles Dose modification: 65 mg/m2 (original dose 80 mg/m2, Cycle 13, Reason: Provider Judgment, Comment: neuropathy) Administration: 162 mg (09/14/2017), 162 mg (09/21/2017), 162 mg (09/28/2017), 162 mg (10/05/2017), 162 mg (10/12/2017), 162 mg (10/19/2017), 162 mg (10/26/2017), 132 mg (11/02/2017), 132 mg (11/09/2017), 132 mg (11/16/2017) fosaprepitant (EMEND) 150 mg, dexamethasone (DECADRON) 12 mg in sodium chloride 0.9 % 145 mL IVPB, , Intravenous,  Once, 4 of 4 cycles Administration:  (07/20/2017),  (08/03/2017),  (08/17/2017),  (08/31/2017)  for chemotherapy treatment.    Malignant neoplasm of lower-outer quadrant of left breast of female, estrogen receptor positive ([place]  11/08/2017 Initial Diagnosis   Malignant neoplasm of lower-outer quadrant of left breast of female, estrogen receptor positive ([place]     HISTORY OF PRESENTING ILLNESS:  CMaygen SiricoWhite 53y.o.  female with a history of stage III ER PR positive HER-2/neu negative breast cancer is here for follow-up.  In the interim patient had Zometa infusion noted to have body aches/fever up to 102; which started the evening of infusion.  Improved with Tylenol/Benadryl.  Patient continues to have  chronic joint pains back pain not any worse.  Intermittent hot flashes.  No new  lumps or bumps.  Appetite is good.  Review of Systems  Constitutional: Positive for malaise/fatigue. Negative for chills, diaphoresis, fever and weight loss.  HENT: Negative for nosebleeds and sore throat.   Eyes: Negative for double vision.  Respiratory: Negative for cough, hemoptysis, sputum production, shortness of breath and wheezing.   Cardiovascular: Negative for chest pain, palpitations, orthopnea and leg swelling.  Gastrointestinal: Negative for abdominal pain, blood in stool, constipation, diarrhea, heartburn, melena, nausea and vomiting.  Genitourinary: Negative for dysuria, frequency and urgency.  Musculoskeletal: Positive for joint pain. Negative for back pain.  Skin: Negative.  Negative for itching and rash.  Neurological: Positive for tingling. Negative for dizziness, focal weakness, weakness and headaches.  Endo/Heme/Allergies: Does not bruise/bleed easily.  Psychiatric/Behavioral: Negative for depression. The patient is not nervous/anxious and does not have insomnia.      MEDICAL HISTORY:  Past Medical History:  Diagnosis Date  . Cancer ([place]) 06/15/2017   5.1 cm, T3,N1 (clinical): ER/ PR positive, Her 2 neu not overexpressed, High Ki 67. Neuoadjuvant chemotherapy.   . Depression   . Diabetes mellitus without complication ([place]) 8110  . Endometriosis   . Family history of breast cancer   . Headache    migraines  . Hyperlipidemia   . Ovarian mass   . Pneumonia    2018    SURGICAL HISTORY: Past Surgical History:  Procedure Laterality Date  . AXILLARY LYMPH NODE BIOPSY Left 07/14/2017   Procedure: INSERTION GEL MARK CLIP LEFT AXILLA;  Surgeon: Robert Bellow, MD;  Location: [facility] ORS;  Service: General;  Laterality: Left;  . BREAST BIOPSY Left    Dr Orlene Och BREAST METASTATIC CARCINOMA  . BREAST LUMPECTOMY Left 01/12/2018  . OOPHORECTOMY    . PARTIAL MASTECTOMY WITH NEEDLE LOCALIZATION Left 01/12/2018   Procedure: PARTIAL MASTECTOMY WITH NEEDLE LOCALIZATION;   Surgeon: Robert Bellow, MD;  Location: [facility] ORS;  Service: General;  Laterality: Left;  . PORTACATH PLACEMENT Right 07/14/2017   Procedure: INSERTION PORT-A-CATH;  Surgeon: Robert Bellow, MD;  Location: [facility] ORS;  Service: General;  Laterality: Right;  . SENTINEL NODE BIOPSY Left 01/12/2018   Procedure: SENTINEL NODE BIOPSY;  Surgeon: Robert Bellow, MD;  Location: [facility] ORS;  Service: General;  Laterality: Left;  . TUBAL LIGATION      SOCIAL HISTORY: Social History   Socioeconomic History  . Marital status: Single    Spouse name: Not on file  . Number of children: Not on file  . Years of education: Not on file  . Highest education level: Not on file  Occupational History  . Not on file  Social Needs  . Financial resource strain: Not on file  . Food insecurity    Worry: Not on file    Inability: Not on file  . Transportation needs    Medical: Not on file    Non-medical: Not on file  Tobacco Use  . Smoking status: Current Every Day Smoker    Packs/day: 0.50    Years: 11.00    Pack years: 5.50    Types: Cigarettes  . Smokeless tobacco: Former Systems developer    Types: Snuff  Substance and Sexual Activity  . Alcohol use: No    Alcohol/week: 0.0 standard drinks  . Drug use: No  . Sexual activity: Yes  Lifestyle  . Physical activity    Days per week: Not on file    Minutes  per session: Not on file  . Stress: Not on file  Relationships  . Social Herbalist on phone: Not on file    Gets together: Not on file    Attends religious service: Not on file    Active member of club or organization: Not on file    Attends meetings of clubs or organizations: Not on file    Relationship status: Not on file  . Intimate partner violence    Fear of current or ex partner: Not on file    Emotionally abused: Not on file    Physically abused: Not on file    Forced sexual activity: Not on file  Other Topics Concern  . Not on file  Social History Narrative  . Not on file     FAMILY HISTORY: Family History  Problem Relation Age of Onset  . Other Father        No info about father or paternal relatives  . Diabetes Brother   . Pancreatitis Brother   . Prostate cancer Brother 56       currently 73 / maternal half-brother  . Breast cancer Maternal Grandmother 40       deceased 90s  . Breast cancer Maternal Aunt 65       currently 53  . Breast cancer Other 69       mother's sister; deceased 30  . Breast cancer Other        mother's sister; age at dx unknown    ALLERGIES:  is allergic to aspirin.  MEDICATIONS:  Current Outpatient Medications  Medication Sig Dispense Refill  . albuterol (PROVENTIL HFA;VENTOLIN HFA) 108 (90 Base) MCG/ACT inhaler Inhale 2 puffs into the lungs every 6 (six) hours as needed for wheezing or shortness of breath. 1 Inhaler 0  . anastrozole (ARIMIDEX) 1 MG tablet Take 1 tablet (1 mg total) by mouth daily. 30 tablet 3  . aspirin-acetaminophen-caffeine (EXCEDRIN MIGRAINE) 250-250-65 MG tablet Take 2 tablets by mouth daily as needed for headache.    . Blood Glucose Monitoring Suppl (FIFTY50 GLUCOSE METER 2.0) w/Device KIT Use as directed    . canagliflozin (INVOKANA) 300 MG TABS tablet Take 300 mg by mouth daily.    . Cholecalciferol (VITAMIN D3) 125 MCG (5000 UT) CAPS Take 1 capsule (5,000 Units total) by mouth daily with breakfast. Take along with calcium and magnesium. 100 capsule 0  . gabapentin (NEURONTIN) 300 MG capsule Take 3 capsules (900 mg total) by mouth 4 (four) times daily. 1200 capsule 0  . glucose blood (ONE TOUCH ULTRA TEST) test strip Use up to 4 times/day 100 each 12  . goserelin (ZOLADEX) 3.6 MG injection Inject 3.6 mg into the skin every 28 (twenty-eight) days.    . Insulin Glargine (BASAGLAR KWIKPEN) 100 UNIT/ML SOPN INJECT 0.24 MLS (24 UNITS TOTAL) INTO THE SKIN AT BEDTIME. (Patient taking differently: Inject 44 Units into the skin at bedtime. ) 15 pen 2  . Insulin Pen Needle 32G X 4 MM MISC 1 Units by Does not  apply route every morning. Pen needles 90 each 3  . lidocaine-prilocaine (EMLA) cream Apply to affected area once 30 g 3  . Magnesium 500 MG CAPS Take 1 capsule (500 mg total) by mouth 2 (two) times daily at 8 am and 10 pm. 200 capsule 0  . ondansetron (ZOFRAN) 8 MG tablet Take 1 tablet (8 mg total) by mouth 2 (two) times daily as needed. Start on the third day after chemotherapy. [place]  tablet 1  . prochlorperazine (COMPAZINE) 10 MG tablet Take 1 tablet (10 mg total) by mouth every 6 (six) hours as needed (Nausea or vomiting). 30 tablet 1  . oxyCODONE (OXY IR/ROXICODONE) 5 MG immediate release tablet Take 1-2 tablets (5-10 mg total) by mouth every 6 (six) hours as needed for up to 30 days for severe pain. Must last 30 days 60 tablet 0   No current facility-administered medications for this visit.    Facility-Administered Medications Ordered in Other Visits  Medication Dose Route Frequency Provider Last Rate Last Dose  . heparin lock flush 100 unit/mL  500 Units Intravenous Once Corcoran, Melissa C, MD      . sodium chloride flush (NS) 0.9 % injection 10 mL  10 mL Intravenous Once Corcoran, Melissa C, MD          .  PHYSICAL EXAMINATION: ECOG PERFORMANCE STATUS: 0 - Asymptomatic  Vitals:   08/23/18 1057  BP: 112/70  Pulse: 81  Resp: 18  Temp: 97.7 F (36.5 C)   Filed Weights   08/23/18 1057  Weight: 188 lb 3.2 oz (85.4 kg)    Physical Exam  Constitutional: She is oriented to person, place, and time and well-developed, well-nourished, and in no distress.  HENT:  Head: Normocephalic and atraumatic.  Mouth/Throat: Oropharynx is clear and moist. No oropharyngeal exudate.  Eyes: Pupils are equal, round, and reactive to light.  Neck: Normal range of motion. Neck supple.  Cardiovascular: Normal rate and regular rhythm.  Pulmonary/Chest: No respiratory distress. She has no wheezes.  Abdominal: Soft. Bowel sounds are normal. She exhibits no distension and no mass. There is no abdominal  tenderness. There is no rebound and no guarding.  Musculoskeletal: Normal range of motion.        General: No tenderness or edema.  Neurological: She is alert and oriented to person, place, and time.  Skin: Skin is warm.  Psychiatric: Affect normal.     LABORATORY DATA:  I have reviewed the data as listed Lab Results  Component Value Date   WBC 6.2 05/22/2018   HGB 14.8 05/22/2018   HCT 45.8 05/22/2018   MCV 94.0 05/22/2018   PLT 227 05/22/2018   Recent Labs    11/23/17 1101 01/22/18 1200 02/02/18 0003 05/22/18 1146 07/18/18 1113  NA 138 139 135 139 138  K 3.8 4.2 3.8 3.7 3.7  CL 102 97 102 104 104  CO2 25  --  '25 26 25  ' GLUCOSE 226* 307* 337* 194* 312*  BUN '11 8 9 12 8  ' CREATININE 0.53 0.51* 0.46 0.56 0.61  CALCIUM 9.6 9.7 9.1 9.5 8.3*  GFRNONAA >60 112 >60 >60 >60  GFRAA >60 130 >60 >60 >60  PROT 6.3* 6.4 7.2 7.7  --   ALBUMIN 3.8 4.1 3.9 4.2  --   AST '20 10 15 16  ' --   ALT 27  --  14 18  --   ALKPHOS 78 103 89 94  --   BILITOT 0.9 0.4 0.5 0.6  --     RADIOGRAPHIC STUDIES: I have personally reviewed the radiological images as listed and agreed with the findings in the report. [facility] C-arm 1-60 Min No Report  Result Date: 08/16/2018 Fluoro was used, but no Radiologist interpretation will be provided. Please refer to "NOTES" tab for provider progress note.   ASSESSMENT & PLAN:   Malignant neoplasm of lower-outer quadrant of left breast of female, estrogen receptor positive ([place]) # breast cancer Stage III  ER/PR pos her 2 NEG. currently on anastrazole+ Zoldadex.  Stable.  #Continue current therapy ; tolerating fairly well except for continued joint pains.    # Joint pains- on vit D/continue Osteo Bi-Flex/Neurontin.  # ? Infusion reaction to Zometa- "102.4 fever/ joint pains/resolved with tylenol.  Monitor for now.  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  Stable  # DM-2 on insulin-stable  # DISPOSITION: #Follow-up in 1st week of  September- MD- cbc/cmp/ca-27-29;Dr.B    All questions were answered. The patient knows to call the clinic with any problems, questions or concerns.    Cammie Sickle, MD 09/06/2018 9:07 PM

## 2018-08-23 NOTE — Assessment & Plan Note (Addendum)
#   breast cancer Stage III ER/PR pos her 2 NEG. currently on anastrazole+ Zoldadex.  Stable.  #Continue current therapy ; tolerating fairly well except for continued joint pains.    # Joint pains- on vit D/continue Osteo Bi-Flex/Neurontin.  # ? Infusion reaction to Zometa- "102.4 fever/ joint pains/resolved with tylenol.  Monitor for now.  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  Stable  # DM-2 on insulin-stable  # DISPOSITION: #Follow-up in 1st week of September- MD- cbc/cmp/ca-27-29;Dr.B

## 2018-08-23 NOTE — Progress Notes (Signed)
Patient reports that the Zometa infusion made her sick with fever of 102.4.  Since the Zometa she is having new joint pain worse in hands and feet.  Last received Zoladex on 06/25/18.

## 2018-08-26 NOTE — Progress Notes (Signed)
Pain Management Virtual Encounter Note - Virtual Visit via Telephone Telehealth (real-time audio visits between healthcare provider and patient).   Patient's Phone No. & Preferred Pharmacy:  [phone] (home); [phone] (mobile); (Preferred) [phone] [email or web address]  [id number] - [place], [place] - [address] [address] Phone: [phone] Fax: [phone]    Pre-screening note:  Our staff contacted Ms. White and offered her an "in person", "face-to-face" appointment versus a telephone encounter. She indicated preferring the telephone encounter, at this time.   Reason for Virtual Visit: COVID-19*  Social distancing based on CDC and AMA recommendations.   I contacted [name] on 08/28/2018 via telephone.      I clearly identified myself as Gaspar Cola, MD. I verified that I was speaking with the correct person using two identifiers (Name: JAHLIYAH TRICE, and date of birth: July 16, 1965).  Advanced Informed Consent I sought verbal advanced consent from [name] for virtual visit interactions. I informed Ms. White of possible security and privacy concerns, risks, and limitations associated with providing "not-in-person" medical evaluation and management services. I also informed Ms. White of the availability of "in-person" appointments. Finally, I informed her that there would be a charge for the virtual visit and that she could be  personally, fully or partially, financially responsible for it. [name] expressed understanding and agreed to proceed.   Historic Elements   Ms. JANASHA BARKALOW is a 53 y.o. year old, female patient evaluated today after her last encounter by our practice on 08/17/2018. Ms. White  has a past medical history of Cancer ([place]) (06/15/2017), Depression, Diabetes mellitus without complication ([place]) (5852), Endometriosis, Family history of breast cancer, Headache, Hyperlipidemia, Ovarian mass, and  Pneumonia. She also  has a past surgical history that includes Oophorectomy; Tubal ligation; Portacath placement (Right, 07/14/2017); Axillary lymph node biopsy (Left, 07/14/2017); Breast lumpectomy (Left, 01/12/2018); Breast biopsy (Left); Partial mastectomy with needle localization (Left, 01/12/2018); and Sentinel node biopsy (Left, 01/12/2018). [name] has a current medication list which includes the following prescription(s): albuterol, anastrozole, aspirin-acetaminophen-caffeine, fifty50 glucose meter 2.0, canagliflozin, vitamin d3, gabapentin, glucose blood, goserelin, basaglar kwikpen, insulin pen needle, lidocaine-prilocaine, magnesium, ondansetron, oxycodone, and prochlorperazine, and the following Facility-Administered Medications: heparin lock flush and sodium chloride flush. She  reports that she has been smoking cigarettes. She has a 5.50 pack-year smoking history. She has quit using smokeless tobacco.  Her smokeless tobacco use included snuff. She reports that she does not drink alcohol or use drugs. [name] is allergic to aspirin.   HPI  Today, she is being contacted for a post-procedure assessment.  The patient indicates having done great after her first diagnostic bilateral lumbar facet block under fluoroscopic guidance, no sedation.  She currently indicates having minimal pain and mostly because she has being standing all day long at work.  At this point, this confirms that we are in fact dealing with a bilateral lumbar facet syndrome that seems to respond to medial branch blockade.  The plan is to continue observing how she does and as soon as the pain returns we will document how long this block lasted.  Should it continue to return, we will go ahead and do a second diagnostic injection.  If the second diagnostic injection again provides her with good relief of the pain, but the pain continues to come back, we will explore the possibility about radiofrequency ablation.  Today I have mentioned  this possibility to the patient and I have  explained to her what it involves.  However, for the time being we do not have to worry about this since she is doing well.  Post-Procedure Evaluation  Procedure: Diagnostic bilateral lumbar facet block #1 under fluoroscopic guidance, no sedation Pre-procedure pain level:  5/10 Post-procedure: 0/10 (100% relief)  Sedation: None.  Effectiveness during initial hour after procedure(Ultra-Short Term Relief): 100 %   Local anesthetic used: Long-acting (4-6 hours) Effectiveness: Defined as any analgesic benefit obtained secondary to the administration of local anesthetics. This carries significant diagnostic value as to the etiological location, or anatomical origin, of the pain. Duration of benefit is expected to coincide with the duration of the local anesthetic used.  Effectiveness during initial 4-6 hours after procedure(Short-Term Relief): 100 %   Long-term benefit: Defined as any relief past the pharmacologic duration of the local anesthetics.  Effectiveness past the initial 6 hours after procedure(Long-Term Relief): 80 %   Current benefits: Defined as benefit that persist at this time.   Analgesia:  90-100% better Function: [name] reports improvement in function ROM: [name] reports improvement in ROM  Pharmacotherapy Assessment  Analgesic: Oxycodone IR 5 mg, 1 tab PO q 6 hrs (20 mg/day of oxycodone) (Average: 2 tabs/day = 10 mg/day of oxycodone) MME/day: 15 mg/day.   Monitoring: Pharmacotherapy: No side-effects or adverse reactions reported. [place] PMP: PDMP reviewed during this encounter.       Compliance: No problems identified. Effectiveness: Clinically acceptable. Plan: Refer to "POC".  Pertinent Labs   SAFETY SCREENING Profile Lab Results  Component Value Date   SARSCOV2NAA NEGATIVE 08/13/2018   HIV Non Reactive 07/01/2015   PREGTESTUR NEGATIVE 01/12/2018   Renal Function Lab Results  Component Value Date   BUN 8  07/18/2018   CREATININE 0.61 07/18/2018   BCR 16 01/22/2018   GFRAA >60 07/18/2018   GFRNONAA >60 07/18/2018   Hepatic Function Lab Results  Component Value Date   AST 16 05/22/2018   ALT 18 05/22/2018   ALBUMIN 4.2 05/22/2018   UDS Summary  Date Value Ref Range Status  01/22/2018 FINAL  Final    Comment:    ==================================================================== TOXASSURE COMP DRUG ANALYSIS,UR ==================================================================== Test                             Result       Flag       Units Drug Present and Declared for Prescription Verification   Norhydrocodone                 255          EXPECTED   ng/mg creat    Norhydrocodone is an expected metabolite of hydrocodone.   Tramadol                       5912         EXPECTED   ng/mg creat   O-Desmethyltramadol            7009         EXPECTED   ng/mg creat   N-Desmethyltramadol            1209         EXPECTED   ng/mg creat    Source of tramadol is a prescription medication.    O-desmethyltramadol and N-desmethyltramadol are expected    metabolites of tramadol.   Gabapentin  PRESENT      EXPECTED   Acetaminophen                  PRESENT      EXPECTED Drug Present not Declared for Prescription Verification   Alcohol, Ethyl                 0.242        UNEXPECTED g/dL    Sources of ethyl alcohol include alcoholic beverages or as a    fermentation product of glucose; glucose is present in this    specimen.  Interpret result with caution, as the presence of    ethyl alcohol is likely due, at least in part, to fermentation of    glucose.   Naproxen                       PRESENT      UNEXPECTED Drug Absent but Declared for Prescription Verification   Hydrocodone                    Not Detected UNEXPECTED ng/mg creat    Hydrocodone is almost always present in patients taking this drug    consistently. Absence of hydrocodone could be due to lapse of    time since  the last dose or unusual pharmacokinetics (rapid    metabolism).   Prochlorperazine               Not Detected UNEXPECTED   Salicylate                     Not Detected UNEXPECTED    Aspirin, as indicated in the declared medication list, is not    always detected even when used as directed.   Lidocaine                      Not Detected UNEXPECTED    Lidocaine, as indicated in the declared medication list, is not    always detected even when used as directed. ==================================================================== Test                      Result    Flag   Units      Ref Range   Creatinine              33               mg/dL      >=20 ==================================================================== Declared Medications:  The flagging and interpretation on this report are based on the  following declared medications.  Unexpected results may arise from  inaccuracies in the declared medications.  **Note: The testing scope of this panel includes these medications:  Gabapentin (Neurontin)  Hydrocodone (Norco)  Prochlorperazine (Compazine)  Tramadol (Ultram)  **Note: The testing scope of this panel does not include small to  moderate amounts of these reported medications:  Acetaminophen (Excedrin)  Acetaminophen (Norco)  Aspirin (Excedrin)  Lidocaine (Lidocaine-Prilocaine)  **Note: The testing scope of this panel does not include following  reported medications:  Albuterol  Caffeine (Excedrin)  Canagliflozin (Invokana)  Insulin  Ondansetron (Zofran)  Prilocaine (Lidocaine-Prilocaine)  Vitamin D3 ==================================================================== For clinical consultation, please call [phone]. ====================================================================    Note: Above Lab results reviewed.  Recent imaging  DG PAIN CLINIC C-ARM 1-60 MIN NO REPORT Fluoro was used, but no Radiologist interpretation will be provided.  Please refer to  "NOTES" tab for provider  progress note.      Assessment  The primary encounter diagnosis was Lumbar facet syndrome (Bilateral) (L>R). Diagnoses of Chronic pain syndrome, Chronic feet pain (Primary Area of Pain) (Bilateral) (R>L), Chronic knee pain (Secondary Area of Pain) (Bilateral) (R>L), Chronic upper extremity pain (Tertiary Area of Pain) (Bilateral) (R>L), Chronic hand pain (Tertiary Area of Pain) (Bilateral) (R>L), and Chronic hip pain (Fourth Area of Pain) (Bilateral) (R>L) were also pertinent to this visit.  Plan of Care  I have discontinued Barnetta [place] A. White's metFORMIN. I am also having her maintain her glucose blood, Insulin Pen Needle, albuterol, Basaglar KwikPen, ondansetron, prochlorperazine, lidocaine-prilocaine, canagliflozin, aspirin-acetaminophen-caffeine, Fifty50 Glucose Meter 2.0, anastrozole, goserelin, gabapentin, oxyCODONE, Vitamin D3, and Magnesium.  Pharmacotherapy (Medications Ordered): No orders of the defined types were placed in this encounter.  Orders:  No orders of the defined types were placed in this encounter.  Follow-up plan:   No follow-ups on file.      Considering:   Possible bilateral lumbar facet RFA #1  Diagnostic lumbar sympathetic block  Diagnostic right-sided intra-articular knee injection (steroid)  Diagnostic right knee Hyalgan series #1  Diagnostic right genicular nerve block  Possible right-sided genicular nerve RFA    Palliative PRN treatment(s):   Palliative bilateral lumbar facet blocks      Recent Visits Date Type Provider Dept  08/16/18 Procedure visit Milinda Pointer, MD [facility]  07/09/18 Office Visit Milinda Pointer, MD [facility]  Showing recent visits within past 90 days and meeting all other requirements   Today's Visits Date Type Provider Dept  08/28/18 Office Visit Milinda Pointer, MD [facility]  Showing today's visits and meeting all other requirements   Future  Appointments Date Type Provider Dept  09/26/18 Appointment Milinda Pointer, MD [facility]  Showing future appointments within next 90 days and meeting all other requirements   I discussed the assessment and treatment plan with the patient. The patient was provided an opportunity to ask questions and all were answered. The patient agreed with the plan and demonstrated an understanding of the instructions.  Patient advised to call back or seek an in-person evaluation if the symptoms or condition worsens.  Total duration of non-face-to-face encounter: 12 minutes.  Note by: Gaspar Cola, MD Date: 08/28/2018; Time: 2:41 PM  Note: This dictation was prepared with Dragon dictation. Any transcriptional errors that may result from this process are unintentional.  Disclaimer:  * Given the special circumstances of the COVID-19 pandemic, the federal government has announced that the Office for Civil Rights (OCR) will exercise its enforcement discretion and will not impose penalties on physicians using telehealth in the event of noncompliance with regulatory requirements under the [place] (HIPAA) in connection with the good faith provision of telehealth during the KWIOX-73 national public health emergency. ([place])

## 2018-08-27 ENCOUNTER — Telehealth: Payer: Self-pay | Admitting: *Deleted

## 2018-08-27 ENCOUNTER — Other Ambulatory Visit: Payer: Self-pay

## 2018-08-27 DIAGNOSIS — Z170 Estrogen receptor positive status [ER+]: Secondary | ICD-10-CM

## 2018-08-27 DIAGNOSIS — C50512 Malignant neoplasm of lower-outer quadrant of left female breast: Secondary | ICD-10-CM

## 2018-08-27 NOTE — Telephone Encounter (Signed)
Left voicemail with patient to please call to go over medication list prior to VV appt 08/28/18 with Dr Dossie Arbour.

## 2018-08-27 NOTE — Telephone Encounter (Signed)
Patient called and stated that she saw Dr.Brahamandy and he wants her to get a mammogram scheduled and then come back to see another provider, patient normally sees Dr.Pabon

## 2018-08-27 NOTE — Telephone Encounter (Signed)
The patient is scheduled for her mammogram at [facility] on 09/07/18 at 11:20 am. She has seen Dr Hampton Abbot in the past and we will schedule her with him for her follow up exam.

## 2018-08-28 ENCOUNTER — Ambulatory Visit: Payer: 59 | Attending: Pain Medicine | Admitting: Pain Medicine

## 2018-08-28 ENCOUNTER — Other Ambulatory Visit: Payer: Self-pay

## 2018-08-28 ENCOUNTER — Encounter: Payer: Self-pay | Admitting: Pain Medicine

## 2018-08-28 DIAGNOSIS — M25551 Pain in right hip: Secondary | ICD-10-CM

## 2018-08-28 DIAGNOSIS — G894 Chronic pain syndrome: Secondary | ICD-10-CM | POA: Diagnosis not present

## 2018-08-28 DIAGNOSIS — M47816 Spondylosis without myelopathy or radiculopathy, lumbar region: Secondary | ICD-10-CM

## 2018-08-28 DIAGNOSIS — M25561 Pain in right knee: Secondary | ICD-10-CM | POA: Diagnosis not present

## 2018-08-28 DIAGNOSIS — M79602 Pain in left arm: Secondary | ICD-10-CM

## 2018-08-28 DIAGNOSIS — M79641 Pain in right hand: Secondary | ICD-10-CM

## 2018-08-28 DIAGNOSIS — M25562 Pain in left knee: Secondary | ICD-10-CM

## 2018-08-28 DIAGNOSIS — M79642 Pain in left hand: Secondary | ICD-10-CM

## 2018-08-28 DIAGNOSIS — M79672 Pain in left foot: Secondary | ICD-10-CM

## 2018-08-28 DIAGNOSIS — M79671 Pain in right foot: Secondary | ICD-10-CM

## 2018-08-28 DIAGNOSIS — G8929 Other chronic pain: Secondary | ICD-10-CM

## 2018-08-28 DIAGNOSIS — M79601 Pain in right arm: Secondary | ICD-10-CM

## 2018-08-28 DIAGNOSIS — M25552 Pain in left hip: Secondary | ICD-10-CM

## 2018-09-05 ENCOUNTER — Inpatient Hospital Stay: Payer: 59

## 2018-09-07 ENCOUNTER — Ambulatory Visit
Admission: RE | Admit: 2018-09-07 | Discharge: 2018-09-07 | Disposition: A | Discharge status: Home / Self Care | Payer: 59 | Source: Ambulatory Visit | Attending: Surgery | Admitting: Surgery

## 2018-09-07 ENCOUNTER — Other Ambulatory Visit: Payer: Self-pay

## 2018-09-07 DIAGNOSIS — C50512 Malignant neoplasm of lower-outer quadrant of left female breast: Secondary | ICD-10-CM

## 2018-09-07 DIAGNOSIS — Z170 Estrogen receptor positive status [ER+]: Secondary | ICD-10-CM | POA: Insufficient documentation

## 2018-09-07 HISTORY — DX: Personal history of antineoplastic chemotherapy: Z92.21

## 2018-09-07 HISTORY — DX: Malignant neoplasm of unspecified site of unspecified female breast: C50.919

## 2018-09-07 HISTORY — DX: Personal history of irradiation: Z92.3

## 2018-09-07 IMAGING — MG DIGITAL DIAGNOSTIC BILATERAL MAMMOGRAM WITH TOMO AND CAD
6 of 9 series · 6 of 25 positions shown · non-contrast
Comparison: Previous exam(s).

CLINICAL DATA: History of treated left breast cancer status post
breast conservation therapy in [S0].

EXAM:
DIGITAL DIAGNOSTIC BILATERAL MAMMOGRAM WITH CAD AND TOMO

[L CC]
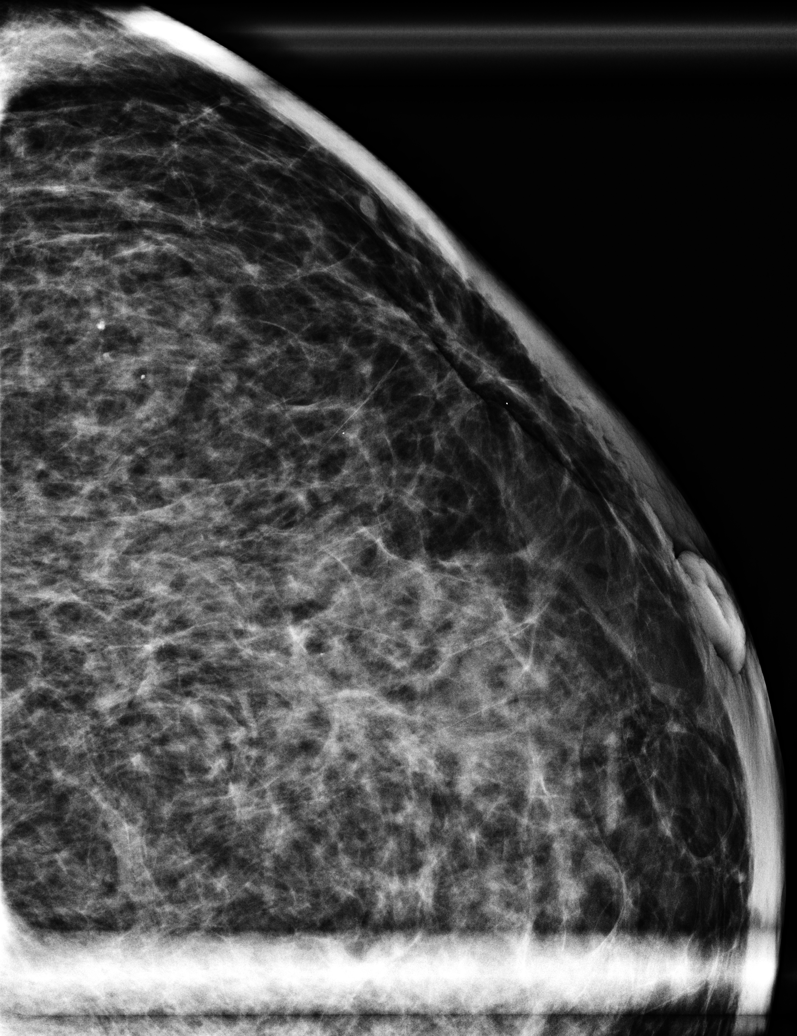

[L MLO synth-2D]
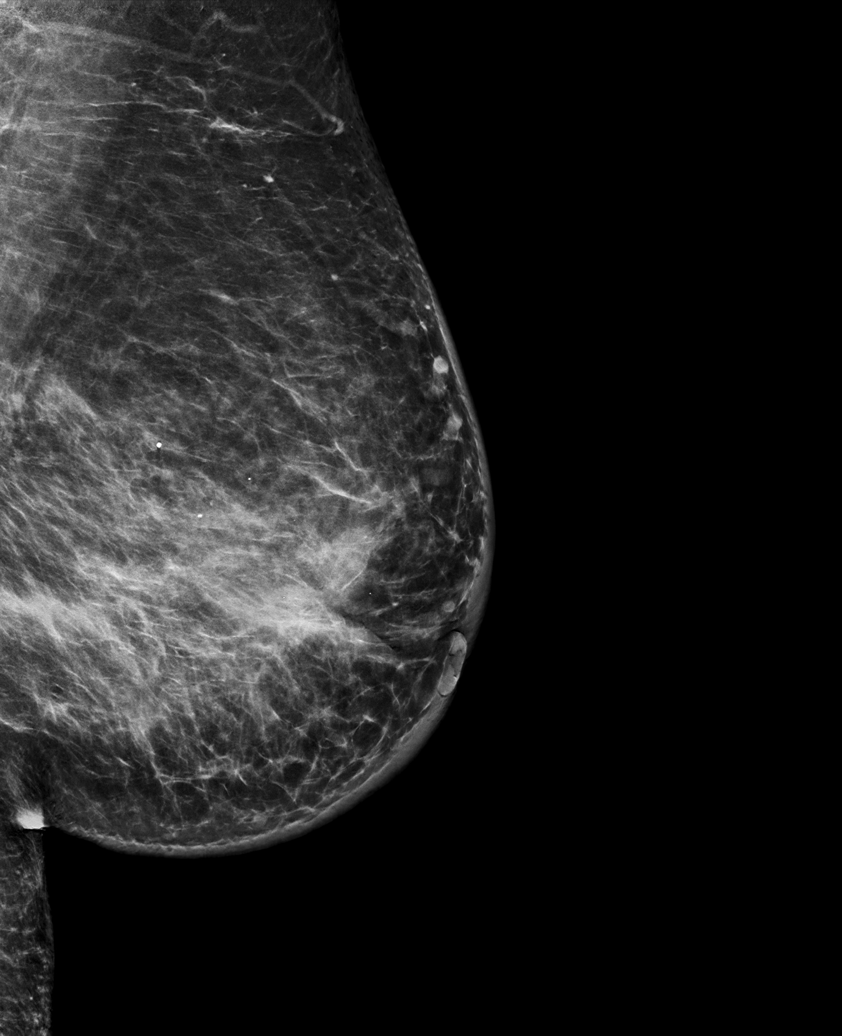

[L CC synth-2D]
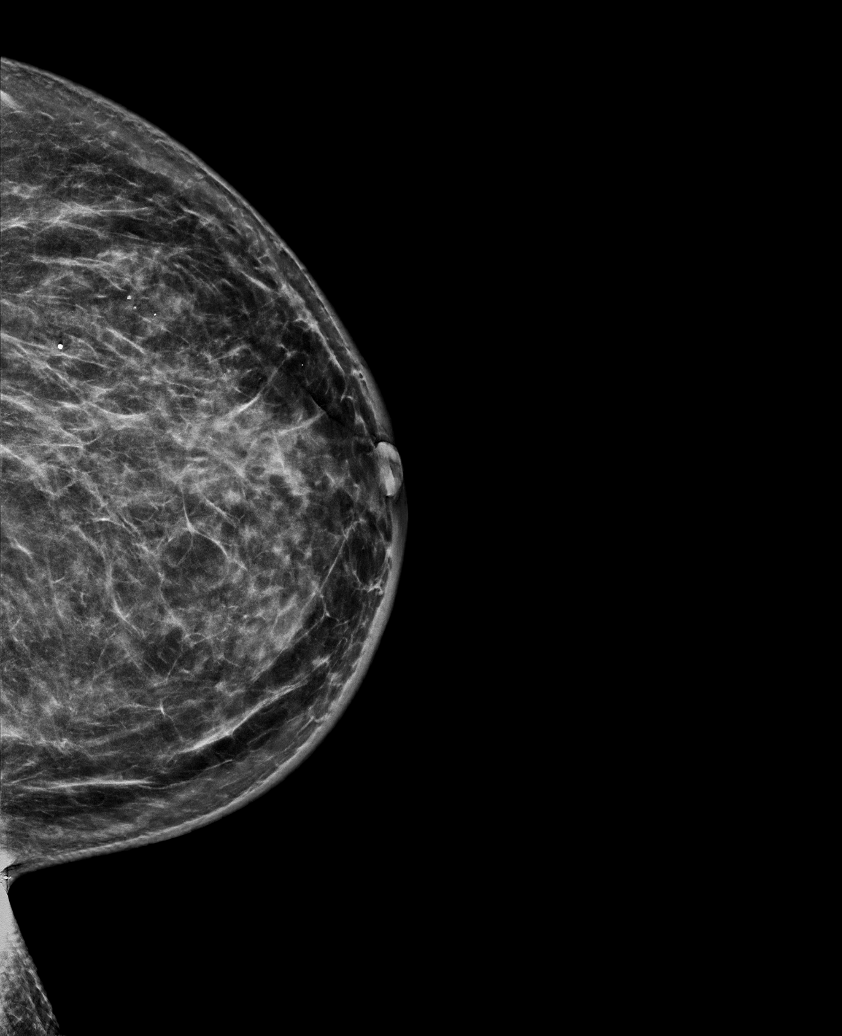

[R CC synth-2D]
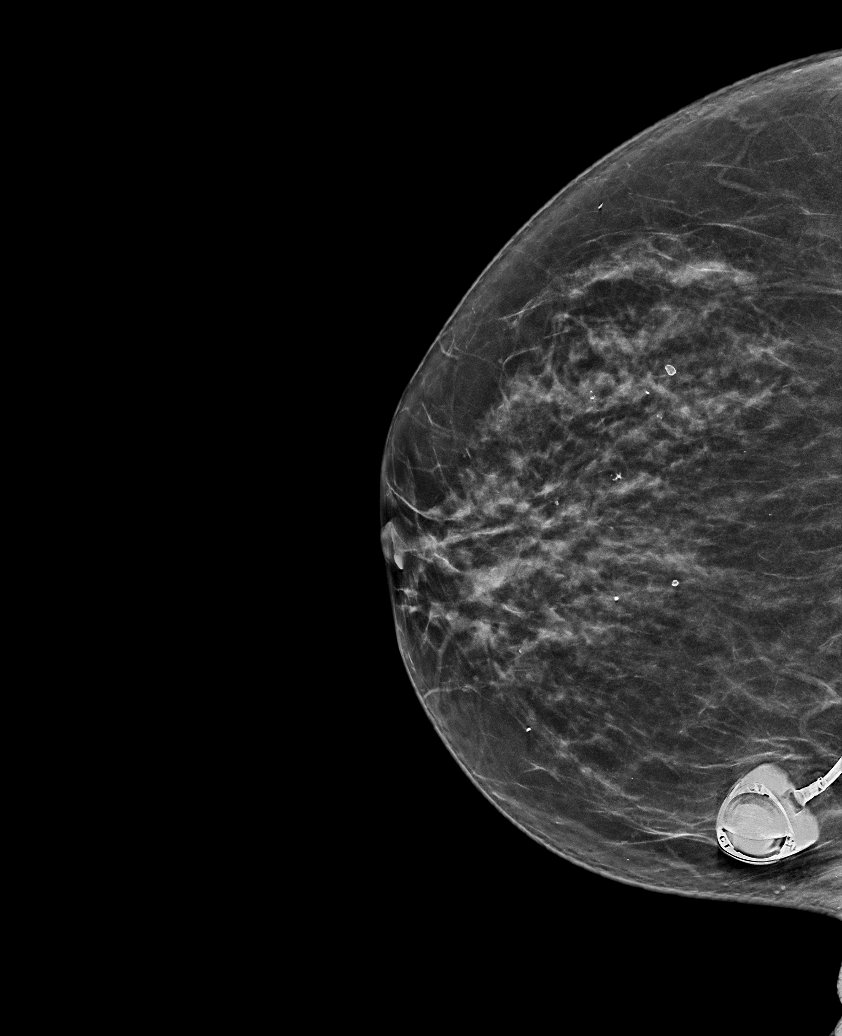

[R MLO synth-2D]
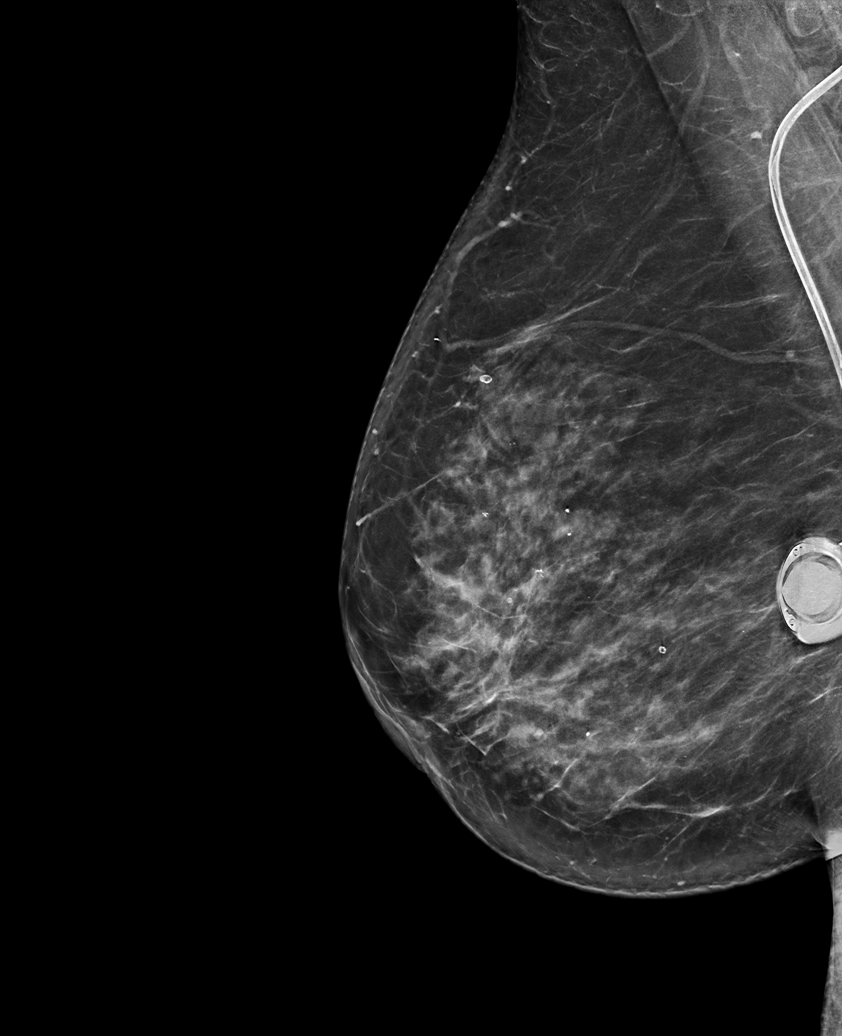

[R CC tomo · tomo slice 34/67.0]
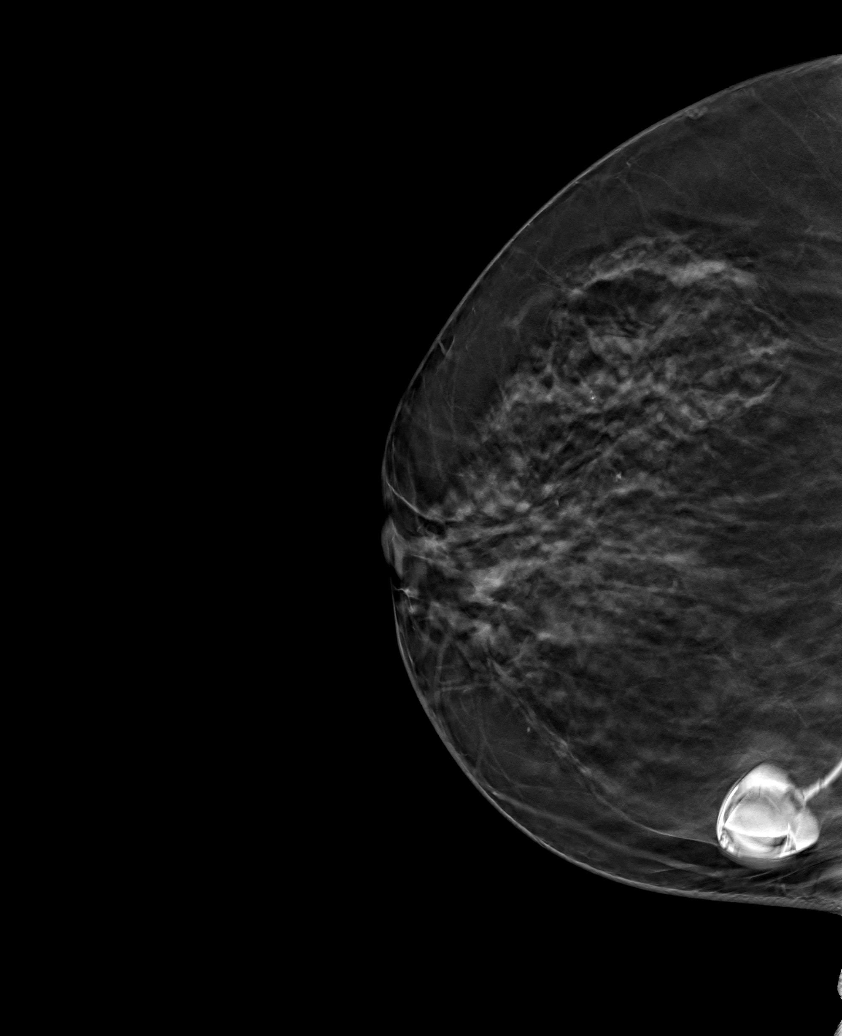

[6 of 25 positions shown; findings below may reference images not displayed]

ACR Breast Density Category c: The breast tissue is heterogeneously
dense, which may obscure small masses.
FINDINGS: Mammographically, there are no suspicious masses, areas of
nonsurgical architectural distortion or microcalcifications in
either breast. Expected posttreatment changes in the left breast
with moderate breast edema noted. Injectable port overlies the
medial right breast.

Mammographic images were processed with CAD.
IMPRESSION: No mammographic evidence of malignancy status post left lumpectomy
and radiation therapy.

RECOMMENDATION:
Diagnostic mammogram is suggested in 1 year. (Code:[S0])

I have discussed the findings and recommendations with the patient.
Results were also provided in writing at the conclusion of the
visit. If applicable, a reminder letter will be sent to the patient
regarding the next appointment.

BI-RADS CATEGORY  2: Benign.

## 2018-09-13 ENCOUNTER — Other Ambulatory Visit: Payer: Self-pay

## 2018-09-13 ENCOUNTER — Inpatient Hospital Stay: Payer: 59 | Attending: Internal Medicine

## 2018-09-13 DIAGNOSIS — C50812 Malignant neoplasm of overlapping sites of left female breast: Secondary | ICD-10-CM | POA: Diagnosis not present

## 2018-09-13 DIAGNOSIS — Z8379 Family history of other diseases of the digestive system: Secondary | ICD-10-CM | POA: Insufficient documentation

## 2018-09-13 DIAGNOSIS — N841 Polyp of cervix uteri: Secondary | ICD-10-CM | POA: Insufficient documentation

## 2018-09-13 DIAGNOSIS — R5383 Other fatigue: Secondary | ICD-10-CM | POA: Diagnosis not present

## 2018-09-13 DIAGNOSIS — M2550 Pain in unspecified joint: Secondary | ICD-10-CM | POA: Diagnosis not present

## 2018-09-13 DIAGNOSIS — Z794 Long term (current) use of insulin: Secondary | ICD-10-CM | POA: Insufficient documentation

## 2018-09-13 DIAGNOSIS — Z8042 Family history of malignant neoplasm of prostate: Secondary | ICD-10-CM | POA: Insufficient documentation

## 2018-09-13 DIAGNOSIS — Z803 Family history of malignant neoplasm of breast: Secondary | ICD-10-CM | POA: Diagnosis not present

## 2018-09-13 DIAGNOSIS — Z79899 Other long term (current) drug therapy: Secondary | ICD-10-CM | POA: Diagnosis not present

## 2018-09-13 DIAGNOSIS — N711 Chronic inflammatory disease of uterus: Secondary | ICD-10-CM | POA: Insufficient documentation

## 2018-09-13 DIAGNOSIS — Z170 Estrogen receptor positive status [ER+]: Secondary | ICD-10-CM | POA: Diagnosis not present

## 2018-09-13 DIAGNOSIS — Z95828 Presence of other vascular implants and grafts: Secondary | ICD-10-CM

## 2018-09-13 DIAGNOSIS — R202 Paresthesia of skin: Secondary | ICD-10-CM | POA: Insufficient documentation

## 2018-09-13 DIAGNOSIS — Z833 Family history of diabetes mellitus: Secondary | ICD-10-CM | POA: Insufficient documentation

## 2018-09-13 DIAGNOSIS — F17210 Nicotine dependence, cigarettes, uncomplicated: Secondary | ICD-10-CM | POA: Insufficient documentation

## 2018-09-13 MED ORDER — SODIUM CHLORIDE 0.9% FLUSH
10.0000 mL | Freq: Once | INTRAVENOUS | Status: AC
  Administered 2018-09-13: 10 mL via INTRAVENOUS
  Filled 2018-09-13: qty 10

## 2018-09-13 MED ORDER — HEPARIN SOD (PORK) LOCK FLUSH 100 UNIT/ML IV SOLN
500.0000 [IU] | Freq: Once | INTRAVENOUS | Status: AC
  Administered 2018-09-13: 500 [IU] via INTRAVENOUS

## 2018-09-14 ENCOUNTER — Encounter: Payer: Self-pay | Admitting: Surgery

## 2018-09-14 ENCOUNTER — Ambulatory Visit (INDEPENDENT_AMBULATORY_CARE_PROVIDER_SITE_OTHER): Payer: No Typology Code available for payment source | Admitting: Surgery

## 2018-09-14 VITALS — BP 113/78 | HR 97 | Temp 97.7°F | Ht 64.0 in | Wt 183.0 lb

## 2018-09-14 DIAGNOSIS — Z170 Estrogen receptor positive status [ER+]: Secondary | ICD-10-CM

## 2018-09-14 DIAGNOSIS — C50512 Malignant neoplasm of lower-outer quadrant of left female breast: Secondary | ICD-10-CM | POA: Diagnosis not present

## 2018-09-14 NOTE — Progress Notes (Signed)
09/14/2018  History of Present Illness: Carolyn Roy is a 53 y.o. female s/p left breast wire localized lumpectomy and sentinel node biopsy on 01/12/18 for breast cancer.  She had complication of breast wound dehiscence.  She completed radiation and is on endocrine therapy.  She reports still having soreness at the left breast near the scar.  Also has issues with neuropathy and has gotten nerve block injections in the past.  Denies any new masses, skin changes, nipple changes, drainage, or other concerns.  Past Medical History: Past Medical History:  Diagnosis Date  . Breast cancer ([place])   . Cancer ([place]) 06/15/2017   5.1 cm, T3,N1 (clinical): ER/ PR positive, Her 2 neu not overexpressed, High Ki 67. Neuoadjuvant chemotherapy.   . Depression   . Diabetes mellitus without complication ([place]) 7096  . Endometriosis   . Family history of breast cancer   . Headache    migraines  . Hyperlipidemia   . Ovarian mass   . Personal history of chemotherapy   . Personal history of radiation therapy   . Pneumonia    2018     Past Surgical History: Past Surgical History:  Procedure Laterality Date  . AXILLARY LYMPH NODE BIOPSY Left 07/14/2017   Procedure: INSERTION GEL MARK CLIP LEFT AXILLA;  Surgeon: Robert Bellow, MD;  Location: [facility];  Service: General;  Laterality: Left;  . BREAST BIOPSY Left    Dr Orlene Och BREAST METASTATIC CARCINOMA  . BREAST LUMPECTOMY Left 01/12/2018  . OOPHORECTOMY    . PARTIAL MASTECTOMY WITH NEEDLE LOCALIZATION Left 01/12/2018   Procedure: PARTIAL MASTECTOMY WITH NEEDLE LOCALIZATION;  Surgeon: Robert Bellow, MD;  Location: [facility];  Service: General;  Laterality: Left;  . PORTACATH PLACEMENT Right 07/14/2017   Procedure: INSERTION PORT-A-CATH;  Surgeon: Robert Bellow, MD;  Location: [facility];  Service: General;  Laterality: Right;  . SENTINEL NODE BIOPSY Left 01/12/2018   Procedure: SENTINEL NODE BIOPSY;  Surgeon: Robert Bellow, MD;   Location: [facility];  Service: General;  Laterality: Left;  . TUBAL LIGATION      Home Medications: Prior to Admission medications   Medication Sig Start Date End Date Taking? Authorizing Provider  albuterol (PROVENTIL HFA;VENTOLIN HFA) 108 (90 Base) MCG/ACT inhaler Inhale 2 puffs into the lungs every 6 (six) hours as needed for wheezing or shortness of breath. 03/31/17  Yes Kathrine Haddock, NP  anastrozole (ARIMIDEX) 1 MG tablet Take 1 tablet (1 mg total) by mouth daily. 06/19/18  Yes Cammie Sickle, MD  aspirin-acetaminophen-caffeine (EXCEDRIN MIGRAINE) [phone] MG tablet Take 2 tablets by mouth daily as needed for headache.   Yes [provider]  Blood Glucose Monitoring Suppl (FIFTY50 GLUCOSE METER 2.0) w/Device KIT Use as directed 01/23/18 01/23/19 Yes [provider]  canagliflozin (INVOKANA) 300 MG TABS tablet Take 300 mg by mouth daily.   Yes [provider]  Cholecalciferol (VITAMIN D3) 125 MCG (5000 UT) CAPS Take 1 capsule (5,000 Units total) by mouth daily with breakfast. Take along with calcium and magnesium. 07/09/18 10/17/18 Yes Milinda Pointer, MD  gabapentin (NEURONTIN) 300 MG capsule Take 3 capsules (900 mg total) by mouth 4 (four) times daily. 07/09/18 10/17/18 Yes Milinda Pointer, MD  glucose blood (ONE TOUCH ULTRA TEST) test strip Use up to 4 times/day 06/01/15  Yes Kathrine Haddock, NP  goserelin (ZOLADEX) 3.6 MG injection Inject 3.6 mg into the skin every 28 (twenty-eight) days.   Yes [provider]  Insulin Glargine (BASAGLAR KWIKPEN) 100 UNIT/ML  SOPN INJECT 0.24 MLS (24 UNITS TOTAL) INTO THE SKIN AT BEDTIME. Patient taking differently: Inject 44 Units into the skin at bedtime.  06/23/17  Yes Kathrine Haddock, NP  Insulin Pen Needle 32G X 4 MM MISC 1 Units by Does not apply route every morning. Pen needles 03/03/17  Yes Kathrine Haddock, NP  lidocaine-prilocaine (EMLA) cream Apply to affected area once 09/21/17  Yes Karen Kitchens, NP  Magnesium  500 MG CAPS Take 1 capsule (500 mg total) by mouth 2 (two) times daily at 8 am and 10 pm. 07/09/18 10/17/18 Yes Milinda Pointer, MD  ondansetron (ZOFRAN) 8 MG tablet Take 1 tablet (8 mg total) by mouth 2 (two) times daily as needed. Start on the third day after chemotherapy. 07/04/17  Yes Lequita Asal, MD  prochlorperazine (COMPAZINE) 10 MG tablet Take 1 tablet (10 mg total) by mouth every 6 (six) hours as needed (Nausea or vomiting). 07/04/17  Yes Corcoran, Drue Second, MD  oxyCODONE (OXY IR/ROXICODONE) 5 MG immediate release tablet Take 1-2 tablets (5-10 mg total) by mouth every 6 (six) hours as needed for up to 30 days for severe pain. Must last 30 days 07/19/18 08/28/18  Milinda Pointer, MD    Allergies: Allergies  Allergen Reactions  . Aspirin Nausea And Vomiting    Review of Systems: Review of Systems  Constitutional: Negative for chills and fever.  Respiratory: Negative for shortness of breath.   Cardiovascular: Negative for chest pain.  Gastrointestinal: Negative for nausea and vomiting.  Skin: Negative for rash.    Physical Exam BP 113/78   Pulse 97   Temp 97.7 F (36.5 C) (Skin)   Ht '5\' 4"'  (1.626 m)   Wt 183 lb (83 kg)   LMP  (LMP Unknown) Comment: LAST PERIOD IN MAY 2019 WHEN SHE STARTED CHEMO  BMI 31.41 kg/m  CONSTITUTIONAL: No acute distress HEENT:  Normocephalic, atraumatic, extraocular motion intact. RESPIRATORY:  Lungs are clear, and breath sounds are equal bilaterally. Normal respiratory effort without pathologic use of accessory muscles. CARDIOVASCULAR: Heart is regular without murmurs, gallops, or rubs. BREAST:  Left breast lumpectomy scar is well healed without any evidence of infection or drainage.  Left axillary scar well healed.  No palpable masses.  Skin changes consistent with radiation are present.  No nipple inversion or drainage.  Soreness to palpation over lateral half of breast and somewhat at the axilla.   Negative right breast and axillary  exam. SKIN:  Right chest port-a-cath in place, with well healed access and port scars. PSYCH:  Alert and oriented to person, place and time. Affect is normal.  Labs/Imaging: Mammogram 09/07/18: FINDINGS: Mammographically, there are no suspicious masses, areas of nonsurgical architectural distortion or microcalcifications in either breast. Expected posttreatment changes in the left breast with moderate breast edema noted. Injectable port overlies the medial right breast.  Mammographic images were processed with CAD.  IMPRESSION: No mammographic evidence of malignancy status post left lumpectomy and radiation therapy.   Assessment and Plan: This is a 53 y.o. female s/p left breast lumpectomy and SLNBx 01/2018  Patient is doing well from surgical standpoint.  She does have chronic joint/back pain, possibly from endocrine therapy vs neuropathy from neoadjuvant chemo.  Discussed with her that with time, the scarring should improve but unfortunately would not completely go away.  Skin changes should improve as well.  No suspicious or new findings on exam.  Will follow up in January 2021 for another physical exam.  Mammogram will be in July  2021.  Face-to-face time spent with the patient and care providers was 15 minutes, with more than 50% of the time spent counseling, educating, and coordinating care of the patient.     Melvyn Neth, [facility]

## 2018-09-14 NOTE — Patient Instructions (Addendum)
Return in five months. Breast check

## 2018-09-25 ENCOUNTER — Telehealth: Payer: Self-pay

## 2018-09-25 NOTE — Progress Notes (Signed)
Failed attempt at a Virtual Visit via Telephone   Patient's Phone No. & Preferred Pharmacy:  [phone] (home); [phone] (mobile); (Preferred) [phone] [email or web address]  [id number] - [place], [place] - [address] [address] Phone: [phone] Fax: [phone]    I attempted to contact Carolyn Roy on 09/26/2018 via telephone. I was unable to complete the visit due to no one answering the phone, or unable to get a proper contact number. I was also unable to leave a message I clearly identified myself on the recording as Gaspar Cola, MD.  Pharmacotherapy Assessment  Analgesic: Oxycodone IR 5 mg, 1 tab PO BID (10 mg/day of oxycodone) (Average: 2 tabs/day = 10 mg/day of oxycodone) MME/day: 15 mg/day.     Considering:   Possible bilateral lumbar facet RFA #1  Diagnostic lumbar sympathetic block  Diagnostic right-sided intra-articular knee injection (steroid)  Diagnostic right knee Hyalgan series #1  Diagnostic right genicular nerve block  Possible right-sided genicular nerve RFA    Palliative PRN treatment(s):   Palliative bilateral lumbar facet blocks     Recent Visits Date Type Provider Dept  08/28/18 Office Visit Milinda Pointer, MD [facility]  08/16/18 Procedure visit Milinda Pointer, MD [facility]  07/09/18 Office Visit Milinda Pointer, MD [facility]  Showing recent visits within past 90 days and meeting all other requirements   Today's Visits Date Type Provider Dept  09/26/18 Office Visit Milinda Pointer, MD [facility]  Showing today's visits and meeting all other requirements   Future Appointments No visits were found meeting these conditions.  Showing future appointments within next 90 days and meeting all other requirements   Note by: Gaspar Cola, MD Date: 09/26/2018; Time: 6:06 PM

## 2018-09-25 NOTE — Telephone Encounter (Signed)
No answer. Left message on AM to call so we can review medications.

## 2018-09-26 ENCOUNTER — Ambulatory Visit: Payer: 59 | Attending: Pain Medicine | Admitting: Pain Medicine

## 2018-09-26 ENCOUNTER — Other Ambulatory Visit: Payer: Self-pay

## 2018-09-26 ENCOUNTER — Inpatient Hospital Stay (HOSPITAL_BASED_OUTPATIENT_CLINIC_OR_DEPARTMENT_OTHER): Payer: 59 | Admitting: Obstetrics and Gynecology

## 2018-09-26 VITALS — BP 112/75 | HR 79 | Temp 97.4°F | Resp 18 | Wt 180.8 lb

## 2018-09-26 DIAGNOSIS — G894 Chronic pain syndrome: Secondary | ICD-10-CM

## 2018-09-26 DIAGNOSIS — N711 Chronic inflammatory disease of uterus: Secondary | ICD-10-CM | POA: Diagnosis not present

## 2018-09-26 DIAGNOSIS — G8929 Other chronic pain: Secondary | ICD-10-CM

## 2018-09-26 DIAGNOSIS — N841 Polyp of cervix uteri: Secondary | ICD-10-CM

## 2018-09-26 DIAGNOSIS — G5793 Unspecified mononeuropathy of bilateral lower limbs: Secondary | ICD-10-CM

## 2018-09-26 DIAGNOSIS — M79641 Pain in right hand: Secondary | ICD-10-CM

## 2018-09-26 DIAGNOSIS — M79642 Pain in left hand: Secondary | ICD-10-CM

## 2018-09-26 DIAGNOSIS — E559 Vitamin D deficiency, unspecified: Secondary | ICD-10-CM

## 2018-09-26 DIAGNOSIS — M47816 Spondylosis without myelopathy or radiculopathy, lumbar region: Secondary | ICD-10-CM

## 2018-09-26 DIAGNOSIS — M792 Neuralgia and neuritis, unspecified: Secondary | ICD-10-CM

## 2018-09-26 DIAGNOSIS — C50812 Malignant neoplasm of overlapping sites of left female breast: Secondary | ICD-10-CM | POA: Diagnosis not present

## 2018-09-26 DIAGNOSIS — Z170 Estrogen receptor positive status [ER+]: Secondary | ICD-10-CM

## 2018-09-26 DIAGNOSIS — C50512 Malignant neoplasm of lower-outer quadrant of left female breast: Secondary | ICD-10-CM

## 2018-09-26 DIAGNOSIS — G620 Drug-induced polyneuropathy: Secondary | ICD-10-CM

## 2018-09-26 DIAGNOSIS — M25562 Pain in left knee: Secondary | ICD-10-CM

## 2018-09-26 DIAGNOSIS — T451X5A Adverse effect of antineoplastic and immunosuppressive drugs, initial encounter: Secondary | ICD-10-CM

## 2018-09-26 NOTE — Progress Notes (Signed)
Pt in for follow up, reports has lost 8 lbs without trying and appetite has decreased as well.

## 2018-09-26 NOTE — Patient Instructions (Signed)
Please call the  Patient Engagement Center at [phone] to establish care with a Primary Care Provider.   In the meantime, you can access care through the following free and reduced cost health care locations in [place]:   -[facility] ([address], [place], [place] [address]- [phone])  -[facility] ([address], [address], [place] [address]- [phone])  -[name] ([address], [place], [place] [address]- [phone])  -[facility] ([address], [place]- [phone])  -[facility] [place]- [phone])  

## 2018-09-26 NOTE — Progress Notes (Signed)
Gynecologic Oncology Interval Visit   Chief Complaint: Cervical polyp with focal glandular atypia repeat biopsy endometrial biopsy with chronic endometritis  Referring Providers: Dr. Rogue Bussing (Medical-Oncology) Dr. Hervey Ard  Subjective:  Carolyn Roy is a 53 y.o. female, initially seen in consultation from Dr. Mike Gip for cervical mass on 07/05/2017.  She presents today for pelvic exam.   She was last seen by Dr. Fransisca Connors on 02/28/2018.  On exam, external os was open with visible canal approximately 1 cm.  Cervix is nontender, nonfriable, mobile.  She underwent cervical and endometrial biopsies.  Per note, Dr. Fransisca Connors felt polyp was nearly entirely removed.  DIAGNOSIS:  A. CERVIX; BIOPSY:  - ENDOMETRIAL POLYP WITH CHRONIC ENDOMETRITIS AND BOTH SQUAMOUS AND  TUBAL METAPLASIA - ACUTELY INFLAMED ENDOCERVICAL MUCOSA WITH REACTIVE CHANGES AND  SQUAMOUS METAPLASIA - NEGATIVE FOR ATYPIA / EIN AND MALIGNANCY - DEEPER SECTIONS WERE EXAMINED  DIAGNOSIS:  A. ENDOMETRIAL BIOPSY:  - SCANT FRAGMENTS OF ATROPHIC ENDOMETRIUM - DEEPER SECTIONS EXAMINED Comment: Atypia / EIN and malignancy are not identified in this limited sample. Correlation with clinical and radiographic findings is required  Recommendation was to return to clinic in 3 months.  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 She has seen Dr. Bary Castilla for surgery and underwent left breast wide excision with sentinel node biopsy on 01/12/18 with complete pathologic response. Currently status post radiation with Dr. Baruch Gouty. She is currently on tamoxifen.    She was previously followed by Dr. Mike Gip but has transitioned her care to Dr. Rogue Bussing in the interim. She has established care with Dr. Dossie Arbour for chronic pain and has pain contract.   She had delayed wound healing which prolonged radiation but she has now completed treatment.    Today, she reports she is overall doing well. She has mood swings and depression which interferes with her quality of life and she is concerned of impact on her work. She does not currently have a PCP and has not been able to establish care due to covid-19. She believes her last Pap before the 2019 Pap was in 2018 with Dr. Julian Hy. She doesn't currently have a PCP.    Gynecologic Oncology History Carolyn Roy is a pleasant female who is seen in consultation from Dr. Mike Gip for cervical mass.   Patient was initially referred to medical oncology for clinical stage IIIa left breast cancer, s/p biopsy. Pathology revealed grade III invasive ductal carcinoma area axillary FNA revealed malignant cells.  Tumor was ER positive, PR positive, HER-2/neu negative.  CA-27-29 was normal.  FSH and estradiol confirmed premenopausal status.  CT C/A/P on 06/29/2017 revealed: an infiltrative heterogeneously enhancing mass in the cervical region extending into the lower uterine segment that was concerning for primary cervical carcinoma.  IMPRESSION: 1. Large mass in the central aspect of the left breast measuring pproximately 3.7 x 4.4 x 4.2 cm. There are borderline enlarged left axillary and left internal mammary lymph nodes which are nonspecific but suspicious in the setting of known breast cancer. No other definite signs of metastatic disease elsewhere in the chest, abdomen or pelvis. 2. However, there is what appears to be an infiltrative heterogeneously enhancing mass in the cervical region extending into the lower uterine segment, concerning for concurrent primary cervical carcinoma.  "Cervical region extending into the lower uterine segment overall estimated to measure approximately 4.5 x 5.2 x 4.1"  Per Dr. Mike Gip, currently they plan for neoadjuvant  chemotherapy with standard dose dense AC every 2 weeks for 4 cycles followed by weekly Taxol for 12 weeks.    She had not been getting regular Pap  smears/pelvic exams and estimates last Pap smear approximately 2009.  She reports a history of abnormal pap smears and believes that she may have had '3 spots cut out of my cervix 30 years ago'.   07/05/2017 she was seen in [facility] [facility] and exam revealed 2 cm endocervical lesion located at the os and ballooning out the cervix, tender, mobile, firm to palpation  07/05/2017: DIAGNOSIS:  A. CERVIX; BIOPSY:  - INFLAMED POLYP WITH TUBAL METAPLASIA AND FOCAL GLANDULAR ATYPIA, SEE NOTE.  - MULTIPLE DEEPER LEVELS WERE EXAMINED.   Note: A p16 stain is obtained and highlights a focal area of atypia as well as inflamed epithelium. Pilar Plate malignancy is not identified in this material.   Pap: unsatisfactory for evaluation, insufficient squamous cellularity. HRHPV negative.   07/17/2017 IMPRESSION: 1. Hypermetabolic central left breast mass, maximum SUV 8.3, compatible with malignancy. Subtle accentuated activity in a small left axillary lymph node, but below background blood pool activity. 2. The cervical prominence noted on recent CT is not appreciably hypermetabolic. If there is underlying cervical cancer it is not appreciably hypermetabolic. However, there is some mild prominence of the endometrial stripe, which could result from cervical stenosis. 3.  Aortic Atherosclerosis (ICD10-I70.0).  07/14/2017 she underwent 1) placement of radiologic marker and left axillary nodal metastasis; 2) right subclavian PowerPort placement with ultrasound and fluoroscopic guidance with Dr. Bary Castilla.  MRI pelvis 10/27/2017 Reproductive: The uterus measures 8.6 x 4.8 x 6.6 cm (volume = 140 cm^3). There is fluid identified within the endometrial canal. No focal enhancing endometrial mass identified. There are 4 nabothian cyst identified within the wall of the cervix. The largest measures 1.6 cm. No discrete cervical mass identified. Normal physiologic appearance of the ovaries. No adnexal mass. Other:  No free fluid or fluid  collections. Musculoskeletal: No suspicious bone lesions identified. IMPRESSION: 1. No endometrial or cervical mass identified. Correlation with dedicated pelvic exam is advised. 2. Multiple nabothian cysts identified within the wall of the cervix.  Genetic Testing- [employer] 83 gene multi cancer panel.  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   02/19/2018- US pelvic complete with transvaginal Uterus: 8.6 x 5.0 x 6.1. Mildly heterogeneous myometrium without focal myometrial mass. Nabothian cysts at cervix Endometrium: 76m thickness- complex, heterogeneous with hypoechoic areas.  Right Ovary- 2.8 x 1.4 x 2.6 (normal) Left ovary- not visualized  IMPRESSION: Complex heterogeneous endometrial complex with small amount of endometrial fluid and a questionable area of focal abnormality question mass at mid uterus; recommend sonohysterogram for further evaluation, prior to hysteroscopy or endometrial biopsy.  Problem List: Patient Active Problem List   Diagnosis Date Noted  . Spondylosis without myelopathy or radiculopathy, lumbosacral region 08/16/2018  . DDD (degenerative disc disease), lumbosacral 08/16/2018  . Abnormal MRI, lumbar spine (04/20/2018) 05/09/2018  . Lumbar facet arthropathy 05/09/2018  . Chronic low back pain (Bilateral (L>R) w/o sciatica 03/14/2018  . Chronic hip pain (Left) 03/14/2018  . Lumbar facet syndrome (Bilateral) (L>R) 03/14/2018  . Idiopathic scoliosis 03/14/2018  . Chronic sacroiliac joint pain (Left) 03/14/2018  . Chronic pain of knee (Right) 03/14/2018  . History of breast cancer 03/14/2018  . Chronic upper extremity pain (Tertiary Area of Pain) (Bilateral) (  R>L) 02/14/2018  . Cancer-related pain 02/14/2018  . Vitamin D deficiency 02/14/2018  . Neuropathic pain 02/14/2018  . Osteoarthritis of knee (Right) 02/14/2018  . Type  2 diabetes mellitus with hyperglycemia, with long-term current use of insulin ([place]) 01/23/2018  . Chronic feet pain (Primary Area of Pain) (Bilateral) (R>L) 01/22/2018  . Neuropathic pain of feet (Bilateral) 01/22/2018  . Chronic knee pain (Secondary Area of Pain) (Bilateral) (R>L) 01/22/2018  . Chronic hand pain [facility] Area of Pain) (Bilateral) (R>L) 01/22/2018  . Chronic pain syndrome 01/22/2018  . Pharmacologic therapy 01/22/2018  . Disorder of skeletal system 01/22/2018  . Problems influencing health status 01/22/2018  . Chronic hip pain (Fourth Area of Pain) (Bilateral) (R>L) 01/22/2018  . Burning sensation of feet (Bilateral) 01/09/2018  . Numbness and tingling in [place] (Bilateral) 01/09/2018  . Malignant neoplasm of lower-outer quadrant of left breast of female, estrogen receptor positive ([place]) 11/08/2017  . Chemotherapy-induced neuropathy ([place]) 10/19/2017  . Chemotherapy-induced neutropenia ([place]) 07/29/2017  . Hyperglycemia 07/28/2017  . Goals of care, counseling/discussion 07/20/2017  . Encounter for antineoplastic chemotherapy 07/20/2017  . Cervical polyp 07/05/2017  . Family history of breast cancer   . Cancer of midline of breast, left ([place]) 06/15/2017  . Stage 1 mild COPD by GOLD classification ([place]) 03/31/2017  . Hypercholesterolemia 03/03/2017  . Tobacco abuse 11/15/2016  . Depression, major, single episode, severe ([place]) 11/15/2016  . Knee pain, left 05/29/2015  . Poorly controlled type 2 diabetes mellitus ([place]) 03/30/2015  . Chronic fatigue 03/30/2015  . Severe depression ([place]) 03/30/2015   Past Medical History: Past Medical History:  Diagnosis Date  . Breast cancer ([place])   . Cancer ([place]) 06/15/2017   5.1 cm, T3,N1 (clinical): ER/ PR positive, Her 2 neu not overexpressed, High Ki 67. Neuoadjuvant chemotherapy.   . Depression   . Diabetes mellitus without complication ([place]) 1829  . Endometriosis   . Family history of breast cancer   . Headache    migraines   . Hyperlipidemia   . Ovarian mass   . Personal history of chemotherapy   . Personal history of radiation therapy   . Pneumonia    2018   Past Surgical History: Past Surgical History:  Procedure Laterality Date  . AXILLARY LYMPH NODE BIOPSY Left 07/14/2017   Procedure: INSERTION GEL MARK CLIP LEFT AXILLA;  Surgeon: Robert Bellow, MD;  Location: [facility] ORS;  Service: General;  Laterality: Left;  . BREAST BIOPSY Left    Dr Orlene Och BREAST METASTATIC CARCINOMA  . BREAST LUMPECTOMY Left 01/12/2018  . OOPHORECTOMY    . PARTIAL MASTECTOMY WITH NEEDLE LOCALIZATION Left 01/12/2018   Procedure: PARTIAL MASTECTOMY WITH NEEDLE LOCALIZATION;  Surgeon: Robert Bellow, MD;  Location: [facility] ORS;  Service: General;  Laterality: Left;  . PORTACATH PLACEMENT Right 07/14/2017   Procedure: INSERTION PORT-A-CATH;  Surgeon: Robert Bellow, MD;  Location: [facility] ORS;  Service: General;  Laterality: Right;  . SENTINEL NODE BIOPSY Left 01/12/2018   Procedure: SENTINEL NODE BIOPSY;  Surgeon: Robert Bellow, MD;  Location: [facility] ORS;  Service: General;  Laterality: Left;  . TUBAL LIGATION     Past Gynecologic History:  G3P3 Age of menarche: 83 Duration of periods: 1-2 days Contraception: tubal  OB History:  OB History  Gravida Para Term Preterm AB Living  3 3          SAB TAB Ectopic Multiple Live Births               # Outcome  Date GA Lbr Len/2nd Weight Sex Delivery Anes PTL Lv  3 Para           2 Para           1 Para             Obstetric Comments  1st Menstrual Cycle:  14   1st Pregnancy:  54    Family History: Family History  Problem Relation Age of Onset  . Other Father        No info about father or paternal relatives  . Diabetes Brother   . Pancreatitis Brother   . Prostate cancer Brother 4       currently 71 / maternal half-brother  . Breast cancer Maternal Grandmother 40       deceased 49s  . Breast cancer Maternal Aunt 65       currently 60  . Breast cancer  Other 29       mother's sister; deceased 59  . Breast cancer Other        mother's sister; age at dx unknown    Social History: Social History   Socioeconomic History  . Marital status: Single    Spouse name: Not on file  . Number of children: Not on file  . Years of education: Not on file  . Highest education level: Not on file  Occupational History  . Not on file  Social Needs  . Financial resource strain: Not on file  . Food insecurity    Worry: Not on file    Inability: Not on file  . Transportation needs    Medical: Not on file    Non-medical: Not on file  Tobacco Use  . Smoking status: Current Every Day Smoker    Packs/day: 0.50    Years: 11.00    Pack years: 5.50    Types: Cigarettes  . Smokeless tobacco: Former Systems developer    Types: Snuff  Substance and Sexual Activity  . Alcohol use: No    Alcohol/week: 0.0 standard drinks  . Drug use: No  . Sexual activity: Yes  Lifestyle  . Physical activity    Days per week: Not on file    Minutes per session: Not on file  . Stress: Not on file  Relationships  . Social Herbalist on phone: Not on file    Gets together: Not on file    Attends religious service: Not on file    Active member of club or organization: Not on file    Attends meetings of clubs or organizations: Not on file    Relationship status: Not on file  . Intimate partner violence    Fear of current or ex partner: Not on file    Emotionally abused: Not on file    Physically abused: Not on file    Forced sexual activity: Not on file  Other Topics Concern  . Not on file  Social History Narrative  . Not on file   Allergies: Allergies  Allergen Reactions  . Aspirin Nausea And Vomiting   Current Medications: Current Outpatient Medications  Medication Sig Dispense Refill  . albuterol (PROVENTIL HFA;VENTOLIN HFA) 108 (90 Base) MCG/ACT inhaler Inhale 2 puffs into the lungs every 6 (six) hours as needed for wheezing or shortness of breath. 1  Inhaler 0  . anastrozole (ARIMIDEX) 1 MG tablet Take 1 tablet (1 mg total) by mouth daily. 30 tablet 3  . aspirin-acetaminophen-caffeine (EXCEDRIN MIGRAINE) 250-250-65 MG tablet Take 2  tablets by mouth daily as needed for headache.    . Blood Glucose Monitoring Suppl (FIFTY50 GLUCOSE METER 2.0) w/Device KIT Use as directed    . canagliflozin (INVOKANA) 300 MG TABS tablet Take 300 mg by mouth daily.    . Cholecalciferol (VITAMIN D3) 125 MCG (5000 UT) CAPS Take 1 capsule (5,000 Units total) by mouth daily with breakfast. Take along with calcium and magnesium. 100 capsule 0  . gabapentin (NEURONTIN) 300 MG capsule Take 3 capsules (900 mg total) by mouth 4 (four) times daily. 1200 capsule 0  . glucose blood (ONE TOUCH ULTRA TEST) test strip Use up to 4 times/day 100 each 12  . goserelin (ZOLADEX) 3.6 MG injection Inject 3.6 mg into the skin every 28 (twenty-eight) days.    . Insulin Glargine (BASAGLAR KWIKPEN) 100 UNIT/ML SOPN INJECT 0.24 MLS (24 UNITS TOTAL) INTO THE SKIN AT BEDTIME. (Patient taking differently: Inject 44 Units into the skin at bedtime. ) 15 pen 2  . Insulin Pen Needle 32G X 4 MM MISC 1 Units by Does not apply route every morning. Pen needles 90 each 3  . lidocaine-prilocaine (EMLA) cream Apply to affected area once 30 g 3  . Magnesium 500 MG CAPS Take 1 capsule (500 mg total) by mouth 2 (two) times daily at 8 am and 10 pm. 200 capsule 0  . ondansetron (ZOFRAN) 8 MG tablet Take 1 tablet (8 mg total) by mouth 2 (two) times daily as needed. Start on the third day after chemotherapy. 30 tablet 1  . prochlorperazine (COMPAZINE) 10 MG tablet Take 1 tablet (10 mg total) by mouth every 6 (six) hours as needed (Nausea or vomiting). 30 tablet 1  . oxyCODONE (OXY IR/ROXICODONE) 5 MG immediate release tablet Take 1-2 tablets (5-10 mg total) by mouth every 6 (six) hours as needed for up to 30 days for severe pain. Must last 30 days 60 tablet 0   No current facility-administered medications for  this visit.    Facility-Administered Medications Ordered in Other Visits  Medication Dose Route Frequency Provider Last Rate Last Dose  . heparin lock flush 100 unit/mL  500 Units Intravenous Once Corcoran, Melissa C, MD      . sodium chloride flush (NS) 0.9 % injection 10 mL  10 mL Intravenous Once Lequita Asal, MD       Review of Systems General: fatigue, weakness, decreased appetite  HEENT: no complaints  Lungs: no complaints  Cardiac: no complaints  GI: diarrhea  GU: gynecologic complaints of pelvic pain, no bleeding or discharge  Musculoskeletal: back pain  Extremities: no complaints  Skin: no complaints  Neuro: no complaints  Endocrine: no complaints  Psych: numbness and tingling; depression        Objective:  Physical Examination:  Today's Vitals   09/26/18 1159 09/26/18 1201  BP:  112/75  Pulse:  79  Resp:  18  Temp:  (!) 97.4 F (36.3 C)  TempSrc:  Tympanic  Weight:  180 lb 12.8 oz (82 kg)  PainSc: 0-No pain 0-No pain   Body mass index is 31.03 kg/m.  ECOG Performance Status: 0 - Asymptomatic   GENERAL: Patient is a well appearing female in no acute distress HEENT:  Atraumatic and normocephalic; schera nonicteric NODES:  No cervical, supraclavicular, axillary, or inguinal lymphadenopathy palpated.  LUNGS:  Clear to auscultation bilaterally.  No wheezes or rhonchi. HEART:  Regular rate and rhythm.  ABDOMEN:  Soft, nontender.  No ascites, masses or hepatosplenomegaly.  Positive, normoactive bowel  sounds.  MSK:  No focal spinal tenderness to palpation. Full range of motion bilaterally in the upper extremities. EXTREMITIES:  No peripheral edema.   SKIN:  Clear with no obvious rashes or skin changes. No nail dyscrasia. NEURO:  Nonfocal. Well oriented.  Appropriate affect.  Pelvic exam: chaperone NP VULVA: normal appearing vulva with no masses, tenderness or lesions, VAGINA: normal appearing vagina with normal color and discharge, no lesions, CERVIX:  normal appearing cervix without discharge or lesions; the previously seen polypoid lesion was completely biopsied off, UTERUS: uterus not grossly enlarged but limited exam due to habitus and nontender, ADNEXA: nontender and no masses but exam limited, RECTAL: deferred  Labs:  No labs on site today  Radiology: No imaging on site today    Assessment:  Carolyn Roy is a 53 y.o. female with incidental finding of cervical mass on CT scan 5/19 which was performed for treatment planning for breast cancer. Radiologic and pathology findings from 5/19 biopsy consistent with inflamed cervical polyp with focal glandular atypia. Pap unsatisfactory, HRHPV negative. PET negative for appreciable metabolic activity in cervix. Repeat cervical biopsy: endometrial polyp with endometritis. Polyp no longer appreciated.   Slightly thickened endometrial stripe; MRI fluid in uterus and no mass - EMBx atrophy. Patient asymptomatic   Chronic pelvic pain uncertain etiology, overall stable.    Urinary pressure, which she describes with obstructive type symptoms  Medical co-morbidities complicating care: Type 2 Diabetes Mellitus (poorly controlled), Smoker, stage T3N1Mx (stage II) Breast Cancer, COPD. Body mass index is 31.03 kg/m.  Plan:   Problem List Items Addressed This Visit      Genitourinary   Cervical polyp - Primary     Other   Malignant neoplasm of lower-outer quadrant of left breast of female, estrogen receptor positive ([place])   Relevant Orders   Ambulatory Referral to Palliative Care     We will try to find her most recent satisfactory Pap result. 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 Baruch Gouty. She is currently  on tamoxifen and ovarian suppression.  We will discuss with Dr. Rogue Bussing.   Otherwise she can be released from Gynecologic Oncology clinic.   She has multiple complaints that may be related to her breast cancer treatments. We are going to refer to Billey Chang, NP for Palliative supportive care services.    Beckey Rutter, DNP, AGNP-C [place] [facility] [phone] (work cell) [phone] (office)  I personally had a face to face interaction and evaluated the patient jointly with the NP, Ms. Beckey Rutter.  I have reviewed her history and available records and have performed the key portions of the physical exam including abdominal exam, pelvic exam with my findings confirming those documented above by the APP.  I have discussed the case with the APP and the patient.  I agree with the above documentation, assessment and plan which was fully formulated by me.  Counseling was completed by me.   I personally saw the patient and performed a substantive portion of this encounter in conjunction with the listed APP as documented above.  We discussed with Dr. Rogue Bussing Summer 2022 for repeat Pap as long as she is without evidence of recurrent breast cancer.   A total of 25  minutes were spent with the patient/family today; >50% was spent in education, counseling and coordination of care for history of cervical polyp and slightly thickened endometrial stripe.  Hayze Gazda Gaetana Michaelis, MD   CC:  Referring Providers: Dr. Charlaine Dalton Dr. Hervey Ard

## 2018-09-27 ENCOUNTER — Inpatient Hospital Stay (HOSPITAL_BASED_OUTPATIENT_CLINIC_OR_DEPARTMENT_OTHER): Payer: 59 | Admitting: Hospice and Palliative Medicine

## 2018-09-27 ENCOUNTER — Encounter: Payer: Self-pay | Admitting: Hospice and Palliative Medicine

## 2018-09-27 ENCOUNTER — Other Ambulatory Visit: Payer: Self-pay

## 2018-09-27 VITALS — BP 110/75 | HR 88 | Temp 98.0°F | Resp 18

## 2018-09-27 DIAGNOSIS — Z515 Encounter for palliative care: Secondary | ICD-10-CM | POA: Diagnosis not present

## 2018-09-27 DIAGNOSIS — Z170 Estrogen receptor positive status [ER+]: Secondary | ICD-10-CM

## 2018-09-27 DIAGNOSIS — C50512 Malignant neoplasm of lower-outer quadrant of left female breast: Secondary | ICD-10-CM | POA: Diagnosis not present

## 2018-09-27 DIAGNOSIS — C50812 Malignant neoplasm of overlapping sites of left female breast: Secondary | ICD-10-CM | POA: Diagnosis not present

## 2018-09-27 DIAGNOSIS — F339 Major depressive disorder, recurrent, unspecified: Secondary | ICD-10-CM

## 2018-09-27 MED ORDER — TRAZODONE HCL 50 MG PO TABS: 50.0000 mg | ORAL_TABLET | Freq: Every evening | ORAL | 2 refills | Status: DC | PRN

## 2018-09-27 MED ORDER — CITALOPRAM HYDROBROMIDE 20 MG PO TABS: 20.0000 mg | ORAL_TABLET | Freq: Every day | ORAL | 2 refills | Status: DC

## 2018-09-27 NOTE — Progress Notes (Signed)
[place]  [phone] [phone]   Name: Carolyn Roy Date: 09/27/2018 MRN: [id number]  DOB: May 09, 1965  Patient Care Team: Patient, No Pcp Per as PCP - General (General Practice) Bary Castilla, [name], MD (General Surgery) [name], [name], [name] (Obstetrics and Gynecology)    REASON FOR CONSULTATION: Palliative Care consult requested for this 53 y.o. female with multiple medical problems including stage IIIa left breast cancer (initially diagnosed 06/14/2017) status post neoadjuvant chemotherapy with lumpectomy 01/2018.  Patient later treated with tamoxifen but it was discontinued due to intolerance.  She is now on maintenance Arimidex.  PMH also notable for poorly controlled diabetes and history of depression.  She also has history of chronic pain managed by the pain clinic.  Patient was referred to palliative care to help manage ongoing symptoms.  SOCIAL HISTORY:     reports that she has been smoking cigarettes. She has a 5.50 pack-year smoking history. She has quit using smokeless tobacco.  Her smokeless tobacco use included snuff. She reports that she does not drink alcohol or use drugs.   Patient married her long-term boyfriend of 8 years in June 2020.  She lives at home with her husband.  She has 2 sons and a daughter.  Patient also has 2 stepsons.  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  ADVANCE DIRECTIVES:  Does not have  CODE STATUS:   PAST MEDICAL HISTORY: Past Medical History:  Diagnosis Date  . Breast cancer ([place])   . Cancer ([place]) 06/15/2017   5.1 cm, T3,N1 (clinical): ER/ PR positive, Her 2 neu not overexpressed, High Ki 67. Neuoadjuvant chemotherapy.   . Depression   . Diabetes mellitus without complication ([place]) 4481  . Endometriosis   . Family history of breast cancer   . Headache    migraines  . Hyperlipidemia   . Ovarian mass   . Personal history of chemotherapy   . Personal history of radiation therapy   . Pneumonia    2018    PAST SURGICAL HISTORY:  Past Surgical History:  Procedure Laterality Date  . AXILLARY LYMPH NODE BIOPSY Left 07/14/2017   Procedure: INSERTION GEL MARK CLIP LEFT AXILLA;  Surgeon: Robert Bellow, MD;  Location: [facility] ORS;  Service: General;  Laterality: Left;  . BREAST BIOPSY Left    Dr Orlene Och BREAST METASTATIC CARCINOMA  . BREAST LUMPECTOMY Left 01/12/2018  . OOPHORECTOMY    . PARTIAL MASTECTOMY WITH NEEDLE LOCALIZATION Left 01/12/2018   Procedure: PARTIAL MASTECTOMY WITH NEEDLE LOCALIZATION;  Surgeon: Robert Bellow, MD;  Location: [facility] ORS;  Service: General;  Laterality: Left;  . PORTACATH PLACEMENT Right 07/14/2017   Procedure: INSERTION PORT-A-CATH;  Surgeon: Robert Bellow, MD;  Location: [facility] ORS;  Service: General;  Laterality: Right;  . SENTINEL NODE BIOPSY Left 01/12/2018   Procedure: SENTINEL NODE BIOPSY;  Surgeon: Robert Bellow, MD;  Location: [facility] ORS;  Service: General;  Laterality: Left;  . TUBAL LIGATION      HEMATOLOGY/ONCOLOGY HISTORY:  Oncology History Overview Note  # MAY 2019-  clinical stage IIIA (T3N1Mx) left breast cancer s/p biopsy on 06/14/2017. -Pathology revealed grade III invasive ductal carcinoma. -Axillary FNA revealed malignant cells c/w metastatic carcinoma. Tumor was ER + (90%), PR + (30%), Her2/neu - and Ki67 70%.  CA27.29 was 7.8 on 06/14/2017.  # She received 4 cycles of AC with Neulasta support (  07/20/2017 - 08/31/2017).;  neoadjuvant Taxol on 09/14/2017.  #DEC 2019- Lumpectomy/sentinel lymph node biopsy [Dr.[name]]-complete pathologic response  # s/p RT [delayed sec to wound infection; Dr.[name]] finished RT [4/12]  # April 14th 2020- START TAM; stopped in mid-May secondary intolerance [severe migraines].  # 18th May 2020-start Arimidex [hormonal profile-postmenopausal;add  Zoladex q3M]  # PN-2 sec to taxol [name] management/ # may 2019- Endometrial sampling [Dr. Secord/Berchuck]-negative for malignancy/ # DM-2- poorly controlled.   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   # PAP SMEAR- RECOMMENDED 2022- summer  -------------------------------------------  DIAGNOSIS: left breast cancer  STAGE:  III       ;GOALS: cure  CURRENT/MOST RECENT THERAPY Tam    Cancer of midline of breast, left (HCC)  06/15/2017 Initial Diagnosis   Cancer of midline of breast, left ([place])   06/26/2017 -  Chemotherapy   The patient had DOXOrubicin (ADRIAMYCIN) chemo injection 122 mg, 60 mg/m2 = 122 mg, Intravenous,  Once, 4 of 4 cycles Administration: 122 mg (07/20/2017), 122 mg (08/03/2017), 122 mg (08/17/2017), 122 mg (08/31/2017) palonosetron (ALOXI) injection 0.25 mg, 0.25 mg, Intravenous,  Once, 4 of 4 cycles Administration: 0.25 mg (07/20/2017), 0.25 mg (08/03/2017), 0.25 mg (08/17/2017), 0.25 mg (08/31/2017) pegfilgrastim (NEULASTA) injection 6 mg, 6 mg, Subcutaneous, Once, 5 of 5 cycles Administration: 6 mg (07/21/2017), 6 mg (08/04/2017), 6 mg (08/18/2017), 6 mg (09/01/2017) cyclophosphamide (CYTOXAN) 1,220 mg in sodium chloride 0.9 % 250 mL chemo infusion, 600 mg/m2 = 1,220 mg, Intravenous,  Once, 4 of 4 cycles Administration: 1,220 mg (07/20/2017), 1,220 mg (08/03/2017), 1,220 mg (08/17/2017), 1,220 mg (08/31/2017) PACLitaxel (TAXOL) 162 mg in sodium chloride 0.9 % 250 mL chemo infusion (</= 60m/m2), 80 mg/m2 = 162 mg, Intravenous,  Once, 10 of 12 cycles Dose modification: 65 mg/m2 (original dose 80 mg/m2, Cycle 13, Reason: Provider Judgment, Comment: neuropathy) Administration: 162 mg (09/14/2017), 162 mg (09/21/2017), 162 mg (09/28/2017), 162 mg (10/05/2017), 162 mg (10/12/2017), 162 mg (10/19/2017), 162 mg (10/26/2017), 132 mg (11/02/2017), 132 mg (11/09/2017), 132 mg (11/16/2017) fosaprepitant (EMEND) 150 mg, dexamethasone (DECADRON) 12 mg in  sodium chloride 0.9 % 145 mL IVPB, , Intravenous,  Once, 4 of 4 cycles Administration:  (07/20/2017),  (08/03/2017),  (08/17/2017),  (08/31/2017)  for chemotherapy treatment.    Malignant neoplasm of lower-outer quadrant of left breast of female, estrogen receptor positive [place]  11/[place]/2017 Initial Diagnosis   Malignant neoplasm of lower-outer quadrant of left breast of female, estrogen receptor positive ([place]     ALLERGIES:  is allergic to aspirin.  MEDICATIONS:  Current Outpatient Medications  Medication Sig Dispense Refill  . albuterol (PROVENTIL HFA;VENTOLIN HFA) 108 (90 Base) MCG/ACT inhaler Inhale 2 puffs into the lungs every 6 (six) hours as needed for wheezing or shortness of breath. 1 Inhaler 0  . anastrozole (ARIMIDEX) 1 MG tablet Take 1 tablet (1 mg total) by mouth daily. 30 tablet 3  . aspirin-acetaminophen-caffeine (EXCEDRIN MIGRAINE) 250-250-65 MG tablet Take 2 tablets by mouth daily as needed for headache.    . Blood Glucose Monitoring Suppl (FIFTY50 GLUCOSE METER 2.0) w/Device KIT Use as directed    . canagliflozin (INVOKANA) 300 MG TABS tablet Take 300 mg by mouth daily.    . Cholecalciferol (VITAMIN D3) 125 MCG (5000 UT) CAPS Take 1 capsule (5,000 Units total) by mouth daily with breakfast. Take along with calcium and magnesium. 100 capsule 0  . gabapentin (NEURONTIN) 300 MG capsule Take 3 capsules (900 mg total) by mouth  4 (four) times daily. 1200 capsule 0  . glucose blood (ONE TOUCH ULTRA TEST) test strip Use up to 4 times/day 100 each 12  . goserelin (ZOLADEX) 3.6 MG injection Inject 3.6 mg into the skin every 28 (twenty-eight) days.    . Insulin Glargine (BASAGLAR KWIKPEN) 100 UNIT/ML SOPN INJECT 0.24 MLS (24 UNITS TOTAL) INTO THE SKIN AT BEDTIME. (Patient taking differently: Inject 44 Units into the skin at bedtime. ) 15 pen 2  . Insulin Pen Needle 32G X 4 MM MISC 1 Units by Does not apply route every morning. Pen needles 90 each 3  . lidocaine-prilocaine (EMLA) cream  Apply to affected area once 30 g 3  . Magnesium 500 MG CAPS Take 1 capsule (500 mg total) by mouth 2 (two) times daily at 8 am and 10 pm. 200 capsule 0  . ondansetron (ZOFRAN) 8 MG tablet Take 1 tablet (8 mg total) by mouth 2 (two) times daily as needed. Start on the third day after chemotherapy. 30 tablet 1  . prochlorperazine (COMPAZINE) 10 MG tablet Take 1 tablet (10 mg total) by mouth every 6 (six) hours as needed (Nausea or vomiting). 30 tablet 1  . oxyCODONE (OXY IR/ROXICODONE) 5 MG immediate release tablet Take 1-2 tablets (5-10 mg total) by mouth every 6 (six) hours as needed for up to 30 days for severe pain. Must last 30 days 60 tablet 0   No current facility-administered medications for this visit.    Facility-Administered Medications Ordered in Other Visits  Medication Dose Route Frequency Provider Last Rate Last Dose  . heparin lock flush 100 unit/mL  500 Units Intravenous Once Corcoran, Melissa C, MD      . sodium chloride flush (NS) 0.9 % injection 10 mL  10 mL Intravenous Once Corcoran, Drue Second, MD        VITAL SIGNS: BP 110/75 (BP Location: Left Arm, Patient Position: Sitting)   Pulse 88   Temp 98 F (36.7 C) (Tympanic)   Resp 18   LMP  (LMP Unknown) Comment: LAST PERIOD IN MAY 2019 WHEN SHE STARTED CHEMO Filed Weights    Estimated body mass index is 31.03 kg/m as calculated from the following:   Height as of 09/14/18: _0  (1.626 m).   Weight as of 09/26/18: 180 lb 12.8 oz (82 kg).  LABS: CBC:    Component Value Date/Time   WBC 6.2 05/22/2018 1146   HGB 14.8 05/22/2018 1146   HGB 15.1 06/14/2017 1612   HCT 45.8 05/22/2018 1146   HCT 43.8 06/14/2017 1612   PLT 227 05/22/2018 1146   PLT 334 06/14/2017 1612   MCV 94.0 05/22/2018 1146   MCV 91 06/14/2017 1612   NEUTROABS 5.2 05/22/2018 1146   NEUTROABS 6.6 06/14/2017 1612   LYMPHSABS 0.6 (L) 05/22/2018 1146   LYMPHSABS 2.9 06/14/2017 1612   MONOABS 0.3 05/22/2018 1146   EOSABS 0.0 05/22/2018 1146   EOSABS  0.1 06/14/2017 1612   BASOSABS 0.0 05/22/2018 1146   BASOSABS 0.0 06/14/2017 1612   Comprehensive Metabolic Panel:    Component Value Date/Time   NA 138 07/18/2018 1113   NA 139 01/22/2018 1200   K 3.7 07/18/2018 1113   CL 104 07/18/2018 1113   CO2 25 07/18/2018 1113   BUN 8 07/18/2018 1113   BUN 8 01/22/2018 1200   CREATININE 0.61 07/18/2018 1113   GLUCOSE 312 (H) 07/18/2018 1113   CALCIUM 8.3 (L) 07/18/2018 1113   AST 16 05/22/2018 1146   ALT 18  05/22/2018 1146   ALKPHOS 94 05/22/2018 1146   BILITOT 0.6 05/22/2018 1146   BILITOT 0.4 01/22/2018 1200   PROT 7.7 05/22/2018 1146   PROT 6.4 01/22/2018 1200   ALBUMIN 4.2 05/22/2018 1146   ALBUMIN 4.1 01/22/2018 1200    RADIOGRAPHIC STUDIES: Mm Diag Breast Tomo Bilateral  Result Date: 09/07/2018 CLINICAL DATA:  History of treated left breast cancer status post breast conservation therapy in 2019. EXAM: DIGITAL DIAGNOSTIC BILATERAL MAMMOGRAM WITH CAD AND TOMO COMPARISON:  Previous exam(s). ACR Breast Density Category c: The breast tissue is heterogeneously dense, which may obscure small masses. FINDINGS: Mammographically, there are no suspicious masses, areas of nonsurgical architectural distortion or microcalcifications in either breast. Expected posttreatment changes in the left breast with moderate breast edema noted. Injectable port overlies the medial right breast. Mammographic images were processed with CAD. IMPRESSION: No mammographic evidence of malignancy status post left lumpectomy and radiation therapy. RECOMMENDATION: Diagnostic mammogram is suggested in 1 year. (Code:[id number]-B-01Y) I have discussed the findings and recommendations with the patient. Results were also provided in writing at the conclusion of the visit. If applicable, a reminder letter will be sent to the patient regarding the next appointment. BI-RADS CATEGORY  2: Benign. Electronically Signed   By: Fidela Salisbury M.D.   On: 09/07/2018 11:40    PERFORMANCE  STATUS (ECOG) : 1 - Symptomatic but completely ambulatory  Review of Systems Unless otherwise noted, a complete review of systems is negative.  Physical Exam General: NAD, frail appearing Pulmonary: Unlabored Extremities: no edema, no joint deformities Skin: no rashes Neurological: Grossly nonfocal Psychiatric: Tearful  IMPRESSION: I met with patient today to introduce palliative care services.  Patient says that her primary issue is worsening depression, anxiety, and rage.  Patient cites her cancer as being her primary stressor.  Despite stable disease, she is fearful of recurrence.  She says that she has been previously treated for depression with Effexor but treatment was discontinued about a year ago due to gastrointestinal intolerance.  Patient says that in total she was on the Effexor for about a year.  She denies ever having seen a psychiatrist.  Her PCP was previously prescribing her antidepressant but she is no longer at that practice.  Patient is currently looking for a new PCP.  Patient says that she is frequently tearful and prone to feelings of anger and anxiety.  She is having difficulty with insomnia, which has been unrelieved with use of melatonin and multiple other OTC p.m. formulations.  She has also had recent poor oral intake with weight loss.  I do note that weight has decreased from 189 pounds in July to current weight of 180 pounds.  Patient endorses a history of suicidal thoughts but says that she has never had a plan nor has she attempted to enact one.  She denies HI. We discussed suicide prevention hotlines and she agreed to notify someone or call if she felt actively suicidal.   Patient denies auditory or visual hallucinations at present but says that she has chronically seen and heard things since she was a young child.  She describes hearing her name called but will look and not see anyone present.  She also describes seeing shapes of people similar to "a  ghost".  We discussed coping strategies including exercise and meditation.  She is interested in trying yoga.  Patient is also interested in a referral to speak with a counselor.  Patient would be interested in a referral to psychiatry.  Case  discussed with Dr. Nicolasa Ducking.  Will initiate treatment with Celexa/trazodone and send a referral for outpatient psychiatric evaluation.  Case and plan discussed with Dr. Rogue Bussing.  PLAN: -Continue current scope of treatment -Start Celexa 20 mg every morning and trazodone 50 to 100 mg nightly -Referral to psychiatry -Referral to counseling -RTC in 2 weeks.     Patient expressed understanding and was in agreement with this plan. She also understands that She can call the clinic at any time with any questions, concerns, or complaints.     Time Total: 45 minutes  Visit consisted of counseling and education dealing with the complex and emotionally intense issues of symptom management and palliative care in the setting of serious and potentially life-threatening illness.Greater than 50%  of this time was spent counseling and coordinating care related to the above assessment and plan.  Signed by: Altha Harm, PhD, NP-C [phone] (Work Cell)

## 2018-10-03 ENCOUNTER — Ambulatory Visit: Payer: 59 | Admitting: Pain Medicine

## 2018-10-09 ENCOUNTER — Other Ambulatory Visit: Payer: Self-pay | Admitting: Pain Medicine

## 2018-10-09 DIAGNOSIS — M792 Neuralgia and neuritis, unspecified: Secondary | ICD-10-CM

## 2018-10-09 DIAGNOSIS — G620 Drug-induced polyneuropathy: Secondary | ICD-10-CM

## 2018-10-09 DIAGNOSIS — G5793 Unspecified mononeuropathy of bilateral lower limbs: Secondary | ICD-10-CM

## 2018-10-11 ENCOUNTER — Inpatient Hospital Stay: Payer: 59

## 2018-10-11 ENCOUNTER — Inpatient Hospital Stay: Payer: 59 | Admitting: Internal Medicine

## 2018-10-11 ENCOUNTER — Inpatient Hospital Stay: Payer: 59 | Admitting: Hospice and Palliative Medicine

## 2018-10-19 ENCOUNTER — Telehealth: Payer: Self-pay | Admitting: Hospice and Palliative Medicine

## 2018-10-19 ENCOUNTER — Other Ambulatory Visit: Payer: Self-pay | Admitting: Pain Medicine

## 2018-10-19 DIAGNOSIS — E559 Vitamin D deficiency, unspecified: Secondary | ICD-10-CM

## 2018-10-19 NOTE — Telephone Encounter (Signed)
Received information from psychiatry office that they have had difficulty reaching patient. I spoke with their practice. I also tried calling patient but did not reach her. She has a scheduled appointment with Dr. Rogue Bussing and me next week.

## 2018-10-26 ENCOUNTER — Inpatient Hospital Stay: Payer: 59 | Attending: Internal Medicine

## 2018-10-26 ENCOUNTER — Other Ambulatory Visit: Payer: Self-pay

## 2018-10-26 ENCOUNTER — Inpatient Hospital Stay: Payer: 59 | Admitting: Nurse Practitioner

## 2018-10-26 ENCOUNTER — Inpatient Hospital Stay (HOSPITAL_BASED_OUTPATIENT_CLINIC_OR_DEPARTMENT_OTHER): Payer: 59 | Admitting: Internal Medicine

## 2018-10-26 DIAGNOSIS — M2550 Pain in unspecified joint: Secondary | ICD-10-CM | POA: Diagnosis not present

## 2018-10-26 DIAGNOSIS — Z79899 Other long term (current) drug therapy: Secondary | ICD-10-CM | POA: Diagnosis not present

## 2018-10-26 DIAGNOSIS — Z833 Family history of diabetes mellitus: Secondary | ICD-10-CM | POA: Diagnosis not present

## 2018-10-26 DIAGNOSIS — C50512 Malignant neoplasm of lower-outer quadrant of left female breast: Secondary | ICD-10-CM

## 2018-10-26 DIAGNOSIS — Z5111 Encounter for antineoplastic chemotherapy: Secondary | ICD-10-CM | POA: Diagnosis not present

## 2018-10-26 DIAGNOSIS — Z886 Allergy status to analgesic agent status: Secondary | ICD-10-CM | POA: Diagnosis not present

## 2018-10-26 DIAGNOSIS — Z803 Family history of malignant neoplasm of breast: Secondary | ICD-10-CM | POA: Insufficient documentation

## 2018-10-26 DIAGNOSIS — R202 Paresthesia of skin: Secondary | ICD-10-CM | POA: Insufficient documentation

## 2018-10-26 DIAGNOSIS — R232 Flushing: Secondary | ICD-10-CM | POA: Insufficient documentation

## 2018-10-26 DIAGNOSIS — G8929 Other chronic pain: Secondary | ICD-10-CM | POA: Insufficient documentation

## 2018-10-26 DIAGNOSIS — Z794 Long term (current) use of insulin: Secondary | ICD-10-CM | POA: Diagnosis not present

## 2018-10-26 DIAGNOSIS — Z170 Estrogen receptor positive status [ER+]: Secondary | ICD-10-CM

## 2018-10-26 DIAGNOSIS — Z90721 Acquired absence of ovaries, unilateral: Secondary | ICD-10-CM | POA: Diagnosis not present

## 2018-10-26 DIAGNOSIS — Z8379 Family history of other diseases of the digestive system: Secondary | ICD-10-CM | POA: Diagnosis not present

## 2018-10-26 DIAGNOSIS — Z8042 Family history of malignant neoplasm of prostate: Secondary | ICD-10-CM | POA: Insufficient documentation

## 2018-10-26 DIAGNOSIS — Z9221 Personal history of antineoplastic chemotherapy: Secondary | ICD-10-CM | POA: Insufficient documentation

## 2018-10-26 DIAGNOSIS — F17210 Nicotine dependence, cigarettes, uncomplicated: Secondary | ICD-10-CM | POA: Insufficient documentation

## 2018-10-26 DIAGNOSIS — Z923 Personal history of irradiation: Secondary | ICD-10-CM | POA: Diagnosis not present

## 2018-10-26 DIAGNOSIS — F329 Major depressive disorder, single episode, unspecified: Secondary | ICD-10-CM | POA: Insufficient documentation

## 2018-10-26 DIAGNOSIS — F419 Anxiety disorder, unspecified: Secondary | ICD-10-CM | POA: Insufficient documentation

## 2018-10-26 DIAGNOSIS — E1142 Type 2 diabetes mellitus with diabetic polyneuropathy: Secondary | ICD-10-CM | POA: Diagnosis not present

## 2018-10-26 LAB — CBC
HCT: 41.5 % (ref 36.0–46.0)
Hemoglobin: 13.5 g/dL (ref 12.0–15.0)
MCH: 31.3 pg (ref 26.0–34.0)
MCHC: 32.5 g/dL (ref 30.0–36.0)
MCV: 96.1 fL (ref 80.0–100.0)
Platelets: 197 10*3/uL (ref 150–400)
RBC: 4.32 MIL/uL (ref 3.87–5.11)
RDW: 13.4 % (ref 11.5–15.5)
WBC: 5.5 10*3/uL (ref 4.0–10.5)
nRBC: 0 % (ref 0.0–0.2)

## 2018-10-26 NOTE — Assessment & Plan Note (Addendum)
#   breast cancer Stage III ER/PR pos her 2 NEG. currently on anastrazole+ Zoldadex.  No clinical evidence of recurrence.  Stable.  #Continue current therapy; tolerating fairly well except for continued joint pains.    # Joint pains-overall stable.  Sec to AI-  on vit D/continue Osteo Bi-Flex/Neurontin.  # Depression/anxiety-on Celexa improved.  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  Stable  # DM-2 on insulin-stable  # DISPOSITION: # Follow up with Lauren in 1 week/ Zoladex injection.  #Follow-up in second week of dec 2020- MD;cbc/cmp/ca-27-29;Zoldadex/ Zometa Dr.B

## 2018-10-26 NOTE — Progress Notes (Signed)
[place] CONSULT NOTE  Patient Care Team: Patient, No Pcp Per as PCP - General (General Practice) [name], [name], MD (General Surgery) [name], [name], [name] (Obstetrics and Gynecology)  CHIEF COMPLAINTS/PURPOSE OF CONSULTATION: Breast cancer  Oncology History Overview Note  # MAY 2019-  clinical stage IIIA (T3N1Mx) left breast cancer s/p biopsy on 06/14/2017. -Pathology revealed grade III invasive ductal carcinoma. -Axillary FNA revealed malignant cells c/w metastatic carcinoma. Tumor was ER + (90%), PR + (30%), Her2/neu - and Ki67 70%.  CA27.29 was 7.8 on 06/14/2017.  # She received 4 cycles of AC with Neulasta support (07/20/2017 - 08/31/2017).;  neoadjuvant Taxol on 09/14/2017.  #DEC 2019- Lumpectomy/sentinel lymph node biopsy [Dr.[name]]-complete pathologic response  # s/p RT [delayed sec to wound infection; Dr.[name]] finished RT [4/12]  # April 14th 2020- START TAM; stopped in mid-May secondary intolerance [severe migraines].  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 Secord/Berchuck]-negative for malignancy/ # DM-2- poorly controlled.   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   # PAP SMEAR- RECOMMENDED 2022- summer  -------------------------------------------  DIAGNOSIS: left breast cancer  STAGE:  III       ;GOALS: cure  CURRENT/MOST RECENT THERAPY Tam    Cancer of midline of breast, left (HCC)  06/15/2017 Initial Diagnosis   Cancer of midline of breast, left ([place])   06/26/2017 -  Chemotherapy   The patient had DOXOrubicin (ADRIAMYCIN) chemo injection 122 mg, 60 mg/m2 = 122 mg, Intravenous,  Once, 4 of 4 cycles Administration: 122 mg (07/20/2017), 122 mg (08/03/2017), 122 mg (08/17/2017), 122 mg  (08/31/2017) palonosetron (ALOXI) injection 0.25 mg, 0.25 mg, Intravenous,  Once, 4 of 4 cycles Administration: 0.25 mg (07/20/2017), 0.25 mg (08/03/2017), 0.25 mg (08/17/2017), 0.25 mg (08/31/2017) pegfilgrastim (NEULASTA) injection 6 mg, 6 mg, Subcutaneous, Once, 5 of 5 cycles Administration: 6 mg (07/21/2017), 6 mg (08/04/2017), 6 mg (08/18/2017), 6 mg (09/01/2017) cyclophosphamide (CYTOXAN) 1,220 mg in sodium chloride 0.9 % 250 mL chemo infusion, 600 mg/m2 = 1,220 mg, Intravenous,  Once, 4 of 4 cycles Administration: 1,220 mg (07/20/2017), 1,220 mg (08/03/2017), 1,220 mg (08/17/2017), 1,220 mg (08/31/2017) PACLitaxel (TAXOL) 162 mg in sodium chloride 0.9 % 250 mL chemo infusion (</= 33m/m2), 80 mg/m2 = 162 mg, Intravenous,  Once, 10 of 12 cycles Dose modification: 65 mg/m2 (original dose 80 mg/m2, Cycle 13, Reason: Provider Judgment, Comment: neuropathy) Administration: 162 mg (09/14/2017), 162 mg (09/21/2017), 162 mg (09/28/2017), 162 mg (10/05/2017), 162 mg (10/12/2017), 162 mg (10/19/2017), 162 mg (10/26/2017), 132 mg (11/02/2017), 132 mg (11/09/2017), 132 mg (11/16/2017) fosaprepitant (EMEND) 150 mg, dexamethasone (DECADRON) 12 mg in sodium chloride 0.9 % 145 mL IVPB, , Intravenous,  Once, 4 of 4 cycles Administration:  (07/20/2017),  (08/03/2017),  (08/17/2017),  (08/31/2017)  for chemotherapy treatment.    Malignant neoplasm of lower-outer quadrant of left breast of female, estrogen receptor positive ([facility] [facility]  11/08/2017 Initial Diagnosis   Malignant neoplasm of lower-outer quadrant of left breast of female, estrogen receptor positive ([facility]     HISTORY OF PRESENTING ILLNESS:  CNalanie WinieckiWhite 53y.o.  female with a history of stage III ER PR positive HER-2/neu negative breast cancer currently on Arimidex plus Zoladex is here for follow-up.  In the interim patient was evaluated in the gynecology-oncology clinic.  She is recommended to have  a repeat Pap smear in summer 2022.  Patient was also seen the symptom  management clinic-started on Celexa for her depression/anxiety.  Patient notes to have significant improvement of her symptoms.  Patient denies any nausea vomiting headaches.  No fevers or chills.  No new lumps or bumps.  She continues to complain of chronic joint pains hands and feet.  Is not any worse.   Intermittent hot flashes.  Review of Systems  Constitutional: Positive for malaise/fatigue. Negative for chills, diaphoresis, fever and weight loss.  HENT: Negative for nosebleeds and sore throat.   Eyes: Negative for double vision.  Respiratory: Negative for cough, hemoptysis, sputum production, shortness of breath and wheezing.   Cardiovascular: Negative for chest pain, palpitations, orthopnea and leg swelling.  Gastrointestinal: Negative for abdominal pain, blood in stool, constipation, diarrhea, heartburn, melena, nausea and vomiting.  Genitourinary: Negative for dysuria, frequency and urgency.  Musculoskeletal: Positive for joint pain. Negative for back pain.  Skin: Negative.  Negative for itching and rash.  Neurological: Positive for tingling. Negative for dizziness, focal weakness, weakness and headaches.  Endo/Heme/Allergies: Does not bruise/bleed easily.  Psychiatric/Behavioral: Negative for depression. The patient is not nervous/anxious and does not have insomnia.      MEDICAL HISTORY:  Past Medical History:  Diagnosis Date  . Breast cancer ([place])   . Cancer ([place]) 06/15/2017   5.1 cm, T3,N1 (clinical): ER/ PR positive, Her 2 neu not overexpressed, High Ki 67. Neuoadjuvant chemotherapy.   . Depression   . Diabetes mellitus without complication ([place]) 1308  . Endometriosis   . Family history of breast cancer   . Headache    migraines  . Hyperlipidemia   . Ovarian mass   . Personal history of chemotherapy   . Personal history of radiation therapy   . Pneumonia    2018    SURGICAL HISTORY: Past Surgical History:  Procedure Laterality Date  . AXILLARY LYMPH NODE  BIOPSY Left 07/14/2017   Procedure: INSERTION GEL MARK CLIP LEFT AXILLA;  Surgeon: Robert Bellow, MD;  Location: [facility] ORS;  Service: General;  Laterality: Left;  . BREAST BIOPSY Left    Dr Orlene Och BREAST METASTATIC CARCINOMA  . BREAST LUMPECTOMY Left 01/12/2018  . OOPHORECTOMY    . PARTIAL MASTECTOMY WITH NEEDLE LOCALIZATION Left 01/12/2018   Procedure: PARTIAL MASTECTOMY WITH NEEDLE LOCALIZATION;  Surgeon: Robert Bellow, MD;  Location: [facility] ORS;  Service: General;  Laterality: Left;  . PORTACATH PLACEMENT Right 07/14/2017   Procedure: INSERTION PORT-A-CATH;  Surgeon: Robert Bellow, MD;  Location: [facility] ORS;  Service: General;  Laterality: Right;  . SENTINEL NODE BIOPSY Left 01/12/2018   Procedure: SENTINEL NODE BIOPSY;  Surgeon: Robert Bellow, MD;  Location: [facility] ORS;  Service: General;  Laterality: Left;  . TUBAL LIGATION      SOCIAL HISTORY: Social History   Socioeconomic History  . Marital status: Single    Spouse name: Not on file  . Number of children: Not on file  . Years of education: Not on file  . Highest education level: Not on file  Occupational History  . Not on file  Social Needs  . Financial resource strain: Not on file  . Food insecurity    Worry: Not on file    Inability: Not on file  . Transportation needs    Medical: Not on file    Non-medical: Not on file  Tobacco Use  . Smoking status: Current Every Day Smoker    Packs/day: 0.50  Years: 11.00    Pack years: 5.50    Types: Cigarettes  . Smokeless tobacco: Former Systems developer    Types: Snuff  Substance and Sexual Activity  . Alcohol use: No    Alcohol/week: 0.0 standard drinks  . Drug use: No  . Sexual activity: Yes  Lifestyle  . Physical activity    Days per week: Not on file    Minutes per session: Not on file  . Stress: Not on file  Relationships  . Social Herbalist on phone: Not on file    Gets together: Not on file    Attends religious service: Not on file     Active member of club or organization: Not on file    Attends meetings of clubs or organizations: Not on file    Relationship status: Not on file  . Intimate partner violence    Fear of current or ex partner: Not on file    Emotionally abused: Not on file    Physically abused: Not on file    Forced sexual activity: Not on file  Other Topics Concern  . Not on file  Social History Narrative  . Not on file    FAMILY HISTORY: Family History  Problem Relation Age of Onset  . Other Father        No info about father or paternal relatives  . Diabetes Brother   . Pancreatitis Brother   . Prostate cancer Brother 56       currently 43 / maternal half-brother  . Breast cancer Maternal Grandmother 40       deceased 36s  . Breast cancer Maternal Aunt 65       currently 42  . Breast cancer Other 65       mother's sister; deceased 5  . Breast cancer Other        mother's sister; age at dx unknown    ALLERGIES:  is allergic to aspirin.  MEDICATIONS:  Current Outpatient Medications  Medication Sig Dispense Refill  . albuterol (PROVENTIL HFA;VENTOLIN HFA) 108 (90 Base) MCG/ACT inhaler Inhale 2 puffs into the lungs every 6 (six) hours as needed for wheezing or shortness of breath. 1 Inhaler 0  . anastrozole (ARIMIDEX) 1 MG tablet Take 1 tablet (1 mg total) by mouth daily. 30 tablet 3  . aspirin-acetaminophen-caffeine (EXCEDRIN MIGRAINE) 250-250-65 MG tablet Take 2 tablets by mouth daily as needed for headache.    . Blood Glucose Monitoring Suppl (FIFTY50 GLUCOSE METER 2.0) w/Device KIT Use as directed    . canagliflozin (INVOKANA) 300 MG TABS tablet Take 300 mg by mouth daily.    . Cholecalciferol (VITAMIN D3) 125 MCG (5000 UT) CAPS Take 1 capsule (5,000 Units total) by mouth daily with breakfast. Take along with calcium and magnesium. 100 capsule 0  . citalopram (CELEXA) 20 MG tablet Take 1 tablet (20 mg total) by mouth daily. Take in AM 30 tablet 2  . gabapentin (NEURONTIN) 300 MG  capsule Take 3 capsules (900 mg total) by mouth 4 (four) times daily. 1200 capsule 0  . glucose blood (ONE TOUCH ULTRA TEST) test strip Use up to 4 times/day 100 each 12  . Insulin Pen Needle 32G X 4 MM MISC 1 Units by Does not apply route every morning. Pen needles 90 each 3  . lidocaine-prilocaine (EMLA) cream Apply to affected area once 30 g 3  . Magnesium 500 MG CAPS Take 1 capsule (500 mg total) by mouth 2 (two) times daily  at 8 am and 10 pm. 200 capsule 0  . ondansetron (ZOFRAN) 8 MG tablet Take 1 tablet (8 mg total) by mouth 2 (two) times daily as needed. Start on the third day after chemotherapy. 30 tablet 1  . oxyCODONE (OXY IR/ROXICODONE) 5 MG immediate release tablet Take 1-2 tablets (5-10 mg total) by mouth every 6 (six) hours as needed for up to 30 days for severe pain. Must last 30 days 60 tablet 0  . prochlorperazine (COMPAZINE) 10 MG tablet Take 1 tablet (10 mg total) by mouth every 6 (six) hours as needed (Nausea or vomiting). 30 tablet 1  . traZODone (DESYREL) 50 MG tablet Take 1-2 tablets (50-100 mg total) by mouth at bedtime as needed for sleep. 60 tablet 2  . goserelin (ZOLADEX) 3.6 MG injection Inject 3.6 mg into the skin every 28 (twenty-eight) days.    . Insulin Glargine (BASAGLAR KWIKPEN) 100 UNIT/ML SOPN INJECT 0.24 MLS (24 UNITS TOTAL) INTO THE SKIN AT BEDTIME. (Patient taking differently: Inject 44 Units into the skin at bedtime. ) 15 pen 2   No current facility-administered medications for this visit.    Facility-Administered Medications Ordered in Other Visits  Medication Dose Route Frequency Provider Last Rate Last Dose  . heparin lock flush 100 unit/mL  500 Units Intravenous Once Corcoran, Melissa C, MD      . sodium chloride flush (NS) 0.9 % injection 10 mL  10 mL Intravenous Once Corcoran, Melissa C, MD          .  PHYSICAL EXAMINATION: ECOG PERFORMANCE STATUS: 0 - Asymptomatic  Vitals:   10/26/18 1400  BP: 114/76  Pulse: 76  Resp: 18  Temp: 98.2 F  (36.8 C)   Filed Weights   10/26/18 1359  Weight: 185 lb 3.2 oz (84 kg)    Physical Exam  Constitutional: She is oriented to person, place, and time and well-developed, well-nourished, and in no distress.  HENT:  Head: Normocephalic and atraumatic.  Mouth/Throat: Oropharynx is clear and moist. No oropharyngeal exudate.  Eyes: Pupils are equal, round, and reactive to light.  Neck: Normal range of motion. Neck supple.  Cardiovascular: Normal rate and regular rhythm.  Pulmonary/Chest: No respiratory distress. She has no wheezes.  Abdominal: Soft. Bowel sounds are normal. She exhibits no distension and no mass. There is no abdominal tenderness. There is no rebound and no guarding.  Musculoskeletal: Normal range of motion.        General: No tenderness or edema.  Neurological: She is alert and oriented to person, place, and time.  Skin: Skin is warm.  Psychiatric: Affect normal.     LABORATORY DATA:  I have reviewed the data as listed Lab Results  Component Value Date   WBC 5.5 10/26/2018   HGB 13.5 10/26/2018   HCT 41.5 10/26/2018   MCV 96.1 10/26/2018   PLT 197 10/26/2018   Recent Labs    11/23/17 1101 01/22/18 1200 02/02/18 0003 05/22/18 1146 07/18/18 1113  NA 138 139 135 139 138  K 3.8 4.2 3.8 3.7 3.7  CL 102 97 102 104 104  CO2 25  --  _0 GLUCOSE 226* 307* 337* 194* 312*  BUN _1 CREATININE 0.53 0.51* 0.46 0.56 0.61  CALCIUM 9.6 9.7 9.1 9.5 8.3*  GFRNONAA >60 112 >60 >60 >60  GFRAA >60 130 >60 >60 >60  PROT 6.3* 6.4 7.2 7.7  --   ALBUMIN 3.8 4.1 3.9 4.2  --  AST _0 --   ALT 27  --  14 18  --   ALKPHOS 78 103 89 94  --   BILITOT 0.9 0.4 0.5 0.6  --     RADIOGRAPHIC STUDIES: I have personally reviewed the radiological images as listed and agreed with the findings in the report. No results found.  ASSESSMENT & PLAN:   Malignant neoplasm of lower-outer quadrant of left breast of female, estrogen receptor positive ([place]) #  breast cancer Stage III ER/PR pos her 2 NEG. currently on anastrazole+ Zoldadex.  No clinical evidence of recurrence.  Stable.  #Continue current therapy; tolerating fairly well except for continued joint pains.    # Joint pains-overall stable.  Sec to AI-  on vit D/continue Osteo Bi-Flex/Neurontin.  # Depression/anxiety-on Celexa improved.  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  Stable  # DM-2 on insulin-stable  # DISPOSITION: # Follow up with Lauren in 1 week/ Zoladex injection.  #Follow-up in second week of dec 2020- MD;cbc/cmp/ca-27-29;Zoldadex/ [place] Dr.B  All questions were answered. The patient knows to call the clinic with any problems, questions or concerns.    Cammie Sickle, MD 10/29/2018 7:18 AM

## 2018-11-02 ENCOUNTER — Inpatient Hospital Stay (HOSPITAL_BASED_OUTPATIENT_CLINIC_OR_DEPARTMENT_OTHER): Payer: 59 | Admitting: Nurse Practitioner

## 2018-11-02 ENCOUNTER — Inpatient Hospital Stay: Payer: 59

## 2018-11-02 ENCOUNTER — Other Ambulatory Visit: Payer: Self-pay

## 2018-11-02 VITALS — BP 100/67 | HR 83 | Temp 95.3°F | Resp 18

## 2018-11-02 DIAGNOSIS — Z170 Estrogen receptor positive status [ER+]: Secondary | ICD-10-CM | POA: Diagnosis not present

## 2018-11-02 DIAGNOSIS — F323 Major depressive disorder, single episode, severe with psychotic features: Secondary | ICD-10-CM

## 2018-11-02 DIAGNOSIS — C50512 Malignant neoplasm of lower-outer quadrant of left female breast: Secondary | ICD-10-CM

## 2018-11-02 DIAGNOSIS — Z5111 Encounter for antineoplastic chemotherapy: Secondary | ICD-10-CM | POA: Diagnosis not present

## 2018-11-02 DIAGNOSIS — F19982 Other psychoactive substance use, unspecified with psychoactive substance-induced sleep disorder: Secondary | ICD-10-CM | POA: Diagnosis not present

## 2018-11-02 MED ORDER — ANASTROZOLE 1 MG PO TABS: 1.0000 mg | ORAL_TABLET | Freq: Every day | ORAL | 3 refills | Status: DC

## 2018-11-02 MED ORDER — GOSERELIN ACETATE 10.8 MG ~~LOC~~ IMPL
10.8000 mg | DRUG_IMPLANT | Freq: Once | SUBCUTANEOUS | Status: AC
  Administered 2018-11-02: 10.8 mg via SUBCUTANEOUS
  Filled 2018-11-02: qty 10.8

## 2018-11-02 NOTE — Progress Notes (Signed)
Symptom Management [facility]  [phone] [phone]  Patient Care Team: Patient, No Pcp Per as PCP - General (General Practice) [name], [name], MD (General Surgery) [name], [name], [name] (Obstetrics and Gynecology)   Name of the patient: Carolyn Roy  [id number]  1966-01-02   Date of visit: 11/02/18  Diagnosis- breast cancer  Chief complaint/ Reason for visit- depression & hallucinations  Heme/Onc history:  Oncology History Overview Note  # MAY 2019-  clinical stage IIIA (T3N1Mx) left breast cancer s/p biopsy on 06/14/2017. -Pathology revealed grade III invasive ductal carcinoma. -Axillary FNA revealed malignant cells c/w metastatic carcinoma. Tumor was ER + (90%), PR + (30%), Her2/neu - and Ki67 70%.  CA27.29 was 7.8 on 06/14/2017.  # She received 4 cycles of AC with Neulasta support (07/20/2017 - 08/31/2017).;  neoadjuvant Taxol on 09/14/2017.  #DEC 2019- Lumpectomy/sentinel lymph node biopsy [Dr.[name]]-complete pathologic response  # s/p RT [delayed sec to wound infection; Dr.[name]] finished RT [4/12]  # April 14th 2020- START TAM; stopped in mid-May secondary intolerance [severe migraines].  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 Secord/Berchuck]-negative for malignancy/ # DM-2- poorly controlled.   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   # PAP SMEAR- RECOMMENDED 2022- summer  -------------------------------------------  DIAGNOSIS: left breast cancer  STAGE:  III       ;GOALS: cure  CURRENT/MOST RECENT THERAPY Tam    Cancer of midline of breast, left (HCC)  06/15/2017 Initial Diagnosis   Cancer of midline of breast, left ([place])   06/26/2017 -  Chemotherapy   The  patient had DOXOrubicin (ADRIAMYCIN) chemo injection 122 mg, 60 mg/m2 = 122 mg, Intravenous,  Once, 4 of 4 cycles Administration: 122 mg (07/20/2017), 122 mg (08/03/2017), 122 mg (08/17/2017), 122 mg (08/31/2017) palonosetron (ALOXI) injection 0.25 mg, 0.25 mg, Intravenous,  Once, 4 of 4 cycles Administration: 0.25 mg (07/20/2017), 0.25 mg (08/03/2017), 0.25 mg (08/17/2017), 0.25 mg (08/31/2017) pegfilgrastim (NEULASTA) injection 6 mg, 6 mg, Subcutaneous, Once, 5 of 5 cycles Administration: 6 mg (07/21/2017), 6 mg (08/04/2017), 6 mg (08/18/2017), 6 mg (09/01/2017) cyclophosphamide (CYTOXAN) 1,220 mg in sodium chloride 0.9 % 250 mL chemo infusion, 600 mg/m2 = 1,220 mg, Intravenous,  Once, 4 of 4 cycles Administration: 1,220 mg (07/20/2017), 1,220 mg (08/03/2017), 1,220 mg (08/17/2017), 1,220 mg (08/31/2017) PACLitaxel (TAXOL) 162 mg in sodium chloride 0.9 % 250 mL chemo infusion (</= 79m/m2), 80 mg/m2 = 162 mg, Intravenous,  Once, 10 of 12 cycles Dose modification: 65 mg/m2 (original dose 80 mg/m2, Cycle 13, Reason: Provider Judgment, Comment: neuropathy) Administration: 162 mg (09/14/2017), 162 mg (09/21/2017), 162 mg (09/28/2017), 162 mg (10/05/2017), 162 mg (10/12/2017), 162 mg (10/19/2017), 162 mg (10/26/2017), 132 mg (11/02/2017), 132 mg (11/09/2017), 132 mg (11/16/2017) fosaprepitant (EMEND) 150 mg, dexamethasone (DECADRON) 12 mg in sodium chloride 0.9 % 145 mL IVPB, , Intravenous,  Once, 4 of 4 cycles Administration:  (07/20/2017),  (08/03/2017),  (08/17/2017),  (08/31/2017)  for chemotherapy treatment.    Malignant neoplasm of lower-outer quadrant of left breast of female, estrogen receptor positive [place]  11/08/2017 Initial Diagnosis   Malignant ne[place] of lower-outer quadrant of left breast of female, [place] positive [place]     Interval history- CPayton Roy 53year old female with history of depression, presents to Symptom Management  Clinic for follow up for depressive symptoms.  She was previously  seen by Billey Chang, NP on 09/27/2018 for symptoms and presents today for reevaluation.  She says that since starting Celexa and trazodone that symptoms are significantly improved and she is having less feelings of depression, anger, rage.  She continues to have auditory hallucinations but says she has learned to ignore them.  Sleeping better. She has not yet seen Dr. Nicolasa Ducking. She has not spoken to counselor yet.  She denies feelings of suicidality and feels she is tolerating medications well without significant side effects. She denies neurologic complaints.  Denies fever or illness.  Denies any easy bruising or bleeding.  Reports appetite is good and denies weight loss.  Denies chest pain.  Denies nausea, vomiting, constipation, diarrhea.  Denies urinary complaints.  No other specific complaints today. She will also receive her Zoladex injection today.  ECOG FS:1 - Symptomatic but completely ambulatory  Review of systems- Review of Systems  Constitutional: Negative for chills, fever, malaise/fatigue and weight loss.  HENT: Negative for hearing loss, nosebleeds, sore throat and tinnitus.   Eyes: Negative for blurred vision and double vision.  Respiratory: Negative for cough, hemoptysis, shortness of breath and wheezing.   Cardiovascular: Negative for chest pain, palpitations and leg swelling.  Gastrointestinal: Negative for abdominal pain, blood in stool, constipation, diarrhea, melena, nausea and vomiting.  Genitourinary: Negative for dysuria and urgency.  Musculoskeletal: Negative for back pain, falls, joint pain and myalgias.  Skin: Negative for itching and rash.  Neurological: Negative for dizziness, tingling, sensory change, loss of consciousness, weakness and headaches.  Endo/Heme/Allergies: Negative for environmental allergies. Does not bruise/bleed easily.  Psychiatric/Behavioral: Positive for hallucinations (Improved). Negative for depression (Improved), substance abuse and suicidal ideas.  The patient is nervous/anxious. The patient does not have insomnia (sleeping better).        Mood swings improved    Current treatment- zoladex  Allergies  Allergen Reactions  . Aspirin Nausea And Vomiting    Past Medical History:  Diagnosis Date  . Breast cancer ([place])   . Cancer ([place]) 06/15/2017   5.1 cm, T3,N1 (clinical): ER/ PR positive, Her 2 neu not overexpressed, High Ki 67. Neuoadjuvant chemotherapy.   . Depression   . Diabetes mellitus without complication ([place]) 4709  . Endometriosis   . Family history of breast cancer   . Headache    migraines  . Hyperlipidemia   . Ovarian mass   . Personal history of chemotherapy   . Personal history of radiation therapy   . Pneumonia    2018    Past Surgical History:  Procedure Laterality Date  . AXILLARY LYMPH NODE BIOPSY Left 07/14/2017   Procedure: INSERTION GEL MARK CLIP LEFT AXILLA;  Surgeon: Robert Bellow, MD;  Location: [facility] ORS;  Service: General;  Laterality: Left;  . BREAST BIOPSY Left    Dr Orlene Och BREAST METASTATIC CARCINOMA  . BREAST LUMPECTOMY Left 01/12/2018  . OOPHORECTOMY    . PARTIAL MASTECTOMY WITH NEEDLE LOCALIZATION Left 01/12/2018   Procedure: PARTIAL MASTECTOMY WITH NEEDLE LOCALIZATION;  Surgeon: Robert Bellow, MD;  Location: [facility] ORS;  Service: General;  Laterality: Left;  . PORTACATH PLACEMENT Right 07/14/2017   Procedure: INSERTION PORT-A-CATH;  Surgeon: Robert Bellow, MD;  Location: [facility] ORS;  Service: General;  Laterality: Right;  . SENTINEL NODE BIOPSY Left 01/12/2018   Procedure: SENTINEL NODE BIOPSY;  Surgeon: Robert Bellow, MD;  Location: [facility] ORS;  Service: General;  Laterality: Left;  . TUBAL  LIGATION      Social History   Socioeconomic History  . Marital status: Single    Spouse name: Not on file  . Number of children: Not on file  . Years of education: Not on file  . Highest education level: Not on file  Occupational History  . Not on file  Social Needs  .  Financial resource strain: Not on file  . Food insecurity    Worry: Not on file    Inability: Not on file  . Transportation needs    Medical: Not on file    Non-medical: Not on file  Tobacco Use  . Smoking status: Current Every Day Smoker    Packs/day: 0.50    Years: 11.00    Pack years: 5.50    Types: Cigarettes  . Smokeless tobacco: Former Systems developer    Types: Snuff  Substance and Sexual Activity  . Alcohol use: No    Alcohol/week: 0.0 standard drinks  . Drug use: No  . Sexual activity: Yes  Lifestyle  . Physical activity    Days per week: Not on file    Minutes per session: Not on file  . Stress: Not on file  Relationships  . Social Herbalist on phone: Not on file    Gets together: Not on file    Attends religious service: Not on file    Active member of club or organization: Not on file    Attends meetings of clubs or organizations: Not on file    Relationship status: Not on file  . Intimate partner violence    Fear of current or ex partner: Not on file    Emotionally abused: Not on file    Physically abused: Not on file    Forced sexual activity: Not on file  Other Topics Concern  . Not on file  Social History Narrative  . Not on file    Family History  Problem Relation Age of Onset  . Other Father        No info about father or paternal relatives  . Diabetes Brother   . Pancreatitis Brother   . Prostate cancer Brother 22       currently 25 / maternal half-brother  . Breast cancer Maternal Grandmother 40       deceased 31s  . Breast cancer Maternal Aunt 65       currently 27  . Breast cancer Other 50       mother's sister; deceased 18  . Breast cancer Other        mother's sister; age at dx unknown     Current Outpatient Medications:  .  albuterol (PROVENTIL HFA;VENTOLIN HFA) 108 (90 Base) MCG/ACT inhaler, Inhale 2 puffs into the lungs every 6 (six) hours as needed for wheezing or shortness of breath., Disp: 1 Inhaler, Rfl: 0 .  anastrozole  (ARIMIDEX) 1 MG tablet, Take 1 tablet (1 mg total) by mouth daily., Disp: 30 tablet, Rfl: 3 .  aspirin-acetaminophen-caffeine (EXCEDRIN MIGRAINE) 250-250-65 MG tablet, Take 2 tablets by mouth daily as needed for headache., Disp: , Rfl:  .  Blood Glucose Monitoring Suppl (FIFTY50 GLUCOSE METER 2.0) w/Device KIT, Use as directed, Disp: , Rfl:  .  canagliflozin (INVOKANA) 300 MG TABS tablet, Take 300 mg by mouth daily., Disp: , Rfl:  .  Cholecalciferol (VITAMIN D3) 125 MCG (5000 UT) CAPS, Take 1 capsule (5,000 Units total) by mouth daily with breakfast. Take along with calcium and magnesium., Disp:  100 capsule, Rfl: 0 .  citalopram (CELEXA) 20 MG tablet, Take 1 tablet (20 mg total) by mouth daily. Take in AM, Disp: 30 tablet, Rfl: 2 .  gabapentin (NEURONTIN) 300 MG capsule, Take 3 capsules (900 mg total) by mouth 4 (four) times daily., Disp: 1200 capsule, Rfl: 0 .  glucose blood (ONE TOUCH ULTRA TEST) test strip, Use up to 4 times/day, Disp: 100 each, Rfl: 12 .  goserelin (ZOLADEX) 3.6 MG injection, Inject 3.6 mg into the skin every 28 (twenty-eight) days., Disp: , Rfl:  .  Insulin Glargine (BASAGLAR KWIKPEN) 100 UNIT/ML SOPN, INJECT 0.24 MLS (24 UNITS TOTAL) INTO THE SKIN AT BEDTIME. (Patient taking differently: Inject 44 Units into the skin at bedtime. ), Disp: 15 pen, Rfl: 2 .  Insulin Pen Needle 32G X 4 MM MISC, 1 Units by Does not apply route every morning. Pen needles, Disp: 90 each, Rfl: 3 .  lidocaine-prilocaine (EMLA) cream, Apply to affected area once, Disp: 30 g, Rfl: 3 .  Magnesium 500 MG CAPS, Take 1 capsule (500 mg total) by mouth 2 (two) times daily at 8 am and 10 pm., Disp: 200 capsule, Rfl: 0 .  ondansetron (ZOFRAN) 8 MG tablet, Take 1 tablet (8 mg total) by mouth 2 (two) times daily as needed. Start on the third day after chemotherapy., Disp: 30 tablet, Rfl: 1 .  oxyCODONE (OXY IR/ROXICODONE) 5 MG immediate release tablet, Take 1-2 tablets (5-10 mg total) by mouth every 6 (six) hours as  needed for up to 30 days for severe pain. Must last 30 days, Disp: 60 tablet, Rfl: 0 .  prochlorperazine (COMPAZINE) 10 MG tablet, Take 1 tablet (10 mg total) by mouth every 6 (six) hours as needed (Nausea or vomiting)., Disp: 30 tablet, Rfl: 1 .  traZODone (DESYREL) 50 MG tablet, Take 1-2 tablets (50-100 mg total) by mouth at bedtime as needed for sleep., Disp: 60 tablet, Rfl: 2 No current facility-administered medications for this visit.   Facility-Administered Medications Ordered in Other Visits:  .  heparin lock flush 100 unit/mL, 500 Units, Intravenous, Once, Corcoran, Melissa C, MD .  sodium chloride flush (NS) 0.9 % injection 10 mL, 10 mL, Intravenous, Once, Lequita Asal, MD  Physical exam:  Vitals:   11/02/18 1405  BP: 100/67  Pulse: 83  Resp: 18  Temp: (!) 95.3 F (35.2 C)  TempSrc: Tympanic   Physical Exam Constitutional:      Appearance: She is well-developed.  HENT:     Head: Atraumatic.     Nose: Nose normal.     Mouth/Throat:     Pharynx: No oropharyngeal exudate.  Eyes:     General: No scleral icterus.    Conjunctiva/sclera: Conjunctivae normal.     Pupils: Pupils are equal, round, and reactive to light.  Neck:     Musculoskeletal: Normal range of motion and neck supple.  Cardiovascular:     Rate and Rhythm: Normal rate and regular rhythm.  Pulmonary:     Effort: Pulmonary effort is normal.     Breath sounds: Normal breath sounds.  Abdominal:     General: Bowel sounds are normal. There is no distension.     Palpations: Abdomen is soft.  Musculoskeletal: Normal range of motion.  Skin:    General: Skin is warm and dry.  Neurological:     Mental Status: She is alert and oriented to person, place, and time.     Gait: Gait normal.  Psychiatric:  Mood and Affect: Mood normal.        Behavior: Behavior normal.      CMP Latest Ref Rng & Units 07/18/2018  Glucose 70 - 99 mg/dL 312(H)  BUN 6 - 20 mg/dL 8  Creatinine 0.44 - 1.00 mg/dL 0.61   Sodium 135 - 145 mmol/L 138  Potassium 3.5 - 5.1 mmol/L 3.7  Chloride 98 - 111 mmol/L 104  CO2 22 - 32 mmol/L 25  Calcium 8.9 - 10.3 mg/dL 8.3(L)  Total Protein 6.5 - 8.1 g/dL -  Total Bilirubin 0.3 - 1.2 mg/dL -  Alkaline Phos 38 - 126 U/L -  AST 15 - 41 U/L -  ALT 0 - 44 U/L -   CBC Latest Ref Rng & Units 10/26/2018  WBC 4.0 - 10.5 K/uL 5.5  Hemoglobin 12.0 - 15.0 g/dL 13.5  Hematocrit 36.0 - 46.0 % 41.5  Platelets 150 - 400 K/uL 197    No images are attached to the encounter.  No results found.  Assessment and plan- Patient is a 53 y.o. female diagnosis/left breast cancer, presents to symptom management clinic for reevaluation of depression and anxiety.  1.  Left breast cancer-stage III ER/PR positive HER-2/neu negative.  Currently on anastrozole plus Zoladex.  Labs reviewed and acceptable to proceed with Zoladex today.  Clinically no evidence of progression.  Tolerating treatment well.  Plan to repeat Zoladex in 3 months.  2.  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  Improved though not resolved.  Again encouraged patient to reach out to psychiatry for appointment. Provided patient phone number for insurance authorizations with psychiatry office today.  Currently on Celexa.  Tolerating well.  Symptoms improved.  Continue Celexa  3.  Insomnia-Zoladex may be contributing.  Insomnia reported in 5 to 11% of patients per up-to-date.  Continue trazodone  Disposition: Proceed with Zoladex today. Follow [place] as scheduled Return to clinic in 1 month for reevaluation  Visit Diagnosis 1. Malignant neoplasm of lower-outer quadrant of left breast of female, estrogen receptor positive ([place])   2. Current severe episode of major depressive disorder with psychotic features, unspecified whether recurrent ([place])   3. Insomnia due to drug [facility])     Patient expressed understanding and was in agreement with  this plan. She also understands that She can call clinic at any time with any questions, concerns, or complaints.   Thank you for allowing me to participate in the care of this very pleasant patient.   Beckey Rutter, DNP, AGNP-[facility]

## 2018-11-16 IMAGING — US US PELVIS COMPLETE WITH TRANSVAGINAL
1 series · 13 of 25 positions shown · non-contrast
Comparison: None

CLINICAL DATA: Thickened endometrial complex and nabothian cysts,
follow-up

EXAM:
TRANSABDOMINAL AND TRANSVAGINAL ULTRASOUND OF PELVIS
TECHNIQUE: Both transabdominal and transvaginal ultrasound examinations of the
pelvis were performed. Transabdominal technique was performed for
global imaging of the pelvis including uterus, ovaries, adnexal
regions, and pelvic cul-de-sac. It was necessary to proceed with
endovaginal exam following the transabdominal exam to visualize the
endometrial complex and ovaries.

[Series 1: us pelvis complete with transvaginal · 0.21mm/px · 13 of 81 slices shown]
[im 1/81]
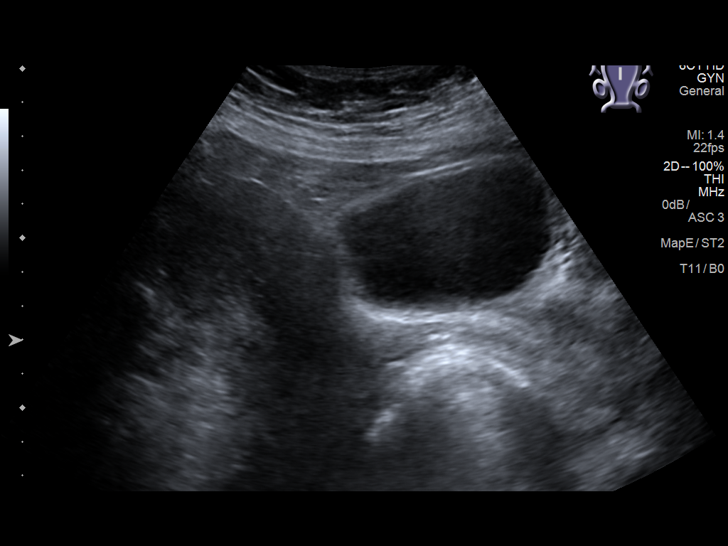
[im 7/81]
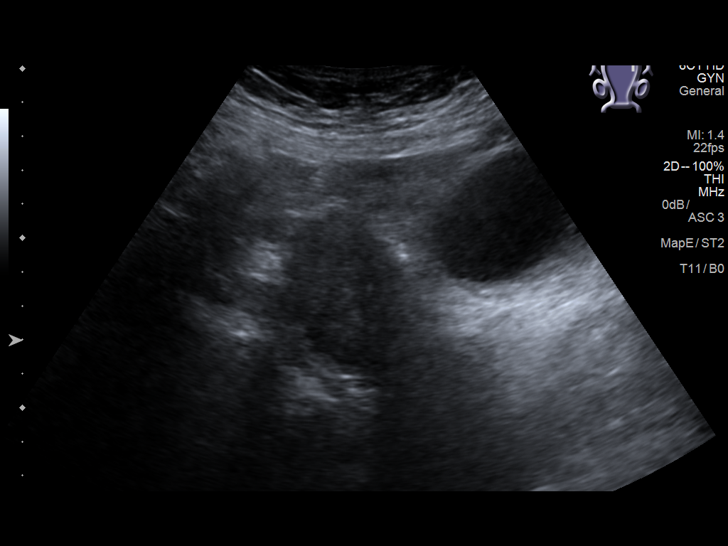
[im 14/81]
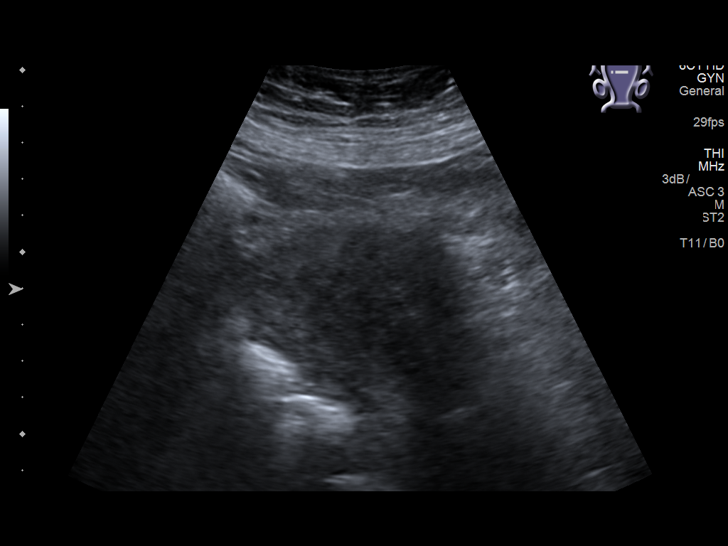
[im 21/81]
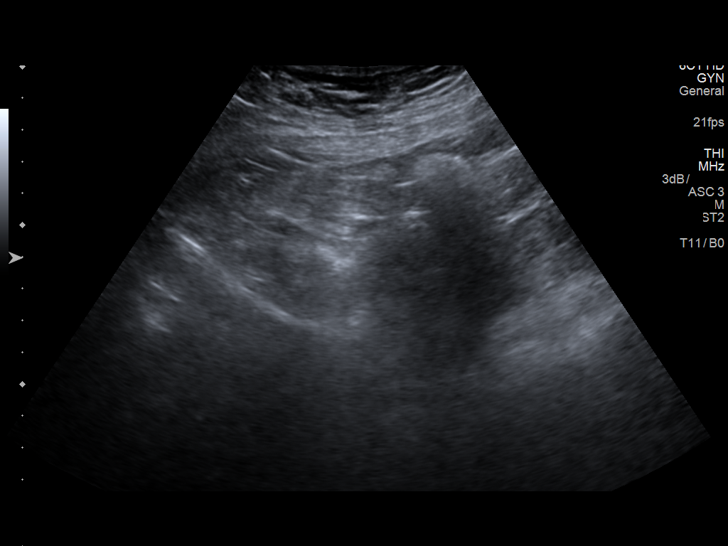
[im 27/81]
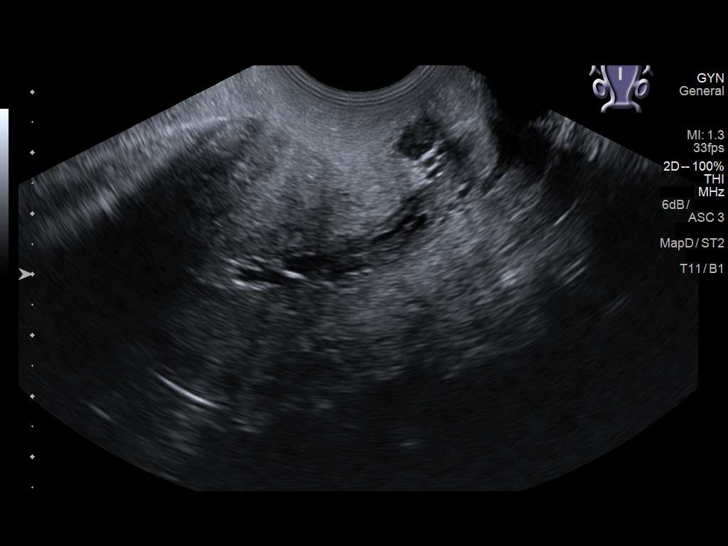
[im 34/81]
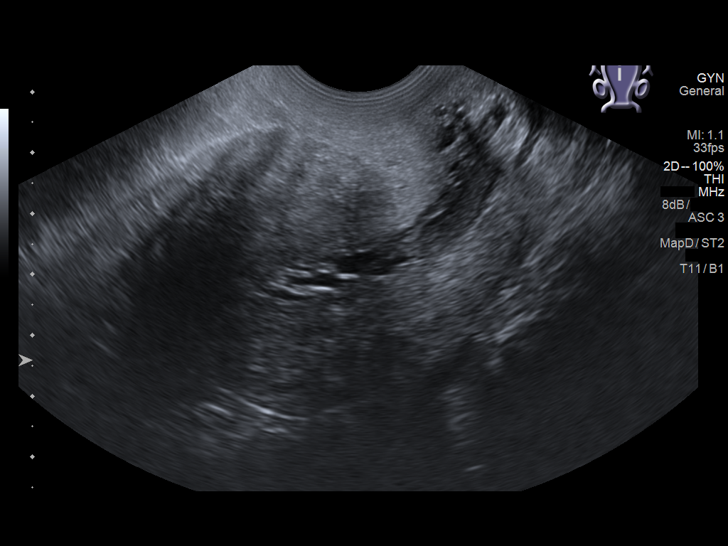
[im 41/81]
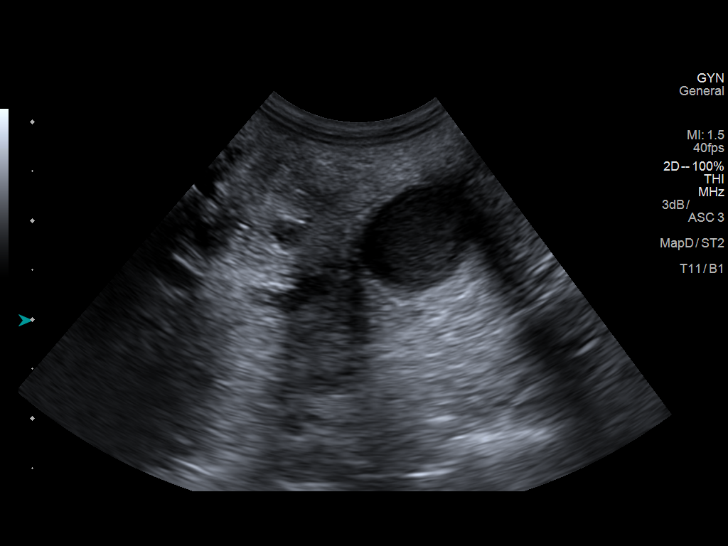
[im 47/81]
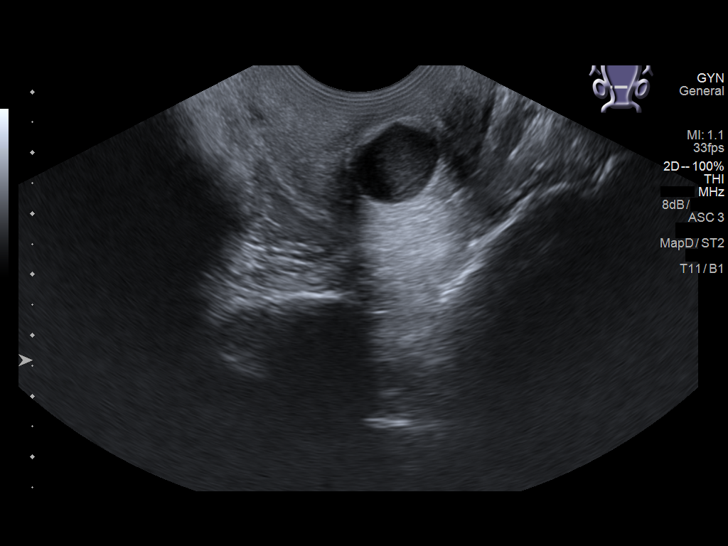
[im 54/81]
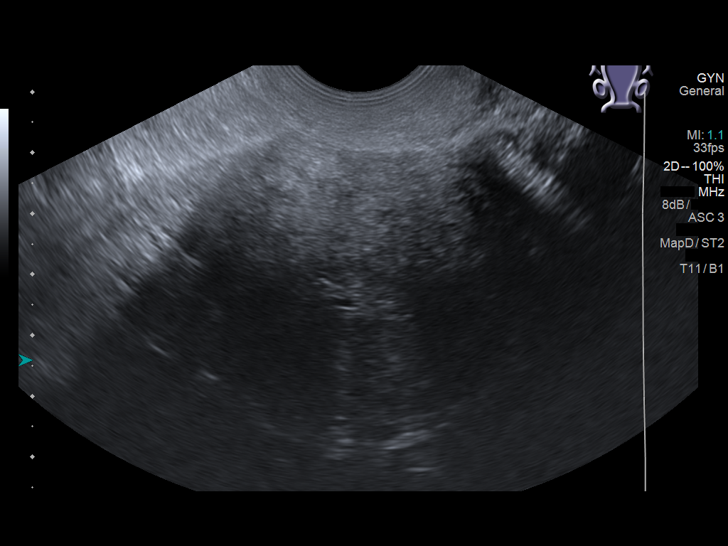
[im 61/81]
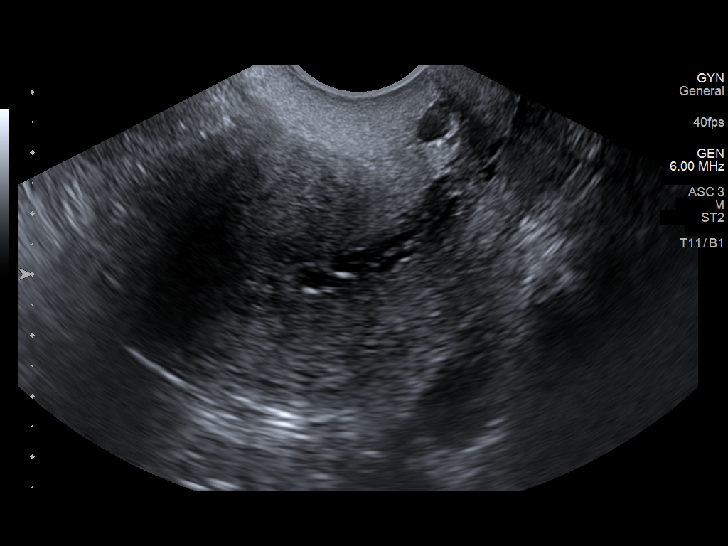
[im 67/81]
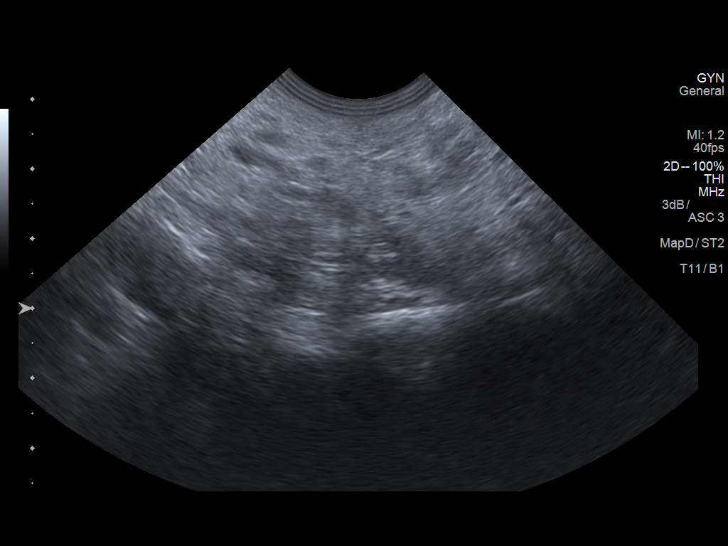
[im 74/81]
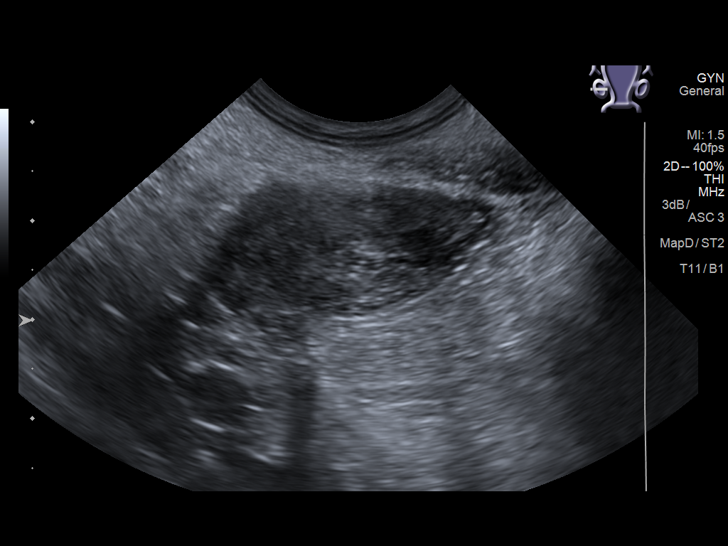
[im 81/81]
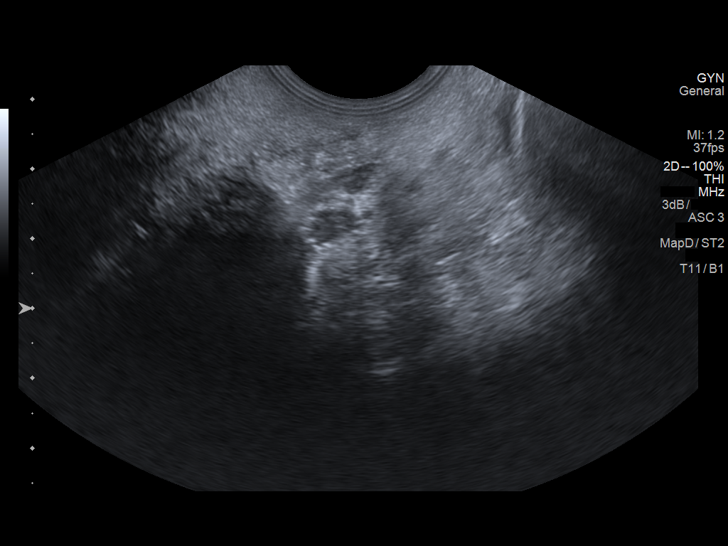

[13 of 25 positions shown; findings below may reference images not displayed]

FINDINGS: Uterus

Measurements: 8.6 x 5.0 x 6.1 cm = volume: 139.1 mL. Mildly
heterogeneous myometrium without focal myometrial mass. Nabothian
cysts at cervix.

Endometrium

Thickness: 6 mm. Complex appearance, heterogeneous, with hypoechoic
areas of fluid and some intermediate echogenicity foci, not
completely exclude mass lesion within the endometrial complex at the
mid uterus.

Right ovary

Measurements: 2.8 x 1.4 x 2.6 cm = volume: 5.3 mL. Normal morphology
without mass

Left ovary

Not visualized on either transabdominal or endovaginal imaging,
likely obscured by bowel

Other findings

No free pelvic fluid.  No adnexal masses.
IMPRESSION: Complex heterogeneous endometrial complex with small amount of
endometrial fluid and a questionable area of focal abnormality
question mass at mid uterus; recommend sonohysterogram for further
evaluation, prior to hysteroscopy or endometrial biopsy.

## 2018-11-30 ENCOUNTER — Inpatient Hospital Stay: Payer: 59 | Attending: Nurse Practitioner | Admitting: Nurse Practitioner

## 2018-11-30 ENCOUNTER — Other Ambulatory Visit: Payer: Self-pay

## 2018-11-30 VITALS — BP 113/73 | HR 79 | Temp 96.3°F | Resp 18 | Wt 181.6 lb

## 2018-11-30 DIAGNOSIS — G8929 Other chronic pain: Secondary | ICD-10-CM | POA: Diagnosis not present

## 2018-11-30 DIAGNOSIS — F323 Major depressive disorder, single episode, severe with psychotic features: Secondary | ICD-10-CM | POA: Diagnosis not present

## 2018-11-30 DIAGNOSIS — F17210 Nicotine dependence, cigarettes, uncomplicated: Secondary | ICD-10-CM | POA: Diagnosis not present

## 2018-11-30 DIAGNOSIS — Z923 Personal history of irradiation: Secondary | ICD-10-CM | POA: Insufficient documentation

## 2018-11-30 DIAGNOSIS — R440 Auditory hallucinations: Secondary | ICD-10-CM | POA: Insufficient documentation

## 2018-11-30 DIAGNOSIS — Z170 Estrogen receptor positive status [ER+]: Secondary | ICD-10-CM | POA: Diagnosis not present

## 2018-11-30 DIAGNOSIS — Z79899 Other long term (current) drug therapy: Secondary | ICD-10-CM | POA: Diagnosis not present

## 2018-11-30 DIAGNOSIS — Z90721 Acquired absence of ovaries, unilateral: Secondary | ICD-10-CM | POA: Insufficient documentation

## 2018-11-30 DIAGNOSIS — Z8379 Family history of other diseases of the digestive system: Secondary | ICD-10-CM | POA: Insufficient documentation

## 2018-11-30 DIAGNOSIS — C50812 Malignant neoplasm of overlapping sites of left female breast: Secondary | ICD-10-CM | POA: Diagnosis not present

## 2018-11-30 DIAGNOSIS — Z800 Family history of malignant neoplasm of digestive organs: Secondary | ICD-10-CM | POA: Insufficient documentation

## 2018-11-30 DIAGNOSIS — Z803 Family history of malignant neoplasm of breast: Secondary | ICD-10-CM | POA: Diagnosis not present

## 2018-11-30 DIAGNOSIS — F329 Major depressive disorder, single episode, unspecified: Secondary | ICD-10-CM | POA: Diagnosis not present

## 2018-11-30 DIAGNOSIS — Z9221 Personal history of antineoplastic chemotherapy: Secondary | ICD-10-CM | POA: Insufficient documentation

## 2018-11-30 DIAGNOSIS — Z79811 Long term (current) use of aromatase inhibitors: Secondary | ICD-10-CM | POA: Diagnosis not present

## 2018-11-30 DIAGNOSIS — Z833 Family history of diabetes mellitus: Secondary | ICD-10-CM | POA: Diagnosis not present

## 2018-11-30 DIAGNOSIS — Z886 Allergy status to analgesic agent status: Secondary | ICD-10-CM | POA: Diagnosis not present

## 2018-11-30 NOTE — Progress Notes (Signed)
Symptom Management Progress  [phone] [phone]  Patient Care Team: Patient, No Pcp Per as PCP - General (General Practice) [name], [name], MD (General Surgery) [name], [name], [name] (Obstetrics and Gynecology)   Name of the patient: Carolyn Roy  [id number]  [birth year]   Date of visit: 11/30/18  Diagnosis- breast cancer  Chief complaint/ Reason for visit- depression & hallucinations  Heme/Onc history:  Oncology History Overview Note  # MAY 2019-  clinical stage IIIA (T3N1Mx) left breast cancer s/p biopsy on 06/14/2017. -Pathology revealed grade III invasive ductal carcinoma. -Axillary FNA revealed malignant cells c/w metastatic carcinoma. Tumor was ER + (90%), PR + (30%), Her2/neu - and Ki67 70%.  CA27.29 was 7.8 on 06/14/2017.  # She received 4 cycles of AC with Neulasta support (07/20/2017 - 08/31/2017).;  neoadjuvant Taxol on 09/14/2017.  #DEC 2019- Lumpectomy/sentinel lymph node biopsy [Dr.[name]]-complete pathologic response  # s/p RT [delayed sec to wound infection; Dr.[name]] finished RT [4/12]  # April 14th 2020- START TAM; stopped in mid-May secondary intolerance [severe migraines].  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 Secord/Berchuck]-negative for malignancy/ # DM-2- poorly controlled.   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   # PAP SMEAR- RECOMMENDED 2022- summer  -------------------------------------------  DIAGNOSIS: left breast cancer  STAGE:  III       ;GOALS: cure  CURRENT/MOST RECENT THERAPY Tam    Cancer of midline of breast, left (HCC)  06/15/2017 Initial Diagnosis   Cancer of midline of breast, left ([place])   06/26/2017 -  Chemotherapy   The  patient had DOXOrubicin (ADRIAMYCIN) chemo injection 122 mg, 60 mg/m2 = 122 mg, Intravenous,  Once, 4 of 4 cycles Administration: 122 mg (07/20/2017), 122 mg (08/03/2017), 122 mg (08/17/2017), 122 mg (08/31/2017) palonosetron (ALOXI) injection 0.25 mg, 0.25 mg, Intravenous,  Once, 4 of 4 cycles Administration: 0.25 mg (07/20/2017), 0.25 mg (08/03/2017), 0.25 mg (08/17/2017), 0.25 mg (08/31/2017) pegfilgrastim (NEULASTA) injection 6 mg, 6 mg, Subcutaneous, Once, 5 of 5 cycles Administration: 6 mg (07/21/2017), 6 mg (08/04/2017), 6 mg (08/18/2017), 6 mg (09/01/2017) cyclophosphamide (CYTOXAN) 1,220 mg in sodium chloride 0.9 % 250 mL chemo infusion, 600 mg/m2 = 1,220 mg, Intravenous,  Once, 4 of 4 cycles Administration: 1,220 mg (07/20/2017), 1,220 mg (08/03/2017), 1,220 mg (08/17/2017), 1,220 mg (08/31/2017) PACLitaxel (TAXOL) 162 mg in sodium chloride 0.9 % 250 mL chemo infusion (</= 77m/m2), 80 mg/m2 = 162 mg, Intravenous,  Once, 10 of 12 cycles Dose modification: 65 mg/m2 (original dose 80 mg/m2, Cycle 13, Reason: Provider Judgment, Comment: neuropathy) Administration: 162 mg (09/14/2017), 162 mg (09/21/2017), 162 mg (09/28/2017), 162 mg (10/05/2017), 162 mg (10/12/2017), 162 mg (10/19/2017), 162 mg (10/26/2017), 132 mg (11/02/2017), 132 mg (11/09/2017), 132 mg (11/16/2017) fosaprepitant (EMEND) 150 mg, dexamethasone (DECADRON) 12 mg in sodium chloride 0.9 % 145 mL IVPB, , Intravenous,  Once, 4 of 4 cycles Administration:  (07/20/2017),  (08/03/2017),  (08/17/2017),  (08/31/2017)  for chemotherapy treatment.    Malignant neoplasm of lower-outer quadrant of left breast of female, estrogen receptor positive ([facility]  11/08/2017 Initial Diagnosis   Malignant neoplasm of lower-outer quadrant of left breast of female, estrogen receptor positive ([facility]     Interval history- Carolyn Roy 53year old female with above history of breast cancer, presents to  [facility] Management Clinic who presents to symptom management clinic for  follow-up for depression and hallucinations.  She continues Celexa and trazodone and symptoms have improved.  She has less feelings of depression, anger, and rage.  She continues to have auditory hallucinations but says that she has learned to ignore them.  Sleep is improved.  She has not yet seen Dr. Nicolasa Ducking and has not yet seen counseling.  She says that she has is worried of costs of more doctors. Sleeping is stable and overall improved. She denies feelings of self harm or harm to others. She denies side effects. She denies dizziness, weakness, fever or chills. She denies easy bleeding or bruising. Appetite is stable and she denies weight loss. She denies nausea, vomiting, constipation, or diarrhea. She denies urinary complaints. She continues to be followed by Dr. Dossie Arbour for chronic pain.   ECOG FS:1 - Symptomatic but completely ambulatory  Review of systems- Review of Systems  Constitutional: Negative for chills, fever, malaise/fatigue and weight loss.  HENT: Negative for hearing loss, nosebleeds, sore throat and tinnitus.   Eyes: Negative for blurred vision and double vision.  Respiratory: Negative for cough, hemoptysis, shortness of breath and wheezing.   Cardiovascular: Negative for chest pain, palpitations and leg swelling.  Gastrointestinal: Negative for abdominal pain, blood in stool, constipation, diarrhea, melena, nausea and vomiting.  Genitourinary: Negative for dysuria and urgency.  Musculoskeletal: Negative for back pain, falls, joint pain and myalgias.  Skin: Negative for itching and rash.  Neurological: Negative for dizziness, tingling, sensory change, loss of consciousness, weakness and headaches.  Endo/Heme/Allergies: Negative for environmental allergies. Does not bruise/bleed easily.  Psychiatric/Behavioral: Positive for hallucinations. Negative for depression, substance abuse and suicidal ideas. The patient is not nervous/anxious and does not have insomnia.        Mood swings-  improved    Current treatment- anastrazole + zoladex  Allergies  Allergen Reactions  . Aspirin Nausea And Vomiting    Past Medical History:  Diagnosis Date  . Breast cancer ([place])   . Cancer ([place]) 06/15/2017   5.1 cm, T3,N1 (clinical): ER/ PR positive, Her 2 neu not overexpressed, High Ki 67. Neuoadjuvant chemotherapy.   . Depression   . Diabetes mellitus without complication ([place]) 4163  . Endometriosis   . Family history of breast cancer   . Headache    migraines  . Hyperlipidemia   . Ovarian mass   . Personal history of chemotherapy   . Personal history of radiation therapy   . Pneumonia    2018    Past Surgical History:  Procedure Laterality Date  . AXILLARY LYMPH NODE BIOPSY Left 07/14/2017   Procedure: INSERTION GEL MARK CLIP LEFT AXILLA;  Surgeon: Robert Bellow, MD;  Location: [facility] ORS;  Service: General;  Laterality: Left;  . BREAST BIOPSY Left    Dr Orlene Och BREAST METASTATIC CARCINOMA  . BREAST LUMPECTOMY Left 01/12/2018  . OOPHORECTOMY    . PARTIAL MASTECTOMY WITH NEEDLE LOCALIZATION Left 01/12/2018   Procedure: PARTIAL MASTECTOMY WITH NEEDLE LOCALIZATION;  Surgeon: Robert Bellow, MD;  Location: [facility] ORS;  Service: General;  Laterality: Left;  . PORTACATH PLACEMENT Right 07/14/2017   Procedure: INSERTION PORT-A-CATH;  Surgeon: Robert Bellow, MD;  Location: [facility] ORS;  Service: General;  Laterality: Right;  . SENTINEL NODE BIOPSY Left 01/12/2018   Procedure: SENTINEL NODE BIOPSY;  Surgeon: Robert Bellow, MD;  Location: [facility] ORS;  Service: General;  Laterality: Left;  . TUBAL LIGATION      Social  History   Socioeconomic History  . Marital status: Single    Spouse name: Not on file  . Number of children: Not on file  . Years of education: Not on file  . Highest education level: Not on file  Occupational History  . Not on file  Social Needs  . Financial resource strain: Not on file  . Food insecurity    Worry: Not on file    Inability:  Not on file  . Transportation needs    Medical: Not on file    Non-medical: Not on file  Tobacco Use  . Smoking status: Current Every Day Smoker    Packs/day: 0.50    Years: 11.00    Pack years: 5.50    Types: Cigarettes  . Smokeless tobacco: Former Systems developer    Types: Snuff  Substance and Sexual Activity  . Alcohol use: No    Alcohol/week: 0.0 standard drinks  . Drug use: No  . Sexual activity: Yes  Lifestyle  . Physical activity    Days per week: Not on file    Minutes per session: Not on file  . Stress: Not on file  Relationships  . Social Herbalist on phone: Not on file    Gets together: Not on file    Attends religious service: Not on file    Active member of club or organization: Not on file    Attends meetings of clubs or organizations: Not on file    Relationship status: Not on file  . Intimate partner violence    Fear of current or ex partner: Not on file    Emotionally abused: Not on file    Physically abused: Not on file    Forced sexual activity: Not on file  Other Topics Concern  . Not on file  Social History Narrative  . Not on file    Family History  Problem Relation Age of Onset  . Other Father        No info about father or paternal relatives  . Diabetes Brother   . Pancreatitis Brother   . Prostate cancer Brother 10       currently 72 / maternal half-brother  . Breast cancer Maternal Grandmother 40       deceased 28s  . Breast cancer Maternal Aunt 65       currently 26  . Breast cancer Other 85       mother's sister; deceased 42  . Breast cancer Other        mother's sister; age at dx unknown     Current Outpatient Medications:  .  albuterol (PROVENTIL HFA;VENTOLIN HFA) 108 (90 Base) MCG/ACT inhaler, Inhale 2 puffs into the lungs every 6 (six) hours as needed for wheezing or shortness of breath., Disp: 1 Inhaler, Rfl: 0 .  anastrozole (ARIMIDEX) 1 MG tablet, Take 1 tablet (1 mg total) by mouth daily., Disp: 90 tablet, Rfl: 3 .   aspirin-acetaminophen-caffeine (EXCEDRIN MIGRAINE) 250-250-65 MG tablet, Take 2 tablets by mouth daily as needed for headache., Disp: , Rfl:  .  Blood Glucose Monitoring Suppl (FIFTY50 GLUCOSE METER 2.0) w/Device KIT, Use as directed, Disp: , Rfl:  .  canagliflozin (INVOKANA) 300 MG TABS tablet, Take 300 mg by mouth daily., Disp: , Rfl:  .  Cholecalciferol (VITAMIN D3) 125 MCG (5000 UT) CAPS, Take 1 capsule (5,000 Units total) by mouth daily with breakfast. Take along with calcium and magnesium., Disp: 100 capsule, Rfl: 0 .  citalopram (  CELEXA) 20 MG tablet, Take 1 tablet (20 mg total) by mouth daily. Take in AM, Disp: 30 tablet, Rfl: 2 .  gabapentin (NEURONTIN) 300 MG capsule, Take 3 capsules (900 mg total) by mouth 4 (four) times daily., Disp: 1200 capsule, Rfl: 0 .  glucose blood (ONE TOUCH ULTRA TEST) test strip, Use up to 4 times/day, Disp: 100 each, Rfl: 12 .  goserelin (ZOLADEX) 3.6 MG injection, Inject 3.6 mg into the skin every 28 (twenty-eight) days., Disp: , Rfl:  .  Insulin Glargine (BASAGLAR KWIKPEN) 100 UNIT/ML SOPN, INJECT 0.24 MLS (24 UNITS TOTAL) INTO THE SKIN AT BEDTIME. (Patient taking differently: Inject 44 Units into the skin at bedtime. ), Disp: 15 pen, Rfl: 2 .  Insulin Pen Needle 32G X 4 MM MISC, 1 Units by Does not apply route every morning. Pen needles, Disp: 90 each, Rfl: 3 .  lidocaine-prilocaine (EMLA) cream, Apply to affected area once, Disp: 30 g, Rfl: 3 .  Magnesium 500 MG CAPS, Take 1 capsule (500 mg total) by mouth 2 (two) times daily at 8 am and 10 pm., Disp: 200 capsule, Rfl: 0 .  ondansetron (ZOFRAN) 8 MG tablet, Take 1 tablet (8 mg total) by mouth 2 (two) times daily as needed. Start on the third day after chemotherapy., Disp: 30 tablet, Rfl: 1 .  oxyCODONE (OXY IR/ROXICODONE) 5 MG immediate release tablet, Take 1-2 tablets (5-10 mg total) by mouth every 6 (six) hours as needed for up to 30 days for severe pain. Must last 30 days, Disp: 60 tablet, Rfl: 0 .   prochlorperazine (COMPAZINE) 10 MG tablet, Take 1 tablet (10 mg total) by mouth every 6 (six) hours as needed (Nausea or vomiting)., Disp: 30 tablet, Rfl: 1 .  traZODone (DESYREL) 50 MG tablet, Take 1-2 tablets (50-100 mg total) by mouth at bedtime as needed for sleep., Disp: 60 tablet, Rfl: 2 No current facility-administered medications for this visit.   Facility-Administered Medications Ordered in Other Visits:  .  heparin lock flush 100 unit/mL, 500 Units, Intravenous, Once, Corcoran, Melissa C, MD .  sodium chloride flush (NS) 0.9 % injection 10 mL, 10 mL, Intravenous, Once, Lequita Asal, MD  Physical exam:  Vitals:   11/30/18 1037  Resp: 18  Temp: (!) 96.3 F (35.7 C)  TempSrc: Tympanic  Weight: 181 lb 9.6 oz (82.4 kg)   Physical Exam Constitutional:      General: She is not in acute distress.    Appearance: Normal appearance.  HENT:     Head: Normocephalic and atraumatic.  Eyes:     General: No scleral icterus.    Conjunctiva/sclera: Conjunctivae normal.  Cardiovascular:     Rate and Rhythm: Normal rate and regular rhythm.  Pulmonary:     Effort: Pulmonary effort is normal. No respiratory distress.     Breath sounds: Normal breath sounds.  Skin:    General: Skin is warm and dry.  Neurological:     Mental Status: She is alert and oriented to person, place, and time.     Motor: No weakness.     Gait: Gait normal.  Psychiatric:        Mood and Affect: Mood normal.        Behavior: Behavior normal.        Thought Content: Thought content normal.      CMP Latest Ref Rng & Units 07/18/2018  Glucose 70 - 99 mg/dL 312(H)  BUN 6 - 20 mg/dL 8  Creatinine 0.44 - 1.00 mg/dL  0.61  Sodium 135 - 145 mmol/L 138  Potassium 3.5 - 5.1 mmol/L 3.7  Chloride 98 - 111 mmol/L 104  CO2 22 - 32 mmol/L 25  Calcium 8.9 - 10.3 mg/dL 8.3(L)  Total Protein 6.5 - 8.1 g/dL -  Total Bilirubin 0.3 - 1.2 mg/dL -  Alkaline Phos 38 - 126 U/L -  AST 15 - 41 U/L -  ALT 0 - 44 U/L -    CBC Latest Ref Rng & Units 10/26/2018  WBC 4.0 - 10.5 K/uL 5.5  Hemoglobin 12.0 - 15.0 g/dL 13.5  Hematocrit 36.0 - 46.0 % 41.5  Platelets 150 - 400 K/uL 197   No images are attached to the encounter.  No results found.  Assessment and plan- Patient is a 53 y.o. female diagnosed with stage III left breast cancer status post neoadjuvant chemotherapy, lumpectomy, radiation currently on anastrozole and Zoladex, who presents to symptom management clinic for follow-up for depression and hallucinations.   1.  Depression and hallucinations-on Celexa and trazodone.  Stable to improved. Again encouraged patient to schedule appointment with Dr. Nicolasa Ducking and she can discuss financial concerns at that time. Again encouraged counseling with Janeann Merl in the interim. Continue celexa and trazodone.   2. She does not have a primary care provider. I offered ongoing management of above as possible alternative to going to psychiatry. I provided her with the phone numbers today to be referred to primary care and contact information regarding free and sliding scale clinics in the area in the interim.   Disposition:  RTC in 3 months for re-evaluation. Return to clinic if symptoms do not improve or worsen int he interim.   Visit Diagnosis 1. Current severe episode of major depressive disorder with psychotic features, unspecified whether recurrent ([place])     Patient expressed understanding and was in agreement with this plan. She also understands that She can call clinic at any time with any questions, concerns, or complaints.   Thank you for allowing me to participate in the care of this pleasant patient.   Beckey Rutter, DNP, AGNP-C [place] [facility] [phone] (clinic)  CC: Dr. Rogue Bussing

## 2018-11-30 NOTE — Patient Instructions (Addendum)
Please call the [facility] at [phone] to establish care with a Primary Care Provider.   In the meantime, you can access care through the following free and reduced cost health care locations in [place] ([address], [place], [place] [address]- [phone])  -[place] ([address], [place], [place] [address]- [phone])  -[facility] ([address] [place], [place] [address]- [phone])  -[place] ([address], [place] [phone])  -[facility] Department and [phone])   You need to call Dr. Waylan Boga office at [phone] for new patient appointment. Ask to speak to Valli Glance, or Anderson Malta.

## 2018-12-03 ENCOUNTER — Other Ambulatory Visit: Payer: Self-pay

## 2018-12-03 ENCOUNTER — Ambulatory Visit
Admission: RE | Admit: 2018-12-03 | Discharge: 2018-12-03 | Disposition: A | Discharge status: Home / Self Care | Payer: 59 | Source: Ambulatory Visit | Attending: Radiation Oncology | Admitting: Radiation Oncology

## 2018-12-03 ENCOUNTER — Inpatient Hospital Stay: Payer: 59

## 2018-12-03 ENCOUNTER — Encounter: Payer: Self-pay | Admitting: Radiation Oncology

## 2018-12-03 VITALS — BP 107/80 | HR 90 | Temp 97.8°F | Resp 18 | Wt 180.5 lb

## 2018-12-03 DIAGNOSIS — G629 Polyneuropathy, unspecified: Secondary | ICD-10-CM | POA: Insufficient documentation

## 2018-12-03 DIAGNOSIS — Z79811 Long term (current) use of aromatase inhibitors: Secondary | ICD-10-CM | POA: Insufficient documentation

## 2018-12-03 DIAGNOSIS — Z923 Personal history of irradiation: Secondary | ICD-10-CM | POA: Diagnosis not present

## 2018-12-03 DIAGNOSIS — Z170 Estrogen receptor positive status [ER+]: Secondary | ICD-10-CM | POA: Insufficient documentation

## 2018-12-03 DIAGNOSIS — C50512 Malignant neoplasm of lower-outer quadrant of left female breast: Secondary | ICD-10-CM

## 2018-12-03 DIAGNOSIS — Z95828 Presence of other vascular implants and grafts: Secondary | ICD-10-CM

## 2018-12-03 MED ORDER — HEPARIN SOD (PORK) LOCK FLUSH 100 UNIT/ML IV SOLN
500.0000 [IU] | Freq: Once | INTRAVENOUS | Status: AC
  Administered 2018-12-03: 500 [IU] via INTRAVENOUS

## 2018-12-03 MED ORDER — SODIUM CHLORIDE 0.9% FLUSH
10.0000 mL | Freq: Once | INTRAVENOUS | Status: AC
  Administered 2018-12-03: 10 mL via INTRAVENOUS
  Filled 2018-12-03: qty 10

## 2018-12-03 NOTE — Progress Notes (Signed)
Radiation Oncology Follow up Note  Name: Carolyn Roy   Date:   12/03/2018 MRN:  [id number] DOB: August 30, 1965    This 53 y.o. female presents to the clinic today for 39-month follow-up status post whole breast radiation to her left breast for stage IIIa invasive mammary carcinoma (T3 N1 M0).  REFERRING PROVIDER: No ref. provider found  HPI: Patient is a 53 year old female now out 6 months having completed whole breast radiation plus peripheral lymphatic radiation for stage IIIa invasive mammary carcinoma of the left breast.  She had neoadjuvant chemotherapy tumor is ER/PR positive.  She is seen today in routine follow-up is doing well.  She specifically denies breast tenderness cough or bone pain.  She is currently on anastrozole.  Tolerate that well without side effect.  She had mammograms back in July which were BI-RADS 2 benign which I have reviewed.  Patient is also currently on Zoladex.  She has had some peripheral neuropathy has had acupuncture and is on Neurontin  COMPLICATIONS OF TREATMENT: none  FOLLOW UP COMPLIANCE: keeps appointments   PHYSICAL EXAM:  BP 107/80   Pulse 90   Temp 97.8 F (36.6 C)   Resp 18   Wt 180 lb 8 oz (81.9 kg)   LMP  (LMP Unknown) Comment: LAST PERIOD IN MAY 2019 WHEN SHE STARTED CHEMO  BMI 30.98 kg/m  Lungs are clear to A&P cardiac examination essentially unremarkable with regular rate and rhythm. No dominant mass or nodularity is noted in either breast in 2 positions examined. Incision is well-healed. No axillary or supraclavicular adenopathy is appreciated. Cosmetic result is excellent.  Well-developed well-nourished patient in NAD. HEENT reveals PERLA, EOMI, discs not visualized.  Oral cavity is clear. No oral mucosal lesions are identified. Neck is clear without evidence of cervical or supraclavicular adenopathy. Lungs are clear to A&P. Cardiac examination is essentially unremarkable with regular rate and rhythm without murmur rub or thrill. Abdomen  is benign with no organomegaly or masses noted. Motor sensory and DTR levels are equal and symmetric in the upper and lower extremities. Cranial nerves II through XII are grossly intact. Proprioception is intact. No peripheral adenopathy or edema is identified. No motor or sensory levels are noted. Crude visual fields are within normal range.  RADIOLOGY RESULTS: Mammogram reviewed compatible with above-stated findings  PLAN: Present time she continues to do well with no evidence of disease 6 months out from whole breast radiation.  I am pleased with her overall progress.  I have asked to see her back in 1 year for follow-up.  She continues close follow-up care with medical oncology.  Patient knows to call with any concerns at any time.  I would like to take this opportunity to thank you for allowing me to participate in the care of your patient.Noreene Filbert, MD

## 2018-12-11 ENCOUNTER — Encounter: Payer: Self-pay | Admitting: Pain Medicine

## 2018-12-11 NOTE — Progress Notes (Signed)
Patient states she has been out of medication since 11/25/18.  Her last Rx was to fill on 09/17/18 to last until 10/17/18.  States she made the pills last and is now out.  I asked patient why she did not make a refill appt, she stated that she thought she had to wait until December.  She is aksing if Dr Dossie Arbour was going to write her oxycodone with a 3 month refill.  I explained to the patient that refills are not permitted that we typically give 3 different Rx's with appropriate fill dates.

## 2018-12-12 ENCOUNTER — Other Ambulatory Visit: Payer: Self-pay | Admitting: Pain Medicine

## 2018-12-12 ENCOUNTER — Other Ambulatory Visit: Payer: Self-pay

## 2018-12-12 ENCOUNTER — Ambulatory Visit: Payer: 59 | Attending: Pain Medicine | Admitting: Pain Medicine

## 2018-12-12 DIAGNOSIS — M792 Neuralgia and neuritis, unspecified: Secondary | ICD-10-CM

## 2018-12-12 DIAGNOSIS — M47816 Spondylosis without myelopathy or radiculopathy, lumbar region: Secondary | ICD-10-CM

## 2018-12-12 DIAGNOSIS — G620 Drug-induced polyneuropathy: Secondary | ICD-10-CM

## 2018-12-12 DIAGNOSIS — M25561 Pain in right knee: Secondary | ICD-10-CM | POA: Diagnosis not present

## 2018-12-12 DIAGNOSIS — G894 Chronic pain syndrome: Secondary | ICD-10-CM

## 2018-12-12 DIAGNOSIS — T451X5A Adverse effect of antineoplastic and immunosuppressive drugs, initial encounter: Secondary | ICD-10-CM

## 2018-12-12 DIAGNOSIS — G5793 Unspecified mononeuropathy of bilateral lower limbs: Secondary | ICD-10-CM

## 2018-12-12 DIAGNOSIS — M1711 Unilateral primary osteoarthritis, right knee: Secondary | ICD-10-CM

## 2018-12-12 DIAGNOSIS — M545 Low back pain, unspecified: Secondary | ICD-10-CM

## 2018-12-12 DIAGNOSIS — G8929 Other chronic pain: Secondary | ICD-10-CM | POA: Diagnosis not present

## 2018-12-12 MED ORDER — OXYCODONE HCL 5 MG PO TABS: 5.0000 mg | ORAL_TABLET | Freq: Two times a day (BID) | ORAL | 0 refills | Status: DC | PRN

## 2018-12-12 MED ORDER — GABAPENTIN 300 MG PO CAPS: 900.0000 mg | ORAL_CAPSULE | Freq: Four times a day (QID) | ORAL | 5 refills | Status: DC

## 2018-12-12 NOTE — Progress Notes (Signed)
Pain Management Virtual Encounter Note - Virtual Visit via Telephone Telehealth (real-time audio visits between healthcare provider and patient).   Patient's Phone No. & Preferred Pharmacy:  [phone] (home); [phone] (mobile); (Preferred) [phone] [email or web address]  [id number] - [place], [place] - [address] [address] Phone: [phone] Fax: [phone]    Pre-screening note:  Our staff contacted Carolyn Roy and offered her an "in person", "face-to-face" appointment versus a telephone encounter. She indicated preferring the telephone encounter, at this time.   Reason for Virtual Visit: COVID-19*  Social distancing based on CDC and AMA recommendations.   I contacted Carolyn Roy on 12/12/2018 via telephone.      I clearly identified myself as Gaspar Cola, MD. I verified that I was speaking with the correct person using two identifiers (Name: Carolyn Roy, and date of birth: 06/17/1965).  Advanced Informed Consent I sought verbal advanced consent from Carolyn Roy for virtual visit interactions. I informed Carolyn Roy of possible security and privacy concerns, risks, and limitations associated with providing "not-in-person" medical evaluation and management services. I also informed Carolyn Roy of the availability of "in-person" appointments. Finally, I informed her that there would be a charge for the virtual visit and that she could be  personally, fully or partially, financially responsible for it. Carolyn Roy expressed understanding and agreed to proceed.   Historic Elements   Carolyn Roy is a 53 y.o. year old, female patient evaluated today after her last encounter by our practice on 10/19/2018. Carolyn Roy  has a past medical history of Breast cancer ([place]), Cancer ([place]) (06/15/2017), Depression, Diabetes mellitus without complication ([place]) (0000000), Endometriosis, Family history of breast cancer, Headache, Hyperlipidemia,  Ovarian mass, Personal history of chemotherapy, Personal history of radiation therapy, and Pneumonia. She also  has a past surgical history that includes Oophorectomy; Tubal ligation; Portacath placement (Right, 07/14/2017); Axillary lymph node biopsy (Left, 07/14/2017); Breast lumpectomy (Left, 01/12/2018); Breast biopsy (Left); Partial mastectomy with needle localization (Left, 01/12/2018); and Sentinel node biopsy (Left, 01/12/2018). Carolyn Roy has a current medication list which includes the following prescription(s): albuterol, anastrozole, aspirin-acetaminophen-caffeine, fifty50 glucose meter 2.0, canagliflozin, vitamin d3, citalopram, gabapentin, glucose blood, goserelin, basaglar kwikpen, insulin pen needle, lidocaine-prilocaine, magnesium, ondansetron, oxycodone, oxycodone, oxycodone, prochlorperazine, and trazodone, and the following Facility-Administered Medications: heparin lock flush and sodium chloride flush. She  reports that she has been smoking cigarettes. She has a 5.50 pack-year smoking history. She has quit using smokeless tobacco.  Her smokeless tobacco use included snuff. She reports that she does not drink alcohol or use drugs. Carolyn Roy is allergic to aspirin.   HPI  Today, she is being contacted for medication management.  The patient indicates doing well with the current medication regimen. No adverse reactions or side effects reported to the medications.  The patient did extremely well with her first diagnostic lumbar facet block.  Today I will make arrangements for referral to physical therapy to see if we can help her with some PT.  Should this aggravate the pain or the patient gets no benefit, then we will do the second diagnostic.  Should she again get similar results to the initial diagnostic injection, but she continues to have recurrence of the pain, then we will consider other options such as radiofrequency ablation.  At this point, she is indicating that her worst pain is that of  her right knee.  We previously had done a right intra-articular knee joint injection with  local anesthetic and steroid which provided her with excellent relief of the pain for the duration of local anesthetic but no long-term benefit.  She still having recurrence of this pain and the MRI shows that she has tricompartmental osteoarthritis of the knee.  Because of this we will go ahead and schedule her to start a series of intra-articular Hyalgan knee injections.  Pharmacotherapy Assessment  Analgesic: Oxycodone IR 5 mg, 1 tab PO BID (10 mg/day of oxycodone) (Average: 2 tabs/day = 10 mg/day of oxycodone) MME/day: 15 mg/day.   Monitoring: Pharmacotherapy: No side-effects or adverse reactions reported. [place] PMP: PDMP reviewed during this encounter.       Compliance: No problems identified. Effectiveness: Clinically acceptable. Plan: Refer to "POC".  UDS:  Summary  Date Value Ref Range Status  01/22/2018 FINAL  Final    Comment:    ==================================================================== TOXASSURE COMP DRUG ANALYSIS,UR ==================================================================== Test                             Result       Flag       Units Drug Present and Declared for Prescription Verification   Norhydrocodone                 255          EXPECTED   ng/mg creat    Norhydrocodone is an expected metabolite of hydrocodone.   Tramadol                       5912         EXPECTED   ng/mg creat   O-Desmethyltramadol            7009         EXPECTED   ng/mg creat   N-Desmethyltramadol            1209         EXPECTED   ng/mg creat    Source of tramadol is a prescription medication.    O-desmethyltramadol and N-desmethyltramadol are expected    metabolites of tramadol.   Gabapentin                     PRESENT      EXPECTED   Acetaminophen                  PRESENT      EXPECTED Drug Present not Declared for Prescription Verification   Alcohol, Ethyl                 0.242         UNEXPECTED g/dL    Sources of ethyl alcohol include alcoholic beverages or as a    fermentation product of glucose; glucose is present in this    specimen.  Interpret result with caution, as the presence of    ethyl alcohol is likely due, at least in part, to fermentation of    glucose.   Naproxen                       PRESENT      UNEXPECTED Drug Absent but Declared for Prescription Verification   Hydrocodone                    Not Detected UNEXPECTED ng/mg creat    Hydrocodone is almost always present in patients taking this drug  consistently. Absence of hydrocodone could be due to lapse of    time since the last dose or unusual pharmacokinetics (rapid    metabolism).   Prochlorperazine               Not Detected UNEXPECTED   Salicylate                     Not Detected UNEXPECTED    Aspirin, as indicated in the declared medication list, is not    always detected even when used as directed.   Lidocaine                      Not Detected UNEXPECTED    Lidocaine, as indicated in the declared medication list, is not    always detected even when used as directed. ==================================================================== Test                      Result    Flag   Units      Ref Range   Creatinine              33               mg/dL      >=20 ==================================================================== Declared Medications:  The flagging and interpretation on this report are based on the  following declared medications.  Unexpected results may arise from  inaccuracies in the declared medications.  **Note: The testing scope of this panel includes these medications:  Gabapentin (Neurontin)  Hydrocodone (Norco)  Prochlorperazine (Compazine)  Tramadol (Ultram)  **Note: The testing scope of this panel does not include small to  moderate amounts of these reported medications:  Acetaminophen (Excedrin)  Acetaminophen (Norco)  Aspirin (Excedrin)  Lidocaine  (Lidocaine-Prilocaine)  **Note: The testing scope of this panel does not include following  reported medications:  Albuterol  Caffeine (Excedrin)  Canagliflozin (Invokana)  Insulin  Ondansetron (Zofran)  Prilocaine (Lidocaine-Prilocaine)  Vitamin D3 ==================================================================== For clinical consultation, please call [phone]. ====================================================================    Laboratory Chemistry Profile (12 mo)  Renal: 01/22/2018: BUN/Creatinine Ratio 16 07/18/2018: BUN 8; Creatinine, Ser 0.61  Lab Results  Component Value Date   GFRAA >60 07/18/2018   GFRNONAA >60 07/18/2018   Hepatic: 05/22/2018: Albumin 4.2 Lab Results  Component Value Date   AST 16 05/22/2018   ALT 18 05/22/2018   Other: 12/13/2017: Vit D, 25-Hydroxy 20.0; Vitamin B-12 637 01/22/2018: CRP 7; Sed Rate 27 Note: Above Lab results reviewed.  Imaging  Last 90 days:  No results found.  Assessment  The primary encounter diagnosis was Chronic pain of knee (Right). Diagnoses of Osteoarthritis of knee (Right), Lumbar facet syndrome (Bilateral) (L>R), Chronic low back pain (Bilateral (L>R) w/o sciatica, Chronic pain syndrome, Neuropathic pain of feet (Bilateral), Chemotherapy-induced neuropathy ([place]), and Neuropathic pain were also pertinent to this visit.  Plan of Care  I have changed Carolyn Roy oxyCODONE. I am also having her start on oxyCODONE and oxyCODONE. Additionally, I am having her maintain her glucose blood, Insulin Pen Needle, albuterol, Basaglar KwikPen, ondansetron, prochlorperazine, lidocaine-prilocaine, canagliflozin, aspirin-acetaminophen-caffeine, Fifty50 Glucose Meter 2.0, goserelin, Vitamin D3, Magnesium, citalopram, traZODone, anastrozole, and gabapentin.  Pharmacotherapy (Medications Ordered): Meds ordered this encounter  Medications  . gabapentin (NEURONTIN) 300 MG capsule    Sig: Take 3 capsules (900 mg total) by  mouth 4 (four) times daily.    Dispense:  360 capsule    Refill:  5    Fill  one day early if pharmacy is closed on scheduled refill date. May substitute for generic if available.  [name] oxyCODONE (OXY IR/ROXICODONE) 5 MG immediate release tablet    Sig: Take 1 tablet (5 mg total) by mouth 2 (two) times daily as needed for severe pain. Must last 30 days    Dispense:  60 tablet    Refill:  0    Chronic Pain: STOP Act (Not applicable) Fill 1 day early if closed on refill date. Do not fill until: 12/12/2018. To last until: 01/11/2019. Avoid benzodiazepines within 8 hours of opioids  . oxyCODONE (OXY IR/ROXICODONE) 5 MG immediate release tablet    Sig: Take 1 tablet (5 mg total) by mouth 2 (two) times daily as needed for severe pain. Must last 30 days    Dispense:  60 tablet    Refill:  0    Chronic Pain: STOP Act (Not applicable) Fill 1 day early if closed on refill date. Do not fill until: 01/11/2019. To last until: 02/10/2019. Avoid benzodiazepines within 8 hours of opioids  . oxyCODONE (OXY IR/ROXICODONE) 5 MG immediate release tablet    Sig: Take 1 tablet (5 mg total) by mouth 2 (two) times daily as needed for severe pain. Must last 30 days    Dispense:  60 tablet    Refill:  0    Chronic Pain: STOP Act (Not applicable) Fill 1 day early if closed on refill date. Do not fill until: 02/10/2019. To last until: 03/12/2019. Avoid benzodiazepines within 8 hours of opioids   Orders:  Orders Placed This Encounter  Procedures  . Hyalgan Knee Inj. (Schedule)    Hyalgan knee injection. Please order Hyalgan.    Standing Status:   Future    Standing Expiration Date:   01/11/2019    Scheduling Instructions:     Procedure: Intra-articular Hyalgan Knee injection #1     Side: Right-sided     Sedation: None     Timeframe: ASAP    Order Specific Question:   Where will this procedure be performed?    Answer:   [facility]  . AMB PT Referral (2-3x/wk, x 6wks)    Referral Priority:   Routine    Referral  Type:   Physical Medicine    Referral Reason:   Specialty Services Required    Requested Specialty:   Physical Therapy    Number of Visits Requested:   1   Follow-up plan:   Return in about 3 months (around 03/11/2019) for (VV), (MM), in addition, Procedure (no sedation): (R) Hyalgan #1, (ASAP).  The patient was referred today to physical therapy for the low back pain.     Considering:   Possible bilateral lumbar facet RFA  Diagnostic lumbar sympathetic block  Diagnostic right knee Hyalgan series #1  Diagnostic right genicular NB  Possible right genicular nerve RFA    Palliative PRN treatment(s):   Therapeutic right intra-articular knee joint injection #2 (steroids)  Diagnostic bilateral lumbar facet block #2     Recent Visits Date Type Provider Dept  09/26/18 Office Visit Milinda Pointer, MD [facility]  Showing recent visits within past 90 days and meeting all other requirements   Today's Visits Date Type Provider Dept  12/12/18 Telemedicine Milinda Pointer, MD [facility]  Showing today's visits and meeting all other requirements   Future Appointments Date Type Provider Dept  01/14/19 Appointment Milinda Pointer, MD [facility]  Showing future appointments within next 90 days and meeting all  other requirements   I discussed the assessment and treatment plan with the patient. The patient was provided an opportunity to ask questions and all were answered. The patient agreed with the plan and demonstrated an understanding of the instructions.  Patient advised to call back or seek an in-person evaluation if the symptoms or condition worsens.  Total duration of non-face-to-face encounter: 16 minutes.  Note by: Gaspar Cola, MD Date: 12/12/2018; Time: 3:37 PM  Note: This dictation was prepared with Dragon dictation. Any transcriptional errors that may result from this process are unintentional.  Disclaimer:  * Given the special  circumstances of the COVID-19 pandemic, the federal government has announced that the Office for Civil Rights (OCR) will exercise its enforcement discretion and will not impose penalties on physicians using telehealth in the event of noncompliance with regulatory requirements under the [place] (HIPAA) in connection with the good faith provision of telehealth during the XX123456 national public health emergency. ([place])

## 2018-12-20 ENCOUNTER — Other Ambulatory Visit: Payer: Self-pay

## 2018-12-20 ENCOUNTER — Ambulatory Visit: Payer: 59 | Attending: Pain Medicine | Admitting: Pain Medicine

## 2018-12-20 ENCOUNTER — Encounter: Payer: Self-pay | Admitting: Pain Medicine

## 2018-12-20 VITALS — BP 103/65 | HR 79 | Temp 98.0°F | Resp 16 | Ht 64.0 in | Wt 180.0 lb

## 2018-12-20 DIAGNOSIS — M25562 Pain in left knee: Secondary | ICD-10-CM | POA: Insufficient documentation

## 2018-12-20 DIAGNOSIS — M1711 Unilateral primary osteoarthritis, right knee: Secondary | ICD-10-CM | POA: Diagnosis present

## 2018-12-20 DIAGNOSIS — E559 Vitamin D deficiency, unspecified: Secondary | ICD-10-CM | POA: Diagnosis present

## 2018-12-20 DIAGNOSIS — M25561 Pain in right knee: Secondary | ICD-10-CM | POA: Insufficient documentation

## 2018-12-20 DIAGNOSIS — G8929 Other chronic pain: Secondary | ICD-10-CM | POA: Diagnosis not present

## 2018-12-20 MED ORDER — METHYLPREDNISOLONE ACETATE 80 MG/ML IJ SUSP
80.0000 mg | Freq: Once | INTRAMUSCULAR | Status: AC
  Administered 2018-12-20: 80 mg via INTRA_ARTICULAR
  Filled 2018-12-20: qty 1

## 2018-12-20 MED ORDER — ROPIVACAINE HCL 2 MG/ML IJ SOLN
3.0000 mL | Freq: Once | INTRAMUSCULAR | Status: AC
  Administered 2018-12-20: 3 mL via INTRA_ARTICULAR
  Filled 2018-12-20: qty 10

## 2018-12-20 MED ORDER — LIDOCAINE HCL 2 % IJ SOLN
5.0000 mL | Freq: Once | INTRAMUSCULAR | Status: AC
  Administered 2018-12-20: 400 mg
  Filled 2018-12-20: qty 20

## 2018-12-20 MED ORDER — MAGNESIUM 500 MG PO CAPS: 500.0000 mg | ORAL_CAPSULE | Freq: Two times a day (BID) | ORAL | 0 refills | Status: DC

## 2018-12-20 NOTE — Progress Notes (Signed)
Safety precautions to be maintained throughout the outpatient stay will include: orient to surroundings, keep bed in low position, maintain call bell within reach at all times, provide assistance with transfer out of bed and ambulation.  

## 2018-12-20 NOTE — Patient Instructions (Signed)

## 2018-12-20 NOTE — Progress Notes (Signed)
Patient's Name: Carolyn Roy  MRN: [id number]  Referring Provider: Milinda Pointer, MD  DOB: November 08, 1965  PCP: Patient, No Pcp Per  DOS: 12/20/2018  Note by: Gaspar Cola, MD  Service setting: Ambulatory outpatient  Specialty: Interventional Pain Management  Patient type: Established  Location: [facility] (AMB) Pain Management Facility  Visit type: Interventional Procedure   Primary Reason for Visit: Interventional Pain Management Treatment. CC: Knee Pain (right)  Procedure:          Anesthesia, Analgesia, Anxiolysis:  Type: Diagnostic Intra-Articular Local anesthetic and steroid Knee Injection #2  Region: Lateral infrapatellar Knee Region Level: Knee Joint Laterality: Right knee  Type: Local Anesthesia Indication(s): Analgesia         Local Anesthetic: Lidocaine 1-2% Route: Infiltration ([place]/IM) IV Access: Declined Sedation: Declined   Position: Sitting   Indications: 1. Chronic knee pain (Secondary Area of Pain) (Bilateral) (R>L)   2. Osteoarthritis of knee (Right)    Pain Score: Pre-procedure: 6 /10 Post-procedure: 0-No pain/10   Pre-op Assessment:  Carolyn Roy is a 53 y.o. (year old), female patient, seen today for interventional treatment. She  has a past surgical history that includes Oophorectomy; Tubal ligation; Portacath placement (Right, 07/14/2017); Axillary lymph node biopsy (Left, 07/14/2017); Breast lumpectomy (Left, 01/12/2018); Breast biopsy (Left); Partial mastectomy with needle localization (Left, 01/12/2018); and Sentinel node biopsy (Left, 01/12/2018). Carolyn Roy has a current medication list which includes the following prescription(s): albuterol, anastrozole, aspirin-acetaminophen-caffeine, fifty50 glucose meter 2.0, canagliflozin, vitamin d3, citalopram, gabapentin, glucose blood, goserelin, basaglar kwikpen, insulin pen needle, lidocaine-prilocaine, ondansetron, oxycodone, oxycodone, oxycodone, prochlorperazine, trazodone, and magnesium, and the following  Facility-Administered Medications: heparin lock flush and sodium chloride flush. Her primarily concern today is the Knee Pain (right)  Initial Vital Signs:  Pulse/HCG Rate: 80  Temp: 98 F (36.7 C) Resp: 18 BP: 105/76 SpO2: 95 %  BMI: Estimated body mass index is 30.9 kg/m as calculated from the following:   Height as of this encounter: 5\' 4"  (1.626 m).   Weight as of this encounter: 180 lb (81.6 kg).  Risk Assessment: Allergies: Reviewed. She is allergic to aspirin.  Allergy Precautions: None required Coagulopathies: Reviewed. None identified.  Blood-thinner therapy: None at this time Active Infection(s): Reviewed. None identified. Carolyn Roy is afebrile  Site Confirmation: Carolyn Roy was asked to confirm the procedure and laterality before marking the site Procedure checklist: Completed Consent: Before the procedure and under the influence of no sedative(s), amnesic(s), or anxiolytics, the patient was informed of the treatment options, risks and possible complications. To fulfill our ethical and legal obligations, as recommended by the American Medical Association's Code of Ethics, I have informed the patient of my clinical impression; the nature and purpose of the treatment or procedure; the risks, benefits, and possible complications of the intervention; the alternatives, including doing nothing; the risk(s) and benefit(s) of the alternative treatment(s) or procedure(s); and the risk(s) and benefit(s) of doing nothing. The patient was provided information about the general risks and possible complications associated with the procedure. These may include, but are not limited to: failure to achieve desired goals, infection, bleeding, organ or nerve damage, allergic reactions, paralysis, and death. In addition, the patient was informed of those risks and complications associated to the procedure, such as failure to decrease pain; infection; bleeding; organ or nerve damage with subsequent  damage to sensory, motor, and/or autonomic systems, resulting in permanent pain, numbness, and/or weakness of one or several areas of the body; allergic reactions; (i.e.: anaphylactic reaction); and/or death.  Furthermore, the patient was informed of those risks and complications associated with the medications. These include, but are not limited to: allergic reactions (i.e.: anaphylactic or anaphylactoid reaction(s)); adrenal axis suppression; blood sugar elevation that in diabetics may result in ketoacidosis or comma; water retention that in patients with history of congestive heart failure may result in shortness of breath, pulmonary edema, and decompensation with resultant heart failure; weight gain; swelling or edema; medication-induced neural toxicity; particulate matter embolism and blood vessel occlusion with resultant organ, and/or nervous system infarction; and/or aseptic necrosis of one or more joints. Finally, the patient was informed that Medicine is not an exact science; therefore, there is also the possibility of unforeseen or unpredictable risks and/or possible complications that may result in a catastrophic outcome. The patient indicated having understood very clearly. We have given the patient no guarantees and we have made no promises. Enough time was given to the patient to ask questions, all of which were answered to the patient's satisfaction. Carolyn Roy has indicated that she wanted to continue with the procedure. Attestation: I, the ordering provider, attest that I have discussed with the patient the benefits, risks, side-effects, alternatives, likelihood of achieving goals, and potential problems during recovery for the procedure that I have provided informed consent. Date  Time: 12/20/2018  2:03 PM  Pre-Procedure Preparation:  Monitoring: As per clinic protocol. Respiration, ETCO2, SpO2, BP, heart rate and rhythm monitor placed and checked for adequate function Safety Precautions:  Patient was assessed for positional comfort and pressure points before starting the procedure. Time-out: I initiated and conducted the "Time-out" before starting the procedure, as per protocol. The patient was asked to participate by confirming the accuracy of the "Time Out" information. Verification of the correct person, site, and procedure were performed and confirmed by me, the nursing staff, and the patient. "Time-out" conducted as per Joint Commission's Universal Protocol (UP.01.01.01). Time: 1416  Description of Procedure:          Target Area: Knee Joint Approach: Just above the Lateral tibial plateau, lateral to the infrapatellar tendon. Area Prepped: Entire knee area, from the mid-thigh to the mid-shin. Prepping solution: DuraPrep (Iodine Povacrylex [0.7% available iodine] and Isopropyl Alcohol, 74% w/w) Safety Precautions: Aspiration looking for blood return was conducted prior to all injections. At no point did we inject any substances, as a needle was being advanced. No attempts were made at seeking any paresthesias. Safe injection practices and needle disposal techniques used. Medications properly checked for expiration dates. SDV (single dose vial) medications used. Description of the Procedure: Protocol guidelines were followed. The patient was placed in position over the fluoroscopy table. The target area was identified and the area prepped in the usual manner. Skin & deeper tissues infiltrated with local anesthetic. Appropriate amount of time allowed to pass for local anesthetics to take effect. The procedure needles were then advanced to the target area. Proper needle placement secured. Negative aspiration confirmed. Solution injected in intermittent fashion, asking for systemic symptoms every 0.5cc of injectate. The needles were then removed and the area cleansed, making sure to leave some of the prepping solution back to take advantage of its long term bactericidal properties. Vitals:    12/20/18 1402 12/20/18 1421  BP: 105/76 103/65  Pulse: 80 79  Resp: 18 16  Temp:  98 F (36.7 C)  SpO2: 95% 97%  Weight: 180 lb (81.6 kg)   Height: 5\' 4"  (1.626 m)     Start Time: 1417 hrs. End Time: 1418 hrs. Materials:  Needle(s) Type: Regular needle Gauge: 25G Length: 1.5-in Medication(s): Please see orders for medications and dosing details.  Imaging Guidance:          Type of Imaging Technique: None used Indication(s): N/A Exposure Time: No patient exposure Contrast: None used. Fluoroscopic Guidance: N/A Ultrasound Guidance: N/A Interpretation: N/A  Antibiotic Prophylaxis:   Anti-infectives (From admission, onward)   None     Indication(s): None identified  Post-operative Assessment:  Post-procedure Vital Signs:  Pulse/HCG Rate: 79  Temp: 98 F (36.7 C) Resp: 16 BP: 103/65 SpO2: 97 %  EBL: None  Complications: No immediate post-treatment complications observed by team, or reported by patient.  Note: The patient tolerated the entire procedure well. A repeat set of vitals were taken after the procedure and the patient was kept under observation following institutional policy, for this type of procedure. Post-procedural neurological assessment was performed, showing return to baseline, prior to discharge. The patient was provided with post-procedure discharge instructions, including a section on how to identify potential problems. Should any problems arise concerning this procedure, the patient was given instructions to immediately contact us, at any time, without hesitation. In any case, we plan to contact the patient by telephone for a follow-up status report regarding this interventional procedure.  Comments:  No additional relevant information.  Plan of Care  Orders:  Orders Placed This Encounter  Procedures  . Steroid Knee Blk (Today)    Local Anesthetic & Steroid injection.    Scheduling Instructions:     Side(s): Right Knee     Sedation: None      Timeframe: Today    Order Specific Question:   Where will this procedure be performed?    Answer:   [facility]  . Consent: IA Knee inj. (Steroid) (R)    Provider Attestation: I, Valrico Dossie Arbour, MD, (Pain Management Specialist), the physician/practitioner, attest that I have discussed with the patient the benefits, risks, side effects, alternatives, likelihood of achieving goals and potential problems during recovery for the procedure that I have provided informed consent.    Scheduling Instructions:     Procedure: Right-sided intra-articular knee arthrocentesis (aspiration and/or injection)     Indications: Chronic right-sided knee pain secondary to knee arthropathy/arthralgia     Note: Always confirm laterality of pain with Ms. White, before procedure.     Transcribe to consent form and obtain patient signature.  . Block Tray    Equipment required: Single use, disposable, "Block Tray"    Standing Status:   Standing    Number of Occurrences:   1    Order Specific Question:   Specify    Answer:   Block Tray   Chronic Opioid Analgesic:  Oxycodone IR 5 mg, 1 tab PO BID (10 mg/day of oxycodone) (Average: 2 tabs/day = 10 mg/day of oxycodone) MME/day: 15 mg/day.   Medications ordered for procedure: Meds ordered this encounter  Medications  . lidocaine (XYLOCAINE) 2 % (with pres) injection 100 mg  . ropivacaine (PF) 2 mg/mL (0.2%) (NAROPIN) injection 3 mL  . methylPREDNISolone acetate (DEPO-MEDROL) injection 80 mg  . Magnesium 500 MG CAPS    Sig: Take 1 capsule (500 mg total) by mouth 2 (two) times daily at 8 am and 10 pm.    Dispense:  200 capsule    Refill:  0    Fill one day early if pharmacy is closed on scheduled refill date. May substitute for generic if available.   Medications administered: We administered lidocaine, ropivacaine (PF)  2 mg/mL (0.2%), and methylPREDNISolone acetate.  See the medical record for exact dosing, route, and time of  administration.  Follow-up plan:   Return in about 2 weeks (around 01/03/2019) for (VV), (MM).       Considering:   Possible bilateral lumbar facet RFA  Diagnostic lumbar sympathetic block  Diagnostic right knee Hyalgan series #1  Diagnostic right genicular NB  Possible right genicular nerve RFA    Palliative PRN treatment(s):   Therapeutic right intra-articular knee joint injection #2 (steroids)  Diagnostic bilateral lumbar facet block #2      Recent Visits Date Type Provider Dept  12/12/18 [place], [place], [facility]  09/26/18 Office Visit Milinda Pointer, MD [facility]  Showing recent visits within past 90 days and meeting all other requirements   Today's Visits Date Type Provider Dept  12/20/18 Procedure visit Milinda Pointer, MD [facility]  Showing today's visits and meeting all other requirements   Future Appointments Date Type Provider Dept  03/11/19 Appointment Milinda Pointer, MD [facility]  Showing future appointments within next 90 days and meeting all other requirements   Disposition: Discharge home  Discharge Date & Time: 12/20/2018; 1421 hrs.   Primary Care Physician: Patient, No Pcp Per Location: [facility] Note by: Gaspar Cola, MD Date: 12/20/2018; Time: 2:39 PM  Disclaimer:  Medicine is not an Chief Strategy Officer. The only guarantee in medicine is that nothing is guaranteed. It is important to note that the decision to proceed with this intervention was based on the information collected from the patient. The Data and conclusions were drawn from the patient's questionnaire, the interview, and the physical examination. Because the information was provided in large part by the patient, it cannot be guaranteed that it has not been purposely or unconsciously manipulated. Every effort has been made to obtain as much relevant data as possible for this evaluation.  It is important to note that the conclusions that lead to this procedure are derived in large part from the available data. Always take into account that the treatment will also be dependent on availability of resources and existing treatment guidelines, considered by other Pain Management Practitioners as being common knowledge and practice, at the time of the intervention. For Medico-Legal purposes, it is also important to point out that variation in procedural techniques and pharmacological choices are the acceptable norm. The indications, contraindications, technique, and results of the above procedure should only be interpreted and judged by a Board-Certified Interventional Pain Specialist with extensive familiarity and expertise in the same exact procedure and technique.

## 2018-12-21 ENCOUNTER — Telehealth: Payer: Self-pay

## 2018-12-21 DIAGNOSIS — F323 Major depressive disorder, single episode, severe with psychotic features: Secondary | ICD-10-CM | POA: Insufficient documentation

## 2018-12-21 DIAGNOSIS — F19982 Other psychoactive substance use, unspecified with psychoactive substance-induced sleep disorder: Secondary | ICD-10-CM | POA: Insufficient documentation

## 2018-12-21 NOTE — Telephone Encounter (Signed)
Post procedure phone call. Patient states she is doing good.  

## 2019-01-01 ENCOUNTER — Encounter: Payer: Self-pay | Admitting: Nurse Practitioner

## 2019-01-14 ENCOUNTER — Encounter: Payer: 59 | Admitting: Pain Medicine

## 2019-01-18 ENCOUNTER — Inpatient Hospital Stay: Payer: No Typology Code available for payment source

## 2019-01-18 ENCOUNTER — Inpatient Hospital Stay: Payer: No Typology Code available for payment source | Attending: Internal Medicine

## 2019-01-18 ENCOUNTER — Ambulatory Visit: Payer: No Typology Code available for payment source | Admitting: Surgery

## 2019-01-18 ENCOUNTER — Inpatient Hospital Stay (HOSPITAL_BASED_OUTPATIENT_CLINIC_OR_DEPARTMENT_OTHER): Payer: No Typology Code available for payment source | Admitting: Internal Medicine

## 2019-01-18 ENCOUNTER — Other Ambulatory Visit: Payer: Self-pay

## 2019-01-18 DIAGNOSIS — F419 Anxiety disorder, unspecified: Secondary | ICD-10-CM | POA: Diagnosis not present

## 2019-01-18 DIAGNOSIS — Z95828 Presence of other vascular implants and grafts: Secondary | ICD-10-CM

## 2019-01-18 DIAGNOSIS — F17210 Nicotine dependence, cigarettes, uncomplicated: Secondary | ICD-10-CM | POA: Insufficient documentation

## 2019-01-18 DIAGNOSIS — G8929 Other chronic pain: Secondary | ICD-10-CM | POA: Insufficient documentation

## 2019-01-18 DIAGNOSIS — Z170 Estrogen receptor positive status [ER+]: Secondary | ICD-10-CM

## 2019-01-18 DIAGNOSIS — Z5111 Encounter for antineoplastic chemotherapy: Secondary | ICD-10-CM | POA: Diagnosis present

## 2019-01-18 DIAGNOSIS — Z7984 Long term (current) use of oral hypoglycemic drugs: Secondary | ICD-10-CM | POA: Insufficient documentation

## 2019-01-18 DIAGNOSIS — C50812 Malignant neoplasm of overlapping sites of left female breast: Secondary | ICD-10-CM | POA: Diagnosis not present

## 2019-01-18 DIAGNOSIS — T451X5A Adverse effect of antineoplastic and immunosuppressive drugs, initial encounter: Secondary | ICD-10-CM | POA: Insufficient documentation

## 2019-01-18 DIAGNOSIS — M2550 Pain in unspecified joint: Secondary | ICD-10-CM | POA: Insufficient documentation

## 2019-01-18 DIAGNOSIS — R5383 Other fatigue: Secondary | ICD-10-CM | POA: Insufficient documentation

## 2019-01-18 DIAGNOSIS — Z886 Allergy status to analgesic agent status: Secondary | ICD-10-CM | POA: Insufficient documentation

## 2019-01-18 DIAGNOSIS — C50512 Malignant neoplasm of lower-outer quadrant of left female breast: Secondary | ICD-10-CM

## 2019-01-18 DIAGNOSIS — Z803 Family history of malignant neoplasm of breast: Secondary | ICD-10-CM | POA: Diagnosis not present

## 2019-01-18 DIAGNOSIS — E119 Type 2 diabetes mellitus without complications: Secondary | ICD-10-CM | POA: Insufficient documentation

## 2019-01-18 DIAGNOSIS — F329 Major depressive disorder, single episode, unspecified: Secondary | ICD-10-CM | POA: Diagnosis not present

## 2019-01-18 DIAGNOSIS — G620 Drug-induced polyneuropathy: Secondary | ICD-10-CM | POA: Insufficient documentation

## 2019-01-18 DIAGNOSIS — Z794 Long term (current) use of insulin: Secondary | ICD-10-CM | POA: Insufficient documentation

## 2019-01-18 DIAGNOSIS — Z90721 Acquired absence of ovaries, unilateral: Secondary | ICD-10-CM | POA: Insufficient documentation

## 2019-01-18 DIAGNOSIS — Z800 Family history of malignant neoplasm of digestive organs: Secondary | ICD-10-CM | POA: Insufficient documentation

## 2019-01-18 DIAGNOSIS — Z8379 Family history of other diseases of the digestive system: Secondary | ICD-10-CM | POA: Insufficient documentation

## 2019-01-18 DIAGNOSIS — Z79899 Other long term (current) drug therapy: Secondary | ICD-10-CM | POA: Diagnosis not present

## 2019-01-18 DIAGNOSIS — Z833 Family history of diabetes mellitus: Secondary | ICD-10-CM | POA: Insufficient documentation

## 2019-01-18 LAB — CBC WITH DIFFERENTIAL/PLATELET
Abs Immature Granulocytes: 0.02 10*3/uL (ref 0.00–0.07)
Basophils Absolute: 0 10*3/uL (ref 0.0–0.1)
Basophils Relative: 0 %
Eosinophils Absolute: 0.1 10*3/uL (ref 0.0–0.5)
Eosinophils Relative: 1 %
HCT: 43.4 % (ref 36.0–46.0)
Hemoglobin: 14.1 g/dL (ref 12.0–15.0)
Immature Granulocytes: 0 %
Lymphocytes Relative: 18 %
Lymphs Abs: 1.2 10*3/uL (ref 0.7–4.0)
MCH: 31.8 pg (ref 26.0–34.0)
MCHC: 32.5 g/dL (ref 30.0–36.0)
MCV: 97.7 fL (ref 80.0–100.0)
Monocytes Absolute: 0.3 10*3/uL (ref 0.1–1.0)
Monocytes Relative: 5 %
Neutro Abs: 5.2 10*3/uL (ref 1.7–7.7)
Neutrophils Relative %: 76 %
Platelets: 200 10*3/uL (ref 150–400)
RBC: 4.44 MIL/uL (ref 3.87–5.11)
RDW: 13 % (ref 11.5–15.5)
WBC: 6.8 10*3/uL (ref 4.0–10.5)
nRBC: 0 % (ref 0.0–0.2)

## 2019-01-18 LAB — COMPREHENSIVE METABOLIC PANEL
ALT: 18 U/L (ref 0–44)
AST: 23 U/L (ref 15–41)
Albumin: 3.7 g/dL (ref 3.5–5.0)
Alkaline Phosphatase: 103 U/L (ref 38–126)
Anion gap: 14 (ref 5–15)
BUN: 12 mg/dL (ref 6–20)
CO2: 24 mmol/L (ref 22–32)
Calcium: 8.9 mg/dL (ref 8.9–10.3)
Chloride: 99 mmol/L (ref 98–111)
Creatinine, Ser: 0.6 mg/dL (ref 0.44–1.00)
GFR calc Af Amer: >60 mL/min (ref >60)
GFR calc non Af Amer: >60 mL/min (ref >60)
Glucose, Bld: 464 mg/dL — ABNORMAL HIGH (ref 70–99)
Potassium: 3.5 mmol/L (ref 3.5–5.1)
Sodium: 137 mmol/L (ref 135–145)
Total Bilirubin: 0.4 mg/dL (ref 0.3–1.2)
Total Protein: 6.6 g/dL (ref 6.5–8.1)

## 2019-01-18 MED ORDER — SODIUM CHLORIDE 0.9 % IV SOLN
Freq: Once | INTRAVENOUS | Status: AC
  Administered 2019-01-18: 14:00:00 via INTRAVENOUS
  Filled 2019-01-18: qty 250

## 2019-01-18 MED ORDER — CITALOPRAM HYDROBROMIDE 20 MG PO TABS: 20.0000 mg | ORAL_TABLET | Freq: Every day | ORAL | 2 refills | Status: DC

## 2019-01-18 MED ORDER — HEPARIN SOD (PORK) LOCK FLUSH 100 UNIT/ML IV SOLN
500.0000 [IU] | Freq: Once | INTRAVENOUS | Status: AC
  Administered 2019-01-18: 500 [IU] via INTRAVENOUS
  Filled 2019-01-18: qty 5

## 2019-01-18 MED ORDER — SODIUM CHLORIDE 0.9% FLUSH
10.0000 mL | Freq: Once | INTRAVENOUS | Status: AC
  Administered 2019-01-18: 10 mL via INTRAVENOUS
  Filled 2019-01-18: qty 10

## 2019-01-18 MED ORDER — GOSERELIN ACETATE 10.8 MG ~~LOC~~ IMPL
10.8000 mg | DRUG_IMPLANT | Freq: Once | SUBCUTANEOUS | Status: AC
  Administered 2019-01-18: 10.8 mg via SUBCUTANEOUS
  Filled 2019-01-18: qty 10.8

## 2019-01-18 MED ORDER — ZOLEDRONIC ACID 4 MG/100ML IV SOLN
4.0000 mg | Freq: Once | INTRAVENOUS | Status: AC
  Administered 2019-01-18: 4 mg via INTRAVENOUS
  Filled 2019-01-18: qty 100

## 2019-01-18 MED ORDER — ZOLEDRONIC ACID 4 MG/5ML IV CONC
4.0000 mg | Freq: Once | INTRAVENOUS | Status: DC
  Filled 2019-01-18: qty 5

## 2019-01-18 MED ORDER — TRAZODONE HCL 50 MG PO TABS: 50.0000 mg | ORAL_TABLET | Freq: Every evening | ORAL | 2 refills | Status: DC | PRN

## 2019-01-18 NOTE — Assessment & Plan Note (Addendum)
#   breast cancer Stage III ER/PR pos her 2 NEG. currently on anastrazole+ Zoldadex.  No clinical evidence of recurrence.  Stable.  #Continue current therapy; tolerating fairly well except for continued joint pains.  Zometa q 6 months; zometa today.   # Depression/ Insomnia- stable; will refill; Awaiting to see Dr.kapur in jan 2021.   # Joint pains-overall stable.  Sec to AI-  on vit D/continue Osteo Bi-Flex/Neurontin.  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  Stable. STABLE.   # DM-2 on insulin-poorly controlled; not getting refills [previously [facility]; endo]; recommend dietary discretion; and also recommend call Dr.Crissman's office ASAP  # DISPOSITION: # Zoladex/ Zometa today.  # Follow up in 3 month;  MD;cbc/cmp/ca-27-29;Zoldadex Dr.B

## 2019-01-18 NOTE — Progress Notes (Signed)
Injection site WNL at discharge. No redness, pain or swelling noted at injection site.  Pt stable at discharge.

## 2019-01-18 NOTE — Progress Notes (Signed)
[facility] CONSULT NOTE  Patient Care Team: Patient, No Pcp Per as PCP - General (General Practice) [name], [name], MD (General Surgery) [name], [name], [name] (Obstetrics and Gynecology) Cammie Sickle, MD as Consulting Physician (Oncology)  CHIEF COMPLAINTS/PURPOSE OF CONSULTATION: Breast cancer  Oncology History Overview Note  # MAY 2019-  clinical stage IIIA (T3N1Mx) left breast cancer s/p biopsy on 06/14/2017. -Pathology revealed grade III invasive ductal carcinoma. -Axillary FNA revealed malignant cells c/w metastatic carcinoma. Tumor was ER + (90%), PR + (30%), Her2/neu - and Ki67 70%.  CA27.29 was 7.8 on 06/14/2017.  # She received 4 cycles of AC with Neulasta support (07/20/2017 - 08/31/2017).;  neoadjuvant Taxol on 09/14/2017.  #DEC 2019- Lumpectomy/sentinel lymph node biopsy [Dr.[name]]-complete pathologic response  # s/p RT [delayed sec to wound infection; Dr.[name]] finished RT [4/12]  # April 14th 2020- START TAM; stopped in mid-May secondary intolerance [severe migraines].  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 Secord/Berchuck]-negative for malignancy/ # DM-2- poorly controlled.   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   # PAP SMEAR- RECOMMENDED 2022- summer  -------------------------------------------  DIAGNOSIS: left breast cancer  STAGE:  III       ;GOALS: cure  CURRENT/MOST RECENT THERAPY Tam    Cancer of midline of breast, left (HCC)  06/15/2017 Initial Diagnosis   Cancer of midline of breast, left ([place])   06/26/2017 -  Chemotherapy   The patient had DOXOrubicin (ADRIAMYCIN) chemo injection 122 mg, 60 mg/m2 = 122 mg, Intravenous,  Once, 4 of 4 cycles Administration: 122 mg (07/20/2017), 122  mg (08/03/2017), 122 mg (08/17/2017), 122 mg (08/31/2017) palonosetron (ALOXI) injection 0.25 mg, 0.25 mg, Intravenous,  Once, 4 of 4 cycles Administration: 0.25 mg (07/20/2017), 0.25 mg (08/03/2017), 0.25 mg (08/17/2017), 0.25 mg (08/31/2017) pegfilgrastim (NEULASTA) injection 6 mg, 6 mg, Subcutaneous, Once, 5 of 5 cycles Administration: 6 mg (07/21/2017), 6 mg (08/04/2017), 6 mg (08/18/2017), 6 mg (09/01/2017) cyclophosphamide (CYTOXAN) 1,220 mg in sodium chloride 0.9 % 250 mL chemo infusion, 600 mg/m2 = 1,220 mg, Intravenous,  Once, 4 of 4 cycles Administration: 1,220 mg (07/20/2017), 1,220 mg (08/03/2017), 1,220 mg (08/17/2017), 1,220 mg (08/31/2017) PACLitaxel (TAXOL) 162 mg in sodium chloride 0.9 % 250 mL chemo infusion (</= 48m/m2), 80 mg/m2 = 162 mg, Intravenous,  Once, 10 of 12 cycles Dose modification: 65 mg/m2 (original dose 80 mg/m2, Cycle 13, Reason: Provider Judgment, Comment: neuropathy) Administration: 162 mg (09/14/2017), 162 mg (09/21/2017), 162 mg (09/28/2017), 162 mg (10/05/2017), 162 mg (10/12/2017), 162 mg (10/19/2017), 162 mg (10/26/2017), 132 mg (11/02/2017), 132 mg (11/09/2017), 132 mg (11/16/2017) fosaprepitant (EMEND) 150 mg, dexamethasone (DECADRON) 12 mg in sodium chloride 0.9 % 145 mL IVPB, , Intravenous,  Once, 4 of 4 cycles Administration:  (07/20/2017),  (08/03/2017),  (08/17/2017),  (08/31/2017)  for chemotherapy treatment.    Malignant neoplasm of lower-outer quadrant of left breast of female, estrogen receptor positive ([place]  11/08/2017 Initial Diagnosis   Malignant neoplasm of lower-outer quadrant of left breast of female, estrogen receptor positive ([place]    HISTORY OF PRESENTING ILLNESS:  Carolyn Roy 53y.o.  female with a history of stage III ER PR positive HER-2/neu negative breast cancer currently on Arimidex plus Zoladex is here for follow-up.  Patient notes to improvement of her symptoms of depression  and anxiety.  However she recently ran out of her medication.  She is  awaiting follow-up with psychiatry next month.  Chronic joint pains.  Follows up with pain clinic.  No fevers or chills.  No new lumps or bumps.  She admits to not taking her insulin/as she ran out medications.  Not followed up with endocrinology/PCP.  Admits to eating pasta/sweet tea.  Review of Systems  Constitutional: Positive for malaise/fatigue. Negative for chills, diaphoresis, fever and weight loss.  HENT: Negative for nosebleeds and sore throat.   Eyes: Negative for double vision.  Respiratory: Negative for cough, hemoptysis, sputum production, shortness of breath and wheezing.   Cardiovascular: Negative for chest pain, palpitations, orthopnea and leg swelling.  Gastrointestinal: Negative for abdominal pain, blood in stool, constipation, diarrhea, heartburn, melena, nausea and vomiting.  Genitourinary: Negative for dysuria, frequency and urgency.  Musculoskeletal: Positive for joint pain. Negative for back pain.  Skin: Negative.  Negative for itching and rash.  Neurological: Positive for tingling. Negative for dizziness, focal weakness, weakness and headaches.  Endo/Heme/Allergies: Does not bruise/bleed easily.  Psychiatric/Behavioral: Negative for depression. The patient is not nervous/anxious and does not have insomnia.      MEDICAL HISTORY:  Past Medical History:  Diagnosis Date  . Breast cancer ([place])   . Cancer ([place]) 06/15/2017   5.1 cm, T3,N1 (clinical): ER/ PR positive, Her 2 neu not overexpressed, High Ki 67. Neuoadjuvant chemotherapy.   . Depression   . Diabetes mellitus without complication ([place]) 9485  . Endometriosis   . Family history of breast cancer   . Headache    migraines  . Hyperlipidemia   . Ovarian mass   . Personal history of chemotherapy   . Personal history of radiation therapy   . Pneumonia    2018    SURGICAL HISTORY: Past Surgical History:  Procedure Laterality Date  . AXILLARY LYMPH NODE BIOPSY Left 07/14/2017   Procedure: INSERTION GEL  MARK CLIP LEFT AXILLA;  Surgeon: Robert Bellow, MD;  Location: [facility] ORS;  Service: General;  Laterality: Left;  . BREAST BIOPSY Left    Dr Orlene Och BREAST METASTATIC CARCINOMA  . BREAST LUMPECTOMY Left 01/12/2018  . OOPHORECTOMY    . PARTIAL MASTECTOMY WITH NEEDLE LOCALIZATION Left 01/12/2018   Procedure: PARTIAL MASTECTOMY WITH NEEDLE LOCALIZATION;  Surgeon: Robert Bellow, MD;  Location: [facility] ORS;  Service: General;  Laterality: Left;  . PORTACATH PLACEMENT Right 07/14/2017   Procedure: INSERTION PORT-A-CATH;  Surgeon: Robert Bellow, MD;  Location: [facility] ORS;  Service: General;  Laterality: Right;  . SENTINEL NODE BIOPSY Left 01/12/2018   Procedure: SENTINEL NODE BIOPSY;  Surgeon: Robert Bellow, MD;  Location: [facility] ORS;  Service: General;  Laterality: Left;  . TUBAL LIGATION      SOCIAL HISTORY: Social History   Socioeconomic History  . Marital status: Single    Spouse name: Not on file  . Number of children: Not on file  . Years of education: Not on file  . Highest education level: Not on file  Occupational History  . Not on file  Tobacco Use  . Smoking status: Current Every Day Smoker    Packs/day: 0.50    Years: 11.00    Pack years: 5.50    Types: Cigarettes  . Smokeless tobacco: Former Systems developer    Types: Snuff  Substance and Sexual Activity  . Alcohol use: No    Alcohol/week: 0.0 standard drinks  . Drug use: No  . Sexual activity: Yes  Other  Topics Concern  . Not on file  Social History Narrative  . Not on file   Social Determinants of Health   Financial Resource Strain:   . Difficulty of Paying Living Expenses: Not on file  Food Insecurity:   . Worried About Charity fundraiser in the Last Year: Not on file  . Ran Out of Food in the Last Year: Not on file  Transportation Needs:   . Lack of Transportation (Medical): Not on file  . Lack of Transportation (Non-Medical): Not on file  Physical Activity:   . Days of Exercise per Week: Not on file   . Minutes of Exercise per Session: Not on file  Stress:   . Feeling of Stress : Not on file  Social Connections:   . Frequency of Communication with Friends and Family: Not on file  . Frequency of Social Gatherings with Friends and Family: Not on file  . Attends Religious Services: Not on file  . Active Member of Clubs or Organizations: Not on file  . Attends Archivist Meetings: Not on file  . Marital Status: Not on file  Intimate Partner Violence:   . Fear of Current or Ex-Partner: Not on file  . Emotionally Abused: Not on file  . Physically Abused: Not on file  . Sexually Abused: Not on file    FAMILY HISTORY: Family History  Problem Relation Age of Onset  . Other Father        No info about father or paternal relatives  . Diabetes Brother   . Pancreatitis Brother   . Prostate cancer Brother 110       currently 40 / maternal half-brother  . Breast cancer Maternal Grandmother 40       deceased 90s  . Breast cancer Maternal Aunt 65       currently 74  . Breast cancer Other 74       mother's sister; deceased 32  . Breast cancer Other        mother's sister; age at dx unknown    ALLERGIES:  is allergic to aspirin.  MEDICATIONS:  Current Outpatient Medications  Medication Sig Dispense Refill  . citalopram (CELEXA) 20 MG tablet Take 1 tablet (20 mg total) by mouth daily. Take in AM 30 tablet 2  . albuterol (PROVENTIL HFA;VENTOLIN HFA) 108 (90 Base) MCG/ACT inhaler Inhale 2 puffs into the lungs every 6 (six) hours as needed for wheezing or shortness of breath. 1 Inhaler 0  . anastrozole (ARIMIDEX) 1 MG tablet Take 1 tablet (1 mg total) by mouth daily. 90 tablet 3  . aspirin-acetaminophen-caffeine (EXCEDRIN MIGRAINE) 250-250-65 MG tablet Take 2 tablets by mouth daily as needed for headache.    . Blood Glucose Monitoring Suppl (FIFTY50 GLUCOSE METER 2.0) w/Device KIT Use as directed    . canagliflozin (INVOKANA) 300 MG TABS tablet Take 300 mg by mouth daily.    .  Cholecalciferol (VITAMIN D3) 125 MCG (5000 UT) CAPS Take 1 capsule (5,000 Units total) by mouth daily with breakfast. Take along with calcium and magnesium. 100 capsule 0  . gabapentin (NEURONTIN) 300 MG capsule Take 3 capsules (900 mg total) by mouth 4 (four) times daily. (Patient not taking: Reported on 01/18/2019) 360 capsule 5  . glucose blood (ONE TOUCH ULTRA TEST) test strip Use up to 4 times/day 100 each 12  . goserelin (ZOLADEX) 3.6 MG injection Inject 3.6 mg into the skin every 28 (twenty-eight) days.    . Insulin Glargine [facility])  100 UNIT/ML SOPN INJECT 0.24 MLS (24 UNITS TOTAL) INTO THE SKIN AT BEDTIME. (Patient taking differently: Inject 50 Units into the skin at bedtime. ) 15 pen 2  . Insulin Pen Needle 32G X 4 MM MISC 1 Units by Does not apply route every morning. Pen needles 90 each 3  . lidocaine-prilocaine (EMLA) cream Apply to affected area once 30 g 3  . Magnesium 500 MG CAPS Take 1 capsule (500 mg total) by mouth 2 (two) times daily at 8 am and 10 pm. 200 capsule 0  . ondansetron (ZOFRAN) 8 MG tablet Take 1 tablet (8 mg total) by mouth 2 (two) times daily as needed. Start on the third day after chemotherapy. 30 tablet 1  . oxyCODONE (OXY IR/ROXICODONE) 5 MG immediate release tablet Take 1 tablet (5 mg total) by mouth 2 (two) times daily as needed for severe pain. Must last 30 days 60 tablet 0  . oxyCODONE (OXY IR/ROXICODONE) 5 MG immediate release tablet Take 1 tablet (5 mg total) by mouth 2 (two) times daily as needed for severe pain. Must last 30 days 60 tablet 0  . [START ON 02/10/2019] oxyCODONE (OXY IR/ROXICODONE) 5 MG immediate release tablet Take 1 tablet (5 mg total) by mouth 2 (two) times daily as needed for severe pain. Must last 30 days 60 tablet 0  . prochlorperazine (COMPAZINE) 10 MG tablet Take 1 tablet (10 mg total) by mouth every 6 (six) hours as needed (Nausea or vomiting). 30 tablet 1  . traZODone (DESYREL) 50 MG tablet Take 1-2 tablets (50-100 mg total) by  mouth at bedtime as needed for sleep. 60 tablet 2   No current facility-administered medications for this visit.   Facility-Administered Medications Ordered in Other Visits  Medication Dose Route Frequency Provider Last Rate Last Admin  . goserelin (ZOLADEX) injection 10.8 mg  10.8 mg Subcutaneous Once Charlaine Dalton R, MD      . heparin lock flush 100 unit/mL  500 Units Intravenous Once Corcoran, Melissa C, MD      . heparin lock flush 100 unit/mL  500 Units Intravenous Once Charlaine Dalton R, MD      . sodium chloride flush (NS) 0.9 % injection 10 mL  10 mL Intravenous Once Lequita Asal, MD      . Zoledronic Acid (ZOMETA) IVPB 4 mg  4 mg Intravenous Once Cammie Sickle, MD          .  PHYSICAL EXAMINATION: ECOG PERFORMANCE STATUS: 0 - Asymptomatic  Vitals:   01/18/19 1322  BP: 113/73  Pulse: (!) 102  Resp: 16  Temp: 98.3 F (36.8 C)  SpO2: 100%   Filed Weights   01/18/19 1322  Weight: 180 lb 3.2 oz (81.7 kg)    Physical Exam  Constitutional: She is oriented to person, place, and time and well-developed, well-nourished, and in no distress.  HENT:  Head: Normocephalic and atraumatic.  Mouth/Throat: Oropharynx is clear and moist. No oropharyngeal exudate.  Eyes: Pupils are equal, round, and reactive to light.  Cardiovascular: Normal rate and regular rhythm.  Pulmonary/Chest: No respiratory distress. She has no wheezes.  Abdominal: Soft. Bowel sounds are normal. She exhibits no distension and no mass. There is no abdominal tenderness. There is no rebound and no guarding.  Musculoskeletal:        General: No tenderness or edema. Normal range of motion.     Cervical back: Normal range of motion and neck supple.  Neurological: She is alert and oriented to person,  place, and time.  Skin: Skin is warm.  Psychiatric: Affect normal.     LABORATORY DATA:  I have reviewed the data as listed Lab Results  Component Value Date   WBC 6.8 01/18/2019    HGB 14.1 01/18/2019   HCT 43.4 01/18/2019   MCV 97.7 01/18/2019   PLT 200 01/18/2019   Recent Labs    02/02/18 0003 05/22/18 1146 07/18/18 1113 01/18/19 1312  NA 135 139 138 137  K 3.8 3.7 3.7 3.5  CL 102 104 104 99  CO2 '25 26 25 24  ' GLUCOSE 337* 194* 312* 464*  BUN '9 12 8 12  ' CREATININE 0.46 0.56 0.61 0.60  CALCIUM 9.1 9.5 8.3* 8.9  GFRNONAA >60 >60 >60 >60  GFRAA >60 >60 >60 >60  PROT 7.2 7.7  --  6.6  ALBUMIN 3.9 4.2  --  3.7  AST 15 16  --  23  ALT 14 18  --  18  ALKPHOS 89 94  --  103  BILITOT 0.5 0.6  --  0.4    RADIOGRAPHIC STUDIES: I have personally reviewed the radiological images as listed and agreed with the findings in the report. No results found.  ASSESSMENT & PLAN:   Malignant neoplasm of lower-outer quadrant of left breast of female, estrogen receptor positive ([place]) # breast cancer Stage III ER/PR pos her 2 NEG. currently on anastrazole+ Zoldadex.  No clinical evidence of recurrence.  Stable.  #Continue current therapy; tolerating fairly well except for continued joint pains.  Zometa q 6 months; zometa today.   # Depression/ Insomnia- stable; will refill; Awaiting to see Dr.kapur in jan 2021.   # Joint pains-overall stable.  Sec to AI-  on vit D/continue Osteo Bi-Flex/Neurontin.  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  Stable. STABLE.   # DM-2 on insulin-poorly controlled; not getting refills [previously [facility]; endo]; recommend dietary discretion; and also recommend call Dr.Crissman's office ASAP  # DISPOSITION: # Zoladex/ Zometa today.  # Follow up in 3 month;  MD;cbc/cmp/ca-27-29;Zoldadex Dr.B  All questions were answered. The patient knows to call the clinic with any problems, questions or concerns.    Cammie Sickle, MD 01/18/2019 2:38 PM

## 2019-01-19 LAB — CANCER ANTIGEN 27.29: CA 27.29: 10.1 U/mL (ref 0.0–38.6)

## 2019-01-22 ENCOUNTER — Other Ambulatory Visit: Payer: Self-pay

## 2019-01-22 ENCOUNTER — Encounter: Payer: Self-pay | Admitting: Surgery

## 2019-01-22 ENCOUNTER — Ambulatory Visit (INDEPENDENT_AMBULATORY_CARE_PROVIDER_SITE_OTHER): Payer: No Typology Code available for payment source | Admitting: Surgery

## 2019-01-22 VITALS — BP 101/69 | HR 84 | Temp 97.5°F | Resp 14 | Ht 65.0 in | Wt 179.4 lb

## 2019-01-22 DIAGNOSIS — Z170 Estrogen receptor positive status [ER+]: Secondary | ICD-10-CM

## 2019-01-22 DIAGNOSIS — C50512 Malignant neoplasm of lower-outer quadrant of left female breast: Secondary | ICD-10-CM | POA: Diagnosis not present

## 2019-01-22 NOTE — Progress Notes (Signed)
01/22/2019  History of Present Illness: Carolyn Roy is a 53 y.o. female s/p left breast lumpectomy and SLNBx on 01/12/2018.  She had neoadjuvant chemotherapy prior to surgery and then adjuvant radiation.  She was last seen on 09/14/18 and her last mammogram was on 09/07/18.  She reports that she's been doing well.  Reports only some intermittent issues with pain in the lateral aspect of the left breast.  Sometimes the pain is severe enough that she stops what she's doing.  Past Medical History: Past Medical History:  Diagnosis Date  . Breast cancer ([place])   . Cancer ([place]) 06/15/2017   5.1 cm, T3,N1 (clinical): ER/ PR positive, Her 2 neu not overexpressed, High Ki 67. Neuoadjuvant chemotherapy.   . Depression   . Diabetes mellitus without complication ([place]) 5697  . Endometriosis   . Family history of breast cancer   . Headache    migraines  . Hyperlipidemia   . Ovarian mass   . Personal history of chemotherapy   . Personal history of radiation therapy   . Pneumonia    2018     Past Surgical History: Past Surgical History:  Procedure Laterality Date  . AXILLARY LYMPH NODE BIOPSY Left 07/14/2017   Procedure: INSERTION GEL MARK CLIP LEFT AXILLA;  Surgeon: Robert Bellow, MD;  Location: [facility] ORS;  Service: General;  Laterality: Left;  . BREAST BIOPSY Left    Dr Orlene Och BREAST METASTATIC CARCINOMA  . BREAST LUMPECTOMY Left 01/12/2018  . OOPHORECTOMY    . PARTIAL MASTECTOMY WITH NEEDLE LOCALIZATION Left 01/12/2018   Procedure: PARTIAL MASTECTOMY WITH NEEDLE LOCALIZATION;  Surgeon: Robert Bellow, MD;  Location: [facility] ORS;  Service: General;  Laterality: Left;  . PORTACATH PLACEMENT Right 07/14/2017   Procedure: INSERTION PORT-A-CATH;  Surgeon: Robert Bellow, MD;  Location: [facility] ORS;  Service: General;  Laterality: Right;  . SENTINEL NODE BIOPSY Left 01/12/2018   Procedure: SENTINEL NODE BIOPSY;  Surgeon: Robert Bellow, MD;  Location: [facility] ORS;  Service: General;   Laterality: Left;  . TUBAL LIGATION      Home Medications: Prior to Admission medications   Medication Sig Start Date End Date Taking? Authorizing Provider  albuterol (PROVENTIL HFA;VENTOLIN HFA) 108 (90 Base) MCG/ACT inhaler Inhale 2 puffs into the lungs every 6 (six) hours as needed for wheezing or shortness of breath. 03/31/17  Yes Kathrine Haddock, NP  anastrozole (ARIMIDEX) 1 MG tablet Take 1 tablet (1 mg total) by mouth daily. 11/02/18  Yes Verlon Au, NP  aspirin-acetaminophen-caffeine (EXCEDRIN MIGRAINE) [id number] MG tablet Take 2 tablets by mouth daily as needed for headache.   Yes [provider]  Blood Glucose Monitoring Suppl (FIFTY50 GLUCOSE METER 2.0) w/Device KIT Use as directed 01/23/18 01/23/19 Yes [provider]  canagliflozin (INVOKANA) 300 MG TABS tablet Take 300 mg by mouth daily.   Yes [provider]  citalopram (CELEXA) 20 MG tablet Take 1 tablet (20 mg total) by mouth daily. Take in AM 01/18/19  Yes Cammie Sickle, MD  gabapentin (NEURONTIN) 300 MG capsule Take 3 capsules (900 mg total) by mouth 4 (four) times daily. 12/12/18 06/10/19 Yes Milinda Pointer, MD  glucose blood (ONE TOUCH ULTRA TEST) test strip Use up to 4 times/day 06/01/15  Yes Kathrine Haddock, NP  goserelin (ZOLADEX) 3.6 MG injection Inject 3.6 mg into the skin every 28 (twenty-eight) days.   Yes [provider]  Insulin Glargine (BASAGLAR KWIKPEN) 100 UNIT/ML SOPN INJECT 0.24 MLS (24 UNITS TOTAL) INTO THE  SKIN AT BEDTIME. Patient taking differently: Inject 50 Units into the skin at bedtime.  06/23/17  Yes Kathrine Haddock, NP  Insulin Pen Needle 32G X 4 MM MISC 1 Units by Does not apply route every morning. Pen needles 03/03/17  Yes Kathrine Haddock, NP  lidocaine-prilocaine (EMLA) cream Apply to affected area once 09/21/17  Yes Karen Kitchens, NP  ondansetron (ZOFRAN) 8 MG tablet Take 1 tablet (8 mg total) by mouth 2 (two) times daily as needed. Start on the third day  after chemotherapy. 07/04/17  Yes Lequita Asal, MD  prochlorperazine (COMPAZINE) 10 MG tablet Take 1 tablet (10 mg total) by mouth every 6 (six) hours as needed (Nausea or vomiting). 07/04/17  Yes Lequita Asal, MD  traZODone (DESYREL) 50 MG tablet Take 1-2 tablets (50-100 mg total) by mouth at bedtime as needed for sleep. 01/18/19  Yes Cammie Sickle, MD  oxyCODONE (OXY IR/ROXICODONE) 5 MG immediate release tablet Take 1 tablet (5 mg total) by mouth 2 (two) times daily as needed for severe pain. Must last 30 days 12/12/18 01/11/19  Milinda Pointer, MD    Allergies: Allergies  Allergen Reactions  . Aspirin Nausea And Vomiting    Review of Systems: Review of Systems  Constitutional: Negative for chills and fever.  Respiratory: Negative for shortness of breath.   Cardiovascular: Negative for chest pain.  Gastrointestinal: Negative for abdominal pain, nausea and vomiting.    Physical Exam BP 101/69   Pulse 84   Temp (!) 97.5 F (36.4 C) (Temporal)   Resp 14   Ht '5\' 5"'  (1.651 m)   Wt 179 lb 6.4 oz (81.4 kg)   LMP  (LMP Unknown) Comment: LAST PERIOD IN MAY 2019 WHEN SHE STARTED CHEMO  SpO2 95%   BMI 29.85 kg/m  CONSTITUTIONAL: No acute distress HEENT:  Normocephalic, atraumatic, extraocular motion intact. RESPIRATORY:  Lungs are clear, and breath sounds are equal bilaterally. Normal respiratory effort without pathologic use of accessory muscles. CARDIOVASCULAR: Heart is regular without murmurs, gallops, or rubs. BREAST:  Left breast s/p lower outer lumpectomy.  No palpable masses.  Patient has stable radiation skin changes.  No other skin changes or nipple changes.  Left axilla with SLNBx scar well healed.  No left axillary or supraclavicular lymphadenopathy.  Right breast without any palpable masses, skin changes, or nipple changes.  Has port-a-cath in right upper chest.  No right axillary or supraclavicular lymphadenopathy. NEUROLOGIC:  Motor and sensation is  grossly normal.  Cranial nerves are grossly intact. PSYCH:  Alert and oriented to person, place and time. Affect is normal.  Labs/Imaging: None recently  Assessment and Plan: This is a 53 y.o. female s/p left lumpectomy and SLNBx 01/12/18  --Discussed with the patient that her pain symptoms are related to scarring and radiation changes.  These may improve with time.  She could also talk to her pain clinic doctor about chronic pain issues related to this. --Patient is wondering about her port and if it can be removed.  I asked the patient to check with Oncology team to see if the port is still needed and when it would be safe to remove from their standpoint.  If ok to remove, discussed with the patient that she can call our office and schedule a procedure day at her convenience. --Her last mammogram was 08/2018.  She will follow up again in July 2021 with new mammogram.  Face-to-face time spent with the patient and care providers was 15 minutes, with more  than 50% of the time spent counseling, educating, and coordinating care of the patient.     Melvyn Neth, [facility]

## 2019-01-22 NOTE — Patient Instructions (Addendum)
Alternate heat and ice packs for the discomfort of the left breast. We will call you in June 2021 to schedule your 1 year Bilateral diagnostic mammogram for July 2021. Please call if you have any questions or concerns.    Breast Self-Awareness Breast self-awareness is knowing how your breasts look and feel. Doing breast self-awareness is important. It allows you to catch a breast problem early while it is still small and can be treated. All women should do breast self-awareness, including women who have had breast implants. Tell your doctor if you notice a change in your breasts. What you need:  A mirror.  A well-lit room. How to do a breast self-exam A breast self-exam is one way to learn what is normal for your breasts and to check for changes. To do a breast self-exam: Look for changes  1. Take off all the clothes above your waist. 2. Stand in front of a mirror in a room with good lighting. 3. Put your hands on your hips. 4. Push your hands down. 5. Look at your breasts and nipples in the mirror to see if one breast or nipple looks different from the other. Check to see if: ? The shape of one breast is different. ? The size of one breast is different. ? There are wrinkles, dips, and bumps in one breast and not the other. 6. Look at each breast for changes in the skin, such as: ? Redness. ? Scaly areas. 7. Look for changes in your nipples, such as: ? Liquid around the nipples. ? Bleeding. ? Dimpling. ? Redness. ? A change in where the nipples are. Feel for changes  1. Lie on your back on the floor. 2. Feel each breast. To do this, follow these steps: ? Pick a breast to feel. ? Put the arm closest to that breast above your head. ? Use your other arm to feel the nipple area of your breast. Feel the area with the pads of your three middle fingers by making small circles with your fingers. For the first circle, press lightly. For the second circle, press harder. For the third  circle, press even harder. ? Keep making circles with your fingers at the different pressures as you move down your breast. Stop when you feel your ribs. ? Move your fingers a little toward the center of your body. ? Start making circles with your fingers again, this time going up until you reach your collarbone. ? Keep making up-and-down circles until you reach your armpit. Remember to keep using the three pressures. ? Feel the other breast in the same way. 3. Sit or stand in the tub or shower. 4. With soapy water on your skin, feel each breast the same way you did in step 2 when you were lying on the floor. Write down what you find Writing down what you find can help you remember what to tell your doctor. Write down:  What is normal for each breast.  Any changes you find in each breast, including: ? The kind of changes you find. ? Whether you have pain. ? Size and location of any lumps.  When you last had your menstrual period. General tips  Check your breasts every month.  If you are breastfeeding, the best time to check your breasts is after you feed your baby or after you use a breast pump.  If you get menstrual periods, the best time to check your breasts is 5-7 days after your menstrual period is  over.  With time, you will become comfortable with the self-exam, and you will begin to know if there are changes in your breasts. Contact a doctor if you:  See a change in the shape or size of your breasts or nipples.  See a change in the skin of your breast or nipples, such as red or scaly skin.  Have fluid coming from your nipples that is not normal.  Find a lump or thick area that was not there before.  Have pain in your breasts.  Have any concerns about your breast health. Summary  Breast self-awareness includes looking for changes in your breasts, as well as feeling for changes within your breasts.  Breast self-awareness should be done in front of a mirror in a  well-lit room.  You should check your breasts every month. If you get menstrual periods, the best time to check your breasts is 5-7 days after your menstrual period is over.  Let your doctor know of any changes you see in your breasts, including changes in size, changes on the skin, pain or tenderness, or fluid from your nipples that is not normal. This information is not intended to replace advice given to you by your health care provider. Make sure you discuss any questions you have with your health care provider. Document Released: 07/13/2007 Document Revised: 09/12/2017 Document Reviewed: 09/12/2017 Elsevier Patient Education  2020 [employer].

## 2019-02-11 ENCOUNTER — Other Ambulatory Visit: Payer: Self-pay | Admitting: Internal Medicine

## 2019-02-12 NOTE — Telephone Encounter (Signed)
Dr. B - please advise. 

## 2019-03-01 ENCOUNTER — Other Ambulatory Visit: Payer: Self-pay

## 2019-03-01 ENCOUNTER — Inpatient Hospital Stay
Payer: No Typology Code available for payment source | Attending: Hospice and Palliative Medicine | Admitting: Hospice and Palliative Medicine

## 2019-03-01 VITALS — BP 101/81 | HR 83 | Temp 98.5°F | Resp 16

## 2019-03-01 DIAGNOSIS — C773 Secondary and unspecified malignant neoplasm of axilla and upper limb lymph nodes: Secondary | ICD-10-CM | POA: Diagnosis not present

## 2019-03-01 DIAGNOSIS — F419 Anxiety disorder, unspecified: Secondary | ICD-10-CM | POA: Diagnosis not present

## 2019-03-01 DIAGNOSIS — R54 Age-related physical debility: Secondary | ICD-10-CM | POA: Insufficient documentation

## 2019-03-01 DIAGNOSIS — R52 Pain, unspecified: Secondary | ICD-10-CM | POA: Insufficient documentation

## 2019-03-01 DIAGNOSIS — C50312 Malignant neoplasm of lower-inner quadrant of left female breast: Secondary | ICD-10-CM | POA: Diagnosis not present

## 2019-03-01 DIAGNOSIS — C50512 Malignant neoplasm of lower-outer quadrant of left female breast: Secondary | ICD-10-CM

## 2019-03-01 DIAGNOSIS — Z170 Estrogen receptor positive status [ER+]: Secondary | ICD-10-CM | POA: Insufficient documentation

## 2019-03-01 DIAGNOSIS — Z79899 Other long term (current) drug therapy: Secondary | ICD-10-CM | POA: Diagnosis not present

## 2019-03-01 DIAGNOSIS — F32A Depression, unspecified: Secondary | ICD-10-CM

## 2019-03-01 DIAGNOSIS — F329 Major depressive disorder, single episode, unspecified: Secondary | ICD-10-CM | POA: Diagnosis not present

## 2019-03-01 NOTE — Progress Notes (Signed)
[facility]  [phone] [phone]   Name: Carolyn Roy Date: 03/01/2019 MRN: [id number]  DOB: May 19, 1965  Patient Care Team: Patient, No Pcp Per as PCP - General (General Practice) [name], [name], MD (General Surgery) Carolyn Jacks, [name], [name] (Obstetrics and Gynecology) Cammie Sickle, MD as Consulting Physician (Oncology)    REASON FOR CONSULTATION: Palliative Care consult requested for this 54 y.o. female with multiple medical problems including stage IIIa left breast cancer (initially diagnosed 06/14/2017) status post neoadjuvant chemotherapy with lumpectomy 01/2018.  Patient later treated with tamoxifen but it was discontinued due to intolerance.  She is now on anastrazole+ Zoldadex.  PMH also notable for poorly controlled diabetes and history of depression.  She also has history of chronic pain managed by the pain clinic.  Patient was referred to palliative care to help manage ongoing symptoms.  SOCIAL HISTORY:     reports that she has been smoking cigarettes. She has a 5.50 pack-year smoking history. She has quit using smokeless tobacco.  Her smokeless tobacco use included snuff. She reports that she does not drink alcohol or use drugs.   Patient married her long-term boyfriend of 8 years in June 2020.  She lives at home with her husband.  She has 2 sons and a daughter.  Patient also has 2 stepsons.  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  ADVANCE DIRECTIVES:  Does not have  CODE STATUS:   PAST MEDICAL HISTORY: Past Medical History:  Diagnosis Date  . Breast cancer ([place])   . Cancer ([place]) 06/15/2017   5.1 cm, T3,N1 (clinical): ER/ PR positive, Her 2 neu not overexpressed, High Ki 67. Neuoadjuvant chemotherapy.   . Depression   . Diabetes mellitus without complication ([place]) 6712  .  Endometriosis   . Family history of breast cancer   . Headache    migraines  . Hyperlipidemia   . Ovarian mass   . Personal history of chemotherapy   . Personal history of radiation therapy   . Pneumonia    2018    PAST SURGICAL HISTORY:  Past Surgical History:  Procedure Laterality Date  . AXILLARY LYMPH NODE BIOPSY Left 07/14/2017   Procedure: INSERTION GEL MARK CLIP LEFT AXILLA;  Surgeon: Carolyn Bellow, MD;  Location: [facility] ORS;  Service: General;  Laterality: Left;  . BREAST BIOPSY Left    Dr Carolyn Roy BREAST METASTATIC CARCINOMA  . BREAST LUMPECTOMY Left 01/12/2018  . OOPHORECTOMY    . PARTIAL MASTECTOMY WITH NEEDLE LOCALIZATION Left 01/12/2018   Procedure: PARTIAL MASTECTOMY WITH NEEDLE LOCALIZATION;  Surgeon: Carolyn Bellow, MD;  Location: [facility] ORS;  Service: General;  Laterality: Left;  . PORTACATH PLACEMENT Right 07/14/2017   Procedure: INSERTION PORT-A-CATH;  Surgeon: Carolyn Bellow, MD;  Location: [facility] ORS;  Service: General;  Laterality: Right;  . SENTINEL NODE BIOPSY Left 01/12/2018   Procedure: SENTINEL NODE BIOPSY;  Surgeon: Carolyn Bellow, MD;  Location: [facility] ORS;  Service: General;  Laterality: Left;  . TUBAL LIGATION      HEMATOLOGY/ONCOLOGY HISTORY:  Oncology History Overview Note  # MAY 2019-  clinical stage IIIA (T3N1Mx) left breast cancer s/p biopsy on 06/14/2017. -Pathology revealed grade III invasive ductal carcinoma. -Axillary FNA revealed malignant cells c/w metastatic carcinoma. Tumor was ER + (90%), PR + (30%), Her2/neu - and Ki67 70%.  CA27.29 was 7.8 on 06/14/2017.  #  She received 4 cycles of AC with Neulasta support (07/20/2017 - 08/31/2017).;  neoadjuvant Taxol on 09/14/2017.  #DEC 2019- Lumpectomy/sentinel lymph node biopsy [Dr.[name]]-complete pathologic response  # s/p RT [delayed sec to wound infection; Dr.[name]] finished RT [4/12]  # April 14th 2020- START TAM; stopped in mid-May secondary intolerance [severe migraines].  #  18th May 2020-start Arimidex [hormonal profile-postmenopausal;add Zoladex q3M]  # PN-2 sec to taxol Carolyn Roy management/ # may 2019- Endometrial sampling [Dr. Secord/Berchuck]-negative for malignancy/ # DM-2- poorly controlled.   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   # PAP SMEAR- RECOMMENDED 2022- summer  -------------------------------------------  DIAGNOSIS: left breast cancer  STAGE:  III       ;GOALS: cure  CURRENT/MOST RECENT THERAPY Tam    Cancer of midline of breast, left ([facility])  06/15/2017 Initial Diagnosis   Cancer of midline of breast, left ([place])   06/26/2017 -  Chemotherapy   The patient had DOXOrubicin (ADRIAMYCIN) chemo injection 122 mg, 60 mg/m2 = 122 mg, Intravenous,  Once, 4 of 4 cycles Administration: 122 mg (07/20/2017), 122 mg (08/03/2017), 122 mg (08/17/2017), 122 mg (08/31/2017) palonosetron (ALOXI) injection 0.25 mg, 0.25 mg, Intravenous,  Once, 4 of 4 cycles Administration: 0.25 mg (07/20/2017), 0.25 mg (08/03/2017), 0.25 mg (08/17/2017), 0.25 mg (08/31/2017) pegfilgrastim (NEULASTA) injection 6 mg, 6 mg, Subcutaneous, Once, 5 of 5 cycles Administration: 6 mg (07/21/2017), 6 mg (08/04/2017), 6 mg (08/18/2017), 6 mg (09/01/2017) cyclophosphamide (CYTOXAN) 1,220 mg in sodium chloride 0.9 % 250 mL chemo infusion, 600 mg/m2 = 1,220 mg, Intravenous,  Once, 4 of 4 cycles Administration: 1,220 mg (07/20/2017), 1,220 mg (08/03/2017), 1,220 mg (08/17/2017), 1,220 mg (08/31/2017) PACLitaxel (TAXOL) 162 mg in sodium chloride 0.9 % 250 mL chemo infusion (</= '80mg'$ /m2), 80 mg/m2 = 162 mg, Intravenous,  Once, 10 of 12 cycles Dose modification: 65 mg/m2 (original dose 80 mg/m2, Cycle 13, Reason: Provider Judgment, Comment: neuropathy) Administration: 162 mg (09/14/2017), 162 mg (09/21/2017), 162 mg (09/28/2017), 162 mg (10/05/2017), 162 mg (10/12/2017), 162 mg (10/19/2017), 162 mg (10/26/2017), 132 mg (11/02/2017), 132 mg (11/09/2017), 132 mg  (11/16/2017) fosaprepitant (EMEND) 150 mg, dexamethasone (DECADRON) 12 mg in sodium chloride 0.9 % 145 mL IVPB, , Intravenous,  Once, 4 of 4 cycles Administration:  (07/20/2017),  (08/03/2017),  (08/17/2017),  (08/31/2017)  for chemotherapy treatment.    Malignant neoplasm of lower-outer quadrant of left breast of female, estrogen receptor positive ([place])  11/08/2017 Initial Diagnosis   Malignant neoplasm of lower-outer quadrant of left breast of female, estrogen receptor positive ([place])     ALLERGIES:  is allergic to aspirin.  MEDICATIONS:  Current Outpatient Medications  Medication Sig Dispense Refill  . albuterol (PROVENTIL HFA;VENTOLIN HFA) 108 (90 Base) MCG/ACT inhaler Inhale 2 puffs into the lungs every 6 (six) hours as needed for wheezing or shortness of breath. 1 Inhaler 0  . anastrozole (ARIMIDEX) 1 MG tablet Take 1 tablet (1 mg total) by mouth daily. 90 tablet 3  . aspirin-acetaminophen-caffeine (EXCEDRIN MIGRAINE) 250-250-65 MG tablet Take 2 tablets by mouth daily as needed for headache.    . canagliflozin (INVOKANA) 300 MG TABS tablet Take 300 mg by mouth daily.    . citalopram (CELEXA) 20 MG tablet TAKE 1 TABLET (20 MG TOTAL) BY MOUTH DAILY. TAKE IN AM 90 tablet 1  . gabapentin (NEURONTIN) 300 MG capsule Take 3 capsules (900 mg total) by mouth 4 (four) times daily. 360 capsule 5  . glucose blood (ONE TOUCH ULTRA TEST) test  strip Use up to 4 times/day 100 each 12  . goserelin (ZOLADEX) 3.6 MG injection Inject 3.6 mg into the skin every 28 (twenty-eight) days.    [name] lidocaine-prilocaine (EMLA) cream Apply to affected area once 30 g 3  . ondansetron (ZOFRAN) 8 MG tablet Take 1 tablet (8 mg total) by mouth 2 (two) times daily as needed. Start on the third day after chemotherapy. 30 tablet 1  . oxyCODONE (OXY IR/ROXICODONE) 5 MG immediate release tablet Take 1 tablet (5 mg total) by mouth 2 (two) times daily as needed for severe pain. Must last 30 days 60 tablet 0  . prochlorperazine  (COMPAZINE) 10 MG tablet Take 1 tablet (10 mg total) by mouth every 6 (six) hours as needed (Nausea or vomiting). 30 tablet 1  . traZODone (DESYREL) 50 MG tablet TAKE 1-2 TABLETS (50-100 MG TOTAL) BY MOUTH AT BEDTIME AS NEEDED FOR SLEEP. 180 tablet 1  . Insulin Glargine (BASAGLAR KWIKPEN) 100 UNIT/ML SOPN INJECT 0.24 MLS (24 UNITS TOTAL) INTO THE SKIN AT BEDTIME. (Patient not taking: Reported on 03/01/2019) 15 pen 2  . Insulin Pen Needle 32G X 4 MM MISC 1 Units by Does not apply route every morning. Pen needles (Patient not taking: Reported on 03/01/2019) 90 each 3   No current facility-administered medications for this visit.   Facility-Administered Medications Ordered in Other Visits  Medication Dose Route Frequency Provider Last Rate Last Admin  . heparin lock flush 100 unit/mL  500 Units Intravenous Once Corcoran, Melissa C, MD      . sodium chloride flush (NS) 0.9 % injection 10 mL  10 mL Intravenous Once Corcoran, Drue Second, MD        VITAL SIGNS: BP 101/81 (BP Location: Right Arm, Patient Position: Sitting)   Pulse 83   Temp 98.5 F (36.9 C) (Tympanic)   Resp 16   LMP  (LMP Unknown) Comment: LAST PERIOD IN MAY 2019 WHEN SHE STARTED CHEMO  SpO2 97%  There were no vitals filed for this visit.  Estimated body mass index is 29.85 kg/m as calculated from the following:   Height as of 01/22/19: '5\' 5"'$  (1.651 m).   Weight as of 01/22/19: 179 lb 6.4 oz (81.4 kg).  LABS: CBC:    Component Value Date/Time   WBC 6.8 01/18/2019 1312   HGB 14.1 01/18/2019 1312   HGB 15.1 06/14/2017 1612   HCT 43.4 01/18/2019 1312   HCT 43.8 06/14/2017 1612   PLT 200 01/18/2019 1312   PLT 334 06/14/2017 1612   MCV 97.7 01/18/2019 1312   MCV 91 06/14/2017 1612   NEUTROABS 5.2 01/18/2019 1312   NEUTROABS 6.6 06/14/2017 1612   LYMPHSABS 1.2 01/18/2019 1312   LYMPHSABS 2.9 06/14/2017 1612   MONOABS 0.3 01/18/2019 1312   EOSABS 0.1 01/18/2019 1312   EOSABS 0.1 06/14/2017 1612   BASOSABS 0.0 01/18/2019  1312   BASOSABS 0.0 06/14/2017 1612   Comprehensive Metabolic Panel:    Component Value Date/Time   NA 137 01/18/2019 1312   NA 139 01/22/2018 1200   K 3.5 01/18/2019 1312   CL 99 01/18/2019 1312   CO2 24 01/18/2019 1312   BUN 12 01/18/2019 1312   BUN 8 01/22/2018 1200   CREATININE 0.60 01/18/2019 1312   GLUCOSE 464 (H) 01/18/2019 1312   CALCIUM 8.9 01/18/2019 1312   AST 23 01/18/2019 1312   ALT 18 01/18/2019 1312   ALKPHOS 103 01/18/2019 1312   BILITOT 0.4 01/18/2019 1312   BILITOT 0.4 01/22/2018  1200   PROT 6.6 01/18/2019 1312   PROT 6.4 01/22/2018 1200   ALBUMIN 3.7 01/18/2019 1312   ALBUMIN 4.1 01/22/2018 1200    RADIOGRAPHIC STUDIES: No results found.  PERFORMANCE STATUS (ECOG) : 1 - Symptomatic but completely ambulatory  Review of Systems Unless otherwise noted, a complete review of systems is negative.  Physical Exam General: NAD, frail appearing Pulmonary: Unlabored Extremities: no edema, no joint deformities Skin: no rashes Neurological: Grossly nonfocal Psychiatric: Tearful  IMPRESSION: Patient was an add-on to my schedule today.  She was supposed to be a telephone visit for Carolyn Rutter, NP but patient presented to the clinic.  I previously started patient on citalopram and trazodone.  She says that this regimen helped her depression, anxiety, and insomnia.  However, patient feels like she has had worsening depression over the last few weeks associated with grief and loss.  In the past 6 months, patient's stepbrother died of cancer and she also lost her stepfather.  Last week, patient had to euthanize her dog of 13 years.  She was very tearful as she described her loss. She is going later today to pick up her pet's ashes from cremation.   Patient denies SI/HI.  Generally, I would recommend dose increase of patient's citalopram.  However, patient has a visit scheduled in 3 days with Dr. Nicolasa Ducking with psychiatry.  We will defer making changes to patient's  antidepressant regimen to psychiatry.   Note that patient is maxed on gabapentin for neuropathic pain.  Cymbalta or amitriptyline could help with symptoms if psychiatry felt those were better options for managing patient's depression/anxiety.  Otherwise, could consider rotating to Lyrica.  We did discuss referral for counseling.  I would recommend grief counseling through hospice.  Patient also has an appointment next week to establish PCP with Marnee Guarneri, NP.   PLAN: -Continue current scope of treatment -Continue citalopram/trazodone -Appointments next week with psychiatry and PCP -Recommend grief counseling through [facility] [phone]) -Follow up telephone visit with Carolyn Rutter, NP in 1-2 months   Patient expressed understanding and was in agreement with this plan. She also understands that She can call the clinic at any time with any questions, concerns, or complaints.     Time Total: 20 minutes  Visit consisted of counseling and education dealing with the complex and emotionally intense issues of symptom management and palliative care in the setting of serious and potentially life-threatening illness.Greater than 50%  of this time was spent counseling and coordinating care related to the above assessment and plan.  Signed by: Altha Harm, PhD, NP-C [phone] (Work Cell)

## 2019-03-03 ENCOUNTER — Encounter: Payer: Self-pay | Admitting: Nurse Practitioner

## 2019-03-06 ENCOUNTER — Other Ambulatory Visit: Payer: Self-pay

## 2019-03-06 ENCOUNTER — Encounter: Payer: Self-pay | Admitting: Pain Medicine

## 2019-03-06 ENCOUNTER — Ambulatory Visit (INDEPENDENT_AMBULATORY_CARE_PROVIDER_SITE_OTHER): Payer: No Typology Code available for payment source | Admitting: Nurse Practitioner

## 2019-03-06 ENCOUNTER — Telehealth: Payer: Self-pay

## 2019-03-06 ENCOUNTER — Encounter: Payer: Self-pay | Admitting: Nurse Practitioner

## 2019-03-06 VITALS — BP 102/68 | HR 85 | Temp 98.1°F | Ht 65.0 in | Wt 177.0 lb

## 2019-03-06 DIAGNOSIS — F17219 Nicotine dependence, cigarettes, with unspecified nicotine-induced disorders: Secondary | ICD-10-CM

## 2019-03-06 DIAGNOSIS — E785 Hyperlipidemia, unspecified: Secondary | ICD-10-CM

## 2019-03-06 DIAGNOSIS — E1169 Type 2 diabetes mellitus with other specified complication: Secondary | ICD-10-CM | POA: Diagnosis not present

## 2019-03-06 DIAGNOSIS — F322 Major depressive disorder, single episode, severe without psychotic features: Secondary | ICD-10-CM

## 2019-03-06 DIAGNOSIS — Z23 Encounter for immunization: Secondary | ICD-10-CM | POA: Diagnosis not present

## 2019-03-06 DIAGNOSIS — E1165 Type 2 diabetes mellitus with hyperglycemia: Secondary | ICD-10-CM

## 2019-03-06 DIAGNOSIS — C50512 Malignant neoplasm of lower-outer quadrant of left female breast: Secondary | ICD-10-CM

## 2019-03-06 DIAGNOSIS — Z794 Long term (current) use of insulin: Secondary | ICD-10-CM

## 2019-03-06 DIAGNOSIS — Z170 Estrogen receptor positive status [ER+]: Secondary | ICD-10-CM

## 2019-03-06 LAB — MICROALBUMIN, URINE WAIVED
Creatinine, Urine Waived: 10 mg/dL (ref 10–300)
Microalb, Ur Waived: 10 mg/L (ref 0–19)
Microalb/Creat Ratio: <30 mg/g (ref <30)

## 2019-03-06 LAB — BAYER DCA HB A1C WAIVED: HB A1C (BAYER DCA - WAIVED): 10.8 % — ABNORMAL HIGH (ref <7.0)

## 2019-03-06 MED ORDER — BASAGLAR KWIKPEN 100 UNIT/ML ~~LOC~~ SOPN: 10.0000 [IU] | PEN_INJECTOR | Freq: Every day | SUBCUTANEOUS | 2 refills | Status: DC

## 2019-03-06 MED ORDER — CANAGLIFLOZIN 300 MG PO TABS: 300.0000 mg | ORAL_TABLET | Freq: Every day | ORAL | 3 refills | Status: DC

## 2019-03-06 NOTE — Assessment & Plan Note (Signed)
Chronic, ongoing.  No current statin.  Check lipid panel and CMP today.  Would benefit from statin therapy with her diabetes.

## 2019-03-06 NOTE — Progress Notes (Signed)
BP 102/68   Pulse 85   Temp 98.1 F (36.7 C) (Oral)   Ht 5\' 5"  (1.651 m)   Wt 177 lb (80.3 kg)   LMP  (LMP Unknown) Comment: LAST PERIOD IN MAY 2019 WHEN SHE STARTED CHEMO  SpO2 96%   BMI 29.45 kg/m    Subjective:    Patient ID: Carolyn Roy, female    DOB: 19-Aug-1965, 54 y.o.   MRN: [id number]  HPI: Carolyn Roy is a 54 y.o. female  Chief Complaint  Patient presents with  . Diabetes  . Depression   DIABETES Last A1C was 05/29/2018 and this was 10%.  She reports this will probably still be high, as her appetite fluctuates post chemo.  Continues to be followed by oncology for malignant neoplasm of left breast.  Last seen by endocrinology in April 2020, as had family loss and was going through chemo was unable to attend further appointment.  Is prescribed Basaglar 24 units, but has not taken this in quite awhile as had been out of this, about 6 months.  Continues on Invokana 300 MG.  Has tried [place] in past, but insurance stopped covering. Has tried Metformin, which she no longer wishes to take due to breast cancer, she read online this could cause breast CA.  Tried Ozempic in past with benefit.  She is a smoker, 1/2 to 1 PPD.  Quit for 6 years, but then started back.   Hypoglycemic episodes:no Polydipsia/polyuria: no Visual disturbance: no Chest pain: no Paresthesias: no Glucose Monitoring: yes  Accucheck frequency: BID  Fasting glucose: 100 - 120  Post prandial:  Evening: before meals 150 - 160 and after meals 220-240 at times  Before meals: Taking Insulin?: yes  Long acting insulin:  Short acting insulin: Blood Pressure Monitoring: not checking Retinal Examination: Not up to Date Foot Exam: Up to Date Pneumovax: provided today Influenza: Not up to Date Aspirin: no   DEPRESSION Sees Dr. Nicolasa Ducking and continues on Celexa, but patient would like to come off this which she plans to discuss with psychiatry.  States she can not cry with it.  Takes Trazodone, but  this makes her feel very tired in morning. Mood status: stable Satisfied with current treatment?: yes Symptom severity: moderate  Duration of current treatment : chronic Side effects: no Medication compliance: good compliance Psychotherapy/counseling: none Previous psychiatric medications: Trazodone and Celexa Depressed mood: yes Anxious mood: yes Anhedonia: no Significant weight loss or gain: no Insomnia: yes hard to fall asleep Fatigue: yes Feelings of worthlessness or guilt: no Impaired concentration/indecisiveness: sometimes Suicidal ideations: no Hopelessness: no Crying spells: yes Depression screen [facility] 2/9 03/06/2019 12/20/2018 04/09/2018 03/14/2018 03/08/2018  Decreased Interest 1 0 0 1 0  Down, Depressed, Hopeless 1 0 0 - 0  PHQ - 2 Score 2 0 0 1 0  Altered sleeping 1 - - - -  Tired, decreased energy 0 - - - -  Change in appetite 3 - - - -  Feeling bad or failure about yourself  0 - - - -  Trouble concentrating 0 - - - -  Moving slowly or fidgety/restless 1 - - - -  Suicidal thoughts 1 - - - -  PHQ-9 Score 8 - - - -  Some recent data might be hidden    Relevant past medical, surgical, family and social history reviewed and updated as indicated. Interim medical history since our last visit reviewed. Allergies and medications reviewed and updated.  Review of Systems  Constitutional: Negative for activity change, appetite change, diaphoresis, fatigue and fever.  Respiratory: Negative for cough, chest tightness and shortness of breath.   Cardiovascular: Negative for chest pain, palpitations and leg swelling.  Gastrointestinal: Negative.   Endocrine: Negative for cold intolerance, heat intolerance, polydipsia, polyphagia and polyuria.  Neurological: Negative.   Psychiatric/Behavioral: Negative.     Per HPI unless specifically indicated above     Objective:    BP 102/68   Pulse 85   Temp 98.1 F (36.7 C) (Oral)   Ht 5\' 5"  (1.651 m)   Wt 177 lb (80.3 kg)   LMP   (LMP Unknown) Comment: LAST PERIOD IN MAY 2019 WHEN SHE STARTED CHEMO  SpO2 96%   BMI 29.45 kg/m   Wt Readings from Last 3 Encounters:  03/06/19 177 lb (80.3 kg)  01/22/19 179 lb 6.4 oz (81.4 kg)  01/18/19 180 lb 3.2 oz (81.7 kg)    Physical Exam Vitals and nursing note reviewed.  Constitutional:      General: She is awake.     Appearance: She is well-developed and well-groomed.  HENT:     Head: Normocephalic.     Right Ear: Hearing normal.     Left Ear: Hearing normal.  Eyes:     General: Lids are normal.        Right eye: No discharge.        Left eye: No discharge.     Conjunctiva/sclera: Conjunctivae normal.     Pupils: Pupils are equal, round, and reactive to light.  Neck:     Thyroid: No thyromegaly.     Vascular: No carotid bruit.  Cardiovascular:     Rate and Rhythm: Normal rate and regular rhythm.     Heart sounds: Normal heart sounds. No murmur. No gallop.   Pulmonary:     Effort: Pulmonary effort is normal. No accessory muscle usage or respiratory distress.     Breath sounds: Normal breath sounds.  Abdominal:     General: Bowel sounds are normal.     Palpations: Abdomen is soft.  Musculoskeletal:     Cervical back: Normal range of motion and neck supple.     Right lower leg: No edema.     Left lower leg: No edema.  Skin:    General: Skin is warm and dry.  Neurological:     Mental Status: She is alert and oriented to person, place, and time.  Psychiatric:        Attention and Perception: Attention normal.        Mood and Affect: Mood normal.        Behavior: Behavior normal. Behavior is cooperative.        Thought Content: Thought content normal.        Judgment: Judgment normal.    Results for orders placed or performed in visit on 03/06/19  Bayer DCA Hb A1c Waived  Result Value Ref Range   HB A1C (BAYER DCA - WAIVED) 10.8 (H) <7.0 %  Microalbumin, Urine Waived  Result Value Ref Range   Microalb, Ur Waived 10 0 - 19 mg/L   Creatinine, Urine Waived  10 10 - 300 mg/dL   Microalb/Creat Ratio <30 <30 mg/g      Assessment & Plan:   Problem List Items Addressed This Visit      Endocrine   Hyperlipidemia associated with type 2 diabetes mellitus (HCC)    Chronic, ongoing.  No current statin.  Check lipid panel and CMP today.  Would benefit from statin therapy with her diabetes.      Relevant Medications   Insulin Glargine (BASAGLAR KWIKPEN) 100 UNIT/ML SOPN   canagliflozin (INVOKANA) 300 MG TABS tablet   Type 2 diabetes mellitus with hyperglycemia, with long-term current use of insulin (HCC) - Primary    Chronic, ongoing with A1C today 10.8% with urine ALB 10.  Previously followed by endo, but prefers not to return at this time.  Poor adherence to regimen for past 6 months.  Will continue Invokana and restart Basaglar at 10 units.  She would like to come off insulin in future, could try GLP.  Referral to CCM team for collaborative approach.  Recommend continue to monitor BS at home BID and focus on diet changes.  Return to office in 4 weeks.      Relevant Medications   Insulin Glargine (BASAGLAR KWIKPEN) 100 UNIT/ML SOPN   canagliflozin (INVOKANA) 300 MG TABS tablet   Other Relevant Orders   Bayer DCA Hb A1c Waived (Completed)   Microalbumin, Urine Waived (Completed)   Comprehensive metabolic panel   Lipid Panel w/o Chol/HDL Ratio   Referral to Chronic Care Management Services     Nervous and Auditory   Nicotine dependence, cigarettes, w unsp disorders    I have recommended complete cessation of tobacco use. I have discussed various options available for assistance with tobacco cessation including over the counter methods (Nicotine gum, patch and lozenges). We also discussed prescription options (Chantix, Nicotine Inhaler / Nasal Spray). The patient is not interested in pursuing any prescription tobacco cessation options at this time.         Other   Severe depression (HCC)    Chronic, ongoing.  Continue current medication  regimen and collaboration with psychiatry.      Malignant neoplasm of lower-outer quadrant of left breast of female, estrogen receptor positive ([place])    Continue collaboration with oncology and palliative.       Other Visit Diagnoses    Need for pneumococcal vaccine       Relevant Orders   Pneumococcal polysaccharide vaccine 23-valent greater than or equal to 2yo subcutaneous/IM (Completed)       Follow up plan: Return in about 4 weeks (around 04/03/2019) for T2DM.

## 2019-03-06 NOTE — Assessment & Plan Note (Signed)
I have recommended complete cessation of tobacco use. I have discussed various options available for assistance with tobacco cessation including over the counter methods (Nicotine gum, patch and lozenges). We also discussed prescription options (Chantix, Nicotine Inhaler / Nasal Spray). The patient is not interested in pursuing any prescription tobacco cessation options at this time.  

## 2019-03-06 NOTE — Assessment & Plan Note (Addendum)
Chronic, ongoing with A1C today 10.8% with urine ALB 10.  Previously followed by endo, but prefers not to return at this time.  Poor adherence to regimen for past 6 months.  Will continue Invokana and restart Basaglar at 10 units.  She would like to come off insulin in future, could try GLP.  Referral to CCM team for collaborative approach.  Recommend continue to monitor BS at home BID and focus on diet changes.  Return to office in 4 weeks.

## 2019-03-06 NOTE — Assessment & Plan Note (Signed)
Chronic, ongoing.  Continue current medication regimen and collaboration with psychiatry.

## 2019-03-06 NOTE — Assessment & Plan Note (Signed)
Continue collaboration with oncology and palliative.

## 2019-03-06 NOTE — Patient Instructions (Signed)

## 2019-03-06 NOTE — Assessment & Plan Note (Deleted)
Continue collaboration with oncology and palliative.

## 2019-03-07 LAB — COMPREHENSIVE METABOLIC PANEL
ALT: 12 IU/L (ref 0–32)
AST: 11 IU/L (ref 0–40)
Albumin/Globulin Ratio: 2 (ref 1.2–2.2)
Albumin: 4.1 g/dL (ref 3.8–4.9)
Alkaline Phosphatase: 104 IU/L (ref 39–117)
BUN/Creatinine Ratio: 16 (ref 9–23)
BUN: 10 mg/dL (ref 6–24)
Bilirubin Total: 0.3 mg/dL (ref 0.0–1.2)
CO2: 24 mmol/L (ref 20–29)
Calcium: 9.6 mg/dL (ref 8.7–10.2)
Chloride: 100 mmol/L (ref 96–106)
Creatinine, Ser: 0.61 mg/dL (ref 0.57–1.00)
GFR calc Af Amer: 121 mL/min/{1.73_m2} (ref >59)
GFR calc non Af Amer: 105 mL/min/{1.73_m2} (ref >59)
Globulin, Total: 2.1 g/dL (ref 1.5–4.5)
Glucose: 320 mg/dL — ABNORMAL HIGH (ref 65–99)
Potassium: 3.9 mmol/L (ref 3.5–5.2)
Sodium: 138 mmol/L (ref 134–144)
Total Protein: 6.2 g/dL (ref 6.0–8.5)

## 2019-03-07 LAB — LIPID PANEL W/O CHOL/HDL RATIO
Cholesterol, Total: 223 mg/dL — ABNORMAL HIGH (ref 100–199)
HDL: 50 mg/dL (ref >39)
LDL Chol Calc (NIH): 140 mg/dL — ABNORMAL HIGH (ref 0–99)
Triglycerides: 184 mg/dL — ABNORMAL HIGH (ref 0–149)
VLDL Cholesterol Cal: 33 mg/dL (ref 5–40)

## 2019-03-07 NOTE — Progress Notes (Signed)
Contacted via MyChart  The 10-year ASCVD risk score Mikey Bussing DC Jr., et al., 2013) is: 5.9%   Values used to calculate the score:     Age: 54 years     Sex: Female     Is Non-Hispanic African American: No     Diabetic: Yes     Tobacco smoker: Yes     Systolic Blood Pressure: [id number] mmHg     Is BP treated: No     HDL Cholesterol: 50 mg/dL     Total Cholesterol: 223 mg/dL

## 2019-03-08 ENCOUNTER — Telehealth: Payer: Self-pay | Admitting: Family Medicine

## 2019-03-08 NOTE — Chronic Care Management (AMB) (Signed)
  Care Management   Outreach Note  03/08/2019 Name: Carolyn Roy MRN: [id number] DOB: Jan 01, 1966  Referred by: Marnee Guarneri NP Reason for referral :Care Management (CM Initial outreach unsuccessful)   An unsuccessful telephone outreach was attempted today. The patient was referred to the case management team by for assistance with care management and care coordination.   Follow Up Plan: A HIPPA compliant phone message was left for the patient providing contact information and requesting a return call.  The care management team will reach out to the patient again over the next 7 days.  If patient returns call to provider office, please advise to call Embedded Care Management Care Guide Glenna Durand LPN at [phone]  Enrigue Hashimi, LPN Health Advisor, [facility] ??[email or web address] ??[phone]

## 2019-03-10 NOTE — Progress Notes (Signed)
Unsuccessful attempt to contact patient for Virtual Visit (Pain Management Telehealth)   Patient provided contact information:  [phone] (home); [phone] (mobile); (Preferred) [phone] [email or web address]   Pre-screening:  Our staff was unsuccessful in contacting Ms. White using the above provided information.   I unsuccessfully attempted to make contact with [name] on 03/11/2019 via telephone. I was unable to complete the virtual encounter due to call going directly to voicemail. I was able to leave a message where I clearly identify myself as Gaspar Cola, MD and I left a message to call us back to reschedule the call.  Pharmacotherapy Assessment  Analgesic: Oxycodone IR 5 mg, 1 tab PO BID (10 mg/day of oxycodone) (Average: 2 tabs/day = 10 mg/day of oxycodone) MME/day: 15 mg/day.   Follow-up plan:   Reschedule Visit.     Considering:   Possible bilateral lumbar facet RFA  Diagnostic lumbar sympathetic block  Diagnostic right knee Hyalgan series #1  Diagnostic right genicular NB  Possible right genicular nerve RFA    Palliative PRN treatment(s):   Therapeutic right intra-articular knee joint injection #2 (steroids)  Diagnostic bilateral lumbar facet block #2     Recent Visits Date Type Provider Dept  12/20/18 Procedure visit Milinda Pointer, MD [facility]  12/12/18 Telemedicine Milinda Pointer, MD [facility]  Showing recent visits within past 90 days and meeting all other requirements   Today's Visits Date Type Provider Dept  03/11/19 Telemedicine Milinda Pointer, MD [facility]  Showing today's visits and meeting all other requirements   Future Appointments No visits were found meeting these conditions.  Showing future appointments within next 90 days and meeting all other requirements    Note by: Gaspar Cola, MD Date: 03/11/2019; Time: 2:30 PM

## 2019-03-11 ENCOUNTER — Other Ambulatory Visit: Payer: Self-pay

## 2019-03-11 ENCOUNTER — Ambulatory Visit (HOSPITAL_BASED_OUTPATIENT_CLINIC_OR_DEPARTMENT_OTHER): Payer: No Typology Code available for payment source | Admitting: Pain Medicine

## 2019-03-11 DIAGNOSIS — G894 Chronic pain syndrome: Secondary | ICD-10-CM

## 2019-03-12 ENCOUNTER — Encounter: Payer: Self-pay | Admitting: Pain Medicine

## 2019-03-12 NOTE — Progress Notes (Signed)
Pain relief after procedure (treated area only): (Questions asked to patient) 1. Starting about 15 minutes after the procedure, and "while the area was still numb" (from the local anesthetics), were you having any of your usual pain "in that area" (the treated area)?  (NOTE: NOT including the discomfort from the needle sticks.) First 1 hour: 100 % better. First 4-6 hours: 100 % better. 2. How long did the numbness from the local anesthetics last? (More than 6 hours?) Duration: 9 hours.  3. How much better is your pain now, when compared to before the procedure? Current benefit: 0 % better. 4. Can you move better now? Improvement in ROM (Range of Motion): No. 5. Can you do more now? Improvement in function: No. 4. Did you have any problems with the procedure? Side-effects/Complications: No.

## 2019-03-13 ENCOUNTER — Ambulatory Visit: Payer: No Typology Code available for payment source | Attending: Pain Medicine | Admitting: Pain Medicine

## 2019-03-13 ENCOUNTER — Other Ambulatory Visit: Payer: Self-pay

## 2019-03-13 DIAGNOSIS — M25562 Pain in left knee: Secondary | ICD-10-CM

## 2019-03-13 DIAGNOSIS — M25551 Pain in right hip: Secondary | ICD-10-CM | POA: Diagnosis not present

## 2019-03-13 DIAGNOSIS — G5793 Unspecified mononeuropathy of bilateral lower limbs: Secondary | ICD-10-CM

## 2019-03-13 DIAGNOSIS — M792 Neuralgia and neuritis, unspecified: Secondary | ICD-10-CM

## 2019-03-13 DIAGNOSIS — M25552 Pain in left hip: Secondary | ICD-10-CM

## 2019-03-13 DIAGNOSIS — M79671 Pain in right foot: Secondary | ICD-10-CM

## 2019-03-13 DIAGNOSIS — M79672 Pain in left foot: Secondary | ICD-10-CM

## 2019-03-13 DIAGNOSIS — M79642 Pain in left hand: Secondary | ICD-10-CM

## 2019-03-13 DIAGNOSIS — M1711 Unilateral primary osteoarthritis, right knee: Secondary | ICD-10-CM

## 2019-03-13 DIAGNOSIS — M25561 Pain in right knee: Secondary | ICD-10-CM

## 2019-03-13 DIAGNOSIS — G8929 Other chronic pain: Secondary | ICD-10-CM

## 2019-03-13 DIAGNOSIS — M79641 Pain in right hand: Secondary | ICD-10-CM | POA: Diagnosis not present

## 2019-03-13 DIAGNOSIS — G894 Chronic pain syndrome: Secondary | ICD-10-CM | POA: Diagnosis not present

## 2019-03-13 MED ORDER — PREGABALIN 50 MG PO CAPS: 50.0000 mg | ORAL_CAPSULE | Freq: Three times a day (TID) | ORAL | 0 refills | Status: DC

## 2019-03-13 MED ORDER — OXYCODONE HCL 5 MG PO TABS: 5.0000 mg | ORAL_TABLET | Freq: Two times a day (BID) | ORAL | 0 refills | Status: DC | PRN

## 2019-03-13 NOTE — Patient Instructions (Signed)
____________________________________________________________________________________________  Preparing for your procedure (without sedation)  Procedure appointments are limited to planned procedures: . No Prescription Refills. . No disability issues will be discussed. . No medication changes will be discussed.  Instructions: . Oral Intake: Do not eat or drink anything for at least 3 hours prior to your procedure. . Transportation: Unless otherwise stated by your physician, you may drive yourself after the procedure. . Blood Pressure Medicine: Take your blood pressure medicine with a sip of water the morning of the procedure. . Blood thinners: Notify our staff if you are taking any blood thinners. Depending on which one you take, there will be specific instructions on how and when to stop it. . Diabetics on insulin: Notify the staff so that you can be scheduled 1st case in the morning. If your diabetes requires high dose insulin, take only  of your normal insulin dose the morning of the procedure and notify the staff that you have done so. . Preventing infections: Shower with an antibacterial soap the morning of your procedure.  . Build-up your immune system: Take 1000 mg of Vitamin C with every meal (3 times a day) the day prior to your procedure. . Antibiotics: Inform the staff if you have a condition or reason that requires you to take antibiotics before dental procedures. . Pregnancy: If you are pregnant, call and cancel the procedure. . Sickness: If you have a cold, fever, or any active infections, call and cancel the procedure. . Arrival: You must be in the facility at least 30 minutes prior to your scheduled procedure. . Children: Do not bring any children with you. . Dress appropriately: Bring dark clothing that you would not mind if they get stained. . Valuables: Do not bring any jewelry or valuables.  Reasons to call and reschedule or cancel your procedure: (Following these  recommendations will minimize the risk of a serious complication.) . Surgeries: Avoid having procedures within 2 weeks of any surgery. (Avoid for 2 weeks before or after any surgery). . Flu Shots: Avoid having procedures within 2 weeks of a flu shots or . (Avoid for 2 weeks before or after immunizations). . Barium: Avoid having a procedure within 7-10 days after having had a radiological study involving the use of radiological contrast. (Myelograms, Barium swallow or enema study). . Heart attacks: Avoid any elective procedures or surgeries for the initial 6 months after a "Myocardial Infarction" (Heart Attack). . Blood thinners: It is imperative that you stop these medications before procedures. Let us know if you if you take any blood thinner.  . Infection: Avoid procedures during or within two weeks of an infection (including chest colds or gastrointestinal problems). Symptoms associated with infections include: Localized redness, fever, chills, night sweats or profuse sweating, burning sensation when voiding, cough, congestion, stuffiness, runny nose, sore throat, diarrhea, nausea, vomiting, cold or Flu symptoms, recent or current infections. It is specially important if the infection is over the area that we intend to treat. . Heart and lung problems: Symptoms that may suggest an active cardiopulmonary problem include: cough, chest pain, breathing difficulties or shortness of breath, dizziness, ankle swelling, uncontrolled high or unusually low blood pressure, and/or palpitations. If you are experiencing any of these symptoms, cancel your procedure and contact your primary care physician for an evaluation.  Remember:  Regular Business hours are:  Monday to Thursday 8:00 AM to 4:00 PM  Provider's Schedule: Khiya Friese, MD:  Procedure days: Tuesday and Thursday 7:30 AM to 4:00 PM  Bilal   Lateef, MD:  Procedure days: Monday and Wednesday 7:30 AM to 4:00  PM ____________________________________________________________________________________________    

## 2019-03-13 NOTE — Progress Notes (Signed)
Patient: Carolyn Roy  Service Category: E/M  Provider: Gaspar Cola, MD  DOB: 1965-12-20  DOS: 03/13/2019  Location: Office  MRN: [id number]  Setting: Ambulatory outpatient  Referring Provider: Guadalupe Maple, MD  Type: Established Patient  Specialty: Interventional Pain Management  PCP: Guadalupe Maple, MD  Location: Remote location  Delivery: TeleHealth     Virtual Encounter - Pain Management PROVIDER NOTE: Information contained herein reflects review and annotations entered in association with encounter. Interpretation of such information and data should be left to medically-trained personnel. Information provided to patient can be located elsewhere in the medical record under "Patient Instructions". Document created using STT-dictation technology, any transcriptional errors that may result from process are unintentional.    Contact & Pharmacy Preferred: [phone] Home: [phone] (home) Mobile: [phone] (mobile) E-mail: [email or web address]  [id number] - [place], [place] - [address] [place] [address] Phone: [phone] Fax: [phone]   Pre-screening  Carolyn Roy offered "in-person" vs "virtual" encounter. She indicated preferring virtual for this encounter.   Reason COVID-19*  Social distancing based on CDC and AMA recommendations.   I contacted Carolyn Roy on 03/13/2019 via telephone.      I clearly identified myself as Gaspar Cola, MD. I verified that I was speaking with the correct person using two identifiers (Name: Carolyn Roy, and date of birth: May 23, 1965).  Consent I sought verbal advanced consent from Carolyn Roy for virtual visit interactions. I informed Carolyn Roy of possible security and privacy concerns, risks, and limitations associated with providing "not-in-person" medical evaluation and management services. I also informed Carolyn Roy of the availability of "in-person" appointments. Finally, I informed her  that there would be a charge for the virtual visit and that she could be  personally, fully or partially, financially responsible for it. Carolyn Roy expressed understanding and agreed to proceed.   Historic Elements   Carolyn Roy is a 54 y.o. year old, female patient evaluated today after her last encounter by our practice on 03/06/2019. Carolyn Roy  has a past medical history of Breast cancer ([place]), Cancer ([place]) (06/15/2017), Depression, Diabetes mellitus without complication ([place]) (0000000), Endometriosis, Family history of breast cancer, Headache, Hyperlipidemia, Ovarian mass, Personal history of chemotherapy, Personal history of radiation therapy, and Pneumonia. She also  has a past surgical history that includes Oophorectomy; Tubal ligation; Portacath placement (Right, 07/14/2017); Axillary lymph node biopsy (Left, 07/14/2017); Breast lumpectomy (Left, 01/12/2018); Breast biopsy (Left); Partial mastectomy with needle localization (Left, 01/12/2018); and Sentinel node biopsy (Left, 01/12/2018). Carolyn Roy has a current medication list which includes the following prescription(s): albuterol, anastrozole, aspirin-acetaminophen-caffeine, canagliflozin, citalopram, gabapentin, glucose blood, goserelin, basaglar kwikpen, lidocaine-prilocaine, ondansetron, prochlorperazine, trazodone, [START ON 03/23/2019] oxycodone, [START ON 04/22/2019] oxycodone, [START ON 05/22/2019] oxycodone, and pregabalin, and the following Facility-Administered Medications: heparin lock flush and sodium chloride flush. She  reports that she has been smoking cigarettes. She has a 5.50 pack-year smoking history. She has quit using smokeless tobacco.  Her smokeless tobacco use included snuff. She reports that she does not drink alcohol or use drugs. Carolyn Roy is allergic to aspirin.   HPI  Today, she is being contacted for both, medication management and a post-procedure assessment. The patient indicates doing well with the current medication  regimen. No adverse reactions or side effects reported to the medications.  However, even though the patient is taking gabapentin 900 mg 4 times a day, she indicates that at nighttime this is not  working for her and she still waking up in the middle of the night with the burning pain.  She was recently given some trazodone to take along with a gabapentin at night to see if by any chance this would help.  She indicates that it tends to help intermittently, but when it does not help and she gets up in the middle of the night, she feels very drunk, to the point where she is very unsteady on her feet.  In addition to this, the patient is also taking Celexa and I am concerned about the possibility of her developing a serotonin syndrome.  Today I have talked to her about this and she indicates that she will be seen her psychiatrist and they will be adjusting her medications soon.  I provided the patient with some information regarding serotonin syndrome so that she would be aware of the symptoms.  She indicated that she is not having any of those at this point and therefore I have not instructed her to immediately stop taking that to antidepressants.  However, because she is unsteady on her feet when she takes the trazodone, I am very concerned about this and so is she.  In order to attempt to fix this problem, we will go ahead and do a trial of Lyrica to see if it works better for her than the gabapentin.  We have to remember that gabapentin has a problem with the association of the does affect her that can occur at a certain point and it does not matter how much you increase the dose, the effect may not improve.  However, this does not happen with the Lyrica and therefore I believe that she may be a good candidate for trial.  I have talked to the patient about this trial and she has agreed with it.  I have instructed her to decrease her gabapentin from 900 mg 4 times a day to 3 times a day, followed by twice a day,  followed by once a day, at which time then she is to start the Lyrica.  I will go ahead and start her at 50 minutes 3 times daily but we will quickly increase it to a maximum of 450 mg, if needed.  However, I do want to go up slowly just in case she has a problem tolerating the new medication.  In addition, today I have reviewed the results of her recent knee injection with a local anesthetic and steroid and clearly because she did obtain good relief while the local anesthetic was in place, this would suggest that the pain is in fact coming from the knee itself.  However, she did not get any benefit from the steroids suggesting that the mechanism of the pain may not be inflammatory.  Because of this, I have offered the patient to do a series of 5 intra-articular Hyalgan knee injections to see if this could benefit her.  If it does not, it would suggest that she has lost all of her cartilage in the next step would be to confirm this with an MRI, followed by a possible knee replacement.  The patient understood and accepted.  Post-Procedure Evaluation  Procedure: Diagnostic IA Local anesthetic and steroid Knee Injection #2  Pre-procedure pain level:  6/10 Post-procedure: 0/10 (100% relief)  Sedation: None.  [name], RN  03/12/2019 10:02 AM  Sign when Signing Visit Pain relief after procedure (treated area only): (Questions asked to patient) 1. Starting about 15 minutes after the  procedure, and "while the area was still numb" (from the local anesthetics), were you having any of your usual pain "in that area" (the treated area)?  (NOTE: NOT including the discomfort from the needle sticks.) First 1 hour: 100 % better. First 4-6 hours: 100 % better. 2. How long did the numbness from the local anesthetics last? (More than 6 hours?) Duration: 9 hours.  3. How much better is your pain now, when compared to before the procedure? Current benefit: 0 % better. 4. Can you move better now? Improvement in  ROM (Range of Motion): No. 5. Can you do more now? Improvement in function: No. 4. Did you have any problems with the procedure? Side-effects/Complications: No.  Current benefits: Defined as benefit that persist at this time.   Analgesia:  Back to baseline Function: No improvement ROM: No improvement  Pharmacotherapy Assessment  Analgesic: Oxycodone IR 5 mg, 1 tab PO BID (10 mg/day of oxycodone) (Average: 2 tabs/day = 10 mg/day of oxycodone) MME/day: 15 mg/day.   Monitoring: [place] PMP: PDMP reviewed during this encounter.       Pharmacotherapy: No side-effects or adverse reactions reported. Compliance: No problems identified. Effectiveness: Clinically acceptable. Plan: Refer to "POC".  UDS:  Summary  Date Value Ref Range Status  01/22/2018 FINAL  Final    Comment:    ==================================================================== TOXASSURE COMP DRUG ANALYSIS,UR ==================================================================== Test                             Result       Flag       Units Drug Present and Declared for Prescription Verification   Norhydrocodone                 255          EXPECTED   ng/mg creat    Norhydrocodone is an expected metabolite of hydrocodone.   Tramadol                       5912         EXPECTED   ng/mg creat   O-Desmethyltramadol            7009         EXPECTED   ng/mg creat   N-Desmethyltramadol            1209         EXPECTED   ng/mg creat    Source of tramadol is a prescription medication.    O-desmethyltramadol and N-desmethyltramadol are expected    metabolites of tramadol.   Gabapentin                     PRESENT      EXPECTED   Acetaminophen                  PRESENT      EXPECTED Drug Present not Declared for Prescription Verification   Alcohol, Ethyl                 0.242        UNEXPECTED g/dL    Sources of ethyl alcohol include alcoholic beverages or as a    fermentation product of glucose; glucose is present in this     specimen.  Interpret result with caution, as the presence of    ethyl alcohol is likely due, at least in part, to fermentation of  glucose.   Naproxen                       PRESENT      UNEXPECTED Drug Absent but Declared for Prescription Verification   Hydrocodone                    Not Detected UNEXPECTED ng/mg creat    Hydrocodone is almost always present in patients taking this drug    consistently. Absence of hydrocodone could be due to lapse of    time since the last dose or unusual pharmacokinetics (rapid    metabolism).   Prochlorperazine               Not Detected UNEXPECTED   Salicylate                     Not Detected UNEXPECTED    Aspirin, as indicated in the declared medication list, is not    always detected even when used as directed.   Lidocaine                      Not Detected UNEXPECTED    Lidocaine, as indicated in the declared medication list, is not    always detected even when used as directed. ==================================================================== Test                      Result    Flag   Units      Ref Range   Creatinine              33               mg/dL      >=20 ==================================================================== Declared Medications:  The flagging and interpretation on this report are based on the  following declared medications.  Unexpected results may arise from  inaccuracies in the declared medications.  **Note: The testing scope of this panel includes these medications:  Gabapentin (Neurontin)  Hydrocodone (Norco)  Prochlorperazine (Compazine)  Tramadol (Ultram)  **Note: The testing scope of this panel does not include small to  moderate amounts of these reported medications:  Acetaminophen (Excedrin)  Acetaminophen (Norco)  Aspirin (Excedrin)  Lidocaine (Lidocaine-Prilocaine)  **Note: The testing scope of this panel does not include following  reported medications:  Albuterol  Caffeine (Excedrin)   Canagliflozin (Invokana)  Insulin  Ondansetron (Zofran)  Prilocaine (Lidocaine-Prilocaine)  Vitamin D3 ==================================================================== For clinical consultation, please call [phone]. ====================================================================    Laboratory Chemistry Profile (12 mo)  Renal: 03/06/2019: BUN 10; BUN/Creatinine Ratio 16; Creatinine, Ser 0.61  Lab Results  Component Value Date   GFRAA 121 03/06/2019   GFRNONAA 105 03/06/2019   Hepatic: 03/06/2019: Albumin 4.1 Lab Results  Component Value Date   AST 11 03/06/2019   ALT 12 03/06/2019   Other: No results found for requested labs within last 8760 hours.  Note: Above Lab results reviewed.  Imaging  MM DIAG BREAST TOMO BILATERAL CLINICAL DATA:  History of treated left breast cancer status post breast conservation therapy in 2019.  EXAM: DIGITAL DIAGNOSTIC BILATERAL MAMMOGRAM WITH CAD AND TOMO  COMPARISON:  Previous exam(s).  ACR Breast Density Category c: The breast tissue is heterogeneously dense, which may obscure small masses.  FINDINGS: Mammographically, there are no suspicious masses, areas of nonsurgical architectural distortion or microcalcifications in either breast. Expected posttreatment changes in the left breast with moderate breast edema noted. Injectable port overlies  the medial right breast.  Mammographic images were processed with CAD.  IMPRESSION: No mammographic evidence of malignancy status post left lumpectomy and radiation therapy.  RECOMMENDATION: Diagnostic mammogram is suggested in 1 year. (Code:DM-B-01Y)  I have discussed the findings and recommendations with the patient. Results were also provided in writing at the conclusion of the visit. If applicable, a reminder letter will be sent to the patient regarding the next appointment.  BI-RADS CATEGORY  2: Benign.  Electronically Signed   By: Fidela Salisbury M.D.   On:  09/07/2018 11:40   Assessment  The primary encounter diagnosis was Chronic pain syndrome. Diagnoses of Chronic feet pain (Primary Area of Pain) (Bilateral) (R>L), Chronic knee pain (Secondary Area of Pain) (Bilateral) (R>L), Chronic hand pain (Tertiary Area of Pain) (Bilateral) (R>L), Chronic hip pain (Fourth Area of Pain) (Bilateral) (R>L), Osteoarthritis of knee (Right), Neuropathic pain, and Neuropathic pain of feet (Bilateral) were also pertinent to this visit.  Plan of Care  Problem-specific:  No problem-specific Assessment & Plan notes found for this encounter.  I am having Carolyn Roy start on oxyCODONE, oxyCODONE, and pregabalin. I am also having her maintain her glucose blood, albuterol, ondansetron, prochlorperazine, lidocaine-prilocaine, aspirin-acetaminophen-caffeine, goserelin, anastrozole, gabapentin, citalopram, traZODone, Basaglar KwikPen, canagliflozin, and oxyCODONE.  Pharmacotherapy (Medications Ordered): Meds ordered this encounter  Medications  . oxyCODONE (OXY IR/ROXICODONE) 5 MG immediate release tablet    Sig: Take 1 tablet (5 mg total) by mouth 2 (two) times daily as needed for severe pain. Must last 30 days    Dispense:  60 tablet    Refill:  0    Chronic Pain: STOP Act (Not applicable) Fill 1 day early if closed on refill date. Do not fill until: 03/23/2019. To last until: 04/22/2019. Avoid benzodiazepines within 8 hours of opioids  . oxyCODONE (OXY IR/ROXICODONE) 5 MG immediate release tablet    Sig: Take 1 tablet (5 mg total) by mouth 2 (two) times daily as needed for severe pain. Must last 30 days    Dispense:  60 tablet    Refill:  0    Chronic Pain: STOP Act (Not applicable) Fill 1 day early if closed on refill date. Do not fill until: 04/22/2019. To last until: 05/22/2019. Avoid benzodiazepines within 8 hours of opioids  . oxyCODONE (OXY IR/ROXICODONE) 5 MG immediate release tablet    Sig: Take 1 tablet (5 mg total) by mouth 2 (two) times daily as needed  for severe pain. Must last 30 days    Dispense:  60 tablet    Refill:  0    Chronic Pain: STOP Act (Not applicable) Fill 1 day early if closed on refill date. Do not fill until: 05/22/2019. To last until: 06/21/2019. Avoid benzodiazepines within 8 hours of opioids  . pregabalin (LYRICA) 50 MG capsule    Sig: Take 1 capsule (50 mg total) by mouth 3 (three) times daily.    Dispense:  90 capsule    Refill:  0    Fill one day early if pharmacy is closed on scheduled refill date. May substitute for generic if available.   Orders:  Orders Placed This Encounter  Procedures  . KNEE INJECTION    Hyalgan knee injection. Please order Hyalgan.    Standing Status:   Future    Standing Expiration Date:   04/10/2019    Scheduling Instructions:     Procedure: Intra-articular Hyalgan Knee injection #1     Side: Right-sided     Sedation: None  Timeframe: ASAA    Order Specific Question:   Where will this procedure be performed?    Answer:   [facility]   Follow-up plan:   Return in about 14 weeks (around 06/19/2019) for (VV), (MM), in addition, Procedure (no sedation): (R) Hyalgan #1.      Considering:   Possible bilateral lumbar facet RFA  Diagnostic lumbar sympathetic block  Diagnostic right knee Hyalgan series #1  Diagnostic right genicular NB  Possible right genicular nerve RFA    Palliative PRN treatment(s):   Therapeutic right IA knee joint injection #3 (steroids)  Diagnostic bilateral lumbar facet block #2     Recent Visits Date Type Provider Dept  12/20/18 Procedure visit Milinda Pointer, MD [facility]  Showing recent visits within past 90 days and meeting all other requirements   Today's Visits Date Type Provider Dept  03/13/19 Telemedicine Milinda Pointer, MD [facility]  Showing today's visits and meeting all other requirements   Future Appointments No visits were found meeting these conditions.  Showing future appointments within next 90  days and meeting all other requirements   I discussed the assessment and treatment plan with the patient. The patient was provided an opportunity to ask questions and all were answered. The patient agreed with the plan and demonstrated an understanding of the instructions.  Patient advised to call back or seek an in-person evaluation if the symptoms or condition worsens.  Duration of encounter: 20 minutes.  Note by: Gaspar Cola, MD Date: 03/13/2019; Time: 10:10 AM

## 2019-03-14 NOTE — Progress Notes (Signed)
Appears MyChart message not viewed.  Please alert patient of following: Good afternoon Carolyn Roy. Your labs have returned and glucose is elevated, so insulin will be good to have back on board for now. I know Catie and Pam, our office [employer] pit crew, will reach out to you at some point over next week to schedule to chat. Kidney and liver function look good. Cholesterol levels are elevated. I will tell you statins are recommended in all diabetics and I do recommend starting one if you have never been on one. This is to lower cholesterol levels and prevent heart risk.

## 2019-03-15 NOTE — Chronic Care Management (AMB) (Signed)
  Care Management   Outreach Note  03/15/2019 Name: Carolyn Roy MRN: [id number] DOB: 08/24/1965  Referred by: Marnee Guarneri NP Reason for referral : Care Management (CM Initial outreach unsuccessful) and Chronic Care Management (2nd Initial outreach unsuccesful)   A second unsuccessful telephone outreach was attempted today. The patient was referred to the case management team for assistance with care management and care coordination.   Follow Up Plan: A HIPPA compliant phone message was left for the patient providing contact information and requesting a return call.  The care management team will reach out to the patient again over the next 7 days.  If patient returns call to provider office, please advise to call Embedded Care Management Care Guide Carolyn Roy at [phone]  Carolyn Zackery, Roy Health Advisor, [place] Management ??[email or web address] ??[phone]

## 2019-03-19 ENCOUNTER — Encounter: Payer: Self-pay | Admitting: Pain Medicine

## 2019-03-19 ENCOUNTER — Ambulatory Visit: Payer: No Typology Code available for payment source | Attending: Pain Medicine | Admitting: Pain Medicine

## 2019-03-19 ENCOUNTER — Other Ambulatory Visit: Payer: Self-pay

## 2019-03-19 VITALS — BP 103/63 | HR 73 | Temp 97.8°F | Resp 16 | Ht 64.0 in | Wt 177.0 lb

## 2019-03-19 DIAGNOSIS — M1711 Unilateral primary osteoarthritis, right knee: Secondary | ICD-10-CM | POA: Diagnosis present

## 2019-03-19 DIAGNOSIS — G8929 Other chronic pain: Secondary | ICD-10-CM | POA: Insufficient documentation

## 2019-03-19 DIAGNOSIS — M25562 Pain in left knee: Secondary | ICD-10-CM

## 2019-03-19 DIAGNOSIS — M25561 Pain in right knee: Secondary | ICD-10-CM | POA: Diagnosis not present

## 2019-03-19 MED ORDER — SODIUM HYALURONATE (VISCOSUP) 20 MG/2ML IX SOSY
2.0000 mL | PREFILLED_SYRINGE | Freq: Once | INTRA_ARTICULAR | Status: AC
  Administered 2019-03-19: 2 mL via INTRA_ARTICULAR

## 2019-03-19 MED ORDER — LIDOCAINE HCL (PF) 1 % IJ SOLN
5.0000 mL | Freq: Once | INTRAMUSCULAR | Status: AC
  Administered 2019-03-19: 5 mL
  Filled 2019-03-19: qty 5

## 2019-03-19 MED ORDER — ROPIVACAINE HCL 2 MG/ML IJ SOLN
5.0000 mL | Freq: Once | INTRAMUSCULAR | Status: AC
  Administered 2019-03-19: 5 mL via INTRA_ARTICULAR
  Filled 2019-03-19: qty 10

## 2019-03-19 NOTE — Patient Instructions (Addendum)
____________________________________________________________________________________________  Post-Procedure Discharge Instructions  Instructions:  Apply ice:   Purpose: This will minimize any swelling and discomfort after procedure.   When: Day of procedure, as soon as you get home.  How: Fill a plastic sandwich bag with crushed ice. Cover it with a small towel and apply to injection site.  How long: (15 min on, 15 min off) Apply for 15 minutes then remove x 15 minutes.  Repeat sequence on day of procedure, until you go to bed.  Apply heat:   Purpose: To treat any soreness and discomfort from the procedure.  When: Starting the next day after the procedure.  How: Apply heat to procedure site starting the day following the procedure.  How long: May continue to repeat daily, until discomfort goes away.  Food intake: Start with clear liquids (like water) and advance to regular food, as tolerated.   Physical activities: Keep activities to a minimum for the first 8 hours after the procedure. After that, then as tolerated.  Driving: If you have received any sedation, be responsible and do not drive. You are not allowed to drive for 24 hours after having sedation.  Blood thinner: (Applies only to those taking blood thinners) You may restart your blood thinner 6 hours after your procedure.  Insulin: (Applies only to Diabetic patients taking insulin) As soon as you can eat, you may resume your normal dosing schedule.  Infection prevention: Keep procedure site clean and dry. Shower daily and clean area with soap and water.  Post-procedure Pain Diary: Extremely important that this be done correctly and accurately. Recorded information will be used to determine the next step in treatment. For the purpose of accuracy, follow these rules:  Evaluate only the area treated. Do not report or include pain from an untreated area. For the purpose of this evaluation, ignore all other areas of pain,  except for the treated area.  After your procedure, avoid taking a long nap and attempting to complete the pain diary after you wake up. Instead, set your alarm clock to go off every hour, on the hour, for the initial 8 hours after the procedure. Document the duration of the numbing medicine, and the relief you are getting from it.  Do not go to sleep and attempt to complete it later. It will not be accurate. If you received sedation, it is likely that you were given a medication that may cause amnesia. Because of this, completing the diary at a later time may cause the information to be inaccurate. This information is needed to plan your care.  Follow-up appointment: Keep your post-procedure follow-up evaluation appointment after the procedure (usually 2 weeks for most procedures, 6 weeks for radiofrequencies). DO NOT FORGET to bring you pain diary with you.   Expect: (What should I expect to see with my procedure?)  From numbing medicine (AKA: Local Anesthetics): Numbness or decrease in pain. You may also experience some weakness, which if present, could last for the duration of the local anesthetic.  Onset: Full effect within 15 minutes of injected.  Duration: It will depend on the type of local anesthetic used. On the average, 1 to 8 hours.   From steroids (Applies only if steroids were used): Decrease in swelling or inflammation. Once inflammation is improved, relief of the pain will follow.  Onset of benefits: Depends on the amount of swelling present. The more swelling, the longer it will take for the benefits to be seen. In some cases, up to 10 days.    Duration: Steroids will stay in the system x 2 weeks. Duration of benefits will depend on multiple posibilities including persistent irritating factors.  Side-effects: If present, they may typically last 2 weeks (the duration of the steroids).  Frequent: Cramps (if they occur, drink Gatorade and take over-the-counter Magnesium 450-500 mg  once to twice a day); water retention with temporary weight gain; increases in blood sugar; decreased immune system response; increased appetite.  Occasional: Facial flushing (red, warm cheeks); mood swings; menstrual changes.  Uncommon: Long-term decrease or suppression of natural hormones; bone thinning. (These are more common with higher doses or more frequent use. This is why we prefer that our patients avoid having any injection therapies in other practices.)   Very Rare: Severe mood changes; psychosis; aseptic necrosis.  From procedure: Some discomfort is to be expected once the numbing medicine wears off. This should be minimal if ice and heat are applied as instructed.  Call if: (When should I call?)  You experience numbness and weakness that gets worse with time, as opposed to wearing off.  New onset bowel or bladder incontinence. (Applies only to procedures done in the spine)  Emergency Numbers:  Durning business hours (Monday - Thursday, 8:00 AM - 4:00 PM) (Friday, 9:00 AM - 12:00 Noon): [phone]  After hours: [phone]  NOTE: If you are having a problem and are unable connect with, or to talk to a provider, then go to your nearest urgent care or emergency department. If the problem is serious and urgent, please call 911. ____________________________________________________________________________________________   ____________________________________________________________________________________________  Preparing for your procedure (without sedation)  Procedure appointments are limited to planned procedures: . No Prescription Refills. . No disability issues will be discussed. . No medication changes will be discussed.  Instructions: . Oral Intake: Do not eat or drink anything for at least 3 hours prior to your procedure. . Transportation: Unless otherwise stated by your physician, you may drive yourself after the procedure. . Blood Pressure Medicine:  Take your blood pressure medicine with a sip of water the morning of the procedure. . Blood thinners: Notify our staff if you are taking any blood thinners. Depending on which one you take, there will be specific instructions on how and when to stop it. . Diabetics on insulin: Notify the staff so that you can be scheduled 1st case in the morning. If your diabetes requires high dose insulin, take only  of your normal insulin dose the morning of the procedure and notify the staff that you have done so. . Preventing infections: Shower with an antibacterial soap the morning of your procedure.  . Build-up your immune system: Take 1000 mg of Vitamin C with every meal (3 times a day) the day prior to your procedure. . Antibiotics: Inform the staff if you have a condition or reason that requires you to take antibiotics before dental procedures. . Pregnancy: If you are pregnant, call and cancel the procedure. . Sickness: If you have a cold, fever, or any active infections, call and cancel the procedure. . Arrival: You must be in the facility at least 30 minutes prior to your scheduled procedure. . Children: Do not bring any children with you. . Dress appropriately: Bring dark clothing that you would not mind if they get stained. . Valuables: Do not bring any jewelry or valuables.  Reasons to call and reschedule or cancel your procedure: (Following these recommendations will minimize the risk of a serious complication.) . Surgeries: Avoid having procedures within 2 weeks   of any surgery. (Avoid for 2 weeks before or after any surgery). . Flu Shots: Avoid having procedures within 2 weeks of a flu shots or . (Avoid for 2 weeks before or after immunizations). . Barium: Avoid having a procedure within 7-10 days after having had a radiological study involving the use of radiological contrast. (Myelograms, Barium swallow or enema study). . Heart attacks: Avoid any elective procedures or surgeries for the initial 6  months after a "Myocardial Infarction" (Heart Attack). . Blood thinners: It is imperative that you stop these medications before procedures. Let us know if you if you take any blood thinner.  . Infection: Avoid procedures during or within two weeks of an infection (including chest colds or gastrointestinal problems). Symptoms associated with infections include: Localized redness, fever, chills, night sweats or profuse sweating, burning sensation when voiding, cough, congestion, stuffiness, runny nose, sore throat, diarrhea, nausea, vomiting, cold or Flu symptoms, recent or current infections. It is specially important if the infection is over the area that we intend to treat. . Heart and lung problems: Symptoms that may suggest an active cardiopulmonary problem include: cough, chest pain, breathing difficulties or shortness of breath, dizziness, ankle swelling, uncontrolled high or unusually low blood pressure, and/or palpitations. If you are experiencing any of these symptoms, cancel your procedure and contact your primary care physician for an evaluation.  Remember:  Regular Business hours are:  Monday to Thursday 8:00 AM to 4:00 PM  Provider's Schedule: Korde Jeppsen, MD:  Procedure days: Tuesday and Thursday 7:30 AM to 4:00 PM  Bilal Lateef, MD:  Procedure days: Monday and Wednesday 7:30 AM to 4:00 PM ____________________________________________________________________________________________     

## 2019-03-19 NOTE — Progress Notes (Signed)
PROVIDER NOTE: Information contained herein reflects review and annotations entered in association with encounter. Interpretation of such information and data should be left to medically-trained personnel. Information provided to patient can be located elsewhere in the medical record under "Patient Instructions". Document created using STT-dictation technology, any transcriptional errors that may result from process are unintentional.    Patient: Carolyn Roy  Service Category: Procedure  Provider: Gaspar Cola, MD  DOB: 02-17-1965  DOS: 03/19/2019  Location: [facility]  MRN: [id number]  Setting: Ambulatory - outpatient  Referring Provider: Milinda Pointer, MD  Type: Established Patient  Specialty: Interventional Pain Management  PCP: Guadalupe Maple, MD   Primary Reason for Visit: Interventional Pain Management Treatment. CC: Knee Pain (right) and Dental Pain (just started )  Procedure:          Anesthesia, Analgesia, Anxiolysis:  Type: Therapeutic Intra-Articular Hyalgan Knee Injection #1  Region: Lateral infrapatellar Knee Region Level: Knee Joint Laterality: Right knee  Type: Local Anesthesia Indication(s): Analgesia         Local Anesthetic: Lidocaine 1-2% Route: Infiltration (Hooper/IM) IV Access: Declined Sedation: Declined   Position: Sitting   Indications: 1. Chronic knee pain (Secondary Area of Pain) (Bilateral) (R>L)   2. Osteoarthritis of knee (Right)    Pain Score: Pre-procedure: 4 /10 Post-procedure: 0-No pain/10   Pre-op Assessment:  [name] is a 54 y.o. (year old), female patient, seen today for interventional treatment. She  has a past surgical history that includes Oophorectomy; Tubal ligation; Portacath placement (Right, 07/14/2017); Axillary lymph node biopsy (Left, 07/14/2017); Breast lumpectomy (Left, 01/12/2018); Breast biopsy (Left); Partial mastectomy with needle localization (Left, 01/12/2018); and Sentinel node biopsy (Left,  01/12/2018). [name] has a current medication list which includes the following prescription(s): albuterol, anastrozole, aspirin-acetaminophen-caffeine, canagliflozin, citalopram, glucose blood, goserelin, basaglar kwikpen, lidocaine-prilocaine, ondansetron, [START ON 03/23/2019] oxycodone, [START ON 04/22/2019] oxycodone, [START ON 05/22/2019] oxycodone, pregabalin, prochlorperazine, trazodone, and gabapentin, and the following Facility-Administered Medications: heparin lock flush and sodium chloride flush. Her primarily concern today is the Knee Pain (right) and Dental Pain (just started )  Initial Vital Signs:  Pulse/HCG Rate: 73  Temp: 97.8 F (36.6 C) Resp: 16 BP: 103/63 SpO2: 98 %  BMI: Estimated body mass index is 30.38 kg/m as calculated from the following:   Height as of this encounter: 5\' 4"  (1.626 m).   Weight as of this encounter: 177 lb (80.3 kg).  Risk Assessment: Allergies: Reviewed. She is allergic to aspirin.  Allergy Precautions: None required Coagulopathies: Reviewed. None identified.  Blood-thinner therapy: None at this time Active Infection(s): Reviewed. None identified. [name] is afebrile  Site Confirmation: [name] was asked to confirm the procedure and laterality before marking the site Procedure checklist: Completed Consent: Before the procedure and under the influence of no sedative(s), amnesic(s), or anxiolytics, the patient was informed of the treatment options, risks and possible complications. To fulfill our ethical and legal obligations, as recommended by the American Medical Association's Code of Ethics, I have informed the patient of my clinical impression; the nature and purpose of the treatment or procedure; the risks, benefits, and possible complications of the intervention; the alternatives, including doing nothing; the risk(s) and benefit(s) of the alternative treatment(s) or procedure(s); and the risk(s) and benefit(s) of doing nothing. The patient  was provided information about the general risks and possible complications associated with the procedure. These may include, but are not limited to: failure to achieve desired goals, infection, bleeding, organ or nerve  damage, allergic reactions, paralysis, and death. In addition, the patient was informed of those risks and complications associated to the procedure, such as failure to decrease pain; infection; bleeding; organ or nerve damage with subsequent damage to sensory, motor, and/or autonomic systems, resulting in permanent pain, numbness, and/or weakness of one or several areas of the body; allergic reactions; (i.e.: anaphylactic reaction); and/or death. Furthermore, the patient was informed of those risks and complications associated with the medications. These include, but are not limited to: allergic reactions (i.e.: anaphylactic or anaphylactoid reaction(s)); adrenal axis suppression; blood sugar elevation that in diabetics may result in ketoacidosis or comma; water retention that in patients with history of congestive heart failure may result in shortness of breath, pulmonary edema, and decompensation with resultant heart failure; weight gain; swelling or edema; medication-induced neural toxicity; particulate matter embolism and blood vessel occlusion with resultant organ, and/or nervous system infarction; and/or aseptic necrosis of one or more joints. Finally, the patient was informed that Medicine is not an exact science; therefore, there is also the possibility of unforeseen or unpredictable risks and/or possible complications that may result in a catastrophic outcome. The patient indicated having understood very clearly. We have given the patient no guarantees and we have made no promises. Enough time was given to the patient to ask questions, all of which were answered to the patient's satisfaction. [name] has indicated that she wanted to continue with the procedure. Attestation: I, the  ordering provider, attest that I have discussed with the patient the benefits, risks, side-effects, alternatives, likelihood of achieving goals, and potential problems during recovery for the procedure that I have provided informed consent. Date  Time: 03/19/2019 10:40 AM  Pre-Procedure Preparation:  Monitoring: As per clinic protocol. Respiration, ETCO2, SpO2, BP, heart rate and rhythm monitor placed and checked for adequate function Safety Precautions: Patient was assessed for positional comfort and pressure points before starting the procedure. Time-out: I initiated and conducted the "Time-out" before starting the procedure, as per protocol. The patient was asked to participate by confirming the accuracy of the "Time Out" information. Verification of the correct person, site, and procedure were performed and confirmed by me, the nursing staff, and the patient. "Time-out" conducted as per Joint Commission's Universal Protocol (UP.01.01.01). Time: 1041  Description of Procedure:          Target Area: Knee Joint Approach: Just above the Lateral tibial plateau, lateral to the infrapatellar tendon. Area Prepped: Entire knee area, from the mid-thigh to the mid-shin. Prepping solution: DuraPrep (Iodine Povacrylex [0.7% available iodine] and Isopropyl Alcohol, 74% w/w) Safety Precautions: Aspiration looking for blood return was conducted prior to all injections. At no point did we inject any substances, as a needle was being advanced. No attempts were made at seeking any paresthesias. Safe injection practices and needle disposal techniques used. Medications properly checked for expiration dates. SDV (single dose vial) medications used. Description of the Procedure: Protocol guidelines were followed. The patient was placed in position over the fluoroscopy table. The target area was identified and the area prepped in the usual manner. Skin & deeper tissues infiltrated with local anesthetic. Appropriate amount  of time allowed to pass for local anesthetics to take effect. The procedure needles were then advanced to the target area. Proper needle placement secured. Negative aspiration confirmed. Solution injected in intermittent fashion, asking for systemic symptoms every 0.5cc of injectate. The needles were then removed and the area cleansed, making sure to leave some of the prepping solution back to take  advantage of its long term bactericidal properties. Vitals:   03/19/19 1016  BP: 103/63  Pulse: 73  Resp: 16  Temp: 97.8 F (36.6 C)  TempSrc: Temporal  SpO2: 98%  Weight: 177 lb (80.3 kg)  Height: 5\' 4"  (1.626 m)    Start Time: 1041 hrs. End Time:   hrs. Materials:  Needle(s) Type: Regular needle Gauge: 25G Length: 1.5-in Medication(s): Please see orders for medications and dosing details.  Imaging Guidance:          Type of Imaging Technique: None used Indication(s): N/A Exposure Time: No patient exposure Contrast: None used. Fluoroscopic Guidance: N/A Ultrasound Guidance: N/A Interpretation: N/A  Antibiotic Prophylaxis:   Anti-infectives (From admission, onward)   None     Indication(s): None identified  Post-operative Assessment:  Post-procedure Vital Signs:  Pulse/HCG Rate: 73  Temp: 97.8 F (36.6 C) Resp: 16 BP: 103/63 SpO2: 98 %  EBL: None  Complications: No immediate post-treatment complications observed by team, or reported by patient.  Note: The patient tolerated the entire procedure well. A repeat set of vitals were taken after the procedure and the patient was kept under observation following institutional policy, for this type of procedure. Post-procedural neurological assessment was performed, showing return to baseline, prior to discharge. The patient was provided with post-procedure discharge instructions, including a section on how to identify potential problems. Should any problems arise concerning this procedure, the patient was given instructions to  immediately contact us, at any time, without hesitation. In any case, we plan to contact the patient by telephone for a follow-up status report regarding this interventional procedure.  Comments:  No additional relevant information.  Plan of Care  Orders:  Orders Placed This Encounter  Procedures  . KNEE INJECTION    Hyalgan knee injection to be done by MD.    Scheduling Instructions:     Procedure: Intra-articular Hyalgan Knee injection #1     Side(s): Right Knee     Sedation: None     Timeframe: Today    Order Specific Question:   Where will this procedure be performed?    Answer:   [facility]  . KNEE INJECTION    Hyalgan knee injection. Please order Hyalgan.    Standing Status:   Future    Standing Expiration Date:   04/16/2019    Scheduling Instructions:     Procedure: Intra-articular Hyalgan Knee injection #2     Side: Right-sided     Sedation: None     Timeframe: in two (2) weeks    Order Specific Question:   Where will this procedure be performed?    Answer:   [facility]  . Informed Consent Details: Physician/Practitioner Attestation; Transcribe to consent form and obtain patient signature    Provider Attestation: I, [place] Dossie Arbour, MD, (Pain Management Specialist), the physician/practitioner, attest that I have discussed with the patient the benefits, risks, side effects, alternatives, likelihood of achieving goals and potential problems during recovery for the procedure that I have provided informed consent.    Scheduling Instructions:     Procedure: Therapeutic, right sided, intra-articular Hyalgan knee injection     Indications: Chronic right knee pain secondary to primary osteoarthritis of the knee     Note: Always confirm laterality of pain with Ms. White, before procedure.     Transcribe to consent form and obtain patient signature.  . Provide equipment / supplies at bedside    Equipment required: Single use, disposable, "Block Tray"  Standing Status:   Standing    Number of Occurrences:   1    Order Specific Question:   Specify    Answer:   Block Tray   Chronic Opioid Analgesic:  Oxycodone IR 5 mg, 1 tab PO BID (10 mg/day of oxycodone) (Average: 2 tabs/day = 10 mg/day of oxycodone) MME/day: 15 mg/day.   Medications ordered for procedure: Meds ordered this encounter  Medications  . lidocaine (PF) (XYLOCAINE) 1 % injection 5 mL  . ropivacaine (PF) 2 mg/mL (0.2%) (NAROPIN) injection 5 mL  . Sodium Hyaluronate SOSY 2 mL   Medications administered: We administered lidocaine (PF), ropivacaine (PF) 2 mg/mL (0.2%), and Sodium Hyaluronate.  See the medical record for exact dosing, route, and time of administration.  Follow-up plan:   Return in about 2 weeks (around 04/02/2019) for Procedure (no sedation): (R) Hyalgan #2.       Considering:   Possible bilateral lumbar facet RFA  Diagnostic lumbar sympathetic block  Diagnostic right knee Hyalgan series #1  Diagnostic right genicular NB  Possible right genicular nerve RFA    Palliative PRN treatment(s):   Therapeutic right IA knee joint injection #3 (steroids)  Diagnostic bilateral lumbar facet block #2      Recent Visits Date Type Provider Dept  03/13/19 Telemedicine Milinda Pointer, [facility]  12/20/18 Procedure visit Milinda Pointer, MD [facility]  Showing recent visits within past 90 days and meeting all other requirements   Today's Visits Date Type Provider Dept  03/19/19 Procedure visit Milinda Pointer, MD [facility]  Showing today's visits and meeting all other requirements   Future Appointments Date Type Provider Dept  04/02/19 Appointment Milinda Pointer, MD [facility]  Showing future appointments within next 90 days and meeting all other requirements   Disposition: Discharge home  Discharge (Date  Time): 03/19/2019; 1049 hrs.   Primary Care Physician: Guadalupe Maple, MD Location: [facility]  Outpatient Pain Management Facility Note by: Gaspar Cola, MD Date: 03/19/2019; Time: 1:01 PM  Disclaimer:  Medicine is not an Chief Strategy Officer. The only guarantee in medicine is that nothing is guaranteed. It is important to note that the decision to proceed with this intervention was based on the information collected from the patient. The Data and conclusions were drawn from the patient's questionnaire, the interview, and the physical examination. Because the information was provided in large part by the patient, it cannot be guaranteed that it has not been purposely or unconsciously manipulated. Every effort has been made to obtain as much relevant data as possible for this evaluation. It is important to note that the conclusions that lead to this procedure are derived in large part from the available data. Always take into account that the treatment will also be dependent on availability of resources and existing treatment guidelines, considered by other Pain Management Practitioners as being common knowledge and practice, at the time of the intervention. For Medico-Legal purposes, it is also important to point out that variation in procedural techniques and pharmacological choices are the acceptable norm. The indications, contraindications, technique, and results of the above procedure should only be interpreted and judged by a Board-Certified Interventional Pain Specialist with extensive familiarity and expertise in the same exact procedure and technique.

## 2019-03-20 ENCOUNTER — Telehealth: Payer: Self-pay | Admitting: *Deleted

## 2019-03-20 NOTE — Telephone Encounter (Signed)
Voicemail left with patient re; procedure on yesterday to call our office if there are any questions or concerns.

## 2019-03-22 NOTE — Chronic Care Management (AMB) (Signed)
  Care Management   Outreach Note  03/22/2019 Name: Carolyn Roy MRN: [id number] DOB: September 13, 1965  Referred by: Marnee Guarneri NP Reason for referral :  Care Management (CM Initial outreach unsuccessful), Chronic Care Management (2nd Initial outreach unsuccesful), and Chronic Care Management (3rd Initial outreach unsuccessful)   Third unsuccessful telephone outreach was attempted today. The patient was referred to the case management team for assistance with care management and care coordination. The patient's primary care provider has been notified of our unsuccessful attempts to make or maintain contact with the patient. The care management team is pleased to engage with this patient at any time in the future should he/she be interested in assistance from the care management team.   Follow Up Plan: A HIPPA compliant phone message was left for the patient providing contact information and requesting a return call.  The care management team is available to follow up with the patient after provider conversation with the patient regarding recommendation for care management engagement and subsequent re-referral to the care management team.   Glenna Durand, LPN Health Advisor, [facility] ??[email or web address] ??[phone]

## 2019-03-29 ENCOUNTER — Inpatient Hospital Stay: Payer: No Typology Code available for payment source | Attending: Nurse Practitioner | Admitting: Nurse Practitioner

## 2019-03-29 ENCOUNTER — Other Ambulatory Visit: Payer: Self-pay

## 2019-03-29 DIAGNOSIS — F32A Depression, unspecified: Secondary | ICD-10-CM

## 2019-03-29 DIAGNOSIS — F329 Major depressive disorder, single episode, unspecified: Secondary | ICD-10-CM

## 2019-03-29 NOTE — Progress Notes (Signed)
Symptom Management [place]  [phone] [phone] Virtual Visit Progress Note  I connected with [name] on 03/29/19 at 10:30 AM EST by video enabled telemedicine visit and verified that I am speaking with the correct person using two identifiers.   I discussed the limitations, risks, security and privacy concerns of performing an evaluation and management service by telemedicine and the availability of in-person appointments. I also discussed with the patient that there may be a patient responsible charge related to this service. The patient expressed understanding and agreed to proceed.   Other persons participating in the visit and their role in the encounter: none  Patient's location: car Provider's location: clinic   Chief Complaint:   Patient Care Team: Guadalupe Maple, MD as PCP - General (Family Medicine) Bary Castilla, [name], MD (General Surgery) [name], [name], [name] (Obstetrics and Gynecology) Cammie Sickle, MD as Consulting Physician (Oncology)   Name of the patient: Carolyn Roy  [id number]  Apr 19, 1965   Date of visit: 03/29/19  Diagnosis- breast cancer  Chief complaint/ Reason for visit- insomnia, anxiety, depression  Heme/Onc history:  Oncology History Overview Note  # MAY 2019-  clinical stage IIIA (T3N1Mx) left breast cancer s/p biopsy on 06/14/2017. -Pathology revealed grade III invasive ductal carcinoma. -Axillary FNA revealed malignant cells c/w metastatic carcinoma. Tumor was ER + (90%), PR + (30%), Her2/neu - and Ki67 70%.  CA27.29 was 7.8 on 06/14/2017.  # She received 4 cycles of AC with Neulasta support (07/20/2017 - 08/31/2017).;  neoadjuvant Taxol on 09/14/2017.  #DEC 2019- Lumpectomy/sentinel lymph node biopsy [Dr.[name]]-complete pathologic response  # s/p RT [delayed sec to wound infection; Dr.[name]]  finished RT [4/12]  # April 14th 2020- START TAM; stopped in mid-May secondary intolerance [severe migraines].  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 Secord/Berchuck]-negative for malignancy/ # DM-2- poorly controlled.   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   # PAP SMEAR- RECOMMENDED 2022- summer  -------------------------------------------  DIAGNOSIS: left breast cancer  STAGE:  III       ;GOALS: cure  CURRENT/MOST RECENT THERAPY Tam    Cancer of midline of breast, left ([place])  06/15/2017 Initial Diagnosis   Cancer of midline of breast, left ([place])   06/26/2017 -  Chemotherapy   The patient had DOXOrubicin (ADRIAMYCIN) chemo injection 122 mg, 60 mg/m2 = 122 mg, Intravenous,  Once, 4 of 4 cycles Administration: 122 mg (07/20/2017), 122 mg (08/03/2017), 122 mg (08/17/2017), 122 mg (08/31/2017) palonosetron (ALOXI) injection 0.25 mg, 0.25 mg, Intravenous,  Once, 4 of 4 cycles Administration: 0.25 mg (07/20/2017), 0.25 mg (08/03/2017), 0.25 mg (08/17/2017), 0.25 mg (08/31/2017) pegfilgrastim (NEULASTA) injection 6 mg, 6 mg, Subcutaneous, Once, 5 of 5 cycles Administration: 6 mg (07/21/2017), 6 mg (08/04/2017), 6 mg (08/18/2017), 6 mg (09/01/2017) cyclophosphamide (CYTOXAN) 1,220 mg in sodium chloride 0.9 % 250 mL chemo infusion, 600 mg/m2 = 1,220 mg, Intravenous,  Once, 4 of 4 cycles Administration: 1,220 mg (07/20/2017), 1,220 mg (08/03/2017), 1,220 mg (08/17/2017), 1,220 mg (08/31/2017) PACLitaxel (TAXOL) 162 mg in sodium chloride 0.9 % 250 mL chemo infusion (</= 79m/m2), 80 mg/m2 = 162 mg, Intravenous,  Once, 10 of 12 cycles Dose modification: 65 mg/m2 (original dose 80 mg/m2, Cycle 13, Reason: Provider Judgment, Comment: neuropathy) Administration: 162 mg (09/14/2017),  162 mg (09/21/2017), 162 mg (09/28/2017), 162 mg (10/05/2017),  162 mg (10/12/2017), 162 mg (10/19/2017), 162 mg (10/26/2017), 132 mg (11/02/2017), 132 mg (11/09/2017), 132 mg (11/16/2017) fosaprepitant (EMEND) 150 mg, dexamethasone (DECADRON) 12 mg in sodium chloride 0.9 % 145 mL IVPB, , Intravenous,  Once, 4 of 4 cycles Administration:  (07/20/2017),  (08/03/2017),  (08/17/2017),  (08/31/2017)  for chemotherapy treatment.    Malignant neoplasm of lower-outer quadrant of left breast of female, estrogen receptor positive ([place])  11/08/2017 Initial Diagnosis   Malignant neoplasm of lower-outer quadrant of left breast of female, estrogen receptor positive ([place])     Interval history- Carolyn Roy, 54 year old female with above history of breast cancer, who presents to [facility] for follow up for history of depression,anxiety, and hallucinations. She last saw Billey Chang, NP on 03/01/19. At that time she was experiencing worsening depression associated with grief and loss. She had scheduled appointment with Dr. Nicolasa Ducking with psychiatry and appointment with Marnee Guarneri, NP to establish primary care. In the interim, she has seen both and will be establishing care with therapist as well. She continues celexa and trazodone and says she feels well today. She continues to have grief but feels that feels of rage and anxiety are significantly improved. Continues to have hallucinations but feels these are better controlled. Sleep is stable. No feelings of self harm or harm to others. Says she is tolerating medications well but is hoping to decrease or change medications in the future and she will discuss with Dr. Nicolasa Ducking. She is starting to see a counselor as well.   ECOG FS:1 - Symptomatic but completely ambulatory  Review of systems- Review of Systems  Constitutional: Negative for chills, fever, malaise/fatigue and weight loss.  HENT: Negative for hearing loss, nosebleeds, sore throat and tinnitus.   Eyes: Negative for blurred vision and double vision.    Respiratory: Negative for cough, hemoptysis, shortness of breath and wheezing.   Cardiovascular: Negative for chest pain, palpitations and leg swelling.  Gastrointestinal: Negative for abdominal pain, blood in stool, constipation, diarrhea, melena, nausea and vomiting.  Genitourinary: Negative for dysuria and urgency.  Musculoskeletal: Negative for back pain, falls, joint pain and myalgias.  Skin: Negative for itching and rash.  Neurological: Negative for dizziness, tingling, sensory change, loss of consciousness, weakness and headaches.  Endo/Heme/Allergies: Negative for environmental allergies. Does not bruise/bleed easily.  Psychiatric/Behavioral: Positive for hallucinations. Negative for depression, substance abuse and suicidal ideas. The patient is not nervous/anxious and does not have insomnia.        Mood swings- improved    Current treatment- anastrazole + zoladex  Allergies  Allergen Reactions  . Aspirin Nausea And Vomiting    Past Medical History:  Diagnosis Date  . Breast cancer ([place])   . Cancer ([place]) 06/15/2017   5.1 cm, T3,N1 (clinical): ER/ PR positive, Her 2 neu not overexpressed, High Ki 67. Neuoadjuvant chemotherapy.   . Depression   . Diabetes mellitus without complication ([place]) 7591  . Endometriosis   . Family history of breast cancer   . Headache    migraines  . Hyperlipidemia   . Ovarian mass   . Personal history of chemotherapy   . Personal history of radiation therapy   . Pneumonia    2018    Past Surgical History:  Procedure Laterality Date  . AXILLARY LYMPH NODE BIOPSY Left 07/14/2017   Procedure: INSERTION GEL MARK CLIP LEFT AXILLA;  Surgeon: Robert Bellow, MD;  Location: [facility] ORS;  Service: General;  Laterality: Left;  . BREAST BIOPSY Left    Dr Orlene Och BREAST METASTATIC CARCINOMA  . BREAST LUMPECTOMY Left 01/12/2018  . OOPHORECTOMY    . PARTIAL MASTECTOMY WITH NEEDLE LOCALIZATION Left 01/12/2018   Procedure: PARTIAL MASTECTOMY WITH  NEEDLE LOCALIZATION;  Surgeon: Robert Bellow, MD;  Location: [facility] ORS;  Service: General;  Laterality: Left;  . PORTACATH PLACEMENT Right 07/14/2017   Procedure: INSERTION PORT-A-CATH;  Surgeon: Robert Bellow, MD;  Location: [facility] ORS;  Service: General;  Laterality: Right;  . SENTINEL NODE BIOPSY Left 01/12/2018   Procedure: SENTINEL NODE BIOPSY;  Surgeon: Robert Bellow, MD;  Location: [facility] ORS;  Service: General;  Laterality: Left;  . TUBAL LIGATION      Social History   Socioeconomic History  . Marital status: Single    Spouse name: Not on file  . Number of children: Not on file  . Years of education: Not on file  . Highest education level: Not on file  Occupational History  . Not on file  Tobacco Use  . Smoking status: Current Every Day Smoker    Packs/day: 0.50    Years: 11.00    Pack years: 5.50    Types: Cigarettes  . Smokeless tobacco: Former Systems developer    Types: Snuff  Substance and Sexual Activity  . Alcohol use: No    Alcohol/week: 0.0 standard drinks  . Drug use: No  . Sexual activity: Yes  Other Topics Concern  . Not on file  Social History Narrative  . Not on file   Social Determinants of Health   Financial Resource Strain:   . Difficulty of Paying Living Expenses: Not on file  Food Insecurity:   . Worried About Charity fundraiser in the Last Year: Not on file  . Ran Out of Food in the Last Year: Not on file  Transportation Needs:   . Lack of Transportation (Medical): Not on file  . Lack of Transportation (Non-Medical): Not on file  Physical Activity:   . Days of Exercise per Week: Not on file  . Minutes of Exercise per Session: Not on file  Stress:   . Feeling of Stress : Not on file  Social Connections:   . Frequency of Communication with Friends and Family: Not on file  . Frequency of Social Gatherings with Friends and Family: Not on file  . Attends Religious Services: Not on file  . Active Member of Clubs or Organizations: Not on file  .  Attends Archivist Meetings: Not on file  . Marital Status: Not on file  Intimate Partner Violence:   . Fear of Current or Ex-Partner: Not on file  . Emotionally Abused: Not on file  . Physically Abused: Not on file  . Sexually Abused: Not on file    Family History  Problem Relation Age of Onset  . Other Father        No info about father or paternal relatives  . Diabetes Brother   . Pancreatitis Brother   . Prostate cancer Brother 49       currently 86 / maternal half-brother  . Breast cancer Maternal Grandmother 40       deceased 12s  . Breast cancer Maternal Aunt 65       currently 36  . Breast cancer Other 31       mother's sister; deceased 92  . Breast cancer Other        mother's sister; age at dx  unknown     Current Outpatient Medications:  .  albuterol (PROVENTIL HFA;VENTOLIN HFA) 108 (90 Base) MCG/ACT inhaler, Inhale 2 puffs into the lungs every 6 (six) hours as needed for wheezing or shortness of breath., Disp: 1 Inhaler, Rfl: 0 .  anastrozole (ARIMIDEX) 1 MG tablet, Take 1 tablet (1 mg total) by mouth daily., Disp: 90 tablet, Rfl: 3 .  aspirin-acetaminophen-caffeine (EXCEDRIN MIGRAINE) 250-250-65 MG tablet, Take 2 tablets by mouth daily as needed for headache., Disp: , Rfl:  .  canagliflozin (INVOKANA) 300 MG TABS tablet, Take 1 tablet (300 mg total) by mouth daily., Disp: 90 tablet, Rfl: 3 .  citalopram (CELEXA) 20 MG tablet, TAKE 1 TABLET (20 MG TOTAL) BY MOUTH DAILY. TAKE IN AM, Disp: 90 tablet, Rfl: 1 .  gabapentin (NEURONTIN) 300 MG capsule, Take 3 capsules (900 mg total) by mouth 4 (four) times daily. (Patient not taking: Reported on 03/19/2019), Disp: 360 capsule, Rfl: 5 .  glucose blood (ONE TOUCH ULTRA TEST) test strip, Use up to 4 times/day, Disp: 100 each, Rfl: 12 .  goserelin (ZOLADEX) 3.6 MG injection, Inject 3.6 mg into the skin every 28 (twenty-eight) days., Disp: , Rfl:  .  Insulin Glargine (BASAGLAR KWIKPEN) 100 UNIT/ML SOPN, Inject 0.1 mLs (10  Units total) into the skin at bedtime., Disp: 15 pen, Rfl: 2 .  lidocaine-prilocaine (EMLA) cream, Apply to affected area once, Disp: 30 g, Rfl: 3 .  ondansetron (ZOFRAN) 8 MG tablet, Take 1 tablet (8 mg total) by mouth 2 (two) times daily as needed. Start on the third day after chemotherapy., Disp: 30 tablet, Rfl: 1 .  oxyCODONE (OXY IR/ROXICODONE) 5 MG immediate release tablet, Take 1 tablet (5 mg total) by mouth 2 (two) times daily as needed for severe pain. Must last 30 days, Disp: 60 tablet, Rfl: 0 .  [START ON 04/22/2019] oxyCODONE (OXY IR/ROXICODONE) 5 MG immediate release tablet, Take 1 tablet (5 mg total) by mouth 2 (two) times daily as needed for severe pain. Must last 30 days, Disp: 60 tablet, Rfl: 0 .  [START ON 05/22/2019] oxyCODONE (OXY IR/ROXICODONE) 5 MG immediate release tablet, Take 1 tablet (5 mg total) by mouth 2 (two) times daily as needed for severe pain. Must last 30 days, Disp: 60 tablet, Rfl: 0 .  pregabalin (LYRICA) 50 MG capsule, Take 1 capsule (50 mg total) by mouth 3 (three) times daily., Disp: 90 capsule, Rfl: 0 .  prochlorperazine (COMPAZINE) 10 MG tablet, Take 1 tablet (10 mg total) by mouth every 6 (six) hours as needed (Nausea or vomiting)., Disp: 30 tablet, Rfl: 1 .  traZODone (DESYREL) 50 MG tablet, TAKE 1-2 TABLETS (50-100 MG TOTAL) BY MOUTH AT BEDTIME AS NEEDED FOR SLEEP., Disp: 180 tablet, Rfl: 1 No current facility-administered medications for this visit.  Facility-Administered Medications Ordered in Other Visits:  .  heparin lock flush 100 unit/mL, 500 Units, Intravenous, Once, Corcoran, Melissa C, MD .  sodium chloride flush (NS) 0.9 % injection 10 mL, 10 mL, Intravenous, Once, Lequita Asal, MD  Physical exam:  Exam limited due to telemedicine  CMP Latest Ref Rng & Units 03/06/2019  Glucose 65 - 99 mg/dL 320(H)  BUN 6 - 24 mg/dL 10  Creatinine 0.57 - 1.00 mg/dL 0.61  Sodium 134 - 144 mmol/L 138  Potassium 3.5 - 5.2 mmol/L 3.9  Chloride 96 - 106  mmol/L 100  CO2 20 - 29 mmol/L 24  Calcium 8.7 - 10.2 mg/dL 9.6  Total Protein 6.0 - 8.5 g/dL  6.2  Total Bilirubin 0.0 - 1.2 mg/dL 0.3  Alkaline Phos 39 - 117 IU/L 104  AST 0 - 40 IU/L 11  ALT 0 - 32 IU/L 12   CBC Latest Ref Rng & Units 01/18/2019  WBC 4.0 - 10.5 K/uL 6.8  Hemoglobin 12.0 - 15.0 g/dL 14.1  Hematocrit 36.0 - 46.0 % 43.4  Platelets 150 - 400 K/uL 200   No images are attached to the encounter.  No results found.  Assessment and plan- Patient is a 54 y.o. female diagnosed with stage III left breast cancer status post neoadjuvant chemotherapy, lumpectomy, radiation currently on anastrozole and Zoladex, who presents to symptom management clinic for follow-up for depression and hallucinations.   1.  Depression and hallucinations-on Celexa and trazodone. Now followed by psychiatry and counseling. Applauded her for following up on referrals. Overall she feels better and I appreciate management by psychiatry.   She has established pcp care with Marnee Guarneri for ongoing management of her chronic medical conditions.   Disposition:  Continue follow up with psychiatry. No new appointments with Symptom Management. Return to clinic if new as needed.   I discussed the assessment and treatment plan with the patient. The patient was provided an opportunity to ask questions and all were answered. The patient agreed with the plan and demonstrated an understanding of the instructions.   The patient was advised to call back or seek an in-person evaluation if the symptoms worsen or if the condition fails to improve as anticipated.   I provided 10 minutes of face-to-face video visit time during this encounter, and > 50% was spent counseling as documented under my assessment & plan.   Beckey Rutter, DNP, AGNP-C [place] at [facility]  Visit Diagnosis 1. Depression, unspecified depression type    Patient expressed understanding and was in agreement with this plan. She also  understands that She can call clinic at any time with any questions, concerns, or complaints.   Thank you for allowing me to participate in the care of this pleasant patient.   Beckey Rutter, DNP, AGNP-C [place] at [facility] [phone] (clinic)  CC: Dr. Rogue Bussing

## 2019-04-02 ENCOUNTER — Other Ambulatory Visit: Payer: Self-pay

## 2019-04-02 ENCOUNTER — Encounter: Payer: Self-pay | Admitting: Pain Medicine

## 2019-04-02 ENCOUNTER — Ambulatory Visit: Payer: No Typology Code available for payment source | Attending: Pain Medicine | Admitting: Pain Medicine

## 2019-04-02 VITALS — BP 98/78 | HR 87 | Temp 97.5°F | Resp 18 | Ht 65.0 in | Wt 177.0 lb

## 2019-04-02 DIAGNOSIS — M25562 Pain in left knee: Secondary | ICD-10-CM | POA: Insufficient documentation

## 2019-04-02 DIAGNOSIS — G8929 Other chronic pain: Secondary | ICD-10-CM | POA: Diagnosis present

## 2019-04-02 DIAGNOSIS — M792 Neuralgia and neuritis, unspecified: Secondary | ICD-10-CM

## 2019-04-02 DIAGNOSIS — M25561 Pain in right knee: Secondary | ICD-10-CM | POA: Diagnosis present

## 2019-04-02 DIAGNOSIS — G5793 Unspecified mononeuropathy of bilateral lower limbs: Secondary | ICD-10-CM

## 2019-04-02 DIAGNOSIS — M1711 Unilateral primary osteoarthritis, right knee: Secondary | ICD-10-CM

## 2019-04-02 MED ORDER — ROPIVACAINE HCL 2 MG/ML IJ SOLN
5.0000 mL | Freq: Once | INTRAMUSCULAR | Status: AC
  Administered 2019-04-02: 5 mL via INTRA_ARTICULAR
  Filled 2019-04-02: qty 10

## 2019-04-02 MED ORDER — SODIUM HYALURONATE (VISCOSUP) 20 MG/2ML IX SOSY
2.0000 mL | PREFILLED_SYRINGE | Freq: Once | INTRA_ARTICULAR | Status: AC
  Administered 2019-04-02: 2 mL via INTRA_ARTICULAR

## 2019-04-02 MED ORDER — LIDOCAINE HCL (PF) 1 % IJ SOLN
5.0000 mL | Freq: Once | INTRAMUSCULAR | Status: AC
  Administered 2019-04-02: 5 mL
  Filled 2019-04-02: qty 5

## 2019-04-02 MED ORDER — PREGABALIN 75 MG PO CAPS: 75.0000 mg | ORAL_CAPSULE | Freq: Three times a day (TID) | ORAL | 2 refills | Status: DC

## 2019-04-02 NOTE — Patient Instructions (Addendum)
____________________________________________________________________________________________  Post-Procedure Discharge Instructions  Instructions:  Apply ice:   Purpose: This will minimize any swelling and discomfort after procedure.   When: Day of procedure, as soon as you get home.  How: Fill a plastic sandwich bag with crushed ice. Cover it with a small towel and apply to injection site.  How long: (15 min on, 15 min off) Apply for 15 minutes then remove x 15 minutes.  Repeat sequence on day of procedure, until you go to bed.  Apply heat:   Purpose: To treat any soreness and discomfort from the procedure.  When: Starting the next day after the procedure.  How: Apply heat to procedure site starting the day following the procedure.  How long: May continue to repeat daily, until discomfort goes away.  Food intake: Start with clear liquids (like water) and advance to regular food, as tolerated.   Physical activities: Keep activities to a minimum for the first 8 hours after the procedure. After that, then as tolerated.  Driving: If you have received any sedation, be responsible and do not drive. You are not allowed to drive for 24 hours after having sedation.  Blood thinner: (Applies only to those taking blood thinners) You may restart your blood thinner 6 hours after your procedure.  Insulin: (Applies only to Diabetic patients taking insulin) As soon as you can eat, you may resume your normal dosing schedule.  Infection prevention: Keep procedure site clean and dry. Shower daily and clean area with soap and water.  Post-procedure Pain Diary: Extremely important that this be done correctly and accurately. Recorded information will be used to determine the next step in treatment. For the purpose of accuracy, follow these rules:  Evaluate only the area treated. Do not report or include pain from an untreated area. For the purpose of this evaluation, ignore all other areas of pain,  except for the treated area.  After your procedure, avoid taking a long nap and attempting to complete the pain diary after you wake up. Instead, set your alarm clock to go off every hour, on the hour, for the initial 8 hours after the procedure. Document the duration of the numbing medicine, and the relief you are getting from it.  Do not go to sleep and attempt to complete it later. It will not be accurate. If you received sedation, it is likely that you were given a medication that may cause amnesia. Because of this, completing the diary at a later time may cause the information to be inaccurate. This information is needed to plan your care.  Follow-up appointment: Keep your post-procedure follow-up evaluation appointment after the procedure (usually 2 weeks for most procedures, 6 weeks for radiofrequencies). DO NOT FORGET to bring you pain diary with you.   Expect: (What should I expect to see with my procedure?)  From numbing medicine (AKA: Local Anesthetics): Numbness or decrease in pain. You may also experience some weakness, which if present, could last for the duration of the local anesthetic.  Onset: Full effect within 15 minutes of injected.  Duration: It will depend on the type of local anesthetic used. On the average, 1 to 8 hours.   From steroids (Applies only if steroids were used): Decrease in swelling or inflammation. Once inflammation is improved, relief of the pain will follow.  Onset of benefits: Depends on the amount of swelling present. The more swelling, the longer it will take for the benefits to be seen. In some cases, up to 10 days.    Duration: Steroids will stay in the system x 2 weeks. Duration of benefits will depend on multiple posibilities including persistent irritating factors.  Side-effects: If present, they may typically last 2 weeks (the duration of the steroids).  Frequent: Cramps (if they occur, drink Gatorade and take over-the-counter Magnesium 450-500 mg  once to twice a day); water retention with temporary weight gain; increases in blood sugar; decreased immune system response; increased appetite.  Occasional: Facial flushing (red, warm cheeks); mood swings; menstrual changes.  Uncommon: Long-term decrease or suppression of natural hormones; bone thinning. (These are more common with higher doses or more frequent use. This is why we prefer that our patients avoid having any injection therapies in other practices.)   Very Rare: Severe mood changes; psychosis; aseptic necrosis.  From procedure: Some discomfort is to be expected once the numbing medicine wears off. This should be minimal if ice and heat are applied as instructed.  Call if: (When should I call?)  You experience numbness and weakness that gets worse with time, as opposed to wearing off.  New onset bowel or bladder incontinence. (Applies only to procedures done in the spine)  Emergency Numbers:  Durning business hours (Monday - Thursday, 8:00 AM - 4:00 PM) (Friday, 9:00 AM - 12:00 Noon): [phone]  After hours: [phone]  NOTE: If you are having a problem and are unable connect with, or to talk to a provider, then go to your nearest urgent care or emergency department. If the problem is serious and urgent, please call 911. ____________________________________________________________________________________________   ____________________________________________________________________________________________  Preparing for your procedure (without sedation)  Procedure appointments are limited to planned procedures: . No Prescription Refills. . No disability issues will be discussed. . No medication changes will be discussed.  Instructions: . Oral Intake: Do not eat or drink anything for at least 3 hours prior to your procedure. . Transportation: Unless otherwise stated by your physician, you may drive yourself after the procedure. . Blood Pressure Medicine:  Take your blood pressure medicine with a sip of water the morning of the procedure. . Blood thinners: Notify our staff if you are taking any blood thinners. Depending on which one you take, there will be specific instructions on how and when to stop it. . Diabetics on insulin: Notify the staff so that you can be scheduled 1st case in the morning. If your diabetes requires high dose insulin, take only  of your normal insulin dose the morning of the procedure and notify the staff that you have done so. . Preventing infections: Shower with an antibacterial soap the morning of your procedure.  . Build-up your immune system: Take 1000 mg of Vitamin C with every meal (3 times a day) the day prior to your procedure. . Antibiotics: Inform the staff if you have a condition or reason that requires you to take antibiotics before dental procedures. . Pregnancy: If you are pregnant, call and cancel the procedure. . Sickness: If you have a cold, fever, or any active infections, call and cancel the procedure. . Arrival: You must be in the facility at least 30 minutes prior to your scheduled procedure. . Children: Do not bring any children with you. . Dress appropriately: Bring dark clothing that you would not mind if they get stained. . Valuables: Do not bring any jewelry or valuables.  Reasons to call and reschedule or cancel your procedure: (Following these recommendations will minimize the risk of a serious complication.) . Surgeries: Avoid having procedures within 2 weeks   of any surgery. (Avoid for 2 weeks before or after any surgery). . Flu Shots: Avoid having procedures within 2 weeks of a flu shots or . (Avoid for 2 weeks before or after immunizations). . Barium: Avoid having a procedure within 7-10 days after having had a radiological study involving the use of radiological contrast. (Myelograms, Barium swallow or enema study). . Heart attacks: Avoid any elective procedures or surgeries for the initial 6  months after a "Myocardial Infarction" (Heart Attack). . Blood thinners: It is imperative that you stop these medications before procedures. Let us know if you if you take any blood thinner.  . Infection: Avoid procedures during or within two weeks of an infection (including chest colds or gastrointestinal problems). Symptoms associated with infections include: Localized redness, fever, chills, night sweats or profuse sweating, burning sensation when voiding, cough, congestion, stuffiness, runny nose, sore throat, diarrhea, nausea, vomiting, cold or Flu symptoms, recent or current infections. It is specially important if the infection is over the area that we intend to treat. [name] Heart and lung problems: Symptoms that may suggest an active cardiopulmonary problem include: cough, chest pain, breathing difficulties or shortness of breath, dizziness, ankle swelling, uncontrolled high or unusually low blood pressure, and/or palpitations. If you are experiencing any of these symptoms, cancel your procedure and contact your primary care physician for an evaluation.  Remember:  Regular Business hours are:  Monday to Thursday 8:00 AM to 4:00 PM  Provider's Schedule: Milinda Pointer, MD:  Procedure days: Tuesday and Thursday 7:30 AM to 4:00 PM  Gillis Santa, MD:  Procedure days: Monday and Wednesday 7:30 AM to 4:00 PM ____________________________________________________________________________________________   Pain Management Discharge Instructions  General Discharge Instructions :  If you need to reach your doctor call: Monday-Friday 8:00 am - 4:00 pm at [phone] or toll free [phone].  After clinic hours [phone] to have operator reach doctor.  Bring all of your medication bottles to all your appointments in the pain clinic.  To cancel or reschedule your appointment with Pain Management please remember to call 24 hours in advance to avoid a fee.  Refer to the educational materials  which you have been given on: General Risks, I had my Procedure. Discharge Instructions, Post Sedation.  Post Procedure Instructions:  The drugs you were given will stay in your system until tomorrow, so for the next 24 hours you should not drive, make any legal decisions or drink any alcoholic beverages.  You may eat anything you prefer, but it is better to start with liquids then soups and crackers, and gradually work up to solid foods.  Please notify your doctor immediately if you have any unusual bleeding, trouble breathing or pain that is not related to your normal pain.  Depending on the type of procedure that was done, some parts of your body may feel week and/or numb.  This usually clears up by tonight or the next day.  Walk with the use of an assistive device or accompanied by an adult for the 24 hours.  You may use ice on the affected area for the first 24 hours.  Put ice in a Ziploc bag and cover with a towel and place against area 15 minutes on 15 minutes off.  You may switch to heat after 24 hours.

## 2019-04-02 NOTE — Progress Notes (Signed)
Safety precautions to be maintained throughout the outpatient stay will include: orient to surroundings, keep bed in low position, maintain call bell within reach at all times, provide assistance with transfer out of bed and ambulation.  

## 2019-04-02 NOTE — Progress Notes (Signed)
PROVIDER NOTE: Information contained herein reflects review and annotations entered in association with encounter. Interpretation of such information and data should be left to medically-trained personnel. Information provided to patient can be located elsewhere in the medical record under "Patient Instructions". Document created using STT-dictation technology, any transcriptional errors that may result from process are unintentional.    Patient: Carolyn Roy  Service Category: Procedure  Provider: Gaspar Cola, MD  DOB: 03-18-1965  DOS: 04/02/2019  Location: [facility]  MRN: [id number]  Setting: Ambulatory - outpatient  Referring Provider: Guadalupe Maple, MD  Type: Established Patient  Specialty: Interventional Pain Management  PCP: Guadalupe Maple, MD   Primary Reason for Visit: Interventional Pain Management Treatment. CC: Knee Pain (right)  Procedure:          Anesthesia, Analgesia, Anxiolysis:  Type: Therapeutic Intra-Articular Hyalgan Knee Injection #2  Region: Lateral infrapatellar Knee Region Level: Knee Joint Laterality: Right knee  Type: Local Anesthesia Indication(s): Analgesia         Local Anesthetic: Lidocaine 1-2% Route: Infiltration ([place]/IM) IV Access: Declined Sedation: Declined   Position: Sitting   Indications: 1. Osteoarthritis of knee (Right)   2. Chronic knee pain (Secondary Area of Pain) (Bilateral) (R>L)   3. Neuropathic pain   4. Neuropathic pain of feet (Bilateral)    Pain Score: Pre-procedure: 2 /10 Post-procedure: 2 /10   Pre-op Assessment:  Carolyn Roy is a 54 y.o. (year old), female patient, seen today for interventional treatment. She  has a past surgical history that includes Oophorectomy; Tubal ligation; Portacath placement (Right, 07/14/2017); Axillary lymph node biopsy (Left, 07/14/2017); Breast lumpectomy (Left, 01/12/2018); Breast biopsy (Left); Partial mastectomy with needle localization (Left, 01/12/2018); and Sentinel  node biopsy (Left, 01/12/2018). Carolyn Roy has a current medication list which includes the following prescription(s): albuterol, anastrozole, aspirin-acetaminophen-caffeine, canagliflozin, citalopram, glucose blood, goserelin, basaglar kwikpen, lidocaine-prilocaine, ondansetron, oxycodone, [START ON 04/22/2019] oxycodone, [START ON 05/22/2019] oxycodone, pregabalin, prochlorperazine, trazodone, and gabapentin, and the following Facility-Administered Medications: heparin lock flush and sodium chloride flush. Her primarily concern today is the Knee Pain (right)  Initial Vital Signs:  Pulse/HCG Rate: 93  Temp: (!) 97.5 F (36.4 C) Resp: 18 BP: 115/83 SpO2: 97 %  BMI: Estimated body mass index is 29.45 kg/m as calculated from the following:   Height as of this encounter: 5\' 5"  (1.651 m).   Weight as of this encounter: 177 lb (80.3 kg).  Risk Assessment: Allergies: Reviewed. She is allergic to aspirin.  Allergy Precautions: None required Coagulopathies: Reviewed. None identified.  Blood-thinner therapy: None at this time Active Infection(s): Reviewed. None identified. Carolyn Roy is afebrile  Site Confirmation: Carolyn Roy was asked to confirm the procedure and laterality before marking the site Procedure checklist: Completed Consent: Before the procedure and under the influence of no sedative(s), amnesic(s), or anxiolytics, the patient was informed of the treatment options, risks and possible complications. To fulfill our ethical and legal obligations, as recommended by the American Medical Association's Code of Ethics, I have informed the patient of my clinical impression; the nature and purpose of the treatment or procedure; the risks, benefits, and possible complications of the intervention; the alternatives, including doing nothing; the risk(s) and benefit(s) of the alternative treatment(s) or procedure(s); and the risk(s) and benefit(s) of doing nothing. The patient was provided information about  the general risks and possible complications associated with the procedure. These may include, but are not limited to: failure to achieve desired goals, infection, bleeding, organ or nerve  damage, allergic reactions, paralysis, and death. In addition, the patient was informed of those risks and complications associated to the procedure, such as failure to decrease pain; infection; bleeding; organ or nerve damage with subsequent damage to sensory, motor, and/or autonomic systems, resulting in permanent pain, numbness, and/or weakness of one or several areas of the body; allergic reactions; (i.e.: anaphylactic reaction); and/or death. Furthermore, the patient was informed of those risks and complications associated with the medications. These include, but are not limited to: allergic reactions (i.e.: anaphylactic or anaphylactoid reaction(s)); adrenal axis suppression; blood sugar elevation that in diabetics may result in ketoacidosis or comma; water retention that in patients with history of congestive heart failure may result in shortness of breath, pulmonary edema, and decompensation with resultant heart failure; weight gain; swelling or edema; medication-induced neural toxicity; particulate matter embolism and blood vessel occlusion with resultant organ, and/or nervous system infarction; and/or aseptic necrosis of one or more joints. Finally, the patient was informed that Medicine is not an exact science; therefore, there is also the possibility of unforeseen or unpredictable risks and/or possible complications that may result in a catastrophic outcome. The patient indicated having understood very clearly. We have given the patient no guarantees and we have made no promises. Enough time was given to the patient to ask questions, all of which were answered to the patient's satisfaction. Carolyn Roy has indicated that she wanted to continue with the procedure. Attestation: I, the ordering provider, attest that I  have discussed with the patient the benefits, risks, side-effects, alternatives, likelihood of achieving goals, and potential problems during recovery for the procedure that I have provided informed consent. Date  Time: 04/02/2019 10:18 AM  Pre-Procedure Preparation:  Monitoring: As per clinic protocol. Respiration, ETCO2, SpO2, BP, heart rate and rhythm monitor placed and checked for adequate function Safety Precautions: Patient was assessed for positional comfort and pressure points before starting the procedure. Time-out: I initiated and conducted the "Time-out" before starting the procedure, as per protocol. The patient was asked to participate by confirming the accuracy of the "Time Out" information. Verification of the correct person, site, and procedure were performed and confirmed by me, the nursing staff, and the patient. "Time-out" conducted as per Joint Commission's Universal Protocol (UP.01.01.01). Time: 1042  Description of Procedure:          Target Area: Knee Joint Approach: Just above the Lateral tibial plateau, lateral to the infrapatellar tendon. Area Prepped: Entire knee area, from the mid-thigh to the mid-shin. Prepping solution: DuraPrep (Iodine Povacrylex [0.7% available iodine] and Isopropyl Alcohol, 74% w/w) Safety Precautions: Aspiration looking for blood return was conducted prior to all injections. At no point did we inject any substances, as a needle was being advanced. No attempts were made at seeking any paresthesias. Safe injection practices and needle disposal techniques used. Medications properly checked for expiration dates. SDV (single dose vial) medications used. Description of the Procedure: Protocol guidelines were followed. The patient was placed in position over the fluoroscopy table. The target area was identified and the area prepped in the usual manner. Skin & deeper tissues infiltrated with local anesthetic. Appropriate amount of time allowed to pass for  local anesthetics to take effect. The procedure needles were then advanced to the target area. Proper needle placement secured. Negative aspiration confirmed. Solution injected in intermittent fashion, asking for systemic symptoms every 0.5cc of injectate. The needles were then removed and the area cleansed, making sure to leave some of the prepping solution back to take  advantage of its long term bactericidal properties. Vitals:   04/02/19 1016 04/02/19 1019  BP: 115/83 98/78  Pulse: 93 87  Resp:  18  Temp: (!) 97.5 F (36.4 C)   SpO2: 97%   Weight: 177 lb (80.3 kg)   Height: 5\' 5"  (1.651 m)     Start Time: 1042 hrs. End Time: 1045 hrs. Materials:  Needle(s) Type: Regular needle Gauge: 25G Length: 1.5-in Medication(s): Please see orders for medications and dosing details.  Imaging Guidance:          Type of Imaging Technique: None used Indication(s): N/A Exposure Time: No patient exposure Contrast: None used. Fluoroscopic Guidance: N/A Ultrasound Guidance: N/A Interpretation: N/A  Antibiotic Prophylaxis:   Anti-infectives (From admission, onward)   None     Indication(s): None identified  Post-operative Assessment:  Post-procedure Vital Signs:  Pulse/HCG Rate: 87  Temp: (!) 97.5 F (36.4 C) Resp: 18 BP: 98/78 SpO2: 97 %  EBL: None  Complications: No immediate post-treatment complications observed by team, or reported by patient.  Note: The patient tolerated the entire procedure well. A repeat set of vitals were taken after the procedure and the patient was kept under observation following institutional policy, for this type of procedure. Post-procedural neurological assessment was performed, showing return to baseline, prior to discharge. The patient was provided with post-procedure discharge instructions, including a section on how to identify potential problems. Should any problems arise concerning this procedure, the patient was given instructions to immediately  contact us, at any time, without hesitation. In any case, we plan to contact the patient by telephone for a follow-up status report regarding this interventional procedure.  Comments:  No additional relevant information.  Plan of Care  Orders:  Orders Placed This Encounter  Procedures  . KNEE INJECTION    Hyalgan knee injection to be done by MD.    Scheduling Instructions:     Procedure: Intra-articular Hyalgan Knee injection #2     Side(s): Right Knee     Sedation: None     Timeframe: Today    Order Specific Question:   Where will this procedure be performed?    Answer:   [facility]  . KNEE INJECTION    Hyalgan knee injection. Please order Hyalgan.    Standing Status:   Future    Standing Expiration Date:   04/30/2019    Scheduling Instructions:     Procedure: Intra-articular Hyalgan Knee injection #3     Side: Right-sided     Sedation: None     Timeframe: in two (2) weeks    Order Specific Question:   Where will this procedure be performed?    Answer:   [facility]  . Informed Consent Details: Physician/Practitioner Attestation; Transcribe to consent form and obtain patient signature    Provider Attestation: I, Ethel Dossie Arbour, MD, (Pain Management Specialist), the physician/practitioner, attest that I have discussed with the patient the benefits, risks, side effects, alternatives, likelihood of achieving goals and potential problems during recovery for the procedure that I have provided informed consent.    Scheduling Instructions:     Procedure: Therapeutic, right sided, intra-articular Hyalgan knee injection     Indications: Chronic right knee pain secondary to primary osteoarthritis of the knee     Note: Always confirm laterality of pain with Ms. White, before procedure.     Transcribe to consent form and obtain patient signature.  . Provide equipment / supplies at bedside    Equipment required: Single  use, disposable, "Block Tray"    Standing Status:    Standing    Number of Occurrences:   1    Order Specific Question:   Specify    Answer:   Block Tray   Chronic Opioid Analgesic:  Oxycodone IR 5 mg, 1 tab PO BID (10 mg/day of oxycodone) (Average: 2 tabs/day = 10 mg/day of oxycodone) MME/day: 15 mg/day.   Medications ordered for procedure: Meds ordered this encounter  Medications  . lidocaine (PF) (XYLOCAINE) 1 % injection 5 mL  . ropivacaine (PF) 2 mg/mL (0.2%) (NAROPIN) injection 5 mL  . Sodium Hyaluronate SOSY 2 mL  . pregabalin (LYRICA) 75 MG capsule    Sig: Take 1 capsule (75 mg total) by mouth 3 (three) times daily.    Dispense:  90 capsule    Refill:  2    Fill one day early if pharmacy is closed on scheduled refill date. May substitute for generic if available.   Medications administered: We administered lidocaine (PF), ropivacaine (PF) 2 mg/mL (0.2%), and Sodium Hyaluronate.  See the medical record for exact dosing, route, and time of administration.  Follow-up plan:   Return for Procedure (no sedation): (R) Hyalgan #3.       Considering:   Possible bilateral lumbar facet RFA  Diagnostic lumbar sympathetic block  Diagnostic right knee Hyalgan series #1  Diagnostic right genicular NB  Possible right genicular nerve RFA    Palliative PRN treatment(s):   Therapeutic right IA knee joint injection #3 (steroids)  Diagnostic bilateral lumbar facet block #2       Recent Visits Date Type Provider Dept  03/19/19 Procedure visit Milinda Pointer, MD [facility]  03/13/19 Telemedicine Milinda Pointer, MD [facility]  Showing recent visits within past 90 days and meeting all other requirements   Today's Visits Date Type Provider Dept  04/02/19 Procedure visit Milinda Pointer, MD [facility]  Showing today's visits and meeting all other requirements   Future Appointments Date Type Provider Dept  04/16/19 Appointment Milinda Pointer, MD [facility]  06/19/19  Appointment Milinda Pointer, MD [facility]  Showing future appointments within next 90 days and meeting all other requirements   Disposition: Discharge home  Discharge (Date  Time): 04/02/2019; 1055 hrs.   Primary Care Physician: Guadalupe Maple, MD Location: [facility] Note by: Gaspar Cola, MD Date: 04/02/2019; Time: 10:59 AM  Disclaimer:  Medicine is not an Chief Strategy Officer. The only guarantee in medicine is that nothing is guaranteed. It is important to note that the decision to proceed with this intervention was based on the information collected from the patient. The Data and conclusions were drawn from the patient's questionnaire, the interview, and the physical examination. Because the information was provided in large part by the patient, it cannot be guaranteed that it has not been purposely or unconsciously manipulated. Every effort has been made to obtain as much relevant data as possible for this evaluation. It is important to note that the conclusions that lead to this procedure are derived in large part from the available data. Always take into account that the treatment will also be dependent on availability of resources and existing treatment guidelines, considered by other Pain Management Practitioners as being common knowledge and practice, at the time of the intervention. For Medico-Legal purposes, it is also important to point out that variation in procedural techniques and pharmacological choices are the acceptable norm. The indications, contraindications, technique, and  results of the above procedure should only be interpreted and judged by a Board-Certified Interventional Pain Specialist with extensive familiarity and expertise in the same exact procedure and technique.

## 2019-04-05 ENCOUNTER — Encounter: Payer: Self-pay | Admitting: Nurse Practitioner

## 2019-04-05 ENCOUNTER — Ambulatory Visit (INDEPENDENT_AMBULATORY_CARE_PROVIDER_SITE_OTHER): Payer: No Typology Code available for payment source | Admitting: Nurse Practitioner

## 2019-04-05 ENCOUNTER — Other Ambulatory Visit: Payer: Self-pay

## 2019-04-05 VITALS — BP 108/71 | HR 81 | Temp 98.6°F

## 2019-04-05 DIAGNOSIS — Z794 Long term (current) use of insulin: Secondary | ICD-10-CM

## 2019-04-05 DIAGNOSIS — E785 Hyperlipidemia, unspecified: Secondary | ICD-10-CM | POA: Diagnosis not present

## 2019-04-05 DIAGNOSIS — E1165 Type 2 diabetes mellitus with hyperglycemia: Secondary | ICD-10-CM

## 2019-04-05 DIAGNOSIS — E1169 Type 2 diabetes mellitus with other specified complication: Secondary | ICD-10-CM

## 2019-04-05 NOTE — Assessment & Plan Note (Signed)
Chronic, ongoing with recent A1C 10.8%.  Poor diet at home.  Previously followed by endo, but prefers not to return at this time. Continue Invokana and Basaglar at 10 units.  She would like to come off insulin in future, could try GLP.  Referral to CCM team for collaborative approach placed last visit and provided her phone number for team today at visit, she is to call them as on review they did attempt to call her.  Recommend continue to monitor BS at home BID and heavily focus on diet changes.  Return in 2 months for follow-up.

## 2019-04-05 NOTE — Progress Notes (Signed)
BP 108/71   Pulse 81   Temp 98.6 F (37 C) (Oral)   LMP  (LMP Unknown) Comment: LAST PERIOD IN MAY 2019 WHEN SHE STARTED CHEMO  SpO2 96%    Subjective:    Patient ID: Carolyn Roy, female    DOB: [birth year], 54 y.o.   MRN: [id number]  HPI: Carolyn Roy is a 54 y.o. female  Chief Complaint  Patient presents with  . Diabetes    4 week f.up   DIABETES Recent A1C 10.8%.  Was previously followed by endocrinology in past, last April 2020. Restarted Materials engineer.  Does endorse drinking a daily Pepsi and does enjoy snacking in afternoon, often not healthy snacks.  Has seen improvement in BS since starting back on medications.  LDL elevated on recent labs 140 and TCHOL 223, with trigs 184.  Has taken statins in past and poorly tolerated due to side effects.  Does not wish to restart at this time.  Recent labs not fasting.   Hypoglycemic episodes:no Polydipsia/polyuria: no Visual disturbance: no Chest pain: no Paresthesias: no Glucose Monitoring: yes  Accucheck frequency: Daily  Fasting glucose: 130-140  Post prandial:  Evening: 230 to 240  Before meals: Taking Insulin?: yes  Long acting insulin: Basaglar 10 units -- had been on 24 units a year ago  Short acting insulin: Blood Pressure Monitoring: not checking Retinal Examination: Not up to Date Foot Exam: Up to Date Pneumovax: Up to Date Influenza: refused Aspirin: no  Relevant past medical, surgical, family and social history reviewed and updated as indicated. Interim medical history since our last visit reviewed. Allergies and medications reviewed and updated.  Review of Systems  Constitutional: Negative for activity change, appetite change, diaphoresis, fatigue and fever.  Respiratory: Negative for cough, chest tightness and shortness of breath.   Cardiovascular: Negative for chest pain, palpitations and leg swelling.  Gastrointestinal: Negative.   Endocrine: Negative for polydipsia, polyphagia and  polyuria.  Neurological: Negative.   Psychiatric/Behavioral: Negative.     Per HPI unless specifically indicated above     Objective:    BP 108/71   Pulse 81   Temp 98.6 F (37 C) (Oral)   LMP  (LMP Unknown) Comment: LAST PERIOD IN MAY 2019 WHEN SHE STARTED CHEMO  SpO2 96%   Wt Readings from Last 3 Encounters:  04/02/19 177 lb (80.3 kg)  03/19/19 177 lb (80.3 kg)  03/06/19 177 lb (80.3 kg)    Physical Exam Vitals and nursing note reviewed.  Constitutional:      General: She is awake.     Appearance: She is well-developed and well-groomed.  HENT:     Head: Normocephalic.     Right Ear: Hearing normal.     Left Ear: Hearing normal.  Eyes:     General: Lids are normal.        Right eye: No discharge.        Left eye: No discharge.     Conjunctiva/sclera: Conjunctivae normal.     Pupils: Pupils are equal, round, and reactive to light.  Neck:     Thyroid: No thyromegaly.     Vascular: No carotid bruit.  Cardiovascular:     Rate and Rhythm: Normal rate and regular rhythm.     Heart sounds: Normal heart sounds. No murmur. No gallop.   Pulmonary:     Effort: Pulmonary effort is normal. No accessory muscle usage or respiratory distress.     Breath sounds: Normal breath sounds.  Abdominal:  General: Bowel sounds are normal.     Palpations: Abdomen is soft.  Musculoskeletal:     Cervical back: Normal range of motion and neck supple.     Right lower leg: No edema.     Left lower leg: No edema.  Skin:    General: Skin is warm and dry.  Neurological:     Mental Status: She is alert and oriented to person, place, and time.  Psychiatric:        Attention and Perception: Attention normal.        Mood and Affect: Mood normal.        Behavior: Behavior normal. Behavior is cooperative.        Thought Content: Thought content normal.        Judgment: Judgment normal.     Results for orders placed or performed in visit on 03/06/19  Bayer DCA Hb A1c Waived  Result Value  Ref Range   HB A1C (BAYER DCA - WAIVED) 10.8 (H) <7.0 %  Microalbumin, Urine Waived  Result Value Ref Range   Microalb, Ur Waived 10 0 - 19 mg/L   Creatinine, Urine Waived 10 10 - 300 mg/dL   Microalb/Creat Ratio <30 <30 mg/g  Comprehensive metabolic panel  Result Value Ref Range   Glucose 320 (H) 65 - 99 mg/dL   BUN 10 6 - 24 mg/dL   Creatinine, Ser 0.61 0.57 - 1.00 mg/dL   GFR calc non Af Amer 105 >59 mL/min/1.73   GFR calc Af Amer 121 >59 mL/min/1.73   BUN/Creatinine Ratio 16 9 - 23   Sodium 138 134 - 144 mmol/L   Potassium 3.9 3.5 - 5.2 mmol/L   Chloride 100 96 - 106 mmol/L   CO2 24 20 - 29 mmol/L   Calcium 9.6 8.7 - 10.2 mg/dL   Total Protein 6.2 6.0 - 8.5 g/dL   Albumin 4.1 3.8 - 4.9 g/dL   Globulin, Total 2.1 1.5 - 4.5 g/dL   Albumin/Globulin Ratio 2.0 1.2 - 2.2   Bilirubin Total 0.3 0.0 - 1.2 mg/dL   Alkaline Phosphatase 104 39 - 117 IU/L   AST 11 0 - 40 IU/L   ALT 12 0 - 32 IU/L  Lipid Panel w/o Chol/HDL Ratio  Result Value Ref Range   Cholesterol, Total 223 (H) 100 - 199 mg/dL   Triglycerides 184 (H) 0 - 149 mg/dL   HDL 50 >39 mg/dL   VLDL Cholesterol Cal 33 5 - 40 mg/dL   LDL Chol Calc (NIH) 140 (H) 0 - 99 mg/dL      Assessment & Plan:   Problem List Items Addressed This Visit      Endocrine   Hyperlipidemia associated with type 2 diabetes mellitus (HCC)    Chronic, ongoing.  No current statin.  Would benefit from statin therapy with her diabetes, but wishes to hold off at this time.  Will recheck lipid panel fasting next visit.  Could consider trial of Crestor three times a week.      Type 2 diabetes mellitus with hyperglycemia, with long-term current use of insulin (HCC) - Primary    Chronic, ongoing with recent A1C 10.8%.  Poor diet at home.  Previously followed by endo, but prefers not to return at this time. Continue Invokana and Basaglar at 10 units.  She would like to come off insulin in future, could try GLP.  Referral to CCM team for collaborative  approach placed last visit and provided her phone number for team  today at visit, she is to call them as on review they did attempt to call her.  Recommend continue to monitor BS at home BID and heavily focus on diet changes.  Return in 2 months for follow-up.          Follow up plan: Return in about 2 months (around 06/03/2019) for T2DM, HTN/HLD.

## 2019-04-05 NOTE — Patient Instructions (Signed)
Carbohydrate Counting for Diabetes Mellitus, Adult  Carbohydrate counting is a method of keeping track of how many carbohydrates you eat. Eating carbohydrates naturally increases the amount of sugar (glucose) in the blood. Counting how many carbohydrates you eat helps keep your blood glucose within normal limits, which helps you manage your diabetes (diabetes mellitus). It is important to know how many carbohydrates you can safely have in each meal. This is different for every person. A diet and nutrition specialist (registered dietitian) can help you make a meal plan and calculate how many carbohydrates you should have at each meal and snack. Carbohydrates are found in the following foods:  Grains, such as breads and cereals.  Dried beans and soy products.  Starchy vegetables, such as potatoes, peas, and corn.  Fruit and fruit juices.  Milk and yogurt.  Sweets and snack foods, such as cake, cookies, candy, chips, and soft drinks. How do I count carbohydrates? There are two ways to count carbohydrates in food. You can use either of the methods or a combination of both. Reading "Nutrition Facts" on packaged food The "Nutrition Facts" list is included on the labels of almost all packaged foods and beverages in the [place] It includes:  The serving size.  Information about nutrients in each serving, including the grams (g) of carbohydrate per serving. To use the "Nutrition Facts":  Decide how many servings you will have.  Multiply the number of servings by the number of carbohydrates per serving.  The resulting number is the total amount of carbohydrates that you will be having. Learning standard serving sizes of other foods When you eat carbohydrate foods that are not packaged or do not include "Nutrition Facts" on the label, you need to measure the servings in order to count the amount of carbohydrates:  Measure the foods that you will eat with a food scale or measuring cup, if  needed.  Decide how many standard-size servings you will eat.  Multiply the number of servings by 15. Most carbohydrate-rich foods have about 15 g of carbohydrates per serving. ? For example, if you eat 8 oz (170 g) of strawberries, you will have eaten 2 servings and 30 g of carbohydrates (2 servings x 15 g = 30 g).  For foods that have more than one food mixed, such as soups and casseroles, you must count the carbohydrates in each food that is included. The following list contains standard serving sizes of common carbohydrate-rich foods. Each of these servings has about 15 g of carbohydrates:   hamburger bun or  English muffin.   oz (15 mL) syrup.   oz (14 g) jelly.  1 slice of bread.  1 six-inch tortilla.  3 oz (85 g) cooked rice or pasta.  4 oz (113 g) cooked dried beans.  4 oz (113 g) starchy vegetable, such as peas, corn, or potatoes.  4 oz (113 g) hot cereal.  4 oz (113 g) mashed potatoes or  of a large baked potato.  4 oz (113 g) canned or frozen fruit.  4 oz (120 mL) fruit juice.  4-6 crackers.  6 chicken nuggets.  6 oz (170 g) unsweetened dry cereal.  6 oz (170 g) plain fat-free yogurt or yogurt sweetened with artificial sweeteners.  8 oz (240 mL) milk.  8 oz (170 g) fresh fruit or one small piece of fruit.  24 oz (680 g) popped popcorn. Example of carbohydrate counting Sample meal  3 oz (85 g) chicken breast.  6 oz (170 g)   brown rice.  4 oz (113 g) corn.  8 oz (240 mL) milk.  8 oz (170 g) strawberries with sugar-free whipped topping. Carbohydrate calculation 1. Identify the foods that contain carbohydrates: ? Rice. ? Corn. ? Milk. ? Strawberries. 2. Calculate how many servings you have of each food: ? 2 servings rice. ? 1 serving corn. ? 1 serving milk. ? 1 serving strawberries. 3. Multiply each number of servings by 15 g: ? 2 servings rice x 15 g = 30 g. ? 1 serving corn x 15 g = 15 g. ? 1 serving milk x 15 g = 15 g. ? 1  serving strawberries x 15 g = 15 g. 4. Add together all of the amounts to find the total grams of carbohydrates eaten: ? 30 g + 15 g + 15 g + 15 g = 75 g of carbohydrates total. Summary  Carbohydrate counting is a method of keeping track of how many carbohydrates you eat.  Eating carbohydrates naturally increases the amount of sugar (glucose) in the blood.  Counting how many carbohydrates you eat helps keep your blood glucose within normal limits, which helps you manage your diabetes.  A diet and nutrition specialist (registered dietitian) can help you make a meal plan and calculate how many carbohydrates you should have at each meal and snack. This information is not intended to replace advice given to you by your health care provider. Make sure you discuss any questions you have with your health care provider. Document Revised: 08/18/2016 Document Reviewed: 07/08/2015 Elsevier Patient Education  2020 [employer]  

## 2019-04-05 NOTE — Assessment & Plan Note (Signed)
Chronic, ongoing.  No current statin.  Would benefit from statin therapy with her diabetes, but wishes to hold off at this time.  Will recheck lipid panel fasting next visit.  Could consider trial of Crestor three times a week.

## 2019-04-16 ENCOUNTER — Ambulatory Visit: Payer: 59 | Attending: Pain Medicine | Admitting: Pain Medicine

## 2019-04-16 ENCOUNTER — Encounter: Payer: Self-pay | Admitting: Pain Medicine

## 2019-04-16 ENCOUNTER — Other Ambulatory Visit: Payer: Self-pay

## 2019-04-16 VITALS — BP 105/75 | HR 79 | Temp 97.5°F | Resp 18 | Ht 64.0 in | Wt 177.0 lb

## 2019-04-16 DIAGNOSIS — M25561 Pain in right knee: Secondary | ICD-10-CM | POA: Insufficient documentation

## 2019-04-16 DIAGNOSIS — M25562 Pain in left knee: Secondary | ICD-10-CM | POA: Diagnosis present

## 2019-04-16 DIAGNOSIS — M1711 Unilateral primary osteoarthritis, right knee: Secondary | ICD-10-CM | POA: Insufficient documentation

## 2019-04-16 DIAGNOSIS — G8929 Other chronic pain: Secondary | ICD-10-CM | POA: Diagnosis present

## 2019-04-16 MED ORDER — SODIUM HYALURONATE (VISCOSUP) 20 MG/2ML IX SOSY: 2.0000 mL | PREFILLED_SYRINGE | Freq: Once | INTRA_ARTICULAR | Status: AC

## 2019-04-16 MED ORDER — ROPIVACAINE HCL 2 MG/ML IJ SOLN
5.0000 mL | Freq: Once | INTRAMUSCULAR | Status: AC
  Administered 2019-04-16: 10 mL via INTRA_ARTICULAR
  Filled 2019-04-16: qty 10

## 2019-04-16 MED ORDER — LIDOCAINE HCL (PF) 1 % IJ SOLN
5.0000 mL | Freq: Once | INTRAMUSCULAR | Status: AC
  Administered 2019-04-16: 5 mL
  Filled 2019-04-16: qty 5

## 2019-04-16 NOTE — Patient Instructions (Signed)
____________________________________________________________________________________________  Post-Procedure Discharge Instructions  Instructions:  Apply ice:   Purpose: This will minimize any swelling and discomfort after procedure.   When: Day of procedure, as soon as you get home.  How: Fill a plastic sandwich bag with crushed ice. Cover it with a small towel and apply to injection site.  How long: (15 min on, 15 min off) Apply for 15 minutes then remove x 15 minutes.  Repeat sequence on day of procedure, until you go to bed.  Apply heat:   Purpose: To treat any soreness and discomfort from the procedure.  When: Starting the next day after the procedure.  How: Apply heat to procedure site starting the day following the procedure.  How long: May continue to repeat daily, until discomfort goes away.  Food intake: Start with clear liquids (like water) and advance to regular food, as tolerated.   Physical activities: Keep activities to a minimum for the first 8 hours after the procedure. After that, then as tolerated.  Driving: If you have received any sedation, be responsible and do not drive. You are not allowed to drive for 24 hours after having sedation.  Blood thinner: (Applies only to those taking blood thinners) You may restart your blood thinner 6 hours after your procedure.  Insulin: (Applies only to Diabetic patients taking insulin) As soon as you can eat, you may resume your normal dosing schedule.  Infection prevention: Keep procedure site clean and dry. Shower daily and clean area with soap and water.  Post-procedure Pain Diary: Extremely important that this be done correctly and accurately. Recorded information will be used to determine the next step in treatment. For the purpose of accuracy, follow these rules:  Evaluate only the area treated. Do not report or include pain from an untreated area. For the purpose of this evaluation, ignore all other areas of pain,  except for the treated area.  After your procedure, avoid taking a long nap and attempting to complete the pain diary after you wake up. Instead, set your alarm clock to go off every hour, on the hour, for the initial 8 hours after the procedure. Document the duration of the numbing medicine, and the relief you are getting from it.  Do not go to sleep and attempt to complete it later. It will not be accurate. If you received sedation, it is likely that you were given a medication that may cause amnesia. Because of this, completing the diary at a later time may cause the information to be inaccurate. This information is needed to plan your care.  Follow-up appointment: Keep your post-procedure follow-up evaluation appointment after the procedure (usually 2 weeks for most procedures, 6 weeks for radiofrequencies). DO NOT FORGET to bring you pain diary with you.   Expect: (What should I expect to see with my procedure?)  From numbing medicine (AKA: Local Anesthetics): Numbness or decrease in pain. You may also experience some weakness, which if present, could last for the duration of the local anesthetic.  Onset: Full effect within 15 minutes of injected.  Duration: It will depend on the type of local anesthetic used. On the average, 1 to 8 hours.   From steroids (Applies only if steroids were used): Decrease in swelling or inflammation. Once inflammation is improved, relief of the pain will follow.  Onset of benefits: Depends on the amount of swelling present. The more swelling, the longer it will take for the benefits to be seen. In some cases, up to 10 days.    Duration: Steroids will stay in the system x 2 weeks. Duration of benefits will depend on multiple posibilities including persistent irritating factors.  Side-effects: If present, they may typically last 2 weeks (the duration of the steroids).  Frequent: Cramps (if they occur, drink Gatorade and take over-the-counter Magnesium 450-500 mg  once to twice a day); water retention with temporary weight gain; increases in blood sugar; decreased immune system response; increased appetite.  Occasional: Facial flushing (red, warm cheeks); mood swings; menstrual changes.  Uncommon: Long-term decrease or suppression of natural hormones; bone thinning. (These are more common with higher doses or more frequent use. This is why we prefer that our patients avoid having any injection therapies in other practices.)   Very Rare: Severe mood changes; psychosis; aseptic necrosis.  From procedure: Some discomfort is to be expected once the numbing medicine wears off. This should be minimal if ice and heat are applied as instructed.  Call if: (When should I call?)  You experience numbness and weakness that gets worse with time, as opposed to wearing off.  New onset bowel or bladder incontinence. (Applies only to procedures done in the spine)  Emergency Numbers:  Durning business hours (Monday - Thursday, 8:00 AM - 4:00 PM) (Friday, 9:00 AM - 12:00 Noon): [phone]  After hours: [phone]  NOTE: If you are having a problem and are unable connect with, or to talk to a provider, then go to your nearest urgent care or emergency department. If the problem is serious and urgent, please call 911. ____________________________________________________________________________________________   ____________________________________________________________________________________________  Preparing for your procedure (without sedation)  Procedure appointments are limited to planned procedures: . No Prescription Refills. . No disability issues will be discussed. . No medication changes will be discussed.  Instructions: . Oral Intake: Do not eat or drink anything for at least 3 hours prior to your procedure. . Transportation: Unless otherwise stated by your physician, you may drive yourself after the procedure. . Blood Pressure Medicine:  Take your blood pressure medicine with a sip of water the morning of the procedure. . Blood thinners: Notify our staff if you are taking any blood thinners. Depending on which one you take, there will be specific instructions on how and when to stop it. . Diabetics on insulin: Notify the staff so that you can be scheduled 1st case in the morning. If your diabetes requires high dose insulin, take only  of your normal insulin dose the morning of the procedure and notify the staff that you have done so. . Preventing infections: Shower with an antibacterial soap the morning of your procedure.  . Build-up your immune system: Take 1000 mg of Vitamin C with every meal (3 times a day) the day prior to your procedure. . Antibiotics: Inform the staff if you have a condition or reason that requires you to take antibiotics before dental procedures. . Pregnancy: If you are pregnant, call and cancel the procedure. . Sickness: If you have a cold, fever, or any active infections, call and cancel the procedure. . Arrival: You must be in the facility at least 30 minutes prior to your scheduled procedure. . Children: Do not bring any children with you. . Dress appropriately: Bring dark clothing that you would not mind if they get stained. . Valuables: Do not bring any jewelry or valuables.  Reasons to call and reschedule or cancel your procedure: (Following these recommendations will minimize the risk of a serious complication.) . Surgeries: Avoid having procedures within 2 weeks   of any surgery. (Avoid for 2 weeks before or after any surgery). . Flu Shots: Avoid having procedures within 2 weeks of a flu shots or . (Avoid for 2 weeks before or after immunizations). . Barium: Avoid having a procedure within 7-10 days after having had a radiological study involving the use of radiological contrast. (Myelograms, Barium swallow or enema study). . Heart attacks: Avoid any elective procedures or surgeries for the initial 6  months after a "Myocardial Infarction" (Heart Attack). . Blood thinners: It is imperative that you stop these medications before procedures. Let us know if you if you take any blood thinner.  . Infection: Avoid procedures during or within two weeks of an infection (including chest colds or gastrointestinal problems). Symptoms associated with infections include: Localized redness, fever, chills, night sweats or profuse sweating, burning sensation when voiding, cough, congestion, stuffiness, runny nose, sore throat, diarrhea, nausea, vomiting, cold or Flu symptoms, recent or current infections. It is specially important if the infection is over the area that we intend to treat. . Heart and lung problems: Symptoms that may suggest an active cardiopulmonary problem include: cough, chest pain, breathing difficulties or shortness of breath, dizziness, ankle swelling, uncontrolled high or unusually low blood pressure, and/or palpitations. If you are experiencing any of these symptoms, cancel your procedure and contact your primary care physician for an evaluation.  Remember:  Regular Business hours are:  Monday to Thursday 8:00 AM to 4:00 PM  Provider's Schedule: Olanna Percifield, MD:  Procedure days: Tuesday and Thursday 7:30 AM to 4:00 PM  Bilal Lateef, MD:  Procedure days: Monday and Wednesday 7:30 AM to 4:00 PM ____________________________________________________________________________________________     

## 2019-04-16 NOTE — Progress Notes (Signed)
PROVIDER NOTE: Information contained herein reflects review and annotations entered in association with encounter. Interpretation of such information and data should be left to medically-trained personnel. Information provided to patient can be located elsewhere in the medical record under "Patient Instructions". Document created using STT-dictation technology, any transcriptional errors that may result from process are unintentional.    Patient: Carolyn Roy  Service Category: Procedure  Provider: Gaspar Cola, MD  DOB: 07/13/1965  DOS: 04/16/2019  Location: [facility]  MRN: [id number]  Setting: Ambulatory - outpatient  Referring Provider: Guadalupe Maple, MD  Type: Established Patient  Specialty: Interventional Pain Management  PCP: Guadalupe Maple, MD   Primary Reason for Visit: Interventional Pain Management Treatment. CC: Joint Pain (Right Knee)  Procedure:          Anesthesia, Analgesia, Anxiolysis:  Type: Therapeutic Intra-Articular Hyalgan Knee Injection #3  Region: Lateral infrapatellar Knee Region Level: Knee Joint Laterality: Right knee  Type: Local Anesthesia Indication(s): Analgesia         Local Anesthetic: Lidocaine 1-2% Route: Infiltration ([place]/IM) IV Access: Declined Sedation: Declined   Position: Sitting   Indications: 1. Osteoarthritis of knee (Right)   2. Chronic knee pain (Secondary Area of Pain) (Bilateral) (R>L)    Pain Score: Pre-procedure: 0-No pain/10 Post-procedure: 0-No pain/10   Pre-op Assessment:  Carolyn Roy is a 54 y.o. (year old), female patient, seen today for interventional treatment. She  has a past surgical history that includes Oophorectomy; Tubal ligation; Portacath placement (Right, 07/14/2017); Axillary lymph node biopsy (Left, 07/14/2017); Breast lumpectomy (Left, 01/12/2018); Breast biopsy (Left); Partial mastectomy with needle localization (Left, 01/12/2018); and Sentinel node biopsy (Left, 01/12/2018). Carolyn Roy has a  current medication list which includes the following prescription(s): albuterol, anastrozole, aspirin-acetaminophen-caffeine, canagliflozin, duloxetine, glucose blood, goserelin, basaglar kwikpen, lidocaine-prilocaine, ondansetron, oxycodone, [START ON 04/22/2019] oxycodone, [START ON 05/22/2019] oxycodone, pregabalin, prochlorperazine, trazodone, citalopram, and gabapentin, and the following Facility-Administered Medications: heparin lock flush and sodium chloride flush. Her primarily concern today is the Joint Pain (Right Knee)  Initial Vital Signs:  Pulse/HCG Rate: 79  Temp: (!) 97.5 F (36.4 C) Resp: 18 BP: 105/75 SpO2: 98 %  BMI: Estimated body mass index is 30.38 kg/m as calculated from the following:   Height as of this encounter: 5\' 4"  (1.626 m).   Weight as of this encounter: 177 lb (80.3 kg).  Risk Assessment: Allergies: Reviewed. She is allergic to aspirin.  Allergy Precautions: None required Coagulopathies: Reviewed. None identified.  Blood-thinner therapy: None at this time Active Infection(s): Reviewed. None identified. Carolyn Roy is afebrile  Site Confirmation: Carolyn Roy was asked to confirm the procedure and laterality before marking the site Procedure checklist: Completed Consent: Before the procedure and under the influence of no sedative(s), amnesic(s), or anxiolytics, the patient was informed of the treatment options, risks and possible complications. To fulfill our ethical and legal obligations, as recommended by the American Medical Association's Code of Ethics, I have informed the patient of my clinical impression; the nature and purpose of the treatment or procedure; the risks, benefits, and possible complications of the intervention; the alternatives, including doing nothing; the risk(s) and benefit(s) of the alternative treatment(s) or procedure(s); and the risk(s) and benefit(s) of doing nothing. The patient was provided information about the general risks and  possible complications associated with the procedure. These may include, but are not limited to: failure to achieve desired goals, infection, bleeding, organ or nerve damage, allergic reactions, paralysis, and death. In addition, the patient  was informed of those risks and complications associated to the procedure, such as failure to decrease pain; infection; bleeding; organ or nerve damage with subsequent damage to sensory, motor, and/or autonomic systems, resulting in permanent pain, numbness, and/or weakness of one or several areas of the body; allergic reactions; (i.e.: anaphylactic reaction); and/or death. Furthermore, the patient was informed of those risks and complications associated with the medications. These include, but are not limited to: allergic reactions (i.e.: anaphylactic or anaphylactoid reaction(s)); adrenal axis suppression; blood sugar elevation that in diabetics may result in ketoacidosis or comma; water retention that in patients with history of congestive heart failure may result in shortness of breath, pulmonary edema, and decompensation with resultant heart failure; weight gain; swelling or edema; medication-induced neural toxicity; particulate matter embolism and blood vessel occlusion with resultant organ, and/or nervous system infarction; and/or aseptic necrosis of one or more joints. Finally, the patient was informed that Medicine is not an exact science; therefore, there is also the possibility of unforeseen or unpredictable risks and/or possible complications that may result in a catastrophic outcome. The patient indicated having understood very clearly. We have given the patient no guarantees and we have made no promises. Enough time was given to the patient to ask questions, all of which were answered to the patient's satisfaction. Carolyn Roy has indicated that she wanted to continue with the procedure. Attestation: I, the ordering provider, attest that I have discussed with the  patient the benefits, risks, side-effects, alternatives, likelihood of achieving goals, and potential problems during recovery for the procedure that I have provided informed consent. Date  Time: 04/16/2019 10:19 AM  Pre-Procedure Preparation:  Monitoring: As per clinic protocol. Respiration, ETCO2, SpO2, BP, heart rate and rhythm monitor placed and checked for adequate function Safety Precautions: Patient was assessed for positional comfort and pressure points before starting the procedure. Time-out: I initiated and conducted the "Time-out" before starting the procedure, as per protocol. The patient was asked to participate by confirming the accuracy of the "Time Out" information. Verification of the correct person, site, and procedure were performed and confirmed by me, the nursing staff, and the patient. "Time-out" conducted as per Joint Commission's Universal Protocol (UP.01.01.01). Time: 1038  Description of Procedure:          Target Area: Knee Joint Approach: Just above the Lateral tibial plateau, lateral to the infrapatellar tendon. Area Prepped: Entire knee area, from the mid-thigh to the mid-shin. Prepping solution: DuraPrep (Iodine Povacrylex [0.7% available iodine] and Isopropyl Alcohol, 74% w/w) Safety Precautions: Aspiration looking for blood return was conducted prior to all injections. At no point did we inject any substances, as a needle was being advanced. No attempts were made at seeking any paresthesias. Safe injection practices and needle disposal techniques used. Medications properly checked for expiration dates. SDV (single dose vial) medications used. Description of the Procedure: Protocol guidelines were followed. The patient was placed in position over the fluoroscopy table. The target area was identified and the area prepped in the usual manner. Skin & deeper tissues infiltrated with local anesthetic. Appropriate amount of time allowed to pass for local anesthetics to take  effect. The procedure needles were then advanced to the target area. Proper needle placement secured. Negative aspiration confirmed. Solution injected in intermittent fashion, asking for systemic symptoms every 0.5cc of injectate. The needles were then removed and the area cleansed, making sure to leave some of the prepping solution back to take advantage of its long term bactericidal properties. Vitals:  04/16/19 1015  BP: 105/75  Pulse: 79  Resp: 18  Temp: (!) 97.5 F (36.4 C)  TempSrc: Temporal  SpO2: 98%  Weight: 177 lb (80.3 kg)  Height: 5\' 4"  (1.626 m)    Start Time: 1039 hrs. End Time: 1039 hrs. Materials:  Needle(s) Type: Regular needle Gauge: 25G Length: 1.5-in Medication(s): Please see orders for medications and dosing details.  Imaging Guidance:          Type of Imaging Technique: None used Indication(s): N/A Exposure Time: No patient exposure Contrast: None used. Fluoroscopic Guidance: N/A Ultrasound Guidance: N/A Interpretation: N/A  Antibiotic Prophylaxis:   Anti-infectives (From admission, onward)   None     Indication(s): None identified  Post-operative Assessment:  Post-procedure Vital Signs:  Pulse/HCG Rate: 79  Temp: (!) 97.5 F (36.4 C) Resp: 18 BP: 105/75 SpO2: 98 %  EBL: None  Complications: No immediate post-treatment complications observed by team, or reported by patient.  Note: The patient tolerated the entire procedure well. A repeat set of vitals were taken after the procedure and the patient was kept under observation following institutional policy, for this type of procedure. Post-procedural neurological assessment was performed, showing return to baseline, prior to discharge. The patient was provided with post-procedure discharge instructions, including a section on how to identify potential problems. Should any problems arise concerning this procedure, the patient was given instructions to immediately contact us, at any time, without  hesitation. In any case, we plan to contact the patient by telephone for a follow-up status report regarding this interventional procedure.  Comments:  No additional relevant information.  Plan of Care  Orders:  Orders Placed This Encounter  Procedures  . KNEE INJECTION    Hyalgan knee injection to be done by MD.    Scheduling Instructions:     Procedure: Intra-articular Hyalgan Knee injection #3     Side(s): Right Knee     Sedation: None     Timeframe: Today    Order Specific Question:   Where will this procedure be performed?    Answer:   [facility]  . KNEE INJECTION    Hyalgan knee injection. Please order Hyalgan.    Standing Status:   Future    Standing Expiration Date:   05/17/2019    Scheduling Instructions:     Procedure: Intra-articular Hyalgan Knee injection #4     Side: Right-sided     Sedation: None     Timeframe: in two (2) weeks    Order Specific Question:   Where will this procedure be performed?    Answer:   [facility]  . Informed Consent Details: Physician/Practitioner Attestation; Transcribe to consent form and obtain patient signature    Provider Attestation: I, [place] Dossie Arbour, MD, (Pain Management Specialist), the physician/practitioner, attest that I have discussed with the patient the benefits, risks, side effects, alternatives, likelihood of achieving goals and potential problems during recovery for the procedure that I have provided informed consent.    Scheduling Instructions:     Procedure: Therapeutic, right sided, intra-articular Hyalgan knee injection     Indications: Chronic right knee pain secondary to primary osteoarthritis of the knee     Note: Always confirm laterality of pain with Ms. White, before procedure.     Transcribe to consent form and obtain patient signature.  . Provide equipment / supplies at bedside    Equipment required: Single use, disposable, "Block Tray"    Standing Status:   Standing    Number  of  Occurrences:   1    Order Specific Question:   Specify    Answer:   Block Tray   Chronic Opioid Analgesic:  Oxycodone IR 5 mg, 1 tab PO BID (10 mg/day of oxycodone) (Average: 2 tabs/day = 10 mg/day of oxycodone) MME/day: 15 mg/day.   Medications ordered for procedure: Meds ordered this encounter  Medications  . lidocaine (PF) (XYLOCAINE) 1 % injection 5 mL  . ropivacaine (PF) 2 mg/mL (0.2%) (NAROPIN) injection 5 mL  . Sodium Hyaluronate SOSY 2 mL   Medications administered: We administered lidocaine (PF), ropivacaine (PF) 2 mg/mL (0.2%), and Sodium Hyaluronate.  See the medical record for exact dosing, route, and time of administration.  Follow-up plan:   Return in about 2 weeks (around 04/30/2019) for Procedure (no sedation): (R) Hyalgan #4.       Considering:   Possible bilateral lumbar facet RFA  Diagnostic lumbar sympathetic block  Diagnostic right knee Hyalgan series #1  Diagnostic right genicular NB  Possible right genicular nerve RFA    Palliative PRN treatment(s):   Therapeutic right IA knee joint injection #3 (steroids)  Diagnostic bilateral lumbar facet block #2        Recent Visits Date Type Provider Dept  04/02/19 Procedure visit Milinda Pointer, MD [facility]  03/19/19 Procedure visit Milinda Pointer, MD [facility]  03/13/19 Telemedicine Milinda Pointer, MD [facility]  Showing recent visits within past 90 days and meeting all other requirements   Today's Visits Date Type Provider Dept  04/16/19 Procedure visit Milinda Pointer, MD [facility]  Showing today's visits and meeting all other requirements   Future Appointments Date Type Provider Dept  04/30/19 Appointment Milinda Pointer, MD [facility]  06/19/19 Appointment Milinda Pointer, MD [facility]  Showing future appointments within next 90 days and meeting all other requirements   Disposition: Discharge home  Discharge  (Date  Time): 04/16/2019; 1040 hrs.   Primary Care Physician: Guadalupe Maple, MD Location: [facility] Note by: Gaspar Cola, MD Date: 04/16/2019; Time: 11:56 AM  Disclaimer:  Medicine is not an Chief Strategy Officer. The only guarantee in medicine is that nothing is guaranteed. It is important to note that the decision to proceed with this intervention was based on the information collected from the patient. The Data and conclusions were drawn from the patient's questionnaire, the interview, and the physical examination. Because the information was provided in large part by the patient, it cannot be guaranteed that it has not been purposely or unconsciously manipulated. Every effort has been made to obtain as much relevant data as possible for this evaluation. It is important to note that the conclusions that lead to this procedure are derived in large part from the available data. Always take into account that the treatment will also be dependent on availability of resources and existing treatment guidelines, considered by other Pain Management Practitioners as being common knowledge and practice, at the time of the intervention. For Medico-Legal purposes, it is also important to point out that variation in procedural techniques and pharmacological choices are the acceptable norm. The indications, contraindications, technique, and results of the above procedure should only be interpreted and judged by a Board-Certified Interventional Pain Specialist with extensive familiarity and expertise in the same exact procedure and technique.

## 2019-04-16 NOTE — Progress Notes (Signed)
Safety precautions to be maintained throughout the outpatient stay will include: orient to surroundings, keep bed in low position, maintain call bell within reach at all times, provide assistance with transfer out of bed and ambulation.  

## 2019-04-17 ENCOUNTER — Telehealth: Payer: Self-pay | Admitting: *Deleted

## 2019-04-17 NOTE — Telephone Encounter (Signed)
Denies any post procedure issues. 

## 2019-04-19 ENCOUNTER — Inpatient Hospital Stay: Payer: No Typology Code available for payment source | Attending: Internal Medicine | Admitting: Internal Medicine

## 2019-04-19 ENCOUNTER — Encounter: Payer: Self-pay | Admitting: Internal Medicine

## 2019-04-19 ENCOUNTER — Inpatient Hospital Stay: Payer: No Typology Code available for payment source

## 2019-04-19 ENCOUNTER — Other Ambulatory Visit: Payer: Self-pay

## 2019-04-19 VITALS — BP 111/83 | HR 87 | Temp 95.6°F | Wt 180.0 lb

## 2019-04-19 DIAGNOSIS — Z170 Estrogen receptor positive status [ER+]: Secondary | ICD-10-CM | POA: Diagnosis not present

## 2019-04-19 DIAGNOSIS — Z8042 Family history of malignant neoplasm of prostate: Secondary | ICD-10-CM | POA: Diagnosis not present

## 2019-04-19 DIAGNOSIS — C50512 Malignant neoplasm of lower-outer quadrant of left female breast: Secondary | ICD-10-CM | POA: Diagnosis not present

## 2019-04-19 DIAGNOSIS — F17210 Nicotine dependence, cigarettes, uncomplicated: Secondary | ICD-10-CM | POA: Insufficient documentation

## 2019-04-19 DIAGNOSIS — Z9221 Personal history of antineoplastic chemotherapy: Secondary | ICD-10-CM | POA: Diagnosis not present

## 2019-04-19 DIAGNOSIS — Z886 Allergy status to analgesic agent status: Secondary | ICD-10-CM | POA: Diagnosis not present

## 2019-04-19 DIAGNOSIS — F329 Major depressive disorder, single episode, unspecified: Secondary | ICD-10-CM | POA: Insufficient documentation

## 2019-04-19 DIAGNOSIS — G8929 Other chronic pain: Secondary | ICD-10-CM | POA: Diagnosis not present

## 2019-04-19 DIAGNOSIS — Z803 Family history of malignant neoplasm of breast: Secondary | ICD-10-CM | POA: Diagnosis not present

## 2019-04-19 DIAGNOSIS — R5383 Other fatigue: Secondary | ICD-10-CM | POA: Insufficient documentation

## 2019-04-19 DIAGNOSIS — Z833 Family history of diabetes mellitus: Secondary | ICD-10-CM | POA: Insufficient documentation

## 2019-04-19 DIAGNOSIS — C50812 Malignant neoplasm of overlapping sites of left female breast: Secondary | ICD-10-CM | POA: Diagnosis not present

## 2019-04-19 DIAGNOSIS — Z923 Personal history of irradiation: Secondary | ICD-10-CM | POA: Diagnosis not present

## 2019-04-19 DIAGNOSIS — E1169 Type 2 diabetes mellitus with other specified complication: Secondary | ICD-10-CM | POA: Diagnosis not present

## 2019-04-19 DIAGNOSIS — M2550 Pain in unspecified joint: Secondary | ICD-10-CM | POA: Insufficient documentation

## 2019-04-19 DIAGNOSIS — Z794 Long term (current) use of insulin: Secondary | ICD-10-CM | POA: Diagnosis not present

## 2019-04-19 DIAGNOSIS — M549 Dorsalgia, unspecified: Secondary | ICD-10-CM | POA: Insufficient documentation

## 2019-04-19 DIAGNOSIS — F419 Anxiety disorder, unspecified: Secondary | ICD-10-CM | POA: Diagnosis not present

## 2019-04-19 DIAGNOSIS — Z90721 Acquired absence of ovaries, unilateral: Secondary | ICD-10-CM | POA: Diagnosis not present

## 2019-04-19 DIAGNOSIS — Z8379 Family history of other diseases of the digestive system: Secondary | ICD-10-CM | POA: Diagnosis not present

## 2019-04-19 DIAGNOSIS — Z79899 Other long term (current) drug therapy: Secondary | ICD-10-CM | POA: Diagnosis not present

## 2019-04-19 LAB — CBC WITH DIFFERENTIAL/PLATELET
Abs Immature Granulocytes: 0.01 10*3/uL (ref 0.00–0.07)
Basophils Absolute: 0 10*3/uL (ref 0.0–0.1)
Basophils Relative: 0 %
Eosinophils Absolute: 0 10*3/uL (ref 0.0–0.5)
Eosinophils Relative: 1 %
HCT: 45.1 % (ref 36.0–46.0)
Hemoglobin: 14.8 g/dL (ref 12.0–15.0)
Immature Granulocytes: 0 %
Lymphocytes Relative: 16 %
Lymphs Abs: 0.9 10*3/uL (ref 0.7–4.0)
MCH: 31.7 pg (ref 26.0–34.0)
MCHC: 32.8 g/dL (ref 30.0–36.0)
MCV: 96.6 fL (ref 80.0–100.0)
Monocytes Absolute: 0.3 10*3/uL (ref 0.1–1.0)
Monocytes Relative: 5 %
Neutro Abs: 4.3 10*3/uL (ref 1.7–7.7)
Neutrophils Relative %: 78 %
Platelets: 197 10*3/uL (ref 150–400)
RBC: 4.67 MIL/uL (ref 3.87–5.11)
RDW: 12.8 % (ref 11.5–15.5)
WBC: 5.5 10*3/uL (ref 4.0–10.5)
nRBC: 0 % (ref 0.0–0.2)

## 2019-04-19 LAB — COMPREHENSIVE METABOLIC PANEL
ALT: 15 U/L (ref 0–44)
AST: 14 U/L — ABNORMAL LOW (ref 15–41)
Albumin: 3.8 g/dL (ref 3.5–5.0)
Alkaline Phosphatase: 79 U/L (ref 38–126)
Anion gap: 8 (ref 5–15)
BUN: 16 mg/dL (ref 6–20)
CO2: 28 mmol/L (ref 22–32)
Calcium: 9.3 mg/dL (ref 8.9–10.3)
Chloride: 103 mmol/L (ref 98–111)
Creatinine, Ser: 0.49 mg/dL (ref 0.44–1.00)
GFR calc Af Amer: >60 mL/min (ref >60)
GFR calc non Af Amer: >60 mL/min (ref >60)
Glucose, Bld: 256 mg/dL — ABNORMAL HIGH (ref 70–99)
Potassium: 4.6 mmol/L (ref 3.5–5.1)
Sodium: 139 mmol/L (ref 135–145)
Total Bilirubin: 0.6 mg/dL (ref 0.3–1.2)
Total Protein: 6.5 g/dL (ref 6.5–8.1)

## 2019-04-19 MED ORDER — GOSERELIN ACETATE 10.8 MG ~~LOC~~ IMPL
10.8000 mg | DRUG_IMPLANT | Freq: Once | SUBCUTANEOUS | Status: AC
  Administered 2019-04-19: 10.8 mg via SUBCUTANEOUS
  Filled 2019-04-19: qty 10.8

## 2019-04-19 NOTE — Progress Notes (Signed)
[place] CONSULT NOTE  Patient Care Team: Guadalupe Maple, MD as PCP - General (Family Medicine) Bary Castilla, [name], MD (General Surgery) [name], [name], [name] (Obstetrics and Gynecology) Cammie Sickle, MD as Consulting Physician (Oncology)  CHIEF COMPLAINTS/PURPOSE OF CONSULTATION: Breast cancer  Oncology History Overview Note  # MAY 2019-  clinical stage IIIA (T3N1Mx) left breast cancer s/p biopsy on 06/14/2017. -Pathology revealed grade III invasive ductal carcinoma. -Axillary FNA revealed malignant cells c/w metastatic carcinoma. Tumor was ER + (90%), PR + (30%), Her2/neu - and Ki67 70%.  CA27.29 was 7.8 on 06/14/2017.  # She received 4 cycles of AC with Neulasta support (07/20/2017 - 08/31/2017).;  neoadjuvant Taxol on 09/14/2017.  #DEC 2019- Lumpectomy/sentinel lymph node biopsy [Dr.[name]]-complete pathologic response  # s/p RT [delayed sec to wound infection; Dr.[name]] finished RT [4/12]  # April 14th 2020- START TAM; stopped in mid-May secondary intolerance [severe migraines].  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 Secord/Berchuck]-negative for malignancy/ # DM-2- poorly controlled.   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   # PAP SMEAR- RECOMMENDED 2022- summer  -------------------------------------------  DIAGNOSIS: left breast cancer  STAGE:  III       ;GOALS: cure  CURRENT/MOST RECENT THERAPY Tam    Cancer of midline of breast, left (HCC)  06/15/2017 Initial Diagnosis   Cancer of midline of breast, left ([place])   06/26/2017 -  Chemotherapy   The patient had DOXOrubicin (ADRIAMYCIN) chemo injection 122 mg, 60 mg/m2 = 122 mg, Intravenous,  Once, 4 of 4 cycles Administration: 122 mg (07/20/2017), 122  mg (08/03/2017), 122 mg (08/17/2017), 122 mg (08/31/2017) palonosetron (ALOXI) injection 0.25 mg, 0.25 mg, Intravenous,  Once, 4 of 4 cycles Administration: 0.25 mg (07/20/2017), 0.25 mg (08/03/2017), 0.25 mg (08/17/2017), 0.25 mg (08/31/2017) pegfilgrastim (NEULASTA) injection 6 mg, 6 mg, Subcutaneous, Once, 5 of 5 cycles Administration: 6 mg (07/21/2017), 6 mg (08/04/2017), 6 mg (08/18/2017), 6 mg (09/01/2017) cyclophosphamide (CYTOXAN) 1,220 mg in sodium chloride 0.9 % 250 mL chemo infusion, 600 mg/m2 = 1,220 mg, Intravenous,  Once, 4 of 4 cycles Administration: 1,220 mg (07/20/2017), 1,220 mg (08/03/2017), 1,220 mg (08/17/2017), 1,220 mg (08/31/2017) PACLitaxel (TAXOL) 162 mg in sodium chloride 0.9 % 250 mL chemo infusion (</= 67m/m2), 80 mg/m2 = 162 mg, Intravenous,  Once, 10 of 12 cycles Dose modification: 65 mg/m2 (original dose 80 mg/m2, Cycle 13, Reason: Provider Judgment, Comment: neuropathy) Administration: 162 mg (09/14/2017), 162 mg (09/21/2017), 162 mg (09/28/2017), 162 mg (10/05/2017), 162 mg (10/12/2017), 162 mg (10/19/2017), 162 mg (10/26/2017), 132 mg (11/02/2017), 132 mg (11/09/2017), 132 mg (11/16/2017) fosaprepitant (EMEND) 150 mg, dexamethasone (DECADRON) 12 mg in sodium chloride 0.9 % 145 mL IVPB, , Intravenous,  Once, 4 of 4 cycles Administration:  (07/20/2017),  (08/03/2017),  (08/17/2017),  (08/31/2017)  for chemotherapy treatment.    Malignant neoplasm of lower-outer quadrant of left breast of female, estrogen receptor positive ([place]  11/08/2017 Initial Diagnosis   Malignant neoplasm of lower-outer quadrant of left breast of female, estrogen receptor positive ([place]    HISTORY OF PRESENTING ILLNESS:  [name] 54y.o.  female with a history of stage III ER PR positive HER-2/neu negative breast cancer currently on Arimidex plus Zoladex is here for follow-up.  Patient also has significant improvement of her symptoms of  anxiety/depression.  She is currently being followed by psychiatry  closely.  Otherwise continues to have chronic joint pains back pain not any worse.  No new lumps or bumps.  No fevers or chills.  Mild fatigue.  Review of Systems  Constitutional: Positive for malaise/fatigue. Negative for chills, diaphoresis, fever and weight loss.  HENT: Negative for nosebleeds and sore throat.   Eyes: Negative for double vision.  Respiratory: Negative for cough, hemoptysis, sputum production, shortness of breath and wheezing.   Cardiovascular: Negative for chest pain, palpitations, orthopnea and leg swelling.  Gastrointestinal: Negative for abdominal pain, blood in stool, constipation, diarrhea, heartburn, melena, nausea and vomiting.  Genitourinary: Negative for dysuria, frequency and urgency.  Musculoskeletal: Positive for joint pain. Negative for back pain.  Skin: Negative.  Negative for itching and rash.  Neurological: Positive for tingling. Negative for dizziness, focal weakness, weakness and headaches.  Endo/Heme/Allergies: Does not bruise/bleed easily.  Psychiatric/Behavioral: Negative for depression. The patient is not nervous/anxious and does not have insomnia.      MEDICAL HISTORY:  Past Medical History:  Diagnosis Date  . Breast cancer ([place])   . Cancer ([place]) 06/15/2017   5.1 cm, T3,N1 (clinical): ER/ PR positive, Her 2 neu not overexpressed, High Ki 67. Neuoadjuvant chemotherapy.   . Depression   . Diabetes mellitus without complication ([place]) 4259  . Endometriosis   . Family history of breast cancer   . Headache    migraines  . Hyperlipidemia   . Ovarian mass   . Personal history of chemotherapy   . Personal history of radiation therapy   . Pneumonia    2018    SURGICAL HISTORY: Past Surgical History:  Procedure Laterality Date  . AXILLARY LYMPH NODE BIOPSY Left 07/14/2017   Procedure: INSERTION GEL MARK CLIP LEFT AXILLA;  Surgeon: Robert Bellow, MD;  Location: [facility] ORS;  Service: General;  Laterality: Left;  . BREAST BIOPSY Left    Dr  Orlene Och BREAST METASTATIC CARCINOMA  . BREAST LUMPECTOMY Left 01/12/2018  . OOPHORECTOMY    . PARTIAL MASTECTOMY WITH NEEDLE LOCALIZATION Left 01/12/2018   Procedure: PARTIAL MASTECTOMY WITH NEEDLE LOCALIZATION;  Surgeon: Robert Bellow, MD;  Location: [facility] ORS;  Service: General;  Laterality: Left;  . PORTACATH PLACEMENT Right 07/14/2017   Procedure: INSERTION PORT-A-CATH;  Surgeon: Robert Bellow, MD;  Location: [facility] ORS;  Service: General;  Laterality: Right;  . SENTINEL NODE BIOPSY Left 01/12/2018   Procedure: SENTINEL NODE BIOPSY;  Surgeon: Robert Bellow, MD;  Location: [facility] ORS;  Service: General;  Laterality: Left;  . TUBAL LIGATION      SOCIAL HISTORY: Social History   Socioeconomic History  . Marital status: Single    Spouse name: Not on file  . Number of children: Not on file  . Years of education: Not on file  . Highest education level: Not on file  Occupational History  . Not on file  Tobacco Use  . Smoking status: Current Every Day Smoker    Packs/day: 0.50    Years: 11.00    Pack years: 5.50    Types: Cigarettes  . Smokeless tobacco: Former Systems developer    Types: Snuff  Substance and Sexual Activity  . Alcohol use: No    Alcohol/week: 0.0 standard drinks  . Drug use: No  . Sexual activity: Yes  Other Topics Concern  . Not on file  Social History Narrative  . Not on file   Social Determinants of Health   Financial Resource Strain:   .  Difficulty of Paying Living Expenses:   Food Insecurity:   . Worried About Charity fundraiser in the Last Year:   . Arboriculturist in the Last Year:   Transportation Needs:   . Film/video editor (Medical):   [name] Lack of Transportation (Non-Medical):   Physical Activity:   . Days of Exercise per Week:   . Minutes of Exercise per Session:   Stress:   . Feeling of Stress :   Social Connections:   . Frequency of Communication with Friends and Family:   . Frequency of Social Gatherings with Friends and Family:    . Attends Religious Services:   . Active Member of Clubs or Organizations:   . Attends Archivist Meetings:   [name] Marital Status:   Intimate Partner Violence:   . Fear of Current or Ex-Partner:   . Emotionally Abused:   [name] Physically Abused:   . Sexually Abused:     FAMILY HISTORY: Family History  Problem Relation Age of Onset  . Other Father        No info about father or paternal relatives  . Diabetes Brother   . Pancreatitis Brother   . Prostate cancer Brother 74       currently 24 / maternal half-brother  . Breast cancer Maternal Grandmother 40       deceased 66s  . Breast cancer Maternal Aunt 65       currently 86  . Breast cancer Other 89       mother's sister; deceased 23  . Breast cancer Other        mother's sister; age at dx unknown    ALLERGIES:  is allergic to aspirin.  MEDICATIONS:  Current Outpatient Medications  Medication Sig Dispense Refill  . albuterol (PROVENTIL HFA;VENTOLIN HFA) 108 (90 Base) MCG/ACT inhaler Inhale 2 puffs into the lungs every 6 (six) hours as needed for wheezing or shortness of breath. 1 Inhaler 0  . anastrozole (ARIMIDEX) 1 MG tablet Take 1 tablet (1 mg total) by mouth daily. 90 tablet 3  . aspirin-acetaminophen-caffeine (EXCEDRIN MIGRAINE) 250-250-65 MG tablet Take 2 tablets by mouth daily as needed for headache.    . canagliflozin (INVOKANA) 300 MG TABS tablet Take 1 tablet (300 mg total) by mouth daily. 90 tablet 3  . DULoxetine (CYMBALTA) 30 MG capsule Take 60 mg by mouth daily.    [name] glucose blood (ONE TOUCH ULTRA TEST) test strip Use up to 4 times/day 100 each 12  . goserelin (ZOLADEX) 3.6 MG injection Inject 3.6 mg into the skin every 28 (twenty-eight) days.    . Insulin Glargine (BASAGLAR KWIKPEN) 100 UNIT/ML SOPN Inject 0.1 mLs (10 Units total) into the skin at bedtime. 15 pen 2  . lidocaine-prilocaine (EMLA) cream Apply to affected area once 30 g 3  . ondansetron (ZOFRAN) 8 MG tablet Take 1 tablet (8 mg total) by  mouth 2 (two) times daily as needed. Start on the third day after chemotherapy. 30 tablet 1  . oxyCODONE (OXY IR/ROXICODONE) 5 MG immediate release tablet Take 1 tablet (5 mg total) by mouth 2 (two) times daily as needed for severe pain. Must last 30 days 60 tablet 0  . oxyCODONE (OXY IR/ROXICODONE) 5 MG immediate release tablet Take 1 tablet (5 mg total) by mouth 2 (two) times daily as needed for severe pain. Must last 30 days 60 tablet 0  . [START ON 05/22/2019] oxyCODONE (OXY IR/ROXICODONE) 5 MG immediate release tablet Take 1  tablet (5 mg total) by mouth 2 (two) times daily as needed for severe pain. Must last 30 days 60 tablet 0  . pregabalin (LYRICA) 75 MG capsule Take 1 capsule (75 mg total) by mouth 3 (three) times daily. 90 capsule 2  . prochlorperazine (COMPAZINE) 10 MG tablet Take 1 tablet (10 mg total) by mouth every 6 (six) hours as needed (Nausea or vomiting). 30 tablet 1  . traZODone (DESYREL) 50 MG tablet TAKE 1-2 TABLETS (50-100 MG TOTAL) BY MOUTH AT BEDTIME AS NEEDED FOR SLEEP. 180 tablet 1  . citalopram (CELEXA) 20 MG tablet TAKE 1 TABLET (20 MG TOTAL) BY MOUTH DAILY. TAKE IN AM (Patient not taking: Reported on 04/05/2019) 90 tablet 1  . gabapentin (NEURONTIN) 300 MG capsule Take 3 capsules (900 mg total) by mouth 4 (four) times daily. (Patient not taking: Reported on 04/19/2019) 360 capsule 5   No current facility-administered medications for this visit.   Facility-Administered Medications Ordered in Other Visits  Medication Dose Route Frequency Provider Last Rate Last Admin  . heparin lock flush 100 unit/mL  500 Units Intravenous Once Corcoran, Melissa C, MD      . sodium chloride flush (NS) 0.9 % injection 10 mL  10 mL Intravenous Once Lequita Asal, MD          .  PHYSICAL EXAMINATION: ECOG PERFORMANCE STATUS: 0 - Asymptomatic  Vitals:   04/19/19 1047  BP: 111/83  Pulse: 87  Temp: (!) 95.6 F (35.3 C)   Filed Weights   04/19/19 1047  Weight: 180 lb (81.6 kg)     Physical Exam  Constitutional: She is oriented to person, place, and time and well-developed, well-nourished, and in no distress.  HENT:  Head: Normocephalic and atraumatic.  Mouth/Throat: Oropharynx is clear and moist. No oropharyngeal exudate.  Eyes: Pupils are equal, round, and reactive to light.  Cardiovascular: Normal rate and regular rhythm.  Pulmonary/Chest: No respiratory distress. She has no wheezes.  Abdominal: Soft. Bowel sounds are normal. She exhibits no distension and no mass. There is no abdominal tenderness. There is no rebound and no guarding.  Musculoskeletal:        General: No tenderness or edema. Normal range of motion.     Cervical back: Normal range of motion and neck supple.  Neurological: She is alert and oriented to person, place, and time.  Skin: Skin is warm.  Psychiatric: Affect normal.     LABORATORY DATA:  I have reviewed the data as listed Lab Results  Component Value Date   WBC 5.5 04/19/2019   HGB 14.8 04/19/2019   HCT 45.1 04/19/2019   MCV 96.6 04/19/2019   PLT 197 04/19/2019   Recent Labs    01/18/19 1312 03/06/19 1613 04/19/19 1028  NA 137 138 139  K 3.5 3.9 4.6  CL 99 100 103  CO2 '24 24 28  ' GLUCOSE 464* 320* 256*  BUN '12 10 16  ' CREATININE 0.60 0.61 0.49  CALCIUM 8.9 9.6 9.3  GFRNONAA >60 105 >60  GFRAA >60 121 >60  PROT 6.6 6.2 6.5  ALBUMIN 3.7 4.1 3.8  AST 23 11 14*  ALT '18 12 15  ' ALKPHOS 103 104 79  BILITOT 0.4 0.3 0.6    RADIOGRAPHIC STUDIES: I have personally reviewed the radiological images as listed and agreed with the findings in the report. No results found.  ASSESSMENT & PLAN:   Malignant neoplasm of lower-outer quadrant of left breast of female, estrogen receptor positive ([place]) # breast cancer  Stage III ER/PR pos her 2 NEG. currently on anastrazole+ Zoldadex.  No clinical evidence of recurrence.  STABLE.   #Continue current therapy; tolerating fairly well except for continued joint pains.  Zometa q 6  months; zometa today.   # Depression/ Insomnia- STABLE;  On cymblata-[ Dr.kapur]  # Joint pains- stable;   Sec to AI-  on vit D/continue Osteo Bi-Flex/Neurontin.  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  Stable.[name]  # DM-2 on insulin-;improved; but not wel controlled.  Defer to PCP  # DISPOSITION: # Zoladex  today.  # Follow up in 3 month;  MD;cbc/cmp/ca-27-29;# Zoladex/ Zometa today. Dr.B  All questions were answered. The patient knows to call the clinic with any problems, questions or concerns.    Cammie Sickle, MD 04/22/2019 12:45 PM

## 2019-04-19 NOTE — Assessment & Plan Note (Addendum)
#   breast cancer Stage III ER/PR pos her 2 NEG. currently on anastrazole+ Zoldadex.  No clinical evidence of recurrence.  STABLE.   #Continue current therapy; tolerating fairly well except for continued joint pains.  Zometa q 6 months; zometa today.   # Depression/ Insomnia- STABLE;  On cymblata-[ Dr.kapur]  # Joint pains- stable;   Sec to AI-  on vit D/continue Osteo Bi-Flex/Neurontin.  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  Stable.[name]  # DM-2 on insulin-;improved; but not wel controlled.  Defer to PCP  # DISPOSITION: # Zoladex  today.  # Follow up in 3 month;  MD;cbc/cmp/ca-27-29;# Zoladex/ Zometa today. Dr.B

## 2019-04-20 LAB — CANCER ANTIGEN 27.29: CA 27.29: 17.4 U/mL (ref 0.0–38.6)

## 2019-04-30 ENCOUNTER — Encounter: Payer: Self-pay | Admitting: Pain Medicine

## 2019-04-30 ENCOUNTER — Ambulatory Visit: Payer: 59 | Attending: Pain Medicine | Admitting: Pain Medicine

## 2019-04-30 ENCOUNTER — Other Ambulatory Visit: Payer: Self-pay

## 2019-04-30 VITALS — BP 110/79 | HR 86 | Temp 97.7°F | Resp 16 | Ht 64.0 in | Wt 180.0 lb

## 2019-04-30 DIAGNOSIS — M25561 Pain in right knee: Secondary | ICD-10-CM

## 2019-04-30 DIAGNOSIS — R200 Anesthesia of skin: Secondary | ICD-10-CM | POA: Insufficient documentation

## 2019-04-30 DIAGNOSIS — G5793 Unspecified mononeuropathy of bilateral lower limbs: Secondary | ICD-10-CM | POA: Insufficient documentation

## 2019-04-30 DIAGNOSIS — M5412 Radiculopathy, cervical region: Secondary | ICD-10-CM | POA: Insufficient documentation

## 2019-04-30 DIAGNOSIS — M1711 Unilateral primary osteoarthritis, right knee: Secondary | ICD-10-CM | POA: Insufficient documentation

## 2019-04-30 DIAGNOSIS — Z853 Personal history of malignant neoplasm of breast: Secondary | ICD-10-CM | POA: Insufficient documentation

## 2019-04-30 DIAGNOSIS — M792 Neuralgia and neuritis, unspecified: Secondary | ICD-10-CM | POA: Diagnosis present

## 2019-04-30 DIAGNOSIS — M542 Cervicalgia: Secondary | ICD-10-CM | POA: Diagnosis present

## 2019-04-30 DIAGNOSIS — G8929 Other chronic pain: Secondary | ICD-10-CM | POA: Diagnosis not present

## 2019-04-30 DIAGNOSIS — R202 Paresthesia of skin: Secondary | ICD-10-CM | POA: Diagnosis present

## 2019-04-30 DIAGNOSIS — M25562 Pain in left knee: Secondary | ICD-10-CM

## 2019-04-30 MED ORDER — SODIUM HYALURONATE (VISCOSUP) 20 MG/2ML IX SOSY: 2.0000 mL | PREFILLED_SYRINGE | Freq: Once | INTRA_ARTICULAR | Status: DC

## 2019-04-30 MED ORDER — ROPIVACAINE HCL 2 MG/ML IJ SOLN
5.0000 mL | Freq: Once | INTRAMUSCULAR | Status: AC
  Administered 2019-04-30: 5 mL via INTRA_ARTICULAR

## 2019-04-30 MED ORDER — SODIUM HYALURONATE (VISCOSUP) 20 MG/2ML IX SOSY
2.0000 mL | PREFILLED_SYRINGE | Freq: Once | INTRA_ARTICULAR | Status: AC
  Administered 2019-04-30: 2 mL via INTRA_ARTICULAR

## 2019-04-30 MED ORDER — LIDOCAINE HCL (PF) 1 % IJ SOLN
INTRAMUSCULAR | Status: AC
  Filled 2019-04-30: qty 5

## 2019-04-30 MED ORDER — PREGABALIN 150 MG PO CAPS: 150.0000 mg | ORAL_CAPSULE | Freq: Two times a day (BID) | ORAL | 2 refills | Status: DC

## 2019-04-30 MED ORDER — LIDOCAINE HCL (PF) 1 % IJ SOLN
5.0000 mL | Freq: Once | INTRAMUSCULAR | Status: AC
  Administered 2019-04-30: 5 mL

## 2019-04-30 MED ORDER — ROPIVACAINE HCL 2 MG/ML IJ SOLN
INTRAMUSCULAR | Status: AC
  Filled 2019-04-30: qty 10

## 2019-04-30 NOTE — Progress Notes (Signed)
PROVIDER NOTE: Information contained herein reflects review and annotations entered in association with encounter. Interpretation of such information and data should be left to medically-trained personnel. Information provided to patient can be located elsewhere in the medical record under "Patient Instructions". Document created using STT-dictation technology, any transcriptional errors that may result from process are unintentional.    Patient: Carolyn Roy  Service Category: Procedure  Provider: Gaspar Cola, MD  DOB: Jan 21, 1966  DOS: 04/30/2019  Location: [facility]  MRN: [id number]  Setting: Ambulatory - outpatient  Referring Provider: Milinda Pointer, MD  Type: Established Patient  Specialty: Interventional Pain Management  PCP: Guadalupe Maple, MD   Primary Reason for Visit: Interventional Pain Management Treatment. CC: Knee Pain (bilateral ) and Hand Pain (bilaterally bunring and numbness going up to the elbow )  Procedure:          Anesthesia, Analgesia, Anxiolysis:  Type: Therapeutic Intra-Articular Hyalgan Knee Injection #4  Region: Lateral infrapatellar Knee Region Level: Knee Joint Laterality: Right knee  Type: Local Anesthesia Indication(s): Analgesia         Local Anesthetic: Lidocaine 1-2% Route: Infiltration ([place]/IM) IV Access: Declined Sedation: Declined   Position: Sitting   Indications: 1. Chronic knee pain (Secondary Area of Pain) (Bilateral) (R>L)   2. Osteoarthritis of knee (Right)    Pain Score: Pre-procedure: 2 /10 Post-procedure: 0-No pain/10   Note: Today the patient comes into the clinics indicating that she has been experiencing some problems with her thumb, index finger, and middle finger going completely numb when she goes to bed at night.  She says that this numbness will wake her up in the middle of the night and it takes a while to get it to go away.  Based on the distribution of the patient's symptoms it would seem that  it is affecting her right C6 and C7 nerve roots.  I have reviewed the patient's chart and I cannot find any recent x-rays of the cervical spine.  The patient has confirmed that she also has been experiencing neck pain, shoulder pain, and upper back pain, all of which goes with a possible cervical radiculitis.  Today we will go ahead and order an x-ray of the cervical spine and once we get done with the treatment of her right knee, which has been improving significantly, then we will move onto this problem that she has been experiencing in the cervical region.  At this point, she refers that the symptoms are intermittent and does not seem to be affecting strength or causing a significant amount of pain.  She is simply very concerned about this new problem.  Pre-op Assessment:  Carolyn Roy is a 54 y.o. (year old), female patient, seen today for interventional treatment. She  has a past surgical history that includes Oophorectomy; Tubal ligation; Portacath placement (Right, 07/14/2017); Axillary lymph node biopsy (Left, 07/14/2017); Breast lumpectomy (Left, 01/12/2018); Breast biopsy (Left); Partial mastectomy with needle localization (Left, 01/12/2018); and Sentinel node biopsy (Left, 01/12/2018). Carolyn Roy has a current medication list which includes the following prescription(s): albuterol, anastrozole, aspirin-acetaminophen-caffeine, canagliflozin, duloxetine, glucose blood, goserelin, basaglar kwikpen, lidocaine-prilocaine, ondansetron, oxycodone, [START ON 05/22/2019] oxycodone, pregabalin, prochlorperazine, trazodone, citalopram, and oxycodone, and the following Facility-Administered Medications: heparin lock flush, sodium chloride flush, and sodium hyaluronate. Her primarily concern today is the Knee Pain (bilateral ) and Hand Pain (bilaterally bunring and numbness going up to the elbow )  Initial Vital Signs:  Pulse/HCG Rate: 86  Temp: 97.7 F (36.5 C)  Resp: 16 BP: 110/79 SpO2: 99 %  BMI: Estimated body  mass index is 30.9 kg/m as calculated from the following:   Height as of this encounter: 5\' 4"  (1.626 m).   Weight as of this encounter: 180 lb (81.6 kg).  Risk Assessment: Allergies: Reviewed. She is allergic to aspirin.  Allergy Precautions: None required Coagulopathies: Reviewed. None identified.  Blood-thinner therapy: None at this time Active Infection(s): Reviewed. None identified. Carolyn Roy is afebrile  Site Confirmation: Carolyn Roy was asked to confirm the procedure and laterality before marking the site Procedure checklist: Completed Consent: Before the procedure and under the influence of no sedative(s), amnesic(s), or anxiolytics, the patient was informed of the treatment options, risks and possible complications. To fulfill our ethical and legal obligations, as recommended by the American Medical Association's Code of Ethics, I have informed the patient of my clinical impression; the nature and purpose of the treatment or procedure; the risks, benefits, and possible complications of the intervention; the alternatives, including doing nothing; the risk(s) and benefit(s) of the alternative treatment(s) or procedure(s); and the risk(s) and benefit(s) of doing nothing. The patient was provided information about the general risks and possible complications associated with the procedure. These may include, but are not limited to: failure to achieve desired goals, infection, bleeding, organ or nerve damage, allergic reactions, paralysis, and death. In addition, the patient was informed of those risks and complications associated to the procedure, such as failure to decrease pain; infection; bleeding; organ or nerve damage with subsequent damage to sensory, motor, and/or autonomic systems, resulting in permanent pain, numbness, and/or weakness of one or several areas of the body; allergic reactions; (i.e.: anaphylactic reaction); and/or death. Furthermore, the patient was informed of those risks  and complications associated with the medications. These include, but are not limited to: allergic reactions (i.e.: anaphylactic or anaphylactoid reaction(s)); adrenal axis suppression; blood sugar elevation that in diabetics may result in ketoacidosis or comma; water retention that in patients with history of congestive heart failure may result in shortness of breath, pulmonary edema, and decompensation with resultant heart failure; weight gain; swelling or edema; medication-induced neural toxicity; particulate matter embolism and blood vessel occlusion with resultant organ, and/or nervous system infarction; and/or aseptic necrosis of one or more joints. Finally, the patient was informed that Medicine is not an exact science; therefore, there is also the possibility of unforeseen or unpredictable risks and/or possible complications that may result in a catastrophic outcome. The patient indicated having understood very clearly. We have given the patient no guarantees and we have made no promises. Enough time was given to the patient to ask questions, all of which were answered to the patient's satisfaction. Carolyn Roy has indicated that she wanted to continue with the procedure. Attestation: I, the ordering provider, attest that I have discussed with the patient the benefits, risks, side-effects, alternatives, likelihood of achieving goals, and potential problems during recovery for the procedure that I have provided informed consent. Date  Time: 04/30/2019 10:28 AM  Pre-Procedure Preparation:  Monitoring: As per clinic protocol. Respiration, ETCO2, SpO2, BP, heart rate and rhythm monitor placed and checked for adequate function Safety Precautions: Patient was assessed for positional comfort and pressure points before starting the procedure. Time-out: I initiated and conducted the "Time-out" before starting the procedure, as per protocol. The patient was asked to participate by confirming the accuracy of the  "Time Out" information. Verification of the correct person, site, and procedure were performed and confirmed by me, the nursing  staff, and the patient. "Time-out" conducted as per Joint Commission's Universal Protocol (UP.01.01.01). Time: 1037  Description of Procedure:          Target Area: Knee Joint Approach: Just above the Lateral tibial plateau, lateral to the infrapatellar tendon. Area Prepped: Entire knee area, from the mid-thigh to the mid-shin. Prepping solution: DuraPrep (Iodine Povacrylex [0.7% available iodine] and Isopropyl Alcohol, 74% w/w) Safety Precautions: Aspiration looking for blood return was conducted prior to all injections. At no point did we inject any substances, as a needle was being advanced. No attempts were made at seeking any paresthesias. Safe injection practices and needle disposal techniques used. Medications properly checked for expiration dates. SDV (single dose vial) medications used. Description of the Procedure: Protocol guidelines were followed. The patient was placed in position over the fluoroscopy table. The target area was identified and the area prepped in the usual manner. Skin & deeper tissues infiltrated with local anesthetic. Appropriate amount of time allowed to pass for local anesthetics to take effect. The procedure needles were then advanced to the target area. Proper needle placement secured. Negative aspiration confirmed. Solution injected in intermittent fashion, asking for systemic symptoms every 0.5cc of injectate. The needles were then removed and the area cleansed, making sure to leave some of the prepping solution back to take advantage of its long term bactericidal properties. Vitals:   04/30/19 1015  BP: 110/79  Pulse: 86  Resp: 16  Temp: 97.7 F (36.5 C)  TempSrc: Temporal  SpO2: 99%  Weight: 180 lb (81.6 kg)  Height: 5\' 4"  (1.626 m)    Start Time: 1037 hrs. End Time: 1039 hrs. Materials:  Needle(s) Type: Regular needle Gauge:  25G Length: 1.5-in Medication(s): Please see orders for medications and dosing details.  Imaging Guidance:          Type of Imaging Technique: None used Indication(s): N/A Exposure Time: No patient exposure Contrast: None used. Fluoroscopic Guidance: N/A Ultrasound Guidance: N/A Interpretation: N/A  Antibiotic Prophylaxis:   Anti-infectives (From admission, onward)   None     Indication(s): None identified  Post-operative Assessment:  Post-procedure Vital Signs:  Pulse/HCG Rate: 86  Temp: 97.7 F (36.5 C) Resp: 16 BP: 110/79 SpO2: 99 %  EBL: None  Complications: No immediate post-treatment complications observed by team, or reported by patient.  Note: The patient tolerated the entire procedure well. A repeat set of vitals were taken after the procedure and the patient was kept under observation following institutional policy, for this type of procedure. Post-procedural neurological assessment was performed, showing return to baseline, prior to discharge. The patient was provided with post-procedure discharge instructions, including a section on how to identify potential problems. Should any problems arise concerning this procedure, the patient was given instructions to immediately contact us, at any time, without hesitation. In any case, we plan to contact the patient by telephone for a follow-up status report regarding this interventional procedure.  Comments:  No additional relevant information.  Plan of Care  Orders:  Orders Placed This Encounter  Procedures  . KNEE INJECTION    Hyalgan knee injection to be done by MD.    Scheduling Instructions:     Procedure: Intra-articular Hyalgan Knee injection #4     Side(s): Right Knee     Sedation: None     Timeframe: Today    Order Specific Question:   Where will this procedure be performed?    Answer:   [facility]  . KNEE INJECTION  Hyalgan knee injection. Please order Hyalgan.    Standing Status:   Future     Standing Expiration Date:   05/31/2019    Scheduling Instructions:     Procedure: Intra-articular Hyalgan Knee injection #5     Side: Right-sided     Sedation: None     Timeframe: in two (2) weeks    Order Specific Question:   Where will this procedure be performed?    Answer:   [facility]  . DG Cervical Spine With Flex & Extend    Patient presents with axial pain with possible radicular component.  In addition to any acute findings, please report on:  1. Facet (Zygapophyseal) joint DJD (Hypertrophy, space narrowing, subchondral sclerosis, and/or osteophyte formation) 2. DDD and/or IVDD (Loss of disc height, desiccation or "Black disc disease") 3. Pars defects 4. Spondylolisthesis, spondylosis, and/or spondyloarthropathies (include Degree/Grade of displacement in mm) 5. Vertebral body Fractures, including age (old, new/acute) 67. Modic Type Changes 7. Demineralization 8. Bone pathology 9. Central, Lateral Recess, and/or Foraminal Stenosis (include AP diameter of stenosis in mm) 10. Surgical changes (hardware type, status, and presence of fibrosis) NOTE: Please specify level(s) and laterality. If applicable: Please indicate ROM and/or evidence of instability (>22mm displacement between flexion and extension views)    Standing Status:   Future    Standing Expiration Date:   07/31/2019    Scheduling Instructions:     Please evaluate for any evidence of cervical spine instability. Describe the presence of any spondylolisthesis (Antero- or retrolisthesis). If present, provide displacement "Grade" and measurement in cm. Please describe presence and specific location (Level & Laterality) of any signs of      osteoarthritis, zygapophyseal (Facet) joints DJD (including decreased joint space and/or osteophytosis), DDD, Foraminal narrowing, as well as any sclerosis and/or cyst formation. Please comment on ROM.    Order Specific Question:   Reason for Exam (SYMPTOM  OR DIAGNOSIS REQUIRED)     Answer:   Cervicalgia    Order Specific Question:   Is patient pregnant?    Answer:   No    Order Specific Question:   Preferred imaging location?    Answer:   [facility]    Order Specific Question:   Call Results- Best Contact Number?    Answer:   [phone] ([facility])    Order Specific Question:   Radiology Contrast Protocol - do NOT remove file path    Answer:   [email or web address]  . Informed Consent Details: Physician/Practitioner Attestation; Transcribe to consent form and obtain patient signature    Provider Attestation: I, Almont Dossie Arbour, MD, (Pain Management Specialist), the physician/practitioner, attest that I have discussed with the patient the benefits, risks, side effects, alternatives, likelihood of achieving goals and potential problems during recovery for the procedure that I have provided informed consent.    Scheduling Instructions:     Procedure: Therapeutic, right sided, intra-articular Hyalgan knee injection     Indications: Chronic right knee pain secondary to primary osteoarthritis of the knee     Note: Always confirm laterality of pain with Ms. White, before procedure.     Transcribe to consent form and obtain patient signature.  . Provide equipment / supplies at bedside    Equipment required: Single use, disposable, "Block Tray"    Standing Status:   Standing    Number of Occurrences:   1    Order Specific Question:   Specify    Answer:   Block Tray   Chronic  Opioid Analgesic:  Oxycodone IR 5 mg, 1 tab PO BID (10 mg/day of oxycodone) (Average: 2 tabs/day = 10 mg/day of oxycodone) MME/day: 15 mg/day.   Medications ordered for procedure: Meds ordered this encounter  Medications  . lidocaine (PF) (XYLOCAINE) 1 % injection 5 mL  . ropivacaine (PF) 2 mg/mL (0.2%) (NAROPIN) injection 5 mL  . Sodium Hyaluronate SOSY 2 mL  . Sodium Hyaluronate SOSY 2 mL  . pregabalin (LYRICA) 150 MG capsule    Sig:  Take 1 capsule (150 mg total) by mouth 2 (two) times daily.    Dispense:  60 capsule    Refill:  2    Fill one day early if pharmacy is closed on scheduled refill date. May substitute for generic if available.   Medications administered: We administered lidocaine (PF), ropivacaine (PF) 2 mg/mL (0.2%), and Sodium Hyaluronate.  See the medical record for exact dosing, route, and time of administration.  Follow-up plan:   Return in about 2 weeks (around 05/14/2019) for Procedure (no sedation): (R) Hyalgan #5.       Considering:   Possible bilateral lumbar facet RFA  Diagnostic lumbar sympathetic block  Diagnostic right knee Hyalgan series #1  Diagnostic right genicular NB  Possible right genicular nerve RFA    Palliative PRN treatment(s):   Therapeutic right IA knee joint injection #3 (steroids)  Diagnostic bilateral lumbar facet block #2         Recent Visits Date Type Provider Dept  04/16/19 Procedure visit Milinda Pointer, MD [facility]  04/02/19 Procedure visit Milinda Pointer, MD [facility]  03/19/19 Procedure visit Milinda Pointer, MD [facility]  03/13/19 Telemedicine Milinda Pointer, MD [facility]  Showing recent visits within past 90 days and meeting all other requirements   Today's Visits Date Type Provider Dept  04/30/19 Procedure visit Milinda Pointer, MD [facility]  Showing today's visits and meeting all other requirements   Future Appointments Date Type Provider Dept  06/19/19 Appointment Milinda Pointer, MD [facility]  Showing future appointments within next 90 days and meeting all other requirements   Disposition: Discharge home  Discharge (Date  Time): 04/30/2019; 1039 hrs.   Primary Care Physician: Guadalupe Maple, MD Location: [facility] Note by: Gaspar Cola, MD Date: 04/30/2019; Time: 11:28 AM  Disclaimer:  Medicine is not an Armed forces logistics/support/administrative officer. The only guarantee in medicine is that nothing is guaranteed. It is important to note that the decision to proceed with this intervention was based on the information collected from the patient. The Data and conclusions were drawn from the patient's questionnaire, the interview, and the physical examination. Because the information was provided in large part by the patient, it cannot be guaranteed that it has not been purposely or unconsciously manipulated. Every effort has been made to obtain as much relevant data as possible for this evaluation. It is important to note that the conclusions that lead to this procedure are derived in large part from the available data. Always take into account that the treatment will also be dependent on availability of resources and existing treatment guidelines, considered by other Pain Management Practitioners as being common knowledge and practice, at the time of the intervention. For Medico-Legal purposes, it is also important to point out that variation in procedural techniques and pharmacological choices are the acceptable norm. The indications, contraindications, technique, and results of the above procedure should only be interpreted and judged by a Board-Certified Interventional  Pain Specialist with extensive familiarity and expertise in the same exact procedure and technique.

## 2019-04-30 NOTE — Patient Instructions (Signed)
____________________________________________________________________________________________  Post-Procedure Discharge Instructions  Instructions:  Apply ice:   Purpose: This will minimize any swelling and discomfort after procedure.   When: Day of procedure, as soon as you get home.  How: Fill a plastic sandwich bag with crushed ice. Cover it with a small towel and apply to injection site.  How long: (15 min on, 15 min off) Apply for 15 minutes then remove x 15 minutes.  Repeat sequence on day of procedure, until you go to bed.  Apply heat:   Purpose: To treat any soreness and discomfort from the procedure.  When: Starting the next day after the procedure.  How: Apply heat to procedure site starting the day following the procedure.  How long: May continue to repeat daily, until discomfort goes away.  Food intake: Start with clear liquids (like water) and advance to regular food, as tolerated.   Physical activities: Keep activities to a minimum for the first 8 hours after the procedure. After that, then as tolerated.  Driving: If you have received any sedation, be responsible and do not drive. You are not allowed to drive for 24 hours after having sedation.  Blood thinner: (Applies only to those taking blood thinners) You may restart your blood thinner 6 hours after your procedure.  Insulin: (Applies only to Diabetic patients taking insulin) As soon as you can eat, you may resume your normal dosing schedule.  Infection prevention: Keep procedure site clean and dry. Shower daily and clean area with soap and water.  Post-procedure Pain Diary: Extremely important that this be done correctly and accurately. Recorded information will be used to determine the next step in treatment. For the purpose of accuracy, follow these rules:  Evaluate only the area treated. Do not report or include pain from an untreated area. For the purpose of this evaluation, ignore all other areas of pain,  except for the treated area.  After your procedure, avoid taking a long nap and attempting to complete the pain diary after you wake up. Instead, set your alarm clock to go off every hour, on the hour, for the initial 8 hours after the procedure. Document the duration of the numbing medicine, and the relief you are getting from it.  Do not go to sleep and attempt to complete it later. It will not be accurate. If you received sedation, it is likely that you were given a medication that may cause amnesia. Because of this, completing the diary at a later time may cause the information to be inaccurate. This information is needed to plan your care.  Follow-up appointment: Keep your post-procedure follow-up evaluation appointment after the procedure (usually 2 weeks for most procedures, 6 weeks for radiofrequencies). DO NOT FORGET to bring you pain diary with you.   Expect: (What should I expect to see with my procedure?)  From numbing medicine (AKA: Local Anesthetics): Numbness or decrease in pain. You may also experience some weakness, which if present, could last for the duration of the local anesthetic.  Onset: Full effect within 15 minutes of injected.  Duration: It will depend on the type of local anesthetic used. On the average, 1 to 8 hours.   From steroids (Applies only if steroids were used): Decrease in swelling or inflammation. Once inflammation is improved, relief of the pain will follow.  Onset of benefits: Depends on the amount of swelling present. The more swelling, the longer it will take for the benefits to be seen. In some cases, up to 10 days.    Duration: Steroids will stay in the system x 2 weeks. Duration of benefits will depend on multiple posibilities including persistent irritating factors.  Side-effects: If present, they may typically last 2 weeks (the duration of the steroids).  Frequent: Cramps (if they occur, drink Gatorade and take over-the-counter Magnesium 450-500 mg  once to twice a day); water retention with temporary weight gain; increases in blood sugar; decreased immune system response; increased appetite.  Occasional: Facial flushing (red, warm cheeks); mood swings; menstrual changes.  Uncommon: Long-term decrease or suppression of natural hormones; bone thinning. (These are more common with higher doses or more frequent use. This is why we prefer that our patients avoid having any injection therapies in other practices.)   Very Rare: Severe mood changes; psychosis; aseptic necrosis.  From procedure: Some discomfort is to be expected once the numbing medicine wears off. This should be minimal if ice and heat are applied as instructed.  Call if: (When should I call?)  You experience numbness and weakness that gets worse with time, as opposed to wearing off.  New onset bowel or bladder incontinence. (Applies only to procedures done in the spine)  Emergency Numbers:  Durning business hours (Monday - Thursday, 8:00 AM - 4:00 PM) (Friday, 9:00 AM - 12:00 Noon): [phone]  After hours: [phone]  NOTE: If you are having a problem and are unable connect with, or to talk to a provider, then go to your nearest urgent care or emergency department. If the problem is serious and urgent, please call 911. ____________________________________________________________________________________________   ____________________________________________________________________________________________  Preparing for your procedure (without sedation)  Procedure appointments are limited to planned procedures: . No Prescription Refills. . No disability issues will be discussed. . No medication changes will be discussed.  Instructions: . Oral Intake: Do not eat or drink anything for at least 3 hours prior to your procedure. . Transportation: Unless otherwise stated by your physician, you may drive yourself after the procedure. . Blood Pressure Medicine:  Take your blood pressure medicine with a sip of water the morning of the procedure. . Blood thinners: Notify our staff if you are taking any blood thinners. Depending on which one you take, there will be specific instructions on how and when to stop it. . Diabetics on insulin: Notify the staff so that you can be scheduled 1st case in the morning. If your diabetes requires high dose insulin, take only  of your normal insulin dose the morning of the procedure and notify the staff that you have done so. . Preventing infections: Shower with an antibacterial soap the morning of your procedure.  . Build-up your immune system: Take 1000 mg of Vitamin C with every meal (3 times a day) the day prior to your procedure. . Antibiotics: Inform the staff if you have a condition or reason that requires you to take antibiotics before dental procedures. . Pregnancy: If you are pregnant, call and cancel the procedure. . Sickness: If you have a cold, fever, or any active infections, call and cancel the procedure. . Arrival: You must be in the facility at least 30 minutes prior to your scheduled procedure. . Children: Do not bring any children with you. . Dress appropriately: Bring dark clothing that you would not mind if they get stained. . Valuables: Do not bring any jewelry or valuables.  Reasons to call and reschedule or cancel your procedure: (Following these recommendations will minimize the risk of a serious complication.) . Surgeries: Avoid having procedures within 2 weeks   of any surgery. (Avoid for 2 weeks before or after any surgery). . Flu Shots: Avoid having procedures within 2 weeks of a flu shots or . (Avoid for 2 weeks before or after immunizations). . Barium: Avoid having a procedure within 7-10 days after having had a radiological study involving the use of radiological contrast. (Myelograms, Barium swallow or enema study). . Heart attacks: Avoid any elective procedures or surgeries for the initial 6  months after a "Myocardial Infarction" (Heart Attack). . Blood thinners: It is imperative that you stop these medications before procedures. Let us know if you if you take any blood thinner.  . Infection: Avoid procedures during or within two weeks of an infection (including chest colds or gastrointestinal problems). Symptoms associated with infections include: Localized redness, fever, chills, night sweats or profuse sweating, burning sensation when voiding, cough, congestion, stuffiness, runny nose, sore throat, diarrhea, nausea, vomiting, cold or Flu symptoms, recent or current infections. It is specially important if the infection is over the area that we intend to treat. . Heart and lung problems: Symptoms that may suggest an active cardiopulmonary problem include: cough, chest pain, breathing difficulties or shortness of breath, dizziness, ankle swelling, uncontrolled high or unusually low blood pressure, and/or palpitations. If you are experiencing any of these symptoms, cancel your procedure and contact your primary care physician for an evaluation.  Remember:  Regular Business hours are:  Monday to Thursday 8:00 AM to 4:00 PM  Provider's Schedule: Veida Spira, MD:  Procedure days: Tuesday and Thursday 7:30 AM to 4:00 PM  Bilal Lateef, MD:  Procedure days: Monday and Wednesday 7:30 AM to 4:00 PM ____________________________________________________________________________________________     

## 2019-05-01 ENCOUNTER — Telehealth: Payer: Self-pay

## 2019-05-01 ENCOUNTER — Telehealth: Payer: Self-pay | Admitting: *Deleted

## 2019-05-01 NOTE — Telephone Encounter (Signed)
No problems post procedure. 

## 2019-05-03 ENCOUNTER — Telehealth: Payer: Self-pay | Admitting: *Deleted

## 2019-06-03 ENCOUNTER — Other Ambulatory Visit: Payer: Self-pay

## 2019-06-03 ENCOUNTER — Ambulatory Visit (INDEPENDENT_AMBULATORY_CARE_PROVIDER_SITE_OTHER): Payer: No Typology Code available for payment source | Admitting: Nurse Practitioner

## 2019-06-03 ENCOUNTER — Encounter: Payer: Self-pay | Admitting: Nurse Practitioner

## 2019-06-03 VITALS — BP 92/57 | HR 87 | Temp 98.0°F | Wt 179.4 lb

## 2019-06-03 DIAGNOSIS — E785 Hyperlipidemia, unspecified: Secondary | ICD-10-CM | POA: Diagnosis not present

## 2019-06-03 DIAGNOSIS — E1165 Type 2 diabetes mellitus with hyperglycemia: Secondary | ICD-10-CM | POA: Diagnosis not present

## 2019-06-03 DIAGNOSIS — E1169 Type 2 diabetes mellitus with other specified complication: Secondary | ICD-10-CM | POA: Diagnosis not present

## 2019-06-03 DIAGNOSIS — Z794 Long term (current) use of insulin: Secondary | ICD-10-CM | POA: Diagnosis not present

## 2019-06-03 LAB — BAYER DCA HB A1C WAIVED: HB A1C (BAYER DCA - WAIVED): 9.3 % — ABNORMAL HIGH (ref <7.0)

## 2019-06-03 MED ORDER — ATORVASTATIN CALCIUM 10 MG PO TABS: 10.0000 mg | ORAL_TABLET | Freq: Every day | ORAL | 3 refills | Status: DC

## 2019-06-03 NOTE — Assessment & Plan Note (Signed)
Chronic, ongoing with A1C 9.3% today, trending down but not to goal.  Poor diet at home.  Previously followed by endo, but prefers not to return at this time. Continue Invokana and Basaglar at 10 units.  She would like to come off insulin in future, could try GLP.  Will reach out to [facility] Pharm[facility] and recommended patient ensure to answer or return calls to [facility] team, as cost is a concern for her with medications.  Will work with CCM team on initiating GLP1.  Recommend continue to monitor BS at home BID and heavily focus on diet changes.  Return in 3 months, sooner if medication changes made.

## 2019-06-03 NOTE — Patient Instructions (Signed)

## 2019-06-03 NOTE — Assessment & Plan Note (Signed)
Chronic, ongoing.  Last LDL elevated at 140.  Will restart Atorvastatin, initially 10 MG daily.  Script sent in.  Lipid panel today and then recheck next visit with medication on board.

## 2019-06-03 NOTE — Progress Notes (Signed)
BP (!) 92/57 (BP Location: Right Arm, Cuff Size: Normal)   Pulse 87   Temp 98 F (36.7 C) (Oral)   Wt 179 lb 6.4 oz (81.4 kg)   LMP  (LMP Unknown) Comment: LAST PERIOD IN MAY 2019 WHEN SHE STARTED CHEMO  SpO2 92%   BMI 30.79 kg/m    Subjective:    Patient ID: Carolyn Roy, female    DOB: 1965-03-21, 54 y.o.   MRN: [id number]  HPI: Carolyn Roy is a 54 y.o. female  Chief Complaint  Patient presents with  . Diabetes  . Hyperlipidemia  . Hypertension   DIABETES Last A1C January 10.8%.  Continues to be followed by oncology for malignant neoplasm of left breast.  Last seen by endocrinology in April 2020, as had family loss and was going through chemo was unable to attend further appointment.  Is prescribed Basaglar 10 units. Continues on Invokana 300 MG.  Has tried [place] in past, but insurance stopped covering. Has tried Metformin, which she no longer wishes to take due to breast cancer, she read online this could cause breast CA.  Tried Ozempic in past with benefit -- was given sample by endocrinology.  Is interested in CCM team, but reports she did not hear from them after last visit, reviewed referral with her and appears they attempted to call.  She is a smoker, 1/2 to 1 PPD.  Quit for 6 years, but then started back.   Hypoglycemic episodes:no Polydipsia/polyuria: no Visual disturbance: no Chest pain: no Paresthesias: no Glucose Monitoring: yes  Accucheck frequency: BID  Fasting glucose: 98 range  Post prandial:  Evening:   Before meals: 230 to 240  Taking Insulin?: yes  Long acting insulin: 10 units  Short acting insulin: Blood Pressure Monitoring: not checking Retinal Examination: Up To Date -- went last Friday -- [facility] Foot Exam: Up to Date Pneumovax: provided today Influenza: Not up to Date Aspirin: no   HYPERLIPIDEMIA No current medications for HTN.  Took Atorvastatin in past, but due to chemo had to cut this out.   Satisfied with current  treatment? No current treatment Duration of hyperlipidemia: chronic Cholesterol medication side effects: no Cholesterol supplements: none Aspirin: no Recent stressors: no Recurrent headaches: no Visual changes: no Palpitations: no Dyspnea: no Chest pain: no Lower extremity edema: no Dizzy/lightheaded: no  The 10-year ASCVD risk score Carolyn Roy., et al., 2013) is: 4.9%   Values used to calculate the score:     Age: 75 years     Sex: Female     Is Non-Hispanic African American: No     Diabetic: Yes     Tobacco smoker: Yes     Systolic Blood Pressure: 92 mmHg     Is BP treated: No     HDL Cholesterol: 50 mg/dL     Total Cholesterol: 223 mg/dL  Relevant past medical, surgical, family and social history reviewed and updated as indicated. Interim medical history since our last visit reviewed. Allergies and medications reviewed and updated.  Review of Systems  Constitutional: Negative for activity change, appetite change, diaphoresis, fatigue and fever.  Respiratory: Negative for cough, chest tightness and shortness of breath.   Cardiovascular: Negative for chest pain, palpitations and leg swelling.  Gastrointestinal: Negative.   Endocrine: Negative for cold intolerance, heat intolerance, polydipsia, polyphagia and polyuria.  Neurological: Negative.   Psychiatric/Behavioral: Negative.     Per HPI unless specifically indicated above     Objective:    BP [name])  92/57 (BP Location: Right Arm, Cuff Size: Normal)   Pulse 87   Temp 98 F (36.7 C) (Oral)   Wt 179 lb 6.4 oz (81.4 kg)   LMP  (LMP Unknown) Comment: LAST PERIOD IN MAY 2019 WHEN SHE STARTED CHEMO  SpO2 92%   BMI 30.79 kg/m   Wt Readings from Last 3 Encounters:  06/03/19 179 lb 6.4 oz (81.4 kg)  04/30/19 180 lb (81.6 kg)  04/19/19 180 lb (81.6 kg)    Physical Exam Vitals and nursing note reviewed.  Constitutional:      General: She is awake.     Appearance: She is well-developed and well-groomed.  HENT:      Head: Normocephalic.     Right Ear: Hearing normal.     Left Ear: Hearing normal.  Eyes:     General: Lids are normal.        Right eye: No discharge.        Left eye: No discharge.     Conjunctiva/sclera: Conjunctivae normal.     Pupils: Pupils are equal, round, and reactive to light.  Neck:     Thyroid: No thyromegaly.     Vascular: No carotid bruit.  Cardiovascular:     Rate and Rhythm: Normal rate and regular rhythm.     Heart sounds: Normal heart sounds. No murmur. No gallop.   Pulmonary:     Effort: Pulmonary effort is normal. No accessory muscle usage or respiratory distress.     Breath sounds: Normal breath sounds.  Abdominal:     General: Bowel sounds are normal.     Palpations: Abdomen is soft.  Musculoskeletal:     Cervical back: Normal range of motion and neck supple.     Right lower leg: No edema.     Left lower leg: No edema.  Skin:    General: Skin is warm and dry.  Neurological:     Mental Status: She is alert and oriented to person, place, and time.  Psychiatric:        Attention and Perception: Attention normal.        Mood and Affect: Mood normal.        Speech: Speech normal.        Behavior: Behavior normal. Behavior is cooperative.        Thought Content: Thought content normal.    Results for orders placed or performed in visit on 06/03/19  Bayer DCA Hb A1c Waived  Result Value Ref Range   HB A1C (BAYER DCA - WAIVED) 9.3 (H) <7.0 %      Assessment & Plan:   Problem List Items Addressed This Visit      Endocrine   Hyperlipidemia associated with type 2 diabetes mellitus ([place]) - Primary    Chronic, ongoing.  Last LDL elevated at 140.  Will restart Atorvastatin, initially 10 MG daily.  Script sent in.  Lipid panel today and then recheck next visit with medication on board.        Relevant Medications   atorvastatin (LIPITOR) 10 MG tablet   Other Relevant Orders   Lipid Panel w/o Chol/HDL Ratio   Type 2 diabetes mellitus with hyperglycemia,  with long-term current use of insulin (HCC)    Chronic, ongoing with A1C 9.3% today, trending down but not to goal.  Poor diet at home.  Previously followed by endo, but prefers not to return at this time. Continue Invokana and Basaglar at 10 units.  She would like to come off  insulin in future, could try GLP.  Will reach out to [facility] PharmD and recommended patient ensure to answer or return calls to [facility] team, as cost is a concern for her with medications.  Will work with CCM team on initiating GLP1.  Recommend continue to monitor BS at home BID and heavily focus on diet changes.  Return in 3 months, sooner if medication changes made.      Relevant Medications   atorvastatin (LIPITOR) 10 MG tablet   Other Relevant Orders   Bayer DCA Hb A1c Waived (Completed)   Basic metabolic panel       Follow up plan: Return in about 3 months (around 09/02/2019) for T2DM, HTN/HLD.

## 2019-06-04 ENCOUNTER — Telehealth: Payer: Self-pay

## 2019-06-04 ENCOUNTER — Ambulatory Visit: Payer: Self-pay | Admitting: Pharmacist

## 2019-06-04 LAB — LIPID PANEL W/O CHOL/HDL RATIO
Cholesterol, Total: 198 mg/dL (ref 100–199)
HDL: 45 mg/dL (ref >39)
LDL Chol Calc (NIH): 129 mg/dL — ABNORMAL HIGH (ref 0–99)
Triglycerides: 135 mg/dL (ref 0–149)
VLDL Cholesterol Cal: 24 mg/dL (ref 5–40)

## 2019-06-04 LAB — BASIC METABOLIC PANEL
BUN/Creatinine Ratio: 13 (ref 9–23)
BUN: 10 mg/dL (ref 6–24)
CO2: 24 mmol/L (ref 20–29)
Calcium: 9.2 mg/dL (ref 8.7–10.2)
Chloride: 101 mmol/L (ref 96–106)
Creatinine, Ser: 0.8 mg/dL (ref 0.57–1.00)
GFR calc Af Amer: 99 mL/min/{1.73_m2} (ref >59)
GFR calc non Af Amer: 86 mL/min/{1.73_m2} (ref >59)
Glucose: 339 mg/dL — ABNORMAL HIGH (ref 65–99)
Potassium: 3.7 mmol/L (ref 3.5–5.2)
Sodium: 140 mmol/L (ref 134–144)

## 2019-06-04 NOTE — Progress Notes (Signed)
Please contact patient via phone (do not think she checks MyChart) and alert her labs have returned.  Her glucose was elevated on labs, but kidney function stable.  Cholesterol levels do show elevation in LDL, she would benefit from increase in Atorvastatin to 20 MG, if agrees with this let me know.  The chronic care management pharmacist did try to call her today, 06/04/19, and left message -- please call her back.  She recommends sending in Ozempic prescription as it should be covered by insurance, but if it is not she can assist with coupon.  Please give her a call and let me know if you are okay with sending in [place].  Thank you.  Have a great day!!

## 2019-06-04 NOTE — Chronic Care Management (AMB) (Signed)
  Chronic Care Management   Note  06/04/2019 Name: Carolyn Roy MRN: [id number] DOB: Sep 18, 1965  Carolyn Roy is a 54 y.o. year old female who is a primary care patient of Crissman, Jeannette How, MD. The CCM team was consulted for assistance with chronic disease management and care coordination needs.    Attempted to contact patient for medication management review. Left HIPAA compliant message for patient to return my call at their convenience.   Plan: - Will attempt outreach again over the next week  Catie Darnelle Maffucci, PharmD, [place] [phone]

## 2019-06-04 NOTE — Addendum Note (Signed)
Addended by: Marnee Guarneri T on: 06/04/2019 03:40 PM   Modules accepted: Orders

## 2019-06-05 ENCOUNTER — Other Ambulatory Visit: Payer: Self-pay | Admitting: Nurse Practitioner

## 2019-06-05 MED ORDER — ATORVASTATIN CALCIUM 20 MG PO TABS: 20.0000 mg | ORAL_TABLET | Freq: Every day | ORAL | 3 refills | Status: DC

## 2019-06-05 NOTE — Progress Notes (Signed)
Atorvastatin increase to 20 MG daily

## 2019-06-07 ENCOUNTER — Ambulatory Visit: Payer: Self-pay | Admitting: Pharmacist

## 2019-06-07 NOTE — Chronic Care Management (AMB) (Addendum)
  Chronic Care Management   Note  06/07/2019 Name: Carolyn Roy MRN: [id number] DOB: [birth year]  [name] is a 54 y.o. year old female who is a primary care patient of Crissman, Jeannette How, MD. The CCM team was consulted for assistance with chronic disease management and care coordination needs.    Attempted to contact patient for medication management review. Left HIPAA compliant message for patient to return my call at their convenience.  Addendum- heard back from patient. Scheduled phone call in ~4 weeks for full med review.    Catie Darnelle Maffucci, PharmD, [place] [phone]

## 2019-06-11 ENCOUNTER — Ambulatory Visit: Payer: Self-pay | Admitting: General Practice

## 2019-06-11 NOTE — Chronic Care Management (AMB) (Signed)
  Chronic Care Management   Outreach Note  06/11/2019 Name: Carolyn Roy MRN: [id number] DOB: [birth year]  Referred by: Guadalupe Maple, MD Reason for referral : Care Coordination (Initial outreach: DM2/HLD- other needs)   An unsuccessful telephone outreach was attempted today. The patient was referred to the case management team for assistance with care management and care coordination.   Follow Up Plan: A HIPPA compliant phone message was left for the patient providing contact information and requesting a return call.   Noreene Larsson RN, MSN, [facility] Mobile: [phone]

## 2019-06-17 NOTE — Progress Notes (Signed)
Patient: Carolyn Roy  Service Category: E/M  Provider: Gaspar Cola, MD  DOB: Nov 20, 1965  DOS: 06/19/2019  Location: Office  MRN: [id number]  Setting: Ambulatory outpatient  Referring Provider: Guadalupe Maple, MD  Type: Established Patient  Specialty: Interventional Pain Management  PCP: Guadalupe Maple, MD  Location: Remote location  Delivery: TeleHealth     Virtual Encounter - Pain Management PROVIDER NOTE: Information contained herein reflects review and annotations entered in association with encounter. Interpretation of such information and data should be left to medically-trained personnel. Information provided to patient can be located elsewhere in the medical record under "Patient Instructions". Document created using STT-dictation technology, any transcriptional errors that may result from process are unintentional.    Contact & Pharmacy Preferred: [phone] Home: [phone] (home) Mobile: [phone] (mobile) E-mail: [email or web address]  [id number] [place] - [id number]: [phone]: [phone]  Pre-screening  Ms. White offered "in-person" vs "virtual" encounter. She indicated preferring virtual for this encounter.   Reason COVID-19*  Social distancing based on CDC and AMA recommendations.   I contacted CPayton Emeraldon 06/19/2019 via telephone.      I clearly identified myself as FGaspar Cola MD. I verified that I was speaking with the correct person using two identifiers (Name: CORLY QUIMBY and date of birth: 614-Sep-1967.  Consent I sought verbal advanced consent from CPayton Emeraldfor virtual visit interactions. I informed Ms. White of possible security and privacy concerns, risks, and limitations associated with providing "not-in-person" medical evaluation and management services. I also informed Ms. White of the availability of "in-person" appointments. Finally, I informed  her that there would be a charge for the virtual visit and that she could be  personally, fully or partially, financially responsible for it. Ms. WDema Severinexpressed understanding and agreed to proceed.   Historic Elements   Ms. CMOSELLE RISTERis a 54y.o. year old, female patient evaluated today after her last contact with our practice on 05/03/2019. Ms. White  has a past medical history of [place], [place]), Depression, [place] mellitus without complication ([place] ([address], Endometriosis, Family history of breast cancer, Headache, Hyperlipidemia, Ovarian mass, Personal history of chemotherapy, Personal history of radiation therapy, and Pneumonia. She also  has a past surgical history that includes Oophorectomy; Tubal ligation; Portacath placement (Right, 07/14/2017); Axillary lymph node biopsy (Left, 07/14/2017); Breast lumpectomy (Left, 01/12/2018); Breast biopsy (Left); Partial mastectomy with needle localization (Left, 01/12/2018); and Sentinel node biopsy (Left, 01/12/2018). Ms. WDema Severinhas a current medication list which includes the following prescription(s): albuterol, anastrozole, aspirin-acetaminophen-caffeine, atorvastatin, canagliflozin, duloxetine, glucose blood, goserelin, basaglar kwikpen, lidocaine-prilocaine, ondansetron, [START ON 06/21/2019] oxycodone, [START ON 07/21/2019] oxycodone, [START ON 08/20/2019] oxycodone, pregabalin, [START ON 07/29/2019] pregabalin, prochlorperazine, and trazodone, and the following Facility-Administered Medications: heparin lock flush and sodium chloride flush. She  reports that she has been smoking cigarettes. She has a 5.50 pack-year smoking history. She has quit using smokeless tobacco.  Her smokeless tobacco use included snuff. She reports that she does not drink alcohol or use drugs. Ms. WDema Severinis allergic to aspirin.   HPI  Today, she is being contacted for medication management. The patient indicates doing well with the current medication  regimen. No adverse reactions or side effects reported to the medications.  The patient indicates that she will try her best in getting that UDS done in the allowed amount of time, but she wanted  to let me know that her mother is in the hospital in [place] and she has been rendered to be terminal.  Pharmacotherapy Assessment  Analgesic: Oxycodone IR 5 mg, 1 tab PO BID (10 mg/day of oxycodone) (Average: 2 tabs/day = 10 mg/day of oxycodone) MME/day: 15 mg/day.   Monitoring: [place] PMP: PDMP reviewed during this encounter.       Pharmacotherapy: No side-effects or adverse reactions reported. Compliance: No problems identified. Effectiveness: Clinically acceptable. Plan: Refer to "POC".  UDS:  Summary  Date Value Ref Range Status  01/22/2018 FINAL  Final    Comment:    ==================================================================== TOXASSURE COMP DRUG ANALYSIS,UR ==================================================================== Test                             Result       Flag       Units Drug Present and Declared for Prescription Verification   Norhydrocodone                 255          EXPECTED   ng/mg creat    Norhydrocodone is an expected metabolite of hydrocodone.   Tramadol                       5912         EXPECTED   ng/mg creat   O-Desmethyltramadol            7009         EXPECTED   ng/mg creat   N-Desmethyltramadol            1209         EXPECTED   ng/mg creat    Source of tramadol is a prescription medication.    O-desmethyltramadol and N-desmethyltramadol are expected    metabolites of tramadol.   Gabapentin                     PRESENT      EXPECTED   Acetaminophen                  PRESENT      EXPECTED Drug Present not Declared for Prescription Verification   Alcohol, Ethyl                 0.242        UNEXPECTED g/dL    Sources of ethyl alcohol include alcoholic beverages or as a    fermentation product of glucose; glucose is present in this    specimen.   Interpret result with caution, as the presence of    ethyl alcohol is likely due, at least in part, to fermentation of    glucose.   Naproxen                       PRESENT      UNEXPECTED Drug Absent but Declared for Prescription Verification   Hydrocodone                    Not Detected UNEXPECTED ng/mg creat    Hydrocodone is almost always present in patients taking this drug    consistently. Absence of hydrocodone could be due to lapse of    time since the last dose or unusual pharmacokinetics (rapid    metabolism).   Prochlorperazine  Not Detected UNEXPECTED   Salicylate                     Not Detected UNEXPECTED    Aspirin, as indicated in the declared medication list, is not    always detected even when used as directed.   Lidocaine                      Not Detected UNEXPECTED    Lidocaine, as indicated in the declared medication list, is not    always detected even when used as directed. ==================================================================== Test                      Result    Flag   Units      Ref Range   Creatinine              33               mg/dL      >=20 ==================================================================== Declared Medications:  The flagging and interpretation on this report are based on the  following declared medications.  Unexpected results may arise from  inaccuracies in the declared medications.  **Note: The testing scope of this panel includes these medications:  Gabapentin (Neurontin)  Hydrocodone (Norco)  Prochlorperazine (Compazine)  Tramadol (Ultram)  **Note: The testing scope of this panel does not include small to  moderate amounts of these reported medications:  Acetaminophen (Excedrin)  Acetaminophen (Norco)  Aspirin (Excedrin)  Lidocaine (Lidocaine-Prilocaine)  **Note: The testing scope of this panel does not include following  reported medications:  Albuterol  Caffeine (Excedrin)  Canagliflozin  (Invokana)  Insulin  Ondansetron (Zofran)  Prilocaine (Lidocaine-Prilocaine)  Vitamin D3 ==================================================================== For clinical consultation, please call [phone]. ====================================================================    Laboratory Chemistry Profile   Renal Lab Results  Component Value Date   BUN 10 06/03/2019   CREATININE 0.80 06/03/2019   BCR 13 06/03/2019   GFRAA 99 06/03/2019   GFRNONAA 86 06/03/2019     Hepatic Lab Results  Component Value Date   AST 14 (L) 04/19/2019   ALT 15 04/19/2019   ALBUMIN 3.8 04/19/2019   ALKPHOS 79 04/19/2019     Electrolytes Lab Results  Component Value Date   NA 140 06/03/2019   K 3.7 06/03/2019   CL 101 06/03/2019   CALCIUM 9.2 06/03/2019   MG 1.9 11/23/2017     Bone Lab Results  Component Value Date   VD25OH 20.0 (L) 12/13/2017     Inflammation (CRP: Acute Phase) (ESR: Chronic Phase) Lab Results  Component Value Date   CRP 7 01/22/2018   ESRSEDRATE 27 01/22/2018       Note: Above Lab results reviewed.  Imaging  MM DIAG BREAST TOMO BILATERAL CLINICAL DATA:  History of treated left breast cancer status post breast conservation therapy in 2019.  EXAM: DIGITAL DIAGNOSTIC BILATERAL MAMMOGRAM WITH CAD AND TOMO  COMPARISON:  Previous exam(s).  ACR Breast Density Category c: The breast tissue is heterogeneously dense, which may obscure small masses.  FINDINGS: Mammographically, there are no suspicious masses, areas of nonsurgical architectural distortion or microcalcifications in either breast. Expected posttreatment changes in the left breast with moderate breast edema noted. Injectable port overlies the medial right breast.  Mammographic images were processed with CAD.  IMPRESSION: No mammographic evidence of malignancy status post left lumpectomy and radiation therapy.  RECOMMENDATION: Diagnostic mammogram is suggested in 1 year.  (Code:DM-B-01Y)  I have discussed  the findings and recommendations with the patient. Results were also provided in writing at the conclusion of the visit. If applicable, a reminder letter will be sent to the patient regarding the next appointment.  BI-RADS CATEGORY  2: Benign.  Electronically Signed   By: Fidela Salisbury M.D.   On: 09/07/2018 11:40  Assessment  The primary encounter diagnosis was Chronic pain syndrome. Diagnoses of Chronic feet pain (Primary Area of Pain) (Bilateral) (R>L), Chronic knee pain (Secondary Area of Pain) (Bilateral) (R>L), Chronic hand pain (Tertiary Area of Pain) (Bilateral) (R>L), Chronic hip pain (Fourth Area of Pain) (Bilateral) (R>L), Neuropathic pain, Neuropathic pain of feet (Bilateral), and Pharmacologic therapy were also pertinent to this visit.  Plan of Care  Problem-specific:  No problem-specific Assessment & Plan notes found for this encounter.  Ms. WYLENE WEISSMAN has a current medication list which includes the following long-term medication(s): albuterol, atorvastatin, basaglar Wyonia Hough ON 06/21/2019] oxycodone, [START ON 07/21/2019] oxycodone, [START ON 08/20/2019] oxycodone, pregabalin, [START ON 07/29/2019] pregabalin, prochlorperazine, and trazodone.  Pharmacotherapy (Medications Ordered): Meds ordered this encounter  Medications  . oxyCODONE (OXY IR/ROXICODONE) 5 MG immediate release tablet    Sig: Take 1 tablet (5 mg total) by mouth 2 (two) times daily as needed for severe pain. Must last 30 days    Dispense:  60 tablet    Refill:  0    Chronic Pain: STOP Act (Not applicable) Fill 1 day early if closed on refill date. Do not fill until: 06/21/2019. To last until: 07/21/2019. Avoid benzodiazepines within 8 hours of opioids  . oxyCODONE (OXY IR/ROXICODONE) 5 MG immediate release tablet    Sig: Take 1 tablet (5 mg total) by mouth 2 (two) times daily as needed for severe pain. Must last 30 days    Dispense:  60 tablet    Refill:  0     Chronic Pain: STOP Act (Not applicable) Fill 1 day early if closed on refill date. Do not fill until: 07/21/2019. To last until: 08/20/2019. Avoid benzodiazepines within 8 hours of opioids  . oxyCODONE (OXY IR/ROXICODONE) 5 MG immediate release tablet    Sig: Take 1 tablet (5 mg total) by mouth 2 (two) times daily as needed for severe pain. Must last 30 days    Dispense:  60 tablet    Refill:  0    Chronic Pain: STOP Act (Not applicable) Fill 1 day early if closed on refill date. Do not fill until: 08/20/2019. To last until: 09/19/2019. Avoid benzodiazepines within 8 hours of opioids  . pregabalin (LYRICA) 150 MG capsule    Sig: Take 1 capsule (150 mg total) by mouth 2 (two) times daily.    Dispense:  60 capsule    Refill:  2    Fill one day early if pharmacy is closed on scheduled refill date. May substitute for generic if available.   Orders:  Orders Placed This Encounter  Procedures  . ToxASSURE Select 13 (MW), Urine    Volume: 30 ml(s). Minimum 3 ml of urine is needed. Document temperature of fresh sample. Indications: Long term (current) use of opiate analgesic (I71.245)    Order Specific Question:   Release to patient    Answer:   Immediate   Follow-up plan:   Return in about 13 weeks (around 09/18/2019) for (F2F), (MM).      Considering:   Possible bilateral lumbar facet RFA  Diagnostic lumbar sympathetic block  Diagnostic right knee Hyalgan series #1  Diagnostic right genicular NB  Possible right genicular nerve RFA    Palliative PRN treatment(s):   Therapeutic right IA knee joint injection #3 (steroids)  Diagnostic bilateral lumbar facet block #2          Recent Visits Date Type Provider Dept  04/30/19 Procedure visit Milinda Pointer, MD [facility]  04/16/19 Procedure visit Milinda Pointer, MD [facility]  04/02/19 Procedure visit Milinda Pointer, MD [facility]  Showing recent visits within past 90 days and meeting all other  requirements   Today's Visits Date Type Provider Dept  06/19/19 Telemedicine Milinda Pointer, MD [facility]  Showing today's visits and meeting all other requirements   Future Appointments Date Type Provider Dept  06/25/19 Appointment Milinda Pointer, MD [facility]  Showing future appointments within next 90 days and meeting all other requirements   I discussed the assessment and treatment plan with the patient. The patient was provided an opportunity to ask questions and all were answered. The patient agreed with the plan and demonstrated an understanding of the instructions.  Patient advised to call back or seek an in-person evaluation if the symptoms or condition worsens.  Duration of encounter: 11 minutes.  Note by: Gaspar Cola, MD Date: 06/19/2019; Time: 3:14 PM

## 2019-06-18 ENCOUNTER — Ambulatory Visit: Payer: No Typology Code available for payment source | Admitting: Pain Medicine

## 2019-06-19 ENCOUNTER — Ambulatory Visit: Payer: 59 | Attending: Pain Medicine | Admitting: Pain Medicine

## 2019-06-19 ENCOUNTER — Other Ambulatory Visit: Payer: Self-pay

## 2019-06-19 ENCOUNTER — Telehealth: Payer: Self-pay | Admitting: *Deleted

## 2019-06-19 DIAGNOSIS — M25551 Pain in right hip: Secondary | ICD-10-CM

## 2019-06-19 DIAGNOSIS — M79641 Pain in right hand: Secondary | ICD-10-CM | POA: Diagnosis not present

## 2019-06-19 DIAGNOSIS — M25561 Pain in right knee: Secondary | ICD-10-CM

## 2019-06-19 DIAGNOSIS — G894 Chronic pain syndrome: Secondary | ICD-10-CM | POA: Diagnosis not present

## 2019-06-19 DIAGNOSIS — M79672 Pain in left foot: Secondary | ICD-10-CM

## 2019-06-19 DIAGNOSIS — Z79899 Other long term (current) drug therapy: Secondary | ICD-10-CM

## 2019-06-19 DIAGNOSIS — M79671 Pain in right foot: Secondary | ICD-10-CM | POA: Diagnosis not present

## 2019-06-19 DIAGNOSIS — M79642 Pain in left hand: Secondary | ICD-10-CM

## 2019-06-19 DIAGNOSIS — M25562 Pain in left knee: Secondary | ICD-10-CM

## 2019-06-19 DIAGNOSIS — M792 Neuralgia and neuritis, unspecified: Secondary | ICD-10-CM

## 2019-06-19 DIAGNOSIS — G5793 Unspecified mononeuropathy of bilateral lower limbs: Secondary | ICD-10-CM

## 2019-06-19 DIAGNOSIS — G8929 Other chronic pain: Secondary | ICD-10-CM

## 2019-06-19 DIAGNOSIS — M25552 Pain in left hip: Secondary | ICD-10-CM

## 2019-06-19 MED ORDER — OXYCODONE HCL 5 MG PO TABS: 5.0000 mg | ORAL_TABLET | Freq: Two times a day (BID) | ORAL | 0 refills | Status: DC | PRN

## 2019-06-19 MED ORDER — PREGABALIN 150 MG PO CAPS: 150.0000 mg | ORAL_CAPSULE | Freq: Two times a day (BID) | ORAL | 2 refills | Status: DC

## 2019-06-25 ENCOUNTER — Other Ambulatory Visit: Payer: Self-pay

## 2019-06-25 ENCOUNTER — Ambulatory Visit: Payer: 59 | Attending: Pain Medicine | Admitting: Pain Medicine

## 2019-06-25 ENCOUNTER — Encounter: Payer: Self-pay | Admitting: Pain Medicine

## 2019-06-25 VITALS — BP 103/69 | HR 81 | Temp 97.5°F | Resp 18 | Ht 64.0 in | Wt 173.0 lb

## 2019-06-25 DIAGNOSIS — M25561 Pain in right knee: Secondary | ICD-10-CM

## 2019-06-25 DIAGNOSIS — G8929 Other chronic pain: Secondary | ICD-10-CM | POA: Insufficient documentation

## 2019-06-25 DIAGNOSIS — M25562 Pain in left knee: Secondary | ICD-10-CM | POA: Diagnosis present

## 2019-06-25 DIAGNOSIS — M1711 Unilateral primary osteoarthritis, right knee: Secondary | ICD-10-CM | POA: Diagnosis present

## 2019-06-25 MED ORDER — ROPIVACAINE HCL 2 MG/ML IJ SOLN
5.0000 mL | Freq: Once | INTRAMUSCULAR | Status: AC
  Administered 2019-06-25: 5 mL via INTRA_ARTICULAR

## 2019-06-25 MED ORDER — ROPIVACAINE HCL 2 MG/ML IJ SOLN
INTRAMUSCULAR | Status: AC
  Filled 2019-06-25: qty 10

## 2019-06-25 MED ORDER — LIDOCAINE HCL (PF) 1 % IJ SOLN
INTRAMUSCULAR | Status: AC
  Filled 2019-06-25: qty 5

## 2019-06-25 MED ORDER — SODIUM HYALURONATE (VISCOSUP) 20 MG/2ML IX SOSY: 2.0000 mL | PREFILLED_SYRINGE | Freq: Once | INTRA_ARTICULAR | Status: DC

## 2019-06-25 MED ORDER — LIDOCAINE HCL (PF) 1 % IJ SOLN
5.0000 mL | Freq: Once | INTRAMUSCULAR | Status: AC
  Administered 2019-06-25: 5 mL

## 2019-06-25 NOTE — Progress Notes (Signed)
Safety precautions to be maintained throughout the outpatient stay will include: orient to surroundings, keep bed in low position, maintain call bell within reach at all times, provide assistance with transfer out of bed and ambulation.  

## 2019-06-25 NOTE — Progress Notes (Signed)
PROVIDER NOTE: Information contained herein reflects review and annotations entered in association with encounter. Interpretation of such information and data should be left to medically-trained personnel. Information provided to patient can be located elsewhere in the medical record under "Patient Instructions". Document created using STT-dictation technology, any transcriptional errors that may result from process are unintentional.    Patient: Carolyn Roy  Service Category: Procedure  Provider: Gaspar Cola, MD  DOB: 10/12/1965  DOS: 06/25/2019  Location: [facility]  MRN: [id number]  Setting: Ambulatory - outpatient  Referring Provider: Milinda Pointer, MD  Type: Established Patient  Specialty: Interventional Pain Management  PCP: Guadalupe Maple, MD   Primary Reason for Visit: Interventional Pain Management Treatment. CC: Knee Pain (right)  Procedure:          Anesthesia, Analgesia, Anxiolysis:  Type: Therapeutic Intra-Articular Hyalgan Knee Injection #5  Region: Lateral infrapatellar Knee Region Level: Knee Joint Laterality: Right knee  Type: Local Anesthesia Indication(s): Analgesia         Local Anesthetic: Lidocaine 1-2% Route: Infiltration ([place]/IM) IV Access: Declined Sedation: Declined   Position: Sitting   Indications: 1. Chronic knee pain (Secondary Area of Pain) (Bilateral) (R>L)   2. Osteoarthritis of knee (Right)    Pain Score: Pre-procedure: 2 /10 Post-procedure: 0-No pain/10   Pre-op Assessment:  Carolyn Roy is a 54 y.o. (year old), female patient, seen today for interventional treatment. She  has a past surgical history that includes Oophorectomy; Tubal ligation; Portacath placement (Right, 07/14/2017); Axillary lymph node biopsy (Left, 07/14/2017); Breast lumpectomy (Left, 01/12/2018); Breast biopsy (Left); Partial mastectomy with needle localization (Left, 01/12/2018); and Sentinel node biopsy (Left, 01/12/2018). Carolyn Roy has a current  medication list which includes the following prescription(s): albuterol, anastrozole, aspirin-acetaminophen-caffeine, atorvastatin, canagliflozin, duloxetine, glucose blood, goserelin, basaglar kwikpen, lidocaine-prilocaine, ondansetron, oxycodone, [START ON 07/21/2019] oxycodone, [START ON 08/20/2019] oxycodone, pregabalin, prochlorperazine, trazodone, hydroxyzine, and [START ON 07/29/2019] pregabalin, and the following Facility-Administered Medications: heparin lock flush, sodium chloride flush, and sodium hyaluronate. Her primarily concern today is the Knee Pain (right)  Initial Vital Signs:  Pulse/HCG Rate: 100  Temp: (!) 97.5 F (36.4 C) Resp: 18 BP: 115/78 SpO2: 99 %  BMI: Estimated body mass index is 29.7 kg/m as calculated from the following:   Height as of this encounter: 5\' 4"  (1.626 m).   Weight as of this encounter: 173 lb (78.5 kg).  Risk Assessment: Allergies: Reviewed. She is allergic to aspirin.  Allergy Precautions: None required Coagulopathies: Reviewed. None identified.  Blood-thinner therapy: None at this time Active Infection(s): Reviewed. None identified. Carolyn Roy is afebrile  Site Confirmation: Carolyn Roy was asked to confirm the procedure and laterality before marking the site Procedure checklist: Completed Consent: Before the procedure and under the influence of no sedative(s), amnesic(s), or anxiolytics, the patient was informed of the treatment options, risks and possible complications. To fulfill our ethical and legal obligations, as recommended by the American Medical Association's Code of Ethics, I have informed the patient of my clinical impression; the nature and purpose of the treatment or procedure; the risks, benefits, and possible complications of the intervention; the alternatives, including doing nothing; the risk(s) and benefit(s) of the alternative treatment(s) or procedure(s); and the risk(s) and benefit(s) of doing nothing. The patient was provided  information about the general risks and possible complications associated with the procedure. These may include, but are not limited to: failure to achieve desired goals, infection, bleeding, organ or nerve damage, allergic reactions, paralysis, and death. In  addition, the patient was informed of those risks and complications associated to the procedure, such as failure to decrease pain; infection; bleeding; organ or nerve damage with subsequent damage to sensory, motor, and/or autonomic systems, resulting in permanent pain, numbness, and/or weakness of one or several areas of the body; allergic reactions; (i.e.: anaphylactic reaction); and/or death. Furthermore, the patient was informed of those risks and complications associated with the medications. These include, but are not limited to: allergic reactions (i.e.: anaphylactic or anaphylactoid reaction(s)); adrenal axis suppression; blood sugar elevation that in diabetics may result in ketoacidosis or comma; water retention that in patients with history of congestive heart failure may result in shortness of breath, pulmonary edema, and decompensation with resultant heart failure; weight gain; swelling or edema; medication-induced neural toxicity; particulate matter embolism and blood vessel occlusion with resultant organ, and/or nervous system infarction; and/or aseptic necrosis of one or more joints. Finally, the patient was informed that Medicine is not an exact science; therefore, there is also the possibility of unforeseen or unpredictable risks and/or possible complications that may result in a catastrophic outcome. The patient indicated having understood very clearly. We have given the patient no guarantees and we have made no promises. Enough time was given to the patient to ask questions, all of which were answered to the patient's satisfaction. Carolyn Roy has indicated that she wanted to continue with the procedure. Attestation: I, the ordering  provider, attest that I have discussed with the patient the benefits, risks, side-effects, alternatives, likelihood of achieving goals, and potential problems during recovery for the procedure that I have provided informed consent. Date  Time: 06/25/2019  1:48 PM  Pre-Procedure Preparation:  Monitoring: As per clinic protocol. Respiration, ETCO2, SpO2, BP, heart rate and rhythm monitor placed and checked for adequate function Safety Precautions: Patient was assessed for positional comfort and pressure points before starting the procedure. Time-out: I initiated and conducted the "Time-out" before starting the procedure, as per protocol. The patient was asked to participate by confirming the accuracy of the "Time Out" information. Verification of the correct person, site, and procedure were performed and confirmed by me, the nursing staff, and the patient. "Time-out" conducted as per Joint Commission's Universal Protocol (UP.01.01.01). Time: 1415  Description of Procedure:          Target Area: Knee Joint Approach: Just above the Lateral tibial plateau, lateral to the infrapatellar tendon. Area Prepped: Entire knee area, from the mid-thigh to the mid-shin. DuraPrep (Iodine Povacrylex [0.7% available iodine] and Isopropyl Alcohol, 74% w/w) Safety Precautions: Aspiration looking for blood return was conducted prior to all injections. At no point did we inject any substances, as a needle was being advanced. No attempts were made at seeking any paresthesias. Safe injection practices and needle disposal techniques used. Medications properly checked for expiration dates. SDV (single dose vial) medications used. Description of the Procedure: Protocol guidelines were followed. The patient was placed in position over the fluoroscopy table. The target area was identified and the area prepped in the usual manner. Skin & deeper tissues infiltrated with local anesthetic. Appropriate amount of time allowed to pass  for local anesthetics to take effect. The procedure needles were then advanced to the target area. Proper needle placement secured. Negative aspiration confirmed. Solution injected in intermittent fashion, asking for systemic symptoms every 0.5cc of injectate. The needles were then removed and the area cleansed, making sure to leave some of the prepping solution back to take advantage of its long term bactericidal properties. Vitals:  06/25/19 1347 06/25/19 1418  BP: 115/78 103/69  Pulse: 100 81  Resp: 18 18  Temp: (!) 97.5 F (36.4 C)   SpO2: 99% 98%  Weight: 173 lb (78.5 kg)   Height: 5\' 4"  (1.626 m)     Start Time: 1415 hrs. End Time: 1418 hrs. Materials:  Needle(s) Type: Regular needle Gauge: 25G Length: 1.5-in Medication(s): Please see orders for medications and dosing details.  Imaging Guidance:          Type of Imaging Technique: None used Indication(s): N/A Exposure Time: No patient exposure Contrast: None used. Fluoroscopic Guidance: N/A Ultrasound Guidance: N/A Interpretation: N/A  Antibiotic Prophylaxis:   Anti-infectives (From admission, onward)   None     Indication(s): None identified  Post-operative Assessment:  Post-procedure Vital Signs:  Pulse/HCG Rate: 81  Temp: (!) 97.5 F (36.4 C) Resp: 18 BP: 103/69 SpO2: 98 %  EBL: None  Complications: No immediate post-treatment complications observed by team, or reported by patient.  Note: The patient tolerated the entire procedure well. A repeat set of vitals were taken after the procedure and the patient was kept under observation following institutional policy, for this type of procedure. Post-procedural neurological assessment was performed, showing return to baseline, prior to discharge. The patient was provided with post-procedure discharge instructions, including a section on how to identify potential problems. Should any problems arise concerning this procedure, the patient was given instructions to  immediately contact us, at any time, without hesitation. In any case, we plan to contact the patient by telephone for a follow-up status report regarding this interventional procedure.  Comments:  No additional relevant information.  Plan of Care  Orders:  Orders Placed This Encounter  Procedures  . KNEE INJECTION    Hyalgan knee injection to be done by MD.    Scheduling Instructions:     Procedure: Intra-articular Hyalgan Knee injection #5     Side(s): Right Knee     Sedation: None     Timeframe: Today    Order Specific Question:   Where will this procedure be performed?    Answer:   [facility]  . Informed Consent Details: Physician/Practitioner Attestation; Transcribe to consent form and obtain patient signature    Provider Attestation: I, Hilldale Dossie Arbour, MD, (Pain Management Specialist), the physician/practitioner, attest that I have discussed with the patient the benefits, risks, side effects, alternatives, likelihood of achieving goals and potential problems during recovery for the procedure that I have provided informed consent.    Scheduling Instructions:     Procedure: Therapeutic, right sided, intra-articular Hyalgan knee injection     Indications: Chronic right knee pain secondary to primary osteoarthritis of the knee     Note: Always confirm laterality of pain with Ms. White, before procedure.     Transcribe to consent form and obtain patient signature.  . Provide equipment / supplies at bedside    Equipment required: Single use, disposable, "Block Tray"    Standing Status:   Standing    Number of Occurrences:   1    Order Specific Question:   Specify    Answer:   Block Tray   Chronic Opioid Analgesic:  Oxycodone IR 5 mg, 1 tab PO BID (10 mg/day of oxycodone) (Average: 2 tabs/day = 10 mg/day of oxycodone) MME/day: 15 mg/day.   Medications ordered for procedure: Meds ordered this encounter  Medications  . lidocaine (PF) (XYLOCAINE) 1 % injection 5 mL  .  ropivacaine (PF) 2 mg/mL (0.2%) (NAROPIN) injection  5 mL  . Sodium Hyaluronate SOSY 2 mL   Medications administered: We administered lidocaine (PF) and ropivacaine (PF) 2 mg/mL (0.2%).  See the medical record for exact dosing, route, and time of administration.  Follow-up plan:   Return in about 2 weeks (around 07/09/2019) for (VV), (PP).       Considering:   Possible bilateral lumbar facet RFA  Diagnostic lumbar sympathetic block  Diagnostic right genicular NB  Possible right genicular nerve RFA    Palliative PRN treatment(s):   Therapeutic right IA knee joint injection #3 (steroids)  Diagnostic bilateral lumbar facet block #2  Therapeutic/palliative right knee Hyalgan inj. S2/N1     Recent Visits Date Type Provider Dept  06/25/19 Procedure visit Milinda Pointer, MD [facility]  06/19/19 Telemedicine Milinda Pointer, MD [facility]  04/30/19 Procedure visit Milinda Pointer, MD [facility]  04/16/19 Procedure visit Milinda Pointer, MD [facility]  04/02/19 Procedure visit Milinda Pointer, MD [facility]  Showing recent visits within past 90 days and meeting all other requirements   Future Appointments Date Type Provider Dept  07/10/19 Appointment Milinda Pointer, MD [facility]  09/18/19 Appointment Milinda Pointer, MD [facility]  Showing future appointments within next 90 days and meeting all other requirements   Disposition: Discharge home  Discharge (Date  Time): 06/25/2019; 1420 hrs.   Primary Care Physician: Guadalupe Maple, MD Location: [facility] Note by: Gaspar Cola, MD Date: 06/25/2019; Time: 5:46 PM  Disclaimer:  Medicine is not an Chief Strategy Officer. The only guarantee in medicine is that nothing is guaranteed. It is important to note that the decision to proceed with this intervention was based on the information collected from the  patient. The Data and conclusions were drawn from the patient's questionnaire, the interview, and the physical examination. Because the information was provided in large part by the patient, it cannot be guaranteed that it has not been purposely or unconsciously manipulated. Every effort has been made to obtain as much relevant data as possible for this evaluation. It is important to note that the conclusions that lead to this procedure are derived in large part from the available data. Always take into account that the treatment will also be dependent on availability of resources and existing treatment guidelines, considered by other Pain Management Practitioners as being common knowledge and practice, at the time of the intervention. For Medico-Legal purposes, it is also important to point out that variation in procedural techniques and pharmacological choices are the acceptable norm. The indications, contraindications, technique, and results of the above procedure should only be interpreted and judged by a Board-Certified Interventional Pain Specialist with extensive familiarity and expertise in the same exact procedure and technique.

## 2019-06-25 NOTE — Patient Instructions (Signed)

## 2019-06-26 ENCOUNTER — Telehealth: Payer: Self-pay

## 2019-06-26 NOTE — Telephone Encounter (Signed)
Post procedure phone call.  Patient states she is doing well.  

## 2019-07-03 ENCOUNTER — Ambulatory Visit: Payer: Self-pay | Admitting: Pharmacist

## 2019-07-03 ENCOUNTER — Other Ambulatory Visit: Payer: Self-pay | Admitting: Nurse Practitioner

## 2019-07-03 DIAGNOSIS — F17219 Nicotine dependence, cigarettes, with unspecified nicotine-induced disorders: Secondary | ICD-10-CM

## 2019-07-03 DIAGNOSIS — Z794 Long term (current) use of insulin: Secondary | ICD-10-CM

## 2019-07-03 DIAGNOSIS — E1165 Type 2 diabetes mellitus with hyperglycemia: Secondary | ICD-10-CM

## 2019-07-03 DIAGNOSIS — F322 Major depressive disorder, single episode, severe without psychotic features: Secondary | ICD-10-CM

## 2019-07-03 MED ORDER — OZEMPIC (0.25 OR 0.5 MG/DOSE) 2 MG/1.5ML ~~LOC~~ SOPN: PEN_INJECTOR | SUBCUTANEOUS | 6 refills | Status: DC

## 2019-07-03 NOTE — Chronic Care Management (AMB) (Signed)
Chronic Care Management   Note  07/03/2019 Name: Carolyn Roy MRN: [id number] DOB: April 29, 1965   Subjective:  Carolyn Roy is a 54 y.o. year old female who is a primary care patient of Crissman, Jeannette How, MD. The CCM team was consulted for assistance with chronic disease management and care coordination needs.    Contacted patient for medication management review  Review of patient status, including review of consultants reports, laboratory and other test data, was performed as part of comprehensive evaluation and provision of chronic care management services.   SDOH (Social Determinants of Health) assessments and interventions performed:  SDOH Interventions     Most Recent Value  SDOH Interventions  Financial Strain Interventions  Other (Comment) [Discussed availability of copay cards]     yes  Objective:  Lab Results  Component Value Date   CREATININE 0.80 06/03/2019   CREATININE 0.49 04/19/2019   CREATININE 0.61 03/06/2019    Lab Results  Component Value Date   HGBA1C 9.3 (H) 06/03/2019       Component Value Date/Time   CHOL 198 06/03/2019 1435   CHOL WILL FOLLOW 11/15/2016 1452   TRIG 135 06/03/2019 1435   TRIG WILL FOLLOW 11/15/2016 1452   HDL 45 06/03/2019 1435   VLDL WILL FOLLOW 11/15/2016 1452   [place] 129 (H) 06/03/2019 1435    Clinical ASCVD: No  The 10-year ASCVD risk score Mikey Bussing DC Jr., et al., 2013) is: 5.9%   Values used to calculate the score:     Age: 39 years     Sex: Female     Is Non-Hispanic African American: No     Diabetic: Yes     Tobacco smoker: Yes     Systolic Blood Pressure: 149 mmHg     Is BP treated: No     HDL Cholesterol: 45 mg/dL     Total Cholesterol: 198 mg/dL    BP Readings from Last 3 Encounters:  06/25/19 103/69  06/03/19 (!) 92/57  04/30/19 110/79    Allergies  Allergen Reactions  . Aspirin Nausea And Vomiting    Medications Reviewed Today    Reviewed by De Hollingshead, [facility] (Pharmacist) on  07/03/19 at [place] List Status: <None>  Medication Order Taking? Sig Documenting Provider Last Dose Status Informant  albuterol (PROVENTIL HFA;VENTOLIN HFA) 108 (90 Base) MCG/ACT inhaler [id number] Yes Inhale 2 puffs into the lungs every 6 (six) hours as needed for wheezing or shortness of breath. Kathrine Haddock, NP Taking Active Self  anastrozole (ARIMIDEX) 1 MG tablet [id number] Yes Take 1 tablet (1 mg total) by mouth daily. Verlon Au, NP Taking Active   aspirin-acetaminophen-caffeine [facility] MIGRAINE) [phone] MG tablet [id number] Yes Take 2 tablets by mouth daily as needed for headache. [provider] Taking Active Self  atorvastatin (LIPITOR) 20 MG tablet [id number] Yes Take 1 tablet (20 mg total) by mouth daily. Marnee Guarneri T, NP Taking Active   canagliflozin (INVOKANA) 300 MG TABS tablet [id number] Yes Take 1 tablet (300 mg total) by mouth daily. Marnee Guarneri T, NP Taking Active   DULoxetine (CYMBALTA) 60 MG capsule [id number] Yes Take 60 mg by mouth daily. [provider] Taking Active   glucose blood (ONE TOUCH ULTRA TEST) test strip [id number] Yes Use up to 4 times/day Kathrine Haddock, NP Taking Active Self  goserelin (ZOLADEX) 3.6 MG injection [id number] Yes Inject 3.6 mg into the skin every 28 (twenty-eight) days. [provider] Taking Active  Med Note Cassandria Anger Dec 11, 2018  1:47 PM) Patient states she gets this every 3 months  hydrOXYzine (VISTARIL) 50 MG capsule [id number] Yes Take 50 mg by mouth 2 (two) times daily as needed. [provider] Taking Active            Med Note BETANIA, DIZON Jul 03, 2019 12:59 PM) QPM  Insulin Glargine [facility] KWIKPEN) 100 UNIT/ML SOPN [id number] Yes Inject 0.1 mLs (10 Units total) into the skin at bedtime. Marnee Guarneri T, NP Taking Active   lidocaine-prilocaine (EMLA) cream [id number] Yes Apply to affected area once Karen Kitchens, NP Taking Active Self             Med Note Stevenson Clinch Mar 06, 2019  3:28 PM) As needed  ondansetron [facility]) 8 MG tablet [id number] Yes Take 1 tablet (8 mg total) by mouth 2 (two) times daily as needed. Start on the third day after chemotherapy. Lequita Asal, MD Taking Active Self           Med Note Lorel Monaco Jul 20, 2017  8:57 AM)    oxyCODONE (OXY IR/ROXICODONE) 5 MG immediate release tablet [id number] Yes Take 1 tablet (5 mg total) by mouth 2 (two) times daily as needed for severe pain. Must last 30 days Milinda Pointer, MD Taking Active   oxyCODONE (OXY IR/ROXICODONE) 5 MG immediate release tablet [id number]  Take 1 tablet (5 mg total) by mouth 2 (two) times daily as needed for severe pain. Must last 30 days Milinda Pointer, MD  Active   oxyCODONE (OXY IR/ROXICODONE) 5 MG immediate release tablet [id number]  Take 1 tablet (5 mg total) by mouth 2 (two) times daily as needed for severe pain. Must last 30 days Milinda Pointer, MD  Active         Discontinued 28/31[birth year] 7616 (Duplicate)   pregabalin (LYRICA) 150 MG capsule [id number] Yes Take 1 capsule (150 mg total) by mouth 2 (two) times daily. Milinda Pointer, MD Taking Active   prochlorperazine (COMPAZINE) 10 MG tablet [id number] Yes Take 1 tablet (10 mg total) by mouth every 6 (six) hours as needed (Nausea or vomiting). Lequita Asal, MD Taking Active Self           Med Note Lorel Monaco Jul 20, 2017  8:57 AM)    traZODone (DESYREL) 50 MG tablet [id number] Yes TAKE 1-2 TABLETS (50-100 MG TOTAL) BY MOUTH AT BEDTIME AS NEEDED FOR SLEEP.  Patient taking differently: Take 100 mg by mouth at bedtime as needed for sleep.    Cammie Sickle, MD Taking Active   Med List Note Janett Billow, RN 05/24/18 1021): UDS 01/22/18 MR 07/08/18 Medication agreement signed 02-14-18             Assessment:   Goals Addressed            This Visit's Progress     Patient Stated   . PharmD "I want to stay healthy"  (pt-stated)       CARE PLAN ENTRY (see longtitudinal plan of care for additional care plan information)  Current Barriers:  . Diabetes: uncontrolled; complicated by chronic medical conditions including CKD, breast cancer, COPD, chronic pain, depression, HLD, most recent A1c 9.3% o Reports she works at [employer], has an active lifestyle.  o Notes that her mother is getting ready to move in with her from [place], as she was diagnosed with stage 4 cancer.  . Most recent eGFR: ~ 99 mL/min . Current antihyperglycemic regimen: Invokana 300 mg daily, Basaglar 10 units daily o Reports hx Ozempic. Was given a sample for 6 weeks, denies any issues with it, but reports she didn't have a continued prescription so stopped taking it . Denies hypoglycemia . Reports hyperglycemic symptoms, including polydipsia, neuropathy (though also chronic pain) . Current meal patterns o Snacks: takes fruits to snack on at work o Drinks: focusing on more water as opposed sodas  . Current exercise: none . Current blood glucose readings:  o Fasting: reports randoms 160-250s, after meals particularly elevated . Cardiovascular risk reduction: o Current hypertensive regimen: n/a o Current hyperlipidemia regimen: atorvastatin 20 mg daily, baseline LDL 129, 10 year ASCVD risk 5.9% o Current antiplatelet regimen: n/a (hx intolerance to ASA) . 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 Depression; Dr. Milbert Coulter; duloxetine 60 mg daily, hydroxyzine 50 mg QPM, trazodone 100 mg QPM PRN sleep; does not need to take every night; reports improvement in mood since starting on duloxetine . Chronic pain; Dr. Consuela Mimes; oxycodone 5 BID, pregabalin 150 mg BID  Pharmacist Clinical Goal(s):  [name] Over the next 90 days, patient will work with PharmD and primary care provider to address optimized medication management  Interventions: . Comprehensive medication  review performed, medication list updated in electronic medical record . Inter-disciplinary care team collaboration (see longitudinal plan of care) . Reviewed current readings. Given need for prandial coverage, and patient's previous tolerance, recommend d/c Basaglar and start Ozempic 0.25 mg weekly x 4 weeks then increase to 0.5 mg weekly. 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 Patient interested and verbalizes understanding about how to obtain copay cards. Will collaborate w/ PCP on this recommendation . Reviewed reason for atorvastatin given DM. Patient verbalizes understanding  . Patient is planning on calling the office to see if her mother can be taken as a new patient. Will continue to follow patient w/ CCM team to support caregiver burden  Patient Self Care Activities:  . Patient will check blood glucose BID, document, and provide at future appointments . Patient will take medications as prescribed . Patient will report any questions or concerns to provider   Initial goal documentation        Plan: - Scheduled f/u call in ~8 weeks  Catie Darnelle Maffucci, PharmD, [place] [phone]

## 2019-07-03 NOTE — Progress Notes (Signed)
Change from [place] to [place], review CCM note.

## 2019-07-03 NOTE — Patient Instructions (Signed)
Visit Information  Goals Addressed            This Visit's Progress     Patient Stated   . PharmD "I want to stay healthy" (pt-stated)       CARE PLAN ENTRY (see longtitudinal plan of care for additional care plan information)  Current Barriers:  . Diabetes: uncontrolled; complicated by chronic medical conditions including CKD, breast cancer, COPD, chronic pain, depression, HLD, most recent A1c 9.3% o Reports she works at [employer], has an active lifestyle.  o Notes that her mother is getting ready to move in with her from [place], as she was diagnosed with stage 4 cancer.  . Most recent eGFR: ~ 99 mL/min . Current antihyperglycemic regimen: Invokana 300 mg daily, Basaglar 10 units daily o Reports hx Ozempic. Was given a sample for 6 weeks, denies any issues with it, but reports she didn't have a continued prescription so stopped taking it . Denies hypoglycemia . Reports hyperglycemic symptoms, including polydipsia, neuropathy (though also chronic pain) . Current meal patterns o Snacks: takes fruits to snack on at work o Drinks: focusing on more water as opposed sodas  . Current exercise: none . Current blood glucose readings:  o Fasting: reports randoms 160-250s, after meals particularly elevated . Cardiovascular risk reduction: o Current hypertensive regimen: n/a o Current hyperlipidemia regimen: atorvastatin 20 mg daily, baseline LDL 129, 10 year ASCVD risk 5.9% o Current antiplatelet regimen: n/a (hx intolerance to ASA) . 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 Depression; Dr. Milbert Coulter; duloxetine 60 mg daily, hydroxyzine 50 mg QPM, trazodone 100 mg QPM PRN sleep; does not need to take every night; reports improvement in mood since starting on duloxetine . Chronic pain; Dr. Consuela Mimes; oxycodone 5 BID, pregabalin 150 mg BID  Pharmacist Clinical Goal(s):  [name] Over the next 90 days, patient will  work with PharmD and primary care provider to address optimized medication management  Interventions: . Comprehensive medication review performed, medication list updated in electronic medical record . Inter-disciplinary care team collaboration (see longitudinal plan of care) . Reviewed current readings. Given need for prandial coverage, and patient's previous tolerance, recommend d/c Basaglar and start Ozempic 0.25 mg weekly x 4 weeks then increase to 0.5 mg weekly. 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 Patient interested and verbalizes understanding about how to obtain copay cards. Will collaborate w/ PCP on this recommendation . Reviewed reason for atorvastatin given DM. Patient verbalizes understanding  . Patient is planning on calling the office to see if her mother can be taken as a new patient. Will continue to follow patient w/ CCM team to support caregiver burden  Patient Self Care Activities:  . Patient will check blood glucose BID, document, and provide at future appointments . Patient will take medications as prescribed . Patient will report any questions or concerns to provider   Initial goal documentation        Patient verbalizes understanding of instructions provided today.    Plan: - Scheduled f/u call in ~8 weeks  Catie Darnelle Maffucci, PharmD, [facility] [phone]

## 2019-07-04 ENCOUNTER — Telehealth: Payer: Self-pay

## 2019-07-04 NOTE — Telephone Encounter (Signed)
PA submitted via cover my meds for Ozempic, awaiting approval or denial.

## 2019-07-09 NOTE — Telephone Encounter (Signed)
PA Approved

## 2019-07-10 ENCOUNTER — Other Ambulatory Visit: Payer: Self-pay

## 2019-07-10 ENCOUNTER — Ambulatory Visit: Payer: 59 | Admitting: Pain Medicine

## 2019-07-11 ENCOUNTER — Ambulatory Visit: Payer: Self-pay | Admitting: General Practice

## 2019-07-11 NOTE — Chronic Care Management (AMB) (Signed)
  Chronic Care Management   Outreach Note  07/11/2019 Name: Carolyn Roy MRN: [id number] DOB: Sep 16, 1965  Referred by: Guadalupe Maple, MD Reason for referral : Care Coordination (2nd attempt: Care coordination needs related to DM)   A second unsuccessful telephone outreach was attempted today. The patient was referred to the case management team for assistance with care management and care coordination.   Follow Up Plan: A HIPPA compliant phone message was left for the patient providing contact information and requesting a return call.   Noreene Larsson RN, MSN, [facility] Mobile: [phone]

## 2019-07-16 ENCOUNTER — Ambulatory Visit: Payer: 59 | Attending: Pain Medicine | Admitting: Pain Medicine

## 2019-07-16 ENCOUNTER — Other Ambulatory Visit: Payer: Self-pay

## 2019-07-16 VITALS — Ht 64.0 in | Wt 170.0 lb

## 2019-07-16 DIAGNOSIS — M25561 Pain in right knee: Secondary | ICD-10-CM

## 2019-07-16 DIAGNOSIS — G894 Chronic pain syndrome: Secondary | ICD-10-CM | POA: Diagnosis not present

## 2019-07-16 DIAGNOSIS — G8929 Other chronic pain: Secondary | ICD-10-CM

## 2019-07-16 DIAGNOSIS — M25562 Pain in left knee: Secondary | ICD-10-CM | POA: Diagnosis not present

## 2019-07-16 DIAGNOSIS — M1711 Unilateral primary osteoarthritis, right knee: Secondary | ICD-10-CM

## 2019-07-16 NOTE — Progress Notes (Signed)
Patient: Carolyn Roy  Service Category: E/M  Provider: Gaspar Cola, MD  DOB: 01/13/1966  DOS: 07/16/2019  Location: Office  MRN: [id number]  Setting: Ambulatory outpatient  Referring Provider: Guadalupe Maple, MD  Type: Established Patient  Specialty: Interventional Pain Management  PCP: Guadalupe Maple, MD  Location: Remote location  Delivery: TeleHealth     Virtual Encounter - Pain Management PROVIDER NOTE: Information contained herein reflects review and annotations entered in association with encounter. Interpretation of such information and data should be left to medically-trained personnel. Information provided to patient can be located elsewhere in the medical record under "Patient Instructions". Document created using STT-dictation technology, any transcriptional errors that may result from process are unintentional.    Contact & Pharmacy Preferred: [phone] Home: [phone] (home) Mobile: [phone] (mobile) E-mail: [email or web address]  [id number] [place] [id number]: [phone]: [phone]  Pre-screening  Carolyn Roy offered "in-person" vs "virtual" encounter. She indicated preferring virtual for this encounter.   Reason COVID-19*  Social distancing based on CDC and AMA recommendations.   I contacted Carolyn Emeraldon 07/16/2019 via telephone.      I clearly identified myself as FGaspar Cola MD. I verified that I was speaking with the correct person using two identifiers (Name: Carolyn Roy and date of birth: [phone].  Consent I sought verbal advanced consent from Carolyn Emeraldfor virtual visit interactions. I informed Carolyn Roy of possible security and privacy concerns, risks, and limitations associated with providing "not-in-person" medical evaluation and management services. I also informed Carolyn Roy of the availability of "in-person" appointments. Finally, I informed her  that there would be a charge for the virtual visit and that she could be  personally, fully or partially, financially responsible for it. Ms. WDema Severinexpressed understanding and agreed to proceed.   Historic Elements   Ms. CBLAINE GUIFFREis a 54y.o. year old, female patient evaluated today after her last contact with our practice on 06/26/2019. Carolyn Roy  has a past medical history of [place] ([place], [place]), Depression, Diabetes mellitus without complication ([place] ([id number], Endometriosis, Family history of breast cancer, Headache, Hyperlipidemia, Ovarian mass, Personal history of chemotherapy, Personal history of radiation therapy, and Pneumonia. She also  has a past surgical history that includes Oophorectomy; Tubal ligation; Portacath placement (Right, 07/14/2017); Axillary lymph node biopsy (Left, 07/14/2017); Breast lumpectomy (Left, 01/12/2018); Breast biopsy (Left); Partial mastectomy with needle localization (Left, 01/12/2018); and Sentinel node biopsy (Left, 01/12/2018). Ms. WDema Severinhas a current medication list which includes the following prescription(s): albuterol, anastrozole, aspirin-acetaminophen-caffeine, atorvastatin, canagliflozin, duloxetine, glucose blood, goserelin, hydroxyzine, lidocaine-prilocaine, ondansetron, oxycodone, [START ON 07/21/2019] oxycodone, [START ON 08/20/2019] oxycodone, [START ON 07/29/2019] pregabalin, prochlorperazine, ozempic (0.25 or 0.5 mg/dose), and trazodone, and the following Facility-Administered Medications: heparin lock flush and sodium chloride flush. She  reports that she has been smoking cigarettes. She has a 5.50 pack-year smoking history. She has quit using smokeless tobacco.  Her smokeless tobacco use included snuff. She reports that she does not drink alcohol or use drugs. Ms. WDema Severinis allergic to aspirin.   HPI  Today, she is being contacted for a post-procedure assessment.  According to the patient she attained good relief of the pain,  however she has noticed that her knee still "catching".  Today I have explained to the patient that although I may be able to help her with the pain, I cannot  help her with the mechanics of the knee joint.  For this, she will be needing an orthopedic surgeon evaluation.  She indicates that she does not have any orthopedic surgeons that are following her for that knee problem and therefore today I will be sending a referral for this.  Post-Procedure Evaluation  Procedure: Therapeutic right intra-articular Hyalgan knee injection #5, no fluoroscopy or IV sedation Pre-procedure pain level: 2/10 Post-procedure: 0/10 (100% relief)  Sedation: None.  Effectiveness during initial hour after procedure(Ultra-Short Term Relief): 100 %.  Local anesthetic used: Long-acting (4-6 hours) Effectiveness: Defined as any analgesic benefit obtained secondary to the administration of local anesthetics. This carries significant diagnostic value as to the etiological location, or anatomical origin, of the pain. Duration of benefit is expected to coincide with the duration of the local anesthetic used.  Effectiveness during initial 4-6 hours after procedure(Short-Term Relief): 100 %.  Long-term benefit: Defined as any relief past the pharmacologic duration of the local anesthetics.  Effectiveness past the initial 6 hours after procedure(Long-Term Relief): 75 %(for about 1 week).  Current benefits: Defined as benefit that persist at this time.   Analgesia:  70% improvement that seems to be ongoing Function: Carolyn Roy reports improvement in function, however she has noticed that the knee seems to be "catching". ROM: Carolyn Roy reports improvement in ROM  Pharmacotherapy Assessment  Analgesic: Oxycodone IR 5 mg, 1 tab PO BID (10 mg/day of oxycodone) (Average: 2 tabs/day = 10 mg/day of oxycodone) MME/day: 15 mg/day.   Monitoring: [place] PMP: PDMP reviewed during this encounter.       Pharmacotherapy: No side-effects or  adverse reactions reported. Compliance: No problems identified. Effectiveness: Clinically acceptable. Plan: Refer to "POC".  UDS:  Summary  Date Value Ref Range Status  01/22/2018 FINAL  Final    Comment:    ==================================================================== TOXASSURE COMP DRUG ANALYSIS,UR ==================================================================== Test                             Result       Flag       Units Drug Present and Declared for Prescription Verification   Norhydrocodone                 255          EXPECTED   ng/mg creat    Norhydrocodone is an expected metabolite of hydrocodone.   Tramadol                       5912         EXPECTED   ng/mg creat   O-Desmethyltramadol            7009         EXPECTED   ng/mg creat   N-Desmethyltramadol            1209         EXPECTED   ng/mg creat    Source of tramadol is a prescription medication.    O-desmethyltramadol and N-desmethyltramadol are expected    metabolites of tramadol.   Gabapentin                     PRESENT      EXPECTED   Acetaminophen                  PRESENT      EXPECTED Drug Present not Declared for Prescription Verification  Alcohol, Ethyl                 0.242        UNEXPECTED g/dL    Sources of ethyl alcohol include alcoholic beverages or as a    fermentation product of glucose; glucose is present in this    specimen.  Interpret result with caution, as the presence of    ethyl alcohol is likely due, at least in part, to fermentation of    glucose.   Naproxen                       PRESENT      UNEXPECTED Drug Absent but Declared for Prescription Verification   Hydrocodone                    Not Detected UNEXPECTED ng/mg creat    Hydrocodone is almost always present in patients taking this drug    consistently. Absence of hydrocodone could be due to lapse of    time since the last dose or unusual pharmacokinetics (rapid    metabolism).   Prochlorperazine               Not  Detected UNEXPECTED   Salicylate                     Not Detected UNEXPECTED    Aspirin, as indicated in the declared medication list, is not    always detected even when used as directed.   Lidocaine                      Not Detected UNEXPECTED    Lidocaine, as indicated in the declared medication list, is not    always detected even when used as directed. ==================================================================== Test                      Result    Flag   Units      Ref Range   Creatinine              33               mg/dL      >=20 ==================================================================== Declared Medications:  The flagging and interpretation on this report are based on the  following declared medications.  Unexpected results may arise from  inaccuracies in the declared medications.  **Note: The testing scope of this panel includes these medications:  Gabapentin (Neurontin)  Hydrocodone (Norco)  Prochlorperazine (Compazine)  Tramadol (Ultram)  **Note: The testing scope of this panel does not include small to  moderate amounts of these reported medications:  Acetaminophen (Excedrin)  Acetaminophen (Norco)  Aspirin (Excedrin)  Lidocaine (Lidocaine-Prilocaine)  **Note: The testing scope of this panel does not include following  reported medications:  Albuterol  Caffeine (Excedrin)  Canagliflozin (Invokana)  Insulin  Ondansetron (Zofran)  Prilocaine (Lidocaine-Prilocaine)  Vitamin D3 ==================================================================== For clinical consultation, please call [phone]. ====================================================================     Laboratory Chemistry Profile   Renal Lab Results  Component Value Date   BUN 10 06/03/2019   CREATININE 0.80 06/03/2019   BCR 13 06/03/2019   GFRAA 99 06/03/2019   GFRNONAA 86 06/03/2019     Hepatic Lab Results  Component Value Date   AST 14 (L) 04/19/2019   ALT 15  04/19/2019   ALBUMIN 3.8 04/19/2019   ALKPHOS 79 04/19/2019     Electrolytes Lab Results  Component Value Date   NA 140 06/03/2019   K 3.7 06/03/2019   CL 101 06/03/2019   CALCIUM 9.2 06/03/2019   MG 1.9 11/23/2017     Bone Lab Results  Component Value Date   VD25OH 20.0 (L) 12/13/2017     Inflammation (CRP: Acute Phase) (ESR: Chronic Phase) Lab Results  Component Value Date   CRP 7 01/22/2018   ESRSEDRATE 27 01/22/2018       Note: Above Lab results reviewed.   Imaging  MM DIAG BREAST TOMO BILATERAL CLINICAL DATA:  History of treated left breast cancer status post breast conservation therapy in 2019.  EXAM: DIGITAL DIAGNOSTIC BILATERAL MAMMOGRAM WITH CAD AND TOMO  COMPARISON:  Previous exam(s).  ACR Breast Density Category c: The breast tissue is heterogeneously dense, which may obscure small masses.  FINDINGS: Mammographically, there are no suspicious masses, areas of nonsurgical architectural distortion or microcalcifications in either breast. Expected posttreatment changes in the left breast with moderate breast edema noted. Injectable port overlies the medial right breast.  Mammographic images were processed with CAD.  IMPRESSION: No mammographic evidence of malignancy status post left lumpectomy and radiation therapy.  RECOMMENDATION: Diagnostic mammogram is suggested in 1 year. (Code:[id number]-B-01Y)  I have discussed the findings and recommendations with the patient. Results were also provided in writing at the conclusion of the visit. If applicable, a reminder letter will be sent to the patient regarding the next appointment.  BI-RADS CATEGORY  2: Benign.  Electronically Signed   By: Fidela Salisbury M.D.   On: 09/07/2018 11:40  Assessment  The primary encounter diagnosis was Chronic pain syndrome. Diagnoses of Chronic knee pain (Secondary Area of Pain) (Bilateral) (R>L) and Osteoarthritis of knee (Right) were also pertinent to this  visit.  Plan of Care  Problem-specific:  No problem-specific Assessment & Plan notes found for this encounter.  Carolyn Roy has a current medication list which includes the following long-term medication(s): albuterol, atorvastatin, oxycodone, [START ON 07/21/2019] oxycodone, [START ON 08/20/2019] oxycodone, [START ON 07/29/2019] pregabalin, prochlorperazine, and trazodone.  Pharmacotherapy (Medications Ordered): No orders of the defined types were placed in this encounter.  Orders:  Orders Placed This Encounter  Procedures  . Ambulatory referral to Orthopedic Surgery    Referral Priority:   Routine    Referral Type:   Surgical    Referral Reason:   Specialty Services Required    Requested Specialty:   Orthopedic Surgery    Number of Visits Requested:   1   Follow-up plan:   Return for regular appointment.      Considering:   Possible bilateral lumbar facet RFA  Diagnostic lumbar sympathetic block  Diagnostic right genicular NB  Possible right genicular nerve RFA    Palliative PRN treatment(s):   Therapeutic right IA knee joint injection #3 (steroids)  Diagnostic bilateral lumbar facet block #2  Therapeutic/palliative right knee Hyalgan inj. S2/N1      Recent Visits Date Type Provider Dept  06/25/19 Procedure visit Milinda Pointer, MD [facility]  06/19/19 Telemedicine Milinda Pointer, MD [facility]  04/30/19 Procedure visit Milinda Pointer, MD [facility]  Showing recent visits within past 90 days and meeting all other requirements   Today's Visits Date Type Provider Dept  07/16/19 Telemedicine Milinda Pointer, MD [facility]  Showing today's visits and meeting all other requirements   Future Appointments Date Type Provider Dept  09/18/19 Appointment Milinda Pointer, MD [facility]  Showing future appointments within next 90  days and meeting all other requirements   I discussed the assessment  and treatment plan with the patient. The patient was provided an opportunity to ask questions and all were answered. The patient agreed with the plan and demonstrated an understanding of the instructions.  Patient advised to call back or seek an in-person evaluation if the symptoms or condition worsens.  Duration of encounter: 12 minutes.  Note by: Gaspar Cola, MD Date: 07/16/2019; Time: 8:07 AM

## 2019-07-19 ENCOUNTER — Inpatient Hospital Stay: Payer: No Typology Code available for payment source

## 2019-07-19 ENCOUNTER — Encounter: Payer: Self-pay | Admitting: Internal Medicine

## 2019-07-19 ENCOUNTER — Other Ambulatory Visit: Payer: Self-pay

## 2019-07-19 ENCOUNTER — Inpatient Hospital Stay: Payer: No Typology Code available for payment source | Attending: Internal Medicine

## 2019-07-19 ENCOUNTER — Inpatient Hospital Stay (HOSPITAL_BASED_OUTPATIENT_CLINIC_OR_DEPARTMENT_OTHER): Payer: No Typology Code available for payment source | Admitting: Internal Medicine

## 2019-07-19 DIAGNOSIS — Z8379 Family history of other diseases of the digestive system: Secondary | ICD-10-CM | POA: Diagnosis not present

## 2019-07-19 DIAGNOSIS — F329 Major depressive disorder, single episode, unspecified: Secondary | ICD-10-CM | POA: Insufficient documentation

## 2019-07-19 DIAGNOSIS — Z8349 Family history of other endocrine, nutritional and metabolic diseases: Secondary | ICD-10-CM | POA: Diagnosis not present

## 2019-07-19 DIAGNOSIS — C50512 Malignant neoplasm of lower-outer quadrant of left female breast: Secondary | ICD-10-CM

## 2019-07-19 DIAGNOSIS — E785 Hyperlipidemia, unspecified: Secondary | ICD-10-CM | POA: Insufficient documentation

## 2019-07-19 DIAGNOSIS — Z90721 Acquired absence of ovaries, unilateral: Secondary | ICD-10-CM | POA: Diagnosis not present

## 2019-07-19 DIAGNOSIS — Z170 Estrogen receptor positive status [ER+]: Secondary | ICD-10-CM | POA: Insufficient documentation

## 2019-07-19 DIAGNOSIS — M549 Dorsalgia, unspecified: Secondary | ICD-10-CM | POA: Diagnosis not present

## 2019-07-19 DIAGNOSIS — Z8042 Family history of malignant neoplasm of prostate: Secondary | ICD-10-CM | POA: Insufficient documentation

## 2019-07-19 DIAGNOSIS — Z803 Family history of malignant neoplasm of breast: Secondary | ICD-10-CM | POA: Insufficient documentation

## 2019-07-19 DIAGNOSIS — F17210 Nicotine dependence, cigarettes, uncomplicated: Secondary | ICD-10-CM | POA: Insufficient documentation

## 2019-07-19 DIAGNOSIS — Z800 Family history of malignant neoplasm of digestive organs: Secondary | ICD-10-CM | POA: Insufficient documentation

## 2019-07-19 DIAGNOSIS — G4700 Insomnia, unspecified: Secondary | ICD-10-CM | POA: Insufficient documentation

## 2019-07-19 DIAGNOSIS — Z5111 Encounter for antineoplastic chemotherapy: Secondary | ICD-10-CM | POA: Diagnosis not present

## 2019-07-19 DIAGNOSIS — M5412 Radiculopathy, cervical region: Secondary | ICD-10-CM

## 2019-07-19 DIAGNOSIS — G8929 Other chronic pain: Secondary | ICD-10-CM | POA: Diagnosis not present

## 2019-07-19 DIAGNOSIS — R202 Paresthesia of skin: Secondary | ICD-10-CM

## 2019-07-19 DIAGNOSIS — R200 Anesthesia of skin: Secondary | ICD-10-CM

## 2019-07-19 DIAGNOSIS — R5383 Other fatigue: Secondary | ICD-10-CM | POA: Insufficient documentation

## 2019-07-19 DIAGNOSIS — Z886 Allergy status to analgesic agent status: Secondary | ICD-10-CM | POA: Diagnosis not present

## 2019-07-19 DIAGNOSIS — E119 Type 2 diabetes mellitus without complications: Secondary | ICD-10-CM | POA: Diagnosis not present

## 2019-07-19 DIAGNOSIS — M542 Cervicalgia: Secondary | ICD-10-CM

## 2019-07-19 DIAGNOSIS — M2550 Pain in unspecified joint: Secondary | ICD-10-CM | POA: Diagnosis not present

## 2019-07-19 DIAGNOSIS — Z853 Personal history of malignant neoplasm of breast: Secondary | ICD-10-CM

## 2019-07-19 DIAGNOSIS — Z79899 Other long term (current) drug therapy: Secondary | ICD-10-CM | POA: Diagnosis not present

## 2019-07-19 LAB — COMPREHENSIVE METABOLIC PANEL
ALT: 17 U/L (ref 0–44)
AST: 16 U/L (ref 15–41)
Albumin: 3.8 g/dL (ref 3.5–5.0)
Alkaline Phosphatase: 111 U/L (ref 38–126)
Anion gap: 9 (ref 5–15)
BUN: 10 mg/dL (ref 6–20)
CO2: 27 mmol/L (ref 22–32)
Calcium: 9.1 mg/dL (ref 8.9–10.3)
Chloride: 102 mmol/L (ref 98–111)
Creatinine, Ser: 0.8 mg/dL (ref 0.44–1.00)
GFR calc Af Amer: >60 mL/min (ref >60)
GFR calc non Af Amer: >60 mL/min (ref >60)
Glucose, Bld: 329 mg/dL — ABNORMAL HIGH (ref 70–99)
Potassium: 4.1 mmol/L (ref 3.5–5.1)
Sodium: 138 mmol/L (ref 135–145)
Total Bilirubin: 0.7 mg/dL (ref 0.3–1.2)
Total Protein: 7.1 g/dL (ref 6.5–8.1)

## 2019-07-19 LAB — CBC WITH DIFFERENTIAL/PLATELET
Abs Immature Granulocytes: 0.04 10*3/uL (ref 0.00–0.07)
Basophils Absolute: 0 10*3/uL (ref 0.0–0.1)
Basophils Relative: 0 %
Eosinophils Absolute: 0 10*3/uL (ref 0.0–0.5)
Eosinophils Relative: 0 %
HCT: 46.7 % — ABNORMAL HIGH (ref 36.0–46.0)
Hemoglobin: 15.8 g/dL — ABNORMAL HIGH (ref 12.0–15.0)
Immature Granulocytes: 0 %
Lymphocytes Relative: 11 %
Lymphs Abs: 1.2 10*3/uL (ref 0.7–4.0)
MCH: 31.4 pg (ref 26.0–34.0)
MCHC: 33.8 g/dL (ref 30.0–36.0)
MCV: 92.8 fL (ref 80.0–100.0)
Monocytes Absolute: 0.4 10*3/uL (ref 0.1–1.0)
Monocytes Relative: 4 %
Neutro Abs: 9.2 10*3/uL — ABNORMAL HIGH (ref 1.7–7.7)
Neutrophils Relative %: 85 %
Platelets: 176 10*3/uL (ref 150–400)
RBC: 5.03 MIL/uL (ref 3.87–5.11)
RDW: 12.9 % (ref 11.5–15.5)
WBC: 10.9 10*3/uL — ABNORMAL HIGH (ref 4.0–10.5)
nRBC: 0 % (ref 0.0–0.2)

## 2019-07-19 MED ORDER — ZOLEDRONIC ACID 4 MG/100ML IV SOLN
4.0000 mg | Freq: Once | INTRAVENOUS | Status: AC
  Administered 2019-07-19: 4 mg via INTRAVENOUS
  Filled 2019-07-19: qty 100

## 2019-07-19 MED ORDER — GOSERELIN ACETATE 10.8 MG ~~LOC~~ IMPL: 10.8000 mg | DRUG_IMPLANT | Freq: Once | SUBCUTANEOUS | Status: DC

## 2019-07-19 MED ORDER — SODIUM CHLORIDE 0.9 % IV SOLN
Freq: Once | INTRAVENOUS | Status: AC
  Filled 2019-07-19: qty 250

## 2019-07-19 MED ORDER — HEPARIN SOD (PORK) LOCK FLUSH 100 UNIT/ML IV SOLN
500.0000 [IU] | Freq: Once | INTRAVENOUS | Status: AC
  Administered 2019-07-19: 500 [IU] via INTRAVENOUS
  Filled 2019-07-19: qty 5

## 2019-07-19 MED ORDER — ZOLEDRONIC ACID 4 MG/5ML IV CONC: 4.0000 mg | Freq: Once | INTRAVENOUS | Status: DC

## 2019-07-19 MED ORDER — HEPARIN SOD (PORK) LOCK FLUSH 100 UNIT/ML IV SOLN
INTRAVENOUS | Status: AC
  Filled 2019-07-19: qty 5

## 2019-07-19 NOTE — Progress Notes (Signed)
[place] CONSULT NOTE  Patient Care Team: Guadalupe Maple, MD as PCP - General (Family Medicine) Bary Castilla, [name], MD (General Surgery) [name], [name], [name] (Obstetrics and Gynecology) Cammie Sickle, MD as Consulting Physician (Oncology) Vanita Ingles, RN as Case Manager (General Practice)  CHIEF COMPLAINTS/PURPOSE OF CONSULTATION: Breast cancer  Oncology History Overview Note  # MAY 2019-  clinical stage IIIA (T3N1Mx) left breast cancer s/p biopsy on 06/14/2017. -Pathology revealed grade III invasive ductal carcinoma. -Axillary FNA revealed malignant cells c/w metastatic carcinoma. Tumor was ER + (90%), PR + (30%), Her2/neu - and Ki67 70%.  CA27.29 was 7.8 on 06/14/2017.  # She received 4 cycles of AC with Neulasta support (07/20/2017 - 08/31/2017).;  neoadjuvant Taxol on 09/14/2017.  #DEC 2019- Lumpectomy/sentinel lymph node biopsy [Dr.[name]]-complete pathologic response  # s/p RT [delayed sec to wound infection; Dr.[name]] finished RT [4/12]  # April 14th 2020- START TAM; stopped in mid-May secondary intolerance [severe migraines].  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 Secord/Berchuck]-negative for malignancy/ # DM-2- poorly controlled.   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   # PAP SMEAR- RECOMMENDED 2022- summer  -------------------------------------------  DIAGNOSIS: left breast cancer  STAGE:  III       ;GOALS: cure  CURRENT/MOST RECENT THERAPY Tam    Cancer of midline of breast, left (HCC)  06/15/2017 Initial Diagnosis   Cancer of midline of breast, left ([place])   06/26/2017 -  Chemotherapy   The patient had DOXOrubicin (ADRIAMYCIN) chemo injection 122 mg, 60 mg/m2 = 122 mg, Intravenous,  Once,  4 of 4 cycles Administration: 122 mg (07/20/2017), 122 mg (08/03/2017), 122 mg (08/17/2017), 122 mg (08/31/2017) palonosetron (ALOXI) injection 0.25 mg, 0.25 mg, Intravenous,  Once, 4 of 4 cycles Administration: 0.25 mg (07/20/2017), 0.25 mg (08/03/2017), 0.25 mg (08/17/2017), 0.25 mg (08/31/2017) pegfilgrastim (NEULASTA) injection 6 mg, 6 mg, Subcutaneous, Once, 5 of 5 cycles Administration: 6 mg (07/21/2017), 6 mg (08/04/2017), 6 mg (08/18/2017), 6 mg (09/01/2017) cyclophosphamide (CYTOXAN) 1,220 mg in sodium chloride 0.9 % 250 mL chemo infusion, 600 mg/m2 = 1,220 mg, Intravenous,  Once, 4 of 4 cycles Administration: 1,220 mg (07/20/2017), 1,220 mg (08/03/2017), 1,220 mg (08/17/2017), 1,220 mg (08/31/2017) PACLitaxel (TAXOL) 162 mg in sodium chloride 0.9 % 250 mL chemo infusion (</= 59m/m2), 80 mg/m2 = 162 mg, Intravenous,  Once, 10 of 12 cycles Dose modification: 65 mg/m2 (original dose 80 mg/m2, Cycle 13, Reason: Provider Judgment, Comment: neuropathy) Administration: 162 mg (09/14/2017), 162 mg (09/21/2017), 162 mg (09/28/2017), 162 mg (10/05/2017), 162 mg (10/12/2017), 162 mg (10/19/2017), 162 mg (10/26/2017), 132 mg (11/02/2017), 132 mg (11/09/2017), 132 mg (11/16/2017) fosaprepitant (EMEND) 150 mg, dexamethasone (DECADRON) 12 mg in sodium chloride 0.9 % 145 mL IVPB, , Intravenous,  Once, 4 of 4 cycles Administration:  (07/20/2017),  (08/03/2017),  (08/17/2017),  (08/31/2017)  for chemotherapy treatment.    Malignant neoplasm of lower-outer quadrant of left breast of female, estrogen receptor positive ([place]  11/08/2017 Initial Diagnosis   Malignant neoplasm of lower-outer quadrant of left breast of female, estrogen receptor positive ([place]    HISTORY OF PRESENTING ILLNESS:   CKendell GammonWhite 54y.o.  female with a history of stage III ER PR positive HER-2/neu negative breast cancer currently on Arimidex plus Zoladex is here for follow-up.  Patient denies any new lumps or bumps.  Continues her chronic joint pains  back pain not any worse.  No fevers or chills.  Chronic mild fatigue.  Review of Systems  Constitutional: Positive for malaise/fatigue. Negative for chills, diaphoresis, fever and weight loss.  HENT: Negative for nosebleeds and sore throat.   Eyes: Negative for double vision.  Respiratory: Negative for cough, hemoptysis, sputum production, shortness of breath and wheezing.   Cardiovascular: Negative for chest pain, palpitations, orthopnea and leg swelling.  Gastrointestinal: Negative for abdominal pain, blood in stool, constipation, diarrhea, heartburn, melena, nausea and vomiting.  Genitourinary: Negative for dysuria, frequency and urgency.  Musculoskeletal: Positive for joint pain. Negative for back pain.  Skin: Negative.  Negative for itching and rash.  Neurological: Positive for tingling. Negative for dizziness, focal weakness, weakness and headaches.  Endo/Heme/Allergies: Does not bruise/bleed easily.  Psychiatric/Behavioral: Negative for depression. The patient is not nervous/anxious and does not have insomnia.      MEDICAL HISTORY:  Past Medical History:  Diagnosis Date   Breast cancer ([place])    Cancer ([place]) 06/15/2017   5.1 cm, T3,N1 (clinical): ER/ PR positive, Her 2 neu not overexpressed, High Ki 67. Neuoadjuvant chemotherapy.    Depression    Diabetes mellitus without complication ([place]) 6269   Endometriosis    Family history of breast cancer    Headache    migraines   Hyperlipidemia    Ovarian mass    Personal history of chemotherapy    Personal history of radiation therapy    Pneumonia    2018    SURGICAL HISTORY: Past Surgical History:  Procedure Laterality Date   AXILLARY LYMPH NODE BIOPSY Left 07/14/2017   Procedure: INSERTION GEL MARK CLIP LEFT AXILLA;  Surgeon: Robert Bellow, MD;  Location: [facility] ORS;  Service: General;  Laterality: Left;   BREAST BIOPSY Left    Dr Orlene Och BREAST METASTATIC CARCINOMA   BREAST LUMPECTOMY Left 01/12/2018    OOPHORECTOMY     PARTIAL MASTECTOMY WITH NEEDLE LOCALIZATION Left 01/12/2018   Procedure: PARTIAL MASTECTOMY WITH NEEDLE LOCALIZATION;  Surgeon: Robert Bellow, MD;  Location: [facility] ORS;  Service: General;  Laterality: Left;   PORTACATH PLACEMENT Right 07/14/2017   Procedure: INSERTION PORT-A-CATH;  Surgeon: Robert Bellow, MD;  Location: [facility] ORS;  Service: General;  Laterality: Right;   SENTINEL NODE BIOPSY Left 01/12/2018   Procedure: SENTINEL NODE BIOPSY;  Surgeon: Robert Bellow, MD;  Location: [facility] ORS;  Service: General;  Laterality: Left;   TUBAL LIGATION      SOCIAL HISTORY: Social History   Socioeconomic History   Marital status: Married    Spouse name: Not on file   Number of children: Not on file   Years of education: Not on file   Highest education level: Not on file  Occupational History   Not on file  Tobacco Use   Smoking status: Current Every Day Smoker    Packs/day: 0.50    Years: 11.00    Pack years: 5.50    Types: Cigarettes   Smokeless tobacco: Former Systems developer    Types: Snuff  Vaping Use   Vaping Use: Never used  Substance and Sexual Activity   Alcohol use: No    Alcohol/week: 0.0 standard drinks   Drug use: No   Sexual activity: Yes  Other Topics Concern   Not on file  Social History Narrative   Not on file   Social Determinants of Health   Financial Resource Strain: Low Risk  Difficulty of Paying Living Expenses: Not very hard  Food Insecurity:    Worried About Charity fundraiser in the Last Year:    Arboriculturist in the Last Year:   Transportation Needs:    Film/video editor (Medical):    Lack of Transportation (Non-Medical):   Physical Activity:    Days of Exercise per Week:    Minutes of Exercise per Session:   Stress:    Feeling of Stress :   Social Connections:    Frequency of Communication with Friends and Family:    Frequency of Social Gatherings with Friends and Family:    Attends  Religious Services:    Active Member of Clubs or Organizations:    Attends Music therapist:    Marital Status:   Intimate Partner Violence:    Fear of Current or Ex-Partner:    Emotionally Abused:    Physically Abused:    Sexually Abused:     FAMILY HISTORY: Family History  Problem Relation Age of Onset   Colon cancer Mother    Other Father        No info about father or paternal relatives   Diabetes Brother    Pancreatitis Brother    Prostate cancer Brother 90       currently 51 / maternal half-brother   Breast cancer Maternal Grandmother 31       deceased 55s   Breast cancer Maternal Aunt 17       currently 43   Breast cancer Other 78       mother's sister; deceased 33   Breast cancer Other        mother's sister; age at dx unknown    ALLERGIES:  is allergic to aspirin.  MEDICATIONS:  Current Outpatient Medications  Medication Sig Dispense Refill   albuterol (PROVENTIL HFA;VENTOLIN HFA) 108 (90 Base) MCG/ACT inhaler Inhale 2 puffs into the lungs every 6 (six) hours as needed for wheezing or shortness of breath. 1 Inhaler 0   anastrozole (ARIMIDEX) 1 MG tablet Take 1 tablet (1 mg total) by mouth daily. 90 tablet 3   aspirin-acetaminophen-caffeine (EXCEDRIN MIGRAINE) 250-250-65 MG tablet Take 2 tablets by mouth daily as needed for headache.     atorvastatin (LIPITOR) 20 MG tablet Take 1 tablet (20 mg total) by mouth daily. 90 tablet 3   canagliflozin (INVOKANA) 300 MG TABS tablet Take 1 tablet (300 mg total) by mouth daily. 90 tablet 3   DULoxetine (CYMBALTA) 60 MG capsule Take 60 mg by mouth daily.     glucose blood (ONE TOUCH ULTRA TEST) test strip Use up to 4 times/day 100 each 12   goserelin (ZOLADEX) 3.6 MG injection Inject 3.6 mg into the skin every 28 (twenty-eight) days.     hydrOXYzine (VISTARIL) 50 MG capsule Take 50 mg by mouth 2 (two) times daily as needed.     oxyCODONE (OXY IR/ROXICODONE) 5 MG immediate release tablet  Take 1 tablet (5 mg total) by mouth 2 (two) times daily as needed for severe pain. Must last 30 days 60 tablet 0   prochlorperazine (COMPAZINE) 10 MG tablet Take 1 tablet (10 mg total) by mouth every 6 (six) hours as needed (Nausea or vomiting). 30 tablet 1   traZODone (DESYREL) 50 MG tablet TAKE 1-2 TABLETS (50-100 MG TOTAL) BY MOUTH AT BEDTIME AS NEEDED FOR SLEEP. (Patient taking differently: Take 100 mg by mouth at bedtime as needed for sleep. ) 180 tablet 1   lidocaine-prilocaine (  EMLA) cream Apply to affected area once (Patient not taking: Reported on 07/19/2019) 30 g 3   ondansetron (ZOFRAN) 8 MG tablet Take 1 tablet (8 mg total) by mouth 2 (two) times daily as needed. Start on the third day after chemotherapy. (Patient not taking: Reported on 07/19/2019) 30 tablet 1   oxyCODONE (OXY IR/ROXICODONE) 5 MG immediate release tablet Take 1 tablet (5 mg total) by mouth 2 (two) times daily as needed for severe pain. Must last 30 days 60 tablet 0   [START ON 08/20/2019] oxyCODONE (OXY IR/ROXICODONE) 5 MG immediate release tablet Take 1 tablet (5 mg total) by mouth 2 (two) times daily as needed for severe pain. Must last 30 days 60 tablet 0   [START ON 07/29/2019] pregabalin (LYRICA) 150 MG capsule Take 1 capsule (150 mg total) by mouth 2 (two) times daily. 60 capsule 2   Semaglutide,0.25 or 0.5MG/DOS, (OZEMPIC, 0.25 OR 0.5 MG/DOSE,) 2 MG/1.5ML SOPN Inject 0.25 MG into the skin weekly x 4 weeks and then increase to injecting 0.5 MG into the skin weekly. (Patient not taking: Reported on 07/19/2019) 4 pen 6   No current facility-administered medications for this visit.   Facility-Administered Medications Ordered in Other Visits  Medication Dose Route Frequency Provider Last Rate Last Admin   heparin lock flush 100 unit/mL  500 Units Intravenous Once Corcoran, Melissa C, MD       sodium chloride flush (NS) 0.9 % injection 10 mL  10 mL Intravenous Once Lequita Asal, MD          .  PHYSICAL  EXAMINATION: ECOG PERFORMANCE STATUS: 0 - Asymptomatic  Vitals:   07/19/19 1356  BP: 100/76  Pulse: 89  Resp: 20  Temp: 98.3 F (36.8 C)  SpO2: 94%   Filed Weights   07/19/19 1356  Weight: 173 lb 12.8 oz (78.8 kg)    Physical Exam HENT:     Head: Normocephalic and atraumatic.     Mouth/Throat:     Pharynx: No oropharyngeal exudate.  Eyes:     Pupils: Pupils are equal, round, and reactive to light.  Cardiovascular:     Rate and Rhythm: Normal rate and regular rhythm.  Pulmonary:     Effort: No respiratory distress.     Breath sounds: No wheezing.  Abdominal:     General: Bowel sounds are normal. There is no distension.     Palpations: Abdomen is soft. There is no mass.     Tenderness: There is no abdominal tenderness. There is no guarding or rebound.  Musculoskeletal:        General: No tenderness. Normal range of motion.     Cervical back: Normal range of motion and neck supple.  Skin:    General: Skin is warm.  Neurological:     Mental Status: She is alert and oriented to person, place, and time.  Psychiatric:        Mood and Affect: Affect normal.      LABORATORY DATA:  I have reviewed the data as listed Lab Results  Component Value Date   WBC 10.9 (H) 07/19/2019   HGB 15.8 (H) 07/19/2019   HCT 46.7 (H) 07/19/2019   MCV 92.8 07/19/2019   PLT 176 07/19/2019   Recent Labs    01/18/19 1312 03/06/19 1613 04/19/19 1028 06/03/19 1435 07/19/19 1326  NA   < > 138 139 140 138  K   < > 3.9 4.6 3.7 4.1  CL   < > 100 103  101 102  CO2   < > '24 28 24 27  ' GLUCOSE   < > 320* 256* 339* 329*  BUN   < > '10 16 10 10  ' CREATININE   < > 0.61 0.49 0.80 0.80  CALCIUM   < > 9.6 9.3 9.2 9.1  GFRNONAA   < > 105 >60 86 >60  GFRAA   < > 121 >60 99 >60  PROT  --  6.2 6.5  --  7.1  ALBUMIN  --  4.1 3.8  --  3.8  AST  --  11 14*  --  16  ALT  --  12 15  --  17  ALKPHOS  --  104 79  --  111  BILITOT  --  0.3 0.6  --  0.7   < > = values in this interval not displayed.     RADIOGRAPHIC STUDIES: I have personally reviewed the radiological images as listed and agreed with the findings in the report. No results found.  ASSESSMENT & PLAN:   Malignant neoplasm of lower-outer quadrant of left breast of female, estrogen receptor positive ([place]) # breast cancer Stage III ER/PR pos her 2 NEG. currently on anastrazole+ Zoldadex.  No clinical evidence of recurrence.  Stable  #Continue current therapy; tolerating fairly well except for continued joint pains.  Zometa q 6 months; zometa today.  Calcium normal  # Depression/ Insomnia- STABLE;  On cymblata-[ Dr.kapur]  # Joint pains-stable sec to AI-  on vit D/continue Osteo Bi-Flex/Neurontin.  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  Stable  # DM-2 on insulin- stable; Blood glucose-329.  Defer to PCP  # DISPOSITION: # Zoladex  today.  # Follow up in 3 month;  MD;cbc/cmp/ca-27-29;# Zoladex/ Zometa today. Dr.B  All questions were answered. The patient knows to call the clinic with any problems, questions or concerns.    Cammie Sickle, MD 07/21/2019 6:36 PM

## 2019-07-19 NOTE — Assessment & Plan Note (Addendum)
#   breast cancer Stage III ER/PR pos her 2 NEG. currently on anastrazole+ Zoldadex.  No clinical evidence of recurrence.  Stable  #Continue current therapy; tolerating fairly well except for continued joint pains.  Zometa q 6 months; zometa today.  Calcium normal  # Depression/ Insomnia- STABLE;  On cymblata-[ Dr.kapur]  # Joint pains-stable sec to AI-  on vit D/continue Osteo Bi-Flex/Neurontin.  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  Stable  # DM-2 on insulin- stable; Blood glucose-329.  Defer to PCP  # DISPOSITION: # Zoladex  today.  # Follow up in 3 month;  MD;cbc/cmp/ca-27-29;# Zoladex/ Zometa today. Dr.B

## 2019-07-19 NOTE — Progress Notes (Signed)
1435: Port accessed, flushes without difficulty, no pain, swelling or redness noted, no blood return noted. Dr. Rogue Bussing aware. Per Dr. Rogue Bussing okay to proceed with Zometa treatment using port.   Per Pharmacy, we will not have correct Zoladex dose until 07/24/19, pt agrees to return to clinic for Zoladex on 07/24/19 at 2:15pm. Dr. Rogue Bussing aware and agrees with plan.

## 2019-07-20 LAB — CANCER ANTIGEN 27.29: CA 27.29: 10.9 U/mL (ref 0.0–38.6)

## 2019-07-24 ENCOUNTER — Inpatient Hospital Stay: Payer: No Typology Code available for payment source

## 2019-07-24 ENCOUNTER — Other Ambulatory Visit: Payer: Self-pay

## 2019-07-24 DIAGNOSIS — C50512 Malignant neoplasm of lower-outer quadrant of left female breast: Secondary | ICD-10-CM

## 2019-07-24 DIAGNOSIS — Z170 Estrogen receptor positive status [ER+]: Secondary | ICD-10-CM

## 2019-07-24 DIAGNOSIS — Z5111 Encounter for antineoplastic chemotherapy: Secondary | ICD-10-CM | POA: Diagnosis not present

## 2019-07-24 MED ORDER — GOSERELIN ACETATE 10.8 MG ~~LOC~~ IMPL
10.8000 mg | DRUG_IMPLANT | Freq: Once | SUBCUTANEOUS | Status: AC
  Administered 2019-07-24: 10.8 mg via SUBCUTANEOUS
  Filled 2019-07-24: qty 10.8

## 2019-07-30 ENCOUNTER — Ambulatory Visit: Payer: Self-pay | Admitting: General Practice

## 2019-07-30 ENCOUNTER — Telehealth: Payer: Self-pay

## 2019-07-30 NOTE — Chronic Care Management (AMB) (Signed)
  Chronic Care Management   Outreach Note  07/30/2019 Name: Carolyn Roy MRN: [id number] DOB: 07/10/1965  Referred by: Guadalupe Maple, MD Reason for referral : Care Coordination (Initial outreach: 2nd attempt for care coordination needs)   A second unsuccessful telephone outreach was attempted today. The patient was referred to the case management team for assistance with care management and care coordination. An in basket secure chat has been sent to the patient by the [facility] also.   Follow Up Plan: A HIPPA compliant phone message was left for the patient providing contact information and requesting a return call.   Noreene Larsson RN, MSN, [facility] Mobile: [phone]

## 2019-09-04 ENCOUNTER — Ambulatory Visit: Payer: Self-pay | Admitting: Pharmacist

## 2019-09-04 DIAGNOSIS — E1165 Type 2 diabetes mellitus with hyperglycemia: Secondary | ICD-10-CM

## 2019-09-04 DIAGNOSIS — Z794 Long term (current) use of insulin: Secondary | ICD-10-CM

## 2019-09-04 DIAGNOSIS — F322 Major depressive disorder, single episode, severe without psychotic features: Secondary | ICD-10-CM

## 2019-09-04 NOTE — Chronic Care Management (AMB) (Signed)
Chronic Care Management   Follow Up Note   09/04/2019 Name: Carolyn Roy MRN: [id number] DOB: [birth year]  Referred by: Guadalupe Maple, MD Reason for referral : Chronic Care Management (Medication Management)   Carolyn Roy is a 54 y.o. year old female who is a primary care patient of Crissman, Jeannette How, MD. The CCM team was consulted for assistance with chronic disease management and care coordination needs.    Contacted patient for medication management review.   Review of patient status, including review of consultants reports, relevant laboratory and other test results, and collaboration with appropriate care team members and the patient's provider was performed as part of comprehensive patient evaluation and provision of chronic care management services.    SDOH (Social Determinants of Health) assessments performed: Yes See Care Plan activities for detailed interventions related to [facility])     Outpatient Encounter Medications as of 09/04/2019  Medication Sig Note  . anastrozole (ARIMIDEX) 1 MG tablet Take 1 tablet (1 mg total) by mouth daily.   [name] atorvastatin (LIPITOR) 20 MG tablet Take 1 tablet (20 mg total) by mouth daily.   . canagliflozin (INVOKANA) 300 MG TABS tablet Take 1 tablet (300 mg total) by mouth daily.   . DULoxetine (CYMBALTA) 60 MG capsule Take 60 mg by mouth daily.   [name] glucose blood (ONE TOUCH ULTRA TEST) test strip Use up to 4 times/day   . goserelin (ZOLADEX) 3.6 MG injection Inject 3.6 mg into the skin every 28 (twenty-eight) days. 12/11/2018: Patient states she gets this every 3 months  . oxyCODONE (OXY IR/ROXICODONE) 5 MG immediate release tablet Take 1 tablet (5 mg total) by mouth 2 (two) times daily as needed for severe pain. Must last 30 days   . pregabalin (LYRICA) 150 MG capsule Take 1 capsule (150 mg total) by mouth 2 (two) times daily.   . Semaglutide,0.25 or 0.5MG/DOS, (OZEMPIC, 0.25 OR 0.5 MG/DOSE,) 2 MG/1.5ML SOPN Inject 0.25 MG into the skin  weekly x 4 weeks and then increase to injecting 0.5 MG into the skin weekly.   . sertraline (ZOLOFT) 50 MG tablet Take 50 mg by mouth every morning.   . temazepam (RESTORIL) 15 MG capsule Take 15 mg by mouth daily as needed.   [name] albuterol (PROVENTIL HFA;VENTOLIN HFA) 108 (90 Base) MCG/ACT inhaler Inhale 2 puffs into the lungs every 6 (six) hours as needed for wheezing or shortness of breath. (Patient not taking: Reported on 09/04/2019)   . aspirin-acetaminophen-caffeine (EXCEDRIN MIGRAINE) 250-250-65 MG tablet Take 2 tablets by mouth daily as needed for headache. (Patient not taking: Reported on 09/04/2019)   . hydrOXYzine (VISTARIL) 50 MG capsule Take 50 mg by mouth 2 (two) times daily as needed. (Patient not taking: Reported on 09/04/2019) 07/03/2019: QPM  . lidocaine-prilocaine (EMLA) cream Apply to affected area once (Patient not taking: Reported on 07/19/2019) 03/06/2019: As needed  . ondansetron (ZOFRAN) 8 MG tablet Take 1 tablet (8 mg total) by mouth 2 (two) times daily as needed. Start on the third day after chemotherapy. (Patient not taking: Reported on 07/19/2019)   . oxyCODONE (OXY IR/ROXICODONE) 5 MG immediate release tablet Take 1 tablet (5 mg total) by mouth 2 (two) times daily as needed for severe pain. Must last 30 days   . oxyCODONE (OXY IR/ROXICODONE) 5 MG immediate release tablet Take 1 tablet (5 mg total) by mouth 2 (two) times daily as needed for severe pain. Must last 30 days   . prochlorperazine (COMPAZINE) 10 MG tablet Take  1 tablet (10 mg total) by mouth every 6 (six) hours as needed (Nausea or vomiting). (Patient not taking: Reported on 09/04/2019)   . [DISCONTINUED] traZODone (DESYREL) 50 MG tablet TAKE 1-2 TABLETS (50-100 MG TOTAL) BY MOUTH AT BEDTIME AS NEEDED FOR SLEEP. (Patient taking differently: Take 100 mg by mouth at bedtime as needed for sleep. )    Facility-Administered Encounter Medications as of 09/04/2019  Medication  . heparin lock flush 100 unit/mL  . sodium chloride  flush (NS) 0.9 % injection 10 mL     Objective:   Goals Addressed              This Visit's Progress     Patient Stated   .  PharmD "I want to stay healthy" (pt-stated)        CARE PLAN ENTRY (see longtitudinal plan of care for additional care plan information)  Current Barriers:  . Financial, social, or community barriers:  o Patient reports that her mother passed away from colon cancer last month. She notes improvement in her mood with recent medication changes.  . Diabetes: uncontrolled; complicated by chronic medical conditions including CKD, breast cancer, COPD, chronic pain, depression, HLD, most recent A1c 9.3% . Most recent eGFR: >60 mL/min . Current antihyperglycemic regimen: Invokana 300 mg daily, Ozempic 0.5 mg weekly x 3 weeks; denies any GI upset w/ Ozempic . Reports improvement in polyuria, neuropathy with Ozempic  . Current fastings:  o Fastings: 95-105s o 2 hours after meal: 180-240s . Current meal patterns:  o Breakfast: egg sandwich, apple sauce o Supper: Pork chops, gravy, rice; fresh sliced tomatoes  o Drinks: Cutting back on sodas, drinking more water lately (4-5 bottles of water at work daily) o Snacks/desserts: peanut butter crackers, occasionally fruit . Current exercise: none . Cardiovascular risk reduction: o Current hypertensive regimen: n/a o Current hyperlipidemia regimen: atorvastatin 20 mg daily, baseline LDL 129, 10 year ASCVD risk 5.9% o Current antiplatelet regimen: n/a (hx intolerance to ASA) . 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 Continues to follow w/ hem/onc . Depression; Dr. Milbert Coulter; duloxetine 60 mg daily, sertraline 50 mg daily, temazepam 15 mg - has not needed yet, script just got filled. Reports improvement in mood, energy, sleep w/ sertraline . Chronic pain; Dr. Consuela Mimes; oxycodone 5 BID, pregabalin 150 mg BID . Reports she needs to set up a  colonoscopy ASAP, mother just passed from colon cancer. Plans to call and schedule soon  Pharmacist Clinical Goal(s):  [name] Over the next 90 days, patient will work with PharmD and primary care provider to address optimized medication management  Interventions: . Comprehensive medication review performed, medication list updated in electronic medical record . Inter-disciplinary care team collaboration (see longitudinal plan of care) . Discussed goal A1c, goal fasting, and goal 2 hour post prandial glucoses. Discussed potential for increasing Ozempic to 1 mg after at least 4 weeks of Ozempic 0.5 mg weekly. She notes she will discuss this w/ PCP at next appointment . Discussed improvement in mood w/ medication changes. Notes that Dr. Nicolasa Ducking has recommended she see a grief therapist, and she has a list of recommendations, but she has not have time/energy to call to attempt to schedule. Encourage patient to reach out to therapist on the list. Will also collaborate w/ LCSW to outreach patient for mental health support . Discussed RN CM role on the team, reminded of upcoming appointment.   Patient  Self Care Activities:  . Patient will check blood glucose BID, document, and provide at future appointments . Patient will take medications as prescribed . Patient will report any questions or concerns to provider   Please see past updates related to this goal by clicking on the "Past Updates" button in the selected goal          Plan:  - Scheduled PharmD f/u in ~10 weeks  Catie Darnelle Maffucci, PharmD, [place] [phone]

## 2019-09-04 NOTE — Patient Instructions (Signed)
Visit Information  Goals Addressed              This Visit's Progress     Patient Stated   .  PharmD "I want to stay healthy" (pt-stated)        CARE PLAN ENTRY (see longtitudinal plan of care for additional care plan information)  Current Barriers:  . Financial, social, or community barriers:  o Patient reports that her mother passed away from colon cancer last month. She notes improvement in her mood with recent medication changes.  . Diabetes: uncontrolled; complicated by chronic medical conditions including CKD, breast cancer, COPD, chronic pain, depression, HLD, most recent A1c 9.3% . Most recent eGFR: >60 mL/min . Current antihyperglycemic regimen: Invokana 300 mg daily, Ozempic 0.5 mg weekly x 3 weeks; denies any GI upset w/ Ozempic . Reports improvement in polyuria, neuropathy with Ozempic  . Current fastings:  o Fastings: 95-105s o 2 hours after meal: 180-240s . Current meal patterns:  o Breakfast: egg sandwich, apple sauce o Supper: Pork chops, gravy, rice; fresh sliced tomatoes  o Drinks: Cutting back on sodas, drinking more water lately (4-5 bottles of water at work daily) o Snacks/desserts: peanut butter crackers, occasionally fruit . Current exercise: none . Cardiovascular risk reduction: o Current hypertensive regimen: n/a o Current hyperlipidemia regimen: atorvastatin 20 mg daily, baseline LDL 129, 10 year ASCVD risk 5.9% o Current antiplatelet regimen: n/a (hx intolerance to ASA) . 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 Continues to follow w/ hem/onc . Depression; Dr. Milbert Coulter; duloxetine 60 mg daily, sertraline 50 mg daily, temazepam 15 mg - has not needed yet, script just got filled. Reports improvement in mood, energy, sleep w/ sertraline . Chronic pain; Dr. Consuela Mimes; oxycodone 5 BID, pregabalin 150 mg BID . Reports she needs to set up a colonoscopy ASAP, mother just passed from  colon cancer. Plans to call and schedule soon  Pharmacist Clinical Goal(s):  [name] Over the next 90 days, patient will work with PharmD and primary care provider to address optimized medication management  Interventions: . Comprehensive medication review performed, medication list updated in electronic medical record . Inter-disciplinary care team collaboration (see longitudinal plan of care) . Discussed goal A1c, goal fasting, and goal 2 hour post prandial glucoses. Discussed potential for increasing Ozempic to 1 mg after at least 4 weeks of Ozempic 0.5 mg weekly. She notes she will discuss this w/ PCP at next appointment . Discussed improvement in mood w/ medication changes. Notes that Dr. Nicolasa Ducking has recommended she see a grief therapist, and she has a list of recommendations, but she has not have time/energy to call to attempt to schedule. Encourage patient to reach out to therapist on the list. Will also collaborate w/ LCSW to outreach patient for mental health support . Discussed RN CM role on the team, reminded of upcoming appointment.   Patient Self Care Activities:  . Patient will check blood glucose BID, document, and provide at future appointments . Patient will take medications as prescribed . Patient will report any questions or concerns to provider   Please see past updates related to this goal by clicking on the "Past Updates" button in the selected goal         The patient verbalized understanding of instructions provided today and declined a print copy of patient instruction materials.   Plan:  - Scheduled PharmD f/u in ~10 weeks  Catie Darnelle Maffucci,  PharmD, [place] [phone]

## 2019-09-06 ENCOUNTER — Telehealth: Payer: Self-pay

## 2019-09-06 ENCOUNTER — Telehealth: Payer: Self-pay | Admitting: General Practice

## 2019-09-06 NOTE — Telephone Encounter (Signed)
  Chronic Care Management   Outreach Note  09/06/2019 Name: Carolyn Roy MRN: [id number] DOB: Dec 26, 1965  Referred by: Guadalupe Maple, MD Reason for referral : Appointment (3rd attempt to reach this patient for [facility] chronic Disease Management and Care coordination needs)   Third unsuccessful telephone outreach was attempted today. The patient was referred to the case management team for assistance with care management and care coordination. The patient's primary care provider has been notified of our unsuccessful attempts to make or maintain contact with the patient. The care management team is pleased to engage with this patient at any time in the future should he/she be interested in assistance from the care management team.   Follow Up Plan: The care management team is available to follow up with the patient after provider conversation with the patient regarding recommendation for care management engagement and subsequent re-referral to the care management team.   Noreene Larsson RN, MSN, [facility] Mobile: [phone]

## 2019-09-09 ENCOUNTER — Ambulatory Visit
Admission: RE | Admit: 2019-09-09 | Discharge: 2019-09-09 | Disposition: A | Discharge status: Home / Self Care | Payer: No Typology Code available for payment source | Source: Ambulatory Visit | Attending: Internal Medicine | Admitting: Internal Medicine

## 2019-09-09 ENCOUNTER — Other Ambulatory Visit: Payer: Self-pay

## 2019-09-09 DIAGNOSIS — C50512 Malignant neoplasm of lower-outer quadrant of left female breast: Secondary | ICD-10-CM

## 2019-09-09 DIAGNOSIS — Z170 Estrogen receptor positive status [ER+]: Secondary | ICD-10-CM | POA: Diagnosis present

## 2019-09-16 ENCOUNTER — Ambulatory Visit: Payer: 59 | Admitting: Nurse Practitioner

## 2019-09-18 ENCOUNTER — Ambulatory Visit: Payer: No Typology Code available for payment source | Attending: Pain Medicine | Admitting: Pain Medicine

## 2019-09-18 ENCOUNTER — Encounter: Payer: Self-pay | Admitting: Pain Medicine

## 2019-09-18 ENCOUNTER — Encounter: Payer: Self-pay | Admitting: Surgery

## 2019-09-18 ENCOUNTER — Other Ambulatory Visit: Payer: Self-pay

## 2019-09-18 ENCOUNTER — Ambulatory Visit (INDEPENDENT_AMBULATORY_CARE_PROVIDER_SITE_OTHER): Payer: No Typology Code available for payment source | Admitting: Surgery

## 2019-09-18 VITALS — BP 115/77 | HR 91 | Temp 98.5°F | Resp 12 | Ht 64.0 in | Wt 173.4 lb

## 2019-09-18 VITALS — BP 113/86 | HR 93 | Temp 97.1°F | Resp 18 | Ht 64.0 in | Wt 177.0 lb

## 2019-09-18 DIAGNOSIS — M79641 Pain in right hand: Secondary | ICD-10-CM

## 2019-09-18 DIAGNOSIS — C50512 Malignant neoplasm of lower-outer quadrant of left female breast: Secondary | ICD-10-CM | POA: Diagnosis not present

## 2019-09-18 DIAGNOSIS — G894 Chronic pain syndrome: Secondary | ICD-10-CM | POA: Diagnosis not present

## 2019-09-18 DIAGNOSIS — Z170 Estrogen receptor positive status [ER+]: Secondary | ICD-10-CM | POA: Diagnosis not present

## 2019-09-18 DIAGNOSIS — M25552 Pain in left hip: Secondary | ICD-10-CM

## 2019-09-18 DIAGNOSIS — M25551 Pain in right hip: Secondary | ICD-10-CM | POA: Diagnosis present

## 2019-09-18 DIAGNOSIS — Z79899 Other long term (current) drug therapy: Secondary | ICD-10-CM | POA: Diagnosis present

## 2019-09-18 DIAGNOSIS — M25561 Pain in right knee: Secondary | ICD-10-CM | POA: Diagnosis not present

## 2019-09-18 DIAGNOSIS — M79672 Pain in left foot: Secondary | ICD-10-CM

## 2019-09-18 DIAGNOSIS — M25562 Pain in left knee: Secondary | ICD-10-CM | POA: Diagnosis present

## 2019-09-18 DIAGNOSIS — F1120 Opioid dependence, uncomplicated: Secondary | ICD-10-CM | POA: Diagnosis present

## 2019-09-18 DIAGNOSIS — M79642 Pain in left hand: Secondary | ICD-10-CM

## 2019-09-18 DIAGNOSIS — G8929 Other chronic pain: Secondary | ICD-10-CM | POA: Diagnosis present

## 2019-09-18 DIAGNOSIS — M79671 Pain in right foot: Secondary | ICD-10-CM | POA: Diagnosis not present

## 2019-09-18 MED ORDER — OXYCODONE HCL 5 MG PO TABS: 5.0000 mg | ORAL_TABLET | Freq: Two times a day (BID) | ORAL | 0 refills | Status: DC | PRN

## 2019-09-18 NOTE — Progress Notes (Signed)
09/18/2019  History of Present Illness: Carolyn Roy is a 54 y.o. female presenting for follow up, s/p left breast lumpectomy with SLNBx on 01/12/18 with Dr. Bary Castilla.  She's s/p neoadjuvant chemo prior to surgery and adjuvant radiation.  She's currently on Arimidex.  She's been followed by Dr. Rogue Bussing.  She reports that she's been doing well without any palpable masses, skin changes, or nipple changes.  She does report a very localized area of tenderness that sometimes is shooting type of pain in the right lateral chest wall.  She noticed this about 2 months ago and is constant.  Denies any palpable masses in that area.  Past Medical History: Past Medical History:  Diagnosis Date  . Allergy   . Breast cancer ([place])   . Cancer ([place]) 06/15/2017   5.1 cm, T3,N1 (clinical): ER/ PR positive, Her 2 neu not overexpressed, High Ki 67. Neuoadjuvant chemotherapy.   . Depression   . Diabetes mellitus without complication ([place]) 2355  . Endometriosis   . Family history of breast cancer   . Headache    migraines  . Hyperlipidemia   . Ovarian mass   . Personal history of chemotherapy   . Personal history of radiation therapy   . Pneumonia    2018     Past Surgical History: Past Surgical History:  Procedure Laterality Date  . AXILLARY LYMPH NODE BIOPSY Left 07/14/2017   Procedure: INSERTION GEL MARK CLIP LEFT AXILLA;  Surgeon: Robert Bellow, MD;  Location: [facility] ORS;  Service: General;  Laterality: Left;  . BREAST BIOPSY Left    Dr Orlene Och BREAST METASTATIC CARCINOMA  . BREAST LUMPECTOMY Left 01/12/2018  . OOPHORECTOMY    . PARTIAL MASTECTOMY WITH NEEDLE LOCALIZATION Left 01/12/2018   Procedure: PARTIAL MASTECTOMY WITH NEEDLE LOCALIZATION;  Surgeon: Robert Bellow, MD;  Location: [facility] ORS;  Service: General;  Laterality: Left;  . PORTACATH PLACEMENT Right 07/14/2017   Procedure: INSERTION PORT-A-CATH;  Surgeon: Robert Bellow, MD;  Location: [facility] ORS;  Service: General;   Laterality: Right;  . SENTINEL NODE BIOPSY Left 01/12/2018   Procedure: SENTINEL NODE BIOPSY;  Surgeon: Robert Bellow, MD;  Location: [facility] ORS;  Service: General;  Laterality: Left;  . TUBAL LIGATION      Home Medications: Prior to Admission medications   Medication Sig Start Date End Date Taking? Authorizing Provider  anastrozole (ARIMIDEX) 1 MG tablet Take 1 tablet (1 mg total) by mouth daily. 11/02/18  Yes Verlon Au, NP  aspirin-acetaminophen-caffeine (EXCEDRIN MIGRAINE) [phone] MG tablet Take 2 tablets by mouth daily as needed for headache.    Yes [provider]  atorvastatin (LIPITOR) 20 MG tablet Take 1 tablet (20 mg total) by mouth daily. 06/05/19  Yes Cannady, Jolene T, NP  canagliflozin (INVOKANA) 300 MG TABS tablet Take 1 tablet (300 mg total) by mouth daily. 03/06/19  Yes Cannady, Jolene T, NP  DULoxetine (CYMBALTA) 60 MG capsule Take 60 mg by mouth daily.   Yes [provider]  glucose blood (ONE TOUCH ULTRA TEST) test strip Use up to 4 times/day 06/01/15  Yes Kathrine Haddock, NP  goserelin (ZOLADEX) 3.6 MG injection Inject 3.6 mg into the skin every 28 (twenty-eight) days.   Yes [provider]  hydrOXYzine (VISTARIL) 50 MG capsule Take 50 mg by mouth 2 (two) times daily as needed.  06/19/19  Yes [provider]  lidocaine-prilocaine (EMLA) cream Apply to affected area once 09/21/17  Yes Karen Kitchens, NP  oxyCODONE (OXY IR/ROXICODONE)  5 MG immediate release tablet Take 1 tablet (5 mg total) by mouth 2 (two) times daily as needed for severe pain. Must last 30 days 10/09/19 11/08/19 Yes Milinda Pointer, MD  pregabalin (LYRICA) 150 MG capsule Take 1 capsule (150 mg total) by mouth 2 (two) times daily. 07/29/19 10/27/19 Yes Milinda Pointer, MD  Semaglutide,0.25 or 0.5MG /DOS, (OZEMPIC, 0.25 OR 0.5 MG/DOSE,) 2 MG/1.5ML SOPN Inject 0.25 MG into the skin weekly x 4 weeks and then increase to injecting 0.5 MG into the skin weekly. 07/03/19  Yes  Cannady, Jolene T, NP  sertraline (ZOLOFT) 50 MG tablet Take 50 mg by mouth every morning. 08/19/19  Yes [provider]  temazepam (RESTORIL) 15 MG capsule Take 15 mg by mouth daily as needed. 09/03/19  Yes [provider]    Allergies: Allergies  Allergen Reactions  . Aspirin Nausea And Vomiting    Review of Systems: Review of Systems  Constitutional: Negative for chills and fever.  Respiratory: Negative for shortness of breath.   Cardiovascular: Negative for chest pain.  Gastrointestinal: Negative for abdominal pain, nausea and vomiting.  Skin:       Localized area of tenderness in right lateral chest wall    Physical Exam BP 115/77   Pulse 91   Temp 98.5 F (36.9 C)   Resp 12   Ht 5\' 4"  (1.626 m)   Wt 173 lb 6.4 oz (78.7 kg)   LMP  (LMP Unknown) Comment: LAST PERIOD IN MAY 2019 WHEN SHE STARTED CHEMO  SpO2 97%   BMI 29.76 kg/m  CONSTITUTIONAL: No acute distress HEENT:  Normocephalic, atraumatic, extraocular motion intact. RESPIRATORY:  Lungs are clear, and breath sounds are equal bilaterally. Normal respiratory effort without pathologic use of accessory muscles. CARDIOVASCULAR: Heart is regular without murmurs, gallops, or rubs. BREAST:  Left breast s/p lumpectomy in lower outer quadrant, with incision well healed.  No palpable masses, skin changes other than radiation changes, nipple drainage or retraction.  Left axillary incision well healed, without any palpable lymphadenopathy.  Right breast without any palpable masses, skin changes, or nipple drainage.  No right axillary lymphadenopathy. SKIN:  Patient has a very localized area on the skin with tenderness in the right lateral chest wall, about the level of the nipple.  It only extends about 1 cm in diameter, but there's no associated mass.  There is a thin line of erythema measuring 1 cm, as if the skin had been pinched.  Patient also has a right chest port-a-cath in place with scar well  healed. NEUROLOGIC:  Motor and sensation is grossly normal.  Cranial nerves are grossly intact. PSYCH:  Alert and oriented to person, place and time. Affect is normal.  Labs/Imaging: Mammogram 09/09/19: FINDINGS: The left lumpectomy site is stable. No suspicious masses, calcifications, or distortion are seen in either breast.  Mammographic images were processed with CAD.  IMPRESSION: No mammographic evidence of malignancy.  RECOMMENDATION: Annual diagnostic mammography.  Assessment and Plan: This is a 54 y.o. female s/p left breast lumpectomy and SLNBx in 01/12/18  --Mammogram and left breast exam are reassuring without any suspicious findings. --The area of localized tenderness in the right lateral chest wall is not part of the breast tissue nor axilla.  There are no associated masses.  This could potentially be neuropathy, but unclear the etiology.  Recommended use of ice packs to help with the pain, and potentially ask Dr. Rogue Bussing if this could be related to neuropathy from chemo. --Follow up with me in  one year with diagnostic mammogram, or sooner if any issues or concerns.  Face-to-face time spent with the patient and care providers was 15 minutes, with more than 50% of the time spent counseling, educating, and coordinating care of the patient.     Melvyn Neth, [facility]

## 2019-09-18 NOTE — Patient Instructions (Signed)
Our office will contact you around July 2022 to schedule your mammogram and office visit for August 2022.   Please call the office if you have any questions or concerns.   Breast Self-Awareness Breast self-awareness means being familiar with how your breasts look and feel. It involves checking your breasts regularly and reporting any changes to your health care provider. Practicing breast self-awareness is important. Sometimes changes may not be harmful (are benign), but sometimes a change in your breasts can be a sign of a serious medical problem. It is important to learn how to do this procedure correctly so that you can catch problems early, when treatment is more likely to be successful. All women should practice breast self-awareness, including women who have had breast implants. What you need:  A mirror.  A well-lit room. How to do a breast self-exam A breast self-exam is one way to learn what is normal for your breasts and whether your breasts are changing. To do a breast self-exam: Look for changes  1. Remove all the clothing above your waist. 2. Stand in front of a mirror in a room with good lighting. 3. Put your hands on your hips. 4. Push your hands firmly downward. 5. Compare your breasts in the mirror. Look for differences between them (asymmetry), such as: ? Differences in shape. ? Differences in size. ? Puckers, dips, and bumps in one breast and not the other. 6. Look at each breast for changes in the skin, such as: ? Redness. ? Scaly areas. 7. Look for changes in your nipples, such as: ? Discharge. ? Bleeding. ? Dimpling. ? Redness. ? A change in position. Feel for changes Carefully feel your breasts for lumps and changes. It is best to do this while lying on your back on the floor, and again while sitting or standing in the tub or shower with soapy water on your skin. Feel each breast in the following way: 1. Place the arm on the side of the breast you are  examining above your head. 2. Feel your breast with the other hand. 3. Start in the nipple area and make -inch (2 cm) overlapping circles to feel your breast. Use the pads of your three middle fingers to do this. Apply light pressure, then medium pressure, then firm pressure. The light pressure will allow you to feel the tissue closest to the skin. The medium pressure will allow you to feel the tissue that is a little deeper. The firm pressure will allow you to feel the tissue close to the ribs. 4. Continue the overlapping circles, moving downward over the breast until you feel your ribs below your breast. 5. Move one finger-width toward the center of the body. Continue to use the -inch (2 cm) overlapping circles to feel your breast as you move slowly up toward your collarbone. 6. Continue the up-and-down exam using all three pressures until you reach your armpit.  Write down what you find Writing down what you find can help you remember what to discuss with your health care provider. Write down:  What is normal for each breast.  Any changes that you find in each breast, including: ? The kind of changes you find. ? Any pain or tenderness. ? Size and location of any lumps.  Where you are in your menstrual cycle, if you are still menstruating. General tips and recommendations  Examine your breasts every month.  If you are breastfeeding, the best time to examine your breasts is after a feeding  or after using a breast pump.  If you menstruate, the best time to examine your breasts is 5-7 days after your period. Breasts are generally lumpier during menstrual periods, and it may be more difficult to notice changes.  With time and practice, you will become more familiar with the variations in your breasts and more comfortable with the exam. Contact a health care provider if you:  See a change in the shape or size of your breasts or nipples.  See a change in the skin of your breast or  nipples, such as a reddened or scaly area.  Have unusual discharge from your nipples.  Find a lump or thick area that was not there before.  Have pain in your breasts.  Have any concerns related to your breast health. Summary  Breast self-awareness includes looking for physical changes in your breasts, as well as feeling for any changes within your breasts.  Breast self-awareness should be performed in front of a mirror in a well-lit room.  You should examine your breasts every month. If you menstruate, the best time to examine your breasts is 5-7 days after your menstrual period.  Let your health care provider know of any changes you notice in your breasts, including changes in size, changes on the skin, pain or tenderness, or unusual fluid from your nipples. This information is not intended to replace advice given to you by your health care provider. Make sure you discuss any questions you have with your health care provider. Document Revised: 09/12/2017 Document Reviewed: 09/12/2017 Elsevier Patient Education  [place].

## 2019-09-18 NOTE — Progress Notes (Signed)
Nursing Pain Medication Assessment:  Safety precautions to be maintained throughout the outpatient stay will include: orient to surroundings, keep bed in low position, maintain call bell within reach at all times, provide assistance with transfer out of bed and ambulation.  Medication Inspection Compliance: Pill count conducted under aseptic conditions, in front of the patient. Neither the pills nor the bottle was removed from the patient's sight at any time. Once count was completed pills were immediately returned to the patient in their original bottle.  Medication: Oxycodone ER (OxyContin) Pill/Patch Count: 41 of 60 pills remain Pill/Patch Appearance: Markings consistent with prescribed medication Bottle Appearance: Standard pharmacy container. Clearly labeled. Filled Date: 8 / 2 / 2021 Last Medication intake:  Today

## 2019-09-18 NOTE — Progress Notes (Signed)
PROVIDER NOTE: Information contained herein reflects review and annotations entered in association with encounter. Interpretation of such information and data should be left to medically-trained personnel. Information provided to patient can be located elsewhere in the medical record under "Patient Instructions". Document created using STT-dictation technology, any transcriptional errors that may result from process are unintentional.    Patient: Carolyn Roy  Service Category: E/M  Provider: Gaspar Cola, MD  DOB: 29-Apr-1965  DOS: 09/18/2019  Specialty: Interventional Pain Management  MRN: [id number]  Setting: Ambulatory outpatient  PCP: Guadalupe Maple, MD  Type: Established Patient    Referring Provider: Guadalupe Maple, MD  Location: Office  Delivery: Face-to-face     HPI  Reason for encounter: [name], a 54 y.o. year old female, is here today for evaluation and management of her Chronic pain syndrome [G89.4]. [name] primary complain today is Back Pain (lower) and Knee Pain (right) Last encounter: Practice (06/26/2019). My last encounter with her was on 06/25/2019. Pertinent problems: [name] has Chronic fatigue; Knee pain, left; Cancer of midline of breast, left ([place]); Family history of breast cancer; Chemotherapy-induced neuropathy ([place]); Malignant neoplasm of lower-outer quadrant of left breast of female, estrogen receptor positive ([place]); Burning sensation of feet (Bilateral); Numbness and tingling in hands (Bilateral); Chronic feet pain (Primary Area of Pain) (Bilateral) (R>L); Neuropathic pain of feet (Bilateral); Chronic knee pain (Secondary Area of Pain) (Bilateral) (R>L); Chronic hand pain (Tertiary Area of Pain) (Bilateral) (R>L); Chronic pain syndrome; Chronic hip pain (Fourth Area of Pain) (Bilateral) (R>L); Chronic upper extremity pain (Tertiary Area of Pain) (Bilateral) (R>L); Cancer-related pain; Neuropathic pain; Osteoarthritis of knee (Right); Chronic low  back pain (Bilateral (L>R) w/o sciatica; Chronic hip pain (Left); Lumbar facet syndrome (Bilateral) (L>R); Idiopathic scoliosis; Chronic sacroiliac joint pain (Left); Chronic pain of knee (Right); Abnormal MRI, lumbar spine (04/20/2018); Lumbar facet arthropathy; Spondylosis without myelopathy or radiculopathy, lumbosacral region; DDD (degenerative disc disease), lumbosacral; Numbness and tingling of upper extremity (C6/C7 dermatomes) (Right); Personal history of breast cancer; Cervicalgia; and Cervical radiculitis (Right) on their pertinent problem list. Pain Assessment: Severity of Chronic pain is reported as a 1 /10. Location: Back Lower/denies. Onset: More than a month ago. Quality: Aching, Burning, Stabbing (intense). Timing: Intermittent. Modifying factor(s): rest, medication. Vitals:  height is 5' 4" (1.626 m) and weight is 177 lb (80.3 kg). Her temperature is 97.1 F (36.2 C) (abnormal). Her blood pressure is 113/86 and her pulse is 93. Her respiration is 18 and oxygen saturation is 96%.   Today the patient came into the clinics for medication refill.  For unknown reason one of my nurses discharged her before I could see her.  We had to call her and have her come back. The patient indicates doing well with the current medication regimen. No adverse reactions or side effects reported to the medications.  Today we will update her UDS.  She did get her last prescription filled on 09/09/2019 and therefore she should be good until 11/08/2019.  I will have her come back on 11/06/2019.  At that time, I will review her UDS and if everything is okay, I will then provide her with enough refills to last for 90 days.  Her last Hyalgan injection was on 06/25/2019.  Pharmacotherapy Assessment   Analgesic: Oxycodone IR 5 mg, 1 tab PO BID (10 mg/day of oxycodone) (Average: 2 tabs/day = 10 mg/day of oxycodone) MME/day: 15 mg/day.   Monitoring: [place] PMP: PDMP reviewed during this encounter.  Pharmacotherapy: No  side-effects or adverse reactions reported. Compliance: No problems identified. Effectiveness: Clinically acceptable.  Ignatius Specking, RN  09/18/2019  9:04 AM  Sign when Signing Visit Nursing Pain Medication Assessment:  Safety precautions to be maintained throughout the outpatient stay will include: orient to surroundings, keep bed in low position, maintain call bell within reach at all times, provide assistance with transfer out of bed and ambulation.  Medication Inspection Compliance: Pill count conducted under aseptic conditions, in front of the patient. Neither the pills nor the bottle was removed from the patient's sight at any time. Once count was completed pills were immediately returned to the patient in their original bottle.  Medication: Oxycodone ER (OxyContin) Pill/Patch Count: 41 of 60 pills remain Pill/Patch Appearance: Markings consistent with prescribed medication Bottle Appearance: Standard pharmacy container. Clearly labeled. Filled Date: 8 / 2 / 2021 Last Medication intake:  Today    UDS:  Summary  Date Value Ref Range Status  01/22/2018 FINAL  Final    Comment:    ==================================================================== [facility] COMP DRUG ANALYSIS,UR ==================================================================== Test                             Result       Flag       Units Drug Present and Declared for Prescription Verification   Norhydrocodone                 255          EXPECTED   ng/mg creat    Norhydrocodone is an expected metabolite of hydrocodone.   Tramadol                       5912         EXPECTED   ng/mg creat   O-Desmethyltramadol            7009         EXPECTED   ng/mg creat   N-Desmethyltramadol            1209         EXPECTED   ng/mg creat    Source of tramadol is a prescription medication.    O-desmethyltramadol and N-desmethyltramadol are expected    metabolites of tramadol.   Gabapentin                     PRESENT       EXPECTED   Acetaminophen                  PRESENT      EXPECTED Drug Present not Declared for Prescription Verification   Alcohol, Ethyl                 0.242        UNEXPECTED g/dL    Sources of ethyl alcohol include alcoholic beverages or as a    fermentation product of glucose; glucose is present in this    specimen.  Interpret result with caution, as the presence of    ethyl alcohol is likely due, at least in part, to fermentation of    glucose.   Naproxen                       PRESENT      UNEXPECTED Drug Absent but Declared for Prescription Verification   Hydrocodone  Not Detected UNEXPECTED ng/mg creat    Hydrocodone is almost always present in patients taking this drug    consistently. Absence of hydrocodone could be due to lapse of    time since the last dose or unusual pharmacokinetics (rapid    metabolism).   Prochlorperazine               Not Detected UNEXPECTED   Salicylate                     Not Detected UNEXPECTED    Aspirin, as indicated in the declared medication list, is not    always detected even when used as directed.   Lidocaine                      Not Detected UNEXPECTED    Lidocaine, as indicated in the declared medication list, is not    always detected even when used as directed. ==================================================================== Test                      Result    Flag   Units      Ref Range   Creatinine              33               mg/dL      >=20 ==================================================================== Declared Medications:  The flagging and interpretation on this report are based on the  following declared medications.  Unexpected results may arise from  inaccuracies in the declared medications.  **Note: The testing scope of this panel includes these medications:  Gabapentin (Neurontin)  Hydrocodone (Norco)  Prochlorperazine (Compazine)  Tramadol (Ultram)  **Note: The testing scope of this panel does  not include small to  moderate amounts of these reported medications:  Acetaminophen (Excedrin)  Acetaminophen (Norco)  Aspirin (Excedrin)  Lidocaine (Lidocaine-Prilocaine)  **Note: The testing scope of this panel does not include following  reported medications:  Albuterol  Caffeine (Excedrin)  Canagliflozin (Invokana)  Insulin  Ondansetron (Zofran)  Prilocaine (Lidocaine-Prilocaine)  Vitamin D3 ==================================================================== For clinical consultation, please call [phone]. ====================================================================      ROS  Constitutional: Denies any fever or chills Gastrointestinal: No reported hemesis, hematochezia, vomiting, or acute GI distress Musculoskeletal: Denies any acute onset joint swelling, redness, loss of ROM, or weakness Neurological: No reported episodes of acute onset apraxia, aphasia, dysarthria, agnosia, amnesia, paralysis, loss of coordination, or loss of consciousness  Medication Review  DULoxetine, Semaglutide(0.25 or 0.5MG/DOS), albuterol, anastrozole, aspirin-acetaminophen-caffeine, atorvastatin, canagliflozin, glucose blood, goserelin, hydrOXYzine, lidocaine-prilocaine, ondansetron, oxyCODONE, pregabalin, prochlorperazine, sertraline, and temazepam  History Review  Allergy: [name] is allergic to aspirin. Drug: [name]  reports no history of drug use. Alcohol:  reports no history of alcohol use. Tobacco:  reports that she has been smoking cigarettes. She has a 11.00 pack-year smoking history. She has quit using smokeless tobacco.  Her smokeless tobacco use included snuff. Social: [name]  reports that she has been smoking cigarettes. She has a 11.00 pack-year smoking history. She has quit using smokeless tobacco.  Her smokeless tobacco use included snuff. She reports that she does not drink alcohol and does not use drugs. Medical:  has a past medical history of Allergy, Breast  cancer ([place]), Cancer ([place]) (06/15/2017), Depression, Diabetes mellitus without complication ([place]) (1751), Endometriosis, Family history of breast cancer, Headache, Hyperlipidemia, Ovarian mass, Personal history of chemotherapy, Personal history of radiation therapy, and Pneumonia. Surgical:  [name]  has a past surgical history that includes Oophorectomy; Tubal ligation; Portacath placement (Right, 07/14/2017); Axillary lymph node biopsy (Left, 07/14/2017); Breast lumpectomy (Left, 01/12/2018); Breast biopsy (Left); Partial mastectomy with needle localization (Left, 01/12/2018); and Sentinel node biopsy (Left, 01/12/2018). Family: family history includes Breast cancer in an other family member; Breast cancer (age of onset: 67) in her maternal grandmother; Breast cancer (age of onset: 6) in an other family member; Breast cancer (age of onset: 89) in her maternal aunt; Cancer in her mother; Colon cancer in her mother; Diabetes in her brother; Other in her father; Pancreatitis in her brother; Prostate cancer (age of onset: 27) in her brother.  Laboratory Chemistry Profile   Renal Lab Results  Component Value Date   BUN 10 07/19/2019   CREATININE 0.80 07/19/2019   BCR 13 06/03/2019   GFRAA >60 07/19/2019   GFRNONAA >60 07/19/2019     Hepatic Lab Results  Component Value Date   AST 16 07/19/2019   ALT 17 07/19/2019   ALBUMIN 3.8 07/19/2019   ALKPHOS 111 07/19/2019     Electrolytes Lab Results  Component Value Date   NA 138 07/19/2019   K 4.1 07/19/2019   CL 102 07/19/2019   CALCIUM 9.1 07/19/2019   MG 1.9 11/23/2017     Bone Lab Results  Component Value Date   VD25OH 20.0 (L) 12/13/2017     Inflammation (CRP: Acute Phase) (ESR: Chronic Phase) Lab Results  Component Value Date   CRP 7 01/22/2018   ESRSEDRATE 27 01/22/2018       Note: Above Lab results reviewed.  Recent Imaging Review  MM DIAG BREAST TOMO BILATERAL CLINICAL DATA:  Left lumpectomy.  Annual  mammography.  EXAM: DIGITAL DIAGNOSTIC BILATERAL MAMMOGRAM WITH TOMO AND CAD  COMPARISON:  Previous exam(s).  ACR Breast Density Category b: There are scattered areas of fibroglandular density.  FINDINGS: The left lumpectomy site is stable. No suspicious masses, calcifications, or distortion are seen in either breast.  Mammographic images were processed with CAD.  IMPRESSION: No mammographic evidence of malignancy.  RECOMMENDATION: Annual diagnostic mammography.  I have discussed the findings and recommendations with the patient. If applicable, a reminder letter will be sent to the patient regarding the next appointment.  BI-RADS CATEGORY  2: Benign.  Electronically Signed   By: Dorise Bullion III M.D   On: 09/09/2019 13:54 Note: Reviewed        Physical Exam  General appearance: Well nourished, well developed, and well hydrated. In no apparent acute distress Mental status: Alert, oriented x 3 (person, place, & time)       Respiratory: No evidence of acute respiratory distress Eyes: PERLA Vitals: BP 113/86   Pulse 93   Temp (!) 97.1 F (36.2 C)   Resp 18   Ht 5' 4" (1.626 m)   Wt 177 lb (80.3 kg)   LMP  (LMP Unknown) Comment: LAST PERIOD IN MAY 2019 WHEN SHE STARTED CHEMO  SpO2 96%   BMI 30.38 kg/m  BMI: Estimated body mass index is 30.38 kg/m as calculated from the following:   Height as of this encounter: 5' 4" (1.626 m).   Weight as of this encounter: 177 lb (80.3 kg). Ideal: Ideal body weight: 54.7 kg (120 lb 9.5 oz) Adjusted ideal body weight: 64.9 kg (143 lb 2.5 oz)  Assessment   Status Diagnosis  Controlled Controlled Controlled 1. Chronic pain syndrome   2. Chronic feet pain (Primary Area of Pain) (Bilateral) (R>L)  3. Chronic knee pain (Secondary Area of Pain) (Bilateral) (R>L)   4. Chronic hand pain (Tertiary Area of Pain) (Bilateral) (R>L)   5. Chronic hip pain (Fourth Area of Pain) (Bilateral) (R>L)   6. Pharmacologic therapy   7.  Uncomplicated opioid dependence ([place])      Updated Problems: Problem  Uncomplicated Opioid Dependence ([facility])    Plan of Care  Problem-specific:  No problem-specific Assessment & Plan notes found for this encounter.  Ms. ASHAUNTI TREPTOW has a current medication list which includes the following long-term medication(s): albuterol, atorvastatin, [START ON 10/09/2019] oxycodone, pregabalin, prochlorperazine, sertraline, and temazepam.  Pharmacotherapy (Medications Ordered): Meds ordered this encounter  Medications  . oxyCODONE (OXY IR/ROXICODONE) 5 MG immediate release tablet    Sig: Take 1 tablet (5 mg total) by mouth 2 (two) times daily as needed for severe pain. Must last 30 days    Dispense:  60 tablet    Refill:  0    Chronic Pain: STOP Act (Not applicable) Fill 1 day early if closed on refill date. Do not fill until: 10/09/2019. To last until: 11/08/2019. Avoid benzodiazepines within 8 hours of opioids   Orders:  Orders Placed This Encounter  Procedures  . ToxASSURE Select 13 (MW), Urine    Volume: 30 ml(s). Minimum 3 ml of urine is needed. Document temperature of fresh sample. Indications: Long term (current) use of opiate analgesic (Q59.563)    Order Specific Question:   Release to patient    Answer:   Immediate   Follow-up plan:   Return in 7 weeks (on 11/06/2019) for (20-min), (F2F), (Med Mgmt), on E/M day.      Considering:   Possible bilateral lumbar facet RFA  Diagnostic lumbar sympathetic block  Diagnostic right genicular NB  Possible right genicular nerve RFA    Palliative PRN treatment(s):   Therapeutic right IA knee joint injection #3 (steroids)  Diagnostic bilateral lumbar facet block #2  Therapeutic/palliative right knee Hyalgan inj. S2/N1       Recent Visits Date Type Provider Dept  07/16/19 Telemedicine Milinda Pointer, MD [facility]  06/25/19 Procedure visit Milinda Pointer, MD [facility]  Showing recent visits within past 90  days and meeting all other requirements Today's Visits Date Type Provider Dept  09/18/19 Office Visit Milinda Pointer, MD [facility]  Showing today's visits and meeting all other requirements Future Appointments No visits were found meeting these conditions. Showing future appointments within next 90 days and meeting all other requirements  I discussed the assessment and treatment plan with the patient. The patient was provided an opportunity to ask questions and all were answered. The patient agreed with the plan and demonstrated an understanding of the instructions.  Patient advised to call back or seek an in-person evaluation if the symptoms or condition worsens.  Duration of encounter: 25 minutes.  Note by: Gaspar Cola, MD Date: 09/18/2019; Time: 10:17 AM

## 2019-09-18 NOTE — Patient Instructions (Signed)
____________________________________________________________________________________________  Drug Holidays (Slow)  What is a "Drug Holiday"? Drug Holiday: is the name given to the period of time during which a patient stops taking a medication(s) for the purpose of eliminating tolerance to the drug.  Benefits . Improved effectiveness of opioids. . Decreased opioid dose needed to achieve benefits. . Improved pain with lesser dose.  What is tolerance? Tolerance: is the progressive decreased in effectiveness of a drug due to its repetitive use. With repetitive use, the body gets use to the medication and as a consequence, it loses its effectiveness. This is a common problem seen with opioid pain medications. As a result, a larger dose of the drug is needed to achieve the same effect that used to be obtained with a smaller dose.  How long should a "Drug Holiday" last? You should stay off of the pain medicine for at least 14 consecutive days. (2 weeks)  Should I stop the medicine "cold turkey"? No. You should always coordinate with your Pain Specialist so that he/she can provide you with the correct medication dose to make the transition as smoothly as possible.  How do I stop the medicine? Slowly. You will be instructed to decrease the daily amount of pills that you take by one (1) pill every seven (7) days. This is called a "slow downward taper" of your dose. For example: if you normally take four (4) pills per day, you will be asked to drop this dose to three (3) pills per day for seven (7) days, then to two (2) pills per day for seven (7) days, then to one (1) per day for seven (7) days, and at the end of those last seven (7) days, this is when the "Drug Holiday" would start.   Will I have withdrawals? By doing a "slow downward taper" like this one, it is unlikely that you will experience any significant withdrawal symptoms. Typically, what triggers withdrawals is the sudden stop of a high  dose opioid therapy. Withdrawals can usually be avoided by slowly decreasing the dose over a prolonged period of time. If you do not follow these instructions and decide to stop your medication abruptly, withdrawals may be possible.  What are withdrawals? Withdrawals: refers to the wide range of symptoms that occur after stopping or dramatically reducing opiate drugs after heavy and prolonged use. Withdrawal symptoms do not occur to patients that use low dose opioids, or those who take the medication sporadically. Contrary to benzodiazepine (example: Valium, Xanax, etc.) or alcohol withdrawals ("Delirium Tremens"), opioid withdrawals are not lethal. Withdrawals are the physical manifestation of the body getting rid of the excess receptors.  Expected Symptoms Early symptoms of withdrawal may include: . Agitation . Anxiety . Muscle aches . Increased tearing . Insomnia . Runny nose . Sweating . Yawning  Late symptoms of withdrawal may include: . Abdominal cramping . Diarrhea . Dilated pupils . Goose bumps . Nausea . Vomiting  Will I experience withdrawals? Due to the slow nature of the taper, it is very unlikely that you will experience any.  What is a slow taper? Taper: refers to the gradual decrease in dose.  (Last update: 08/28/2019) ____________________________________________________________________________________________    ____________________________________________________________________________________________  Medication Rules  Purpose: To inform patients, and their family members, of our rules and regulations.  Applies to: All patients receiving prescriptions (written or electronic).  Pharmacy of record: Pharmacy where electronic prescriptions will be sent. If written prescriptions are taken to a different pharmacy, please inform the nursing staff. The pharmacy   listed in the electronic medical record should be the one where you would like electronic prescriptions  to be sent.  Electronic prescriptions: In compliance with the [place] [employer] Opioid Misuse Prevention (STOP) Act of 2017 (Session Law 2017-74/H243), effective February 07, 2018, all controlled substances must be electronically prescribed. Calling prescriptions to the pharmacy will cease to exist.  Prescription refills: Only during scheduled appointments. Applies to all prescriptions.  NOTE: The following applies primarily to controlled substances (Opioid* Pain Medications).   Type of encounter (visit): For patients receiving controlled substances, face-to-face visits are required. (Not an option or up to the patient.)  Patient's responsibilities: 1. Pain Pills: Bring all pain pills to every appointment (except for procedure appointments). 2. Pill Bottles: Bring pills in original pharmacy bottle. Always bring the newest bottle. Bring bottle, even if empty. 3. Medication refills: You are responsible for knowing and keeping track of what medications you take and those you need refilled. The day before your appointment: write a list of all prescriptions that need to be refilled. The day of the appointment: give the list to the admitting nurse. Prescriptions will be written only during appointments. No prescriptions will be written on procedure days. If you forget a medication: it will not be "Called in", "Faxed", or "electronically sent". You will need to get another appointment to get these prescribed. No early refills. Do not call asking to have your prescription filled early. 4. Prescription Accuracy: You are responsible for carefully inspecting your prescriptions before leaving our office. Have the discharge nurse carefully go over each prescription with you, before taking them home. Make sure that your name is accurately spelled, that your address is correct. Check the name and dose of your medication to make sure it is accurate. Check the number of pills, and the written instructions to  make sure they are clear and accurate. Make sure that you are given enough medication to last until your next medication refill appointment. 5. Taking Medication: Take medication as prescribed. When it comes to controlled substances, taking less pills or less frequently than prescribed is permitted and encouraged. Never take more pills than instructed. Never take medication more frequently than prescribed.  6. Inform other Doctors: Always inform, all of your healthcare providers, of all the medications you take. 7. Pain Medication from other Providers: You are not allowed to accept any additional pain medication from any other Doctor or Healthcare provider. There are two exceptions to this rule. (see below) In the event that you require additional pain medication, you are responsible for notifying us, as stated below. 8. Medication Agreement: You are responsible for carefully reading and following our Medication Agreement. This must be signed before receiving any prescriptions from our practice. Safely store a copy of your signed Agreement. Violations to the Agreement will result in no further prescriptions. (Additional copies of our Medication Agreement are available upon request.) 9. Laws, Rules, & Regulations: All patients are expected to follow all Federal and State Laws, Statutes, Rules, & Regulations. Ignorance of the Laws does not constitute a valid excuse.  10. Illegal drugs and Controlled Substances: The use of illegal substances (including, but not limited to marijuana and its derivatives) and/or the illegal use of any controlled substances is strictly prohibited. Violation of this rule may result in the immediate and permanent discontinuation of any and all prescriptions being written by our practice. The use of any illegal substances is prohibited. 11. Adopted CDC guidelines & recommendations: Target dosing levels will be at or   below 60 MME/day. Use of benzodiazepines** is not  recommended.  Exceptions: There are only two exceptions to the rule of not receiving pain medications from other Healthcare Providers. 1. Exception #1 (Emergencies): In the event of an emergency (i.e.: accident requiring emergency care), you are allowed to receive additional pain medication. However, you are responsible for: As soon as you are able, call our office [phone], at any time of the day or night, and leave a message stating your name, the date and nature of the emergency, and the name and dose of the medication prescribed. In the event that your call is answered by a member of our staff, make sure to document and save the date, time, and the name of the person that took your information.  2. Exception #2 (Planned Surgery): In the event that you are scheduled by another doctor or dentist to have any type of surgery or procedure, you are allowed (for a period no longer than 30 days), to receive additional pain medication, for the acute post-op pain. However, in this case, you are responsible for picking up a copy of our "Post-op Pain Management for Surgeons" handout, and giving it to your surgeon or dentist. This document is available at our office, and does not require an appointment to obtain it. Simply go to our office during business hours (Monday-Thursday from 8:00 AM to 4:00 PM) (Friday 8:00 AM to 12:00 Noon) or if you have a scheduled appointment with us, prior to your surgery, and ask for it by name. In addition, you will need to provide us with your name, name of your surgeon, type of surgery, and date of procedure or surgery.  *Opioid medications include: morphine, codeine, oxycodone, oxymorphone, hydrocodone, hydromorphone, meperidine, tramadol, tapentadol, buprenorphine, fentanyl, methadone. **Benzodiazepine medications include: diazepam (Valium), alprazolam (Xanax), clonazepam (Klonopine), lorazepam (Ativan), clorazepate (Tranxene), chlordiazepoxide (Librium), estazolam (Prosom),  oxazepam (Serax), temazepam (Restoril), triazolam (Halcion) (Last updated: 04/06/2017) ____________________________________________________________________________________________   ____________________________________________________________________________________________  Medication Recommendations and Reminders  Applies to: All patients receiving prescriptions (written and/or electronic).  Medication Rules & Regulations: These rules and regulations exist for your safety and that of others. They are not flexible and neither are we. Dismissing or ignoring them will be considered "non-compliance" with medication therapy, resulting in complete and irreversible termination of such therapy. (See document titled "Medication Rules" for more details.) In all conscience, because of safety reasons, we cannot continue providing a therapy where the patient does not follow instructions.  Pharmacy of record:   Definition: This is the pharmacy where your electronic prescriptions will be sent.   We do not endorse any particular pharmacy, however, we have experienced problems with [facility] not securing enough medication supply for the community.  We do not restrict you in your choice of pharmacy. However, once we write for your prescriptions, we will NOT be re-sending more prescriptions to fix restricted supply problems created by your pharmacy, or your insurance.   The pharmacy listed in the electronic medical record should be the one where you want electronic prescriptions to be sent.  If you choose to change pharmacy, simply notify our nursing staff.  Recommendations:  Keep all of your pain medications in a safe place, under lock and key, even if you live alone. We will NOT replace lost, stolen, or damaged medication.  After you fill your prescription, take 1 week's worth of pills and put them away in a safe place. You should keep a separate, properly labeled bottle for this purpose. The remainder    should be kept in the original bottle. Use this as your primary supply, until it runs out. Once it's gone, then you know that you have 1 week's worth of medicine, and it is time to come in for a prescription refill. If you do this correctly, it is unlikely that you will ever run out of medicine.  To make sure that the above recommendation works, it is very important that you make sure your medication refill appointments are scheduled at least 1 week before you run out of medicine. To do this in an effective manner, make sure that you do not leave the office without scheduling your next medication management appointment. Always ask the nursing staff to show you in your prescription , when your medication will be running out. Then arrange for the receptionist to get you a return appointment, at least 7 days before you run out of medicine. Do not wait until you have 1 or 2 pills left, to come in. This is very poor planning and does not take into consideration that we may need to cancel appointments due to bad weather, sickness, or emergencies affecting our staff.  DO NOT ACCEPT A "Partial Fill": If for any reason your pharmacy does not have enough pills/tablets to completely fill or refill your prescription, do not allow for a "partial fill". The law allows the pharmacy to complete that prescription within 72 hours, without requiring a new prescription. If they do not fill the rest of your prescription within those 72 hours, you will need a separate prescription to fill the remaining amount, which we will NOT provide. If the reason for the partial fill is your insurance, you will need to talk to the pharmacist about payment alternatives for the remaining tablets, but again, DO NOT ACCEPT A PARTIAL FILL, unless you can trust your pharmacist to obtain the remainder of the pills within 72 hours.  Prescription refills and/or changes in medication(s):   Prescription refills, and/or changes in dose or medication,  will be conducted only during scheduled medication management appointments. (Applies to both, written and electronic prescriptions.)  No refills on procedure days. No medication will be changed or started on procedure days. No changes, adjustments, and/or refills will be conducted on a procedure day. Doing so will interfere with the diagnostic portion of the procedure.  No phone refills. No medications will be "called into the pharmacy".  No Fax refills.  No weekend refills.  No Holliday refills.  No after hours refills.  Remember:  Business hours are:  Monday to Thursday 8:00 AM to 4:00 PM Provider's Schedule: Jaela Yepez, MD - Appointments are:  Medication management: Monday and Wednesday 8:00 AM to 4:00 PM Procedure day: Tuesday and Thursday 7:30 AM to 4:00 PM Bilal Lateef, MD - Appointments are:  Medication management: Tuesday and Thursday 8:00 AM to 4:00 PM Procedure day: Monday and Wednesday 7:30 AM to 4:00 PM (Last update: 08/28/2019) ____________________________________________________________________________________________   ____________________________________________________________________________________________  CBD (cannabidiol) WARNING  Applicable to: All individuals currently taking or considering taking CBD (cannabidiol) and, more important, all patients taking opioid analgesic controlled substances (pain medication). (Example: oxycodone; oxymorphone; hydrocodone; hydromorphone; morphine; methadone; tramadol; tapentadol; fentanyl; buprenorphine; butorphanol; dextromethorphan; meperidine; codeine; etc.)  Legal status: CBD remains a Schedule I drug prohibited for any use. CBD is illegal with one exception. In the [place], CBD has a limited Food and Drug Administration (FDA) approval for the treatment of two specific types of epilepsy disorders. Only one CBD product has been approved by the   FDA for this purpose: "Epidiolex". FDA is aware that some  companies are marketing products containing cannabis and cannabis-derived compounds in ways that violate the Federal Food, Drug and Cosmetic Act (FD&C Act) and that may put the health and safety of consumers at risk. The FDA, a Federal agency, has not enforced the CBD status since 2018.   Legality: Some manufacturers ship CBD products nationally, which is illegal. Often such products are sold online and are therefore available throughout the country. CBD is openly sold in head shops and health food stores in some states where such sales have not been explicitly legalized. Selling unapproved products with unsubstantiated therapeutic claims is not only a violation of the law, but also can put patients at risk, as these products have not been proven to be safe or effective. Federal illegality makes it difficult to conduct research on CBD.  Reference: "FDA Regulation of Cannabis and Cannabis-Derived Products, Including Cannabidiol (CBD)" - https://www.fda.gov/news-events/public-health-focus/fda-regulation-cannabis-and-cannabis-derived-products-including-cannabidiol-cbd  Warning: CBD is not FDA approved and has not undergo the same manufacturing controls as prescription drugs.  This means that the purity and safety of available CBD may be questionable. Most of the time, despite manufacturer's claims, it is contaminated with THC (delta-9-tetrahydrocannabinol - the chemical in marijuana responsible for the "HIGH").  When this is the case, the THC contaminant will trigger a positive urine drug screen (UDS) test for Marijuana (carboxy-THC). Because a positive UDS for any illicit substance is a violation of our medication agreement, your opioid analgesics (pain medicine) may be permanently discontinued.  MORE ABOUT CBD  General Information: CBD  is a derivative of the Marijuana (cannabis sativa) plant discovered in 1940. It is one of the 113 identified substances found in Marijuana. It accounts for up to 40% of the  plant's extract. As of 2018, preliminary clinical studies on CBD included research for the treatment of anxiety, movement disorders, and pain. CBD is available and consumed in multiple forms, including inhalation of smoke or vapor, as an aerosol spray, and by mouth. It may be supplied as an oil containing CBD, capsules, dried cannabis, or as a liquid solution. CBD is thought not to be as psychoactive as THC (delta-9-tetrahydrocannabinol - the chemical in marijuana responsible for the "HIGH"). Studies suggest that CBD may interact with different biological target receptors in the body, including cannabinoid and other neurotransmitter receptors. As of 2018 the mechanism of action for its biological effects has not been determined.  Side-effects  Adverse reactions: Dry mouth, diarrhea, decreased appetite, fatigue, drowsiness, malaise, weakness, sleep disturbances, and others.  Drug interactions: CBC may interact with other medications such as blood-thinners. (Last update: 09/14/2019) ____________________________________________________________________________________________    

## 2019-09-20 LAB — TOXASSURE SELECT 13 (MW), URINE

## 2019-09-22 NOTE — Progress Notes (Signed)
This patient's chart is under "My Open Charts". These are cancelled appointments that keep popping into my "In Basket" as a deficiency. See what you can do to remove them.   Thank you. 

## 2019-09-25 ENCOUNTER — Telehealth: Payer: Self-pay

## 2019-10-16 ENCOUNTER — Encounter
Payer: No Typology Code available for payment source | Admitting: Student in an Organized Health Care Education/Training Program

## 2019-10-18 ENCOUNTER — Encounter: Payer: Self-pay | Admitting: Nurse Practitioner

## 2019-10-18 ENCOUNTER — Ambulatory Visit (INDEPENDENT_AMBULATORY_CARE_PROVIDER_SITE_OTHER): Payer: No Typology Code available for payment source | Admitting: Nurse Practitioner

## 2019-10-18 ENCOUNTER — Other Ambulatory Visit: Payer: Self-pay

## 2019-10-18 VITALS — BP 108/73 | HR 85 | Temp 98.3°F | Wt 172.0 lb

## 2019-10-18 DIAGNOSIS — E1165 Type 2 diabetes mellitus with hyperglycemia: Secondary | ICD-10-CM | POA: Diagnosis not present

## 2019-10-18 DIAGNOSIS — Z1159 Encounter for screening for other viral diseases: Secondary | ICD-10-CM

## 2019-10-18 DIAGNOSIS — C50512 Malignant neoplasm of lower-outer quadrant of left female breast: Secondary | ICD-10-CM | POA: Diagnosis not present

## 2019-10-18 DIAGNOSIS — E1169 Type 2 diabetes mellitus with other specified complication: Secondary | ICD-10-CM

## 2019-10-18 DIAGNOSIS — Z23 Encounter for immunization: Secondary | ICD-10-CM

## 2019-10-18 DIAGNOSIS — G620 Drug-induced polyneuropathy: Secondary | ICD-10-CM

## 2019-10-18 DIAGNOSIS — Z1211 Encounter for screening for malignant neoplasm of colon: Secondary | ICD-10-CM

## 2019-10-18 DIAGNOSIS — E785 Hyperlipidemia, unspecified: Secondary | ICD-10-CM

## 2019-10-18 DIAGNOSIS — Z170 Estrogen receptor positive status [ER+]: Secondary | ICD-10-CM

## 2019-10-18 DIAGNOSIS — E538 Deficiency of other specified B group vitamins: Secondary | ICD-10-CM

## 2019-10-18 DIAGNOSIS — Z794 Long term (current) use of insulin: Secondary | ICD-10-CM

## 2019-10-18 DIAGNOSIS — T451X5A Adverse effect of antineoplastic and immunosuppressive drugs, initial encounter: Secondary | ICD-10-CM

## 2019-10-18 LAB — BAYER DCA HB A1C WAIVED: HB A1C (BAYER DCA - WAIVED): 7.5 % — ABNORMAL HIGH (ref <7.0)

## 2019-10-18 MED ORDER — BASAGLAR KWIKPEN 100 UNIT/ML ~~LOC~~ SOPN: 15.0000 [IU] | PEN_INJECTOR | Freq: Every day | SUBCUTANEOUS | 4 refills | Status: DC

## 2019-10-18 MED ORDER — INSULIN PEN NEEDLE 31G X 8 MM MISC: 12 refills | Status: DC

## 2019-10-18 MED ORDER — TRULICITY 0.75 MG/0.5ML ~~LOC~~ SOAJ
0.7500 mg | SUBCUTANEOUS | 4 refills | Status: DC
Start: 2019-10-18 — End: 2020-10-27

## 2019-10-18 NOTE — Assessment & Plan Note (Signed)
Continue collaboration with oncology, recent notes reviewed. 

## 2019-10-18 NOTE — Progress Notes (Signed)
BP 108/73   Pulse 85   Temp 98.3 F (36.8 C) (Oral)   Wt 172 lb (78 kg)   LMP  (LMP Unknown) Comment: LAST PERIOD IN MAY 2019 WHEN SHE STARTED CHEMO  SpO2 96%   BMI 29.52 kg/m    Subjective:    Patient ID: Carolyn Roy, female    DOB: June 04, 1965, 54 y.o.   MRN: [id number]  HPI: Carolyn Roy is a 54 y.o. female  Chief Complaint  Patient presents with  . Diabetes  . Hypertension  . Hyperlipidemia   DIABETES Last A1C 9.3% in April.  Continues to be followed by oncology for malignant neoplasm of left breast.  Last seen by endocrinology in April 2020, as had family loss and was going through chemo was unable to attend further appointment.  Continues on Invokana 300 MG and Ozempic 0.5 MG weekly. Has tried Metformin, which she no longer wishes to take due to breast cancer, she had read online this could cause breast CA.  Takes Pregabalin for neuropathy pain, followed by oncology and last seen 09/18/2019.  She is also followed by pain clinic for chronic pain -- last visit 09/18/19.  She is a smoker, 1/2 to 1 PPD.  Quit for 6 years, but then started back.   Hypoglycemic episodes:no Polydipsia/polyuria: no Visual disturbance: no Chest pain: no Paresthesias: no Glucose Monitoring: yes  Accucheck frequency: BID  Fasting glucose: 75 to 90  Post prandial:  Evening:   Before meals: 160 range Taking Insulin?: yes  Long acting insulin: 15 units  Short acting insulin: Blood Pressure Monitoring: not checking Retinal Examination: Up To Date -- went in April -- [facility] Spark Foot Exam: Up to Date Pneumovax: Up To Date Influenza: Up To Date Aspirin: no   HYPERLIPIDEMIA Taking Atorvastatin 20 MG daily. Satisfied with current treatment? yes Duration of hyperlipidemia: chronic Cholesterol medication side effects: no Cholesterol supplements: none Aspirin: no Recent stressors: no Recurrent headaches: no Visual changes: no Palpitations: no Dyspnea: no Chest pain: no Lower  extremity edema: no Dizzy/lightheaded: no  The 10-year ASCVD risk score Mikey Bussing DC Jr., et al., 2013) is: 6.8%   Values used to calculate the score:     Age: 13 years     Sex: Female     Is Non-Hispanic African American: No     Diabetic: Yes     Tobacco smoker: Yes     Systolic Blood Pressure: 283 mmHg     Is BP treated: No     HDL Cholesterol: 45 mg/dL     Total Cholesterol: 198 mg/dL  Relevant past medical, surgical, family and social history reviewed and updated as indicated. Interim medical history since our last visit reviewed. Allergies and medications reviewed and updated.  Review of Systems  Constitutional: Negative for activity change, appetite change, diaphoresis, fatigue and fever.  Respiratory: Negative for cough, chest tightness and shortness of breath.   Cardiovascular: Negative for chest pain, palpitations and leg swelling.  Gastrointestinal: Negative.   Endocrine: Negative for cold intolerance, heat intolerance, polydipsia, polyphagia and polyuria.  Neurological: Negative.   Psychiatric/Behavioral: Negative.     Per HPI unless specifically indicated above     Objective:    BP 108/73   Pulse 85   Temp 98.3 F (36.8 C) (Oral)   Wt 172 lb (78 kg)   LMP  (LMP Unknown) Comment: LAST PERIOD IN MAY 2019 WHEN SHE STARTED CHEMO  SpO2 96%   BMI 29.52 kg/m   Wt Readings from Last  3 Encounters:  10/18/19 172 lb (78 kg)  09/18/19 173 lb 6.4 oz (78.7 kg)  09/18/19 177 lb (80.3 kg)    Physical Exam Vitals and nursing note reviewed.  Constitutional:      General: She is awake.     Appearance: She is well-developed and well-groomed.  HENT:     Head: Normocephalic.     Right Ear: Hearing normal.     Left Ear: Hearing normal.  Eyes:     General: Lids are normal.        Right eye: No discharge.        Left eye: No discharge.     Conjunctiva/sclera: Conjunctivae normal.     Pupils: Pupils are equal, round, and reactive to light.  Neck:     Thyroid: No  thyromegaly.     Vascular: No carotid bruit.  Cardiovascular:     Rate and Rhythm: Normal rate and regular rhythm.     Heart sounds: Normal heart sounds. No murmur heard.  No gallop.   Pulmonary:     Effort: Pulmonary effort is normal. No accessory muscle usage or respiratory distress.     Breath sounds: Normal breath sounds.  Abdominal:     General: Bowel sounds are normal.     Palpations: Abdomen is soft.  Musculoskeletal:     Cervical back: Normal range of motion and neck supple.     Right lower leg: No edema.     Left lower leg: No edema.  Skin:    General: Skin is warm and dry.  Neurological:     Mental Status: She is alert and oriented to person, place, and time.  Psychiatric:        Attention and Perception: Attention normal.        Mood and Affect: Mood normal.        Speech: Speech normal.        Behavior: Behavior normal. Behavior is cooperative.        Thought Content: Thought content normal.    Results for orders placed or performed in visit on 10/18/19  Bayer DCA Hb A1c Waived  Result Value Ref Range   HB A1C (BAYER DCA - WAIVED) 7.5 (H) <7.0 %      Assessment & Plan:   Problem List Items Addressed This Visit      Endocrine   Hyperlipidemia associated with type 2 diabetes mellitus (HCC)    Chronic, ongoing.  Continue Atorvastatin 20 MG daily.  Lipid panel today and adjust dose as needed.      Relevant Medications   Dulaglutide (TRULICITY) 9.24 QA/8.3MH SOPN   Insulin Glargine (BASAGLAR KWIKPEN) 100 UNIT/ML   Other Relevant Orders   Bayer DCA Hb A1c Waived (Completed)   Lipid Panel w/o Chol/HDL Ratio   Type 2 diabetes mellitus with hyperglycemia, with long-term current use of insulin (HCC) - Primary    Chronic, ongoing with A1C 7.5% today, trending down and closer to goal.  Previously followed by endo, but prefers not to return at this time. Continue Invokana and Basaglar at 15 units.  Change Ozempic to Trulicity due to insurance coverage, script sent and  will adjust as needed.  She would like to come off insulin in future, will see if possible with ongoing GLP use.  Recommend continue to monitor BS at home BID and heavily focus on diet changes.  Check TSH today.  Return in 3 months, sooner if medication changes made.      Relevant Medications  Dulaglutide (TRULICITY) 9.44 CQ/1.9UV SOPN   Insulin Glargine (BASAGLAR KWIKPEN) 100 UNIT/ML   Other Relevant Orders   Bayer DCA Hb A1c Waived (Completed)   Basic metabolic panel   TSH     Nervous and Auditory   Chemotherapy-induced neuropathy (HCC) (Chronic)    Chronic, ongoing.  Continue current pain management collaboration.  Check B12 level today.        Other   Malignant neoplasm of lower-outer quadrant of left breast of female, estrogen receptor positive ([place])    Continue collaboration with oncology, recent notes reviewed.       Other Visit Diagnoses    B12 deficiency       B12 check today, reports history of low levels.  Initiate supplement as needed.   Relevant Orders   Vitamin B12   Flu vaccine need       Flu vaccine today.   Relevant Orders   Flu Vaccine QUAD 36+ mos IM (Completed)   Colon cancer screening       GI referral today.   Relevant Orders   Ambulatory referral to Gastroenterology   Need for hepatitis C screening test       Hep C screening on labs    Relevant Orders   Hepatitis C antibody       Follow up plan: Return in about 3 months (around 01/17/2020) for T2DM, HLD, MOOD.

## 2019-10-18 NOTE — Assessment & Plan Note (Signed)
Chronic, ongoing.  Continue current pain management collaboration.  Check B12 level today.

## 2019-10-18 NOTE — Patient Instructions (Signed)

## 2019-10-18 NOTE — Assessment & Plan Note (Addendum)
Chronic, ongoing with A1C 7.5% today, trending down and closer to goal.  Previously followed by endo, but prefers not to return at this time. Continue Invokana and Basaglar at 15 units.  Change Ozempic to Trulicity due to insurance coverage, script sent and will adjust as needed.  She would like to come off insulin in future, will see if possible with ongoing GLP use.  Recommend continue to monitor BS at home BID and heavily focus on diet changes.  Check TSH today.  Return in 3 months, sooner if medication changes made.

## 2019-10-18 NOTE — Assessment & Plan Note (Signed)
Chronic, ongoing.  Continue Atorvastatin 20 MG daily.  Lipid panel today and adjust dose as needed.

## 2019-10-19 LAB — TSH: TSH: 2.09 u[IU]/mL (ref 0.450–4.500)

## 2019-10-19 LAB — LIPID PANEL W/O CHOL/HDL RATIO
Cholesterol, Total: 180 mg/dL (ref 100–199)
HDL: 44 mg/dL (ref >39)
LDL Chol Calc (NIH): 116 mg/dL — ABNORMAL HIGH (ref 0–99)
Triglycerides: 110 mg/dL (ref 0–149)
VLDL Cholesterol Cal: 20 mg/dL (ref 5–40)

## 2019-10-19 LAB — BASIC METABOLIC PANEL
BUN/Creatinine Ratio: 19 (ref 9–23)
BUN: 14 mg/dL (ref 6–24)
CO2: 23 mmol/L (ref 20–29)
Calcium: 9.5 mg/dL (ref 8.7–10.2)
Chloride: 101 mmol/L (ref 96–106)
Creatinine, Ser: 0.73 mg/dL (ref 0.57–1.00)
GFR calc Af Amer: 110 mL/min/{1.73_m2} (ref >59)
GFR calc non Af Amer: 95 mL/min/{1.73_m2} (ref >59)
Glucose: 138 mg/dL — ABNORMAL HIGH (ref 65–99)
Potassium: 4 mmol/L (ref 3.5–5.2)
Sodium: 138 mmol/L (ref 134–144)

## 2019-10-19 LAB — VITAMIN B12: Vitamin B-12: 424 pg/mL (ref 232–1245)

## 2019-10-19 LAB — HEPATITIS C ANTIBODY: Hep C Virus Ab: <0.1 s/co ratio (ref 0.0–0.9)

## 2019-10-20 NOTE — Progress Notes (Signed)
Contacted via [place] afternoon Carolyn Roy, your labs have returned.  Kidney function remains stable.  Hep C is negative.  TSH is normal.  LDL, bad cholesterol, continues above goal but is trending down.  Would be good to get a fasting lab in future.  Continue your statin daily.  B12 is on low side of normal, I do recommend taking a little Vitamin B12 daily if you are not taking this.  Taking 500 MCG daily, this is good for nervous system health and nerve pain.  Any questions? Keep being awesome!!  Thank you for allowing me to participate in your care. Kindest regards, Onna Nodal

## 2019-10-21 ENCOUNTER — Inpatient Hospital Stay (HOSPITAL_BASED_OUTPATIENT_CLINIC_OR_DEPARTMENT_OTHER): Payer: No Typology Code available for payment source | Admitting: Internal Medicine

## 2019-10-21 ENCOUNTER — Inpatient Hospital Stay: Payer: No Typology Code available for payment source | Attending: Internal Medicine

## 2019-10-21 ENCOUNTER — Encounter: Payer: Self-pay | Admitting: Internal Medicine

## 2019-10-21 ENCOUNTER — Inpatient Hospital Stay: Payer: No Typology Code available for payment source

## 2019-10-21 ENCOUNTER — Other Ambulatory Visit: Payer: Self-pay

## 2019-10-21 ENCOUNTER — Telehealth: Payer: Self-pay

## 2019-10-21 DIAGNOSIS — R5383 Other fatigue: Secondary | ICD-10-CM | POA: Diagnosis not present

## 2019-10-21 DIAGNOSIS — Z170 Estrogen receptor positive status [ER+]: Secondary | ICD-10-CM

## 2019-10-21 DIAGNOSIS — Z90721 Acquired absence of ovaries, unilateral: Secondary | ICD-10-CM | POA: Diagnosis not present

## 2019-10-21 DIAGNOSIS — Z803 Family history of malignant neoplasm of breast: Secondary | ICD-10-CM | POA: Diagnosis not present

## 2019-10-21 DIAGNOSIS — Z79899 Other long term (current) drug therapy: Secondary | ICD-10-CM | POA: Insufficient documentation

## 2019-10-21 DIAGNOSIS — Z8042 Family history of malignant neoplasm of prostate: Secondary | ICD-10-CM | POA: Insufficient documentation

## 2019-10-21 DIAGNOSIS — F17210 Nicotine dependence, cigarettes, uncomplicated: Secondary | ICD-10-CM | POA: Insufficient documentation

## 2019-10-21 DIAGNOSIS — Z800 Family history of malignant neoplasm of digestive organs: Secondary | ICD-10-CM | POA: Diagnosis not present

## 2019-10-21 DIAGNOSIS — F329 Major depressive disorder, single episode, unspecified: Secondary | ICD-10-CM | POA: Diagnosis not present

## 2019-10-21 DIAGNOSIS — Z8379 Family history of other diseases of the digestive system: Secondary | ICD-10-CM | POA: Insufficient documentation

## 2019-10-21 DIAGNOSIS — G8929 Other chronic pain: Secondary | ICD-10-CM | POA: Insufficient documentation

## 2019-10-21 DIAGNOSIS — Z5111 Encounter for antineoplastic chemotherapy: Secondary | ICD-10-CM | POA: Diagnosis not present

## 2019-10-21 DIAGNOSIS — G4700 Insomnia, unspecified: Secondary | ICD-10-CM | POA: Diagnosis not present

## 2019-10-21 DIAGNOSIS — M2550 Pain in unspecified joint: Secondary | ICD-10-CM | POA: Insufficient documentation

## 2019-10-21 DIAGNOSIS — Z833 Family history of diabetes mellitus: Secondary | ICD-10-CM | POA: Insufficient documentation

## 2019-10-21 DIAGNOSIS — Z886 Allergy status to analgesic agent status: Secondary | ICD-10-CM | POA: Diagnosis not present

## 2019-10-21 DIAGNOSIS — C50512 Malignant neoplasm of lower-outer quadrant of left female breast: Secondary | ICD-10-CM

## 2019-10-21 DIAGNOSIS — Z794 Long term (current) use of insulin: Secondary | ICD-10-CM | POA: Insufficient documentation

## 2019-10-21 DIAGNOSIS — E119 Type 2 diabetes mellitus without complications: Secondary | ICD-10-CM | POA: Insufficient documentation

## 2019-10-21 LAB — CBC WITH DIFFERENTIAL/PLATELET
Abs Immature Granulocytes: 0.02 10*3/uL (ref 0.00–0.07)
Basophils Absolute: 0 10*3/uL (ref 0.0–0.1)
Basophils Relative: 1 %
Eosinophils Absolute: 0.1 10*3/uL (ref 0.0–0.5)
Eosinophils Relative: 1 %
HCT: 42.6 % (ref 36.0–46.0)
Hemoglobin: 14.3 g/dL (ref 12.0–15.0)
Immature Granulocytes: 0 %
Lymphocytes Relative: 19 %
Lymphs Abs: 1.4 10*3/uL (ref 0.7–4.0)
MCH: 31.3 pg (ref 26.0–34.0)
MCHC: 33.6 g/dL (ref 30.0–36.0)
MCV: 93.2 fL (ref 80.0–100.0)
Monocytes Absolute: 0.5 10*3/uL (ref 0.1–1.0)
Monocytes Relative: 7 %
Neutro Abs: 5.5 10*3/uL (ref 1.7–7.7)
Neutrophils Relative %: 72 %
Platelets: 214 10*3/uL (ref 150–400)
RBC: 4.57 MIL/uL (ref 3.87–5.11)
RDW: 13 % (ref 11.5–15.5)
WBC: 7.5 10*3/uL (ref 4.0–10.5)
nRBC: 0 % (ref 0.0–0.2)

## 2019-10-21 LAB — COMPREHENSIVE METABOLIC PANEL WITH GFR
ALT: 11 U/L (ref 0–44)
AST: 14 U/L — ABNORMAL LOW (ref 15–41)
Albumin: 3.9 g/dL (ref 3.5–5.0)
Alkaline Phosphatase: 80 U/L (ref 38–126)
Anion gap: 9 (ref 5–15)
BUN: 13 mg/dL (ref 6–20)
CO2: 26 mmol/L (ref 22–32)
Calcium: 8.7 mg/dL — ABNORMAL LOW (ref 8.9–10.3)
Chloride: 103 mmol/L (ref 98–111)
Creatinine, Ser: 0.6 mg/dL (ref 0.44–1.00)
GFR calc Af Amer: >60 mL/min
GFR calc non Af Amer: >60 mL/min
Glucose, Bld: 162 mg/dL — ABNORMAL HIGH (ref 70–99)
Potassium: 3.6 mmol/L (ref 3.5–5.1)
Sodium: 138 mmol/L (ref 135–145)
Total Bilirubin: 0.6 mg/dL (ref 0.3–1.2)
Total Protein: 7.2 g/dL (ref 6.5–8.1)

## 2019-10-21 MED ORDER — SODIUM CHLORIDE 0.9% FLUSH
10.0000 mL | INTRAVENOUS | Status: DC | PRN
  Administered 2019-10-21: 10 mL via INTRAVENOUS
  Filled 2019-10-21: qty 10

## 2019-10-21 MED ORDER — HEPARIN SOD (PORK) LOCK FLUSH 100 UNIT/ML IV SOLN
500.0000 [IU] | Freq: Once | INTRAVENOUS | Status: AC
  Administered 2019-10-21: 500 [IU] via INTRAVENOUS
  Filled 2019-10-21: qty 5

## 2019-10-21 MED ORDER — HEPARIN SOD (PORK) LOCK FLUSH 100 UNIT/ML IV SOLN
INTRAVENOUS | Status: AC
  Filled 2019-10-21: qty 5

## 2019-10-21 MED ORDER — GOSERELIN ACETATE 10.8 MG ~~LOC~~ IMPL
10.8000 mg | DRUG_IMPLANT | Freq: Once | SUBCUTANEOUS | Status: AC
  Administered 2019-10-21: 10.8 mg via SUBCUTANEOUS
  Filled 2019-10-21: qty 10.8

## 2019-10-21 MED ORDER — ANASTROZOLE 1 MG PO TABS: 1.0000 mg | ORAL_TABLET | Freq: Every day | ORAL | 3 refills | Status: DC

## 2019-10-21 NOTE — Assessment & Plan Note (Addendum)
#   breast cancer Stage III ER/PR pos her 2 NEG. currently on anastrazole+ Zoldadex.  No clinical evidence of recurrence. STABLE.  Zoladex today.  #Continue current therapy; tolerating fairly well except for continued joint pains.  Zometa q 6 months; zometa at next visit.  Calcium normal   # Depression/ Insomnia- STABLE;  On cymblata/Zoloft-[ Dr.kapur]  # Joint pains-STABLE; sec to AI-  on vit D/continue Osteo Bi-Flex/Neurontin.  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 STABLE.   # DM-2 on insulin- STABLE [z1c-7.5]Blood glucose-162;   Defer to PCP  # DISPOSITION: # Zoladex  today.  # Follow up in 3 month;  MD;cbc/cmp/ca-27-29; Zoladex/ Zometa; port flush. Dr.B

## 2019-10-21 NOTE — Progress Notes (Signed)
[facility] CONSULT NOTE  Patient Care Team: Guadalupe Maple, MD as PCP - General (Family Medicine) Bary Castilla, [name], MD (General Surgery) [name], [name], [name] (Obstetrics and Gynecology) Cammie Sickle, MD as Consulting Physician (Oncology) Vanita Ingles, RN as Case Manager (General Practice)  CHIEF COMPLAINTS/PURPOSE OF CONSULTATION: Breast cancer  Oncology History Overview Note  # MAY 2019-  clinical stage IIIA (T3N1Mx) left breast cancer s/p biopsy on 06/14/2017. -Pathology revealed grade III invasive ductal carcinoma. -Axillary FNA revealed malignant cells c/w metastatic carcinoma. Tumor was ER + (90%), PR + (30%), Her2/neu - and Ki67 70%.  CA27.29 was 7.8 on 06/14/2017.  # Carolyn Roy received 4 cycles of AC with Neulasta support (07/20/2017 - 08/31/2017).;  neoadjuvant Taxol on 09/14/2017.  #DEC 2019- Lumpectomy/sentinel lymph node biopsy [Dr.[name]]-complete pathologic response  # s/p RT [delayed sec to wound infection; Dr.[name]] finished RT [4/12]  # April 14th 2020- START TAM; stopped in mid-May secondary intolerance [severe migraines].  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 Secord/Berchuck]-negative for malignancy/ # DM-2- poorly controlled.   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   # PAP SMEAR- RECOMMENDED 2022- summer  -------------------------------------------  DIAGNOSIS: left breast cancer  STAGE:  III       ;GOALS: cure  CURRENT/MOST RECENT THERAPY Tam    Cancer of midline of breast, left (HCC)  06/15/2017 Initial Diagnosis   Cancer of midline of breast, left ([place])   07/20/2017 - 11/16/2017 Chemotherapy   The patient had dexamethasone (DECADRON) 4 MG tablet, 1 of 1 cycle, Start date: --, End date:  -- DOXOrubicin (ADRIAMYCIN) chemo injection 122 mg, 60 mg/m2 = 122 mg, Intravenous,  Once, 4 of 4 cycles Administration: 122 mg (07/20/2017), 122 mg (08/03/2017), 122 mg (08/17/2017), 122 mg (08/31/2017) palonosetron (ALOXI) injection 0.25 mg, 0.25 mg, Intravenous,  Once, 4 of 4 cycles Administration: 0.25 mg (07/20/2017), 0.25 mg (08/03/2017), 0.25 mg (08/17/2017), 0.25 mg (08/31/2017) pegfilgrastim (NEULASTA) injection 6 mg, 6 mg, Subcutaneous, Once, 5 of 5 cycles Administration: 6 mg (07/21/2017), 6 mg (08/04/2017), 6 mg (08/18/2017), 6 mg (09/01/2017) cyclophosphamide (CYTOXAN) 1,220 mg in sodium chloride 0.9 % 250 mL chemo infusion, 600 mg/m2 = 1,220 mg, Intravenous,  Once, 4 of 4 cycles Administration: 1,220 mg (07/20/2017), 1,220 mg (08/03/2017), 1,220 mg (08/17/2017), 1,220 mg (08/31/2017) PACLitaxel (TAXOL) 162 mg in sodium chloride 0.9 % 250 mL chemo infusion (</= 21m/m2), 80 mg/m2 = 162 mg, Intravenous,  Once, 10 of 12 cycles Dose modification: 65 mg/m2 (original dose 80 mg/m2, Cycle 13, Reason: Provider Judgment, Comment: neuropathy) Administration: 162 mg (09/14/2017), 162 mg (09/21/2017), 162 mg (09/28/2017), 162 mg (10/05/2017), 162 mg (10/12/2017), 162 mg (10/19/2017), 162 mg (10/26/2017), 132 mg (11/02/2017), 132 mg (11/09/2017), 132 mg (11/16/2017) fosaprepitant (EMEND) 150 mg, dexamethasone (DECADRON) 12 mg in sodium chloride 0.9 % 145 mL IVPB, , Intravenous,  Once, 4 of 4 cycles Administration:  (07/20/2017),  (08/03/2017),  (08/17/2017),  (08/31/2017)  for chemotherapy treatment.    Malignant neoplasm of lower-outer quadrant of left breast of female, estrogen receptor positive [place]  11/08/2017 Initial Diagnosis   Malignant neoplasm of lower-outer quadrant of left breast of female, estrogen [place] positive [place]    HISTORY OF PRESENTING ILLNESS:   Carolyn CLINE89y.o.  female with a history of stage III ER  PR positive HER-2/neu negative breast cancer currently on Arimidex plus Zoladex is here for  follow-up.  Patient denies any new lumps or bumps.  No headaches.  No new bone pain.  Chronic joint pains.  No fevers or chills.  Chronic mild fatigue.  No shortness of the cough.  Review of Systems  Constitutional: Positive for malaise/fatigue. Negative for chills, diaphoresis, fever and weight loss.  HENT: Negative for nosebleeds and sore throat.   Eyes: Negative for double vision.  Respiratory: Negative for cough, hemoptysis, sputum production, shortness of breath and wheezing.   Cardiovascular: Negative for chest pain, palpitations, orthopnea and leg swelling.  Gastrointestinal: Negative for abdominal pain, blood in stool, constipation, diarrhea, heartburn, melena, nausea and vomiting.  Genitourinary: Negative for dysuria, frequency and urgency.  Musculoskeletal: Positive for joint pain. Negative for back pain.  Skin: Negative.  Negative for itching and rash.  Neurological: Positive for tingling. Negative for dizziness, focal weakness, weakness and headaches.  Endo/Heme/Allergies: Does not bruise/bleed easily.  Psychiatric/Behavioral: Negative for depression. The patient is not nervous/anxious and does not have insomnia.      MEDICAL HISTORY:  Past Medical History:  Diagnosis Date  . Allergy   . Breast cancer ([place])   . Cancer ([place]) 06/15/2017   5.1 cm, T3,N1 (clinical): ER/ PR positive, Her 2 neu not overexpressed, High Ki 67. Neuoadjuvant chemotherapy.   . Depression   . Diabetes mellitus without complication ([place]) 9518  . Endometriosis   . Family history of breast cancer   . Headache    migraines  . Hyperlipidemia   . Ovarian mass   . Personal history of chemotherapy   . Personal history of radiation therapy   . Pneumonia    2018    SURGICAL HISTORY: Past Surgical History:  Procedure Laterality Date  . AXILLARY LYMPH NODE BIOPSY Left 07/14/2017   Procedure: INSERTION GEL MARK CLIP LEFT AXILLA;  Surgeon: Robert Bellow, MD;  Location: [facility] ORS;  Service: General;   Laterality: Left;  . BREAST BIOPSY Left    Dr Orlene Och BREAST METASTATIC CARCINOMA  . BREAST LUMPECTOMY Left 01/12/2018  . OOPHORECTOMY    . PARTIAL MASTECTOMY WITH NEEDLE LOCALIZATION Left 01/12/2018   Procedure: PARTIAL MASTECTOMY WITH NEEDLE LOCALIZATION;  Surgeon: Robert Bellow, MD;  Location: [facility] ORS;  Service: General;  Laterality: Left;  . PORTACATH PLACEMENT Right 07/14/2017   Procedure: INSERTION PORT-A-CATH;  Surgeon: Robert Bellow, MD;  Location: [facility] ORS;  Service: General;  Laterality: Right;  . SENTINEL NODE BIOPSY Left 01/12/2018   Procedure: SENTINEL NODE BIOPSY;  Surgeon: Robert Bellow, MD;  Location: [facility] ORS;  Service: General;  Laterality: Left;  . TUBAL LIGATION      SOCIAL HISTORY: Social History   Socioeconomic History  . Marital status: Married    Spouse name: Not on file  . Number of children: Not on file  . Years of education: Not on file  . Highest education level: Not on file  Occupational History  . Not on file  Tobacco Use  . Smoking status: Current Every Day Smoker    Packs/day: 1.00    Years: 11.00    Pack years: 11.00    Types: Cigarettes  . Smokeless tobacco: Former Systems developer    Types: Snuff  Vaping Use  . Vaping Use: Never used  Substance and Sexual Activity  . Alcohol use: No    Alcohol/week: 0.0 standard drinks  . Drug use: No  . Sexual activity: Yes  Other Topics Concern  .  Not on file  Social History Narrative  . Not on file   Social Determinants of Health   Financial Resource Strain: Low Risk   . Difficulty of Paying Living Expenses: Not very hard  Food Insecurity:   . Worried About Charity fundraiser in the Last Year: Not on file  . Ran Out of Food in the Last Year: Not on file  Transportation Needs:   . Lack of Transportation (Medical): Not on file  . Lack of Transportation (Non-Medical): Not on file  Physical Activity:   . Days of Exercise per Week: Not on file  . Minutes of Exercise per Session: Not on  file  Stress:   . Feeling of Stress : Not on file  Social Connections:   . Frequency of Communication with Friends and Family: Not on file  . Frequency of Social Gatherings with Friends and Family: Not on file  . Attends Religious Services: Not on file  . Active Member of Clubs or Organizations: Not on file  . Attends Archivist Meetings: Not on file  . Marital Status: Not on file  Intimate Partner Violence:   . Fear of Current or Ex-Partner: Not on file  . Emotionally Abused: Not on file  . Physically Abused: Not on file  . Sexually Abused: Not on file    FAMILY HISTORY: Family History  Problem Relation Age of Onset  . Colon cancer Mother   . Cancer Mother   . Other Father        No info about father or paternal relatives  . Diabetes Brother   . Pancreatitis Brother   . Prostate cancer Brother 70       currently 66 / maternal half-brother  . Breast cancer Maternal Grandmother 40       deceased 62s  . Colon cancer Maternal Grandmother   . Breast cancer Maternal Aunt 65       currently 51  . Breast cancer Other 90       mother's sister; deceased 105  . Breast cancer Other        mother's sister; age at dx unknown    ALLERGIES:  is allergic to aspirin.  MEDICATIONS:  Current Outpatient Medications  Medication Sig Dispense Refill  . anastrozole (ARIMIDEX) 1 MG tablet Take 1 tablet (1 mg total) by mouth daily. 90 tablet 3  . aspirin-acetaminophen-caffeine (EXCEDRIN MIGRAINE) 250-250-65 MG tablet Take 2 tablets by mouth daily as needed for headache.     [name] atorvastatin (LIPITOR) 20 MG tablet Take 1 tablet (20 mg total) by mouth daily. 90 tablet 3  . canagliflozin (INVOKANA) 300 MG TABS tablet Take 1 tablet (300 mg total) by mouth daily. 90 tablet 3  . Dulaglutide (TRULICITY) 5.80 DX/8.3JA SOPN Inject 0.75 mg into the skin once a week. 6 mL 4  . DULoxetine (CYMBALTA) 60 MG capsule Take 60 mg by mouth daily.    [name] glucose blood (ONE TOUCH ULTRA TEST) test strip Use  up to 4 times/day 100 each 12  . goserelin (ZOLADEX) 3.6 MG injection Inject 3.6 mg into the skin every 28 (twenty-eight) days.    . hydrOXYzine (VISTARIL) 50 MG capsule Take 50 mg by mouth 2 (two) times daily as needed.     . Insulin Glargine (BASAGLAR KWIKPEN) 100 UNIT/ML Inject 15 Units into the skin at bedtime. 9 mL 4  . Insulin Pen Needle 31G X 8 MM MISC Use daily to administer insulin 100 each 12  . lidocaine-prilocaine (  EMLA) cream Apply to affected area once 30 g 3  . oxyCODONE (OXY IR/ROXICODONE) 5 MG immediate release tablet Take 1 tablet (5 mg total) by mouth 2 (two) times daily as needed for severe pain. Must last 30 days 60 tablet 0  . pregabalin (LYRICA) 150 MG capsule Take 1 capsule (150 mg total) by mouth 2 (two) times daily. 60 capsule 2  . sertraline (ZOLOFT) 50 MG tablet Take 50 mg by mouth every morning.     No current facility-administered medications for this visit.   Facility-Administered Medications Ordered in Other Visits  Medication Dose Route Frequency Provider Last Rate Last Admin  . heparin lock flush 100 unit/mL  500 Units Intravenous Once Corcoran, Melissa C, MD      . sodium chloride flush (NS) 0.9 % injection 10 mL  10 mL Intravenous Once Lequita Asal, MD          .  PHYSICAL EXAMINATION: ECOG PERFORMANCE STATUS: 0 - Asymptomatic  Vitals:   10/21/19 1331  BP: 96/72  Pulse: 96  Resp: 16  Temp: 98.2 F (36.8 C)  SpO2: 95%   Filed Weights   10/21/19 1331  Weight: 172 lb (78 kg)    Physical Exam HENT:     Head: Normocephalic and atraumatic.     Mouth/Throat:     Pharynx: No oropharyngeal exudate.  Eyes:     Pupils: Pupils are equal, round, and reactive to light.  Cardiovascular:     Rate and Rhythm: Normal rate and regular rhythm.  Pulmonary:     Effort: No respiratory distress.     Breath sounds: No wheezing.  Abdominal:     General: Bowel sounds are normal. There is no distension.     Palpations: Abdomen is soft. There is no  mass.     Tenderness: There is no abdominal tenderness. There is no guarding or rebound.  Musculoskeletal:        General: No tenderness. Normal range of motion.     Cervical back: Normal range of motion and neck supple.  Skin:    General: Skin is warm.  Neurological:     Mental Status: Carolyn Roy is alert and oriented to person, place, and time.  Psychiatric:        Mood and Affect: Affect normal.      LABORATORY DATA:  I have reviewed the data as listed Lab Results  Component Value Date   WBC 7.5 10/21/2019   HGB 14.3 10/21/2019   HCT 42.6 10/21/2019   MCV 93.2 10/21/2019   PLT 214 10/21/2019   Recent Labs    04/19/19 1028 06/03/19 1435 07/19/19 1326 10/18/19 1418 10/21/19 1316  NA 139   < > 138 138 138  K 4.6   < > 4.1 4.0 3.6  CL 103   < > 102 101 103  CO2 28   < > $R'27 23 26  'JF$ GLUCOSE 256*   < > 329* 138* 162*  BUN 16   < > $R'10 14 13  'Zy$ CREATININE 0.49   < > 0.80 0.73 0.60  CALCIUM 9.3   < > 9.1 9.5 8.7*  GFRNONAA >60   < > >60 95 >60  GFRAA >60   < > >60 110 >60  PROT 6.5  --  7.1  --  7.2  ALBUMIN 3.8  --  3.8  --  3.9  AST 14*  --  16  --  14*  ALT 15  --  17  --  11  ALKPHOS 79  --  111  --  80  BILITOT 0.6  --  0.7  --  0.6   < > = values in this interval not displayed.    RADIOGRAPHIC STUDIES: I have personally reviewed the radiological images as listed and agreed with the findings in the report. No results found.  ASSESSMENT & PLAN:   Malignant neoplasm of lower-outer quadrant of left breast of female, estrogen receptor positive ([place]) # breast cancer Stage III ER/PR pos her 2 NEG. currently on anastrazole+ Zoldadex.  No clinical evidence of recurrence. STABLE.  Zoladex today.  #Continue current therapy; tolerating fairly well except for continued joint pains.  Zometa q 6 months; zometa at next visit.  Calcium normal   # Depression/ Insomnia- STABLE;  On cymblata/Zoloft-[ Dr.kapur]  # Joint pains-STABLE; sec to AI-  on vit D/continue Osteo  Bi-Flex/Neurontin.  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 STABLE.   # DM-2 on insulin- STABLE [z1c-7.5]Blood glucose-162;   Defer to PCP  # DISPOSITION: # Zoladex  today.  # Follow up in 3 month;  MD;cbc/cmp/ca-27-29; Zoladex/ Zometa; port flush. Dr.B  All questions were answered. The patient knows to call the clinic with any problems, questions or concerns.    Cammie Sickle, MD 10/21/2019 3:02 PM

## 2019-10-21 NOTE — Telephone Encounter (Signed)
PA for Trulicity initiated and submitted via Cover My Meds. Key: [id number]

## 2019-10-22 LAB — CANCER ANTIGEN 27.29: CA 27.29: 19.5 U/mL (ref 0.0–38.6)

## 2019-10-22 NOTE — Telephone Encounter (Signed)
PA approved. Called and LVM letting patient know of approval.  

## 2019-10-30 ENCOUNTER — Encounter: Payer: Self-pay | Admitting: *Deleted

## 2019-10-30 ENCOUNTER — Telehealth: Payer: Self-pay | Admitting: *Deleted

## 2019-10-30 DIAGNOSIS — Z79811 Long term (current) use of aromatase inhibitors: Secondary | ICD-10-CM

## 2019-10-30 NOTE — Telephone Encounter (Signed)
[employer] faxed request for consideration of ordering a bone density scan due to patient being on Airmidex.   Dr. Rogue Bussing please advise.

## 2019-10-30 NOTE — Addendum Note (Signed)
Addended by: Gloris Ham on: 10/30/2019 02:01 PM   Modules accepted: Orders

## 2019-10-30 NOTE — Telephone Encounter (Signed)
Carolyn Roy, please arrange for bone density test. Thanks. Carolyn Roy

## 2019-10-30 NOTE — Telephone Encounter (Signed)
Heather- please order the BMD- Thanks GB

## 2019-11-04 ENCOUNTER — Other Ambulatory Visit: Payer: Self-pay

## 2019-11-04 ENCOUNTER — Ambulatory Visit
Payer: No Typology Code available for payment source | Attending: Pain Medicine | Admitting: Student in an Organized Health Care Education/Training Program

## 2019-11-04 ENCOUNTER — Ambulatory Visit: Payer: No Typology Code available for payment source

## 2019-11-04 ENCOUNTER — Encounter: Payer: Self-pay | Admitting: Student in an Organized Health Care Education/Training Program

## 2019-11-04 VITALS — BP 116/95 | HR 105 | Temp 97.5°F | Resp 16 | Ht 64.0 in | Wt 172.0 lb

## 2019-11-04 DIAGNOSIS — M5137 Other intervertebral disc degeneration, lumbosacral region: Secondary | ICD-10-CM | POA: Diagnosis present

## 2019-11-04 DIAGNOSIS — M25562 Pain in left knee: Secondary | ICD-10-CM

## 2019-11-04 DIAGNOSIS — M25551 Pain in right hip: Secondary | ICD-10-CM | POA: Diagnosis present

## 2019-11-04 DIAGNOSIS — G5793 Unspecified mononeuropathy of bilateral lower limbs: Secondary | ICD-10-CM | POA: Diagnosis not present

## 2019-11-04 DIAGNOSIS — M25561 Pain in right knee: Secondary | ICD-10-CM | POA: Diagnosis present

## 2019-11-04 DIAGNOSIS — G8929 Other chronic pain: Secondary | ICD-10-CM | POA: Diagnosis present

## 2019-11-04 DIAGNOSIS — M25552 Pain in left hip: Secondary | ICD-10-CM | POA: Diagnosis present

## 2019-11-04 DIAGNOSIS — G894 Chronic pain syndrome: Secondary | ICD-10-CM

## 2019-11-04 DIAGNOSIS — M79672 Pain in left foot: Secondary | ICD-10-CM | POA: Insufficient documentation

## 2019-11-04 DIAGNOSIS — M792 Neuralgia and neuritis, unspecified: Secondary | ICD-10-CM | POA: Insufficient documentation

## 2019-11-04 DIAGNOSIS — M79671 Pain in right foot: Secondary | ICD-10-CM | POA: Diagnosis not present

## 2019-11-04 MED ORDER — ALPHA-LIPOIC ACID 600 MG PO CAPS: 600.0000 mg | ORAL_CAPSULE | Freq: Every day | ORAL | 2 refills | Status: DC

## 2019-11-04 MED ORDER — OXYCODONE HCL 5 MG PO TABS: 5.0000 mg | ORAL_TABLET | Freq: Two times a day (BID) | ORAL | 0 refills | Status: DC | PRN

## 2019-11-04 MED ORDER — PREGABALIN 150 MG PO CAPS: 150.0000 mg | ORAL_CAPSULE | Freq: Two times a day (BID) | ORAL | 5 refills | Status: DC

## 2019-11-04 NOTE — Progress Notes (Signed)
Nursing Pain Medication Assessment:  Safety precautions to be maintained throughout the outpatient stay will include: orient to surroundings, keep bed in low position, maintain call bell within reach at all times, provide assistance with transfer out of bed and ambulation.  Medication Inspection Compliance: Pill count conducted under aseptic conditions, in front of the patient. Neither the pills nor the bottle was removed from the patient's sight at any time. Once count was completed pills were immediately returned to the patient in their original bottle.  Medication: Oxycodone IR Pill/Patch Count: 12 of 60 pills remain Pill/Patch Appearance: Markings consistent with prescribed medication Bottle Appearance: Standard pharmacy container. Clearly labeled. Filled Date: 08 / 31 / 2021 Last Medication intake:  Today

## 2019-11-04 NOTE — Progress Notes (Signed)
PROVIDER NOTE: Information contained herein reflects review and annotations entered in association with encounter. Interpretation of such information and data should be left to medically-trained personnel. Information provided to patient can be located elsewhere in the medical record under "Patient Instructions". Document created using STT-dictation technology, any transcriptional errors that may result from process are unintentional.    Patient: Carolyn Roy  Service Category: E/M  Provider: Gillis Santa, MD  DOB: Feb 28, 1965  DOS: 11/04/2019  Specialty: Interventional Pain Management  MRN: [id number]  Setting: Ambulatory outpatient  PCP: Guadalupe Maple, MD  Type: Established Patient    Referring Provider: Guadalupe Maple, MD  Location: Office  Delivery: Face-to-face     HPI  Reason for encounter: [name], a 54 y.o. year old female, is here today for evaluation and management of her Chronic pain of both feet [M79.671, G89.29, M79.672]. [name] primary complain today is Joint Pain (generalized joint pain from Chemo), Knee Pain (right ), Back Pain (lumbar bilateral), and Hip Pain (right ) Last encounter: Practice (09/18/2019).  Pertinent problems: [name] has Chronic fatigue; Nicotine dependence, cigarettes, w unsp disorders; Chemotherapy-induced neuropathy ([place]); Malignant neoplasm of lower-outer quadrant of left breast of female, estrogen receptor positive ([place]); Chronic feet pain (Primary Area of Pain) (Bilateral) (R>L); Chronic knee pain (Secondary Area of Pain) (Bilateral) (R>L); Chronic hand pain (Tertiary Area of Pain) (Bilateral) (R>L); Chronic pain syndrome; Cancer-related pain; Chronic low back pain (Bilateral (L>R) w/o sciatica; Lumbar facet syndrome (Bilateral) (L>R); Idiopathic scoliosis; Chronic sacroiliac joint pain (Left); Chronic pain of knee (Right); Abnormal MRI, lumbar spine (04/20/2018); Spondylosis without myelopathy or radiculopathy, lumbosacral region; DDD  (degenerative disc disease), lumbosacral; and Numbness and tingling of upper extremity (C6/C7 dermatomes) (Right) on their pertinent problem list. Pain Assessment: Severity of Chronic pain is reported as a 3 /10. Location: Back (see visit info for all pain sites.) Lower, Left, Right/possibly back pain into right hip. Onset: More than a month ago. Quality: Constant, Discomfort, Dull, Burning, Other (Comment) (pulling sensation). Timing: Constant. Modifying factor(s): pain medications and ice pack/hot pack. Vitals:  height is _0  (1.626 m) and weight is 172 lb (78 kg). Her temporal temperature is 97.5 F (36.4 C) (abnormal). Her blood pressure is 116/95 (abnormal) and her pulse is 105 (abnormal). Her respiration is 16 and oxygen saturation is 97%.   No change in medical history since last visit.  Patient's pain is at baseline.  Patient continues multimodal pain regimen as prescribed.  States that it provides pain relief and improvement in functional status. Discussed addition of alpha lipoic acid to current regimen.  Patient is on high-dose Lyrica.  Do not recommend further dose increase.  Is currently on Cymbalta as well.  Pharmacotherapy Assessment   10/08/2019  1   09/18/2019  Oxycodone Hcl 5 MG Tablet  60.00  30 Fr Nav   [id number]   Nor (1409)   0/0  15.00 MME  Comm Ins   [place]     Analgesic: Oxycodone 5 mg twice daily as needed, quantity 60/month MME equals 15    Monitoring: [place] PMP: PDMP not reviewed this encounter.       Pharmacotherapy: No side-effects or adverse reactions reported. Compliance: No problems identified. Effectiveness: Clinically acceptable.  Janett Billow, RN  11/04/2019 11:42 AM  Sign when Signing Visit Nursing Pain Medication Assessment:  Safety precautions to be maintained throughout the outpatient stay will include: orient to surroundings, keep bed in low position, maintain call bell within reach at  all times, provide assistance with transfer out of bed and  ambulation.  Medication Inspection Compliance: Pill count conducted under aseptic conditions, in front of the patient. Neither the pills nor the bottle was removed from the patient's sight at any time. Once count was completed pills were immediately returned to the patient in their original bottle.  Medication: Oxycodone IR Pill/Patch Count: 12 of 60 pills remain Pill/Patch Appearance: Markings consistent with prescribed medication Bottle Appearance: Standard pharmacy container. Clearly labeled. Filled Date: 08 / 31 / 2021 Last Medication intake:  Today    UDS:  Summary  Date Value Ref Range Status  09/18/2019 Note  Final    Comment:    ==================================================================== [employer] (MW) ==================================================================== Test                             Result       Flag       Units  Drug Present and Declared for Prescription Verification   Oxazepam                       63           EXPECTED   ng/mg creat   Temazepam                      182          EXPECTED   ng/mg creat    Oxazepam and temazepam are expected metabolites of diazepam.    Oxazepam is also an expected metabolite of other benzodiazepine    drugs, including chlordiazepoxide, prazepam, clorazepate, halazepam,    and temazepam.  Oxazepam and temazepam are available as scheduled    prescription medications.    Oxycodone                      1417         EXPECTED   ng/mg creat   Oxymorphone                    545          EXPECTED   ng/mg creat   Noroxycodone                   2620         EXPECTED   ng/mg creat   Noroxymorphone                 188          EXPECTED   ng/mg creat    Sources of oxycodone are scheduled prescription medications.    Oxymorphone, noroxycodone, and noroxymorphone are expected    metabolites of oxycodone. Oxymorphone is also available as a    scheduled prescription medication.  Drug Present not Declared for  Prescription Verification   Alcohol, Ethyl                 0.043        UNEXPECTED g/dL    Sources of ethyl alcohol include alcoholic beverages or as a    fermentation product of glucose; glucose is present in this specimen.    Interpret result with caution, as the presence of ethyl alcohol is    likely due, at least in part, to fermentation of glucose.  ==================================================================== Test  Result    Flag   Units      Ref Range   Creatinine              82               mg/dL      >=20 ==================================================================== Declared Medications:  The flagging and interpretation on this report are based on the  following declared medications.  Unexpected results may arise from  inaccuracies in the declared medications.   **Note: The testing scope of this panel includes these medications:   Oxycodone  Temazepam (Restoril)   **Note: The testing scope of this panel does not include the  following reported medications:   Acetaminophen (Excedrin)  Albuterol (Ventolin HFA)  Anastrozole (Arimidex)  Aspirin (Excedrin)  Atorvastatin (Lipitor)  Caffeine (Excedrin)  Canagliflozin (Invokana)  Duloxetine (Cymbalta)  Hydroxyzine (Vistaril)  Ondansetron (Zofran)  Pregabalin (Lyrica)  Prilocaine (EMLA)  Prochlorperazine (Compazine)  Semaglutide (Ozempic)  Sertraline (Zoloft)  Topical Lidocaine (EMLA) ==================================================================== For clinical consultation, please call [phone]. ====================================================================      ROS  Constitutional: Denies any fever or chills Gastrointestinal: No reported hemesis, hematochezia, vomiting, or acute GI distress Musculoskeletal: Denies any acute onset joint swelling, redness, loss of ROM, or weakness Neurological: Paresthesias bilateral lower extremity  Medication Review   Alpha-Lipoic Acid, Basaglar KwikPen, DULoxetine, Dulaglutide, Insulin Pen Needle, anastrozole, aspirin-acetaminophen-caffeine, atorvastatin, canagliflozin, glucose blood, goserelin, oxyCODONE, pregabalin, and sertraline  History Review  Allergy: [name] is allergic to aspirin. Drug: [name]  reports no history of drug use. Alcohol:  reports no history of alcohol use. Tobacco:  reports that she has been smoking cigarettes. She has a 11.00 pack-year smoking history. She has quit using smokeless tobacco.  Her smokeless tobacco use included snuff. Social: [name]  reports that she has been smoking cigarettes. She has a 11.00 pack-year smoking history. She has quit using smokeless tobacco.  Her smokeless tobacco use included snuff. She reports that she does not drink alcohol and does not use drugs. Medical:  has a past medical history of Allergy, Breast cancer ([place]), Cancer ([place]) (06/15/2017), Depression, Diabetes mellitus without complication ([place]) (8527), Endometriosis, Family history of breast cancer, Headache, Hyperlipidemia, Ovarian mass, Personal history of chemotherapy, Personal history of radiation therapy, and Pneumonia. Surgical: [name]  has a past surgical history that includes Oophorectomy; Tubal ligation; Portacath placement (Right, 07/14/2017); Axillary lymph node biopsy (Left, 07/14/2017); Breast lumpectomy (Left, 01/12/2018); Breast biopsy (Left); Partial mastectomy with needle localization (Left, 01/12/2018); and Sentinel node biopsy (Left, 01/12/2018). Family: family history includes Breast cancer in an other family member; Breast cancer (age of onset: 61) in her maternal grandmother; Breast cancer (age of onset: 87) in an other family member; Breast cancer (age of onset: 22) in her maternal aunt; Cancer in her mother; Colon cancer in her maternal grandmother and mother; Diabetes in her brother; Other in her father; Pancreatitis in her brother; Prostate cancer (age of onset: 39) in her  brother.  Laboratory Chemistry Profile   Renal Lab Results  Component Value Date   BUN 13 10/21/2019   CREATININE 0.60 10/21/2019   BCR 19 10/18/2019   GFRAA >60 10/21/2019   GFRNONAA >60 10/21/2019     Hepatic Lab Results  Component Value Date   AST 14 (L) 10/21/2019   ALT 11 10/21/2019   ALBUMIN 3.9 10/21/2019   ALKPHOS 80 10/21/2019     Electrolytes Lab Results  Component Value Date   NA 138 10/21/2019   K 3.6  10/21/2019   CL 103 10/21/2019   CALCIUM 8.7 (L) 10/21/2019   MG 1.9 11/23/2017     Bone Lab Results  Component Value Date   VD25OH 20.0 (L) 12/13/2017     Inflammation (CRP: Acute Phase) (ESR: Chronic Phase) Lab Results  Component Value Date   CRP 7 01/22/2018   ESRSEDRATE 27 01/22/2018       Note: Above Lab results reviewed.  Recent Imaging Review  MM DIAG BREAST TOMO BILATERAL CLINICAL DATA:  Left lumpectomy.  Annual mammography.  EXAM: DIGITAL DIAGNOSTIC BILATERAL MAMMOGRAM WITH TOMO AND CAD  COMPARISON:  Previous exam(s).  ACR Breast Density Category b: There are scattered areas of fibroglandular density.  FINDINGS: The left lumpectomy site is stable. No suspicious masses, calcifications, or distortion are seen in either breast.  Mammographic images were processed with CAD.  IMPRESSION: No mammographic evidence of malignancy.  RECOMMENDATION: Annual diagnostic mammography.  I have discussed the findings and recommendations with the patient. If applicable, a reminder letter will be sent to the patient regarding the next appointment.  BI-RADS CATEGORY  2: Benign.  Electronically Signed   By: Dorise Bullion III M.D   On: 09/09/2019 13:54 Note: Reviewed        Physical Exam  General appearance: Well nourished, well developed, and well hydrated. In no apparent acute distress Mental status: Alert, oriented x 3 (person, place, & time)       Respiratory: No evidence of acute respiratory distress Eyes: PERLA Vitals: BP (!)  116/95 (BP Location: Right Arm, Patient Position: Sitting, Cuff Size: Normal)   Pulse (!) 105   Temp (!) 97.5 F (36.4 C) (Temporal)   Resp 16   Ht _0  (1.626 m)   Wt 172 lb (78 kg)   LMP  (LMP Unknown) Comment: LAST PERIOD IN MAY 2019 WHEN SHE STARTED CHEMO  SpO2 97%   BMI 29.52 kg/m  BMI: Estimated body mass index is 29.52 kg/m as calculated from the following:   Height as of this encounter: _1  (1.626 m).   Weight as of this encounter: 172 lb (78 kg). Ideal: Ideal body weight: 54.7 kg (120 lb 9.5 oz) Adjusted ideal body weight: 64 kg (141 lb 2.5 oz)   Lumbar Spine Area Exam  Skin & Axial Inspection: No masses, redness, or swelling Alignment: Symmetrical Functional ROM: Pain restricted ROM       Stability: No instability detected Muscle Tone/Strength: Functionally intact. No obvious neuro-muscular anomalies detected. Sensory (Neurological): Musculoskeletal pain pattern Provocative Tests: Hyperextension/rotation test: (+) bilaterally for facet joint pain. Lumbar quadrant test (Kemp's test): deferred today       Lateral bending test: deferred today       Patrick's Maneuver: (+) for bilateral S-I arthralgia             FABER* test: deferred today                   S-I anterior distraction/compression test: deferred today         S-I lateral compression test: deferred today         S-I Thigh-thrust test: deferred today         S-I Gaenslen's test: deferred today         *(Flexion, ABduction and External Rotation) Gait & Posture Assessment  Ambulation: Unassisted Gait: Relatively normal for age and body habitus Posture: WNL  Lower Extremity Exam    Side: Right lower extremity  Side: Left lower extremity  Stability: No instability  observed          Stability: No instability observed          Skin & Extremity Inspection: Skin color, temperature, and hair growth are WNL. No peripheral edema or cyanosis. No masses, redness, swelling, asymmetry, or associated skin lesions. No  contractures.  Skin & Extremity Inspection: Skin color, temperature, and hair growth are WNL. No peripheral edema or cyanosis. No masses, redness, swelling, asymmetry, or associated skin lesions. No contractures.  Functional ROM: Pain restricted ROM for hip and knee joints          Functional ROM: Pain restricted ROM for hip and knee joints          Muscle Tone/Strength: Functionally intact. No obvious neuro-muscular anomalies detected.  Muscle Tone/Strength: Functionally intact. No obvious neuro-muscular anomalies detected.  Sensory (Neurological): Musculoskeletal pain pattern        Sensory (Neurological): Musculoskeletal pain pattern        DTR: Patellar: deferred today Achilles: deferred today Plantar: deferred today  DTR: Patellar: deferred today Achilles: deferred today Plantar: deferred today  Palpation: No palpable anomalies  Palpation: No palpable anomalies    Assessment   Status Diagnosis  Controlled Controlled Controlled 1. Chronic feet pain (Primary Area of Pain) (Bilateral) (R>L)   2. Neuropathic pain   3. Neuropathic pain of feet (Bilateral)   4. Chronic pain syndrome   5. Chronic knee pain (Secondary Area of Pain) (Bilateral) (R>L)   6. Chronic hip pain (Fourth Area of Pain) (Bilateral) (R>L)   7. DDD (degenerative disc disease), lumbosacral      Updated Problems: Problem  Numbness and tingling of upper extremity (C6/C7 dermatomes) (Right)  Spondylosis Without Myelopathy Or Radiculopathy, Lumbosacral Region  Ddd (Degenerative Disc Disease), Lumbosacral  Abnormal MRI, lumbar spine (04/20/2018)   FINDINGS: Alignment:  Trace facet mediated anterolisthesis L3-4 and L4-5.  Disc levels: L3-4: Trace anterolisthesis. Annular bulge. Facet arthropathy with joint effusions, greater on the LEFT. No impingement. L4-5: Trace anterolisthesis. Annular bulge. Facet arthropathy and ligamentum flavum hypertrophy. Small joint effusions. No impingement. L5-S1:  Normal disc space.   Mild facet arthropathy.  No impingement.  IMPRESSION: Degenerative trace anterolisthesis L3-4 with annular bulge. Facet arthropathy with joint effusions, greater on the LEFT. No impingement.  Degenerative trace anterolisthesis L4-5 with annular bulge. Facet arthropathy with joint effusions, but no impingement.   Chronic low back pain (Bilateral (L>R) w/o sciatica  Lumbar facet syndrome (Bilateral) (L>R)  Idiopathic Scoliosis  Chronic sacroiliac joint pain (Left)  Chronic pain of knee (Right)  Cancer-Related Pain  Chronic feet pain (Primary Area of Pain) (Bilateral) (R>L)  Chronic knee pain (Secondary Area of Pain) (Bilateral) (R>L)  Chronic hand pain (Tertiary Area of Pain) (Bilateral) (R>L)  Chronic Pain Syndrome  Malignant Neoplasm of Lower-Outer Quadrant of Left Breast of Female, Estrogen Receptor Positive ([facility])  Chemotherapy-Induced Neuropathy ([facility])  Nicotine Dependence, Cigarettes, W Unsp Disorders  Chronic Fatigue    Plan of Care  Ms. JAZARI OBER has a current medication list which includes the following long-term medication(s): atorvastatin, basaglar Claiborne Rigg, [START ON 11/07/2019] oxycodone, [START ON 12/07/2019] oxycodone, [START ON 01/06/2020] oxycodone, sertraline, and pregabalin.  Pharmacotherapy (Medications Ordered): Meds ordered this encounter  Medications  . pregabalin (LYRICA) 150 MG capsule    Sig: Take 1 capsule (150 mg total) by mouth 2 (two) times daily.    Dispense:  60 capsule    Refill:  5    Fill one day early if pharmacy is closed on scheduled refill date.  May substitute for generic if available.  [name] oxyCODONE (OXY IR/ROXICODONE) 5 MG immediate release tablet    Sig: Take 1 tablet (5 mg total) by mouth 2 (two) times daily as needed for severe pain. Must last 30 days    Dispense:  60 tablet    Refill:  0    Chronic Pain: STOP Act (Not applicable) Fill 1 day early if closed on refill date.  [name] oxyCODONE (OXY IR/ROXICODONE) 5 MG immediate release  tablet    Sig: Take 1 tablet (5 mg total) by mouth 2 (two) times daily as needed for severe pain. Must last 30 days    Dispense:  60 tablet    Refill:  0    Chronic Pain: STOP Act (Not applicable) Fill 1 day early if closed on refill date.  [name] oxyCODONE (OXY IR/ROXICODONE) 5 MG immediate release tablet    Sig: Take 1 tablet (5 mg total) by mouth 2 (two) times daily as needed for severe pain. Must last 30 days    Dispense:  60 tablet    Refill:  0    Chronic Pain: STOP Act (Not applicable) Fill 1 day early if closed on refill date.  . Alpha-Lipoic Acid 600 MG CAPS    Sig: Take 1 capsule (600 mg total) by mouth daily.    Dispense:  30 capsule    Refill:  2    Do not place medication on "Automatic Refill". Fill one day early if pharmacy is closed on scheduled refill date.    Follow-up plan:   Return in about 3 months (around 02/03/2020) for Medication Management, in person.   Recent Visits Date Type Provider Dept  09/18/19 Office Visit Milinda Pointer, MD [facility]  Showing recent visits within past 90 days and meeting all other requirements Today's Visits Date Type Provider Dept  11/04/19 Office Visit Gillis Santa, MD [facility]  Showing today's visits and meeting all other requirements Future Appointments No visits were found meeting these conditions. Showing future appointments within next 90 days and meeting all other requirements  I discussed the assessment and treatment plan with the patient. The patient was provided an opportunity to ask questions and all were answered. The patient agreed with the plan and demonstrated an understanding of the instructions.  Patient advised to call back or seek an in-person evaluation if the symptoms or condition worsens.  Duration of encounter:30 minutes.  Note by: Gillis Santa, MD Date: 11/04/2019; Time: 12:33 PM

## 2019-11-25 ENCOUNTER — Telehealth (INDEPENDENT_AMBULATORY_CARE_PROVIDER_SITE_OTHER): Payer: Self-pay | Admitting: Gastroenterology

## 2019-11-25 DIAGNOSIS — Z1211 Encounter for screening for malignant neoplasm of colon: Secondary | ICD-10-CM

## 2019-11-25 DIAGNOSIS — Z800 Family history of malignant neoplasm of digestive organs: Secondary | ICD-10-CM

## 2019-11-25 MED ORDER — GOLYTELY 236 G PO SOLR
4000.0000 mL | Freq: Once | ORAL | 0 refills | Status: AC
Start: 2019-11-25 — End: 2019-11-25

## 2019-11-25 NOTE — Progress Notes (Signed)
Gastroenterology Pre-Procedure Review  Request Date: Friday 12/27/19 Requesting Physician: Dr. Bonna Gains  PATIENT REVIEW QUESTIONS: The patient responded to the following health history questions as indicated:    1. Are you having any GI issues? yes (Patient has recently completed breast cancer treatment.  states food gets stuck.) 2. Do you have a personal history of Polyps? no 3. Do you have a family history of Colon Cancer or Polyps? yes (mother died of stage 4 colon cancer in june) 4. Diabetes Mellitus? no 5. Joint replacements in the past 12 months?no 6. Major health problems in the past 3 months?no 7. Any artificial heart valves, MVP, or defibrillator?no    MEDICATIONS & ALLERGIES:    Patient reports the following regarding taking any anticoagulation/antiplatelet therapy:   Plavix, Coumadin, Eliquis, Xarelto, Lovenox, Pradaxa, Brilinta, or Effient? no Aspirin? no  Patient confirms/reports the following medications:  Current Outpatient Medications  Medication Sig Dispense Refill  . Alpha-Lipoic Acid 600 MG CAPS Take 1 capsule (600 mg total) by mouth daily. 30 capsule 2  . anastrozole (ARIMIDEX) 1 MG tablet Take 1 tablet (1 mg total) by mouth daily. 90 tablet 3  . aspirin-acetaminophen-caffeine (EXCEDRIN MIGRAINE) 250-250-65 MG tablet Take 2 tablets by mouth daily as needed for headache.     [name] atorvastatin (LIPITOR) 20 MG tablet Take 1 tablet (20 mg total) by mouth daily. 90 tablet 3  . canagliflozin (INVOKANA) 300 MG TABS tablet Take 1 tablet (300 mg total) by mouth daily. 90 tablet 3  . Dulaglutide (TRULICITY) 1.61 WR/6.0AV SOPN Inject 0.75 mg into the skin once a week. 6 mL 4  . DULoxetine (CYMBALTA) 60 MG capsule Take 60 mg by mouth daily.    [name] glucose blood (ONE TOUCH ULTRA TEST) test strip Use up to 4 times/day 100 each 12  . goserelin (ZOLADEX) 3.6 MG injection Inject 3.6 mg into the skin every 28 (twenty-eight) days.    . Insulin Glargine (BASAGLAR KWIKPEN) 100 UNIT/ML  Inject 15 Units into the skin at bedtime. 9 mL 4  . Insulin Pen Needle 31G X 8 MM MISC Use daily to administer insulin 100 each 12  . oxyCODONE (OXY IR/ROXICODONE) 5 MG immediate release tablet Take 1 tablet (5 mg total) by mouth 2 (two) times daily as needed for severe pain. Must last 30 days 60 tablet 0  . [START ON 12/07/2019] oxyCODONE (OXY IR/ROXICODONE) 5 MG immediate release tablet Take 1 tablet (5 mg total) by mouth 2 (two) times daily as needed for severe pain. Must last 30 days 60 tablet 0  . [START ON 01/06/2020] oxyCODONE (OXY IR/ROXICODONE) 5 MG immediate release tablet Take 1 tablet (5 mg total) by mouth 2 (two) times daily as needed for severe pain. Must last 30 days 60 tablet 0  . pregabalin (LYRICA) 150 MG capsule Take 1 capsule (150 mg total) by mouth 2 (two) times daily. 60 capsule 5  . sertraline (ZOLOFT) 50 MG tablet Take 50 mg by mouth every morning.     No current facility-administered medications for this visit.   Facility-Administered Medications Ordered in Other Visits  Medication Dose Route Frequency Provider Last Rate Last Admin  . heparin lock flush 100 unit/mL  500 Units Intravenous Once Corcoran, Melissa C, MD      . sodium chloride flush (NS) 0.9 % injection 10 mL  10 mL Intravenous Once Lequita Asal, MD        Patient confirms/reports the following allergies:  Allergies  Allergen Reactions  . Aspirin Nausea And  Vomiting    No orders of the defined types were placed in this encounter.   AUTHORIZATION INFORMATION Primary Insurance: 1D#: Group #:  Secondary Insurance: 1D#: Group #:  SCHEDULE INFORMATION: Date: Friday 12/27/19 Time: Location:[place]

## 2019-12-02 ENCOUNTER — Other Ambulatory Visit: Payer: Self-pay

## 2019-12-02 ENCOUNTER — Ambulatory Visit
Admission: RE | Admit: 2019-12-02 | Discharge: 2019-12-02 | Disposition: A | Discharge status: Home / Self Care | Payer: No Typology Code available for payment source | Source: Ambulatory Visit | Attending: Radiation Oncology | Admitting: Radiation Oncology

## 2019-12-02 ENCOUNTER — Encounter: Payer: Self-pay | Admitting: Radiation Oncology

## 2019-12-02 VITALS — BP 93/77 | HR 89 | Temp 96.9°F | Wt 176.6 lb

## 2019-12-02 DIAGNOSIS — C50512 Malignant neoplasm of lower-outer quadrant of left female breast: Secondary | ICD-10-CM

## 2019-12-02 DIAGNOSIS — Z170 Estrogen receptor positive status [ER+]: Secondary | ICD-10-CM

## 2019-12-02 NOTE — Progress Notes (Signed)
Survivorship Care Plan visit completed.  Treatment summary reviewed and given to patient.  ASCO answers booklet reviewed and given to patient.  CARE program and Cancer Transitions discussed with patient along with other resources cancer center offers to patients and caregivers.  Patient verbalized understanding.   Patient would like to see Carolyn Roy for Lymphedema screening.  Patient in agreement for APP to have a  visit to introduce them to the Survivorship Clinic.  Encouraged patient to call for any questions or concerns.

## 2019-12-02 NOTE — Progress Notes (Signed)
Radiation Oncology Follow up Note  Name: Carolyn Roy   Date:   12/02/2019 MRN:  [id number] DOB: 29-Jun-1965    This 54 y.o. 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  REFERRING PROVIDER: Guadalupe Maple, MD  HPI: Patient is a 54 year old female now out 18 months having completed whole breast radiation to her left breast for stage III invasive mammary carcinoma.  She is seen today in routine follow-up is doing well from a breast standpoint she specifically denies breast tenderness cough or bone pain.  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  She is currently seeing pain management for back pain.  She is currently on anastrozole tolerating it well without side effect..  She had mammograms back in August which I have reviewed were BI-RADS 2 benign  COMPLICATIONS OF TREATMENT: none  FOLLOW UP COMPLIANCE: keeps appointments   PHYSICAL EXAM:  BP 93/77   Pulse 89   Temp (!) 96.9 F (36.1 C) (Tympanic)   Wt 176 lb 9 oz (80.1 kg)   LMP  (LMP Unknown) Comment: LAST PERIOD IN MAY 2019 WHEN SHE STARTED CHEMO  BMI 30.31 kg/m  Lungs are clear to A&P cardiac examination essentially unremarkable with regular rate and rhythm. No dominant mass or nodularity is noted in either breast in 2 positions examined. Incision is well-healed. No axillary or supraclavicular adenopathy is appreciated. Cosmetic result is excellent.  Well-developed well-nourished patient in NAD. HEENT reveals PERLA, EOMI, discs not visualized.  Oral cavity is clear. No oral mucosal lesions are identified. Neck is clear without evidence of cervical or supraclavicular adenopathy. Lungs are clear to A&P. Cardiac examination is essentially unremarkable with regular rate and rhythm without murmur rub or thrill. Abdomen is benign with no organomegaly or masses  noted. Motor sensory and DTR levels are equal and symmetric in the upper and lower extremities. Cranial nerves II through XII are grossly intact. Proprioception is intact. No peripheral adenopathy or edema is identified. No motor or sensory levels are noted. Crude visual fields are within normal range.  RADIOLOGY RESULTS: Mammograms reviewed compatible with above-stated findings  PLAN: Present time patient is doing well from a breast standpoint with no evidence of disease.  I am pleased with her overall progress.  Of asked to see her back in 1 year for follow-up.  She continues with pain management for her somatic complaints.  She continues on anastrozole without side effect.  Patient knows to call with any concerns.  I would like to take this opportunity to thank you for allowing me to participate in the care of your patient.Noreene Filbert, MD

## 2019-12-20 ENCOUNTER — Other Ambulatory Visit: Payer: Self-pay | Admitting: Nurse Practitioner

## 2019-12-20 NOTE — Telephone Encounter (Signed)
Requested medication (s) are due for refill today: yes  Requested medication (s) are on the active medication list: yes  Last refill: ?  Future visit scheduled: yes  Notes to clinic:    Rx #: [id number]   Pharmacy comment: Alternative Requested:NO LONGER COVERED.  All Pharmacy Suggested Alternatives:   empagliflozin (JARDIANCE) 25 MG TABS tablet dapagliflozin propanediol (FARXIGA) 10 MG TABS tablet  To prescribe one of the alternatives listed above, open the encounter and click Replace.  Open Encounter     Requested Prescriptions  Pending Prescriptions Disp Refills   JARDIANCE 25 MG TABS tablet [Pharmacy Med Name: JARDIANCE 25 [place]  0      Endocrinology:  Diabetes - SGLT2 Inhibitors Failed - 12/20/2019  7:24 AM      Failed - LDL in normal range and within 360 days    LDL Chol Calc (NIH)  Date Value Ref Range Status  10/18/2019 116 (H) 0 - 99 mg/dL Final          Passed - Cr in normal range and within 360 days    Creatinine, Ser  Date Value Ref Range Status  10/21/2019 0.60 0.44 - 1.00 mg/dL Final          Passed - HBA1C is between 0 and 7.9 and within 180 days    HB A1C (BAYER DCA - WAIVED)  Date Value Ref Range Status  10/18/2019 7.5 (H) <7.0 % Final    Comment:                                          Diabetic Adult            <7.0                                       Healthy Adult        4.3 - 5.7                                                           (DCCT/NGSP) American Diabetes Association's Summary of Glycemic Recommendations for Adults with Diabetes: Hemoglobin A1c <7.0%. More stringent glycemic goals (A1c <6.0%) may further reduce complications at the cost of increased risk of hypoglycemia.           Passed - eGFR in normal range and within 360 days    GFR calc Af Amer  Date Value Ref Range Status  10/21/2019 >60 >60 mL/min Final   GFR calc non Af Amer  Date Value Ref Range Status  10/21/2019 >60 >60 mL/min Final          Passed - Valid  encounter within last 6 months    Recent Outpatient Visits           2 months ago Type 2 diabetes mellitus with hyperglycemia, with long-term current use of insulin ([place])   [place], [place] T, NP   6 months ago Hyperlipidemia associated with type 2 diabetes mellitus ([place])   [place], Jolene T, NP   8 months ago Type 2 diabetes mellitus with hyperglycemia, with long-term current  use of insulin ([place])   [place], [place] T, NP   9 months ago Type 2 diabetes mellitus with hyperglycemia, with long-term current use of insulin ([place])   [place], [place] T, NP   2 years ago Poorly controlled type 2 diabetes mellitus ([place])   [place] Kathrine Haddock, NP       Future Appointments             In 1 month [place], Barbaraann Faster, NP [facility]

## 2019-12-25 ENCOUNTER — Inpatient Hospital Stay: Payer: No Typology Code available for payment source | Attending: Oncology | Admitting: Occupational Therapy

## 2019-12-25 ENCOUNTER — Other Ambulatory Visit: Payer: Self-pay

## 2019-12-25 ENCOUNTER — Inpatient Hospital Stay (HOSPITAL_BASED_OUTPATIENT_CLINIC_OR_DEPARTMENT_OTHER): Payer: No Typology Code available for payment source | Admitting: Oncology

## 2019-12-25 ENCOUNTER — Telehealth: Payer: Self-pay

## 2019-12-25 ENCOUNTER — Other Ambulatory Visit
Admission: RE | Admit: 2019-12-25 | Discharge: 2019-12-25 | Disposition: A | Discharge status: Home / Self Care | Payer: No Typology Code available for payment source | Source: Ambulatory Visit | Attending: Gastroenterology | Admitting: Gastroenterology

## 2019-12-25 VITALS — BP 102/69 | HR 92 | Temp 98.2°F | Resp 18 | Wt 175.0 lb

## 2019-12-25 DIAGNOSIS — C50512 Malignant neoplasm of lower-outer quadrant of left female breast: Secondary | ICD-10-CM | POA: Diagnosis not present

## 2019-12-25 DIAGNOSIS — Z923 Personal history of irradiation: Secondary | ICD-10-CM | POA: Diagnosis not present

## 2019-12-25 DIAGNOSIS — C773 Secondary and unspecified malignant neoplasm of axilla and upper limb lymph nodes: Secondary | ICD-10-CM | POA: Diagnosis not present

## 2019-12-25 DIAGNOSIS — Z01812 Encounter for preprocedural laboratory examination: Secondary | ICD-10-CM | POA: Diagnosis not present

## 2019-12-25 DIAGNOSIS — E119 Type 2 diabetes mellitus without complications: Secondary | ICD-10-CM | POA: Diagnosis not present

## 2019-12-25 DIAGNOSIS — Z20822 Contact with and (suspected) exposure to covid-19: Secondary | ICD-10-CM | POA: Diagnosis not present

## 2019-12-25 DIAGNOSIS — Z170 Estrogen receptor positive status [ER+]: Secondary | ICD-10-CM

## 2019-12-25 DIAGNOSIS — Z79899 Other long term (current) drug therapy: Secondary | ICD-10-CM | POA: Diagnosis not present

## 2019-12-25 DIAGNOSIS — E785 Hyperlipidemia, unspecified: Secondary | ICD-10-CM | POA: Diagnosis not present

## 2019-12-25 DIAGNOSIS — I890 Lymphedema, not elsewhere classified: Secondary | ICD-10-CM | POA: Diagnosis not present

## 2019-12-25 DIAGNOSIS — Z9221 Personal history of antineoplastic chemotherapy: Secondary | ICD-10-CM | POA: Insufficient documentation

## 2019-12-25 LAB — SARS CORONAVIRUS 2 (TAT 6-24 HRS): SARS Coronavirus 2: NEGATIVE

## 2019-12-25 NOTE — Telephone Encounter (Signed)
PA for Jardiance submitted and approved via Cover My Meds. Key: [id number]

## 2019-12-25 NOTE — Progress Notes (Signed)
Survivorship Clinic Consult Note [facility]  [phone] [phone]  CLINIC:  Survivorship  REASON FOR VISIT:  Long-term survivorship surveillance visit for patient with history of breast cancer   BRIEF ONCOLOGIC HISTORY:  Oncology History Overview Note  # MAY 2019-  clinical stage IIIA (T3N1Mx) left breast cancer s/p biopsy on 06/14/2017. -Pathology revealed grade III invasive ductal carcinoma. -Axillary FNA revealed malignant cells c/w metastatic carcinoma. Tumor was ER + (90%), PR + (30%), Her2/neu - and Ki67 70%.  CA27.29 was 7.8 on 06/14/2017.  # She received 4 cycles of AC with Neulasta support (07/20/2017 - 08/31/2017).;  neoadjuvant Taxol on 09/14/2017.  #DEC 2019- Lumpectomy/sentinel lymph node biopsy [Dr.[name]]-complete pathologic response  # s/p RT [delayed sec to wound infection; Dr.[name]] finished RT [4/12]  # April 14th 2020- START TAM; stopped in mid-May secondary intolerance [severe migraines].  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 Secord/Berchuck]-negative for malignancy/ # DM-2- poorly controlled.   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   # PAP SMEAR- RECOMMENDED 2022- summer  -------------------------------------------  DIAGNOSIS: left breast cancer  STAGE:  III       ;GOALS: cure  CURRENT/MOST RECENT THERAPY Tam    Cancer of midline of breast, left (HCC)  06/15/2017 Initial Diagnosis   Cancer of midline of breast, left ([place])   07/20/2017 - 11/16/2017 Chemotherapy   The patient had dexamethasone (DECADRON) 4 MG tablet, 1 of 1 cycle, Start date: --, End date: -- DOXOrubicin (ADRIAMYCIN) chemo injection 122 mg, 60 mg/m2 = 122 mg, Intravenous,  Once, 4 of 4 cycles Administration: 122 mg (07/20/2017), 122 mg (08/03/2017), 122 mg (08/17/2017),  122 mg (08/31/2017) palonosetron (ALOXI) injection 0.25 mg, 0.25 mg, Intravenous,  Once, 4 of 4 cycles Administration: 0.25 mg (07/20/2017), 0.25 mg (08/03/2017), 0.25 mg (08/17/2017), 0.25 mg (08/31/2017) pegfilgrastim (NEULASTA) injection 6 mg, 6 mg, Subcutaneous, Once, 5 of 5 cycles Administration: 6 mg (07/21/2017), 6 mg (08/04/2017), 6 mg (08/18/2017), 6 mg (09/01/2017) cyclophosphamide (CYTOXAN) 1,220 mg in sodium chloride 0.9 % 250 mL chemo infusion, 600 mg/m2 = 1,220 mg, Intravenous,  Once, 4 of 4 cycles Administration: 1,220 mg (07/20/2017), 1,220 mg (08/03/2017), 1,220 mg (08/17/2017), 1,220 mg (08/31/2017) PACLitaxel (TAXOL) 162 mg in sodium chloride 0.9 % 250 mL chemo infusion (</= 74m/m2), 80 mg/m2 = 162 mg, Intravenous,  Once, 10 of 12 cycles Dose modification: 65 mg/m2 (original dose 80 mg/m2, Cycle 13, Reason: Provider Judgment, Comment: neuropathy) Administration: 162 mg (09/14/2017), 162 mg (09/21/2017), 162 mg (09/28/2017), 162 mg (10/05/2017), 162 mg (10/12/2017), 162 mg (10/19/2017), 162 mg (10/26/2017), 132 mg (11/02/2017), 132 mg (11/09/2017), 132 mg (11/16/2017) fosaprepitant (EMEND) 150 mg, dexamethasone (DECADRON) 12 mg in sodium chloride 0.9 % 145 mL IVPB, , Intravenous,  Once, 4 of 4 cycles Administration:  (07/20/2017),  (08/03/2017),  (08/17/2017),  (08/31/2017)  for chemotherapy treatment.    Malignant neoplasm of lower-outer quadrant of left breast of female, estrogen receptor positive [place]  11/08/2017 Initial Diagnosis   Malignant neoplasm of lower-outer quadrant of left breast of female, estrogen receptor positive ([place]     INTERVAL HISTORY:  Mrs. WDema Severinis a 54year old female with past medical history significant for COPD, hyperlipidemia, type 2 diabetes, osteoarthritis, depression with recent diagnosis with treatment of left ER/PR positive HER-2/neu negative breast cancer.  She was diagnosed on 11/08/2017 and had lumpectomy on 01/12/2018.  5 lymph nodes were removed and none were  positive.  She then completed 4 cycles of AC last dose given in July 2019 and 10 cycles of single agent Taxol completing in October 2019.  This was discontinued early secondary to neuropathy.  She had radiation from 03/29/18-05/21/18.  She initiated Arimidex in May 2020 and will stay on this for 5 to 10 years.  Today, she complains of joint pain in her hands, elbows, hips and knees.  She is unsure when symptoms started but around chemotherapy and when she initiated anastrozole.  She also has some swelling in her left arm and shoulder.  She is meeting with Luna Fuse to discuss preventative strategies for lymphedema.  ADDITIONAL REVIEW OF SYSTEMS:  Review of Systems  Constitutional: Positive for malaise/fatigue. Negative for chills, fever and weight loss.  HENT: Negative for congestion, ear pain and tinnitus.   Eyes: Negative.  Negative for blurred vision and double vision.  Respiratory: Negative.  Negative for cough, sputum production and shortness of breath.   Cardiovascular: Negative.  Negative for chest pain, palpitations and leg swelling.  Gastrointestinal: Negative.  Negative for abdominal pain, constipation, diarrhea, nausea and vomiting.  Genitourinary: Negative for dysuria, frequency and urgency.  Musculoskeletal: Positive for joint pain. Negative for back pain and falls.       Left arm swelling  Skin: Negative.  Negative for rash.  Neurological: Negative.  Negative for weakness and headaches.  Endo/Heme/Allergies: Negative.  Does not bruise/bleed easily.  Psychiatric/Behavioral: Negative.  Negative for depression. The patient is not nervous/anxious and does not have insomnia.      PAST MEDICAL & SURGICAL HISTORY:  Past Medical History:  Diagnosis Date  . Allergy   . Breast cancer ([place])   . Cancer ([place]) 06/15/2017   5.1 cm, T3,N1 (clinical): ER/ PR positive, Her 2 neu not overexpressed, High Ki 67. Neuoadjuvant chemotherapy.   . Depression   . Diabetes mellitus without  complication ([place]) 1610  . Endometriosis   . Family history of breast cancer   . Headache    migraines  . Hyperlipidemia   . Ovarian mass   . Personal history of chemotherapy   . Personal history of radiation therapy   . Pneumonia    2018   Past Surgical History:  Procedure Laterality Date  . AXILLARY LYMPH NODE BIOPSY Left 07/14/2017   Procedure: INSERTION GEL MARK CLIP LEFT AXILLA;  Surgeon: Robert Bellow, MD;  Location: [facility] ORS;  Service: General;  Laterality: Left;  . BREAST BIOPSY Left    Dr Orlene Och BREAST METASTATIC CARCINOMA  . BREAST LUMPECTOMY Left 01/12/2018  . OOPHORECTOMY    . PARTIAL MASTECTOMY WITH NEEDLE LOCALIZATION Left 01/12/2018   Procedure: PARTIAL MASTECTOMY WITH NEEDLE LOCALIZATION;  Surgeon: Robert Bellow, MD;  Location: [facility] ORS;  Service: General;  Laterality: Left;  . PORTACATH PLACEMENT Right 07/14/2017   Procedure: INSERTION PORT-A-CATH;  Surgeon: Robert Bellow, MD;  Location: [facility] ORS;  Service: General;  Laterality: Right;  . SENTINEL NODE BIOPSY Left 01/12/2018   Procedure: SENTINEL NODE BIOPSY;  Surgeon: Robert Bellow, MD;  Location: [facility] ORS;  Service: General;  Laterality: Left;  . TUBAL LIGATION      SOCIAL HISTORY:  Current everyday smoker   CURRENT MEDICATIONS:  Current Outpatient Medications on File Prior to Visit  Medication Sig Dispense Refill  . Alpha-Lipoic Acid 600 MG CAPS Take 1 capsule (600 mg total) by mouth daily. 30 capsule 2  . anastrozole (ARIMIDEX) 1 MG tablet  Take 1 tablet (1 mg total) by mouth daily. 90 tablet 3  . aspirin-acetaminophen-caffeine (EXCEDRIN MIGRAINE) 250-250-65 MG tablet Take 2 tablets by mouth daily as needed for headache.     [name] atorvastatin (LIPITOR) 20 MG tablet Take 1 tablet (20 mg total) by mouth daily. 90 tablet 3  . canagliflozin (INVOKANA) 300 MG TABS tablet Take 300 mg by mouth daily before breakfast.    . Dulaglutide (TRULICITY) 3.25 QD/8.2ME SOPN Inject 0.75 mg into the skin  once a week. 6 mL 4  . DULoxetine (CYMBALTA) 60 MG capsule Take 60 mg by mouth daily.    [name] glucose blood (ONE TOUCH ULTRA TEST) test strip Use up to 4 times/day 100 each 12  . goserelin (ZOLADEX) 3.6 MG injection Inject 3.6 mg into the skin every 28 (twenty-eight) days.    . Insulin Glargine (BASAGLAR KWIKPEN) 100 UNIT/ML Inject 15 Units into the skin at bedtime. 9 mL 4  . Insulin Pen Needle 31G X 8 MM MISC Use daily to administer insulin 100 each 12  . oxyCODONE (OXY IR/ROXICODONE) 5 MG immediate release tablet Take 1 tablet (5 mg total) by mouth 2 (two) times daily as needed for severe pain. Must last 30 days 60 tablet 0  . [START ON 01/06/2020] oxyCODONE (OXY IR/ROXICODONE) 5 MG immediate release tablet Take 1 tablet (5 mg total) by mouth 2 (two) times daily as needed for severe pain. Must last 30 days 60 tablet 0  . pregabalin (LYRICA) 150 MG capsule Take 1 capsule (150 mg total) by mouth 2 (two) times daily. 60 capsule 5  . sertraline (ZOLOFT) 50 MG tablet Take 50 mg by mouth every morning.    [name] oxyCODONE (OXY IR/ROXICODONE) 5 MG immediate release tablet Take 1 tablet (5 mg total) by mouth 2 (two) times daily as needed for severe pain. Must last 30 days 60 tablet 0   Current Facility-Administered Medications on File Prior to Visit  Medication Dose Route Frequency Provider Last Rate Last Admin  . heparin lock flush 100 unit/mL  500 Units Intravenous Once Corcoran, Melissa C, MD      . sodium chloride flush (NS) 0.9 % injection 10 mL  10 mL Intravenous Once Lequita Asal, MD        ALLERGIES:  Allergies  Allergen Reactions  . Aspirin Nausea And Vomiting    PHYSICAL EXAM:  Physical Exam Constitutional:      Appearance: Normal appearance.  HENT:     Head: Normocephalic and atraumatic.  Eyes:     Pupils: Pupils are equal, round, and reactive to light.  Cardiovascular:     Rate and Rhythm: Normal rate and regular rhythm.     Heart sounds: Normal heart sounds. No murmur heard.     Pulmonary:     Effort: Pulmonary effort is normal.     Breath sounds: Normal breath sounds. No wheezing.  Abdominal:     General: Bowel sounds are normal. There is no distension.     Palpations: Abdomen is soft.     Tenderness: There is no abdominal tenderness.  Musculoskeletal:        General: Normal range of motion.     Cervical back: Normal range of motion.  Skin:    General: Skin is warm and dry.     Findings: No rash.  Neurological:     Mental Status: She is alert and oriented to person, place, and time.  Psychiatric:        Judgment: Judgment normal.  LABORATORY DATA:  Lab Results  Component Value Date   WBC 7.5 10/21/2019   HGB 14.3 10/21/2019   HCT 42.6 10/21/2019   MCV 93.2 10/21/2019   PLT 214 10/21/2019      Chemistry      Component Value Date/Time   NA 138 10/21/2019 1316   NA 138 10/18/2019 1418   K 3.6 10/21/2019 1316   CL 103 10/21/2019 1316   CO2 26 10/21/2019 1316   BUN 13 10/21/2019 1316   BUN 14 10/18/2019 1418   CREATININE 0.60 10/21/2019 1316      Component Value Date/Time   CALCIUM 8.7 (L) 10/21/2019 1316   ALKPHOS 80 10/21/2019 1316   AST 14 (L) 10/21/2019 1316   ALT 11 10/21/2019 1316   BILITOT 0.6 10/21/2019 1316   BILITOT 0.3 03/06/2019 1613        DIAGNOSTIC IMAGING:  Mammogram on 09/09/2019-  IMPRESSION: No mammographic evidence of malignancy.  RECOMMENDATION: Annual diagnostic mammography.  I have discussed the findings and recommendations with the patient. If applicable, a reminder letter will be sent to the patient regarding the next appointment.  BI-RADS CATEGORY  2: Benign.  ASSESSMENT & PLAN:  [name] is a pleasant 54 y.o. with past medical history significant for left ER/PR positive HER-2/neu negative breast cancer diagnosed in October 2019 who received chemotherapy with [facility] and single agent Taxol along with radiation which she completed in April 2020.  She started anastrozole in May 2020. Patient  presents to survivorship clinic today for survivorship care plan visit and to address any acute survivorship concerns since completing treatment.    1. History of left ER/PR positive HER-2/neu negative breast cancer: Clinically, she is without evidence of disease recurrence based on physical exam/diagnostic imaging.  Today, she received a copy of her survivorship care plan (SCP) document, which was reviewed with her in detail.  The SCP details her cancer treatment history and potential late/long-term side effects of those treatments.  We discussed the follow-up schedule she can anticipate with interval imaging for surveillance of her cancer.  I have also shared a copy of her treatment summary/SCP with her PCP.  [name] will return to the survivorship clinic as needed; she will return to [place] at [facility] for surveillance visit with Dr. Rogue Bussing next month.  2. Problem at visit:   1.  Joint pain in her hands elbows, hips and knees: Unclear if aromatase inhibitor induced or residual from treatments per patient.  Unclear of start date.  She has been on anastrozole now for over a year and symptoms have been present ever since.  Spoke with Dr. Rogue Bussing who recommends stopping anastrozole at this point and he will see her back next month for follow-up.   2.  Swelling of left arm: She is status post radiation, lumpectomy with 5 lymph nodes removed from her right axilla.  She will be meeting with Gwenette Greet today to assess and get measurements.  Written prescription sent to [facility] for an over-the-counter compression sleeve and glove for left upper extremity stage I lymphedema.  Patient is to wear while performing high risk activities.  She works at [employer] and moves heavy objects.  She will have follow-up with Gwenette Greet in the next couple weeks.  Prescription faxed over.  3.  She is also in need of a DEXA scan.  Previously tried to set up and unfortunately patient missed phone call.   She has been on aromatase inhibitor for over a year now.  We will get that set up for her.  Recommend calcium and vitamin D in the meantime.  3. Smoking cessation: I commended Ms. White's continued efforts to remain tobacco-free.  We discussed that one of the most important risk reduction strategies in preventing cancer recurrence in lung cancer patients is smoking cessation.  He is committed to abstaining from tobacco.  4. Physical activity/Healthy eating: Getting adequate physical activity and maintaining a healthy diet as a cancer survivor is important for overall wellness and reduces the risk of cancer recurrence. We discussed the CARE program which is a fitness program that is offered to cancer survivors free of charge.  We also reviewed the American Cancer Society's booklet with recommendations for nutrition and physical activity.    4. Health promotion/Cancer screening:  [name] is reportedly up-to-date on her colonoscopy, pap smear, PSA tests, skin screenings, and vaccinations.  I encouraged her to talk with his PCP about arranging appropriate cancer screening tests, as appropriate.   5. Support services/Counseling: [name] was seen today in in effort to address both the physical and social concerns of our cancer survivors at [facility]. It is not uncommon for this period of the patient's cancer care trajectory to be one of many emotions and stressors.  I provided support today through active listening, validation of concerns, and expressive supportive counseling.  [name] was encouraged to take advantage of our support services programs and support groups to better cope in her new life as a cancer survivor after completing anti-cancer treatment.   NCCN Guidelines and Recommendations: Breast Cancer Follow-Up Schedule & Surveillance Recommendations Medical History & Physical Exam with your oncologist Every 4-6 months for the first 5 years. These visits will be with  either your medical oncologist or your breast surgeon.   Medical History & Physical Exam with your oncologist Annually after 5 years. These visits may be with your medical oncologist, surgeon, or PCP after 5 years. We also offer a [place] [facility] at the cancer center for extended surveillance.  Mammogram This will most likely be done when your next annual mammogram is due.  We like to wait, however at least 6 months after radiation, so it may be slightly longer in some cases.     Pelvic Exam Due annually, if you are taking Tamoxifen as part of your anti-estrogen therapy   Bone Density Scan (DEXA) Due every 2 years while on aromatase inhibitor therapy, history of osteopenia/osteoporosis, or in post-menopausal women.   General Health & Cancer Screening Recommendations   Physical with your PCP At least annually.  This visit should include labs as indicated by your history and lifestyle.    Colon Cancer Screening  Beginning at age 64; may be done sooner, if clinically indicated.  Generally, a colonoscopy is due every 10 years (unless prior exam warrants more frequent testing).   Pap Smear Due every 3-5 years depending on type of testing done.  Discuss with your PCP or Gynecology Specialist.      Lung Cancer Screening Recommended in people who have a 30 pack year tobacco history, are between ages 61 and 76, and who smoke now, or have quit within the past 15 years.   Skin Cancer Screening and Prevention You should have your skin examined annually either by PCP or dermatology.  Use sunscreen of at least SPF 30 and reapply per label directions, avoid tanning beds, and avoid direct sunlight during peak hours.    Vaccinations (Flu, Shingles, Pneumonia,  TDaP, etc.)  Discuss with your PCP     Diet & Exercise Eat plenty of fresh fruits & vegetables.  Exercise daily per your doctor's orders. Exercise can reduce the risk of some cancers.  Tobacco & Alcohol If you use tobacco products, you should stop.  Let us know how we can help you!  Limit how much alcohol you drink, if you drink at all.    Resources, Interventions, and Education:        Survivors of cancer often report some of the following needs or concerns after they have completed treatment.  Below are some suggested interventions and resources to help guide you.  Just because your cancer treatment has ended, it does not mean that we stop helping you manage your needs or concerns.  Please let us know how we can best help you in your new life post-cancer and your return to health and wellness!          Common Needs/Concerns Suggested intervention(s)  Fatigue . Regular physical activity - walking 20 minutes daily . Pure ground root ginseng, 1043m with breakfast and 10049mwith lunch may help . Evaluation for hypothyroidism, anemia, or depression  Memory problems and/or confusion . About 70% of cancer patients have some degree of cognitive dysfunction (meaning memory problems, trouble concentrating, trouble finding the right word, etc.) after treatment.  This usually gets better over time.  . Some patients may need evaluation for sleep disturbances contributing to memory problems or depression.   Chemotherapy Induced Peripheral Neuropathy . Consider pharmacotherapy with Gabapentin, or Duloxetine . Referral to Physical Therapy: Massage, TENS, balance re-education, strengthening. . Consider acupuncture.  Bone health/osteoporosis risk  Bone densitometry every 1 to 2 years after initiation of aromatase inhibitor (if applicable) and/or at age 7858r older  Consider Vitamin D testing   Calcium intake of 1200 to 150074mer day  Vitamin D supplementation with 1000 IU per day  Weight-bearing exercise  Avoidance of smoking/smoking cessation counseling  Bisphosphonate, if indicated per bone densitometry  Depression/General Anxiety  Consider counseling and/or initiation of pharmacotherapy  Referral to Social Worker   Referral to [facility] [phone] details.  Cancer Transitions class through the [place] [phone] details.  Other resources: AmeNurse, learning disabilityg, Call the [facility] [phone] additional resources. . Consider participating in art therapy for relaxation.  Classes offered monthly may include adult coloring, card making, painting and other craft classes.  . Consider talking to a counselor or Clinical Chaplain available through the cancer center.   . Consult with your Primary Care Provider or Oncologist to see if medication might be a good option.  Hot flashes  You should NOT take any hormone (estrogen-containing) supplements, products, medications, or creams without first discussing them with your oncologist.  Depending on the type of breast cancer you had, these hormone-containing products can sometimes increase your risk of the cancer returning.   Some antidepressants have been shown to help with hot flashes.   A drug called, gabapentin, has also been shown to offer some women relief.   Acupuncture may help with hot flashes  Some things you can try for relief:   Avoid caffeine and hot drinks  Avoid hot/spicy foods  Keep your home/bedroom cool and keep an ice pack under your pillow  Dress in layers  Quit smoking (if you smoke)  Reduce alcohol use (if you drink)  For more information about hot flashes, you can visit:  ForexFest.no.html  Insomnia or trouble sleeping  Practice good sleep habits  Discuss treatments of hot flashes  Consider treatment for anxiety  Read this from the [place] http://www.cancer.org/treatment/treatmentsandsideeffects/physicalsideeffects/fatigue/fatigueinpeoplewithcancer/fatigue-in-people-with-cancer-treating-fatigue  Weight gain or overweight  ( BMI over 25.0 m  or weight gain of >15 lbs)   Dietary counseling  with a registered dietician  Referral to a weight management support group (e.g. Weight Watchers, Overeaters Anonymous) . If your BMI is greater than 25 or you have gained more than 15 pounds you should work on weight loss. . Attend a healthy cooking class when offered at the cancer center, call [phone]. . Consider a weight management support group such as Weight Watchers.  . See if you qualify for the C.A.R.E. program.  It includes exercise sessions offered Tuesdays & Thursdays 12:15 -1:15. Ask nurse for a doctor referral if you have not been in the program and are seeing doctor at least once every six months or call [phone] for the [facility].   Change in your eating habits, your taste, or your smell . Choose foods with tart flavors like lemon wedges, lemonade, citrus fruits, vinegar, and pickled foods.  . Season foods with herbs/spices/or other seasonings like onion, garlic, chili powder, barbecue sauce, mustard, ketchup, mint, etc.  . If meats taste strange, try marinating in citrus, herbal or vinegar-based dressings or sauces.  . If certain foods or drinks smell unpleasant while cooking, choose foods that do not need to be cooked, like cold sandwiches, crackers and cheese, yogurt and fruit, or cold cereal.  . Keep your mouth clean and healthy by rinsing and brushing your teeth after meals and before bed.   Lymphedema Referral to lymphedema therapy specialist   Weight training  Maintenance of healthy weight: people with a BMI of greater than or equal to 25 at the time of diagnosis have a more than 3 times higher risk of lymphedema . If you are at risk, consider a visit with our Lymphedema specialist to learn how to reduce risk post treatment.  Ask your oncologist for referral. . Maintain a healthy weight. . Some patients may need a preventative compression sleeve to wear when doing repetitive motions, flying or exercising to reduce risk.  Discuss with Lymphedema Specialist or  Oncologist.   Decreased range of motion   Referral to Physical Therapy  Consider taking yoga or Tai Chi classes at the [facility].  Call [phone] for a schedule.  Joint aches and pains  Symptom management with medications  Consideration of switch to another aromatase inhibitor, as some can cause joint aches/pain  Consider joining [place] or Yoga classes at [facility]. Call [phone] for a schedule.   Pain with intercourse/vaginal dryness  Recommendation for vaginal lubricant (Astroglide) or moisturizer (Replens, 1x every 3 days)  Consideration of low dose vaginal estrogen cream (Estring, Premarin, Estrace). You should discuss this option with your medical oncologist before trying any medications or creams containing estrogen.  Consideration of [place]  Referral to Pelvic Floor Rehabilitation Therapist:[phone]  Intimacy and sexuality  Evaluation and treatment for anxiety, depression, as appropriate  Address body image concerns, such as breast asymmetry or absence, by obtaining a prosthesis. Ask your doctor for a prescription.   Attend the American Cancer Society's Look Good.Feel Better program. Call [phone] for a schedule of when this event is offered.   Consider attending support groups.. Call [phone] for details.  Referral to Pelvic therapy for pain, vaginal narrowing, and vaginal dryness  Ask for  a list of approved lubricants and vaginal moisturizers  Attend a Cancer Transitions class at [facility]. Call [phone] for details.  Consideration of low dose vaginal estrogen cream (Estring, Premarin, Estrace). You should discuss this option with your medical oncologist before trying any medications or creams containing estrogen.  Marital/partner/family relationships  Referral to Forensic psychologist Groups at the [employer]   Employment, health insurance, and/or finances  Referral to Education officer, museum  Concerns regarding  spirituality, faith, coping, relating to God, loss of faith, facing my mortality, and/or loss of my sense of purpose  Referral to Chaplain   Referral to [employer] activities: www.hirschwellnessnetwork.org  Support Groups at the [facility]. Call [phone] for details.  Returning to an exercise program  Consider joining the [facility], a 12-week program that meets twice a week for 90 minutes using traditional exercise methods to ease you back into fitness and help you maintain a healthy weight.   This program is FREE for you.  Offered at several local [place].    Participate in our Cancer Transitions program or our CARE program.  Call [phone] or [phone]  Are you interested in staying informed of the [facility]'s breast cancer programs?   Consider volunteer opportunities with the [facility].   Consider becoming a mentor to help other cancer survivors at [facility] at [facility] during their cancer experience.  Contact Magdalene Patricia RN at [phone] or [email or web address]  Additional resources for cancer survivors  ASCO, patient education www.cancer.net  [employer] www.livestrong.org  The [employer] for [employer].foundationforwomenscancer.[place] About Herbs website: TheyParty.dk  The Leukemia and Lymphoma Society: PreviewPal.pl  American Cancer Society: http://davis-dillon.net/  American Institute for Cancer Research: FailFactory.se  Cancer Care support services: www.cancercare.org  Centers for Disease Control and Prevention, survivorship information: BasketballBiz.fr  [place]: www.survivorship.cancer.gov  National Comprehensive Cancer Network: NetOpenings.si  MedlinePlus:  http://www.parks.biz/.cancers.html  Cancer and Careers: www.cancerandcarrers.org/en  [employer] for [employer]) Employment rights: www.canceradvocacy.org/resources/employment-rights/  [employer] information: https://www.thenccs.org/health-insurance/      Dispo:  -RTC for follow-up with Dr. Rogue Bussing next month. -RTC for follow-up with radiation oncology as scheduled -RTC on 01/15/2020 with [facility] regarding lymphedema.  Greater than 50% was spent in counseling and coordination of care with this patient including but not limited to discussion of the relevant topics above (See A&P) including, but not limited to diagnosis and management of acute and chronic medical conditions.   Rulon Abide, AGNP-C [facility] [place] (office) 12/25/19 12:26 PM

## 2019-12-25 NOTE — Progress Notes (Signed)
Here in clinic for survivorship visit today. Also has appt with Lymphedema clinic today.

## 2019-12-25 NOTE — Therapy (Signed)
[facility] [address] [place], [place], [place], [address] Phone: [phone]   Fax:  [phone]  Occupational Therapy Screen  Patient Details  Name: Carolyn Roy MRN: [id number] Date of Birth: 02-11-1965 No data recorded  Encounter Date: 12/25/2019   OT End of Session - 12/25/19 1212    Visit Number 0           Past Medical History:  Diagnosis Date  . Allergy   . Breast cancer ([place])   . Cancer ([place]) 06/15/2017   5.1 cm, T3,N1 (clinical): ER/ PR positive, Her 2 neu not overexpressed, High Ki 67. Neuoadjuvant chemotherapy.   . Depression   . Diabetes mellitus without complication ([place]) 7680  . Endometriosis   . Family history of breast cancer   . Headache    migraines  . Hyperlipidemia   . Ovarian mass   . Personal history of chemotherapy   . Personal history of radiation therapy   . Pneumonia    2018    Past Surgical History:  Procedure Laterality Date  . AXILLARY LYMPH NODE BIOPSY Left 07/14/2017   Procedure: INSERTION GEL MARK CLIP LEFT AXILLA;  Surgeon: Robert Bellow, MD;  Location: [facility] ORS;  Service: General;  Laterality: Left;  . BREAST BIOPSY Left    Dr Orlene Och BREAST METASTATIC CARCINOMA  . BREAST LUMPECTOMY Left 01/12/2018  . OOPHORECTOMY    . PARTIAL MASTECTOMY WITH NEEDLE LOCALIZATION Left 01/12/2018   Procedure: PARTIAL MASTECTOMY WITH NEEDLE LOCALIZATION;  Surgeon: Robert Bellow, MD;  Location: [facility] ORS;  Service: General;  Laterality: Left;  . PORTACATH PLACEMENT Right 07/14/2017   Procedure: INSERTION PORT-A-CATH;  Surgeon: Robert Bellow, MD;  Location: [facility] ORS;  Service: General;  Laterality: Right;  . SENTINEL NODE BIOPSY Left 01/12/2018   Procedure: SENTINEL NODE BIOPSY;  Surgeon: Robert Bellow, MD;  Location: [facility] ORS;  Service: General;  Laterality: Left;  . TUBAL LIGATION      There were no vitals filed for this visit.   Subjective Assessment - 12/25/19  1210    Subjective  My L shouilder are always stiff in the am -we do stretch 2 x day at work -and then I stretch my arm - but it stays tight under my arm pit and get sore as the day goes- my arm feels heavier as the day goes- I do pick up and move heavy objects at work    Currently in Pain? Yes    Pain Score 5     Pain Location Generalized    Pain Orientation --   multiple joint pain   Pain Descriptors / Indicators Aching               LYMPHEDEMA/ONCOLOGY QUESTIONNAIRE - 12/25/19 0001      Right Upper Extremity Lymphedema   15 cm Proximal to Olecranon Process 31.5 cm    10 cm Proximal to Olecranon Process 29.8 cm    Olecranon Process 25 cm    15 cm Proximal to Ulnar Styloid Process 24.8 cm    10 cm Proximal to Ulnar Styloid Process 22 cm    Just Proximal to Ulnar Styloid Process 16.8 cm    Across Hand at [employer] 19.5 cm    At [place] of 2nd Digit 6.4 cm    At [facility] of Thumb 6 cm      Left Upper Extremity Lymphedema   15 cm Proximal to Olecranon Process 34 cm  10 cm Proximal to Olecranon Process 31 cm    Olecranon Process 25.4 cm    15 cm Proximal to Ulnar Styloid Process 24.5 cm    10 cm Proximal to Ulnar Styloid Process 23 cm    Just Proximal to Ulnar Styloid Process 17.5 cm    Across Hand at [employer] 19 cm    At [place] of 2nd Digit 6.2 cm    At [facility] of [place]umb 6 cm           Copied out of last note of Dr B:   Oncology History Overview Note   # MAY 2019-  clinical stage IIIA (T3N1Mx) left breast cancers/p biopsy on 06/14/2017. -Pathologyrevealed grade III invasive ductal carcinoma. -Axillary FNArevealed malignant cells c/w metastatic carcinoma. Tumor was ER + (90%), PR + (30%), Her2/neu - and Ki67 70%. CA27.29was 7.8 on 06/14/2017.  # She received 4 cycles of ACwith Neulasta support (07/20/2017 - 08/31/2017).;  neoadjuvant Taxolon 09/14/2017.  #DEC 2019- Lumpectomy/sentinel lymph node biopsy [Dr.[name]]-complete pathologic response  # s/p RT  [delayed sec to wound infection; Dr.[name]] finished RT [4/12]  # April 14th 2020- START TAM; stopped in mid-May secondary intolerance [severe migraines].  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 Secord/Berchuck]-negative for malignancy/ # DM-2- poorly controlled.   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  # PAP SMEAR- RECOMMENDED 2022- summer        Pt works for the past 9 years at [employer] -and has 4 large dogs - take care of them , do own house work but no yard work. She is R hand dominant  She had 5 ln removed -and lumpectomy incision got infected and that delayed her start of radiation Pt report her shoulder are stiff in the morning - they do stretches at work 2 x day that helps Pt tender under axilla and over axilla scar - scar on breast only 1/10 Hyper sensitive under arm and axilla Pt ed on desentitization and scar massage  Ed on HEP for AAROM for shoulder flexion and ABD on wall - 10 reps hold 3-5 sec And Ext rotation - 10 reps hold 3 -5 sec  Stretch should be less than 2/10   Pt show increase circumference in L UE - non dominant UE - with upper arm increase by 2.5 cm , forearm  1 cm and wrist 0.7 cm  Pt report her arm feels heavier as the day goes Pt ed on lymphedema info and precautions and prevention/control of lymphedema-( pt had what appear dog scratches on L forearm) Pt stage 1 lymphedema Would recommend over the counter sleeve and glove - to use at work - and for high risk activities Pt verbalize understanding and will follow up with me in 3 wks                               Visit Diagnosis: Lymphedema, not elsewhere classified    Problem List Patient Active Problem List   Diagnosis Date Noted  . Uncomplicated opioid dependence ([place]) 09/18/2019  . Numbness  and tingling of upper extremity (C6/C7 dermatomes) (Right) 04/30/2019  . Personal history of breast cancer 04/30/2019  . Cervicalgia 04/30/2019  . Cervical radiculitis (Right) 04/30/2019  . Insomnia due to drug ([place]) 12/21/2018  . Spondylosis without myelopathy or radiculopathy, lumbosacral region 08/16/2018  .  DDD (degenerative disc disease), lumbosacral 08/16/2018  . Abnormal MRI, lumbar spine (04/20/2018) 05/09/2018  . Lumbar facet arthropathy 05/09/2018  . Chronic low back pain (Bilateral (L>R) w/o sciatica 03/14/2018  . Chronic hip pain (Left) 03/14/2018  . Lumbar facet syndrome (Bilateral) (L>R) 03/14/2018  . Idiopathic scoliosis 03/14/2018  . Chronic sacroiliac joint pain (Left) 03/14/2018  . Chronic pain of knee (Right) 03/14/2018  . Chronic upper extremity pain [facility] Area of Pain) (Bilateral) (R>L) 02/14/2018  . Cancer-related pain 02/14/2018  . Vitamin D deficiency 02/14/2018  . Osteoarthritis of knee (Right) 02/14/2018  . Type 2 diabetes mellitus with hyperglycemia, with long-term current use of insulin ([place]) 01/23/2018  . Chronic feet pain (Primary Area of Pain) (Bilateral) (R>L) 01/22/2018  . Chronic knee pain (Secondary Area of Pain) (Bilateral) (R>L) 01/22/2018  . Chronic hand pain [facility] Area of Pain) (Bilateral) (R>L) 01/22/2018  . Chronic pain syndrome 01/22/2018  . Disorder of skeletal system 01/22/2018  . Chronic hip pain (Fourth Area of Pain) (Bilateral) (R>L) 01/22/2018  . Malignant neoplasm of lower-outer quadrant of left breast of female, estrogen receptor positive ([place]) 11/08/2017  . Chemotherapy-induced neuropathy ([place]) 10/19/2017  . Goals of care, counseling/discussion 07/20/2017  . Encounter for antineoplastic chemotherapy 07/20/2017  . Cervical polyp 07/05/2017  . Family history of breast cancer   . Cancer of midline of breast, left ([place]) 06/15/2017  . Stage 1 mild COPD by GOLD classification ([place]) 03/31/2017  . Hyperlipidemia associated with type  2 diabetes mellitus ([place]) 03/03/2017  . Nicotine dependence, cigarettes, w unsp disorders 11/15/2016  . Chronic fatigue 03/30/2015  . Severe depression ([place]) 03/30/2015    Rosalyn Gess OTR/L,CLT 12/25/2019, 12:12 PM  [facility] [address] [place], [place], [place], [address] Phone: [phone]   Fax:  [phone]  Name: Carolyn Roy MRN: [id number] Date of Birth: 1965-08-23

## 2019-12-27 ENCOUNTER — Encounter: Payer: Self-pay | Admitting: Gastroenterology

## 2019-12-27 ENCOUNTER — Encounter
Admission: RE | Disposition: A | Discharge status: Home / Self Care | Payer: Self-pay | Source: Home / Self Care | Attending: Gastroenterology

## 2019-12-27 ENCOUNTER — Ambulatory Visit: Payer: No Typology Code available for payment source | Admitting: Anesthesiology

## 2019-12-27 ENCOUNTER — Other Ambulatory Visit: Payer: Self-pay

## 2019-12-27 ENCOUNTER — Ambulatory Visit
Admission: RE | Admit: 2019-12-27 | Discharge: 2019-12-27 | Disposition: A | Discharge status: Home / Self Care | Payer: No Typology Code available for payment source | Attending: Gastroenterology | Admitting: Gastroenterology

## 2019-12-27 DIAGNOSIS — Z794 Long term (current) use of insulin: Secondary | ICD-10-CM | POA: Diagnosis not present

## 2019-12-27 DIAGNOSIS — Z803 Family history of malignant neoplasm of breast: Secondary | ICD-10-CM | POA: Insufficient documentation

## 2019-12-27 DIAGNOSIS — K5730 Diverticulosis of large intestine without perforation or abscess without bleeding: Secondary | ICD-10-CM | POA: Diagnosis not present

## 2019-12-27 DIAGNOSIS — K635 Polyp of colon: Secondary | ICD-10-CM | POA: Diagnosis not present

## 2019-12-27 DIAGNOSIS — F17210 Nicotine dependence, cigarettes, uncomplicated: Secondary | ICD-10-CM | POA: Insufficient documentation

## 2019-12-27 DIAGNOSIS — Z800 Family history of malignant neoplasm of digestive organs: Secondary | ICD-10-CM | POA: Insufficient documentation

## 2019-12-27 DIAGNOSIS — K633 Ulcer of intestine: Secondary | ICD-10-CM | POA: Diagnosis not present

## 2019-12-27 DIAGNOSIS — K648 Other hemorrhoids: Secondary | ICD-10-CM | POA: Insufficient documentation

## 2019-12-27 DIAGNOSIS — Z1211 Encounter for screening for malignant neoplasm of colon: Secondary | ICD-10-CM

## 2019-12-27 DIAGNOSIS — Z923 Personal history of irradiation: Secondary | ICD-10-CM | POA: Insufficient documentation

## 2019-12-27 DIAGNOSIS — Z853 Personal history of malignant neoplasm of breast: Secondary | ICD-10-CM | POA: Insufficient documentation

## 2019-12-27 DIAGNOSIS — Z9221 Personal history of antineoplastic chemotherapy: Secondary | ICD-10-CM | POA: Insufficient documentation

## 2019-12-27 DIAGNOSIS — Z886 Allergy status to analgesic agent status: Secondary | ICD-10-CM | POA: Diagnosis not present

## 2019-12-27 DIAGNOSIS — Z7982 Long term (current) use of aspirin: Secondary | ICD-10-CM | POA: Diagnosis not present

## 2019-12-27 DIAGNOSIS — Z79899 Other long term (current) drug therapy: Secondary | ICD-10-CM | POA: Insufficient documentation

## 2019-12-27 HISTORY — PX: COLONOSCOPY WITH PROPOFOL: SHX5780

## 2019-12-27 LAB — GLUCOSE, CAPILLARY: Glucose-Capillary: 126 mg/dL — ABNORMAL HIGH (ref 70–99)

## 2019-12-27 SURGERY — COLONOSCOPY WITH PROPOFOL
Anesthesia: General

## 2019-12-27 MED ORDER — PROPOFOL 500 MG/50ML IV EMUL
INTRAVENOUS | Status: DC | PRN
  Administered 2019-12-27: 130 ug/kg/min via INTRAVENOUS

## 2019-12-27 MED ORDER — LIDOCAINE HCL (CARDIAC) PF 100 MG/5ML IV SOSY
PREFILLED_SYRINGE | INTRAVENOUS | Status: DC | PRN
  Administered 2019-12-27: 40 mg via INTRAVENOUS

## 2019-12-27 MED ORDER — PROPOFOL 10 MG/ML IV BOLUS
INTRAVENOUS | Status: DC | PRN
  Administered 2019-12-27: 80 mg via INTRAVENOUS

## 2019-12-27 MED ORDER — LIDOCAINE HCL (PF) 2 % IJ SOLN
INTRAMUSCULAR | Status: AC
  Filled 2019-12-27: qty 5

## 2019-12-27 MED ORDER — PROPOFOL 500 MG/50ML IV EMUL
INTRAVENOUS | Status: AC
  Filled 2019-12-27: qty 50

## 2019-12-27 MED ORDER — SODIUM CHLORIDE 0.9 % IV SOLN: INTRAVENOUS | Status: DC

## 2019-12-27 NOTE — H&P (Signed)
Carolyn Antigua, MD [address], [place], [place], [place], [address] [address], [place], [place], [place], [address] Phone: [phone]  Fax: [phone]  Primary Care Physician:  Guadalupe Maple, MD   Pre-Procedure History & Physical: HPI:  Carolyn Roy is a 54 y.o. female is here for a colonoscopy.   Past Medical History:  Diagnosis Date  . Allergy   . Breast cancer ([place])   . Cancer ([place]) 06/15/2017   5.1 cm, T3,N1 (clinical): ER/ PR positive, Her 2 neu not overexpressed, High Ki 67. Neuoadjuvant chemotherapy.   . Depression   . Diabetes mellitus without complication ([place]) 7425  . Endometriosis   . Family history of breast cancer   . Headache    migraines  . Hyperlipidemia   . Ovarian mass   . Personal history of chemotherapy   . Personal history of radiation therapy   . Pneumonia    2018    Past Surgical History:  Procedure Laterality Date  . AXILLARY LYMPH NODE BIOPSY Left 07/14/2017   Procedure: INSERTION GEL MARK CLIP LEFT AXILLA;  Surgeon: Robert Bellow, MD;  Location: [facility] ORS;  Service: General;  Laterality: Left;  . BREAST BIOPSY Left    Dr Orlene Och BREAST METASTATIC CARCINOMA  . BREAST LUMPECTOMY Left 01/12/2018  . OOPHORECTOMY    . PARTIAL MASTECTOMY WITH NEEDLE LOCALIZATION Left 01/12/2018   Procedure: PARTIAL MASTECTOMY WITH NEEDLE LOCALIZATION;  Surgeon: Robert Bellow, MD;  Location: [facility] ORS;  Service: General;  Laterality: Left;  . PORTACATH PLACEMENT Right 07/14/2017   Procedure: INSERTION PORT-A-CATH;  Surgeon: Robert Bellow, MD;  Location: [facility] ORS;  Service: General;  Laterality: Right;  . SENTINEL NODE BIOPSY Left 01/12/2018   Procedure: SENTINEL NODE BIOPSY;  Surgeon: Robert Bellow, MD;  Location: [facility] ORS;  Service: General;  Laterality: Left;  . TUBAL LIGATION      Prior to Admission medications   Medication Sig Start Date End Date Taking? Authorizing Provider  anastrozole (ARIMIDEX) 1 MG tablet Take 1  tablet (1 mg total) by mouth daily. 10/21/19  Yes Cammie Sickle, MD  canagliflozin (INVOKANA) 300 MG TABS tablet Take 300 mg by mouth daily before breakfast.    Yes [provider]  Dulaglutide (TRULICITY) 9.56 LO/7.5IE SOPN Inject 0.75 mg into the skin once a week. 10/18/19  Yes Cannady, Jolene T, NP  DULoxetine (CYMBALTA) 60 MG capsule Take 60 mg by mouth daily.   Yes [provider]  glucose blood (ONE TOUCH ULTRA TEST) test strip Use up to 4 times/day 06/01/15  Yes Kathrine Haddock, NP  goserelin (ZOLADEX) 3.6 MG injection Inject 3.6 mg into the skin every 28 (twenty-eight) days.   Yes [provider]  Insulin Glargine (BASAGLAR KWIKPEN) 100 UNIT/ML Inject 15 Units into the skin at bedtime. 10/18/19  Yes Cannady, Henrine Screws T, NP  Insulin Pen Needle 31G X 8 MM MISC Use daily to administer insulin 10/18/19  Yes Cannady, Jolene T, NP  oxyCODONE (OXY IR/ROXICODONE) 5 MG immediate release tablet Take 1 tablet (5 mg total) by mouth 2 (two) times daily as needed for severe pain. Must last 30 days 12/07/19 01/06/20 Yes Gillis Santa, MD  oxyCODONE (OXY IR/ROXICODONE) 5 MG immediate release tablet Take 1 tablet (5 mg total) by mouth 2 (two) times daily as needed for severe pain. Must last 30 days 01/06/20 02/05/20 Yes Gillis Santa, MD  pregabalin (LYRICA) 150 MG capsule Take 1 capsule (150 mg total) by mouth 2 (two) times daily.  11/04/19 05/02/20 Yes Gillis Santa, MD  sertraline (ZOLOFT) 50 MG tablet Take 50 mg by mouth every morning. 08/19/19  Yes [provider]  Alpha-Lipoic Acid 600 MG CAPS Take 1 capsule (600 mg total) by mouth daily. 11/04/19 02/02/20  Gillis Santa, MD  aspirin-acetaminophen-caffeine (EXCEDRIN MIGRAINE) [phone] MG tablet Take 2 tablets by mouth daily as needed for headache.     [provider]  atorvastatin (LIPITOR) 20 MG tablet Take 1 tablet (20 mg total) by mouth daily. 06/05/19   Cannady, Henrine Screws T, NP  oxyCODONE (OXY IR/ROXICODONE) 5 MG  immediate release tablet Take 1 tablet (5 mg total) by mouth 2 (two) times daily as needed for severe pain. Must last 30 days 11/07/19 12/07/19  Gillis Santa, MD    Allergies as of 11/25/2019 - Review Complete 11/25/2019  Allergen Reaction Noted  . Aspirin Nausea And Vomiting 04/14/2015    Family History  Problem Relation Age of Onset  . Colon cancer Mother   . Cancer Mother   . Other Father        No info about father or paternal relatives  . Diabetes Brother   . Pancreatitis Brother   . Prostate cancer Brother 4       currently 84 / maternal half-brother  . Breast cancer Maternal Grandmother 40       deceased 56s  . Colon cancer Maternal Grandmother   . Breast cancer Maternal Aunt 65       currently 23  . Breast cancer Other 75       mother's sister; deceased 86  . Breast cancer Other        mother's sister; age at dx unknown    Social History   Socioeconomic History  . Marital status: Married    Spouse name: Not on file  . Number of children: Not on file  . Years of education: Not on file  . Highest education level: Not on file  Occupational History  . Not on file  Tobacco Use  . Smoking status: Current Every Day Smoker    Packs/day: 1.00    Years: 11.00    Pack years: 11.00    Types: Cigarettes  . Smokeless tobacco: Former Systems developer    Types: Snuff  Vaping Use  . Vaping Use: Never used  Substance and Sexual Activity  . Alcohol use: No    Alcohol/week: 0.0 standard drinks  . Drug use: No  . Sexual activity: Yes  Other Topics Concern  . Not on file  Social History Narrative  . Not on file   Social Determinants of Health   Financial Resource Strain: Low Risk   . Difficulty of Paying Living Expenses: Not very hard  Food Insecurity:   . Worried About Charity fundraiser in the Last Year: Not on file  . Ran Out of Food in the Last Year: Not on file  Transportation Needs:   . Lack of Transportation (Medical): Not on file  . Lack of Transportation  (Non-Medical): Not on file  Physical Activity:   . Days of Exercise per Week: Not on file  . Minutes of Exercise per Session: Not on file  Stress:   . Feeling of Stress : Not on file  Social Connections:   . Frequency of Communication with Friends and Family: Not on file  . Frequency of Social Gatherings with Friends and Family: Not on file  . Attends Religious Services: Not on file  . Active Member of Clubs or Organizations:  Not on file  . Attends Archivist Meetings: Not on file  . Marital Status: Not on file  Intimate Partner Violence:   . Fear of Current or Ex-Partner: Not on file  . Emotionally Abused: Not on file  . Physically Abused: Not on file  . Sexually Abused: Not on file    Review of Systems: See HPI, otherwise negative ROS  Physical Exam: BP 102/66   Pulse 88   Temp (!) 96.1 F (35.6 C) (Temporal)   Resp 20   Ht 5\' 4"  (1.626 m)   Wt 79.4 kg   LMP  (LMP Unknown) Comment: LAST PERIOD IN MAY 2019 WHEN SHE STARTED CHEMO  SpO2 97%   BMI 30.04 kg/m  General:   Alert,  pleasant and cooperative in NAD Head:  Normocephalic and atraumatic. Neck:  Supple; no masses or thyromegaly. Lungs:  Clear throughout to auscultation, normal respiratory effort.    Heart:  +S1, +S2, Regular rate and rhythm, No edema. Abdomen:  Soft, nontender and nondistended. Normal bowel sounds, without guarding, and without rebound.   Neurologic:  Alert and  oriented x4;  grossly normal neurologically.  Impression/Plan: THEIA DEZEEUW is here for a colonoscopy to be performed for family history of colon cancer  Risks, benefits, limitations, and alternatives regarding  colonoscopy have been reviewed with the patient.  Questions have been answered.  All parties agreeable.   Virgel Manifold, MD  12/27/2019, 8:19 AM

## 2019-12-27 NOTE — Transfer of Care (Signed)
Immediate Anesthesia Transfer of Care Note  Patient: Carolyn Roy  Procedure(s) Performed: COLONOSCOPY WITH PROPOFOL (N/A )  Patient Location: PACU and Endoscopy Unit  Anesthesia Type:General  Level of Consciousness: drowsy  Airway & Oxygen Therapy: Patient Spontanous Breathing  Post-op Assessment: Report given to RN and Post -op Vital signs reviewed and stable  Post vital signs: Reviewed and stable  Last Vitals:  Vitals Value Taken Time  BP 107/64 12/27/19 0903  Temp    Pulse 73 12/27/19 0903  Resp 14 12/27/19 0903  SpO2 98 % 12/27/19 0903    Last Pain:  Vitals:   12/27/19 0903  TempSrc:   PainSc: 0-No pain         Complications: No complications documented.

## 2019-12-27 NOTE — Op Note (Addendum)
[facility] Patient Name: Carolyn Roy Procedure Date: 12/27/2019 8:21 AM MRN: [id number] Account #: [id number] Date of Birth: [birth year] Admit Type: Outpatient Age: 54 Room: [facility] ENDO ROOM 2 Gender: Female Note Status: Finalized Procedure:             Colonoscopy Indications:           Screening in patient at increased risk: Family history                         of 1st-degree relative with colorectal cancer Providers:             Adasia Hoar B. Bonna Gains MD, MD Referring MD:          Guadalupe Maple, MD (Referring MD) Medicines:             Monitored Anesthesia Care Complications:         No immediate complications. Procedure:             Pre-Anesthesia Assessment:                        - Prior to the procedure, a History and Physical was                         performed, and patient medications, allergies and                         sensitivities were reviewed. The patient's tolerance                         of previous anesthesia was reviewed.                        - The risks and benefits of the procedure and the                         sedation options and risks were discussed with the                         patient. All questions were answered and informed                         consent was obtained.                        - Patient identification and proposed procedure were                         verified prior to the procedure by the physician, the                         nurse, the anesthetist and the technician. The                         procedure was verified in the pre-procedure area in                         the procedure room in the endoscopy suite.                        -  ASA Grade Assessment: II - A patient with mild                         systemic disease.                        - After reviewing the risks and benefits, the patient                         was deemed in satisfactory condition to undergo the                          procedure.                        After obtaining informed consent, the colonoscope was                         passed under direct vision. Throughout the procedure,                         the patient's blood pressure, pulse, and oxygen                         saturations were monitored continuously. The                         Colonoscope was introduced through the anus and                         advanced to the the cecum, identified by appendiceal                         orifice and ileocecal valve. The colonoscopy was                         performed with ease. The patient tolerated the                         procedure well. The quality of the bowel preparation                         was fair. Findings:      The perianal and digital rectal examinations were normal.      A single (solitary) four mm ulcer was found in the cecum. No bleeding       was present. No stigmata of recent bleeding were seen. Biopsies were       taken with a cold forceps for histology.      A 3 mm polyp was found in the cecum. The polyp was sessile. The polyp       was removed with a cold biopsy forceps. Resection and retrieval were       complete. The polyp was seen adjacent to the cecal ulcer and was placed       in the same jar. Adjacent normal mucosa was also biopsied.      A few diverticula were found in the sigmoid colon.      The exam was otherwise without abnormality.      The rectum, sigmoid colon, descending colon, transverse  colon, ascending       colon and cecum appeared normal.      Non-bleeding internal hemorrhoids were found during retroflexion. The       hemorrhoids were medium-sized. Impression:            - Preparation of the colon was fair.                        - A single (solitary) ulcer in the cecum. Biopsied.                        - One 3 mm polyp in the cecum, removed with a cold                         biopsy forceps. Resected and retrieved.                        -  Diverticulosis in the sigmoid colon.                        - The examination was otherwise normal.                        - The rectum, sigmoid colon, descending colon,                         transverse colon, ascending colon and cecum are normal.                        - Non-bleeding internal hemorrhoids. Recommendation:        - Discharge patient to home.                        - Resume previous diet.                        - Continue present medications.                        - Repeat colonoscopy in 6-12 months, with 2 day prep.                        - Return to primary care physician as previously                         scheduled.                        - The findings and recommendations were discussed with                         the patient.                        - The findings and recommendations were discussed with                         the patient's family.                        - High fiber diet. Procedure Code(s):     ---  Professional ---                        [phone], Colonoscopy, flexible; with biopsy, single or                         multiple Diagnosis Code(s):     --- Professional ---                        Z80.0, Family history of malignant neoplasm of                         digestive organs                        K63.5, Polyp of colon CPT copyright 2019 American Medical Association. All rights reserved. The codes documented in this report are preliminary and upon coder review may  be revised to meet current compliance requirements.  [name], MD Margretta Sidle B. Bonna Gains MD, MD 12/27/2019 9:07:31 AM This report has been signed electronically. Number of Addenda: 0 Note Initiated On: 12/27/2019 8:21 AM Scope Withdrawal Time: 0 hours 16 minutes 9 seconds  Total Procedure Duration: 0 hours 20 minutes 16 seconds  Estimated Blood Loss:  Estimated blood loss: none.      [facility]

## 2019-12-27 NOTE — Anesthesia Postprocedure Evaluation (Signed)
Anesthesia Post Note  Patient: Carolyn Roy  Procedure(s) Performed: COLONOSCOPY WITH PROPOFOL (N/A )  Patient location during evaluation: Endoscopy Anesthesia Type: General Level of consciousness: awake and alert Pain management: pain level controlled Vital Signs Assessment: post-procedure vital signs reviewed and stable Respiratory status: spontaneous breathing, nonlabored ventilation, respiratory function stable and patient connected to nasal cannula oxygen Cardiovascular status: blood pressure returned to baseline and stable Postop Assessment: no apparent nausea or vomiting Anesthetic complications: no   No complications documented.   Last Vitals:  Vitals:   12/27/19 0913 12/27/19 0919  BP: 127/69 138/81  Pulse: 77 (!) 119  Resp: 19 17  Temp: 36.6 C   SpO2: 100% 93%    Last Pain:  Vitals:   12/27/19 0919  TempSrc:   PainSc: 0-No pain                 Martha Clan

## 2019-12-27 NOTE — Anesthesia Preprocedure Evaluation (Signed)
Anesthesia Evaluation  Patient identified by MRN, date of birth, ID band Patient awake    Reviewed: Allergy & Precautions, H&P , NPO status , Patient's Chart, lab work & pertinent test results  History of Anesthesia Complications Negative for: history of anesthetic complications  Airway Mallampati: I  TM Distance: >3 FB     Dental  (+) Edentulous Lower, Edentulous Upper   Pulmonary neg sleep apnea, COPD, Current SmokerPatient did not abstain from smoking.,    breath sounds clear to auscultation       Cardiovascular (-) angina(-) Past MI and (-) Cardiac Stents negative cardio ROS  (-) dysrhythmias  Rhythm:regular Rate:Normal     Neuro/Psych  Headaches, PSYCHIATRIC DISORDERS Depression    GI/Hepatic negative GI ROS, Neg liver ROS,   Endo/Other  diabetes  Renal/GU negative Renal ROS  negative genitourinary   Musculoskeletal   Abdominal   Peds  Hematology negative hematology ROS (+)   Anesthesia Other Findings Past Medical History: No date: Allergy No date: Breast cancer ([place]) 06/15/2017: Cancer ([place])     Comment:  5.1 cm, T3,N1 (clinical): ER/ PR positive, Her 2 neu not              overexpressed, High Ki 67. Neuoadjuvant chemotherapy.  No date: Depression 2017: Diabetes mellitus without complication (HCC) No date: Endometriosis No date: Family history of breast cancer No date: Headache     Comment:  migraines No date: Hyperlipidemia No date: Ovarian mass No date: Personal history of chemotherapy No date: Personal history of radiation therapy No date: Pneumonia     Comment:  2018  Past Surgical History: 07/14/2017: AXILLARY LYMPH NODE BIOPSY; Left     Comment:  Procedure: INSERTION GEL MARK CLIP LEFT AXILLA;                Surgeon: Robert Bellow, MD;  Location: [facility] ORS;                Service: General;  Laterality: Left; No date: BREAST BIOPSY; Left     Comment:  Dr Orlene Och BREAST METASTATIC  CARCINOMA 01/12/2018: BREAST LUMPECTOMY; Left No date: OOPHORECTOMY 01/12/2018: PARTIAL MASTECTOMY WITH NEEDLE LOCALIZATION; Left     Comment:  Procedure: PARTIAL MASTECTOMY WITH NEEDLE LOCALIZATION;               Surgeon: Robert Bellow, MD;  Location: [facility] ORS;                Service: General;  Laterality: Left; 07/14/2017: PORTACATH PLACEMENT; Right     Comment:  Procedure: INSERTION PORT-A-CATH;  Surgeon: Robert Bellow, MD;  Location: [facility] ORS;  Service: General;                Laterality: Right; 01/12/2018: SENTINEL NODE BIOPSY; Left     Comment:  Procedure: SENTINEL NODE BIOPSY;  Surgeon: Robert Bellow, MD;  Location: [facility] ORS;  Service: General;                Laterality: Left; No date: TUBAL LIGATION  BMI    Body Mass Index: 30.04 kg/m      Reproductive/Obstetrics negative OB ROS                             Anesthesia Physical Anesthesia Plan  ASA:  II  Anesthesia Plan: General   Post-op Pain Management:    Induction:   PONV Risk Score and Plan: Propofol infusion and TIVA  Airway Management Planned: Nasal Cannula  Additional Equipment:   Intra-op Plan:   Post-operative Plan:   Informed Consent: I have reviewed the patients History and Physical, chart, labs and discussed the procedure including the risks, benefits and alternatives for the proposed anesthesia with the patient or authorized representative who has indicated his/her understanding and acceptance.     Dental Advisory Given  Plan Discussed with: Anesthesiologist, CRNA and Surgeon  Anesthesia Plan Comments:         Anesthesia Quick Evaluation

## 2019-12-27 NOTE — Progress Notes (Signed)
   12/27/19 0733  Clinical Encounter Type  Visited With Family  Visit Type Initial  Referral From Chaplain  Consult/Referral To Chaplain  While rounding SDS waiting area, chaplain visited with Pt's wife who was having a colonoscopy. Pt's husband did not have any questions or concerns. He said his wife is hungry.

## 2019-12-28 NOTE — Progress Notes (Signed)
Non-identified Voicemail.  No Message left. ?

## 2019-12-30 LAB — SURGICAL PATHOLOGY

## 2019-12-31 ENCOUNTER — Encounter: Payer: Self-pay | Admitting: Gastroenterology

## 2020-01-15 ENCOUNTER — Inpatient Hospital Stay: Payer: No Typology Code available for payment source | Attending: Internal Medicine | Admitting: Occupational Therapy

## 2020-01-15 ENCOUNTER — Telehealth: Payer: Self-pay | Admitting: *Deleted

## 2020-01-15 ENCOUNTER — Encounter: Payer: Self-pay | Admitting: *Deleted

## 2020-01-15 DIAGNOSIS — Z90721 Acquired absence of ovaries, unilateral: Secondary | ICD-10-CM | POA: Insufficient documentation

## 2020-01-15 DIAGNOSIS — F17210 Nicotine dependence, cigarettes, uncomplicated: Secondary | ICD-10-CM | POA: Insufficient documentation

## 2020-01-15 DIAGNOSIS — I890 Lymphedema, not elsewhere classified: Secondary | ICD-10-CM

## 2020-01-15 DIAGNOSIS — E119 Type 2 diabetes mellitus without complications: Secondary | ICD-10-CM | POA: Insufficient documentation

## 2020-01-15 DIAGNOSIS — Z803 Family history of malignant neoplasm of breast: Secondary | ICD-10-CM | POA: Insufficient documentation

## 2020-01-15 DIAGNOSIS — Z794 Long term (current) use of insulin: Secondary | ICD-10-CM | POA: Insufficient documentation

## 2020-01-15 DIAGNOSIS — Z8379 Family history of other diseases of the digestive system: Secondary | ICD-10-CM | POA: Insufficient documentation

## 2020-01-15 DIAGNOSIS — Z170 Estrogen receptor positive status [ER+]: Secondary | ICD-10-CM | POA: Insufficient documentation

## 2020-01-15 DIAGNOSIS — Z800 Family history of malignant neoplasm of digestive organs: Secondary | ICD-10-CM | POA: Insufficient documentation

## 2020-01-15 DIAGNOSIS — C50512 Malignant neoplasm of lower-outer quadrant of left female breast: Secondary | ICD-10-CM | POA: Insufficient documentation

## 2020-01-15 DIAGNOSIS — G4700 Insomnia, unspecified: Secondary | ICD-10-CM | POA: Insufficient documentation

## 2020-01-15 DIAGNOSIS — Z5111 Encounter for antineoplastic chemotherapy: Secondary | ICD-10-CM | POA: Insufficient documentation

## 2020-01-15 DIAGNOSIS — Z8042 Family history of malignant neoplasm of prostate: Secondary | ICD-10-CM | POA: Insufficient documentation

## 2020-01-15 DIAGNOSIS — Z886 Allergy status to analgesic agent status: Secondary | ICD-10-CM | POA: Insufficient documentation

## 2020-01-15 DIAGNOSIS — Z79899 Other long term (current) drug therapy: Secondary | ICD-10-CM | POA: Insufficient documentation

## 2020-01-15 DIAGNOSIS — Z833 Family history of diabetes mellitus: Secondary | ICD-10-CM | POA: Insufficient documentation

## 2020-01-15 DIAGNOSIS — R5383 Other fatigue: Secondary | ICD-10-CM | POA: Insufficient documentation

## 2020-01-15 DIAGNOSIS — M2550 Pain in unspecified joint: Secondary | ICD-10-CM | POA: Insufficient documentation

## 2020-01-15 DIAGNOSIS — E785 Hyperlipidemia, unspecified: Secondary | ICD-10-CM | POA: Insufficient documentation

## 2020-01-15 DIAGNOSIS — F32A Depression, unspecified: Secondary | ICD-10-CM | POA: Insufficient documentation

## 2020-01-15 NOTE — Telephone Encounter (Signed)
Faxed orders to [facility] for Jovipak unilateral post mastectomy pad and daytime compression sleeve

## 2020-01-15 NOTE — Therapy (Signed)
[facility] [address] [place], [place], [place], [address] Phone: [phone]   Fax:  [phone]  Occupational Therapy Screen  Patient Details  Name: Carolyn Roy MRN: [id number] Date of Birth: 07/02/1965 No data recorded  Encounter Date: 01/15/2020   OT End of Session - 01/15/20 0904    Visit Number 0           Past Medical History:  Diagnosis Date  . Allergy   . Breast cancer ([place])   . Cancer ([place]) 06/15/2017   5.1 cm, T3,N1 (clinical): ER/ PR positive, Her 2 neu not overexpressed, High Ki 67. Neuoadjuvant chemotherapy.   . Depression   . Diabetes mellitus without complication ([place]) 7262  . Endometriosis   . Family history of breast cancer   . Headache    migraines  . Hyperlipidemia   . Ovarian mass   . Personal history of chemotherapy   . Personal history of radiation therapy   . Pneumonia    2018    Past Surgical History:  Procedure Laterality Date  . AXILLARY LYMPH NODE BIOPSY Left 07/14/2017   Procedure: INSERTION GEL MARK CLIP LEFT AXILLA;  Surgeon: Robert Bellow, MD;  Location: [facility] ORS;  Service: General;  Laterality: Left;  . BREAST BIOPSY Left    Dr Orlene Och BREAST METASTATIC CARCINOMA  . BREAST LUMPECTOMY Left 01/12/2018  . COLONOSCOPY WITH PROPOFOL N/A 12/27/2019   Procedure: COLONOSCOPY WITH PROPOFOL;  Surgeon: Virgel Manifold, MD;  Location: [facility] ENDOSCOPY;  Service: Endoscopy;  Laterality: N/A;  . OOPHORECTOMY    . PARTIAL MASTECTOMY WITH NEEDLE LOCALIZATION Left 01/12/2018   Procedure: PARTIAL MASTECTOMY WITH NEEDLE LOCALIZATION;  Surgeon: Robert Bellow, MD;  Location: [facility] ORS;  Service: General;  Laterality: Left;  . PORTACATH PLACEMENT Right 07/14/2017   Procedure: INSERTION PORT-A-CATH;  Surgeon: Robert Bellow, MD;  Location: [facility] ORS;  Service: General;  Laterality: Right;  . SENTINEL NODE BIOPSY Left 01/12/2018   Procedure: SENTINEL NODE BIOPSY;  Surgeon:  Robert Bellow, MD;  Location: [facility] ORS;  Service: General;  Laterality: Left;  . TUBAL LIGATION      There were no vitals filed for this visit.   Subjective Assessment - 01/15/20 0901    Subjective  Doing okay but my L arm feels heavy at times and also under my arm - feels like ball - sensation better under my arm  I can shave and feel more -doing exercises at work 2 x day - doing okay    Currently in Pain? No/denies               LYMPHEDEMA/ONCOLOGY QUESTIONNAIRE - 01/15/20 0001      Right Upper Extremity Lymphedema   15 cm Proximal to Olecranon Process 31.5 cm (P)     Olecranon Process 25 cm (P)     15 cm Proximal to Ulnar Styloid Process 24.8 cm (P)     Just Proximal to Ulnar Styloid Process 16.8 cm (P)       Left Upper Extremity Lymphedema   15 cm Proximal to Olecranon Process 33.5 cm (P)     10 cm Proximal to Olecranon Process 32 cm (P)     Olecranon Process 25.8 cm (P)     15 cm Proximal to Ulnar Styloid Process 24.5 cm (P)     10 cm Proximal to Ulnar Styloid Process 22.2 cm (P)     Just Proximal to Ulnar Styloid Process 17.3 cm (P)  Across Hand at [employer] 19.5 cm (P)               OT NOTE 12/25/2019:  Pt works for the past 9 years at [employer] -and has 4 large dogs - take care of them , do own house work but no yard work. She is R hand dominant  She had 5 ln removed -and lumpectomy incision got infected and that delayed her start of radiation Pt report her shoulder are stiff in the morning - they do stretches at work 2 x day that helps Pt tender under axilla and over axilla scar - scar on breast only 1/10 Hyper sensitive under arm and axilla Pt ed on desentitization and scar massage  Ed on HEP for AAROM for shoulder flexion and ABD on wall - 10 reps hold 3-5 sec And Ext rotation - 10 reps hold 3 -5 sec  Stretch should be less than 2/10   Pt show increase circumference in L UE - non dominant UE - with upper arm increase by 2.5 cm , forearm   1 cm and wrist 0.7 cm  Pt report her arm feels heavier as the day goes Pt ed on lymphedema info and precautions and prevention/control of lymphedema-( pt had what appear dog scratches on L forearm) Pt stage 1 lymphedema Would recommend over the counter sleeve and glove - to use at work - and for high risk activities Pt verbalize understanding and will follow up with me in 3 wks   NOTE FROM TODAY 01/15/2020: Pt arrive with still reports of stiffness L shoulder - but under the arm- improving and doing 2 x day exercises at work. No pain, just some stiffness. Report her sensation under arm getting better- Has more sensation now - can feel now if she shaves. Pt to cont with scar massage and desensitization. Pt report that her insurance do not cover her compression sleeve and glove. 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 Pt has today some cuts and scrapes again on her L hand and wrist - remind pt again on precautions for her with her L UE - she had 4 big dogs and work at [employer] - need to protect that arm - but use normally if she has compression on.  Pt to call when she pick up her compression garments.                         P      Visit Diagnosis: Lymphedema, not elsewhere classified    Problem List Patient Active Problem List   Diagnosis Date Noted  . Family history of colon cancer in mother   . Polyp of colon   . Uncomplicated opioid dependence ([place]) 09/18/2019  . Numbness and tingling of upper extremity (C6/C7 dermatomes) (Right) 04/30/2019  . Personal history of breast cancer 04/30/2019  . Cervicalgia 04/30/2019  . Cervical radiculitis (Right) 04/30/2019  . 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 Spondylosis without myelopathy or radiculopathy, lumbosacral region 08/16/2018  . DDD (degenerative disc disease), lumbosacral 08/16/2018  . Abnormal MRI, lumbar spine (04/20/2018) 05/09/2018  . Lumbar facet arthropathy 05/09/2018  . Chronic low back  pain (Bilateral (L>R) w/o sciatica 03/14/2018  . Chronic hip pain (Left) 03/14/2018  . Lumbar facet syndrome (Bilateral) (L>R) 03/14/2018  . Idiopathic scoliosis 03/14/2018  . Chronic sacroiliac joint pain (Left) 03/14/2018  . Chronic pain of knee (Right) 03/14/2018  . Chronic upper extremity pain [facility] Area of Pain) (Bilateral) (R>L) 02/14/2018  . Cancer-related pain 02/14/2018  . Vitamin D deficiency 02/14/2018  . Osteoarthritis of knee (Right) 02/14/2018  . Type 2 diabetes mellitus with hyperglycemia, with long-term current use of insulin ([place]) 01/23/2018  . Chronic feet pain (Primary Area of Pain) (Bilateral) (R>L) 01/22/2018  . Chronic knee pain (Secondary Area of Pain) (Bilateral) (R>L) 01/22/2018  . Chronic hand pain [facility] Area of Pain) (Bilateral) (R>L) 01/22/2018  . Chronic pain syndrome 01/22/2018  . Disorder of skeletal system 01/22/2018  . Chronic hip pain (Fourth Area of Pain) (Bilateral) (R>L) 01/22/2018  . Malignant neoplasm of lower-outer quadrant of left breast of female, estrogen receptor positive ([place]) 11/08/2017  . Chemotherapy-induced neuropathy ([place]) 10/19/2017  . Goals of care, counseling/discussion 07/20/2017  . Encounter for antineoplastic chemotherapy 07/20/2017  . Cervical polyp 07/05/2017  . Family history of breast cancer   . Cancer of midline of breast, left ([place]) 06/15/2017  . Stage 1 mild COPD by GOLD classification ([place]) 03/31/2017  . Hyperlipidemia associated with type 2 diabetes mellitus ([place]) 03/03/2017  . Nicotine dependence, cigarettes, w unsp disorders 11/15/2016  . Chronic fatigue 03/30/2015  . Severe depression ([place]) 03/30/2015    Rosalyn Gess  OTR/L,CLT 01/15/2020, 9:05 AM  [facility] [address] [place], [place], [place], [address] Phone: [phone]   Fax:  [phone]  Name: Carolyn Roy MRN: [id number] Date of Birth: [birth year]

## 2020-01-20 ENCOUNTER — Ambulatory Visit (INDEPENDENT_AMBULATORY_CARE_PROVIDER_SITE_OTHER): Payer: No Typology Code available for payment source | Admitting: Nurse Practitioner

## 2020-01-20 ENCOUNTER — Other Ambulatory Visit: Payer: Self-pay

## 2020-01-20 ENCOUNTER — Encounter: Payer: Self-pay | Admitting: Nurse Practitioner

## 2020-01-20 VITALS — BP 96/64 | HR 86 | Temp 98.1°F | Ht 64.72 in | Wt 176.8 lb

## 2020-01-20 DIAGNOSIS — E1165 Type 2 diabetes mellitus with hyperglycemia: Secondary | ICD-10-CM

## 2020-01-20 DIAGNOSIS — G620 Drug-induced polyneuropathy: Secondary | ICD-10-CM

## 2020-01-20 DIAGNOSIS — F322 Major depressive disorder, single episode, severe without psychotic features: Secondary | ICD-10-CM | POA: Diagnosis not present

## 2020-01-20 DIAGNOSIS — F17219 Nicotine dependence, cigarettes, with unspecified nicotine-induced disorders: Secondary | ICD-10-CM

## 2020-01-20 DIAGNOSIS — E1169 Type 2 diabetes mellitus with other specified complication: Secondary | ICD-10-CM

## 2020-01-20 DIAGNOSIS — C50812 Malignant neoplasm of overlapping sites of left female breast: Secondary | ICD-10-CM | POA: Diagnosis not present

## 2020-01-20 DIAGNOSIS — E785 Hyperlipidemia, unspecified: Secondary | ICD-10-CM

## 2020-01-20 DIAGNOSIS — T451X5A Adverse effect of antineoplastic and immunosuppressive drugs, initial encounter: Secondary | ICD-10-CM

## 2020-01-20 DIAGNOSIS — Z794 Long term (current) use of insulin: Secondary | ICD-10-CM

## 2020-01-20 LAB — BAYER DCA HB A1C WAIVED: HB A1C (BAYER DCA - WAIVED): 6.8 % (ref <7.0)

## 2020-01-20 NOTE — Assessment & Plan Note (Signed)
Chronic, ongoing.  Continue Atorvastatin 20 MG daily.  Lipid panel today and adjust dose as needed.

## 2020-01-20 NOTE — Assessment & Plan Note (Addendum)
Chronic, ongoing.  Continue current medication regimen and collaboration with psychiatry.  Provided her with list of therapists to contact.

## 2020-01-20 NOTE — Assessment & Plan Note (Signed)
Continue collaboration with oncology, recent note reviewed. 

## 2020-01-20 NOTE — Assessment & Plan Note (Signed)
Chronic, ongoing.  Continue current pain management collaboration and medication regimen as prescribed by them. 

## 2020-01-20 NOTE — Progress Notes (Signed)
BP 96/64   Pulse 86   Temp 98.1 F (36.7 C) (Oral)   Ht 5' 4.72" (1.644 m)   Wt 176 lb 12.8 oz (80.2 kg)   LMP  (LMP Unknown) Comment: LAST PERIOD IN MAY 2019 WHEN SHE STARTED CHEMO  SpO2 96%   BMI 29.67 kg/m    Subjective:    Patient ID: Carolyn Roy, female    DOB: 1965/07/18, 54 y.o.   MRN: [id number]  HPI: Carolyn Roy is a 54 y.o. female  Chief Complaint  Patient presents with  . Diabetes  . Hyperlipidemia  . mood    Mood has been better    DIABETES Last A1C 7.5%. Continues on Invokana 300 MG and Ozempic 0.5 MG weekly. Has tried Metformin, which she no longer wishes to take due to breast cancer, she had read online this could cause breast CA.  Takes Pregabalin for neuropathy pain, followed by oncology and last seen 12/25/2019.  She is also followed by pain clinic for chronic pain -- last visit 11/04/19.  She is a smoker, 1/2 to 1 PPD.  Quit for 6 years, but then started back.   Hypoglycemic episodes:no Polydipsia/polyuria: no Visual disturbance: no Chest pain: no Paresthesias: no Glucose Monitoring: yes  Accucheck frequency: BID  Fasting glucose: 90 to 160  Post prandial:  Evening:   Before meals: 160 range Taking Insulin?: yes  Long acting insulin: 15 units  Short acting insulin: Blood Pressure Monitoring: not checking Retinal Examination: Up To Date -- went in April -- [facility] Foot Exam: Up to Date Pneumovax: Up To Date Influenza: Up To Date Aspirin: no   HYPERLIPIDEMIA Taking Atorvastatin 20 MG daily. Satisfied with current treatment? yes Duration of hyperlipidemia: chronic Cholesterol medication side effects: no Cholesterol supplements: none Aspirin: no Recent stressors: no Recurrent headaches: no Visual changes: no Palpitations: no Dyspnea: no Chest pain: no Lower extremity edema: no Dizzy/lightheaded: no  The 10-year ASCVD risk score Mikey Bussing DC Jr., et al., 2013) is: 4.9%   Values used to calculate the score:     Age: 34 years      Sex: Female     Is Non-Hispanic African American: No     Diabetic: Yes     Tobacco smoker: Yes     Systolic Blood Pressure: 96 mmHg     Is BP treated: No     HDL Cholesterol: 44 mg/dL     Total Cholesterol: 180 mg/dL   DEPRESSION Taking Duloxetine 60 MG daily and Sertraline 50 MG every morning.  Followed by Dr. Nicolasa Ducking and last saw in November.    Is currently followed by oncology for breast CA, left side.  Last saw oncology on 12/25/2019 -- she returns Wednesday to see Dr. Rogue Bussing -- continues on chemo infusions. Mood status: stable Satisfied with current treatment?: yes Symptom severity: mild  Duration of current treatment : chronic Side effects: no Medication compliance: good compliance Psychotherapy/counseling: looking for therapist Depressed mood: no Anxious mood: no Anhedonia: no Significant weight loss or gain: no Insomnia: yes hard to stay asleep Fatigue: no Feelings of worthlessness or guilt: no Impaired concentration/indecisiveness: no Suicidal ideations: no Hopelessness: no Crying spells: no Depression screen [facility] 2/9 01/20/2020 10/18/2019 09/18/2019 06/25/2019 04/16/2019  Decreased Interest 0 0 0 0 0  Down, Depressed, Hopeless 0 0 0 0 0  PHQ - 2 Score 0 0 0 0 0  Altered sleeping 2 0 - - -  Tired, decreased energy 0 1 - - -  Change in  appetite 3 2 - - -  Feeling bad or failure about yourself  0 0 - - -  Trouble concentrating 0 0 - - -  Moving slowly or fidgety/restless 0 0 - - -  Suicidal thoughts 0 0 - - -  PHQ-9 Score 5 3 - - -  Difficult doing work/chores Somewhat difficult - Not difficult at all - -  Some recent data might be hidden    Relevant past medical, surgical, family and social history reviewed and updated as indicated. Interim medical history since our last visit reviewed. Allergies and medications reviewed and updated.  Review of Systems  Constitutional: Negative for activity change, appetite change, diaphoresis, fatigue and fever.   Respiratory: Negative for cough, chest tightness and shortness of breath.   Cardiovascular: Negative for chest pain, palpitations and leg swelling.  Gastrointestinal: Negative.   Endocrine: Negative for cold intolerance, heat intolerance, polydipsia, polyphagia and polyuria.  Neurological: Negative.   Psychiatric/Behavioral: Negative.     Per HPI unless specifically indicated above     Objective:    BP 96/64   Pulse 86   Temp 98.1 F (36.7 C) (Oral)   Ht 5' 4.72" (1.644 m)   Wt 176 lb 12.8 oz (80.2 kg)   LMP  (LMP Unknown) Comment: LAST PERIOD IN MAY 2019 WHEN SHE STARTED CHEMO  SpO2 96%   BMI 29.67 kg/m   Wt Readings from Last 3 Encounters:  01/20/20 176 lb 12.8 oz (80.2 kg)  12/27/19 175 lb (79.4 kg)  12/25/19 175 lb (79.4 kg)    Physical Exam Vitals and nursing note reviewed.  Constitutional:      General: She is awake.     Appearance: She is well-developed and well-groomed.  HENT:     Head: Normocephalic.     Right Ear: Hearing normal.     Left Ear: Hearing normal.  Eyes:     General: Lids are normal.        Right eye: No discharge.        Left eye: No discharge.     Conjunctiva/sclera: Conjunctivae normal.     Pupils: Pupils are equal, round, and reactive to light.  Neck:     Thyroid: No thyromegaly.     Vascular: No carotid bruit.  Cardiovascular:     Rate and Rhythm: Normal rate and regular rhythm.     Heart sounds: Normal heart sounds. No murmur heard. No gallop.   Pulmonary:     Effort: Pulmonary effort is normal. No accessory muscle usage or respiratory distress.     Breath sounds: Normal breath sounds.  Abdominal:     General: Bowel sounds are normal.     Palpations: Abdomen is soft.  Musculoskeletal:     Cervical back: Normal range of motion and neck supple.     Right lower leg: No edema.     Left lower leg: No edema.  Skin:    General: Skin is warm and dry.  Neurological:     Mental Status: She is alert and oriented to person, place, and  time.  Psychiatric:        Attention and Perception: Attention normal.        Mood and Affect: Mood normal.        Speech: Speech normal.        Behavior: Behavior normal. Behavior is cooperative.        Thought Content: Thought content normal.    Results for orders placed or performed during the hospital  encounter of 12/27/19  Glucose, capillary  Result Value Ref Range   Glucose-Capillary 126 (H) 70 - 99 mg/dL   Comment 1 IN EPIC   Surgical pathology  Result Value Ref Range   SURGICAL PATHOLOGY      SURGICAL PATHOLOGY CASE: [id number] PATIENT: Leshay WHITE Surgical Pathology Report     Specimen Submitted: A. Colon polyp  ulcer, Cecum; cbx  Clinical History: Screening colonoscopy, mother colon cancer, Cecal ulcer and polyp, diverticulosis, hemorrhoids    DIAGNOSIS: A. COLON POLYP AND ULCER; CECUM; COLD BIOPSY: - ULCERATION, GRANULATION TISSUE TYPE CHANGES, AND FIBRINOPURULENT DEBRIS. - BENIGN COLONIC MUCOSA WITH SUPERFICIAL REACTIVE CHANGES AND SMALL LYMPHOID AGGREGATE. - NEGATIVE FOR DYSPLASIA AND MALIGNANCY.  Comment: The clinical impression of a solitary cecal ulcer is noted. Multiple deeper recut levels were examined, as ulceration was not readily apparent on initial sections. The findings are non-specific, but isolated cecal ulcer may be seen in the setting of drug/medication reaction (NSAIDs, etc), infection, chemical reaction, or as a manifestation of inflammatory bowel disease. No definite viral cytopathic effect is identified. There is  insufficient tissue present following initial recuts for further testing. Clinical and endoscopic correlation is recommended.  GROSS DESCRIPTION: A. Labeled: Cecal ulcer and adjacent polyp cold biopsy Received: In formalin Tissue fragment(s): Several Size: 0.7 x 0.3 x 0.1 cm Description: Aggregate of pink-white tissue fragments Entirely submitted in 1 cassette.   Final Diagnosis performed by Allena Napoleon, MD.    Electronically signed 12/30/2019 3:08:07PM The electronic signature indicates that the named Attending Pathologist has evaluated the specimen Technical component performed at [facility], [address], [place], [place] [address] Lab: [id number] Dir: Rush Farmer, MD, MMM  Professional component performed at [facility], [facility], [place], [place], [place] [address] Lab: [id number] Dir: Dellia Nims. Reuel Derby, MD       Assessment & Plan:   Problem List Items Addressed This Visit      Endocrine   Hyperlipidemia associated with type 2 diabetes mellitus ([place])    Chronic, ongoing.  Continue Atorvastatin 20 MG daily.  Lipid panel today and adjust dose as needed.      Relevant Medications   JARDIANCE 25 MG TABS tablet   Other Relevant Orders   Bayer DCA Hb A1c Waived   Lipid Panel w/o Chol/HDL Ratio   Type 2 diabetes mellitus with hyperglycemia, with long-term current use of insulin (HCC) - Primary    Chronic, ongoing with A1C 6.8% today, trending down and below goal -- praised for success.  Previously followed by endo, but prefers not to return at this time. Continue Invokana and Basaglar at 15 units.  Continue Trulicity due to insurance coverage and will adjust as needed.  She would like to come off insulin in future, will see if possible with ongoing GLP use -- consider increase in Trulicity next visit if A1C remains stable.  Recommend continue to monitor BS at home BID and heavily focus on diet changes.    Return in 3 months.      Relevant Medications   JARDIANCE 25 MG TABS tablet   Other Relevant Orders   Bayer DCA Hb A1c Waived     Nervous and Auditory   Chemotherapy-induced neuropathy (HCC) (Chronic)    Chronic, ongoing.  Continue current pain management collaboration and medication regimen as prescribed by them.      Nicotine dependence, cigarettes, w unsp disorders    I have recommended complete cessation of tobacco use. I have discussed various options  available for  assistance with tobacco cessation including over the counter methods (Nicotine gum, patch and lozenges). We also discussed prescription options (Chantix, Nicotine Inhaler / Nasal Spray). The patient is not interested in pursuing any prescription tobacco cessation options at this time.         Other   Severe depression (HCC)    Chronic, ongoing.  Continue current medication regimen and collaboration with psychiatry.  Provided her with list of therapists to contact.      Cancer of midline of breast, left [facility])    Continue collaboration with oncology, recent note reviewed.          Follow up plan: Return in about 3 months (around 04/19/2020) for T2DM, HTN/HLD, MOOD -- to meet new PCP.

## 2020-01-20 NOTE — Assessment & Plan Note (Signed)
Chronic, ongoing with A1C 6.8% today, trending down and below goal -- praised for success.  Previously followed by endo, but prefers not to return at this time. Continue Invokana and Basaglar at 15 units.  Continue Trulicity due to insurance coverage and will adjust as needed.  She would like to come off insulin in future, will see if possible with ongoing GLP use -- consider increase in Trulicity next visit if A1C remains stable.  Recommend continue to monitor BS at home BID and heavily focus on diet changes.    Return in 3 months.

## 2020-01-20 NOTE — Assessment & Plan Note (Signed)
I have recommended complete cessation of tobacco use. I have discussed various options available for assistance with tobacco cessation including over the counter methods (Nicotine gum, patch and lozenges). We also discussed prescription options (Chantix, Nicotine Inhaler / Nasal Spray). The patient is not interested in pursuing any prescription tobacco cessation options at this time.  

## 2020-01-20 NOTE — Patient Instructions (Signed)
[employer] for therapy

## 2020-01-21 ENCOUNTER — Ambulatory Visit
Admission: RE | Admit: 2020-01-21 | Discharge: 2020-01-21 | Disposition: A | Discharge status: Home / Self Care | Payer: No Typology Code available for payment source | Source: Ambulatory Visit | Attending: Internal Medicine | Admitting: Internal Medicine

## 2020-01-21 DIAGNOSIS — Z9221 Personal history of antineoplastic chemotherapy: Secondary | ICD-10-CM | POA: Insufficient documentation

## 2020-01-21 DIAGNOSIS — Z780 Asymptomatic menopausal state: Secondary | ICD-10-CM | POA: Insufficient documentation

## 2020-01-21 DIAGNOSIS — Z853 Personal history of malignant neoplasm of breast: Secondary | ICD-10-CM | POA: Diagnosis not present

## 2020-01-21 DIAGNOSIS — E119 Type 2 diabetes mellitus without complications: Secondary | ICD-10-CM | POA: Diagnosis not present

## 2020-01-21 DIAGNOSIS — F17200 Nicotine dependence, unspecified, uncomplicated: Secondary | ICD-10-CM | POA: Insufficient documentation

## 2020-01-21 DIAGNOSIS — Z923 Personal history of irradiation: Secondary | ICD-10-CM | POA: Insufficient documentation

## 2020-01-21 DIAGNOSIS — Z79811 Long term (current) use of aromatase inhibitors: Secondary | ICD-10-CM | POA: Insufficient documentation

## 2020-01-21 DIAGNOSIS — Z13820 Encounter for screening for osteoporosis: Secondary | ICD-10-CM | POA: Insufficient documentation

## 2020-01-21 LAB — LIPID PANEL W/O CHOL/HDL RATIO
Cholesterol, Total: 241 mg/dL — ABNORMAL HIGH (ref 100–199)
HDL: 41 mg/dL (ref >39)
LDL Chol Calc (NIH): 164 mg/dL — ABNORMAL HIGH (ref 0–99)
Triglycerides: 195 mg/dL — ABNORMAL HIGH (ref 0–149)
VLDL Cholesterol Cal: 36 mg/dL (ref 5–40)

## 2020-01-21 NOTE — Progress Notes (Signed)
Contacted via [place] afternoon Barnetta Chapel, I believe you were not fasting for this cholesterol check correct?  Your levels are quite elevated.  I would like you to continue your Atorvastatin daily and for next visit if you could come fasting so we can recheck these.  Any questions? Keep being awesome!!  Thank you for allowing me to participate in your care. Kindest regards, Longino Trefz

## 2020-01-22 ENCOUNTER — Inpatient Hospital Stay: Payer: No Typology Code available for payment source

## 2020-01-22 ENCOUNTER — Other Ambulatory Visit: Payer: Self-pay

## 2020-01-22 ENCOUNTER — Encounter: Payer: Self-pay | Admitting: Internal Medicine

## 2020-01-22 ENCOUNTER — Inpatient Hospital Stay (HOSPITAL_BASED_OUTPATIENT_CLINIC_OR_DEPARTMENT_OTHER): Payer: No Typology Code available for payment source | Admitting: Internal Medicine

## 2020-01-22 DIAGNOSIS — Z803 Family history of malignant neoplasm of breast: Secondary | ICD-10-CM | POA: Diagnosis not present

## 2020-01-22 DIAGNOSIS — Z800 Family history of malignant neoplasm of digestive organs: Secondary | ICD-10-CM | POA: Diagnosis not present

## 2020-01-22 DIAGNOSIS — Z8042 Family history of malignant neoplasm of prostate: Secondary | ICD-10-CM | POA: Diagnosis not present

## 2020-01-22 DIAGNOSIS — E785 Hyperlipidemia, unspecified: Secondary | ICD-10-CM | POA: Diagnosis not present

## 2020-01-22 DIAGNOSIS — Z886 Allergy status to analgesic agent status: Secondary | ICD-10-CM | POA: Diagnosis not present

## 2020-01-22 DIAGNOSIS — Z170 Estrogen receptor positive status [ER+]: Secondary | ICD-10-CM

## 2020-01-22 DIAGNOSIS — F17210 Nicotine dependence, cigarettes, uncomplicated: Secondary | ICD-10-CM | POA: Diagnosis not present

## 2020-01-22 DIAGNOSIS — M2550 Pain in unspecified joint: Secondary | ICD-10-CM | POA: Diagnosis not present

## 2020-01-22 DIAGNOSIS — G4700 Insomnia, unspecified: Secondary | ICD-10-CM | POA: Diagnosis not present

## 2020-01-22 DIAGNOSIS — I890 Lymphedema, not elsewhere classified: Secondary | ICD-10-CM | POA: Diagnosis not present

## 2020-01-22 DIAGNOSIS — Z5111 Encounter for antineoplastic chemotherapy: Secondary | ICD-10-CM | POA: Diagnosis not present

## 2020-01-22 DIAGNOSIS — Z833 Family history of diabetes mellitus: Secondary | ICD-10-CM | POA: Diagnosis not present

## 2020-01-22 DIAGNOSIS — Z8379 Family history of other diseases of the digestive system: Secondary | ICD-10-CM | POA: Diagnosis not present

## 2020-01-22 DIAGNOSIS — C50512 Malignant neoplasm of lower-outer quadrant of left female breast: Secondary | ICD-10-CM

## 2020-01-22 DIAGNOSIS — Z79899 Other long term (current) drug therapy: Secondary | ICD-10-CM | POA: Diagnosis not present

## 2020-01-22 DIAGNOSIS — R5383 Other fatigue: Secondary | ICD-10-CM | POA: Diagnosis not present

## 2020-01-22 DIAGNOSIS — F32A Depression, unspecified: Secondary | ICD-10-CM | POA: Diagnosis not present

## 2020-01-22 DIAGNOSIS — E119 Type 2 diabetes mellitus without complications: Secondary | ICD-10-CM | POA: Diagnosis not present

## 2020-01-22 DIAGNOSIS — Z794 Long term (current) use of insulin: Secondary | ICD-10-CM | POA: Diagnosis not present

## 2020-01-22 DIAGNOSIS — Z90721 Acquired absence of ovaries, unilateral: Secondary | ICD-10-CM | POA: Diagnosis not present

## 2020-01-22 LAB — CBC WITH DIFFERENTIAL/PLATELET
Abs Immature Granulocytes: 0.02 10*3/uL (ref 0.00–0.07)
Basophils Absolute: 0 10*3/uL (ref 0.0–0.1)
Basophils Relative: 1 %
Eosinophils Absolute: 0 10*3/uL (ref 0.0–0.5)
Eosinophils Relative: 1 %
HCT: 45.1 % (ref 36.0–46.0)
Hemoglobin: 15 g/dL (ref 12.0–15.0)
Immature Granulocytes: 0 %
Lymphocytes Relative: 22 %
Lymphs Abs: 1.5 10*3/uL (ref 0.7–4.0)
MCH: 31.8 pg (ref 26.0–34.0)
MCHC: 33.3 g/dL (ref 30.0–36.0)
MCV: 95.8 fL (ref 80.0–100.0)
Monocytes Absolute: 0.3 10*3/uL (ref 0.1–1.0)
Monocytes Relative: 5 %
Neutro Abs: 4.7 10*3/uL (ref 1.7–7.7)
Neutrophils Relative %: 71 %
Platelets: 215 10*3/uL (ref 150–400)
RBC: 4.71 MIL/uL (ref 3.87–5.11)
RDW: 13.7 % (ref 11.5–15.5)
WBC: 6.6 10*3/uL (ref 4.0–10.5)
nRBC: 0 % (ref 0.0–0.2)

## 2020-01-22 LAB — COMPREHENSIVE METABOLIC PANEL
ALT: 10 U/L (ref 0–44)
AST: 11 U/L — ABNORMAL LOW (ref 15–41)
Albumin: 3.9 g/dL (ref 3.5–5.0)
Alkaline Phosphatase: 93 U/L (ref 38–126)
Anion gap: 10 (ref 5–15)
BUN: 13 mg/dL (ref 6–20)
CO2: 28 mmol/L (ref 22–32)
Calcium: 9.2 mg/dL (ref 8.9–10.3)
Chloride: 102 mmol/L (ref 98–111)
Creatinine, Ser: 0.58 mg/dL (ref 0.44–1.00)
GFR, Estimated: >60 mL/min (ref >60)
Glucose, Bld: 159 mg/dL — ABNORMAL HIGH (ref 70–99)
Potassium: 4.2 mmol/L (ref 3.5–5.1)
Sodium: 140 mmol/L (ref 135–145)
Total Bilirubin: 0.8 mg/dL (ref 0.3–1.2)
Total Protein: 7.3 g/dL (ref 6.5–8.1)

## 2020-01-22 MED ORDER — HEPARIN SOD (PORK) LOCK FLUSH 100 UNIT/ML IV SOLN
500.0000 [IU] | Freq: Once | INTRAVENOUS | Status: AC
  Administered 2020-01-22: 500 [IU] via INTRAVENOUS
  Filled 2020-01-22: qty 5

## 2020-01-22 MED ORDER — ZOLEDRONIC ACID 4 MG/5ML IV CONC: 4.0000 mg | Freq: Once | INTRAVENOUS | Status: DC

## 2020-01-22 MED ORDER — SODIUM CHLORIDE 0.9 % IV SOLN
INTRAVENOUS | Status: DC
  Filled 2020-01-22 (×2): qty 250

## 2020-01-22 MED ORDER — GOSERELIN ACETATE 10.8 MG ~~LOC~~ IMPL
10.8000 mg | DRUG_IMPLANT | Freq: Once | SUBCUTANEOUS | Status: AC
  Administered 2020-01-22: 10.8 mg via SUBCUTANEOUS
  Filled 2020-01-22: qty 10.8

## 2020-01-22 MED ORDER — SODIUM CHLORIDE 0.9% FLUSH
10.0000 mL | INTRAVENOUS | Status: DC | PRN
  Administered 2020-01-22: 10 mL via INTRAVENOUS
  Filled 2020-01-22: qty 10

## 2020-01-22 MED ORDER — EXEMESTANE 25 MG PO TABS: 25.0000 mg | ORAL_TABLET | Freq: Every day | ORAL | 3 refills | Status: DC

## 2020-01-22 MED ORDER — HEPARIN SOD (PORK) LOCK FLUSH 100 UNIT/ML IV SOLN
INTRAVENOUS | Status: AC
  Filled 2020-01-22: qty 5

## 2020-01-22 MED ORDER — ERGOCALCIFEROL 1.25 MG (50000 UT) PO CAPS: 50000.0000 [IU] | ORAL_CAPSULE | ORAL | 1 refills | Status: DC

## 2020-01-22 MED ORDER — ZOLEDRONIC ACID 4 MG/100ML IV SOLN
4.0000 mg | Freq: Once | INTRAVENOUS | Status: AC
  Administered 2020-01-22: 4 mg via INTRAVENOUS
  Filled 2020-01-22: qty 100

## 2020-01-22 NOTE — Progress Notes (Signed)
1430- Patient tolerated treatment well. Patient stable and discharged to home at this time.

## 2020-01-22 NOTE — Assessment & Plan Note (Addendum)
#   breast cancer Stage III ER/PR pos her 2 NEG. currently on anastrazole+ Zoldadex.  No clinical evidence of recurrence. STABLE.Zoladex today.  #Given the poor tolerance to anastrozole [joint pains-multifactorial*] I think is reasonable to switch over to Aromasin.  New prescription started.  Again reviewed the potential side effects.  Continue Zometa every 6 months [BMD- dec 2021- Normal].  Calcium normal.  # Depression/ Insomnia-stable;  On cymblata/Zoloft-[ Dr.kapur]  # Joint pains-multifactorial [has not completely resolved after stopping anastrozole]-recommend vitamin D 50,000 once a week; recommend compliance with her medications.   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  Stable  #Lymphedema-left chest wall.  Undergoing physical therapy.  Stable  # DM-2 on insulin- STABLE [z1c-6.8]Blood glucose-162;   Defer to PCP  # DISPOSITION: # Zoladex  Today; zometa today.  # Follow up in 3 month;  MD;cbc/cmp/ca-27-29; Zoladex;  port flush. Dr.B

## 2020-01-22 NOTE — Progress Notes (Signed)
[place] CONSULT NOTE  Patient Care Team: Guadalupe Maple, MD as PCP - General (Family Medicine) Bary Castilla, [name], MD (General Surgery) [name], [name], [name] (Obstetrics and Gynecology) Cammie Sickle, MD as Consulting Physician (Oncology) Vanita Ingles, RN as Case Manager (General Practice)  CHIEF COMPLAINTS/PURPOSE OF CONSULTATION: Breast cancer  Oncology History Overview Note  # MAY 2019-  clinical stage IIIA (T3N1Mx) left breast cancer s/p biopsy on 06/14/2017. -Pathology revealed grade III invasive ductal carcinoma. -Axillary FNA revealed malignant cells c/w metastatic carcinoma. Tumor was ER + (90%), PR + (30%), Her2/neu - and Ki67 70%.  CA27.29 was 7.8 on 06/14/2017.  # She received 4 cycles of AC with Neulasta support (07/20/2017 - 08/31/2017).;  neoadjuvant Taxol on 09/14/2017.  #DEC 2019- Lumpectomy/sentinel lymph node biopsy [Dr.[name]]-complete pathologic response  # s/p RT [delayed sec to wound infection; Dr.[name]] finished RT [4/12]  # April 14th 2020- START TAM; stopped in mid-May secondary intolerance [severe migraines].  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 Secord/Berchuck]-negative for malignancy/ # DM-2- poorly controlled.   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   # PAP SMEAR- RECOMMENDED 2022- summer  -------------------------------------------  DIAGNOSIS: left breast cancer  STAGE:  III       ;GOALS: cure  CURRENT/MOST RECENT THERAPY Tam    Cancer of midline of breast, left (HCC)  06/15/2017 Initial Diagnosis   Cancer of midline of breast, left ([place])   07/20/2017 - 11/16/2017 Chemotherapy   The patient had dexamethasone (DECADRON) 4 MG tablet, 1 of 1 cycle, Start date: --, End date:  -- DOXOrubicin (ADRIAMYCIN) chemo injection 122 mg, 60 mg/m2 = 122 mg, Intravenous,  Once, 4 of 4 cycles Administration: 122 mg (07/20/2017), 122 mg (08/03/2017), 122 mg (08/17/2017), 122 mg (08/31/2017) palonosetron (ALOXI) injection 0.25 mg, 0.25 mg, Intravenous,  Once, 4 of 4 cycles Administration: 0.25 mg (07/20/2017), 0.25 mg (08/03/2017), 0.25 mg (08/17/2017), 0.25 mg (08/31/2017) pegfilgrastim (NEULASTA) injection 6 mg, 6 mg, Subcutaneous, Once, 5 of 5 cycles Administration: 6 mg (07/21/2017), 6 mg (08/04/2017), 6 mg (08/18/2017), 6 mg (09/01/2017) cyclophosphamide (CYTOXAN) 1,220 mg in sodium chloride 0.9 % 250 mL chemo infusion, 600 mg/m2 = 1,220 mg, Intravenous,  Once, 4 of 4 cycles Administration: 1,220 mg (07/20/2017), 1,220 mg (08/03/2017), 1,220 mg (08/17/2017), 1,220 mg (08/31/2017) PACLitaxel (TAXOL) 162 mg in sodium chloride 0.9 % 250 mL chemo infusion (</= 21m/m2), 80 mg/m2 = 162 mg, Intravenous,  Once, 10 of 12 cycles Dose modification: 65 mg/m2 (original dose 80 mg/m2, Cycle 13, Reason: Provider Judgment, Comment: neuropathy) Administration: 162 mg (09/14/2017), 162 mg (09/21/2017), 162 mg (09/28/2017), 162 mg (10/05/2017), 162 mg (10/12/2017), 162 mg (10/19/2017), 162 mg (10/26/2017), 132 mg (11/02/2017), 132 mg (11/09/2017), 132 mg (11/16/2017) fosaprepitant (EMEND) 150 mg, dexamethasone (DECADRON) 12 mg in sodium chloride 0.9 % 145 mL IVPB, , Intravenous,  Once, 4 of 4 cycles Administration:  (07/20/2017),  (08/03/2017),  (08/17/2017),  (08/31/2017)  for chemotherapy treatment.    Malignant neoplasm of lower-outer quadrant of left breast of female, estrogen receptor positive ([place]  11/08/2017 Initial Diagnosis   Malignant neoplasm of lower-outer quadrant of left breast of female, estrogen receptor positive [place]    HISTORY OF PRESENTING ILLNESS:   CTISHA CLINE54y.o.  female with a history of stage III ER  PR positive HER-2/neu negative breast cancer currently on Arimidex plus Zoladex is here for  follow-up.  In the interim patient was evaluated by symptomatic management.  Noted to have progressive joint pains.  However patient not taking vitamin D and Osteo Bi-Flex on a regular basis.  However joint pain interrupting her ADLs.  She stopped anastrozole about a month ago.  Slight improvement in the joint pains not whole lot improved.  She has been evaluated by physical therapy for lymphedema.  Review of Systems  Constitutional: Positive for malaise/fatigue. Negative for chills, diaphoresis, fever and weight loss.  HENT: Negative for nosebleeds and sore throat.   Eyes: Negative for double vision.  Respiratory: Negative for cough, hemoptysis, sputum production, shortness of breath and wheezing.   Cardiovascular: Negative for chest pain, palpitations, orthopnea and leg swelling.  Gastrointestinal: Negative for abdominal pain, blood in stool, constipation, diarrhea, heartburn, melena, nausea and vomiting.  Genitourinary: Negative for dysuria, frequency and urgency.  Musculoskeletal: Positive for joint pain. Negative for back pain.  Skin: Negative.  Negative for itching and rash.  Neurological: Positive for tingling. Negative for dizziness, focal weakness, weakness and headaches.  Endo/Heme/Allergies: Does not bruise/bleed easily.  Psychiatric/Behavioral: Negative for depression. The patient is not nervous/anxious and does not have insomnia.      MEDICAL HISTORY:  Past Medical History:  Diagnosis Date  . Allergy   . Breast cancer ([place])   . Cancer ([place]) 06/15/2017   5.1 cm, T3,N1 (clinical): ER/ PR positive, Her 2 neu not overexpressed, High Ki 67. Neuoadjuvant chemotherapy.   . Depression   . Diabetes mellitus without complication ([place]) 3419  . Edema of left upper extremity   . Endometriosis   . Family history of breast cancer   . Headache    migraines  . Hyperlipidemia   . Lymphedema of left arm   . Ovarian mass   . Personal history of chemotherapy   . Personal history of  radiation therapy   . Pneumonia    2018    SURGICAL HISTORY: Past Surgical History:  Procedure Laterality Date  . AXILLARY LYMPH NODE BIOPSY Left 07/14/2017   Procedure: INSERTION GEL MARK CLIP LEFT AXILLA;  Surgeon: Robert Bellow, MD;  Location: [facility] ORS;  Service: General;  Laterality: Left;  . BREAST BIOPSY Left    Dr Orlene Och BREAST METASTATIC CARCINOMA  . BREAST LUMPECTOMY Left 01/12/2018  . COLONOSCOPY WITH PROPOFOL N/A 12/27/2019   Procedure: COLONOSCOPY WITH PROPOFOL;  Surgeon: Virgel Manifold, MD;  Location: [facility] ENDOSCOPY;  Service: Endoscopy;  Laterality: N/A;  . OOPHORECTOMY    . PARTIAL MASTECTOMY WITH NEEDLE LOCALIZATION Left 01/12/2018   Procedure: PARTIAL MASTECTOMY WITH NEEDLE LOCALIZATION;  Surgeon: Robert Bellow, MD;  Location: [facility] ORS;  Service: General;  Laterality: Left;  . PORTACATH PLACEMENT Right 07/14/2017   Procedure: INSERTION PORT-A-CATH;  Surgeon: Robert Bellow, MD;  Location: [facility] ORS;  Service: General;  Laterality: Right;  . SENTINEL NODE BIOPSY Left 01/12/2018   Procedure: SENTINEL NODE BIOPSY;  Surgeon: Robert Bellow, MD;  Location: [facility] ORS;  Service: General;  Laterality: Left;  . TUBAL LIGATION      SOCIAL HISTORY: Social History   Socioeconomic History  . Marital status: Married    Spouse name: Not on file  . Number of children: Not on file  . Years of education: Not on file  . Highest education level: Not on file  Occupational History  . Not on file  Tobacco Use  . Smoking status:  Current Every Day Smoker    Packs/day: 1.00    Years: 11.00    Pack years: 11.00    Types: Cigarettes  . Smokeless tobacco: Former Systems developer    Types: Snuff  Vaping Use  . Vaping Use: Never used  Substance and Sexual Activity  . Alcohol use: No    Alcohol/week: 0.0 standard drinks  . Drug use: No  . Sexual activity: Yes  Other Topics Concern  . Not on file  Social History Narrative  . Not on file   Social Determinants of  Health   Financial Resource Strain: Low Risk   . Difficulty of Paying Living Expenses: Not very hard  Food Insecurity: Not on file  Transportation Needs: Not on file  Physical Activity: Not on file  Stress: Not on file  Social Connections: Not on file  Intimate Partner Violence: Not on file    FAMILY HISTORY: Family History  Problem Relation Age of Onset  . Colon cancer Mother   . Cancer Mother   . Other Father        No info about father or paternal relatives  . Diabetes Brother   . Pancreatitis Brother   . Prostate cancer Brother 29       currently 69 / maternal half-brother  . Breast cancer Maternal Grandmother 40       deceased 63s  . Colon cancer Maternal Grandmother   . Breast cancer Maternal Aunt 65       currently 2  . Breast cancer Other 56       mother's sister; deceased 48  . Breast cancer Other        mother's sister; age at dx unknown    ALLERGIES:  is allergic to aspirin.  MEDICATIONS:  Current Outpatient Medications  Medication Sig Dispense Refill  . Alpha-Lipoic Acid 600 MG CAPS Take 1 capsule (600 mg total) by mouth daily. 30 capsule 2  . aspirin-acetaminophen-caffeine (EXCEDRIN MIGRAINE) 250-250-65 MG tablet Take 2 tablets by mouth daily as needed for headache.     [name] atorvastatin (LIPITOR) 20 MG tablet Take 1 tablet (20 mg total) by mouth daily. 90 tablet 3  . Dulaglutide (TRULICITY) 2.02 RK/2.7CW SOPN Inject 0.75 mg into the skin once a week. 6 mL 4  . DULoxetine (CYMBALTA) 60 MG capsule Take 60 mg by mouth daily.    [name] glucose blood (ONE TOUCH ULTRA TEST) test strip Use up to 4 times/day 100 each 12  . goserelin (ZOLADEX) 3.6 MG injection Inject 3.6 mg into the skin every 28 (twenty-eight) days.    . Insulin Glargine (BASAGLAR KWIKPEN) 100 UNIT/ML Inject 15 Units into the skin at bedtime. 9 mL 4  . Insulin Pen Needle 31G X 8 MM MISC Use daily to administer insulin 100 each 12  . JARDIANCE 25 MG TABS tablet Take 25 mg by mouth daily.    [name] oxyCODONE  (OXY IR/ROXICODONE) 5 MG immediate release tablet Take 1 tablet (5 mg total) by mouth 2 (two) times daily as needed for severe pain. Must last 30 days 60 tablet 0  . pregabalin (LYRICA) 150 MG capsule Take 1 capsule (150 mg total) by mouth 2 (two) times daily. 60 capsule 5  . sertraline (ZOLOFT) 50 MG tablet Take 50 mg by mouth every morning.    . traZODone (DESYREL) 50 MG tablet Take 50 mg by mouth at bedtime.    . ergocalciferol (VITAMIN D2) 1.25 MG (50000 UT) capsule Take 1 capsule (50,000 Units total) by mouth  once a week. 12 capsule 1  . exemestane (AROMASIN) 25 MG tablet Take 1 tablet (25 mg total) by mouth daily after breakfast. 30 tablet 3  . oxyCODONE (OXY IR/ROXICODONE) 5 MG immediate release tablet Take 1 tablet (5 mg total) by mouth 2 (two) times daily as needed for severe pain. Must last 30 days 60 tablet 0  . oxyCODONE (OXY IR/ROXICODONE) 5 MG immediate release tablet Take 1 tablet (5 mg total) by mouth 2 (two) times daily as needed for severe pain. Must last 30 days 60 tablet 0   No current facility-administered medications for this visit.   Facility-Administered Medications Ordered in Other Visits  Medication Dose Route Frequency Provider Last Rate Last Admin  . heparin lock flush 100 unit/mL  500 Units Intravenous Once Corcoran, Melissa C, MD      . heparin lock flush 100 unit/mL  500 Units Intravenous Once Charlaine Dalton R, MD      . sodium chloride flush (NS) 0.9 % injection 10 mL  10 mL Intravenous Once Corcoran, Melissa C, MD      . sodium chloride flush (NS) 0.9 % injection 10 mL  10 mL Intravenous PRN Cammie Sickle, MD   10 mL at 01/22/20 1251      .  PHYSICAL EXAMINATION: ECOG PERFORMANCE STATUS: 0 - Asymptomatic  Vitals:   01/22/20 1317  BP: 107/81  Pulse: 76  Resp: 16  Temp: 97.6 F (36.4 C)  SpO2: 98%   Filed Weights   01/22/20 1317  Weight: 174 lb (78.9 kg)    Physical Exam HENT:     Head: Normocephalic and atraumatic.      Mouth/Throat:     Pharynx: No oropharyngeal exudate.  Eyes:     Pupils: Pupils are equal, round, and reactive to light.  Cardiovascular:     Rate and Rhythm: Normal rate and regular rhythm.  Pulmonary:     Effort: No respiratory distress.     Breath sounds: No wheezing.  Abdominal:     General: Bowel sounds are normal. There is no distension.     Palpations: Abdomen is soft. There is no mass.     Tenderness: There is no abdominal tenderness. There is no guarding or rebound.  Musculoskeletal:        General: No tenderness. Normal range of motion.     Cervical back: Normal range of motion and neck supple.  Skin:    General: Skin is warm.  Neurological:     Mental Status: She is alert and oriented to person, place, and time.  Psychiatric:        Mood and Affect: Affect normal.      LABORATORY DATA:  I have reviewed the data as listed Lab Results  Component Value Date   WBC 6.6 01/22/2020   HGB 15.0 01/22/2020   HCT 45.1 01/22/2020   MCV 95.8 01/22/2020   PLT 215 01/22/2020   Recent Labs    07/19/19 1326 10/18/19 1418 10/21/19 1316 01/22/20 1301  NA 138 138 138 140  K 4.1 4.0 3.6 4.2  CL 102 101 103 102  CO2 _0 GLUCOSE 329* 138* 162* 159*  BUN _1 CREATININE 0.80 0.73 0.60 0.58  CALCIUM 9.1 9.5 8.7* 9.2  GFRNONAA >60 95 >60 >60  GFRAA >60 110 >60  --   PROT 7.1  --  7.2 7.3  ALBUMIN 3.8  --  3.9 3.9  AST 16  --  14*  11*  ALT 17  --  11 10  ALKPHOS 111  --  80 93  BILITOT 0.7  --  0.6 0.8    RADIOGRAPHIC STUDIES: I have personally reviewed the radiological images as listed and agreed with the findings in the report. DG Bone Density  Result Date: 01/21/2020 EXAM: DUAL X-RAY ABSORPTIOMETRY (DXA) FOR BONE MINERAL DENSITY IMPRESSION: Your patient Marillyn Goren completed a BMD test on 01/21/2020 using the [place] (software version: 14.10) manufactured by [employer]. The following summarizes the results of our  evaluation. Technologist: ecj PATIENT BIOGRAPHICAL: Name: Ian, Cavey Patient ID: [id number] Birth Date: [birth year] Height: 64.7 in. Gender: Female Exam Date: 01/21/2020 Weight: 176.8 lbs. Indications: Breast CA, Caucasian, Diabetic, High Risk Meds, History of Breast Cancer, History of Chemo, History of Fracture (Adult), History of Radiation, Hx of Tobacco Use, Oophorectomy Unilateral, Parent Hip Fracture, Postmenopausal, Previous Chemo and Radiation, Tobacco User, Vitamin D Deficiency, Family Hist. (Parent hip fracture), Tobacco User (Current Smoker) Fractures: Facial Bone, Left foot, Left shoulder Treatments: Anastrozole, Jardiance, Trulicity, Zoladex DENSITOMETRY RESULTS: Site      Region     Measured Date Measured Age WHO Classification Young Adult T-score BMD         %Change vs. Previous Significant Change (*) AP Spine L1-L4 01/21/2020 52.4 Normal 0.3 1.230 g/cm2 - - DualFemur Neck Right 01/21/2020 52.4 Normal 0.4 1.089 g/cm2 - - DualFemur Total Mean 01/21/2020 52.4 Normal 0.6 1.081 g/cm2 - - ASSESSMENT: The BMD measured at AP Spine L1-L4 is 1.230 g/cm2 with a T-score of 0.3. This patient is considered normal according to [facility]) criteria. The scan quality is good. World Pharmacologist [facility]) criteria for post-menopausal, Caucasian Women: Normal:                   T-score at or above -1 SD Osteopenia/low bone mass: T-score between -1 and -2.5 SD Osteoporosis:             T-score at or below -2.5 SD RECOMMENDATIONS: 1. All patients should optimize calcium and vitamin D intake. 2. Consider FDA-approved medical therapies in postmenopausal women and men aged 14 years and older, based on the following: a. A hip or vertebral(clinical or morphometric) fracture b. T-score < -2.5 at the femoral neck or spine after appropriate evaluation to exclude secondary causes c. Low bone mass (T-score between -1.0 and -2.5 at the femoral neck or spine) and a 10-year probability of a hip fracture >  3% or a 10-year probability of a major osteoporosis-related fracture > 20% based on the [place]-adapted WHO algorithm 3. Clinician judgment and/or patient preferences may indicate treatment for people with 10-year fracture probabilities above or below these levels FOLLOW-UP: People with diagnosed cases of osteoporosis or at high risk for fracture should have regular bone mineral density tests. For patients eligible for Medicare, routine testing is allowed once every 2 years. The testing frequency can be increased to one year for patients who have rapidly progressing disease, those who are receiving or discontinuing medical therapy to restore bone mass, or have additional risk factors. I have reviewed this report, and agree with the above findings. [facility] Electronically Signed   By: Lowella Grip III M.D.   On: 01/21/2020 10:30    ASSESSMENT & PLAN:   Malignant neoplasm of lower-outer quadrant of left breast of female, estrogen receptor positive ([place]) # breast cancer Stage III ER/PR pos her 2 NEG. currently on anastrazole+ Zoldadex.  No clinical evidence of recurrence. STABLE.Zoladex today.  #Given the poor tolerance to anastrozole [joint pains-multifactorial*] I think is reasonable to switch over to Aromasin.  New prescription started.  Again reviewed the potential side effects.  Continue Zometa every 6 months.  Calcium normal.  # Depression/ Insomnia-stable;  On cymblata/Zoloft-[ Dr.kapur]  # Joint pains-multifactorial [has not completely resolved after stopping anastrozole]-recommend vitamin D 50,000 once a week; recommend compliance with her medications.   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  Stable  #Lymphedema-left chest wall.  Undergoing physical therapy.  Stable  # DM-2 on insulin- STABLE [z1c-6.8]Blood glucose-162;   Defer to PCP  # DISPOSITION: # Zoladex  Today; zometa today.  # Follow up in 3 month;  MD;cbc/cmp/ca-27-29; Zoladex;  port flush.  Dr.B  All questions were answered. The patient knows to call the clinic with any problems, questions or concerns.    Cammie Sickle, MD 01/22/2020 1:41 PM

## 2020-01-23 LAB — CANCER ANTIGEN 27.29: CA 27.29: 15.1 U/mL (ref 0.0–38.6)

## 2020-02-02 NOTE — Progress Notes (Signed)
PROVIDER NOTE: Information contained herein reflects review and annotations entered in association with encounter. Interpretation of such information and data should be left to medically-trained personnel. Information provided to patient can be located elsewhere in the medical record under "Patient Instructions". Document created using STT-dictation technology, any transcriptional errors that may result from process are unintentional.    Patient: Carolyn Roy  Service Category: E/M  Provider: Gaspar Cola, MD  DOB: Feb 17, 1965  DOS: 02/03/2020  Specialty: Interventional Pain Management  MRN: [id number]  Setting: Ambulatory outpatient  PCP: [name], NP  Type: Established Patient    Referring Provider: Guadalupe Maple, MD  Location: Office  Delivery: Face-to-face     HPI  Ms. ALLA SLOMA, a 54 y.o. year old female, is here today because of her Chronic pain syndrome [G89.4]. [name] primary complain today is Knee Pain Last encounter: My last encounter with her was on 09/18/2019. Pertinent problems: [name] has Chronic fatigue; Cancer of midline of breast, left ([place]); Family history of breast cancer; Chemotherapy-induced neuropathy ([place]); Malignant neoplasm of lower-outer quadrant of left breast of female, estrogen receptor positive ([place]); Chronic feet pain (Primary Area of Pain) (Bilateral) (R>L); Chronic knee pain (Secondary Area of Pain) (Bilateral) (R>L); Chronic hand pain (Tertiary Area of Pain) (Bilateral) (R>L); Chronic pain syndrome; Chronic hip pain (Fourth Area of Pain) (Bilateral) (R>L); Chronic upper extremity pain (Tertiary Area of Pain) (Bilateral) (R>L); Cancer-related pain; Osteoarthritis of knee (Right); Chronic low back pain (Bilateral (L>R) w/o sciatica; Chronic hip pain (Left); Lumbar facet syndrome (Bilateral) (L>R); Idiopathic scoliosis; Chronic sacroiliac joint pain (Left); Chronic pain of knee (Right); Abnormal MRI, lumbar spine (04/20/2018); Lumbar facet  arthropathy; Spondylosis without myelopathy or radiculopathy, lumbosacral region; DDD (degenerative disc disease), lumbosacral; Numbness and tingling of upper extremity (C6/C7 dermatomes) (Right); Cervicalgia; and Cervical radiculitis (Right) on their pertinent problem list. Pain Assessment: Severity of Chronic pain is reported as a 6 /10. Location: Knee Right/pain radiaties down to her toes on the right foot. Onset: More than a month ago. Quality: Burning,Aching. Timing: Constant. Modifying factor(s): meds, heating pad, ice gel pack. Vitals:  height is _0  (1.651 m) and weight is 176 lb (79.8 kg). Her temperature is 98 F (36.7 C). Her blood pressure is 118/77 and her pulse is 88. Her oxygen saturation is 100%.   Reason for encounter: medication management. The patient indicates doing well with the current medication regimen. No adverse reactions or side effects reported to the medications. PMP & UDS compliant.  The patient refers that between the alpha-lipoic acid and the pregabalin is seems to have her neuropathy under control.  Today we have updated her PCP since Dr. Janae Bridgeman has retired.   RTCB: 05/05/2020 Nonopioids transfer 02/03/2020: Pregabalin (Lyrica) and alpha-lipoic acid  Pharmacotherapy Assessment   Analgesic: Oxycodone IR 5 mg, 1 tab PO BID (10 mg/day of oxycodone) (Average: 2 tabs/day = 10 mg/day of oxycodone) MME/day: 15 mg/day.   Monitoring: [place] PMP: PDMP reviewed during this encounter.       Pharmacotherapy: No side-effects or adverse reactions reported. Compliance: No problems identified. Effectiveness: Clinically acceptable.  Chauncey Fischer, RN  02/03/2020  1:49 PM  Sign when Signing Visit Nursing Pain Medication Assessment:  Safety precautions to be maintained throughout the outpatient stay will include: orient to surroundings, keep bed in low position, maintain call bell within reach at all times, provide assistance with transfer out of bed and ambulation.  Medication  Inspection Compliance: Pill count conducted under aseptic conditions,  in front of the patient. Neither the pills nor the bottle was removed from the patient's sight at any time. Once count was completed pills were immediately returned to the patient in their original bottle.  Medication: Oxycodone IR Pill/Patch Count: 15 of 60 pills remain Pill/Patch Appearance: Markings consistent with prescribed medication Bottle Appearance: Standard pharmacy container. Clearly labeled. Filled Date: 16 / 3 / 21 Last Medication intake:  TodaySafety precautions to be maintained throughout the outpatient stay will include: orient to surroundings, keep bed in low position, maintain call bell within reach at all times, provide assistance with transfer out of bed and ambulation.     UDS:  Summary  Date Value Ref Range Status  09/18/2019 Note  Final    Comment:    ==================================================================== [employer] [facility] (MW) ==================================================================== Test                             Result       Flag       Units  Drug Present and Declared for Prescription Verification   Oxazepam                       63           EXPECTED   ng/mg creat   Temazepam                      182          EXPECTED   ng/mg creat    Oxazepam and temazepam are expected metabolites of diazepam.    Oxazepam is also an expected metabolite of other benzodiazepine    drugs, including chlordiazepoxide, prazepam, clorazepate, halazepam,    and temazepam.  Oxazepam and temazepam are available as scheduled    prescription medications.    Oxycodone                      1417         EXPECTED   ng/mg creat   Oxymorphone                    545          EXPECTED   ng/mg creat   Noroxycodone                   2620         EXPECTED   ng/mg creat   Noroxymorphone                 188          EXPECTED   ng/mg creat    Sources of oxycodone are scheduled prescription  medications.    Oxymorphone, noroxycodone, and noroxymorphone are expected    metabolites of oxycodone. Oxymorphone is also available as a    scheduled prescription medication.  Drug Present not Declared for Prescription Verification   Alcohol, Ethyl                 0.043        UNEXPECTED g/dL    Sources of ethyl alcohol include alcoholic beverages or as a    fermentation product of glucose; glucose is present in this specimen.    Interpret result with caution, as the presence of ethyl alcohol is    likely due, at least in part, to fermentation of glucose.  ==================================================================== Test  Result    Flag   Units      Ref Range   Creatinine              82               mg/dL      >=20 ==================================================================== Declared Medications:  The flagging and interpretation on this report are based on the  following declared medications.  Unexpected results may arise from  inaccuracies in the declared medications.   **Note: The testing scope of this panel includes these medications:   Oxycodone  Temazepam (Restoril)   **Note: The testing scope of this panel does not include the  following reported medications:   Acetaminophen (Excedrin)  Albuterol (Ventolin HFA)  Anastrozole (Arimidex)  Aspirin (Excedrin)  Atorvastatin (Lipitor)  Caffeine (Excedrin)  Canagliflozin (Invokana)  Duloxetine (Cymbalta)  Hydroxyzine (Vistaril)  Ondansetron (Zofran)  Pregabalin (Lyrica)  Prilocaine (EMLA)  Prochlorperazine (Compazine)  Semaglutide (Ozempic)  Sertraline (Zoloft)  Topical Lidocaine (EMLA) ==================================================================== For clinical consultation, please call [phone]. ====================================================================      ROS  Constitutional: Denies any fever or chills Gastrointestinal: No reported hemesis,  hematochezia, vomiting, or acute GI distress Musculoskeletal: Denies any acute onset joint swelling, redness, loss of ROM, or weakness Neurological: No reported episodes of acute onset apraxia, aphasia, dysarthria, agnosia, amnesia, paralysis, loss of coordination, or loss of consciousness  Medication Review  Alpha-Lipoic Acid, Basaglar KwikPen, DULoxetine, Dulaglutide, Insulin Pen Needle, aspirin-acetaminophen-caffeine, atorvastatin, empagliflozin, ergocalciferol, exemestane, glucose blood, goserelin, oxyCODONE, pregabalin, sertraline, and traZODone  History Review  Allergy: [name] is allergic to aspirin. Drug: [name]  reports no history of drug use. Alcohol:  reports no history of alcohol use. Tobacco:  reports that she has been smoking cigarettes. She has a 11.00 pack-year smoking history. She has quit using smokeless tobacco.  Her smokeless tobacco use included snuff. Social: [name]  reports that she has been smoking cigarettes. She has a 11.00 pack-year smoking history. She has quit using smokeless tobacco.  Her smokeless tobacco use included snuff. She reports that she does not drink alcohol and does not use drugs. Medical:  has a past medical history of Allergy, Breast cancer ([place]), Cancer ([place]) (06/15/2017), Depression, Diabetes mellitus without complication ([place]) (4076), Edema of left upper extremity, Endometriosis, Family history of breast cancer, Headache, Hyperlipidemia, Lymphedema of left arm, Ovarian mass, Personal history of chemotherapy, Personal history of radiation therapy, and Pneumonia. Surgical: [name]  has a past surgical history that includes Oophorectomy; Tubal ligation; Portacath placement (Right, 07/14/2017); Axillary lymph node biopsy (Left, 07/14/2017); Breast lumpectomy (Left, 01/12/2018); Breast biopsy (Left); Partial mastectomy with needle localization (Left, 01/12/2018); Sentinel node biopsy (Left, 01/12/2018); and Colonoscopy with propofol (N/A,  12/27/2019). Family: family history includes Breast cancer in an other family member; Breast cancer (age of onset: 8) in her maternal grandmother; Breast cancer (age of onset: 32) in an other family member; Breast cancer (age of onset: 65) in her maternal aunt; Cancer in her mother; Colon cancer in her maternal grandmother and mother; Diabetes in her brother; Other in her father; Pancreatitis in her brother; Prostate cancer (age of onset: 72) in her brother.  Laboratory Chemistry Profile   Renal Lab Results  Component Value Date   BUN 13 01/22/2020   CREATININE 0.58 01/22/2020   BCR 19 10/18/2019   GFRAA >60 10/21/2019   GFRNONAA >60 01/22/2020     Hepatic Lab Results  Component Value Date   AST 11 (L) 01/22/2020   ALT 10  01/22/2020   ALBUMIN 3.9 01/22/2020   ALKPHOS 93 01/22/2020     Electrolytes Lab Results  Component Value Date   NA 140 01/22/2020   K 4.2 01/22/2020   CL 102 01/22/2020   CALCIUM 9.2 01/22/2020   MG 1.9 11/23/2017     Bone Lab Results  Component Value Date   VD25OH 20.0 (L) 12/13/2017     Inflammation (CRP: Acute Phase) (ESR: Chronic Phase) Lab Results  Component Value Date   CRP 7 01/22/2018   ESRSEDRATE 27 01/22/2018       Note: Above Lab results reviewed.  Recent Imaging Review  DG Bone Density EXAM: DUAL X-RAY ABSORPTIOMETRY (DXA) FOR BONE MINERAL DENSITY  IMPRESSION: Your patient Airen Dales completed a BMD test on 01/21/2020 using the [place] (software version: 14.10) manufactured by [employer]. The following summarizes the results of our evaluation. Technologist: ecj PATIENT BIOGRAPHICAL: Name: Judythe, Postema Patient ID: [id number] Birth Date: 1965/03/29 Height: 64.7 in. Gender: Female Exam Date: 01/21/2020 Weight: 176.8 lbs. Indications: Breast CA, Caucasian, Diabetic, High Risk Meds, History of Breast Cancer, History of Chemo, History of Fracture (Adult), History of Radiation, Hx of Tobacco  Use, Oophorectomy Unilateral, Parent Hip Fracture, Postmenopausal, Previous Chemo and Radiation, Tobacco User, Vitamin D Deficiency, Family Hist. (Parent hip fracture), Tobacco User (Current Smoker) Fractures: Facial Bone, Left foot, Left shoulder Treatments: Anastrozole, Jardiance, Trulicity, Zoladex DENSITOMETRY RESULTS: Site      Region     Measured Date Measured Age WHO Classification Young Adult T-score BMD         %Change vs. Previous Significant Change (*) AP Spine L1-L4 01/21/2020 52.4 Normal 0.3 1.230 g/cm2 - -  DualFemur Neck Right 01/21/2020 52.4 Normal 0.4 1.089 g/cm2 - -  DualFemur Total Mean 01/21/2020 52.4 Normal 0.6 1.081 g/cm2 - - ASSESSMENT: The BMD measured at AP Spine L1-L4 is 1.230 g/cm2 with a T-score of 0.3. This patient is considered normal according to [facility]) criteria. The scan quality is good. [facility]) criteria for post-menopausal, Caucasian Women: Normal:                   T-score at or above -1 SD Osteopenia/low bone mass: T-score between -1 and -2.5 SD Osteoporosis:             T-score at or below -2.5 SD  RECOMMENDATIONS: 1. All patients should optimize calcium and vitamin D intake. 2. Consider FDA-approved medical therapies in postmenopausal women and men aged 27 years and older, based on the following: a. A hip or vertebral(clinical or morphometric) fracture b. T-score < -2.5 at the femoral neck or spine after appropriate evaluation to exclude secondary causes c. Low bone mass (T-score between -1.0 and -2.5 at the femoral neck or spine) and a 10-year probability of a hip fracture > 3% or a 10-year probability of a major osteoporosis-related fracture > 20% based on the [place]-adapted WHO algorithm 3. Clinician judgment and/or patient preferences may indicate treatment for people with 10-year fracture probabilities above or below these levels FOLLOW-UP: People with diagnosed cases of osteoporosis or  at high risk for fracture should have regular bone mineral density tests. For patients eligible for Medicare, routine testing is allowed once every 2 years. The testing frequency can be increased to one year for patients who have rapidly progressing disease, those who are receiving or discontinuing medical therapy to restore bone mass, or have additional risk factors.  I have reviewed this report,  and agree with the above findings.  [facility]  Electronically Signed   By: Lowella Grip III M.D.   On: 01/21/2020 10:30 Note: Reviewed        Physical Exam  General appearance: Well nourished, well developed, and well hydrated. In no apparent acute distress Mental status: Alert, oriented x 3 (person, place, & time)       Respiratory: No evidence of acute respiratory distress Eyes: PERLA Vitals: BP 118/77   Pulse 88   Temp 98 F (36.7 C)   Ht _0  (1.651 m)   Wt 176 lb (79.8 kg)   LMP  (LMP Unknown) Comment: LAST PERIOD IN MAY 2019 WHEN SHE STARTED CHEMO  SpO2 100%   BMI 29.29 kg/m  BMI: Estimated body mass index is 29.29 kg/m as calculated from the following:   Height as of this encounter: _1  (1.651 m).   Weight as of this encounter: 176 lb (79.8 kg). Ideal: Ideal body weight: 57 kg (125 lb 10.6 oz) Adjusted ideal body weight: 66.1 kg (145 lb 12.8 oz)  Assessment   Status Diagnosis  Controlled Controlled Controlled 1. Chronic pain syndrome   2. Chronic feet pain (Primary Area of Pain) (Bilateral) (R>L)   3. Chronic knee pain (Secondary Area of Pain) (Bilateral) (R>L)   4. Chronic hand pain (Tertiary Area of Pain) (Bilateral) (R>L)   5. Chronic hip pain (Fourth Area of Pain) (Bilateral) (R>L)   6. Chronic upper extremity pain (Tertiary Area of Pain) (Bilateral) (R>L)   7. Neuropathic pain   8. Neuropathic pain of feet (Bilateral)      Updated Problems: No problems updated.  Plan of Care  Problem-specific:  No problem-specific Assessment &  Plan notes found for this encounter.  Ms. NIYANNA ASCH has a current medication list which includes the following long-term medication(s): atorvastatin, basaglar kwikpen, sertraline, alpha-lipoic acid, [START ON 02/05/2020] oxycodone, [START ON 03/06/2020] oxycodone, [START ON 04/05/2020] oxycodone, and [START ON 02/05/2020] pregabalin.  Pharmacotherapy (Medications Ordered): Meds ordered this encounter  Medications  . oxyCODONE (OXY IR/ROXICODONE) 5 MG immediate release tablet    Sig: Take 1 tablet (5 mg total) by mouth 2 (two) times daily as needed for severe pain. Must last 30 days    Dispense:  60 tablet    Refill:  0    Chronic Pain: STOP Act (Not applicable) Fill 1 day early if closed on refill date. Avoid benzodiazepines within 8 hours of opioids  . oxyCODONE (OXY IR/ROXICODONE) 5 MG immediate release tablet    Sig: Take 1 tablet (5 mg total) by mouth 2 (two) times daily as needed for severe pain. Must last 30 days    Dispense:  60 tablet    Refill:  0    Chronic Pain: STOP Act (Not applicable) Fill 1 day early if closed on refill date. Avoid benzodiazepines within 8 hours of opioids  . oxyCODONE (OXY IR/ROXICODONE) 5 MG immediate release tablet    Sig: Take 1 tablet (5 mg total) by mouth 2 (two) times daily as needed for severe pain. Must last 30 days    Dispense:  60 tablet    Refill:  0    Chronic Pain: STOP Act (Not applicable) Fill 1 day early if closed on refill date. Avoid benzodiazepines within 8 hours of opioids  . pregabalin (LYRICA) 150 MG capsule    Sig: Take 1 capsule (150 mg total) by mouth 2 (two) times daily.    Dispense:  60 capsule  Refill:  2    Fill one day early if pharmacy is closed on scheduled refill date. Generic permitted. Do not send renewal requests. Void any older duplicate prescription or refill(s) that may be on file.  . Alpha-Lipoic Acid 600 MG CAPS    Sig: Take 1 capsule (600 mg total) by mouth daily.    Dispense:  30 capsule    Refill:  2     Do not place medication on "Automatic Refill". Fill one day early if pharmacy is closed on scheduled refill date.   Orders:  No orders of the defined types were placed in this encounter.  Follow-up plan:   Return in about 3 months (around 05/05/2020) for (F2F), (Med Mgmt).      Considering:   Possible bilateral lumbar facet RFA  Diagnostic lumbar sympathetic block  Diagnostic right genicular NB  Possible right genicular nerve RFA    Palliative PRN treatment(s):   Therapeutic right IA knee joint injection #3 (steroids)  Diagnostic bilateral lumbar facet block #2  Therapeutic/palliative right knee Hyalgan inj. S2/N1        Recent Visits No visits were found meeting these conditions. Showing recent visits within past 90 days and meeting all other requirements Today's Visits Date Type Provider Dept  02/03/20 Office Visit Milinda Pointer, MD [facility]  Showing today's visits and meeting all other requirements Future Appointments No visits were found meeting these conditions. Showing future appointments within next 90 days and meeting all other requirements  I discussed the assessment and treatment plan with the patient. The patient was provided an opportunity to ask questions and all were answered. The patient agreed with the plan and demonstrated an understanding of the instructions.  Patient advised to call back or seek an in-person evaluation if the symptoms or condition worsens.  Duration of encounter: 30 minutes.  Note by: Gaspar Cola, MD Date: 02/03/2020; Time: 2:26 PM

## 2020-02-03 ENCOUNTER — Encounter: Payer: Self-pay | Admitting: Pain Medicine

## 2020-02-03 ENCOUNTER — Ambulatory Visit: Payer: No Typology Code available for payment source | Attending: Pain Medicine | Admitting: Pain Medicine

## 2020-02-03 ENCOUNTER — Other Ambulatory Visit: Payer: Self-pay

## 2020-02-03 VITALS — BP 118/77 | HR 88 | Temp 98.0°F | Ht 65.0 in | Wt 176.0 lb

## 2020-02-03 DIAGNOSIS — G894 Chronic pain syndrome: Secondary | ICD-10-CM | POA: Diagnosis not present

## 2020-02-03 DIAGNOSIS — G5793 Unspecified mononeuropathy of bilateral lower limbs: Secondary | ICD-10-CM | POA: Diagnosis present

## 2020-02-03 DIAGNOSIS — M79601 Pain in right arm: Secondary | ICD-10-CM | POA: Diagnosis present

## 2020-02-03 DIAGNOSIS — M25552 Pain in left hip: Secondary | ICD-10-CM | POA: Diagnosis present

## 2020-02-03 DIAGNOSIS — M79672 Pain in left foot: Secondary | ICD-10-CM | POA: Diagnosis present

## 2020-02-03 DIAGNOSIS — M792 Neuralgia and neuritis, unspecified: Secondary | ICD-10-CM

## 2020-02-03 DIAGNOSIS — M79642 Pain in left hand: Secondary | ICD-10-CM | POA: Diagnosis present

## 2020-02-03 DIAGNOSIS — M25561 Pain in right knee: Secondary | ICD-10-CM | POA: Diagnosis not present

## 2020-02-03 DIAGNOSIS — M25551 Pain in right hip: Secondary | ICD-10-CM | POA: Diagnosis present

## 2020-02-03 DIAGNOSIS — G8929 Other chronic pain: Secondary | ICD-10-CM | POA: Insufficient documentation

## 2020-02-03 DIAGNOSIS — M25562 Pain in left knee: Secondary | ICD-10-CM | POA: Diagnosis present

## 2020-02-03 DIAGNOSIS — M79671 Pain in right foot: Secondary | ICD-10-CM

## 2020-02-03 DIAGNOSIS — M79602 Pain in left arm: Secondary | ICD-10-CM | POA: Insufficient documentation

## 2020-02-03 DIAGNOSIS — M79641 Pain in right hand: Secondary | ICD-10-CM | POA: Diagnosis not present

## 2020-02-03 MED ORDER — OXYCODONE HCL 5 MG PO TABS: 5.0000 mg | ORAL_TABLET | Freq: Two times a day (BID) | ORAL | 0 refills | Status: DC | PRN

## 2020-02-03 MED ORDER — ALPHA-LIPOIC ACID 600 MG PO CAPS: 600.0000 mg | ORAL_CAPSULE | Freq: Every day | ORAL | 2 refills | Status: DC

## 2020-02-03 MED ORDER — PREGABALIN 150 MG PO CAPS: 150.0000 mg | ORAL_CAPSULE | Freq: Two times a day (BID) | ORAL | 2 refills | Status: DC

## 2020-02-03 NOTE — Patient Instructions (Signed)
____________________________________________________________________________________________  Medication Rules  Purpose: To inform patients, and their family members, of our rules and regulations.  Applies to: All patients receiving prescriptions (written or electronic).  Pharmacy of record: Pharmacy where electronic prescriptions will be sent. If written prescriptions are taken to a different pharmacy, please inform the nursing staff. The pharmacy listed in the electronic medical record should be the one where you would like electronic prescriptions to be sent.  Electronic prescriptions: In compliance with the [place] Strengthen Opioid Misuse Prevention (STOP) Act of 2017 (Session Law 2017-74/H243), effective February 07, 2018, all controlled substances must be electronically prescribed. Calling prescriptions to the pharmacy will cease to exist.  Prescription refills: Only during scheduled appointments. Applies to all prescriptions.  NOTE: The following applies primarily to controlled substances (Opioid* Pain Medications).   Type of encounter (visit): For patients receiving controlled substances, face-to-face visits are required. (Not an option or up to the patient.)  Patient's responsibilities: 1. Pain Pills: Bring all pain pills to every appointment (except for procedure appointments). 2. Pill Bottles: Bring pills in original pharmacy bottle. Always bring the newest bottle. Bring bottle, even if empty. 3. Medication refills: You are responsible for knowing and keeping track of what medications you take and those you need refilled. The day before your appointment: write a list of all prescriptions that need to be refilled. The day of the appointment: give the list to the admitting nurse. Prescriptions will be written only during appointments. No prescriptions will be written on procedure days. If you forget a medication: it will not be "Called in", "Faxed", or "electronically sent".  You will need to get another appointment to get these prescribed. No early refills. Do not call asking to have your prescription filled early. 4. Prescription Accuracy: You are responsible for carefully inspecting your prescriptions before leaving our office. Have the discharge nurse carefully go over each prescription with you, before taking them home. Make sure that your name is accurately spelled, that your address is correct. Check the name and dose of your medication to make sure it is accurate. Check the number of pills, and the written instructions to make sure they are clear and accurate. Make sure that you are given enough medication to last until your next medication refill appointment. 5. Taking Medication: Take medication as prescribed. When it comes to controlled substances, taking less pills or less frequently than prescribed is permitted and encouraged. Never take more pills than instructed. Never take medication more frequently than prescribed.  6. Inform other Doctors: Always inform, all of your healthcare providers, of all the medications you take. 7. Pain Medication from other Providers: You are not allowed to accept any additional pain medication from any other Doctor or Healthcare provider. There are two exceptions to this rule. (see below) In the event that you require additional pain medication, you are responsible for notifying us, as stated below. 8. Cough Medicine: Often these contain an opioid, such as codeine or hydrocodone. Never accept or take cough medicine containing these opioids if you are already taking an opioid* medication. The combination may cause respiratory failure and death. 9. Medication Agreement: You are responsible for carefully reading and following our Medication Agreement. This must be signed before receiving any prescriptions from our practice. Safely store a copy of your signed Agreement. Violations to the Agreement will result in no further prescriptions.  (Additional copies of our Medication Agreement are available upon request.) 10. Laws, Rules, & Regulations: All patients are expected to follow all   Federal and State Laws, Statutes, Rules, & Regulations. Ignorance of the Laws does not constitute a valid excuse.  11. Illegal drugs and Controlled Substances: The use of illegal substances (including, but not limited to marijuana and its derivatives) and/or the illegal use of any controlled substances is strictly prohibited. Violation of this rule may result in the immediate and permanent discontinuation of any and all prescriptions being written by our practice. The use of any illegal substances is prohibited. 12. Adopted CDC guidelines & recommendations: Target dosing levels will be at or below 60 MME/day. Use of benzodiazepines** is not recommended.  Exceptions: There are only two exceptions to the rule of not receiving pain medications from other Healthcare Providers. 1. Exception #1 (Emergencies): In the event of an emergency (i.e.: accident requiring emergency care), you are allowed to receive additional pain medication. However, you are responsible for: As soon as you are able, call our office [phone], at any time of the day or night, and leave a message stating your name, the date and nature of the emergency, and the name and dose of the medication prescribed. In the event that your call is answered by a member of our staff, make sure to document and save the date, time, and the name of the person that took your information.  2. Exception #2 (Planned Surgery): In the event that you are scheduled by another doctor or dentist to have any type of surgery or procedure, you are allowed (for a period no longer than 30 days), to receive additional pain medication, for the acute post-op pain. However, in this case, you are responsible for picking up a copy of our "Post-op Pain Management for Surgeons" handout, and giving it to your surgeon or dentist. This  document is available at our office, and does not require an appointment to obtain it. Simply go to our office during business hours (Monday-Thursday from 8:00 AM to 4:00 PM) (Friday 8:00 AM to 12:00 Noon) or if you have a scheduled appointment with us, prior to your surgery, and ask for it by name. In addition, you are responsible for: calling our office [phone], at any time of the day or night, and leaving a message stating your name, name of your surgeon, type of surgery, and date of procedure or surgery. Failure to comply with your responsibilities may result in termination of therapy involving the controlled substances.  *Opioid medications include: morphine, codeine, oxycodone, oxymorphone, hydrocodone, hydromorphone, meperidine, tramadol, tapentadol, buprenorphine, fentanyl, methadone. **Benzodiazepine medications include: diazepam (Valium), alprazolam (Xanax), clonazepam (Klonopine), lorazepam (Ativan), clorazepate (Tranxene), chlordiazepoxide (Librium), estazolam (Prosom), oxazepam (Serax), temazepam (Restoril), triazolam (Halcion) (Last updated: 01/06/2020) ____________________________________________________________________________________________   ____________________________________________________________________________________________  Medication Recommendations and Reminders  Applies to: All patients receiving prescriptions (written and/or electronic).  Medication Rules & Regulations: These rules and regulations exist for your safety and that of others. They are not flexible and neither are we. Dismissing or ignoring them will be considered "non-compliance" with medication therapy, resulting in complete and irreversible termination of such therapy. (See document titled "Medication Rules" for more details.) In all conscience, because of safety reasons, we cannot continue providing a therapy where the patient does not follow instructions.  Pharmacy of record:   Definition:  This is the pharmacy where your electronic prescriptions will be sent.   We do not endorse any particular pharmacy, however, we have experienced problems with [facility] not securing enough medication supply for the community.  We do not restrict you in your choice of pharmacy. However,   once we write for your prescriptions, we will NOT be re-sending more prescriptions to fix restricted supply problems created by your pharmacy, or your insurance.   The pharmacy listed in the electronic medical record should be the one where you want electronic prescriptions to be sent.  If you choose to change pharmacy, simply notify our nursing staff.  Recommendations:  Keep all of your pain medications in a safe place, under lock and key, even if you live alone. We will NOT replace lost, stolen, or damaged medication.  After you fill your prescription, take 1 week's worth of pills and put them away in a safe place. You should keep a separate, properly labeled bottle for this purpose. The remainder should be kept in the original bottle. Use this as your primary supply, until it runs out. Once it's gone, then you know that you have 1 week's worth of medicine, and it is time to come in for a prescription refill. If you do this correctly, it is unlikely that you will ever run out of medicine.  To make sure that the above recommendation works, it is very important that you make sure your medication refill appointments are scheduled at least 1 week before you run out of medicine. To do this in an effective manner, make sure that you do not leave the office without scheduling your next medication management appointment. Always ask the nursing staff to show you in your prescription , when your medication will be running out. Then arrange for the receptionist to get you a return appointment, at least 7 days before you run out of medicine. Do not wait until you have 1 or 2 pills left, to come in. This is very poor planning and  does not take into consideration that we may need to cancel appointments due to bad weather, sickness, or emergencies affecting our staff.  DO NOT ACCEPT A "Partial Fill": If for any reason your pharmacy does not have enough pills/tablets to completely fill or refill your prescription, do not allow for a "partial fill". The law allows the pharmacy to complete that prescription within 72 hours, without requiring a new prescription. If they do not fill the rest of your prescription within those 72 hours, you will need a separate prescription to fill the remaining amount, which we will NOT provide. If the reason for the partial fill is your insurance, you will need to talk to the pharmacist about payment alternatives for the remaining tablets, but again, DO NOT ACCEPT A PARTIAL FILL, unless you can trust your pharmacist to obtain the remainder of the pills within 72 hours.  Prescription refills and/or changes in medication(s):   Prescription refills, and/or changes in dose or medication, will be conducted only during scheduled medication management appointments. (Applies to both, written and electronic prescriptions.)  No refills on procedure days. No medication will be changed or started on procedure days. No changes, adjustments, and/or refills will be conducted on a procedure day. Doing so will interfere with the diagnostic portion of the procedure.  No phone refills. No medications will be "called into the pharmacy".  No Fax refills.  No weekend refills.  No Holliday refills.  No after hours refills.  Remember:  Business hours are:  Monday to Thursday 8:00 AM to 4:00 PM Provider's Schedule: Benelli Winther, MD - Appointments are:  Medication management: Monday and Wednesday 8:00 AM to 4:00 PM Procedure day: Tuesday and Thursday 7:30 AM to 4:00 PM Bilal Lateef, MD - Appointments are:    Medication management: Tuesday and Thursday 8:00 AM to 4:00 PM Procedure day: Monday and Wednesday  7:30 AM to 4:00 PM (Last update: 08/28/2019) ____________________________________________________________________________________________   ____________________________________________________________________________________________  CBD (cannabidiol) WARNING  Applicable to: All individuals currently taking or considering taking CBD (cannabidiol) and, more important, all patients taking opioid analgesic controlled substances (pain medication). (Example: oxycodone; oxymorphone; hydrocodone; hydromorphone; morphine; methadone; tramadol; tapentadol; fentanyl; buprenorphine; butorphanol; dextromethorphan; meperidine; codeine; etc.)  Legal status: CBD remains a Schedule I drug prohibited for any use. CBD is illegal with one exception. In the [place], CBD has a limited Food and Drug Administration (FDA) approval for the treatment of two specific types of epilepsy disorders. Only one CBD product has been approved by the FDA for this purpose: "Epidiolex". FDA is aware that some companies are marketing products containing cannabis and cannabis-derived compounds in ways that violate the Federal Food, Drug and Cosmetic Act (FD&C Act) and that may put the health and safety of consumers at risk. The FDA, a Federal agency, has not enforced the CBD status since 2018.   Legality: Some manufacturers ship CBD products nationally, which is illegal. Often such products are sold online and are therefore available throughout the country. CBD is openly sold in head shops and health food stores in some states where such sales have not been explicitly legalized. Selling unapproved products with unsubstantiated therapeutic claims is not only a violation of the law, but also can put patients at risk, as these products have not been proven to be safe or effective. Federal illegality makes it difficult to conduct research on CBD.  Reference: "FDA Regulation of Cannabis and Cannabis-Derived Products, Including Cannabidiol  (CBD)" - https://www.fda.gov/news-events/public-health-focus/fda-regulation-cannabis-and-cannabis-derived-products-including-cannabidiol-cbd  Warning: CBD is not FDA approved and has not undergo the same manufacturing controls as prescription drugs.  This means that the purity and safety of available CBD may be questionable. Most of the time, despite manufacturer's claims, it is contaminated with THC (delta-9-tetrahydrocannabinol - the chemical in marijuana responsible for the "HIGH").  When this is the case, the THC contaminant will trigger a positive urine drug screen (UDS) test for Marijuana (carboxy-THC). Because a positive UDS for any illicit substance is a violation of our medication agreement, your opioid analgesics (pain medicine) may be permanently discontinued.  MORE ABOUT CBD  General Information: CBD  is a derivative of the Marijuana (cannabis sativa) plant discovered in 1940. It is one of the 113 identified substances found in Marijuana. It accounts for up to 40% of the plant's extract. As of 2018, preliminary clinical studies on CBD included research for the treatment of anxiety, movement disorders, and pain. CBD is available and consumed in multiple forms, including inhalation of smoke or vapor, as an aerosol spray, and by mouth. It may be supplied as an oil containing CBD, capsules, dried cannabis, or as a liquid solution. CBD is thought not to be as psychoactive as THC (delta-9-tetrahydrocannabinol - the chemical in marijuana responsible for the "HIGH"). Studies suggest that CBD may interact with different biological target receptors in the body, including cannabinoid and other neurotransmitter receptors. As of 2018 the mechanism of action for its biological effects has not been determined.  Side-effects  Adverse reactions: Dry mouth, diarrhea, decreased appetite, fatigue, drowsiness, malaise, weakness, sleep disturbances, and others.  Drug interactions: CBC may interact with other  medications such as blood-thinners. (Last update: 09/14/2019) ____________________________________________________________________________________________    

## 2020-02-03 NOTE — Progress Notes (Signed)
Nursing Pain Medication Assessment:  Safety precautions to be maintained throughout the outpatient stay will include: orient to surroundings, keep bed in low position, maintain call bell within reach at all times, provide assistance with transfer out of bed and ambulation.  Medication Inspection Compliance: Pill count conducted under aseptic conditions, in front of the patient. Neither the pills nor the bottle was removed from the patient's sight at any time. Once count was completed pills were immediately returned to the patient in their original bottle.  Medication: Oxycodone IR Pill/Patch Count: 15 of 60 pills remain Pill/Patch Appearance: Markings consistent with prescribed medication Bottle Appearance: Standard pharmacy container. Clearly labeled. Filled Date: 51 / 3 / 21 Last Medication intake:  TodaySafety precautions to be maintained throughout the outpatient stay will include: orient to surroundings, keep bed in low position, maintain call bell within reach at all times, provide assistance with transfer out of bed and ambulation.

## 2020-02-20 ENCOUNTER — Other Ambulatory Visit: Payer: Self-pay | Admitting: Nurse Practitioner

## 2020-04-17 ENCOUNTER — Other Ambulatory Visit: Payer: Self-pay | Admitting: Internal Medicine

## 2020-04-22 ENCOUNTER — Inpatient Hospital Stay: Payer: No Typology Code available for payment source

## 2020-04-22 ENCOUNTER — Inpatient Hospital Stay (HOSPITAL_BASED_OUTPATIENT_CLINIC_OR_DEPARTMENT_OTHER): Payer: No Typology Code available for payment source | Admitting: Internal Medicine

## 2020-04-22 ENCOUNTER — Other Ambulatory Visit: Payer: Self-pay

## 2020-04-22 ENCOUNTER — Inpatient Hospital Stay: Payer: No Typology Code available for payment source | Attending: Internal Medicine

## 2020-04-22 VITALS — BP 103/81 | HR 72 | Temp 97.6°F | Resp 20 | Ht 65.0 in | Wt 174.8 lb

## 2020-04-22 DIAGNOSIS — Z9221 Personal history of antineoplastic chemotherapy: Secondary | ICD-10-CM | POA: Insufficient documentation

## 2020-04-22 DIAGNOSIS — Z95828 Presence of other vascular implants and grafts: Secondary | ICD-10-CM

## 2020-04-22 DIAGNOSIS — M2550 Pain in unspecified joint: Secondary | ICD-10-CM | POA: Diagnosis not present

## 2020-04-22 DIAGNOSIS — E785 Hyperlipidemia, unspecified: Secondary | ICD-10-CM | POA: Insufficient documentation

## 2020-04-22 DIAGNOSIS — Z90721 Acquired absence of ovaries, unilateral: Secondary | ICD-10-CM | POA: Insufficient documentation

## 2020-04-22 DIAGNOSIS — F32A Depression, unspecified: Secondary | ICD-10-CM | POA: Insufficient documentation

## 2020-04-22 DIAGNOSIS — M549 Dorsalgia, unspecified: Secondary | ICD-10-CM | POA: Insufficient documentation

## 2020-04-22 DIAGNOSIS — Z886 Allergy status to analgesic agent status: Secondary | ICD-10-CM | POA: Insufficient documentation

## 2020-04-22 DIAGNOSIS — I890 Lymphedema, not elsewhere classified: Secondary | ICD-10-CM | POA: Insufficient documentation

## 2020-04-22 DIAGNOSIS — R5383 Other fatigue: Secondary | ICD-10-CM | POA: Diagnosis not present

## 2020-04-22 DIAGNOSIS — F17210 Nicotine dependence, cigarettes, uncomplicated: Secondary | ICD-10-CM | POA: Diagnosis not present

## 2020-04-22 DIAGNOSIS — C50512 Malignant neoplasm of lower-outer quadrant of left female breast: Secondary | ICD-10-CM | POA: Diagnosis not present

## 2020-04-22 DIAGNOSIS — G4700 Insomnia, unspecified: Secondary | ICD-10-CM | POA: Insufficient documentation

## 2020-04-22 DIAGNOSIS — Z170 Estrogen receptor positive status [ER+]: Secondary | ICD-10-CM | POA: Insufficient documentation

## 2020-04-22 DIAGNOSIS — Z8379 Family history of other diseases of the digestive system: Secondary | ICD-10-CM | POA: Diagnosis not present

## 2020-04-22 DIAGNOSIS — E119 Type 2 diabetes mellitus without complications: Secondary | ICD-10-CM | POA: Insufficient documentation

## 2020-04-22 DIAGNOSIS — Z5111 Encounter for antineoplastic chemotherapy: Secondary | ICD-10-CM | POA: Insufficient documentation

## 2020-04-22 DIAGNOSIS — Z79899 Other long term (current) drug therapy: Secondary | ICD-10-CM | POA: Insufficient documentation

## 2020-04-22 DIAGNOSIS — Z833 Family history of diabetes mellitus: Secondary | ICD-10-CM | POA: Insufficient documentation

## 2020-04-22 DIAGNOSIS — Z79811 Long term (current) use of aromatase inhibitors: Secondary | ICD-10-CM | POA: Diagnosis not present

## 2020-04-22 DIAGNOSIS — Z803 Family history of malignant neoplasm of breast: Secondary | ICD-10-CM | POA: Insufficient documentation

## 2020-04-22 DIAGNOSIS — Z923 Personal history of irradiation: Secondary | ICD-10-CM | POA: Diagnosis not present

## 2020-04-22 DIAGNOSIS — Z800 Family history of malignant neoplasm of digestive organs: Secondary | ICD-10-CM | POA: Insufficient documentation

## 2020-04-22 LAB — CBC WITH DIFFERENTIAL/PLATELET
Abs Immature Granulocytes: 0.02 10*3/uL (ref 0.00–0.07)
Basophils Absolute: 0 10*3/uL (ref 0.0–0.1)
Basophils Relative: 0 %
Eosinophils Absolute: 0.1 10*3/uL (ref 0.0–0.5)
Eosinophils Relative: 1 %
HCT: 41.9 % (ref 36.0–46.0)
Hemoglobin: 13.9 g/dL (ref 12.0–15.0)
Immature Granulocytes: 0 %
Lymphocytes Relative: 23 %
Lymphs Abs: 1.7 10*3/uL (ref 0.7–4.0)
MCH: 31.8 pg (ref 26.0–34.0)
MCHC: 33.2 g/dL (ref 30.0–36.0)
MCV: 95.9 fL (ref 80.0–100.0)
Monocytes Absolute: 0.4 10*3/uL (ref 0.1–1.0)
Monocytes Relative: 6 %
Neutro Abs: 5.1 10*3/uL (ref 1.7–7.7)
Neutrophils Relative %: 70 %
Platelets: 211 10*3/uL (ref 150–400)
RBC: 4.37 MIL/uL (ref 3.87–5.11)
RDW: 13.6 % (ref 11.5–15.5)
WBC: 7.3 10*3/uL (ref 4.0–10.5)
nRBC: 0 % (ref 0.0–0.2)

## 2020-04-22 LAB — COMPREHENSIVE METABOLIC PANEL
ALT: 11 U/L (ref 0–44)
AST: 13 U/L — ABNORMAL LOW (ref 15–41)
Albumin: 3.8 g/dL (ref 3.5–5.0)
Alkaline Phosphatase: 80 U/L (ref 38–126)
Anion gap: 9 (ref 5–15)
BUN: 11 mg/dL (ref 6–20)
CO2: 25 mmol/L (ref 22–32)
Calcium: 8.7 mg/dL — ABNORMAL LOW (ref 8.9–10.3)
Chloride: 104 mmol/L (ref 98–111)
Creatinine, Ser: 0.63 mg/dL (ref 0.44–1.00)
GFR, Estimated: >60 mL/min (ref >60)
Glucose, Bld: 231 mg/dL — ABNORMAL HIGH (ref 70–99)
Potassium: 3.4 mmol/L — ABNORMAL LOW (ref 3.5–5.1)
Sodium: 138 mmol/L (ref 135–145)
Total Bilirubin: 0.7 mg/dL (ref 0.3–1.2)
Total Protein: 6.8 g/dL (ref 6.5–8.1)

## 2020-04-22 MED ORDER — HEPARIN SOD (PORK) LOCK FLUSH 100 UNIT/ML IV SOLN
500.0000 [IU] | Freq: Once | INTRAVENOUS | Status: AC
  Administered 2020-04-22: 500 [IU] via INTRAVENOUS
  Filled 2020-04-22: qty 5

## 2020-04-22 MED ORDER — SODIUM CHLORIDE 0.9% FLUSH
10.0000 mL | Freq: Once | INTRAVENOUS | Status: AC
  Administered 2020-04-22: 10 mL via INTRAVENOUS
  Filled 2020-04-22: qty 10

## 2020-04-22 MED ORDER — GOSERELIN ACETATE 10.8 MG ~~LOC~~ IMPL
10.8000 mg | DRUG_IMPLANT | Freq: Once | SUBCUTANEOUS | Status: AC
  Administered 2020-04-22: 10.8 mg via SUBCUTANEOUS
  Filled 2020-04-22: qty 10.8

## 2020-04-22 MED ORDER — TAMOXIFEN CITRATE 10 MG PO TABS: 10.0000 mg | ORAL_TABLET | Freq: Every day | ORAL | 1 refills | Status: DC

## 2020-04-22 NOTE — Progress Notes (Signed)
[facility] CONSULT NOTE  Patient Care Team: [name], NP as PCP - General (Nurse Practitioner) Bary Castilla [name], MD (General Surgery) [name], [name], [name] (Obstetrics and Gynecology) Cammie Sickle, MD as Consulting Physician (Oncology) Vanita Ingles, RN as Case Manager (General Practice)  CHIEF COMPLAINTS/PURPOSE OF CONSULTATION: Breast cancer  Oncology History Overview Note  # MAY 2019-  clinical stage IIIA (T3N1Mx) left breast cancer s/p biopsy on 06/14/2017. -Pathology revealed grade III invasive ductal carcinoma. -Axillary FNA revealed malignant cells c/w metastatic carcinoma. Tumor was ER + (90%), PR + (30%), Her2/neu - and Ki67 70%.  CA27.29 was 7.8 on 06/14/2017.  # She received 4 cycles of AC with Neulasta support (07/20/2017 - 08/31/2017).;  neoadjuvant Taxol on 09/14/2017.  #DEC 2019- Lumpectomy/sentinel lymph node biopsy [Dr.[name]]-complete pathologic response  # s/p RT [delayed sec to wound infection; Dr.[name]] finished RT [4/12]  # April 14th 2020- START TAM; stopped in mid-May secondary intolerance [severe migraines].  # 18th May 2020-start Arimidex [hormonal profile-postmenopausal;add Zoladex q3M]; STOPPED sec intolerance/joint pain; NOV 2021- STARTED AROMASIN; Stopped x 2 months sec to extreme fatigue/severe joint pains.   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 Secord/Berchuck]-negative for malignancy/ # DM-2- poorly controlled.   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   # PAP SMEAR- RECOMMENDED 2022- summer  -------------------------------------------  DIAGNOSIS: left breast cancer  STAGE:  III       ;GOALS: cure  CURRENT/MOST RECENT THERAPY Tam    Cancer of midline of breast, left ([facility])  06/15/2017 Initial Diagnosis   Cancer of midline of breast, left ([employer])    07/20/2017 - 11/16/2017 Chemotherapy   The patient had dexamethasone (DECADRON) 4 MG tablet, 1 of 1 cycle, Start date: --, End date: -- DOXOrubicin (ADRIAMYCIN) chemo injection 122 mg, 60 mg/m2 = 122 mg, Intravenous,  Once, 4 of 4 cycles Administration: 122 mg (07/20/2017), 122 mg (08/03/2017), 122 mg (08/17/2017), 122 mg (08/31/2017) palonosetron (ALOXI) injection 0.25 mg, 0.25 mg, Intravenous,  Once, 4 of 4 cycles Administration: 0.25 mg (07/20/2017), 0.25 mg (08/03/2017), 0.25 mg (08/17/2017), 0.25 mg (08/31/2017) pegfilgrastim (NEULASTA) injection 6 mg, 6 mg, Subcutaneous, Once, 5 of 5 cycles Administration: 6 mg (07/21/2017), 6 mg (08/04/2017), 6 mg (08/18/2017), 6 mg (09/01/2017) cyclophosphamide (CYTOXAN) 1,220 mg in sodium chloride 0.9 % 250 mL chemo infusion, 600 mg/m2 = 1,220 mg, Intravenous,  Once, 4 of 4 cycles Administration: 1,220 mg (07/20/2017), 1,220 mg (08/03/2017), 1,220 mg (08/17/2017), 1,220 mg (08/31/2017) PACLitaxel (TAXOL) 162 mg in sodium chloride 0.9 % 250 mL chemo infusion (</= 26m/m2), 80 mg/m2 = 162 mg, Intravenous,  Once, 10 of 12 cycles Dose modification: 65 mg/m2 (original dose 80 mg/m2, Cycle 13, Reason: Provider Judgment, Comment: neuropathy) Administration: 162 mg (09/14/2017), 162 mg (09/21/2017), 162 mg (09/28/2017), 162 mg (10/05/2017), 162 mg (10/12/2017), 162 mg (10/19/2017), 162 mg (10/26/2017), 132 mg (11/02/2017), 132 mg (11/09/2017), 132 mg (11/16/2017) fosaprepitant (EMEND) 150 mg, dexamethasone (DECADRON) 12 mg in sodium chloride 0.9 % 145 mL IVPB, , Intravenous,  Once, 4 of 4 cycles Administration:  (07/20/2017),  (08/03/2017),  (08/17/2017),  (08/31/2017)  for chemotherapy treatment.    Malignant neoplasm of lower-outer quadrant of left breast of female, estrogen receptor positive ([place]  11/08/2017 Initial Diagnosis   Malignant neoplasm of lower-outer quadrant of left breast of female, estrogen receptor positive ([place]    HISTORY  OF PRESENTING ILLNESS:   Carolyn Roy 55  y.o.  female with a history of stage III ER PR positive HER-2/neu negative breast cancer currently on Aromasin plus Zoladex is here for follow-up.  Patient stopped taking Aromasin approximately 1 month after starting.  Patient complains of bilateral lower extremity cramps significant/severe joint pains unable to go to work.  Patient symptoms are significantly improved after stopping Aromasin.   Denies any new lumps or bumps.  Review of Systems  Constitutional: Positive for malaise/fatigue. Negative for chills, diaphoresis, fever and weight loss.  HENT: Negative for nosebleeds and sore throat.   Eyes: Negative for double vision.  Respiratory: Negative for cough, hemoptysis, sputum production, shortness of breath and wheezing.   Cardiovascular: Negative for chest pain, palpitations, orthopnea and leg swelling.  Gastrointestinal: Negative for abdominal pain, blood in stool, constipation, diarrhea, heartburn, melena, nausea and vomiting.  Genitourinary: Negative for dysuria, frequency and urgency.  Musculoskeletal: Positive for back pain and joint pain.  Skin: Negative.  Negative for itching and rash.  Neurological: Positive for tingling. Negative for dizziness, focal weakness, weakness and headaches.  Endo/Heme/Allergies: Does not bruise/bleed easily.  Psychiatric/Behavioral: Negative for depression. The patient is not nervous/anxious and does not have insomnia.      MEDICAL HISTORY:  Past Medical History:  Diagnosis Date  . Allergy   . Breast cancer ([place])   . Cancer ([place]) 06/15/2017   5.1 cm, T3,N1 (clinical): ER/ PR positive, Her 2 neu not overexpressed, High Ki 67. Neuoadjuvant chemotherapy.   . Depression   . Diabetes mellitus without complication ([place]) 2336  . Edema of left upper extremity   . Endometriosis   . Family history of breast cancer   . Headache    migraines  . Hyperlipidemia   . Lymphedema of left arm   . Ovarian mass   . Personal history of chemotherapy   .  Personal history of radiation therapy   . Pneumonia    2018    SURGICAL HISTORY: Past Surgical History:  Procedure Laterality Date  . AXILLARY LYMPH NODE BIOPSY Left 07/14/2017   Procedure: INSERTION GEL MARK CLIP LEFT AXILLA;  Surgeon: Robert Bellow, MD;  Location: [facility] ORS;  Service: General;  Laterality: Left;  . BREAST BIOPSY Left    Dr Orlene Och BREAST METASTATIC CARCINOMA  . BREAST LUMPECTOMY Left 01/12/2018  . COLONOSCOPY WITH PROPOFOL N/A 12/27/2019   Procedure: COLONOSCOPY WITH PROPOFOL;  Surgeon: Virgel Manifold, MD;  Location: [facility] END[facility]COPY;  Service: Endoscopy;  Laterality: N/A;  . OOPHORECTOMY    . PARTIAL MASTECTOMY WITH NEEDLE LOCALIZATION Left 01/12/2018   Procedure: PARTIAL MASTECTOMY WITH NEEDLE LOCALIZATION;  Surgeon: Robert Bellow, MD;  Location: [facility] ORS;  Service: General;  Laterality: Left;  . PORTACATH PLACEMENT Right 07/14/2017   Procedure: INSERTION PORT-A-CATH;  Surgeon: Robert Bellow, MD;  Location: [facility] ORS;  Service: General;  Laterality: Right;  . SENTINEL NODE BIOPSY Left 01/12/2018   Procedure: SENTINEL NODE BIOPSY;  Surgeon: Robert Bellow, MD;  Location: [facility] OR[facility];  Service: General;  Laterality: Left;  . TUBAL LIGATION      SOCIAL HISTORY: Social History   Socioeconomic History  . Marital status: Married    Spouse name: Not on file  . Number of children: Not on file  . Years of education: Not on file  . Highest education level: Not on file  Occupational History  . Not on file  Tobacco Use  . Smoking status: Current Every Day Smoker  Packs/day: 1.00    Years: 11.00    Pack years: 11.00    Types: Cigarettes  . Smokeless tobacco: Former Systems developer    Types: Snuff  Vaping Use  . Vaping Use: Never used  Substance and Sexual Activity  . Alcohol use: No    Alcohol/week: 0.0 standard drinks  . Drug use: No  . Sexual activity: Yes  Other Topics Concern  . Not on file  Social History Narrative  . Not on file   Social  Determinants of Health   Financial Resource Strain: Low Risk   . Difficulty of Paying Living Expenses: Not very hard  Food Insecurity: Not on file  Transportation Needs: Not on file  Physical Activity: Not on file  Stress: Not on file  Social Connections: Not on file  Intimate Partner Violence: Not on file    FAMILY HISTORY: Family History  Problem Relation Age of Onset  . Colon cancer Mother   . Cancer Mother   . Other Father        No info about father or paternal relatives  . Diabetes Brother   . Pancreatitis Brother   . Prostate cancer Brother 66       currently 17 / maternal half-brother  . Breast cancer Maternal Grandmother 40       deceased 23s  . Colon cancer Maternal Grandmother   . Breast cancer Maternal Aunt 65       currently 73  . Breast cancer Other 51       mother's sister; deceased 73  . Breast cancer Other        mother's sister; age at dx unknown    ALLERGIES:  is allergic to aspirin.  MEDICATIONS:  Current Outpatient Medications  Medication Sig Dispense Refill  . Alpha-Lipoic Acid 600 MG CAPS Take 1 capsule (600 mg total) by mouth daily. 30 capsule 2  . aspirin-acetaminophen-caffeine (EXCEDRIN MIGRAINE) 250-250-65 MG tablet Take 2 tablets by mouth daily as needed for headache.     [name] atorvastatin (LIPITOR) 20 MG tablet Take 1 tablet (20 mg total) by mouth daily. 90 tablet 3  . Dulaglutide (TRULICITY) 3.79 KF/2.7MD SOPN Inject 0.75 mg into the skin once a week. 6 mL 4  . DULoxetine (CYMBALTA) 60 MG capsule Take 60 mg by mouth daily.    [name] glucose blood (ONE TOUCH ULTRA TEST) test strip Use up to 4 times/day 100 each 12  . goserelin (ZOLADEX) 3.6 MG injection Inject 3.6 mg into the skin every 28 (twenty-eight) days.    . Insulin Glargine (BASAGLAR KWIKPEN) 100 UNIT/ML INJECT 10 UNITS TOTAL INTO THE SKIN AT BEDTIME. 15 mL 2  . Insulin Pen Needle 31G X 8 MM MISC Use daily to administer insulin 100 each 12  . JARDIANCE 25 MG TABS tablet Take 25 mg by mouth  daily.    [name] oxyCODONE (OXY IR/ROXICODONE) 5 MG immediate release tablet Take 1 tablet (5 mg total) by mouth 2 (two) times daily as needed for severe pain. Must last 30 days 60 tablet 0  . pregabalin (LYRICA) 150 MG capsule Take 1 capsule (150 mg total) by mouth 2 (two) times daily. 60 capsule 2  . sertraline (ZOLOFT) 50 MG tablet Take 50 mg by mouth every morning.    . tamoxifen (NOLVADEX) 10 MG tablet Take 1 tablet (10 mg total) by mouth daily. 90 tablet 1  . traZODone (DESYREL) 50 MG tablet Take 50 mg by mouth at bedtime as needed.    . ergocalciferol (  VITAMIN D2) 1.25 MG (50000 UT) capsule Take 1 capsule (50,000 Units total) by mouth once a week. (Patient not taking: Reported on 04/22/2020) 12 capsule 1  . exemestane (AROMASIN) 25 MG tablet TAKE 1 TABLET (25 MG TOTAL) BY MOUTH DAILY AFTER BREAKFAST. (Patient not taking: Reported on 04/22/2020) 90 tablet 0  . oxyCODONE (OXY IR/ROXICODONE) 5 MG immediate release tablet Take 1 tablet (5 mg total) by mouth 2 (two) times daily as needed for severe pain. Must last 30 days 60 tablet 0  . oxyCODONE (OXY IR/ROXICODONE) 5 MG immediate release tablet Take 1 tablet (5 mg total) by mouth 2 (two) times daily as needed for severe pain. Must last 30 days 60 tablet 0   No current facility-administered medications for this visit.   Facility-Administered Medications Ordered in Other Visits  Medication Dose Route Frequency Provider Last Rate Last Admin  . heparin lock flush 100 unit/mL  500 Units Intravenous Once Corcoran, Melissa C, MD      . sodium chloride flush (NS) 0.9 % injection 10 mL  10 mL Intravenous Once Lequita Asal, MD          .  PHYSICAL EXAMINATION: ECOG PERFORMANCE STATUS: 0 - Asymptomatic  Vitals:   04/22/20 1358  BP: 103/81  Pulse: 72  Resp: 20  Temp: 97.6 F (36.4 C)   Filed Weights   04/22/20 1358  Weight: 174 lb 12.8 oz (79.3 kg)    Physical Exam HENT:     Head: Normocephalic and atraumatic.     Mouth/Throat:      Pharynx: No oropharyngeal exudate.  Eyes:     Pupils: Pupils are equal, round, and reactive to light.  Cardiovascular:     Rate and Rhythm: Normal rate and regular rhythm.  Pulmonary:     Effort: No respiratory distress.     Breath sounds: No wheezing.  Abdominal:     General: Bowel sounds are normal. There is no distension.     Palpations: Abdomen is soft. There is no mass.     Tenderness: There is no abdominal tenderness. There is no guarding or rebound.  Musculoskeletal:        General: No tenderness. Normal range of motion.     Cervical back: Normal range of motion and neck supple.  Skin:    General: Skin is warm.     Comments: Right and left BREAST exam (in the presence of nurse)- no unusual skin changes or dominant masses felt. Surgical scars noted.    Neurological:     Mental Status: She is alert and oriented to person, place, and time.  Psychiatric:        Mood and Affect: Affect normal.      LABORATORY DATA:  I have reviewed the data as listed Lab Results  Component Value Date   WBC 7.3 04/22/2020   HGB 13.9 04/22/2020   HCT 41.9 04/22/2020   MCV 95.9 04/22/2020   PLT 211 04/22/2020   Recent Labs    07/19/19 1326 10/18/19 1418 10/21/19 1316 01/22/20 1301 04/22/20 1345  NA 138 138 138 140 138  K 4.1 4.0 3.6 4.2 3.4*  CL 102 101 103 102 104  CO2 '27 23 26 28 25  ' GLUCOSE 329* 138* 162* 159* 231*  BUN '10 14 13 13 11  ' CREATININE 0.80 0.73 0.60 0.58 0.63  CALCIUM 9.1 9.5 8.7* 9.2 8.7*  GFRNONAA >60 95 >60 >60 >60  GFRAA >60 110 >60  --   --   PROT  7.1  --  7.2 7.3 6.8  ALBUMIN 3.8  --  3.9 3.9 3.8  AST 16  --  14* 11* 13*  ALT 17  --  '11 10 11  ' ALKPHOS 111  --  80 93 80  BILITOT 0.7  --  0.6 0.8 0.7    RADIOGRAPHIC STUDIES: I have personally reviewed the radiological images as listed and agreed with the findings in the report. No results found.  ASSESSMENT & PLAN:   Malignant neoplasm of lower-outer quadrant of left breast of female, estrogen  receptor positive ([place]) # breast cancer Stage III ER/PR pos her 2 NEG. currently on armoasin+ Zoldadex.  No clinical evidence of recurrence. STABLE .Zoladex today.  #Patient has poor tolerance to Aromasin [joint pains extreme fatigue]-I think is reasonable to try tamoxifen 10 mg [lower dose; previous intolerance to 20 mg;].   # Depression/ Insomnia-STABLE- On cymblata/Zoloft [DI-none as per uptodate*] Dr.kapur]  # Joint pains-multifactorial-question related to AI versus others/arthritis.  As above stopped AI.  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   #Lymphedema-left chest wall.  Undergoing physical therapy- STABLE.   # DM-2 on insulin- STABLE [A1c-6.8]Blood glucose-162;   Defer to PCP  # Port malfunction: flush; not drawing blood; recommend port study.   # DISPOSITION: # mammo- aug 2022 # Zoladex Today; # dye study # Follow up in 3 month;  MD;cbc/cmp/ca-27-29; Zoladex;zometa- port flush. Dr.B  All questions were answered. The patient knows to call the clinic with any problems, questions or concerns.    Cammie Sickle, MD 04/22/2020 8:38 PM

## 2020-04-22 NOTE — Progress Notes (Signed)
RN Chaperoned provider with Breast Exam.   

## 2020-04-22 NOTE — Assessment & Plan Note (Addendum)
#   breast cancer Stage III ER/PR pos her 2 NEG. currently on armoasin+ Zoldadex.  No clinical evidence of recurrence. STABLE .Zoladex today.  #Patient has poor tolerance to Aromasin [joint pains extreme fatigue]-I think is reasonable to try tamoxifen 10 mg [lower dose; previous intolerance to 20 mg;].   # Depression/ Insomnia-STABLE- On cymblata/Zoloft [DI-none as per uptodate*] Dr.kapur]  # Joint pains-multifactorial-question related to AI versus others/arthritis.  As above stopped AI.  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   #Lymphedema-left chest wall.  Undergoing physical therapy- STABLE.   # DM-2 on insulin- STABLE [A1c-6.8]Blood glucose-162;   Defer to PCP  # Port malfunction: flush; not drawing blood; recommend port study.   # DISPOSITION: # mammo- aug 2022 # Zoladex Today; # dye study # Follow up in 3 month;  MD;cbc/cmp/ca-27-29; Zoladex;zometa- port flush. Dr.B

## 2020-04-23 LAB — CANCER ANTIGEN 27.29: CA 27.29: 9.3 U/mL (ref 0.0–38.6)

## 2020-04-28 NOTE — Progress Notes (Signed)
PROVIDER NOTE: Information contained herein reflects review and annotations entered in association with encounter. Interpretation of such information and data should be left to medically-trained personnel. Information provided to patient can be located elsewhere in the medical record under "Patient Instructions". Document created using STT-dictation technology, any transcriptional errors that may result from process are unintentional.    Patient: Carolyn Roy  Service Category: E/M  Provider: Gaspar Cola, MD  DOB: [birth year]  DOS: 04/29/2020  Specialty: Interventional Pain Management  MRN: [id number]  Setting: Ambulatory outpatient  PCP: [name], NP  Type: Established Patient    Referring Provider: [name], NP  Location: Office  Delivery: Face-to-face     HPI  [name], a 55 y.o. year old female, is here today because of her Chronic pain syndrome [G89.4]. [name] primary complain today is Back Pain (Lumbar bilateral ), Knee Pain (Right ), and Hip Pain (Right ) Last encounter: My last encounter with her was on 02/03/2020. Pertinent problems: [name] has Chronic fatigue; Cancer of midline of breast, left ([place]); Family history of breast cancer; Chemotherapy-induced neuropathy ([place]); Malignant neoplasm of lower-outer quadrant of left breast of female, estrogen receptor positive ([place]); Chronic feet pain (1ry area of Pain) (Bilateral) (R>L); Neuropathic pain of feet (Bilateral); Chronic knee pain (2ry area of Pain) (Bilateral) (R>L); Chronic hand pain (3ry area of Pain) (Bilateral) (R>L); Chronic pain syndrome; Chronic hip pain (4th area of Pain) (Bilateral) (R>L); Chronic upper extremity pain (3ry area of Pain) (Bilateral) (R>L); Cancer-related pain; Neuropathic pain; Osteoarthritis of knee (Right); Chronic low back pain (Bilateral (L>R) w/o sciatica; Chronic hip pain (Left); Lumbar facet syndrome (Bilateral) (L>R); Idiopathic scoliosis; Chronic sacroiliac joint  pain (Left); Chronic pain of knee (Right); Abnormal MRI, lumbar spine (04/20/2018); Lumbar facet arthropathy; Spondylosis without myelopathy or radiculopathy, lumbosacral region; DDD (degenerative disc disease), lumbosacral; Numbness and tingling of upper extremity (C6/C7 dermatomes) (Right); Cervicalgia; and Cervical radiculitis (Right) on their pertinent problem list. Pain Assessment: Severity of Chronic pain is reported as a 6 /10. Location: Back (see visit info for additional pain sites.) Lower,Left,Right/bottom of the foot to knee and to top of the hip. Onset: More than a month ago. Quality: Discomfort,Constant,Dull,Aching,Burning (nauseating at times.). Timing: Constant. Modifying factor(s): ice pack vs heat pack. knee brace.. Vitals:  height is '5\' 4"'  (1.626 m) and weight is 174 lb (78.9 kg). Her temporal temperature is 97 F (36.1 C) (abnormal). Her blood pressure is 115/78 and her pulse is 82. Her respiration is 16 and oxygen saturation is 96%.   Reason for encounter: medication management.   The patient indicates doing well with the current medication regimen. No adverse reactions or side effects reported to the medications.  Admits to having taken some extra pain medicine towards the fact that she was knocked down by her dog and this triggered some right knee pain.  She is now better.  Today I talked to her about Preemptive Analgesia and I have provided her with some written information regarding this so that the next time it happens she tries that instead of taking additional opioids.  Today she was reminded that whenever she takes additional opioid she may run out of medicine early and we will not be doing any early refills.  She understood and accepted.  RTCB: 08/03/2020 Nonopioids transfer 02/03/2020: Pregabalin (Lyrica) and alpha-lipoic acid  Pharmacotherapy Assessment   Analgesic: Oxycodone IR 5 mg, 1 tab PO BID (10 mg/day of oxycodone) (Average: 2 tabs/day = 10 mg/day  of  oxycodone) MME/day: 15 mg/day.   Monitoring: [place] PMP: PDMP reviewed during this encounter.       Pharmacotherapy: No side-effects or adverse reactions reported. Compliance: No problems identified. Effectiveness: Clinically acceptable.  Carolyn Billow, RN  04/29/2020  2:41 PM  Sign when Signing Visit Nursing Pain Medication Assessment:  Safety precautions to be maintained throughout the outpatient stay will include: orient to surroundings, keep bed in low position, maintain call bell within reach at all times, provide assistance with transfer out of bed and ambulation.  Medication Inspection Compliance: Pill count conducted under aseptic conditions, in front of the patient. Neither the pills nor the bottle was removed from the patient's sight at any time. Once count was completed pills were immediately returned to the patient in their original bottle.  Medication: Oxycodone IR Pill/Patch Count: 12 of 60 pills remain Pill/Patch Appearance: Markings consistent with prescribed medication Bottle Appearance: Standard pharmacy container. Clearly labeled. Filled Date: 03 / 01 / 2022 Last Medication intake:  Today    UDS:  Summary  Date Value Ref Range Status  09/18/2019 Note  Final    Comment:    ==================================================================== [employer] [facility] (MW) ==================================================================== Test                             Result       Flag       Units  Drug Present and Declared for Prescription Verification   Oxazepam                       63           EXPECTED   ng/mg creat   Temazepam                      182          EXPECTED   ng/mg creat    Oxazepam and temazepam are expected metabolites of diazepam.    Oxazepam is also an expected metabolite of other benzodiazepine    drugs, including chlordiazepoxide, prazepam, clorazepate, halazepam,    and temazepam.  Oxazepam and temazepam are available as scheduled     prescription medications.    Oxycodone                      1417         EXPECTED   ng/mg creat   Oxymorphone                    545          EXPECTED   ng/mg creat   Noroxycodone                   2620         EXPECTED   ng/mg creat   Noroxymorphone                 188          EXPECTED   ng/mg creat    Sources of oxycodone are scheduled prescription medications.    Oxymorphone, noroxycodone, and noroxymorphone are expected    metabolites of oxycodone. Oxymorphone is also available as a    scheduled prescription medication.  Drug Present not Declared for Prescription Verification   Alcohol, Ethyl                 0.043  UNEXPECTED g/dL    Sources of ethyl alcohol include alcoholic beverages or as a    fermentation product of glucose; glucose is present in this specimen.    Interpret result with caution, as the presence of ethyl alcohol is    likely due, at least in part, to fermentation of glucose.  ==================================================================== Test                      Result    Flag   Units      Ref Range   Creatinine              82               mg/dL      >=20 ==================================================================== Declared Medications:  The flagging and interpretation on this report are based on the  following declared medications.  Unexpected results may arise from  inaccuracies in the declared medications.   **Note: The testing scope of this panel includes these medications:   Oxycodone  Temazepam (Restoril)   **Note: The testing scope of this panel does not include the  following reported medications:   Acetaminophen (Excedrin)  Albuterol (Ventolin HFA)  Anastrozole (Arimidex)  Aspirin (Excedrin)  Atorvastatin (Lipitor)  Caffeine (Excedrin)  Canagliflozin (Invokana)  Duloxetine (Cymbalta)  Hydroxyzine (Vistaril)  Ondansetron (Zofran)  Pregabalin (Lyrica)  Prilocaine (EMLA)  Prochlorperazine (Compazine)  Semaglutide  (Ozempic)  Sertraline (Zoloft)  Topical Lidocaine (EMLA) ==================================================================== For clinical consultation, please call [phone]. ====================================================================      ROS  Constitutional: Denies any fever or chills Gastrointestinal: No reported hemesis, hematochezia, vomiting, or acute GI distress Musculoskeletal: Denies any acute onset joint swelling, redness, loss of ROM, or weakness Neurological: No reported episodes of acute onset apraxia, aphasia, dysarthria, agnosia, amnesia, paralysis, loss of coordination, or loss of consciousness  Medication Review  Alpha-Lipoic Acid, Basaglar KwikPen, DULoxetine, Dulaglutide, Insulin Pen Needle, aspirin-acetaminophen-caffeine, atorvastatin, empagliflozin, ergocalciferol, exemestane, glucose blood, goserelin, oxyCODONE, pregabalin, sertraline, tamoxifen, and traZODone  History Review  Allergy: [name] is allergic to aspirin. Drug: [name]  reports no history of drug use. Alcohol:  reports no history of alcohol use. Tobacco:  reports that she has been smoking cigarettes. She has a 11.00 pack-year smoking history. She has quit using smokeless tobacco.  Her smokeless tobacco use included snuff. Social: [name]  reports that she has been smoking cigarettes. She has a 11.00 pack-year smoking history. She has quit using smokeless tobacco.  Her smokeless tobacco use included snuff. She reports that she does not drink alcohol and does not use drugs. Medical:  has a past medical history of Allergy, Breast cancer ([place]), Cancer ([place]) (06/15/2017), Depression, Diabetes mellitus without complication ([place]) (3329), Edema of left upper extremity, Endometriosis, Family history of breast cancer, Headache, Hyperlipidemia, Lymphedema of left arm, Ovarian mass, Personal history of chemotherapy, Personal history of radiation therapy, and Pneumonia. Surgical: [name]  has a  past surgical history that includes Oophorectomy; Tubal ligation; Portacath placement (Right, 07/14/2017); Axillary lymph node biopsy (Left, 07/14/2017); Breast lumpectomy (Left, 01/12/2018); Breast biopsy (Left); Partial mastectomy with needle localization (Left, 01/12/2018); Sentinel node biopsy (Left, 01/12/2018); and Colonoscopy with propofol (N/A, 12/27/2019). Family: family history includes Breast cancer in an other family member; Breast cancer (age of onset: 25) in her maternal grandmother; Breast cancer (age of onset: 53) in an other family member; Breast cancer (age of onset: 53) in her maternal aunt; Cancer in her mother; Colon cancer in her maternal grandmother and mother;  Diabetes in her brother; Other in her father; Pancreatitis in her brother; Prostate cancer (age of onset: 52) in her brother.  Laboratory Chemistry Profile   Renal Lab Results  Component Value Date   BUN 11 04/22/2020   CREATININE 0.63 04/22/2020   BCR 19 10/18/2019   GFRAA >60 10/21/2019   GFRNONAA >60 04/22/2020     Hepatic Lab Results  Component Value Date   AST 13 (L) 04/22/2020   ALT 11 04/22/2020   ALBUMIN 3.8 04/22/2020   ALKPHOS 80 04/22/2020     Electrolytes Lab Results  Component Value Date   NA 138 04/22/2020   K 3.4 (L) 04/22/2020   CL 104 04/22/2020   CALCIUM 8.7 (L) 04/22/2020   MG 1.9 11/23/2017     Bone Lab Results  Component Value Date   VD25OH 20.0 (L) 12/13/2017     Inflammation (CRP: Acute Phase) (ESR: Chronic Phase) Lab Results  Component Value Date   CRP 7 01/22/2018   ESRSEDRATE 27 01/22/2018       Note: Above Lab results reviewed.  Recent Imaging Review  DG Bone Density EXAM: DUAL X-RAY ABSORPTIOMETRY (DXA) FOR BONE MINERAL DENSITY  IMPRESSION: Your patient Carolyn Roy completed a BMD test on 01/21/2020 using the [place] (software version: 14.10) manufactured by [employer]. The following summarizes the results of our evaluation.  Technologist: ecj PATIENT BIOGRAPHICAL: Name: Carolyn Roy, Carolyn Roy Patient ID: [id number] Birth Date: [birth year] Height: 64.7 in. Gender: Female Exam Date: 01/21/2020 Weight: 176.8 lbs. Indications: Breast CA, Caucasian, Diabetic, High Risk Meds, History of Breast Cancer, History of Chemo, History of Fracture (Adult), History of Radiation, Hx of Tobacco Use, Oophorectomy Unilateral, Parent Hip Fracture, Postmenopausal, Previous Chemo and Radiation, Tobacco User, Vitamin D Deficiency, Family Hist. (Parent hip fracture), Tobacco User (Current Smoker) Fractures: Facial Bone, Left foot, Left shoulder Treatments: Anastrozole, Jardiance, Trulicity, Zoladex DENSITOMETRY RESULTS: Site      Region     Measured Date Measured Age WHO Classification Young Adult T-score BMD         %Change vs. Previous Significant Change (*) AP Spine L1-L4 01/21/2020 52.4 Normal 0.3 1.230 g/cm2 - -  DualFemur Neck Right 01/21/2020 52.4 Normal 0.4 1.089 g/cm2 - -  DualFemur Total Mean 01/21/2020 52.4 Normal 0.6 1.081 g/cm2 - - ASSESSMENT: The BMD measured at AP Spine L1-L4 is 1.230 g/cm2 with a T-score of 0.3. This patient is considered normal according to [facility]) criteria. The scan quality is good. World Pharmacologist [facility]) criteria for post-menopausal, Caucasian Women: Normal:                   T-score at or above -1 SD Osteopenia/low bone mass: T-score between -1 and -2.5 SD Osteoporosis:             T-score at or below -2.5 SD  RECOMMENDATIONS: 1. All patients should optimize calcium and vitamin D intake. 2. Consider FDA-approved medical therapies in postmenopausal women and men aged 26 years and older, based on the following: a. A hip or vertebral(clinical or morphometric) fracture b. T-score < -2.5 at the femoral neck or spine after appropriate evaluation to exclude secondary causes c. Low bone mass (T-score between -1.0 and -2.5 at the femoral neck or spine) and a  10-year probability of a hip fracture > 3% or a 10-year probability of a major osteoporosis-related fracture > 20% based on the [place]-adapted WHO algorithm 3. Clinician judgment and/or patient preferences may indicate treatment for  people with 10-year fracture probabilities above or below these levels FOLLOW-UP: People with diagnosed cases of osteoporosis or at high risk for fracture should have regular bone mineral density tests. For patients eligible for Medicare, routine testing is allowed once every 2 years. The testing frequency can be increased to one year for patients who have rapidly progressing disease, those who are receiving or discontinuing medical therapy to restore bone mass, or have additional risk factors.  I have reviewed this report, and agree with the above findings.  [facility]  Electronically Signed   By: Lowella Grip III M.D.   On: 01/21/2020 10:30 Note: Reviewed        Physical Exam  General appearance: Well nourished, well developed, and well hydrated. In no apparent acute distress Mental status: Alert, oriented x 3 (person, place, & time)       Respiratory: No evidence of acute respiratory distress Eyes: PERLA Vitals: BP 115/78 (BP Location: Right Arm, Patient Position: Sitting, Cuff Size: Normal)   Pulse 82   Temp (!) 97 F (36.1 C) (Temporal)   Resp 16   Ht '5\' 4"'  (1.626 m)   Wt 174 lb (78.9 kg)   LMP  (LMP Unknown) Comment: LAST PERIOD IN MAY 2019 WHEN SHE STARTED CHEMO  SpO2 96%   BMI 29.87 kg/m  BMI: Estimated body mass index is 29.87 kg/m as calculated from the following:   Height as of this encounter: '5\' 4"'  (1.626 m).   Weight as of this encounter: 174 lb (78.9 kg). Ideal: Ideal body weight: 54.7 kg (120 lb 9.5 oz) Adjusted ideal body weight: 64.4 kg (141 lb 15.3 oz)  Assessment   Status Diagnosis  Controlled Controlled Controlled 1. Chronic pain syndrome   2. Chronic feet pain (1ry area of Pain) (Bilateral) (R>L)    3. Chronic knee pain (2ry area of Pain) (Bilateral) (R>L)   4. Chronic hand pain (3ry area of Pain) (Bilateral) (R>L)   5. Chronic hip pain (4th area of Pain) (Bilateral) (R>L)   6. Chronic upper extremity pain (3ry area of Pain) (Bilateral) (R>L)   7. Pharmacologic therapy   8. Chronic use of opiate for therapeutic purpose      Updated Problems: Problem  Chronic upper extremity pain (3ry area of Pain) (Bilateral) (R>L)  Chronic feet pain (1ry area of Pain) (Bilateral) (R>L)  Chronic knee pain (2ry area of Pain) (Bilateral) (R>L)  Chronic hand pain (3ry area of Pain) (Bilateral) (R>L)  Chronic hip pain (4th area of Pain) (Bilateral) (R>L)  Chronic Use of Opiate for Therapeutic Purpose  Pharmacologic Therapy    Plan of Care  Problem-specific:  No problem-specific Assessment & Plan notes found for this encounter.  [name] has a current medication list which includes the following long-term medication(s): alpha-lipoic acid, atorvastatin, basaglar Carolyn Roy, [START ON 05/05/2020] oxycodone, [START ON 06/04/2020] oxycodone, [START ON 07/04/2020] oxycodone, pregabalin, and sertraline.  Pharmacotherapy (Medications Ordered): Meds ordered this encounter  Medications  . oxyCODONE (OXY IR/ROXICODONE) 5 MG immediate release tablet    Sig: Take 1 tablet (5 mg total) by mouth 2 (two) times daily as needed for severe pain. Must last 30 days    Dispense:  60 tablet    Refill:  0    Chronic Pain: STOP Act (Not applicable) Fill 1 day early if closed on refill date. Avoid benzodiazepines within 8 hours of opioids  . oxyCODONE (OXY IR/ROXICODONE) 5 MG immediate release tablet    Sig: Take 1 tablet (5  mg total) by mouth 2 (two) times daily as needed for severe pain. Must last 30 days    Dispense:  60 tablet    Refill:  0    Chronic Pain: STOP Act (Not applicable) Fill 1 day early if closed on refill date. Avoid benzodiazepines within 8 hours of opioids  . oxyCODONE (OXY IR/ROXICODONE) 5  MG immediate release tablet    Sig: Take 1 tablet (5 mg total) by mouth 2 (two) times daily as needed for severe pain. Must last 30 days    Dispense:  60 tablet    Refill:  0    Chronic Pain: STOP Act (Not applicable) Fill 1 day early if closed on refill date. Avoid benzodiazepines within 8 hours of opioids   Orders:  No orders of the defined types were placed in this encounter.  Follow-up plan:   Return in about 3 months (around 08/03/2020) for (F2F), (MM).      Considering:   Possible bilateral lumbar facet RFA  Diagnostic lumbar sympathetic block  Diagnostic right genicular NB  Possible right genicular nerve RFA    Palliative PRN treatment(s):   Therapeutic right IA knee joint injection #3 (steroids)  Diagnostic bilateral lumbar facet block #2  Therapeutic/palliative right knee Hyalgan inj. S2/N1         Recent Visits Date Type Provider Dept  02/03/20 Office Visit Milinda Pointer, MD [facility]  Showing recent visits within past 90 days and meeting all other requirements Today's Visits Date Type Provider Dept  04/29/20 Office Visit Milinda Pointer, MD [facility]  Showing today's visits and meeting all other requirements Future Appointments No visits were found meeting these conditions. Showing future appointments within next 90 days and meeting all other requirements  I discussed the assessment and treatment plan with the patient. The patient was provided an opportunity to ask questions and all were answered. The patient agreed with the plan and demonstrated an understanding of the instructions.  Patient advised to call back or seek an in-person evaluation if the symptoms or condition worsens.  Duration of encounter: 30 minutes.  Note by: Gaspar Cola, MD Date: 04/29/2020; Time: 3:18 PM

## 2020-04-29 ENCOUNTER — Ambulatory Visit: Payer: No Typology Code available for payment source | Attending: Pain Medicine | Admitting: Pain Medicine

## 2020-04-29 ENCOUNTER — Other Ambulatory Visit: Payer: Self-pay

## 2020-04-29 ENCOUNTER — Encounter: Payer: Self-pay | Admitting: Pain Medicine

## 2020-04-29 VITALS — BP 115/78 | HR 82 | Temp 97.0°F | Resp 16 | Ht 64.0 in | Wt 174.0 lb

## 2020-04-29 DIAGNOSIS — M79671 Pain in right foot: Secondary | ICD-10-CM | POA: Diagnosis not present

## 2020-04-29 DIAGNOSIS — M79642 Pain in left hand: Secondary | ICD-10-CM

## 2020-04-29 DIAGNOSIS — M25552 Pain in left hip: Secondary | ICD-10-CM | POA: Insufficient documentation

## 2020-04-29 DIAGNOSIS — M79602 Pain in left arm: Secondary | ICD-10-CM

## 2020-04-29 DIAGNOSIS — M79601 Pain in right arm: Secondary | ICD-10-CM

## 2020-04-29 DIAGNOSIS — M25562 Pain in left knee: Secondary | ICD-10-CM | POA: Insufficient documentation

## 2020-04-29 DIAGNOSIS — Z79899 Other long term (current) drug therapy: Secondary | ICD-10-CM | POA: Insufficient documentation

## 2020-04-29 DIAGNOSIS — M25551 Pain in right hip: Secondary | ICD-10-CM | POA: Diagnosis present

## 2020-04-29 DIAGNOSIS — G894 Chronic pain syndrome: Secondary | ICD-10-CM | POA: Diagnosis not present

## 2020-04-29 DIAGNOSIS — M79672 Pain in left foot: Secondary | ICD-10-CM | POA: Diagnosis present

## 2020-04-29 DIAGNOSIS — M79641 Pain in right hand: Secondary | ICD-10-CM | POA: Diagnosis not present

## 2020-04-29 DIAGNOSIS — Z79891 Long term (current) use of opiate analgesic: Secondary | ICD-10-CM | POA: Insufficient documentation

## 2020-04-29 DIAGNOSIS — G8929 Other chronic pain: Secondary | ICD-10-CM | POA: Insufficient documentation

## 2020-04-29 DIAGNOSIS — M25561 Pain in right knee: Secondary | ICD-10-CM | POA: Diagnosis not present

## 2020-04-29 MED ORDER — OXYCODONE HCL 5 MG PO TABS: 5.0000 mg | ORAL_TABLET | Freq: Two times a day (BID) | ORAL | 0 refills | Status: DC | PRN

## 2020-04-29 NOTE — Patient Instructions (Signed)
____________________________________________________________________________________________  Pain Prevention Technique  Definition:   A technique used to minimize the effects of an activity known to cause inflammation or swelling, which in turn leads to an increase in pain.  Purpose: To prevent swelling from occurring. It is based on the fact that it is easier to prevent swelling from happening than it is to get rid of it, once it occurs.  Contraindications: 1. Anyone with allergy or hypersensitivity to the recommended medications. 2. Anyone taking anticoagulants (Blood Thinners) (e.g., Coumadin, Warfarin, Plavix, etc.). 3. Patients in Renal Failure.  Technique: Before you undertake an activity known to cause pain, or a flare-up of your chronic pain, and before you experience any pain, do the following:  1. On a full stomach, take 4 (four) over the counter Ibuprofens 200mg  tablets (Motrin), for a total of 800 mg. 2. In addition, take over the counter Magnesium 400 to 500 mg, before doing the activity.  3. Six (6) hours later, again on a full stomach, repeat the Ibuprofen. 4. That night, take a warm shower and stretch under the running warm water.  This technique may be sufficient to abort the pain and discomfort before it happens. Keep in mind that it takes a lot less medication to prevent swelling than it takes to eliminate it once it occurs.  ____________________________________________________________________________________________   ____________________________________________________________________________________________  Medication Recommendations and Reminders  Applies to: All patients receiving prescriptions (written and/or electronic).  Medication Rules & Regulations: These rules and regulations exist for your safety and that of others. They are not flexible and neither are we. Dismissing or ignoring them will be considered "non-compliance" with medication therapy,  resulting in complete and irreversible termination of such therapy. (See document titled "Medication Rules" for more details.) In all conscience, because of safety reasons, we cannot continue providing a therapy where the patient does not follow instructions.  Pharmacy of record:   Definition: This is the pharmacy where your electronic prescriptions will be sent.   We do not endorse any particular pharmacy, however, we have experienced problems with [facility] not securing enough medication supply for the community.  We do not restrict you in your choice of pharmacy. However, once we write for your prescriptions, we will NOT be re-sending more prescriptions to fix restricted supply problems created by your pharmacy, or your insurance.   The pharmacy listed in the electronic medical record should be the one where you want electronic prescriptions to be sent.  If you choose to change pharmacy, simply notify our nursing staff.  Recommendations:  Keep all of your pain medications in a safe place, under lock and key, even if you live alone. We will NOT replace lost, stolen, or damaged medication.  After you fill your prescription, take 1 week's worth of pills and put them away in a safe place. You should keep a separate, properly labeled bottle for this purpose. The remainder should be kept in the original bottle. Use this as your primary supply, until it runs out. Once it's gone, then you know that you have 1 week's worth of medicine, and it is time to come in for a prescription refill. If you do this correctly, it is unlikely that you will ever run out of medicine.  To make sure that the above recommendation works, it is very important that you make sure your medication refill appointments are scheduled at least 1 week before you run out of medicine. To do this in an effective manner, make sure that you do not  leave the office without scheduling your next medication management appointment. Always ask  the nursing staff to show you in your prescription , when your medication will be running out. Then arrange for the receptionist to get you a return appointment, at least 7 days before you run out of medicine. Do not wait until you have 1 or 2 pills left, to come in. This is very poor planning and does not take into consideration that we may need to cancel appointments due to bad weather, sickness, or emergencies affecting our staff.  DO NOT ACCEPT A "Partial Fill": If for any reason your pharmacy does not have enough pills/tablets to completely fill or refill your prescription, do not allow for a "partial fill". The law allows the pharmacy to complete that prescription within 72 hours, without requiring a new prescription. If they do not fill the rest of your prescription within those 72 hours, you will need a separate prescription to fill the remaining amount, which we will NOT provide. If the reason for the partial fill is your insurance, you will need to talk to the pharmacist about payment alternatives for the remaining tablets, but again, DO NOT ACCEPT A PARTIAL FILL, unless you can trust your pharmacist to obtain the remainder of the pills within 72 hours.  Prescription refills and/or changes in medication(s):   Prescription refills, and/or changes in dose or medication, will be conducted only during scheduled medication management appointments. (Applies to both, written and electronic prescriptions.)  No refills on procedure days. No medication will be changed or started on procedure days. No changes, adjustments, and/or refills will be conducted on a procedure day. Doing so will interfere with the diagnostic portion of the procedure.  No phone refills. No medications will be "called into the pharmacy".  No Fax refills.  No weekend refills.  No Holliday refills.  No after hours refills.  Remember:  Business hours are:  Monday to Thursday 8:00 AM to 4:00 PM Provider's  Schedule: Milinda Pointer, MD - Appointments are:  Medication management: Monday and Wednesday 8:00 AM to 4:00 PM Procedure day: Tuesday and Thursday 7:30 AM to 4:00 PM Gillis Santa, MD - Appointments are:  Medication management: Tuesday and Thursday 8:00 AM to 4:00 PM Procedure day: Monday and Wednesday 7:30 AM to 4:00 PM (Last update: 08/28/2019) ____________________________________________________________________________________________   ____________________________________________________________________________________________  Medication Rules  Purpose: To inform patients, and their family members, of our rules and regulations.  Applies to: All patients receiving prescriptions (written or electronic).  Pharmacy of record: Pharmacy where electronic prescriptions will be sent. If written prescriptions are taken to a different pharmacy, please inform the nursing staff. The pharmacy listed in the electronic medical record should be the one where you would like electronic prescriptions to be sent.  Electronic prescriptions: In compliance with the [place] (STOP) Act of 2017 (Session Lanny Cramp [phone]), effective February 07, 2018, all controlled substances must be electronically prescribed. Calling prescriptions to the pharmacy will cease to exist.  Prescription refills: Only during scheduled appointments. Applies to all prescriptions.  NOTE: The following applies primarily to controlled substances (Opioid* Pain Medications).   Type of encounter (visit): For patients receiving controlled substances, face-to-face visits are required. (Not an option or up to the patient.)  Patient's responsibilities: 1. Pain Pills: Bring all pain pills to every appointment (except for procedure appointments). 2. Pill Bottles: Bring pills in original pharmacy bottle. Always bring the newest bottle. Bring bottle, even if empty. 3. Medication refills: You  are  responsible for knowing and keeping track of what medications you take and those you need refilled. The day before your appointment: write a list of all prescriptions that need to be refilled. The day of the appointment: give the list to the admitting nurse. Prescriptions will be written only during appointments. No prescriptions will be written on procedure days. If you forget a medication: it will not be "Called in", "Faxed", or "electronically sent". You will need to get another appointment to get these prescribed. No early refills. Do not call asking to have your prescription filled early. 4. Prescription Accuracy: You are responsible for carefully inspecting your prescriptions before leaving our office. Have the discharge nurse carefully go over each prescription with you, before taking them home. Make sure that your name is accurately spelled, that your address is correct. Check the name and dose of your medication to make sure it is accurate. Check the number of pills, and the written instructions to make sure they are clear and accurate. Make sure that you are given enough medication to last until your next medication refill appointment. 5. Taking Medication: Take medication as prescribed. When it comes to controlled substances, taking less pills or less frequently than prescribed is permitted and encouraged. Never take more pills than instructed. Never take medication more frequently than prescribed.  6. Inform other Doctors: Always inform, all of your healthcare providers, of all the medications you take. 7. Pain Medication from other Providers: You are not allowed to accept any additional pain medication from any other Doctor or Healthcare provider. There are two exceptions to this rule. (see below) In the event that you require additional pain medication, you are responsible for notifying us, as stated below. 8. Cough Medicine: Often these contain an opioid, such as codeine or hydrocodone.  Never accept or take cough medicine containing these opioids if you are already taking an opioid* medication. The combination may cause respiratory failure and death. 9. Medication Agreement: You are responsible for carefully reading and following our Medication Agreement. This must be signed before receiving any prescriptions from our practice. Safely store a copy of your signed Agreement. Violations to the Agreement will result in no further prescriptions. (Additional copies of our Medication Agreement are available upon request.) 10. Laws, Rules, & Regulations: All patients are expected to follow all Federal and [employer], Rules, [employer]. Ignorance of the Laws does not constitute a valid excuse.  11. Illegal drugs and Controlled Substances: The use of illegal substances (including, but not limited to marijuana and its derivatives) and/or the illegal use of any controlled substances is strictly prohibited. Violation of this rule may result in the immediate and permanent discontinuation of any and all prescriptions being written by our practice. The use of any illegal substances is prohibited. 12. Adopted CDC guidelines & recommendations: Target dosing levels will be at or below 60 MME/day. Use of benzodiazepines** is not recommended.  Exceptions: There are only two exceptions to the rule of not receiving pain medications from other Healthcare Providers. 1. Exception #1 (Emergencies): In the event of an emergency (i.e.: accident requiring emergency care), you are allowed to receive additional pain medication. However, you are responsible for: As soon as you are able, call our office [phone], at any time of the day or night, and leave a message stating your name, the date and nature of the emergency, and the name and dose of the medication prescribed. In the event that your call is answered by  a member of our staff, make sure to document and save the date, time, and the name of the  person that took your information.  2. Exception #2 (Planned Surgery): In the event that you are scheduled by another doctor or dentist to have any type of surgery or procedure, you are allowed (for a period no longer than 30 days), to receive additional pain medication, for the acute post-op pain. However, in this case, you are responsible for picking up a copy of our "Post-op Pain Management for Surgeons" handout, and giving it to your surgeon or dentist. This document is available at our office, and does not require an appointment to obtain it. Simply go to our office during business hours (Monday-Thursday from 8:00 AM to 4:00 PM) (Friday 8:00 AM to 12:00 Noon) or if you have a scheduled appointment with [place], prior to your surgery, and ask for it by name. In addition, you are responsible for: calling our office [phone], at any time of the day or night, and leaving a message stating your name, name of your surgeon, type of surgery, and date of procedure or surgery. Failure to comply with your responsibilities may result in termination of therapy involving the controlled substances.  *Opioid medications include: morphine, codeine, oxycodone, oxymorphone, hydrocodone, hydromorphone, meperidine, tramadol, tapentadol, buprenorphine, fentanyl, methadone. **Benzodiazepine medications include: diazepam (Valium), alprazolam (Xanax), clonazepam (Klonopine), lorazepam (Ativan), clorazepate (Tranxene), chlordiazepoxide (Librium), estazolam (Prosom), oxazepam (Serax), temazepam (Restoril), triazolam (Halcion) (Last updated: 01/06/2020) ____________________________________________________________________________________________

## 2020-04-29 NOTE — Progress Notes (Signed)
Nursing Pain Medication Assessment:  Safety precautions to be maintained throughout the outpatient stay will include: orient to surroundings, keep bed in low position, maintain call bell within reach at all times, provide assistance with transfer out of bed and ambulation.  Medication Inspection Compliance: Pill count conducted under aseptic conditions, in front of the patient. Neither the pills nor the bottle was removed from the patient's sight at any time. Once count was completed pills were immediately returned to the patient in their original bottle.  Medication: Oxycodone IR Pill/Patch Count: 12 of 60 pills remain Pill/Patch Appearance: Markings consistent with prescribed medication Bottle Appearance: Standard pharmacy container. Clearly labeled. Filled Date: 03 / 01 / 2022 Last Medication intake:  Today

## 2020-05-06 ENCOUNTER — Other Ambulatory Visit: Payer: Self-pay

## 2020-05-06 ENCOUNTER — Ambulatory Visit
Admission: RE | Admit: 2020-05-06 | Discharge: 2020-05-06 | Disposition: A | Discharge status: Home / Self Care | Payer: No Typology Code available for payment source | Source: Ambulatory Visit | Attending: Internal Medicine | Admitting: Internal Medicine

## 2020-05-06 DIAGNOSIS — Z170 Estrogen receptor positive status [ER+]: Secondary | ICD-10-CM | POA: Diagnosis present

## 2020-05-06 DIAGNOSIS — C50512 Malignant neoplasm of lower-outer quadrant of left female breast: Secondary | ICD-10-CM | POA: Diagnosis not present

## 2020-05-06 DIAGNOSIS — Z95828 Presence of other vascular implants and grafts: Secondary | ICD-10-CM | POA: Insufficient documentation

## 2020-05-06 MED ORDER — IOHEXOL 300 MG/ML  SOLN
50.0000 mL | Freq: Once | INTRAMUSCULAR | Status: AC | PRN
  Administered 2020-05-06: 20 mL

## 2020-05-12 ENCOUNTER — Telehealth: Payer: Self-pay | Admitting: Internal Medicine

## 2020-05-12 NOTE — Telephone Encounter (Signed)
H/T- please inform pt that she has scar tissue around the cathter; and that she will need lytic therapy. Please schedule for TPA. Thanks

## 2020-05-14 NOTE — Telephone Encounter (Signed)
Will you schedule patient for a TPA appointment with infusion? It should be a 2 hour slot.

## 2020-05-15 ENCOUNTER — Inpatient Hospital Stay: Payer: No Typology Code available for payment source | Attending: Internal Medicine

## 2020-06-02 ENCOUNTER — Inpatient Hospital Stay: Payer: No Typology Code available for payment source | Attending: Internal Medicine

## 2020-06-02 DIAGNOSIS — Z95828 Presence of other vascular implants and grafts: Secondary | ICD-10-CM

## 2020-06-02 DIAGNOSIS — C50512 Malignant neoplasm of lower-outer quadrant of left female breast: Secondary | ICD-10-CM | POA: Insufficient documentation

## 2020-06-02 DIAGNOSIS — Z452 Encounter for adjustment and management of vascular access device: Secondary | ICD-10-CM | POA: Diagnosis present

## 2020-06-02 DIAGNOSIS — Z170 Estrogen receptor positive status [ER+]: Secondary | ICD-10-CM | POA: Diagnosis not present

## 2020-06-02 MED ORDER — HEPARIN SOD (PORK) LOCK FLUSH 100 UNIT/ML IV SOLN
500.0000 [IU] | Freq: Once | INTRAVENOUS | Status: AC
  Administered 2020-06-02: 500 [IU] via INTRAVENOUS
  Filled 2020-06-02: qty 5

## 2020-06-02 MED ORDER — SODIUM CHLORIDE 0.9% FLUSH
10.0000 mL | Freq: Once | INTRAVENOUS | Status: AC
  Administered 2020-06-02: 10 mL via INTRAVENOUS
  Filled 2020-06-02: qty 10

## 2020-06-02 MED ORDER — ALTEPLASE 2 MG IJ SOLR
2.0000 mg | Freq: Once | INTRAMUSCULAR | Status: DC
  Filled 2020-06-02: qty 2

## 2020-06-02 NOTE — Progress Notes (Signed)
Patient here for Cathflo injection. Port accessed, excellent blood return noted by staff and patient. Cathflo not given. Stable at discharge

## 2020-06-03 ENCOUNTER — Other Ambulatory Visit: Payer: Self-pay | Admitting: Internal Medicine

## 2020-06-05 ENCOUNTER — Other Ambulatory Visit: Payer: Self-pay | Admitting: Nurse Practitioner

## 2020-06-05 NOTE — Telephone Encounter (Signed)
Approved per protocol.  Requested Prescriptions  Pending Prescriptions Disp Refills  . atorvastatin (LIPITOR) 20 MG tablet [Pharmacy Med Name: ATORVASTATIN 20 MG TABLET] 90 tablet 0    Sig: TAKE 1 TABLET BY MOUTH EVERY DAY     Cardiovascular:  Antilipid - Statins Failed - 06/05/2020  1:30 AM      Failed - Total Cholesterol in normal range and within 360 days    Cholesterol, Total  Date Value Ref Range Status  01/20/2020 241 (H) 100 - 199 mg/dL Final   Cholesterol Piccolo, Waived  Date Value Ref Range Status  11/15/2016 WILL FOLLOW  Preliminary         Failed - LDL in normal range and within 360 days    LDL Chol Calc (NIH)  Date Value Ref Range Status  01/20/2020 164 (H) 0 - 99 mg/dL Final         Failed - Triglycerides in normal range and within 360 days    Triglycerides  Date Value Ref Range Status  01/20/2020 195 (H) 0 - 149 mg/dL Final   Triglycerides Piccolo,Waived  Date Value Ref Range Status  11/15/2016 WILL FOLLOW  Preliminary         Passed - HDL in normal range and within 360 days    HDL  Date Value Ref Range Status  01/20/2020 41 >39 mg/dL Final         Passed - Patient is not pregnant      Passed - Valid encounter within last 12 months    Recent Outpatient Visits          4 months ago Type 2 diabetes mellitus with hyperglycemia, with long-term current use of insulin ([place])   [place], Jolene T, NP   7 months ago Type 2 diabetes mellitus with hyperglycemia, with long-term current use of insulin ([place])   [place], [place] T, NP   1 year ago Hyperlipidemia associated with type 2 diabetes mellitus ([place])   [place], Jolene T, NP   1 year ago Type 2 diabetes mellitus with hyperglycemia, with long-term current use of insulin ([place])   [place], Burton T, NP   1 year ago Type 2 diabetes mellitus with hyperglycemia, with long-term current use of insulin ([place])   [place], Barbaraann Faster, NP

## 2020-07-15 ENCOUNTER — Other Ambulatory Visit: Payer: Self-pay | Admitting: Internal Medicine

## 2020-07-20 ENCOUNTER — Inpatient Hospital Stay: Payer: No Typology Code available for payment source

## 2020-07-20 ENCOUNTER — Inpatient Hospital Stay (HOSPITAL_BASED_OUTPATIENT_CLINIC_OR_DEPARTMENT_OTHER): Payer: No Typology Code available for payment source | Admitting: Internal Medicine

## 2020-07-20 ENCOUNTER — Encounter: Payer: Self-pay | Admitting: Internal Medicine

## 2020-07-20 ENCOUNTER — Inpatient Hospital Stay: Payer: No Typology Code available for payment source | Attending: Internal Medicine

## 2020-07-20 VITALS — BP 105/74 | HR 92 | Temp 98.6°F | Resp 16 | Ht 64.0 in | Wt 179.0 lb

## 2020-07-20 DIAGNOSIS — Z8042 Family history of malignant neoplasm of prostate: Secondary | ICD-10-CM | POA: Insufficient documentation

## 2020-07-20 DIAGNOSIS — Z800 Family history of malignant neoplasm of digestive organs: Secondary | ICD-10-CM | POA: Diagnosis not present

## 2020-07-20 DIAGNOSIS — Z886 Allergy status to analgesic agent status: Secondary | ICD-10-CM | POA: Diagnosis not present

## 2020-07-20 DIAGNOSIS — Z90721 Acquired absence of ovaries, unilateral: Secondary | ICD-10-CM | POA: Insufficient documentation

## 2020-07-20 DIAGNOSIS — G8929 Other chronic pain: Secondary | ICD-10-CM | POA: Insufficient documentation

## 2020-07-20 DIAGNOSIS — R5383 Other fatigue: Secondary | ICD-10-CM | POA: Insufficient documentation

## 2020-07-20 DIAGNOSIS — M25561 Pain in right knee: Secondary | ICD-10-CM | POA: Diagnosis not present

## 2020-07-20 DIAGNOSIS — Z79810 Long term (current) use of selective estrogen receptor modulators (SERMs): Secondary | ICD-10-CM | POA: Insufficient documentation

## 2020-07-20 DIAGNOSIS — M549 Dorsalgia, unspecified: Secondary | ICD-10-CM | POA: Diagnosis not present

## 2020-07-20 DIAGNOSIS — Z79899 Other long term (current) drug therapy: Secondary | ICD-10-CM | POA: Insufficient documentation

## 2020-07-20 DIAGNOSIS — Z170 Estrogen receptor positive status [ER+]: Secondary | ICD-10-CM

## 2020-07-20 DIAGNOSIS — Z833 Family history of diabetes mellitus: Secondary | ICD-10-CM | POA: Insufficient documentation

## 2020-07-20 DIAGNOSIS — C50512 Malignant neoplasm of lower-outer quadrant of left female breast: Secondary | ICD-10-CM | POA: Insufficient documentation

## 2020-07-20 DIAGNOSIS — Z8379 Family history of other diseases of the digestive system: Secondary | ICD-10-CM | POA: Insufficient documentation

## 2020-07-20 DIAGNOSIS — M25562 Pain in left knee: Secondary | ICD-10-CM | POA: Insufficient documentation

## 2020-07-20 DIAGNOSIS — Z803 Family history of malignant neoplasm of breast: Secondary | ICD-10-CM | POA: Diagnosis not present

## 2020-07-20 DIAGNOSIS — Z5111 Encounter for antineoplastic chemotherapy: Secondary | ICD-10-CM | POA: Insufficient documentation

## 2020-07-20 LAB — CBC WITH DIFFERENTIAL/PLATELET
Abs Immature Granulocytes: 0.02 10*3/uL (ref 0.00–0.07)
Basophils Absolute: 0 10*3/uL (ref 0.0–0.1)
Basophils Relative: 0 %
Eosinophils Absolute: 0.1 10*3/uL (ref 0.0–0.5)
Eosinophils Relative: 1 %
HCT: 41.9 % (ref 36.0–46.0)
Hemoglobin: 13.9 g/dL (ref 12.0–15.0)
Immature Granulocytes: 0 %
Lymphocytes Relative: 24 %
Lymphs Abs: 2.1 10*3/uL (ref 0.7–4.0)
MCH: 31.7 pg (ref 26.0–34.0)
MCHC: 33.2 g/dL (ref 30.0–36.0)
MCV: 95.7 fL (ref 80.0–100.0)
Monocytes Absolute: 0.5 10*3/uL (ref 0.1–1.0)
Monocytes Relative: 6 %
Neutro Abs: 6.3 10*3/uL (ref 1.7–7.7)
Neutrophils Relative %: 69 %
Platelets: 192 10*3/uL (ref 150–400)
RBC: 4.38 MIL/uL (ref 3.87–5.11)
RDW: 13.5 % (ref 11.5–15.5)
WBC: 9 10*3/uL (ref 4.0–10.5)
nRBC: 0 % (ref 0.0–0.2)

## 2020-07-20 LAB — COMPREHENSIVE METABOLIC PANEL
ALT: 16 U/L (ref 0–44)
AST: 15 U/L (ref 15–41)
Albumin: 3.7 g/dL (ref 3.5–5.0)
Alkaline Phosphatase: 72 U/L (ref 38–126)
Anion gap: 8 (ref 5–15)
BUN: 14 mg/dL (ref 6–20)
CO2: 27 mmol/L (ref 22–32)
Calcium: 8.7 mg/dL — ABNORMAL LOW (ref 8.9–10.3)
Chloride: 101 mmol/L (ref 98–111)
Creatinine, Ser: 0.63 mg/dL (ref 0.44–1.00)
GFR, Estimated: >60 mL/min (ref >60)
Glucose, Bld: 186 mg/dL — ABNORMAL HIGH (ref 70–99)
Potassium: 3.7 mmol/L (ref 3.5–5.1)
Sodium: 136 mmol/L (ref 135–145)
Total Bilirubin: 0.5 mg/dL (ref 0.3–1.2)
Total Protein: 6.8 g/dL (ref 6.5–8.1)

## 2020-07-20 MED ORDER — ZOLEDRONIC ACID 4 MG/5ML IV CONC
4.0000 mg | Freq: Once | INTRAVENOUS | Status: DC
  Filled 2020-07-20: qty 5

## 2020-07-20 MED ORDER — GOSERELIN ACETATE 10.8 MG ~~LOC~~ IMPL
10.8000 mg | DRUG_IMPLANT | Freq: Once | SUBCUTANEOUS | Status: AC
  Administered 2020-07-20: 10.8 mg via SUBCUTANEOUS
  Filled 2020-07-20: qty 10.8

## 2020-07-20 MED ORDER — HEPARIN SOD (PORK) LOCK FLUSH 100 UNIT/ML IV SOLN
500.0000 [IU] | Freq: Once | INTRAVENOUS | Status: AC
  Administered 2020-07-20: 500 [IU]
  Filled 2020-07-20: qty 5

## 2020-07-20 MED ORDER — SODIUM CHLORIDE 0.9 % IV SOLN
INTRAVENOUS | Status: DC
  Filled 2020-07-20: qty 250

## 2020-07-20 MED ORDER — HEPARIN SOD (PORK) LOCK FLUSH 100 UNIT/ML IV SOLN
INTRAVENOUS | Status: AC
  Filled 2020-07-20: qty 5

## 2020-07-20 MED ORDER — ZOLEDRONIC ACID 4 MG/100ML IV SOLN
4.0000 mg | Freq: Once | INTRAVENOUS | Status: AC
  Administered 2020-07-20: 4 mg via INTRAVENOUS
  Filled 2020-07-20: qty 100

## 2020-07-20 NOTE — Progress Notes (Signed)
States she has been having joint pain. Has really bad hand cramps in the morning at times. Also mentioned dizziness that has been going on a lot lately. States it gets to the point where she feels like she is going to pass out. Noticed it within the past couple of weeks.

## 2020-07-20 NOTE — Progress Notes (Signed)
Carolyn Roy CONSULT NOTE  Patient Care Team: Carolyn Lick, NP as PCP - General (Nurse Practitioner) Carolyn Castilla [name], MD (General Surgery) Carolyn Jacks, RN as Registered Nurse Carolyn Ends, [name] (Obstetrics and Gynecology) Carolyn Sickle, MD as Consulting Physician (Oncology) Carolyn Ingles, RN as Case Manager (General Practice) Carolyn Sheehan, MD as Consulting Physician (Orthopedic Surgery)  CHIEF COMPLAINTS/PURPOSE OF CONSULTATION: Breast cancer  Oncology History Overview Note  # MAY 2019-  clinical stage IIIA (T3N1Mx) left breast cancer s/p biopsy on 06/14/2017. -Pathology revealed grade III invasive ductal carcinoma. -Axillary FNA revealed malignant cells c/w metastatic carcinoma. Tumor was ER + (90%), PR + (30%), Her2/neu - and Ki67 70%.  CA27.29 was 7.8 on 06/14/2017.  # She received 4 cycles of AC with Neulasta support (07/20/2017 - 08/31/2017).;  neoadjuvant Taxol on 09/14/2017.  #DEC 2019- Lumpectomy/sentinel lymph node biopsy [Dr.[name]]-complete pathologic response  # s/p RT [delayed sec to wound infection; Dr.[name]] finished RT [4/12]  # April 14th 2020- START TAM; stopped in mid-May secondary intolerance [severe migraines].  # 18th May 2020-start Arimidex [hormonal profile-postmenopausal;add Zoladex q3M]; STOPPED sec intolerance/joint pain; NOV 2021- STARTED AROMASIN; Stopped x 2 months sec to extreme fatigue/severe joint pains.   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 Secord/Berchuck]-negative for malignancy/ # DM-2- poorly controlled.   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   # PAP SMEAR- RECOMMENDED 2022- summer  -------------------------------------------  DIAGNOSIS: left breast cancer  STAGE:  III       ;GOALS: cure  CURRENT/MOST RECENT THERAPY Tam    Cancer of midline of breast, left (HCC)  06/15/2017  Initial Diagnosis   Cancer of midline of breast, left ([place])    07/20/2017 - 11/16/2017 Chemotherapy   The patient had dexamethasone (DECADRON) 4 MG tablet, 1 of 1 cycle, Start date: --, End date: -- DOXOrubicin (ADRIAMYCIN) chemo injection 122 mg, 60 mg/m2 = 122 mg, Intravenous,  Once, 4 of 4 cycles Administration: 122 mg (07/20/2017), 122 mg (08/03/2017), 122 mg (08/17/2017), 122 mg (08/31/2017) palonosetron (ALOXI) injection 0.25 mg, 0.25 mg, Intravenous,  Once, 4 of 4 cycles Administration: 0.25 mg (07/20/2017), 0.25 mg (08/03/2017), 0.25 mg (08/17/2017), 0.25 mg (08/31/2017) pegfilgrastim (NEULASTA) injection 6 mg, 6 mg, Subcutaneous, Once, 5 of 5 cycles Administration: 6 mg (07/21/2017), 6 mg (08/04/2017), 6 mg (08/18/2017), 6 mg (09/01/2017) cyclophosphamide (CYTOXAN) 1,220 mg in sodium chloride 0.9 % 250 mL chemo infusion, 600 mg/m2 = 1,220 mg, Intravenous,  Once, 4 of 4 cycles Administration: 1,220 mg (07/20/2017), 1,220 mg (08/03/2017), 1,220 mg (08/17/2017), 1,220 mg (08/31/2017) PACLitaxel (TAXOL) 162 mg in sodium chloride 0.9 % 250 mL chemo infusion (</= 71m/m2), 80 mg/m2 = 162 mg, Intravenous,  Once, 10 of 12 cycles Dose modification: 65 mg/m2 (original dose 80 mg/m2, Cycle 13, Reason: Provider Judgment, Comment: neuropathy) Administration: 162 mg (09/14/2017), 162 mg (09/21/2017), 162 mg (09/28/2017), 162 mg (10/05/2017), 162 mg (10/12/2017), 162 mg (10/19/2017), 162 mg (10/26/2017), 132 mg (11/02/2017), 132 mg (11/09/2017), 132 mg (11/16/2017) fosaprepitant (EMEND) 150 mg, dexamethasone (DECADRON) 12 mg in sodium chloride 0.9 % 145 mL IVPB, , Intravenous,  Once, 4 of 4 cycles Administration:  (07/20/2017),  (08/03/2017),  (08/17/2017),  (08/31/2017)   for chemotherapy treatment.     Malignant neoplasm of lower-outer quadrant of left breast of female, estrogen receptor positive ([place]  11/08/2017 Initial Diagnosis   Malignant neoplasm of lower-outer quadrant of  left breast of female, estrogen receptor positive  ([place])     HISTORY OF PRESENTING ILLNESS:   Carolyn Roy 55 y.o.  female with a history of stage III ER PR positive HER-2/neu negative breast cancer currently on tamoxifen 10 mg a day [intolerance] plus Zoladex is here for follow-up.  Patient continues to have chronic joint pains; noted to have worsening knee pain.  No recent trauma.   Denies any new lumps or bumps.  Denies any chest pain or shortness of breath or cough.  Review of Systems  Constitutional:  Positive for malaise/fatigue. Negative for chills, diaphoresis, fever and weight loss.  HENT:  Negative for nosebleeds and sore throat.   Eyes:  Negative for double vision.  Respiratory:  Negative for cough, hemoptysis, sputum production, shortness of breath and wheezing.   Cardiovascular:  Negative for chest pain, palpitations, orthopnea and leg swelling.  Gastrointestinal:  Negative for abdominal pain, blood in stool, constipation, diarrhea, heartburn, melena, nausea and vomiting.  Genitourinary:  Negative for dysuria, frequency and urgency.  Musculoskeletal:  Positive for back pain and joint pain.  Skin: Negative.  Negative for itching and rash.  Neurological:  Positive for tingling. Negative for dizziness, focal weakness, weakness and headaches.  Endo/Heme/Allergies:  Does not bruise/bleed easily.  Psychiatric/Behavioral:  Negative for depression. The patient is not nervous/anxious and does not have insomnia.     MEDICAL HISTORY:  Past Medical History:  Diagnosis Date   Allergy    Breast cancer ([place])    Cancer ([place]) 06/15/2017   5.1 cm, T3,N1 (clinical): ER/ PR positive, Her 2 neu not overexpressed, High Ki 67. Neuoadjuvant chemotherapy.    Depression    Diabetes mellitus without complication ([place]) 1025   Edema of left upper extremity    Endometriosis    Family history of breast cancer    Headache    migraines   Hyperlipidemia    Lymphedema of left arm    Ovarian mass    Personal history of chemotherapy     Personal history of radiation therapy    Pneumonia    2018    SURGICAL HISTORY: Past Surgical History:  Procedure Laterality Date   AXILLARY LYMPH NODE BIOPSY Left 07/14/2017   Procedure: INSERTION GEL MARK CLIP LEFT AXILLA;  Surgeon: Robert Bellow, MD;  Location: [facility] ORS;  Service: General;  Laterality: Left;   BREAST BIOPSY Left    Dr Orlene Och BREAST METASTATIC CARCINOMA   BREAST LUMPECTOMY Left 01/12/2018   COLONOSCOPY WITH PROPOFOL N/A 12/27/2019   Procedure: COLONOSCOPY WITH PROPOFOL;  Surgeon: Virgel Manifold, MD;  Location: [facility] END[facility]COPY;  Service: Endoscopy;  Laterality: N/A;   OOPHORECTOMY     PARTIAL MASTECTOMY WITH NEEDLE LOCALIZATION Left 01/12/2018   Procedure: PARTIAL MASTECTOMY WITH NEEDLE LOCALIZATION;  Surgeon: Robert Bellow, MD;  Location: [facility] OR[facility];  Service: General;  Laterality: Left;   PORTACATH PLACEMENT Right 07/14/2017   Procedure: INSERTION PORT-A-CATH;  Surgeon: Robert Bellow, MD;  Location: [facility] ORS;  Service: General;  Laterality: Right;   SENTINEL NODE BIOPSY Left 01/12/2018   Procedure: SENTINEL NODE BIOPSY;  Surgeon: Robert Bellow, MD;  Location: [facility] OR[facility];  Service: General;  Laterality: Left;   TUBAL LIGATION      SOCIAL HISTORY: Social History   Socioeconomic History   Marital status: Married    Spouse name: Not on file   Number of children: Not on file   Years of education: Not on file   Highest education level: Not on  file  Occupational History   Not on file  Tobacco Use   Smoking status: Every Day    Packs/day: 1.00    Years: 11.00    Pack years: 11.00    Types: Cigarettes   Smokeless tobacco: Former    Types: Snuff  Vaping Use   Vaping Use: Never used  Substance and Sexual Activity   Alcohol use: No    Alcohol/week: 0.0 standard drinks   Drug use: No   Sexual activity: Yes  Other Topics Concern   Not on file  Social History Narrative   Not on file   Social Determinants of Health   Financial  Resource Strain: Not on file  Food Insecurity: Not on file  Transportation Needs: Not on file  Physical Activity: Not on file  Stress: Not on file  Social Connections: Not on file  Intimate Partner Violence: Not on file    FAMILY HISTORY: Family History  Problem Relation Age of Onset   Colon cancer Mother    Cancer Mother    Other Father        No info about father or paternal relatives   Diabetes Brother    Pancreatitis Brother    Prostate cancer Brother 41       currently 35 / maternal half-brother   Breast cancer Maternal Grandmother 33       deceased 9s   Colon cancer Maternal Grandmother    Breast cancer Maternal Aunt 80       currently 55   Breast cancer Other 33       mother's sister; deceased 52   Breast cancer Other        mother's sister; age at dx unknown    ALLERGIES:  is allergic to aspirin.  MEDICATIONS:  Current Outpatient Medications  Medication Sig Dispense Refill   aspirin-acetaminophen-caffeine (EXCEDRIN MIGRAINE) 250-250-65 MG tablet Take 2 tablets by mouth daily as needed for headache.      atorvastatin (LIPITOR) 20 MG tablet TAKE 1 TABLET BY MOUTH EVERY DAY 90 tablet 0   Dulaglutide (TRULICITY) 7.25 DG/6.4QI SOPN Inject 0.75 mg into the skin once a week. 6 mL 4   DULoxetine (CYMBALTA) 60 MG capsule Take 60 mg by mouth daily.     glucose blood (ONE TOUCH ULTRA TEST) test strip Use up to 4 times/day 100 each 12   goserelin (ZOLADEX) 3.6 MG injection Inject 3.6 mg into the skin every 28 (twenty-eight) days.     Insulin Glargine (BASAGLAR KWIKPEN) 100 UNIT/ML INJECT 10 UNITS TOTAL INTO THE SKIN AT BEDTIME. 15 mL 2   Insulin Pen Needle 31G X 8 MM MISC Use daily to administer insulin 100 each 12   JARDIANCE 25 MG TABS tablet Take 25 mg by mouth daily.     oxyCODONE (OXY IR/ROXICODONE) 5 MG immediate release tablet Take 1 tablet (5 mg total) by mouth 2 (two) times daily as needed for severe pain. Must last 30 days 60 tablet 0   oxyCODONE (OXY  IR/ROXICODONE) 5 MG immediate release tablet Take 1 tablet (5 mg total) by mouth 2 (two) times daily as needed for severe pain. Must last 30 days 60 tablet 0   oxyCODONE (OXY IR/ROXICODONE) 5 MG immediate release tablet Take 1 tablet (5 mg total) by mouth 2 (two) times daily as needed for severe pain. Must last 30 days 60 tablet 0   pregabalin (LYRICA) 150 MG capsule Take 1 capsule (150 mg total) by mouth 2 (two) times daily. 60 capsule  2   sertraline (ZOLOFT) 50 MG tablet Take 50 mg by mouth every morning.     tamoxifen (NOLVADEX) 10 MG tablet Take 1 tablet (10 mg total) by mouth daily. 90 tablet 1   traZODone (DESYREL) 50 MG tablet Take 50 mg by mouth at bedtime as needed.     Vitamin D, Ergocalciferol, (DRISDOL) 1.25 MG (50000 UNIT) CAPS capsule TAKE 1 CAPSULE BY MOUTH ONE TIME PER WEEK 12 capsule 1   Alpha-Lipoic Acid 600 MG CAPS Take 1 capsule (600 mg total) by mouth daily. (Patient not taking: Reported on 07/20/2020) 30 capsule 2   No current facility-administered medications for this visit.   Facility-Administered Medications Ordered in Other Visits  Medication Dose Route Frequency Provider Last Rate Last Admin   heparin lock flush 100 unit/mL  500 Units Intravenous Once Corcoran, Melissa C, MD       sodium chloride flush (NS) 0.9 % injection 10 mL  10 mL Intravenous Once Lequita Asal, MD          .  PHYSICAL EXAMINATION: ECOG PERFORMANCE STATUS: 0 - Asymptomatic  Vitals:   07/20/20 1308  BP: 105/74  Pulse: 92  Resp: 16  Temp: 98.6 F (37 C)  SpO2: 95%   Filed Weights   07/20/20 1308  Weight: 179 lb (81.2 kg)    Physical Exam HENT:     Head: Normocephalic and atraumatic.     Mouth/Throat:     Pharynx: No oropharyngeal exudate.  Eyes:     Pupils: Pupils are equal, round, and reactive to light.  Cardiovascular:     Rate and Rhythm: Normal rate and regular rhythm.  Pulmonary:     Effort: No respiratory distress.     Breath sounds: No wheezing.  Abdominal:      General: Bowel sounds are normal. There is no distension.     Palpations: Abdomen is soft. There is no mass.     Tenderness: no abdominal tenderness There is no guarding or rebound.  Musculoskeletal:        General: No tenderness. Normal range of motion.     Cervical back: Normal range of motion and neck supple.  Skin:    General: Skin is warm.     Comments: Right and left BREAST exam [in the presence of nurse]- no unusual skin changes or dominant masses felt. Surgical scars noted.    Neurological:     Mental Status: She is alert and oriented to person, place, and time.  Psychiatric:        Mood and Affect: Affect normal.     LABORATORY DATA:  I have reviewed the data as listed Lab Results  Component Value Date   WBC 9.0 07/20/2020   HGB 13.9 07/20/2020   HCT 41.9 07/20/2020   MCV 95.7 07/20/2020   PLT 192 07/20/2020   Recent Labs    10/18/19 1418 10/18/19 1418 10/21/19 1316 01/22/20 1301 04/22/20 1345 07/20/20 1254  NA 138   < > 138 140 138 136  K 4.0  --  3.6 4.2 3.4* 3.7  CL 101  --  103 102 104 101  CO2 23  --  _0 GLUCOSE 138*   < > 162* 159* 231* 186*  BUN 14   < > _1 CREATININE 0.73  --  0.60 0.58 0.63 0.63  CALCIUM 9.5  --  8.7* 9.2 8.7* 8.7*  GFRNONAA 95  --  >60 >60 >60 >60  GFRAA  110  --  >60  --   --   --   PROT  --    < > 7.2 7.3 6.8 6.8  ALBUMIN  --    < > 3.9 3.9 3.8 3.7  AST  --    < > 14* 11* 13* 15  ALT  --    < > _0 ALKPHOS  --    < > 80 93 80 72  BILITOT  --    < > 0.6 0.8 0.7 0.5   < > = values in this interval not displayed.    RADIOGRAPHIC STUDIES: I have personally reviewed the radiological images as listed and agreed with the findings in the report. No results found.  ASSESSMENT & PLAN:   Malignant neoplasm of lower-outer quadrant of left breast of female, estrogen receptor positive ([place]) # breast cancer Stage III ER/PR pos her 2 NEG. currently on armoasin+ Zoldadex.  No clinical evidence of  recurrence.  Stable.Zoladex today.  #Patient has poor tolerance to Aromasin [joint pains extreme fatigue]-I think is reasonable to try tamoxifen 10 mg [lower dose; previous intolerance to 20 mg;].   # Depression/ Insomnia-stable- On cymblata/Zoloft [DI-none as per uptodate*] Dr.kapur]  # Joint pains-multifactorial-likley worsening by Tam; recommend osteobiflex; left knee pain recommend evaluation with orthopedics.  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 Dr.B  All questions were answered. The patient knows to call the clinic with any problems, questions or concerns.    Carolyn Sickle, MD 07/26/2020 5:16 PM

## 2020-07-20 NOTE — Assessment & Plan Note (Addendum)
#   breast cancer Stage III ER/PR pos her 2 NEG. currently on armoasin+ Zoldadex.  No clinical evidence of recurrence.  Stable.Zoladex today.  #Patient has poor tolerance to Aromasin [joint pains extreme fatigue]-I think is reasonable to try tamoxifen 10 mg [lower dose; previous intolerance to 20 mg;].   # Depression/ Insomnia-stable- On cymblata/Zoloft [DI-none as per uptodate*] Dr.kapur]  # Joint pains-multifactorial-likley worsening by Tam; recommend osteobiflex; left knee pain recommend evaluation with orthopedics.  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 Dr.B

## 2020-07-20 NOTE — Patient Instructions (Signed)
[facility]  Discharge Instructions: Thank you for choosing [facility] to provide your oncology and hematology care.  If you have a lab appointment with the [facility], please go directly to the [facility] and check in at the registration area.  Wear comfortable clothing and clothing appropriate for easy access to any Portacath or PICC line.   We strive to give you quality time with your provider. You may need to reschedule your appointment if you arrive late (15 or more minutes).  Arriving late affects you and other patients whose appointments are after yours.  Also, if you miss three or more appointments without notifying the office, you may be dismissed from the clinic at the provider's discretion.      For prescription refill requests, have your pharmacy contact our office and allow 72 hours for refills to be completed.    Today you received the following : Zometa / Zoladex   To help prevent nausea and vomiting after your treatment, we encourage you to take your nausea medication as directed.  BELOW ARE SYMPTOMS THAT SHOULD BE REPORTED IMMEDIATELY: *FEVER GREATER THAN 100.4 F (38 C) OR HIGHER *CHILLS OR SWEATING *NAUSEA AND VOMITING THAT IS NOT CONTROLLED WITH YOUR NAUSEA MEDICATION *UNUSUAL SHORTNESS OF BREATH *UNUSUAL BRUISING OR BLEEDING *URINARY PROBLEMS (pain or burning when urinating, or frequent urination) *BOWEL PROBLEMS (unusual diarrhea, constipation, pain near the anus) TENDERNESS IN MOUTH AND THROAT WITH OR WITHOUT PRESENCE OF ULCERS (sore throat, sores in mouth, or a toothache) UNUSUAL RASH, SWELLING OR PAIN  UNUSUAL VAGINAL DISCHARGE OR ITCHING   Items with * indicate a potential emergency and should be followed up as soon as possible or go to the Emergency Department if any problems should occur.  Please show the CHEMOTHERAPY ALERT CARD or IMMUNOTHERAPY ALERT CARD at check-in to the Emergency Department and  triage nurse.  Should you have questions after your visit or need to cancel or reschedule your appointment, please contact [facility]  [phone] and follow the prompts.  Office hours are 8:00 a.m. to 4:30 p.m. Monday - Friday. Please note that voicemails left after 4:00 p.m. may not be returned until the following business day.  We are closed weekends and major holidays. You have access to a nurse at all times for urgent questions. Please call the main number to the clinic [phone] and follow the prompts.  For any non-urgent questions, you may also contact your provider using MyChart. We now offer e-Visits for anyone 21 and older to request care online for non-urgent symptoms. For details visit [email or web address].   Also download the MyChart app! Go to the app store, search "MyChart", open the app, select [facility], and log in with your MyChart username and password.  Due to Covid, a mask is required upon entering the hospital/clinic. If you do not have a mask, one will be given to you upon arrival. For doctor visits, patients may have 1 support person aged 76 or older with them. For treatment visits, patients cannot have anyone with them due to current Covid guidelines and our immunocompromised population.

## 2020-07-21 LAB — CANCER ANTIGEN 27.29: CA 27.29: 10.3 U/mL (ref 0.0–38.6)

## 2020-07-26 ENCOUNTER — Encounter: Payer: Self-pay | Admitting: Internal Medicine

## 2020-07-27 ENCOUNTER — Other Ambulatory Visit: Payer: Self-pay

## 2020-07-27 ENCOUNTER — Ambulatory Visit: Payer: No Typology Code available for payment source | Attending: Pain Medicine | Admitting: Pain Medicine

## 2020-07-27 ENCOUNTER — Encounter: Payer: Self-pay | Admitting: Pain Medicine

## 2020-07-27 VITALS — BP 111/68 | HR 92 | Temp 97.4°F | Resp 16 | Ht 64.0 in | Wt 179.0 lb

## 2020-07-27 DIAGNOSIS — M79642 Pain in left hand: Secondary | ICD-10-CM | POA: Insufficient documentation

## 2020-07-27 DIAGNOSIS — M79641 Pain in right hand: Secondary | ICD-10-CM | POA: Diagnosis present

## 2020-07-27 DIAGNOSIS — M25561 Pain in right knee: Secondary | ICD-10-CM

## 2020-07-27 DIAGNOSIS — M25552 Pain in left hip: Secondary | ICD-10-CM

## 2020-07-27 DIAGNOSIS — M79672 Pain in left foot: Secondary | ICD-10-CM | POA: Insufficient documentation

## 2020-07-27 DIAGNOSIS — G893 Neoplasm related pain (acute) (chronic): Secondary | ICD-10-CM | POA: Diagnosis present

## 2020-07-27 DIAGNOSIS — M25551 Pain in right hip: Secondary | ICD-10-CM | POA: Diagnosis present

## 2020-07-27 DIAGNOSIS — Z79891 Long term (current) use of opiate analgesic: Secondary | ICD-10-CM | POA: Insufficient documentation

## 2020-07-27 DIAGNOSIS — G8929 Other chronic pain: Secondary | ICD-10-CM | POA: Diagnosis present

## 2020-07-27 DIAGNOSIS — M25562 Pain in left knee: Secondary | ICD-10-CM | POA: Diagnosis present

## 2020-07-27 DIAGNOSIS — Z79899 Other long term (current) drug therapy: Secondary | ICD-10-CM | POA: Diagnosis present

## 2020-07-27 DIAGNOSIS — G894 Chronic pain syndrome: Secondary | ICD-10-CM | POA: Insufficient documentation

## 2020-07-27 DIAGNOSIS — M79671 Pain in right foot: Secondary | ICD-10-CM

## 2020-07-27 MED ORDER — OXYCODONE HCL 5 MG PO TABS: 5.0000 mg | ORAL_TABLET | Freq: Two times a day (BID) | ORAL | 0 refills | Status: DC | PRN

## 2020-07-27 NOTE — Progress Notes (Signed)
Nursing Pain Medication Assessment:  Safety precautions to be maintained throughout the outpatient stay will include: orient to surroundings, keep bed in low position, maintain call bell within reach at all times, provide assistance with transfer out of bed and ambulation.  Medication Inspection Compliance: Pill count conducted under aseptic conditions, in front of the patient. Neither the pills nor the bottle was removed from the patient's sight at any time. Once count was completed pills were immediately returned to the patient in their original bottle.  Medication: Oxycodone IR Pill/Patch Count:  11 of 60 pills remain Pill/Patch Appearance: Markings consistent with prescribed medication Bottle Appearance: Standard pharmacy container. Clearly labeled. Filled Date: 05 / 27 / 2022 Last Medication intake:  Today

## 2020-07-27 NOTE — Progress Notes (Signed)
PROVIDER NOTE: Information contained herein reflects review and annotations entered in association with encounter. Interpretation of such information and data should be left to medically-trained personnel. Information provided to patient can be located elsewhere in the medical record under "Patient Instructions". Document created using STT-dictation technology, any transcriptional errors that may result from process are unintentional.    Patient: Carolyn Roy  Service Category: E/M  Provider: Gaspar Cola, MD  DOB: May 20, 1965  DOS: 07/27/2020  Specialty: Interventional Pain Management  MRN: [id number]  Setting: Ambulatory outpatient  PCP: [name], NP  Type: Established Patient    Referring Provider: [name], NP  Location: Office  Delivery: Face-to-face     HPI  [name], a 55 y.o. year old female, is here today because of her Chronic pain syndrome [G89.4]. [name] primary complain today is Joint Pain (All joints, knees and feet are the worse, has been told this is a side affect of tamoxifen ), Knee Pain (Bilateral, right is worse and is more affected on the inside of leg ), and Back Pain (Lumbar, midline ) Last encounter: My last encounter with her was on 04/29/2020. Pertinent problems: [name] has Chronic fatigue; Cancer of midline of breast, left ([place]); Family history of breast cancer; Chemotherapy-induced neuropathy ([place]); Malignant neoplasm of lower-outer quadrant of left breast of female, estrogen receptor positive ([place]); Chronic feet pain (1ry area of Pain) (Bilateral) (R>L); Neuropathic pain of feet (Bilateral); Chronic knee pain (2ry area of Pain) (Bilateral) (R>L); Chronic hand pain (3ry area of Pain) (Bilateral) (R>L); Chronic pain syndrome; Chronic hip pain (4th area of Pain) (Bilateral) (R>L); Chronic upper extremity pain (3ry area of Pain) (Bilateral) (R>L); Cancer-related pain; Neuropathic pain; Osteoarthritis of knee (Right); Chronic low back  pain (Bilateral (L>R) w/o sciatica; Chronic hip pain (Left); Lumbar facet syndrome (Bilateral) (L>R); Idiopathic scoliosis; Chronic sacroiliac joint pain (Left); Chronic pain of knee (Right); Abnormal MRI, lumbar spine (04/20/2018); Lumbar facet arthropathy; Spondylosis without myelopathy or radiculopathy, lumbosacral region; DDD (degenerative disc disease), lumbosacral; Numbness and tingling of upper extremity (C6/C7 dermatomes) (Right); Cervicalgia; and Cervical radiculitis (Right) on their pertinent problem list. Pain Assessment: Severity of   is reported as a 3 /10. Location: Back (joint, knee, back, hands) Lower, Mid/back pain into right hip and leg. Onset: More than a month ago. Quality: Discomfort, Constant, Burning. Timing: Constant. Modifying factor(s): medications, ibuprofen. Vitals:  height is '5\' 4"'  (1.626 m) and weight is 179 lb (81.2 kg). Her temporal temperature is 97.4 F (36.3 C) (abnormal). Her blood pressure is 111/68 and her pulse is 92. Her respiration is 16 and oxygen saturation is 97%.   Reason for encounter: medication management.   The patient indicates doing well with the current medication regimen. No adverse reactions or side effects reported to the medications.  Apparently one of the patient's coworkers committed suicide and she is obviously depressed.  UDS updated today.  Having right knee pain. We'll update her XR and schedule her for IA inj.  RTCB: 11/01/2020 Nonopioids transfer 02/03/2020: Pregabalin (Lyrica) and alpha-lipoic acid  Pharmacotherapy Assessment  Analgesic: Oxycodone IR 5 mg, 1 tab PO BID (10 mg/day of oxycodone) (Average: 2 tabs/day = 10 mg/day of oxycodone) MME/day: 15 mg/day.   Monitoring: [place] PMP: PDMP reviewed during this encounter.       Pharmacotherapy: No side-effects or adverse reactions reported. Compliance: No problems identified. Effectiveness: Clinically acceptable.  Janett Billow, RN  07/27/2020  2:42 PM  Sign when Signing  Visit  Nursing Pain Medication Assessment:  Safety precautions to be maintained throughout the outpatient stay will include: orient to surroundings, keep bed in low position, maintain call bell within reach at all times, provide assistance with transfer out of bed and ambulation.  Medication Inspection Compliance: Pill count conducted under aseptic conditions, in front of the patient. Neither the pills nor the bottle was removed from the patient's sight at any time. Once count was completed pills were immediately returned to the patient in their original bottle.  Medication: Oxycodone IR Pill/Patch Count:  11 of 60 pills remain Pill/Patch Appearance: Markings consistent with prescribed medication Bottle Appearance: Standard pharmacy container. Clearly labeled. Filled Date: 05 / 27 / 2022 Last Medication intake:  Today    UDS:  Summary  Date Value Ref Range Status  09/18/2019 Note  Final    Comment:    ==================================================================== [employer] Select 13 (MW) ==================================================================== Test                             Result       Flag       Units  Drug Present and Declared for Prescription Verification   Oxazepam                       63           EXPECTED   ng/mg creat   Temazepam                      182          EXPECTED   ng/mg creat    Oxazepam and temazepam are expected metabolites of diazepam.    Oxazepam is also an expected metabolite of other benzodiazepine    drugs, including chlordiazepoxide, prazepam, clorazepate, halazepam,    and temazepam.  Oxazepam and temazepam are available as scheduled    prescription medications.    Oxycodone                      1417         EXPECTED   ng/mg creat   Oxymorphone                    545          EXPECTED   ng/mg creat   Noroxycodone                   2620         EXPECTED   ng/mg creat   Noroxymorphone                 188          EXPECTED   ng/mg creat     Sources of oxycodone are scheduled prescription medications.    Oxymorphone, noroxycodone, and noroxymorphone are expected    metabolites of oxycodone. Oxymorphone is also available as a    scheduled prescription medication.  Drug Present not Declared for Prescription Verification   Alcohol, Ethyl                 0.043        UNEXPECTED g/dL    Sources of ethyl alcohol include alcoholic beverages or as a    fermentation product of glucose; glucose is present in this specimen.    Interpret result with caution, as the presence of ethyl alcohol is  likely due, at least in part, to fermentation of glucose.  ==================================================================== Test                      Result    Flag   Units      Ref Range   Creatinine              82               mg/dL      >=20 ==================================================================== Declared Medications:  The flagging and interpretation on this report are based on the  following declared medications.  Unexpected results may arise from  inaccuracies in the declared medications.   **Note: The testing scope of this panel includes these medications:   Oxycodone  Temazepam (Restoril)   **Note: The testing scope of this panel does not include the  following reported medications:   Acetaminophen (Excedrin)  Albuterol (Ventolin HFA)  Anastrozole (Arimidex)  Aspirin (Excedrin)  Atorvastatin (Lipitor)  Caffeine (Excedrin)  Canagliflozin (Invokana)  Duloxetine (Cymbalta)  Hydroxyzine (Vistaril)  Ondansetron (Zofran)  Pregabalin (Lyrica)  Prilocaine (EMLA)  Prochlorperazine (Compazine)  Semaglutide (Ozempic)  Sertraline (Zoloft)  Topical Lidocaine (EMLA) ==================================================================== For clinical consultation, please call [phone]. ====================================================================      ROS  Constitutional: Denies any fever or  chills Gastrointestinal: No reported hemesis, hematochezia, vomiting, or acute GI distress Musculoskeletal: Denies any acute onset joint swelling, redness, loss of ROM, or weakness Neurological: No reported episodes of acute onset apraxia, aphasia, dysarthria, agnosia, amnesia, paralysis, loss of coordination, or loss of consciousness  Medication Review  Alpha-Lipoic Acid, Basaglar KwikPen, DULoxetine, Dulaglutide, Insulin Pen Needle, Vitamin D (Ergocalciferol), aspirin-acetaminophen-caffeine, atorvastatin, empagliflozin, glucose blood, goserelin, oxyCODONE, pregabalin, sertraline, tamoxifen, and traZODone  History Review  Allergy: [name] is allergic to aspirin. Drug: [name]  reports no history of drug use. Alcohol:  reports no history of alcohol use. Tobacco:  reports that she has been smoking cigarettes. She has a 11.00 pack-year smoking history. She has quit using smokeless tobacco.  Her smokeless tobacco use included snuff. Social: [name]  reports that she has been smoking cigarettes. She has a 11.00 pack-year smoking history. She has quit using smokeless tobacco.  Her smokeless tobacco use included snuff. She reports that she does not drink alcohol and does not use drugs. Medical:  has a past medical history of Allergy, Breast cancer ([place]), Cancer ([place]) (06/15/2017), Depression, Diabetes mellitus without complication ([place]) (3545), Edema of left upper extremity, Endometriosis, Family history of breast cancer, Headache, Hyperlipidemia, Lymphedema of left arm, Ovarian mass, Personal history of chemotherapy, Personal history of radiation therapy, and Pneumonia. Surgical: [name]  has a past surgical history that includes Oophorectomy; Tubal ligation; Portacath placement (Right, 07/14/2017); Axillary lymph node biopsy (Left, 07/14/2017); Breast lumpectomy (Left, 01/12/2018); Breast biopsy (Left); Partial mastectomy with needle localization (Left, 01/12/2018); Sentinel node biopsy (Left,  01/12/2018); and Colonoscopy with propofol (N/A, 12/27/2019). Family: family history includes Breast cancer in an other family member; Breast cancer (age of onset: 16) in her maternal grandmother; Breast cancer (age of onset: 58) in an other family member; Breast cancer (age of onset: 73) in her maternal aunt; Cancer in her mother; Colon cancer in her maternal grandmother and mother; Diabetes in her brother; Other in her father; Pancreatitis in her brother; Prostate cancer (age of onset: 10) in her brother.  Laboratory Chemistry Profile   Renal Lab Results  Component Value Date   BUN 14 07/20/2020   CREATININE 0.63 07/20/2020  BCR 19 10/18/2019   GFRAA >60 10/21/2019   GFRNONAA >60 07/20/2020     Hepatic Lab Results  Component Value Date   AST 15 07/20/2020   ALT 16 07/20/2020   ALBUMIN 3.7 07/20/2020   ALKPHOS 72 07/20/2020     Electrolytes Lab Results  Component Value Date   NA 136 07/20/2020   K 3.7 07/20/2020   CL 101 07/20/2020   CALCIUM 8.7 (L) 07/20/2020   MG 1.9 11/23/2017     Bone Lab Results  Component Value Date   VD25OH 20.0 (L) 12/13/2017     Inflammation (CRP: Acute Phase) (ESR: Chronic Phase) Lab Results  Component Value Date   CRP 7 01/22/2018   ESRSEDRATE 27 01/22/2018       Note: Above Lab results reviewed.  Recent Imaging Review  DG Fluoro Guide CV Line Right INDICATION: Unable to aspirate from right chest port.  EXAM: Portogram  MEDICATIONS: None  ANESTHESIA/SEDATION: None  COMPLICATIONS: None immediate.  PROCEDURE: Right chest port accessed in sterile fashion. Scout image demonstrated the right subclavian chest port in appropriate position terminating at the level the right atrium.  Contrast administered through the catheter showed no discontinuous segments.  Filling defect at the tip of the catheter is consistent with a fibrin sheath. There is flow of contrast into the right atrium.  IMPRESSION: Right subclavian chest  port in appropriate position. Fibrin sheath at the tip of the catheter is likely the reason the port cannot be aspirated.  Electronically Signed   By: Miachel Roux M.D.   On: 05/07/2020 08:03 Note: Reviewed        Physical Exam  General appearance: Well nourished, well developed, and well hydrated. In no apparent acute distress Mental status: Alert, oriented x 3 (person, place, & time)       Respiratory: No evidence of acute respiratory distress Eyes: PERLA Vitals: BP 111/68 (BP Location: Right Arm, Patient Position: Sitting, Cuff Size: Large)   Pulse 92   Temp (!) 97.4 F (36.3 C) (Temporal)   Resp 16   Ht '5\' 4"'  (1.626 m)   Wt 179 lb (81.2 kg)   LMP  (LMP Unknown) Comment: LAST PERIOD IN MAY 2019 WHEN SHE STARTED CHEMO  SpO2 97%   BMI 30.73 kg/m  BMI: Estimated body mass index is 30.73 kg/m as calculated from the following:   Height as of this encounter: '5\' 4"'  (1.626 m).   Weight as of this encounter: 179 lb (81.2 kg). Ideal: Ideal body weight: 54.7 kg (120 lb 9.5 oz) Adjusted ideal body weight: 65.3 kg (143 lb 15.3 oz)  Assessment   Status Diagnosis  Controlled Controlled Controlled 1. Chronic pain syndrome   2. Chronic feet pain (1ry area of Pain) (Bilateral) (R>L)   3. Chronic knee pain (2ry area of Pain) (Bilateral) (R>L)   4. Chronic hand pain (3ry area of Pain) (Bilateral) (R>L)   5. Chronic hip pain (4th area of Pain) (Bilateral) (R>L)   6. Cancer-related pain   7. Pharmacologic therapy   8. Chronic use of opiate for therapeutic purpose   9. Chronic pain of knee (Right)      Updated Problems: No problems updated.  Plan of Care  Problem-specific:  No problem-specific Assessment & Plan notes found for this encounter.  [name] has a current medication list which includes the following long-term medication(s): atorvastatin, basaglar Claiborne Rigg, [START ON 08/03/2020] oxycodone, [START ON 09/02/2020] oxycodone, [START ON 10/02/2020] oxycodone,  pregabalin, sertraline, and alpha-lipoic acid.  Pharmacotherapy (Medications Ordered): Meds ordered this encounter  Medications   oxyCODONE (OXY IR/ROXICODONE) 5 MG immediate release tablet    Sig: Take 1 tablet (5 mg total) by mouth 2 (two) times daily as needed for severe pain. Must last 30 days    Dispense:  60 tablet    Refill:  0    Not a duplicate. Do NOT delete! Dispense 1 day early if closed on refill date. Avoid benzodiazepines within 8 hours of opioids. Do not send refill requests.   oxyCODONE (OXY IR/ROXICODONE) 5 MG immediate release tablet    Sig: Take 1 tablet (5 mg total) by mouth 2 (two) times daily as needed for severe pain. Must last 30 days    Dispense:  60 tablet    Refill:  0    Not a duplicate. Do NOT delete! Dispense 1 day early if closed on refill date. Avoid benzodiazepines within 8 hours of opioids. Do not send refill requests.   oxyCODONE (OXY IR/ROXICODONE) 5 MG immediate release tablet    Sig: Take 1 tablet (5 mg total) by mouth 2 (two) times daily as needed for severe pain. Must last 30 days    Dispense:  60 tablet    Refill:  0    Not a duplicate. Do NOT delete! Dispense 1 day early if closed on refill date. Avoid benzodiazepines within 8 hours of opioids. Do not send refill requests.    Orders:  Orders Placed This Encounter  Procedures   KNEE INJECTION    Local Anesthetic & Steroid injection.    Standing Status:   Future    Standing Expiration Date:   10/27/2020    Scheduling Instructions:     Side: Right-sided     Sedation: None     Timeframe: As soon as schedule allows    Order Specific Question:   Where will this procedure be performed?    Answer:   [facility]   DG Knee 1-2 Views Right    Standing Status:   Future    Standing Expiration Date:   08/26/2020    Scheduling Instructions:     Imaging must be done as soon as possible. Inform patient that order will expire within 30 days and I will not renew it.    Order Specific Question:    Reason for Exam (SYMPTOM  OR DIAGNOSIS REQUIRED)    Answer:   Right knee pain/arthralgia    Order Specific Question:   Is the patient pregnant?    Answer:   No    Order Specific Question:   Preferred imaging location?    Answer:   [facility]    Order Specific Question:   Call Results- Best Contact Number?    Answer:   [phone] ([facility])    Order Specific Question:   Release to patient    Answer:   Immediate   Tox[employer]SU[employer] Select 13 (MW), Urine    Volume: 30 ml(s). Minimum 3 ml of urine is needed. Document temperature of fresh sample. Indications: Long term (current) use of opiate analgesic (A76.811)    Order Specific Question:   Release to patient    Answer:   Immediate    Follow-up plan:   Return in about 3 months (around 11/01/2020) for evaluation day (F2F) (MM), in addition, Procedure (no sedation): (R) IA Knee inj..      Considering:   Possible bilateral lumbar facet RFA  Diagnostic lumbar sympathetic block  Diagnostic right genicular NB  Possible right  genicular nerve RFA    Palliative PRN treatment(s):   Therapeutic right IA knee joint injection #3 (steroids)  Diagnostic bilateral lumbar facet block #2  Therapeutic/palliative right knee Hyalgan inj. S2/N1     Recent Visits Date Type Provider Dept  04/29/20 Office Visit Milinda Pointer, MD [facility]  Showing recent visits within past 90 days and meeting all other requirements Today's Visits Date Type Provider Dept  07/27/20 Office Visit Milinda Pointer, MD [facility]  Showing today's visits and meeting all other requirements Future Appointments No visits were found meeting these conditions. Showing future appointments within next 90 days and meeting all other requirements I discussed the assessment and treatment plan with the patient. The patient was provided an opportunity to ask questions and all were answered. The patient agreed with the plan and demonstrated an  understanding of the instructions.  Patient advised to call back or seek an in-person evaluation if the symptoms or condition worsens.  Duration of encounter: 30 minutes.  Note by: Gaspar Cola, MD Date: 07/27/2020; Time: 3:20 PM

## 2020-07-27 NOTE — Patient Instructions (Signed)
____________________________________________________________________________________________  Medication Rules  Purpose: To inform patients, and their family members, of our rules and regulations.  Applies to: All patients receiving prescriptions (written or electronic).  Pharmacy of record: Pharmacy where electronic prescriptions will be sent. If written prescriptions are taken to a different pharmacy, please inform the nursing staff. The pharmacy listed in the electronic medical record should be the one where you would like electronic prescriptions to be sent.  Electronic prescriptions: In compliance with the [place] Strengthen Opioid Misuse Prevention (STOP) Act of 2017 (Session Law 2017-74/H243), effective February 07, 2018, all controlled substances must be electronically prescribed. Calling prescriptions to the pharmacy will cease to exist.  Prescription refills: Only during scheduled appointments. Applies to all prescriptions.  NOTE: The following applies primarily to controlled substances (Opioid* Pain Medications).   Type of encounter (visit): For patients receiving controlled substances, face-to-face visits are required. (Not an option or up to the patient.)  Patient's responsibilities: Pain Pills: Bring all pain pills to every appointment (except for procedure appointments). Pill Bottles: Bring pills in original pharmacy bottle. Always bring the newest bottle. Bring bottle, even if empty. Medication refills: You are responsible for knowing and keeping track of what medications you take and those you need refilled. The day before your appointment: write a list of all prescriptions that need to be refilled. The day of the appointment: give the list to the admitting nurse. Prescriptions will be written only during appointments. No prescriptions will be written on procedure days. If you forget a medication: it will not be "Called in", "Faxed", or "electronically sent". You will  need to get another appointment to get these prescribed. No early refills. Do not call asking to have your prescription filled early. Prescription Accuracy: You are responsible for carefully inspecting your prescriptions before leaving our office. Have the discharge nurse carefully go over each prescription with you, before taking them home. Make sure that your name is accurately spelled, that your address is correct. Check the name and dose of your medication to make sure it is accurate. Check the number of pills, and the written instructions to make sure they are clear and accurate. Make sure that you are given enough medication to last until your next medication refill appointment. Taking Medication: Take medication as prescribed. When it comes to controlled substances, taking less pills or less frequently than prescribed is permitted and encouraged. Never take more pills than instructed. Never take medication more frequently than prescribed.  Inform other Doctors: Always inform, all of your healthcare providers, of all the medications you take. Pain Medication from other Providers: You are not allowed to accept any additional pain medication from any other Doctor or Healthcare provider. There are two exceptions to this rule. (see below) In the event that you require additional pain medication, you are responsible for notifying us, as stated below. Cough Medicine: Often these contain an opioid, such as codeine or hydrocodone. Never accept or take cough medicine containing these opioids if you are already taking an opioid* medication. The combination may cause respiratory failure and death. Medication Agreement: You are responsible for carefully reading and following our Medication Agreement. This must be signed before receiving any prescriptions from our practice. Safely store a copy of your signed Agreement. Violations to the Agreement will result in no further prescriptions. (Additional copies of our  Medication Agreement are available upon request.) Laws, Rules, & Regulations: All patients are expected to follow all Federal and State Laws, Statutes, Rules, & Regulations. Ignorance of   the Laws does not constitute a valid excuse.  Illegal drugs and Controlled Substances: The use of illegal substances (including, but not limited to marijuana and its derivatives) and/or the illegal use of any controlled substances is strictly prohibited. Violation of this rule may result in the immediate and permanent discontinuation of any and all prescriptions being written by our practice. The use of any illegal substances is prohibited. Adopted CDC guidelines & recommendations: Target dosing levels will be at or below 60 MME/day. Use of benzodiazepines** is not recommended.  Exceptions: There are only two exceptions to the rule of not receiving pain medications from other Healthcare Providers. Exception #1 (Emergencies): In the event of an emergency (i.e.: accident requiring emergency care), you are allowed to receive additional pain medication. However, you are responsible for: As soon as you are able, call our office [phone], at any time of the day or night, and leave a message stating your name, the date and nature of the emergency, and the name and dose of the medication prescribed. In the event that your call is answered by a member of our staff, make sure to document and save the date, time, and the name of the person that took your information.  Exception #2 (Planned Surgery): In the event that you are scheduled by another doctor or dentist to have any type of surgery or procedure, you are allowed (for a period no longer than 30 days), to receive additional pain medication, for the acute post-op pain. However, in this case, you are responsible for picking up a copy of our "Post-op Pain Management for Surgeons" handout, and giving it to your surgeon or dentist. This document is available at our office, and  does not require an appointment to obtain it. Simply go to our office during business hours (Monday-Thursday from 8:00 AM to 4:00 PM) (Friday 8:00 AM to 12:00 Noon) or if you have a scheduled appointment with us, prior to your surgery, and ask for it by name. In addition, you are responsible for: calling our office [phone], at any time of the day or night, and leaving a message stating your name, name of your surgeon, type of surgery, and date of procedure or surgery. Failure to comply with your responsibilities may result in termination of therapy involving the controlled substances.  *Opioid medications include: morphine, codeine, oxycodone, oxymorphone, hydrocodone, hydromorphone, meperidine, tramadol, tapentadol, buprenorphine, fentanyl, methadone. **Benzodiazepine medications include: diazepam (Valium), alprazolam (Xanax), clonazepam (Klonopine), lorazepam (Ativan), clorazepate (Tranxene), chlordiazepoxide (Librium), estazolam (Prosom), oxazepam (Serax), temazepam (Restoril), triazolam (Halcion) (Last updated: 01/06/2020) ____________________________________________________________________________________________  ____________________________________________________________________________________________  Medication Recommendations and Reminders  Applies to: All patients receiving prescriptions (written and/or electronic).  Medication Rules & Regulations: These rules and regulations exist for your safety and that of others. They are not flexible and neither are we. Dismissing or ignoring them will be considered "non-compliance" with medication therapy, resulting in complete and irreversible termination of such therapy. (See document titled "Medication Rules" for more details.) In all conscience, because of safety reasons, we cannot continue providing a therapy where the patient does not follow instructions.  Pharmacy of record:  Definition: This is the pharmacy where your electronic  prescriptions will be sent.  We do not endorse any particular pharmacy, however, we have experienced problems with [facility] not securing enough medication supply for the community. We do not restrict you in your choice of pharmacy. However, once we write for your prescriptions, we will NOT be re-sending more prescriptions to fix restricted supply problems   created by your pharmacy, or your insurance.  The pharmacy listed in the electronic medical record should be the one where you want electronic prescriptions to be sent. If you choose to change pharmacy, simply notify our nursing staff.  Recommendations: Keep all of your pain medications in a safe place, under lock and key, even if you live alone. We will NOT replace lost, stolen, or damaged medication. After you fill your prescription, take 1 week's worth of pills and put them away in a safe place. You should keep a separate, properly labeled bottle for this purpose. The remainder should be kept in the original bottle. Use this as your primary supply, until it runs out. Once it's gone, then you know that you have 1 week's worth of medicine, and it is time to come in for a prescription refill. If you do this correctly, it is unlikely that you will ever run out of medicine. To make sure that the above recommendation works, it is very important that you make sure your medication refill appointments are scheduled at least 1 week before you run out of medicine. To do this in an effective manner, make sure that you do not leave the office without scheduling your next medication management appointment. Always ask the nursing staff to show you in your prescription , when your medication will be running out. Then arrange for the receptionist to get you a return appointment, at least 7 days before you run out of medicine. Do not wait until you have 1 or 2 pills left, to come in. This is very poor planning and does not take into consideration that we may need to  cancel appointments due to bad weather, sickness, or emergencies affecting our staff. DO NOT ACCEPT A "Partial Fill": If for any reason your pharmacy does not have enough pills/tablets to completely fill or refill your prescription, do not allow for a "partial fill". The law allows the pharmacy to complete that prescription within 72 hours, without requiring a new prescription. If they do not fill the rest of your prescription within those 72 hours, you will need a separate prescription to fill the remaining amount, which we will NOT provide. If the reason for the partial fill is your insurance, you will need to talk to the pharmacist about payment alternatives for the remaining tablets, but again, DO NOT ACCEPT A PARTIAL FILL, unless you can trust your pharmacist to obtain the remainder of the pills within 72 hours.  Prescription refills and/or changes in medication(s):  Prescription refills, and/or changes in dose or medication, will be conducted only during scheduled medication management appointments. (Applies to both, written and electronic prescriptions.) No refills on procedure days. No medication will be changed or started on procedure days. No changes, adjustments, and/or refills will be conducted on a procedure day. Doing so will interfere with the diagnostic portion of the procedure. No phone refills. No medications will be "called into the pharmacy". No Fax refills. No weekend refills. No Holliday refills. No after hours refills.  Remember:  Business hours are:  Monday to Thursday 8:00 AM to 4:00 PM Provider's Schedule: Grayson Pfefferle, MD - Appointments are:  Medication management: Monday and Wednesday 8:00 AM to 4:00 PM Procedure day: Tuesday and Thursday 7:30 AM to 4:00 PM Bilal Lateef, MD - Appointments are:  Medication management: Tuesday and Thursday 8:00 AM to 4:00 PM Procedure day: Monday and Wednesday 7:30 AM to 4:00 PM (Last update:  08/28/2019) ____________________________________________________________________________________________  ____________________________________________________________________________________________  CBD (cannabidiol) WARNING    Applicable to: All individuals currently taking or considering taking CBD (cannabidiol) and, more important, all patients taking opioid analgesic controlled substances (pain medication). (Example: oxycodone; oxymorphone; hydrocodone; hydromorphone; morphine; methadone; tramadol; tapentadol; fentanyl; buprenorphine; butorphanol; dextromethorphan; meperidine; codeine; etc.)  Legal status: CBD remains a Schedule I drug prohibited for any use. CBD is illegal with one exception. In the [place], CBD has a limited Food and Drug Administration (FDA) approval for the treatment of two specific types of epilepsy disorders. Only one CBD product has been approved by the FDA for this purpose: "Epidiolex". FDA is aware that some companies are marketing products containing cannabis and cannabis-derived compounds in ways that violate the Federal Food, Drug and Cosmetic Act (FD&C Act) and that may put the health and safety of consumers at risk. The FDA, a Federal agency, has not enforced the CBD status since 2018.   Legality: Some manufacturers ship CBD products nationally, which is illegal. Often such products are sold online and are therefore available throughout the country. CBD is openly sold in head shops and health food stores in some states where such sales have not been explicitly legalized. Selling unapproved products with unsubstantiated therapeutic claims is not only a violation of the law, but also can put patients at risk, as these products have not been proven to be safe or effective. Federal illegality makes it difficult to conduct research on CBD.  Reference: "FDA Regulation of Cannabis and Cannabis-Derived Products, Including Cannabidiol (CBD)" -  https://www.fda.gov/news-events/public-health-focus/fda-regulation-cannabis-and-cannabis-derived-products-including-cannabidiol-cbd  Warning: CBD is not FDA approved and has not undergo the same manufacturing controls as prescription drugs.  This means that the purity and safety of available CBD may be questionable. Most of the time, despite manufacturer's claims, it is contaminated with THC (delta-9-tetrahydrocannabinol - the chemical in marijuana responsible for the "HIGH").  When this is the case, the THC contaminant will trigger a positive urine drug screen (UDS) test for Marijuana (carboxy-THC). Because a positive UDS for any illicit substance is a violation of our medication agreement, your opioid analgesics (pain medicine) may be permanently discontinued.  MORE ABOUT CBD  General Information: CBD  is a derivative of the Marijuana (cannabis sativa) plant discovered in 1940. It is one of the 113 identified substances found in Marijuana. It accounts for up to 40% of the plant's extract. As of 2018, preliminary clinical studies on CBD included research for the treatment of anxiety, movement disorders, and pain. CBD is available and consumed in multiple forms, including inhalation of smoke or vapor, as an aerosol spray, and by mouth. It may be supplied as an oil containing CBD, capsules, dried cannabis, or as a liquid solution. CBD is thought not to be as psychoactive as THC (delta-9-tetrahydrocannabinol - the chemical in marijuana responsible for the "HIGH"). Studies suggest that CBD may interact with different biological target receptors in the body, including cannabinoid and other neurotransmitter receptors. As of 2018 the mechanism of action for its biological effects has not been determined.  Side-effects  Adverse reactions: Dry mouth, diarrhea, decreased appetite, fatigue, drowsiness, malaise, weakness, sleep disturbances, and others.  Drug interactions: CBC may interact with other medications  such as blood-thinners. (Last update: 09/14/2019) ____________________________________________________________________________________________  ____________________________________________________________________________________________  Drug Holidays (Slow)  What is a "Drug Holiday"? Drug Holiday: is the name given to the period of time during which a patient stops taking a medication(s) for the purpose of eliminating tolerance to the drug.  Benefits Improved effectiveness of opioids. Decreased opioid dose needed to achieve benefits. Improved pain with lesser dose.    What is tolerance? Tolerance: is the progressive decreased in effectiveness of a drug due to its repetitive use. With repetitive use, the body gets use to the medication and as a consequence, it loses its effectiveness. This is a common problem seen with opioid pain medications. As a result, a larger dose of the drug is needed to achieve the same effect that used to be obtained with a smaller dose.  How long should a "Drug Holiday" last? You should stay off of the pain medicine for at least 14 consecutive days. (2 weeks)  Should I stop the medicine "cold turkey"? No. You should always coordinate with your Pain Specialist so that he/she can provide you with the correct medication dose to make the transition as smoothly as possible.  How do I stop the medicine? Slowly. You will be instructed to decrease the daily amount of pills that you take by one (1) pill every seven (7) days. This is called a "slow downward taper" of your dose. For example: if you normally take four (4) pills per day, you will be asked to drop this dose to three (3) pills per day for seven (7) days, then to two (2) pills per day for seven (7) days, then to one (1) per day for seven (7) days, and at the end of those last seven (7) days, this is when the "Drug Holiday" would start.   Will I have withdrawals? By doing a "slow downward taper" like this one, it  is unlikely that you will experience any significant withdrawal symptoms. Typically, what triggers withdrawals is the sudden stop of a high dose opioid therapy. Withdrawals can usually be avoided by slowly decreasing the dose over a prolonged period of time. If you do not follow these instructions and decide to stop your medication abruptly, withdrawals may be possible.  What are withdrawals? Withdrawals: refers to the wide range of symptoms that occur after stopping or dramatically reducing opiate drugs after heavy and prolonged use. Withdrawal symptoms do not occur to patients that use low dose opioids, or those who take the medication sporadically. Contrary to benzodiazepine (example: Valium, Xanax, etc.) or alcohol withdrawals ("Delirium Tremens"), opioid withdrawals are not lethal. Withdrawals are the physical manifestation of the body getting rid of the excess receptors.  Expected Symptoms Early symptoms of withdrawal may include: Agitation Anxiety Muscle aches Increased tearing Insomnia Runny nose Sweating Yawning  Late symptoms of withdrawal may include: Abdominal cramping Diarrhea Dilated pupils Goose bumps Nausea Vomiting  Will I experience withdrawals? Due to the slow nature of the taper, it is very unlikely that you will experience any.  What is a slow taper? Taper: refers to the gradual decrease in dose.  (Last update: 08/28/2019) ____________________________________________________________________________________________    

## 2020-07-28 ENCOUNTER — Ambulatory Visit
Admission: RE | Admit: 2020-07-28 | Discharge: 2020-07-28 | Disposition: A | Discharge status: Home / Self Care | Payer: No Typology Code available for payment source | Attending: Pain Medicine | Admitting: Pain Medicine

## 2020-07-28 ENCOUNTER — Ambulatory Visit
Admission: RE | Admit: 2020-07-28 | Discharge: 2020-07-28 | Disposition: A | Discharge status: Home / Self Care | Payer: No Typology Code available for payment source | Source: Ambulatory Visit | Attending: Pain Medicine | Admitting: Pain Medicine

## 2020-07-28 DIAGNOSIS — M25561 Pain in right knee: Secondary | ICD-10-CM

## 2020-07-28 DIAGNOSIS — G8929 Other chronic pain: Secondary | ICD-10-CM | POA: Insufficient documentation

## 2020-07-28 IMAGING — CR DG KNEE 1-2V*R*
1 series · 2 of 2 positions shown · non-contrast
Comparison: Knee MRI [DATE]

CLINICAL DATA: Right knee pain. Arthralgia. Medial pain for 1-2
months.

EXAM:
RIGHT KNEE - 1-2 VIEW

[Series 1: dg knee 1-2 views right · 0.14mm/px · 2 of 2 slices shown]
[im 1/2]
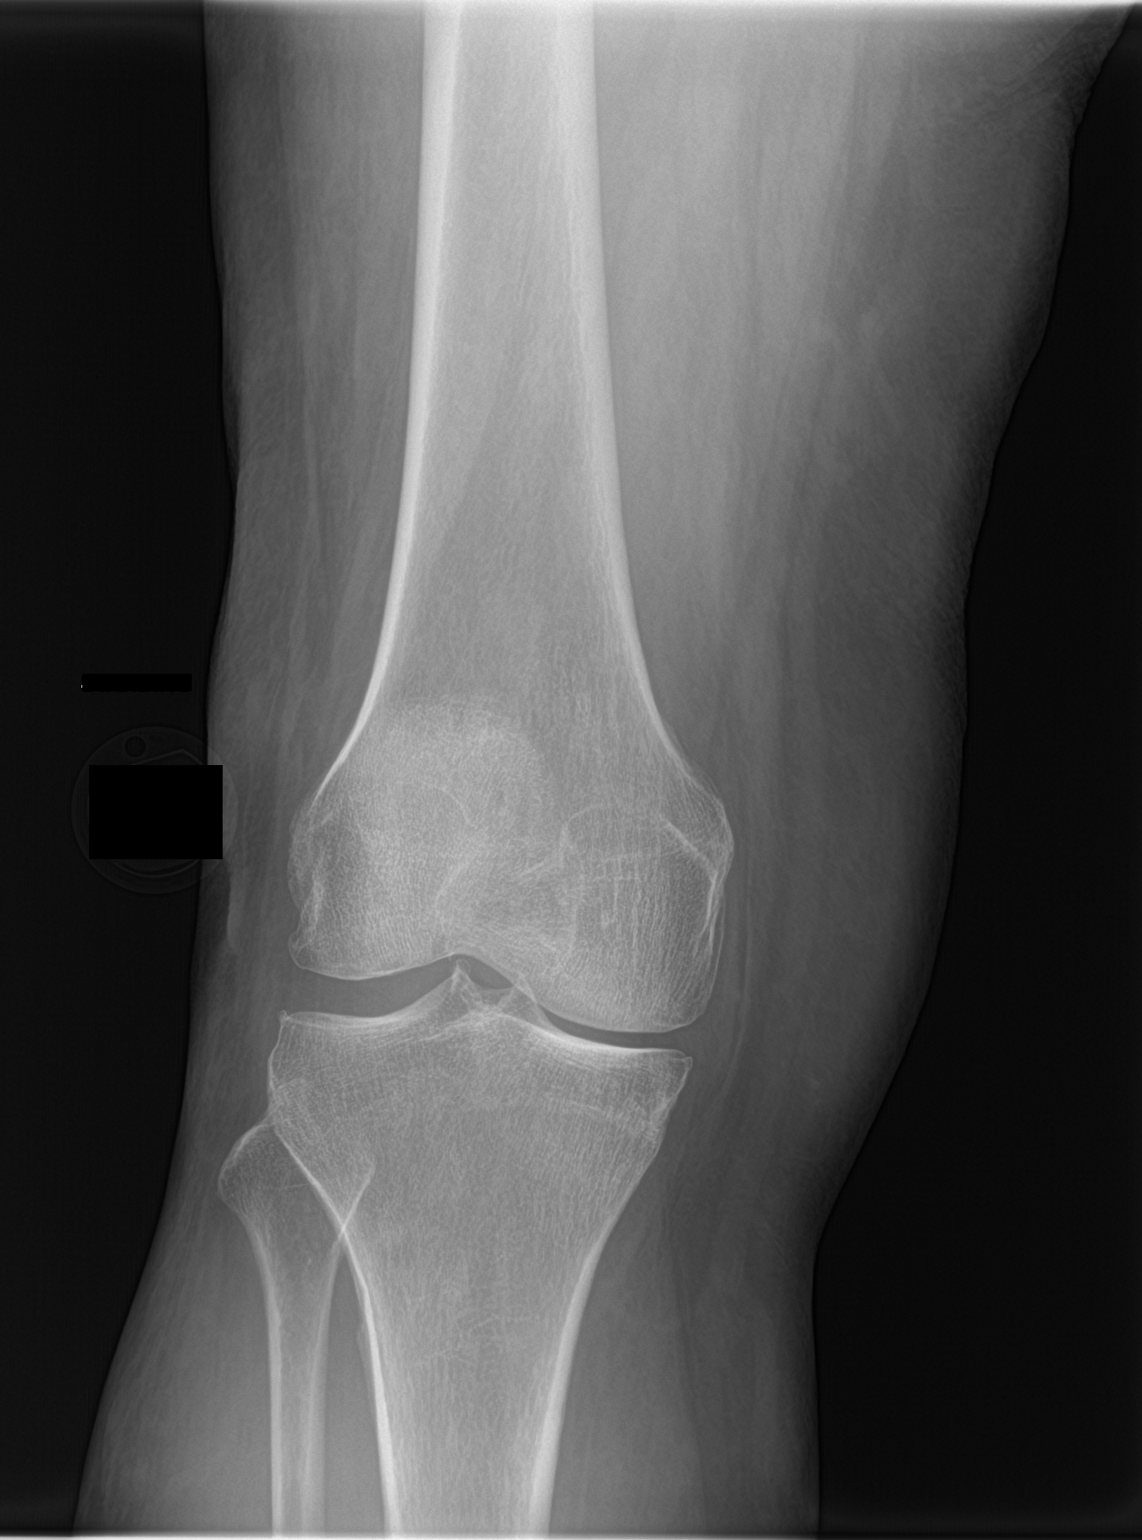
[im 2/2]
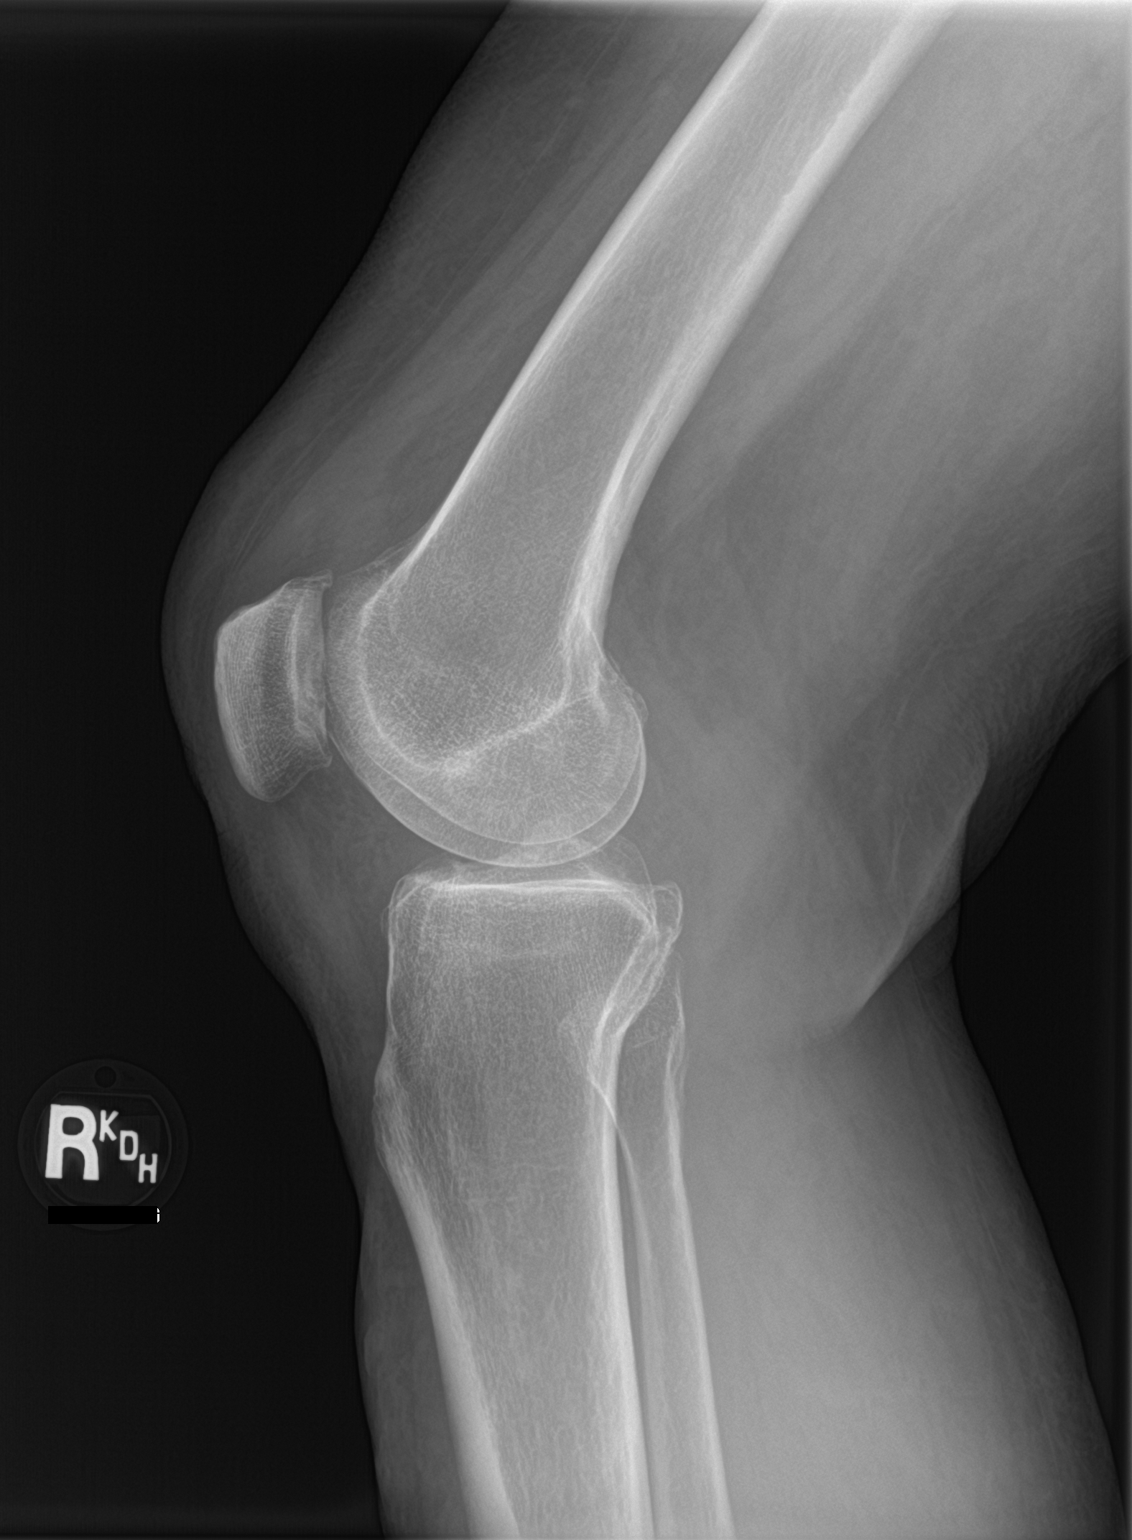

[2 of 2 positions shown; findings below may reference images not displayed]

FINDINGS: Frontal and lateral views obtained standing. Mild medial compartment
joint space narrowing. There is mild tricompartmental peripheral
spurring. Subchondral cysts in the central patella. Mild spurring of
the tibial spines. Trace joint effusion. No fracture, erosion, or
focal bone abnormality.
IMPRESSION: Mild tricompartmental osteoarthritis with trace joint effusion.

## 2020-08-02 LAB — TOXASSURE SELECT 13 (MW), URINE

## 2020-08-17 DIAGNOSIS — M25461 Effusion, right knee: Secondary | ICD-10-CM | POA: Insufficient documentation

## 2020-08-17 DIAGNOSIS — M1711 Unilateral primary osteoarthritis, right knee: Secondary | ICD-10-CM | POA: Insufficient documentation

## 2020-08-17 NOTE — Progress Notes (Signed)
PROVIDER NOTE: Interpretation of information contained herein should be left to medically-trained personnel. Specific patient instructions are provided elsewhere under "Patient Instructions" section of medical record. This document was created in part using STT-dictation technology, any transcriptional errors that may result from this process are unintentional.  Patient: Carolyn Roy Type: Established DOB: October 19, 1965 MRN: [id number] PCP: Carolyn Lick, NP  Service: Procedure DOS: 08/18/2020 Setting: Ambulatory Location: Ambulatory outpatient facility Delivery: Face-to-face Provider: Gaspar Cola, MD Specialty: Interventional Pain Management Specialty designation: 09 Location: Outpatient facility Ref. Prov.: Carolyn Guarneri T, NP   Procedure [facility] Interventional Pain Management )    Procedure: ER-steroid Knee Injection  Laterality: Right (-RT) Level: Intra-articular  No.: n/a Series: n/a Purpose: Therapeutic/Palliative Indications: Knee arthralgia associated to osteoarthritis of the knee   Imaging: None required ([id number]) Analgesia: Skin infiltration w/ local anesthetics Sedation: None  NAS-11 score:   Pre-procedure: 10-Worst pain ever/10   Post-procedure: 10-Worst pain ever/10      1. Osteoarthritis of knee (Right)   2. Chronic pain of knee (Right)   3. Effusion of knee joint (Right)   4. Tricompartment osteoarthritis of knee (Right)    RTCB: 12/01/2020 Nonopioids transfer 02/03/2020: Pregabalin (Lyrica) and alpha-lipoic acid  Pharmacotherapy Assessment  Analgesic: Oxycodone IR 5 mg, 1 tab PO BID (10 mg/day of oxycodone) (Average: 2 tabs/day = 10 mg/day of oxycodone) MME/day: 15 mg/day.  Monitoring: [place] PMP: PDMP reviewed during this encounter.       Pharmacotherapy: No side-effects or adverse reactions reported. Compliance: No problems identified. Effectiveness: Clinically acceptable.  UDS:  Summary  Date Value Ref Range Status  07/27/2020 Note  Final     Comment:    ==================================================================== [employer] Select 13 (MW) ==================================================================== Test                             Result       Flag       Units  Drug Present and Declared for Prescription Verification   Oxycodone                      491          EXPECTED   ng/mg creat   Oxymorphone                    228          EXPECTED   ng/mg creat   Noroxycodone                   1816         EXPECTED   ng/mg creat   Noroxymorphone                 96           EXPECTED   ng/mg creat    Sources of oxycodone are scheduled prescription medications.    Oxymorphone, noroxycodone, and noroxymorphone are expected    metabolites of oxycodone. Oxymorphone is also available as a    scheduled prescription medication.  ==================================================================== Test                      Result    Flag   Units      Ref Range   Creatinine              57  mg/dL      >=20 ==================================================================== Declared Medications:  The flagging and interpretation on this report are based on the  following declared medications.  Unexpected results may arise from  inaccuracies in the declared medications.   **Note: The testing scope of this panel includes these medications:   Oxycodone (Roxicodone)   **Note: The testing scope of this panel does not include the  following reported medications:   Acetaminophen (Excedrin)  Alpha Lipoic Acid (Alpha Lipoic Acid)  Aspirin (Excedrin)  Atorvastatin (Lipitor)  Caffeine (Excedrin)  Dulaglutide (Trulicity)  Duloxetine (Cymbalta)  Empagliflozin (Jardiance)  Goserelin  Insulin (Basaglar)  Pregabalin (Lyrica)  Sertraline (Zoloft)  Tamoxifen (Nolvadex)  Trazodone (Desyrel)  Vitamin D2 (Drisdol) ==================================================================== For clinical consultation, please  call [phone]. ====================================================================      Pre-Procedure Preparation  Monitoring: As per clinic protocol.  Risk Assessment: Vitals:  [id number] body mass index is 30.55 kg/m as calculated from the following:   Height as of this encounter: 5\' 4"  (1.626 m).   Weight as of this encounter: 178 lb (80.7 kg)., Rate:87 , BP:109/76, Resp:16, Temp:(!) 97.3 F (36.3 C), SpO2:   Allergies: She is allergic to aspirin.  Precautions: None required  Blood-thinner(s): None at this time  Coagulopathies: Reviewed. None identified.   Active Infection(s): Reviewed. None identified. [name] is afebrile   Location setting: Exam room Position: Sitting w/ knee bent 90 degrees Safety Precautions: Patient was assessed for positional comfort and pressure points before starting the procedure. Prepping solution: DuraPrep (Iodine Povacrylex [0.7% available iodine] and Isopropyl Alcohol, 74% w/w) Prep Area: Entire knee region Approach: percutaneous, just above the tibial plateau, lateral to the infrapatellar tendon. Intended target: Intra-articular knee space Materials: Tray: Block Needle(s): Regular Qty: 1/side Length: 1.5-inch Gauge: 25G   Meds ordered this encounter  Medications   lidocaine (XYLOCAINE) 2 % (with pres) injection 400 mg   Triamcinolone Acetonide SRER 32 mg    Maintain refrigerated.  Prepared suspension may be stored up to 4 hours at ambient conditions.   lidocaine (PF) (XYLOCAINE) 1 % injection 5 mL   ropivacaine (PF) 2 mg/mL (0.2%) (NAROPIN) injection 5 mL   oxyCODONE (OXY IR/ROXICODONE) 5 MG immediate release tablet    Sig: Take 1 tablet (5 mg total) by mouth 2 (two) times daily as needed for severe pain. Must last 30 days    Dispense:  60 tablet    Refill:  0    Not a duplicate. Do NOT delete! Dispense 1 day early if closed on refill date. Avoid benzodiazepines within 8 hours of opioids. Do not send refill requests.      Orders Placed This Encounter  Procedures   KNEE INJECTION    Local Anesthetic & Steroid injection.    Scheduling Instructions:     Side(s): Right Knee     Sedation: None     Timeframe: Today    Order Specific Question:   Where will this procedure be performed?    Answer:   [facility]   Informed Consent Details: Physician/Practitioner Attestation; Transcribe to consent form and obtain patient signature    Note: Always confirm laterality of pain with Ms. White, before procedure. Transcribe to consent form and obtain patient signature.    Order Specific Question:   Physician/Practitioner attestation of informed consent for procedure/surgical case    Answer:   I, the physician/practitioner, attest that I have discussed with the patient the benefits, risks, side effects, alternatives, likelihood of achieving goals and potential problems during recovery for  the procedure that I have provided informed consent.    Order Specific Question:   Procedure    Answer:   Right-sided intra-articular knee arthrocentesis (aspiration and/or injection)    Order Specific Question:   Physician/Practitioner performing the procedure    Answer:   Gabriela Giannelli A. Dossie Arbour, MD    Order Specific Question:   Indication/Reason    Answer:   Chronic right-sided knee pain secondary to knee arthropathy/arthralgia   Provide equipment / supplies at bedside    "Block Tray" (Disposable  single use) Needle type: SpinalRegular Amount/quantity: 1 Size: Short(1.5-inch) Gauge: 25G    Standing Status:   Standing    Number of Occurrences:   1    Order Specific Question:   Specify    Answer:   Block Tray      Time-out: [id number] I initiated and conducted the "Time-out" before starting the procedure, as per protocol. The patient was asked to participate by confirming the accuracy of the "Time Out" information. Verification of the correct person, site, and procedure were performed and confirmed by me, the nursing staff, and the  patient. "Time-out" conducted as per Joint Commission's Universal Protocol (UP.01.01.01). Procedure checklist: Completed   H&P (Pre-op  Assessment)  [name] is a 55 y.o. (year old), female patient, seen today for interventional treatment. She  has a past surgical history that includes Oophorectomy; Tubal ligation; Portacath placement (Right, 07/14/2017); Axillary lymph node biopsy (Left, 07/14/2017); Breast lumpectomy (Left, 01/12/2018); Breast biopsy (Left); Partial mastectomy with needle localization (Left, 01/12/2018); Sentinel node biopsy (Left, 01/12/2018); and Colonoscopy with propofol (N/A, 12/27/2019). [name] has a current medication list which includes the following prescription(s): aspirin-acetaminophen-caffeine, atorvastatin, trulicity, duloxetine, glucose blood, goserelin, basaglar kwikpen, insulin pen needle, jardiance, lamotrigine, [START ON 09/02/2020] oxycodone, [START ON 10/02/2020] oxycodone, pregabalin, tamoxifen, trazodone, vitamin d (ergocalciferol), alpha-lipoic acid, [START ON 11/01/2020] oxycodone, and sertraline, and the following Facility-Administered Medications: heparin lock flush, lidocaine (pf), and sodium chloride flush. Her primarily concern today is the Knee Pain (right)  She is allergic to aspirin.   Last encounter: My last encounter with her was on 07/27/2020. Pertinent problems: [name] has Chronic fatigue; Cancer of midline of breast, left ([place]); Family history of breast cancer; Chemotherapy-induced neuropathy ([place]); Malignant neoplasm of lower-outer quadrant of left breast of female, estrogen receptor positive ([place]); Chronic feet pain (1ry area of Pain) (Bilateral) (R>L); Neuropathic pain of feet (Bilateral); Chronic knee pain (2ry area of Pain) (Bilateral) (R>L); Chronic hand pain (3ry area of Pain) (Bilateral) (R>L); Chronic pain syndrome; Chronic hip pain (4th area of Pain) (Bilateral) (R>L); Chronic upper extremity pain (3ry area of Pain) (Bilateral) (R>L);  Cancer-related pain; Neuropathic pain; Osteoarthritis of knee (Right); Chronic low back pain (Bilateral (L>R) w/o sciatica; Chronic hip pain (Left); Lumbar facet syndrome (Bilateral) (L>R); Idiopathic scoliosis; Chronic sacroiliac joint pain (Left); Chronic pain of knee (Right); Abnormal MRI, lumbar spine (04/20/2018); Lumbar facet arthropathy; Spondylosis without myelopathy or radiculopathy, lumbosacral region; DDD (degenerative disc disease), lumbosacral; Numbness and tingling of upper extremity (C6/C7 dermatomes) (Right); Cervicalgia; Cervical radiculitis (Right); Effusion of knee joint (Right); and Tricompartment osteoarthritis of knee (Right) on their pertinent problem list. Pain Assessment: Severity of   is reported as a 10-Worst pain ever/10. Location: Knee Right/radiates from knee to top of foot and the back of leg feels like cramping. Onset: More than a month ago. Quality: Constant, Burning, Cramping. Timing: Constant. Modifying factor(s): nothing. Vitals:  height is 5\' 4"  (1.626 m) and weight is 178 lb (80.7 kg). Her temporal  temperature is 97.3 F (36.3 C) (abnormal). Her blood pressure is 109/76 and her pulse is 87. Her respiration is 16.   Reason for encounter: "interventional pain management therapy due pain of at least four (4) weeks in duration, with failure to respond and/or inability to tolerate more conservative care.    Related imaging: Knee-L MR wo contrast:  No results found for this or any previous visit.  Knee-R MR wo contrast:  Results for orders placed during the hospital encounter of 04/03/18  MR KNEE RIGHT WO CONTRAST  Narrative CLINICAL DATA:  Right knee pain all around the joint. Unstable when walking.  EXAM: MRI OF THE RIGHT KNEE WITHOUT CONTRAST  TECHNIQUE: Multiplanar, multisequence MR imaging of the knee was performed. No intravenous contrast was administered.  COMPARISON:  None.  FINDINGS: MENISCI  Medial meniscus:  Intact.  Lateral meniscus:   Intact.  LIGAMENTS  Cruciates:  Intact ACL and PCL.  Collaterals: Medial collateral ligament is intact. Lateral collateral ligament complex is intact.  CARTILAGE  Patellofemoral: Partial-thickness cartilage loss of the patellar apex extending into the medial patellar facet. Partial-thickness cartilage loss of the trochlear groove.  Medial: High-grade partial-thickness cartilage loss of weight-bearing surface of the medial femoral condyle.  Lateral:  Small cartilage fissure of lateral femoral condyle.  Joint:  No joint effusion. Normal Hoffa's fat. No plical thickening.  Popliteal Fossa: Intact popliteus tendon. No significant Baker's cyst.  Extensor Mechanism: Intact quadriceps tendon. Intact patellar tendon. Intact medial patellar retinaculum. Intact lateral patellar retinaculum. Intact MPFL.  Bones: No focal marrow signal abnormality. No fracture or dislocation.  Other: No fluid collection or hematoma.  Muscles are normal.  IMPRESSION: 1. No meniscal or ligamentous injury of the right knee. 2. Tricompartmental cartilage abnormalities as described above.   Electronically Signed By: Kathreen Devoid On: 04/03/2018 13:50  Knee-R DG 1-2 views:  Results for orders placed during the hospital encounter of 07/28/20  DG Knee 1-2 Views Right  Narrative CLINICAL DATA:  Right knee pain. Arthralgia. Medial pain for 1-2 months.  EXAM: RIGHT KNEE - 1-2 VIEW  COMPARISON:  Knee MRI 04/03/2018  FINDINGS: Frontal and lateral views obtained standing. Mild medial compartment joint space narrowing. There is mild tricompartmental peripheral spurring. Subchondral cysts in the central patella. Mild spurring of the tibial spines. Trace joint effusion. No fracture, erosion, or focal bone abnormality.  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   Electronically Signed By: Keith Rake M.D. On: 07/28/2020 23:54  Knee-L DG 1-2 views:  No  results found for this or any previous visit.  Knee-R DG 3 views:  No results found for this or any previous visit.  Knee-L DG 3 views:  No results found for this or any previous visit.  Knee-R DG 4 views:  No results found for this or any previous visit.  Knee-L DG 4 views:  No results found for this or any previous visit.    Site Confirmation: [name] was asked to confirm the procedure and laterality before marking the site.  Consent: Before the procedure and under the influence of no sedative(s), amnesic(s), or anxiolytics, the patient was informed of the treatment options, risks and possible complications. To fulfill our ethical and legal obligations, as recommended by the American Medical Association's Code of Ethics, I have informed the patient of my clinical impression; the nature and purpose of the treatment or procedure; the risks, benefits, and possible complications of the intervention; the alternatives, including doing nothing; the risk(s) and benefit(s)  of the alternative treatment(s) or procedure(s); and the risk(s) and benefit(s) of doing nothing. The patient was provided information about the general risks and possible complications associated with the procedure. These may include, but are not limited to: failure to achieve desired goals, infection, bleeding, organ or nerve damage, allergic reactions, paralysis, and death. In addition, the patient was informed of those risks and complications associated to Spine-related procedures, such as failure to decrease pain; infection (i.e.: Meningitis, epidural or intraspinal abscess); bleeding (i.e.: epidural hematoma, subarachnoid hemorrhage, or any other type of intraspinal or peri-dural bleeding); organ or nerve damage (i.e.: Any type of peripheral nerve, nerve root, or spinal cord injury) with subsequent damage to sensory, motor, and/or autonomic systems, resulting in permanent pain, numbness, and/or weakness of one or several areas  of the body; allergic reactions; (i.e.: anaphylactic reaction); and/or death. Furthermore, the patient was informed of those risks and complications associated with the medications. These include, but are not limited to: allergic reactions (i.e.: anaphylactic or anaphylactoid reaction(s)); adrenal axis suppression; blood sugar elevation that in diabetics may result in ketoacidosis or comma; water retention that in patients with history of congestive heart failure may result in shortness of breath, pulmonary edema, and decompensation with resultant heart failure; weight gain; swelling or edema; medication-induced neural toxicity; particulate matter embolism and blood vessel occlusion with resultant organ, and/or nervous system infarction; and/or aseptic necrosis of one or more joints. Finally, the patient was informed that Medicine is not an exact science; therefore, there is also the possibility of unforeseen or unpredictable risks and/or possible complications that may result in a catastrophic outcome. The patient indicated having understood very clearly. We have given the patient no guarantees and we have made no promises. Enough time was given to the patient to ask questions, all of which were answered to the patient's satisfaction. [name] has indicated that she wanted to continue with the procedure. Attestation: I, the ordering provider, attest that I have discussed with the patient the benefits, risks, side-effects, alternatives, likelihood of achieving goals, and potential problems during recovery for the procedure that I have provided informed consent.  Date  Time: 08/18/2020  9:07 AM   Prophylactic antibiotics  Anti-infectives (From admission, onward)    None      Indication(s): None identified   Description of procedure   Start Time: 0951 hrs  Local Anesthesia: Once the patient was positioned, prepped, and time-out was completed. The target area was identified located. The skin was  marked with an approved surgical skin marker. Once marked, the skin (epidermis, dermis, and hypodermis), and deeper tissues (fat, connective tissue and muscle) were infiltrated with a small amount of a short-acting local anesthetic, loaded on a 10cc syringe with a 25G, 1.5-in  Needle. An appropriate amount of time was allowed for local anesthetics to take effect before proceeding to the next step. Local Anesthetic: Lidocaine 1-2% The unused portion of the local anesthetic was discarded in the proper designated containers. Safety Precautions: Aspiration looking for blood return was conducted prior to all injections. At no point did I inject any substances, as a needle was being advanced. Before injecting, the patient was told to immediately notify me if she was experiencing any new onset of "ringing in the ears, or metallic taste in the mouth". No attempts were made at seeking any paresthesias. Safe injection practices and needle disposal techniques used. Medications properly checked for expiration dates. SDV (single dose vial) medications used. After the completion of the procedure, all disposable equipment  used was discarded in the proper designated medical waste containers.  Technical description: Protocol guidelines were followed. After positioning, the target area was identified and prepped in the usual manner. Skin & deeper tissues infiltrated with local anesthetic. Appropriate amount of time allowed to pass for local anesthetics to take effect. Proper needle placement secured. Once satisfactory needle placement was confirmed, I proceeded to inject the desired solution in slow, incremental fashion, intermittently assessing for discomfort or any signs of abnormal or undesired spread of substance. Once completed, the needle was removed and disposed of, as per hospital protocols. The area was cleaned, making sure to leave some of the prepping solution back to take advantage of its long term bactericidal  properties.  Aspiration:  Negative   Vitals:   08/18/20 0906  BP: 109/76  Pulse: 87  Resp: 16  Temp: (!) 97.3 F (36.3 C)  TempSrc: Temporal  Weight: 178 lb (80.7 kg)  Height: 5\' 4"  (1.626 m)    End Time: 0955 hrs   Imaging guidance  Imaging-assisted Technique: None required. Indication(s): N/A Exposure Time: N/A Contrast: None Fluoroscopic Guidance: N/A Ultrasound Guidance: N/A Interpretation: N/A   Post-op assessment  Post-procedure Vital Signs:  Pulse/HCG Rate: 87  Temp: (!) 97.3 F (36.3 C) Resp: 16 BP: 109/76 SpO2:    EBL: None  Complications: No immediate post-treatment complications observed by team, or reported by patient.  Note: The patient tolerated the entire procedure well. A repeat set of vitals were taken after the procedure and the patient was kept under observation following institutional policy, for this type of procedure. Post-procedural neurological assessment was performed, showing return to baseline, prior to discharge. The patient was provided with post-procedure discharge instructions, including a section on how to identify potential problems. Should any problems arise concerning this procedure, the patient was given instructions to immediately contact us, at any time, without hesitation. In any case, we plan to contact the patient by telephone for a follow-up status report regarding this interventional procedure.  Comments:  No additional relevant information.   Plan of care  Chronic Opioid Analgesic:  Oxycodone IR 5 mg, 1 tab PO BID (10 mg/day of oxycodone) (Average: 2 tabs/day = 10 mg/day of oxycodone) MME/day: 15 mg/day.   Medications administered: We administered lidocaine, Triamcinolone Acetonide, and ropivacaine (PF) 2 mg/mL (0.2%).  Follow-up plan:   Return in about 2 weeks (around 09/01/2020) for procedure day (afternoon VV) (PPE).      Interventional Therapies  Risk  Complexity Considerations:   Estimated body mass index is  30.55 kg/m as calculated from the following:   Height as of this encounter: 5\' 4"  (1.626 m).   Weight as of this encounter: 178 lb (80.7 kg). WNL   Planned  Pending:   Pending further evaluation   Under consideration:   Possible bilateral lumbar facet RFA  Diagnostic lumbar sympathetic block  Diagnostic right genicular NB  Possible right genicular nerve RFA    Completed:   Therapeutic right IA Hyalgan knee inj. x2 (steroids) (12/20/18)  Diagnostic bilateral lumbar facet block x2 (08/16/18)  Therapeutic/palliative right knee Hyalgan inj. x5 (06/25/19)    Therapeutic  Palliative (PRN) options:   Therapeutic right IA knee joint injection #3 (steroids)  Diagnostic bilateral lumbar facet block #3  Therapeutic/palliative right knee Hyalgan inj. S2/N1     Recent Visits Date Type Provider Dept  07/27/20 Office Visit Milinda Pointer, MD [facility]  Showing recent visits within past 90 days and meeting all other requirements Today's Visits  Date Type Provider Dept  08/18/20 Procedure visit Milinda Pointer, MD [facility]  Showing today's visits and meeting all other requirements Future Appointments Date Type Provider Dept  09/01/20 Appointment Milinda Pointer, MD [facility]  Showing future appointments within next 90 days and meeting all other requirements  Disposition: Discharge home  Discharge (Date  Time): 08/18/2020; 1000 hrs.   Primary Care Physician: Carolyn Lick, NP Location: [facility] Note by: Carolyn Cola, MD Date: 08/18/2020; Time: 11:16 AM  DISCLAIMER: Medicine is not an Chief Strategy Officer. It has no guarantees or warranties. The decision to proceed with this intervention was based on the information collected from the patient. Conclusions were drawn from the patient's questionnaire, interview, and examination. Because information was provided in large part by the patient, it cannot be  guaranteed that it has not been purposely or unconsciously manipulated or altered. Every effort has been made to obtain as much accurate, relevant, available data as possible. Always take into account that the treatment will also be dependent on availability of resources and existing treatment guidelines, considered by other Pain Management Specialists as being common knowledge and practice, at the time of the intervention. It is also important to point out that variation in procedural techniques and pharmacological choices are the acceptable norm. For Medico-Legal review purposes, the indications, contraindications, technique, and results of the these procedures should only be evaluated, judged and interpreted by a Board-Certified Interventional Pain Specialist with extensive familiarity and expertise in the same exact procedure and technique.

## 2020-08-18 ENCOUNTER — Other Ambulatory Visit: Payer: Self-pay

## 2020-08-18 ENCOUNTER — Ambulatory Visit: Payer: No Typology Code available for payment source | Attending: Pain Medicine | Admitting: Pain Medicine

## 2020-08-18 ENCOUNTER — Encounter: Payer: Self-pay | Admitting: Pain Medicine

## 2020-08-18 VITALS — BP 109/76 | HR 87 | Temp 97.3°F | Resp 16 | Ht 64.0 in | Wt 178.0 lb

## 2020-08-18 DIAGNOSIS — M79641 Pain in right hand: Secondary | ICD-10-CM | POA: Diagnosis present

## 2020-08-18 DIAGNOSIS — M79642 Pain in left hand: Secondary | ICD-10-CM | POA: Diagnosis present

## 2020-08-18 DIAGNOSIS — M1711 Unilateral primary osteoarthritis, right knee: Secondary | ICD-10-CM

## 2020-08-18 DIAGNOSIS — M25561 Pain in right knee: Secondary | ICD-10-CM | POA: Diagnosis present

## 2020-08-18 DIAGNOSIS — M25562 Pain in left knee: Secondary | ICD-10-CM | POA: Insufficient documentation

## 2020-08-18 DIAGNOSIS — M25552 Pain in left hip: Secondary | ICD-10-CM | POA: Diagnosis present

## 2020-08-18 DIAGNOSIS — M79671 Pain in right foot: Secondary | ICD-10-CM | POA: Diagnosis present

## 2020-08-18 DIAGNOSIS — M79672 Pain in left foot: Secondary | ICD-10-CM | POA: Insufficient documentation

## 2020-08-18 DIAGNOSIS — Z79899 Other long term (current) drug therapy: Secondary | ICD-10-CM | POA: Diagnosis present

## 2020-08-18 DIAGNOSIS — M25551 Pain in right hip: Secondary | ICD-10-CM | POA: Insufficient documentation

## 2020-08-18 DIAGNOSIS — M25461 Effusion, right knee: Secondary | ICD-10-CM | POA: Diagnosis present

## 2020-08-18 DIAGNOSIS — G8929 Other chronic pain: Secondary | ICD-10-CM | POA: Diagnosis present

## 2020-08-18 DIAGNOSIS — G893 Neoplasm related pain (acute) (chronic): Secondary | ICD-10-CM

## 2020-08-18 DIAGNOSIS — Z79891 Long term (current) use of opiate analgesic: Secondary | ICD-10-CM | POA: Diagnosis present

## 2020-08-18 DIAGNOSIS — G894 Chronic pain syndrome: Secondary | ICD-10-CM

## 2020-08-18 MED ORDER — ROPIVACAINE HCL 2 MG/ML IJ SOLN
INTRAMUSCULAR | Status: AC
  Filled 2020-08-18: qty 20

## 2020-08-18 MED ORDER — ROPIVACAINE HCL 2 MG/ML IJ SOLN
5.0000 mL | Freq: Once | INTRAMUSCULAR | Status: AC
  Administered 2020-08-18: 20 mL via INTRA_ARTICULAR

## 2020-08-18 MED ORDER — LIDOCAINE HCL 2 % IJ SOLN
20.0000 mL | Freq: Once | INTRAMUSCULAR | Status: AC
  Administered 2020-08-18: 100 mg

## 2020-08-18 MED ORDER — OXYCODONE HCL 5 MG PO TABS: 5.0000 mg | ORAL_TABLET | Freq: Two times a day (BID) | ORAL | 0 refills | Status: DC | PRN

## 2020-08-18 MED ORDER — LIDOCAINE HCL (PF) 1 % IJ SOLN: 5.0000 mL | Freq: Once | INTRAMUSCULAR | Status: DC

## 2020-08-18 MED ORDER — TRIAMCINOLONE ACETONIDE 32 MG IX SRER
32.0000 mg | Freq: Once | INTRA_ARTICULAR | Status: AC
Start: 2020-08-18 — End: 2020-08-18
  Administered 2020-08-18: 32 mg via INTRA_ARTICULAR
  Filled 2020-08-18: qty 32

## 2020-08-18 MED ORDER — LIDOCAINE HCL (PF) 2 % IJ SOLN
INTRAMUSCULAR | Status: AC
  Filled 2020-08-18: qty 5

## 2020-08-18 NOTE — Patient Instructions (Signed)
____________________________________________________________________________________________  Post-Procedure Discharge Instructions  Instructions: Apply ice:  Purpose: This will minimize any swelling and discomfort after procedure.  When: Day of procedure, as soon as you get home. How: Fill a plastic sandwich bag with crushed ice. Cover it with a small towel and apply to injection site. How long: (15 min on, 15 min off) Apply for 15 minutes then remove x 15 minutes.  Repeat sequence on day of procedure, until you go to bed. Apply heat:  Purpose: To treat any soreness and discomfort from the procedure. When: Starting the next day after the procedure. How: Apply heat to procedure site starting the day following the procedure. How long: May continue to repeat daily, until discomfort goes away. Food intake: Start with clear liquids (like water) and advance to regular food, as tolerated.  Physical activities: Keep activities to a minimum for the first 8 hours after the procedure. After that, then as tolerated. Driving: If you have received any sedation, be responsible and do not drive. You are not allowed to drive for 24 hours after having sedation. Blood thinner: (Applies only to those taking blood thinners) You may restart your blood thinner 6 hours after your procedure. Insulin: (Applies only to Diabetic patients taking insulin) As soon as you can eat, you may resume your normal dosing schedule. Infection prevention: Keep procedure site clean and dry. Shower daily and clean area with soap and water. Post-procedure Pain Diary: Extremely important that this be done correctly and accurately. Recorded information will be used to determine the next step in treatment. For the purpose of accuracy, follow these rules: Evaluate only the area treated. Do not report or include pain from an untreated area. For the purpose of this evaluation, ignore all other areas of pain, except for the treated area. After  your procedure, avoid taking a long nap and attempting to complete the pain diary after you wake up. Instead, set your alarm clock to go off every hour, on the hour, for the initial 8 hours after the procedure. Document the duration of the numbing medicine, and the relief you are getting from it. Do not go to sleep and attempt to complete it later. It will not be accurate. If you received sedation, it is likely that you were given a medication that may cause amnesia. Because of this, completing the diary at a later time may cause the information to be inaccurate. This information is needed to plan your care. Follow-up appointment: Keep your post-procedure follow-up evaluation appointment after the procedure (usually 2 weeks for most procedures, 6 weeks for radiofrequencies). DO NOT FORGET to bring you pain diary with you.   Expect: (What should I expect to see with my procedure?) From numbing medicine (AKA: Local Anesthetics): Numbness or decrease in pain. You may also experience some weakness, which if present, could last for the duration of the local anesthetic. Onset: Full effect within 15 minutes of injected. Duration: It will depend on the type of local anesthetic used. On the average, 1 to 8 hours.  From steroids (Applies only if steroids were used): Decrease in swelling or inflammation. Once inflammation is improved, relief of the pain will follow. Onset of benefits: Depends on the amount of swelling present. The more swelling, the longer it will take for the benefits to be seen. In some cases, up to 10 days. Duration: Steroids will stay in the system x 2 weeks. Duration of benefits will depend on multiple posibilities including persistent irritating factors. Side-effects: If present, they  may typically last 2 weeks (the duration of the steroids). Frequent: Cramps (if they occur, drink Gatorade and take over-the-counter Magnesium 450-500 mg once to twice a day); water retention with temporary  weight gain; increases in blood sugar; decreased immune system response; increased appetite. Occasional: Facial flushing (red, warm cheeks); mood swings; menstrual changes. Uncommon: Long-term decrease or suppression of natural hormones; bone thinning. (These are more common with higher doses or more frequent use. This is why we prefer that our patients avoid having any injection therapies in other practices.)  Very Rare: Severe mood changes; psychosis; aseptic necrosis. From procedure: Some discomfort is to be expected once the numbing medicine wears off. This should be minimal if ice and heat are applied as instructed.  Call if: (When should I call?) You experience numbness and weakness that gets worse with time, as opposed to wearing off. New onset bowel or bladder incontinence. (Applies only to procedures done in the spine)  Emergency Numbers: Durning business hours (Monday - Thursday, 8:00 AM - 4:00 PM) (Friday, 9:00 AM - 12:00 Noon): [phone] After hours: [phone] NOTE: If you are having a problem and are unable connect with, or to talk to a provider, then go to your nearest urgent care or emergency department. If the problem is serious and urgent, please call 911. ____________________________________________________________________________________________  ____________________________________________________________________________________________  Medication Rules  Purpose: To inform patients, and their family members, of our rules and regulations.  Applies to: All patients receiving prescriptions (written or electronic).  Pharmacy of record: Pharmacy where electronic prescriptions will be sent. If written prescriptions are taken to a different pharmacy, please inform the nursing staff. The pharmacy listed in the electronic medical record should be the one where you would like electronic prescriptions to be sent.  Electronic prescriptions: In compliance with the [place] Strengthen Opioid Misuse Prevention (STOP) Act of 2017 (Session Law 2017-74/H243), effective February 07, 2018, all controlled substances must be electronically prescribed. Calling prescriptions to the pharmacy will cease to exist.  Prescription refills: Only during scheduled appointments. Applies to all prescriptions.  NOTE: The following applies primarily to controlled substances (Opioid* Pain Medications).   Type of encounter (visit): For patients receiving controlled substances, face-to-face visits are required. (Not an option or up to the patient.)  Patient's responsibilities: Pain Pills: Bring all pain pills to every appointment (except for procedure appointments). Pill Bottles: Bring pills in original pharmacy bottle. Always bring the newest bottle. Bring bottle, even if empty. Medication refills: You are responsible for knowing and keeping track of what medications you take and those you need refilled. The day before your appointment: write a list of all prescriptions that need to be refilled. The day of the appointment: give the list to the admitting nurse. Prescriptions will be written only during appointments. No prescriptions will be written on procedure days. If you forget a medication: it will not be "Called in", "Faxed", or "electronically sent". You will need to get another appointment to get these prescribed. No early refills. Do not call asking to have your prescription filled early. Prescription Accuracy: You are responsible for carefully inspecting your prescriptions before leaving our office. Have the discharge nurse carefully go over each prescription with you, before taking them home. Make sure that your name is accurately spelled, that your address is correct. Check the name and dose of your medication to make sure it is accurate. Check the number of pills, and the written instructions to make sure they are clear and accurate. Make   sure that you are given enough  medication to last until your next medication refill appointment. Taking Medication: Take medication as prescribed. When it comes to controlled substances, taking less pills or less frequently than prescribed is permitted and encouraged. Never take more pills than instructed. Never take medication more frequently than prescribed.  Inform other Doctors: Always inform, all of your healthcare providers, of all the medications you take. Pain Medication from other Providers: You are not allowed to accept any additional pain medication from any other Doctor or Healthcare provider. There are two exceptions to this rule. (see below) In the event that you require additional pain medication, you are responsible for notifying us, as stated below. Cough Medicine: Often these contain an opioid, such as codeine or hydrocodone. Never accept or take cough medicine containing these opioids if you are already taking an opioid* medication. The combination may cause respiratory failure and death. Medication Agreement: You are responsible for carefully reading and following our Medication Agreement. This must be signed before receiving any prescriptions from our practice. Safely store a copy of your signed Agreement. Violations to the Agreement will result in no further prescriptions. (Additional copies of our Medication Agreement are available upon request.) Laws, Rules, & Regulations: All patients are expected to follow all Federal and State Laws, Statutes, Rules, & Regulations. Ignorance of the Laws does not constitute a valid excuse.  Illegal drugs and Controlled Substances: The use of illegal substances (including, but not limited to marijuana and its derivatives) and/or the illegal use of any controlled substances is strictly prohibited. Violation of this rule may result in the immediate and permanent discontinuation of any and all prescriptions being written by our practice. The use of any illegal substances is  prohibited. Adopted CDC guidelines & recommendations: Target dosing levels will be at or below 60 MME/day. Use of benzodiazepines** is not recommended.  Exceptions: There are only two exceptions to the rule of not receiving pain medications from other Healthcare Providers. Exception #1 (Emergencies): In the event of an emergency (i.e.: accident requiring emergency care), you are allowed to receive additional pain medication. However, you are responsible for: As soon as you are able, call our office [phone], at any time of the day or night, and leave a message stating your name, the date and nature of the emergency, and the name and dose of the medication prescribed. In the event that your call is answered by a member of our staff, make sure to document and save the date, time, and the name of the person that took your information.  Exception #2 (Planned Surgery): In the event that you are scheduled by another doctor or dentist to have any type of surgery or procedure, you are allowed (for a period no longer than 30 days), to receive additional pain medication, for the acute post-op pain. However, in this case, you are responsible for picking up a copy of our "Post-op Pain Management for Surgeons" handout, and giving it to your surgeon or dentist. This document is available at our office, and does not require an appointment to obtain it. Simply go to our office during business hours (Monday-Thursday from 8:00 AM to 4:00 PM) (Friday 8:00 AM to 12:00 Noon) or if you have a scheduled appointment with us, prior to your surgery, and ask for it by name. In addition, you are responsible for: calling our office [phone], at any time of the day or night, and leaving a message stating your name, name of your   surgeon, type of surgery, and date of procedure or surgery. Failure to comply with your responsibilities may result in termination of therapy involving the controlled substances.  *Opioid medications  include: morphine, codeine, oxycodone, oxymorphone, hydrocodone, hydromorphone, meperidine, tramadol, tapentadol, buprenorphine, fentanyl, methadone. **Benzodiazepine medications include: diazepam (Valium), alprazolam (Xanax), clonazepam (Klonopine), lorazepam (Ativan), clorazepate (Tranxene), chlordiazepoxide (Librium), estazolam (Prosom), oxazepam (Serax), temazepam (Restoril), triazolam (Halcion) (Last updated: 01/06/2020) ____________________________________________________________________________________________  ____________________________________________________________________________________________  Medication Recommendations and Reminders  Applies to: All patients receiving prescriptions (written and/or electronic).  Medication Rules & Regulations: These rules and regulations exist for your safety and that of others. They are not flexible and neither are we. Dismissing or ignoring them will be considered "non-compliance" with medication therapy, resulting in complete and irreversible termination of such therapy. (See document titled "Medication Rules" for more details.) In all conscience, because of safety reasons, we cannot continue providing a therapy where the patient does not follow instructions.  Pharmacy of record:  Definition: This is the pharmacy where your electronic prescriptions will be sent.  We do not endorse any particular pharmacy, however, we have experienced problems with [facility] not securing enough medication supply for the community. We do not restrict you in your choice of pharmacy. However, once we write for your prescriptions, we will NOT be re-sending more prescriptions to fix restricted supply problems created by your pharmacy, or your insurance.  The pharmacy listed in the electronic medical record should be the one where you want electronic prescriptions to be sent. If you choose to change pharmacy, simply notify our nursing staff.  Recommendations: Keep all  of your pain medications in a safe place, under lock and key, even if you live alone. We will NOT replace lost, stolen, or damaged medication. After you fill your prescription, take 1 week's worth of pills and put them away in a safe place. You should keep a separate, properly labeled bottle for this purpose. The remainder should be kept in the original bottle. Use this as your primary supply, until it runs out. Once it's gone, then you know that you have 1 week's worth of medicine, and it is time to come in for a prescription refill. If you do this correctly, it is unlikely that you will ever run out of medicine. To make sure that the above recommendation works, it is very important that you make sure your medication refill appointments are scheduled at least 1 week before you run out of medicine. To do this in an effective manner, make sure that you do not leave the office without scheduling your next medication management appointment. Always ask the nursing staff to show you in your prescription , when your medication will be running out. Then arrange for the receptionist to get you a return appointment, at least 7 days before you run out of medicine. Do not wait until you have 1 or 2 pills left, to come in. This is very poor planning and does not take into consideration that we may need to cancel appointments due to bad weather, sickness, or emergencies affecting our staff. DO NOT ACCEPT A "Partial Fill": If for any reason your pharmacy does not have enough pills/tablets to completely fill or refill your prescription, do not allow for a "partial fill". The law allows the pharmacy to complete that prescription within 72 hours, without requiring a new prescription. If they do not fill the rest of your prescription within those 72 hours, you will need a separate prescription to fill the remaining amount, which  we will NOT provide. If the reason for the partial fill is your insurance, you will need to talk to the  pharmacist about payment alternatives for the remaining tablets, but again, DO NOT ACCEPT A PARTIAL FILL, unless you can trust your pharmacist to obtain the remainder of the pills within 72 hours.  Prescription refills and/or changes in medication(s):  Prescription refills, and/or changes in dose or medication, will be conducted only during scheduled medication management appointments. (Applies to both, written and electronic prescriptions.) No refills on procedure days. No medication will be changed or started on procedure days. No changes, adjustments, and/or refills will be conducted on a procedure day. Doing so will interfere with the diagnostic portion of the procedure. No phone refills. No medications will be "called into the pharmacy". No Fax refills. No weekend refills. No Holliday refills. No after hours refills.  Remember:  Business hours are:  Monday to Thursday 8:00 AM to 4:00 PM Provider's Schedule: Milinda Pointer, MD - Appointments are:  Medication management: Monday and Wednesday 8:00 AM to 4:00 PM Procedure day: Tuesday and Thursday 7:30 AM to 4:00 PM Gillis Santa, MD - Appointments are:  Medication management: Tuesday and Thursday 8:00 AM to 4:00 PM Procedure day: Monday and Wednesday 7:30 AM to 4:00 PM (Last update: 08/28/2019) ____________________________________________________________________________________________  ____________________________________________________________________________________________  CBD (cannabidiol) WARNING  Applicable to: All individuals currently taking or considering taking CBD (cannabidiol) and, more important, all patients taking opioid analgesic controlled substances (pain medication). (Example: oxycodone; oxymorphone; hydrocodone; hydromorphone; morphine; methadone; tramadol; tapentadol; fentanyl; buprenorphine; butorphanol; dextromethorphan; meperidine; codeine; etc.)  Legal status: CBD remains a Schedule I drug prohibited for  any use. CBD is illegal with one exception. In the [place], CBD has a limited Transport planner (FDA) approval for the treatment of two specific types of epilepsy disorders. Only one CBD product has been approved by the FDA for this purpose: "Epidiolex". FDA is aware that some companies are marketing products containing cannabis and cannabis-derived compounds in ways that violate the [employer], Drug and Cosmetic Act [facility] Act) and that may put the health and safety of consumers at risk. The FDA, a Federal agency, has not enforced the CBD status since 2018.   Legality: Some manufacturers ship CBD products nationally, which is illegal. Often such products are sold online and are therefore available throughout the country. CBD is openly sold in head shops and health food stores in some states where such sales have not been explicitly legalized. Selling unapproved products with unsubstantiated therapeutic claims is not only a violation of the law, but also can put patients at risk, as these products have not been proven to be safe or effective. Federal illegality makes it difficult to conduct research on CBD.  Reference: "FDA Regulation of Cannabis and Cannabis-Derived Products, Including Cannabidiol (CBD)" - [employer]  Warning: CBD is not FDA approved and has not undergo the same manufacturing controls as prescription drugs.  This means that the purity and safety of available CBD may be questionable. Most of the time, despite manufacturer's claims, it is contaminated with THC (delta-9-tetrahydrocannabinol - the chemical in marijuana responsible for the "HIGH").  When this is the case, the [facility] contaminant will trigger a positive urine drug screen (UDS) test for Marijuana (carboxy-THC). Because a positive UDS for any illicit substance is a violation of our medication  agreement, your opioid analgesics (pain medicine) may be permanently discontinued.  MORE ABOUT CBD  General Information: CBD  is a derivative of the Marijuana (cannabis  sativa) plant discovered in 59. It is one of the 113 identified substances found in Marijuana. It accounts for up to 40% of the plant's extract. As of 2018, preliminary clinical studies on CBD included research for the treatment of anxiety, movement disorders, and pain. CBD is available and consumed in multiple forms, including inhalation of smoke or vapor, as an aerosol spray, and by mouth. It may be supplied as an oil containing CBD, capsules, dried cannabis, or as a liquid solution. CBD is thought not to be as psychoactive as THC (delta-9-tetrahydrocannabinol - the chemical in marijuana responsible for the "HIGH"). Studies suggest that CBD may interact with different biological target receptors in the body, including cannabinoid and other neurotransmitter receptors. As of 2018 the mechanism of action for its biological effects has not been determined.  Side-effects  Adverse reactions: Dry mouth, diarrhea, decreased appetite, fatigue, drowsiness, malaise, weakness, sleep disturbances, and others.  Drug interactions: CBC may interact with other medications such as blood-thinners. (Last update: 09/14/2019) ____________________________________________________________________________________________

## 2020-08-19 ENCOUNTER — Telehealth: Payer: Self-pay | Admitting: *Deleted

## 2020-08-19 NOTE — Telephone Encounter (Signed)
Attempted to call for post procedure follow-up. Message left. 

## 2020-08-31 NOTE — Progress Notes (Signed)
Patient: Carolyn Roy  Service Category: E/M  Provider: Gaspar Cola, MD  DOB: Feb 10, 1965  DOS: 09/01/2020  Location: Office  MRN: [id number]  Setting: Ambulatory outpatient  Referring Provider: [name], NP  Type: Established Patient  Specialty: Interventional Pain Management  PCP: Carolyn Lick, NP  Location: Remote location  Delivery: TeleHealth     Virtual Encounter - Pain Management PROVIDER NOTE: Information contained herein reflects review and annotations entered in association with encounter. Interpretation of such information and data should be left to medically-trained personnel. Information provided to patient can be located elsewhere in the medical record under "Patient Instructions". Document created using STT-dictation technology, any transcriptional errors that may result from process are unintentional.    Contact & Pharmacy Preferred: [phone] Home: [phone] (home) Mobile: [phone] (mobile) E-mail: [email or web address]  [id number] [place] [facility] [address] [place] [place]Phone: [phone]: [phone]  Pre-screening  Carolyn Roy offered "in-person" vs "virtual" encounter. She indicated preferring virtual for this encounter.   Reason COVID-19*  Social distancing based on CDC and AMA recommendations.   I contacted Carolyn Roy 09/01/2020 via telephone.      I clearly identified myself as FGaspar Cola MD. I verified that I was speaking with the correct person using two identifiers (Name: Carolyn Roy and date of birth: 6May 30, 1967.  Consent I sought verbal advanced consent from Carolyn Emeraldfor virtual visit interactions. I informed Carolyn Roy of possible security and privacy concerns, risks, and limitations associated with providing "not-in-person" medical evaluation and management services. I also informed Carolyn Roy of the availability of "in-person" appointments. Finally, I informed  her that there would be a charge for the virtual visit and that she could be  personally, fully or partially, financially responsible for it. Ms. WDema Severinexpressed understanding and agreed to proceed.   Historic Elements   Ms. Carolyn MCGATHis a 55y.o. year old, female patient evaluated today after our last contact on 08/18/2020. Carolyn Roy  has a past medical history of Allergy, Breast cancer ([place], [place]), Depression, Diabetes mellitus without complication ([place] ([address], Edema of left upper extremity, Endometriosis, Family history of breast cancer, Headache, Hyperlipidemia, Lymphedema of left arm, Ovarian mass, Personal history of chemotherapy, Personal history of radiation therapy, and Pneumonia. She also  has a past surgical history that includes Oophorectomy; Tubal ligation; Portacath placement (Right, 07/14/2017); Axillary lymph node biopsy (Left, 07/14/2017); Breast lumpectomy (Left, 01/12/2018); Breast biopsy (Left); Partial mastectomy with needle localization (Left, 01/12/2018); Sentinel node biopsy (Left, 01/12/2018); and Colonoscopy with propofol (N/A, 12/27/2019). Ms. WDema Severinhas a current medication list which includes the following prescription(s): alpha-lipoic acid, aspirin-acetaminophen-caffeine, atorvastatin, trulicity, duloxetine, glucose blood, goserelin, basaglar kwikpen, insulin pen needle, jardiance, lamotrigine, [START ON 09/02/2020] oxycodone, [START ON 10/02/2020] oxycodone, [START ON 11/01/2020] oxycodone, pregabalin, sertraline, tamoxifen, trazodone, and vitamin d (ergocalciferol), and the following Facility-Administered Medications: heparin lock flush and sodium chloride flush. She  reports that she has been smoking cigarettes. She has a 11.00 pack-year smoking history. She has quit using smokeless tobacco.  Her smokeless tobacco use included snuff. She reports that she does not drink alcohol and does not use drugs. Ms. WDema Severinis allergic to aspirin.   HPI  Today, she is  being contacted for a post-procedure assessment.  Post-Procedure Evaluation  Procedure (08/18/2020):  Procedure: ER-steroid Knee Injection Laterality: Right (-RT) Level: Intra-articular No.: n/a Series: n/a Purpose: Therapeutic/Palliative Indications: Knee arthralgia associated  to osteoarthritis of the knee    Imaging: None required (CPT-20610) Analgesia: Skin infiltration w/ local anesthetics Sedation: None   Pre-procedure pain level: 10/10 Post-procedure: 10/10 No relief  Anxiolysis: none.  Effectiveness during initial hour after procedure (Ultra-Short Term Relief):   100%.  Local anesthetic used: Long-acting (4-6 hours) Effectiveness: Defined as any analgesic benefit obtained secondary to the administration of local anesthetics. This carries significant diagnostic value as to the etiological location, or anatomical origin, of the pain. Duration of benefit is expected to coincide with the duration of the local anesthetic used.  Effectiveness during initial 4-6 hours after procedure (Short-Term Relief):   100%.  Long-term benefit: Defined as any relief past the pharmacologic duration of the local anesthetics.  Effectiveness past the initial 6 hours after procedure (Long-Term Relief):   100% x 24 hours.  Benefits, current: Defined as benefit present at the time of this evaluation.   Analgesia:   The patient indicates currently enjoying an ongoing 80 to 85% relief of her right knee pain. Function: Carolyn Roy reports improvement in function ROM: Carolyn Roy reports improvement in ROM  Pharmacotherapy Assessment   Analgesic: Oxycodone IR 5 mg, 1 tab PO BID (10 mg/day of oxycodone) (Average: 2 tabs/day = 10 mg/day of oxycodone) MME/day: 15 mg/day.   Monitoring: [place] PMP: PDMP reviewed during this encounter.       Pharmacotherapy: No side-effects or adverse reactions reported. Compliance: No problems identified. Effectiveness: Clinically acceptable. Plan: Refer to "POC". UDS:   Summary  Date Value Ref Range Status  07/27/2020 Note  Final    Comment:    ==================================================================== [employer] Select 13 (MW) ==================================================================== Test                             Result       Flag       Units  Drug Present and Declared for Prescription Verification   Oxycodone                      491          EXPECTED   ng/mg creat   Oxymorphone                    228          EXPECTED   ng/mg creat   Noroxycodone                   1816         EXPECTED   ng/mg creat   Noroxymorphone                 96           EXPECTED   ng/mg creat    Sources of oxycodone are scheduled prescription medications.    Oxymorphone, noroxycodone, and noroxymorphone are expected    metabolites of oxycodone. Oxymorphone is also available as a    scheduled prescription medication.  ==================================================================== Test                      Result    Flag   Units      Ref Range   Creatinine              57               mg/dL      >=20 ==================================================================== Declared Medications:  The flagging  and interpretation on this report are based on the  following declared medications.  Unexpected results may arise from  inaccuracies in the declared medications.   **Note: The testing scope of this panel includes these medications:   Oxycodone (Roxicodone)   **Note: The testing scope of this panel does not include the  following reported medications:   Acetaminophen (Excedrin)  Alpha Lipoic Acid (Alpha Lipoic Acid)  Aspirin (Excedrin)  Atorvastatin (Lipitor)  Caffeine (Excedrin)  Dulaglutide (Trulicity)  Duloxetine (Cymbalta)  Empagliflozin (Jardiance)  Goserelin  Insulin (Basaglar)  Pregabalin (Lyrica)  Sertraline (Zoloft)  Tamoxifen (Nolvadex)  Trazodone (Desyrel)  Vitamin D2  (Drisdol) ==================================================================== For clinical consultation, please call [phone]. ====================================================================      Laboratory Chemistry Profile   Renal Lab Results  Component Value Date   BUN 14 07/20/2020   CREATININE 0.63 07/20/2020   BCR 19 10/18/2019   GFRAA >60 10/21/2019   GFRNONAA >60 07/20/2020    Hepatic Lab Results  Component Value Date   AST 15 07/20/2020   ALT 16 07/20/2020   ALBUMIN 3.7 07/20/2020   ALKPHOS 72 07/20/2020    Electrolytes Lab Results  Component Value Date   NA 136 07/20/2020   K 3.7 07/20/2020   CL 101 07/20/2020   CALCIUM 8.7 (L) 07/20/2020   MG 1.9 11/23/2017    Bone Lab Results  Component Value Date   VD25OH 20.0 (L) 12/13/2017    Inflammation (CRP: Acute Phase) (ESR: Chronic Phase) Lab Results  Component Value Date   CRP 7 01/22/2018   ESRSEDRATE 27 01/22/2018         Note: Above Lab results reviewed.  Imaging  DG Knee 1-2 Views Right CLINICAL DATA:  Right knee pain. Arthralgia. Medial pain for 1-2 months.  EXAM: RIGHT KNEE - 1-2 VIEW  COMPARISON:  Knee MRI 04/03/2018  FINDINGS: Frontal and lateral views obtained standing. Mild medial compartment joint space narrowing. There is mild tricompartmental peripheral spurring. Subchondral cysts in the central patella. Mild spurring of the tibial spines. Trace joint effusion. No fracture, erosion, or focal bone abnormality.  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  Electronically Signed   By: Keith Rake M.D.   On: 07/28/2020 23:54  Assessment  There were no encounter diagnoses.  Plan of Care  Problem-specific:  No problem-specific Assessment & Plan notes found for this encounter.  Carolyn Roy has a current medication list which includes the following long-term medication(s): alpha-lipoic acid, atorvastatin, basaglar  kwikpen, lamotrigine, [START ON 09/02/2020] oxycodone, [START ON 10/02/2020] oxycodone, [START ON 11/01/2020] oxycodone, pregabalin, and sertraline.  Pharmacotherapy (Medications Ordered): No orders of the defined types were placed in this encounter.  Orders:  No orders of the defined types were placed in this encounter.  Follow-up plan:   Return for scheduled encounter.     Interventional Therapies  Risk  Complexity Considerations:   Estimated body mass index is 30.55 kg/m as calculated from the following:   Height as of this encounter: '5\' 4"'  (1.626 m).   Weight as of this encounter: 178 lb (80.7 kg). WNL   Planned  Pending:   Pending further evaluation   Under consideration:   Possible bilateral lumbar facet RFA  Diagnostic lumbar sympathetic block  Diagnostic right genicular NB  Possible right genicular nerve RFA    Completed:   Therapeutic right IA Hyalgan knee inj. x2 (steroids) (12/20/18)  Diagnostic bilateral lumbar facet block x2 (08/16/18)  Therapeutic/palliative right knee Hyalgan inj. x5 (06/25/19)  Therapeutic  Palliative (PRN) options:   Therapeutic right IA knee joint injection #3 (steroids)  Diagnostic bilateral lumbar facet block #3  Therapeutic/palliative right knee Hyalgan inj. S2/N1      Recent Visits Date Type Provider Dept  08/18/20 Procedure visit Milinda Pointer, MD [facility]  07/27/20 Office Visit Milinda Pointer, MD [facility]  Showing recent visits within past 90 days and meeting all other requirements Today's Visits Date Type Provider Dept  09/01/20 Telemedicine Milinda Pointer, MD [facility]  Showing today's visits and meeting all other requirements Future Appointments Date Type Provider Dept  11/18/20 Appointment Milinda Pointer, MD [facility]  Showing future appointments within next 90 days and meeting all other requirements I discussed the assessment and treatment plan with the  patient. The patient was provided an opportunity to ask questions and all were answered. The patient agreed with the plan and demonstrated an understanding of the instructions.  Patient advised to call back or seek an in-person evaluation if the symptoms or condition worsens.  Duration of encounter: 14 minutes.  Note by: Carolyn Cola, MD Date: 09/01/2020; Time: 4:40 PM

## 2020-09-01 ENCOUNTER — Ambulatory Visit: Payer: No Typology Code available for payment source | Attending: Pain Medicine | Admitting: Pain Medicine

## 2020-09-01 ENCOUNTER — Telehealth: Payer: Self-pay

## 2020-09-01 ENCOUNTER — Other Ambulatory Visit: Payer: Self-pay

## 2020-09-01 DIAGNOSIS — M25461 Effusion, right knee: Secondary | ICD-10-CM | POA: Diagnosis not present

## 2020-09-01 DIAGNOSIS — M25551 Pain in right hip: Secondary | ICD-10-CM | POA: Diagnosis not present

## 2020-09-01 DIAGNOSIS — M1711 Unilateral primary osteoarthritis, right knee: Secondary | ICD-10-CM

## 2020-09-01 DIAGNOSIS — M25561 Pain in right knee: Secondary | ICD-10-CM

## 2020-09-01 DIAGNOSIS — M25552 Pain in left hip: Secondary | ICD-10-CM

## 2020-09-01 DIAGNOSIS — M25562 Pain in left knee: Secondary | ICD-10-CM

## 2020-09-01 DIAGNOSIS — G8929 Other chronic pain: Secondary | ICD-10-CM

## 2020-09-01 NOTE — Telephone Encounter (Signed)
LM for patient to call office for pre virtual appointment questions.  

## 2020-09-02 NOTE — Progress Notes (Signed)
Called patient and verified her October appt.

## 2020-09-09 ENCOUNTER — Other Ambulatory Visit: Payer: Self-pay

## 2020-09-09 ENCOUNTER — Ambulatory Visit
Admission: RE | Admit: 2020-09-09 | Discharge: 2020-09-09 | Disposition: A | Discharge status: Home / Self Care | Payer: No Typology Code available for payment source | Source: Ambulatory Visit | Attending: Internal Medicine | Admitting: Internal Medicine

## 2020-09-09 DIAGNOSIS — Z1231 Encounter for screening mammogram for malignant neoplasm of breast: Secondary | ICD-10-CM | POA: Diagnosis not present

## 2020-09-09 DIAGNOSIS — C50512 Malignant neoplasm of lower-outer quadrant of left female breast: Secondary | ICD-10-CM | POA: Diagnosis present

## 2020-09-09 DIAGNOSIS — Z170 Estrogen receptor positive status [ER+]: Secondary | ICD-10-CM | POA: Insufficient documentation

## 2020-09-09 IMAGING — MG MM DIGITAL SCREENING BILAT W/ TOMO AND CAD
8 series · 8 of 24 positions shown · non-contrast
Comparison: Previous exam(s).

CLINICAL DATA: Screening.

EXAM:
DIGITAL SCREENING BILATERAL MAMMOGRAM WITH TOMOSYNTHESIS AND CAD
TECHNIQUE: Bilateral screening digital craniocaudal and mediolateral oblique
mammograms were obtained. Bilateral screening digital breast
tomosynthesis was performed. The images were evaluated with
computer-aided detection.

[R MLO synth-2D]
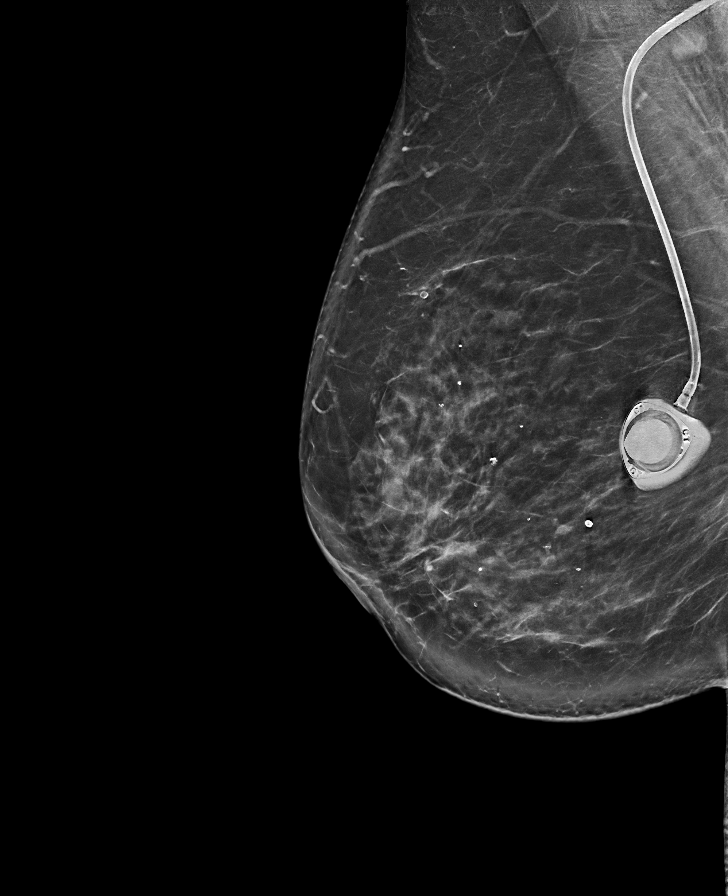

[L CC synth-2D]
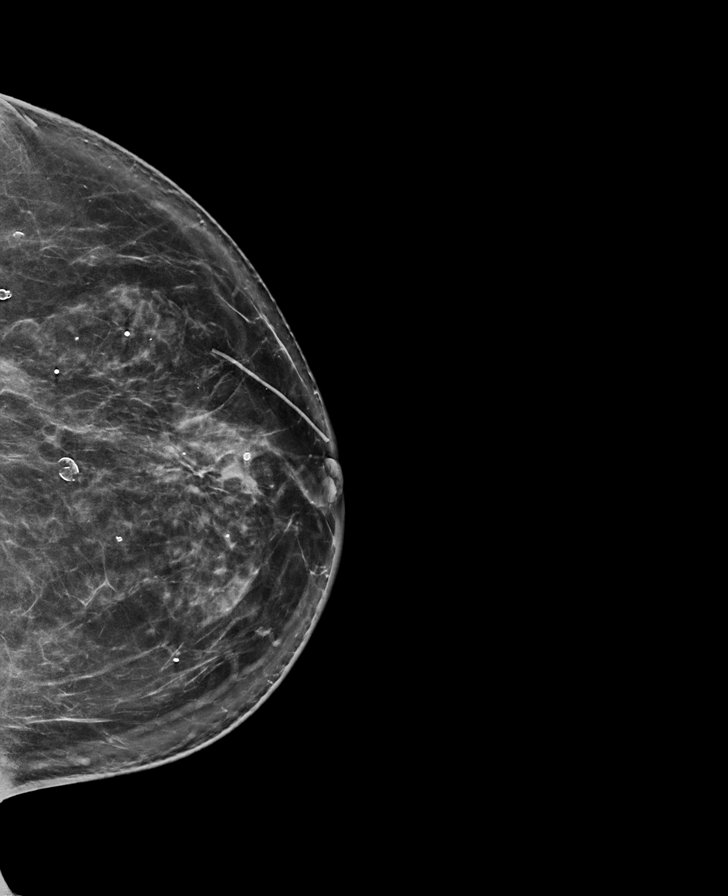

[R CC synth-2D]
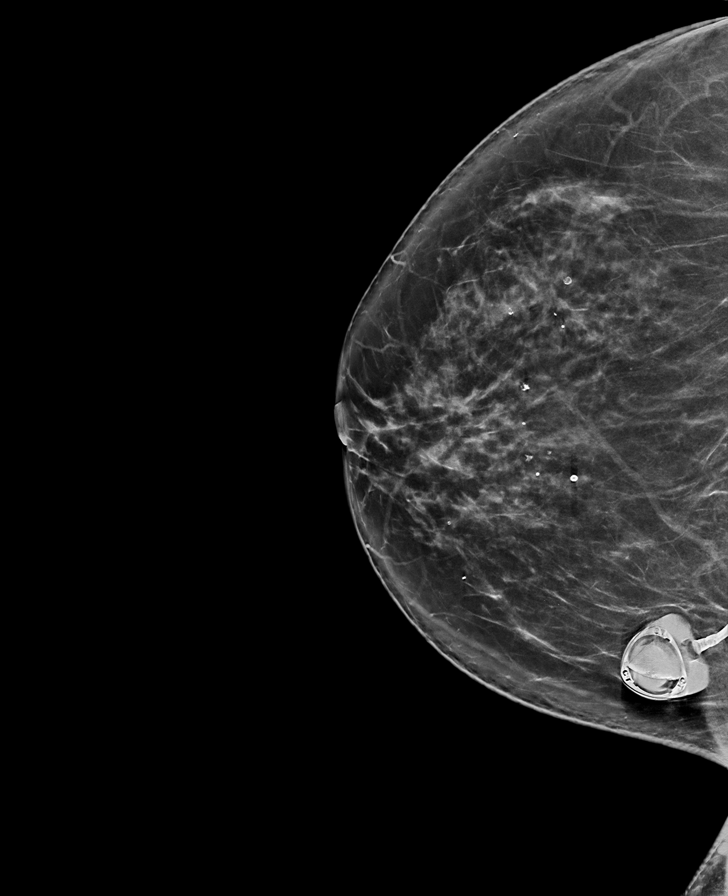

[L MLO synth-2D]
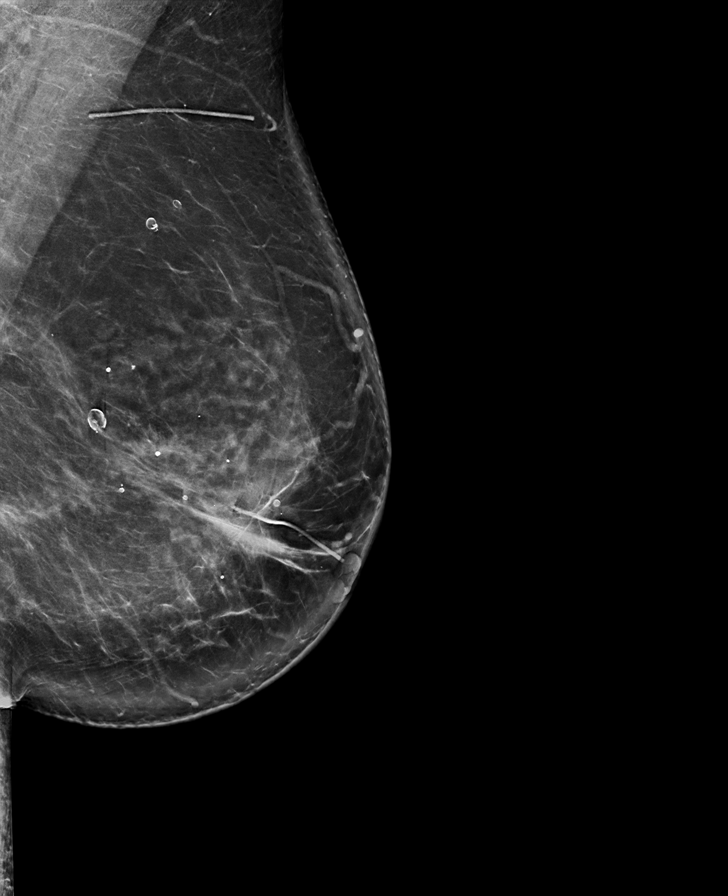

[L CC tomo · tomo slice 45/90.0]
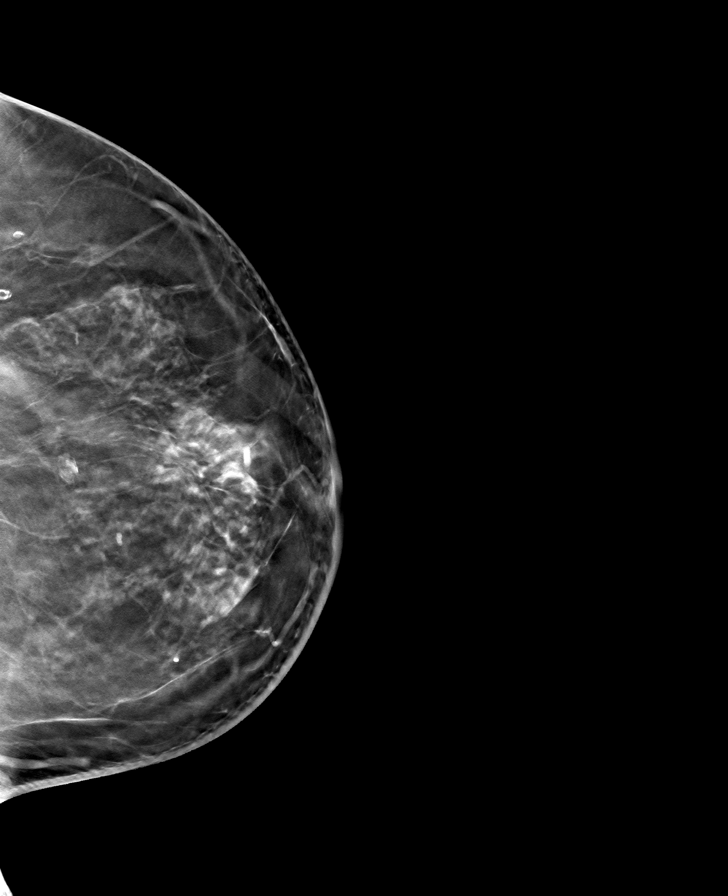

[L MLO tomo · tomo slice 53/104.0]
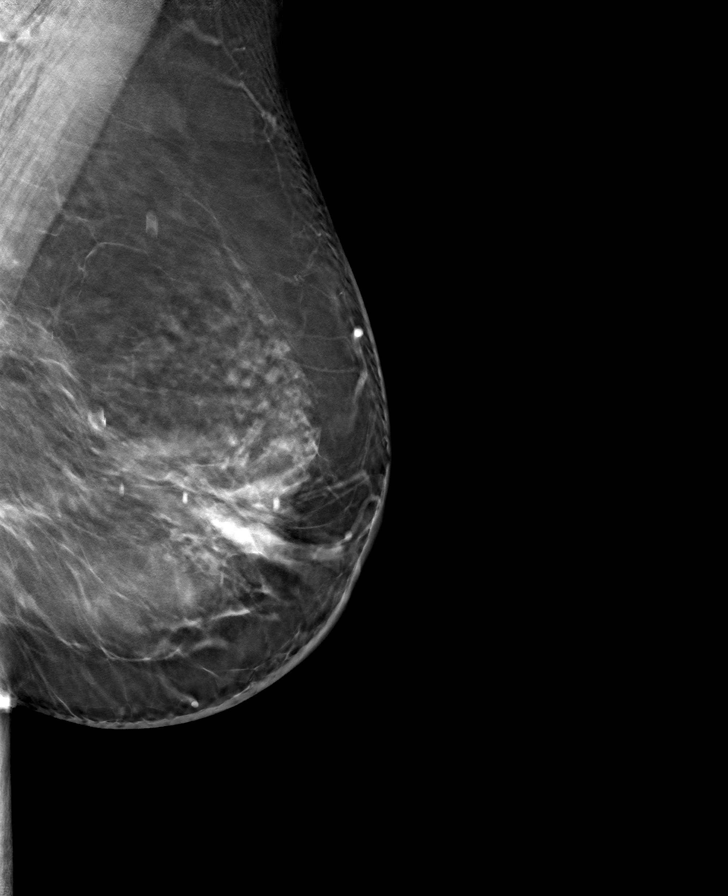

[R CC tomo · tomo slice 41/80.0]
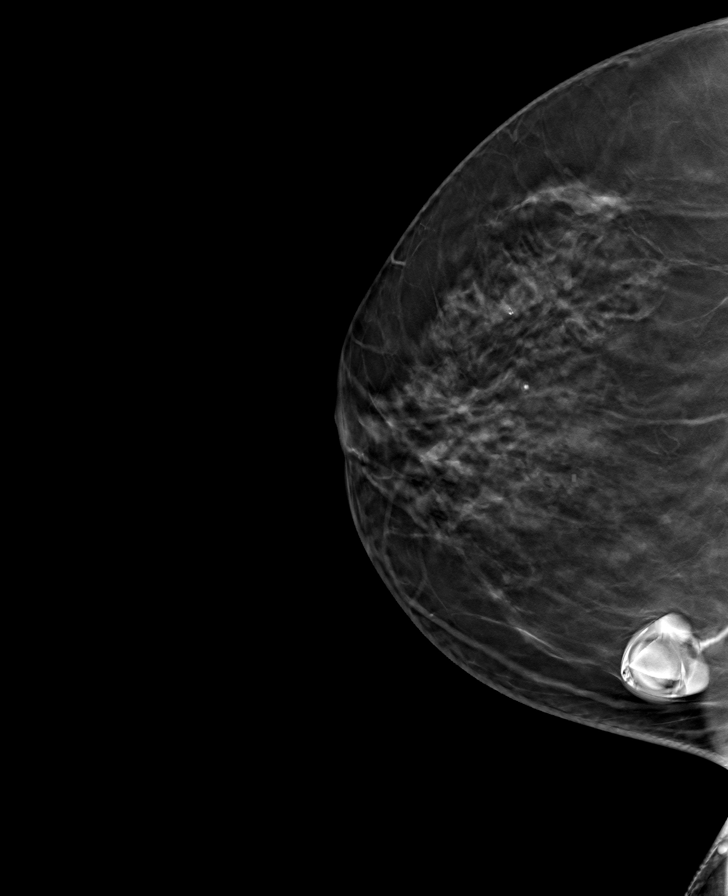

[R MLO tomo · tomo slice 51/100.0]
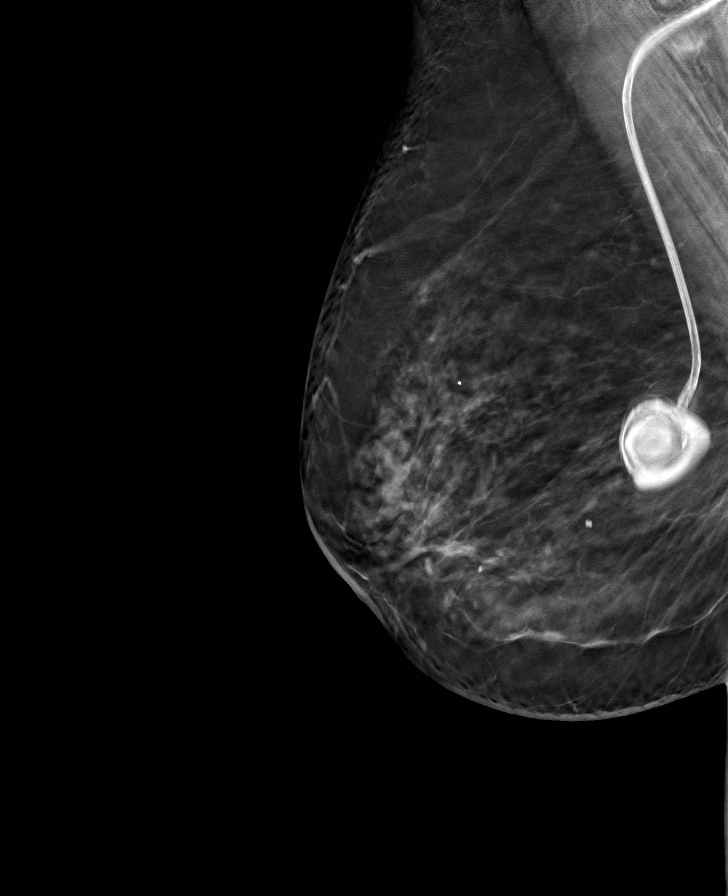

[8 of 24 positions shown; findings below may reference images not displayed]

ACR Breast Density Category b: There are scattered areas of
fibroglandular density.
FINDINGS: There are no findings suspicious for malignancy.
IMPRESSION: No mammographic evidence of malignancy. A result letter of this
screening mammogram will be mailed directly to the patient.

RECOMMENDATION:
Screening mammogram in one year. (Code:[BY])

BI-RADS CATEGORY  1: Negative.

## 2020-09-12 ENCOUNTER — Other Ambulatory Visit: Payer: Self-pay | Admitting: Nurse Practitioner

## 2020-09-12 NOTE — Telephone Encounter (Signed)
Requested Prescriptions  Pending Prescriptions Disp Refills  . atorvastatin (LIPITOR) 20 MG tablet [Pharmacy Med Name: ATORVASTATIN 20 MG TABLET] 90 tablet 0    Sig: TAKE 1 TABLET BY MOUTH EVERY DAY     Cardiovascular:  Antilipid - Statins Failed - 09/12/2020  1:21 PM      Failed - Total Cholesterol in normal range and within 360 days    Cholesterol, Total  Date Value Ref Range Status  01/20/2020 241 (H) 100 - 199 mg/dL Final   Cholesterol Piccolo, Waived  Date Value Ref Range Status  11/15/2016 WILL FOLLOW  Preliminary         Failed - LDL in normal range and within 360 days    LDL Chol Calc (NIH)  Date Value Ref Range Status  01/20/2020 164 (H) 0 - 99 mg/dL Final         Failed - Triglycerides in normal range and within 360 days    Triglycerides  Date Value Ref Range Status  01/20/2020 195 (H) 0 - 149 mg/dL Final   Triglycerides Piccolo,Waived  Date Value Ref Range Status  11/15/2016 WILL FOLLOW  Preliminary         Passed - HDL in normal range and within 360 days    HDL  Date Value Ref Range Status  01/20/2020 41 >39 mg/dL Final         Passed - Patient is not pregnant      Passed - Valid encounter within last 12 months    Recent Outpatient Visits          7 months ago Type 2 diabetes mellitus with hyperglycemia, with long-term current use of insulin ([place])   [place], Jolene T, NP   11 months ago Type 2 diabetes mellitus with hyperglycemia, with long-term current use of insulin ([place])   [place], Rowena T, NP   1 year ago Hyperlipidemia associated with type 2 diabetes mellitus ([place])   [place], Jolene T, NP   1 year ago Type 2 diabetes mellitus with hyperglycemia, with long-term current use of insulin ([place])   [place], [place] T, NP   1 year ago Type 2 diabetes mellitus with hyperglycemia, with long-term current use of insulin ([place])   [place],  Barbaraann Faster, NP

## 2020-09-23 ENCOUNTER — Encounter: Payer: Self-pay | Admitting: Internal Medicine

## 2020-09-28 ENCOUNTER — Other Ambulatory Visit: Payer: Self-pay

## 2020-09-28 ENCOUNTER — Ambulatory Visit (INDEPENDENT_AMBULATORY_CARE_PROVIDER_SITE_OTHER): Payer: No Typology Code available for payment source | Admitting: Surgery

## 2020-09-28 ENCOUNTER — Encounter: Payer: Self-pay | Admitting: Surgery

## 2020-09-28 DIAGNOSIS — C50512 Malignant neoplasm of lower-outer quadrant of left female breast: Secondary | ICD-10-CM | POA: Diagnosis not present

## 2020-09-28 DIAGNOSIS — Z170 Estrogen receptor positive status [ER+]: Secondary | ICD-10-CM

## 2020-09-28 NOTE — Patient Instructions (Addendum)
I placed you in our recall list for next year. If you have not heard anything from [place] by the end of July, please call our office so we can get you scheduled.   If you have any concerns or questions, please feel free to call our office.

## 2020-09-28 NOTE — Progress Notes (Signed)
09/28/2020  History of Present Illness: Carolyn Roy is a 55 y.o. female s/p left breast lumpectomy and SLNBx on 01/12/18 for left breast cancer.  She had completed neoadjuvant chemotherapy prior to surgery and then completed adjuvant radiation.  She's currently on Arimidex.  She had a mammogram on 09/09/20 which did not show any suspicious findings.  Today, patient reports that she's doing well overall.  Reports some pain that is stable in the left lateral portion of the breast and also some pain at the right port-a-cath location.    Past Medical History: Past Medical History:  Diagnosis Date   Allergy    Breast cancer ([place])    Cancer ([place]) 06/15/2017   5.1 cm, T3,N1 (clinical): ER/ PR positive, Her 2 neu not overexpressed, High Ki 67. Neuoadjuvant chemotherapy.    Depression    Diabetes mellitus without complication ([place]) 0000000   Edema of left upper extremity    Endometriosis    Family history of breast cancer    Headache    migraines   Hyperlipidemia    Lymphedema of left arm    Ovarian mass    Personal history of chemotherapy    Personal history of radiation therapy    Pneumonia    2018     Past Surgical History: Past Surgical History:  Procedure Laterality Date   AXILLARY LYMPH NODE BIOPSY Left 07/14/2017   Procedure: INSERTION GEL MARK CLIP LEFT AXILLA;  Surgeon: Robert Bellow, MD;  Location: [facility];  Service: General;  Laterality: Left;   BREAST BIOPSY Left    Dr Orlene Och BREAST METASTATIC CARCINOMA   BREAST LUMPECTOMY Left 01/12/2018   COLONOSCOPY WITH PROPOFOL N/A 12/27/2019   Procedure: COLONOSCOPY WITH PROPOFOL;  Surgeon: Virgel Manifold, MD;  Location: [facility];  Service: Endoscopy;  Laterality: N/A;   OOPHORECTOMY     PARTIAL MASTECTOMY WITH NEEDLE LOCALIZATION Left 01/12/2018   Procedure: PARTIAL MASTECTOMY WITH NEEDLE LOCALIZATION;  Surgeon: Robert Bellow, MD;  Location: [facility];  Service: General;  Laterality: Left;   PORTACATH  PLACEMENT Right 07/14/2017   Procedure: INSERTION PORT-A-CATH;  Surgeon: Robert Bellow, MD;  Location: [facility];  Service: General;  Laterality: Right;   SENTINEL NODE BIOPSY Left 01/12/2018   Procedure: SENTINEL NODE BIOPSY;  Surgeon: Robert Bellow, MD;  Location: [facility];  Service: General;  Laterality: Left;   TUBAL LIGATION      Home Medications: Prior to Admission medications   Medication Sig Start Date End Date Taking? Authorizing Provider  aspirin-acetaminophen-caffeine (EXCEDRIN MIGRAINE) [phone] MG tablet Take 2 tablets by mouth daily as needed for headache.    Yes [provider]  atorvastatin (LIPITOR) 20 MG tablet TAKE 1 TABLET BY MOUTH EVERY DAY 09/12/20  Yes Cannady, Jolene T, NP  Dulaglutide (TRULICITY) [id number] 0000000 SOPN Inject 0.75 mg into the skin once a week. 10/18/19  Yes Cannady, Jolene T, NP  DULoxetine (CYMBALTA) 60 MG capsule Take 60 mg by mouth daily.   Yes [provider]  glucose blood (ONE TOUCH ULTRA TEST) test strip Use up to 4 times/day 06/01/15  Yes Kathrine Haddock, NP  goserelin (ZOLADEX) 3.6 MG injection Inject 3.6 mg into the skin every 28 (twenty-eight) days.   Yes [provider]  Insulin Pen Needle 31G X 8 MM MISC Use daily to administer insulin 10/18/19  Yes Cannady, Jolene T, NP  JARDIANCE 25 MG TABS tablet Take 25 mg by mouth daily. 12/25/19  Yes [provider]  lamoTRIgine (LAMICTAL)  25 MG tablet Take 50 mg by mouth daily. 08/17/20  Yes [provider]  oxyCODONE (OXY IR/ROXICODONE) 5 MG immediate release tablet Take 1 tablet (5 mg total) by mouth 2 (two) times daily as needed for severe pain. Must last 30 days 11/01/20 12/01/20 Yes Milinda Pointer, MD  tamoxifen (NOLVADEX) 10 MG tablet Take 1 tablet (10 mg total) by mouth daily. 04/22/20  Yes Cammie Sickle, MD  traZODone (DESYREL) 50 MG tablet Take 100 mg by mouth at bedtime as needed.   Yes [provider]  Vitamin D, Ergocalciferol,  (DRISDOL) 1.25 MG (50000 UNIT) CAPS capsule TAKE 1 CAPSULE BY MOUTH ONE TIME PER WEEK 06/03/20  Yes Cammie Sickle, MD    Allergies: Allergies  Allergen Reactions   Aspirin Nausea And Vomiting    Review of Systems: Review of Systems  Constitutional:  Negative for chills and fever.  Respiratory:  Negative for shortness of breath.   Cardiovascular:  Negative for chest pain.  Gastrointestinal:  Negative for abdominal pain, nausea and vomiting.  Musculoskeletal:        Pain over lateral left breast and right port-a-cath   Physical Exam LMP  (LMP Unknown) Comment: LAST PERIOD IN MAY 2019 WHEN SHE STARTED CHEMO CONSTITUTIONAL: No acute distress, well nourished. HEENT:  Normocephalic, atraumatic, extraocular motion intact. RESPIRATORY:  Lungs are clear, and breath sounds are equal bilaterally. Normal respiratory effort without pathologic use of accessory muscles. CARDIOVASCULAR: Heart is regular without murmurs, gallops, or rubs. BREAST:  right breast without any palpable masses, skin changes, or nipple changes.  No right axillary lymphadenopathy.  Patient has a right port-a-cath in place with well healed scar.  There is tenderness to palpation over the port site.  Left breast s/p lumpectomy in outer lower quadrant, with well healed incision.  No palpable masses, skin changes, or nipple changes.  Left axilla s/p SLNBx with well healed scar.  No palpable lymphadenopathy. NEUROLOGIC:  Motor and sensation is grossly normal.  Cranial nerves are grossly intact. PSYCH:  Alert and oriented to person, place and time. Affect is normal.  Labs/Imaging: Mammogram 09/09/20: FINDINGS: There are no findings suspicious for malignancy.   IMPRESSION: No mammographic evidence of malignancy. A result letter of this screening mammogram will be mailed directly to the patient.   RECOMMENDATION: Screening mammogram in one year. (Code:SM-B-01Y)   BI-RADS CATEGORY  1: Negative.  Assessment and  Plan: This is a 55 y.o. female s/p left breast lumpectomy and SLNBx  --Patient is about 2.5 years out from her surgery.  She's doing well except for some neuropathy, which appears to be stable.  Mammogram on 09/09/20 did not show any suspicious findings and exam today is also reassuring. --The patient reports pain at the port-a-cath site.  From surgical standpoint, discussed with her that we can proceed with port-a-cath removal as an office procedure when cleared by Dr. Rogue Bussing.  Right now it is still being used per patient.  She sees him next month and will ask him about the port. --Follow up in a year with screening mammogram, or sooner if any issues or port-a-cath can be removed.  Face-to-face time spent with the patient and care providers was 25 minutes, with more than 50% of the time spent counseling, educating, and coordinating care of the patient.     Melvyn Neth, [facility]

## 2020-10-20 ENCOUNTER — Other Ambulatory Visit: Payer: Self-pay | Admitting: *Deleted

## 2020-10-20 DIAGNOSIS — Z170 Estrogen receptor positive status [ER+]: Secondary | ICD-10-CM

## 2020-10-20 DIAGNOSIS — C50512 Malignant neoplasm of lower-outer quadrant of left female breast: Secondary | ICD-10-CM

## 2020-10-21 ENCOUNTER — Other Ambulatory Visit: Payer: Self-pay

## 2020-10-21 ENCOUNTER — Inpatient Hospital Stay (HOSPITAL_BASED_OUTPATIENT_CLINIC_OR_DEPARTMENT_OTHER): Payer: No Typology Code available for payment source | Admitting: Internal Medicine

## 2020-10-21 ENCOUNTER — Inpatient Hospital Stay: Payer: No Typology Code available for payment source | Attending: Internal Medicine

## 2020-10-21 ENCOUNTER — Inpatient Hospital Stay: Payer: No Typology Code available for payment source

## 2020-10-21 VITALS — BP 102/71 | HR 84 | Temp 97.8°F | Resp 18 | Wt 174.0 lb

## 2020-10-21 DIAGNOSIS — M2550 Pain in unspecified joint: Secondary | ICD-10-CM | POA: Diagnosis not present

## 2020-10-21 DIAGNOSIS — Z8042 Family history of malignant neoplasm of prostate: Secondary | ICD-10-CM | POA: Insufficient documentation

## 2020-10-21 DIAGNOSIS — C50512 Malignant neoplasm of lower-outer quadrant of left female breast: Secondary | ICD-10-CM | POA: Diagnosis not present

## 2020-10-21 DIAGNOSIS — Z833 Family history of diabetes mellitus: Secondary | ICD-10-CM | POA: Diagnosis not present

## 2020-10-21 DIAGNOSIS — Z800 Family history of malignant neoplasm of digestive organs: Secondary | ICD-10-CM | POA: Insufficient documentation

## 2020-10-21 DIAGNOSIS — Z79899 Other long term (current) drug therapy: Secondary | ICD-10-CM | POA: Insufficient documentation

## 2020-10-21 DIAGNOSIS — Z95828 Presence of other vascular implants and grafts: Secondary | ICD-10-CM

## 2020-10-21 DIAGNOSIS — Z886 Allergy status to analgesic agent status: Secondary | ICD-10-CM | POA: Insufficient documentation

## 2020-10-21 DIAGNOSIS — R5383 Other fatigue: Secondary | ICD-10-CM | POA: Insufficient documentation

## 2020-10-21 DIAGNOSIS — I890 Lymphedema, not elsewhere classified: Secondary | ICD-10-CM | POA: Insufficient documentation

## 2020-10-21 DIAGNOSIS — R1310 Dysphagia, unspecified: Secondary | ICD-10-CM | POA: Insufficient documentation

## 2020-10-21 DIAGNOSIS — Z803 Family history of malignant neoplasm of breast: Secondary | ICD-10-CM | POA: Insufficient documentation

## 2020-10-21 DIAGNOSIS — Z90721 Acquired absence of ovaries, unilateral: Secondary | ICD-10-CM | POA: Diagnosis not present

## 2020-10-21 DIAGNOSIS — Z170 Estrogen receptor positive status [ER+]: Secondary | ICD-10-CM

## 2020-10-21 DIAGNOSIS — C773 Secondary and unspecified malignant neoplasm of axilla and upper limb lymph nodes: Secondary | ICD-10-CM | POA: Insufficient documentation

## 2020-10-21 DIAGNOSIS — F17210 Nicotine dependence, cigarettes, uncomplicated: Secondary | ICD-10-CM | POA: Insufficient documentation

## 2020-10-21 DIAGNOSIS — Z8379 Family history of other diseases of the digestive system: Secondary | ICD-10-CM | POA: Insufficient documentation

## 2020-10-21 DIAGNOSIS — Z5111 Encounter for antineoplastic chemotherapy: Secondary | ICD-10-CM | POA: Diagnosis not present

## 2020-10-21 DIAGNOSIS — M549 Dorsalgia, unspecified: Secondary | ICD-10-CM | POA: Insufficient documentation

## 2020-10-21 DIAGNOSIS — Z79810 Long term (current) use of selective estrogen receptor modulators (SERMs): Secondary | ICD-10-CM | POA: Insufficient documentation

## 2020-10-21 LAB — CBC WITH DIFFERENTIAL/PLATELET
Abs Immature Granulocytes: 0.03 10*3/uL (ref 0.00–0.07)
Basophils Absolute: 0 10*3/uL (ref 0.0–0.1)
Basophils Relative: 1 %
Eosinophils Absolute: 0.1 10*3/uL (ref 0.0–0.5)
Eosinophils Relative: 1 %
HCT: 44.7 % (ref 36.0–46.0)
Hemoglobin: 14.5 g/dL (ref 12.0–15.0)
Immature Granulocytes: 0 %
Lymphocytes Relative: 22 %
Lymphs Abs: 1.5 10*3/uL (ref 0.7–4.0)
MCH: 31.9 pg (ref 26.0–34.0)
MCHC: 32.4 g/dL (ref 30.0–36.0)
MCV: 98.5 fL (ref 80.0–100.0)
Monocytes Absolute: 0.3 10*3/uL (ref 0.1–1.0)
Monocytes Relative: 5 %
Neutro Abs: 4.7 10*3/uL (ref 1.7–7.7)
Neutrophils Relative %: 71 %
Platelets: 196 10*3/uL (ref 150–400)
RBC: 4.54 MIL/uL (ref 3.87–5.11)
RDW: 12.8 % (ref 11.5–15.5)
WBC: 6.7 10*3/uL (ref 4.0–10.5)
nRBC: 0 % (ref 0.0–0.2)

## 2020-10-21 LAB — COMPREHENSIVE METABOLIC PANEL
ALT: 16 U/L (ref 0–44)
AST: 15 U/L (ref 15–41)
Albumin: 3.6 g/dL (ref 3.5–5.0)
Alkaline Phosphatase: 57 U/L (ref 38–126)
Anion gap: 6 (ref 5–15)
BUN: 12 mg/dL (ref 6–20)
CO2: 30 mmol/L (ref 22–32)
Calcium: 9 mg/dL (ref 8.9–10.3)
Chloride: 104 mmol/L (ref 98–111)
Creatinine, Ser: 0.88 mg/dL (ref 0.44–1.00)
GFR, Estimated: >60 mL/min (ref >60)
Glucose, Bld: 252 mg/dL — ABNORMAL HIGH (ref 70–99)
Potassium: 4.4 mmol/L (ref 3.5–5.1)
Sodium: 140 mmol/L (ref 135–145)
Total Bilirubin: 0.4 mg/dL (ref 0.3–1.2)
Total Protein: 6.5 g/dL (ref 6.5–8.1)

## 2020-10-21 MED ORDER — HEPARIN SOD (PORK) LOCK FLUSH 100 UNIT/ML IV SOLN
500.0000 [IU] | Freq: Once | INTRAVENOUS | Status: AC
  Administered 2020-10-21: 500 [IU]
  Filled 2020-10-21: qty 5

## 2020-10-21 MED ORDER — GOSERELIN ACETATE 10.8 MG ~~LOC~~ IMPL
10.8000 mg | DRUG_IMPLANT | Freq: Once | SUBCUTANEOUS | Status: AC
  Administered 2020-10-21: 10.8 mg via SUBCUTANEOUS
  Filled 2020-10-21: qty 10.8

## 2020-10-21 NOTE — Progress Notes (Signed)
Patient reports nausea and vomiting over the last month and constipation

## 2020-10-21 NOTE — Assessment & Plan Note (Addendum)
#   Breast cancer Stage III ER/PR pos her 2 NEG. currently on Tamoxifen+ Zoldadex.  No clinical evidence of recurrence; STABLE. adjuvant Zometa q6M; Zoladex today q3M.  # Currently on Tamoxifen 10 mg [lower dose; previous intolerance to 20 mg/AI;].   # Nausea/vomitting x1 M; dysphagia- solids/liquids; check barium esophagogram.  Colonoscopy- again due in NOV 2022.  Recommend talking to GI regarding need for upper endoscopy.  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   #Lymphedema-left chest walls/p physical therapy- stable   # DM-2 on insulin- STABLE [A1c-6.8]Blood glucose-162;   Defer to PCP  # Port malfunction: flush; s/p TPA-STABLE.   # DISPOSITION: # dg esophagogram ASAP # Zoladex Today; NO ZOMETA # Follow up in 4 month;  MD;cbc/cmp/ca-27-29; Zoladex-Zometa  port flush. Dr.B

## 2020-10-21 NOTE — Progress Notes (Signed)
[name] CONSULT NOTE  Patient Care Team: [name], NP as PCP - General (Nurse Practitioner) Bary Castilla [name], MD (General Surgery) [name], [name], [name] (Obstetrics and Gynecology) Cammie Sickle, MD as Consulting Physician (Oncology) Vanita Ingles, RN as Case Manager (General Practice) Lovell Sheehan, MD as Consulting Physician (Orthopedic Surgery)  CHIEF COMPLAINTS/PURPOSE OF CONSULTATION: Breast cancer  Oncology History Overview Note  # MAY 2019-  clinical stage IIIA (T3N1Mx) left breast cancer s/p biopsy on 06/14/2017. -Pathology revealed grade III invasive ductal carcinoma. -Axillary FNA revealed malignant cells c/w metastatic carcinoma. Tumor was ER + (90%), PR + (30%), Her2/neu - and Ki67 70%.  CA27.29 was 7.8 on 06/14/2017.  # She received 4 cycles of AC with Neulasta support (07/20/2017 - 08/31/2017).;  neoadjuvant Taxol on 09/14/2017.  #DEC 2019- Lumpectomy/sentinel lymph node biopsy [Dr.[name]]-complete pathologic response  # s/p RT [delayed sec to wound infection; Dr.[name]] finished RT [4/12]  # April 14th 2020- START TAM; stopped in mid-May secondary intolerance [severe migraines].  # 18th May 2020-start Arimidex [hormonal profile-postmenopausal;add Zoladex q3M]; STOPPED sec intolerance/joint pain; NOV 2021- STARTED AROMASIN; Stopped x 2 months sec to extreme fatigue/severe joint pains.   # July 2022- START tamoxifen 10 mg a day.  # PN-2 sec to taxol Carolyn Roy management/ # may 2019- Endometrial sampling [Dr. Secord/Berchuck]-negative for malignancy/ # DM-2- poorly controlled.   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   # PAP SMEAR- RECOMMENDED 2022- summer  -------------------------------------------  DIAGNOSIS: left breast cancer  STAGE:  III       ;GOALS: cure  CURRENT/MOST RECENT THERAPY Tam    Cancer  of midline of breast, left (HCC)  06/15/2017 Initial Diagnosis   Cancer of midline of breast, left ([place])   07/20/2017 - 11/16/2017 Chemotherapy   The patient had dexamethasone (DECADRON) 4 MG tablet, 1 of 1 cycle, Start date: --, End date: -- DOXOrubicin (ADRIAMYCIN) chemo injection 122 mg, 60 mg/m2 = 122 mg, Intravenous,  Once, 4 of 4 cycles Administration: 122 mg (07/20/2017), 122 mg (08/03/2017), 122 mg (08/17/2017), 122 mg (08/31/2017) palonosetron (ALOXI) injection 0.25 mg, 0.25 mg, Intravenous,  Once, 4 of 4 cycles Administration: 0.25 mg (07/20/2017), 0.25 mg (08/03/2017), 0.25 mg (08/17/2017), 0.25 mg (08/31/2017) pegfilgrastim (NEULASTA) injection 6 mg, 6 mg, Subcutaneous, Once, 5 of 5 cycles Administration: 6 mg (07/21/2017), 6 mg (08/04/2017), 6 mg (08/18/2017), 6 mg (09/01/2017) cyclophosphamide (CYTOXAN) 1,220 mg in sodium chloride 0.9 % 250 mL chemo infusion, 600 mg/m2 = 1,220 mg, Intravenous,  Once, 4 of 4 cycles Administration: 1,220 mg (07/20/2017), 1,220 mg (08/03/2017), 1,220 mg (08/17/2017), 1,220 mg (08/31/2017) PACLitaxel (TAXOL) 162 mg in sodium chloride 0.9 % 250 mL chemo infusion (</= 53m/m2), 80 mg/m2 = 162 mg, Intravenous,  Once, 10 of 12 cycles Dose modification: 65 mg/m2 (original dose 80 mg/m2, Cycle 13, Reason: Provider Judgment, Comment: neuropathy) Administration: 162 mg (09/14/2017), 162 mg (09/21/2017), 162 mg (09/28/2017), 162 mg (10/05/2017), 162 mg (10/12/2017), 162 mg (10/19/2017), 162 mg (10/26/2017), 132 mg (11/02/2017), 132 mg (11/09/2017), 132 mg (11/16/2017) fosaprepitant (EMEND) 150 mg, dexamethasone (DECADRON) 12 mg in sodium chloride 0.9 % 145 mL IVPB, , Intravenous,  Once, 4 of 4 cycles Administration:  (07/20/2017),  (08/03/2017),  (08/17/2017),  (08/31/2017)   for chemotherapy treatment.     Malignant neoplasm of lower-outer quadrant of left breast of female, [place] positive [place]  11/[place]/2017 Initial  Diagnosis   Malignant neoplasm of lower-outer quadrant of left  breast of female, estrogen receptor positive ([place])    HISTORY OF PRESENTING ILLNESS:   Carolyn Roy 55 y.o.  female with a history of stage III ER PR positive HER-2/neu negative breast cancer currently on tamoxifen 10 mg a day [intolerance] plus Zoladexq3 /Zometa every 6 months is here for follow-up.  Patient states her joint pains are improved since stopping Lyrica.  She continues to have some pains not any worse.  She complains of difficulty swallowing both solids and liquids for the last 1 month.  No weight loss.  Positive for regurgitation.   No new lumps or bumps.  Review of Systems  Constitutional:  Positive for malaise/fatigue. Negative for chills, diaphoresis, fever and weight loss.  HENT:  Negative for nosebleeds and sore throat.   Eyes:  Negative for double vision.  Respiratory:  Negative for cough, hemoptysis, sputum production, shortness of breath and wheezing.   Cardiovascular:  Negative for chest pain, palpitations, orthopnea and leg swelling.  Gastrointestinal:  Negative for abdominal pain, blood in stool, constipation, diarrhea, heartburn, melena, nausea and vomiting.  Genitourinary:  Negative for dysuria, frequency and urgency.  Musculoskeletal:  Positive for back pain and joint pain.  Skin: Negative.  Negative for itching and rash.  Neurological:  Positive for tingling. Negative for dizziness, focal weakness, weakness and headaches.  Endo/Heme/Allergies:  Does not bruise/bleed easily.  Psychiatric/Behavioral:  Negative for depression. The patient is not nervous/anxious and does not have insomnia.     MEDICAL HISTORY:  Past Medical History:  Diagnosis Date   Allergy    Breast cancer ([place])    Cancer ([place]) 06/15/2017   5.1 cm, T3,N1 (clinical): ER/ PR positive, Her 2 neu not overexpressed, High Ki 67. Neuoadjuvant chemotherapy.    Depression    Diabetes mellitus without complication ([place]) 4235   Edema of left upper extremity    Endometriosis    Family history  of breast cancer    Headache    migraines   Hyperlipidemia    Lymphedema of left arm    Ovarian mass    Personal history of chemotherapy    Personal history of radiation therapy    Pneumonia    2018    SURGICAL HISTORY: Past Surgical History:  Procedure Laterality Date   AXILLARY LYMPH NODE BIOPSY Left 07/14/2017   Procedure: INSERTION GEL MARK CLIP LEFT AXILLA;  Surgeon: Robert Bellow, MD;  Location: [facility] ORS;  Service: General;  Laterality: Left;   BREAST BIOPSY Left    Dr Orlene Och BREAST METASTATIC CARCINOMA   BREAST LUMPECTOMY Left 01/12/2018   COLONOSCOPY WITH PROPOFOL N/A 12/27/2019   Procedure: COLONOSCOPY WITH PROPOFOL;  Surgeon: Virgel Manifold, MD;  Location: [facility] END[facility]COPY;  Service: Endoscopy;  Laterality: N/A;   OOPHORECTOMY     PARTIAL MASTECTOMY WITH NEEDLE LOCALIZATION Left 01/12/2018   Procedure: PARTIAL MASTECTOMY WITH NEEDLE LOCALIZATION;  Surgeon: Robert Bellow, MD;  Location: [facility] ORS;  Service: General;  Laterality: Left;   PORTACATH PLACEMENT Right 07/14/2017   Procedure: INSERTION PORT-A-CATH;  Surgeon: Robert Bellow, MD;  Location: [facility] ORS;  Service: General;  Laterality: Right;   SENTINEL NODE BIOPSY Left 01/12/2018   Procedure: SENTINEL NODE BIOPSY;  Surgeon: Robert Bellow, MD;  Location: [facility] OR[facility];  Service: General;  Laterality: Left;   TUBAL LIGATION      SOCIAL HISTORY: Social History   Socioeconomic History   Marital status: Married    Spouse name:  Not on file   Number of children: Not on file   Years of education: Not on file   Highest education level: Not on file  Occupational History   Not on file  Tobacco Use   Smoking status: Every Day    Packs/day: 1.00    Years: 11.00    Pack years: 11.00    Types: Cigarettes   Smokeless tobacco: Former    Types: Snuff  Vaping Use   Vaping Use: Never used  Substance and Sexual Activity   Alcohol use: No    Alcohol/week: 0.0 standard drinks   Drug use: No   Sexual  activity: Yes  Other Topics Concern   Not on file  Social History Narrative   Not on file   Social Determinants of Health   Financial Resource Strain: Not on file  Food Insecurity: Not on file  Transportation Needs: Not on file  Physical Activity: Not on file  Stress: Not on file  Social Connections: Not on file  Intimate Partner Violence: Not on file    FAMILY HISTORY: Family History  Problem Relation Age of Onset   Colon cancer Mother    Cancer Mother    Other Father        No info about father or paternal relatives   Diabetes Brother    Pancreatitis Brother    Prostate cancer Brother 33       currently 75 / maternal half-brother   Breast cancer Maternal Grandmother 33       deceased 24s   Colon cancer Maternal Grandmother    Breast cancer Maternal Aunt 63       currently 51   Breast cancer Other 54       mother's sister; deceased 40   Breast cancer Other        mother's sister; age at dx unknown    ALLERGIES:  is allergic to aspirin.  MEDICATIONS:  Current Outpatient Medications  Medication Sig Dispense Refill   aspirin-acetaminophen-caffeine (EXCEDRIN MIGRAINE) 250-250-65 MG tablet Take 2 tablets by mouth daily as needed for headache.      atorvastatin (LIPITOR) 20 MG tablet TAKE 1 TABLET BY MOUTH EVERY DAY 90 tablet 0   DULoxetine (CYMBALTA) 60 MG capsule Take 60 mg by mouth daily.     goserelin (ZOLADEX) 3.6 MG injection Inject 3.6 mg into the skin every 28 (twenty-eight) days.     JARDIANCE 25 MG TABS tablet Take 25 mg by mouth daily.     lamoTRIgine (LAMICTAL) 25 MG tablet Take 50 mg by mouth daily.     [START ON 11/01/2020] oxyCODONE (OXY IR/ROXICODONE) 5 MG immediate release tablet Take 1 tablet (5 mg total) by mouth 2 (two) times daily as needed for severe pain. Must last 30 days 60 tablet 0   traZODone (DESYREL) 50 MG tablet Take 100 mg by mouth at bedtime as needed.     Vitamin D, Ergocalciferol, (DRISDOL) 1.25 MG (50000 UNIT) CAPS capsule TAKE 1  CAPSULE BY MOUTH ONE TIME PER WEEK 12 capsule 1   glucose blood (ONE TOUCH ULTRA TEST) test strip Use up to 4 times/day (Patient not taking: Reported on 10/21/2020) 100 each 12   Insulin Pen Needle 31G X 8 MM MISC Use daily to administer insulin (Patient not taking: Reported on 10/21/2020) 100 each 12   tamoxifen (NOLVADEX) 10 MG tablet TAKE 1 TABLET BY MOUTH EVERY DAY 90 tablet 1   TRULICITY 1.03 PR/9.4VO SOPN INJECT 0.75 MG INTO THE SKIN ONCE  A WEEK. 6 mL 0   No current facility-administered medications for this visit.   Facility-Administered Medications Ordered in Other Visits  Medication Dose Route Frequency Provider Last Rate Last Admin   heparin lock flush 100 unit/mL  500 Units Intravenous Once Corcoran, Melissa C, MD       sodium chloride flush (NS) 0.9 % injection 10 mL  10 mL Intravenous Once Lequita Asal, MD          .  PHYSICAL EXAMINATION: ECOG PERFORMANCE STATUS: 0 - Asymptomatic  Vitals:   10/21/20 1331  BP: 102/71  Pulse: 84  Resp: 18  Temp: 97.8 F (36.6 C)  SpO2: 96%   Filed Weights   10/21/20 1329  Weight: 174 lb (78.9 kg)    Physical Exam HENT:     Head: Normocephalic and atraumatic.     Mouth/Throat:     Pharynx: No oropharyngeal exudate.  Eyes:     Pupils: Pupils are equal, round, and reactive to light.  Cardiovascular:     Rate and Rhythm: Normal rate and regular rhythm.  Pulmonary:     Effort: No respiratory distress.     Breath sounds: No wheezing.  Abdominal:     General: Bowel sounds are normal. There is no distension.     Palpations: Abdomen is soft. There is no mass.     Tenderness: There is no abdominal tenderness. There is no guarding or rebound.  Musculoskeletal:        General: No tenderness. Normal range of motion.     Cervical back: Normal range of motion and neck supple.  Skin:    General: Skin is warm.     Comments: Right and left BREAST exam [in the presence of nurse]- no unusual skin changes or dominant masses felt.  Surgical scars noted.    Neurological:     Mental Status: She is alert and oriented to person, place, and time.  Psychiatric:        Mood and Affect: Affect normal.     LABORATORY DATA:  I have reviewed the data as listed Lab Results  Component Value Date   WBC 6.7 10/21/2020   HGB 14.5 10/21/2020   HCT 44.7 10/21/2020   MCV 98.5 10/21/2020   PLT 196 10/21/2020   Recent Labs    04/22/20 1345 07/20/20 1254 10/21/20 1252  NA 138 136 140  K 3.4* 3.7 4.4  CL 104 101 104  CO2 '25 27 30  ' GLUCOSE 231* 186* 252*  BUN '11 14 12  ' CREATININE 0.63 0.63 0.88  CALCIUM 8.7* 8.7* 9.0  GFRNONAA >60 >60 >60  PROT 6.8 6.8 6.5  ALBUMIN 3.8 3.7 3.6  AST 13* 15 15  ALT '11 16 16  ' ALKPHOS 80 72 57  BILITOT 0.7 0.5 0.4    RADIOGRAPHIC STUDIES: I have personally reviewed the radiological images as listed and agreed with the findings in the report. No results found.  ASSESSMENT & PLAN:   Malignant neoplasm of lower-outer quadrant of left breast of female, estrogen receptor positive ([place]) # Breast cancer Stage III ER/PR pos her 2 NEG. currently on Tamoxifen+ Zoldadex.  No clinical evidence of recurrence; STABLE. adjuvant Zometa q6M; Zoladex today q3M.  # Currently on Tamoxifen 10 mg [lower dose; previous intolerance to 20 mg/AI;].   # Nausea/vomitting x1 M; dysphagia- solids/liquids; check barium esophagogram.  Colonoscopy- again due in NOV 2022.  Recommend talking to GI regarding need for upper endoscopy.  # Depression/ Insomnia--STABLE- On cymblata/Lamictal [DI-none as  per uptodate*] Dr.kapur]  # Joint pains-multifactorial- Improved off Lyrica [As per pt ]; Off AI-  # PN-2-3 [Pain doc] on neurontin- improved/STABLE.   #Lymphedema-left chest walls/p physical therapy- stable   # DM-2 on insulin- STABLE [A1c-6.8]Blood glucose-162;   Defer to PCP  # Port malfunction: flush; s/p TPA-STABLE.   # DISPOSITION: # dg esophagogram ASAP # Zoladex Today; NO ZOMETA # Follow up in 4 month;   MD;cbc/cmp/ca-27-29; Zoladex-Zometa  port flush. Dr.B  All questions were answered. The patient knows to call the clinic with any problems, questions or concerns.    Cammie Sickle, MD 10/27/2020 4:46 PM

## 2020-10-22 LAB — CANCER ANTIGEN 27.29: CA 27.29: 4.6 U/mL (ref 0.0–38.6)

## 2020-10-23 ENCOUNTER — Ambulatory Visit: Admission: RE | Admit: 2020-10-23 | Payer: No Typology Code available for payment source | Source: Ambulatory Visit

## 2020-10-25 ENCOUNTER — Other Ambulatory Visit: Payer: Self-pay | Admitting: Internal Medicine

## 2020-10-26 ENCOUNTER — Encounter: Payer: Self-pay | Admitting: Internal Medicine

## 2020-10-26 ENCOUNTER — Other Ambulatory Visit: Payer: Self-pay | Admitting: Nurse Practitioner

## 2020-10-26 NOTE — Telephone Encounter (Signed)
Requested medications are due for refill today.  yes  Requested medications are on the active medications list.  yes  Last refill. 10/18/2019  Future visit scheduled.   no  Notes to clinic.  Pt was last seen 01/20/2020 and per that visit not pt was to return in 3 months for F/U.  Pt is more than 3 months overdue. Please advise.

## 2020-10-26 NOTE — Telephone Encounter (Signed)
Patient is overdue for appointment. Please call to schedule.  

## 2020-10-27 ENCOUNTER — Encounter: Payer: Self-pay | Admitting: Internal Medicine

## 2020-10-27 NOTE — Telephone Encounter (Signed)
PT is scheduled for 11/11/2020 at 3:00 pm.

## 2020-10-28 ENCOUNTER — Encounter: Payer: No Typology Code available for payment source | Admitting: Pain Medicine

## 2020-11-11 ENCOUNTER — Encounter: Payer: Self-pay | Admitting: Nurse Practitioner

## 2020-11-11 ENCOUNTER — Ambulatory Visit (INDEPENDENT_AMBULATORY_CARE_PROVIDER_SITE_OTHER): Payer: No Typology Code available for payment source | Admitting: Nurse Practitioner

## 2020-11-11 ENCOUNTER — Other Ambulatory Visit: Payer: Self-pay

## 2020-11-11 VITALS — BP 98/66 | HR 80 | Temp 98.6°F | Wt 175.0 lb

## 2020-11-11 DIAGNOSIS — C50812 Malignant neoplasm of overlapping sites of left female breast: Secondary | ICD-10-CM

## 2020-11-11 DIAGNOSIS — G620 Drug-induced polyneuropathy: Secondary | ICD-10-CM | POA: Diagnosis not present

## 2020-11-11 DIAGNOSIS — T451X5A Adverse effect of antineoplastic and immunosuppressive drugs, initial encounter: Secondary | ICD-10-CM

## 2020-11-11 DIAGNOSIS — E785 Hyperlipidemia, unspecified: Secondary | ICD-10-CM

## 2020-11-11 DIAGNOSIS — F17219 Nicotine dependence, cigarettes, with unspecified nicotine-induced disorders: Secondary | ICD-10-CM

## 2020-11-11 DIAGNOSIS — E1169 Type 2 diabetes mellitus with other specified complication: Secondary | ICD-10-CM | POA: Diagnosis not present

## 2020-11-11 DIAGNOSIS — F322 Major depressive disorder, single episode, severe without psychotic features: Secondary | ICD-10-CM

## 2020-11-11 DIAGNOSIS — Z794 Long term (current) use of insulin: Secondary | ICD-10-CM

## 2020-11-11 DIAGNOSIS — E1165 Type 2 diabetes mellitus with hyperglycemia: Secondary | ICD-10-CM

## 2020-11-11 DIAGNOSIS — E559 Vitamin D deficiency, unspecified: Secondary | ICD-10-CM

## 2020-11-11 LAB — MICROALBUMIN, URINE WAIVED
Creatinine, Urine Waived: 50 mg/dL (ref 10–300)
Microalb, Ur Waived: 10 mg/L (ref 0–19)
Microalb/Creat Ratio: <30 mg/g (ref <30)

## 2020-11-11 LAB — BAYER DCA HB A1C WAIVED: HB A1C (BAYER DCA - WAIVED): 9.7 % — ABNORMAL HIGH (ref 4.8–5.6)

## 2020-11-11 MED ORDER — GLUCOSE BLOOD VI STRP: ORAL_STRIP | 12 refills | Status: AC

## 2020-11-11 MED ORDER — ONETOUCH ULTRASOFT LANCETS MISC: 12 refills | Status: DC

## 2020-11-11 MED ORDER — ONETOUCH VERIO W/DEVICE KIT: PACK | 0 refills | Status: DC

## 2020-11-11 MED ORDER — TRULICITY 1.5 MG/0.5ML ~~LOC~~ SOAJ: 1.5000 mg | SUBCUTANEOUS | 4 refills | Status: DC

## 2020-11-11 NOTE — Progress Notes (Signed)
BP 98/66   Pulse 80   Temp 98.6 F (37 C) (Oral)   Wt 175 lb (79.4 kg)   LMP  (LMP Unknown) Comment: LAST PERIOD IN MAY 2019 WHEN SHE STARTED CHEMO  SpO2 96%   BMI 30.04 kg/m    Subjective:    Patient ID: Carolyn Roy, female    DOB: [birth year], 55 y.o.   MRN: [id number]  HPI: Carolyn Roy is a 55 y.o. female  Chief Complaint  Patient presents with   Hypertension   Hyperlipidemia   Diabetes    Patient states she thinks everything is fine, but she has not had her levels checked in so long. Patient denies having a recent Diabetic Eye Exam.    DIABETES Last A1C 6.8% December 2021. Continues on Jardiance 25 MG and Trulicity 2.83 MG weekly.   Has tried Metformin, no longer wishes to take due to breast cancer, she had read online this could cause breast CA.  She is also followed by pain clinic for chronic pain -- last visit 09/01/20 -- Oxycodone.  She is a smoker, 1/2 PPD.  Quit for 6 years, but then started back.  She started smoking at age 70, smoked 34-35 years total. Hypoglycemic episodes:no Polydipsia/polyuria: no Visual disturbance: no Chest pain: no Paresthesias: no Glucose Monitoring: yes  Accucheck frequency: not checking  Fasting glucose:   Post prandial:  Evening:   Before meals:  Taking Insulin?: yes  Long acting insulin:   Short acting insulin: Blood Pressure Monitoring: not checking Retinal Examination: Not Up To Date Foot Exam: Up to Date Pneumovax: Up To Date Influenza: Up To Date Aspirin: no   HYPERLIPIDEMIA Taking Atorvastatin 20 MG daily. Satisfied with current treatment? yes Duration of hyperlipidemia: chronic Cholesterol medication side effects: no Cholesterol supplements: none Aspirin: no Recent stressors: no Recurrent headaches: no Visual changes: no Palpitations: no Dyspnea: no Chest pain: no Lower extremity edema: no Dizzy/lightheaded: no  The 10-year ASCVD risk score (Arnett DK, et al., 2019) is: 8.1%   Values used to  calculate the score:     Age: 3 years     Sex: Female     Is Non-Hispanic African American: No     Diabetic: Yes     Tobacco smoker: Yes     Systolic Blood Pressure: 98 mmHg     Is BP treated: No     HDL Cholesterol: 41 mg/dL     Total Cholesterol: 241 mg/dL   DEPRESSION Taking Duloxetine 60 MG daily, Lamictal 150 MG every morning, and Trazodone as needed for sleep.  Followed by Dr. Nicolasa Ducking and last saw on Monday  Is currently followed by oncology for breast CA, left side.  Last saw oncology on 10/21/20 -- she is completed treatments, oral medications continue. Mood status: stable Satisfied with current treatment?: yes Symptom severity: mild  Duration of current treatment : chronic Side effects: no Medication compliance: good compliance Psychotherapy/counseling: looking for therapist Depressed mood: no Anxious mood: no Anhedonia: no Significant weight loss or gain: no Insomnia: yes hard to stay asleep Fatigue: no Feelings of worthlessness or guilt: no Impaired concentration/indecisiveness: no Suicidal ideations: no Hopelessness: no Crying spells: no Depression screen [facility] 2/9 11/11/2020 08/18/2020 01/20/2020 10/18/2019 09/18/2019  Decreased Interest 0 0 0 0 0  Down, Depressed, Hopeless 0 0 0 0 0  PHQ - 2 Score 0 0 0 0 0  Altered sleeping 1 - 2 0 -  Tired, decreased energy 0 - 0 1 -  Change in  appetite 1 - 3 2 -  Feeling bad or failure about yourself  0 - 0 0 -  Trouble concentrating 0 - 0 0 -  Moving slowly or fidgety/restless 0 - 0 0 -  Suicidal thoughts 0 - 0 0 -  PHQ-9 Score 2 - 5 3 -  Difficult doing work/chores Not difficult at all - Somewhat difficult - Not difficult at all  Some recent data might be hidden    Relevant past medical, surgical, family and social history reviewed and updated as indicated. Interim medical history since our last visit reviewed. Allergies and medications reviewed and updated.  Review of Systems  Constitutional:  Negative for activity  change, appetite change, diaphoresis, fatigue and fever.  Respiratory:  Negative for cough, chest tightness and shortness of breath.   Cardiovascular:  Negative for chest pain, palpitations and leg swelling.  Gastrointestinal: Negative.   Endocrine: Negative for cold intolerance, heat intolerance, polydipsia, polyphagia and polyuria.  Neurological: Negative.   Psychiatric/Behavioral: Negative.     Per HPI unless specifically indicated above     Objective:    BP 98/66   Pulse 80   Temp 98.6 F (37 C) (Oral)   Wt 175 lb (79.4 kg)   LMP  (LMP Unknown) Comment: LAST PERIOD IN MAY 2019 WHEN SHE STARTED CHEMO  SpO2 96%   BMI 30.04 kg/m   Wt Readings from Last 3 Encounters:  11/11/20 175 lb (79.4 kg)  10/21/20 174 lb (78.9 kg)  08/18/20 178 lb (80.7 kg)    Physical Exam Vitals and nursing note reviewed.  Constitutional:      General: She is awake.     Appearance: She is well-developed and well-groomed.  HENT:     Head: Normocephalic.     Right Ear: Hearing normal.     Left Ear: Hearing normal.  Eyes:     General: Lids are normal.        Right eye: No discharge.        Left eye: No discharge.     Conjunctiva/sclera: Conjunctivae normal.     Pupils: Pupils are equal, round, and reactive to light.  Neck:     Thyroid: No thyromegaly.     Vascular: No carotid bruit.  Cardiovascular:     Rate and Rhythm: Normal rate and regular rhythm.     Heart sounds: Normal heart sounds. No murmur heard.   No gallop.  Pulmonary:     Effort: Pulmonary effort is normal. No accessory muscle usage or respiratory distress.     Breath sounds: Normal breath sounds.  Abdominal:     General: Bowel sounds are normal.     Palpations: Abdomen is soft.  Musculoskeletal:     Cervical back: Normal range of motion and neck supple.     Right lower leg: No edema.     Left lower leg: No edema.  Skin:    General: Skin is warm and dry.  Neurological:     Mental Status: She is alert and oriented to  person, place, and time.  Psychiatric:        Attention and Perception: Attention normal.        Mood and Affect: Mood normal.        Speech: Speech normal.        Behavior: Behavior normal. Behavior is cooperative.        Thought Content: Thought content normal.    Diabetic Foot Exam - Simple   Simple Foot Form Visual Inspection No  deformities, no ulcerations, no other skin breakdown bilaterally: Yes Sensation Testing Intact to touch and monofilament testing bilaterally: Yes Pulse Check Posterior Tibialis and Dorsalis pulse intact bilaterally: Yes Comments     Results for orders placed or performed in visit on 11/11/20  Bayer DCA Hb A1c Waived  Result Value Ref Range   HB A1C (BAYER DCA - WAIVED) 9.7 (H) 4.8 - 5.6 %  Microalbumin, Urine Waived  Result Value Ref Range   Microalb, Ur Waived 10 0 - 19 mg/L   Creatinine, Urine Waived 50 10 - 300 mg/dL   Microalb/Creat Ratio <30 <30 mg/g      Assessment & Plan:   Problem List Items Addressed This Visit       Endocrine   Hyperlipidemia associated with type 2 diabetes mellitus (HCC)    Chronic, ongoing.  Continue Atorvastatin 20 MG daily.  Lipid panel today and adjust dose as needed.      Relevant Medications   Dulaglutide (TRULICITY) 1.5 LJ/4.4BE SOPN   Other Relevant Orders   Bayer DCA Hb A1c Waived (Completed)   Lipid Panel w/o Chol/HDL Ratio   Type 2 diabetes mellitus with hyperglycemia, with long-term current use of insulin (HCC) - Primary    Chronic, ongoing with A1C 9.7% today, trending up, was lost to follow-up due to chemo treatment.  Previously followed by endo, but prefers not to return at this time. Urine ALB 10 today.  Will continue Jardiance at current dose and increase Trulicity to 1.5 MG weekly, adjust further as needed.  New glucometer and supplies sent in, with education on how to check.  Recommend monitor BS at home TID and heavily focus on diet changes.   Return in 3 months.      Relevant Medications    Dulaglutide (TRULICITY) 1.5 EF/0.0FH SOPN   Other Relevant Orders   Bayer DCA Hb A1c Waived (Completed)   Microalbumin, Urine Waived (Completed)   TSH     Nervous and Auditory   Chemotherapy-induced neuropathy (HCC) (Chronic)    Chronic, ongoing.  Continue current pain management collaboration and medication regimen as prescribed by them.      Relevant Medications   lamoTRIgine (LAMICTAL) 150 MG tablet   Nicotine dependence, cigarettes, w unsp disorders    I have recommended complete cessation of tobacco use. I have discussed various options available for assistance with tobacco cessation including over the counter methods (Nicotine gum, patch and lozenges). We also discussed prescription options (Chantix, Nicotine Inhaler / Nasal Spray). The patient is not interested in pursuing any prescription tobacco cessation options at this time.  Recommend we start lung CT screening, she will plan on this next visit as has had a lot of testing recently.         Other   Severe depression (HCC)    Chronic, ongoing.  Continue current medication regimen and collaboration with psychiatry.  Currently denies SI/HI.      Cancer of midline of breast, left [facility])    Continue collaboration with oncology, recent note reviewed.      Vitamin D deficiency    Noted on past labs, recommend she take 2000 units Vitamin D3 daily.  Recheck level today.      Relevant Orders   VITAMIN D 25 Hydroxy (Vit-D Deficiency, Fractures)     Follow up plan: Return in about 3 months (around 02/11/2021) for T2DM and HLD + MOOD.

## 2020-11-11 NOTE — Assessment & Plan Note (Signed)
Chronic, ongoing.  Continue current pain management collaboration and medication regimen as prescribed by them. 

## 2020-11-11 NOTE — Assessment & Plan Note (Signed)
I have recommended complete cessation of tobacco use. I have discussed various options available for assistance with tobacco cessation including over the counter methods (Nicotine gum, patch and lozenges). We also discussed prescription options (Chantix, Nicotine Inhaler / Nasal Spray). The patient is not interested in pursuing any prescription tobacco cessation options at this time.  Recommend we start lung CT screening, she will plan on this next visit as has had a lot of testing recently.   

## 2020-11-11 NOTE — Assessment & Plan Note (Signed)
Chronic, ongoing.  Continue Atorvastatin 20 MG daily.  Lipid panel today and adjust dose as needed.

## 2020-11-11 NOTE — Assessment & Plan Note (Signed)
Chronic, ongoing.  Continue current medication regimen and collaboration with psychiatry.  Currently denies SI/HI. 

## 2020-11-11 NOTE — Assessment & Plan Note (Addendum)
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  Previously followed by endo, but prefers not to return at this time. Urine ALB 10 today.  Will continue Jardiance at current dose and increase Trulicity to 1.5 MG weekly, adjust further as needed.  New glucometer and supplies sent in, with education on how to check.  Recommend monitor BS at home TID and heavily focus on diet changes.   Return in 3 months.

## 2020-11-11 NOTE — Assessment & Plan Note (Signed)
Continue collaboration with oncology, recent note reviewed. 

## 2020-11-11 NOTE — Assessment & Plan Note (Signed)
Noted on past labs, recommend she take 2000 units Vitamin D3 daily.  Recheck level today.

## 2020-11-11 NOTE — Patient Instructions (Signed)
Diabetes Mellitus and Nutrition, Adult When you have diabetes, or diabetes mellitus, it is very important to have healthy eating habits because your blood sugar (glucose) levels are greatly affected by what you eat and drink. Eating healthy foods in the right amounts, at about the same times every day, can help you:  Control your blood glucose.  Lower your risk of heart disease.  Improve your blood pressure.  Reach or maintain a healthy weight. What can affect my meal plan? Every person with diabetes is different, and each person has different needs for a meal plan. Your health care provider may recommend that you work with a dietitian to make a meal plan that is best for you. Your meal plan may vary depending on factors such as:  The calories you need.  The medicines you take.  Your weight.  Your blood glucose, blood pressure, and cholesterol levels.  Your activity level.  Other health conditions you have, such as heart or kidney disease. How do carbohydrates affect me? Carbohydrates, also called carbs, affect your blood glucose level more than any other type of food. Eating carbs naturally raises the amount of glucose in your blood. Carb counting is a method for keeping track of how many carbs you eat. Counting carbs is important to keep your blood glucose at a healthy level, especially if you use insulin or take certain oral diabetes medicines. It is important to know how many carbs you can safely have in each meal. This is different for every person. Your dietitian can help you calculate how many carbs you should have at each meal and for each snack. How does alcohol affect me? Alcohol can cause a sudden decrease in blood glucose (hypoglycemia), especially if you use insulin or take certain oral diabetes medicines. Hypoglycemia can be a life-threatening condition. Symptoms of hypoglycemia, such as sleepiness, dizziness, and confusion, are similar to symptoms of having too much  alcohol.  Do not drink alcohol if: ? Your health care provider tells you not to drink. ? You are pregnant, may be pregnant, or are planning to become pregnant.  If you drink alcohol: ? Do not drink on an empty stomach. ? Limit how much you use to:  0-1 drink a day for women.  0-2 drinks a day for men. ? Be aware of how much alcohol is in your drink. In the [place] one drink equals one 12 oz bottle of beer (355 mL), one 5 oz glass of wine (148 mL), or one 1 oz glass of hard liquor (44 mL). ? Keep yourself hydrated with water, diet soda, or unsweetened iced tea.  Keep in mind that regular soda, juice, and other mixers may contain a lot of sugar and must be counted as carbs. What are tips for following this plan? Reading food labels  Start by checking the serving size on the "Nutrition Facts" label of packaged foods and drinks. The amount of calories, carbs, fats, and other nutrients listed on the label is based on one serving of the item. Many items contain more than one serving per package.  Check the total grams (g) of carbs in one serving. You can calculate the number of servings of carbs in one serving by dividing the total carbs by 15. For example, if a food has 30 g of total carbs per serving, it would be equal to 2 servings of carbs.  Check the number of grams (g) of saturated fats and trans fats in one serving. Choose foods that have   a low amount or none of these fats.  Check the number of milligrams (mg) of salt (sodium) in one serving. Most people should limit total sodium intake to less than 2,300 mg per day.  Always check the nutrition information of foods labeled as "low-fat" or "nonfat." These foods may be higher in added sugar or refined carbs and should be avoided.  Talk to your dietitian to identify your daily goals for nutrients listed on the label. Shopping  Avoid buying canned, pre-made, or processed foods. These foods tend to be high in fat, sodium, and added  sugar.  Shop around the outside edge of the grocery store. This is where you will most often find fresh fruits and vegetables, bulk grains, fresh meats, and fresh dairy. Cooking  Use low-heat cooking methods, such as baking, instead of high-heat cooking methods like deep frying.  Cook using healthy oils, such as olive, canola, or sunflower oil.  Avoid cooking with butter, cream, or high-fat meats. Meal planning  Eat meals and snacks regularly, preferably at the same times every day. Avoid going long periods of time without eating.  Eat foods that are high in fiber, such as fresh fruits, vegetables, beans, and whole grains. Talk with your dietitian about how many servings of carbs you can eat at each meal.  Eat 4-6 oz (112-168 g) of lean protein each day, such as lean meat, chicken, fish, eggs, or tofu. One ounce (oz) of lean protein is equal to: ? 1 oz (28 g) of meat, chicken, or fish. ? 1 egg. ?  cup (62 g) of tofu.  Eat some foods each day that contain healthy fats, such as avocado, nuts, seeds, and fish.   What foods should I eat? Fruits Berries. Apples. Oranges. Peaches. Apricots. Plums. Grapes. Mango. Papaya. Pomegranate. Kiwi. Cherries. Vegetables Lettuce. Spinach. Leafy greens, including kale, chard, collard greens, and mustard greens. Beets. Cauliflower. Cabbage. Broccoli. Carrots. Green beans. Tomatoes. Peppers. Onions. Cucumbers. Brussels sprouts. Grains Whole grains, such as whole-wheat or whole-grain bread, crackers, tortillas, cereal, and pasta. Unsweetened oatmeal. Quinoa. Brown or wild rice. Meats and other proteins Seafood. Poultry without skin. Lean cuts of poultry and beef. Tofu. Nuts. Seeds. Dairy Low-fat or fat-free dairy products such as milk, yogurt, and cheese. The items listed above may not be a complete list of foods and beverages you can eat. Contact a dietitian for more information. What foods should I avoid? Fruits Fruits canned with  syrup. Vegetables Canned vegetables. Frozen vegetables with butter or cream sauce. Grains Refined white flour and flour products such as bread, pasta, snack foods, and cereals. Avoid all processed foods. Meats and other proteins Fatty cuts of meat. Poultry with skin. Breaded or fried meats. Processed meat. Avoid saturated fats. Dairy Full-fat yogurt, cheese, or milk. Beverages Sweetened drinks, such as soda or iced tea. The items listed above may not be a complete list of foods and beverages you should avoid. Contact a dietitian for more information. Questions to ask a health care provider  Do I need to meet with a diabetes educator?  Do I need to meet with a dietitian?  What number can I call if I have questions?  When are the best times to check my blood glucose? Where to find more information:  American Diabetes Association: diabetes.org  Academy of Nutrition and Dietetics: www.eatright.org  National Institute of Diabetes and Digestive and Kidney Diseases: www.niddk.nih.gov  Association of Diabetes Care and Education Specialists: www.diabeteseducator.org Summary  It is important to have healthy eating   habits because your blood sugar (glucose) levels are greatly affected by what you eat and drink.  A healthy meal plan will help you control your blood glucose and maintain a healthy lifestyle.  Your health care provider may recommend that you work with a dietitian to make a meal plan that is best for you.  Keep in mind that carbohydrates (carbs) and alcohol have immediate effects on your blood glucose levels. It is important to count carbs and to use alcohol carefully. This information is not intended to replace advice given to you by your health care provider. Make sure you discuss any questions you have with your health care provider. Document Revised: 01/01/2019 Document Reviewed: 01/01/2019 Elsevier Patient Education  2021 [employer]  

## 2020-11-12 LAB — LIPID PANEL W/O CHOL/HDL RATIO
Cholesterol, Total: 218 mg/dL — ABNORMAL HIGH (ref 100–199)
HDL: 49 mg/dL (ref >39)
LDL Chol Calc (NIH): 139 mg/dL — ABNORMAL HIGH (ref 0–99)
Triglycerides: 167 mg/dL — ABNORMAL HIGH (ref 0–149)
VLDL Cholesterol Cal: 30 mg/dL (ref 5–40)

## 2020-11-12 LAB — TSH: TSH: 2.76 u[IU]/mL (ref 0.450–4.500)

## 2020-11-12 LAB — VITAMIN D 25 HYDROXY (VIT D DEFICIENCY, FRACTURES): Vit D, 25-Hydroxy: 45.4 ng/mL (ref 30.0–100.0)

## 2020-11-12 MED ORDER — ATORVASTATIN CALCIUM 40 MG PO TABS: 40.0000 mg | ORAL_TABLET | Freq: Every day | ORAL | 4 refills | Status: DC

## 2020-11-12 NOTE — Progress Notes (Signed)
Contacted via [place] morning [facility], your labs have returned.  Vitamin D and thyroid labs are normal.  Cholesterol labs remain elevated, even with Atorvastatin 20 MG -- I would like to increase your Atorvastatin to 40 MG daily at this time to help with better control.  I will send this in and recommend you stop the 20 MG, if any questions or concerns let me know.   Keep being amazing!!  Thank you for allowing me to participate in your care.  I appreciate you. Kindest regards, Modine Oppenheimer

## 2020-11-12 NOTE — Addendum Note (Signed)
Addended by: Marnee Guarneri T on: 11/12/2020 09:46 AM   Modules accepted: Orders

## 2020-11-13 ENCOUNTER — Encounter: Payer: Self-pay | Admitting: General Surgery

## 2020-11-16 NOTE — Progress Notes (Signed)
PROVIDER NOTE: Information contained herein reflects review and annotations entered in association with encounter. Interpretation of such information and data should be left to medically-trained personnel. Information provided to patient can be located elsewhere in the medical record under "Patient Instructions". Document created using STT-dictation technology, any transcriptional errors that may result from process are unintentional.    Patient: Carolyn Roy  Service Category: E/M  Provider: Gaspar Cola, MD  DOB: [birth year]  DOS: 11/18/2020  Specialty: Interventional Pain Management  MRN: [id number]  Setting: Ambulatory outpatient  PCP: [name], NP  Type: Established Patient    Referring Provider: [name], NP  Location: Office  Delivery: Face-to-face     HPI  [name], a 55 y.o. year old female, is here today because of her Chronic pain of both feet [M79.671, G89.29, M79.672]. [name] primary complain today is Knee Pain (right) Last encounter: My last encounter with her was on 08/18/2020. Pertinent problems: [name] has Chronic fatigue; Cancer of midline of breast, left ([place]); Family history of breast cancer; Chemotherapy-induced neuropathy ([place]); Malignant neoplasm of lower-outer quadrant of left breast of female, estrogen receptor positive ([place]); Chronic feet pain (1ry area of Pain) (Bilateral) (R>L); Neuropathic pain of feet (Bilateral); Chronic knee pain (2ry area of Pain) (Bilateral) (R>L); Chronic hand pain (3ry area of Pain) (Bilateral) (R>L); Chronic pain syndrome; Chronic hip pain (4th area of Pain) (Bilateral) (R>L); Chronic upper extremity pain (3ry area of Pain) (Bilateral) (R>L); Cancer-related pain; Neuropathic pain; Osteoarthritis of knee (Right); Chronic low back pain (Bilateral (L>R) w/o sciatica; Chronic hip pain (Left); Lumbar facet syndrome (Bilateral) (L>R); Idiopathic scoliosis; Chronic sacroiliac joint pain (Left); Chronic pain of  knee (Right); Abnormal MRI, lumbar spine (04/20/2018); Lumbar facet arthropathy; Spondylosis without myelopathy or radiculopathy, lumbosacral region; DDD (degenerative disc disease), lumbosacral; Numbness and tingling of upper extremity (C6/C7 dermatomes) (Right); Cervicalgia; Cervical radiculitis (Right); Effusion of knee joint (Right); and Tricompartment osteoarthritis of knee (Right) on their pertinent problem list. Pain Assessment: Severity of Chronic pain is reported as a 8 /10. Location: Knee Right/denies. Onset: More than a month ago. Quality: Dull, Burning. Timing: Constant. Modifying factor(s): BenGay, Oxycodone. Vitals:  height is '5\' 1"'  (1.549 m) and weight is 170 lb (77.1 kg). Her temporal temperature is 96.8 F (36 C) (abnormal). Her blood pressure is 127/79 and her pulse is 91. Her respiration is 16 and oxygen saturation is 96%.   Reason for encounter: medication management.   The patient indicates doing well with the current medication regimen. No adverse reactions or side effects reported to the medications.   RTCB: 03/06/2021 Nonopioids transfer 02/03/2020: Pregabalin (Lyrica) and alpha-lipoic acid  Pharmacotherapy Assessment  Analgesic: Oxycodone IR 5 mg, 1 tab PO BID (10 mg/day of oxycodone) (Average: 2 tabs/day = 10 mg/day of oxycodone) MME/day: 15 mg/day.   Monitoring: [place] PMP: PDMP reviewed during this encounter.       Pharmacotherapy: No side-effects or adverse reactions reported. Compliance: No problems identified. Effectiveness: Clinically acceptable.  [name], RN  11/18/2020  1:49 PM  Sign when Signing Visit Nursing Pain Medication Assessment:  Safety precautions to be maintained throughout the outpatient stay will include: orient to surroundings, keep bed in low position, maintain call bell within reach at all times, provide assistance with transfer out of bed and ambulation.  Medication Inspection Compliance: Pill count conducted under aseptic conditions, in  front of the patient. Neither the pills nor the bottle was removed from the patient's sight at any  time. Once count was completed pills were immediately returned to the patient in their original bottle.  Medication: Oxycodone IR Pill/Patch Count:  37 of 60 pills remain Pill/Patch Appearance: Markings consistent with prescribed medication Bottle Appearance: Standard pharmacy container. Clearly labeled. Filled Date: 09 / 30 / 2022 Last Medication intake:  Today    UDS:  Summary  Date Value Ref Range Status  07/27/2020 Note  Final    Comment:    ==================================================================== [employer] (MW) ==================================================================== Test                             Result       Flag       Units  Drug Present and Declared for Prescription Verification   Oxycodone                      491          EXPECTED   ng/mg creat   Oxymorphone                    228          EXPECTED   ng/mg creat   Noroxycodone                   1816         EXPECTED   ng/mg creat   Noroxymorphone                 96           EXPECTED   ng/mg creat    Sources of oxycodone are scheduled prescription medications.    Oxymorphone, noroxycodone, and noroxymorphone are expected    metabolites of oxycodone. Oxymorphone is also available as a    scheduled prescription medication.  ==================================================================== Test                      Result    Flag   Units      Ref Range   Creatinine              57               mg/dL      >=20 ==================================================================== Declared Medications:  The flagging and interpretation on this report are based on the  following declared medications.  Unexpected results may arise from  inaccuracies in the declared medications.   **Note: The testing scope of this panel includes these medications:   Oxycodone (Roxicodone)   **Note: The  testing scope of this panel does not include the  following reported medications:   Acetaminophen (Excedrin)  Alpha Lipoic Acid (Alpha Lipoic Acid)  Aspirin (Excedrin)  Atorvastatin (Lipitor)  Caffeine (Excedrin)  Dulaglutide (Trulicity)  Duloxetine (Cymbalta)  Empagliflozin (Jardiance)  Goserelin  Insulin (Basaglar)  Pregabalin (Lyrica)  Sertraline (Zoloft)  Tamoxifen (Nolvadex)  Trazodone (Desyrel)  Vitamin D2 (Drisdol) ==================================================================== For clinical consultation, please call [phone]. ====================================================================      ROS  Constitutional: Denies any fever or chills Gastrointestinal: No reported hemesis, hematochezia, vomiting, or acute GI distress Musculoskeletal: Denies any acute onset joint swelling, redness, loss of ROM, or weakness Neurological: No reported episodes of acute onset apraxia, aphasia, dysarthria, agnosia, amnesia, paralysis, loss of coordination, or loss of consciousness  Medication Review  DULoxetine, Dulaglutide, Insulin Pen Needle, OneTouch Verio, Vitamin D (Ergocalciferol), aspirin-acetaminophen-caffeine, atorvastatin, empagliflozin, glucose blood, goserelin, lamoTRIgine, onetouch ultrasoft, oxyCODONE,  tamoxifen, and traZODone  History Review  Allergy: [name] is allergic to aspirin. Drug: [name]  reports no history of drug use. Alcohol:  reports no history of alcohol use. Tobacco:  reports that she has been smoking cigarettes. She has a 11.00 pack-year smoking history. She has quit using smokeless tobacco.  Her smokeless tobacco use included snuff. Social: [name]  reports that she has been smoking cigarettes. She has a 11.00 pack-year smoking history. She has quit using smokeless tobacco.  Her smokeless tobacco use included snuff. She reports that she does not drink alcohol and does not use drugs. Medical:  has a past medical history of Allergy,  Breast cancer ([place]), Cancer ([place]) (06/15/2017), Depression, Diabetes mellitus without complication ([place]) (1941), Edema of left upper extremity, Endometriosis, Family history of breast cancer, Headache, Hyperlipidemia, Lymphedema of left arm, Ovarian mass, Personal history of chemotherapy, Personal history of radiation therapy, and Pneumonia. Surgical: [name]  has a past surgical history that includes Oophorectomy; Tubal ligation; Portacath placement (Right, 07/14/2017); Axillary lymph node biopsy (Left, 07/14/2017); Breast lumpectomy (Left, 01/12/2018); Breast biopsy (Left); Partial mastectomy with needle localization (Left, 01/12/2018); Sentinel node biopsy (Left, 01/12/2018); and Colonoscopy with propofol (N/A, 12/27/2019). Family: family history includes Breast cancer in an other family member; Breast cancer (age of onset: 81) in her maternal grandmother; Breast cancer (age of onset: 40) in an other family member; Breast cancer (age of onset: 40) in her maternal aunt; Cancer in her mother; Colon cancer in her maternal grandmother and mother; Diabetes in her brother; Other in her father; Pancreatitis in her brother; Prostate cancer (age of onset: 6) in her brother.  Laboratory Chemistry Profile   Renal Lab Results  Component Value Date   BUN 12 10/21/2020   CREATININE 0.88 10/21/2020   BCR 19 10/18/2019   GFRAA >60 10/21/2019   GFRNONAA >60 10/21/2020    Hepatic Lab Results  Component Value Date   AST 15 10/21/2020   ALT 16 10/21/2020   ALBUMIN 3.6 10/21/2020   ALKPHOS 57 10/21/2020    Electrolytes Lab Results  Component Value Date   NA 140 10/21/2020   K 4.4 10/21/2020   CL 104 10/21/2020   CALCIUM 9.0 10/21/2020   MG 1.9 11/23/2017    Bone Lab Results  Component Value Date   VD25OH 45.4 11/11/2020    Inflammation (CRP: Acute Phase) (ESR: Chronic Phase) Lab Results  Component Value Date   CRP 7 01/22/2018   ESRSEDRATE 27 01/22/2018         Note: Above Lab results  reviewed.  Recent Imaging Review  MM 3D SCREEN BREAST BILATERAL CLINICAL DATA:  Screening.  EXAM: DIGITAL SCREENING BILATERAL MAMMOGRAM WITH TOMOSYNTHESIS AND CAD  TECHNIQUE: Bilateral screening digital craniocaudal and mediolateral oblique mammograms were obtained. Bilateral screening digital breast tomosynthesis was performed. The images were evaluated with computer-aided detection.  COMPARISON:  Previous exam(s).  ACR Breast Density Category b: There are scattered areas of fibroglandular density.  FINDINGS: There are no findings suspicious for malignancy.  IMPRESSION: No mammographic evidence of malignancy. A result letter of this screening mammogram will be mailed directly to the patient.  RECOMMENDATION: Screening mammogram in one year. (Code:SM-B-01Y)  BI-RADS CATEGORY  1: Negative.  Electronically Signed   By: Nolon Nations M.D.   On: 09/11/2020 13:16 Note: Reviewed        Physical Exam  General appearance: Well nourished, well developed, and well hydrated. In no apparent acute distress Mental status: Alert, oriented x 3 (person,  place, & time)       Respiratory: No evidence of acute respiratory distress Eyes: PERLA Vitals: BP 127/79 (BP Location: Left Arm, Patient Position: Sitting, Cuff Size: Normal)   Pulse 91   Temp (!) 96.8 F (36 C) (Temporal)   Resp 16   Ht '5\' 1"'  (1.549 m)   Wt 170 lb (77.1 kg)   LMP  (LMP Unknown) Comment: LAST PERIOD IN MAY 2019 WHEN SHE STARTED CHEMO  SpO2 96%   BMI 32.12 kg/m  BMI: Estimated body mass index is 32.12 kg/m as calculated from the following:   Height as of this encounter: '5\' 1"'  (1.549 m).   Weight as of this encounter: 170 lb (77.1 kg). Ideal: Ideal body weight: 47.8 kg (105 lb 6.1 oz) Adjusted ideal body weight: 59.5 kg (131 lb 3.6 oz)  Assessment   Status Diagnosis  Controlled Controlled Controlled 1. Chronic feet pain (1ry area of Pain) (Bilateral) (R>L)   2. Chronic knee pain (2ry area of Pain)  (Bilateral) (R>L)   3. Chronic upper extremity pain (3ry area of Pain) (Bilateral) (R>L)   4. Chronic hand pain (3ry area of Pain) (Bilateral) (R>L)   5. Chronic hip pain (4th area of Pain) (Bilateral) (R>L)   6. Chronic pain syndrome   7. Cancer-related pain   8. Pharmacologic therapy   9. Chronic use of opiate for therapeutic purpose      Updated Problems: No problems updated.  Plan of Care  Problem-specific:  No problem-specific Assessment & Plan notes found for this encounter.  Ms. AVANTHIKA DEHNERT has a current medication list which includes the following long-term medication(s): atorvastatin, lamotrigine, [START ON 12/06/2020] oxycodone, [START ON 01/05/2021] oxycodone, and [START ON 02/04/2021] oxycodone.  Pharmacotherapy (Medications Ordered): Meds ordered this encounter  Medications   oxyCODONE (OXY IR/ROXICODONE) 5 MG immediate release tablet    Sig: Take 1 tablet (5 mg total) by mouth 2 (two) times daily as needed for severe pain. Must last 30 days    Dispense:  60 tablet    Refill:  0    DO NOT: delete (not duplicate); no partial-fill (will deny script to complete), no refill request (F/U required). DISPENSE: 1 day early if closed on fill date. WARN: No CNS-depressants within 8 hrs of med.   oxyCODONE (OXY IR/ROXICODONE) 5 MG immediate release tablet    Sig: Take 1 tablet (5 mg total) by mouth 2 (two) times daily as needed for severe pain. Must last 30 days    Dispense:  60 tablet    Refill:  0    DO NOT: delete (not duplicate); no partial-fill (will deny script to complete), no refill request (F/U required). DISPENSE: 1 day early if closed on fill date. WARN: No CNS-depressants within 8 hrs of med.   oxyCODONE (OXY IR/ROXICODONE) 5 MG immediate release tablet    Sig: Take 1 tablet (5 mg total) by mouth 2 (two) times daily as needed for severe pain. Must last 30 days    Dispense:  60 tablet    Refill:  0    DO NOT: delete (not duplicate); no partial-fill (will deny  script to complete), no refill request (F/U required). DISPENSE: 1 day early if closed on fill date. WARN: No CNS-depressants within 8 hrs of med.    Orders:  No orders of the defined types were placed in this encounter.  Follow-up plan:   Return in about 4 months (around 03/06/2021) for Eval-day (M,W), (F2F), (MM).     Interventional  Therapies  Risk  Complexity Considerations:   Estimated body mass index is 30.55 kg/m as calculated from the following:   Height as of this encounter: '5\' 4"'  (1.626 m).   Weight as of this encounter: 178 lb (80.7 kg). WNL   Planned  Pending:   Pending further evaluation   Under consideration:   Possible bilateral lumbar facet RFA  Diagnostic lumbar sympathetic block  Diagnostic right genicular NB  Possible right genicular nerve RFA    Completed:   Therapeutic right IA Hyalgan knee inj. x2 (steroids) (12/20/18)  Diagnostic bilateral lumbar facet block x2 (08/16/18)  Therapeutic/palliative right knee Hyalgan inj. x5 (06/25/19)    Therapeutic  Palliative (PRN) options:   Therapeutic right IA knee joint injection #3 (steroids)  Diagnostic bilateral lumbar facet block #3  Therapeutic/palliative right knee Hyalgan inj. S2/N1     Recent Visits Date Type Provider Dept  09/01/20 Telemedicine Milinda Pointer, MD [facility]  Showing recent visits within past 90 days and meeting all other requirements Today's Visits Date Type Provider Dept  11/18/20 Office Visit Milinda Pointer, MD [facility]  Showing today's visits and meeting all other requirements Future Appointments No visits were found meeting these conditions. Showing future appointments within next 90 days and meeting all other requirements I discussed the assessment and treatment plan with the patient. The patient was provided an opportunity to ask questions and all were answered. The patient agreed with the plan and demonstrated an understanding of the  instructions.  Patient advised to call back or seek an in-person evaluation if the symptoms or condition worsens.  Duration of encounter: 30 minutes.  Note by: Gaspar Cola, MD Date: 11/18/2020; Time: 2:19 PM

## 2020-11-18 ENCOUNTER — Encounter: Payer: Self-pay | Admitting: Pain Medicine

## 2020-11-18 ENCOUNTER — Other Ambulatory Visit: Payer: Self-pay

## 2020-11-18 ENCOUNTER — Ambulatory Visit: Payer: No Typology Code available for payment source | Attending: Pain Medicine | Admitting: Pain Medicine

## 2020-11-18 VITALS — BP 127/79 | HR 91 | Temp 96.8°F | Resp 16 | Ht 61.0 in | Wt 170.0 lb

## 2020-11-18 DIAGNOSIS — Z79899 Other long term (current) drug therapy: Secondary | ICD-10-CM | POA: Diagnosis present

## 2020-11-18 DIAGNOSIS — M25551 Pain in right hip: Secondary | ICD-10-CM | POA: Diagnosis present

## 2020-11-18 DIAGNOSIS — M79672 Pain in left foot: Secondary | ICD-10-CM | POA: Insufficient documentation

## 2020-11-18 DIAGNOSIS — M25552 Pain in left hip: Secondary | ICD-10-CM

## 2020-11-18 DIAGNOSIS — G894 Chronic pain syndrome: Secondary | ICD-10-CM | POA: Diagnosis present

## 2020-11-18 DIAGNOSIS — M25562 Pain in left knee: Secondary | ICD-10-CM | POA: Insufficient documentation

## 2020-11-18 DIAGNOSIS — G893 Neoplasm related pain (acute) (chronic): Secondary | ICD-10-CM

## 2020-11-18 DIAGNOSIS — M79602 Pain in left arm: Secondary | ICD-10-CM | POA: Diagnosis present

## 2020-11-18 DIAGNOSIS — M79642 Pain in left hand: Secondary | ICD-10-CM | POA: Diagnosis present

## 2020-11-18 DIAGNOSIS — M79671 Pain in right foot: Secondary | ICD-10-CM | POA: Diagnosis not present

## 2020-11-18 DIAGNOSIS — M79601 Pain in right arm: Secondary | ICD-10-CM | POA: Diagnosis not present

## 2020-11-18 DIAGNOSIS — M79641 Pain in right hand: Secondary | ICD-10-CM | POA: Diagnosis not present

## 2020-11-18 DIAGNOSIS — M25561 Pain in right knee: Secondary | ICD-10-CM

## 2020-11-18 DIAGNOSIS — Z79891 Long term (current) use of opiate analgesic: Secondary | ICD-10-CM | POA: Insufficient documentation

## 2020-11-18 DIAGNOSIS — G8929 Other chronic pain: Secondary | ICD-10-CM | POA: Diagnosis present

## 2020-11-18 MED ORDER — OXYCODONE HCL 5 MG PO TABS: 5.0000 mg | ORAL_TABLET | Freq: Two times a day (BID) | ORAL | 0 refills | Status: DC | PRN

## 2020-11-18 NOTE — Progress Notes (Signed)
Nursing Pain Medication Assessment:  Safety precautions to be maintained throughout the outpatient stay will include: orient to surroundings, keep bed in low position, maintain call bell within reach at all times, provide assistance with transfer out of bed and ambulation.  Medication Inspection Compliance: Pill count conducted under aseptic conditions, in front of the patient. Neither the pills nor the bottle was removed from the patient's sight at any time. Once count was completed pills were immediately returned to the patient in their original bottle.  Medication: Oxycodone IR Pill/Patch Count:  37 of 60 pills remain Pill/Patch Appearance: Markings consistent with prescribed medication Bottle Appearance: Standard pharmacy container. Clearly labeled. Filled Date: 09 / 30 / 2022 Last Medication intake:  Today

## 2020-11-18 NOTE — Patient Instructions (Signed)

## 2020-12-01 ENCOUNTER — Emergency Department: Payer: No Typology Code available for payment source

## 2020-12-01 ENCOUNTER — Telehealth: Payer: Self-pay | Admitting: *Deleted

## 2020-12-01 ENCOUNTER — Encounter: Payer: Self-pay | Admitting: Internal Medicine

## 2020-12-01 ENCOUNTER — Other Ambulatory Visit: Payer: Self-pay

## 2020-12-01 DIAGNOSIS — R1310 Dysphagia, unspecified: Secondary | ICD-10-CM | POA: Diagnosis not present

## 2020-12-01 DIAGNOSIS — Z5321 Procedure and treatment not carried out due to patient leaving prior to being seen by health care provider: Secondary | ICD-10-CM | POA: Insufficient documentation

## 2020-12-01 DIAGNOSIS — R1010 Upper abdominal pain, unspecified: Secondary | ICD-10-CM | POA: Insufficient documentation

## 2020-12-01 DIAGNOSIS — R1110 Vomiting, unspecified: Secondary | ICD-10-CM | POA: Diagnosis present

## 2020-12-01 LAB — COMPREHENSIVE METABOLIC PANEL
ALT: 17 U/L (ref 0–44)
AST: 17 U/L (ref 15–41)
Albumin: 4.1 g/dL (ref 3.5–5.0)
Alkaline Phosphatase: 60 U/L (ref 38–126)
Anion gap: 7 (ref 5–15)
BUN: 12 mg/dL (ref 6–20)
CO2: 27 mmol/L (ref 22–32)
Calcium: 9.3 mg/dL (ref 8.9–10.3)
Chloride: 105 mmol/L (ref 98–111)
Creatinine, Ser: 0.59 mg/dL (ref 0.44–1.00)
GFR, Estimated: >60 mL/min (ref >60)
Glucose, Bld: 264 mg/dL — ABNORMAL HIGH (ref 70–99)
Potassium: 4.1 mmol/L (ref 3.5–5.1)
Sodium: 139 mmol/L (ref 135–145)
Total Bilirubin: 0.7 mg/dL (ref 0.3–1.2)
Total Protein: 7.5 g/dL (ref 6.5–8.1)

## 2020-12-01 LAB — CBC WITH DIFFERENTIAL/PLATELET
Abs Immature Granulocytes: 0.02 10*3/uL (ref 0.00–0.07)
Basophils Absolute: 0 10*3/uL (ref 0.0–0.1)
Basophils Relative: 0 %
Eosinophils Absolute: 0 10*3/uL (ref 0.0–0.5)
Eosinophils Relative: 0 %
HCT: 48.5 % — ABNORMAL HIGH (ref 36.0–46.0)
Hemoglobin: 16.5 g/dL — ABNORMAL HIGH (ref 12.0–15.0)
Immature Granulocytes: 0 %
Lymphocytes Relative: 16 %
Lymphs Abs: 1.6 10*3/uL (ref 0.7–4.0)
MCH: 32.5 pg (ref 26.0–34.0)
MCHC: 34 g/dL (ref 30.0–36.0)
MCV: 95.7 fL (ref 80.0–100.0)
Monocytes Absolute: 0.4 10*3/uL (ref 0.1–1.0)
Monocytes Relative: 4 %
Neutro Abs: 8.1 10*3/uL — ABNORMAL HIGH (ref 1.7–7.7)
Neutrophils Relative %: 80 %
Platelets: 236 10*3/uL (ref 150–400)
RBC: 5.07 MIL/uL (ref 3.87–5.11)
RDW: 13 % (ref 11.5–15.5)
WBC: 10.2 10*3/uL (ref 4.0–10.5)
nRBC: 0 % (ref 0.0–0.2)

## 2020-12-01 LAB — LIPASE, BLOOD: Lipase: 31 U/L (ref 11–51)

## 2020-12-01 IMAGING — CR DG ABDOMEN ACUTE W/ 1V CHEST
4 series · 4 of 4 positions shown · non-contrast
Comparison: None.

CLINICAL DATA: Dysphagia HASON

EXAM:
DG ABDOMEN ACUTE WITH 1 VIEW CHEST

[chest pa]
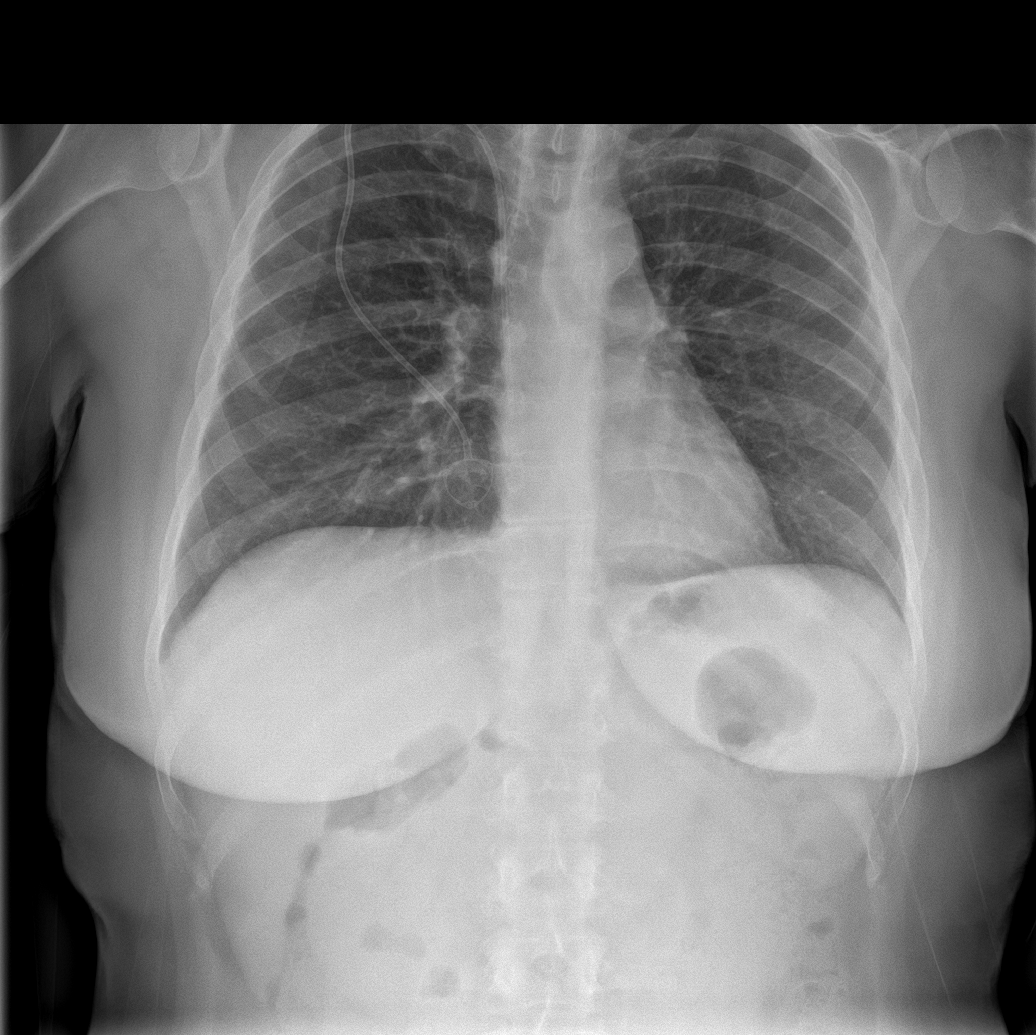

[abdomen erect (1 of 2)]
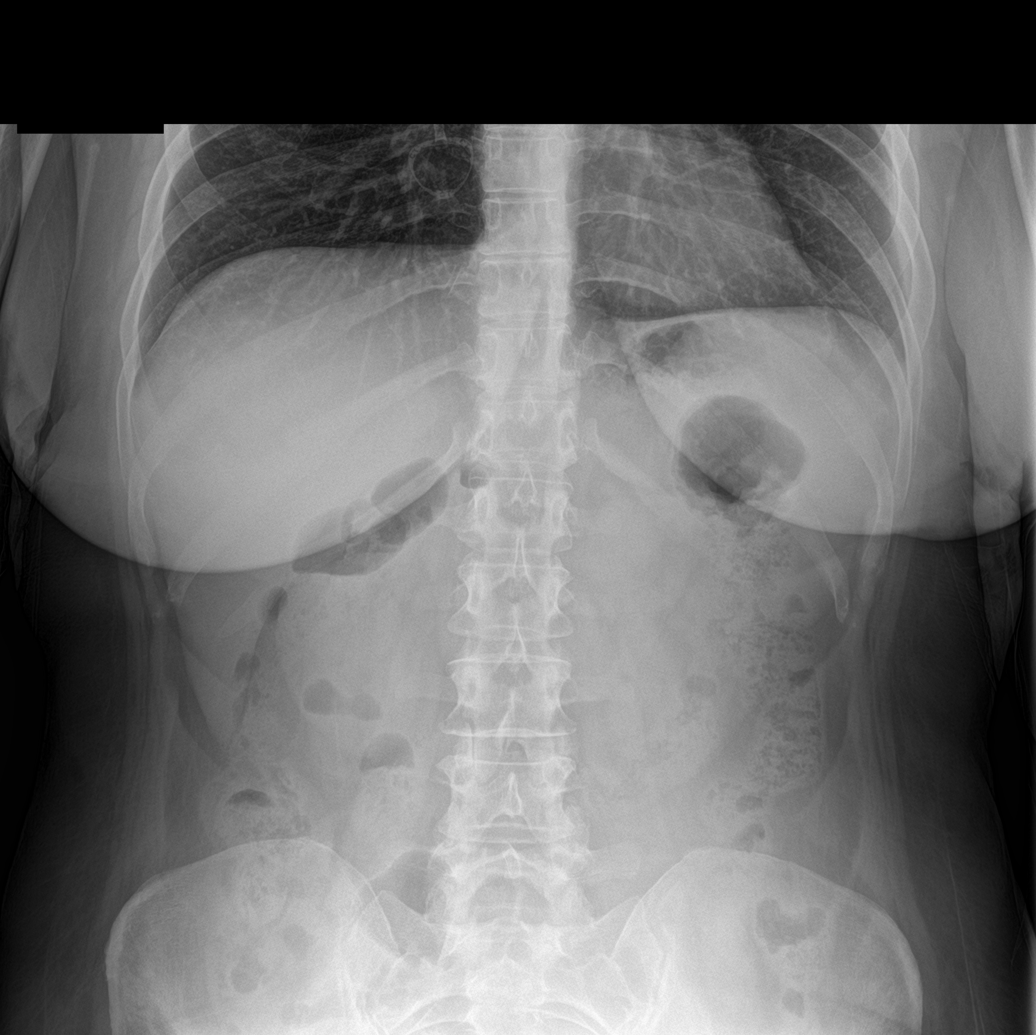

[abdomen erect (2 of 2)]
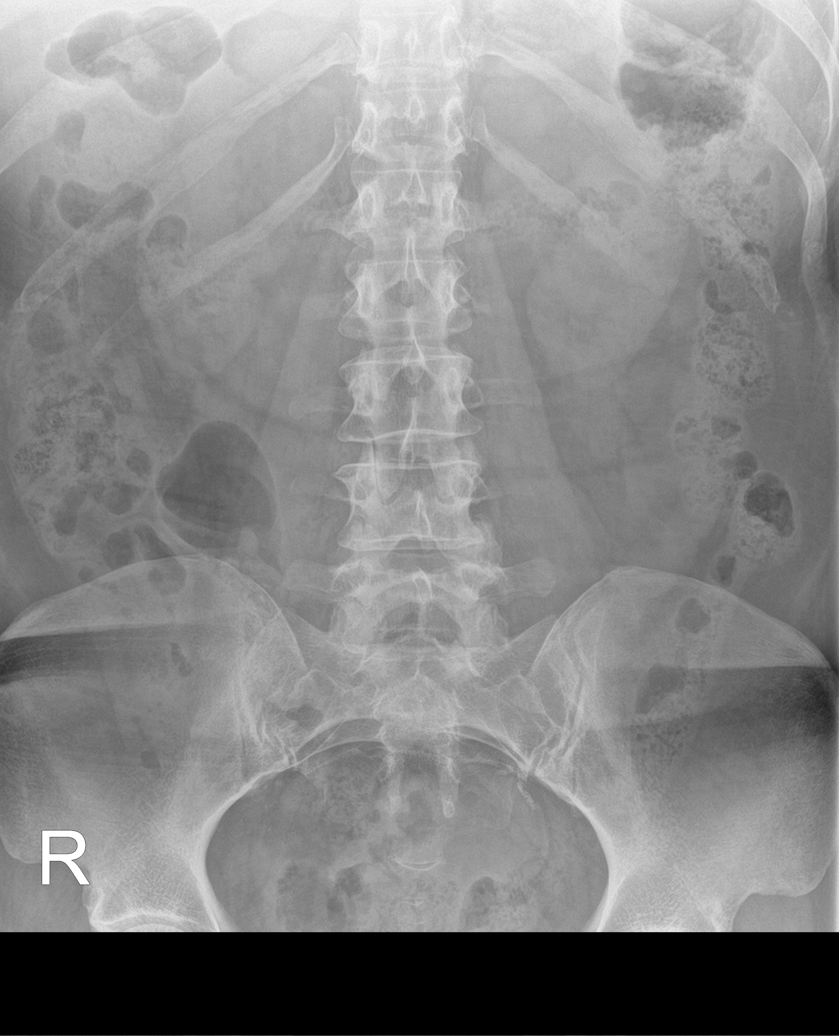

[abdomen supine]
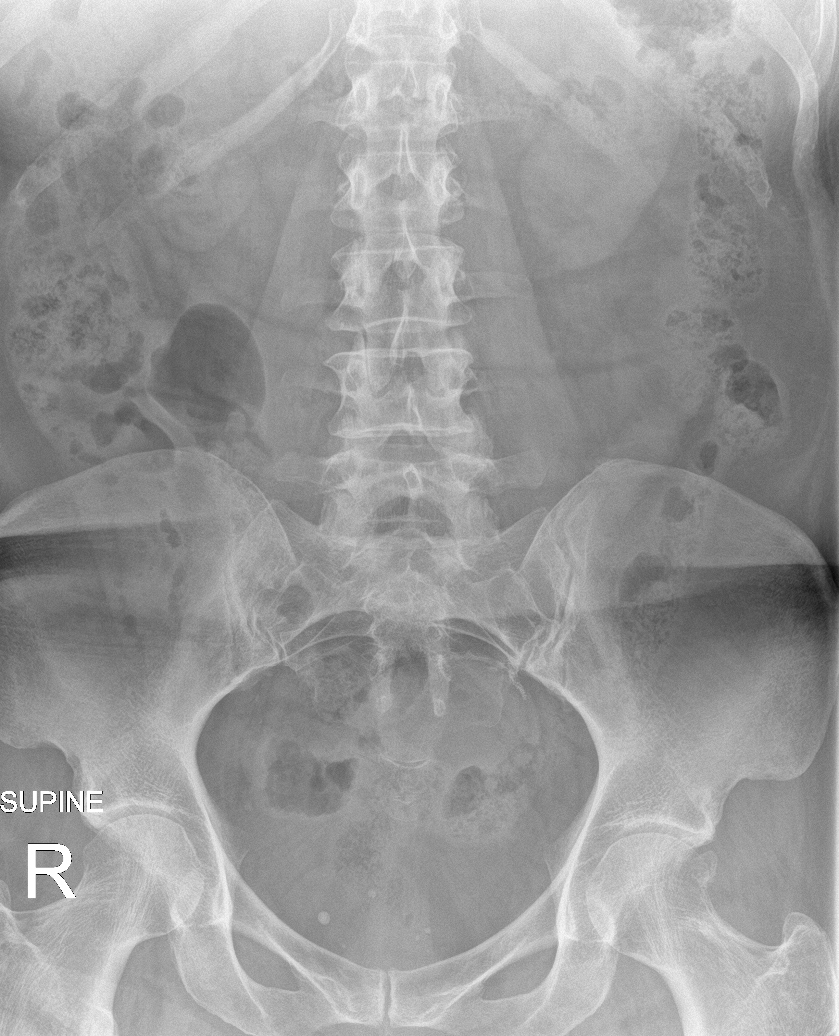

[4 of 4 positions shown; findings below may reference images not displayed]

FINDINGS: Right Port-A-Cath in place with the tip at the cavoatrial junction.
Lungs clear. Heart is normal size. No effusions.

There is normal bowel gas pattern. No free air. No organomegaly or
suspicious calcification. No acute bony abnormality.
IMPRESSION: Negative abdominal radiographs.  No acute cardiopulmonary disease.

## 2020-12-01 MED ORDER — ONDANSETRON 4 MG PO TBDP
4.0000 mg | ORAL_TABLET | Freq: Once | ORAL | Status: AC
  Administered 2020-12-01: 4 mg via ORAL
  Filled 2020-12-01: qty 1

## 2020-12-01 NOTE — ED Triage Notes (Signed)
Pt states that a month ago she started to vomit and get migraines. Pt states over the last several days it has gotten worse and is unable to keep anything down.  Pt states that her PCP ordered a chest x-ray but was unable to get it. ER provider in triage room.  Pt states history of stage 3 breast cancer, but in remission for 3 years.

## 2020-12-01 NOTE — Telephone Encounter (Signed)
Pt contacted and message left that records indicate pt was a "no show" for   dg esophagogram on 10/23/2020.  RN requested pt call clinic and verify this is the "xray" she is referring to not being notified of appointment.

## 2020-12-01 NOTE — ED Provider Notes (Signed)
Emergency Medicine Provider Triage Evaluation Note  Carolyn Roy , a 55 y.o. female  was evaluated in triage.  Pt complains of difficulty swallowing and frequently vomiting after meals worsening over about 2 months time.  Patient has seen her oncologist and was told that she needed a x-ray to evaluate to see if she might have a sort of narrowing that is causing her blocking her esophagus.  She tried to have this done but could not make it to the appointment due to work.  Presents today hoping she might be able to get an x-ray.  Review of Systems  Positive: Occasional vomiting sometimes hours after eating.  Occasional pain in the mid upper abdomen.  Worsening slowly over about the last 2 months time.  She reports when she drinks liquids no problems staying hydrated, but if she did eat solid foods tends to feel like it gets stuck and sometimes has to throw it up. Negative: Fevers chills or weakness.  Physical Exam  BP 122/82   Pulse 99   Temp 97.9 F (36.6 C)   Resp 18   Ht 5\' 4"  (1.626 m)   Wt 77.1 kg   LMP  (LMP Unknown) Comment: LAST PERIOD IN MAY 2019 WHEN SHE STARTED CHEMO  SpO2 96%   BMI 29.18 kg/m  Gen:   Awake, no distress   Resp:  Normal effort  MSK:   Moves extremities without difficulty  Other:  No active vomiting.  Swallowing secretions without difficulty.  Medical Decision Making  Medically screening exam initiated at 6:34 PM.  Appropriate orders placed.  Carolyn Roy was informed that the remainder of the evaluation will be completed by another provider, this initial triage assessment does not replace that evaluation, and the importance of remaining in the ED until their evaluation is complete.  Discussed with the patient, and it appears her doctor had wanted her to have a esophagram.  I called and spoke with our radiology department, however radiology department does not currently have radiologist in-house to perform an esophagram.  Discussed this with the  patient, and she is understanding and willing to wait for further evaluation in ER, obtain labs in ER, etc.     Delman Kitten, MD 12/01/20 [phone]

## 2020-12-02 ENCOUNTER — Emergency Department: Payer: No Typology Code available for payment source

## 2020-12-02 ENCOUNTER — Emergency Department
Admission: EM | Admit: 2020-12-02 | Discharge: 2020-12-02 | Discharge status: Against Medical Advice | Payer: No Typology Code available for payment source | Attending: Emergency Medicine | Admitting: Emergency Medicine

## 2020-12-02 DIAGNOSIS — R1110 Vomiting, unspecified: Secondary | ICD-10-CM

## 2020-12-02 MED ORDER — LACTATED RINGERS IV BOLUS: 1000.0000 mL | Freq: Once | INTRAVENOUS | Status: DC

## 2020-12-02 MED ORDER — METOCLOPRAMIDE HCL 5 MG/ML IJ SOLN: 10.0000 mg | Freq: Once | INTRAMUSCULAR | Status: DC

## 2020-12-02 NOTE — ED Notes (Signed)
No answer when called several times from lobby 

## 2020-12-02 NOTE — Telephone Encounter (Signed)
Lmom asking pt to call back to schedule an appt. °

## 2020-12-03 ENCOUNTER — Encounter: Payer: Self-pay | Admitting: Radiation Oncology

## 2020-12-03 ENCOUNTER — Other Ambulatory Visit: Payer: Self-pay

## 2020-12-03 ENCOUNTER — Ambulatory Visit
Admission: RE | Admit: 2020-12-03 | Discharge: 2020-12-03 | Disposition: A | Discharge status: Home / Self Care | Payer: No Typology Code available for payment source | Source: Ambulatory Visit | Attending: Radiation Oncology | Admitting: Radiation Oncology

## 2020-12-03 VITALS — BP 112/77 | HR 89 | Temp 97.6°F | Resp 16 | Wt 167.0 lb

## 2020-12-03 DIAGNOSIS — Z170 Estrogen receptor positive status [ER+]: Secondary | ICD-10-CM | POA: Insufficient documentation

## 2020-12-03 DIAGNOSIS — C50512 Malignant neoplasm of lower-outer quadrant of left female breast: Secondary | ICD-10-CM | POA: Insufficient documentation

## 2020-12-03 DIAGNOSIS — Z923 Personal history of irradiation: Secondary | ICD-10-CM | POA: Diagnosis not present

## 2020-12-03 DIAGNOSIS — Z79810 Long term (current) use of selective estrogen receptor modulators (SERMs): Secondary | ICD-10-CM | POA: Diagnosis not present

## 2020-12-03 NOTE — Progress Notes (Signed)
Radiation Oncology Follow up Note  Name: Carolyn Roy   Date:   12/03/2020 MRN:  [id number] DOB: 1965-10-20    This 55 y.o. 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  She specifically denies breast tenderness cough or bone pain..  She has been having some nausea of unknown etiology CT scan was negative.  She is a swallowing study ordered.  She had mammograms back in August which were BI-RADS 1 negative.  I reviewed viewed both series of images.  She is currently tamoxifen without side effect.  COMPLICATIONS OF TREATMENT: none  FOLLOW UP COMPLIANCE: keeps appointments   PHYSICAL EXAM:  BP 112/77   Pulse 89   Temp 97.6 F (36.4 C) (Tympanic)   Resp 16   Wt 167 lb (75.8 kg)   LMP  (LMP Unknown) Comment: LAST PERIOD IN MAY 2019 WHEN SHE STARTED CHEMO  BMI 28.67 kg/m  Lungs are clear to A&P cardiac examination essentially unremarkable with regular rate and rhythm. No dominant mass or nodularity is noted in either breast in 2 positions examined. Incision is well-healed. No axillary or supraclavicular adenopathy is appreciated. Cosmetic result is excellent.  Well-developed well-nourished patient in NAD. HEENT reveals PERLA, EOMI, discs not visualized.  Oral cavity is clear. No oral mucosal lesions are identified. Neck is clear without evidence of cervical or supraclavicular adenopathy. Lungs are clear to A&P. Cardiac examination is essentially unremarkable with regular rate and rhythm without murmur rub or thrill. Abdomen is benign with no organomegaly or masses noted. Motor sensory and DTR levels are equal and symmetric  in the upper and lower extremities. Cranial nerves II through XII are grossly intact. Proprioception is intact. No peripheral adenopathy or edema is identified. No motor or sensory levels are noted. Crude visual fields are within normal range.  RADIOLOGY RESULTS: CT scans as well as mammograms reviewed compatible with above-stated findings  PLAN: Present time she is now out 2 and half years with no evidence of disease.  She is on narcotics for pain which may have something to do with her nausea.  She will have follow-up studies performed.  I will see her back in 1 year for follow-up.  She continues on tamoxifen without side effect.  Patient knows to call with any concerns.  I would like to take this opportunity to thank you for allowing me to participate in the care of your patient.Noreene Filbert, MD

## 2020-12-05 ENCOUNTER — Other Ambulatory Visit: Payer: Self-pay | Admitting: Internal Medicine

## 2020-12-07 ENCOUNTER — Encounter: Payer: Self-pay | Admitting: Internal Medicine

## 2020-12-09 ENCOUNTER — Other Ambulatory Visit: Payer: Self-pay

## 2020-12-09 ENCOUNTER — Ambulatory Visit
Admission: RE | Admit: 2020-12-09 | Discharge: 2020-12-09 | Disposition: A | Discharge status: Home / Self Care | Payer: No Typology Code available for payment source | Source: Ambulatory Visit | Attending: Internal Medicine | Admitting: Internal Medicine

## 2020-12-09 ENCOUNTER — Other Ambulatory Visit: Payer: Self-pay | Admitting: Internal Medicine

## 2020-12-09 DIAGNOSIS — Z170 Estrogen receptor positive status [ER+]: Secondary | ICD-10-CM | POA: Insufficient documentation

## 2020-12-09 DIAGNOSIS — C50512 Malignant neoplasm of lower-outer quadrant of left female breast: Secondary | ICD-10-CM | POA: Diagnosis not present

## 2020-12-09 DIAGNOSIS — R1310 Dysphagia, unspecified: Secondary | ICD-10-CM | POA: Diagnosis present

## 2020-12-09 IMAGING — RF DG ESOPHAGUS
7 series · 14 of 24 positions shown · non-contrast
Comparison: None.

CLINICAL DATA: Dysphagia. History of solids and liquids getting
stuck. History of breast cancer.

EXAM:
ESOPHOGRAM / BARIUM SWALLOW / BARIUM TABLET STUDY
TECHNIQUE: Combined double contrast and single contrast examination performed
using effervescent crystals, thick barium liquid, and thin barium
liquid. The patient was observed with fluoroscopy swallowing a 13 mm
barium sulphate tablet.
FLUOROSCOPY TIME:  Fluoroscopy Time:  2.3 minute
Radiation Exposure Index (if provided by the fluoroscopic device):
27.7 mGy
Number of Acquired Spot Images: 0

[Series 1: cp_standard · 0.17mm/px · 1 of 1 slices shown (1 of 7)]
[im 1/1]
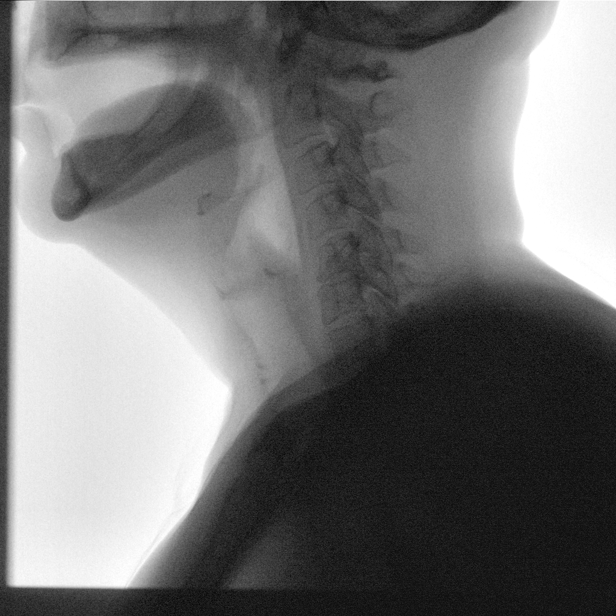

[Series 2: cp_standard · 0.17mm/px · 2 of 52 frames shown (2 of 7)]
[frame 27/52]
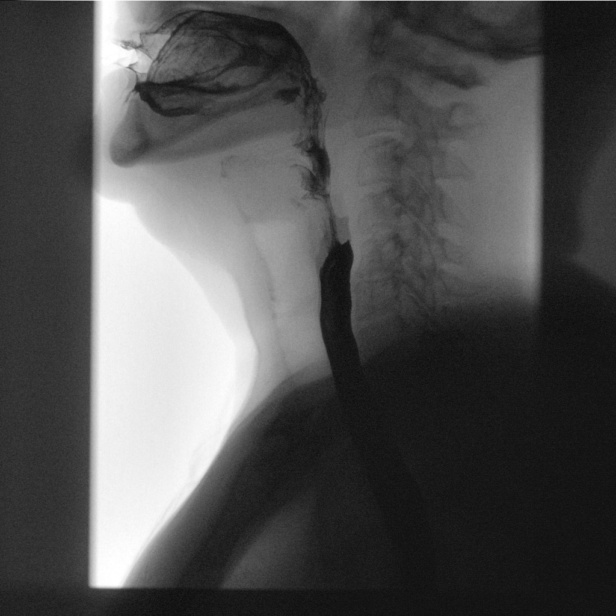
[frame 47/52]
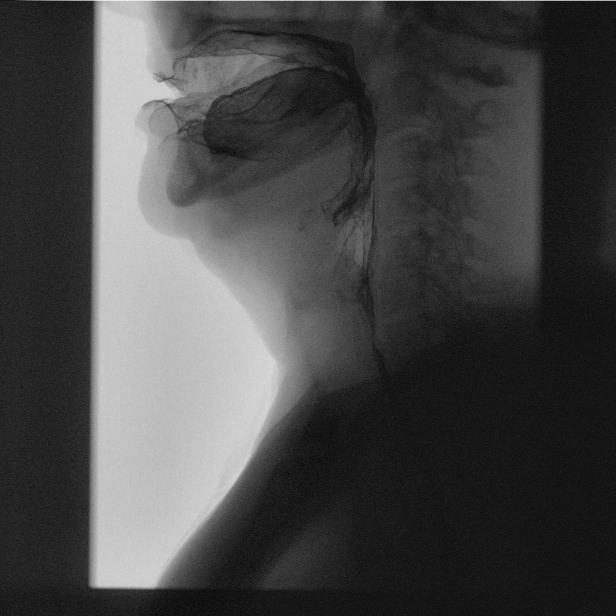

[Series 3: cp_standard · 0.17mm/px · 2 of 74 frames shown (3 of 7)]
[frame 38/74]
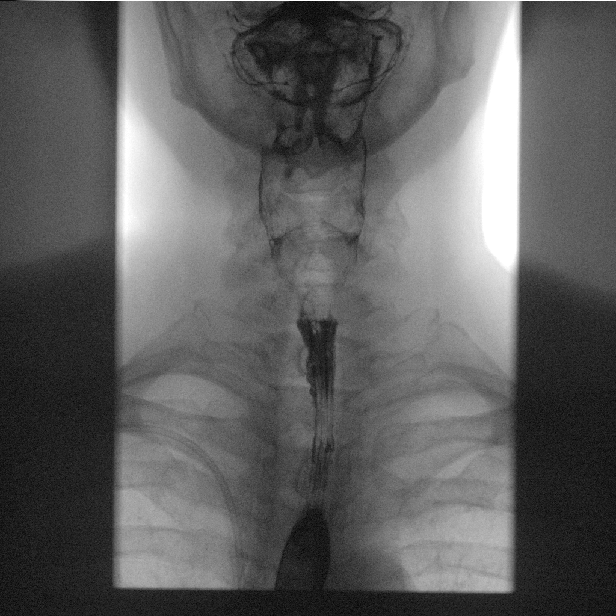
[frame 55/74]
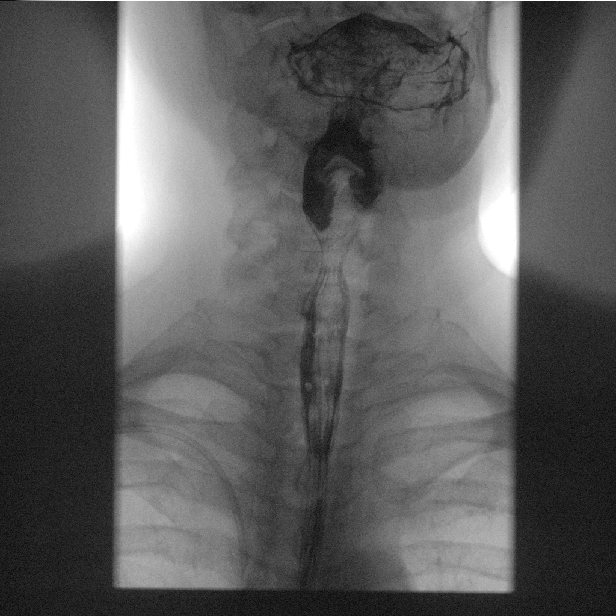

[Series 4: cp_standard · 0.26mm/px · 2 of 98 frames shown (4 of 7)]
[frame 15/98]
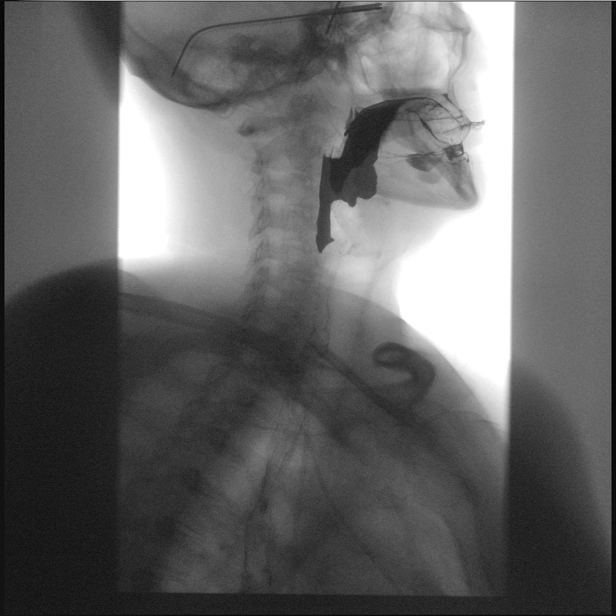
[frame 51/98]
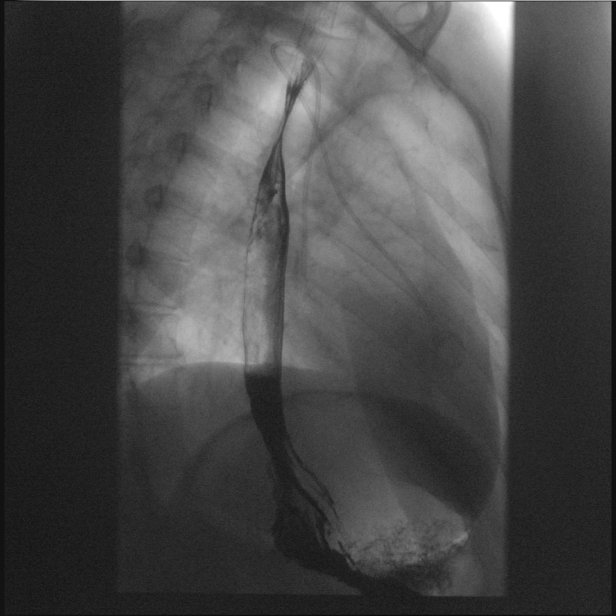

[Series 5: cp_standard · 0.26mm/px · 2 of 144 frames shown (5 of 7)]
[frame 22/144]
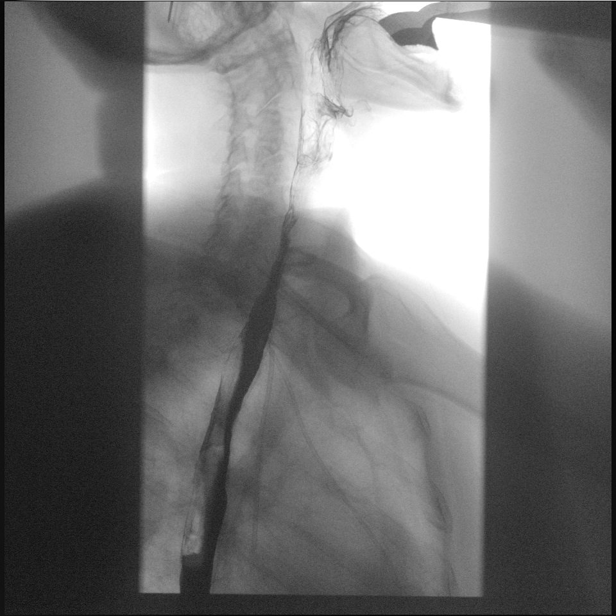
[frame 123/144]
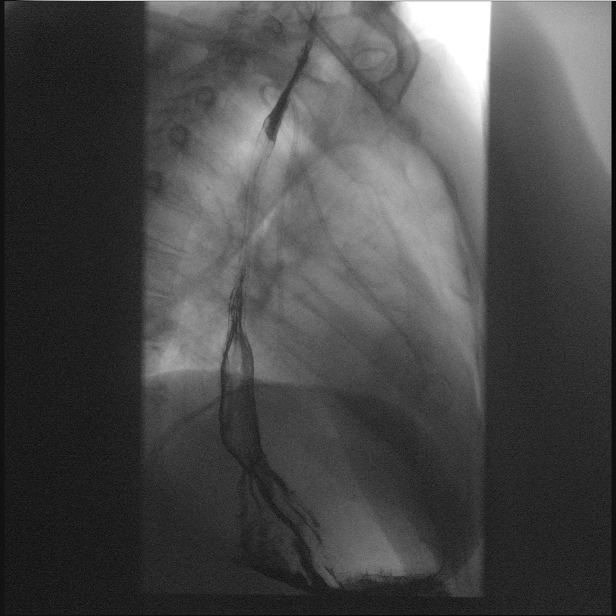

[Series 6: cp_standard · 0.26mm/px · 3 of 62 frames shown (6 of 7)]
[frame 1/62]
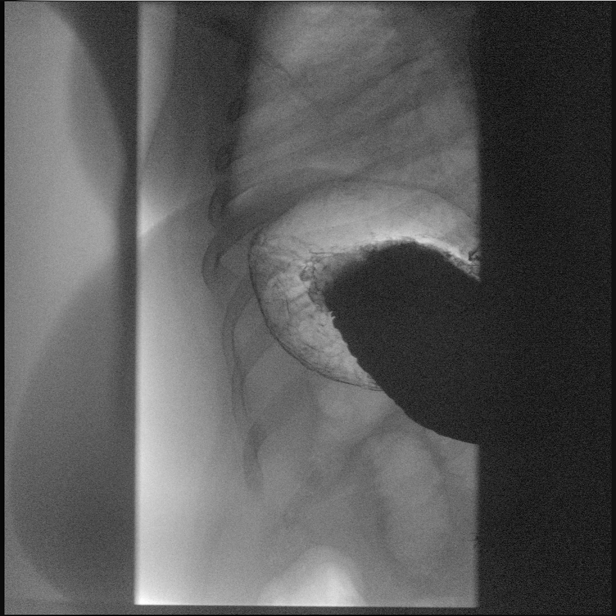
[frame 32/62]
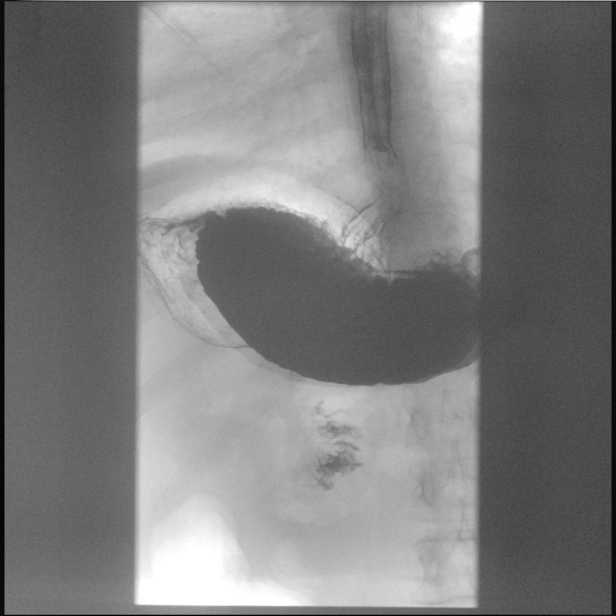
[frame 53/62]
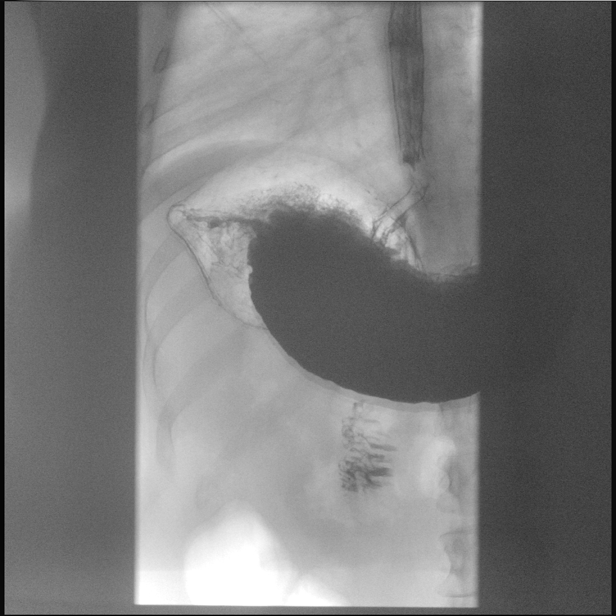

[Series 7: cp_standard · 0.26mm/px · 2 of 120 frames shown (7 of 7)]
[frame 61/120]
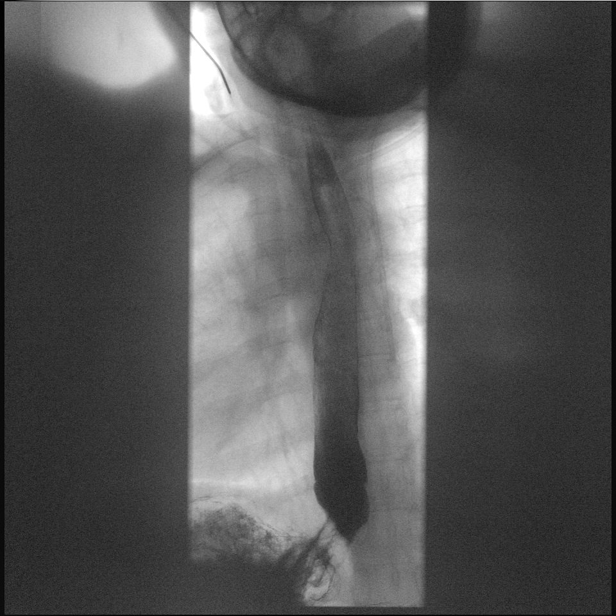
[frame 106/120]
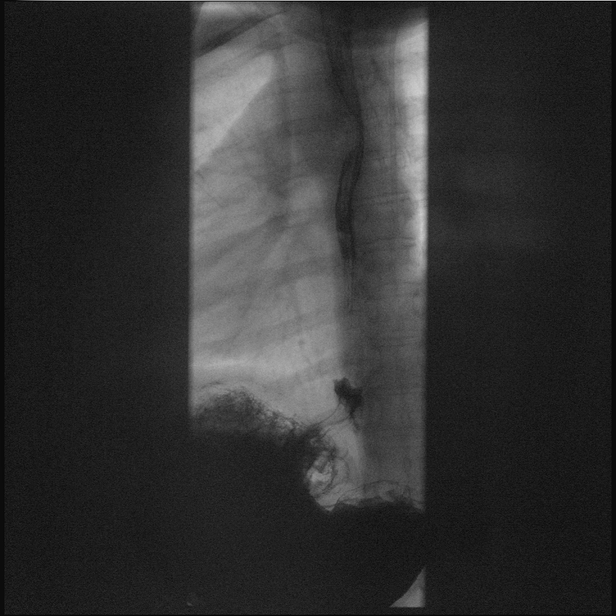

[14 of 24 positions shown; findings below may reference images not displayed]

FINDINGS: Normal pharyngeal anatomy and motility. Contrast flowed freely
through the esophagus without evidence of a stricture or mass.
Normal esophageal mucosa without evidence of irregularity or
ulceration. Esophageal motility was normal. No evidence of reflux.
No definite hiatal hernia was demonstrated.

At the end of the examination a 13 mm barium tablet was administered
which transited through the esophagus and esophagogastric junction
without delay.
IMPRESSION: Normal barium swallow.

## 2020-12-12 ENCOUNTER — Other Ambulatory Visit: Payer: Self-pay | Admitting: Nurse Practitioner

## 2020-12-12 NOTE — Telephone Encounter (Signed)
Requested medications are due for refill today NO  Requested medications are on the active medication list NO, dose inconsistent  Last refill 09/12/20  Notes to clinic dose inconsistent with current med list. Requested Prescriptions  Pending Prescriptions Disp Refills   atorvastatin (LIPITOR) 20 MG tablet [Pharmacy Med Name: ATORVASTATIN 20 MG TABLET] 90 tablet 0    Sig: TAKE 1 TABLET BY MOUTH EVERY DAY     Cardiovascular:  Antilipid - Statins Failed - 12/12/2020 12:47 AM      Failed - Total Cholesterol in normal range and within 360 days    Cholesterol, Total  Date Value Ref Range Status  11/11/2020 218 (H) 100 - 199 mg/dL Final   Cholesterol Piccolo, Waived  Date Value Ref Range Status  11/15/2016 WILL FOLLOW  Preliminary          Failed - LDL in normal range and within 360 days    LDL Chol Calc (NIH)  Date Value Ref Range Status  11/11/2020 139 (H) 0 - 99 mg/dL Final          Failed - Triglycerides in normal range and within 360 days    Triglycerides  Date Value Ref Range Status  11/11/2020 167 (H) 0 - 149 mg/dL Final   Triglycerides Piccolo,Waived  Date Value Ref Range Status  11/15/2016 WILL FOLLOW  Preliminary          Passed - HDL in normal range and within 360 days    HDL  Date Value Ref Range Status  11/11/2020 49 >39 mg/dL Final          Passed - Patient is not pregnant      Passed - Valid encounter within last 12 months    Recent Outpatient Visits           1 month ago Type 2 diabetes mellitus with hyperglycemia, with long-term current use of insulin ([place])   [place], Jolene T, NP   10 months ago Type 2 diabetes mellitus with hyperglycemia, with long-term current use of insulin ([place])   [place], [place] T, NP   1 year ago Type 2 diabetes mellitus with hyperglycemia, with long-term current use of insulin ([place])   [place], [place] T, NP   1 year ago Hyperlipidemia associated with  type 2 diabetes mellitus ([place])   Kittrell Wolverton, Jolene T, NP   1 year ago Type 2 diabetes mellitus with hyperglycemia, with long-term current use of insulin ([place])   [place], Barbaraann Faster, NP       Future Appointments             In 2 months Cannady, Barbaraann Faster, NP [employer]

## 2020-12-15 ENCOUNTER — Encounter: Payer: Self-pay | Admitting: Internal Medicine

## 2020-12-15 NOTE — Progress Notes (Signed)
Unable to reach the patient. Left a voicemail. X-ray esophagus normal. Recommend evaluation with G.I.  H/R please make a referral to [facility] regarding dysphagia. Please inform the patient of the plan.  Follow up as planned.

## 2020-12-16 ENCOUNTER — Other Ambulatory Visit: Payer: Self-pay | Admitting: *Deleted

## 2020-12-16 DIAGNOSIS — C50512 Malignant neoplasm of lower-outer quadrant of left female breast: Secondary | ICD-10-CM

## 2020-12-16 DIAGNOSIS — R1310 Dysphagia, unspecified: Secondary | ICD-10-CM

## 2020-12-21 ENCOUNTER — Encounter: Payer: Self-pay | Admitting: Nurse Practitioner

## 2020-12-21 ENCOUNTER — Other Ambulatory Visit: Payer: Self-pay

## 2020-12-21 ENCOUNTER — Ambulatory Visit (INDEPENDENT_AMBULATORY_CARE_PROVIDER_SITE_OTHER): Payer: No Typology Code available for payment source | Admitting: Nurse Practitioner

## 2020-12-21 ENCOUNTER — Ambulatory Visit: Payer: Self-pay

## 2020-12-21 VITALS — BP 101/68 | HR 92 | Temp 98.1°F | Wt 168.6 lb

## 2020-12-21 DIAGNOSIS — Z794 Long term (current) use of insulin: Secondary | ICD-10-CM

## 2020-12-21 DIAGNOSIS — R112 Nausea with vomiting, unspecified: Secondary | ICD-10-CM | POA: Diagnosis not present

## 2020-12-21 DIAGNOSIS — E1165 Type 2 diabetes mellitus with hyperglycemia: Secondary | ICD-10-CM

## 2020-12-21 DIAGNOSIS — G4452 New daily persistent headache (NDPH): Secondary | ICD-10-CM

## 2020-12-21 LAB — URINALYSIS, ROUTINE W REFLEX MICROSCOPIC
Bilirubin, UA: NEGATIVE
Leukocytes,UA: NEGATIVE
Nitrite, UA: NEGATIVE
Specific Gravity, UA: >1.03 — ABNORMAL HIGH (ref 1.005–1.030)
Urobilinogen, Ur: 0.2 mg/dL (ref 0.2–1.0)
pH, UA: 5 (ref 5.0–7.5)

## 2020-12-21 LAB — MICROSCOPIC EXAMINATION
RBC, Urine: NONE SEEN /hpf (ref 0–2)
WBC, UA: NONE SEEN /hpf (ref 0–5)

## 2020-12-21 MED ORDER — FAMOTIDINE 20 MG PO TABS: 20.0000 mg | ORAL_TABLET | Freq: Every day | ORAL | 1 refills | Status: DC

## 2020-12-21 MED ORDER — TRULICITY 0.75 MG/0.5ML ~~LOC~~ SOAJ: 0.7500 mg | SUBCUTANEOUS | 4 refills | Status: DC

## 2020-12-21 MED ORDER — METOCLOPRAMIDE HCL 5 MG PO TABS: 5.0000 mg | ORAL_TABLET | Freq: Four times a day (QID) | ORAL | 0 refills | Status: DC

## 2020-12-21 NOTE — Telephone Encounter (Signed)
Second attempt to reach pt to discuss symptoms.Left VM to call back.

## 2020-12-21 NOTE — Progress Notes (Signed)
Acute Office Visit  Subjective:    Patient ID: Carolyn Roy, female    DOB: Oct 05, 1965, 55 y.o.   MRN: [id number]  Chief Complaint  Patient presents with   Migraine    Patient states she has been dealing with migraines for the past 2 months and states they have gradually gotten worse as she is having them every day and she has not been able to take anything as she has been having trouble keeping things down.    Emesis    Patient states she is here as she has been vomiting for the past month and states she can't even keep anything down such as water nor has she been able to take her prescriptions due to not being able to keep things on her stomach. Patient states she was informed by her Oncologist Dr.B that he was going to refer to an Endocrinologist to be seen.     HPI Patient is in today for a headache for the last 2 months and vomiting after eating meals for the last month. These migraines/headaches feel different than prior migraines. Has a history of breast cancer with chemo and radiation in 2019-2020.   MIGRAINES  Duration: months Onset: gradual Severity: 7/10 Quality: aching and pressure-like Frequency:  almost daily Location: forehead and down side of face Headache duration: doesn't go away Radiation: no Time of day headache occurs:  Alleviating factors: excedrin sometimes helped, not recently Aggravating factors: unsure Headache status at time of visit: current headache Treatments attempted: Treatments attempted: rest and aleve", excedrine   Aura: no Nausea:  yes Vomiting: yes Photophobia:  yes Phonophobia:  no Effect on social functioning:  yes Numbers of missed days of school/work each month: a few times Confusion:  no Gait disturbance/ataxia:  no Behavioral changes:  no Fevers:  no  VOMITING  Episodes occur about an hour after eating. Can sometimes keep down liquids, grits and jello. Endorses nausea most of the day. Denies abdominal pain. Recently had  trulicity increased, however this was after the vomiting and nausea already started.  Past Medical History:  Diagnosis Date   Allergy    Breast cancer ([place])    Cancer ([place]) 06/15/2017   5.1 cm, T3,N1 (clinical): ER/ PR positive, Her 2 neu not overexpressed, High Ki 67. Neuoadjuvant chemotherapy.    Depression    Diabetes mellitus without complication ([place]) 4287   Edema of left upper extremity    Endometriosis    Family history of breast cancer    Headache    migraines   Hyperlipidemia    Lymphedema of left arm    Ovarian mass    Personal history of chemotherapy    Personal history of radiation therapy    Pneumonia    2018    Past Surgical History:  Procedure Laterality Date   AXILLARY LYMPH NODE BIOPSY Left 07/14/2017   Procedure: INSERTION GEL MARK CLIP LEFT AXILLA;  Surgeon: Robert Bellow, MD;  Location: [facility] ORS;  Service: General;  Laterality: Left;   BREAST BIOPSY Left    Dr Orlene Och BREAST METASTATIC CARCINOMA   BREAST LUMPECTOMY Left 01/12/2018   COLONOSCOPY WITH PROPOFOL N/A 12/27/2019   Procedure: COLONOSCOPY WITH PROPOFOL;  Surgeon: Virgel Manifold, MD;  Location: [facility];  Service: Endoscopy;  Laterality: N/A;   OOPHORECTOMY     PARTIAL MASTECTOMY WITH NEEDLE LOCALIZATION Left 01/12/2018   Procedure: PARTIAL MASTECTOMY WITH NEEDLE LOCALIZATION;  Surgeon: Robert Bellow, MD;  Location: [facility];  Service: General;  Laterality: Left;   PORTACATH PLACEMENT Right 07/14/2017   Procedure: INSERTION PORT-A-CATH;  Surgeon: Robert Bellow, MD;  Location: [facility];  Service: General;  Laterality: Right;   SENTINEL NODE BIOPSY Left 01/12/2018   Procedure: SENTINEL NODE BIOPSY;  Surgeon: Robert Bellow, MD;  Location: [facility];  Service: General;  Laterality: Left;   TUBAL LIGATION      Family History  Problem Relation Age of Onset   Colon cancer Mother    Cancer Mother    Other Father        No info about father or paternal relatives    Diabetes Brother    Pancreatitis Brother    Prostate cancer Brother 46       currently 80 / maternal half-brother   Breast cancer Maternal Grandmother 2       deceased 21s   Colon cancer Maternal Grandmother    Breast cancer Maternal Aunt 32       currently 67   Breast cancer Other 82       mother's sister; deceased 11   Breast cancer Other        mother's sister; age at dx unknown    Social History   Socioeconomic History   Marital status: Married    Spouse name: Not on file   Number of children: Not on file   Years of education: Not on file   Highest education level: Not on file  Occupational History   Not on file  Tobacco Use   Smoking status: Every Day    Packs/day: 1.00    Years: 11.00    Pack years: 11.00    Types: Cigarettes   Smokeless tobacco: Former    Types: Snuff  Vaping Use   Vaping Use: Never used  Substance and Sexual Activity   Alcohol use: No    Alcohol/week: 0.0 standard drinks   Drug use: No   Sexual activity: Yes  Other Topics Concern   Not on file  Social History Narrative   Not on file   Social Determinants of Health   Financial Resource Strain: Not on file  Food Insecurity: Not on file  Transportation Needs: Not on file  Physical Activity: Not on file  Stress: Not on file  Social Connections: Not on file  Intimate Partner Violence: Not on file    Outpatient Medications Prior to Visit  Medication Sig Dispense Refill   aspirin-acetaminophen-caffeine (EXCEDRIN MIGRAINE) 250-250-65 MG tablet Take 2 tablets by mouth daily as needed for headache.      atorvastatin (LIPITOR) 40 MG tablet Take 1 tablet (40 mg total) by mouth daily. 90 tablet 4   Blood Glucose Monitoring Suppl (ONETOUCH VERIO) w/Device KIT Use to check blood sugar 3 times a day and document results, bring to appointments.  Goal is <130 fasting blood sugar and <180 two hours after meals. 1 kit 0   DULoxetine (CYMBALTA) 60 MG capsule Take 60 mg by mouth daily.     glucose  blood test strip Use to check blood sugar 3 times daily, fasting in morning with goal <130 and 2 hours after meals with goal <180.  Bring blood sugar log to visits. 100 each 12   goserelin (ZOLADEX) 3.6 MG injection Inject 3.6 mg into the skin every 28 (twenty-eight) days.     Insulin Pen Needle 31G X 8 MM MISC Use daily to administer insulin 100 each 12   JARDIANCE 25 MG TABS tablet Take 25 mg by mouth daily.  lamoTRIgine (LAMICTAL) 150 MG tablet Take by mouth every morning.     Lancets (ONETOUCH ULTRASOFT) lancets Use as instructed 100 each 12   oxyCODONE (OXY IR/ROXICODONE) 5 MG immediate release tablet Take 1 tablet (5 mg total) by mouth 2 (two) times daily as needed for severe pain. Must last 30 days 60 tablet 0   [START ON 01/05/2021] oxyCODONE (OXY IR/ROXICODONE) 5 MG immediate release tablet Take 1 tablet (5 mg total) by mouth 2 (two) times daily as needed for severe pain. Must last 30 days 60 tablet 0   [START ON 02/04/2021] oxyCODONE (OXY IR/ROXICODONE) 5 MG immediate release tablet Take 1 tablet (5 mg total) by mouth 2 (two) times daily as needed for severe pain. Must last 30 days 60 tablet 0   sertraline (ZOLOFT) 100 MG tablet Take 100 mg by mouth daily.     tamoxifen (NOLVADEX) 10 MG tablet TAKE 1 TABLET BY MOUTH EVERY DAY 90 tablet 1   traZODone (DESYREL) 50 MG tablet Take 100 mg by mouth at bedtime as needed.     Vitamin D, Ergocalciferol, (DRISDOL) 1.25 MG (50000 UNIT) CAPS capsule TAKE 1 CAPSULE BY MOUTH ONE TIME PER WEEK 12 capsule 1   Dulaglutide (TRULICITY) 1.5 NF/6.2ZH SOPN Inject 1.5 mg into the skin once a week. 6 mL 4   Facility-Administered Medications Prior to Visit  Medication Dose Route Frequency Provider Last Rate Last Admin   heparin lock flush 100 unit/mL  500 Units Intravenous Once Corcoran, Melissa C, MD       sodium chloride flush (NS) 0.9 % injection 10 mL  10 mL Intravenous Once Lequita Asal, MD        Allergies  Allergen Reactions   Aspirin Nausea  And Vomiting    Review of Systems  Constitutional:  Positive for fatigue. Negative for fever.  HENT: Negative.    Respiratory: Negative.    Cardiovascular: Negative.   Gastrointestinal:  Positive for constipation, nausea and vomiting. Negative for abdominal pain and diarrhea.  Genitourinary: Negative.   Musculoskeletal:  Positive for arthralgias (chronic knee pain).  Skin: Negative.   Neurological:  Positive for dizziness and headaches.      Objective:    Physical Exam Vitals and nursing note reviewed.  Constitutional:      General: She is not in acute distress.    Appearance: Normal appearance.  HENT:     Head: Normocephalic.     Comments: Pain with light palpation on temporal artery bilaterally Eyes:     Extraocular Movements: Extraocular movements intact.     Conjunctiva/sclera: Conjunctivae normal.     Pupils: Pupils are equal, round, and reactive to light.  Cardiovascular:     Rate and Rhythm: Normal rate and regular rhythm.     Pulses: Normal pulses.     Heart sounds: Normal heart sounds.  Pulmonary:     Effort: Pulmonary effort is normal.     Breath sounds: Normal breath sounds.  Abdominal:     General: There is no distension.     Palpations: Abdomen is soft.     Tenderness: There is abdominal tenderness (generalized). There is no right CVA tenderness, left CVA tenderness, guarding or rebound.  Musculoskeletal:     Cervical back: Normal range of motion.  Skin:    General: Skin is warm.  Neurological:     General: No focal deficit present.     Mental Status: She is alert and oriented to person, place, and time.  Psychiatric:  Mood and Affect: Mood normal.        Behavior: Behavior normal.        Thought Content: Thought content normal.        Judgment: Judgment normal.    BP 101/68   Pulse 92   Temp 98.1 F (36.7 C) (Oral)   Wt 168 lb 9.6 oz (76.5 kg)   LMP  (LMP Unknown) Comment: LAST PERIOD IN MAY 2019 WHEN SHE STARTED CHEMO  SpO2 100%   BMI  28.94 kg/m  Wt Readings from Last 3 Encounters:  12/21/20 168 lb 9.6 oz (76.5 kg)  12/03/20 167 lb (75.8 kg)  12/01/20 170 lb (77.1 kg)    Health Maintenance Due  Topic Date Due   PAP SMEAR-Modifier  07/05/2020    There are no preventive care reminders to display for this patient.   Lab Results  Component Value Date   TSH 2.760 11/11/2020   Lab Results  Component Value Date   WBC 10.2 12/01/2020   HGB 16.5 (H) 12/01/2020   HCT 48.5 (H) 12/01/2020   MCV 95.7 12/01/2020   PLT 236 12/01/2020   Lab Results  Component Value Date   NA 139 12/01/2020   K 4.1 12/01/2020   CO2 27 12/01/2020   GLUCOSE 264 (H) 12/01/2020   BUN 12 12/01/2020   CREATININE 0.59 12/01/2020   BILITOT 0.7 12/01/2020   ALKPHOS 60 12/01/2020   AST 17 12/01/2020   ALT 17 12/01/2020   PROT 7.5 12/01/2020   ALBUMIN 4.1 12/01/2020   CALCIUM 9.3 12/01/2020   ANIONGAP 7 12/01/2020   Lab Results  Component Value Date   CHOL 218 (H) 11/11/2020   Lab Results  Component Value Date   HDL 49 11/11/2020   Lab Results  Component Value Date   LDLCALC 139 (H) 11/11/2020   Lab Results  Component Value Date   TRIG 167 (H) 11/11/2020   No results found for: CHOLHDL Lab Results  Component Value Date   HGBA1C 9.7 (H) 11/11/2020       Assessment & Plan:   Problem List Items Addressed This Visit       Endocrine   Type 2 diabetes mellitus with hyperglycemia, with long-term current use of insulin ([place])    As per plan below, decreasing trulicity to 1.91YN weekly to see if helps with nausea and vomiting.       Relevant Medications   Dulaglutide (TRULICITY) 8.29 FA/2.1HY SOPN   Other Visit Diagnoses     New daily persistent headache    -  Primary   Different than prior migraines. Will check CMP, CBC, ESR, and CRP today. If normal, will check MRI. Concern for temporal arteritis.    Relevant Medications   sertraline (ZOLOFT) 100 MG tablet   Other Relevant Orders   CBC with Differential/Platelet    Comprehensive metabolic panel   Sed Rate (ESR)   C-reactive protein   Nausea and vomiting, unspecified vomiting type       Related to headache vs gastroparesis vs GERD. Check UA. Decrease trulicity back to 8.65HQ. Start pepcid and reglan. Continue bland diet. F/U in 1 week.   Relevant Orders   CBC with Differential/Platelet   Comprehensive metabolic panel   Urinalysis, Routine w reflex microscopic        Meds ordered this encounter  Medications   Dulaglutide (TRULICITY) 4.69 GE/9.5MW SOPN    Sig: Inject 0.75 mg into the skin once a week.    Dispense:  0.5 mL  Refill:  4   metoCLOPramide (REGLAN) 5 MG tablet    Sig: Take 1 tablet (5 mg total) by mouth 4 (four) times daily.    Dispense:  56 tablet    Refill:  0   famotidine (PEPCID) 20 MG tablet    Sig: Take 1 tablet (20 mg total) by mouth daily.    Dispense:  30 tablet    Refill:  1      Charyl Dancer, NP

## 2020-12-21 NOTE — Telephone Encounter (Signed)
Pt. States she has had vomiting and headaches x 1 month. Keeping liquids down. States "I'm not dehydrated." States she messaged PCP vis My Chart and "she told me to make an appointment with anyone." Appointment made for today.     Reason for Disposition  [1] MILD or MODERATE vomiting AND [2] present > 48 hours (2 days) (Exception: mild vomiting with associated diarrhea)  Answer Assessment - Initial Assessment Questions 1. VOMITING SEVERITY: "How many times have you vomited in the past 24 hours?"     - MILD:  1 - 2 times/day    - MODERATE: 3 - 5 times/day, decreased oral intake without significant weight loss or symptoms of dehydration    - SEVERE: 6 or more times/day, vomits everything or nearly everything, with significant weight loss, symptoms of dehydration      1-4 2. ONSET: "When did the vomiting begin?"      1 month ago 3. FLUIDS: "What fluids or food have you vomited up today?" "Have you been able to keep any fluids down?"     No 4. ABDOMINAL PAIN: "Are your having any abdominal pain?" If yes : "How bad is it and what does it feel like?" (e.g., crampy, dull, intermittent, constant)      Right side, upper 5. DIARRHEA: "Is there any diarrhea?" If Yes, ask: "How many times today?"      No 6. CONTACTS: "Is there anyone else in the family with the same symptoms?"      No 7. CAUSE: "What do you think is causing your vomiting?"     Unsure 8. HYDRATION STATUS: "Any signs of dehydration?" (e.g., dry mouth [not only dry lips], too weak to stand) "When did you last urinate?"     No 9. OTHER SYMPTOMS: "Do you have any other symptoms?" (e.g., fever, headache, vertigo, vomiting blood or coffee grounds, recent head injury)     Headaches 10. PREGNANCY: "Is there any chance you are pregnant?" "When was your last menstrual period?"       No  Protocols used: Vomiting-A-AH

## 2020-12-21 NOTE — Telephone Encounter (Signed)
  Called patient - Carolyn Roy      Pt called stating that she has been having severe vomiting x1 month and the severe headaches. She states that she is not able to keep food, water, or her medication down. Please advise.

## 2020-12-21 NOTE — Assessment & Plan Note (Signed)
As per plan below, decreasing trulicity to 0.75mg  weekly to see if helps with nausea and vomiting.

## 2020-12-22 LAB — CBC WITH DIFFERENTIAL/PLATELET
Basophils Absolute: 0 10*3/uL (ref 0.0–0.2)
Basos: 1 %
EOS (ABSOLUTE): 0 10*3/uL (ref 0.0–0.4)
Eos: 1 %
Hematocrit: 44.8 % (ref 34.0–46.6)
Hemoglobin: 14.9 g/dL (ref 11.1–15.9)
Immature Grans (Abs): 0 10*3/uL (ref 0.0–0.1)
Immature Granulocytes: 0 %
Lymphocytes Absolute: 1.7 10*3/uL (ref 0.7–3.1)
Lymphs: 23 %
MCH: 31.9 pg (ref 26.6–33.0)
MCHC: 33.3 g/dL (ref 31.5–35.7)
MCV: 96 fL (ref 79–97)
Monocytes Absolute: 0.4 10*3/uL (ref 0.1–0.9)
Monocytes: 5 %
Neutrophils Absolute: 5.4 10*3/uL (ref 1.4–7.0)
Neutrophils: 70 %
Platelets: 212 10*3/uL (ref 150–450)
RBC: 4.67 x10E6/uL (ref 3.77–5.28)
RDW: 11.7 % (ref 11.7–15.4)
WBC: 7.6 10*3/uL (ref 3.4–10.8)

## 2020-12-22 LAB — COMPREHENSIVE METABOLIC PANEL
ALT: 10 IU/L (ref 0–32)
AST: 7 IU/L (ref 0–40)
Albumin/Globulin Ratio: 2.1 (ref 1.2–2.2)
Albumin: 4.2 g/dL (ref 3.8–4.9)
Alkaline Phosphatase: 73 IU/L (ref 44–121)
BUN/Creatinine Ratio: 17 (ref 9–23)
BUN: 10 mg/dL (ref 6–24)
Bilirubin Total: 0.3 mg/dL (ref 0.0–1.2)
CO2: 25 mmol/L (ref 20–29)
Calcium: 9.5 mg/dL (ref 8.7–10.2)
Chloride: 99 mmol/L (ref 96–106)
Creatinine, Ser: 0.58 mg/dL (ref 0.57–1.00)
Globulin, Total: 2 g/dL (ref 1.5–4.5)
Glucose: 261 mg/dL — ABNORMAL HIGH (ref 70–99)
Potassium: 4.2 mmol/L (ref 3.5–5.2)
Sodium: 140 mmol/L (ref 134–144)
Total Protein: 6.2 g/dL (ref 6.0–8.5)
eGFR: 108 mL/min/{1.73_m2} (ref >59)

## 2020-12-22 LAB — SEDIMENTATION RATE: Sed Rate: 3 mm/hr (ref 0–40)

## 2020-12-22 LAB — C-REACTIVE PROTEIN: CRP: <1 mg/L (ref 0–10)

## 2020-12-22 NOTE — Addendum Note (Signed)
Addended by: Vance Peper A on: 12/22/2020 10:28 AM   Modules accepted: Orders

## 2020-12-29 ENCOUNTER — Encounter: Payer: Self-pay | Admitting: Nurse Practitioner

## 2020-12-29 ENCOUNTER — Other Ambulatory Visit: Payer: Self-pay

## 2020-12-29 ENCOUNTER — Ambulatory Visit (INDEPENDENT_AMBULATORY_CARE_PROVIDER_SITE_OTHER): Payer: No Typology Code available for payment source | Admitting: Nurse Practitioner

## 2020-12-29 DIAGNOSIS — K219 Gastro-esophageal reflux disease without esophagitis: Secondary | ICD-10-CM | POA: Diagnosis not present

## 2020-12-29 NOTE — Patient Instructions (Signed)

## 2020-12-29 NOTE — Progress Notes (Signed)
BP 99/65   Pulse 92   Temp 98.5 F (36.9 C) (Oral)   Wt 167 lb 3.2 oz (75.8 kg)   LMP  (LMP Unknown) Comment: LAST PERIOD IN MAY 2019 WHEN SHE STARTED CHEMO  SpO2 99%   BMI 28.70 kg/m    Subjective:    Patient ID: Carolyn Roy, female    DOB: 1965/09/21, 55 y.o.   MRN: [id number]  HPI: Carolyn Roy is a 55 y.o. female  Chief Complaint  Patient presents with   Nausea    Patient is here to follow up on Nausea and Vomiting. Patient states she is feeling better since being prescribed the Pepcid and Zofran.    NAUSEA AND VOMITING Follow-up today from visit on 12/21/20 when she reports having N&V.  Was prescribed Pepcid and Zofran + Trulicity decreased to 0.30 MG (had recently been increased to 1.5 MG weekly).  She reports feeling a lot better without any further N&V.  No abdominal pain, reports some mild tenderness.  Had N&V for 2 months. Status: better Treatments attempted: H2 Blocker Fever: no Nausea: no Vomiting: no Weight loss: no Decreased appetite:  varies Diarrhea: no Constipation: yes Blood in stool: no Heartburn: no Jaundice: no Rash: no Dysuria/urinary frequency: no Hematuria: no History of sexually transmitted disease: no Recurrent NSAID use: no   Relevant past medical, surgical, family and social history reviewed and updated as indicated. Interim medical history since our last visit reviewed. Allergies and medications reviewed and updated.  Review of Systems  Constitutional:  Negative for activity change, appetite change, diaphoresis, fatigue and fever.  Respiratory:  Negative for cough, chest tightness and shortness of breath.   Cardiovascular:  Negative for chest pain, palpitations and leg swelling.  Gastrointestinal: Negative.   Endocrine: Negative for cold intolerance, heat intolerance, polydipsia, polyphagia and polyuria.  Neurological: Negative.   Psychiatric/Behavioral: Negative.     Per HPI unless specifically indicated above      Objective:    BP 99/65   Pulse 92   Temp 98.5 F (36.9 C) (Oral)   Wt 167 lb 3.2 oz (75.8 kg)   LMP  (LMP Unknown) Comment: LAST PERIOD IN MAY 2019 WHEN SHE STARTED CHEMO  SpO2 99%   BMI 28.70 kg/m   Wt Readings from Last 3 Encounters:  12/29/20 167 lb 3.2 oz (75.8 kg)  12/21/20 168 lb 9.6 oz (76.5 kg)  12/03/20 167 lb (75.8 kg)    Physical Exam Vitals and nursing note reviewed.  Constitutional:      General: She is awake.     Appearance: She is well-developed and well-groomed.  HENT:     Head: Normocephalic.     Right Ear: Hearing normal.     Left Ear: Hearing normal.  Eyes:     General: Lids are normal.        Right eye: No discharge.        Left eye: No discharge.     Conjunctiva/sclera: Conjunctivae normal.     Pupils: Pupils are equal, round, and reactive to light.  Neck:     Thyroid: No thyromegaly.     Vascular: No carotid bruit.  Cardiovascular:     Rate and Rhythm: Normal rate and regular rhythm.     Heart sounds: Normal heart sounds. No murmur heard.   No gallop.  Pulmonary:     Effort: Pulmonary effort is normal. No accessory muscle usage or respiratory distress.     Breath sounds: Normal breath sounds.  Abdominal:  General: Bowel sounds are normal.     Palpations: Abdomen is soft.  Musculoskeletal:     Cervical back: Normal range of motion and neck supple.     Right lower leg: No edema.     Left lower leg: No edema.  Skin:    General: Skin is warm and dry.  Neurological:     Mental Status: She is alert and oriented to person, place, and time.  Psychiatric:        Attention and Perception: Attention normal.        Mood and Affect: Mood normal.        Speech: Speech normal.        Behavior: Behavior normal. Behavior is cooperative.        Thought Content: Thought content normal.    Results for orders placed or performed in visit on 12/21/20  Microscopic Examination   Urine  Result Value Ref Range   WBC, UA None seen 0 - 5 /hpf   RBC None  seen 0 - 2 /hpf   Epithelial Cells (non renal) 0-10 0 - 10 /hpf   Mucus, UA Present (A) Not Estab.   Bacteria, UA Moderate (A) None seen/Few   Yeast, UA Present (A) None seen  CBC with Differential/Platelet  Result Value Ref Range   WBC 7.6 3.4 - 10.8 x10E3/uL   RBC 4.67 3.77 - 5.28 x10E6/uL   Hemoglobin 14.9 11.1 - 15.9 g/dL   Hematocrit 44.8 34.0 - 46.6 %   MCV 96 79 - 97 fL   MCH 31.9 26.6 - 33.0 pg   MCHC 33.3 31.5 - 35.7 g/dL   RDW 11.7 11.7 - 15.4 %   Platelets 212 150 - 450 x10E3/uL   Neutrophils 70 Not Estab. %   Lymphs 23 Not Estab. %   Monocytes 5 Not Estab. %   Eos 1 Not Estab. %   Basos 1 Not Estab. %   Neutrophils Absolute 5.4 1.4 - 7.0 x10E3/uL   Lymphocytes Absolute 1.7 0.7 - 3.1 x10E3/uL   Monocytes Absolute 0.4 0.1 - 0.9 x10E3/uL   EOS (ABSOLUTE) 0.0 0.0 - 0.4 x10E3/uL   Basophils Absolute 0.0 0.0 - 0.2 x10E3/uL   Immature Granulocytes 0 Not Estab. %   Immature Grans (Abs) 0.0 0.0 - 0.1 x10E3/uL  Comprehensive metabolic panel  Result Value Ref Range   Glucose 261 (H) 70 - 99 mg/dL   BUN 10 6 - 24 mg/dL   Creatinine, Ser 0.58 0.57 - 1.00 mg/dL   eGFR 108 >59 mL/min/1.73   BUN/Creatinine Ratio 17 9 - 23   Sodium 140 134 - 144 mmol/L   Potassium 4.2 3.5 - 5.2 mmol/L   Chloride 99 96 - 106 mmol/L   CO2 25 20 - 29 mmol/L   Calcium 9.5 8.7 - 10.2 mg/dL   Total Protein 6.2 6.0 - 8.5 g/dL   Albumin 4.2 3.8 - 4.9 g/dL   Globulin, Total 2.0 1.5 - 4.5 g/dL   Albumin/Globulin Ratio 2.1 1.2 - 2.2   Bilirubin Total 0.3 0.0 - 1.2 mg/dL   Alkaline Phosphatase 73 44 - 121 IU/L   AST 7 0 - 40 IU/L   ALT 10 0 - 32 IU/L  Sed Rate (ESR)  Result Value Ref Range   Sed Rate 3 0 - 40 mm/hr  C-reactive protein  Result Value Ref Range   CRP <1 0 - 10 mg/L  Urinalysis, Routine w reflex microscopic  Result Value Ref Range   Specific Gravity,  UA >1.030 (H) 1.005 - 1.030   pH, UA 5.0 5.0 - 7.5   Color, UA Yellow Yellow   Appearance Ur Cloudy (A) Clear   Leukocytes,UA  Negative Negative   Protein,UA Trace (A) Negative/Trace   Glucose, UA 2+ (A) Negative   Ketones, UA Trace (A) Negative   RBC, UA Trace (A) Negative   Bilirubin, UA Negative Negative   Urobilinogen, Ur 0.2 0.2 - 1.0 mg/dL   Nitrite, UA Negative Negative   Microscopic Examination See below:       Assessment & Plan:   Problem List Items Addressed This Visit       Digestive   GERD (gastroesophageal reflux disease)    Improved at this time with Pepcid on board, will continue this and will not increase Trulicity further due to increased reflux with this.  May need to return to insulin use for diabetes in January.  Return as scheduled 02/12/21.        Follow up plan: Return for return as scheduled 02/13/20.

## 2020-12-29 NOTE — Assessment & Plan Note (Signed)
Improved at this time with Pepcid on board, will continue this and will not increase Trulicity further due to increased reflux with this.  May need to return to insulin use for diabetes in January.  Return as scheduled 02/12/21.

## 2020-12-31 ENCOUNTER — Other Ambulatory Visit: Payer: Self-pay | Admitting: Nurse Practitioner

## 2020-12-31 NOTE — Telephone Encounter (Signed)
Requested medications are due for refill today.  unsure  Requested medications are on the active medications list.  yes  Last refill. 12/25/2019  Future visit scheduled.   yes  Notes to clinic.  Historical medication

## 2021-01-03 LAB — HM DIABETES EYE EXAM

## 2021-01-06 ENCOUNTER — Ambulatory Visit: Admission: RE | Admit: 2021-01-06 | Payer: No Typology Code available for payment source | Source: Ambulatory Visit

## 2021-01-06 ENCOUNTER — Ambulatory Visit
Admission: RE | Admit: 2021-01-06 | Discharge: 2021-01-06 | Disposition: A | Discharge status: Home / Self Care | Payer: No Typology Code available for payment source | Source: Ambulatory Visit | Attending: Nurse Practitioner | Admitting: Nurse Practitioner

## 2021-01-06 DIAGNOSIS — G4452 New daily persistent headache (NDPH): Secondary | ICD-10-CM | POA: Diagnosis not present

## 2021-01-06 IMAGING — MR MR HEAD WO/W CM
14 series · 48 of 48 positions shown · IV contrast (gadavist)
Comparison: None.

CLINICAL DATA: Daily headaches, mainly behind ears for 2 months.

EXAM:
MRI HEAD WITHOUT AND WITH CONTRAST
TECHNIQUE: Multiplanar, multiecho pulse sequences of the brain and surrounding
structures were obtained without and with intravenous contrast.
CONTRAST:  7.5mL GADAVIST GADOBUTROL 1 MMOL/ML IV SOLN

[Series 5: ax dwi_tracew · axial · 3.0mm · 0.65mm/px · z∈[-95,+58]mm · 4 of 48 slices shown]
[im 1/48]
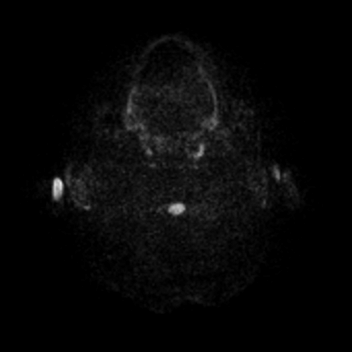
[im 16/48]
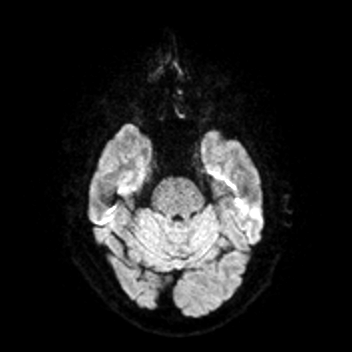
[im 32/48]
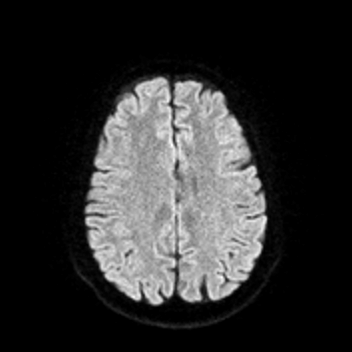
[im 48/48]
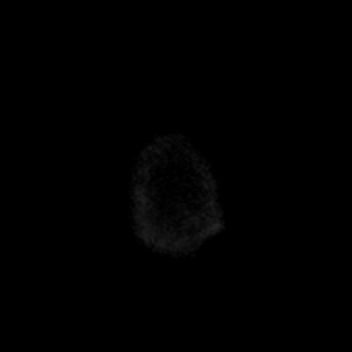

[Series 6: ax dwi_adc · axial · 3.0mm · 0.65mm/px · z∈[-95,+52]mm · 3 of 46 slices shown]
[im 1/46]
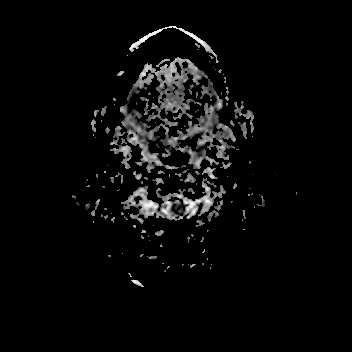
[im 23/46]
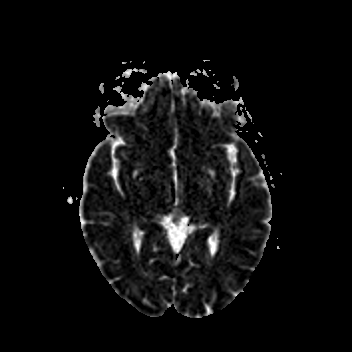
[im 46/46]
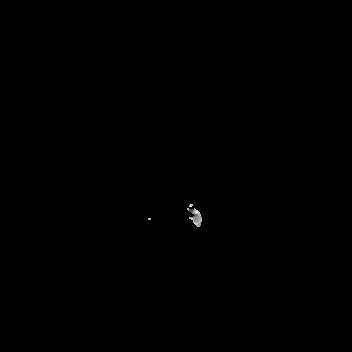

[Series 7: cor dwi_tracew · coronal · 5.0mm · 0.68mm/px · 2 of 38 slices shown]
[im 1/38]
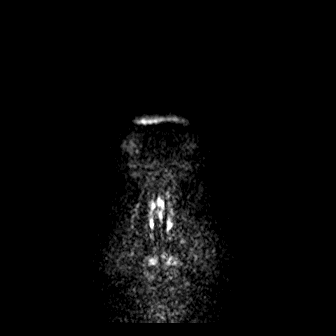
[im 38/38]
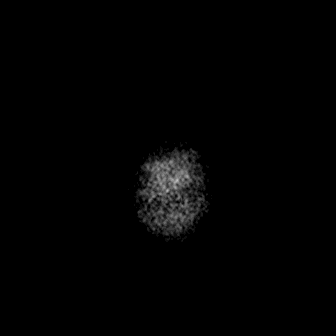

[Series 8: cor dwi_adc · coronal · 5.0mm · 0.68mm/px · 2 of 38 slices shown]
[im 1/38]
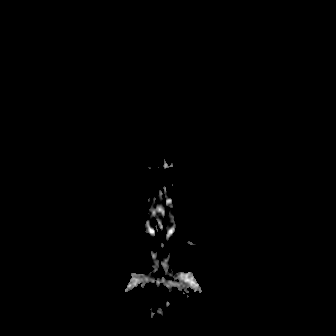
[im 38/38]
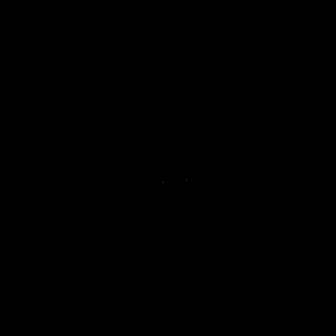

[Series 9: T1 · sagittal · 5.0mm · 0.62mm/px · 1 of 21 slices shown (1 of 2)]
[im 1/21]
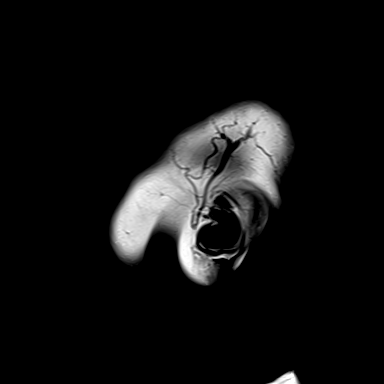

[Series 10: T2 · axial · 5.0mm · 0.53mm/px · z∈[-96,+59]mm · 2 of 27 slices shown]
[im 1/27]
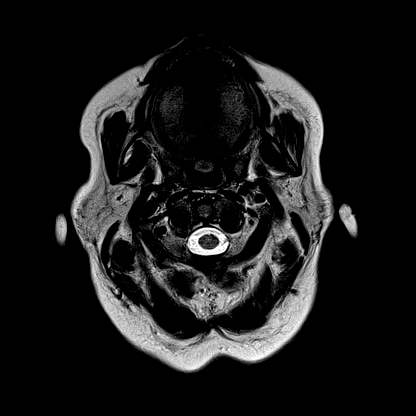
[im 27/27]
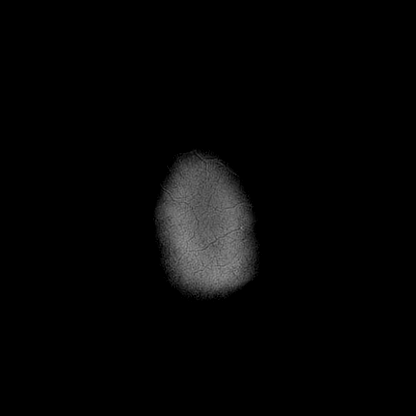

[Series 12: pha_images · axial · 3.0mm · 0.90mm/px · z∈[-95,+57]mm · 3 of 52 slices shown]
[im 1/52]
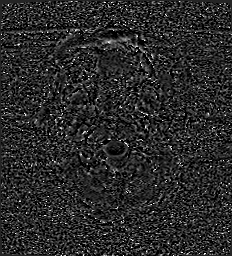
[im 26/52]
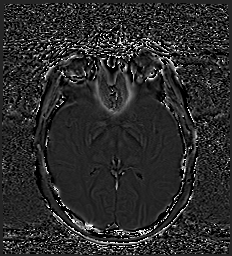
[im 52/52]
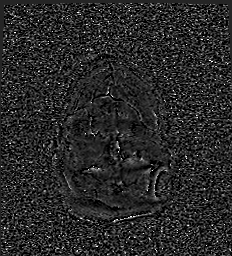

[Series 13: swi_images · axial · 3.0mm · 0.90mm/px · z∈[-95,+57]mm · 3 of 52 slices shown]
[im 1/52]
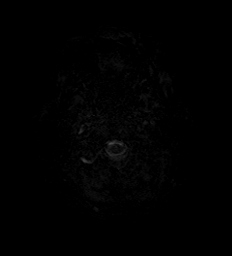
[im 26/52]
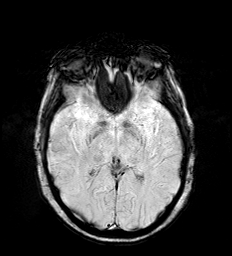
[im 52/52]
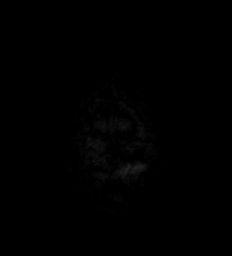

[Series 15: FLAIR · axial · 3.0mm · 0.53mm/px · z∈[-99,+62]mm · 3 of 55 slices shown]
[im 1/55]
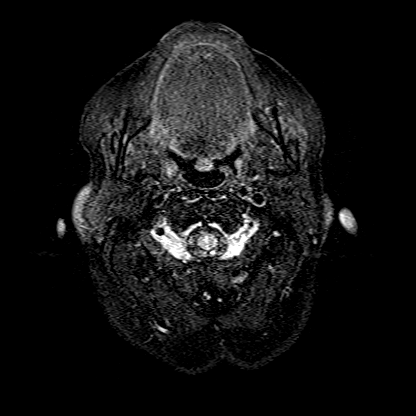
[im 28/55]
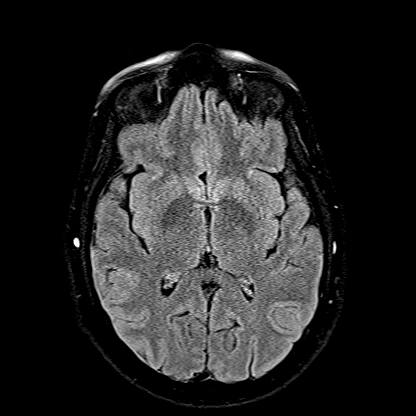
[im 55/55]
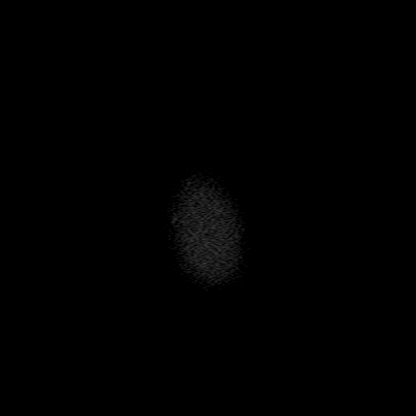

[Series 16: T1 · axial · 1.0mm · 0.98mm/px · z∈[-101,+72]mm · 10 of 174 slices shown (2 of 2)]
[im 1/174]
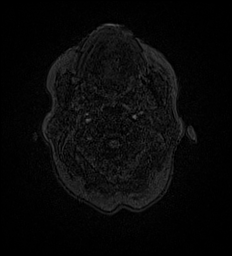
[im 20/174]
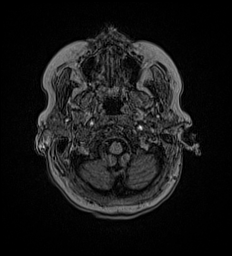
[im 39/174]
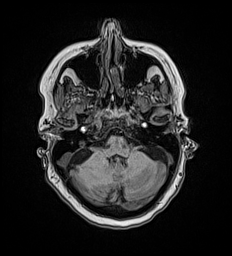
[im 58/174]
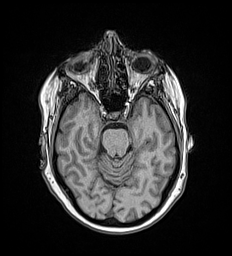
[im 77/174]
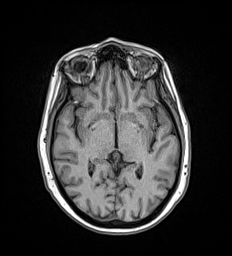
[im 97/174]
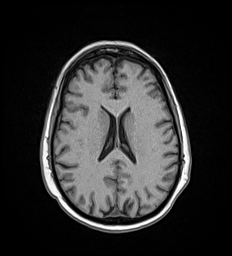
[im 116/174]
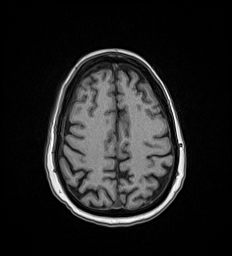
[im 135/174]
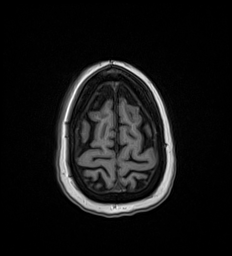
[im 154/174]
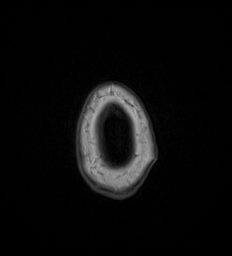
[im 174/174]
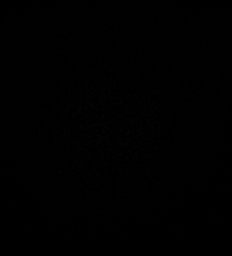

[Series 17: T2 post-contrast · coronal · 5.0mm · 0.57mm/px · 2 of 29 slices shown]
[im 1/29]
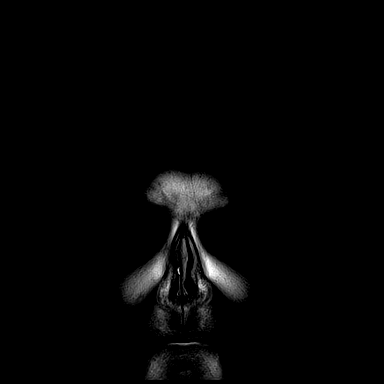
[im 29/29]
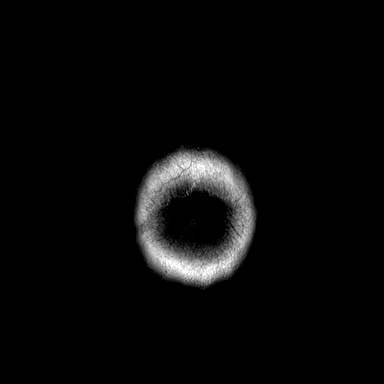

[Series 18: T1 post-contrast · axial · 1.0mm · 0.98mm/px · z∈[-101,+72]mm · 10 of 173 slices shown (1 of 3)]
[im 1/173]
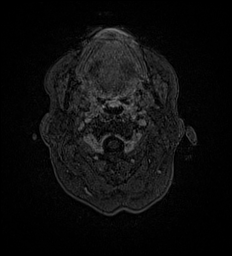
[im 20/173]
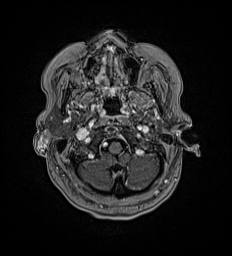
[im 39/173]
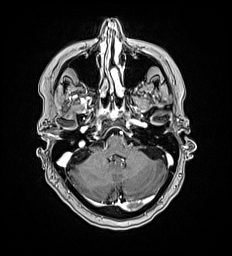
[im 58/173]
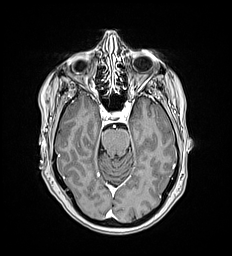
[im 77/173]
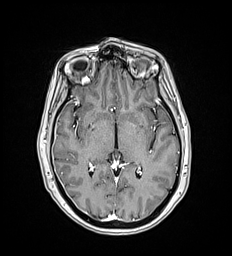
[im 96/173]
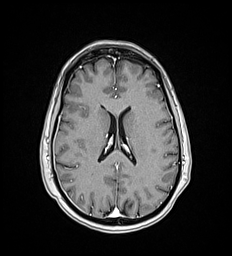
[im 115/173]
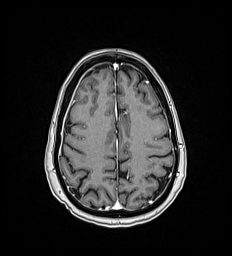
[im 134/173]
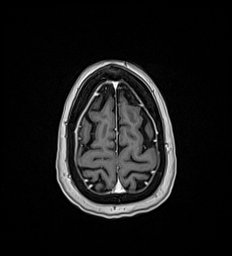
[im 153/173]
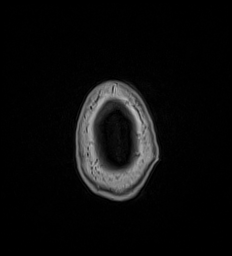
[im 173/173]
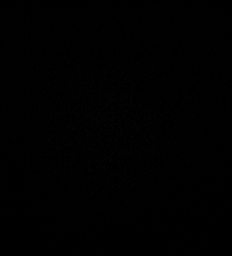

[Series 19: T1 post-contrast · coronal · 5.0mm · 0.57mm/px · 2 of 29 slices shown (2 of 3)]
[im 1/29]
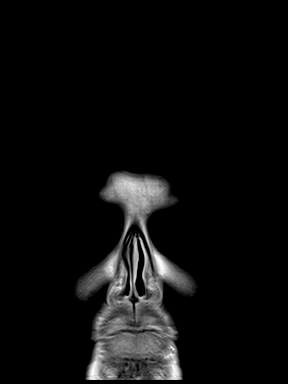
[im 29/29]
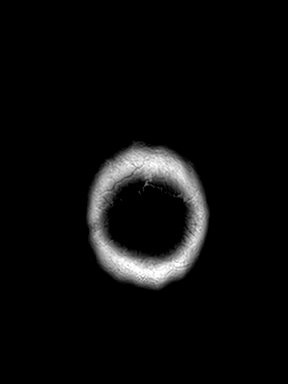

[Series 20: T1 post-contrast · sagittal · 5.0mm · 0.62mm/px · 1 of 21 slices shown (3 of 3)]
[im 1/21]
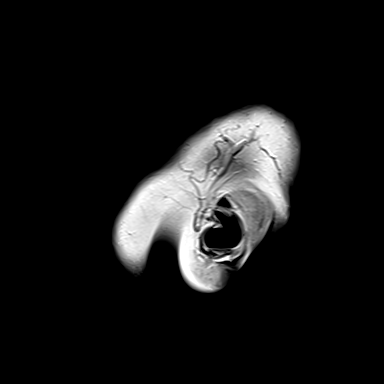

[48 of 48 positions shown; findings below may reference images not displayed]

FINDINGS: Brain: There is no acute infarction or intracranial hemorrhage.
There is no intracranial mass, mass effect, or edema. There is no
hydrocephalus. Ventricles and sulci are normal in size and
configuration. Minor dural thickening and enhancement along the
right frontoparietal convexity.

Vascular: Major vessel flow voids at the skull base are preserved.

Skull and upper cervical spine: Normal marrow signal is preserved.

Sinuses/Orbits: Paranasal sinuses are aerated. Orbits are
unremarkable.

Other: Sella is unremarkable.  Mastoid air cells are clear.
IMPRESSION: Minor dural thickening and enhancement along the right
frontoparietal convexity. This is nonspecific and of unclear
etiology in isolation as remainder of study is negative. Possibly
reflects sequelae of prior subdural hematoma. Consider follow-up
given breast cancer history.

## 2021-01-06 MED ORDER — GADOBUTROL 1 MMOL/ML IV SOLN
7.5000 mL | Freq: Once | INTRAVENOUS | Status: AC | PRN
  Administered 2021-01-06: 7.5 mL via INTRAVENOUS

## 2021-01-08 ENCOUNTER — Telehealth: Payer: Self-pay

## 2021-01-08 DIAGNOSIS — C50512 Malignant neoplasm of lower-outer quadrant of left female breast: Secondary | ICD-10-CM

## 2021-01-08 DIAGNOSIS — Z170 Estrogen receptor positive status [ER+]: Secondary | ICD-10-CM

## 2021-01-08 NOTE — Telephone Encounter (Signed)
Patient evaluated by PCP for persistnet headache and MRI was performed.  Dr. B would like patient to see Dr. Mickeal Skinner in reference to headaches/MRI results.  PCP will inform patient of need for referral.  Order entered.  Please schedule patient with Dr. Mickeal Skinner and inform patient of appointment detail.

## 2021-01-12 ENCOUNTER — Encounter: Payer: Self-pay | Admitting: Internal Medicine

## 2021-01-12 ENCOUNTER — Other Ambulatory Visit: Payer: Self-pay | Admitting: Nurse Practitioner

## 2021-01-12 NOTE — Telephone Encounter (Signed)
Requested Prescriptions  Pending Prescriptions Disp Refills  . famotidine (PEPCID) 20 MG tablet [Pharmacy Med Name: FAMOTIDINE 20 MG TABLET] 30 tablet 2    Sig: TAKE 1 TABLET BY MOUTH EVERY DAY     Gastroenterology:  H2 Antagonists Passed - 01/12/2021  2:30 PM      Passed - Valid encounter within last 12 months    Recent Outpatient Visits          2 weeks ago Gastroesophageal reflux disease without esophagitis   [place], Jolene T, NP   3 weeks ago New daily persistent headache   [place], Lauren A, NP   2 months ago Type 2 diabetes mellitus with hyperglycemia, with long-term current use of insulin ([place])   [place], Jolene T, NP   11 months ago Type 2 diabetes mellitus with hyperglycemia, with long-term current use of insulin ([place])   [place], [place] T, NP   1 year ago Type 2 diabetes mellitus with hyperglycemia, with long-term current use of insulin ([place])   [place], Barbaraann Faster, NP      Future Appointments            In 1 month [place], Barbaraann Faster, NP [facility], [facility]

## 2021-01-15 ENCOUNTER — Inpatient Hospital Stay: Payer: No Typology Code available for payment source | Attending: Internal Medicine | Admitting: Internal Medicine

## 2021-01-15 ENCOUNTER — Other Ambulatory Visit: Payer: Self-pay

## 2021-01-15 ENCOUNTER — Telehealth: Payer: Self-pay

## 2021-01-15 VITALS — BP 111/76 | HR 89 | Temp 97.4°F | Resp 18

## 2021-01-15 DIAGNOSIS — Z833 Family history of diabetes mellitus: Secondary | ICD-10-CM | POA: Diagnosis not present

## 2021-01-15 DIAGNOSIS — G4719 Other hypersomnia: Secondary | ICD-10-CM | POA: Diagnosis not present

## 2021-01-15 DIAGNOSIS — Z8042 Family history of malignant neoplasm of prostate: Secondary | ICD-10-CM | POA: Diagnosis not present

## 2021-01-15 DIAGNOSIS — C50812 Malignant neoplasm of overlapping sites of left female breast: Secondary | ICD-10-CM | POA: Diagnosis not present

## 2021-01-15 DIAGNOSIS — Z800 Family history of malignant neoplasm of digestive organs: Secondary | ICD-10-CM | POA: Diagnosis not present

## 2021-01-15 DIAGNOSIS — Z62810 Personal history of physical and sexual abuse in childhood: Secondary | ICD-10-CM | POA: Insufficient documentation

## 2021-01-15 DIAGNOSIS — F32A Depression, unspecified: Secondary | ICD-10-CM | POA: Diagnosis not present

## 2021-01-15 DIAGNOSIS — Z794 Long term (current) use of insulin: Secondary | ICD-10-CM | POA: Insufficient documentation

## 2021-01-15 DIAGNOSIS — F419 Anxiety disorder, unspecified: Secondary | ICD-10-CM | POA: Insufficient documentation

## 2021-01-15 DIAGNOSIS — Z803 Family history of malignant neoplasm of breast: Secondary | ICD-10-CM | POA: Diagnosis not present

## 2021-01-15 DIAGNOSIS — Z170 Estrogen receptor positive status [ER+]: Secondary | ICD-10-CM | POA: Insufficient documentation

## 2021-01-15 DIAGNOSIS — R0683 Snoring: Secondary | ICD-10-CM | POA: Insufficient documentation

## 2021-01-15 DIAGNOSIS — Z886 Allergy status to analgesic agent status: Secondary | ICD-10-CM | POA: Diagnosis not present

## 2021-01-15 DIAGNOSIS — M549 Dorsalgia, unspecified: Secondary | ICD-10-CM | POA: Insufficient documentation

## 2021-01-15 DIAGNOSIS — Z923 Personal history of irradiation: Secondary | ICD-10-CM | POA: Insufficient documentation

## 2021-01-15 DIAGNOSIS — Z79899 Other long term (current) drug therapy: Secondary | ICD-10-CM | POA: Diagnosis not present

## 2021-01-15 DIAGNOSIS — G43009 Migraine without aura, not intractable, without status migrainosus: Secondary | ICD-10-CM | POA: Insufficient documentation

## 2021-01-15 DIAGNOSIS — Z90721 Acquired absence of ovaries, unilateral: Secondary | ICD-10-CM | POA: Insufficient documentation

## 2021-01-15 DIAGNOSIS — G4489 Other headache syndrome: Secondary | ICD-10-CM | POA: Insufficient documentation

## 2021-01-15 DIAGNOSIS — E119 Type 2 diabetes mellitus without complications: Secondary | ICD-10-CM | POA: Diagnosis not present

## 2021-01-15 DIAGNOSIS — Z8379 Family history of other diseases of the digestive system: Secondary | ICD-10-CM | POA: Diagnosis not present

## 2021-01-15 MED ORDER — PREDNISONE 50 MG PO TABS: 50.0000 mg | ORAL_TABLET | Freq: Every day | ORAL | 0 refills | Status: DC

## 2021-01-15 NOTE — Progress Notes (Signed)
Pt reports increase in headaches over the past 2 months. States that the headache is constant and dull. Has taken Excedrin Migraine with little effectiveness.
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
## 2021-01-15 NOTE — Telephone Encounter (Signed)
Order for sleep study faxed to [facility] ([facility]). Fax confirmation received. Call placed to scheduling, no answer, left message to return my call.

## 2021-01-20 ENCOUNTER — Telehealth: Payer: Self-pay | Admitting: *Deleted

## 2021-01-20 NOTE — Telephone Encounter (Signed)
Received incoming form from the sleep studies dept. Needs a formal referral form signed by ordering provider and records on this patient. Will get NP to sign form on behalf of Dr. Mickeal Skinner.

## 2021-01-20 NOTE — Telephone Encounter (Signed)
Sonia Baller signed order and Form faxed to [place] [facility]- attention [place]

## 2021-01-26 ENCOUNTER — Telehealth: Payer: Self-pay | Admitting: Internal Medicine

## 2021-01-26 NOTE — Telephone Encounter (Signed)
Received fax from [facility] that the patient has been scheduled for a Sleep study on 03/03/2021.

## 2021-02-12 ENCOUNTER — Ambulatory Visit (INDEPENDENT_AMBULATORY_CARE_PROVIDER_SITE_OTHER): Payer: No Typology Code available for payment source | Admitting: Nurse Practitioner

## 2021-02-12 ENCOUNTER — Encounter: Payer: Self-pay | Admitting: Nurse Practitioner

## 2021-02-12 ENCOUNTER — Other Ambulatory Visit: Payer: Self-pay

## 2021-02-12 VITALS — BP 102/65 | HR 86 | Temp 98.3°F | Ht 64.0 in | Wt 169.0 lb

## 2021-02-12 DIAGNOSIS — T451X5A Adverse effect of antineoplastic and immunosuppressive drugs, initial encounter: Secondary | ICD-10-CM

## 2021-02-12 DIAGNOSIS — E1165 Type 2 diabetes mellitus with hyperglycemia: Secondary | ICD-10-CM

## 2021-02-12 DIAGNOSIS — F322 Major depressive disorder, single episode, severe without psychotic features: Secondary | ICD-10-CM | POA: Diagnosis not present

## 2021-02-12 DIAGNOSIS — E1169 Type 2 diabetes mellitus with other specified complication: Secondary | ICD-10-CM

## 2021-02-12 DIAGNOSIS — F1120 Opioid dependence, uncomplicated: Secondary | ICD-10-CM

## 2021-02-12 DIAGNOSIS — C50512 Malignant neoplasm of lower-outer quadrant of left female breast: Secondary | ICD-10-CM

## 2021-02-12 DIAGNOSIS — Z794 Long term (current) use of insulin: Secondary | ICD-10-CM

## 2021-02-12 DIAGNOSIS — F17219 Nicotine dependence, cigarettes, with unspecified nicotine-induced disorders: Secondary | ICD-10-CM

## 2021-02-12 DIAGNOSIS — K219 Gastro-esophageal reflux disease without esophagitis: Secondary | ICD-10-CM

## 2021-02-12 DIAGNOSIS — Z170 Estrogen receptor positive status [ER+]: Secondary | ICD-10-CM

## 2021-02-12 DIAGNOSIS — G620 Drug-induced polyneuropathy: Secondary | ICD-10-CM

## 2021-02-12 DIAGNOSIS — E785 Hyperlipidemia, unspecified: Secondary | ICD-10-CM

## 2021-02-12 LAB — BAYER DCA HB A1C WAIVED: HB A1C (BAYER DCA - WAIVED): 10.1 % — ABNORMAL HIGH (ref 4.8–5.6)

## 2021-02-12 LAB — MICROALBUMIN, URINE WAIVED
Creatinine, Urine Waived: 50 mg/dL (ref 10–300)
Microalb, Ur Waived: 10 mg/L (ref 0–19)
Microalb/Creat Ratio: <30 mg/g (ref <30)

## 2021-02-12 MED ORDER — METFORMIN HCL ER 500 MG PO TB24: ORAL_TABLET | ORAL | 4 refills | Status: DC

## 2021-02-12 MED ORDER — ONETOUCH VERIO W/DEVICE KIT: PACK | 0 refills | Status: AC

## 2021-02-12 NOTE — Progress Notes (Signed)
BP 102/65    Pulse 86    Temp 98.3 F (36.8 C)    Ht 5\' 4"  (1.626 m)    Wt 169 lb (76.7 kg)    LMP  (LMP Unknown) Comment: LAST PERIOD IN MAY 2019 WHEN SHE STARTED CHEMO   SpO2 98%    BMI 29.01 kg/m    Subjective:    Patient ID: Carolyn Roy, female    DOB: 10/24/1965, 56 y.o.   MRN: [id number]  HPI: Carolyn Roy is a 56 y.o. female  Chief Complaint  Patient presents with   Diabetes    Patient states she is doing well with her readings. Patient denies having any concerns at today's visit.    Hyperlipidemia   Mood   Medication Problem    Patient states she would like to discuss with provider about restarting her Metformin prescription at today's visit.    DIABETES Last A1c 9.7% in October 2022. Continues on Jardiance 25 MG and Trulicity 7.67 MG weekly -- tried 1.5 MG but unsure if this caused GI symptoms as Lamictal was increased at same time.  Has tried Metformin, she wishes to restart this at this time, tolerated XR dosing in past.  She is also followed by pain clinic for chronic pain -- last visit 11/18/20 Oxycodone.  Had some Prednisone since last visit for migraines.  She is a smoker, 1/2 PPD.  Quit for 6 years, but then started back.  She started smoking at age 83, smoked 34-35 years total. Hypoglycemic episodes:no Polydipsia/polyuria: no Visual disturbance: no Chest pain: no Paresthesias: no Glucose Monitoring: yes  Accucheck frequency: occasional  Fasting glucose: 200 range  Post prandial:  Evening:   Before meals:  Taking Insulin?: yes  Long acting insulin:   Short acting insulin: Blood Pressure Monitoring: not checking Retinal Examination: Not Up To Date Foot Exam: Up to Date Pneumovax: Up To Date Influenza: Up To Date Aspirin: no   HYPERLIPIDEMIA Taking Atorvastatin 40 MG daily, increased last visit. Satisfied with current treatment? yes Duration of hyperlipidemia: chronic Cholesterol medication side effects: no Cholesterol supplements:  none Aspirin: no Recent stressors: no Recurrent headaches: no Visual changes: no Palpitations: no Dyspnea: no Chest pain: no Lower extremity edema: no Dizzy/lightheaded: no  The 10-year ASCVD risk score (Arnett DK, et al., 2019) is: 6.5%   Values used to calculate the score:     Age: 53 years     Sex: Female     Is Non-Hispanic African American: No     Diabetic: Yes     Tobacco smoker: Yes     Systolic Blood Pressure: 209 mmHg     Is BP treated: No     HDL Cholesterol: 49 mg/dL     Total Cholesterol: 218 mg/dL   DEPRESSION Taking Duloxetine 60 MG daily, Sertraline 100 MG daily, and Trazodone as needed for sleep.  Followed by Dr. Nicolasa Ducking and last saw in December 2022.  Is going for sleep study 03/04/20.  Is currently followed by oncology for breast CA, left side.  Last saw oncology on 01/15/21 -- she is completed treatments, continues Tamoxifen and goes every 6 months for injections and infusions. Mood status: stable Satisfied with current treatment?: yes Symptom severity: mild  Duration of current treatment : chronic Side effects: no Medication compliance: good compliance Psychotherapy/counseling: looking for therapist Depressed mood: no Anxious mood: no Anhedonia: no Significant weight loss or gain: no Insomnia: yes hard to stay asleep Fatigue: no Feelings of worthlessness or guilt:  no Impaired concentration/indecisiveness: no Suicidal ideations: no Hopelessness: no Crying spells: no Depression screen [facility] 2/9 02/12/2021 11/18/2020 11/11/2020 08/18/2020 01/20/2020  Decreased Interest 0 0 0 0 0  Down, Depressed, Hopeless 0 0 0 0 0  PHQ - 2 Score 0 0 0 0 0  Altered sleeping 2 - 1 - 2  Tired, decreased energy 1 - 0 - 0  Change in appetite 1 - 1 - 3  Feeling bad or failure about yourself  0 - 0 - 0  Trouble concentrating 0 - 0 - 0  Moving slowly or fidgety/restless 0 - 0 - 0  Suicidal thoughts 0 - 0 - 0  PHQ-9 Score 4 - 2 - 5  Difficult doing work/chores - - Not difficult  at all - Somewhat difficult  Some recent data might be hidden   GAD 7 : Generalized Anxiety Score 02/12/2021  Nervous, Anxious, on Edge 0  Control/stop worrying 0  Worry too much - different things 0  Trouble relaxing 0  Restless 0  Easily annoyed or irritable 0  Afraid - awful might happen 0  Total GAD 7 Score 0    Relevant past medical, surgical, family and social history reviewed and updated as indicated. Interim medical history since our last visit reviewed. Allergies and medications reviewed and updated.  Review of Systems  Constitutional:  Negative for activity change, appetite change, diaphoresis, fatigue and fever.  Respiratory:  Negative for cough, chest tightness and shortness of breath.   Cardiovascular:  Negative for chest pain, palpitations and leg swelling.  Gastrointestinal: Negative.   Endocrine: Negative for cold intolerance, heat intolerance, polydipsia, polyphagia and polyuria.  Neurological: Negative.   Psychiatric/Behavioral: Negative.     Per HPI unless specifically indicated above     Objective:    BP 102/65    Pulse 86    Temp 98.3 F (36.8 C)    Ht 5\' 4"  (1.626 m)    Wt 169 lb (76.7 kg)    LMP  (LMP Unknown) Comment: LAST PERIOD IN MAY 2019 WHEN SHE STARTED CHEMO   SpO2 98%    BMI 29.01 kg/m   Wt Readings from Last 3 Encounters:  02/12/21 169 lb (76.7 kg)  12/29/20 167 lb 3.2 oz (75.8 kg)  12/21/20 168 lb 9.6 oz (76.5 kg)    Physical Exam Vitals and nursing note reviewed.  Constitutional:      General: She is awake.     Appearance: She is well-developed and well-groomed.  HENT:     Head: Normocephalic.     Right Ear: Hearing normal.     Left Ear: Hearing normal.  Eyes:     General: Lids are normal.        Right eye: No discharge.        Left eye: No discharge.     Conjunctiva/sclera: Conjunctivae normal.     Pupils: Pupils are equal, round, and reactive to light.  Neck:     Thyroid: No thyromegaly.     Vascular: No carotid bruit.   Cardiovascular:     Rate and Rhythm: Normal rate and regular rhythm.     Heart sounds: Normal heart sounds. No murmur heard.   No gallop.  Pulmonary:     Effort: Pulmonary effort is normal. No accessory muscle usage or respiratory distress.     Breath sounds: Normal breath sounds.  Abdominal:     General: Bowel sounds are normal.     Palpations: Abdomen is soft.  Musculoskeletal:  Cervical back: Normal range of motion and neck supple.     Right lower leg: No edema.     Left lower leg: No edema.  Skin:    General: Skin is warm and dry.  Neurological:     Mental Status: She is alert and oriented to person, place, and time.  Psychiatric:        Attention and Perception: Attention normal.        Mood and Affect: Mood normal.        Speech: Speech normal.        Behavior: Behavior normal. Behavior is cooperative.        Thought Content: Thought content normal.   Results for orders placed or performed in visit on 02/12/21  Bayer DCA Hb A1c Waived  Result Value Ref Range   HB A1C (BAYER DCA - WAIVED) 10.1 (H) 4.8 - 5.6 %  Microalbumin, Urine Waived  Result Value Ref Range   Microalb, Ur Waived 10 0 - 19 mg/L   Creatinine, Urine Waived 50 10 - 300 mg/dL   Microalb/Creat Ratio <30 <30 mg/g      Assessment & Plan:   Problem List Items Addressed This Visit       Digestive   GERD (gastroesophageal reflux disease)    Improved at this time with Pepcid on board + occasional Reglan, will continue this and will not increase Trulicity further due to increased reflux with this -- although we are unsure if it was related to this of Lamictal increase.  May need to return to insulin use for diabetes in future, although goal is not to do this.  Mag level today.      Relevant Orders   Magnesium     Endocrine   Hyperlipidemia associated with type 2 diabetes mellitus (HCC)    Chronic, ongoing.  Continue Atorvastatin 40 MG daily.  Lipid panel today and adjust dose as needed.       Relevant Medications   metFORMIN (GLUCOPHAGE-XR) 500 MG 24 hr tablet   Other Relevant Orders   Bayer DCA Hb A1c Waived (Completed)   Comprehensive metabolic panel   Lipid Panel w/o Chol/HDL Ratio   Type 2 diabetes mellitus with hyperglycemia, with long-term current use of insulin (HCC) - Primary    Chronic, ongoing with A1C 10.1% today, trending up, was lost to follow-up due to chemo treatment for a little bit.  Previously followed by endo, but prefers not to return at this time. Urine ALB 10 today.  Will continue Jardiance at current dose and increase Trulicity 6.76 MG weekly, as previously 1.5 MG may have worsened reflux, patient is agreeable to restart of Metformin -- will send in XR dosing with goal to return to previous 1000 MG BID.  New glucometer and supplies sent in, with education on how to check.  Recommend monitor BS at home TID and heavily focus on diet changes.   Return in 3 months.      Relevant Medications   metFORMIN (GLUCOPHAGE-XR) 500 MG 24 hr tablet   Other Relevant Orders   Bayer DCA Hb A1c Waived (Completed)   Microalbumin, Urine Waived (Completed)     Nervous and Auditory   Chemotherapy-induced neuropathy (HCC) (Chronic)    Chronic, ongoing.  Continue current pain management collaboration and medication regimen as prescribed by them.      Nicotine dependence, cigarettes, w unsp disorders    I have recommended complete cessation of tobacco use. I have discussed various options available for assistance  with tobacco cessation including over the counter methods (Nicotine gum, patch and lozenges). We also discussed prescription options (Chantix, Nicotine Inhaler / Nasal Spray). The patient is not interested in pursuing any prescription tobacco cessation options at this time.  Recommend we start lung CT screening, she will plan on this next visit as has had a lot of testing recently.         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      Malignant neoplasm of lower-outer quadrant of left breast of female, estrogen receptor positive ([place])    Continue collaboration with oncology, recent note reviewed.      Severe depression (HCC)    Chronic, ongoing.  Continue current medication regimen and collaboration with psychiatry.  Currently denies SI/HI.        Follow up plan: Return in about 3 months (around 05/13/2021) for T2DM, HTN/HLD, GERD, MOOD.

## 2021-02-12 NOTE — Assessment & Plan Note (Signed)
Chronic, ongoing with A1C 10.1% today, trending up, was lost to follow-up due to chemo treatment for a little bit.  Previously followed by endo, but prefers not to return at this time. Urine ALB 10 today.  Will continue Jardiance at current dose and increase Trulicity 9.15 MG weekly, as previously 1.5 MG may have worsened reflux, patient is agreeable to restart of Metformin -- will send in XR dosing with goal to return to previous 1000 MG BID.  New glucometer and supplies sent in, with education on how to check.  Recommend monitor BS at home TID and heavily focus on diet changes.   Return in 3 months.

## 2021-02-12 NOTE — Assessment & Plan Note (Signed)
Followed by pain management, continue this collaboration, recent notes reviewed. 

## 2021-02-12 NOTE — Assessment & Plan Note (Addendum)
Chronic, ongoing.  Continue Atorvastatin 40 MG daily.  Lipid panel today and adjust dose as needed.

## 2021-02-12 NOTE — Assessment & Plan Note (Signed)
I have recommended complete cessation of tobacco use. I have discussed various options available for assistance with tobacco cessation including over the counter methods (Nicotine gum, patch and lozenges). We also discussed prescription options (Chantix, Nicotine Inhaler / Nasal Spray). The patient is not interested in pursuing any prescription tobacco cessation options at this time.  Recommend we start lung CT screening, she will plan on this next visit as has had a lot of testing recently.   

## 2021-02-12 NOTE — Assessment & Plan Note (Signed)
Continue collaboration with oncology, recent note reviewed. 

## 2021-02-12 NOTE — Patient Instructions (Signed)

## 2021-02-12 NOTE — Assessment & Plan Note (Signed)
Improved at this time with Pepcid on board + occasional Reglan, will continue this and will not increase Trulicity further due to increased reflux with this -- although we are unsure if it was related to this of Lamictal increase.  May need to return to insulin use for diabetes in future, although goal is not to do this.  Mag level today.

## 2021-02-12 NOTE — Assessment & Plan Note (Signed)
Chronic, ongoing.  Continue current pain management collaboration and medication regimen as prescribed by them. 

## 2021-02-12 NOTE — Assessment & Plan Note (Signed)
Chronic, ongoing.  Continue current medication regimen and collaboration with psychiatry.  Currently denies SI/HI. 

## 2021-02-13 ENCOUNTER — Other Ambulatory Visit: Payer: Self-pay | Admitting: Nurse Practitioner

## 2021-02-13 LAB — LIPID PANEL W/O CHOL/HDL RATIO
Cholesterol, Total: 223 mg/dL — ABNORMAL HIGH (ref 100–199)
HDL: 52 mg/dL (ref >39)
LDL Chol Calc (NIH): 129 mg/dL — ABNORMAL HIGH (ref 0–99)
Triglycerides: 236 mg/dL — ABNORMAL HIGH (ref 0–149)
VLDL Cholesterol Cal: 42 mg/dL — ABNORMAL HIGH (ref 5–40)

## 2021-02-13 LAB — COMPREHENSIVE METABOLIC PANEL
ALT: 16 IU/L (ref 0–32)
AST: 14 IU/L (ref 0–40)
Albumin/Globulin Ratio: 2 (ref 1.2–2.2)
Albumin: 4.3 g/dL (ref 3.8–4.9)
Alkaline Phosphatase: 90 IU/L (ref 44–121)
BUN/Creatinine Ratio: 13 (ref 9–23)
BUN: 8 mg/dL (ref 6–24)
Bilirubin Total: 0.3 mg/dL (ref 0.0–1.2)
CO2: 24 mmol/L (ref 20–29)
Calcium: 9.5 mg/dL (ref 8.7–10.2)
Chloride: 101 mmol/L (ref 96–106)
Creatinine, Ser: 0.61 mg/dL (ref 0.57–1.00)
Globulin, Total: 2.1 g/dL (ref 1.5–4.5)
Glucose: 247 mg/dL — ABNORMAL HIGH (ref 70–99)
Potassium: 3.7 mmol/L (ref 3.5–5.2)
Sodium: 140 mmol/L (ref 134–144)
Total Protein: 6.4 g/dL (ref 6.0–8.5)
eGFR: 107 mL/min/{1.73_m2} (ref >59)

## 2021-02-13 LAB — MAGNESIUM: Magnesium: 2.1 mg/dL (ref 1.6–2.3)

## 2021-02-13 MED ORDER — ROSUVASTATIN CALCIUM 40 MG PO TABS: 40.0000 mg | ORAL_TABLET | Freq: Every day | ORAL | 4 refills | Status: DC

## 2021-02-13 NOTE — Progress Notes (Signed)
Good morning crew, I do not believe Ashleah checks MyChart often so please let her know labs have returned: - Kidney and liver function remain stable. - Magnesium level is normal. - Cholesterol levels continue to be above goal -- I am going to stop your Atorvastatin and change to Rosuvastatin, please ensure you take this daily.  If ongoing elevation in levels above goal with this change then we may also add on second medication.  Any questions? Keep being stellar!!  Thank you for allowing me to participate in your care.  I appreciate you. Kindest regards, Ulric Salzman

## 2021-02-19 ENCOUNTER — Inpatient Hospital Stay: Payer: No Typology Code available for payment source | Attending: Internal Medicine | Admitting: Internal Medicine

## 2021-02-19 ENCOUNTER — Other Ambulatory Visit: Payer: Self-pay

## 2021-02-19 ENCOUNTER — Encounter: Payer: Self-pay | Admitting: Internal Medicine

## 2021-02-19 VITALS — BP 111/82 | HR 75 | Temp 98.7°F | Resp 19 | Wt 164.7 lb

## 2021-02-19 DIAGNOSIS — M25569 Pain in unspecified knee: Secondary | ICD-10-CM | POA: Insufficient documentation

## 2021-02-19 DIAGNOSIS — Z9221 Personal history of antineoplastic chemotherapy: Secondary | ICD-10-CM | POA: Insufficient documentation

## 2021-02-19 DIAGNOSIS — Z170 Estrogen receptor positive status [ER+]: Secondary | ICD-10-CM | POA: Diagnosis not present

## 2021-02-19 DIAGNOSIS — E785 Hyperlipidemia, unspecified: Secondary | ICD-10-CM | POA: Insufficient documentation

## 2021-02-19 DIAGNOSIS — G620 Drug-induced polyneuropathy: Secondary | ICD-10-CM

## 2021-02-19 DIAGNOSIS — F32A Depression, unspecified: Secondary | ICD-10-CM | POA: Insufficient documentation

## 2021-02-19 DIAGNOSIS — Z833 Family history of diabetes mellitus: Secondary | ICD-10-CM | POA: Diagnosis not present

## 2021-02-19 DIAGNOSIS — Z8379 Family history of other diseases of the digestive system: Secondary | ICD-10-CM | POA: Insufficient documentation

## 2021-02-19 DIAGNOSIS — Z79810 Long term (current) use of selective estrogen receptor modulators (SERMs): Secondary | ICD-10-CM | POA: Insufficient documentation

## 2021-02-19 DIAGNOSIS — Z5111 Encounter for antineoplastic chemotherapy: Secondary | ICD-10-CM | POA: Diagnosis not present

## 2021-02-19 DIAGNOSIS — T451X5A Adverse effect of antineoplastic and immunosuppressive drugs, initial encounter: Secondary | ICD-10-CM

## 2021-02-19 DIAGNOSIS — C50812 Malignant neoplasm of overlapping sites of left female breast: Secondary | ICD-10-CM | POA: Insufficient documentation

## 2021-02-19 DIAGNOSIS — Z803 Family history of malignant neoplasm of breast: Secondary | ICD-10-CM | POA: Insufficient documentation

## 2021-02-19 DIAGNOSIS — Z886 Allergy status to analgesic agent status: Secondary | ICD-10-CM | POA: Insufficient documentation

## 2021-02-19 DIAGNOSIS — Z8042 Family history of malignant neoplasm of prostate: Secondary | ICD-10-CM | POA: Insufficient documentation

## 2021-02-19 DIAGNOSIS — Z923 Personal history of irradiation: Secondary | ICD-10-CM | POA: Diagnosis not present

## 2021-02-19 DIAGNOSIS — M549 Dorsalgia, unspecified: Secondary | ICD-10-CM | POA: Diagnosis not present

## 2021-02-19 DIAGNOSIS — Z79899 Other long term (current) drug therapy: Secondary | ICD-10-CM | POA: Diagnosis not present

## 2021-02-19 DIAGNOSIS — G4700 Insomnia, unspecified: Secondary | ICD-10-CM | POA: Diagnosis not present

## 2021-02-19 DIAGNOSIS — R5383 Other fatigue: Secondary | ICD-10-CM | POA: Diagnosis not present

## 2021-02-19 DIAGNOSIS — Z800 Family history of malignant neoplasm of digestive organs: Secondary | ICD-10-CM | POA: Insufficient documentation

## 2021-02-19 DIAGNOSIS — F17210 Nicotine dependence, cigarettes, uncomplicated: Secondary | ICD-10-CM | POA: Diagnosis not present

## 2021-02-19 DIAGNOSIS — Z90721 Acquired absence of ovaries, unilateral: Secondary | ICD-10-CM | POA: Diagnosis not present

## 2021-02-19 DIAGNOSIS — I890 Lymphedema, not elsewhere classified: Secondary | ICD-10-CM | POA: Insufficient documentation

## 2021-02-19 DIAGNOSIS — R112 Nausea with vomiting, unspecified: Secondary | ICD-10-CM | POA: Diagnosis not present

## 2021-02-19 MED ORDER — AMITRIPTYLINE HCL 50 MG PO TABS: 50.0000 mg | ORAL_TABLET | Freq: Every day | ORAL | 3 refills | Status: DC

## 2021-02-19 MED ORDER — SUMATRIPTAN SUCCINATE 100 MG PO TABS: 100.0000 mg | ORAL_TABLET | ORAL | 0 refills | Status: DC | PRN
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
## 2021-02-22 ENCOUNTER — Inpatient Hospital Stay (HOSPITAL_BASED_OUTPATIENT_CLINIC_OR_DEPARTMENT_OTHER): Payer: No Typology Code available for payment source | Admitting: Internal Medicine

## 2021-02-22 ENCOUNTER — Encounter: Payer: Self-pay | Admitting: Internal Medicine

## 2021-02-22 ENCOUNTER — Inpatient Hospital Stay: Payer: No Typology Code available for payment source

## 2021-02-22 ENCOUNTER — Other Ambulatory Visit: Payer: Self-pay

## 2021-02-22 VITALS — BP 116/75 | HR 88 | Temp 98.1°F | Ht 64.0 in | Wt 166.0 lb

## 2021-02-22 DIAGNOSIS — Z170 Estrogen receptor positive status [ER+]: Secondary | ICD-10-CM | POA: Diagnosis not present

## 2021-02-22 DIAGNOSIS — C50512 Malignant neoplasm of lower-outer quadrant of left female breast: Secondary | ICD-10-CM

## 2021-02-22 DIAGNOSIS — Z5111 Encounter for antineoplastic chemotherapy: Secondary | ICD-10-CM | POA: Diagnosis not present

## 2021-02-22 LAB — CBC WITH DIFFERENTIAL/PLATELET
Abs Immature Granulocytes: 0.02 10*3/uL (ref 0.00–0.07)
Basophils Absolute: 0.1 10*3/uL (ref 0.0–0.1)
Basophils Relative: 1 %
Eosinophils Absolute: 0 10*3/uL (ref 0.0–0.5)
Eosinophils Relative: 0 %
HCT: 44 % (ref 36.0–46.0)
Hemoglobin: 14.6 g/dL (ref 12.0–15.0)
Immature Granulocytes: 0 %
Lymphocytes Relative: 27 %
Lymphs Abs: 2.4 10*3/uL (ref 0.7–4.0)
MCH: 31.9 pg (ref 26.0–34.0)
MCHC: 33.2 g/dL (ref 30.0–36.0)
MCV: 96.3 fL (ref 80.0–100.0)
Monocytes Absolute: 0.5 10*3/uL (ref 0.1–1.0)
Monocytes Relative: 6 %
Neutro Abs: 6.1 10*3/uL (ref 1.7–7.7)
Neutrophils Relative %: 66 %
Platelets: 205 10*3/uL (ref 150–400)
RBC: 4.57 MIL/uL (ref 3.87–5.11)
RDW: 12 % (ref 11.5–15.5)
WBC: 9.1 10*3/uL (ref 4.0–10.5)
nRBC: 0 % (ref 0.0–0.2)

## 2021-02-22 LAB — COMPREHENSIVE METABOLIC PANEL
ALT: 17 U/L (ref 0–44)
AST: 15 U/L (ref 15–41)
Albumin: 4.2 g/dL (ref 3.5–5.0)
Alkaline Phosphatase: 61 U/L (ref 38–126)
Anion gap: 10 (ref 5–15)
BUN: 10 mg/dL (ref 6–20)
CO2: 26 mmol/L (ref 22–32)
Calcium: 9.3 mg/dL (ref 8.9–10.3)
Chloride: 99 mmol/L (ref 98–111)
Creatinine, Ser: 0.53 mg/dL (ref 0.44–1.00)
GFR, Estimated: >60 mL/min (ref >60)
Glucose, Bld: 151 mg/dL — ABNORMAL HIGH (ref 70–99)
Potassium: 3.3 mmol/L — ABNORMAL LOW (ref 3.5–5.1)
Sodium: 135 mmol/L (ref 135–145)
Total Bilirubin: 0.7 mg/dL (ref 0.3–1.2)
Total Protein: 7 g/dL (ref 6.5–8.1)

## 2021-02-22 MED ORDER — ZOLEDRONIC ACID 4 MG/5ML IV CONC: 4.0000 mg | Freq: Once | INTRAVENOUS | Status: DC

## 2021-02-22 MED ORDER — GOSERELIN ACETATE 10.8 MG ~~LOC~~ IMPL
10.8000 mg | DRUG_IMPLANT | Freq: Once | SUBCUTANEOUS | Status: AC
  Administered 2021-02-22: 10.8 mg via SUBCUTANEOUS
  Filled 2021-02-22: qty 10.8

## 2021-02-22 MED ORDER — SODIUM CHLORIDE 0.9 % IV SOLN
INTRAVENOUS | Status: DC
  Filled 2021-02-22: qty 250

## 2021-02-22 MED ORDER — ZOLEDRONIC ACID 4 MG/100ML IV SOLN
4.0000 mg | Freq: Once | INTRAVENOUS | Status: AC
  Administered 2021-02-22: 4 mg via INTRAVENOUS
  Filled 2021-02-22: qty 100

## 2021-02-22 MED ORDER — HEPARIN SOD (PORK) LOCK FLUSH 100 UNIT/ML IV SOLN
INTRAVENOUS | Status: AC
  Administered 2021-02-22: 500 [IU]
  Filled 2021-02-22: qty 5

## 2021-02-22 NOTE — Assessment & Plan Note (Addendum)
#   Breast cancer Stage III ER/PR pos her 2 NEG. currently on Tamoxifen+ Zoldadex.  No clinical evidence of recurrence; STABLE. adjuvant Zometa q6M; Zoladex today q3M.  Mammogram August 2022- WNL.   # Currently on Tamoxifen 10 mg [lower dose; previous intolerance to 20 mg/AI;].   # Depression/ Insomnia--STABLE- On cymblata/Amiytrplitine qhs /Dr.vaslow[DI-none as per uptodate*] Dr.kapur]  # Joint pains-multifactorial- Improved off Lyrica [As per pt ]; Off AI-  # PN-2-3 [Pain doc] on neurontin- improved/STABLE   #Lymphedema-left chest walls/p physical therapy- STABLE  # DM-2 on insulin- STABLE [A1c-6.8]Blood glucose 151-;   Defer to PCP  # Port malfunction: flush; s/p TPA-STABLE.   Zometa q76m; zola 3 m # DISPOSITION: # Zoladex; ZOMETA today # Follow up in 3 month;  MD;cbc/cmp/ca-27-29; Zoladex- port flush. Dr.B

## 2021-02-22 NOTE — Progress Notes (Signed)
[facility] CONSULT NOTE  Patient Care Team: [name], NP as PCP - General (Nurse Practitioner) Bary Castilla [name], MD (General Surgery) [name], [name], [name] (Obstetrics and Gynecology) Cammie Sickle, MD as Consulting Physician (Oncology) Vanita Ingles, RN as Case Manager (General Practice) Lovell Sheehan, MD as Consulting Physician (Orthopedic Surgery)  CHIEF COMPLAINTS/PURPOSE OF CONSULTATION: Breast cancer  Oncology History Overview Note  # MAY 2019-  clinical stage IIIA (T3N1Mx) left breast cancer s/p biopsy on 06/14/2017. -Pathology revealed grade III invasive ductal carcinoma. -Axillary FNA revealed malignant cells c/w metastatic carcinoma. Tumor was ER + (90%), PR + (30%), Her2/neu - and Ki67 70%.  CA27.29 was 7.8 on 06/14/2017.  # She received 4 cycles of AC with Neulasta support (07/20/2017 - 08/31/2017).;  neoadjuvant Taxol on 09/14/2017.  #DEC 2019- Lumpectomy/sentinel lymph node biopsy [Dr.[name]]-complete pathologic response  # s/p RT [delayed sec to wound infection; Dr.[name]] finished RT [4/12]  # April 14th 2020- START TAM; stopped in mid-May secondary intolerance [severe migraines].  # 18th May 2020-start Arimidex [hormonal profile-postmenopausal;add Zoladex q3M]; STOPPED sec intolerance/joint pain; NOV 2021- STARTED AROMASIN; Stopped x 2 months sec to extreme fatigue/severe joint pains.   # July 2022- START tamoxifen 10 mg a day.  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 Secord/Berchuck]-negative for malignancy/ # DM-2- poorly controlled.   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   # PAP SMEAR- RECOMMENDED 2022- summer  -------------------------------------------  DIAGNOSIS: left breast cancer  STAGE:  III       ;GOALS: cure  CURRENT/MOST RECENT THERAPY Tam    Cancer  of midline of breast, left (HCC) (Resolved)  06/15/2017 Initial Diagnosis   Cancer of midline of breast, left ([place])   07/20/2017 - 11/16/2017 Chemotherapy   The patient had dexamethasone (DECADRON) 4 MG tablet, 1 of 1 cycle, Start date: --, End date: -- DOXOrubicin (ADRIAMYCIN) chemo injection 122 mg, 60 mg/m2 = 122 mg, Intravenous,  Once, 4 of 4 cycles Administration: 122 mg (07/20/2017), 122 mg (08/03/2017), 122 mg (08/17/2017), 122 mg (08/31/2017) palonosetron (ALOXI) injection 0.25 mg, 0.25 mg, Intravenous,  Once, 4 of 4 cycles Administration: 0.25 mg (07/20/2017), 0.25 mg (08/03/2017), 0.25 mg (08/17/2017), 0.25 mg (08/31/2017) pegfilgrastim (NEULASTA) injection 6 mg, 6 mg, Subcutaneous, Once, 5 of 5 cycles Administration: 6 mg (07/21/2017), 6 mg (08/04/2017), 6 mg (08/18/2017), 6 mg (09/01/2017) cyclophosphamide (CYTOXAN) 1,220 mg in sodium chloride 0.9 % 250 mL chemo infusion, 600 mg/m2 = 1,220 mg, Intravenous,  Once, 4 of 4 cycles Administration: 1,220 mg (07/20/2017), 1,220 mg (08/03/2017), 1,220 mg (08/17/2017), 1,220 mg (08/31/2017) PACLitaxel (TAXOL) 162 mg in sodium chloride 0.9 % 250 mL chemo infusion (</= $RemoveBefor'80mg'UQdtANkOpbbd$ /m2), 80 mg/m2 = 162 mg, Intravenous,  Once, 10 of 12 cycles Dose modification: 65 mg/m2 (original dose 80 mg/m2, Cycle 13, Reason: Provider Judgment, Comment: neuropathy) Administration: 162 mg (09/14/2017), 162 mg (09/21/2017), 162 mg (09/28/2017), 162 mg (10/05/2017), 162 mg (10/12/2017), 162 mg (10/19/2017), 162 mg (10/26/2017), 132 mg (11/02/2017), 132 mg (11/09/2017), 132 mg (11/16/2017) fosaprepitant (EMEND) 150 mg, dexamethasone (DECADRON) 12 mg in sodium chloride 0.9 % 145 mL IVPB, , Intravenous,  Once, 4 of 4 cycles Administration:  (07/20/2017),  (08/03/2017),  (08/17/2017),  (08/31/2017)   for chemotherapy treatment.     Malignant neoplasm of lower-outer quadrant of left breast of female, estrogen receptor positive ([place])  11/08/2017  Initial Diagnosis   Malignant neoplasm of lower-outer quadrant  of left breast of female, estrogen receptor positive ([place])    HISTORY OF PRESENTING ILLNESS: Ambulating independently.  Alone.  Carolyn Roy 56 y.o.  female with a history of stage III ER PR positive HER-2/neu negative breast cancer currently on tamoxifen 10 mg a day [intolerance] plus Zoladexq3 /Zometa every 6 months is here for follow-up.  In the interim patient was evaluated by neurology for a brain MRI that was slightly abnormal-question old hematoma versus metastatic disease.   Patient has been started on amitriptyline; by neurology.  She continues to be on Cymbalta.  She was taken off Lamictal for nausea/vomiting.  No new lumps or bumps.  Review of Systems  Constitutional:  Positive for malaise/fatigue. Negative for chills, diaphoresis, fever and weight loss.  HENT:  Negative for nosebleeds and sore throat.   Eyes:  Negative for double vision.  Respiratory:  Negative for cough, hemoptysis, sputum production, shortness of breath and wheezing.   Cardiovascular:  Negative for chest pain, palpitations, orthopnea and leg swelling.  Gastrointestinal:  Negative for abdominal pain, blood in stool, constipation, diarrhea, heartburn, melena, nausea and vomiting.  Genitourinary:  Negative for dysuria, frequency and urgency.  Musculoskeletal:  Positive for back pain and joint pain.  Skin: Negative.  Negative for itching and rash.  Neurological:  Positive for tingling. Negative for dizziness, focal weakness, weakness and headaches.  Endo/Heme/Allergies:  Does not bruise/bleed easily.  Psychiatric/Behavioral:  Negative for depression. The patient is not nervous/anxious and does not have insomnia.     MEDICAL HISTORY:  Past Medical History:  Diagnosis Date   Allergy    Breast cancer ([place])    Cancer ([place]) 06/15/2017   5.1 cm, T3,N1 (clinical): ER/ PR positive, Her 2 neu not overexpressed, High Ki 67. Neuoadjuvant chemotherapy.    Depression    Diabetes mellitus without complication  ([place]) 0092   Edema of left upper extremity    Endometriosis    Family history of breast cancer    Headache    migraines   Hyperlipidemia    Lymphedema of left arm    Ovarian mass    Personal history of chemotherapy    Personal history of radiation therapy    Pneumonia    2018    SURGICAL HISTORY: Past Surgical History:  Procedure Laterality Date   AXILLARY LYMPH NODE BIOPSY Left 07/14/2017   Procedure: INSERTION GEL MARK CLIP LEFT AXILLA;  Surgeon: Robert Bellow, MD;  Location: [facility];  Service: General;  Laterality: Left;   BREAST BIOPSY Left    Dr Orlene Och BREAST METASTATIC CARCINOMA   BREAST LUMPECTOMY Left 01/12/2018   COLONOSCOPY WITH PROPOFOL N/A 12/27/2019   Procedure: COLONOSCOPY WITH PROPOFOL;  Surgeon: Virgel Manifold, MD;  Location: [facility];  Service: Endoscopy;  Laterality: N/A;   OOPHORECTOMY     PARTIAL MASTECTOMY WITH NEEDLE LOCALIZATION Left 01/12/2018   Procedure: PARTIAL MASTECTOMY WITH NEEDLE LOCALIZATION;  Surgeon: Robert Bellow, MD;  Location: [facility];  Service: General;  Laterality: Left;   PORTACATH PLACEMENT Right 07/14/2017   Procedure: INSERTION PORT-A-CATH;  Surgeon: Robert Bellow, MD;  Location: [facility];  Service: General;  Laterality: Right;   SENTINEL NODE BIOPSY Left 01/12/2018   Procedure: SENTINEL NODE BIOPSY;  Surgeon: Robert Bellow, MD;  Location: [facility];  Service: General;  Laterality: Left;   TUBAL LIGATION      SOCIAL HISTORY: Social History   Socioeconomic History   Marital status:  Married    Spouse name: Not on file   Number of children: Not on file   Years of education: Not on file   Highest education level: Not on file  Occupational History   Not on file  Tobacco Use   Smoking status: Every Day    Packs/day: 1.00    Years: 11.00    Pack years: 11.00    Types: Cigarettes   Smokeless tobacco: Former    Types: Snuff  Vaping Use   Vaping Use: Never used  Substance and Sexual Activity    Alcohol use: No    Alcohol/week: 0.0 standard drinks   Drug use: No   Sexual activity: Yes  Other Topics Concern   Not on file  Social History Narrative   Not on file   Social Determinants of Health   Financial Resource Strain: Not on file  Food Insecurity: Not on file  Transportation Needs: Not on file  Physical Activity: Not on file  Stress: Not on file  Social Connections: Not on file  Intimate Partner Violence: Not on file    FAMILY HISTORY: Family History  Problem Relation Age of Onset   Colon cancer Mother    Cancer Mother    Other Father        No info about father or paternal relatives   Diabetes Brother    Pancreatitis Brother    Prostate cancer Brother 36       currently 20 / maternal half-brother   Breast cancer Maternal Grandmother 14       deceased 51s   Colon cancer Maternal Grandmother    Breast cancer Maternal Aunt 69       currently 16   Breast cancer Other 37       mother's sister; deceased 63   Breast cancer Other        mother's sister; age at dx unknown    ALLERGIES:  is allergic to aspirin.  MEDICATIONS:  Current Outpatient Medications  Medication Sig Dispense Refill   amitriptyline (ELAVIL) 50 MG tablet Take 1 tablet (50 mg total) by mouth at bedtime. 30 tablet 3   aspirin-acetaminophen-caffeine (EXCEDRIN MIGRAINE) 250-250-65 MG tablet Take 2 tablets by mouth daily as needed for headache.      Blood Glucose Monitoring Suppl (ONETOUCH VERIO) w/Device KIT Use to check blood sugar 3 times a day and document results, bring to appointments.  Goal is <130 fasting blood sugar and <180 two hours after meals. 1 kit 0   Dulaglutide (TRULICITY) 7.07 EM/7.5QG SOPN Inject 0.75 mg into the skin once a week. 0.5 mL 4   DULoxetine (CYMBALTA) 60 MG capsule Take 60 mg by mouth daily.     famotidine (PEPCID) 20 MG tablet TAKE 1 TABLET BY MOUTH EVERY DAY 30 tablet 2   glucose blood test strip Use to check blood sugar 3 times daily, fasting in morning with goal  <130 and 2 hours after meals with goal <180.  Bring blood sugar log to visits. 100 each 12   goserelin (ZOLADEX) 3.6 MG injection Inject 3.6 mg into the skin every 28 (twenty-eight) days.     Insulin Pen Needle 31G X 8 MM MISC Use daily to administer insulin 100 each 12   JARDIANCE 25 MG TABS tablet TAKE 1 TABLET BY MOUTH EVERY DAY 90 tablet 4   metFORMIN (GLUCOPHAGE-XR) 500 MG 24 hr tablet Start by taking one tablet (500 MG) twice a day with meals and then increase in one week to  two tablets (1000 MG) twice a day with meals. 360 tablet 4   metoCLOPramide (REGLAN) 5 MG tablet Take 1 tablet (5 mg total) by mouth 4 (four) times daily. 56 tablet 0   oxyCODONE (OXY IR/ROXICODONE) 5 MG immediate release tablet Take 1 tablet (5 mg total) by mouth 2 (two) times daily as needed for severe pain. Must last 30 days 60 tablet 0   rosuvastatin (CRESTOR) 40 MG tablet Take 1 tablet (40 mg total) by mouth daily. Stop taking Atorvastatin. 90 tablet 4   SUMAtriptan (IMITREX) 100 MG tablet Take 1 tablet (100 mg total) by mouth every 2 (two) hours as needed for migraine. May repeat in 2 hours if headache persists or recurs. 10 tablet 0   tamoxifen (NOLVADEX) 10 MG tablet TAKE 1 TABLET BY MOUTH EVERY DAY 90 tablet 1   Vitamin D, Ergocalciferol, (DRISDOL) 1.25 MG (50000 UNIT) CAPS capsule TAKE 1 CAPSULE BY MOUTH ONE TIME PER WEEK 12 capsule 1   oxyCODONE (OXY IR/ROXICODONE) 5 MG immediate release tablet Take 1 tablet (5 mg total) by mouth 2 (two) times daily as needed for severe pain. Must last 30 days 60 tablet 0   oxyCODONE (OXY IR/ROXICODONE) 5 MG immediate release tablet Take 1 tablet (5 mg total) by mouth 2 (two) times daily as needed for severe pain. Must last 30 days 60 tablet 0   traZODone (DESYREL) 50 MG tablet Take 100 mg by mouth at bedtime as needed. (Patient not taking: Reported on 02/22/2021)     No current facility-administered medications for this visit.   Facility-Administered Medications Ordered in Other  Visits  Medication Dose Route Frequency Provider Last Rate Last Admin   0.9 %  sodium chloride infusion   Intravenous Continuous Rogue Bussing, Elisha Headland, MD       goserelin (ZOLADEX) injection 10.8 mg  10.8 mg Subcutaneous Once Charlaine Dalton R, MD       heparin lock flush 100 unit/mL  500 Units Intravenous Once Corcoran, Melissa C, MD       sodium chloride flush (NS) 0.9 % injection 10 mL  10 mL Intravenous Once Lequita Asal, MD       zoledronic acid (ZOMETA) 4 mg in sodium chloride 0.9 % 100 mL IVPB  4 mg Intravenous Once Cammie Sickle, MD          .  PHYSICAL EXAMINATION: ECOG PERFORMANCE STATUS: 0 - Asymptomatic  Vitals:   02/22/21 1313  BP: 116/75  Pulse: 88  Temp: 98.1 F (36.7 C)  SpO2: 97%   Filed Weights   02/22/21 1313  Weight: 166 lb (75.3 kg)    Physical Exam HENT:     Head: Normocephalic and atraumatic.     Mouth/Throat:     Pharynx: No oropharyngeal exudate.  Eyes:     Pupils: Pupils are equal, round, and reactive to light.  Cardiovascular:     Rate and Rhythm: Normal rate and regular rhythm.  Pulmonary:     Effort: No respiratory distress.     Breath sounds: No wheezing.  Abdominal:     General: Bowel sounds are normal. There is no distension.     Palpations: Abdomen is soft. There is no mass.     Tenderness: There is no abdominal tenderness. There is no guarding or rebound.  Musculoskeletal:        General: No tenderness. Normal range of motion.     Cervical back: Normal range of motion and neck supple.  Skin:  General: Skin is warm.     Comments: Right and left BREAST exam [in the presence of nurse]- no unusual skin changes or dominant masses felt. Surgical scars noted.    Neurological:     Mental Status: She is alert and oriented to person, place, and time.  Psychiatric:        Mood and Affect: Affect normal.     LABORATORY DATA:  I have reviewed the data as listed Lab Results  Component Value Date   WBC 9.1  02/22/2021   HGB 14.6 02/22/2021   HCT 44.0 02/22/2021   MCV 96.3 02/22/2021   PLT 205 02/22/2021   Recent Labs    10/21/20 1252 12/01/20 1834 12/21/20 1509 02/12/21 1508 02/22/21 1259  NA 140 139 140 140 135  K 4.4 4.1 4.2 3.7 3.3*  CL 104 105 99 101 99  CO2 _0 GLUCOSE 252* 264* 261* 247* 151*  BUN _1 CREATININE 0.88 0.59 0.58 0.61 0.53  CALCIUM 9.0 9.3 9.5 9.5 9.3  GFRNONAA >60 >60  --   --  >60  PROT 6.5 7.5 6.2 6.4 7.0  ALBUMIN 3.6 4.1 4.2 4.3 4.2  AST _2 ALT _3 ALKPHOS 57 60 73 90 61  BILITOT 0.4 0.7 0.3 0.3 0.7    RADIOGRAPHIC STUDIES: I have personally reviewed the radiological images as listed and agreed with the findings in the report. No results found.  ASSESSMENT & PLAN:   Malignant neoplasm of lower-outer quadrant of left breast of female, estrogen receptor positive ([place]) # Breast cancer Stage III ER/PR pos her 2 NEG. currently on Tamoxifen+ Zoldadex.  No clinical evidence of recurrence; STABLE. adjuvant Zometa q6M; Zoladex today q3M.  Mammogram August 2022- WNL.   # Currently on Tamoxifen 10 mg [lower dose; previous intolerance to 20 mg/AI;].   # Depression/ Insomnia--STABLE- On cymblata/Amiytrplitine qhs /Dr.vaslow[DI-none as per uptodate*] Dr.kapur]  # Joint pains-multifactorial- Improved off Lyrica [As per pt ]; Off AI-  # PN-2-3 [Pain doc] on neurontin- improved/STABLE   #Lymphedema-left chest walls/p physical therapy- STABLE  # DM-2 on insulin- STABLE [A1c-6.8]Blood glucose 151-;   Defer to PCP  # Port malfunction: flush; s/p TPA-STABLE.   Zometa q39m zola 3 m # DISPOSITION: # Zoladex; ZOMETA today # Follow up in 3 month;  MD;cbc/cmp/ca-27-29; Zoladex- port flush. Dr.B  All questions were answered. The patient knows to call the clinic with any problems, questions or concerns.    GCammie Sickle MD 02/22/2021 1:37 PM

## 2021-02-22 NOTE — Patient Instructions (Addendum)
Goserelin injection °What is this medication? °GOSERELIN (GOE se rel in) is similar to a hormone found in the body. It lowers the amount of sex hormones that the body makes. Men will have lower testosterone levels and women will have lower estrogen levels while taking this medicine. In men, this medicine is used to treat prostate cancer; the injection is either given once per month or once every 12 weeks. A once per month injection (only) is used to treat women with endometriosis, dysfunctional uterine bleeding, or advanced breast cancer. °This medicine may be used for other purposes; ask your health care provider or pharmacist if you have questions. °COMMON BRAND NAME(S): Zoladex, Zoladex 3-Month °What should I tell my care team before I take this medication? °They need to know if you have any of these conditions: °bone problems °diabetes °heart disease °history of irregular heartbeat °an unusual or allergic reaction to goserelin, other medicines, foods, dyes, or preservatives °pregnant or trying to get pregnant °breast-feeding °How should I use this medication? °This medicine is for injection under the skin. It is given by a health care professional in a hospital or clinic setting. °Talk to your pediatrician regarding the use of this medicine in children. Special care may be needed. °Overdosage: If you think you have taken too much of this medicine contact a poison control center or emergency room at once. °NOTE: This medicine is only for you. Do not share this medicine with others. °What if I miss a dose? °It is important not to miss your dose. Call your doctor or health care professional if you are unable to keep an appointment. °What may interact with this medication? °Do not take this medicine with any of the following medications: °cisapride °dronedarone °pimozide °thioridazine °This medicine may also interact with the following medications: °other medicines that prolong the QT interval (an abnormal heart  rhythm) °This list may not describe all possible interactions. Give your health care provider a list of all the medicines, herbs, non-prescription drugs, or dietary supplements you use. Also tell them if you smoke, drink alcohol, or use illegal drugs. Some items may interact with your medicine. °What should I watch for while using this medication? °Visit your doctor or health care provider for regular checks on your progress. Your symptoms may appear to get worse during the first weeks of this therapy. Tell your doctor or healthcare provider if your symptoms do not start to get better or if they get worse after this time. °Your bones may get weaker if you take this medicine for a long time. If you smoke or frequently drink alcohol you may increase your risk of bone loss. A family history of osteoporosis, chronic use of drugs for seizures (convulsions), or corticosteroids can also increase your risk of bone loss. Talk to your doctor about how to keep your bones strong. °This medicine should stop regular monthly menstruation in women. Tell your doctor if you continue to menstruate. °Women should not become pregnant while taking this medicine or for 12 weeks after stopping this medicine. Women should inform their doctor if they wish to become pregnant or think they might be pregnant. There is a potential for serious side effects to an unborn child. Talk to your health care professional or pharmacist for more information. Do not breast-feed an infant while taking this medicine. °Men should inform their doctors if they wish to father a child. This medicine may lower sperm counts. Talk to your health care professional or pharmacist for more information. °This   medicine may increase blood sugar. Ask your healthcare provider if changes in diet or medicines are needed if you have diabetes. What side effects may I notice from receiving this medication? Side effects that you should report to your doctor or health care  professional as soon as possible: allergic reactions like skin rash, itching or hives, swelling of the face, lips, or tongue bone pain breathing problems changes in vision chest pain feeling faint or lightheaded, falls fever, chills pain, swelling, warmth in the leg pain, tingling, numbness in the hands or feet signs and symptoms of high blood sugar such as being more thirsty or hungry or having to urinate more than normal. You may also feel very tired or have blurry vision signs and symptoms of low blood pressure like dizziness; feeling faint or lightheaded, falls; unusually weak or tired stomach pain swelling of the ankles, feet, hands trouble passing urine or change in the amount of urine unusually high or low blood pressure unusually weak or tired Side effects that usually do not require medical attention (report to your doctor or health care professional if they continue or are bothersome): change in sex drive or performance changes in breast size in both males and females changes in emotions or moods headache hot flashes irritation at site where injected loss of appetite skin problems like acne, dry skin vaginal dryness This list may not describe all possible side effects. Call your doctor for medical advice about side effects. You may report side effects to FDA at 1-800-FDA-1088. Where should I keep my medication? This drug is given in a hospital or clinic and will not be stored at home. NOTE: This sheet is a summary. It may not cover all possible information. If you have questions about this medicine, talk to your doctor, pharmacist, or health care provider.  2022 Elsevier/Gold Standard (2018-05-25 00:00:00) Zoledronic Acid Injection (Hypercalcemia, Oncology) What is this medication? ZOLEDRONIC ACID (ZOE le dron ik AS id) slows calcium loss from bones. It high calcium levels in the blood from some kinds of cancer. It may be used in other people at risk for bone loss. This  medicine may be used for other purposes; ask your health care provider or pharmacist if you have questions. COMMON BRAND NAME(S): Zometa What should I tell my care team before I take this medication? They need to know if you have any of these conditions: cancer dehydration dental disease kidney disease liver disease low levels of calcium in the blood lung or breathing disease (asthma) receiving steroids like dexamethasone or prednisone an unusual or allergic reaction to zoledronic acid, other medicines, foods, dyes, or preservatives pregnant or trying to get pregnant breast-feeding How should I use this medication? This drug is injected into a vein. It is given by a health care provider in a hospital or clinic setting. Talk to your health care provider about the use of this drug in children. Special care may be needed. Overdosage: If you think you have taken too much of this medicine contact a poison control center or emergency room at once. NOTE: This medicine is only for you. Do not share this medicine with others. What if I miss a dose? Keep appointments for follow-up doses. It is important not to miss your dose. Call your health care provider if you are unable to keep an appointment. What may interact with this medication? certain antibiotics given by injection NSAIDs, medicines for pain and inflammation, like ibuprofen or naproxen some diuretics like bumetanide, furosemide teriparatide thalidomide This  list may not describe all possible interactions. Give your health care provider a list of all the medicines, herbs, non-prescription drugs, or dietary supplements you use. Also tell them if you smoke, drink alcohol, or use illegal drugs. Some items may interact with your medicine. What should I watch for while using this medication? Visit your health care provider for regular checks on your progress. It may be some time before you see the benefit from this drug. Some people who take  this drug have severe bone, joint, or muscle pain. This drug may also increase your risk for jaw problems or a broken thigh bone. Tell your health care provider right away if you have severe pain in your jaw, bones, joints, or muscles. Tell you health care provider if you have any pain that does not go away or that gets worse. Tell your dentist and dental surgeon that you are taking this drug. You should not have major dental surgery while on this drug. See your dentist to have a dental exam and fix any dental problems before starting this drug. Take good care of your teeth while on this drug. Make sure you see your dentist for regular follow-up appointments. You should make sure you get enough calcium and vitamin D while you are taking this drug. Discuss the foods you eat and the vitamins you take with your health care provider. Check with your health care provider if you have severe diarrhea, nausea, and vomiting, or if you sweat a lot. The loss of too much body fluid may make it dangerous for you to take this drug. You may need blood work done while you are taking this drug. Do not become pregnant while taking this drug. Women should inform their health care provider if they wish to become pregnant or think they might be pregnant. There is potential for serious harm to an unborn child. Talk to your health care provider for more information. What side effects may I notice from receiving this medication? Side effects that you should report to your doctor or health care provider as soon as possible: allergic reactions (skin rash, itching or hives; swelling of the face, lips, or tongue) bone pain infection (fever, chills, cough, sore throat, pain or trouble passing urine) jaw pain, especially after dental work joint pain kidney injury (trouble passing urine or change in the amount of urine) low blood pressure (dizziness; feeling faint or lightheaded, falls; unusually weak or tired) low calcium levels  (fast heartbeat; muscle cramps or pain; pain, tingling, or numbness in the hands or feet; seizures) low magnesium levels (fast, irregular heartbeat; muscle cramp or pain; muscle weakness; tremors; seizures) low red blood cell counts (trouble breathing; feeling faint; lightheaded, falls; unusually weak or tired) muscle pain redness, blistering, peeling, or loosening of the skin, including inside the mouth severe diarrhea swelling of the ankles, feet, hands trouble breathing Side effects that usually do not require medical attention (report to your doctor or health care provider if they continue or are bothersome): anxious constipation coughing depressed mood eye irritation, itching, or pain fever general ill feeling or flu-like symptoms nausea pain, redness, or irritation at site where injected trouble sleeping This list may not describe all possible side effects. Call your doctor for medical advice about side effects. You may report side effects to FDA at 1-800-FDA-1088. Where should I keep my medication? This drug is given in a hospital or clinic. It will not be stored at home. NOTE: This sheet is a summary. It may  not cover all possible information. If you have questions about this medicine, talk to your doctor, pharmacist, or health care provider.  2022 Elsevier/Gold Standard (2020-10-13 00:00:00) [facility]  Discharge Instructions: Thank you for choosing [facility] to provide your oncology and hematology care.  If you have a lab appointment with the [facility], please go directly to the [facility] and check in at the registration area.  Wear comfortable clothing and clothing appropriate for easy access to any Portacath or PICC line.   We strive to give you quality time with your provider. You may need to reschedule your appointment if you arrive late (15 or more minutes).  Arriving late affects you and other patients whose  appointments are after yours.  Also, if you miss three or more appointments without notifying the office, you may be dismissed from the clinic at the providers discretion.      For prescription refill requests, have your pharmacy contact our office and allow 72 hours for refills to be completed.    Today you received the following chemotherapy and/or immunotherapy agents Zoladex, Zometa      To help prevent nausea and vomiting after your treatment, we encourage you to take your nausea medication as directed.  BELOW ARE SYMPTOMS THAT SHOULD BE REPORTED IMMEDIATELY: *FEVER GREATER THAN 100.4 F (38 C) OR HIGHER *CHILLS OR SWEATING *NAUSEA AND VOMITING THAT IS NOT CONTROLLED WITH YOUR NAUSEA MEDICATION *UNUSUAL SHORTNESS OF BREATH *UNUSUAL BRUISING OR BLEEDING *URINARY PROBLEMS (pain or burning when urinating, or frequent urination) *BOWEL PROBLEMS (unusual diarrhea, constipation, pain near the anus) TENDERNESS IN MOUTH AND THROAT WITH OR WITHOUT PRESENCE OF ULCERS (sore throat, sores in mouth, or a toothache) UNUSUAL RASH, SWELLING OR PAIN  UNUSUAL VAGINAL DISCHARGE OR ITCHING   Items with * indicate a potential emergency and should be followed up as soon as possible or go to the Emergency Department if any problems should occur.  Please show the CHEMOTHERAPY ALERT CARD or IMMUNOTHERAPY ALERT CARD at check-in to the Emergency Department and triage nurse.  Should you have questions after your visit or need to cancel or reschedule your appointment, please contact [facility] [place] [facility] [place]  [phone] and follow the prompts.  Office hours are 8:00 a.m. to 4:30 p.m. Monday - Friday. Please note that voicemails left after 4:00 p.m. may not be returned until the following business day.  We are closed weekends and major holidays. You have access to a nurse at all times for urgent questions. Please call the main number to the clinic [phone] and follow the prompts.  For any  non-urgent questions, you may also contact your provider using MyChart. We now offer e-Visits for anyone 38 and older to request care online for non-urgent symptoms. For details visit [email or web address].   Also download the MyChart app! Go to the app store, search "MyChart", open the app, select [facility], and log in with your MyChart username and password.  Due to Covid, a mask is required upon entering the hospital/clinic. If you do not have a mask, one will be given to you upon arrival. For doctor visits, patients may have 1 support person aged 79 or older with them. For treatment visits, patients cannot have anyone with them due to current Covid guidelines and our immunocompromised population.

## 2021-02-23 ENCOUNTER — Telehealth: Payer: Self-pay | Admitting: *Deleted

## 2021-02-23 ENCOUNTER — Telehealth: Payer: Self-pay

## 2021-02-23 LAB — CA 27.29 (SERIAL MONITOR): CA 27.29: 10.4 U/mL (ref 0.0–38.6)

## 2021-02-23 NOTE — Telephone Encounter (Signed)
we received a referral for a in lab sleep study. Our office only offers home sleep test. [facility] does all in lab studies.  Message sent to ordering provider via epic secure chat.

## 2021-02-23 NOTE — Telephone Encounter (Signed)
Routed referral for sleep study to [facility]

## 2021-03-01 ENCOUNTER — Telehealth: Payer: Self-pay

## 2021-03-01 ENCOUNTER — Other Ambulatory Visit
Admission: RE | Admit: 2021-03-01 | Discharge: 2021-03-01 | Disposition: A | Discharge status: Home / Self Care | Payer: No Typology Code available for payment source | Source: Ambulatory Visit | Attending: Internal Medicine | Admitting: Internal Medicine

## 2021-03-01 ENCOUNTER — Other Ambulatory Visit: Payer: Self-pay

## 2021-03-01 DIAGNOSIS — Z01812 Encounter for preprocedural laboratory examination: Secondary | ICD-10-CM | POA: Diagnosis not present

## 2021-03-01 DIAGNOSIS — Z20822 Contact with and (suspected) exposure to covid-19: Secondary | ICD-10-CM | POA: Diagnosis not present

## 2021-03-01 LAB — SARS CORONAVIRUS 2 (TAT 6-24 HRS): SARS Coronavirus 2: NEGATIVE

## 2021-03-01 NOTE — Telephone Encounter (Signed)
Pt. Asking where to get COVID 19 test for sleep study. [facility] in the practice reports pt. Needs to go to pre-admission testing in the hospital. Pt. Verbalizes understanding.

## 2021-03-02 NOTE — Progress Notes (Signed)
PROVIDER NOTE: Information contained herein reflects review and annotations entered in association with encounter. Interpretation of such information and data should be left to medically-trained personnel. Information provided to patient can be located elsewhere in the medical record under "Patient Instructions". Document created using STT-dictation technology, any transcriptional errors that may result from process are unintentional.    Patient: Carolyn Roy  Service Category: E/M  Provider: Gaspar Cola, MD  DOB: 01/29/1966  DOS: 03/03/2021  Specialty: Interventional Pain Management  MRN: [id number]  Setting: Ambulatory outpatient  PCP: [name], NP  Type: Established Patient    Referring Provider: [name], NP  Location: Office  Delivery: Face-to-face     HPI  Ms. TAKYIA SINDT, a 56 y.o. year old female, is here today because of her Chronic pain syndrome [G89.4]. [name] primary complain today is Knee Pain (right) Last encounter: My last encounter with her was on 11/18/2020. Pertinent problems: [name] has Chronic fatigue; Family history of breast cancer; Chemotherapy-induced neuropathy ([place]); Malignant neoplasm of lower-outer quadrant of left breast of female, estrogen receptor positive ([place]); Chronic feet pain (1ry area of Pain) (Bilateral) (R>L); Neuropathic pain of feet (Bilateral); Chronic knee pain (2ry area of Pain) (Bilateral) (R>L); Chronic hand pain (3ry area of Pain) (Bilateral) (R>L); Chronic pain syndrome; Chronic hip pain (4th area of Pain) (Bilateral) (R>L); Chronic upper extremity pain (3ry area of Pain) (Bilateral) (R>L); Cancer-related pain; Neuropathic pain; Osteoarthritis of knee (Right); Chronic low back pain (Bilateral (L>R) w/o sciatica; Chronic hip pain (Left); Lumbar facet syndrome (Bilateral) (L>R); Idiopathic scoliosis; Chronic sacroiliac joint pain (Left); Chronic pain of knee (Right); Abnormal MRI, lumbar spine (04/20/2018); Lumbar facet  arthropathy; Spondylosis without myelopathy or radiculopathy, lumbosacral region; DDD (degenerative disc disease), lumbosacral; Numbness and tingling of upper extremity (C6/C7 dermatomes) (Right); Personal history of breast cancer; Cervicalgia; Cervical radiculitis (Right); Effusion of knee joint (Right); Tricompartment osteoarthritis of knee (Right); 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 Pain Assessment: Severity of Chronic pain is reported as a 3 /10. Location: Knee Right/Denies. Onset: More than a month ago. Quality: Burning, Dull, Constant. Timing: Constant. Modifying factor(s): heat and massge pad, meds, elevate her right leg. Vitals:  height is '5\' 4"'  (1.626 m) and weight is 164 lb (74.4 kg). Her temperature is 97 F (36.1 C) (abnormal). Her blood pressure is 129/88 and her pulse is 90. Her oxygen saturation is 99%.   Reason for encounter: medication management.   The patient indicates doing well with the current medication regimen. No adverse reactions or side effects reported to the medications.   RTCB: 06/04/2021  Pharmacotherapy Assessment  Analgesic: Oxycodone IR 5 mg, 1 tab PO BID (10 mg/day of oxycodone) (Average: 2 tabs/day = 10 mg/day of oxycodone) MME/day: 15 mg/day.   Monitoring:  PMP: PDMP reviewed during this encounter.       Pharmacotherapy: No side-effects or adverse reactions reported. Compliance: No problems identified. Effectiveness: Clinically acceptable.  Chauncey Fischer, RN  03/03/2021  2:45 PM  Sign when Signing Visit Nursing Pain Medication Assessment:  Safety precautions to be maintained throughout the outpatient stay will include: orient to surroundings, keep bed in low position, maintain call bell within reach at all times, provide assistance with transfer out of bed and ambulation.  Medication Inspection Compliance: Pill  count conducted under aseptic conditions, in front of the patient. Neither the pills  nor the bottle was removed from the patient's sight at any time. Once count was completed pills were immediately returned to the patient in their original bottle.  Medication: Oxycodone IR Pill/Patch Count:  18 of 60 pills remain Pill/Patch Appearance: Markings consistent with prescribed medication Bottle Appearance: Standard pharmacy container. Clearly labeled. Filled Date: 1 / 3 / 2023 Last Medication intake:  TodaySafety precautions to be maintained throughout the outpatient stay will include: orient to surroundings, keep bed in low position, maintain call bell within reach at all times, provide assistance with transfer out of bed and ambulation.     UDS:  Summary  Date Value Ref Range Status  07/27/2020 Note  Final    Comment:    ==================================================================== [employer] (MW) ==================================================================== Test                             Result       Flag       Units  Drug Present and Declared for Prescription Verification   Oxycodone                      491          EXPECTED   ng/mg creat   Oxymorphone                    228          EXPECTED   ng/mg creat   Noroxycodone                   1816         EXPECTED   ng/mg creat   Noroxymorphone                 96           EXPECTED   ng/mg creat    Sources of oxycodone are scheduled prescription medications.    Oxymorphone, noroxycodone, and noroxymorphone are expected    metabolites of oxycodone. Oxymorphone is also available as a    scheduled prescription medication.  ==================================================================== Test                      Result    Flag   Units      Ref Range   Creatinine              57               mg/dL      >=20 ==================================================================== Declared Medications:  The flagging  and interpretation on this report are based on the  following declared medications.  Unexpected results may arise from  inaccuracies in the declared medications.   **Note: The testing scope of this panel includes these medications:   Oxycodone (Roxicodone)   **Note: The testing scope of this panel does not include the  following reported medications:   Acetaminophen (Excedrin)  Alpha Lipoic Acid (Alpha Lipoic Acid)  Aspirin (Excedrin)  Atorvastatin (Lipitor)  Caffeine (Excedrin)  Dulaglutide (Trulicity)  Duloxetine (Cymbalta)  Empagliflozin (Jardiance)  Goserelin  Insulin (Basaglar)  Pregabalin (Lyrica)  Sertraline (Zoloft)  Tamoxifen (Nolvadex)  Trazodone (Desyrel)  Vitamin D2 (Drisdol) ==================================================================== For clinical consultation, please call [phone]. ====================================================================      ROS  Constitutional: Denies any fever or chills Gastrointestinal: No reported hemesis, hematochezia, vomiting, or acute GI distress Musculoskeletal: Denies any acute onset joint swelling, redness, loss of  ROM, or weakness Neurological: No reported episodes of acute onset apraxia, aphasia, dysarthria, agnosia, amnesia, paralysis, loss of coordination, or loss of consciousness  Medication Review  DULoxetine, Dulaglutide, Insulin Pen Needle, OneTouch Verio, SUMAtriptan, Vitamin D (Ergocalciferol), amitriptyline, aspirin-acetaminophen-caffeine, empagliflozin, famotidine, glucose blood, goserelin, metFORMIN, metoCLOPramide, oxyCODONE, rosuvastatin, and tamoxifen  History Review  Allergy: [name] is allergic to aspirin. Drug: [name]  reports no history of drug use. Alcohol:  reports no history of alcohol use. Tobacco:  reports that she has been smoking cigarettes. She has a 11.00 pack-year smoking history. She has quit using smokeless tobacco.  Her smokeless tobacco use included snuff. Social:  [name]  reports that she has been smoking cigarettes. She has a 11.00 pack-year smoking history. She has quit using smokeless tobacco.  Her smokeless tobacco use included snuff. She reports that she does not drink alcohol and does not use drugs. Medical:  has a past medical history of Allergy, Breast cancer ([place]), Cancer ([place]) (06/15/2017), Depression, Diabetes mellitus without complication ([place]) (9977), Edema of left upper extremity, Endometriosis, Family history of breast cancer, Headache, Hyperlipidemia, Lymphedema of left arm, Ovarian mass, Personal history of chemotherapy, Personal history of radiation therapy, and Pneumonia. Surgical: [name]  has a past surgical history that includes Oophorectomy; Tubal ligation; Portacath placement (Right, 07/14/2017); Axillary lymph node biopsy (Left, 07/14/2017); Breast lumpectomy (Left, 01/12/2018); Breast biopsy (Left); Partial mastectomy with needle localization (Left, 01/12/2018); Sentinel node biopsy (Left, 01/12/2018); and Colonoscopy with propofol (N/A, 12/27/2019). Family: family history includes Breast cancer in an other family member; Breast cancer (age of onset: 70) in her maternal grandmother; Breast cancer (age of onset: 27) in an other family member; Breast cancer (age of onset: 49) in her maternal aunt; Cancer in her mother; Colon cancer in her maternal grandmother and mother; Diabetes in her brother; Other in her father; Pancreatitis in her brother; Prostate cancer (age of onset: 47) in her brother.  Laboratory Chemistry Profile   Renal Lab Results  Component Value Date   BUN 10 02/22/2021   CREATININE 0.53 02/22/2021   BCR 13 02/12/2021   GFRAA >60 10/21/2019   GFRNONAA >60 02/22/2021    Hepatic Lab Results  Component Value Date   AST 15 02/22/2021   ALT 17 02/22/2021   ALBUMIN 4.2 02/22/2021   ALKPHOS 61 02/22/2021   LIPASE 31 12/01/2020    Electrolytes Lab Results  Component Value Date   NA 135 02/22/2021   K 3.3 (L) 02/22/2021    CL 99 02/22/2021   CALCIUM 9.3 02/22/2021   MG 2.1 02/12/2021    Bone Lab Results  Component Value Date   VD25OH 45.4 11/11/2020    Inflammation (CRP: Acute Phase) (ESR: Chronic Phase) Lab Results  Component Value Date   CRP <1 12/21/2020   ESRSEDRATE 3 12/21/2020         Note: Above Lab results reviewed.  Recent Imaging Review  MR Brain W Wo Contrast CLINICAL DATA:  Daily headaches, mainly behind ears for 2 months.  EXAM: MRI HEAD WITHOUT AND WITH CONTRAST  TECHNIQUE: Multiplanar, multiecho pulse sequences of the brain and surrounding structures were obtained without and with intravenous contrast.  CONTRAST:  7.94m GADAVIST GADOBUTROL 1 MMOL/ML IV SOLN  COMPARISON:  None.  FINDINGS: Brain: There is no acute infarction or intracranial hemorrhage. There is no intracranial mass, mass effect, or edema. There is no hydrocephalus. Ventricles and sulci are normal in size and configuration. Minor dural thickening and enhancement along the right frontoparietal convexity.  Vascular: Major vessel flow voids at the skull base are preserved.  Skull and upper cervical spine: Normal marrow signal is preserved.  Sinuses/Orbits: Paranasal sinuses are aerated. Orbits are unremarkable.  Other: Sella is unremarkable.  Mastoid air cells are clear.  IMPRESSION: Minor dural thickening and enhancement along the right frontoparietal convexity. This is nonspecific and of unclear etiology in isolation as remainder of study is negative. Possibly reflects sequelae of prior subdural hematoma. Consider follow-up given breast cancer history.  Electronically Signed   By: Macy Mis M.D.   On: 01/07/2021 10:13 Note: Reviewed        Physical Exam  General appearance: Well nourished, well developed, and well hydrated. In no apparent acute distress Mental status: Alert, oriented x 3 (person, place, & time)       Respiratory: No evidence of acute respiratory distress Eyes:  PERLA Vitals: BP 129/88    Pulse 90    Temp (!) 97 F (36.1 C)    Ht '5\' 4"'  (1.626 m)    Wt 164 lb (74.4 kg)    LMP  (LMP Unknown) Comment: LAST PERIOD IN MAY 2019 WHEN SHE STARTED CHEMO   SpO2 99%    BMI 28.15 kg/m  BMI: Estimated body mass index is 28.15 kg/m as calculated from the following:   Height as of this encounter: '5\' 4"'  (1.626 m).   Weight as of this encounter: 164 lb (74.4 kg). Ideal: Ideal body weight: 54.7 kg (120 lb 9.5 oz) Adjusted ideal body weight: 62.6 kg (137 lb 15.3 oz)  Assessment   Status Diagnosis  Controlled Controlled Controlled 1. Chronic pain syndrome   2. Chronic feet pain (1ry area of Pain) (Bilateral) (R>L)   3. Chronic knee pain (2ry area of Pain) (Bilateral) (R>L)   4. Cervicalgia   5. Chronic hand pain (3ry area of Pain) (Bilateral) (R>L)   6. Chronic hip pain (4th area of Pain) (Bilateral) (R>L)   7. Chronic low back pain (Bilateral (L>R) w/o sciatica   8. Lumbar facet syndrome (Bilateral) (L>R)   9. Arthropathy of spinal facet joint concurrent with and due to effusion (L3-4, L4-5)   10. 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 DDD (degenerative disc disease), lumbosacral   12. Abnormal MRI, lumbar spine (04/20/2018)   13. Cancer-related pain   14. Pharmacologic therapy   15. Chronic use of opiate for therapeutic purpose   16. Encounter for medication management   17. Encounter for chronic pain management      Updated Problems: Problem  Grade 1 Anterolisthesis of lumbar spine (L3/L4 and L4/L5)   (04/20/2018) LUMBAR MRI FINDINGS: Alignment:  Trace facet mediated anterolisthesis L3-4 and L4-5.   Arthropathy of spinal facet joint concurrent with and due to effusion (L3-4, L4-5)   (04/20/2018) LUMBAR MRI FINDINGS: LEVELS: L3-4: Facet arthropathy with joint effusions, greater on the LEFT. L4-5: Facet arthropathy. Small joint effusions.   Personal History of Breast Cancer  Abnormal MRI, lumbar spine (04/20/2018)    (04/20/2018) LUMBAR MRI FINDINGS: Alignment:  Trace facet mediated anterolisthesis L3-4 and L4-5.   DISC LEVELS: L3-4: Trace anterolisthesis. Annular bulge. Facet arthropathy with joint effusions, greater on the LEFT. L4-5: Trace anterolisthesis. Annular bulge. Facet arthropathy and ligamentum flavum hypertrophy. Small joint effusions. L5-S1: Mild facet arthropathy.    IMPRESSION: Degenerative trace anterolisthesis L3-4 with annular bulge. Facet arthropathy with joint effusions, greater on the LEFT.   Degenerative trace anterolisthesis L4-5 with annular bulge. Facet arthropathy with joint effusions  Chronic Use of Opiate for Therapeutic Purpose  Problems Influencing Health Status    Plan of Care  Problem-specific:  No problem-specific Assessment & Plan notes found for this encounter.  Ms. JAYSA KISE has a current medication list which includes the following long-term medication(s): amitriptyline, famotidine, metformin, metoclopramide, [START ON 03/06/2021] oxycodone, [START ON 04/05/2021] oxycodone, [START ON 05/05/2021] oxycodone, rosuvastatin, and sumatriptan.  Pharmacotherapy (Medications Ordered): Meds ordered this encounter  Medications   oxyCODONE (OXY IR/ROXICODONE) 5 MG immediate release tablet    Sig: Take 1 tablet (5 mg total) by mouth 2 (two) times daily as needed for severe pain. Must last 30 days    Dispense:  60 tablet    Refill:  0    DO NOT: delete (not duplicate); no partial-fill (will deny script to complete), no refill request (F/U required). DISPENSE: 1 day early if closed on fill date. WARN: No CNS-depressants within 8 hrs of med.   oxyCODONE (OXY IR/ROXICODONE) 5 MG immediate release tablet    Sig: Take 1 tablet (5 mg total) by mouth 2 (two) times daily as needed for severe pain. Must last 30 days    Dispense:  60 tablet    Refill:  0    DO NOT: delete (not duplicate); no partial-fill (will deny script to complete), no refill request (F/U required).  DISPENSE: 1 day early if closed on fill date. WARN: No CNS-depressants within 8 hrs of med.   oxyCODONE (OXY IR/ROXICODONE) 5 MG immediate release tablet    Sig: Take 1 tablet (5 mg total) by mouth 2 (two) times daily as needed for severe pain. Must last 30 days    Dispense:  60 tablet    Refill:  0    DO NOT: delete (not duplicate); no partial-fill (will deny script to complete), no refill request (F/U required). DISPENSE: 1 day early if closed on fill date. WARN: No CNS-depressants within 8 hrs of med.   Orders:  No orders of the defined types were placed in this encounter.  Follow-up plan:   Return in about 3 months (around 06/04/2021) for Eval-day (M,W), (F2F), (MM).     Interventional Therapies  Risk   Complexity Considerations:   Estimated body mass index is 30.55 kg/m as calculated from the following:   Height as of this encounter: '5\' 4"'  (1.626 m).   Weight as of this encounter: 178 lb (80.7 kg). WNL   Planned   Pending:      Under consideration:   Possible bilateral lumbar facet RFA  Diagnostic lumbar sympathetic Blk  Diagnostic right genicular NB  Possible right genicular nerve RFA    Completed:   Therapeutic right IA steroid knee inj. x1 (08/18/2020) (100/100/100 x 24 hours/80-85)  Diagnostic bilateral lumbar facet Blk x2 (08/16/18) (100/100/80/90-100)  Therapeutic/palliative right knee Hyalgan inj. x5 (06/25/2019) (100/100/75 x 1 week/70)    Therapeutic   Palliative (PRN) options:   Therapeutic right IA steroid knee inj.  Diagnostic bilateral lumbar facet MBB   Therapeutic/palliative right Hyalgan knee inj.      Recent Visits No visits were found meeting these conditions. Showing recent visits within past 90 days and meeting all other requirements Today's Visits Date Type Provider Dept  03/03/21 Office Visit Milinda Pointer, MD [facility]  Showing today's visits and meeting all other requirements Future Appointments No visits were found meeting  these conditions. Showing future appointments within next 90 days and meeting all other requirements  I discussed the assessment and treatment plan with  the patient. The patient was provided an opportunity to ask questions and all were answered. The patient agreed with the plan and demonstrated an understanding of the instructions.  Patient advised to call back or seek an in-person evaluation if the symptoms or condition worsens.  Duration of encounter: 30 minutes.  Note by: Gaspar Cola, MD Date: 03/03/2021; Time: 2:58 PM

## 2021-03-03 ENCOUNTER — Encounter: Payer: Self-pay | Admitting: Pain Medicine

## 2021-03-03 ENCOUNTER — Ambulatory Visit: Payer: No Typology Code available for payment source

## 2021-03-03 ENCOUNTER — Other Ambulatory Visit: Payer: Self-pay

## 2021-03-03 ENCOUNTER — Ambulatory Visit: Payer: No Typology Code available for payment source | Attending: Pain Medicine | Admitting: Pain Medicine

## 2021-03-03 VITALS — BP 129/88 | HR 90 | Temp 97.0°F | Ht 64.0 in | Wt 164.0 lb

## 2021-03-03 DIAGNOSIS — Z79899 Other long term (current) drug therapy: Secondary | ICD-10-CM | POA: Diagnosis present

## 2021-03-03 DIAGNOSIS — M79642 Pain in left hand: Secondary | ICD-10-CM | POA: Diagnosis present

## 2021-03-03 DIAGNOSIS — M542 Cervicalgia: Secondary | ICD-10-CM

## 2021-03-03 DIAGNOSIS — M79641 Pain in right hand: Secondary | ICD-10-CM | POA: Diagnosis present

## 2021-03-03 DIAGNOSIS — Z79891 Long term (current) use of opiate analgesic: Secondary | ICD-10-CM | POA: Diagnosis present

## 2021-03-03 DIAGNOSIS — M2540 Effusion, unspecified joint: Secondary | ICD-10-CM | POA: Diagnosis present

## 2021-03-03 DIAGNOSIS — G893 Neoplasm related pain (acute) (chronic): Secondary | ICD-10-CM

## 2021-03-03 DIAGNOSIS — G8929 Other chronic pain: Secondary | ICD-10-CM | POA: Diagnosis present

## 2021-03-03 DIAGNOSIS — M47816 Spondylosis without myelopathy or radiculopathy, lumbar region: Secondary | ICD-10-CM | POA: Diagnosis present

## 2021-03-03 DIAGNOSIS — M4316 Spondylolisthesis, lumbar region: Secondary | ICD-10-CM

## 2021-03-03 DIAGNOSIS — M25562 Pain in left knee: Secondary | ICD-10-CM | POA: Diagnosis present

## 2021-03-03 DIAGNOSIS — G4733 Obstructive sleep apnea (adult) (pediatric): Secondary | ICD-10-CM | POA: Insufficient documentation

## 2021-03-03 DIAGNOSIS — M25561 Pain in right knee: Secondary | ICD-10-CM | POA: Diagnosis not present

## 2021-03-03 DIAGNOSIS — M79671 Pain in right foot: Secondary | ICD-10-CM | POA: Diagnosis not present

## 2021-03-03 DIAGNOSIS — M5137 Other intervertebral disc degeneration, lumbosacral region: Secondary | ICD-10-CM

## 2021-03-03 DIAGNOSIS — M25551 Pain in right hip: Secondary | ICD-10-CM | POA: Diagnosis present

## 2021-03-03 DIAGNOSIS — R937 Abnormal findings on diagnostic imaging of other parts of musculoskeletal system: Secondary | ICD-10-CM | POA: Diagnosis present

## 2021-03-03 DIAGNOSIS — M51379 Other intervertebral disc degeneration, lumbosacral region without mention of lumbar back pain or lower extremity pain: Secondary | ICD-10-CM

## 2021-03-03 DIAGNOSIS — M25552 Pain in left hip: Secondary | ICD-10-CM | POA: Diagnosis present

## 2021-03-03 DIAGNOSIS — G894 Chronic pain syndrome: Secondary | ICD-10-CM | POA: Diagnosis not present

## 2021-03-03 DIAGNOSIS — M47819 Spondylosis without myelopathy or radiculopathy, site unspecified: Secondary | ICD-10-CM | POA: Diagnosis present

## 2021-03-03 DIAGNOSIS — M545 Low back pain, unspecified: Secondary | ICD-10-CM | POA: Insufficient documentation

## 2021-03-03 DIAGNOSIS — G43009 Migraine without aura, not intractable, without status migrainosus: Secondary | ICD-10-CM | POA: Insufficient documentation

## 2021-03-03 DIAGNOSIS — M79672 Pain in left foot: Secondary | ICD-10-CM | POA: Diagnosis present

## 2021-03-03 DIAGNOSIS — G4719 Other hypersomnia: Secondary | ICD-10-CM | POA: Insufficient documentation

## 2021-03-03 MED ORDER — OXYCODONE HCL 5 MG PO TABS: 5.0000 mg | ORAL_TABLET | Freq: Two times a day (BID) | ORAL | 0 refills | Status: DC | PRN

## 2021-03-03 NOTE — Progress Notes (Signed)
Nursing Pain Medication Assessment:  Safety precautions to be maintained throughout the outpatient stay will include: orient to surroundings, keep bed in low position, maintain call bell within reach at all times, provide assistance with transfer out of bed and ambulation.  Medication Inspection Compliance: Pill count conducted under aseptic conditions, in front of the patient. Neither the pills nor the bottle was removed from the patient's sight at any time. Once count was completed pills were immediately returned to the patient in their original bottle.  Medication: Oxycodone IR Pill/Patch Count:  18 of 60 pills remain Pill/Patch Appearance: Markings consistent with prescribed medication Bottle Appearance: Standard pharmacy container. Clearly labeled. Filled Date: 1 / 3 / 2023 Last Medication intake:  TodaySafety precautions to be maintained throughout the outpatient stay will include: orient to surroundings, keep bed in low position, maintain call bell within reach at all times, provide assistance with transfer out of bed and ambulation.

## 2021-03-03 NOTE — Patient Instructions (Signed)

## 2021-03-05 ENCOUNTER — Other Ambulatory Visit: Payer: Self-pay

## 2021-03-13 ENCOUNTER — Other Ambulatory Visit: Payer: Self-pay | Admitting: Internal Medicine

## 2021-03-14 ENCOUNTER — Other Ambulatory Visit: Payer: Self-pay | Admitting: Internal Medicine

## 2021-03-15 ENCOUNTER — Encounter: Payer: Self-pay | Admitting: Internal Medicine

## 2021-03-17 ENCOUNTER — Encounter: Payer: Self-pay | Admitting: Internal Medicine

## 2021-03-26 ENCOUNTER — Inpatient Hospital Stay: Payer: No Typology Code available for payment source | Attending: Internal Medicine | Admitting: Internal Medicine

## 2021-03-26 ENCOUNTER — Other Ambulatory Visit: Payer: Self-pay

## 2021-03-26 ENCOUNTER — Encounter: Payer: Self-pay | Admitting: Internal Medicine

## 2021-03-26 VITALS — BP 103/69 | HR 88 | Temp 97.6°F | Wt 164.4 lb

## 2021-03-26 DIAGNOSIS — F32A Depression, unspecified: Secondary | ICD-10-CM | POA: Insufficient documentation

## 2021-03-26 DIAGNOSIS — Z8042 Family history of malignant neoplasm of prostate: Secondary | ICD-10-CM | POA: Diagnosis not present

## 2021-03-26 DIAGNOSIS — Z79899 Other long term (current) drug therapy: Secondary | ICD-10-CM | POA: Diagnosis not present

## 2021-03-26 DIAGNOSIS — G43009 Migraine without aura, not intractable, without status migrainosus: Secondary | ICD-10-CM | POA: Insufficient documentation

## 2021-03-26 DIAGNOSIS — Z886 Allergy status to analgesic agent status: Secondary | ICD-10-CM | POA: Insufficient documentation

## 2021-03-26 DIAGNOSIS — R0683 Snoring: Secondary | ICD-10-CM | POA: Insufficient documentation

## 2021-03-26 DIAGNOSIS — Z170 Estrogen receptor positive status [ER+]: Secondary | ICD-10-CM | POA: Insufficient documentation

## 2021-03-26 DIAGNOSIS — Z8379 Family history of other diseases of the digestive system: Secondary | ICD-10-CM | POA: Insufficient documentation

## 2021-03-26 DIAGNOSIS — M25569 Pain in unspecified knee: Secondary | ICD-10-CM | POA: Insufficient documentation

## 2021-03-26 DIAGNOSIS — Z62810 Personal history of physical and sexual abuse in childhood: Secondary | ICD-10-CM | POA: Insufficient documentation

## 2021-03-26 DIAGNOSIS — E785 Hyperlipidemia, unspecified: Secondary | ICD-10-CM | POA: Insufficient documentation

## 2021-03-26 DIAGNOSIS — Z833 Family history of diabetes mellitus: Secondary | ICD-10-CM | POA: Diagnosis not present

## 2021-03-26 DIAGNOSIS — Z800 Family history of malignant neoplasm of digestive organs: Secondary | ICD-10-CM | POA: Insufficient documentation

## 2021-03-26 DIAGNOSIS — Z90721 Acquired absence of ovaries, unilateral: Secondary | ICD-10-CM | POA: Insufficient documentation

## 2021-03-26 DIAGNOSIS — C50512 Malignant neoplasm of lower-outer quadrant of left female breast: Secondary | ICD-10-CM | POA: Insufficient documentation

## 2021-03-26 DIAGNOSIS — F419 Anxiety disorder, unspecified: Secondary | ICD-10-CM | POA: Diagnosis not present

## 2021-03-26 DIAGNOSIS — E1140 Type 2 diabetes mellitus with diabetic neuropathy, unspecified: Secondary | ICD-10-CM | POA: Diagnosis not present

## 2021-03-26 DIAGNOSIS — M549 Dorsalgia, unspecified: Secondary | ICD-10-CM | POA: Insufficient documentation

## 2021-03-26 DIAGNOSIS — Z794 Long term (current) use of insulin: Secondary | ICD-10-CM | POA: Insufficient documentation

## 2021-03-26 DIAGNOSIS — Z803 Family history of malignant neoplasm of breast: Secondary | ICD-10-CM | POA: Insufficient documentation

## 2021-03-26 DIAGNOSIS — F17210 Nicotine dependence, cigarettes, uncomplicated: Secondary | ICD-10-CM | POA: Diagnosis not present

## 2021-03-26 DIAGNOSIS — G4489 Other headache syndrome: Secondary | ICD-10-CM | POA: Diagnosis not present

## 2021-03-26 DIAGNOSIS — Z923 Personal history of irradiation: Secondary | ICD-10-CM | POA: Diagnosis not present
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
## 2021-04-12 ENCOUNTER — Encounter: Payer: Self-pay | Admitting: Internal Medicine

## 2021-04-13 ENCOUNTER — Other Ambulatory Visit: Payer: Self-pay | Admitting: Nurse Practitioner

## 2021-04-13 NOTE — Telephone Encounter (Signed)
Requested Prescriptions  ?Pending Prescriptions Disp Refills  ?? famotidine (PEPCID) 20 MG tablet [Pharmacy Med Name: FAMOTIDINE 20 MG TABLET] 90 tablet 2  ?  Sig: TAKE 1 TABLET BY MOUTH EVERY DAY  ?  ? Gastroenterology:  H2 Antagonists Passed - 04/13/2021  1:45 AM  ?  ?  Passed - Valid encounter within last 12 months  ?  Recent Outpatient Visits   ?      ? 2 months ago Type 2 diabetes mellitus with hyperglycemia, with long-term current use of insulin ([place])  ? [facility] [place], Evart T, NP  ? 3 months ago Gastroesophageal reflux disease without esophagitis  ? [place], [place] T, NP  ? 3 months ago New daily persistent headache  ? [place], NP  ? 5 months ago Type 2 diabetes mellitus with hyperglycemia, with long-term current use of insulin ([place])  ? [place], [place]anger T, NP  ? 1 year ago Type 2 diabetes mellitus with hyperglycemia, with long-term current use of insulin ([place])  ? [facility] [place], Henrine Screws T, NP  ?  ?  ?Future Appointments   ?        ? In 1 month [place], Barbaraann Faster, NP [employer]  ?  ? ?  ?  ?  ? ?

## 2021-04-30 ENCOUNTER — Other Ambulatory Visit: Payer: Self-pay | Admitting: Emergency Medicine

## 2021-04-30 DIAGNOSIS — G4719 Other hypersomnia: Secondary | ICD-10-CM

## 2021-05-04 ENCOUNTER — Telehealth: Payer: Self-pay | Admitting: Nurse Practitioner

## 2021-05-04 ENCOUNTER — Other Ambulatory Visit: Payer: Self-pay | Admitting: Nurse Practitioner

## 2021-05-05 NOTE — Telephone Encounter (Signed)
Last ov 02/12/21 ? ?Up coming visit 05/14/21 ?

## 2021-05-08 NOTE — Patient Instructions (Addendum)
Start back on Basaglar and stop Trulicity. Start at 10 units and increased by 3 units every 3 days if fasting morning blood sugar is >130.  If you get to 30 units contact me.  ? ?Diabetes Mellitus and Nutrition, Adult ?When you have diabetes, or diabetes mellitus, it is very important to have healthy eating habits because your blood sugar (glucose) levels are greatly affected by what you eat and drink. Eating healthy foods in the right amounts, at about the same times every day, can help you: ?Manage your blood glucose. ?Lower your risk of heart disease. ?Improve your blood pressure. ?Reach or maintain a healthy weight. ?What can affect my meal plan? ?Every person with diabetes is different, and each person has different needs for a meal plan. Your health care provider may recommend that you work with a dietitian to make a meal plan that is best for you. Your meal plan may vary depending on factors such as: ?The calories you need. ?The medicines you take. ?Your weight. ?Your blood glucose, blood pressure, and cholesterol levels. ?Your activity level. ?Other health conditions you have, such as heart or kidney disease. ?How do carbohydrates affect me? ?Carbohydrates, also called carbs, affect your blood glucose level more than any other type of food. Eating carbs raises the amount of glucose in your blood. ?It is important to know how many carbs you can safely have in each meal. This is different for every person. Your dietitian can help you calculate how many carbs you should have at each meal and for each snack. ?How does alcohol affect me? ?Alcohol can cause a decrease in blood glucose (hypoglycemia), especially if you use insulin or take certain diabetes medicines by mouth. Hypoglycemia can be a life-threatening condition. Symptoms of hypoglycemia, such as sleepiness, dizziness, and confusion, are similar to symptoms of having too much alcohol. ?Do not drink alcohol if: ?Your health care provider tells you not to  drink. ?You are pregnant, may be pregnant, or are planning to become pregnant. ?If you drink alcohol: ?Limit how much you have to: ?0-1 drink a day for women. ?0-2 drinks a day for men. ?Know how much alcohol is in your drink. In the [place] one drink equals one 12 oz bottle of beer (355 mL), one 5 oz glass of wine (148 mL), or one 1? oz glass of hard liquor (44 mL). ?Keep yourself hydrated with water, diet soda, or unsweetened iced tea. Keep in mind that regular soda, juice, and other mixers may contain a lot of sugar and must be counted as carbs. ?What are tips for following this plan? ?Reading food labels ?Start by checking the serving size on the Nutrition Facts label of packaged foods and drinks. The number of calories and the amount of carbs, fats, and other nutrients listed on the label are based on one serving of the item. Many items contain more than one serving per package. ?Check the total grams (g) of carbs in one serving. ?Check the number of grams of saturated fats and trans fats in one serving. Choose foods that have a low amount or none of these fats. ?Check the number of milligrams (mg) of salt (sodium) in one serving. Most people should limit total sodium intake to less than 2,300 mg per day. ?Always check the nutrition information of foods labeled as "low-fat" or "nonfat." These foods may be higher in added sugar or refined carbs and should be avoided. ?Talk to your dietitian to identify your daily goals for nutrients listed  on the label. ?Shopping ?Avoid buying canned, pre-made, or processed foods. These foods tend to be high in fat, sodium, and added sugar. ?Shop around the outside edge of the grocery store. This is where you will most often find fresh fruits and vegetables, bulk grains, fresh meats, and fresh dairy products. ?Cooking ?Use low-heat cooking methods, such as baking, instead of high-heat cooking methods, such as deep frying. ?Cook using healthy oils, such as olive, canola, or  sunflower oil. ?Avoid cooking with butter, cream, or high-fat meats. ?Meal planning ?Eat meals and snacks regularly, preferably at the same times every day. Avoid going long periods of time without eating. ?Eat foods that are high in fiber, such as fresh fruits, vegetables, beans, and whole grains. ?Eat 4-6 oz (112-168 g) of lean protein each day, such as lean meat, chicken, fish, eggs, or tofu. One ounce (oz) (28 g) of lean protein is equal to: ?1 oz (28 g) of meat, chicken, or fish. ?1 egg. ?? cup (62 g) of tofu. ?Eat some foods each day that contain healthy fats, such as avocado, nuts, seeds, and fish. ?What foods should I eat? ?Fruits ?Berries. Apples. Oranges. Peaches. Apricots. Plums. Grapes. Mangoes. Papayas. Pomegranates. Kiwi. Cherries. ?Vegetables ?Leafy greens, including lettuce, spinach, kale, chard, collard greens, mustard greens, and cabbage. Beets. Cauliflower. Broccoli. Carrots. Green beans. Tomatoes. Peppers. Onions. Cucumbers. Brussels sprouts. ?Grains ?Whole grains, such as whole-wheat or whole-grain bread, crackers, tortillas, cereal, and pasta. Unsweetened oatmeal. Quinoa. Brown or wild rice. ?Meats and other proteins ?Seafood. Poultry without skin. Lean cuts of poultry and beef. Tofu. Nuts. Seeds. ?Dairy ?Low-fat or fat-free dairy products such as milk, yogurt, and cheese. ?The items listed above may not be a complete list of foods and beverages you can eat and drink. Contact a dietitian for more information. ?What foods should I avoid? ?Fruits ?Fruits canned with syrup. ?Vegetables ?Canned vegetables. Frozen vegetables with butter or cream sauce. ?Grains ?Refined white flour and flour products such as bread, pasta, snack foods, and cereals. Avoid all processed foods. ?Meats and other proteins ?Fatty cuts of meat. Poultry with skin. Breaded or fried meats. Processed meat. Avoid saturated fats. ?Dairy ?Full-fat yogurt, cheese, or milk. ?Beverages ?Sweetened drinks, such as soda or iced tea. ?The  items listed above may not be a complete list of foods and beverages you should avoid. Contact a dietitian for more information. ?Questions to ask a health care provider ?Do I need to meet with a certified diabetes care and education specialist? ?Do I need to meet with a dietitian? ?What number can I call if I have questions? ?When are the best times to check my blood glucose? ?Where to find more information: ?American Diabetes Association: diabetes.org ?Academy of Nutrition and Dietetics: eatright.org ?[name] of Diabetes and Digestive and Kidney Diseases: AmenCredit.is ?Association of Diabetes Care & Education Specialists: diabeteseducator.org ?Summary ?It is important to have healthy eating habits because your blood sugar (glucose) levels are greatly affected by what you eat and drink. It is important to use alcohol carefully. ?A healthy meal plan will help you manage your blood glucose and lower your risk of heart disease. ?Your health care provider may recommend that you work with a dietitian to make a meal plan that is best for you. ?This information is not intended to replace advice given to you by your health care provider. Make sure you discuss any questions you have with your health care provider. ?Document Revised: 08/28/2019 Document Reviewed: 08/28/2019 ?Elsevier Patient Education ? New Site. ? ?

## 2021-05-11 ENCOUNTER — Telehealth: Payer: Self-pay

## 2021-05-11 NOTE — Telephone Encounter (Signed)
Left pt voicemail regarding she is due for a MRI by 05/19/2021 and that it needs to be scheduled no later than this date. Explained she can call [facility] centralized scheduling at [phone] to get the scan scheduled. If she has any questions or concerns, she was told to call the office.  ?

## 2021-05-14 ENCOUNTER — Ambulatory Visit (INDEPENDENT_AMBULATORY_CARE_PROVIDER_SITE_OTHER): Payer: No Typology Code available for payment source | Admitting: Nurse Practitioner

## 2021-05-14 ENCOUNTER — Other Ambulatory Visit: Payer: Self-pay | Admitting: Nurse Practitioner

## 2021-05-14 ENCOUNTER — Encounter: Payer: Self-pay | Admitting: Nurse Practitioner

## 2021-05-14 VITALS — BP 96/68 | HR 81 | Temp 98.4°F | Ht 64.0 in | Wt 169.0 lb

## 2021-05-14 DIAGNOSIS — G894 Chronic pain syndrome: Secondary | ICD-10-CM

## 2021-05-14 DIAGNOSIS — F19982 Other psychoactive substance use, unspecified with psychoactive substance-induced sleep disorder: Secondary | ICD-10-CM

## 2021-05-14 DIAGNOSIS — T451X5A Adverse effect of antineoplastic and immunosuppressive drugs, initial encounter: Secondary | ICD-10-CM

## 2021-05-14 DIAGNOSIS — G620 Drug-induced polyneuropathy: Secondary | ICD-10-CM

## 2021-05-14 DIAGNOSIS — F322 Major depressive disorder, single episode, severe without psychotic features: Secondary | ICD-10-CM

## 2021-05-14 DIAGNOSIS — C50512 Malignant neoplasm of lower-outer quadrant of left female breast: Secondary | ICD-10-CM | POA: Diagnosis not present

## 2021-05-14 DIAGNOSIS — E1169 Type 2 diabetes mellitus with other specified complication: Secondary | ICD-10-CM

## 2021-05-14 DIAGNOSIS — Z794 Long term (current) use of insulin: Secondary | ICD-10-CM

## 2021-05-14 DIAGNOSIS — F1120 Opioid dependence, uncomplicated: Secondary | ICD-10-CM

## 2021-05-14 DIAGNOSIS — E785 Hyperlipidemia, unspecified: Secondary | ICD-10-CM

## 2021-05-14 DIAGNOSIS — Z170 Estrogen receptor positive status [ER+]: Secondary | ICD-10-CM

## 2021-05-14 DIAGNOSIS — F17219 Nicotine dependence, cigarettes, with unspecified nicotine-induced disorders: Secondary | ICD-10-CM

## 2021-05-14 DIAGNOSIS — E1165 Type 2 diabetes mellitus with hyperglycemia: Secondary | ICD-10-CM

## 2021-05-14 LAB — BAYER DCA HB A1C WAIVED: HB A1C (BAYER DCA - WAIVED): 10 % — ABNORMAL HIGH (ref 4.8–5.6)

## 2021-05-14 MED ORDER — BASAGLAR KWIKPEN 100 UNIT/ML ~~LOC~~ SOPN: 10.0000 [IU] | PEN_INJECTOR | Freq: Every day | SUBCUTANEOUS | 4 refills | Status: DC

## 2021-05-14 MED ORDER — INSULIN PEN NEEDLE 30G X 8 MM MISC: 1.0000 | 4 refills | Status: DC | PRN

## 2021-05-14 NOTE — Progress Notes (Signed)
BP 96/68   Pulse 81   Temp 98.4 F (36.9 C) (Oral)   Ht '5\' 4"'$  (1.626 m)   Wt 169 lb (76.7 kg)   LMP  (LMP Unknown) Comment: LAST PERIOD IN MAY 2019 WHEN SHE STARTED CHEMO  SpO2 98%   BMI 29.01 kg/m    Subjective:    Patient ID: Carolyn Roy, female    DOB: 29-Jan-1966, 56 y.o.   MRN: [id number]  HPI: Carolyn Roy is a 56 y.o. female  Chief Complaint  Patient presents with   Diabetes   Hyperlipidemia   Hypertension   Gastroesophageal Reflux   Mood   Nausea    Patient states she is still having the nausea and vomiting and it is not as consistent as it was, but it is starting to interfere with her work.   DIABETES Last A1c 10.1% January -- added back on Metformin XR. Continues on Jardiance 25 MG and Trulicity 5.88 MG weekly -- tried 1.5 MG but unsure if this caused GI symptoms.  Very concerned with nausea and vomiting, this is the worse she has ever experienced. States it is interfering with work. She believe it might be due to the Trulicity. Patient wishes to discontinue the Trulicity. Even with reduction in medication this continues.  Followed by pain clinic for chronic pain -- last visit 03/03/21 continues Oxycodone.    She is a smoker, 1/2 PPD.  Quit for 6 years, but then started back.  She started smoking at age 5, smoked 34-35 years total. Hypoglycemic episodes:no Polydipsia/polyuria: no Visual disturbance: no Chest pain: no Paresthesias: no Glucose Monitoring: yes  Accucheck frequency: occasional  Fasting glucose: 80-150 am  Post prandial: 250-300  Evening:   Before meals:  Taking Insulin?: yes  Long acting insulin:   Short acting insulin: Blood Pressure Monitoring: not checking Retinal Examination: Not Up To Date Foot Exam: Up to Date Pneumovax: Up To Date Influenza: Up To Date Aspirin: no   HYPERLIPIDEMIA Taking Rosuvastatin 40 MG daily, we switched last visit due to not being at goal with Atorvastatin.  Had sleep study at [facility], she  reports Dr. Mickeal Skinner told her they want to do a CPAP titration -- the nurse is trying to get her an appointment. Satisfied with current treatment? yes Duration of hyperlipidemia: chronic Cholesterol medication side effects: no Cholesterol supplements: none Aspirin: no Recent stressors: no Recurrent headaches: no Visual changes: no Palpitations: no Dyspnea: no Chest pain: no Lower extremity edema: no Dizzy/lightheaded: no  The 10-year ASCVD risk score (Arnett DK, et al., 2019) is: 5.6%   Values used to calculate the score:     Age: 4 years     Sex: Female     Is Non-Hispanic African American: No     Diabetic: Yes     Tobacco smoker: Yes     Systolic Blood Pressure: 96 mmHg     Is BP treated: No     HDL Cholesterol: 52 mg/dL     Total Cholesterol: 223 mg/dL   DEPRESSION Is to be on Duloxetine 60 MG daily + Amitriptyline.  Followed by Dr. Nicolasa Ducking last visit 04/27/21.   Is currently followed by oncology for breast CA, left side.  Last saw oncology on 03/26/21 -- she is completed treatments, continues Tamoxifen and goes every 6 months for injections and infusions.  Mood status: stable (states a lot has been going on, feels "she can be more expressive/ emotional and everything stays bottled up" when she is not taking  the depression medication. States she occasionally takes duloxetine. Satisfied with current treatment?: yes Symptom severity: stable to mild  Duration of current treatment : chronic Side effects: no Medication compliance: good compliance Psychotherapy/counseling: Still looking for therapist Depressed mood: no Anxious mood: no Anhedonia: no Significant weight loss or gain: no Insomnia: yes hard to stay asleep Fatigue: no Feelings of worthlessness or guilt: no Impaired concentration/indecisiveness: no Suicidal ideations: no Hopelessness: no Crying spells: Yes, has become very frequent    05/14/2021   11:43 AM 02/12/2021    3:02 PM 11/18/2020    1:47 PM 11/11/2020     3:09 PM 08/18/2020    9:14 AM  Depression screen PHQ 2/9  Decreased Interest 1 0 0 0 0  Down, Depressed, Hopeless 1 0 0 0 0  PHQ - 2 Score 2 0 0 0 0  Altered sleeping '2 2  1   '$ Tired, decreased energy 1 1  0   Change in appetite '2 1  1   '$ Feeling bad or failure about yourself  0 0  0   Trouble concentrating 1 0  0   Moving slowly or fidgety/restless 0 0  0   Suicidal thoughts 0 0  0   PHQ-9 Score '8 4  2   '$ Difficult doing work/chores    Not difficult at all       05/14/2021   11:43 AM 02/12/2021    3:03 PM  GAD 7 : Generalized Anxiety Score  Nervous, Anxious, on Edge 1 0  Control/stop worrying 0 0  Worry too much - different things 0 0  Trouble relaxing 1 0  Restless 1 0  Easily annoyed or irritable 1 0  Afraid - awful might happen 0 0  Total GAD 7 Score 4 0  Anxiety Difficulty Somewhat difficult     Relevant past medical, surgical, family and social history reviewed and updated as indicated. Interim medical history since our last visit reviewed. Allergies and medications reviewed and updated.  Review of Systems  Constitutional:  Negative for activity change, appetite change, fatigue and fever.  Respiratory:  Negative for cough, chest tightness and shortness of breath.   Cardiovascular:  Negative for chest pain, palpitations and leg swelling.  Gastrointestinal:  Positive for constipation, nausea and vomiting.  Endocrine: Positive for polyuria. Negative for cold intolerance, heat intolerance, polydipsia and polyphagia.  Genitourinary:  Negative for difficulty urinating.  Musculoskeletal:  Negative for myalgias.  Neurological: Negative.  Negative for dizziness and light-headedness.  Psychiatric/Behavioral:  Positive for sleep disturbance. Negative for decreased concentration, self-injury and suicidal ideas. The patient is not nervous/anxious.    Per HPI unless specifically indicated above     Objective:    BP 96/68   Pulse 81   Temp 98.4 F (36.9 C) (Oral)   Ht '5\' 4"'$   (1.626 m)   Wt 169 lb (76.7 kg)   LMP  (LMP Unknown) Comment: LAST PERIOD IN MAY 2019 WHEN SHE STARTED CHEMO  SpO2 98%   BMI 29.01 kg/m   Wt Readings from Last 3 Encounters:  05/14/21 169 lb (76.7 kg)  03/26/21 164 lb 6.4 oz (74.6 kg)  03/03/21 164 lb (74.4 kg)    Physical Exam Vitals and nursing note reviewed.  Constitutional:      General: She is awake.     Appearance: She is well-developed and well-groomed.  HENT:     Head: Normocephalic.     Right Ear: Hearing normal.     Left Ear: Hearing normal.  Eyes:     General: Lids are normal.        Right eye: No discharge.        Left eye: No discharge.     Conjunctiva/sclera: Conjunctivae normal.     Pupils: Pupils are equal, round, and reactive to light.  Neck:     Thyroid: No thyromegaly.     Vascular: No carotid bruit.  Cardiovascular:     Rate and Rhythm: Normal rate and regular rhythm.     Pulses:          Dorsalis pedis pulses are 2+ on the right side and 2+ on the left side.     Heart sounds: Normal heart sounds. No murmur heard.   No gallop.  Pulmonary:     Effort: Pulmonary effort is normal. No accessory muscle usage or respiratory distress.     Breath sounds: Normal breath sounds. No wheezing or rhonchi.  Abdominal:     General: Bowel sounds are normal.     Palpations: Abdomen is soft.  Musculoskeletal:     Cervical back: Normal range of motion and neck supple.     Right lower leg: No edema.     Left lower leg: No edema.  Lymphadenopathy:     Cervical: No cervical adenopathy.  Skin:    General: Skin is warm and dry.  Neurological:     Mental Status: She is alert and oriented to person, place, and time.  Psychiatric:        Attention and Perception: Attention normal.        Mood and Affect: Mood normal.        Speech: Speech normal.        Behavior: Behavior normal. Behavior is cooperative.        Thought Content: Thought content normal.   Results for orders placed or performed in visit on 05/14/21   Bayer DCA Hb A1c Waived  Result Value Ref Range   HB A1C (BAYER DCA - WAIVED) 10.0 (H) 4.8 - 5.6 %      Assessment & Plan:   Problem List Items Addressed This Visit       Endocrine   Hyperlipidemia associated with type 2 diabetes mellitus (HCC)    Chronic, ongoing. Was switch from Atorvastatin 20 MG to Rosuvastatin 40 MG daily.  Lipid panel today and adjust dose as needed.      Relevant Medications   TRULICITY 1.5 WU/9.8JX SOPN   Insulin Glargine (BASAGLAR KWIKPEN) 100 UNIT/ML   Other Relevant Orders   Bayer DCA Hb A1c Waived (Completed)   Comprehensive metabolic panel   Lipid Panel w/o Chol/HDL Ratio   Type 2 diabetes mellitus with hyperglycemia, with long-term current use of insulin (HCC) - Primary    Chronic, ongoing.  A1c today is 10%, last was 10.1%. - Discontinue trulicity as patient is not tolerating it, even at lower dose.  - Will start Basaglar detailed instructions explained, written down and given to patient.  Previously followed by endo, but prefers not to return at this time.  - Will continue Jardiance at current dose and Metformin XR 1000 MG BID.   - New glucometer and supplies sent in last visit, with education on how to check.  Recommend monitor BS at home TID and heavily focus on diet changes.    - Return in 4 weeks.      Relevant Medications   TRULICITY 1.5 BJ/4.7WG SOPN   Insulin Glargine (BASAGLAR KWIKPEN) 100 UNIT/ML   Other Relevant Orders  Bayer DCA Hb A1c Waived (Completed)   Comprehensive metabolic panel     Nervous and Auditory   Chemotherapy-induced neuropathy (HCC) (Chronic)    Chronic, ongoing.  Continue current pain management collaboration and medication regimen as prescribed by them.      Nicotine dependence, cigarettes, w unsp disorders    I have recommended complete cessation of tobacco use. I have discussed various options available for assistance with tobacco cessation including over the counter methods (Nicotine gum, patch and  lozenges). We also discussed prescription options (Chantix, Nicotine Inhaler / Nasal Spray). The patient is not interested in pursuing any prescription tobacco cessation options at this time.  Recommend we start lung CT screening, she will plan on this next visit as has had a lot of testing recently.         Other   Chronic pain syndrome (Chronic)    Followed by pain management, continue this collaboration, recent notes reviewed.      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 Had the sleep study and is still waiting to hear back about c-pap titration, she has made several attempts but no one answer. She will try to contact the nurse about scheduling an appointment to talk to someone about C-pap.       Malignant neoplasm of lower-outer quadrant of left breast of female, estrogen receptor positive ([place])    Continue collaboration with oncology, recent note reviewed.      Severe depression (HCC)    Chronic, ongoing.Followed by Dr. Nicolasa Ducking last visit 04/27/21. Is to be on Duloxetine 60 MG daily + Amitriptyline. States she is trying to wean herself off all the psych meds and she occasionally takes duloxetine. Also states crying spells has become very frequent  Patient is instructed to inform Dr. Nicolasa Ducking about these changes. Continue current medication regimen and collaboration with psychiatry.  Currently denies SI/HI.        Follow up plan: Return in about 4 weeks (around 06/11/2021) for F6EP -- stopped Trulicity and started [employer].

## 2021-05-14 NOTE — Assessment & Plan Note (Signed)
Patient states her sleep has improved some. Had the sleep study and is still waiting to hear back about c-pap titration, she has made several attempts but no one answer. She will try to contact the nurse about scheduling an appointment to talk to someone about C-pap.  ?

## 2021-05-14 NOTE — Assessment & Plan Note (Signed)
Followed by pain management, continue this collaboration, recent notes reviewed. 

## 2021-05-14 NOTE — Assessment & Plan Note (Signed)
Chronic, ongoing.  Continue current pain management collaboration and medication regimen as prescribed by them. 

## 2021-05-14 NOTE — Assessment & Plan Note (Signed)
Continue collaboration with oncology, recent note reviewed. 

## 2021-05-14 NOTE — Assessment & Plan Note (Signed)
I have recommended complete cessation of tobacco use. I have discussed various options available for assistance with tobacco cessation including over the counter methods (Nicotine gum, patch and lozenges). We also discussed prescription options (Chantix, Nicotine Inhaler / Nasal Spray). The patient is not interested in pursuing any prescription tobacco cessation options at this time.  Recommend we start lung CT screening, she will plan on this next visit as has had a lot of testing recently.   

## 2021-05-14 NOTE — Assessment & Plan Note (Signed)
Chronic, ongoing.Followed by Dr. Nicolasa Ducking last visit 04/27/21. Is to be on Duloxetine 60 MG daily + Amitriptyline. States she is trying to wean herself off all the psych meds and she occasionally takes duloxetine. Also states crying spells has become very frequent  Patient is instructed to inform Dr. Nicolasa Ducking about these changes. Continue current medication regimen and collaboration with psychiatry.  Currently denies SI/HI. ?

## 2021-05-14 NOTE — Assessment & Plan Note (Addendum)
Chronic, ongoing. Was switch from Atorvastatin 20 MG to Rosuvastatin 40 MG daily.  Lipid panel today and adjust dose as needed. ?

## 2021-05-14 NOTE — Assessment & Plan Note (Addendum)
Chronic, ongoing.  A1c today is 10%, last was 10.1%. ?- Discontinue trulicity as patient is not tolerating it, even at lower dose.  ?- Will start Basaglar detailed instructions explained, written down and given to patient.  Previously followed by endo, but prefers not to return at this time.  ?- Will continue Jardiance at current dose and Metformin XR 1000 MG BID.   ?- New glucometer and supplies sent in last visit, with education on how to check.  Recommend monitor BS at home TID and heavily focus on diet changes.    ?- Return in 4 weeks. ?

## 2021-05-15 LAB — COMPREHENSIVE METABOLIC PANEL
ALT: 16 IU/L (ref 0–32)
AST: 13 IU/L (ref 0–40)
Albumin/Globulin Ratio: 2 (ref 1.2–2.2)
Albumin: 4.1 g/dL (ref 3.8–4.9)
Alkaline Phosphatase: 97 IU/L (ref 44–121)
BUN/Creatinine Ratio: 17 (ref 9–23)
BUN: 10 mg/dL (ref 6–24)
Bilirubin Total: 0.4 mg/dL (ref 0.0–1.2)
CO2: 27 mmol/L (ref 20–29)
Calcium: 9.1 mg/dL (ref 8.7–10.2)
Chloride: 98 mmol/L (ref 96–106)
Creatinine, Ser: 0.6 mg/dL (ref 0.57–1.00)
Globulin, Total: 2.1 g/dL (ref 1.5–4.5)
Glucose: 333 mg/dL — ABNORMAL HIGH (ref 70–99)
Potassium: 3.9 mmol/L (ref 3.5–5.2)
Sodium: 136 mmol/L (ref 134–144)
Total Protein: 6.2 g/dL (ref 6.0–8.5)
eGFR: 107 mL/min/{1.73_m2} (ref >59)

## 2021-05-15 LAB — LIPID PANEL W/O CHOL/HDL RATIO
Cholesterol, Total: 184 mg/dL (ref 100–199)
HDL: 63 mg/dL (ref >39)
LDL Chol Calc (NIH): 101 mg/dL — ABNORMAL HIGH (ref 0–99)
Triglycerides: 112 mg/dL (ref 0–149)
VLDL Cholesterol Cal: 20 mg/dL (ref 5–40)

## 2021-05-17 ENCOUNTER — Other Ambulatory Visit: Payer: Self-pay | Admitting: Radiation Therapy

## 2021-05-17 NOTE — Telephone Encounter (Signed)
Requested medication (s) are due for refill today: No ? ?Requested medication (s) are on the active medication list: No ? ?Last refill:   ? ?Future visit scheduled: No ? ?Notes to clinic:  Pharmacy requests alternative medication. ? ? ? ?Requested Prescriptions  ?Pending Prescriptions Disp Refills  ? TRESIBA FLEXTOUCH 100 UNIT/ML FlexTouch Pen [Pharmacy Med Name: TRESIBA FLEXTOUCH 100 UNIT/ML]  0  ?  ? Endocrinology:  Diabetes - Insulins Failed - 05/14/2021  1:22 PM  ?  ?  Failed - HBA1C is between 0 and 7.9 and within 180 days  ?  HB A1C (BAYER DCA - WAIVED)  ?Date Value Ref Range Status  ?05/14/2021 10.0 (H) 4.8 - 5.6 % Final  ?  Comment:  ?           Prediabetes: 5.7 - 6.4 ?         Diabetes: >6.4 ?         Glycemic control for adults with diabetes: <7.0 ?  ?  ?  ?  ?  Passed - Valid encounter within last 6 months  ?  Recent Outpatient Visits   ? ?      ? 3 days ago Type 2 diabetes mellitus with hyperglycemia, with long-term current use of insulin ([place])  ? [place], Beechwood T, NP  ? 3 months ago Type 2 diabetes mellitus with hyperglycemia, with long-term current use of insulin ([place])  ? [place], Alix T, NP  ? 4 months ago Gastroesophageal reflux disease without esophagitis  ? [place], [place] T, NP  ? 4 months ago New daily persistent headache  ? [place], NP  ? 6 months ago Type 2 diabetes mellitus with hyperglycemia, with long-term current use of insulin ([place])  ? [facility] [place], Henrine Screws T, NP  ? ?  ?  ?Future Appointments   ? ?        ? In 3 weeks Cannady, Barbaraann Faster, NP [employer]  ? ?  ? ?  ?  ?  ? ?

## 2021-05-18 NOTE — Telephone Encounter (Signed)
Can the patient be changed to a preferred alternative or should we complete the PA for the [place]? Please advise.  ?

## 2021-05-19 ENCOUNTER — Ambulatory Visit
Admission: RE | Admit: 2021-05-19 | Discharge: 2021-05-19 | Disposition: A | Discharge status: Home / Self Care | Payer: No Typology Code available for payment source | Source: Ambulatory Visit | Attending: Internal Medicine | Admitting: Internal Medicine

## 2021-05-19 ENCOUNTER — Encounter: Payer: Self-pay | Admitting: Emergency Medicine

## 2021-05-19 DIAGNOSIS — G43009 Migraine without aura, not intractable, without status migrainosus: Secondary | ICD-10-CM | POA: Insufficient documentation

## 2021-05-19 IMAGING — MR MR HEAD WO/W CM
14 series · 48 of 48 positions shown · IV contrast (7 ml Gadavist)
Comparison: [DATE] brain MRI.

CLINICAL DATA: 55-year-old female with a history of breast cancer
and headaches, with nonspecific dural thickening and enhancement
over the right frontoparietal convexity on [facility]EL[facility] MRI.

EXAM:
MRI HEAD WITHOUT AND WITH CONTRAST
TECHNIQUE: Multiplanar, multiecho pulse sequences of the brain and surrounding
structures were obtained without and with intravenous contrast.
CONTRAST:  7mL GADAVIST GADOBUTROL 1 MMOL/ML IV SOLN

[Series 5: ax dwi_tracew · axial · 3.0mm · 0.65mm/px · z∈[-126,+26]mm · 4 of 48 slices shown]
[im 1/48]
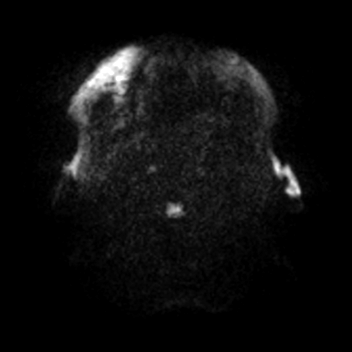
[im 16/48]
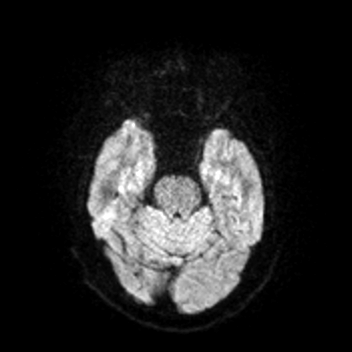
[im 32/48]
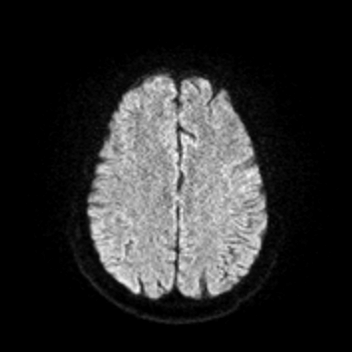
[im 48/48]
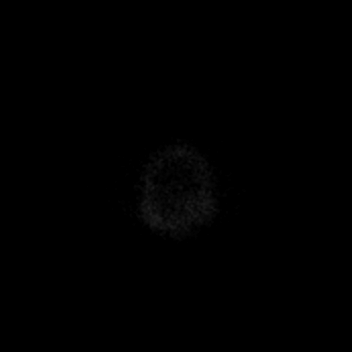

[Series 6: ax dwi_adc · axial · 3.0mm · 0.65mm/px · z∈[-126,+16]mm · 4 of 45 slices shown]
[im 1/45]
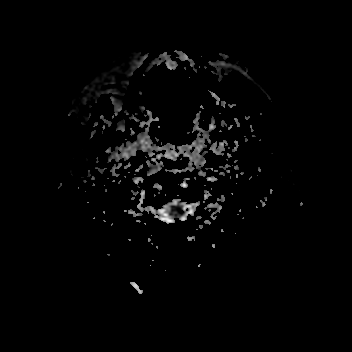
[im 15/45]
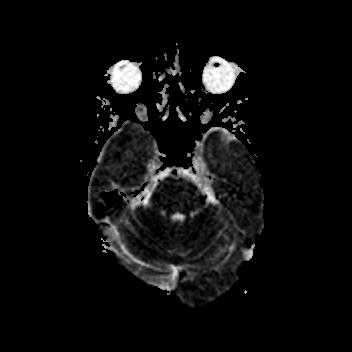
[im 30/45]
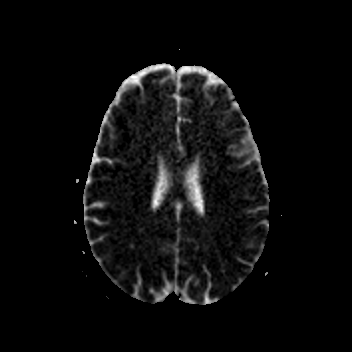
[im 45/45]
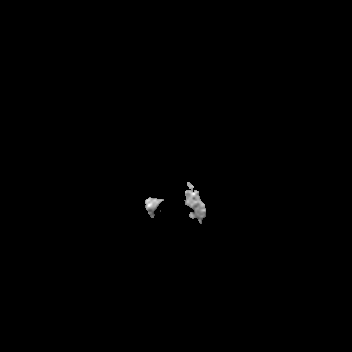

[Series 7: cor dwi_tracew · coronal · 5.0mm · 0.65mm/px · 2 of 40 slices shown]
[im 1/40]
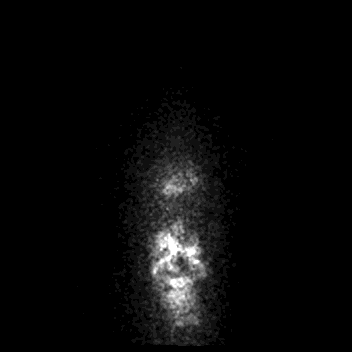
[im 40/40]
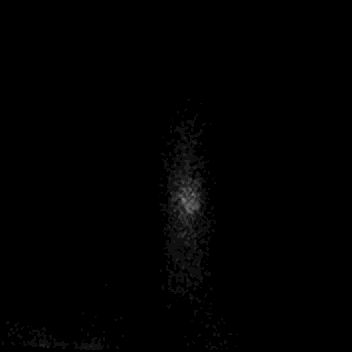

[Series 8: cor dwi_adc · coronal · 5.0mm · 0.65mm/px · 2 of 35 slices shown]
[im 1/35]
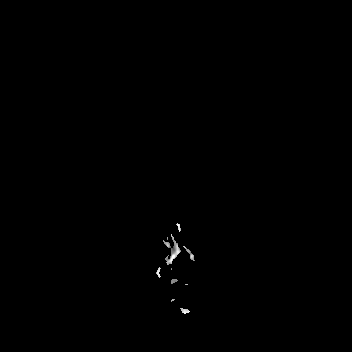
[im 35/35]
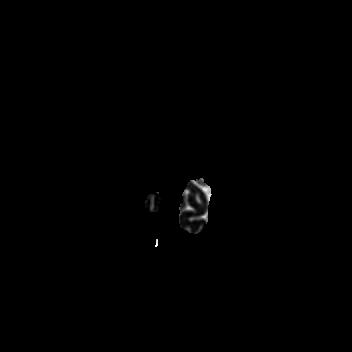

[Series 9: T1 · sagittal · 5.0mm · 0.62mm/px · 1 of 25 slices shown (1 of 2)]
[im 1/25]
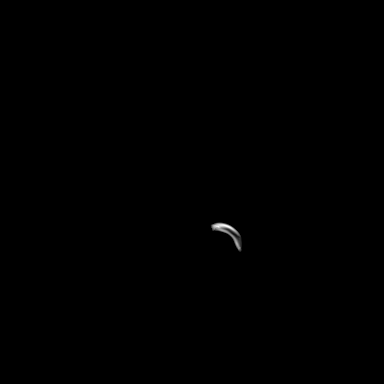

[Series 10: T2 · axial · 5.0mm · 0.53mm/px · 1 of 25 slices shown]
[im 1/25]
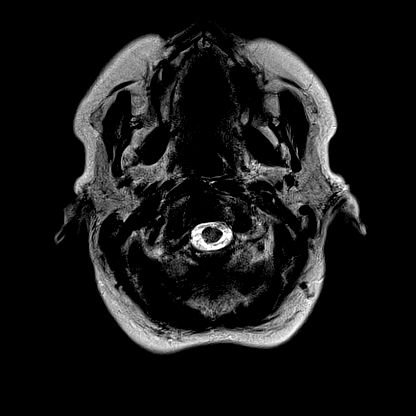

[Series 12: pha_images · axial · 3.0mm · 0.90mm/px · z∈[-132,+38]mm · 3 of 59 slices shown]
[im 1/59]
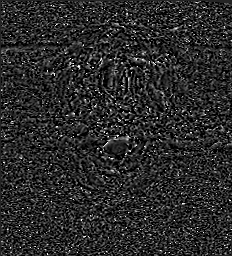
[im 30/59]
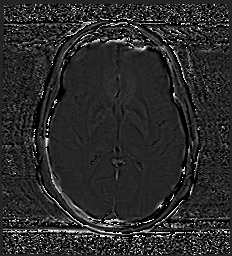
[im 59/59]
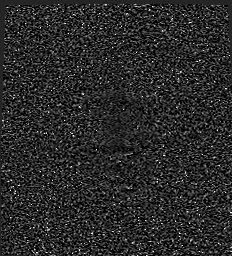

[Series 13: swi_images · axial · 3.0mm · 0.90mm/px · z∈[-132,+40]mm · 3 of 60 slices shown]
[im 1/60]
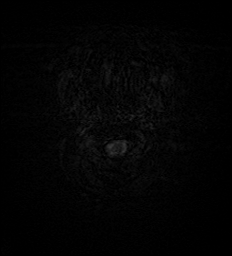
[im 30/60]
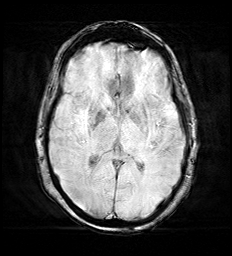
[im 60/60]
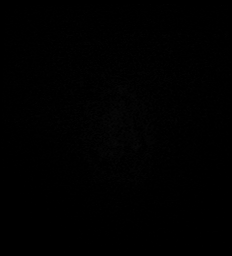

[Series 15: FLAIR · axial · 3.0mm · 0.53mm/px · z∈[-125,+33]mm · 3 of 55 slices shown]
[im 1/55]
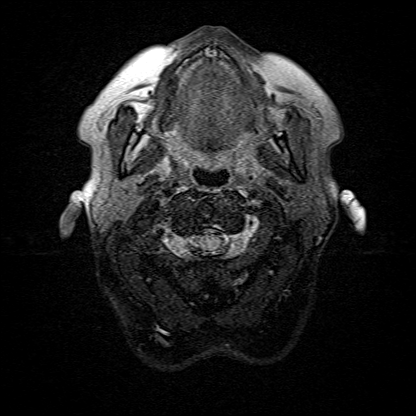
[im 28/55]
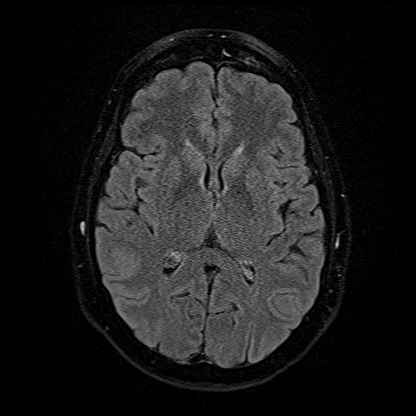
[im 55/55]
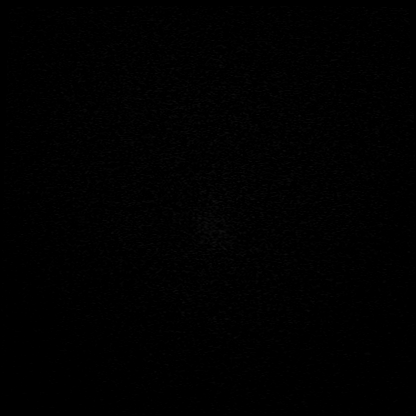

[Series 16: T1 · axial · 1.0mm · 0.98mm/px · z∈[-134,+37]mm · 10 of 175 slices shown (2 of 2)]
[im 1/175]
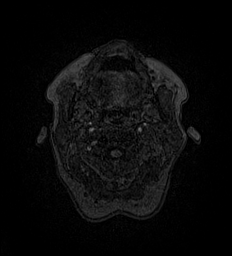
[im 20/175]
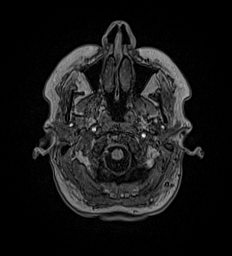
[im 39/175]
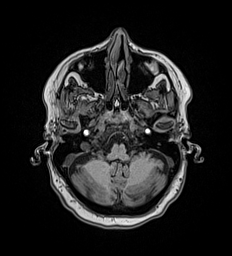
[im 59/175]
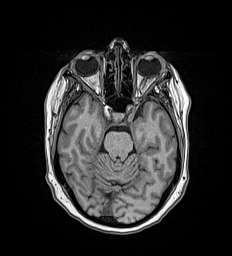
[im 78/175]
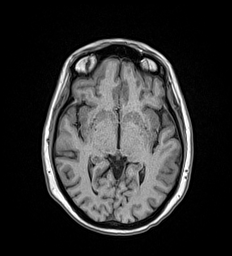
[im 97/175]
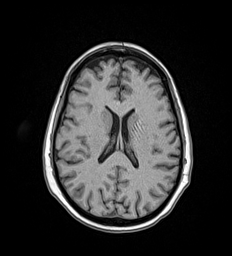
[im 117/175]
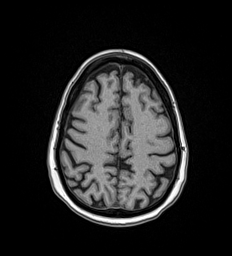
[im 136/175]
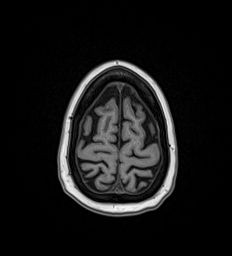
[im 155/175]
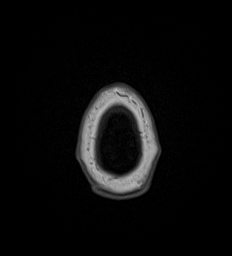
[im 175/175]
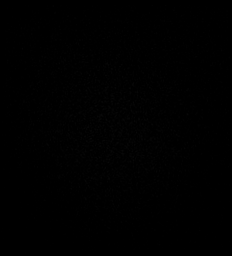

[Series 17: T2 post-contrast · coronal · 5.0mm · 0.57mm/px · 2 of 29 slices shown]
[im 1/29]
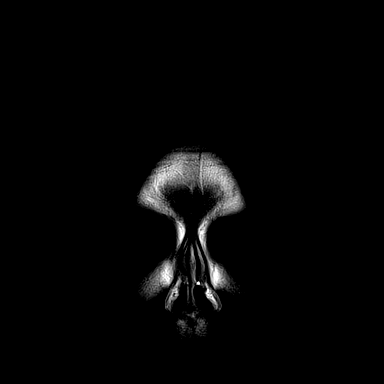
[im 29/29]
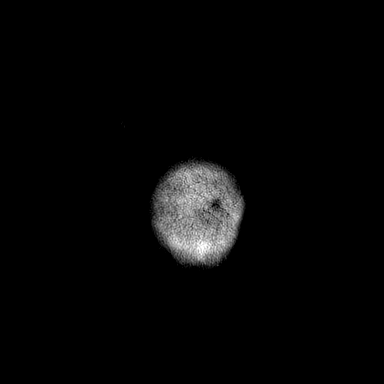

[Series 18: T1 post-contrast · axial · 1.0mm · 0.98mm/px · z∈[-134,+37]mm · 10 of 176 slices shown (1 of 3)]
[im 1/176]
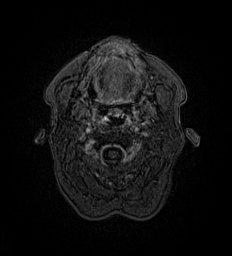
[im 20/176]
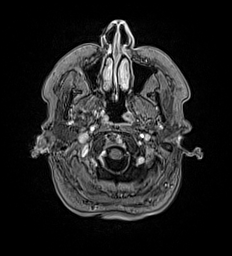
[im 39/176]
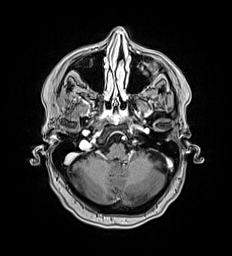
[im 59/176]
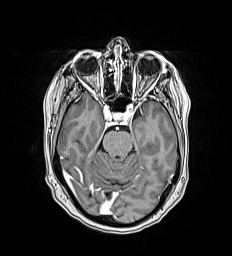
[im 78/176]
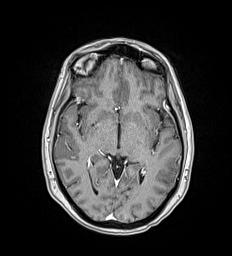
[im 98/176]
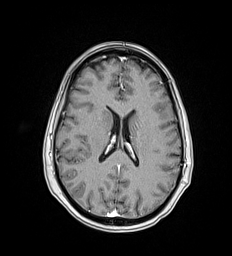
[im 117/176]
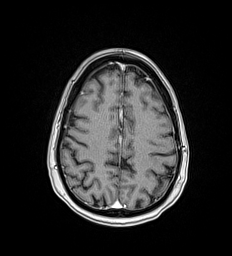
[im 137/176]
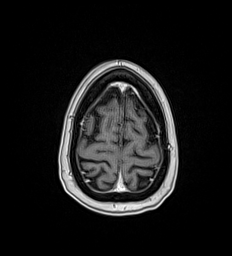
[im 156/176]
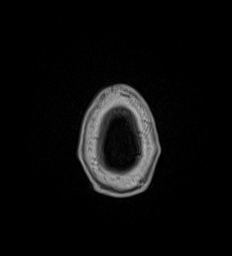
[im 176/176]
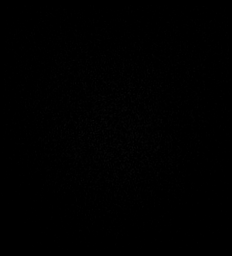

[Series 19: T1 post-contrast · coronal · 5.0mm · 0.57mm/px · 2 of 29 slices shown (2 of 3)]
[im 1/29]
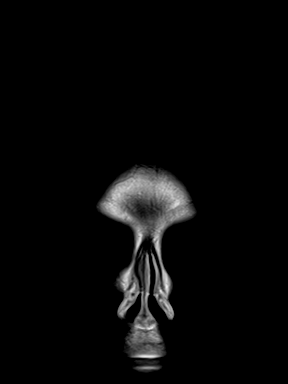
[im 29/29]
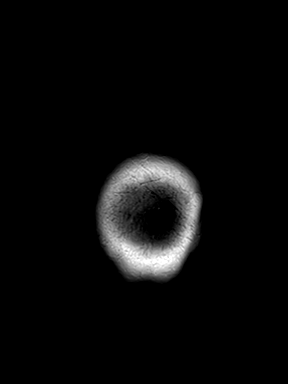

[Series 20: T1 post-contrast · sagittal · 5.0mm · 0.62mm/px · 1 of 24 slices shown (3 of 3)]
[im 1/24]
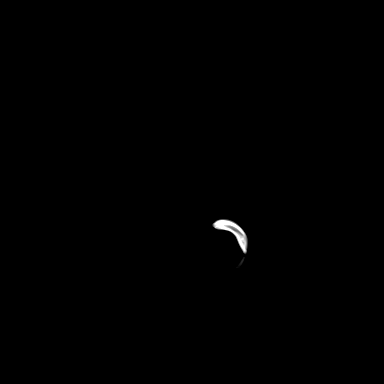

[48 of 48 positions shown; findings below may reference images not displayed]

FINDINGS: Brain: Cerebral volume remains normal. No restricted diffusion to
suggest acute infarction. No midline shift, mass effect, evidence of
mass lesion, ventriculomegaly, extra-axial collection or acute
intracranial hemorrhage. Cervicomedullary junction and pituitary are
within normal limits.

Vertex arachnoid granulations on series 17, image 12 (physiologic).
On postcontrast imaging today there is no convincing asymmetric or
abnormal dural thickening or enhancement (compare series 19, image
14 today to the same series and image in [REDACTED]). No abnormal
enhancement identified. No cerebral edema, and gray and white matter
signal is stable and within normal limits. No chronic cerebral blood
products identified on SWI.

Vascular: Major intracranial vascular flow voids are stable,
dominant distal right vertebral artery. The major dural venous
sinuses are enhancing and appear to be patent.

Skull and upper cervical spine: Bone marrow signal appears stable
with no destructive or suspicious skull or osseous lesion
identified. Negative visible cervical spine and spinal cord.

Sinuses/Orbits: Stable and negative.

Other: Mastoids remain clear. Visible internal auditory structures
appear normal. Negative visible scalp and face.
IMPRESSION: Resolved nonspecific right hemisphere dural thickening and
enhancement seen in [REDACTED].

Normal MRI appearance of the brain now. No metastatic disease or
acute intracranial abnormality.

## 2021-05-19 MED ORDER — GADOBUTROL 1 MMOL/ML IV SOLN
7.0000 mL | Freq: Once | INTRAVENOUS | Status: AC | PRN
  Administered 2021-05-19: 7 mL via INTRAVENOUS

## 2021-05-19 NOTE — Telephone Encounter (Signed)
Attempted PA on Cover My Meds. PA is not required for Basaglar.  ?

## 2021-05-21 ENCOUNTER — Inpatient Hospital Stay: Payer: No Typology Code available for payment source | Attending: Internal Medicine | Admitting: Internal Medicine

## 2021-05-21 ENCOUNTER — Encounter: Payer: Self-pay | Admitting: Internal Medicine

## 2021-05-21 VITALS — BP 111/76 | HR 88 | Temp 98.7°F | Resp 17 | Wt 170.1 lb

## 2021-05-21 DIAGNOSIS — M549 Dorsalgia, unspecified: Secondary | ICD-10-CM | POA: Insufficient documentation

## 2021-05-21 DIAGNOSIS — M2550 Pain in unspecified joint: Secondary | ICD-10-CM | POA: Insufficient documentation

## 2021-05-21 DIAGNOSIS — Z803 Family history of malignant neoplasm of breast: Secondary | ICD-10-CM | POA: Diagnosis not present

## 2021-05-21 DIAGNOSIS — G43009 Migraine without aura, not intractable, without status migrainosus: Secondary | ICD-10-CM | POA: Diagnosis not present

## 2021-05-21 DIAGNOSIS — Z9221 Personal history of antineoplastic chemotherapy: Secondary | ICD-10-CM | POA: Insufficient documentation

## 2021-05-21 DIAGNOSIS — F17210 Nicotine dependence, cigarettes, uncomplicated: Secondary | ICD-10-CM | POA: Diagnosis not present

## 2021-05-21 DIAGNOSIS — Z923 Personal history of irradiation: Secondary | ICD-10-CM | POA: Diagnosis not present

## 2021-05-21 DIAGNOSIS — Z79810 Long term (current) use of selective estrogen receptor modulators (SERMs): Secondary | ICD-10-CM | POA: Diagnosis not present

## 2021-05-21 DIAGNOSIS — Z886 Allergy status to analgesic agent status: Secondary | ICD-10-CM | POA: Insufficient documentation

## 2021-05-21 DIAGNOSIS — Z90721 Acquired absence of ovaries, unilateral: Secondary | ICD-10-CM | POA: Insufficient documentation

## 2021-05-21 DIAGNOSIS — E119 Type 2 diabetes mellitus without complications: Secondary | ICD-10-CM | POA: Insufficient documentation

## 2021-05-21 DIAGNOSIS — Z8042 Family history of malignant neoplasm of prostate: Secondary | ICD-10-CM | POA: Insufficient documentation

## 2021-05-21 DIAGNOSIS — Z79899 Other long term (current) drug therapy: Secondary | ICD-10-CM | POA: Diagnosis not present

## 2021-05-21 DIAGNOSIS — G4700 Insomnia, unspecified: Secondary | ICD-10-CM | POA: Insufficient documentation

## 2021-05-21 DIAGNOSIS — R5383 Other fatigue: Secondary | ICD-10-CM | POA: Insufficient documentation

## 2021-05-21 DIAGNOSIS — Z8379 Family history of other diseases of the digestive system: Secondary | ICD-10-CM | POA: Diagnosis not present

## 2021-05-21 DIAGNOSIS — C50512 Malignant neoplasm of lower-outer quadrant of left female breast: Secondary | ICD-10-CM | POA: Insufficient documentation

## 2021-05-21 DIAGNOSIS — Z800 Family history of malignant neoplasm of digestive organs: Secondary | ICD-10-CM | POA: Diagnosis not present

## 2021-05-21 DIAGNOSIS — Z5111 Encounter for antineoplastic chemotherapy: Secondary | ICD-10-CM | POA: Diagnosis not present

## 2021-05-21 DIAGNOSIS — I890 Lymphedema, not elsewhere classified: Secondary | ICD-10-CM | POA: Insufficient documentation

## 2021-05-21 DIAGNOSIS — Z833 Family history of diabetes mellitus: Secondary | ICD-10-CM | POA: Diagnosis not present

## 2021-05-21 DIAGNOSIS — Z170 Estrogen receptor positive status [ER+]: Secondary | ICD-10-CM | POA: Insufficient documentation

## 2021-05-21 MED ORDER — SUMATRIPTAN SUCCINATE 100 MG PO TABS: 100.0000 mg | ORAL_TABLET | ORAL | 0 refills | Status: DC | PRN
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
## 2021-05-24 ENCOUNTER — Inpatient Hospital Stay: Payer: No Typology Code available for payment source

## 2021-05-25 ENCOUNTER — Inpatient Hospital Stay (HOSPITAL_BASED_OUTPATIENT_CLINIC_OR_DEPARTMENT_OTHER): Payer: No Typology Code available for payment source | Admitting: Internal Medicine

## 2021-05-25 ENCOUNTER — Inpatient Hospital Stay: Payer: No Typology Code available for payment source

## 2021-05-25 ENCOUNTER — Encounter: Payer: Self-pay | Admitting: Internal Medicine

## 2021-05-25 DIAGNOSIS — C50512 Malignant neoplasm of lower-outer quadrant of left female breast: Secondary | ICD-10-CM | POA: Diagnosis not present

## 2021-05-25 DIAGNOSIS — Z170 Estrogen receptor positive status [ER+]: Secondary | ICD-10-CM

## 2021-05-25 DIAGNOSIS — Z95828 Presence of other vascular implants and grafts: Secondary | ICD-10-CM

## 2021-05-25 DIAGNOSIS — Z5111 Encounter for antineoplastic chemotherapy: Secondary | ICD-10-CM | POA: Diagnosis not present

## 2021-05-25 LAB — CBC WITH DIFFERENTIAL/PLATELET
Abs Immature Granulocytes: 0.02 10*3/uL (ref 0.00–0.07)
Basophils Absolute: 0 10*3/uL (ref 0.0–0.1)
Basophils Relative: 0 %
Eosinophils Absolute: 0 10*3/uL (ref 0.0–0.5)
Eosinophils Relative: 0 %
HCT: 39.4 % (ref 36.0–46.0)
Hemoglobin: 13 g/dL (ref 12.0–15.0)
Immature Granulocytes: 0 %
Lymphocytes Relative: 26 %
Lymphs Abs: 1.8 10*3/uL (ref 0.7–4.0)
MCH: 31.9 pg (ref 26.0–34.0)
MCHC: 33 g/dL (ref 30.0–36.0)
MCV: 96.6 fL (ref 80.0–100.0)
Monocytes Absolute: 0.4 10*3/uL (ref 0.1–1.0)
Monocytes Relative: 6 %
Neutro Abs: 4.6 10*3/uL (ref 1.7–7.7)
Neutrophils Relative %: 68 %
Platelets: 195 10*3/uL (ref 150–400)
RBC: 4.08 MIL/uL (ref 3.87–5.11)
RDW: 12.3 % (ref 11.5–15.5)
WBC: 6.9 10*3/uL (ref 4.0–10.5)
nRBC: 0 % (ref 0.0–0.2)

## 2021-05-25 LAB — COMPREHENSIVE METABOLIC PANEL
ALT: 15 U/L (ref 0–44)
AST: 18 U/L (ref 15–41)
Albumin: 3.6 g/dL (ref 3.5–5.0)
Alkaline Phosphatase: 63 U/L (ref 38–126)
Anion gap: 9 (ref 5–15)
BUN: 10 mg/dL (ref 6–20)
CO2: 24 mmol/L (ref 22–32)
Calcium: 8.8 mg/dL — ABNORMAL LOW (ref 8.9–10.3)
Chloride: 101 mmol/L (ref 98–111)
Creatinine, Ser: 0.62 mg/dL (ref 0.44–1.00)
GFR, Estimated: >60 mL/min (ref >60)
Glucose, Bld: 293 mg/dL — ABNORMAL HIGH (ref 70–99)
Potassium: 3.8 mmol/L (ref 3.5–5.1)
Sodium: 134 mmol/L — ABNORMAL LOW (ref 135–145)
Total Bilirubin: 0.4 mg/dL (ref 0.3–1.2)
Total Protein: 6.4 g/dL — ABNORMAL LOW (ref 6.5–8.1)

## 2021-05-25 MED ORDER — HEPARIN SOD (PORK) LOCK FLUSH 100 UNIT/ML IV SOLN
500.0000 [IU] | Freq: Once | INTRAVENOUS | Status: AC
  Administered 2021-05-25: 500 [IU] via INTRAVENOUS
  Filled 2021-05-25: qty 5

## 2021-05-25 MED ORDER — SODIUM CHLORIDE 0.9% FLUSH
10.0000 mL | Freq: Once | INTRAVENOUS | Status: AC
  Administered 2021-05-25: 10 mL via INTRAVENOUS
  Filled 2021-05-25: qty 10

## 2021-05-25 MED ORDER — GOSERELIN ACETATE 10.8 MG ~~LOC~~ IMPL
10.8000 mg | DRUG_IMPLANT | Freq: Once | SUBCUTANEOUS | Status: DC
  Filled 2021-05-25: qty 10.8

## 2021-05-25 NOTE — Progress Notes (Signed)
Carolyn Roy CONSULT NOTE  Patient Care Team: [name], NP as PCP - General (Nurse Practitioner) Bary Castilla [name], MD (General Surgery) [name], [name], [name] (Obstetrics and Gynecology) Cammie Sickle, MD as Consulting Physician (Oncology) Vanita Ingles, RN as Case Manager (General Practice) Lovell Sheehan, MD as Consulting Physician (Orthopedic Surgery)  CHIEF COMPLAINTS/PURPOSE OF CONSULTATION: Breast cancer  Oncology History Overview Note  # MAY 2019-  clinical stage IIIA (T3N1Mx) left breast cancer s/p biopsy on 06/14/2017. -Pathology revealed grade III invasive ductal carcinoma. -Axillary FNA revealed malignant cells c/w metastatic carcinoma. Tumor was ER + (90%), PR + (30%), Her2/neu - and Ki67 70%.  CA27.29 was 7.8 on 06/14/2017.  # She received 4 cycles of AC with Neulasta support (07/20/2017 - 08/31/2017).;  neoadjuvant Taxol on 09/14/2017.  #DEC 2019- Lumpectomy/sentinel lymph node biopsy [Dr.[name]]-complete pathologic response  # s/p RT [delayed sec to wound infection; Dr.[name]] finished RT [4/12]  # April 14th 2020- START TAM; stopped in mid-May secondary intolerance [severe migraines].  # 18th May 2020-start Arimidex [hormonal profile-postmenopausal;add Zoladex q3M]; STOPPED sec intolerance/joint pain; NOV 2021- STARTED AROMASIN; Stopped x 2 months sec to extreme fatigue/severe joint pains.   # July 2022- START tamoxifen 10 mg a day.  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 Secord/Berchuck]-negative for malignancy/ # DM-2- poorly controlled.   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   # PAP SMEAR- RECOMMENDED 2022- summer  -------------------------------------------  DIAGNOSIS: left breast cancer  STAGE:  III       ;GOALS: cure  CURRENT/MOST RECENT THERAPY Tam    Cancer  of midline of breast, left (HCC) (Resolved)  06/15/2017 Initial Diagnosis   Cancer of midline of breast, left ([place])    07/20/2017 - 11/16/2017 Chemotherapy   The patient had dexamethasone (DECADRON) 4 MG tablet, 1 of 1 cycle, Start date: --, End date: -- DOXOrubicin (ADRIAMYCIN) chemo injection 122 mg, 60 mg/m2 = 122 mg, Intravenous,  Once, 4 of 4 cycles Administration: 122 mg (07/20/2017), 122 mg (08/03/2017), 122 mg (08/17/2017), 122 mg (08/31/2017) palonosetron (ALOXI) injection 0.25 mg, 0.25 mg, Intravenous,  Once, 4 of 4 cycles Administration: 0.25 mg (07/20/2017), 0.25 mg (08/03/2017), 0.25 mg (08/17/2017), 0.25 mg (08/31/2017) pegfilgrastim (NEULASTA) injection 6 mg, 6 mg, Subcutaneous, Once, 5 of 5 cycles Administration: 6 mg (07/21/2017), 6 mg (08/04/2017), 6 mg (08/18/2017), 6 mg (09/01/2017) cyclophosphamide (CYTOXAN) 1,220 mg in sodium chloride 0.9 % 250 mL chemo infusion, 600 mg/m2 = 1,220 mg, Intravenous,  Once, 4 of 4 cycles Administration: 1,220 mg (07/20/2017), 1,220 mg (08/03/2017), 1,220 mg (08/17/2017), 1,220 mg (08/31/2017) PACLitaxel (TAXOL) 162 mg in sodium chloride 0.9 % 250 mL chemo infusion (</= 42m/m2), 80 mg/m2 = 162 mg, Intravenous,  Once, 10 of 12 cycles Dose modification: 65 mg/m2 (original dose 80 mg/m2, Cycle 13, Reason: Provider Judgment, Comment: neuropathy) Administration: 162 mg (09/14/2017), 162 mg (09/21/2017), 162 mg (09/28/2017), 162 mg (10/05/2017), 162 mg (10/12/2017), 162 mg (10/19/2017), 162 mg (10/26/2017), 132 mg (11/02/2017), 132 mg (11/09/2017), 132 mg (11/16/2017) fosaprepitant (EMEND) 150 mg, dexamethasone (DECADRON) 12 mg in sodium chloride 0.9 % 145 mL IVPB, , Intravenous,  Once, 4 of 4 cycles Administration:  (07/20/2017),  (08/03/2017),  (08/17/2017),  (08/31/2017)   for chemotherapy treatment.     Malignant neoplasm of lower-outer quadrant of left breast of female, estrogen receptor positive ([place]  11/08/2017 Initial Diagnosis   Malignant neoplasm of lower-outer  quadrant of left breast of female, estrogen receptor positive (HCC)     HISTORY OF PRESENTING ILLNESS: Ambulating independently.  Alone.  Carolyn Roy 56 y.o.  female with a history of stage III ER PR positive HER-2/neu negative breast cancer currently on tamoxifen 10 mg a day [intolerance] plus Zoladexq3 /Zometa every 6 months is here for follow-up.  Patient denies any new lumps or bumps.  Appetite is good.  No weight loss. Patient has been started on amitriptyline; by neurology.  She continues to be on Cymbalta.    Review of Systems  Constitutional:  Positive for malaise/fatigue. Negative for chills, diaphoresis, fever and weight loss.  HENT:  Negative for nosebleeds and sore throat.   Eyes:  Negative for double vision.  Respiratory:  Negative for cough, hemoptysis, sputum production, shortness of breath and wheezing.   Cardiovascular:  Negative for chest pain, palpitations, orthopnea and leg swelling.  Gastrointestinal:  Negative for abdominal pain, blood in stool, constipation, diarrhea, heartburn, melena, nausea and vomiting.  Genitourinary:  Negative for dysuria, frequency and urgency.  Musculoskeletal:  Positive for back pain and joint pain.  Skin: Negative.  Negative for itching and rash.  Neurological:  Positive for tingling. Negative for dizziness, focal weakness, weakness and headaches.  Endo/Heme/Allergies:  Does not bruise/bleed easily.  Psychiatric/Behavioral:  Negative for depression. The patient is not nervous/anxious and does not have insomnia.     MEDICAL HISTORY:  Past Medical History:  Diagnosis Date   Allergy    Breast cancer ([place])    Cancer ([place]) 06/15/2017   5.1 cm, T3,N1 (clinical): ER/ PR positive, Her 2 neu not overexpressed, High Ki 67. Neuoadjuvant chemotherapy.    Depression    Diabetes mellitus without complication ([place]) 8756   Edema of left upper extremity    Endometriosis    Family history of breast cancer    Headache    migraines    Hyperlipidemia    Lymphedema of left arm    Ovarian mass    Personal history of chemotherapy    Personal history of radiation therapy    Pneumonia    2018    SURGICAL HISTORY: Past Surgical History:  Procedure Laterality Date   AXILLARY LYMPH NODE BIOPSY Left 07/14/2017   Procedure: INSERTION GEL MARK CLIP LEFT AXILLA;  Surgeon: Robert Bellow, MD;  Location: [facility] OR[facility];  Service: General;  Laterality: Left;   BREAST BIOPSY Left    Dr Orlene Och BREAST METASTATIC CARCINOMA   BREAST LUMPECTOMY Left 01/12/2018   COLONOSCOPY WITH PROPOFOL N/A 12/27/2019   Procedure: COLONOSCOPY WITH PROPOFOL;  Surgeon: Virgel Manifold, MD;  Location: [facility];  Service: Endoscopy;  Laterality: N/A;   OOPHORECTOMY     PARTIAL MASTECTOMY WITH NEEDLE LOCALIZATION Left 01/12/2018   Procedure: PARTIAL MASTECTOMY WITH NEEDLE LOCALIZATION;  Surgeon: Robert Bellow, MD;  Location: [facility] OR[facility];  Service: General;  Laterality: Left;   PORTACATH PLACEMENT Right 07/14/2017   Procedure: INSERTION PORT-A-CATH;  Surgeon: Robert Bellow, MD;  Location: [facility] OR[facility];  Service: General;  Laterality: Right;   SENTINEL NODE BIOPSY Left 01/12/2018   Procedure: SENTINEL NODE BIOPSY;  Surgeon: Robert Bellow, MD;  Location: [facility];  Service: General;  Laterality: Left;   TUBAL LIGATION      SOCIAL HISTORY: Social History   Socioeconomic History   Marital status: Married    Spouse name: Not on file   Number of children: Not on file  Years of education: Not on file   Highest education level: Not on file  Occupational History   Not on file  Tobacco Use   Smoking status: Every Day    Packs/day: 1.00    Years: 11.00    Pack years: 11.00    Types: Cigarettes   Smokeless tobacco: Former    Types: Snuff  Vaping Use   Vaping Use: Never used  Substance and Sexual Activity   Alcohol use: No    Alcohol/week: 0.0 standard drinks   Drug use: No   Sexual activity: Yes  Other Topics Concern   Not on  file  Social History Narrative   Not on file   Social Determinants of Health   Financial Resource Strain: Not on file  Food Insecurity: Not on file  Transportation Needs: Not on file  Physical Activity: Not on file  Stress: Not on file  Social Connections: Not on file  Intimate Partner Violence: Not on file    FAMILY HISTORY: Family History  Problem Relation Age of Onset   Colon cancer Mother    Cancer Mother    Other Father        family hx on dad's side: breast, colon, stomach cancer-biological dad   Diabetes Brother    Pancreatitis Brother    Prostate cancer Brother 83       currently 60 / maternal half-brother   Breast cancer Maternal Aunt 70       currently 12   Breast cancer Maternal Grandmother 33       deceased 47s   Colon cancer Maternal Grandmother    Breast cancer Other 4       mother's sister; deceased 100   Breast cancer Other        mother's sister; age at dx unknown    ALLERGIES:  is allergic to aspirin.  MEDICATIONS:  Current Outpatient Medications  Medication Sig Dispense Refill   amitriptyline (ELAVIL) 50 MG tablet TAKE 1 TABLET BY MOUTH EVERYDAY AT BEDTIME 90 tablet 2   aspirin-acetaminophen-caffeine (EXCEDRIN MIGRAINE) 250-250-65 MG tablet Take 2 tablets by mouth daily as needed for headache.      Blood Glucose Monitoring Suppl (ONETOUCH VERIO) w/Device KIT Use to check blood sugar 3 times a day and document results, bring to appointments.  Goal is <130 fasting blood sugar and <180 two hours after meals. 1 kit 0   Dulaglutide (TRULICITY) 9.76 BH/4.1PF SOPN Inject 0.75 mg into the skin once a week. 0.5 mL 4   DULoxetine (CYMBALTA) 60 MG capsule Take 60 mg by mouth daily.     famotidine (PEPCID) 20 MG tablet TAKE 1 TABLET BY MOUTH EVERY DAY 90 tablet 2   glucose blood test strip Use to check blood sugar 3 times daily, fasting in morning with goal <130 and 2 hours after meals with goal <180.  Bring blood sugar log to visits. 100 each 12   goserelin  (ZOLADEX) 3.6 MG injection Inject 3.6 mg into the skin every 28 (twenty-eight) days.     Insulin Glargine (BASAGLAR KWIKPEN) 100 UNIT/ML Inject 10 Units into the skin daily. 15 mL 4   Insulin Glargine (BASAGLAR KWIKPEN) 100 UNIT/ML Inject into the skin daily.     Insulin Pen Needle (NOVOFINE) 30G X 8 MM MISC Inject 10 each into the skin as needed. 100 each 4   JARDIANCE 25 MG TABS tablet TAKE 1 TABLET BY MOUTH EVERY DAY 90 tablet 4   metFORMIN (GLUCOPHAGE-XR) 500 MG 24 hr tablet  Start by taking one tablet (500 MG) twice a day with meals and then increase in one week to two tablets (1000 MG) twice a day with meals. 360 tablet 4   metoCLOPramide (REGLAN) 5 MG tablet TAKE 1 TABLET BY MOUTH 4 TIMES DAILY. 56 tablet 0   oxyCODONE (OXY IR/ROXICODONE) 5 MG immediate release tablet Take 1 tablet (5 mg total) by mouth 2 (two) times daily as needed for severe pain. Must last 30 days 60 tablet 0   rosuvastatin (CRESTOR) 40 MG tablet Take 1 tablet (40 mg total) by mouth daily. Stop taking Atorvastatin. 90 tablet 4   SUMAtriptan (IMITREX) 100 MG tablet Take 1 tablet (100 mg total) by mouth every 2 (two) hours as needed for migraine. May repeat in 2 hours if headache persists or recurs. 10 tablet 0   tamoxifen (NOLVADEX) 10 MG tablet TAKE 1 TABLET BY MOUTH EVERY DAY 90 tablet 1   Vitamin D, Ergocalciferol, (DRISDOL) 1.25 MG (50000 UNIT) CAPS capsule TAKE 1 CAPSULE BY MOUTH ONE TIME PER WEEK 12 capsule 1   oxyCODONE (OXY IR/ROXICODONE) 5 MG immediate release tablet Take 1 tablet (5 mg total) by mouth 2 (two) times daily as needed for severe pain. Must last 30 days 60 tablet 0   oxyCODONE (OXY IR/ROXICODONE) 5 MG immediate release tablet Take 1 tablet (5 mg total) by mouth 2 (two) times daily as needed for severe pain. Must last 30 days 60 tablet 0   No current facility-administered medications for this visit.   Facility-Administered Medications Ordered in Other Visits  Medication Dose Route Frequency Provider  Last Rate Last Admin   goserelin (ZOLADEX) injection 10.8 mg  10.8 mg Subcutaneous Once Charlaine Dalton R, MD       heparin lock flush 100 unit/mL  500 Units Intravenous Once Corcoran, Melissa C, MD       sodium chloride flush (NS) 0.9 % injection 10 mL  10 mL Intravenous Once Lequita Asal, MD          .  PHYSICAL EXAMINATION: ECOG PERFORMANCE STATUS: 0 - Asymptomatic  Vitals:   05/25/21 1326  BP: 95/63  Pulse: 90  Temp: (!) 97.4 F (36.3 C)  SpO2: 97%   Filed Weights   05/25/21 1326  Weight: 168 lb 6.4 oz (76.4 kg)    Physical Exam HENT:     Head: Normocephalic and atraumatic.     Mouth/Throat:     Pharynx: No oropharyngeal exudate.  Eyes:     Pupils: Pupils are equal, round, and reactive to light.  Cardiovascular:     Rate and Rhythm: Normal rate and regular rhythm.  Pulmonary:     Effort: No respiratory distress.     Breath sounds: No wheezing.  Abdominal:     General: Bowel sounds are normal. There is no distension.     Palpations: Abdomen is soft. There is no mass.     Tenderness: There is no abdominal tenderness. There is no guarding or rebound.  Musculoskeletal:        General: No tenderness. Normal range of motion.     Cervical back: Normal range of motion and neck supple.  Skin:    General: Skin is warm.     Comments: Right and left BREAST exam [in the presence of nurse]- no unusual skin changes or dominant masses felt. Surgical scars noted.    Neurological:     Mental Status: She is alert and oriented to person, place, and time.  Psychiatric:  Mood and Affect: Affect normal.     LABORATORY DATA:  I have reviewed the data as listed Lab Results  Component Value Date   WBC 6.9 05/25/2021   HGB 13.0 05/25/2021   HCT 39.4 05/25/2021   MCV 96.6 05/25/2021   PLT 195 05/25/2021   Recent Labs    12/01/20 1834 12/21/20 1509 02/22/21 1259 05/14/21 1145 05/25/21 1320  NA 139   < > 135 136 134*  K 4.1   < > 3.3* 3.9 3.8  CL 105    < > 99 98 101  CO2 27   < > _0 GLUCOSE 264*   < > 151* 333* 293*  BUN 12   < > _1 CREATININE 0.59   < > 0.53 0.60 0.62  CALCIUM 9.3   < > 9.3 9.1 8.8*  GFRNONAA >60  --  >60  --  >60  PROT 7.5   < > 7.0 6.2 6.4*  ALBUMIN 4.1   < > 4.2 4.1 3.6  AST 17   < > _2 ALT 17   < > _3 ALKPHOS 60   < > 61 97 63  BILITOT 0.7   < > 0.7 0.4 0.4   < > = values in this interval not displayed.    RADIOGRAPHIC STUDIES: I have personally reviewed the radiological images as listed and agreed with the findings in the report. MR BRAIN W WO CONTRAST  Result Date: 05/20/2021 CLINICAL DATA:  56 year old female with a history of breast cancer and headaches, with nonspecific dural thickening and enhancement over the right frontoparietal convexity on November MRI. EXAM: MRI HEAD WITHOUT AND WITH CONTRAST TECHNIQUE: Multiplanar, multiecho pulse sequences of the brain and surrounding structures were obtained without and with intravenous contrast. CONTRAST:  40m GADAVIST GADOBUTROL 1 MMOL/ML IV SOLN COMPARISON:  01/06/2021 brain MRI. FINDINGS: Brain: Cerebral volume remains normal. No restricted diffusion to suggest acute infarction. No midline shift, mass effect, evidence of mass lesion, ventriculomegaly, extra-axial collection or acute intracranial hemorrhage. Cervicomedullary junction and pituitary are within normal limits. Vertex arachnoid granulations on series 17, image 12 (physiologic). On postcontrast imaging today there is no convincing asymmetric or abnormal dural thickening or enhancement (compare series 19, image 14 today to the same series and image in November). No abnormal enhancement identified. No cerebral edema, and gray and white matter signal is stable and within normal limits. No chronic cerebral blood products identified on SWI. Vascular: Major intracranial vascular flow voids are stable, dominant distal right vertebral artery. The major dural venous sinuses are enhancing and  appear to be patent. Skull and upper cervical spine: Bone marrow signal appears stable with no destructive or suspicious skull or osseous lesion identified. Negative visible cervical spine and spinal cord. Sinuses/Orbits: Stable and negative. Other: Mastoids remain clear. Visible internal auditory structures appear normal. Negative visible scalp and face. IMPRESSION: Resolved nonspecific right hemisphere dural thickening and enhancement seen in November. Normal MRI appearance of the brain now. No metastatic disease or acute intracranial abnormality. Electronically Signed   By: HGenevie AnnM.D.   On: 05/20/2021 09:05    ASSESSMENT & PLAN:   Malignant neoplasm of lower-outer quadrant of left breast of female, estrogen receptor positive ([place] # Breast cancer Stage III ER/PR pos her 2 NEG. currently on Tamoxifen+ Zoldadex.  No clinical evidence of recurrence; STABLE. adjuvant Zometa q6M; Zoladex today q3M.  Mammogram August 2022- WNL.   # Currently on  Tamoxifen 10 mg [lower dose; previous intolerance to 20 mg/AI;]. STABLE.   # Depression/ Insomnia--STABLE- On cymblata/Amiytrplitine qhs /Dr.vaslow[DI-none as per uptodate*] Dr.kapur]  # Joint pains-multifactorial- Improved off Lyrica [As per pt ]; STABLE-  # PN-2-3 [Pain doc] on neurontin- improved/STABLE-  #Lymphedema-left chest walls/p physical therapy- STABLE-  # DM-2 on insulin- STABLE [A1c-6.8]Blood glucose 293 [PP]-;   Defer to PCP  # Port malfunction: flush; s/p TPA-STABLE.   Zometa q82m zola 3 m  # DISPOSITION: # Zoladex;  # Follow up in 3 month;  MD;cbc/cmp/ca-27-29; Zoladex; Zometa- port flush. Dr.B  All questions were answered. The patient knows to call the clinic with any problems, questions or concerns.    GCammie Sickle MD 05/25/2021 2:15 PM

## 2021-05-25 NOTE — Assessment & Plan Note (Addendum)
#   Breast cancer Stage III ER/PR pos her 2 NEG. currently on Tamoxifen+ Zoldadex.  No clinical evidence of recurrence; STABLE. adjuvant Zometa q6M; Zoladex today q3M.  Mammogram August 2022- WNL.  ? ?# Currently on Tamoxifen 10 mg [lower dose; previous intolerance to 20 mg/AI;]. STABLE.  ? ?# Depression/ Insomnia--STABLE- On cymblata/Amiytrplitine qhs /Dr.vaslow[DI-none as per uptodate*] Dr.kapur] ? ?# Joint pains-multifactorial- Improved off Lyrica [As per pt ]; STABLE- ? 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 ?#Lymphedema-left chest walls/p physical therapy- STABLE- ? ?# DM-2 on insulin- STABLE [A1c-6.8]Blood glucose 293 [PP]-;   Defer to PCP ? ?# Port malfunction: flush; s/p TPA-STABLE.  ? ?Zometa q32m zola 3 m ? ?# DISPOSITION: ?# Zoladex;  ?# Follow up in 3 month;  MD;cbc/cmp/ca-27-29; Zoladex; Zometa- port flush. Dr.B ?

## 2021-05-26 LAB — CANCER ANTIGEN 27.29: CA 27.29: <9 U/mL (ref 0.0–38.6)

## 2021-05-27 ENCOUNTER — Other Ambulatory Visit: Payer: Self-pay | Admitting: Nurse Practitioner

## 2021-05-27 NOTE — Telephone Encounter (Signed)
Medication Refill - Medication: Insulin Glargine (BASAGLAR KWIKPEN) 100 UNIT/ML [[id number]]  ?Pt is calling to report that the pharmacy has not received the medication above can this please be resent? ? ?Has the patient contacted their pharmacy? Yes.   ?(Agent: If no, request that the patient contact the pharmacy for the refill. If patient does not wish to contact the pharmacy document the reason why and proceed with request.) ?(Agent: If yes, when and what did the pharmacy advise?) ? ? ?Preferred Pharmacy (with phone number or street name):  ?[id number] [place] - [address]  [address] ?Phone: [phone]: [phone] ?Hours: Not open 24 hours  ? ?Has the patient been seen for an appointment in the last year OR does the patient have an upcoming appointment? Yes.  05/14/21 ? ?Agent: Please be advised that RX refills may take up to 3 business days. We ask that you follow-up with your pharmacy. ? ?

## 2021-05-28 NOTE — Telephone Encounter (Signed)
last RF 05/14/21 15 ml/ 4 RF to soon ? ?Requested Prescriptions  ?Refused Prescriptions Disp Refills  ?? Insulin Glargine (BASAGLAR KWIKPEN) 100 UNIT/ML 15 mL 4  ?  Sig: Inject 10 Units into the skin daily.  ?  ? Endocrinology:  Diabetes - Insulins Failed - 05/27/2021  4:15 PM  ?  ?  Failed - HBA1C is between 0 and 7.9 and within 180 days  ?  HB A1C (BAYER DCA - WAIVED)  ?Date Value Ref Range Status  ?05/14/2021 10.0 (H) 4.8 - 5.6 % Final  ?  Comment:  ?           Prediabetes: 5.7 - 6.4 ?         Diabetes: >6.4 ?         Glycemic control for adults with diabetes: <7.0 ?  ?   ?  ?  Passed - Valid encounter within last 6 months  ?  Recent Outpatient Visits   ?      ? 2 weeks ago Type 2 diabetes mellitus with hyperglycemia, with long-term current use of insulin ([place])  ? [place], [place] T, NP  ? 3 months ago Type 2 diabetes mellitus with hyperglycemia, with long-term current use of insulin ([place])  ? [place], [place] T, NP  ? 5 months ago Gastroesophageal reflux disease without esophagitis  ? [place], [place] T, NP  ? 5 months ago New daily persistent headache  ? [place], NP  ? 6 months ago Type 2 diabetes mellitus with hyperglycemia, with long-term current use of insulin ([place])  ? [facility] [place], Henrine Screws T, NP  ?  ?  ?Future Appointments   ?        ? In 2 weeks [place], Barbaraann Faster, NP [employer]  ?  ? ?  ?  ?  ? ? ?

## 2021-05-30 NOTE — Progress Notes (Signed)
PROVIDER NOTE: Information contained herein reflects review and annotations entered in association with encounter. Interpretation of such information and data should be left to medically-trained personnel. Information provided to patient can be located elsewhere in the medical record under "Patient Instructions". Document created using STT-dictation technology, any transcriptional errors that may result from process are unintentional.    Patient: Carolyn Roy  Service Category: E/M  Provider: Gaspar Cola, MD  DOB: 1965/11/19  DOS: 05/31/2021  Specialty: Interventional Pain Management  MRN: [id number]  Setting: Ambulatory outpatient  PCP: [name], NP  Type: Established Patient    Referring Provider: [name], NP  Location: Office  Delivery: Face-to-face     HPI  Carolyn Roy, a 56 y.o. year old female, is here today because of her Chronic pain syndrome [G89.4]. Carolyn Roy primary complain today is Knee Pain (right) and Hip Pain (right) Last encounter: My last encounter with her was on 03/03/2021. Pertinent problems: Carolyn Roy has Chronic fatigue; Family history of breast cancer; Chemotherapy-induced neuropathy ([place]); Malignant neoplasm of lower-outer quadrant of left breast of female, estrogen receptor positive ([place]); Chronic feet pain (1ry area of Pain) (Bilateral) (R>L); Neuropathic pain of feet (Bilateral); Chronic knee pain (2ry area of Pain) (Bilateral) (R>L); Chronic hand pain (3ry area of Pain) (Bilateral) (R>L); Chronic pain syndrome; Chronic hip pain (4th area of Pain) (Bilateral) (R>L); Chronic upper extremity pain (3ry area of Pain) (Bilateral) (R>L); Cancer-related pain; Neuropathic pain; Osteoarthritis of knee (Right); Chronic low back pain (Bilateral (L>R) w/o sciatica; Chronic hip pain (Left); Lumbar facet syndrome (Bilateral) (L>R); Idiopathic scoliosis; Chronic sacroiliac joint pain (Left); Chronic pain of knee (Right); Abnormal MRI, lumbar spine  (04/20/2018); Lumbar facet arthropathy; Spondylosis without myelopathy or radiculopathy, lumbosacral region; DDD (degenerative disc disease), lumbosacral; Numbness and tingling of upper extremity (C6/C7 dermatomes) (Right); Personal history of breast cancer; Cervicalgia; Cervical radiculitis (Right); Effusion of knee joint (Right); Tricompartment osteoarthritis of knee (Right); 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 Pain Assessment: Severity of Chronic pain is reported as a 5 /10. Location: Knee Right/denies. Onset: More than a month ago. Quality: Dull, Burning. Timing: Intermittent. Modifying factor(s): Oxycodone, Ibuprofen, Tylenol, ice, heat. Vitals:  height is _0  (1.626 m) and weight is 164 lb (74.4 kg). Her temporal temperature is 97.3 F (36.3 C) (abnormal). Her blood pressure is 108/70 and her pulse is 92. Her respiration is 14 and oxygen saturation is 98%.   Reason for encounter: medication management.   The patient indicates doing well with the current medication regimen. No adverse reactions or side effects reported to the medications.   RTCB: 09/02/2021 Nonopioids transfer 02/03/2020: Pregabalin (Lyrica) and alpha-lipoic acid  Pharmacotherapy Assessment  Analgesic: Oxycodone IR 5 mg, 1 tab PO BID (10 mg/day of oxycodone) (Average: 2 tabs/day = 10 mg/day of oxycodone) MME/day: 15 mg/day.   Monitoring: [place] PMP: PDMP reviewed during this encounter.       Pharmacotherapy: No side-effects or adverse reactions reported. Compliance: No problems identified. Effectiveness: Clinically acceptable.  [name], RN  05/31/2021  2:04 PM  Sign when Signing Visit Nursing Pain Medication Assessment:  Safety precautions to be maintained throughout the outpatient stay will include: orient to surroundings, keep bed in low position, maintain call bell within  reach at all times, provide assistance with transfer out of bed and ambulation.  Medication Inspection Compliance: Pill count  conducted under aseptic conditions, in front of the patient. Neither the pills nor the bottle was removed from the patient's sight at any time. Once count was completed pills were immediately returned to the patient in their original bottle.  Medication: Oxycodone IR Pill/Patch Count:  24 of 60 pills remain Pill/Patch Appearance: Markings consistent with prescribed medication Bottle Appearance: Standard pharmacy container. Clearly labeled. Filled Date: 04 / 05 / 2023 Last Medication intake:  Today    UDS:  Summary  Date Value Ref Range Status  07/27/2020 Note  Final    Comment:    ==================================================================== [employer] [facility] (MW) ==================================================================== Test                             Result       Flag       Units  Drug Present and Declared for Prescription Verification   Oxycodone                      491          EXPECTED   ng/mg creat   Oxymorphone                    228          EXPECTED   ng/mg creat   Noroxycodone                   1816         EXPECTED   ng/mg creat   Noroxymorphone                 96           EXPECTED   ng/mg creat    Sources of oxycodone are scheduled prescription medications.    Oxymorphone, noroxycodone, and noroxymorphone are expected    metabolites of oxycodone. Oxymorphone is also available as a    scheduled prescription medication.  ==================================================================== Test                      Result    Flag   Units      Ref Range   Creatinine              57               mg/dL      >=20 ==================================================================== Declared Medications:  The flagging and interpretation on this report are based on the  following declared medications.  Unexpected results may arise  from  inaccuracies in the declared medications.   **Note: The testing scope of this panel includes these medications:   Oxycodone (Roxicodone)   **Note: The testing scope of this panel does not include the  following reported medications:   Acetaminophen (Excedrin)  Alpha Lipoic Acid (Alpha Lipoic Acid)  Aspirin (Excedrin)  Atorvastatin (Lipitor)  Caffeine (Excedrin)  Dulaglutide (Trulicity)  Duloxetine (Cymbalta)  Empagliflozin (Jardiance)  Goserelin  Insulin (Basaglar)  Pregabalin (Lyrica)  Sertraline (Zoloft)  Tamoxifen (Nolvadex)  Trazodone (Desyrel)  Vitamin D2 (Drisdol) ==================================================================== For clinical consultation, please call [phone]. ====================================================================      ROS  Constitutional: Denies any fever or chills Gastrointestinal: No reported hemesis, hematochezia, vomiting, or acute GI distress Musculoskeletal: Denies any acute onset joint swelling, redness, loss of ROM, or weakness Neurological: No reported episodes of acute onset apraxia, aphasia, dysarthria, agnosia, amnesia, paralysis, loss of coordination, or loss of consciousness  Medication Review  Basaglar KwikPen, DULoxetine, Insulin Pen Needle, OneTouch Verio, SUMAtriptan, Vitamin D (Ergocalciferol), amitriptyline, aspirin-acetaminophen-caffeine, empagliflozin, famotidine, glucose blood, goserelin, metFORMIN, metoCLOPramide, oxyCODONE, rosuvastatin, and tamoxifen  History Review  Allergy: Carolyn Roy is allergic to aspirin. Drug: Carolyn Roy  reports no history of drug use. Alcohol:  reports no history of alcohol use. Tobacco:  reports that she has been smoking cigarettes. She has a 11.00 pack-year smoking history. She has quit using smokeless tobacco.  Her smokeless tobacco use included snuff. Social: Carolyn Roy  reports that she has been smoking cigarettes. She has a 11.00 pack-year smoking history. She has  quit using smokeless tobacco.  Her smokeless tobacco use included snuff. She reports that she does not drink alcohol and does not use drugs. Medical:  has a past medical history of Allergy, Breast cancer ([place]), Cancer ([place]) (06/15/2017), Depression, Diabetes mellitus without complication ([place]) (9628), Edema of left upper extremity, Endometriosis, Family history of breast cancer, Headache, Hyperlipidemia, Lymphedema of left arm, Ovarian mass, Personal history of chemotherapy, Personal history of radiation therapy, and Pneumonia. Surgical: Carolyn Roy  has a past surgical history that includes Oophorectomy; Tubal ligation; Portacath placement (Right, 07/14/2017); Axillary lymph node biopsy (Left, 07/14/2017); Breast lumpectomy (Left, 01/12/2018); Breast biopsy (Left); Partial mastectomy with needle localization (Left, 01/12/2018); Sentinel node biopsy (Left, 01/12/2018); and Colonoscopy with propofol (N/A, 12/27/2019). Family: family history includes Breast cancer in an other family member; Breast cancer (age of onset: 33) in her maternal grandmother; Breast cancer (age of onset: 24) in an other family member; Breast cancer (age of onset: 27) in her maternal aunt; Cancer in her mother; Colon cancer in her maternal grandmother and mother; Diabetes in her brother; Other in her father; Pancreatitis in her brother; Prostate cancer (age of onset: 72) in her brother.  Laboratory Chemistry Profile   Renal Lab Results  Component Value Date   BUN 10 05/25/2021   CREATININE 0.62 05/25/2021   BCR 17 05/14/2021   GFRAA >60 10/21/2019   GFRNONAA >60 05/25/2021    Hepatic Lab Results  Component Value Date   AST 18 05/25/2021   ALT 15 05/25/2021   ALBUMIN 3.6 05/25/2021   ALKPHOS 63 05/25/2021   LIPASE 31 12/01/2020    Electrolytes Lab Results  Component Value Date   NA 134 (L) 05/25/2021   K 3.8 05/25/2021   CL 101 05/25/2021   CALCIUM 8.8 (L) 05/25/2021   MG 2.1 02/12/2021    Bone Lab Results  Component  Value Date   VD25OH 45.4 11/11/2020    Inflammation (CRP: Acute Phase) (ESR: Chronic Phase) Lab Results  Component Value Date   CRP <1 12/21/2020   ESRSEDRATE 3 12/21/2020         Note: Above Lab results reviewed.  Recent Imaging Review  MR BRAIN W WO CONTRAST CLINICAL DATA:  56 year old female with a history of breast cancer and headaches, with nonspecific dural thickening and enhancement over the right frontoparietal convexity on November MRI.  EXAM: MRI HEAD WITHOUT AND WITH CONTRAST  TECHNIQUE: Multiplanar, multiecho pulse sequences of the brain and surrounding structures were obtained without and with intravenous contrast.  CONTRAST:  11m GADAVIST GADOBUTROL 1 MMOL/ML IV SOLN  COMPARISON:  01/06/2021 brain MRI.  FINDINGS: Brain: Cerebral volume remains normal. No restricted diffusion to suggest acute infarction. No midline shift, mass effect, evidence of mass lesion, ventriculomegaly, extra-axial collection or acute intracranial hemorrhage. Cervicomedullary junction and pituitary are within normal limits.  Vertex arachnoid granulations on series 17, image 12 (physiologic).  On postcontrast imaging today there is no convincing asymmetric or abnormal dural thickening or enhancement (compare series 19, image 14 today to the same series and image in November). No abnormal enhancement identified. No cerebral edema, and gray and white matter signal is stable and within normal limits. No chronic cerebral blood products identified on SWI.  Vascular: Major intracranial vascular flow voids are stable, dominant distal right vertebral artery. The major dural venous sinuses are enhancing and appear to be patent.  Skull and upper cervical spine: Bone marrow signal appears stable with no destructive or suspicious skull or osseous lesion identified. Negative visible cervical spine and spinal cord.  Sinuses/Orbits: Stable and negative.  Other: Mastoids remain clear. Visible  internal auditory structures appear normal. Negative visible scalp and face.  IMPRESSION: Resolved nonspecific right hemisphere dural thickening and enhancement seen in November.  Normal MRI appearance of the brain now. No metastatic disease or acute intracranial abnormality.  Electronically Signed   By: Genevie Ann M.D.   On: 05/20/2021 09:05 Note: Reviewed        Physical Exam  General appearance: Well nourished, well developed, and well hydrated. In no apparent acute distress Mental status: Alert, oriented x 3 (person, place, & time)       Respiratory: No evidence of acute respiratory distress Eyes: PERLA Vitals: BP 108/70   Pulse 92   Temp (!) 97.3 F (36.3 C) (Temporal)   Resp 14   Ht _0  (1.626 m)   Wt 164 lb (74.4 kg)   LMP  (LMP Unknown) Comment: LAST PERIOD IN MAY 2019 WHEN SHE STARTED CHEMO  SpO2 98%   BMI 28.15 kg/m  BMI: Estimated body mass index is 28.15 kg/m as calculated from the following:   Height as of this encounter: _1  (1.626 m).   Weight as of this encounter: 164 lb (74.4 kg). Ideal: Ideal body weight: 54.7 kg (120 lb 9.5 oz) Adjusted ideal body weight: 62.6 kg (137 lb 15.3 oz)  Assessment   Diagnosis Status  1. Chronic pain syndrome   2. Cancer-related pain   3. Chronic feet pain (1ry area of Pain) (Bilateral) (R>L)   4. Chronic hand pain (3ry area of Pain) (Bilateral) (R>L)   5. Chronic hip pain (4th area of Pain) (Bilateral) (R>L)   6. Chronic knee pain (2ry area of Pain) (Bilateral) (R>L)   7. Cervicalgia   8. Chronic low back pain (Bilateral (L>R) w/o sciatica   9. Abnormal MRI, lumbar spine (04/20/2018)   10. 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 Arthropathy of spinal facet joint concurrent with and due to effusion (L3-4, L4-5)   12. DDD (degenerative disc disease), lumbosacral   13. Lumbar facet syndrome (Bilateral) (L>R)   14. Pharmacologic therapy   15. Chronic use of opiate for therapeutic purpose   16.  Encounter for medication management   17. Encounter for chronic pain management    Controlled Controlled Controlled   Updated Problems: No problems updated.  Plan of Care  Problem-specific:  No problem-specific Assessment & Plan notes found for this encounter.  Ms. KARAN INCLAN has a current medication list which includes the following long-term medication(s): amitriptyline, famotidine, basaglar kwikpen, basaglar kwikpen, metformin, metoclopramide, [START ON 06/04/2021] oxycodone, [START ON 07/04/2021] oxycodone, [START ON 08/03/2021] oxycodone, rosuvastatin, and sumatriptan.  Pharmacotherapy (Medications Ordered): Meds ordered this encounter  Medications   oxyCODONE (OXY IR/ROXICODONE) 5 MG immediate release tablet    Sig: Take 1 tablet (5 mg  total) by mouth 2 (two) times daily as needed for severe pain. Must last 30 days    Dispense:  60 tablet    Refill:  0    DO NOT: delete (not duplicate); no partial-fill (will deny script to complete), no refill request (F/U required). DISPENSE: 1 day early if closed on fill date. WARN: No CNS-depressants within 8 hrs of med.   oxyCODONE (OXY IR/ROXICODONE) 5 MG immediate release tablet    Sig: Take 1 tablet (5 mg total) by mouth 2 (two) times daily as needed for severe pain. Must last 30 days    Dispense:  60 tablet    Refill:  0    DO NOT: delete (not duplicate); no partial-fill (will deny script to complete), no refill request (F/U required). DISPENSE: 1 day early if closed on fill date. WARN: No CNS-depressants within 8 hrs of med.   oxyCODONE (OXY IR/ROXICODONE) 5 MG immediate release tablet    Sig: Take 1 tablet (5 mg total) by mouth 2 (two) times daily as needed for severe pain. Must last 30 days    Dispense:  60 tablet    Refill:  0    DO NOT: delete (not duplicate); no partial-fill (will deny script to complete), no refill request (F/U required). DISPENSE: 1 day early if closed on fill date. WARN: No CNS-depressants within 8 hrs of  med.   Orders:  No orders of the defined types were placed in this encounter.  Follow-up plan:   Return in about 3 months (around 09/02/2021) for Eval-day (M,W), (F2F), (MM).     Interventional Therapies  Risk  Complexity Considerations:   Estimated body mass index is 30.55 kg/m as calculated from the following:   Height as of this encounter: _0  (1.626 m).   Weight as of this encounter: 178 lb (80.7 kg). WNL   Planned  Pending:      Under consideration:   Possible bilateral lumbar facet RFA  Diagnostic lumbar sympathetic Blk  Diagnostic right genicular NB  Possible right genicular nerve RFA    Completed:   Therapeutic right IA steroid knee inj. x1 (08/18/2020) (100/100/100 x 24 hours/80-85)  Diagnostic bilateral lumbar facet Blk x2 (08/16/18) (100/100/80/90-100)  Therapeutic/palliative right knee Hyalgan inj. x5 (06/25/2019) (100/100/75 x 1 week/70)    Therapeutic  Palliative (PRN) options:   Therapeutic right IA steroid knee inj.  Diagnostic bilateral lumbar facet MBB   Therapeutic/palliative right Hyalgan knee inj.      Recent Visits Date Type Provider Dept  03/03/21 Office Visit Milinda Pointer, MD [facility]  Showing recent visits within past 90 days and meeting all other requirements Today's Visits Date Type Provider Dept  05/31/21 Office Visit Milinda Pointer, MD [facility]  Showing today's visits and meeting all other requirements Future Appointments No visits were found meeting these conditions. Showing future appointments within next 90 days and meeting all other requirements  I discussed the assessment and treatment plan with the patient. The patient was provided an opportunity to ask questions and all were answered. The patient agreed with the plan and demonstrated an understanding of the instructions.  Patient advised to call back or seek an in-person evaluation if the symptoms or condition worsens.  Duration of encounter:  32 minutes.  Note by: Gaspar Cola, MD Date: 05/31/2021; Time: 2:13 PM

## 2021-05-31 ENCOUNTER — Ambulatory Visit: Payer: No Typology Code available for payment source | Attending: Pain Medicine | Admitting: Pain Medicine

## 2021-05-31 ENCOUNTER — Encounter: Payer: Self-pay | Admitting: Pain Medicine

## 2021-05-31 VITALS — BP 108/70 | HR 92 | Temp 97.3°F | Resp 14 | Ht 64.0 in | Wt 164.0 lb

## 2021-05-31 DIAGNOSIS — M542 Cervicalgia: Secondary | ICD-10-CM | POA: Diagnosis present

## 2021-05-31 DIAGNOSIS — M545 Low back pain, unspecified: Secondary | ICD-10-CM

## 2021-05-31 DIAGNOSIS — M79642 Pain in left hand: Secondary | ICD-10-CM | POA: Diagnosis present

## 2021-05-31 DIAGNOSIS — M25551 Pain in right hip: Secondary | ICD-10-CM

## 2021-05-31 DIAGNOSIS — M25552 Pain in left hip: Secondary | ICD-10-CM | POA: Diagnosis present

## 2021-05-31 DIAGNOSIS — M47816 Spondylosis without myelopathy or radiculopathy, lumbar region: Secondary | ICD-10-CM | POA: Insufficient documentation

## 2021-05-31 DIAGNOSIS — M79672 Pain in left foot: Secondary | ICD-10-CM | POA: Diagnosis present

## 2021-05-31 DIAGNOSIS — M25562 Pain in left knee: Secondary | ICD-10-CM

## 2021-05-31 DIAGNOSIS — M5137 Other intervertebral disc degeneration, lumbosacral region: Secondary | ICD-10-CM | POA: Insufficient documentation

## 2021-05-31 DIAGNOSIS — G8929 Other chronic pain: Secondary | ICD-10-CM

## 2021-05-31 DIAGNOSIS — R937 Abnormal findings on diagnostic imaging of other parts of musculoskeletal system: Secondary | ICD-10-CM | POA: Diagnosis present

## 2021-05-31 DIAGNOSIS — Z79899 Other long term (current) drug therapy: Secondary | ICD-10-CM

## 2021-05-31 DIAGNOSIS — M79671 Pain in right foot: Secondary | ICD-10-CM | POA: Diagnosis not present

## 2021-05-31 DIAGNOSIS — M79641 Pain in right hand: Secondary | ICD-10-CM | POA: Insufficient documentation

## 2021-05-31 DIAGNOSIS — G893 Neoplasm related pain (acute) (chronic): Secondary | ICD-10-CM | POA: Diagnosis not present

## 2021-05-31 DIAGNOSIS — Z79891 Long term (current) use of opiate analgesic: Secondary | ICD-10-CM | POA: Insufficient documentation

## 2021-05-31 DIAGNOSIS — M47819 Spondylosis without myelopathy or radiculopathy, site unspecified: Secondary | ICD-10-CM | POA: Insufficient documentation

## 2021-05-31 DIAGNOSIS — G894 Chronic pain syndrome: Secondary | ICD-10-CM | POA: Insufficient documentation

## 2021-05-31 DIAGNOSIS — M2540 Effusion, unspecified joint: Secondary | ICD-10-CM

## 2021-05-31 DIAGNOSIS — M4316 Spondylolisthesis, lumbar region: Secondary | ICD-10-CM | POA: Insufficient documentation

## 2021-05-31 DIAGNOSIS — M25561 Pain in right knee: Secondary | ICD-10-CM | POA: Diagnosis present

## 2021-05-31 MED ORDER — OXYCODONE HCL 5 MG PO TABS: 5.0000 mg | ORAL_TABLET | Freq: Two times a day (BID) | ORAL | 0 refills | Status: DC | PRN

## 2021-05-31 NOTE — Patient Instructions (Signed)
____________________________________________________________________________________________  Medication Rules  Purpose: To inform patients, and their family members, of our rules and regulations.  Applies to: All patients receiving prescriptions (written or electronic).  Pharmacy of record: Pharmacy where electronic prescriptions will be sent. If written prescriptions are taken to a different pharmacy, please inform the nursing staff. The pharmacy listed in the electronic medical record should be the one where you would like electronic prescriptions to be sent.  Electronic prescriptions: In compliance with the [place] Strengthen Opioid Misuse Prevention (STOP) Act of 2017 (Session Law 2017-74/H243), effective February 07, 2018, all controlled substances must be electronically prescribed. Calling prescriptions to the pharmacy will cease to exist.  Prescription refills: Only during scheduled appointments. Applies to all prescriptions.  NOTE: The following applies primarily to controlled substances (Opioid* Pain Medications).   Type of encounter (visit): For patients receiving controlled substances, face-to-face visits are required. (Not an option or up to the patient.)  Patient's responsibilities: Pain Pills: Bring all pain pills to every appointment (except for procedure appointments). Pill Bottles: Bring pills in original pharmacy bottle. Always bring the newest bottle. Bring bottle, even if empty. Medication refills: You are responsible for knowing and keeping track of what medications you take and those you need refilled. The day before your appointment: write a list of all prescriptions that need to be refilled. The day of the appointment: give the list to the admitting nurse. Prescriptions will be written only during appointments. No prescriptions will be written on procedure days. If you forget a medication: it will not be "Called in", "Faxed", or "electronically sent". You will  need to get another appointment to get these prescribed. No early refills. Do not call asking to have your prescription filled early. Prescription Accuracy: You are responsible for carefully inspecting your prescriptions before leaving our office. Have the discharge nurse carefully go over each prescription with you, before taking them home. Make sure that your name is accurately spelled, that your address is correct. Check the name and dose of your medication to make sure it is accurate. Check the number of pills, and the written instructions to make sure they are clear and accurate. Make sure that you are given enough medication to last until your next medication refill appointment. Taking Medication: Take medication as prescribed. When it comes to controlled substances, taking less pills or less frequently than prescribed is permitted and encouraged. Never take more pills than instructed. Never take medication more frequently than prescribed.  Inform other Doctors: Always inform, all of your healthcare providers, of all the medications you take. Pain Medication from other Providers: You are not allowed to accept any additional pain medication from any other Doctor or Healthcare provider. There are two exceptions to this rule. (see below) In the event that you require additional pain medication, you are responsible for notifying us, as stated below. Cough Medicine: Often these contain an opioid, such as codeine or hydrocodone. Never accept or take cough medicine containing these opioids if you are already taking an opioid* medication. The combination may cause respiratory failure and death. Medication Agreement: You are responsible for carefully reading and following our Medication Agreement. This must be signed before receiving any prescriptions from our practice. Safely store a copy of your signed Agreement. Violations to the Agreement will result in no further prescriptions. (Additional copies of our  Medication Agreement are available upon request.) Laws, Rules, & Regulations: All patients are expected to follow all Federal and State Laws, Statutes, Rules, & Regulations. Ignorance of   the Laws does not constitute a valid excuse.  Illegal drugs and Controlled Substances: The use of illegal substances (including, but not limited to marijuana and its derivatives) and/or the illegal use of any controlled substances is strictly prohibited. Violation of this rule may result in the immediate and permanent discontinuation of any and all prescriptions being written by our practice. The use of any illegal substances is prohibited. Adopted CDC guidelines & recommendations: Target dosing levels will be at or below 60 MME/day. Use of benzodiazepines** is not recommended.  Exceptions: There are only two exceptions to the rule of not receiving pain medications from other Healthcare Providers. Exception #1 (Emergencies): In the event of an emergency (i.e.: accident requiring emergency care), you are allowed to receive additional pain medication. However, you are responsible for: As soon as you are able, call our office [phone], at any time of the day or night, and leave a message stating your name, the date and nature of the emergency, and the name and dose of the medication prescribed. In the event that your call is answered by a member of our staff, make sure to document and save the date, time, and the name of the person that took your information.  Exception #2 (Planned Surgery): In the event that you are scheduled by another doctor or dentist to have any type of surgery or procedure, you are allowed (for a period no longer than 30 days), to receive additional pain medication, for the acute post-op pain. However, in this case, you are responsible for picking up a copy of our "Post-op Pain Management for Surgeons" handout, and giving it to your surgeon or dentist. This document is available at our office, and  does not require an appointment to obtain it. Simply go to our office during business hours (Monday-Thursday from 8:00 AM to 4:00 PM) (Friday 8:00 AM to 12:00 Noon) or if you have a scheduled appointment with us, prior to your surgery, and ask for it by name. In addition, you are responsible for: calling our office [phone], at any time of the day or night, and leaving a message stating your name, name of your surgeon, type of surgery, and date of procedure or surgery. Failure to comply with your responsibilities may result in termination of therapy involving the controlled substances. Medication Agreement Violation. Following the above rules, including your responsibilities will help you in avoiding a Medication Agreement Violation ("Breaking your Pain Medication Contract").  *Opioid medications include: morphine, codeine, oxycodone, oxymorphone, hydrocodone, hydromorphone, meperidine, tramadol, tapentadol, buprenorphine, fentanyl, methadone. **Benzodiazepine medications include: diazepam (Valium), alprazolam (Xanax), clonazepam (Klonopine), lorazepam (Ativan), clorazepate (Tranxene), chlordiazepoxide (Librium), estazolam (Prosom), oxazepam (Serax), temazepam (Restoril), triazolam (Halcion) (Last updated: 11/04/2020) ____________________________________________________________________________________________  ____________________________________________________________________________________________  Medication Recommendations and Reminders  Applies to: All patients receiving prescriptions (written and/or electronic).  Medication Rules & Regulations: These rules and regulations exist for your safety and that of others. They are not flexible and neither are we. Dismissing or ignoring them will be considered "non-compliance" with medication therapy, resulting in complete and irreversible termination of such therapy. (See document titled "Medication Rules" for more details.) In all conscience,  because of safety reasons, we cannot continue providing a therapy where the patient does not follow instructions.  Pharmacy of record:  Definition: This is the pharmacy where your electronic prescriptions will be sent.  We do not endorse any particular pharmacy, however, we have experienced problems with [facility] not securing enough medication supply for the community. We do not restrict you   in your choice of pharmacy. However, once we write for your prescriptions, we will NOT be re-sending more prescriptions to fix restricted supply problems created by your pharmacy, or your insurance.  The pharmacy listed in the electronic medical record should be the one where you want electronic prescriptions to be sent. If you choose to change pharmacy, simply notify our nursing staff.  Recommendations: Keep all of your pain medications in a safe place, under lock and key, even if you live alone. We will NOT replace lost, stolen, or damaged medication. After you fill your prescription, take 1 week's worth of pills and put them away in a safe place. You should keep a separate, properly labeled bottle for this purpose. The remainder should be kept in the original bottle. Use this as your primary supply, until it runs out. Once it's gone, then you know that you have 1 week's worth of medicine, and it is time to come in for a prescription refill. If you do this correctly, it is unlikely that you will ever run out of medicine. To make sure that the above recommendation works, it is very important that you make sure your medication refill appointments are scheduled at least 1 week before you run out of medicine. To do this in an effective manner, make sure that you do not leave the office without scheduling your next medication management appointment. Always ask the nursing staff to show you in your prescription , when your medication will be running out. Then arrange for the receptionist to get you a return appointment,  at least 7 days before you run out of medicine. Do not wait until you have 1 or 2 pills left, to come in. This is very poor planning and does not take into consideration that we may need to cancel appointments due to bad weather, sickness, or emergencies affecting our staff. DO NOT ACCEPT A "Partial Fill": If for any reason your pharmacy does not have enough pills/tablets to completely fill or refill your prescription, do not allow for a "partial fill". The law allows the pharmacy to complete that prescription within 72 hours, without requiring a new prescription. If they do not fill the rest of your prescription within those 72 hours, you will need a separate prescription to fill the remaining amount, which we will NOT provide. If the reason for the partial fill is your insurance, you will need to talk to the pharmacist about payment alternatives for the remaining tablets, but again, DO NOT ACCEPT A PARTIAL FILL, unless you can trust your pharmacist to obtain the remainder of the pills within 72 hours.  Prescription refills and/or changes in medication(s):  Prescription refills, and/or changes in dose or medication, will be conducted only during scheduled medication management appointments. (Applies to both, written and electronic prescriptions.) No refills on procedure days. No medication will be changed or started on procedure days. No changes, adjustments, and/or refills will be conducted on a procedure day. Doing so will interfere with the diagnostic portion of the procedure. No phone refills. No medications will be "called into the pharmacy". No Fax refills. No weekend refills. No Holliday refills. No after hours refills.  Remember:  Business hours are:  Monday to Thursday 8:00 AM to 4:00 PM Provider's Schedule: Gianmarco Roye, MD - Appointments are:  Medication management: Monday and Wednesday 8:00 AM to 4:00 PM Procedure day: Tuesday and Thursday 7:30 AM to 4:00 PM Bilal Lateef, MD -  Appointments are:  Medication management: Tuesday and Thursday 8:00   AM to 4:00 PM Procedure day: Monday and Wednesday 7:30 AM to 4:00 PM (Last update: 08/28/2019) ____________________________________________________________________________________________  ____________________________________________________________________________________________  CBD (cannabidiol) & Delta-8 (Delta-8 tetrahydrocannabinol) WARNING  Intro: Cannabidiol (CBD) and tetrahydrocannabinol (THC), are two natural compounds found in plants of the Cannabis genus. They can both be extracted from hemp or cannabis. Hemp and cannabis come from the Cannabis sativa plant. Both compounds interact with your body's endocannabinoid system, but they have very different effects. CBD does not produce the high sensation associated with cannabis. Delta-8 tetrahydrocannabinol, also known as delta-8 THC, is a psychoactive substance found in the Cannabis sativa plant, of which marijuana and hemp are two varieties. THC is responsible for the high associated with the illicit use of marijuana.  Applicable to: All individuals currently taking or considering taking CBD (cannabidiol) and, more important, all patients taking opioid analgesic controlled substances (pain medication). (Example: oxycodone; oxymorphone; hydrocodone; hydromorphone; morphine; methadone; tramadol; tapentadol; fentanyl; buprenorphine; butorphanol; dextromethorphan; meperidine; codeine; etc.)  Legal status: CBD remains a Schedule I drug prohibited for any use. CBD is illegal with one exception. In the [place], CBD has a limited Food and Drug Administration (FDA) approval for the treatment of two specific types of epilepsy disorders. Only one CBD product has been approved by the FDA for this purpose: "Epidiolex". FDA is aware that some companies are marketing products containing cannabis and cannabis-derived compounds in ways that violate the Federal Food, Drug and Cosmetic Act  (FD&C Act) and that may put the health and safety of consumers at risk. The FDA, a Federal agency, has not enforced the CBD status since 2018. UPDATE: (03/26/2021) The Drug Enforcement Agency (DEA) issued a letter stating that "delta" cannabinoids, including Delta-8-THCO and Delta-9-THCO, synthetically derived from hemp do not qualify as hemp and will be viewed as Schedule I drugs. (Schedule I drugs, substances, or chemicals are defined as drugs with no currently accepted medical use and a high potential for abuse. Some examples of Schedule I drugs are: heroin, lysergic acid diethylamide (LSD), marijuana (cannabis), 3,4-methylenedioxymethamphetamine (ecstasy), methaqualone, and peyote.) (https://www.dea.gov)  Legality: Some manufacturers ship CBD products nationally, which is illegal. Often such products are sold online and are therefore available throughout the country. CBD is openly sold in head shops and health food stores in some states where such sales have not been explicitly legalized. Selling unapproved products with unsubstantiated therapeutic claims is not only a violation of the law, but also can put patients at risk, as these products have not been proven to be safe or effective. Federal illegality makes it difficult to conduct research on CBD.  Reference: "FDA Regulation of Cannabis and Cannabis-Derived Products, Including Cannabidiol (CBD)" - https://www.fda.gov/news-events/public-health-focus/fda-regulation-cannabis-and-cannabis-derived-products-including-cannabidiol-cbd  Warning: CBD is not FDA approved and has not undergo the same manufacturing controls as prescription drugs.  This means that the purity and safety of available CBD may be questionable. Most of the time, despite manufacturer's claims, it is contaminated with THC (delta-9-tetrahydrocannabinol - the chemical in marijuana responsible for the "HIGH").  When this is the case, the THC contaminant will trigger a positive urine drug  screen (UDS) test for Marijuana (carboxy-THC). Because a positive UDS for any illicit substance is a violation of our medication agreement, your opioid analgesics (pain medicine) may be permanently discontinued. The FDA recently put out a warning about 5 things that everyone should be aware of regarding Delta-8 THC: Delta-8 THC products have not been evaluated or approved by the FDA for safe use and may be marketed in ways that put the   public health at risk. The FDA has received adverse event reports involving delta-8 THC-containing products. Delta-8 THC has psychoactive and intoxicating effects. Delta-8 THC manufacturing often involve use of potentially harmful chemicals to create the concentrations of delta-8 THC claimed in the marketplace. The final delta-8 THC product may have potentially harmful by-products (contaminants) due to the chemicals used in the process. Manufacturing of delta-8 THC products may occur in uncontrolled or unsanitary settings, which may lead to the presence of unsafe contaminants or other potentially harmful substances. Delta-8 THC products should be kept out of the reach of children and pets.  MORE ABOUT CBD  General Information: CBD was discovered in 1940 and it is a derivative of the cannabis sativa genus plants (Marijuana and Hemp). It is one of the 113 identified substances found in Marijuana. It accounts for up to 40% of the plant's extract. As of 2018, preliminary clinical studies on CBD included research for the treatment of anxiety, movement disorders, and pain. CBD is available and consumed in multiple forms, including inhalation of smoke or vapor, as an aerosol spray, and by mouth. It may be supplied as an oil containing CBD, capsules, dried cannabis, or as a liquid solution. CBD is thought not to be as psychoactive as THC (delta-9-tetrahydrocannabinol - the chemical in marijuana responsible for the "HIGH"). Studies suggest that CBD may interact with different  biological target receptors in the body, including cannabinoid and other neurotransmitter receptors. As of 2018 the mechanism of action for its biological effects has not been determined.  Side-effects  Adverse reactions: Dry mouth, diarrhea, decreased appetite, fatigue, drowsiness, malaise, weakness, sleep disturbances, and others.  Drug interactions: CBC may interact with other medications such as blood-thinners. Because CBD causes drowsiness on its own, it also increases the drowsiness caused by other medications, including antihistamines (such as Benadryl), benzodiazepines (Xanax, Ativan, Valium), antipsychotics, antidepressants and opioids, as well as alcohol and supplements such as kava, melatonin and St. John's Wort. Be cautious with the following combinations:   Brivaracetam (Briviact) Brivaracetam is changed and broken down by the body. CBD might decrease how quickly the body breaks down brivaracetam. This might increase levels of brivaracetam in the body.  Caffeine Caffeine is changed and broken down by the body. CBD might decrease how quickly the body breaks down caffeine. This might increase levels of caffeine in the body.  Carbamazepine (Tegretol) Carbamazepine is changed and broken down by the body. CBD might decrease how quickly the body breaks down carbamazepine. This might increase levels of carbamazepine in the body and increase its side effects.  Citalopram (Celexa) Citalopram is changed and broken down by the body. CBD might decrease how quickly the body breaks down citalopram. This might increase levels of citalopram in the body and increase its side effects.  Clobazam (Onfi) Clobazam is changed and broken down by the liver. CBD might decrease how quickly the liver breaks down clobazam. This might increase the effects and side effects of clobazam.  Eslicarbazepine (Aptiom) Eslicarbazepine is changed and broken down by the body. CBD might decrease how quickly the body  breaks down eslicarbazepine. This might increase levels of eslicarbazepine in the body by a small amount.  Everolimus (Zostress) Everolimus is changed and broken down by the body. CBD might decrease how quickly the body breaks down everolimus. This might increase levels of everolimus in the body.  Lithium Taking higher doses of CBD might increase levels of lithium. This can increase the risk of lithium toxicity.  Medications changed by the liver (  Cytochrome P450 1A1 (CYP1A1) substrates) Some medications are changed and broken down by the liver. CBD might change how quickly the liver breaks down these medications. This could change the effects and side effects of these medications.  Medications changed by the liver (Cytochrome P450 1A2 (CYP1A2) substrates) Some medications are changed and broken down by the liver. CBD might change how quickly the liver breaks down these medications. This could change the effects and side effects of these medications.  Medications changed by the liver (Cytochrome P450 1B1 (CYP1B1) substrates) Some medications are changed and broken down by the liver. CBD might change how quickly the liver breaks down these medications. This could change the effects and side effects of these medications.  Medications changed by the liver (Cytochrome P450 2A6 (CYP2A6) substrates) Some medications are changed and broken down by the liver. CBD might change how quickly the liver breaks down these medications. This could change the effects and side effects of these medications.  Medications changed by the liver (Cytochrome P450 2B6 (CYP2B6) substrates) Some medications are changed and broken down by the liver. CBD might change how quickly the liver breaks down these medications. This could change the effects and side effects of these medications.  Medications changed by the liver (Cytochrome P450 2C19 (CYP2C19) substrates) Some medications are changed and broken down by the liver.  CBD might change how quickly the liver breaks down these medications. This could change the effects and side effects of these medications.  Medications changed by the liver (Cytochrome P450 2C8 (CYP2C8) substrates) Some medications are changed and broken down by the liver. CBD might change how quickly the liver breaks down these medications. This could change the effects and side effects of these medications.  Medications changed by the liver (Cytochrome P450 2C9 (CYP2C9) substrates) Some medications are changed and broken down by the liver. CBD might change how quickly the liver breaks down these medications. This could change the effects and side effects of these medications.  Medications changed by the liver (Cytochrome P450 2D6 (CYP2D6) substrates) Some medications are changed and broken down by the liver. CBD might change how quickly the liver breaks down these medications. This could change the effects and side effects of these medications.  Medications changed by the liver (Cytochrome P450 2E1 (CYP2E1) substrates) Some medications are changed and broken down by the liver. CBD might change how quickly the liver breaks down these medications. This could change the effects and side effects of these medications.  Medications changed by the liver (Cytochrome P450 3A4 (CYP3A4) substrates) Some medications are changed and broken down by the liver. CBD might change how quickly the liver breaks down these medications. This could change the effects and side effects of these medications.  Medications changed by the liver (Glucuronidated drugs) Some medications are changed and broken down by the liver. CBD might change how quickly the liver breaks down these medications. This could change the effects and side effects of these medications.  Medications that decrease the breakdown of other medications by the liver (Cytochrome P450 2C19 (CYP2C19) inhibitors) CBD is changed and broken down by the liver.  Some drugs decrease how quickly the liver changes and breaks down CBD. This could change the effects and side effects of CBD.  Medications that decrease the breakdown of other medications in the liver (Cytochrome P450 3A4 (CYP3A4) inhibitors) CBD is changed and broken down by the liver. Some drugs decrease how quickly the liver changes and breaks down CBD. This could change the effects   and side effects of CBD.  Medications that increase breakdown of other medications by the liver (Cytochrome P450 3A4 (CYP3A4) inducers) CBD is changed and broken down by the liver. Some drugs increase how quickly the liver changes and breaks down CBD. This could change the effects and side effects of CBD.  Medications that increase the breakdown of other medications by the liver (Cytochrome P450 2C19 (CYP2C19) inducers) CBD is changed and broken down by the liver. Some drugs increase how quickly the liver changes and breaks down CBD. This could change the effects and side effects of CBD.  Methadone (Dolophine) Methadone is broken down by the liver. CBD might decrease how quickly the liver breaks down methadone. Taking cannabidiol along with methadone might increase the effects and side effects of methadone.  Rufinamide (Banzel) Rufinamide is changed and broken down by the body. CBD might decrease how quickly the body breaks down rufinamide. This might increase levels of rufinamide in the body by a small amount.  Sedative medications (CNS depressants) CBD might cause sleepiness and slowed breathing. Some medications, called sedatives, can also cause sleepiness and slowed breathing. Taking CBD with sedative medications might cause breathing problems and/or too much sleepiness.  Sirolimus (Rapamune) Sirolimus is changed and broken down by the body. CBD might decrease how quickly the body breaks down sirolimus. This might increase levels of sirolimus in the body.  Stiripentol (Diacomit) Stiripentol is changed and  broken down by the body. CBD might decrease how quickly the body breaks down stiripentol. This might increase levels of stiripentol in the body and increase its side effects.  Tacrolimus (Prograf) Tacrolimus is changed and broken down by the body. CBD might decrease how quickly the body breaks down tacrolimus. This might increase levels of tacrolimus in the body.  Tamoxifen (Soltamox) Tamoxifen is changed and broken down by the body. CBD might affect how quickly the body breaks down tamoxifen. This might affect levels of tamoxifen in the body.  Topiramate (Topamax) Topiramate is changed and broken down by the body. CBD might decrease how quickly the body breaks down topiramate. This might increase levels of topiramate in the body by a small amount.  Valproate Valproic acid can cause liver injury. Taking cannabidiol with valproic acid might increase the chance of liver injury. CBD and/or valproic acid might need to be stopped, or the dose might need to be reduced.  Warfarin (Coumadin) CBD might increase levels of warfarin, which can increase the risk for bleeding. CBD and/or warfarin might need to be stopped, or the dose might need to be reduced.  Zonisamide Zonisamide is changed and broken down by the body. CBD might decrease how quickly the body breaks down zonisamide. This might increase levels of zonisamide in the body by a small amount. (Last update: 04/07/2021) ____________________________________________________________________________________________  ____________________________________________________________________________________________  Drug Holidays (Slow)  What is a "Drug Holiday"? Drug Holiday: is the name given to the period of time during which a patient stops taking a medication(s) for the purpose of eliminating tolerance to the drug.  Benefits Improved effectiveness of opioids. Decreased opioid dose needed to achieve benefits. Improved pain with lesser  dose.  What is tolerance? Tolerance: is the progressive decreased in effectiveness of a drug due to its repetitive use. With repetitive use, the body gets use to the medication and as a consequence, it loses its effectiveness. This is a common problem seen with opioid pain medications. As a result, a larger dose of the drug is needed to achieve the same effect that   used to be obtained with a smaller dose.  How long should a "Drug Holiday" last? You should stay off of the pain medicine for at least 14 consecutive days. (2 weeks)  Should I stop the medicine "cold turkey"? No. You should always coordinate with your Pain Specialist so that he/she can provide you with the correct medication dose to make the transition as smoothly as possible.  How do I stop the medicine? Slowly. You will be instructed to decrease the daily amount of pills that you take by one (1) pill every seven (7) days. This is called a "slow downward taper" of your dose. For example: if you normally take four (4) pills per day, you will be asked to drop this dose to three (3) pills per day for seven (7) days, then to two (2) pills per day for seven (7) days, then to one (1) per day for seven (7) days, and at the end of those last seven (7) days, this is when the "Drug Holiday" would start.   Will I have withdrawals? By doing a "slow downward taper" like this one, it is unlikely that you will experience any significant withdrawal symptoms. Typically, what triggers withdrawals is the sudden stop of a high dose opioid therapy. Withdrawals can usually be avoided by slowly decreasing the dose over a prolonged period of time. If you do not follow these instructions and decide to stop your medication abruptly, withdrawals may be possible.  What are withdrawals? Withdrawals: refers to the wide range of symptoms that occur after stopping or dramatically reducing opiate drugs after heavy and prolonged use. Withdrawal symptoms do not occur to  patients that use low dose opioids, or those who take the medication sporadically. Contrary to benzodiazepine (example: Valium, Xanax, etc.) or alcohol withdrawals ("Delirium Tremens"), opioid withdrawals are not lethal. Withdrawals are the physical manifestation of the body getting rid of the excess receptors.  Expected Symptoms Early symptoms of withdrawal may include: Agitation Anxiety Muscle aches Increased tearing Insomnia Runny nose Sweating Yawning  Late symptoms of withdrawal may include: Abdominal cramping Diarrhea Dilated pupils Goose bumps Nausea Vomiting  Will I experience withdrawals? Due to the slow nature of the taper, it is very unlikely that you will experience any.  What is a slow taper? Taper: refers to the gradual decrease in dose.  (Last update: 08/28/2019) ____________________________________________________________________________________________    

## 2021-05-31 NOTE — Progress Notes (Signed)
Nursing Pain Medication Assessment:  ?Safety precautions to be maintained throughout the outpatient stay will include: orient to surroundings, keep bed in low position, maintain call bell within reach at all times, provide assistance with transfer out of bed and ambulation.  ?Medication Inspection Compliance: Pill count conducted under aseptic conditions, in front of the patient. Neither the pills nor the bottle was removed from the patient's sight at any time. Once count was completed pills were immediately returned to the patient in their original bottle. ? ?Medication: Oxycodone IR ?Pill/Patch Count:  24 of 60 pills remain ?Pill/Patch Appearance: Markings consistent with prescribed medication ?Bottle Appearance: Standard pharmacy container. Clearly labeled. ?Filled Date: 04 / 05 / 2023 ?Last Medication intake:  Today ?

## 2021-06-01 ENCOUNTER — Other Ambulatory Visit: Payer: Self-pay | Admitting: Internal Medicine

## 2021-06-04 ENCOUNTER — Encounter: Payer: Self-pay | Admitting: Internal Medicine

## 2021-06-06 NOTE — Patient Instructions (Incomplete)

## 2021-06-11 ENCOUNTER — Ambulatory Visit: Payer: No Typology Code available for payment source | Admitting: Nurse Practitioner

## 2021-06-13 NOTE — Patient Instructions (Signed)

## 2021-06-18 ENCOUNTER — Encounter (INDEPENDENT_AMBULATORY_CARE_PROVIDER_SITE_OTHER): Payer: No Typology Code available for payment source | Admitting: Nurse Practitioner

## 2021-06-18 NOTE — Progress Notes (Signed)
Error, not seen, patient had not started insulin ?

## 2021-07-14 ENCOUNTER — Other Ambulatory Visit: Payer: Self-pay

## 2021-07-14 ENCOUNTER — Telehealth: Payer: Self-pay | Admitting: Pain Medicine

## 2021-07-14 DIAGNOSIS — M79641 Pain in right hand: Secondary | ICD-10-CM

## 2021-07-14 DIAGNOSIS — R937 Abnormal findings on diagnostic imaging of other parts of musculoskeletal system: Secondary | ICD-10-CM

## 2021-07-14 DIAGNOSIS — Z79891 Long term (current) use of opiate analgesic: Secondary | ICD-10-CM

## 2021-07-14 DIAGNOSIS — M4316 Spondylolisthesis, lumbar region: Secondary | ICD-10-CM

## 2021-07-14 DIAGNOSIS — M5137 Other intervertebral disc degeneration, lumbosacral region: Secondary | ICD-10-CM

## 2021-07-14 DIAGNOSIS — M47819 Spondylosis without myelopathy or radiculopathy, site unspecified: Secondary | ICD-10-CM

## 2021-07-14 DIAGNOSIS — Z79899 Other long term (current) drug therapy: Secondary | ICD-10-CM

## 2021-07-14 DIAGNOSIS — G893 Neoplasm related pain (acute) (chronic): Secondary | ICD-10-CM

## 2021-07-14 DIAGNOSIS — G8929 Other chronic pain: Secondary | ICD-10-CM

## 2021-07-14 DIAGNOSIS — G894 Chronic pain syndrome: Secondary | ICD-10-CM

## 2021-07-14 DIAGNOSIS — M542 Cervicalgia: Secondary | ICD-10-CM

## 2021-07-14 DIAGNOSIS — M47816 Spondylosis without myelopathy or radiculopathy, lumbar region: Secondary | ICD-10-CM

## 2021-07-14 NOTE — Telephone Encounter (Signed)
Patient is calling regarding her pain meds, [facility] still does not have them. She is due to fill today. Please ask Dr. Dossie Arbour cancel scripts at [facility] and send to [facility].   Transfer all her pain meds to [facility] from now on.

## 2021-07-14 NOTE — Telephone Encounter (Signed)
Refill request sent to Dr Naveira  

## 2021-07-15 MED ORDER — OXYCODONE HCL 5 MG PO TABS: 5.0000 mg | ORAL_TABLET | Freq: Two times a day (BID) | ORAL | 0 refills | Status: DC | PRN

## 2021-07-15 NOTE — Telephone Encounter (Signed)
She wants to know if the script was sent to [facility]? She called yesterday to let someone know [facility] was on backorder and [facility] had the meds. Please look into this she states she is out of medicine.

## 2021-07-19 ENCOUNTER — Other Ambulatory Visit: Payer: Self-pay

## 2021-07-19 DIAGNOSIS — Z1231 Encounter for screening mammogram for malignant neoplasm of breast: Secondary | ICD-10-CM

## 2021-07-24 NOTE — Patient Instructions (Incomplete)
If morning blood sugar is 130 or greater then may increased Basaglar by 3 units every 3 days based on this.  Alert provider if any sugars greater then 300 or less then 70.  Diabetes Mellitus and Nutrition, Adult When you have diabetes, or diabetes mellitus, it is very important to have healthy eating habits because your blood sugar (glucose) levels are greatly affected by what you eat and drink. Eating healthy foods in the right amounts, at about the same times every day, can help you: Manage your blood glucose. Lower your risk of heart disease. Improve your blood pressure. Reach or maintain a healthy weight. What can affect my meal plan? Every person with diabetes is different, and each person has different needs for a meal plan. Your health care provider may recommend that you work with a dietitian to make a meal plan that is best for you. Your meal plan may vary depending on factors such as: The calories you need. The medicines you take. Your weight. Your blood glucose, blood pressure, and cholesterol levels. Your activity level. Other health conditions you have, such as heart or kidney disease. How do carbohydrates affect me? Carbohydrates, also called carbs, affect your blood glucose level more than any other type of food. Eating carbs raises the amount of glucose in your blood. It is important to know how many carbs you can safely have in each meal. This is different for every person. Your dietitian can help you calculate how many carbs you should have at each meal and for each snack. How does alcohol affect me? Alcohol can cause a decrease in blood glucose (hypoglycemia), especially if you use insulin or take certain diabetes medicines by mouth. Hypoglycemia can be a life-threatening condition. Symptoms of hypoglycemia, such as sleepiness, dizziness, and confusion, are similar to symptoms of having too much alcohol. Do not drink alcohol if: Your health care provider tells you not to  drink. You are pregnant, may be pregnant, or are planning to become pregnant. If you drink alcohol: Limit how much you have to: 0-1 drink a day for women. 0-2 drinks a day for men. Know how much alcohol is in your drink. In the [place] one drink equals one 12 oz bottle of beer (355 mL), one 5 oz glass of wine (148 mL), or one 1 oz glass of hard liquor (44 mL). Keep yourself hydrated with water, diet soda, or unsweetened iced tea. Keep in mind that regular soda, juice, and other mixers may contain a lot of sugar and must be counted as carbs. What are tips for following this plan?  Reading food labels Start by checking the serving size on the Nutrition Facts label of packaged foods and drinks. The number of calories and the amount of carbs, fats, and other nutrients listed on the label are based on one serving of the item. Many items contain more than one serving per package. Check the total grams (g) of carbs in one serving. Check the number of grams of saturated fats and trans fats in one serving. Choose foods that have a low amount or none of these fats. Check the number of milligrams (mg) of salt (sodium) in one serving. Most people should limit total sodium intake to less than 2,300 mg per day. Always check the nutrition information of foods labeled as "low-fat" or "nonfat." These foods may be higher in added sugar or refined carbs and should be avoided. Talk to your dietitian to identify your daily goals for nutrients listed on  the label. Shopping Avoid buying canned, pre-made, or processed foods. These foods tend to be high in fat, sodium, and added sugar. Shop around the outside edge of the grocery store. This is where you will most often find fresh fruits and vegetables, bulk grains, fresh meats, and fresh dairy products. Cooking Use low-heat cooking methods, such as baking, instead of high-heat cooking methods, such as deep frying. Cook using healthy oils, such as olive, canola, or  sunflower oil. Avoid cooking with butter, cream, or high-fat meats. Meal planning Eat meals and snacks regularly, preferably at the same times every day. Avoid going long periods of time without eating. Eat foods that are high in fiber, such as fresh fruits, vegetables, beans, and whole grains. Eat 4-6 oz (112-168 g) of lean protein each day, such as lean meat, chicken, fish, eggs, or tofu. One ounce (oz) (28 g) of lean protein is equal to: 1 oz (28 g) of meat, chicken, or fish. 1 egg.  cup (62 g) of tofu. Eat some foods each day that contain healthy fats, such as avocado, nuts, seeds, and fish. What foods should I eat? Fruits Berries. Apples. Oranges. Peaches. Apricots. Plums. Grapes. Mangoes. Papayas. Pomegranates. Kiwi. Cherries. Vegetables Leafy greens, including lettuce, spinach, kale, chard, collard greens, mustard greens, and cabbage. Beets. Cauliflower. Broccoli. Carrots. Green beans. Tomatoes. Peppers. Onions. Cucumbers. Brussels sprouts. Grains Whole grains, such as whole-wheat or whole-grain bread, crackers, tortillas, cereal, and pasta. Unsweetened oatmeal. Quinoa. Brown or wild rice. Meats and other proteins Seafood. Poultry without skin. Lean cuts of poultry and beef. Tofu. Nuts. Seeds. Dairy Low-fat or fat-free dairy products such as milk, yogurt, and cheese. The items listed above may not be a complete list of foods and beverages you can eat and drink. Contact a dietitian for more information. What foods should I avoid? Fruits Fruits canned with syrup. Vegetables Canned vegetables. Frozen vegetables with butter or cream sauce. Grains Refined white flour and flour products such as bread, pasta, snack foods, and cereals. Avoid all processed foods. Meats and other proteins Fatty cuts of meat. Poultry with skin. Breaded or fried meats. Processed meat. Avoid saturated fats. Dairy Full-fat yogurt, cheese, or milk. Beverages Sweetened drinks, such as soda or iced tea. The  items listed above may not be a complete list of foods and beverages you should avoid. Contact a dietitian for more information. Questions to ask a health care provider Do I need to meet with a certified diabetes care and education specialist? Do I need to meet with a dietitian? What number can I call if I have questions? When are the best times to check my blood glucose? Where to find more information: American Diabetes Association: diabetes.org Academy of Nutrition and Dietetics: eatright.[employer] of Diabetes and Digestive and Kidney Diseases: [employer]Sync[employer]si Association of Diabetes Care & Education Specialists: diabeteseducator.org Summary It is important to have healthy eating habits because your blood sugar (glucose) levels are greatly affected by what you eat and drink. It is important to use alcohol carefully. A healthy meal plan will help you manage your blood glucose and lower your risk of heart disease. Your health care provider may recommend that you work with a dietitian to make a meal plan that is best for you. This information is not intended to replace advice given to you by your health care provider. Make sure you discuss any questions you have with your health care provider. Document Revised: 08/28/2019 Document Reviewed: 08/28/2019 Elsevier Patient Education  2023 [employer].

## 2021-07-28 ENCOUNTER — Ambulatory Visit (INDEPENDENT_AMBULATORY_CARE_PROVIDER_SITE_OTHER): Payer: No Typology Code available for payment source | Admitting: Nurse Practitioner

## 2021-07-28 ENCOUNTER — Encounter: Payer: Self-pay | Admitting: Nurse Practitioner

## 2021-07-28 VITALS — BP 95/63 | HR 78 | Temp 98.6°F | Ht 64.0 in | Wt 173.2 lb

## 2021-07-28 DIAGNOSIS — Z1211 Encounter for screening for malignant neoplasm of colon: Secondary | ICD-10-CM

## 2021-07-28 DIAGNOSIS — F17219 Nicotine dependence, cigarettes, with unspecified nicotine-induced disorders: Secondary | ICD-10-CM

## 2021-07-28 DIAGNOSIS — E1165 Type 2 diabetes mellitus with hyperglycemia: Secondary | ICD-10-CM | POA: Diagnosis not present

## 2021-07-28 DIAGNOSIS — I9589 Other hypotension: Secondary | ICD-10-CM | POA: Diagnosis not present

## 2021-07-28 DIAGNOSIS — Z794 Long term (current) use of insulin: Secondary | ICD-10-CM

## 2021-07-28 DIAGNOSIS — T451X5A Adverse effect of antineoplastic and immunosuppressive drugs, initial encounter: Secondary | ICD-10-CM

## 2021-07-28 DIAGNOSIS — G620 Drug-induced polyneuropathy: Secondary | ICD-10-CM

## 2021-07-28 DIAGNOSIS — I959 Hypotension, unspecified: Secondary | ICD-10-CM | POA: Insufficient documentation

## 2021-07-28 MED ORDER — PREGABALIN 150 MG PO CAPS: 150.0000 mg | ORAL_CAPSULE | Freq: Two times a day (BID) | ORAL | 2 refills | Status: DC

## 2021-07-28 NOTE — Assessment & Plan Note (Signed)
Chronic, ongoing.  A1c April was 10%, prior was 10.1%. - Will remain of Trulicity - Will continue Basaglar detailed instructions explained, written down and given to patient.  Previously followed by endo, but prefers not to return at this time.  - Will continue Jardiance at current dose and Metformin XR 1000 MG BID.   - New glucometer and supplies sent in last visit, with education on how to check.  Recommend monitor BS at home TID and heavily focus on diet changes.    - Return in 5 weeks for A1c check.

## 2021-07-28 NOTE — Assessment & Plan Note (Signed)
Chronic, ongoing.  Continue current pain management collaboration and medication regimen as prescribed by them.  Lyrica refills sent in, as pain management no longer filling this -- to be filled by PCP.

## 2021-07-28 NOTE — Assessment & Plan Note (Signed)
I have recommended complete cessation of tobacco use. I have discussed various options available for assistance with tobacco cessation including over the counter methods (Nicotine gum, patch and lozenges). We also discussed prescription options (Chantix, Nicotine Inhaler / Nasal Spray). The patient is not interested in pursuing any prescription tobacco cessation options at this time.  Recommend we start lung CT screening, she will plan on this next visit as has had a lot of testing recently.   

## 2021-07-28 NOTE — Assessment & Plan Note (Signed)
Ongoing for several years with baseline BP between 90-110/60-70 on review going back to 2020.  Recommend to continue to take plenty of water intake daily and add a little salt to diet + wear compression hose on during day and off at night.

## 2021-07-28 NOTE — Progress Notes (Signed)
BP 95/63   Pulse 78   Temp 98.6 F (37 C) (Oral)   Ht '5\' 4"'$  (1.626 m)   Wt 173 lb 3.2 oz (78.6 kg)   LMP  (LMP Unknown) Comment: LAST PERIOD IN MAY 2019 WHEN SHE STARTED CHEMO  SpO2 98%   BMI 29.73 kg/m    Subjective:    Patient ID: Carolyn Roy, female    DOB: Nov 15, 1965, 56 y.o.   MRN: [id number]  HPI: Carolyn Roy is a 56 y.o. female  Chief Complaint  Patient presents with   Diabetes    Patient is here to follow up on Diabetes since the change and started of new a medication. Patient states everything is going alright. Patient denies having any concerns at today's visit.    DIABETES Last A1c 10% in April 2023 -- added back on Metformin XR. Continues on Jardiance 25 MG and Basaglar 10 units -- started last visit and stopped Trulicity due to GI issues.  Very concerned with nausea and vomiting, this is the worse she has ever experienced. States it is interfering with work.  Feels better without Trulicity on board.  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  She is a smoker, 1/2 PPD.  Quit for 6 years, but then started back.  She started smoking at age 42, smoked 34-35 years total.  Continues to follow with oncology for breast cancer -- continues on Zometa (for osteoporosis) and Zoladex (hormone).  Her psychiatrist was concerned about lower BP levels -- at baseline 90 to 100 range SBP and 60-70 DBP.  Denies any dizziness, but has had falls in past.  No CP, SOB, syncopal episodes.  Is drinking plenty of fluids.  Hypoglycemic episodes:no Polydipsia/polyuria: no Visual disturbance: no Chest pain: no Paresthesias: no Glucose Monitoring: yes  Accucheck frequency: occasional  Fasting glucose: 140 range -- gets up in middle of night and drinks milk  Post prandial: 200 to 250, trending down some  Evening:   Before meals:  Taking Insulin?: yes  Long acting insulin:   Short acting insulin: Blood Pressure Monitoring: not checking Retinal Examination: Up To  Date Foot Exam: Up to Date Pneumovax: Up To Date Influenza: Up To Date Aspirin: no   Relevant past medical, surgical, family and social history reviewed and updated as indicated. Interim medical history since our last visit reviewed. Allergies and medications reviewed and updated.  Review of Systems  Constitutional:  Negative for activity change, appetite change, fatigue and fever.  Respiratory:  Negative for cough, chest tightness and shortness of breath.   Cardiovascular:  Negative for chest pain, palpitations and leg swelling.  Gastrointestinal: Negative.   Endocrine: Negative for cold intolerance, heat intolerance, polydipsia, polyphagia and polyuria.  Genitourinary:  Negative for difficulty urinating.  Musculoskeletal:  Negative for myalgias.  Neurological: Negative.  Negative for dizziness and light-headedness.  Psychiatric/Behavioral:  Positive for sleep disturbance. Negative for decreased concentration, self-injury and suicidal ideas. The patient is not nervous/anxious.     Per HPI unless specifically indicated above     Objective:    BP 95/63   Pulse 78   Temp 98.6 F (37 C) (Oral)   Ht '5\' 4"'$  (1.626 m)   Wt 173 lb 3.2 oz (78.6 kg)   LMP  (LMP Unknown) Comment: LAST PERIOD IN MAY 2019 WHEN SHE STARTED CHEMO  SpO2 98%   BMI 29.73 kg/m   Wt Readings from Last 3 Encounters:  07/28/21 173 lb 3.2 oz (78.6 kg)  05/31/21  164 lb (74.4 kg)  05/25/21 168 lb 6.4 oz (76.4 kg)    Physical Exam Vitals and nursing note reviewed.  Constitutional:      General: She is awake.     Appearance: She is well-developed and well-groomed.  HENT:     Head: Normocephalic.     Right Ear: Hearing normal.     Left Ear: Hearing normal.  Eyes:     General: Lids are normal.        Right eye: No discharge.        Left eye: No discharge.     Conjunctiva/sclera: Conjunctivae normal.     Pupils: Pupils are equal, round, and reactive to light.  Neck:     Thyroid: No thyromegaly.      Vascular: No carotid bruit.  Cardiovascular:     Rate and Rhythm: Normal rate and regular rhythm.     Pulses:          Dorsalis pedis pulses are 2+ on the right side and 2+ on the left side.     Heart sounds: Normal heart sounds. No murmur heard.    No gallop.  Pulmonary:     Effort: Pulmonary effort is normal. No accessory muscle usage or respiratory distress.     Breath sounds: Normal breath sounds. No wheezing or rhonchi.  Abdominal:     General: Bowel sounds are normal.     Palpations: Abdomen is soft.  Musculoskeletal:     Cervical back: Normal range of motion and neck supple.     Right lower leg: No edema.     Left lower leg: No edema.  Lymphadenopathy:     Cervical: No cervical adenopathy.  Skin:    General: Skin is warm and dry.  Neurological:     Mental Status: She is alert and oriented to person, place, and time.  Psychiatric:        Attention and Perception: Attention normal.        Mood and Affect: Mood normal.        Speech: Speech normal.        Behavior: Behavior normal. Behavior is cooperative.        Thought Content: Thought content normal.    Results for orders placed or performed in visit on 05/25/21  Cancer antigen 27.29  Result Value Ref Range   CA 27.29 <9.0 0.0 - 38.6 U/mL  Comprehensive metabolic panel  Result Value Ref Range   Sodium 134 (L) 135 - 145 mmol/L   Potassium 3.8 3.5 - 5.1 mmol/L   Chloride 101 98 - 111 mmol/L   CO2 24 22 - 32 mmol/L   Glucose, Bld 293 (H) 70 - 99 mg/dL   BUN 10 6 - 20 mg/dL   Creatinine, Ser 0.62 0.44 - 1.00 mg/dL   Calcium 8.8 (L) 8.9 - 10.3 mg/dL   Total Protein 6.4 (L) 6.5 - 8.1 g/dL   Albumin 3.6 3.5 - 5.0 g/dL   AST 18 15 - 41 U/L   ALT 15 0 - 44 U/L   Alkaline Phosphatase 63 38 - 126 U/L   Total Bilirubin 0.4 0.3 - 1.2 mg/dL   GFR, Estimated >60 >60 mL/min   Anion gap 9 5 - 15  CBC with Differential/Platelet  Result Value Ref Range   WBC 6.9 4.0 - 10.5 K/uL   RBC 4.08 3.87 - 5.11 MIL/uL   Hemoglobin  13.0 12.0 - 15.0 g/dL   HCT 39.4 36.0 - 46.0 %  MCV 96.6 80.0 - 100.0 fL   MCH 31.9 26.0 - 34.0 pg   MCHC 33.0 30.0 - 36.0 g/dL   RDW 12.3 11.5 - 15.5 %   Platelets 195 150 - 400 K/uL   nRBC 0.0 0.0 - 0.2 %   Neutrophils Relative % 68 %   Neutro Abs 4.6 1.7 - 7.7 K/uL   Lymphocytes Relative 26 %   Lymphs Abs 1.8 0.7 - 4.0 K/uL   Monocytes Relative 6 %   Monocytes Absolute 0.4 0.1 - 1.0 K/uL   Eosinophils Relative 0 %   Eosinophils Absolute 0.0 0.0 - 0.5 K/uL   Basophils Relative 0 %   Basophils Absolute 0.0 0.0 - 0.1 K/uL   Immature Granulocytes 0 %   Abs Immature Granulocytes 0.02 0.00 - 0.07 K/uL      Assessment & Plan:   Problem List Items Addressed This Visit       Cardiovascular and Mediastinum   Hypotension    Ongoing for several years with baseline BP between 90-110/60-70 on review going back to 2020.  Recommend to continue to take plenty of water intake daily and add a little salt to diet + wear compression hose on during day and off at night.        Endocrine   Type 2 diabetes mellitus with hyperglycemia, with long-term current use of insulin (HCC) - Primary    Chronic, ongoing.  A1c April was 10%, prior was 10.1%. - Will remain of Trulicity - Will continue Basaglar detailed instructions explained, written down and given to patient.  Previously followed by endo, but prefers not to return at this time.  - Will continue Jardiance at current dose and Metformin XR 1000 MG BID.   - New glucometer and supplies sent in last visit, with education on how to check.  Recommend monitor BS at home TID and heavily focus on diet changes.    - Return in 5 weeks for A1c check.        Nervous and Auditory   Chemotherapy-induced neuropathy (HCC) (Chronic)    Chronic, ongoing.  Continue current pain management collaboration and medication regimen as prescribed by them.  Lyrica refills sent in, as pain management no longer filling this -- to be filled by PCP.      Relevant  Medications   pregabalin (LYRICA) 150 MG capsule   Nicotine dependence, cigarettes, w unsp disorders    I have recommended complete cessation of tobacco use. I have discussed various options available for assistance with tobacco cessation including over the counter methods (Nicotine gum, patch and lozenges). We also discussed prescription options (Chantix, Nicotine Inhaler / Nasal Spray). The patient is not interested in pursuing any prescription tobacco cessation options at this time.  Recommend we start lung CT screening, she will plan on this next visit as has had a lot of testing recently.        Other Visit Diagnoses     Colon cancer screening       GI referral placed.   Relevant Orders   Ambulatory referral to Gastroenterology        Follow up plan: Return in about 4 weeks (around 08/25/2021) for T2DM, HLD, BREAST CA, MOOD, CHRONIC PAIN.

## 2021-07-30 ENCOUNTER — Telehealth: Payer: Self-pay

## 2021-07-30 NOTE — Telephone Encounter (Signed)
CALLED PATIENT NO ANSWER LEFT VOICEMAIL FOR A CALL BACK ? ?

## 2021-08-02 ENCOUNTER — Telehealth: Payer: Self-pay

## 2021-08-02 NOTE — Telephone Encounter (Signed)
CALLED PATIENT NO ANSWER LEFT VOICEMAIL FOR A CALL BACK °Letter sent °

## 2021-08-11 ENCOUNTER — Other Ambulatory Visit: Payer: Self-pay

## 2021-08-11 ENCOUNTER — Telehealth: Payer: Self-pay

## 2021-08-11 NOTE — Telephone Encounter (Signed)
CALLED PATIENT NO ANSWER LEFT VOICEMAIL FOR A CALL BACK ? ?

## 2021-08-11 NOTE — Progress Notes (Signed)
Constipated for a week at a time made office visit

## 2021-08-20 ENCOUNTER — Inpatient Hospital Stay: Payer: No Typology Code available for payment source | Attending: Internal Medicine | Admitting: Internal Medicine

## 2021-08-20 ENCOUNTER — Encounter: Payer: Self-pay | Admitting: Internal Medicine

## 2021-08-20 VITALS — BP 87/53 | HR 85 | Temp 98.5°F | Resp 18 | Wt 174.6 lb

## 2021-08-20 DIAGNOSIS — Z79810 Long term (current) use of selective estrogen receptor modulators (SERMs): Secondary | ICD-10-CM | POA: Insufficient documentation

## 2021-08-20 DIAGNOSIS — G43009 Migraine without aura, not intractable, without status migrainosus: Secondary | ICD-10-CM

## 2021-08-20 DIAGNOSIS — Z79899 Other long term (current) drug therapy: Secondary | ICD-10-CM | POA: Diagnosis not present

## 2021-08-20 DIAGNOSIS — F17210 Nicotine dependence, cigarettes, uncomplicated: Secondary | ICD-10-CM | POA: Diagnosis not present

## 2021-08-20 DIAGNOSIS — Z833 Family history of diabetes mellitus: Secondary | ICD-10-CM | POA: Insufficient documentation

## 2021-08-20 DIAGNOSIS — Z803 Family history of malignant neoplasm of breast: Secondary | ICD-10-CM | POA: Insufficient documentation

## 2021-08-20 DIAGNOSIS — Z923 Personal history of irradiation: Secondary | ICD-10-CM | POA: Insufficient documentation

## 2021-08-20 DIAGNOSIS — Z886 Allergy status to analgesic agent status: Secondary | ICD-10-CM | POA: Diagnosis not present

## 2021-08-20 DIAGNOSIS — F32A Depression, unspecified: Secondary | ICD-10-CM | POA: Diagnosis not present

## 2021-08-20 DIAGNOSIS — Z9221 Personal history of antineoplastic chemotherapy: Secondary | ICD-10-CM | POA: Insufficient documentation

## 2021-08-20 DIAGNOSIS — Z170 Estrogen receptor positive status [ER+]: Secondary | ICD-10-CM | POA: Insufficient documentation

## 2021-08-20 DIAGNOSIS — C50812 Malignant neoplasm of overlapping sites of left female breast: Secondary | ICD-10-CM | POA: Diagnosis present

## 2021-08-20 DIAGNOSIS — T451X5A Adverse effect of antineoplastic and immunosuppressive drugs, initial encounter: Secondary | ICD-10-CM | POA: Diagnosis not present

## 2021-08-20 DIAGNOSIS — Z90721 Acquired absence of ovaries, unilateral: Secondary | ICD-10-CM | POA: Diagnosis not present

## 2021-08-20 DIAGNOSIS — G620 Drug-induced polyneuropathy: Secondary | ICD-10-CM

## 2021-08-20 DIAGNOSIS — Z8379 Family history of other diseases of the digestive system: Secondary | ICD-10-CM | POA: Diagnosis not present

## 2021-08-20 DIAGNOSIS — Z800 Family history of malignant neoplasm of digestive organs: Secondary | ICD-10-CM | POA: Diagnosis not present

## 2021-08-20 DIAGNOSIS — Z5111 Encounter for antineoplastic chemotherapy: Secondary | ICD-10-CM | POA: Insufficient documentation
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
## 2021-08-21 NOTE — Patient Instructions (Addendum)
Januvia 100 MG daily  Diabetes Mellitus and Nutrition, Adult When you have diabetes, or diabetes mellitus, it is very important to have healthy eating habits because your blood sugar (glucose) levels are greatly affected by what you eat and drink. Eating healthy foods in the right amounts, at about the same times every day, can help you: Manage your blood glucose. Lower your risk of heart disease. Improve your blood pressure. Reach or maintain a healthy weight. What can affect my meal plan? Every person with diabetes is different, and each person has different needs for a meal plan. Your health care provider may recommend that you work with a dietitian to make a meal plan that is best for you. Your meal plan may vary depending on factors such as: The calories you need. The medicines you take. Your weight. Your blood glucose, blood pressure, and cholesterol levels. Your activity level. Other health conditions you have, such as heart or kidney disease. How do carbohydrates affect me? Carbohydrates, also called carbs, affect your blood glucose level more than any other type of food. Eating carbs raises the amount of glucose in your blood. It is important to know how many carbs you can safely have in each meal. This is different for every person. Your dietitian can help you calculate how many carbs you should have at each meal and for each snack. How does alcohol affect me? Alcohol can cause a decrease in blood glucose (hypoglycemia), especially if you use insulin or take certain diabetes medicines by mouth. Hypoglycemia can be a life-threatening condition. Symptoms of hypoglycemia, such as sleepiness, dizziness, and confusion, are similar to symptoms of having too much alcohol. Do not drink alcohol if: Your health care provider tells you not to drink. You are pregnant, may be pregnant, or are planning to become pregnant. If you drink alcohol: Limit how much you have to: 0-1 drink a day for  women. 0-2 drinks a day for men. Know how much alcohol is in your drink. In the [place] one drink equals one 12 oz bottle of beer (355 mL), one 5 oz glass of wine (148 mL), or one 1 oz glass of hard liquor (44 mL). Keep yourself hydrated with water, diet soda, or unsweetened iced tea. Keep in mind that regular soda, juice, and other mixers may contain a lot of sugar and must be counted as carbs. What are tips for following this plan?  Reading food labels Start by checking the serving size on the Nutrition Facts label of packaged foods and drinks. The number of calories and the amount of carbs, fats, and other nutrients listed on the label are based on one serving of the item. Many items contain more than one serving per package. Check the total grams (g) of carbs in one serving. Check the number of grams of saturated fats and trans fats in one serving. Choose foods that have a low amount or none of these fats. Check the number of milligrams (mg) of salt (sodium) in one serving. Most people should limit total sodium intake to less than 2,300 mg per day. Always check the nutrition information of foods labeled as "low-fat" or "nonfat." These foods may be higher in added sugar or refined carbs and should be avoided. Talk to your dietitian to identify your daily goals for nutrients listed on the label. Shopping Avoid buying canned, pre-made, or processed foods. These foods tend to be high in fat, sodium, and added sugar. Shop around the outside edge of the grocery  store. This is where you will most often find fresh fruits and vegetables, bulk grains, fresh meats, and fresh dairy products. Cooking Use low-heat cooking methods, such as baking, instead of high-heat cooking methods, such as deep frying. Cook using healthy oils, such as olive, canola, or sunflower oil. Avoid cooking with butter, cream, or high-fat meats. Meal planning Eat meals and snacks regularly, preferably at the same times every day.  Avoid going long periods of time without eating. Eat foods that are high in fiber, such as fresh fruits, vegetables, beans, and whole grains. Eat 4-6 oz (112-168 g) of lean protein each day, such as lean meat, chicken, fish, eggs, or tofu. One ounce (oz) (28 g) of lean protein is equal to: 1 oz (28 g) of meat, chicken, or fish. 1 egg.  cup (62 g) of tofu. Eat some foods each day that contain healthy fats, such as avocado, nuts, seeds, and fish. What foods should I eat? Fruits Berries. Apples. Oranges. Peaches. Apricots. Plums. Grapes. Mangoes. Papayas. Pomegranates. Kiwi. Cherries. Vegetables Leafy greens, including lettuce, spinach, kale, chard, collard greens, mustard greens, and cabbage. Beets. Cauliflower. Broccoli. Carrots. Green beans. Tomatoes. Peppers. Onions. Cucumbers. Brussels sprouts. Grains Whole grains, such as whole-wheat or whole-grain bread, crackers, tortillas, cereal, and pasta. Unsweetened oatmeal. Quinoa. Brown or wild rice. Meats and other proteins Seafood. Poultry without skin. Lean cuts of poultry and beef. Tofu. Nuts. Seeds. Dairy Low-fat or fat-free dairy products such as milk, yogurt, and cheese. The items listed above may not be a complete list of foods and beverages you can eat and drink. Contact a dietitian for more information. What foods should I avoid? Fruits Fruits canned with syrup. Vegetables Canned vegetables. Frozen vegetables with butter or cream sauce. Grains Refined white flour and flour products such as bread, pasta, snack foods, and cereals. Avoid all processed foods. Meats and other proteins Fatty cuts of meat. Poultry with skin. Breaded or fried meats. Processed meat. Avoid saturated fats. Dairy Full-fat yogurt, cheese, or milk. Beverages Sweetened drinks, such as soda or iced tea. The items listed above may not be a complete list of foods and beverages you should avoid. Contact a dietitian for more information. Questions to ask a health  care provider Do I need to meet with a certified diabetes care and education specialist? Do I need to meet with a dietitian? What number can I call if I have questions? When are the best times to check my blood glucose? Where to find more information: American Diabetes Association: diabetes.org Academy of Nutrition and Dietetics: eatright.[employer] of Diabetes and Digestive and Kidney Diseases: [employer]is Association of Diabetes Care & Education Specialists: diabeteseducator.org Summary It is important to have healthy eating habits because your blood sugar (glucose) levels are greatly affected by what you eat and drink. It is important to use alcohol carefully. A healthy meal plan will help you manage your blood glucose and lower your risk of heart disease. Your health care provider may recommend that you work with a dietitian to make a meal plan that is best for you. This information is not intended to replace advice given to you by your health care provider. Make sure you discuss any questions you have with your health care provider. Document Revised: 08/28/2019 Document Reviewed: 08/28/2019 Elsevier Patient Education  [employer].

## 2021-08-25 ENCOUNTER — Other Ambulatory Visit: Payer: Self-pay | Admitting: Nurse Practitioner

## 2021-08-25 ENCOUNTER — Ambulatory Visit (INDEPENDENT_AMBULATORY_CARE_PROVIDER_SITE_OTHER): Payer: No Typology Code available for payment source | Admitting: Nurse Practitioner

## 2021-08-25 ENCOUNTER — Encounter: Payer: Self-pay | Admitting: Nurse Practitioner

## 2021-08-25 VITALS — BP 90/59 | HR 80 | Temp 98.2°F | Ht 64.0 in | Wt 173.4 lb

## 2021-08-25 DIAGNOSIS — F17219 Nicotine dependence, cigarettes, with unspecified nicotine-induced disorders: Secondary | ICD-10-CM

## 2021-08-25 DIAGNOSIS — E1169 Type 2 diabetes mellitus with other specified complication: Secondary | ICD-10-CM | POA: Diagnosis not present

## 2021-08-25 DIAGNOSIS — G620 Drug-induced polyneuropathy: Secondary | ICD-10-CM

## 2021-08-25 DIAGNOSIS — C50512 Malignant neoplasm of lower-outer quadrant of left female breast: Secondary | ICD-10-CM | POA: Diagnosis not present

## 2021-08-25 DIAGNOSIS — F1120 Opioid dependence, uncomplicated: Secondary | ICD-10-CM

## 2021-08-25 DIAGNOSIS — G43009 Migraine without aura, not intractable, without status migrainosus: Secondary | ICD-10-CM

## 2021-08-25 DIAGNOSIS — I9589 Other hypotension: Secondary | ICD-10-CM

## 2021-08-25 DIAGNOSIS — F322 Major depressive disorder, single episode, severe without psychotic features: Secondary | ICD-10-CM

## 2021-08-25 DIAGNOSIS — F19982 Other psychoactive substance use, unspecified with psychoactive substance-induced sleep disorder: Secondary | ICD-10-CM

## 2021-08-25 DIAGNOSIS — E1165 Type 2 diabetes mellitus with hyperglycemia: Secondary | ICD-10-CM

## 2021-08-25 DIAGNOSIS — E785 Hyperlipidemia, unspecified: Secondary | ICD-10-CM

## 2021-08-25 DIAGNOSIS — T451X5A Adverse effect of antineoplastic and immunosuppressive drugs, initial encounter: Secondary | ICD-10-CM

## 2021-08-25 DIAGNOSIS — I951 Orthostatic hypotension: Secondary | ICD-10-CM

## 2021-08-25 DIAGNOSIS — Z170 Estrogen receptor positive status [ER+]: Secondary | ICD-10-CM

## 2021-08-25 DIAGNOSIS — Z794 Long term (current) use of insulin: Secondary | ICD-10-CM

## 2021-08-25 DIAGNOSIS — G894 Chronic pain syndrome: Secondary | ICD-10-CM

## 2021-08-25 LAB — BAYER DCA HB A1C WAIVED: HB A1C (BAYER DCA - WAIVED): 10.6 % — ABNORMAL HIGH (ref 4.8–5.6)

## 2021-08-25 MED ORDER — SITAGLIPTIN PHOSPHATE 100 MG PO TABS: 100.0000 mg | ORAL_TABLET | Freq: Every day | ORAL | 4 refills | Status: DC

## 2021-08-25 NOTE — Assessment & Plan Note (Signed)
Followed by Dr. Kapur with psychiatry, continue this collaboration. 

## 2021-08-25 NOTE — Assessment & Plan Note (Signed)
Chronic, ongoing.  Continue current pain management collaboration and medication regimen as prescribed by them. 

## 2021-08-25 NOTE — Assessment & Plan Note (Signed)
Followed by pain management, continue this collaboration, recent notes reviewed. 

## 2021-08-25 NOTE — Assessment & Plan Note (Signed)
Chronic, ongoing.  Imitrex as needed.  Continue this regimen and adjust as needed. 

## 2021-08-25 NOTE — Assessment & Plan Note (Addendum)
Ongoing for several years with baseline BP between 90-110/60-70 on review going back to 2020 -- however recently with lower levels noted and having some dizziness.  Recommend to continue to take plenty of water intake daily and add a some salt to diet + wear compression hose on during day and off at night.  Referral to cardiology.

## 2021-08-25 NOTE — Assessment & Plan Note (Signed)
I have recommended complete cessation of tobacco use. I have discussed various options available for assistance with tobacco cessation including over the counter methods (Nicotine gum, patch and lozenges). We also discussed prescription options (Chantix, Nicotine Inhaler / Nasal Spray). The patient is not interested in pursuing any prescription tobacco cessation options at this time.  Recommend we start lung CT screening, she will plan on this next visit as has had a lot of testing recently.   

## 2021-08-25 NOTE — Progress Notes (Signed)
BP (!) 90/59   Pulse 80   Temp 98.2 F (36.8 C) (Oral)   Ht '5\' 4"'$  (1.626 m)   Wt 173 lb 6.4 oz (78.7 kg)   LMP  (LMP Unknown) Comment: LAST PERIOD IN MAY 2019 WHEN SHE STARTED CHEMO  SpO2 94%   BMI 29.76 kg/m    Subjective:    Patient ID: Carolyn Roy, female    DOB: Jun 24, 1965, 56 y.o.   MRN: [id number]  HPI: Carolyn Roy is a 56 y.o. female  Chief Complaint  Patient presents with   Diabetes   Hyperlipidemia   Breast Cancer   Mood   Pain   Hypertension    Patient says she has recently noticed a drop in her blood pressure readings. Patient says at her appointment Friday with Neurologist at the [facility] and her reading was 71/53 and then they performed Orthostatics on the patient made her stand up to recheck blood pressure. Patient says it went down when she stood up. Patient says she was told to increase her salt intake and referred her to Cardiology.    DIABETES Last A1c 10% April. Continues on Metformin 1000 MG BID and restarted Basaglar in April.  Can not take Jardiance at this time as too expensive.  Trulicity stopped due to GI effects.  No steroid use recently.  Followed by pain clinic for chronic pain -- last visit 05/31/21 continues Oxycodone.    She is a smoker, 1/2 PPD -- she is working on patches via [employer]  Quit for 6 years, but then started back.  She started smoking at age 13, smoked 34-35 years total. Hypoglycemic episodes:no Polydipsia/polyuria: no Visual disturbance: no Chest pain: no Paresthesias: no Glucose Monitoring: yes  Accucheck frequency: occasional  Fasting glucose: 100 to 110  Post prandial: 168-170  Evening:   Before meals:  Taking Insulin?: yes  Long acting insulin: 24 units  Short acting insulin: Blood Pressure Monitoring: not checking Retinal Examination: Up To Date Foot Exam: Up to Date Pneumovax: Up To Date Influenza: Up To Date Aspirin: no   HYPOTENSION Recent episodes of hypotension noted at oncology  visits.  She reports at July 14th visit they discussed referral to cardiology, but she has not heard from anyone.  They told her to increase sodium, which she has been doing. Recurrent headaches: at times at baseline migraines Visual changes: none Palpitations: occasional, baseline Dyspnea: no Chest pain: no Lower extremity edema: no Dizzy/lightheaded: yes    HYPERLIPIDEMIA Taking Rosuvastatin 40 MG daily.  Had sleep study at [facility], she reports Dr. Mickeal Skinner told her they want to do a CPAP titration, but they are going to hold off on this due to her current lower BP levels. Satisfied with current treatment? yes Duration of hyperlipidemia: chronic Cholesterol medication side effects: no Cholesterol supplements: none Aspirin: no Recent stressors: no Recurrent headaches: no Visual changes: no Palpitations: no Dyspnea: no Chest pain: no Lower extremity edema: no Dizzy/lightheaded: no  The 10-year ASCVD risk score (Arnett DK, et al., 2019) is: 3.5%   Values used to calculate the score:     Age: 68 years     Sex: Female     Is Non-Hispanic African American: No     Diabetic: Yes     Tobacco smoker: Yes     Systolic Blood Pressure: 90 mmHg     Is BP treated: No     HDL Cholesterol: 63 mg/dL     Total Cholesterol: 184 mg/dL  DEPRESSION Is to be on Duloxetine 60 MG daily + Amitriptyline.  Followed by Dr. Nicolasa Ducking.  Is currently followed by oncology for breast CA, left side.  Last saw oncology on 08/20/21 -- she is completed treatments, continues Tamoxifen and goes every 6 months for injections and infusions. Mood status: stable  States she occasionally takes duloxetine. Satisfied with current treatment?: yes Symptom severity: stable to mild  Duration of current treatment : chronic Side effects: no Medication compliance: good compliance Psychotherapy/counseling: none Depressed mood: no Anxious mood: no Anhedonia: no Significant weight loss or gain: no Insomnia: yes hard to  stay asleep Fatigue: no Feelings of worthlessness or guilt: no Impaired concentration/indecisiveness: no Suicidal ideations: no Hopelessness: no Crying spells: Yes, has become very frequent    08/25/2021    2:14 PM 07/28/2021    2:50 PM 05/31/2021    2:05 PM 05/14/2021   11:43 AM 02/12/2021    3:02 PM  Depression screen PHQ 2/9  Decreased Interest 0 1 0 1 0  Down, Depressed, Hopeless 0 0 0 1 0  PHQ - 2 Score 0 1 0 2 0  Altered sleeping '1 1  2 2  '$ Tired, decreased energy '1 1  1 1  '$ Change in appetite '1 1  2 1  '$ Feeling bad or failure about yourself  0 0  0 0  Trouble concentrating 0 0  1 0  Moving slowly or fidgety/restless 1 0  0 0  Suicidal thoughts 0 0  0 0  PHQ-9 Score '4 4  8 4  '$ Difficult doing work/chores Somewhat difficult Not difficult at all         08/25/2021    2:14 PM 07/28/2021    2:51 PM 05/14/2021   11:43 AM 02/12/2021    3:03 PM  GAD 7 : Generalized Anxiety Score  Nervous, Anxious, on Edge '1 1 1 '$ 0  Control/stop worrying 0 0 0 0  Worry too much - different things 1 1 0 0  Trouble relaxing 0 0 1 0  Restless 0 0 1 0  Easily annoyed or irritable 0 1 1 0  Afraid - awful might happen 0 1 0 0  Total GAD 7 Score '2 4 4 '$ 0  Anxiety Difficulty Not difficult at all Not difficult at all Somewhat difficult     Relevant past medical, surgical, family and social history reviewed and updated as indicated. Interim medical history since our last visit reviewed. Allergies and medications reviewed and updated.  Review of Systems  Constitutional:  Negative for activity change, appetite change, fatigue and fever.  Respiratory:  Negative for cough, chest tightness and shortness of breath.   Cardiovascular:  Negative for chest pain, palpitations and leg swelling.  Gastrointestinal: Negative.   Endocrine: Positive for polyuria. Negative for cold intolerance, heat intolerance, polydipsia and polyphagia.  Genitourinary:  Negative for difficulty urinating.  Musculoskeletal:  Negative for  myalgias.  Neurological: Negative.  Negative for dizziness and light-headedness.  Psychiatric/Behavioral:  Positive for sleep disturbance. Negative for decreased concentration, self-injury and suicidal ideas. The patient is not nervous/anxious.     Per HPI unless specifically indicated above     Objective:    BP (!) 90/59   Pulse 80   Temp 98.2 F (36.8 C) (Oral)   Ht '5\' 4"'$  (1.626 m)   Wt 173 lb 6.4 oz (78.7 kg)   LMP  (LMP Unknown) Comment: LAST PERIOD IN MAY 2019 WHEN SHE STARTED CHEMO  SpO2 94%   BMI 29.76 kg/m  Wt Readings from Last 3 Encounters:  08/25/21 173 lb 6.4 oz (78.7 kg)  08/20/21 174 lb 9.6 oz (79.2 kg)  07/28/21 173 lb 3.2 oz (78.6 kg)    Physical Exam Vitals and nursing note reviewed.  Constitutional:      General: She is awake.     Appearance: She is well-developed and well-groomed.  HENT:     Head: Normocephalic.     Right Ear: Hearing normal.     Left Ear: Hearing normal.  Eyes:     General: Lids are normal.        Right eye: No discharge.        Left eye: No discharge.     Conjunctiva/sclera: Conjunctivae normal.     Pupils: Pupils are equal, round, and reactive to light.  Neck:     Thyroid: No thyromegaly.     Vascular: No carotid bruit.  Cardiovascular:     Rate and Rhythm: Normal rate and regular rhythm.     Pulses:          Dorsalis pedis pulses are 2+ on the right side and 2+ on the left side.     Heart sounds: Normal heart sounds. No murmur heard.    No gallop.  Pulmonary:     Effort: Pulmonary effort is normal. No accessory muscle usage or respiratory distress.     Breath sounds: Normal breath sounds. No wheezing or rhonchi.  Abdominal:     General: Bowel sounds are normal.     Palpations: Abdomen is soft.  Musculoskeletal:     Cervical back: Normal range of motion and neck supple.     Right lower leg: No edema.     Left lower leg: No edema.  Lymphadenopathy:     Cervical: No cervical adenopathy.  Skin:    General: Skin is warm  and dry.  Neurological:     Mental Status: She is alert and oriented to person, place, and time.     Cranial Nerves: Cranial nerves 2-12 are intact.     Motor: Motor function is intact.     Coordination: Coordination is intact.     Gait: Gait is intact.     Deep Tendon Reflexes: Reflexes are normal and symmetric.     Reflex Scores:      Brachioradialis reflexes are 2+ on the right side and 2+ on the left side.      Patellar reflexes are 2+ on the right side and 2+ on the left side. Psychiatric:        Attention and Perception: Attention normal.        Mood and Affect: Mood normal.        Speech: Speech normal.        Behavior: Behavior normal. Behavior is cooperative.        Thought Content: Thought content normal.    Results for orders placed or performed in visit on 08/25/21  Bayer DCA Hb A1c Waived  Result Value Ref Range   HB A1C (BAYER DCA - WAIVED) 10.6 (H) 4.8 - 5.6 %      Assessment & Plan:   Problem List Items Addressed This Visit       Cardiovascular and Mediastinum   Hypotension    Ongoing for several years with baseline BP between 90-110/60-70 on review going back to 2020 -- however recently with lower levels noted and having some dizziness.  Recommend to continue to take plenty of water intake daily and add a some salt  to diet + wear compression hose on during day and off at night.  Referral to cardiology.      Relevant Orders   Ambulatory referral to Cardiology   Migraine headache without aura    Chronic, ongoing.  Imitrex as needed.  Continue this regimen and adjust as needed.        Endocrine   Hyperlipidemia associated with type 2 diabetes mellitus (HCC)    Chronic, ongoing.  Continue Rosuvastatin 40 MG daily.  Lipid panel today and adjust dose as needed.      Relevant Medications   sitaGLIPtin (JANUVIA) 100 MG tablet   Other Relevant Orders   Bayer DCA Hb A1c Waived (Completed)   Lipid Panel w/o Chol/HDL Ratio   Type 2 diabetes mellitus with  hyperglycemia, with long-term current use of insulin (HCC) - Primary    Chronic, ongoing.  A1c April was 10%, prior was 10.1% today is 10.6%, trending up. Eye exam and foot exam up to date.  Not taking Jardiance due to cost. - Increase Basaglar to 28 units, written down instructions and given to patient.  Previously followed by endo, but prefers not to return at this time.  - Will continue Metformin XR 1000 MG BID and see if Januvia 100 MG is option and less costly.  Monitor for ADR. -  Recommend monitor BS at home TID and heavily focus on diet changes.    - Return in 3 months for A1c check.  Will have return to endo if ongoing poor control.      Relevant Medications   sitaGLIPtin (JANUVIA) 100 MG tablet   Other Relevant Orders   Bayer DCA Hb A1c Waived (Completed)   Basic metabolic panel     Nervous and Auditory   Chemotherapy-induced neuropathy (HCC) (Chronic)    Chronic, ongoing.  Continue current pain management collaboration and medication regimen as prescribed by them.      Relevant Orders   Basic metabolic panel   Nicotine dependence, cigarettes, w unsp disorders    I have recommended complete cessation of tobacco use. I have discussed various options available for assistance with tobacco cessation including over the counter methods (Nicotine gum, patch and lozenges). We also discussed prescription options (Chantix, Nicotine Inhaler / Nasal Spray). The patient is not interested in pursuing any prescription tobacco cessation options at this time.  Recommend we start lung CT screening, she will plan on this next visit as has had a lot of testing recently.          Other   Chronic pain syndrome (Chronic)    Followed by pain management, continue this collaboration, recent notes reviewed.      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  Nicolasa Ducking with psychiatry, continue this collaboration.      Malignant neoplasm of lower-outer quadrant of left breast of female, estrogen receptor positive ([place])    Continue collaboration with oncology, recent note reviewed.      Severe depression (HCC)    Chronic, ongoing.  Continue current medication regimen and collaboration with psychiatry.  Currently denies SI/HI.        Follow up plan: Return in about 3 months (around 11/25/2021) for T2DM, HTN/HLD, MOOD, CREAST CA, PAIN, GERD.

## 2021-08-25 NOTE — Assessment & Plan Note (Signed)
Chronic, ongoing.  Continue Rosuvastatin 40 MG daily.  Lipid panel today and adjust dose as needed. 

## 2021-08-25 NOTE — Assessment & Plan Note (Signed)
Continue collaboration with oncology, recent note reviewed. 

## 2021-08-25 NOTE — Assessment & Plan Note (Signed)
Chronic, ongoing.  A1c April was 10%, prior was 10.1% today is 10.6%, trending up. Eye exam and foot exam up to date.  Not taking Jardiance due to cost. - Increase Basaglar to 28 units, written down instructions and given to patient.  Previously followed by endo, but prefers not to return at this time.  - Will continue Metformin XR 1000 MG BID and see if Januvia 100 MG is option and less costly.  Monitor for ADR. -  Recommend monitor BS at home TID and heavily focus on diet changes.    - Return in 3 months for A1c check.  Will have return to endo if ongoing poor control.

## 2021-08-25 NOTE — Assessment & Plan Note (Signed)
Chronic, ongoing.  Continue current medication regimen and collaboration with psychiatry.  Currently denies SI/HI. 

## 2021-08-26 ENCOUNTER — Other Ambulatory Visit: Payer: Self-pay

## 2021-08-26 ENCOUNTER — Other Ambulatory Visit: Payer: Self-pay | Admitting: Nurse Practitioner

## 2021-08-26 DIAGNOSIS — C50512 Malignant neoplasm of lower-outer quadrant of left female breast: Secondary | ICD-10-CM

## 2021-08-26 DIAGNOSIS — E876 Hypokalemia: Secondary | ICD-10-CM

## 2021-08-26 DIAGNOSIS — Z170 Estrogen receptor positive status [ER+]: Secondary | ICD-10-CM

## 2021-08-26 LAB — LIPID PANEL W/O CHOL/HDL RATIO
Cholesterol, Total: 116 mg/dL (ref 100–199)
HDL: 44 mg/dL (ref >39)
LDL Chol Calc (NIH): 52 mg/dL (ref 0–99)
Triglycerides: 107 mg/dL (ref 0–149)
VLDL Cholesterol Cal: 20 mg/dL (ref 5–40)

## 2021-08-26 LAB — BASIC METABOLIC PANEL
BUN/Creatinine Ratio: 18 (ref 9–23)
BUN: 16 mg/dL (ref 6–24)
CO2: 25 mmol/L (ref 20–29)
Calcium: 9.1 mg/dL (ref 8.7–10.2)
Chloride: 102 mmol/L (ref 96–106)
Creatinine, Ser: 0.9 mg/dL (ref 0.57–1.00)
Glucose: 128 mg/dL — ABNORMAL HIGH (ref 70–99)
Potassium: 3.1 mmol/L — ABNORMAL LOW (ref 3.5–5.2)
Sodium: 142 mmol/L (ref 134–144)
eGFR: 76 mL/min/{1.73_m2} (ref >59)

## 2021-08-26 MED ORDER — POTASSIUM CHLORIDE CRYS ER 10 MEQ PO TBCR: 10.0000 meq | EXTENDED_RELEASE_TABLET | Freq: Every day | ORAL | 0 refills | Status: DC

## 2021-08-26 NOTE — Progress Notes (Signed)
Contacted via [place] -- need lab only visit one week please   Good afternoon Seba, your labs have returned: - Kidney function, creatinine and eGFR, remains normal; however, your potassium level is on low side.  I will send in 5 days of potassium for you to take and would like to recheck this in one week on outpatient labs. - Cholesterol labs remain are now at goal, continue statin.  Any questions? Keep being amazing!!  Thank you for allowing me to participate in your care.  I appreciate you. Kindest regards, Jamyah Folk

## 2021-08-26 NOTE — Telephone Encounter (Signed)
Requested medication (s) are due for refill today:   Yes  Requested medication (s) are on the active medication list:   Alternative needed  Future visit scheduled:   Yes   Last ordered: Yesterday 7/19  Returned because insurance not cover this.    Alternative being requested    Requested Prescriptions  Pending Prescriptions Disp Refills   TRADJENTA 5 MG TABS tablet [Pharmacy Med Name: TRADJENTA 5 MG TABLET]  0    Sig: Take 1 tablet (100 mg total) by mouth daily.     Endocrinology:  Diabetes - DPP-4 Inhibitors - linagliptin Failed - 08/25/2021  3:52 PM      Failed - HBA1C is between 0 and 7.9 and within 180 days    HB A1C (BAYER DCA - WAIVED)  Date Value Ref Range Status  08/25/2021 10.6 (H) 4.8 - 5.6 % Final    Comment:             Prediabetes: 5.7 - 6.4          Diabetes: >6.4          Glycemic control for adults with diabetes: <7.0          Passed - Valid encounter within last 6 months    Recent Outpatient Visits           Yesterday Type 2 diabetes mellitus with hyperglycemia, with long-term current use of insulin ([place])   [place], Jolene T, NP   4 weeks ago Type 2 diabetes mellitus with hyperglycemia, with long-term current use of insulin ([place])   [place], Jolene T, NP   3 months ago Type 2 diabetes mellitus with hyperglycemia, with long-term current use of insulin ([place])   [place], Jolene T, NP   6 months ago Type 2 diabetes mellitus with hyperglycemia, with long-term current use of insulin ([place])   [place], Ruby T, NP   8 months ago Gastroesophageal reflux disease without esophagitis   [place], Barbaraann Faster, NP       Future Appointments             In 4 days [place], Greer Ee, MD [place]   In 3 months [place], Barbaraann Faster, NP [employer], [facility]

## 2021-08-27 ENCOUNTER — Encounter: Payer: Self-pay | Admitting: Oncology

## 2021-08-27 ENCOUNTER — Ambulatory Visit: Payer: No Typology Code available for payment source | Admitting: Internal Medicine

## 2021-08-27 ENCOUNTER — Ambulatory Visit: Payer: No Typology Code available for payment source

## 2021-08-27 ENCOUNTER — Inpatient Hospital Stay (HOSPITAL_BASED_OUTPATIENT_CLINIC_OR_DEPARTMENT_OTHER): Payer: No Typology Code available for payment source | Admitting: Oncology

## 2021-08-27 ENCOUNTER — Inpatient Hospital Stay: Payer: No Typology Code available for payment source

## 2021-08-27 VITALS — BP 101/66 | HR 84 | Temp 98.7°F | Resp 20 | Wt 174.9 lb

## 2021-08-27 DIAGNOSIS — C50512 Malignant neoplasm of lower-outer quadrant of left female breast: Secondary | ICD-10-CM | POA: Diagnosis not present

## 2021-08-27 DIAGNOSIS — Z170 Estrogen receptor positive status [ER+]: Secondary | ICD-10-CM

## 2021-08-27 DIAGNOSIS — Z5111 Encounter for antineoplastic chemotherapy: Secondary | ICD-10-CM | POA: Diagnosis not present

## 2021-08-27 LAB — CBC WITH DIFFERENTIAL/PLATELET
Abs Immature Granulocytes: 0.01 10*3/uL (ref 0.00–0.07)
Basophils Absolute: 0 10*3/uL (ref 0.0–0.1)
Basophils Relative: 0 %
Eosinophils Absolute: 0 10*3/uL (ref 0.0–0.5)
Eosinophils Relative: 1 %
HCT: 38.4 % (ref 36.0–46.0)
Hemoglobin: 12.6 g/dL (ref 12.0–15.0)
Immature Granulocytes: 0 %
Lymphocytes Relative: 28 %
Lymphs Abs: 2.2 10*3/uL (ref 0.7–4.0)
MCH: 31.7 pg (ref 26.0–34.0)
MCHC: 32.8 g/dL (ref 30.0–36.0)
MCV: 96.5 fL (ref 80.0–100.0)
Monocytes Absolute: 0.5 10*3/uL (ref 0.1–1.0)
Monocytes Relative: 6 %
Neutro Abs: 5.1 10*3/uL (ref 1.7–7.7)
Neutrophils Relative %: 65 %
Platelets: 177 10*3/uL (ref 150–400)
RBC: 3.98 MIL/uL (ref 3.87–5.11)
RDW: 12.7 % (ref 11.5–15.5)
WBC: 7.9 10*3/uL (ref 4.0–10.5)
nRBC: 0 % (ref 0.0–0.2)

## 2021-08-27 LAB — COMPREHENSIVE METABOLIC PANEL
ALT: 14 U/L (ref 0–44)
AST: 21 U/L (ref 15–41)
Albumin: 3.5 g/dL (ref 3.5–5.0)
Alkaline Phosphatase: 52 U/L (ref 38–126)
Anion gap: 8 (ref 5–15)
BUN: 16 mg/dL (ref 6–20)
CO2: 26 mmol/L (ref 22–32)
Calcium: 8.5 mg/dL — ABNORMAL LOW (ref 8.9–10.3)
Chloride: 106 mmol/L (ref 98–111)
Creatinine, Ser: 0.82 mg/dL (ref 0.44–1.00)
GFR, Estimated: >60 mL/min (ref >60)
Glucose, Bld: 152 mg/dL — ABNORMAL HIGH (ref 70–99)
Potassium: 3.1 mmol/L — ABNORMAL LOW (ref 3.5–5.1)
Sodium: 140 mmol/L (ref 135–145)
Total Bilirubin: 0.5 mg/dL (ref 0.3–1.2)
Total Protein: 6.3 g/dL — ABNORMAL LOW (ref 6.5–8.1)

## 2021-08-27 MED ORDER — SODIUM CHLORIDE 0.9 % IV SOLN
Freq: Once | INTRAVENOUS | Status: AC
  Filled 2021-08-27: qty 250

## 2021-08-27 MED ORDER — GOSERELIN ACETATE 10.8 MG ~~LOC~~ IMPL
10.8000 mg | DRUG_IMPLANT | Freq: Once | SUBCUTANEOUS | Status: AC
  Administered 2021-08-27: 10.8 mg via SUBCUTANEOUS
  Filled 2021-08-27: qty 10.8

## 2021-08-27 MED ORDER — SODIUM CHLORIDE 0.9% FLUSH
10.0000 mL | Freq: Once | INTRAVENOUS | Status: DC
  Filled 2021-08-27: qty 10

## 2021-08-27 MED ORDER — HEPARIN SOD (PORK) LOCK FLUSH 100 UNIT/ML IV SOLN
500.0000 [IU] | Freq: Once | INTRAVENOUS | Status: AC
  Filled 2021-08-27: qty 5

## 2021-08-27 MED ORDER — HEPARIN SOD (PORK) LOCK FLUSH 100 UNIT/ML IV SOLN
INTRAVENOUS | Status: AC
  Administered 2021-08-27: 500 [IU] via INTRAVENOUS
  Filled 2021-08-27: qty 5

## 2021-08-27 MED ORDER — ZOLEDRONIC ACID 4 MG/5ML IV CONC: 4.0000 mg | Freq: Once | INTRAVENOUS | Status: DC

## 2021-08-27 MED ORDER — ZOLEDRONIC ACID 4 MG/100ML IV SOLN
4.0000 mg | Freq: Once | INTRAVENOUS | Status: AC
  Administered 2021-08-27: 4 mg via INTRAVENOUS
  Filled 2021-08-27: qty 100

## 2021-08-27 NOTE — Patient Instructions (Signed)
[facility] AT [place]  Discharge Instructions: Thank you for choosing [facility] to provide your oncology and hematology care.  If you have a lab appointment with the [facility], please go directly to the [facility] and check in at the registration area.  Wear comfortable clothing and clothing appropriate for easy access to any Portacath or PICC line.   We strive to give you quality time with your provider. You may need to reschedule your appointment if you arrive late (15 or more minutes).  Arriving late affects you and other patients whose appointments are after yours.  Also, if you miss three or more appointments without notifying the office, you may be dismissed from the clinic at the provider's discretion.      For prescription refill requests, have your pharmacy contact our office and allow 72 hours for refills to be completed.    Today you received the following chemotherapy and/or immunotherapy agents Zometa & Zoladex      To help prevent nausea and vomiting after your treatment, we encourage you to take your nausea medication as directed.  BELOW ARE SYMPTOMS THAT SHOULD BE REPORTED IMMEDIATELY: *FEVER GREATER THAN 100.4 F (38 C) OR HIGHER *CHILLS OR SWEATING *NAUSEA AND VOMITING THAT IS NOT CONTROLLED WITH YOUR NAUSEA MEDICATION *UNUSUAL SHORTNESS OF BREATH *UNUSUAL BRUISING OR BLEEDING *URINARY PROBLEMS (pain or burning when urinating, or frequent urination) *BOWEL PROBLEMS (unusual diarrhea, constipation, pain near the anus) TENDERNESS IN MOUTH AND THROAT WITH OR WITHOUT PRESENCE OF ULCERS (sore throat, sores in mouth, or a toothache) UNUSUAL RASH, SWELLING OR PAIN  UNUSUAL VAGINAL DISCHARGE OR ITCHING   Items with * indicate a potential emergency and should be followed up as soon as possible or go to the Emergency Department if any problems should occur.  Please show the CHEMOTHERAPY ALERT CARD or IMMUNOTHERAPY ALERT CARD at  check-in to the Emergency Department and triage nurse.  Should you have questions after your visit or need to cancel or reschedule your appointment, please contact [facility] [place] [facility] [place]  [phone] and follow the prompts.  Office hours are 8:00 a.m. to 4:30 p.m. Monday - Friday. Please note that voicemails left after 4:00 p.m. may not be returned until the following business day.  We are closed weekends and major holidays. You have access to a nurse at all times for urgent questions. Please call the main number to the clinic [phone] and follow the prompts.  For any non-urgent questions, you may also contact your provider using MyChart. We now offer e-Visits for anyone 46 and older to request care online for non-urgent symptoms. For details visit [email or web address].   Also download the MyChart app! Go to the app store, search "MyChart", open the app, select [facility], and log in with your MyChart username and password.  Masks are optional in the cancer centers. If you would like for your care team to wear a mask while they are taking care of you, please let them know. For doctor visits, patients may have with them one support person who is at least 54 years old. At this time, visitors are not allowed in the infusion area.

## 2021-08-27 NOTE — Progress Notes (Unsigned)
Cardiology Office Note:    Date:  08/30/2021   ID:  Carolyn Roy, DOB [birth year], MRN [id number]  PCP:  [name], NP   [facility] Providers Cardiologist:  None {  Referring MD: [name], NP    History of Present Illness:    Carolyn Roy is a 56 y.o. female with a hx of DMII, HTN, HLD, and breast cancer, and tobacco abuse who was referred by Marnee Guarneri, NP for further evaluation of hypotension.  Patient seen by Marnee Guarneri, NP on 08/25/21 where she reported several years of low blood pressure. Had some associated dizziness. Was recommended to continue water intake, add salt to her diet and compression therapy. She is now referred to Cardiology for further management.  Today, the patient overall feels okay. Patient states that in April, she noted that her blood pressure started to run lower and she has begun to feel lightheaded. Reports no significant changes in 05/2021 with no new medications or illnesses. Has been on amitriptyline since 11/2021 and only takes as needed. Has been on cymbalta for 2-3 years. Lyrica was started in 06/2021 but symptoms preceded the start of this medication. She is not on any antihypertensives. States that she has had multiple episodes of lightheadedness and presyncope over the past month. Denies any episodes of syncope but has had falls after feeling like she has "blacked out for a nanosecond." Notably, symptoms developed after treatment for breast cancer. She also reports ongoing tobacco use and poorly controlled diabetes with significant neuropathy.   She is currently trying to increase her hydration. Reports she does not eat regular meals but this is not new. No chest pain, orthopnea, PND, LE edema, palpitations, SOB.  Orthostatics negative today.  Past Medical History:  Diagnosis Date   Allergy    Breast cancer ([facility])    Cancer ([place]) 06/15/2017   5.1 cm, T3,N1 (clinical): ER/ PR positive, Her 2 neu not  overexpressed, High Ki 67. Neuoadjuvant chemotherapy.    Depression    Diabetes mellitus without complication ([place]) 6010   Edema of left upper extremity    Endometriosis    Family history of breast cancer    Headache    migraines   Hyperlipidemia    Lymphedema of left arm    Ovarian mass    Personal history of chemotherapy    Personal history of radiation therapy    Pneumonia    2018    Past Surgical History:  Procedure Laterality Date   AXILLARY LYMPH NODE BIOPSY Left 07/14/2017   Procedure: INSERTION GEL MARK CLIP LEFT AXILLA;  Surgeon: Robert Bellow, MD;  Location: [facility];  Service: General;  Laterality: Left;   BREAST BIOPSY Left    Dr Orlene Och BREAST METASTATIC CARCINOMA   BREAST LUMPECTOMY Left 01/12/2018   COLONOSCOPY WITH PROPOFOL N/A 12/27/2019   Procedure: COLONOSCOPY WITH PROPOFOL;  Surgeon: Virgel Manifold, MD;  Location: [facility];  Service: Endoscopy;  Laterality: N/A;   OOPHORECTOMY     PARTIAL MASTECTOMY WITH NEEDLE LOCALIZATION Left 01/12/2018   Procedure: PARTIAL MASTECTOMY WITH NEEDLE LOCALIZATION;  Surgeon: Robert Bellow, MD;  Location: [facility];  Service: General;  Laterality: Left;   PORTACATH PLACEMENT Right 07/14/2017   Procedure: INSERTION PORT-A-CATH;  Surgeon: Robert Bellow, MD;  Location: [facility];  Service: General;  Laterality: Right;   SENTINEL NODE BIOPSY Left 01/12/2018   Procedure: SENTINEL NODE BIOPSY;  Surgeon: Robert Bellow, MD;  Location: [facility];  Service: General;  Laterality: Left;   TUBAL LIGATION      Current Medications: Current Meds  Medication Sig   amitriptyline (ELAVIL) 50 MG tablet TAKE 1 TABLET BY MOUTH EVERYDAY AT BEDTIME   aspirin-acetaminophen-caffeine (EXCEDRIN MIGRAINE) 250-250-65 MG tablet Take 2 tablets by mouth daily as needed for headache.    Blood Glucose Monitoring Suppl (ONETOUCH VERIO) w/Device KIT Use to check blood sugar 3 times a day and document results, bring to appointments.   Goal is <130 fasting blood sugar and <180 two hours after meals.   DULoxetine (CYMBALTA) 60 MG capsule Take 60 mg by mouth daily.   famotidine (PEPCID) 20 MG tablet TAKE 1 TABLET BY MOUTH EVERY DAY   glucose blood test strip Use to check blood sugar 3 times daily, fasting in morning with goal <130 and 2 hours after meals with goal <180.  Bring blood sugar log to visits.   goserelin (ZOLADEX) 3.6 MG injection Inject 3.6 mg into the skin every 28 (twenty-eight) days.   Insulin Glargine (BASAGLAR KWIKPEN) 100 UNIT/ML Inject 10 Units into the skin daily.   Insulin Pen Needle (NOVOFINE) 30G X 8 MM MISC Inject 10 each into the skin as needed.   linagliptin (TRADJENTA) 5 MG TABS tablet Take 1 tablet (5 mg total) by mouth daily.   metFORMIN (GLUCOPHAGE-XR) 500 MG 24 hr tablet Start by taking one tablet (500 MG) twice a day with meals and then increase in one week to two tablets (1000 MG) twice a day with meals.   oxyCODONE (OXY IR/ROXICODONE) 5 MG immediate release tablet Take 1 tablet (5 mg total) by mouth 2 (two) times daily as needed for severe pain. Must last 30 days   potassium chloride (KLOR-CON M) 10 MEQ tablet Take 1 tablet (10 mEq total) by mouth daily for 5 days.   pregabalin (LYRICA) 150 MG capsule Take 1 capsule (150 mg total) by mouth 2 (two) times daily.   rosuvastatin (CRESTOR) 40 MG tablet Take 1 tablet (40 mg total) by mouth daily. Stop taking Atorvastatin.   SUMAtriptan (IMITREX) 100 MG tablet Take 1 tablet (100 mg total) by mouth every 2 (two) hours as needed for migraine. May repeat in 2 hours if headache persists or recurs.   tamoxifen (NOLVADEX) 10 MG tablet TAKE 1 TABLET BY MOUTH EVERY DAY   Vitamin D, Ergocalciferol, (DRISDOL) 1.25 MG (50000 UNIT) CAPS capsule TAKE 1 CAPSULE BY MOUTH ONE TIME PER WEEK     Allergies:   Aspirin   Social History   Socioeconomic History   Marital status: Married    Spouse name: Not on file   Number of children: Not on file   Years of education:  Not on file   Highest education level: Not on file  Occupational History   Not on file  Tobacco Use   Smoking status: Every Day    Packs/day: 1.00    Years: 11.00    Total pack years: 11.00    Types: Cigarettes   Smokeless tobacco: Former    Types: Snuff  Vaping Use   Vaping Use: Never used  Substance and Sexual Activity   Alcohol use: No    Alcohol/week: 0.0 standard drinks of alcohol   Drug use: No   Sexual activity: Yes  Other Topics Concern   Not on file  Social History Narrative   Not on file   Social Determinants of Health   Financial Resource Strain: Low Risk  (07/03/2019)   Overall Financial Resource Strain (CARDIA)  Difficulty of Paying Living Expenses: Not very hard  Food Insecurity: Not on file  Transportation Needs: Not on file  Physical Activity: Not on file  Stress: Not on file  Social Connections: Not on file     Family History: The patient's family history includes Breast cancer in an other family member; Breast cancer (age of onset: 64) in her maternal grandmother; Breast cancer (age of onset: 65) in an other family member; Breast cancer (age of onset: 71) in her maternal aunt; Cancer in her mother; Colon cancer in her maternal grandmother and mother; Diabetes in her brother; Other in her father; Pancreatitis in her brother; Prostate cancer (age of onset: 20) in her brother.  ROS:   Please see the history of present illness.     All other systems reviewed and are negative.  EKGs/Labs/Other Studies Reviewed:    The following studies were reviewed today: TTE 02-May-2017: Study Conclusions   - Left ventricle: The cavity size was normal. Systolic function was    normal. The estimated ejection fraction was in the range of 60%    to 65%. Wall motion was normal; there were no regional wall    motion abnormalities. Doppler parameters are consistent with    abnormal left ventricular relaxation (grade 1 diastolic    dysfunction).  - Left atrium: The atrium was  normal in size.  - Right ventricle: Systolic function was normal.  - Pulmonary arteries: Systolic pressure was within the normal    range.   EKG:  EKG is  ordered today.  The ekg ordered today demonstrates NSR, HR 71  Recent Labs: 11/11/2020: TSH 2.760 02/12/2021: Magnesium 2.1 08/27/2021: ALT 14; BUN 16; Creatinine, Ser 0.82; Hemoglobin 12.6; Platelets 177; Potassium 3.1; Sodium 140  Recent Lipid Panel    Component Value Date/Time   CHOL 116 08/25/2021 1417   CHOL WILL FOLLOW 11/15/2016 1452   TRIG 107 08/25/2021 1417   TRIG WILL FOLLOW 11/15/2016 1452   HDL 44 08/25/2021 1417   VLDL WILL FOLLOW 11/15/2016 1452   LDLCALC 52 08/25/2021 1417     Risk Assessment/Calculations:           Physical Exam:    VS:  BP 104/71   Pulse 71   Ht '5\' 4"'  (1.626 m)   Wt 177 lb 12.8 oz (80.6 kg)   LMP  (LMP Unknown) Comment: LAST PERIOD IN MAY 05/02/17 WHEN SHE STARTED CHEMO  SpO2 97%   BMI 30.52 kg/m     Wt Readings from Last 3 Encounters:  08/30/21 177 lb 12.8 oz (80.6 kg)  08/27/21 174 lb 14.4 oz (79.3 kg)  08/25/21 173 lb 6.4 oz (78.7 kg)     GEN:  Well nourished, well developed in no acute distress HEENT: Normal NECK: No JVD; No carotid bruits CARDIAC: RRR, no murmurs, rubs, gallops RESPIRATORY:  Clear to auscultation without rales, wheezing or rhonchi  ABDOMEN: Soft, non-tender, non-distended MUSCULOSKELETAL:  No edema; No deformity  SKIN: Warm and dry NEUROLOGIC:  Alert and oriented x 3 PSYCHIATRIC:  Normal affect   ASSESSMENT:    1. Orthostatic hypotension   2. Encounter for antineoplastic chemotherapy   3. Personal history of breast cancer   4. History of chemotherapy   5. Hyperlipidemia associated with type 2 diabetes mellitus ([place])   6. Type 2 diabetes mellitus with hyperglycemia, with long-term current use of insulin ([facility])   7. 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 She is on several neuropathic meds that can lower blood pressure further but either the medications are not new or were added after the onset of symptoms. Discussed the option of medical management with medications or conservative measures. She would like to start with conservative measures first as detailed below. -Increase hydration including electrolyte replacement drinks -Small frequent meals without skipping meals -Slow position changes -Liberalize salt in diet -Compression sock therapy -Continue management of DMII -Tobacco cessation encouraged -If symptoms do not improve, can consider adding midodrine in the future -Will check TTE with strain as below  #HLD: -Continue crestor 18m daily -LDL controlled at 52 -Follow-up with PCP  #Poorly Controlled DMII: A1C 10.1. Managed by PCP.  -Discussed how glucose control is prudent to improve her symptoms and decrease CV risk  #Tobacco Abuse: -Encourage cessation  #Left breast cancer: ER/PR positive. S/p chemo with adriamycin, cyclophosphamide and taxol as well as XRT treatment. Currently on tamoxifen. -Check TTE with strain           Medication Adjustments/Labs and Tests Ordered: Current medicines are reviewed at length with the patient today.  Concerns regarding medicines are outlined above.  Orders Placed This Encounter  Procedures   EKG 12-Lead   ECHOCARDIOGRAM COMPLETE   No orders of the defined types were placed in this encounter.   Patient Instructions  Medication Instructions:   Your physician recommends that you continue on your current medications as directed. Please refer to the Current Medication list given to you today.  *If you need a refill on your cardiac medications before your next appointment, please call your pharmacy*   Testing/Procedures:  Your physician has requested that you have an  echocardiogram. Echocardiography is a painless test that uses sound waves to create images of your heart. It provides your doctor with information about the size and shape of your heart and how well your heart's chambers and valves are working. This procedure takes approximately one hour. There are no restrictions for this procedure.  DO ECHO WITH STRAIN PER DR. PJohney Frame   Follow-Up:  3 MONTHS WITH AN EXTENDER IN THE OFFICE   --PLEASE KEEP A LOG OF YOUR BLOOD PRESSURES AND SEND A FEW FOR DR. PJohney FrameTO REVIEW VIA MYCHART  Orthostatic Hypotension Blood pressure is a measurement of how strongly, or weakly, your circulating blood is pressing against the walls of your arteries. Orthostatic hypotension is a drop in blood pressure that can happen when you change positions, such as when you go from lying down to standing. Arteries are blood vessels that carry blood from your heart throughout your body. When blood pressure is too low, you may not get enough blood to your brain or to the rest of your organs. Orthostatic hypotension can cause light-headedness, sweating, rapid heartbeat, blurred vision, and fainting. These symptoms require further investigation into the cause. What are the causes? Orthostatic hypotension can be caused by many things, including: Sudden changes in posture, such as standing up quickly after you have been sitting or lying down. Loss of blood (anemia) or loss of body fluids (dehydration). Heart problems, neurologic problems, or hormone problems. Pregnancy. Aging. The risk for this condition increases as you get older. Severe infection (sepsis). Certain medicines, such as medicines for high blood pressure or medicines that make the body lose excess fluids (diuretics). What are the signs or symptoms? Symptoms of this condition may include:  Weakness, light-headedness, or dizziness. Sweating. Blurred vision. Tiredness (fatigue). Rapid heartbeat. Fainting, in severe  cases. How is this diagnosed? This condition is diagnosed based on: Your symptoms and medical history. Your blood pressure measurements. Your health care provider will check your blood pressure when you are: Lying down. Sitting. Standing. A blood pressure reading is recorded as two numbers, such as "120 over 80" (or 120/80). The first ("top") number is called the systolic pressure. It is a measure of the pressure in your arteries as your heart beats. The second ("bottom") number is called the diastolic pressure. It is a measure of the pressure in your arteries when your heart relaxes between beats. Blood pressure is measured in a unit called mmHg. Healthy blood pressure for most adults is 120/80 mmHg. Orthostatic hypotension is defined as a 20 mmHg drop in systolic pressure or a 10 mmHg drop in diastolic pressure within 3 minutes of standing. Other information or tests that may be used to diagnose orthostatic hypotension include: Your other vital signs, such as your heart rate and temperature. Blood tests. An electrocardiogram (ECG) or echocardiogram. A Holter monitor. This is a device you wear that records your heart rhythm continuously, usually for 24-48 hours. Tilt table test. For this test, you will be safely secured to a table that moves you from a lying position to an upright position. Your heart rhythm and blood pressure will be monitored during the test. How is this treated? This condition may be treated by: Changing your diet. This may involve eating more salt (sodium) or drinking more water. Changing the dosage of certain medicines you are taking that might be lowering your blood pressure. Correcting the underlying reason for the orthostatic hypotension. Wearing compression stockings. Taking medicines to raise your blood pressure. Avoiding actions that trigger symptoms. Follow these instructions at home: Medicines Take over-the-counter and prescription medicines only as told by  your health care provider. Follow instructions from your health care provider about changing the dosage of your current medicines, if this applies. Do not stop or adjust any of your medicines on your own. Eating and drinking Illustration of a person drinking a glass of water.   Drink enough fluid to keep your urine pale yellow. Eat extra salt only as directed. Do not add extra salt to your diet unless advised by your health care provider. Eat frequent, small meals. Avoid standing up suddenly after eating. General instructions Compression stockings on a person's lower legs.   Get up slowly from lying down or sitting positions. This gives your blood pressure a chance to adjust. Avoid hot showers and excessive heat as directed by your health care provider. Engage in regular physical activity as directed by your health care provider. If you have compression stockings, wear them as told. Keep all follow-up visits. This is important. Contact a health care provider if: You have a fever for more than 2-3 days. You feel more thirsty than usual. You feel dizzy or weak. Get help right away if: You have chest pain. You have a fast or irregular heartbeat. You become sweaty or feel light-headed. You feel short of breath. You faint. You have any symptoms of a stroke. "BE FAST" is an easy way to remember the main warning signs of a stroke: B - Balance. Signs are dizziness, sudden trouble walking, or loss of balance. E - Eyes. Signs are trouble seeing or a sudden change in vision. F - Face. Signs are sudden weakness or numbness of the face, or the face  or eyelid drooping on one side. A - Arms. Signs are weakness or numbness in an arm. This happens suddenly and usually on one side of the body. S - Speech. Signs are sudden trouble speaking, slurred speech, or trouble understanding what people say. T - Time. Time to call emergency services. Write down what time symptoms started. You have other signs  of a stroke, such as: A sudden, severe headache with no known cause. Nausea or vomiting. Seizure. These symptoms may represent a serious problem that is an emergency. Do not wait to see if the symptoms will go away. Get medical help right away. Call your local emergency services (911 in the [place] Do not drive yourself to the hospital. Summary Orthostatic hypotension is a sudden drop in blood pressure. It can cause light-headedness, sweating, rapid heartbeat, blurred vision, and fainting. Orthostatic hypotension can be diagnosed by having your blood pressure taken while lying down, sitting, and then standing. Treatment may involve changing your diet, wearing compression stockings, sitting up slowly, adjusting your medicines, or correcting the underlying reason for the orthostatic hypotension. Get help right away if you have chest pain, a fast or irregular heartbeat, or symptoms of a stroke. This information is not intended to replace advice given to you by your health care provider. Make sure you discuss any questions you have with your health care provider. Document Revised: 04/09/2020 Document Reviewed: 04/09/2020 Elsevier Patient Education  [place], Freada Bergeron, MD  08/30/2021 12:48 PM    [place]

## 2021-08-27 NOTE — Progress Notes (Addendum)
[place] CONSULT NOTE  Patient Care Team: [name], NP as PCP - General (Nurse Practitioner) Bary Castilla [name], MD (General Surgery) [name], [name], [name] (Obstetrics and Gynecology) Cammie Sickle, MD as Consulting Physician (Oncology) Lovell Sheehan, MD as Consulting Physician (Orthopedic Surgery)  CHIEF COMPLAINTS/PURPOSE OF CONSULTATION: Breast cancer  Oncology History Overview Note  # MAY 2019-  clinical stage IIIA (T3N1Mx) left breast cancer s/p biopsy on 06/14/2017. -Pathology revealed grade III invasive ductal carcinoma. -Axillary FNA revealed malignant cells c/w metastatic carcinoma. Tumor was ER + (90%), PR + (30%), Her2/neu - and Ki67 70%.  CA27.29 was 7.8 on 06/14/2017.  # She received 4 cycles of AC with Neulasta support (07/20/2017 - 08/31/2017).;  neoadjuvant Taxol on 09/14/2017.  #DEC 2019- Lumpectomy/sentinel lymph node biopsy [Dr.[name]]-complete pathologic response  # s/p RT [delayed sec to wound infection; Dr.[name]] finished RT [4/12]  # April 14th 2020- START TAM; stopped in mid-May secondary intolerance [severe migraines].  # 18th May 2020-start Arimidex [hormonal profile-postmenopausal;add Zoladex q3M]; STOPPED sec intolerance/joint pain; NOV 2021- STARTED AROMASIN; Stopped x 2 months sec to extreme fatigue/severe joint pains.   # July 2022- START tamoxifen 10 mg a day.  # PN-2 sec to taxol Carolyn Roy management/ # may 2019- Endometrial sampling [Dr. Secord/Berchuck]-negative for malignancy/ # DM-2- poorly controlled.   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   # PAP SMEAR- RECOMMENDED 2022- summer  -------------------------------------------  DIAGNOSIS: left breast cancer  STAGE:  III       ;GOALS: cure  CURRENT/MOST RECENT THERAPY Tam    Cancer of midline of breast, left ([facility]CC) (Resolved)  06/15/2017  Initial Diagnosis   Cancer of midline of breast, left ([place])   07/20/2017 - 11/16/2017 Chemotherapy   The patient had dexamethasone (DECADRON) 4 MG tablet, 1 of 1 cycle, Start date: --, End date: -- DOXOrubicin (ADRIAMYCIN) chemo injection 122 mg, 60 mg/m2 = 122 mg, Intravenous,  Once, 4 of 4 cycles Administration: 122 mg (07/20/2017), 122 mg (08/03/2017), 122 mg (08/17/2017), 122 mg (08/31/2017) palonosetron (ALOXI) injection 0.25 mg, 0.25 mg, Intravenous,  Once, 4 of 4 cycles Administration: 0.25 mg (07/20/2017), 0.25 mg (08/03/2017), 0.25 mg (08/17/2017), 0.25 mg (08/31/2017) pegfilgrastim (NEULASTA) injection 6 mg, 6 mg, Subcutaneous, Once, 5 of 5 cycles Administration: 6 mg (07/21/2017), 6 mg (08/04/2017), 6 mg (08/18/2017), 6 mg (09/01/2017) cyclophosphamide (CYTOXAN) 1,220 mg in sodium chloride 0.9 % 250 mL chemo infusion, 600 mg/m2 = 1,220 mg, Intravenous,  Once, 4 of 4 cycles Administration: 1,220 mg (07/20/2017), 1,220 mg (08/03/2017), 1,220 mg (08/17/2017), 1,220 mg (08/31/2017) PACLitaxel (TAXOL) 162 mg in sodium chloride 0.9 % 250 mL chemo infusion (</= 42m/m2), 80 mg/m2 = 162 mg, Intravenous,  Once, 10 of 12 cycles Dose modification: 65 mg/m2 (original dose 80 mg/m2, Cycle 13, Reason: Provider Judgment, Comment: neuropathy) Administration: 162 mg (09/14/2017), 162 mg (09/21/2017), 162 mg (09/28/2017), 162 mg (10/05/2017), 162 mg (10/12/2017), 162 mg (10/19/2017), 162 mg (10/26/2017), 132 mg (11/02/2017), 132 mg (11/09/2017), 132 mg (11/16/2017) fosaprepitant (EMEND) 150 mg, dexamethasone (DECADRON) 12 mg in sodium chloride 0.9 % 145 mL IVPB, , Intravenous,  Once, 4 of 4 cycles Administration:  (07/20/2017),  (08/03/2017),  (08/17/2017),  (08/31/2017)  for chemotherapy treatment.    Malignant neoplasm of lower-outer quadrant of left breast of female, estrogen receptor positive ([facility]  11/08/2017 Initial Diagnosis   Malignant neoplasm of lower-outer quadrant of left  breast of female, estrogen receptor positive  ([place])    HISTORY OF PRESENTING ILLNESS:  [name] is a 56 year old female with past medical history of stage III ER/PR positive HER2/neu negative breast cancer who is currently on tamoxifen 10 mg daily due to intolerance of full dose plus Zoladex every 3 months and Zometa every 6 months.  Denies any new concerns since her last visit.  Appetite has been good.  No weight loss.  Currently on amitriptyline per neurology for migraines.   Review of Systems  Constitutional: Negative.  Negative for chills, fever, malaise/fatigue and weight loss.  HENT:  Negative for congestion, ear pain and tinnitus.   Eyes: Negative.  Negative for blurred vision and double vision.  Respiratory: Negative.  Negative for cough, sputum production and shortness of breath.   Cardiovascular: Negative.  Negative for chest pain, palpitations and leg swelling.  Gastrointestinal: Negative.  Negative for abdominal pain, constipation, diarrhea, nausea and vomiting.  Genitourinary:  Negative for dysuria, frequency and urgency.  Musculoskeletal:  Negative for back pain and falls.  Skin: Negative.  Negative for rash.  Neurological: Negative.  Negative for weakness and headaches.  Endo/Heme/Allergies: Negative.  Does not bruise/bleed easily.  Psychiatric/Behavioral: Negative.  Negative for depression. The patient is not nervous/anxious and does not have insomnia.      MEDICAL HISTORY:  Past Medical History:  Diagnosis Date   Allergy    Breast cancer ([place])    Cancer ([place]) 06/15/2017   5.1 cm, T3,N1 (clinical): ER/ PR positive, Her 2 neu not overexpressed, High Ki 67. Neuoadjuvant chemotherapy.    Depression    Diabetes mellitus without complication ([place]) 7829   Edema of left upper extremity    Endometriosis    Family history of breast cancer    Headache    migraines   Hyperlipidemia    Lymphedema of left arm    Ovarian mass    Personal history of chemotherapy    Personal history of radiation therapy    Pneumonia     2018    SURGICAL HISTORY: Past Surgical History:  Procedure Laterality Date   AXILLARY LYMPH NODE BIOPSY Left 07/14/2017   Procedure: INSERTION GEL MARK CLIP LEFT AXILLA;  Surgeon: Robert Bellow, MD;  Location: [facility] OR[facility];  Service: General;  Laterality: Left;   BREAST BIOPSY Left    Dr Orlene Och BREAST METASTATIC CARCINOMA   BREAST LUMPECTOMY Left 01/12/2018   COLONOSCOPY WITH PROPOFOL N/A 12/27/2019   Procedure: COLONOSCOPY WITH PROPOFOL;  Surgeon: Virgel Manifold, MD;  Location: [facility];  Service: Endoscopy;  Laterality: N/A;   OOPHORECTOMY     PARTIAL MASTECTOMY WITH NEEDLE LOCALIZATION Left 01/12/2018   Procedure: PARTIAL MASTECTOMY WITH NEEDLE LOCALIZATION;  Surgeon: Robert Bellow, MD;  Location: [facility] OR[facility];  Service: General;  Laterality: Left;   PORTACATH PLACEMENT Right 07/14/2017   Procedure: INSERTION PORT-A-CATH;  Surgeon: Robert Bellow, MD;  Location: [facility] OR[facility];  Service: General;  Laterality: Right;   SENTINEL NODE BIOPSY Left 01/12/2018   Procedure: SENTINEL NODE BIOPSY;  Surgeon: Robert Bellow, MD;  Location: [facility];  Service: General;  Laterality: Left;   TUBAL LIGATION      SOCIAL HISTORY: Social History   Socioeconomic History   Marital status: Married    Spouse name: Not on file   Number of children: Not on file   Years of education: Not on file   Highest education level: Not on file  Occupational History   Not on file  Tobacco  Use   Smoking status: Every Day    Packs/day: 1.00    Years: 11.00    Total pack years: 11.00    Types: Cigarettes   Smokeless tobacco: Former    Types: Snuff  Vaping Use   Vaping Use: Never used  Substance and Sexual Activity   Alcohol use: No    Alcohol/week: 0.0 standard drinks of alcohol   Drug use: No   Sexual activity: Yes  Other Topics Concern   Not on file  Social History Narrative   Not on file   Social Determinants of Health   Financial Resource Strain: Low Risk  (07/03/2019)    Overall Financial Resource Strain (CARDIA)    Difficulty of Paying Living Expenses: Not very hard  Food Insecurity: Not on file  Transportation Needs: Not on file  Physical Activity: Not on file  Stress: Not on file  Social Connections: Not on file  Intimate Partner Violence: Not on file    FAMILY HISTORY: Family History  Problem Relation Age of Onset   Colon cancer Mother    Cancer Mother    Other Father        family hx on dad's side: breast, colon, stomach cancer-biological dad   Diabetes Brother    Pancreatitis Brother    Prostate cancer Brother 4       currently 51 / maternal half-brother   Breast cancer Maternal Aunt 52       currently 45   Breast cancer Maternal Grandmother 16       deceased 18s   Colon cancer Maternal Grandmother    Breast cancer Other 13       mother's sister; deceased 53   Breast cancer Other        mother's sister; age at dx unknown    ALLERGIES:  is allergic to aspirin.  MEDICATIONS:  Current Outpatient Medications  Medication Sig Dispense Refill   amitriptyline (ELAVIL) 50 MG tablet TAKE 1 TABLET BY MOUTH EVERYDAY AT BEDTIME 90 tablet 2   aspirin-acetaminophen-caffeine (EXCEDRIN MIGRAINE) 250-250-65 MG tablet Take 2 tablets by mouth daily as needed for headache.      Blood Glucose Monitoring Suppl (ONETOUCH VERIO) w/Device KIT Use to check blood sugar 3 times a day and document results, bring to appointments.  Goal is <130 fasting blood sugar and <180 two hours after meals. 1 kit 0   DULoxetine (CYMBALTA) 60 MG capsule Take 60 mg by mouth daily.     famotidine (PEPCID) 20 MG tablet TAKE 1 TABLET BY MOUTH EVERY DAY 90 tablet 2   glucose blood test strip Use to check blood sugar 3 times daily, fasting in morning with goal <130 and 2 hours after meals with goal <180.  Bring blood sugar log to visits. 100 each 12   goserelin (ZOLADEX) 3.6 MG injection Inject 3.6 mg into the skin every 28 (twenty-eight) days.     Insulin Glargine (BASAGLAR  KWIKPEN) 100 UNIT/ML Inject 10 Units into the skin daily. 15 mL 4   Insulin Pen Needle (NOVOFINE) 30G X 8 MM MISC Inject 10 each into the skin as needed. 100 each 4   linagliptin (TRADJENTA) 5 MG TABS tablet Take 1 tablet (5 mg total) by mouth daily. 45 tablet 4   metFORMIN (GLUCOPHAGE-XR) 500 MG 24 hr tablet Start by taking one tablet (500 MG) twice a day with meals and then increase in one week to two tablets (1000 MG) twice a day with meals. 360 tablet 4  oxyCODONE (OXY IR/ROXICODONE) 5 MG immediate release tablet Take 1 tablet (5 mg total) by mouth 2 (two) times daily as needed for severe pain. Must last 30 days 60 tablet 0   oxyCODONE (OXY IR/ROXICODONE) 5 MG immediate release tablet Take 1 tablet (5 mg total) by mouth 2 (two) times daily as needed for severe pain. Must last 30 days 60 tablet 0   oxyCODONE (OXY IR/ROXICODONE) 5 MG immediate release tablet Take 1 tablet (5 mg total) by mouth 2 (two) times daily as needed for severe pain. Must last 30 days 60 tablet 0   potassium chloride (KLOR-CON M) 10 MEQ tablet Take 1 tablet (10 mEq total) by mouth daily for 5 days. 5 tablet 0   pregabalin (LYRICA) 150 MG capsule Take 1 capsule (150 mg total) by mouth 2 (two) times daily. 60 capsule 2   rosuvastatin (CRESTOR) 40 MG tablet Take 1 tablet (40 mg total) by mouth daily. Stop taking Atorvastatin. 90 tablet 4   SUMAtriptan (IMITREX) 100 MG tablet Take 1 tablet (100 mg total) by mouth every 2 (two) hours as needed for migraine. May repeat in 2 hours if headache persists or recurs. 10 tablet 0   tamoxifen (NOLVADEX) 10 MG tablet TAKE 1 TABLET BY MOUTH EVERY DAY 90 tablet 1   Vitamin D, Ergocalciferol, (DRISDOL) 1.25 MG (50000 UNIT) CAPS capsule TAKE 1 CAPSULE BY MOUTH ONE TIME PER WEEK 12 capsule 1   No current facility-administered medications for this visit.   Facility-Administered Medications Ordered in Other Visits  Medication Dose Route Frequency Provider Last Rate Last Admin   heparin lock  flush 100 unit/mL  500 Units Intravenous Once Corcoran, Melissa C, MD       heparin lock flush 100 unit/mL  500 Units Intravenous Once Charlaine Dalton R, MD       sodium chloride flush (NS) 0.9 % injection 10 mL  10 mL Intravenous Once Corcoran, Melissa C, MD       sodium chloride flush (NS) 0.9 % injection 10 mL  10 mL Intravenous Once Cammie Sickle, MD          .  PHYSICAL EXAMINATION: ECOG PERFORMANCE STATUS: 0 - Asymptomatic  There were no vitals filed for this visit.  There were no vitals filed for this visit.   Physical Exam Constitutional:      Appearance: Normal appearance.  HENT:     Head: Normocephalic and atraumatic.  Eyes:     Pupils: Pupils are equal, round, and reactive to light.  Cardiovascular:     Rate and Rhythm: Normal rate and regular rhythm.     Heart sounds: Normal heart sounds. No murmur heard. Pulmonary:     Effort: Pulmonary effort is normal.     Breath sounds: Normal breath sounds. No wheezing.  Abdominal:     General: Bowel sounds are normal. There is no distension.     Palpations: Abdomen is soft.     Tenderness: There is no abdominal tenderness.  Musculoskeletal:        General: Normal range of motion.     Cervical back: Normal range of motion.  Skin:    General: Skin is warm and dry.     Findings: No rash.  Neurological:     Mental Status: She is alert and oriented to person, place, and time.     Gait: Gait is intact.  Psychiatric:        Mood and Affect: Mood and affect normal.  Cognition and Memory: Memory normal.        Judgment: Judgment normal.      LABORATORY DATA:  I have reviewed the data as listed Lab Results  Component Value Date   WBC 6.9 05/25/2021   HGB 13.0 05/25/2021   HCT 39.4 05/25/2021   MCV 96.6 05/25/2021   PLT 195 05/25/2021   Recent Labs    12/01/20 1834 12/21/20 1509 02/22/21 1259 05/14/21 1145 05/25/21 1320 08/25/21 1417  NA 139   < > 135 136 134* 142  K 4.1   < > 3.3* 3.9 3.8  3.1*  CL 105   < > 99 98 101 102  CO2 27   < > $R'26 27 24 25  'IZ$ GLUCOSE 264*   < > 151* 333* 293* 128*  BUN 12   < > $R'10 10 10 16  'SV$ CREATININE 0.59   < > 0.53 0.60 0.62 0.90  CALCIUM 9.3   < > 9.3 9.1 8.8* 9.1  GFRNONAA >60  --  >60  --  >60  --   PROT 7.5   < > 7.0 6.2 6.4*  --   ALBUMIN 4.1   < > 4.2 4.1 3.6  --   AST 17   < > $R'15 13 18  'GC$ --   ALT 17   < > $R'17 16 15  'dU$ --   ALKPHOS 60   < > 61 97 63  --   BILITOT 0.7   < > 0.7 0.4 0.4  --    < > = values in this interval not displayed.     RADIOGRAPHIC STUDIES: I have personally reviewed the radiological images as listed and agreed with the findings in the report. No results found.  ASSESSMENT & PLAN:  Left breast cancer stage III ER/PR positive HER2/neu negative- Clinically she is doing well without evidence of recurrence.  Had breast exam completed about 3 months ago with Dr. Rogue Bussing and is due for a mammogram next month.  She is feeling well.  Denies any new lumps or bumps.  Has been tolerating tamoxifen 10 mg daily.  Reviewed labs with patient which are acceptable for both Zometa and Zoladex injections (corrected calcium to 8.9).  She will return to clinic in 3 months for follow-up with Dr. Rogue Bussing with lab work and Zoladex injection.  Migraines-currently on amitriptyline which seems to be helping.   Lymphedema-appears to be improved with physical therapy.  No new concerns.  Diabetes type 2-currently on insulin.  Disposition- Proceed with Zoladex and Zometa injection.  Return to clinic in 3 months for follow-up with lab work, see Dr. Rogue Bussing and possible Zoladex injection.    I spent 25 minutes dedicated to the care of this patient (face-to-face and non-face-to-face) on the date of the encounter to include what is described in the assessment and plan.   No problem-specific Assessment & Plan notes found for this encounter.  All questions were answered. The patient knows to call the clinic with any problems, questions or  concerns.    Jacquelin Hawking, NP 08/27/2021 1:06 PM

## 2021-08-28 LAB — CANCER ANTIGEN 27.29: CA 27.29: 13.5 U/mL (ref 0.0–38.6)

## 2021-08-29 NOTE — Progress Notes (Signed)
PROVIDER NOTE: Information contained herein reflects review and annotations entered in association with encounter. Interpretation of such information and data should be left to medically-trained personnel. Information provided to patient can be located elsewhere in the medical record under "Patient Instructions". Document created using STT-dictation technology, any transcriptional errors that may result from process are unintentional.    Patient: Carolyn Roy  Service Category: E/M  Provider: Gaspar Cola, MD  DOB: 06-11-1965  DOS: 08/30/2021  Specialty: Interventional Pain Management  MRN: [id number]  Setting: Ambulatory outpatient  PCP: [name], NP  Type: Established Patient    Referring Provider: [name], NP  Location: Office  Delivery: Face-to-face     HPI  [name], a 56 y.o. year old female, is here today because of her Chronic pain syndrome [G89.4]. [name] primary complain today is No chief complaint on file. Last encounter: My last encounter with her was on 07/14/2021. Pertinent problems: [name] has Chronic fatigue; Family history of breast cancer; Chemotherapy-induced neuropathy ([place]); Malignant neoplasm of lower-outer quadrant of left breast of female, estrogen receptor positive ([place]); Chronic feet pain (1ry area of Pain) (Bilateral) (R>L); Neuropathic pain of feet (Bilateral); Chronic knee pain (2ry area of Pain) (Bilateral) (R>L); Chronic hand pain (3ry area of Pain) (Bilateral) (R>L); Chronic pain syndrome; Chronic hip pain (4th area of Pain) (Bilateral) (R>L); Chronic upper extremity pain (3ry area of Pain) (Bilateral) (R>L); Cancer-related pain; Neuropathic pain; Osteoarthritis of knee (Right); Chronic low back pain (Bilateral (L>R) w/o sciatica; Chronic hip pain (Left); Lumbar facet syndrome (Bilateral) (L>R); Idiopathic scoliosis; Chronic sacroiliac joint pain (Left); Chronic pain of knee (Right); Abnormal MRI, lumbar spine (04/20/2018);  Lumbar facet arthropathy; Spondylosis without myelopathy or radiculopathy, lumbosacral region; DDD (degenerative disc disease), lumbosacral; Numbness and tingling of upper extremity (C6/C7 dermatomes) (Right); Personal history of breast cancer; Cervicalgia; Cervical radiculitis (Right); Effusion of knee joint (Right); Tricompartment osteoarthritis of knee (Right); 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 Pain Assessment: Severity of Chronic pain is reported as a 2 /10. Location: Knee Right/Denies. Onset: More than a month ago. Quality: Numbness, Aching. Timing: Constant. Modifying factor(s): meds nand sitting down. Vitals:  height is '5\' 4"'  (1.626 m) and weight is 177 lb (80.3 kg). Her temperature is 97.9 F (36.6 C). Her blood pressure is 94/65 and her pulse is 78. Her oxygen saturation is 100%.   Reason for encounter: medication management.  The patient indicates doing well with the current medication regimen. No adverse reactions or side effects reported to the medications.   UDS ordered today.   RTCB: 12/01/2021 Nonopioids transfer 02/03/2020: Pregabalin (Lyrica) and alpha-lipoic acid  Pharmacotherapy Assessment  Analgesic: Oxycodone IR 5 mg, 1 tab PO BID (10 mg/day of oxycodone) (Average: 2 tabs/day = 10 mg/day of oxycodone) MME/day: 15 mg/day.   Monitoring: [place] PMP: PDMP reviewed during this encounter.       Pharmacotherapy: No side-effects or adverse reactions reported. Compliance: No problems identified. Effectiveness: Clinically acceptable.  Chauncey Fischer, RN  08/30/2021  2:04 PM  Sign when Signing Visit Nursing Pain Medication Assessment:  Safety precautions to be maintained throughout the outpatient stay will include: orient to surroundings, keep bed in low position, maintain call bell within reach at all times, provide assistance with transfer  out of bed and ambulation.  Medication Inspection Compliance: Pill count conducted under aseptic conditions, in  front of the patient. Neither the pills nor the bottle was removed from the patient's sight at any time. Once count was completed pills were immediately returned to the patient in their original bottle.  Medication: See above Pill/Patch Count:  31 of 60 pills remain Pill/Patch Appearance: Markings consistent with prescribed medication Bottle Appearance: Standard pharmacy container. Clearly labeled. Filled Date: 7 / 6 / 2023 Last Medication intake:  TodaySafety precautions to be maintained throughout the outpatient stay will include: orient to surroundings, keep bed in low position, maintain call bell within reach at all times, provide assistance with transfer out of bed and ambulation.     UDS:  Summary  Date Value Ref Range Status  07/27/2020 Note  Final    Comment:    ==================================================================== ToxASSURE Select 13 (MW) ==================================================================== Test                             Result       Flag       Units  Drug Present and Declared for Prescription Verification   Oxycodone                      491          EXPECTED   ng/mg creat   Oxymorphone                    228          EXPECTED   ng/mg creat   Noroxycodone                   1816         EXPECTED   ng/mg creat   Noroxymorphone                 96           EXPECTED   ng/mg creat    Sources of oxycodone are scheduled prescription medications.    Oxymorphone, noroxycodone, and noroxymorphone are expected    metabolites of oxycodone. Oxymorphone is also available as a    scheduled prescription medication.  ==================================================================== Test                      Result    Flag   Units      Ref Range   Creatinine              57               mg/dL       >=20 ==================================================================== Declared Medications:  The flagging and interpretation on this report are based on the  following declared medications.  Unexpected results may arise from  inaccuracies in the declared medications.   **Note: The testing scope of this panel includes these medications:   Oxycodone (Roxicodone)   **Note: The testing scope of this panel does not include the  following reported medications:   Acetaminophen (Excedrin)  Alpha Lipoic Acid (Alpha Lipoic Acid)  Aspirin (Excedrin)  Atorvastatin (Lipitor)  Caffeine (Excedrin)  Dulaglutide (Trulicity)  Duloxetine (Cymbalta)  Empagliflozin (Jardiance)  Goserelin  Insulin (Basaglar)  Pregabalin (Lyrica)  Sertraline (Zoloft)  Tamoxifen (Nolvadex)  Trazodone (Desyrel)  Vitamin D2 (Drisdol) ==================================================================== For clinical consultation, please call [phone]. ====================================================================      ROS  Constitutional: Denies any fever or chills Gastrointestinal: No reported hemesis, hematochezia, vomiting, or acute GI distress Musculoskeletal: Denies any  acute onset joint swelling, redness, loss of ROM, or weakness Neurological: No reported episodes of acute onset apraxia, aphasia, dysarthria, agnosia, amnesia, paralysis, loss of coordination, or loss of consciousness  Medication Review  Basaglar KwikPen, DULoxetine, Insulin Pen Needle, OneTouch Verio, SUMAtriptan, Vitamin D (Ergocalciferol), amitriptyline, aspirin-acetaminophen-caffeine, famotidine, glucose blood, goserelin, linagliptin, metFORMIN, oxyCODONE, potassium chloride, pregabalin, rosuvastatin, and tamoxifen  History Review  Allergy: [name] is allergic to aspirin. Drug: [name]  reports no history of drug use. Alcohol:  reports no history of alcohol use. Tobacco:  reports that she has been smoking cigarettes.  She has a 11.00 pack-year smoking history. She has quit using smokeless tobacco.  Her smokeless tobacco use included snuff. Social: [name]  reports that she has been smoking cigarettes. She has a 11.00 pack-year smoking history. She has quit using smokeless tobacco.  Her smokeless tobacco use included snuff. She reports that she does not drink alcohol and does not use drugs. Medical:  has a past medical history of Allergy, Breast cancer ([place]), Cancer ([place]) (06/15/2017), Depression, Diabetes mellitus without complication ([place]) (1025), Edema of left upper extremity, Endometriosis, Family history of breast cancer, Headache, Hyperlipidemia, Lymphedema of left arm, Ovarian mass, Personal history of chemotherapy, Personal history of radiation therapy, and Pneumonia. Surgical: [name]  has a past surgical history that includes Oophorectomy; Tubal ligation; Portacath placement (Right, 07/14/2017); Axillary lymph node biopsy (Left, 07/14/2017); Breast lumpectomy (Left, 01/12/2018); Breast biopsy (Left); Partial mastectomy with needle localization (Left, 01/12/2018); Sentinel node biopsy (Left, 01/12/2018); and Colonoscopy with propofol (N/A, 12/27/2019). Family: family history includes Breast cancer in an other family member; Breast cancer (age of onset: 63) in her maternal grandmother; Breast cancer (age of onset: 48) in an other family member; Breast cancer (age of onset: 37) in her maternal aunt; Cancer in her mother; Colon cancer in her maternal grandmother and mother; Diabetes in her brother; Other in her father; Pancreatitis in her brother; Prostate cancer (age of onset: 38) in her brother.  Laboratory Chemistry Profile   Renal Lab Results  Component Value Date   BUN 16 08/27/2021   CREATININE 0.82 08/27/2021   BCR 18 08/25/2021   GFRAA >60 10/21/2019   GFRNONAA >60 08/27/2021    Hepatic Lab Results  Component Value Date   AST 21 08/27/2021   ALT 14 08/27/2021   ALBUMIN 3.5 08/27/2021   ALKPHOS 52  08/27/2021   LIPASE 31 12/01/2020    Electrolytes Lab Results  Component Value Date   NA 140 08/27/2021   K 3.1 (L) 08/27/2021   CL 106 08/27/2021   CALCIUM 8.5 (L) 08/27/2021   MG 2.1 02/12/2021    Bone Lab Results  Component Value Date   VD25OH 45.4 11/11/2020    Inflammation (CRP: Acute Phase) (ESR: Chronic Phase) Lab Results  Component Value Date   CRP <1 12/21/2020   ESRSEDRATE 3 12/21/2020         Note: Above Lab results reviewed.  Recent Imaging Review  MR BRAIN W WO CONTRAST CLINICAL DATA:  56 year old female with a history of breast cancer and headaches, with nonspecific dural thickening and enhancement over the right frontoparietal convexity on November MRI.  EXAM: MRI HEAD WITHOUT AND WITH CONTRAST  TECHNIQUE: Multiplanar, multiecho pulse sequences of the brain and surrounding structures were obtained without and with intravenous contrast.  CONTRAST:  33m GADAVIST GADOBUTROL 1 MMOL/ML IV SOLN  COMPARISON:  01/06/2021 brain MRI.  FINDINGS: Brain: Cerebral volume remains normal. No restricted diffusion to suggest acute infarction. No  midline shift, mass effect, evidence of mass lesion, ventriculomegaly, extra-axial collection or acute intracranial hemorrhage. Cervicomedullary junction and pituitary are within normal limits.  Vertex arachnoid granulations on series 17, image 12 (physiologic). On postcontrast imaging today there is no convincing asymmetric or abnormal dural thickening or enhancement (compare series 19, image 14 today to the same series and image in November). No abnormal enhancement identified. No cerebral edema, and gray and white matter signal is stable and within normal limits. No chronic cerebral blood products identified on SWI.  Vascular: Major intracranial vascular flow voids are stable, dominant distal right vertebral artery. The major dural venous sinuses are enhancing and appear to be patent.  Skull and upper cervical  spine: Bone marrow signal appears stable with no destructive or suspicious skull or osseous lesion identified. Negative visible cervical spine and spinal cord.  Sinuses/Orbits: Stable and negative.  Other: Mastoids remain clear. Visible internal auditory structures appear normal. Negative visible scalp and face.  IMPRESSION: Resolved nonspecific right hemisphere dural thickening and enhancement seen in November.  Normal MRI appearance of the brain now. No metastatic disease or acute intracranial abnormality.  Electronically Signed   By: Genevie Ann M.D.   On: 05/20/2021 09:05 Note: Reviewed        Physical Exam  General appearance: Well nourished, well developed, and well hydrated. In no apparent acute distress Mental status: Alert, oriented x 3 (person, place, & time)       Respiratory: No evidence of acute respiratory distress Eyes: PERLA Vitals: BP 94/65   Pulse 78   Temp 97.9 F (36.6 C)   Ht '5\' 4"'  (8.757 m)   Wt 177 lb (80.3 kg)   LMP  (LMP Unknown) Comment: LAST PERIOD IN MAY 2019 WHEN SHE STARTED CHEMO  SpO2 100%   BMI 30.38 kg/m  BMI: Estimated body mass index is 30.38 kg/m as calculated from the following:   Height as of this encounter: '5\' 4"'  (1.626 m).   Weight as of this encounter: 177 lb (80.3 kg). Ideal: Ideal body weight: 54.7 kg (120 lb 9.5 oz) Adjusted ideal body weight: 64.9 kg (143 lb 2.5 oz)  Assessment   Diagnosis Status  1. Chronic pain syndrome   2. Cancer-related pain   3. Chronic feet pain (1ry area of Pain) (Bilateral) (R>L)   4. Chronic hand pain (3ry area of Pain) (Bilateral) (R>L)   5. Chronic hip pain (4th area of Pain) (Bilateral) (R>L)   6. Chronic knee pain (2ry area of Pain) (Bilateral) (R>L)   7. Cervicalgia   8. Chronic low back pain (Bilateral (L>R) w/o sciatica   9. Abnormal MRI, lumbar spine (04/20/2018)   10. 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 Arthropathy of spinal facet joint concurrent with and  due to effusion (L3-4, L4-5)   12. DDD (degenerative disc disease), lumbosacral   13. Lumbar facet syndrome (Bilateral) (L>R)   14. Pharmacologic therapy   15. Chronic use of opiate for therapeutic purpose   16. Encounter for medication management   17. Encounter for chronic pain management    Controlled Controlled Controlled   Updated Problems: No problems updated.  Plan of Care  Problem-specific:  No problem-specific Assessment & Plan notes found for this encounter.  [name] has a current medication list which includes the following long-term medication(s): amitriptyline, famotidine, basaglar kwikpen, linagliptin, metformin, [START ON 09/02/2021] oxycodone, [START ON 10/02/2021] oxycodone, [START ON 11/01/2021] oxycodone, potassium chloride, potassium chloride, pregabalin, rosuvastatin, and  sumatriptan.  Pharmacotherapy (Medications Ordered): Meds ordered this encounter  Medications   oxyCODONE (OXY IR/ROXICODONE) 5 MG immediate release tablet    Sig: Take 1 tablet (5 mg total) by mouth 2 (two) times daily as needed for severe pain. Must last 30 days    Dispense:  60 tablet    Refill:  0    DO NOT: delete (not duplicate); no partial-fill (will deny script to complete), no refill request (F/U required). DISPENSE: 1 day early if closed on fill date. WARN: No CNS-depressants within 8 hrs of med.   oxyCODONE (OXY IR/ROXICODONE) 5 MG immediate release tablet    Sig: Take 1 tablet (5 mg total) by mouth 2 (two) times daily as needed for severe pain. Must last 30 days    Dispense:  60 tablet    Refill:  0    DO NOT: delete (not duplicate); no partial-fill (will deny script to complete), no refill request (F/U required). DISPENSE: 1 day early if closed on fill date. WARN: No CNS-depressants within 8 hrs of med.   oxyCODONE (OXY IR/ROXICODONE) 5 MG immediate release tablet    Sig: Take 1 tablet (5 mg total) by mouth 2 (two) times daily as needed for severe pain. Must last 30 days     Dispense:  60 tablet    Refill:  0    DO NOT: delete (not duplicate); no partial-fill (will deny script to complete), no refill request (F/U required). DISPENSE: 1 day early if closed on fill date. WARN: No CNS-depressants within 8 hrs of med.   Orders:  Orders Placed This Encounter  Procedures   ToxASSURE Select 13 (MW), Urine    Volume: 30 ml(s). Minimum 3 ml of urine is needed. Document temperature of fresh sample. Indications: Long term (current) use of opiate analgesic (Z61.096)    Order Specific Question:   Release to patient    Answer:   Immediate   Follow-up plan:   Return in about 3 months (around 12/01/2021) for Eval-day (M,W), (F2F), (MM).     Interventional Therapies  Risk  Complexity Considerations:   Estimated body mass index is 30.55 kg/m as calculated from the following:   Height as of this encounter: '5\' 4"'  (1.626 m).   Weight as of this encounter: 178 lb (80.7 kg). WNL   Planned  Pending:      Under consideration:   Possible bilateral lumbar facet RFA  Diagnostic lumbar sympathetic Blk  Diagnostic right genicular NB  Possible right genicular nerve RFA    Completed:   Therapeutic right IA steroid knee inj. x1 (08/18/2020) (100/100/100 x 24 hours/80-85)  Diagnostic bilateral lumbar facet Blk x2 (08/16/18) (100/100/80/90-100)  Therapeutic/palliative right knee Hyalgan inj. x5 (06/25/2019) (100/100/75 x 1 week/70)    Therapeutic  Palliative (PRN) options:   Therapeutic right IA steroid knee inj.  Diagnostic bilateral lumbar facet MBB   Therapeutic/palliative right Hyalgan knee inj.       Recent Visits No visits were found meeting these conditions. Showing recent visits within past 90 days and meeting all other requirements Today's Visits Date Type Provider Dept  08/30/21 Office Visit Milinda Pointer, MD [facility]  Showing today's visits and meeting all other requirements Future Appointments No visits were found meeting these  conditions. Showing future appointments within next 90 days and meeting all other requirements  I discussed the assessment and treatment plan with the patient. The patient was provided an opportunity to ask questions and all were answered. The patient agreed with  the plan and demonstrated an understanding of the instructions.  Patient advised to call back or seek an in-person evaluation if the symptoms or condition worsens.  Duration of encounter: 30 minutes.  Total time on encounter, as per AMA guidelines included both the face-to-face and non-face-to-face time personally spent by the physician and/or other qualified health care professional(s) on the day of the encounter (includes time in activities that require the physician or other qualified health care professional and does not include time in activities normally performed by clinical staff). Physician's time may include the following activities when performed: preparing to see the patient (eg, review of tests, pre-charting review of records) obtaining and/or reviewing separately obtained history performing a medically appropriate examination and/or evaluation counseling and educating the patient/family/caregiver ordering medications, tests, or procedures referring and communicating with other health care professionals (when not separately reported) documenting clinical information in the electronic or other health record independently interpreting results (not separately reported) and communicating results to the patient/ family/caregiver care coordination (not separately reported)  Note by: Gaspar Cola, MD Date: 08/30/2021; Time: 2:18 PM

## 2021-08-30 ENCOUNTER — Encounter: Payer: Self-pay | Admitting: Internal Medicine

## 2021-08-30 ENCOUNTER — Ambulatory Visit (INDEPENDENT_AMBULATORY_CARE_PROVIDER_SITE_OTHER): Payer: No Typology Code available for payment source | Admitting: Cardiology

## 2021-08-30 ENCOUNTER — Encounter: Payer: Self-pay | Admitting: Pain Medicine

## 2021-08-30 ENCOUNTER — Ambulatory Visit: Payer: No Typology Code available for payment source | Attending: Pain Medicine | Admitting: Pain Medicine

## 2021-08-30 ENCOUNTER — Encounter: Payer: Self-pay | Admitting: Cardiology

## 2021-08-30 VITALS — BP 94/65 | HR 78 | Temp 97.9°F | Ht 64.0 in | Wt 177.0 lb

## 2021-08-30 VITALS — BP 104/71 | HR 71 | Ht 64.0 in | Wt 177.8 lb

## 2021-08-30 DIAGNOSIS — Z720 Tobacco use: Secondary | ICD-10-CM

## 2021-08-30 DIAGNOSIS — M47816 Spondylosis without myelopathy or radiculopathy, lumbar region: Secondary | ICD-10-CM | POA: Diagnosis present

## 2021-08-30 DIAGNOSIS — M79642 Pain in left hand: Secondary | ICD-10-CM

## 2021-08-30 DIAGNOSIS — M25552 Pain in left hip: Secondary | ICD-10-CM

## 2021-08-30 DIAGNOSIS — G894 Chronic pain syndrome: Secondary | ICD-10-CM | POA: Diagnosis present

## 2021-08-30 DIAGNOSIS — R937 Abnormal findings on diagnostic imaging of other parts of musculoskeletal system: Secondary | ICD-10-CM

## 2021-08-30 DIAGNOSIS — M4316 Spondylolisthesis, lumbar region: Secondary | ICD-10-CM | POA: Diagnosis present

## 2021-08-30 DIAGNOSIS — I951 Orthostatic hypotension: Secondary | ICD-10-CM

## 2021-08-30 DIAGNOSIS — M25562 Pain in left knee: Secondary | ICD-10-CM

## 2021-08-30 DIAGNOSIS — E785 Hyperlipidemia, unspecified: Secondary | ICD-10-CM

## 2021-08-30 DIAGNOSIS — M545 Low back pain, unspecified: Secondary | ICD-10-CM | POA: Diagnosis present

## 2021-08-30 DIAGNOSIS — Z5111 Encounter for antineoplastic chemotherapy: Secondary | ICD-10-CM | POA: Diagnosis not present

## 2021-08-30 DIAGNOSIS — M79671 Pain in right foot: Secondary | ICD-10-CM | POA: Diagnosis present

## 2021-08-30 DIAGNOSIS — Z79899 Other long term (current) drug therapy: Secondary | ICD-10-CM | POA: Insufficient documentation

## 2021-08-30 DIAGNOSIS — E1165 Type 2 diabetes mellitus with hyperglycemia: Secondary | ICD-10-CM

## 2021-08-30 DIAGNOSIS — E1169 Type 2 diabetes mellitus with other specified complication: Secondary | ICD-10-CM

## 2021-08-30 DIAGNOSIS — Z79891 Long term (current) use of opiate analgesic: Secondary | ICD-10-CM | POA: Diagnosis present

## 2021-08-30 DIAGNOSIS — M2540 Effusion, unspecified joint: Secondary | ICD-10-CM | POA: Diagnosis present

## 2021-08-30 DIAGNOSIS — M79672 Pain in left foot: Secondary | ICD-10-CM | POA: Insufficient documentation

## 2021-08-30 DIAGNOSIS — M79641 Pain in right hand: Secondary | ICD-10-CM

## 2021-08-30 DIAGNOSIS — M5137 Other intervertebral disc degeneration, lumbosacral region: Secondary | ICD-10-CM | POA: Insufficient documentation

## 2021-08-30 DIAGNOSIS — M542 Cervicalgia: Secondary | ICD-10-CM | POA: Diagnosis present

## 2021-08-30 DIAGNOSIS — G893 Neoplasm related pain (acute) (chronic): Secondary | ICD-10-CM | POA: Insufficient documentation

## 2021-08-30 DIAGNOSIS — Z853 Personal history of malignant neoplasm of breast: Secondary | ICD-10-CM

## 2021-08-30 DIAGNOSIS — M25551 Pain in right hip: Secondary | ICD-10-CM

## 2021-08-30 DIAGNOSIS — M47819 Spondylosis without myelopathy or radiculopathy, site unspecified: Secondary | ICD-10-CM | POA: Diagnosis present

## 2021-08-30 DIAGNOSIS — M25561 Pain in right knee: Secondary | ICD-10-CM | POA: Insufficient documentation

## 2021-08-30 DIAGNOSIS — Z794 Long term (current) use of insulin: Secondary | ICD-10-CM

## 2021-08-30 DIAGNOSIS — G8929 Other chronic pain: Secondary | ICD-10-CM

## 2021-08-30 DIAGNOSIS — Z9221 Personal history of antineoplastic chemotherapy: Secondary | ICD-10-CM | POA: Diagnosis not present

## 2021-08-30 MED ORDER — OXYCODONE HCL 5 MG PO TABS: 5.0000 mg | ORAL_TABLET | Freq: Two times a day (BID) | ORAL | 0 refills | Status: DC | PRN

## 2021-08-30 NOTE — Patient Instructions (Addendum)
Medication Instructions:   Your physician recommends that you continue on your current medications as directed. Please refer to the Current Medication list given to you today.  *If you need a refill on your cardiac medications before your next appointment, please call your pharmacy*   Testing/Procedures:  Your physician has requested that you have an echocardiogram. Echocardiography is a painless test that uses sound waves to create images of your heart. It provides your doctor with information about the size and shape of your heart and how well your heart's chambers and valves are working. This procedure takes approximately one hour. There are no restrictions for this procedure.  DO ECHO WITH STRAIN PER DR. Johney Frame    Follow-Up:  3 MONTHS WITH AN EXTENDER IN THE OFFICE   --PLEASE KEEP A LOG OF YOUR BLOOD PRESSURES AND SEND A FEW FOR DR. Johney Frame TO REVIEW VIA MYCHART  Orthostatic Hypotension Blood pressure is a measurement of how strongly, or weakly, your circulating blood is pressing against the walls of your arteries. Orthostatic hypotension is a drop in blood pressure that can happen when you change positions, such as when you go from lying down to standing. Arteries are blood vessels that carry blood from your heart throughout your body. When blood pressure is too low, you may not get enough blood to your brain or to the rest of your organs. Orthostatic hypotension can cause light-headedness, sweating, rapid heartbeat, blurred vision, and fainting. These symptoms require further investigation into the cause. What are the causes? Orthostatic hypotension can be caused by many things, including: Sudden changes in posture, such as standing up quickly after you have been sitting or lying down. Loss of blood (anemia) or loss of body fluids (dehydration). Heart problems, neurologic problems, or hormone problems. Pregnancy. Aging. The risk for this condition increases as you get  older. Severe infection (sepsis). Certain medicines, such as medicines for high blood pressure or medicines that make the body lose excess fluids (diuretics). What are the signs or symptoms? Symptoms of this condition may include: Weakness, light-headedness, or dizziness. Sweating. Blurred vision. Tiredness (fatigue). Rapid heartbeat. Fainting, in severe cases. How is this diagnosed? This condition is diagnosed based on: Your symptoms and medical history. Your blood pressure measurements. Your health care provider will check your blood pressure when you are: Lying down. Sitting. Standing. A blood pressure reading is recorded as two numbers, such as "120 over 80" (or 120/80). The first ("top") number is called the systolic pressure. It is a measure of the pressure in your arteries as your heart beats. The second ("bottom") number is called the diastolic pressure. It is a measure of the pressure in your arteries when your heart relaxes between beats. Blood pressure is measured in a unit called mmHg. Healthy blood pressure for most adults is 120/80 mmHg. Orthostatic hypotension is defined as a 20 mmHg drop in systolic pressure or a 10 mmHg drop in diastolic pressure within 3 minutes of standing. Other information or tests that may be used to diagnose orthostatic hypotension include: Your other vital signs, such as your heart rate and temperature. Blood tests. An electrocardiogram (ECG) or echocardiogram. A Holter monitor. This is a device you wear that records your heart rhythm continuously, usually for 24-48 hours. Tilt table test. For this test, you will be safely secured to a table that moves you from a lying position to an upright position. Your heart rhythm and blood pressure will be monitored during the test. How is this treated? This condition  may be treated by: Changing your diet. This may involve eating more salt (sodium) or drinking more water. Changing the dosage of certain  medicines you are taking that might be lowering your blood pressure. Correcting the underlying reason for the orthostatic hypotension. Wearing compression stockings. Taking medicines to raise your blood pressure. Avoiding actions that trigger symptoms. Follow these instructions at home: Medicines Take over-the-counter and prescription medicines only as told by your health care provider. Follow instructions from your health care provider about changing the dosage of your current medicines, if this applies. Do not stop or adjust any of your medicines on your own. Eating and drinking Illustration of a person drinking a glass of water.   Drink enough fluid to keep your urine pale yellow. Eat extra salt only as directed. Do not add extra salt to your diet unless advised by your health care provider. Eat frequent, small meals. Avoid standing up suddenly after eating. General instructions Compression stockings on a person's lower legs.   Get up slowly from lying down or sitting positions. This gives your blood pressure a chance to adjust. Avoid hot showers and excessive heat as directed by your health care provider. Engage in regular physical activity as directed by your health care provider. If you have compression stockings, wear them as told. Keep all follow-up visits. This is important. Contact a health care provider if: You have a fever for more than 2-3 days. You feel more thirsty than usual. You feel dizzy or weak. Get help right away if: You have chest pain. You have a fast or irregular heartbeat. You become sweaty or feel light-headed. You feel short of breath. You faint. You have any symptoms of a stroke. "BE FAST" is an easy way to remember the main warning signs of a stroke: B - Balance. Signs are dizziness, sudden trouble walking, or loss of balance. E - Eyes. Signs are trouble seeing or a sudden change in vision. F - Face. Signs are sudden weakness or numbness of the  face, or the face or eyelid drooping on one side. A - Arms. Signs are weakness or numbness in an arm. This happens suddenly and usually on one side of the body. S - Speech. Signs are sudden trouble speaking, slurred speech, or trouble understanding what people say. T - Time. Time to call emergency services. Write down what time symptoms started. You have other signs of a stroke, such as: A sudden, severe headache with no known cause. Nausea or vomiting. Seizure. These symptoms may represent a serious problem that is an emergency. Do not wait to see if the symptoms will go away. Get medical help right away. Call your local emergency services (911 in the [place] Do not drive yourself to the hospital. Summary Orthostatic hypotension is a sudden drop in blood pressure. It can cause light-headedness, sweating, rapid heartbeat, blurred vision, and fainting. Orthostatic hypotension can be diagnosed by having your blood pressure taken while lying down, sitting, and then standing. Treatment may involve changing your diet, wearing compression stockings, sitting up slowly, adjusting your medicines, or correcting the underlying reason for the orthostatic hypotension. Get help right away if you have chest pain, a fast or irregular heartbeat, or symptoms of a stroke. This information is not intended to replace advice given to you by your health care provider. Make sure you discuss any questions you have with your health care provider. Document Revised: 04/09/2020 Document Reviewed: 04/09/2020 Elsevier Patient Education  [place].

## 2021-08-30 NOTE — Progress Notes (Signed)
Nursing Pain Medication Assessment:  Safety precautions to be maintained throughout the outpatient stay will include: orient to surroundings, keep bed in low position, maintain call bell within reach at all times, provide assistance with transfer out of bed and ambulation.  Medication Inspection Compliance: Pill count conducted under aseptic conditions, in front of the patient. Neither the pills nor the bottle was removed from the patient's sight at any time. Once count was completed pills were immediately returned to the patient in their original bottle.  Medication: See above Pill/Patch Count:  31 of 60 pills remain Pill/Patch Appearance: Markings consistent with prescribed medication Bottle Appearance: Standard pharmacy container. Clearly labeled. Filled Date: 7 / 6 / 2023 Last Medication intake:  TodaySafety precautions to be maintained throughout the outpatient stay will include: orient to surroundings, keep bed in low position, maintain call bell within reach at all times, provide assistance with transfer out of bed and ambulation.

## 2021-08-30 NOTE — Patient Instructions (Signed)

## 2021-09-01 ENCOUNTER — Other Ambulatory Visit: Payer: No Typology Code available for payment source

## 2021-09-01 DIAGNOSIS — E876 Hypokalemia: Secondary | ICD-10-CM

## 2021-09-02 ENCOUNTER — Encounter: Payer: Self-pay | Admitting: Internal Medicine

## 2021-09-02 LAB — BASIC METABOLIC PANEL
BUN/Creatinine Ratio: 15 (ref 9–23)
BUN: 12 mg/dL (ref 6–24)
CO2: 25 mmol/L (ref 20–29)
Calcium: 9.3 mg/dL (ref 8.7–10.2)
Chloride: 104 mmol/L (ref 96–106)
Creatinine, Ser: 0.78 mg/dL (ref 0.57–1.00)
Glucose: 104 mg/dL — ABNORMAL HIGH (ref 70–99)
Potassium: 3.9 mmol/L (ref 3.5–5.2)
Sodium: 143 mmol/L (ref 134–144)
eGFR: 90 mL/min/{1.73_m2} (ref >59)

## 2021-09-02 NOTE — Progress Notes (Signed)
Contacted via [place] afternoon [place], your labs have returned and overall potassium level has improved to 3.9.  We will recheck next visit, if it trends low again I may keep you on the potassium.  Any questions?

## 2021-09-06 LAB — TOXASSURE SELECT 13 (MW), URINE

## 2021-09-13 ENCOUNTER — Ambulatory Visit
Admission: RE | Admit: 2021-09-13 | Discharge: 2021-09-13 | Disposition: A | Discharge status: Home / Self Care | Payer: No Typology Code available for payment source | Source: Ambulatory Visit | Attending: Surgery | Admitting: Surgery

## 2021-09-13 DIAGNOSIS — Z1231 Encounter for screening mammogram for malignant neoplasm of breast: Secondary | ICD-10-CM | POA: Insufficient documentation

## 2021-09-15 ENCOUNTER — Ambulatory Visit (HOSPITAL_COMMUNITY): Payer: No Typology Code available for payment source | Attending: Cardiology

## 2021-09-15 ENCOUNTER — Other Ambulatory Visit (HOSPITAL_COMMUNITY): Payer: No Typology Code available for payment source

## 2021-09-15 DIAGNOSIS — Z9221 Personal history of antineoplastic chemotherapy: Secondary | ICD-10-CM | POA: Diagnosis present

## 2021-09-15 DIAGNOSIS — Z0189 Encounter for other specified special examinations: Secondary | ICD-10-CM

## 2021-09-15 DIAGNOSIS — Z5111 Encounter for antineoplastic chemotherapy: Secondary | ICD-10-CM | POA: Diagnosis present

## 2021-09-15 DIAGNOSIS — Z853 Personal history of malignant neoplasm of breast: Secondary | ICD-10-CM

## 2021-09-15 LAB — ECHOCARDIOGRAM COMPLETE
Area-P 1/2: 2.28 cm2
S' Lateral: 3.4 cm

## 2021-09-22 ENCOUNTER — Ambulatory Visit (INDEPENDENT_AMBULATORY_CARE_PROVIDER_SITE_OTHER): Payer: No Typology Code available for payment source | Admitting: Surgery

## 2021-09-22 ENCOUNTER — Ambulatory Visit: Payer: No Typology Code available for payment source | Admitting: Surgery

## 2021-09-22 ENCOUNTER — Encounter: Payer: Self-pay | Admitting: Surgery

## 2021-09-22 VITALS — BP 109/71 | HR 87 | Temp 97.9°F | Wt 179.4 lb

## 2021-09-22 DIAGNOSIS — C50512 Malignant neoplasm of lower-outer quadrant of left female breast: Secondary | ICD-10-CM | POA: Diagnosis not present

## 2021-09-22 DIAGNOSIS — Z170 Estrogen receptor positive status [ER+]: Secondary | ICD-10-CM

## 2021-09-22 NOTE — Progress Notes (Signed)
09/22/2021  History of Present Illness: Carolyn Roy is a 56 y.o. female status post left lumpectomy and sentinel lymph node biopsy on 01/12/2018.  She presents today for follow-up.  The patient reported she has been doing well except for some discomfort in the right side of her chest as well as some intermittent left breast discomfort.  Denies any drainage or issues with either breast, nipples, or skin.  She had a mammogram on 09/13/2021 which was negative for any suspicious findings.  I personally viewed the images and agree with the findings.  Past Medical History: Past Medical History:  Diagnosis Date   Allergy    Breast cancer ([place])    Cancer ([place]) 06/15/2017   5.1 cm, T3,N1 (clinical): ER/ PR positive, Her 2 neu not overexpressed, High Ki 67. Neuoadjuvant chemotherapy.    Depression    Diabetes mellitus without complication ([place]) 9509   Edema of left upper extremity    Endometriosis    Family history of breast cancer    Headache    migraines   Hyperlipidemia    Lymphedema of left arm    Ovarian mass    Personal history of chemotherapy    Personal history of radiation therapy    Pneumonia    2018     Past Surgical History: Past Surgical History:  Procedure Laterality Date   AXILLARY LYMPH NODE BIOPSY Left 07/14/2017   Procedure: INSERTION GEL MARK CLIP LEFT AXILLA;  Surgeon: Robert Bellow, MD;  Location: [facility] ORS;  Service: General;  Laterality: Left;   BREAST BIOPSY Left    Dr Orlene Och BREAST METASTATIC CARCINOMA   BREAST LUMPECTOMY Left 01/12/2018   COLONOSCOPY WITH PROPOFOL N/A 12/27/2019   Procedure: COLONOSCOPY WITH PROPOFOL;  Surgeon: Virgel Manifold, MD;  Location: [facility] END[facility];  Service: Endoscopy;  Laterality: N/A;   OOPHORECTOMY     PARTIAL MASTECTOMY WITH NEEDLE LOCALIZATION Left 01/12/2018   Procedure: PARTIAL MASTECTOMY WITH NEEDLE LOCALIZATION;  Surgeon: Robert Bellow, MD;  Location: [facility] ORS;  Service: General;  Laterality: Left;    PORTACATH PLACEMENT Right 07/14/2017   Procedure: INSERTION PORT-A-CATH;  Surgeon: Robert Bellow, MD;  Location: [facility] ORS;  Service: General;  Laterality: Right;   SENTINEL NODE BIOPSY Left 01/12/2018   Procedure: SENTINEL NODE BIOPSY;  Surgeon: Robert Bellow, MD;  Location: [facility] ORS;  Service: General;  Laterality: Left;   TUBAL LIGATION      Home Medications: Prior to Admission medications   Medication Sig Start Date End Date Taking? Authorizing Provider  amitriptyline (ELAVIL) 50 MG tablet TAKE 1 TABLET BY MOUTH EVERYDAY AT BEDTIME 03/15/21  Yes Vaslow, Acey Lav, MD  aspirin-acetaminophen-caffeine (EXCEDRIN MIGRAINE) [phone] MG tablet Take 2 tablets by mouth daily as needed for headache.    Yes [provider]  Blood Glucose Monitoring Suppl (ONETOUCH VERIO) w/Device KIT Use to check blood sugar 3 times a day and document results, bring to appointments.  Goal is <130 fasting blood sugar and <180 two hours after meals. 02/12/21  Yes Cannady, Jolene T, NP  DULoxetine (CYMBALTA) 60 MG capsule Take 60 mg by mouth daily.   Yes [provider]  famotidine (PEPCID) 20 MG tablet TAKE 1 TABLET BY MOUTH EVERY DAY 04/13/21  Yes Cannady, Jolene T, NP  glucose blood test strip Use to check blood sugar 3 times daily, fasting in morning with goal <130 and 2 hours after meals with goal <180.  Bring blood sugar log to visits. 11/11/20  Yes Cannady, Henrine Screws  T, NP  goserelin (ZOLADEX) 3.6 MG injection Inject 3.6 mg into the skin every 28 (twenty-eight) days.   Yes [provider]  Insulin Glargine (BASAGLAR KWIKPEN) 100 UNIT/ML Inject 10 Units into the skin daily. 05/14/21  Yes Cannady, Jolene T, NP  Insulin Pen Needle (NOVOFINE) 30G X 8 MM MISC Inject 10 each into the skin as needed. 05/14/21  Yes Cannady, Jolene T, NP  linagliptin (TRADJENTA) 5 MG TABS tablet Take 1 tablet (5 mg total) by mouth daily. 08/26/21  Yes Cannady, Jolene T, NP  metFORMIN (GLUCOPHAGE-XR) 500 MG 24 hr tablet  Start by taking one tablet (500 MG) twice a day with meals and then increase in one week to two tablets (1000 MG) twice a day with meals. 02/12/21  Yes Cannady, Jolene T, NP  oxyCODONE (OXY IR/ROXICODONE) 5 MG immediate release tablet Take 1 tablet (5 mg total) by mouth 2 (two) times daily as needed for severe pain. Must last 30 days 09/02/21 10/02/21 Yes Milinda Pointer, MD  oxyCODONE (OXY IR/ROXICODONE) 5 MG immediate release tablet Take 1 tablet (5 mg total) by mouth 2 (two) times daily as needed for severe pain. Must last 30 days 11/01/21 12/01/21 Yes Milinda Pointer, MD  potassium chloride (KLOR-CON) 8 MEQ tablet Take 8 mEq by mouth daily.   Yes [provider]  pregabalin (LYRICA) 150 MG capsule Take 1 capsule (150 mg total) by mouth 2 (two) times daily. 07/28/21  Yes Cannady, Jolene T, NP  rosuvastatin (CRESTOR) 40 MG tablet Take 1 tablet (40 mg total) by mouth daily. Stop taking Atorvastatin. 02/13/21  Yes Cannady, Jolene T, NP  SUMAtriptan (IMITREX) 100 MG tablet Take 1 tablet (100 mg total) by mouth every 2 (two) hours as needed for migraine. May repeat in 2 hours if headache persists or recurs. 05/21/21  Yes Vaslow, Acey Lav, MD  tamoxifen (NOLVADEX) 10 MG tablet TAKE 1 TABLET BY MOUTH EVERY DAY 03/15/21  Yes Cammie Sickle, MD  Vitamin D, Ergocalciferol, (DRISDOL) 1.25 MG (50000 UNIT) CAPS capsule TAKE 1 CAPSULE BY MOUTH ONE TIME PER WEEK 06/01/21  Yes Cammie Sickle, MD  potassium chloride (KLOR-CON M) 10 MEQ tablet Take 1 tablet (10 mEq total) by mouth daily for 5 days. 08/26/21 08/31/21  [name], NP    Allergies: Allergies  Allergen Reactions   Aspirin Nausea And Vomiting    Review of Systems: Review of Systems  Constitutional:  Negative for chills and fever.  Respiratory:  Negative for shortness of breath.   Cardiovascular:  Negative for chest pain.  Gastrointestinal:  Negative for abdominal pain, nausea and vomiting.  Skin:  Negative for rash.     Physical Exam BP 109/71   Pulse 87   Temp 97.9 F (36.6 C) (Oral)   Wt 179 lb 6.4 oz (81.4 kg)   LMP  (LMP Unknown) Comment: LAST PERIOD IN MAY 2019 WHEN SHE STARTED CHEMO  SpO2 94%   BMI 30.79 kg/m  CONSTITUTIONAL: No acute distress, well-nourished HEENT:  Normocephalic, atraumatic, extraocular motion intact. RESPIRATORY:  Normal respiratory effort without pathologic use of accessory muscles. CARDIOVASCULAR: Regular rhythm and rate BREAST: Left breast status post lumpectomy with scar well-healed.  There are no palpable masses, skin changes, or nipple changes.  Left axillary incision is also well-healed with no palpable lymphadenopathy.  The area of discomfort is on the lateral chest as well which appears to be more bony pain.  Right breast without any palpable masses, skin changes, or nipple changes.  No  right axillary lymphadenopathy.  Patient discomfort on the right side also towards the lateral chest over the rib.  No palpable masses in either area of discomfort. NEUROLOGIC:  Motor and sensation is grossly normal.  Cranial nerves are grossly intact. PSYCH:  Alert and oriented to person, place and time. Affect is normal.  Labs/Imaging: Alkaline phosphatase 52, CA 27.29: 13.5  Mammogram on 09/13/2021: FINDINGS: There are no findings suspicious for malignancy.   IMPRESSION: No mammographic evidence of malignancy. A result letter of this screening mammogram will be mailed directly to the patient.   RECOMMENDATION: Screening mammogram in one year. (Code:SM-B-01Y)   BI-RADS CATEGORY  1: Negative.  Assessment and Plan: This is a 56 y.o. female status post left ectomy and sentinel lymph node biopsy.  - Patient currently is doing well except for some times discomfort in her lateral chest wall.  This area is her overlying the bony portion of the ribs on either side but I do not palpate any palpable masses there to suggest other disease.  Potentially this could be related to  medications or chemotherapy.  Her more recent labs last month showed a normal alkaline phosphatase and a normal tumor marker.  For now we will can continue monitoring. - Patient to follow-up with me next year with her annual mammogram.  Patient is wondering when her Port-A-Cath can be removed.  I asked her to check with Dr. Rogue Bussing first and when he gives her the clear, we can schedule an office procedure for excision.  I spent 20 minutes dedicated to the care of this patient on the date of this encounter to include pre-visit review of records, face-to-face time with the patient discussing diagnosis and management, and any post-visit coordination of care.   Melvyn Neth, [facility]

## 2021-09-22 NOTE — Patient Instructions (Signed)
If you have any concerns or questions, please feel free to call our office.   Breast Self-Awareness Breast self-awareness is knowing how your breasts look and feel. You need to: Check your breasts on a regular basis. Tell your doctor about any changes. Become familiar with the look and feel of your breasts. This can help you catch a breast problem while it is still small and can be treated. You should do breast self-exams even if you have breast implants. What you need: A mirror. A well-lit room. A pillow or other soft object. How to do a breast self-exam Follow these steps to do a breast self-exam: Look for changes  Take off all the clothes above your waist. Stand in front of a mirror in a room with good lighting. Put your hands down at your sides. Compare your breasts in the mirror. Look for any difference between them, such as: A difference in shape. A difference in size. Wrinkles, dips, and bumps in one breast and not the other. Look at each breast for changes in the skin, such as: Redness. Scaly areas. Skin that has gotten thicker. Dimpling. Open sores (ulcers). Look for changes in your nipples, such as: Fluid coming out of a nipple. Fluid around a nipple. Bleeding. Dimpling. Redness. A nipple that looks pushed in (retracted), or that has changed position. Feel for changes Lie on your back. Feel each breast. To do this: Pick a breast to feel. Place a pillow under the shoulder closest to that breast. Put the arm closest to that breast behind your head. Feel the nipple area of that breast using the hand of your other arm. Feel the area with the pads of your three middle fingers by making small circles with your fingers. Use light, medium, and firm pressure. Continue the overlapping circles, moving downward over the breast. Keep making circles with your fingers. Stop when you feel your ribs. Start making circles with your fingers again, this time going upward until you  reach your collarbone. Then, make circles outward across your breast and into your armpit area. Squeeze your nipple. Check for discharge and lumps. Repeat these steps to check your other breast. Sit or stand in the tub or shower. With soapy water on your skin, feel each breast the same way you did when you were lying down. Write down what you find Writing down what you find can help you remember what to tell your doctor. Write down: What is normal for each breast. Any changes you find in each breast. These include: The kind of changes you find. A tender or painful breast. Any lump you find. Write down its size and where it is. When you last had your monthly period (menstrual cycle). General tips If you are breastfeeding, the best time to check your breasts is after you feed your baby or after you use a breast pump. If you get monthly bleeding, the best time to check your breasts is 5-7 days after your monthly cycle ends. With time, you will become comfortable with the self-exam. You will also start to know if there are changes in your breasts. Contact a doctor if: You see a change in the shape or size of your breasts or nipples. You see a change in the skin of your breast or nipples, such as red or scaly skin. You have fluid coming from your nipples that is not normal. You find a new lump or thick area. You have breast pain. You have any concerns about your   breast health. Summary Breast self-awareness includes looking for changes in your breasts and feeling for changes within your breasts. You should do breast self-awareness in front of a mirror in a well-lit room. If you get monthly periods (menstrual cycles), the best time to check your breasts is 5-7 days after your period ends. Tell your doctor about any changes you see in your breasts. Changes include changes in size, changes on the skin, painful or tender breasts, or fluid from your nipples that is not normal. This information is  not intended to replace advice given to you by your health care provider. Make sure you discuss any questions you have with your health care provider. Document Revised: 11/26/2020 Document Reviewed: 11/26/2020 Elsevier Patient Education  2023 Elsevier Inc.  

## 2021-09-23 ENCOUNTER — Encounter: Payer: Self-pay | Admitting: Surgery

## 2021-10-17 ENCOUNTER — Other Ambulatory Visit: Payer: Self-pay | Admitting: Nurse Practitioner

## 2021-10-17 DIAGNOSIS — E876 Hypokalemia: Secondary | ICD-10-CM

## 2021-10-19 NOTE — Telephone Encounter (Signed)
Tried calling patient, no answer and no VM. Will try to contact again.

## 2021-10-19 NOTE — Telephone Encounter (Signed)
Requested medication (s) are due for refill today: review  Requested medication (s) are on the active medication list: yes  Last refill:  08/26/21 #5/0  Future visit scheduled: yes  Notes to clinic:  rx was for 5 days but pt requesting refill. Please review     Requested Prescriptions  Pending Prescriptions Disp Refills   KLOR-CON M10 10 MEQ tablet [Pharmacy Med Name: KLOR-CON M10 TABLET] 5 tablet 0    Sig: TAKE 1 TABLET (10 MEQ TOTAL) BY MOUTH DAILY FOR 5 DAYS.     Endocrinology:  Minerals - Potassium Supplementation Passed - 10/17/2021 10:40 AM      Passed - K in normal range and within 360 days    Potassium  Date Value Ref Range Status  09/01/2021 3.9 3.5 - 5.2 mmol/L Final         Passed - Cr in normal range and within 360 days    Creatinine, Ser  Date Value Ref Range Status  09/01/2021 0.78 0.57 - 1.00 mg/dL Final         Passed - Valid encounter within last 12 months    Recent Outpatient Visits           1 month ago Type 2 diabetes mellitus with hyperglycemia, with long-term current use of insulin ([place])   [place], Jolene T, NP   2 months ago Type 2 diabetes mellitus with hyperglycemia, with long-term current use of insulin ([place])   [place], Jolene T, NP   5 months ago Type 2 diabetes mellitus with hyperglycemia, with long-term current use of insulin ([place])   [place], Jolene T, NP   8 months ago Type 2 diabetes mellitus with hyperglycemia, with long-term current use of insulin ([place])   [place], Ryan T, NP   9 months ago Gastroesophageal reflux disease without esophagitis   [place], Barbaraann Faster, NP       Future Appointments             In 1 month Cannady, Barbaraann Faster, NP [employer], [facility]   In 1 month [place], Terance Hart, PA-C [employer] [facility] [employer]. [facility], [facility]

## 2021-10-22 NOTE — Telephone Encounter (Signed)
Tried to contact patient, no answer and no VM. Will refuse and ask for patient to contact the office.

## 2021-10-29 ENCOUNTER — Other Ambulatory Visit: Payer: Self-pay

## 2021-10-29 ENCOUNTER — Other Ambulatory Visit: Payer: Self-pay | Admitting: Internal Medicine

## 2021-10-29 NOTE — Telephone Encounter (Signed)
08/27/21 visit with Sonia Baller B with f/u plan:  RTC in 3 months for labs, see Dr. Jacinto Reap and Zoladex.  Next visit on 11/29/20

## 2021-11-20 NOTE — Progress Notes (Unsigned)
PROVIDER NOTE: Information contained herein reflects review and annotations entered in association with encounter. Interpretation of such information and data should be left to medically-trained personnel. Information provided to patient can be located elsewhere in the medical record under "Patient Instructions". Document created using STT-dictation technology, any transcriptional errors that may result from process are unintentional.    Patient: Carolyn Roy  Service Category: E/M  Provider: Gaspar Cola, MD  DOB: Oct 28, 1965  DOS: 11/24/2021  Referring Provider: [name], NP  MRN: [id number]  Specialty: Interventional Pain Management  PCP: [name], NP  Type: Established Patient  Setting: Ambulatory outpatient    Location: Office  Delivery: Face-to-face     HPI  [name], a 56 y.o. year old female, is here today because of her No primary diagnosis found.. [name] primary complain today is No chief complaint on file. Last encounter: My last encounter with her was on 08/30/2021. Pertinent problems: Ms. Hoadley has Chronic fatigue; Family history of breast cancer; Chemotherapy-induced neuropathy ([place]); Malignant neoplasm of lower-outer quadrant of left breast of female, estrogen receptor positive ([place]); Chronic feet pain (1ry area of Pain) (Bilateral) (R>L); Neuropathic pain of feet (Bilateral); Chronic knee pain (2ry area of Pain) (Bilateral) (R>L); Chronic hand pain (3ry area of Pain) (Bilateral) (R>L); Chronic pain syndrome; Chronic hip pain (4th area of Pain) (Bilateral) (R>L); Chronic upper extremity pain (3ry area of Pain) (Bilateral) (R>L); Cancer-related pain; Neuropathic pain; Osteoarthritis of knee (Right); Chronic low back pain (Bilateral (L>R) w/o sciatica; Chronic hip pain (Left); Lumbar facet syndrome (Bilateral) (L>R); Idiopathic scoliosis; Chronic sacroiliac joint pain (Left); Chronic pain of knee (Right); Abnormal MRI, lumbar spine (04/20/2018);  Lumbar facet arthropathy; Spondylosis without myelopathy or radiculopathy, lumbosacral region; DDD (degenerative disc disease), lumbosacral; Numbness and tingling of upper extremity (C6/C7 dermatomes) (Right); Personal history of breast cancer; Cervicalgia; Cervical radiculitis (Right); Effusion of knee joint (Right); Tricompartment osteoarthritis of knee (Right); 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 Pain Assessment: Severity of   is reported as a  /10. Location:    / . Onset:  . Quality:  . Timing:  . Modifying factor(s):  [name] Vitals:  vitals were not taken for this visit.   Reason for encounter:  *** . ***  Pharmacotherapy Assessment  Analgesic: Oxycodone IR 5 mg, 1 tab PO BID (10 mg/day of oxycodone) (Average: 2 tabs/day = 10 mg/day of oxycodone) MME/day: 15 mg/day.   Monitoring: [place] PMP: PDMP reviewed during this encounter.       Pharmacotherapy: No side-effects or adverse reactions reported. Compliance: No problems identified. Effectiveness: Clinically acceptable.  No notes on file  No results found for: "CBDTHCR" No results found for: "D8THCCBX" No results found for: "D9THCCBX"  UDS:  Summary  Date Value Ref Range Status  08/31/2021 Note  Final    Comment:    ==================================================================== [employer] (MW) ==================================================================== Test                             Result       Flag       Units  Drug Present and Declared for Prescription Verification   Oxycodone                      1261  EXPECTED   ng/mg creat   Oxymorphone                    662          EXPECTED   ng/mg creat   Noroxycodone                   2337         EXPECTED   ng/mg creat   Noroxymorphone                 157          EXPECTED   ng/mg creat    Sources of oxycodone are  scheduled prescription medications.    Oxymorphone, noroxycodone, and noroxymorphone are expected    metabolites of oxycodone. Oxymorphone is also available as a    scheduled prescription medication.  ==================================================================== Test                      Result    Flag   Units      Ref Range   Creatinine              112              mg/dL      >=20 ==================================================================== Declared Medications:  The flagging and interpretation on this report are based on the  following declared medications.  Unexpected results may arise from  inaccuracies in the declared medications.   **Note: The testing scope of this panel includes these medications:   Oxycodone (OxyIR)   **Note: The testing scope of this panel does not include the  following reported medications:   Acetaminophen (Excedrin)  Amitriptyline (Elavil)  Aspirin (Excedrin)  Caffeine (Excedrin)  Duloxetine (Cymbalta)  Famotidine (Pepcid)  Goserelin  Insulin (Basaglar)  Linagliptin (Tradjenta)  Metformin (Glucophage)  Potassium (Klor-Con)  Pregabalin (Lyrica)  Rosuvastatin (Crestor)  Sumatriptan (Imitrex)  Tamoxifen (Nolvadex)  Vitamin D2 (Drisdol) ==================================================================== For clinical consultation, please call [phone]. ====================================================================       ROS  Constitutional: Denies any fever or chills Gastrointestinal: No reported hemesis, hematochezia, vomiting, or acute GI distress Musculoskeletal: Denies any acute onset joint swelling, redness, loss of ROM, or weakness Neurological: No reported episodes of acute onset apraxia, aphasia, dysarthria, agnosia, amnesia, paralysis, loss of coordination, or loss of consciousness  Medication Review  Basaglar KwikPen, DULoxetine, Insulin Pen Needle, OneTouch Verio, SUMAtriptan, Vitamin D  (Ergocalciferol), amitriptyline, aspirin-acetaminophen-caffeine, famotidine, glucose blood, goserelin, linagliptin, metFORMIN, oxyCODONE, potassium chloride, pregabalin, rosuvastatin, and tamoxifen  History Review  Allergy: Ms. Gaylord is allergic to aspirin. Drug: Ms. Cadle  reports no history of drug use. Alcohol:  reports no history of alcohol use. Tobacco:  reports that she has been smoking cigarettes. She has a 11.00 pack-year smoking history. She has quit using smokeless tobacco.  Her smokeless tobacco use included snuff. Social: Ms. Caba  reports that she has been smoking cigarettes. She has a 11.00 pack-year smoking history. She has quit using smokeless tobacco.  Her smokeless tobacco use included snuff. She reports that she does not drink alcohol and does not use drugs. Medical:  has a past medical history of Allergy, Breast cancer ([place]), Cancer ([place]) (06/15/2017), Depression, Diabetes mellitus without complication ([place]) (5427), Edema of left upper extremity, Endometriosis, Family history of breast cancer, Headache, Hyperlipidemia, Lymphedema of left arm, Ovarian mass, Personal history of chemotherapy, Personal history of radiation therapy, and Pneumonia. Surgical: Ms. Lanzo  has a past surgical history that  includes Oophorectomy; Tubal ligation; Portacath placement (Right, 07/14/2017); Axillary lymph node biopsy (Left, 07/14/2017); Breast lumpectomy (Left, 01/12/2018); Breast biopsy (Left); Partial mastectomy with needle localization (Left, 01/12/2018); Sentinel node biopsy (Left, 01/12/2018); and Colonoscopy with propofol (N/A, 12/27/2019). Family: family history includes Breast cancer in an other family member; Breast cancer (age of onset: 9) in her maternal grandmother; Breast cancer (age of onset: 15) in an other family member; Breast cancer (age of onset: 15) in her maternal aunt; Cancer in her mother; Colon cancer in her maternal grandmother and mother; Diabetes in her brother; Other in her  father; Pancreatitis in her brother; Prostate cancer (age of onset: 4) in her brother.  Laboratory Chemistry Profile   Renal Lab Results  Component Value Date   BUN 12 09/01/2021   CREATININE 0.78 09/01/2021   BCR 15 09/01/2021   GFRAA >60 10/21/2019   GFRNONAA >60 08/27/2021    Hepatic Lab Results  Component Value Date   AST 21 08/27/2021   ALT 14 08/27/2021   ALBUMIN 3.5 08/27/2021   ALKPHOS 52 08/27/2021   LIPASE 31 12/01/2020    Electrolytes Lab Results  Component Value Date   NA 143 09/01/2021   K 3.9 09/01/2021   CL 104 09/01/2021   CALCIUM 9.3 09/01/2021   MG 2.1 02/12/2021    Bone Lab Results  Component Value Date   VD25OH 45.4 11/11/2020    Inflammation (CRP: Acute Phase) (ESR: Chronic Phase) Lab Results  Component Value Date   CRP <1 12/21/2020   ESRSEDRATE 3 12/21/2020         Note: Above Lab results reviewed.  Recent Imaging Review  ECHOCARDIOGRAM COMPLETE    ECHOCARDIOGRAM REPORT       Patient Name:   Laurie A WHITE Date of Exam: 09/15/2021 Medical Rec #:  [id number]         Height:       64.0 in Accession #:    [id number]        Weight:       177.0 lb Date of Birth:  Dec 21, 1965         BSA:          1.857 m Patient Age:    72 years          BP:           104/71 mmHg Patient Gender: F                 HR:           82 bpm. Exam Location:  [place]  Procedure: 2D Echo, Cardiac Doppler, Color Doppler, 3D Echo and Strain Analysis  Indications:    Z85.3 Breast cancer   History:        Patient has prior history of Echocardiogram examinations, most                 recent 06/28/2017. Breast cancer, Signs/Symptoms:H/o chemoth;                 Risk Factors:Current Smoker, Dyslipidemia and Diabetes.   Sonographer:    Coralyn Helling RDCS Referring Phys: [id number] [place]   1. Left ventricular ejection fraction, by estimation, is 55 to 60%. The left ventricle has normal function. The left ventricle has no regional  wall motion abnormalities. Left ventricular diastolic parameters are consistent with Grade I diastolic  dysfunction (impaired relaxation). The average left ventricular global longitudinal strain is -16.7 %. The global longitudinal strain is normal.  GLS underestimated due to poor endocardial tracking.  2. Right ventricular systolic function is normal. The right ventricular size is normal.  3. Left atrial size was mild to moderately dilated.  4. The mitral valve is normal in structure. Trivial mitral valve regurgitation. No evidence of mitral stenosis.  5. The aortic valve is tricuspid. There is mild calcification of the aortic valve. Aortic valve regurgitation is not visualized. Aortic valve sclerosis/calcification is present, without any evidence of aortic stenosis.  6. The inferior vena cava is normal in size with greater than 50% respiratory variability, suggesting right atrial pressure of 3 mmHg.  FINDINGS  Left Ventricle: Left ventricular ejection fraction, by estimation, is 55 to 60%. The left ventricle has normal function. The left ventricle has no regional wall motion abnormalities. The average left ventricular global longitudinal strain is -16.7 %.  The global longitudinal strain is normal. The left ventricular internal cavity size was normal in size. There is no left ventricular hypertrophy. Left ventricular diastolic parameters are consistent with Grade I diastolic dysfunction (impaired  relaxation).  Right Ventricle: The right ventricular size is normal. No increase in right ventricular wall thickness. Right ventricular systolic function is normal.  Left Atrium: Left atrial size was mild to moderately dilated.  Right Atrium: Right atrial size was normal in size.  Pericardium: There is no evidence of pericardial effusion.  Mitral Valve: The mitral valve is normal in structure. Trivial mitral valve regurgitation. No evidence of mitral valve stenosis.  Tricuspid Valve: The tricuspid  valve is normal in structure. Tricuspid valve regurgitation is trivial. No evidence of tricuspid stenosis.  Aortic Valve: The aortic valve is tricuspid. There is mild calcification of the aortic valve. Aortic valve regurgitation is not visualized. Aortic valve sclerosis/calcification is present, without any evidence of aortic stenosis.  Pulmonic Valve: The pulmonic valve was normal in structure. Pulmonic valve regurgitation is not visualized. No evidence of pulmonic stenosis.  Aorta: The aortic root is normal in size and structure.  Venous: The inferior vena cava is normal in size with greater than 50% respiratory variability, suggesting right atrial pressure of 3 mmHg.  IAS/Shunts: No atrial level shunt detected by color flow Doppler.    LEFT VENTRICLE PLAX 2D LVIDd:         4.80 cm   Diastology LVIDs:         3.40 cm   LV e' medial:    12.30 cm/s LV PW:         0.90 cm   LV E/e' medial:  5.8 LV IVS:        0.90 cm   LV e' lateral:   14.30 cm/s LVOT diam:     2.00 cm   LV E/e' lateral: 5.0 LV SV:         55 LV SV Index:   30        2D Longitudinal Strain LVOT Area:     3.14 cm  2D Strain GLS Avg:     -16.7 %                            3D Volume EF:                          3D EF:        58 %  LV EDV:       126 ml                          LV ESV:       53 ml                          LV SV:        73 ml  RIGHT VENTRICLE             IVC RV S prime:     14.10 cm/s  IVC diam: 1.30 cm TAPSE (M-mode): 1.5 cm RVSP:           21.5 mmHg  LEFT ATRIUM             Index        RIGHT ATRIUM           Index LA diam:        4.20 cm 2.26 cm/m   RA Pressure: 3.00 mmHg LA Vol (A2C):   78.1 ml 42.05 ml/m  RA Area:     13.30 cm LA Vol (A4C):   60.1 ml 32.36 ml/m  RA Volume:   32.70 ml  17.61 ml/m LA Biplane Vol: 69.3 ml 37.31 ml/m  AORTIC VALVE LVOT Vmax:   81.40 cm/s LVOT Vmean:  54.900 cm/s LVOT VTI:    0.175 m   AORTA Ao Root diam: 3.30 cm Ao Asc diam:   3.50 cm  MITRAL VALVE               TRICUSPID VALVE MV Area (PHT): 2.28 cm    TR Peak grad:   18.5 mmHg MV Decel Time: 333 msec    TR Vmax:        215.00 cm/s MV E velocity: 71.30 cm/s  Estimated RAP:  3.00 mmHg MV A velocity: 61.30 cm/s  RVSP:           21.5 mmHg MV E/A ratio:  1.16                            SHUNTS                            Systemic VTI:  0.18 m                            Systemic Diam: 2.00 cm  Glori Bickers MD Electronically signed by Glori Bickers MD Signature Date/Time: 09/15/2021/5:10:06 PM      Final   Note: Reviewed        Physical Exam  General appearance: Well nourished, well developed, and well hydrated. In no apparent acute distress Mental status: Alert, oriented x 3 (person, place, & time)       Respiratory: No evidence of acute respiratory distress Eyes: PERLA Vitals: LMP  (LMP Unknown) Comment: LAST PERIOD IN MAY 2019 WHEN SHE STARTED CHEMO BMI: Estimated body mass index is 30.79 kg/m as calculated from the following:   Height as of 08/30/21: _0  (1.626 m).   Weight as of 09/22/21: 179 lb 6.4 oz (81.4 kg). Ideal: Patient weight not recorded  Assessment   Diagnosis Status  No diagnosis found. Controlled Controlled Controlled   Updated Problems: No problems updated.   Plan of Care  Problem-specific:  No problem-specific  Assessment & Plan notes found for this encounter.  Ms. LERLINE VALDIVIA has a current medication list which includes the following long-term medication(s): amitriptyline, famotidine, basaglar kwikpen, linagliptin, metformin, oxycodone, oxycodone, potassium chloride, potassium chloride, pregabalin, rosuvastatin, and sumatriptan.  Pharmacotherapy (Medications Ordered): No orders of the defined types were placed in this encounter.  Orders:  No orders of the defined types were placed in this encounter.  Follow-up plan:   No follow-ups on file.     Interventional Therapies  Risk  Complexity Considerations:    Estimated body mass index is 30.55 kg/m as calculated from the following:   Height as of this encounter: _0  (1.626 m).   Weight as of this encounter: 178 lb (80.7 kg). WNL   Planned  Pending:      Under consideration:   Possible bilateral lumbar facet RFA  Diagnostic lumbar sympathetic Blk  Diagnostic right genicular NB  Possible right genicular nerve RFA    Completed:   Therapeutic right IA steroid knee inj. x1 (08/18/2020) (100/100/100 x 24 hours/80-85)  Diagnostic bilateral lumbar facet Blk x2 (08/16/18) (100/100/80/90-100)  Therapeutic/palliative right knee Hyalgan inj. x5 (06/25/2019) (100/100/75 x 1 week/70)    Therapeutic  Palliative (PRN) options:   Therapeutic right IA steroid knee inj.  Diagnostic bilateral lumbar facet MBB   Therapeutic/palliative right Hyalgan knee inj.        Recent Visits Date Type Provider Dept  08/30/21 Office Visit Milinda Pointer, MD [facility]  Showing recent visits within past 90 days and meeting all other requirements Future Appointments Date Type Provider Dept  11/24/21 Appointment Milinda Pointer, MD [facility]  Showing future appointments within next 90 days and meeting all other requirements  I discussed the assessment and treatment plan with the patient. The patient was provided an opportunity to ask questions and all were answered. The patient agreed with the plan and demonstrated an understanding of the instructions.  Patient advised to call back or seek an in-person evaluation if the symptoms or condition worsens.  Duration of encounter: *** minutes.  Total time on encounter, as per AMA guidelines included both the face-to-face and non-face-to-face time personally spent by the physician and/or other qualified health care professional(s) on the day of the encounter (includes time in activities that require the physician or other qualified health care professional and does not include time in  activities normally performed by clinical staff). Physician's time may include the following activities when performed: preparing to see the patient (eg, review of tests, pre-charting review of records) obtaining and/or reviewing separately obtained history performing a medically appropriate examination and/or evaluation counseling and educating the patient/family/caregiver ordering medications, tests, or procedures referring and communicating with other health care professionals (when not separately reported) documenting clinical information in the electronic or other health record independently interpreting results (not separately reported) and communicating results to the patient/ family/caregiver care coordination (not separately reported)  Note by: Gaspar Cola, MD Date: 11/24/2021; Time: 10:02 AM

## 2021-11-23 NOTE — Patient Instructions (Signed)
Naloxone Nasal Spray What is this medication? NALOXONE (nal OX one) treats opioid overdose, which causes slow or shallow breathing, severe drowsiness, or trouble staying awake. Call emergency services after using this medication. You may need additional treatment. Naloxone works by reversing the effects of opioids. It belongs to a group of medications called opioid blockers. This medicine may be used for other purposes; ask your health care provider or pharmacist if you have questions. COMMON BRAND NAME(S): Kloxxado, Narcan What should I tell my care team before I take this medication? They need to know if you have any of these conditions: Heart disease Substance use disorder An unusual or allergic reaction to naloxone, other medications, foods, dyes, or preservatives Pregnant or trying to get pregnant Breast-feeding How should I use this medication? This medication is for use in the nose. Lay the person on their back. Support their neck with your hand and allow the head to tilt back before giving the medication. The nasal spray should be given into 1 nostril. After giving the medication, move the person onto their side. Do not remove or test the nasal spray until ready to use. Get emergency medical help right away after giving the first dose of this medication, even if the person wakes up. You should be familiar with how to recognize the signs and symptoms of a narcotic overdose. If more doses are needed, give the additional dose in the other nostril. Talk to your care team about the use of this medication in children. While this medication may be prescribed for children as young as newborns for selected conditions, precautions do apply. Overdosage: If you think you have taken too much of this medicine contact a poison control center or emergency room at once. NOTE: This medicine is only for you. Do not share this medicine with others. What if I miss a dose? This does not apply. What may  interact with this medication? This is only used during an emergency. No interactions are expected during emergency use. This list may not describe all possible interactions. Give your health care provider a list of all the medicines, herbs, non-prescription drugs, or dietary supplements you use. Also tell them if you smoke, drink alcohol, or use illegal drugs. Some items may interact with your medicine. What should I watch for while using this medication? Keep this medication ready for use in the case of an opioid overdose. Make sure that you have the phone number of your care team and local hospital ready. You may need to have additional doses of this medication. Each nasal spray contains a single dose. Some emergencies may require additional doses. After use, bring the treated person to the nearest hospital or call 911. Make sure the treating care team knows that the person has received a dose of this medication. You will receive additional instructions on what to do during and after use of this medication before an emergency occurs. What side effects may I notice from receiving this medication? Side effects that you should report to your care team as soon as possible: Allergic reactions--skin rash, itching, hives, swelling of the face, lips, tongue, or throat Side effects that usually do not require medical attention (report these to your care team if they continue or are bothersome): Constipation Dryness or irritation inside the nose Headache Increase in blood pressure Muscle spasms Stuffy nose Toothache This list may not describe all possible side effects. Call your doctor for medical advice about side effects. You may report side effects to FDA  at 1-800-FDA-1088. Where should I keep my medication? Keep out of the reach of children and pets. Store between 20 and 25 degrees C (68 and 77 degrees F). Do not freeze. Throw away any unused medication after the expiration date. Keep in original  box until ready to use. NOTE: This sheet is a summary. It may not cover all possible information. If you have questions about this medicine, talk to your doctor, pharmacist, or health care provider.  2023 Elsevier/Gold Standard (2020-12-21 00:00:00) ____________________________________________________________________________________________  Medication Rules  Purpose: To inform patients, and their family members, of our rules and regulations.  Applies to: All patients receiving prescriptions (written or electronic).  Pharmacy of record: Pharmacy where electronic prescriptions will be sent. If written prescriptions are taken to a different pharmacy, please inform the nursing staff. The pharmacy listed in the electronic medical record should be the one where you would like electronic prescriptions to be sent.  Electronic prescriptions: In compliance with the [place] (STOP) Act of 2017 (Session Lanny Cramp [id number]), effective February 07, 2018, all controlled substances must be electronically prescribed. Calling prescriptions to the pharmacy will cease to exist.  Prescription refills: Only during scheduled appointments. Applies to all prescriptions.  NOTE: The following applies primarily to controlled substances (Opioid* Pain Medications).   Type of encounter (visit): For patients receiving controlled substances, face-to-face visits are required. (Not an option or up to the patient.)  Patient's responsibilities: Pain Pills: Bring all pain pills to every appointment (except for procedure appointments). Pill Bottles: Bring pills in original pharmacy bottle. Always bring the newest bottle. Bring bottle, even if empty. Medication refills: You are responsible for knowing and keeping track of what medications you take and those you need refilled. The day before your appointment: write a list of all prescriptions that need to be refilled. The day of the  appointment: give the list to the admitting nurse. Prescriptions will be written only during appointments. No prescriptions will be written on procedure days. If you forget a medication: it will not be "Called in", "Faxed", or "electronically sent". You will need to get another appointment to get these prescribed. No early refills. Do not call asking to have your prescription filled early. Prescription Accuracy: You are responsible for carefully inspecting your prescriptions before leaving our office. Have the discharge nurse carefully go over each prescription with you, before taking them home. Make sure that your name is accurately spelled, that your address is correct. Check the name and dose of your medication to make sure it is accurate. Check the number of pills, and the written instructions to make sure they are clear and accurate. Make sure that you are given enough medication to last until your next medication refill appointment. Taking Medication: Take medication as prescribed. When it comes to controlled substances, taking less pills or less frequently than prescribed is permitted and encouraged. Never take more pills than instructed. Never take medication more frequently than prescribed.  Inform other Doctors: Always inform, all of your healthcare providers, of all the medications you take. Pain Medication from other Providers: You are not allowed to accept any additional pain medication from any other Doctor or Healthcare provider. There are two exceptions to this rule. (see below) In the event that you require additional pain medication, you are responsible for notifying us, as stated below. Cough Medicine: Often these contain an opioid, such as codeine or hydrocodone. Never accept or take cough medicine containing these opioids if you are already taking  an opioid* medication. The combination may cause respiratory failure and death. Medication Agreement: You are responsible for carefully  reading and following our Medication Agreement. This must be signed before receiving any prescriptions from our practice. Safely store a copy of your signed Agreement. Violations to the Agreement will result in no further prescriptions. (Additional copies of our Medication Agreement are available upon request.) Laws, Rules, & Regulations: All patients are expected to follow all Federal and [employer], Rules, [employer] [facility]. Ignorance of the Laws does not constitute a valid excuse.  Illegal drugs and Controlled Substances: The use of illegal substances (including, but not limited to marijuana and its derivatives) and/or the illegal use of any controlled substances is strictly prohibited. Violation of this rule may result in the immediate and permanent discontinuation of any and all prescriptions being written by our practice. The use of any illegal substances is prohibited. Adopted CDC guidelines & recommendations: Target dosing levels will be at or below 60 MME/day. Use of benzodiazepines** is not recommended.  Exceptions: There are only two exceptions to the rule of not receiving pain medications from other Healthcare Providers. Exception #1 (Emergencies): In the event of an emergency (i.e.: accident requiring emergency care), you are allowed to receive additional pain medication. However, you are responsible for: As soon as you are able, call our office [phone], at any time of the day or night, and leave a message stating your name, the date and nature of the emergency, and the name and dose of the medication prescribed. In the event that your call is answered by a member of our staff, make sure to document and save the date, time, and the name of the person that took your information.  Exception #2 (Planned Surgery): In the event that you are scheduled by another doctor or dentist to have any type of surgery or procedure, you are allowed (for a period no longer than 30 days), to receive  additional pain medication, for the acute post-op pain. However, in this case, you are responsible for picking up a copy of our "Post-op Pain Management for Surgeons" handout, and giving it to your surgeon or dentist. This document is available at our office, and does not require an appointment to obtain it. Simply go to our office during business hours (Monday-Thursday from 8:00 AM to 4:00 PM) (Friday 8:00 AM to 12:00 Noon) or if you have a scheduled appointment with [place], prior to your surgery, and ask for it by name. In addition, you are responsible for: calling our office [phone], at any time of the day or night, and leaving a message stating your name, name of your surgeon, type of surgery, and date of procedure or surgery. Failure to comply with your responsibilities may result in termination of therapy involving the controlled substances. Medication Agreement Violation. Following the above rules, including your responsibilities will help you in avoiding a Medication Agreement Violation ("Breaking your Pain Medication Contract").  *Opioid medications include: morphine, codeine, oxycodone, oxymorphone, hydrocodone, hydromorphone, meperidine, tramadol, tapentadol, buprenorphine, fentanyl, methadone. **Benzodiazepine medications include: diazepam (Valium), alprazolam (Xanax), clonazepam (Klonopine), lorazepam (Ativan), clorazepate (Tranxene), chlordiazepoxide (Librium), estazolam (Prosom), oxazepam (Serax), temazepam (Restoril), triazolam (Halcion) (Last updated: 11/04/2020) ____________________________________________________________________________________________  ____________________________________________________________________________________________  Medication Recommendations and Reminders  Applies to: All patients receiving prescriptions (written and/or electronic).  Medication Rules & Regulations: These rules and regulations exist for your safety and that of others. They are not  flexible and neither are we. Dismissing or ignoring them will be considered "non-compliance"  with medication therapy, resulting in complete and irreversible termination of such therapy. (See document titled "Medication Rules" for more details.) In all conscience, because of safety reasons, we cannot continue providing a therapy where the patient does not follow instructions.  Pharmacy of record:  Definition: This is the pharmacy where your electronic prescriptions will be sent.  We do not endorse any particular pharmacy, however, we have experienced problems with [facility] not securing enough medication supply for the community. We do not restrict you in your choice of pharmacy. However, once we write for your prescriptions, we will NOT be re-sending more prescriptions to fix restricted supply problems created by your pharmacy, or your insurance.  The pharmacy listed in the electronic medical record should be the one where you want electronic prescriptions to be sent. If you choose to change pharmacy, simply notify our nursing staff.  Recommendations: Keep all of your pain medications in a safe place, under lock and key, even if you live alone. We will NOT replace lost, stolen, or damaged medication. After you fill your prescription, take 1 week's worth of pills and put them away in a safe place. You should keep a separate, properly labeled bottle for this purpose. The remainder should be kept in the original bottle. Use this as your primary supply, until it runs out. Once it's gone, then you know that you have 1 week's worth of medicine, and it is time to come in for a prescription refill. If you do this correctly, it is unlikely that you will ever run out of medicine. To make sure that the above recommendation works, it is very important that you make sure your medication refill appointments are scheduled at least 1 week before you run out of medicine. To do this in an effective manner, make sure that  you do not leave the office without scheduling your next medication management appointment. Always ask the nursing staff to show you in your prescription , when your medication will be running out. Then arrange for the receptionist to get you a return appointment, at least 7 days before you run out of medicine. Do not wait until you have 1 or 2 pills left, to come in. This is very poor planning and does not take into consideration that we may need to cancel appointments due to bad weather, sickness, or emergencies affecting our staff. DO NOT ACCEPT A "Partial Fill": If for any reason your pharmacy does not have enough pills/tablets to completely fill or refill your prescription, do not allow for a "partial fill". The law allows the pharmacy to complete that prescription within 72 hours, without requiring a new prescription. If they do not fill the rest of your prescription within those 72 hours, you will need a separate prescription to fill the remaining amount, which we will NOT provide. If the reason for the partial fill is your insurance, you will need to talk to the pharmacist about payment alternatives for the remaining tablets, but again, DO NOT ACCEPT A PARTIAL FILL, unless you can trust your pharmacist to obtain the remainder of the pills within 72 hours.  Prescription refills and/or changes in medication(s):  Prescription refills, and/or changes in dose or medication, will be conducted only during scheduled medication management appointments. (Applies to both, written and electronic prescriptions.) No refills on procedure days. No medication will be changed or started on procedure days. No changes, adjustments, and/or refills will be conducted on a procedure day. Doing so will interfere with the diagnostic portion  of the procedure. No phone refills. No medications will be "called into the pharmacy". No Fax refills. No weekend refills. No Holliday refills. No after hours refills.  Remember:   Business hours are:  Monday to Thursday 8:00 AM to 4:00 PM Provider's Schedule: Milinda Pointer, MD - Appointments are:  Medication management: Monday and Wednesday 8:00 AM to 4:00 PM Procedure day: Tuesday and Thursday 7:30 AM to 4:00 PM Gillis Santa, MD - Appointments are:  Medication management: Tuesday and Thursday 8:00 AM to 4:00 PM Procedure day: Monday and Wednesday 7:30 AM to 4:00 PM (Last update: 08/28/2019) ____________________________________________________________________________________________  ____________________________________________________________________________________________  Pharmacy Shortages of Pain Medication   Introduction Shockingly as it may seem, .  "No U.[place] Supreme Court decision has ever interpreted the Constitution as guaranteeing a right to health care for all Americans." - https://huff.com/  "With respect to human rights, the [place] has no formally codified right to health, nor does it participate in a human rights treaty that specifies a right to health." - Scott J. Schweikart, JD, MBE  Situation By now, most of our patients have had the experience of being told by their pharmacist that they do not have enough medication to cover their prescription. If you have not had this experience, just know that you soon will.  Problem There appears to be a shortage of these medications, either at the national level or locally. This is happening with all pharmacies. When there is not enough medication, patients are offered a partial fill and they are told that they will try to get the rest of the medicine for them at a later time. If they do not have enough for even a partial fill, the pharmacists are telling the patients to call us (the prescribing physicians) to request that we send another prescription to another pharmacy to get the medicine.   This reordering of a controlled  substance creates documentation problems where additional paperwork needs to be created to explain why two prescriptions for the same period of time and the same medicine are being prescribed to the same patient. It also creates situations where the last appointment note does not accurately reflect when and what prescriptions were given to a patient. This leads to prescribing errors down the line, in subsequent follow-up visits.   [employer] of Pharmacy ([employer]) Research revealed that Surveyor, quantity .1806 (21 NCAC 46.1806) authorizes pharmacists to the transfer of prescriptions among pharmacies, and it sets forth procedural and recordkeeping requirements for doing so. However, this requires the pharmacist to complete the previously mentioned procedural paperwork to accomplish the transfer. As it turns out, it is much easier for them to have the prescribing physicians do the work.   Possible solutions 1. You can ask your physician to assist you in weaning yourself off these medications. 2. Ask your pharmacy if the medication is in stock, 3 days prior to your refill. 3. If you need a pharmacy change, let us know at your medication management visit. Prescriptions that have already been electronically sent to a pharmacy will not be re-sent to a different pharmacy if your pharmacy of record does not have it in stock. Proper stocking of medication is a pharmacy problem, not a prescriber problem. Work with your pharmacist to solve the problem. 4. Have the [facility] Assembly add a provision to the "STOP ACT" (the law that mandates how controlled substances are prescribed) where there is an exception to the electronic prescribing rule that states that in  the event there are shortages of medications the physicians are allowed to use written prescriptions as opposed to electronic ones. This would allow patients to take their prescriptions to a different pharmacy that may have enough  medication available to fill the prescription. The problem is that currently there is a law that does not allow for written prescriptions, with the exception of instances where the electronic medical record is down due to technical issues.  5. Have [place] Congress ease the pressure on pharmaceutical companies, allowing them to produce enough quantities of the medication to adequately supply the population. 6. Have pharmacies keep enough stocks of these medications to cover their client base.  7. Have the [facility] Assembly add a provision to the "STOP ACT" where they ease the regulations surrounding the transfer of controlled substances between pharmacies, so as to simplify the transfer of supplies. As an alternative, develop a system to allow patients to obtain the remainder of their prescription at another one of their pharmacies or at an associate pharmacy.   How this shortage will affect you.  Understand that this is a pharmacy supply problem, not a prescriber problem. Work with your pharmacy to solve it. The job of the prescriber is to evaluate and monitor the patient for the appropriate indications and use of these medicines. It is not the job of the prescriber to supply the medication or to solve problems with that supply. The responsibility and the choice to obtain the medication resides on the patient. By law, supplying the medication is the job of the pharmacy. It is certainly not the job of the prescriber to solve supply problems.   Due to the above problems we are no longer taking patients to write for their pain medication. Future discussions with your physician may include potentially weaning medications or transitioning to alternatives.  We will be focusing primarily on interventional based pain management. We will continue to evaluate for appropriate indications and we may provide recommendations regarding medication, dose, and schedule, as well as monitoring recommendations, however,  we will not be taking over the actual prescribing of these substances. On those patients where we are treating their chronic pain with interventional therapies, exceptions will be considered on a case by case basis. At this time, we will try to continue providing this supplemental service to those patients we have been managing in the past. However, as of August 1st, 2023, we no longer will be sending additional prescriptions to other pharmacies for the purpose of solving their supply problems. Once we send a prescription to a pharmacy, we will not be resending it again to another pharmacy to cover for their shortages.   What to do. Write as many letters as you can. Recruit the help of family members in writing these letters. Below are some of the places where you can write to make your voice heard. Let them know what the problem is and push them to look for solutions.   Search internet for: "[employer] find your legislators" NoseSwap.is  Search internet for: "[employer] commissioner complaints" Starlas.fi  Search internet for: "[place] complaints" https://www.hernandez-brewer.com/.htm  Search internet for: "[facility] pharmacy complaints" Email [facility] Customer Relations woondaal.com.jsp?callType=store  Search internet for: Programme researcher, broadcasting/film/video customer service complaints" https://www.[facility].com/topic/marketing/contactus/contactus_customerservice.jsp  ____________________________________________________________________________________________  ____________________________________________________________________________________________  Drug Holidays (Slow)  What is a "Drug Holiday"? Drug Holiday: is the name given to the period of time during which a patient stops taking a medication(s) for the purpose of eliminating  tolerance  to the drug.  Benefits Improved effectiveness of opioids. Decreased opioid dose needed to achieve benefits. Improved pain with lesser dose.  What is tolerance? Tolerance: is the progressive decreased in effectiveness of a drug due to its repetitive use. With repetitive use, the body gets use to the medication and as a consequence, it loses its effectiveness. This is a common problem seen with opioid pain medications. As a result, a larger dose of the drug is needed to achieve the same effect that used to be obtained with a smaller dose.  How long should a "Drug Holiday" last? You should stay off of the pain medicine for at least 14 consecutive days. (2 weeks)  Should I stop the medicine "cold [place]"? No. You should always coordinate with your Pain Specialist so that he/she can provide you with the correct medication dose to make the transition as smoothly as possible.  How do I stop the medicine? Slowly. You will be instructed to decrease the daily amount of pills that you take by one (1) pill every seven (7) days. This is called a "slow downward taper" of your dose. For example: if you normally take four (4) pills per day, you will be asked to drop this dose to three (3) pills per day for seven (7) days, then to two (2) pills per day for seven (7) days, then to one (1) per day for seven (7) days, and at the end of those last seven (7) days, this is when the "Drug Holiday" would start.   Will I have withdrawals? By doing a "slow downward taper" like this one, it is unlikely that you will experience any significant withdrawal symptoms. Typically, what triggers withdrawals is the sudden stop of a high dose opioid therapy. Withdrawals can usually be avoided by slowly decreasing the dose over a prolonged period of time. If you do not follow these instructions and decide to stop your medication abruptly, withdrawals may be possible.  What are withdrawals? Withdrawals: refers to the wide range of  symptoms that occur after stopping or dramatically reducing opiate drugs after heavy and prolonged use. Withdrawal symptoms do not occur to patients that use low dose opioids, or those who take the medication sporadically. Contrary to benzodiazepine (example: Valium, Xanax, etc.) or alcohol withdrawals ("Delirium Tremens"), opioid withdrawals are not lethal. Withdrawals are the physical manifestation of the body getting rid of the excess receptors.  Expected Symptoms Early symptoms of withdrawal may include: Agitation Anxiety Muscle aches Increased tearing Insomnia Runny nose Sweating Yawning  Late symptoms of withdrawal may include: Abdominal cramping Diarrhea Dilated pupils Goose bumps Nausea Vomiting  Will I experience withdrawals? Due to the slow nature of the taper, it is very unlikely that you will experience any.  What is a slow taper? Taper: refers to the gradual decrease in dose.  (Last update: 08/28/2019) ____________________________________________________________________________________________   ____________________________________________________________________________________________  CBD (cannabidiol) & Delta-8 (Delta-8 tetrahydrocannabinol) WARNING  Intro: Cannabidiol (CBD) and tetrahydrocannabinol (THC), are two natural compounds found in plants of the Cannabis genus. They can both be extracted from hemp or cannabis. Hemp and cannabis come from the Cannabis sativa plant. Both compounds interact with your body's endocannabinoid system, but they have very different effects. CBD does not produce the high sensation associated with cannabis. Delta-8 tetrahydrocannabinol, also known as delta-8 THC, is a psychoactive substance found in the Cannabis sativa plant, of which marijuana and hemp are two varieties. THC is responsible for the high associated with the illicit use of marijuana.  Applicable to: All individuals currently taking or considering taking CBD  (cannabidiol) and, more important, all patients taking opioid analgesic controlled substances (pain medication). (Example: oxycodone; oxymorphone; hydrocodone; hydromorphone; morphine; methadone; tramadol; tapentadol; fentanyl; buprenorphine; butorphanol; dextromethorphan; meperidine; codeine; etc.)  Legal status: CBD remains a Schedule I drug prohibited for any use. CBD is illegal with one exception. In the [place], CBD has a limited Transport planner (FDA) approval for the treatment of two specific types of epilepsy disorders. Only one CBD product has been approved by the FDA for this purpose: "Epidiolex". FDA is aware that some companies are marketing products containing cannabis and cannabis-derived compounds in ways that violate the [employer], Drug and Cosmetic Act [facility] Act) and that may put the health and safety of consumers at risk. The FDA, a Federal agency, has not enforced the CBD status since 2018. UPDATE: (03/26/2021) The Drug Enforcement Agency ([place]) issued a letter stating that "delta" cannabinoids, including Delta-8-THCO and Delta-9-THCO, synthetically derived from hemp do not qualify as hemp and will be viewed as Schedule I drugs. (Schedule I drugs, substances, or chemicals are defined as drugs with no currently accepted medical use and a high potential for abuse. Some examples of Schedule I drugs are: heroin, lysergic acid diethylamide (LSD), marijuana (cannabis), 3,4-methylenedioxymethamphetamine (ecstasy), methaqualone, and peyote.) (https://jennings.com/)  Legality: Some manufacturers ship CBD products nationally, which is illegal. Often such products are sold online and are therefore available throughout the country. CBD is openly sold in head shops and health food stores in some states where such sales have not been explicitly legalized. Selling unapproved products with unsubstantiated therapeutic claims is not only a violation of the law, but also can put patients at  risk, as these products have not been proven to be safe or effective. Federal illegality makes it difficult to conduct research on CBD.  Reference: "FDA Regulation of Cannabis and Cannabis-Derived Products, Including Cannabidiol (CBD)" - [employer].pt  Warning: CBD is not FDA approved and has not undergo the same manufacturing controls as prescription drugs.  This means that the purity and safety of available CBD may be questionable. Most of the time, despite manufacturer's claims, it is contaminated with THC (delta-9-tetrahydrocannabinol - the chemical in marijuana responsible for the "HIGH").  When this is the case, the [facility] contaminant will trigger a positive urine drug screen (UDS) test for Marijuana (carboxy-THC). Because a positive UDS for any illicit substance is a violation of our medication agreement, your opioid analgesics (pain medicine) may be permanently discontinued. The FDA recently put out a warning about 5 things that everyone should be aware of regarding Delta-8 THC: Delta-8 THC products have not been evaluated or approved by the FDA for safe use and may be marketed in ways that put the public health at risk. The FDA has received adverse event reports involving delta-8 THC-containing products. Delta-8 THC has psychoactive and intoxicating effects. Delta-8 THC manufacturing often involve use of potentially harmful chemicals to create the concentrations of delta-8 THC claimed in the marketplace. The final delta-8 THC product may have potentially harmful by-products (contaminants) due to the chemicals used in the process. Manufacturing of delta-8 THC products may occur in uncontrolled or unsanitary settings, which may lead to the presence of unsafe contaminants or other potentially harmful substances. Delta-8 THC products should be kept out of the reach of children and  pets.  MORE ABOUT CBD  General Information: CBD was discovered in [birth year] and it is a derivative of the cannabis sativa genus plants (Marijuana  and Hemp). It is one of the 113 identified substances found in Marijuana. It accounts for up to 40% of the plant's extract. As of 2018, preliminary clinical studies on CBD included research for the treatment of anxiety, movement disorders, and pain. CBD is available and consumed in multiple forms, including inhalation of smoke or vapor, as an aerosol spray, and by mouth. It may be supplied as an oil containing CBD, capsules, dried cannabis, or as a liquid solution. CBD is thought not to be as psychoactive as THC (delta-9-tetrahydrocannabinol - the chemical in marijuana responsible for the "HIGH"). Studies suggest that CBD may interact with different biological target receptors in the body, including cannabinoid and other neurotransmitter receptors. As of 2018 the mechanism of action for its biological effects has not been determined.  Side-effects  Adverse reactions: Dry mouth, diarrhea, decreased appetite, fatigue, drowsiness, malaise, weakness, sleep disturbances, and others.  Drug interactions: CBC may interact with other medications such as blood-thinners. Because CBD causes drowsiness on its own, it also increases the drowsiness caused by other medications, including antihistamines (such as Benadryl), benzodiazepines (Xanax, Ativan, Valium), antipsychotics, antidepressants and opioids, as well as alcohol and supplements such as kava, melatonin and St. John's Wort. Be cautious with the following combinations:   Brivaracetam (Briviact) Brivaracetam is changed and broken down by the body. CBD might decrease how quickly the body breaks down brivaracetam. This might increase levels of brivaracetam in the body.  Caffeine Caffeine is changed and broken down by the body. CBD might decrease how quickly the body breaks down caffeine. This might increase levels of  caffeine in the body.  Carbamazepine (Tegretol) Carbamazepine is changed and broken down by the body. CBD might decrease how quickly the body breaks down carbamazepine. This might increase levels of carbamazepine in the body and increase its side effects.  Citalopram (Celexa) Citalopram is changed and broken down by the body. CBD might decrease how quickly the body breaks down citalopram. This might increase levels of citalopram in the body and increase its side effects.  Clobazam (Onfi) Clobazam is changed and broken down by the liver. CBD might decrease how quickly the liver breaks down clobazam. This might increase the effects and side effects of clobazam.  Eslicarbazepine (Aptiom) Eslicarbazepine is changed and broken down by the body. CBD might decrease how quickly the body breaks down eslicarbazepine. This might increase levels of eslicarbazepine in the body by a small amount.  Everolimus (Zostress) Everolimus is changed and broken down by the body. CBD might decrease how quickly the body breaks down everolimus. This might increase levels of everolimus in the body.  Lithium Taking higher doses of CBD might increase levels of lithium. This can increase the risk of lithium toxicity.  Medications changed by the liver (Cytochrome P450 1A1 (CYP1A1) substrates) Some medications are changed and broken down by the liver. CBD might change how quickly the liver breaks down these medications. This could change the effects and side effects of these medications.  Medications changed by the liver (Cytochrome P450 1A2 (CYP1A2) substrates) Some medications are changed and broken down by the liver. CBD might change how quickly the liver breaks down these medications. This could change the effects and side effects of these medications.  Medications changed by the liver (Cytochrome P450 1B1 (CYP1B1) substrates) Some medications are changed and broken down by the liver. CBD might change how quickly the  liver breaks down these medications. This could change the effects and side effects of these medications.  Medications changed  by the liver (Cytochrome P450 2A6 (CYP2A6) substrates) Some medications are changed and broken down by the liver. CBD might change how quickly the liver breaks down these medications. This could change the effects and side effects of these medications.  Medications changed by the liver (Cytochrome P450 2B6 (CYP2B6) substrates) Some medications are changed and broken down by the liver. CBD might change how quickly the liver breaks down these medications. This could change the effects and side effects of these medications.  Medications changed by the liver (Cytochrome P450 2C19 (CYP2C19) substrates) Some medications are changed and broken down by the liver. CBD might change how quickly the liver breaks down these medications. This could change the effects and side effects of these medications.  Medications changed by the liver (Cytochrome P450 2C8 (CYP2C8) substrates) Some medications are changed and broken down by the liver. CBD might change how quickly the liver breaks down these medications. This could change the effects and side effects of these medications.  Medications changed by the liver (Cytochrome P450 2C9 (CYP2C9) substrates) Some medications are changed and broken down by the liver. CBD might change how quickly the liver breaks down these medications. This could change the effects and side effects of these medications.  Medications changed by the liver (Cytochrome P450 2D6 (CYP2D6) substrates) Some medications are changed and broken down by the liver. CBD might change how quickly the liver breaks down these medications. This could change the effects and side effects of these medications.  Medications changed by the liver (Cytochrome P450 2E1 (CYP2E1) substrates) Some medications are changed and broken down by the liver. CBD might change how quickly the liver  breaks down these medications. This could change the effects and side effects of these medications.  Medications changed by the liver (Cytochrome P450 3A4 (CYP3A4) substrates) Some medications are changed and broken down by the liver. CBD might change how quickly the liver breaks down these medications. This could change the effects and side effects of these medications.  Medications changed by the liver (Glucuronidated drugs) Some medications are changed and broken down by the liver. CBD might change how quickly the liver breaks down these medications. This could change the effects and side effects of these medications.  Medications that decrease the breakdown of other medications by the liver (Cytochrome P450 2C19 (CYP2C19) inhibitors) CBD is changed and broken down by the liver. Some drugs decrease how quickly the liver changes and breaks down CBD. This could change the effects and side effects of CBD.  Medications that decrease the breakdown of other medications in the liver (Cytochrome P450 3A4 (CYP3A4) inhibitors) CBD is changed and broken down by the liver. Some drugs decrease how quickly the liver changes and breaks down CBD. This could change the effects and side effects of CBD.  Medications that increase breakdown of other medications by the liver (Cytochrome P450 3A4 (CYP3A4) inducers) CBD is changed and broken down by the liver. Some drugs increase how quickly the liver changes and breaks down CBD. This could change the effects and side effects of CBD.  Medications that increase the breakdown of other medications by the liver (Cytochrome P450 2C19 (CYP2C19) inducers) CBD is changed and broken down by the liver. Some drugs increase how quickly the liver changes and breaks down CBD. This could change the effects and side effects of CBD.  Methadone (Dolophine) Methadone is broken down by the liver. CBD might decrease how quickly the liver breaks down methadone. Taking cannabidiol along  with methadone might  increase the effects and side effects of methadone.  Rufinamide (Banzel) Rufinamide is changed and broken down by the body. CBD might decrease how quickly the body breaks down rufinamide. This might increase levels of rufinamide in the body by a small amount.  Sedative medications (CNS depressants) CBD might cause sleepiness and slowed breathing. Some medications, called sedatives, can also cause sleepiness and slowed breathing. Taking CBD with sedative medications might cause breathing problems and/or too much sleepiness.  Sirolimus (Rapamune) Sirolimus is changed and broken down by the body. CBD might decrease how quickly the body breaks down sirolimus. This might increase levels of sirolimus in the body.  Stiripentol (Diacomit) Stiripentol is changed and broken down by the body. CBD might decrease how quickly the body breaks down stiripentol. This might increase levels of stiripentol in the body and increase its side effects.  Tacrolimus (Prograf) Tacrolimus is changed and broken down by the body. CBD might decrease how quickly the body breaks down tacrolimus. This might increase levels of tacrolimus in the body.  Tamoxifen (Soltamox) Tamoxifen is changed and broken down by the body. CBD might affect how quickly the body breaks down tamoxifen. This might affect levels of tamoxifen in the body.  Topiramate (Topamax) Topiramate is changed and broken down by the body. CBD might decrease how quickly the body breaks down topiramate. This might increase levels of topiramate in the body by a small amount.  Valproate Valproic acid can cause liver injury. Taking cannabidiol with valproic acid might increase the chance of liver injury. CBD and/or valproic acid might need to be stopped, or the dose might need to be reduced.  Warfarin (Coumadin) CBD might increase levels of warfarin, which can increase the risk for bleeding. CBD and/or warfarin might need to be stopped, or the  dose might need to be reduced.  Zonisamide Zonisamide is changed and broken down by the body. CBD might decrease how quickly the body breaks down zonisamide. This might increase levels of zonisamide in the body by a small amount. (Last update: 04/07/2021) ____________________________________________________________________________________________

## 2021-11-24 ENCOUNTER — Ambulatory Visit: Payer: No Typology Code available for payment source | Attending: Pain Medicine | Admitting: Pain Medicine

## 2021-11-24 ENCOUNTER — Encounter: Payer: Self-pay | Admitting: Pain Medicine

## 2021-11-24 ENCOUNTER — Other Ambulatory Visit: Payer: Self-pay

## 2021-11-24 VITALS — BP 112/67 | HR 92 | Temp 97.2°F | Resp 18 | Ht 64.0 in | Wt 186.7 lb

## 2021-11-24 DIAGNOSIS — G894 Chronic pain syndrome: Secondary | ICD-10-CM | POA: Insufficient documentation

## 2021-11-24 DIAGNOSIS — M79641 Pain in right hand: Secondary | ICD-10-CM | POA: Diagnosis present

## 2021-11-24 DIAGNOSIS — M47819 Spondylosis without myelopathy or radiculopathy, site unspecified: Secondary | ICD-10-CM | POA: Insufficient documentation

## 2021-11-24 DIAGNOSIS — Z79899 Other long term (current) drug therapy: Secondary | ICD-10-CM | POA: Insufficient documentation

## 2021-11-24 DIAGNOSIS — G8929 Other chronic pain: Secondary | ICD-10-CM | POA: Diagnosis present

## 2021-11-24 DIAGNOSIS — M5137 Other intervertebral disc degeneration, lumbosacral region: Secondary | ICD-10-CM | POA: Insufficient documentation

## 2021-11-24 DIAGNOSIS — M25552 Pain in left hip: Secondary | ICD-10-CM | POA: Insufficient documentation

## 2021-11-24 DIAGNOSIS — G893 Neoplasm related pain (acute) (chronic): Secondary | ICD-10-CM | POA: Insufficient documentation

## 2021-11-24 DIAGNOSIS — M25551 Pain in right hip: Secondary | ICD-10-CM | POA: Insufficient documentation

## 2021-11-24 DIAGNOSIS — M25561 Pain in right knee: Secondary | ICD-10-CM | POA: Diagnosis present

## 2021-11-24 DIAGNOSIS — M2540 Effusion, unspecified joint: Secondary | ICD-10-CM | POA: Diagnosis present

## 2021-11-24 DIAGNOSIS — M79671 Pain in right foot: Secondary | ICD-10-CM | POA: Insufficient documentation

## 2021-11-24 DIAGNOSIS — R937 Abnormal findings on diagnostic imaging of other parts of musculoskeletal system: Secondary | ICD-10-CM | POA: Diagnosis present

## 2021-11-24 DIAGNOSIS — M545 Low back pain, unspecified: Secondary | ICD-10-CM | POA: Diagnosis present

## 2021-11-24 DIAGNOSIS — M4316 Spondylolisthesis, lumbar region: Secondary | ICD-10-CM | POA: Diagnosis present

## 2021-11-24 DIAGNOSIS — M25562 Pain in left knee: Secondary | ICD-10-CM | POA: Insufficient documentation

## 2021-11-24 DIAGNOSIS — M47816 Spondylosis without myelopathy or radiculopathy, lumbar region: Secondary | ICD-10-CM | POA: Diagnosis present

## 2021-11-24 DIAGNOSIS — M79672 Pain in left foot: Secondary | ICD-10-CM | POA: Insufficient documentation

## 2021-11-24 DIAGNOSIS — M542 Cervicalgia: Secondary | ICD-10-CM | POA: Diagnosis present

## 2021-11-24 DIAGNOSIS — M79642 Pain in left hand: Secondary | ICD-10-CM | POA: Diagnosis present

## 2021-11-24 DIAGNOSIS — Z79891 Long term (current) use of opiate analgesic: Secondary | ICD-10-CM | POA: Diagnosis present

## 2021-11-24 MED ORDER — OXYCODONE HCL 5 MG PO TABS: 5.0000 mg | ORAL_TABLET | Freq: Two times a day (BID) | ORAL | 0 refills | Status: DC | PRN

## 2021-11-24 MED ORDER — NALOXONE HCL 4 MG/0.1ML NA LIQD: 1.0000 | NASAL | 0 refills | Status: DC | PRN

## 2021-11-24 MED ORDER — OXYCODONE HCL 5 MG PO TABS
5.0000 mg | ORAL_TABLET | Freq: Two times a day (BID) | ORAL | 0 refills | Status: DC | PRN
Start: 2021-12-31 — End: 2022-02-21

## 2021-11-24 NOTE — Progress Notes (Signed)
Nursing Pain Medication Assessment:  Safety precautions to be maintained throughout the outpatient stay will include: orient to surroundings, keep bed in low position, maintain call bell within reach at all times, provide assistance with transfer out of bed and ambulation.  Medication Inspection Compliance: Pill count conducted under aseptic conditions, in front of the patient. Neither the pills nor the bottle was removed from the patient's sight at any time. Once count was completed pills were immediately returned to the patient in their original bottle.  Medication: Oxycodone IR Pill/Patch Count:  52 of 60 pills remain Pill/Patch Appearance: Markings consistent with prescribed medication Bottle Appearance: Standard pharmacy container. Clearly labeled. Filled Date: 10 / 14 / 2023 Last Medication intake:  Today

## 2021-11-26 ENCOUNTER — Other Ambulatory Visit: Payer: Self-pay

## 2021-11-26 DIAGNOSIS — Z170 Estrogen receptor positive status [ER+]: Secondary | ICD-10-CM

## 2021-11-28 NOTE — Patient Instructions (Signed)
Diabetes Mellitus Basics  Diabetes mellitus, or diabetes, is a long-term (chronic) disease. It occurs when the body does not properly use sugar (glucose) that is released from food after you eat. Diabetes mellitus may be caused by one or both of these problems: Your pancreas does not make enough of a hormone called insulin. Your body does not react in a normal way to the insulin that it makes. Insulin lets glucose enter cells in your body. This gives you energy. If you have diabetes, glucose cannot get into cells. This causes high blood glucose (hyperglycemia). How to treat and manage diabetes You may need to take insulin or other diabetes medicines daily to keep your glucose in balance. If you are prescribed insulin, you will learn how to give yourself insulin by injection. You may need to adjust the amount of insulin you take based on the foods that you eat. You will need to check your blood glucose levels using a glucose monitor as told by your health care provider. The readings can help determine if you have low or high blood glucose. Generally, you should have these blood glucose levels: Before meals (preprandial): 80-130 mg/dL (4.4-7.2 mmol/L). After meals (postprandial): below 180 mg/dL (10 mmol/L). Hemoglobin A1c (HbA1c) level: less than 7%. Your health care provider will set treatment goals for you. Keep all follow-up visits. This is important. Follow these instructions at home: Diabetes medicines Take your diabetes medicines every day as told by your health care provider. List your diabetes medicines here: Name of medicine: ______________________________ Amount (dose): _______________ Time (a.m./p.m.): _______________ Notes: ___________________________________ Name of medicine: ______________________________ Amount (dose): _______________ Time (a.m./p.m.): _______________ Notes: ___________________________________ Name of medicine: ______________________________ Amount (dose):  _______________ Time (a.m./p.m.): _______________ Notes: ___________________________________ Insulin If you use insulin, list the types of insulin you use here: Insulin type: ______________________________ Amount (dose): _______________ Time (a.m./p.m.): _______________Notes: ___________________________________ Insulin type: ______________________________ Amount (dose): _______________ Time (a.m./p.m.): _______________ Notes: ___________________________________ Insulin type: ______________________________ Amount (dose): _______________ Time (a.m./p.m.): _______________ Notes: ___________________________________ Insulin type: ______________________________ Amount (dose): _______________ Time (a.m./p.m.): _______________ Notes: ___________________________________ Insulin type: ______________________________ Amount (dose): _______________ Time (a.m./p.m.): _______________ Notes: ___________________________________ Managing blood glucose  Check your blood glucose levels using a glucose monitor as told by your health care provider. Write down the times that you check your glucose levels here: Time: _______________ Notes: ___________________________________ Time: _______________ Notes: ___________________________________ Time: _______________ Notes: ___________________________________ Time: _______________ Notes: ___________________________________ Time: _______________ Notes: ___________________________________ Time: _______________ Notes: ___________________________________  Low blood glucose Low blood glucose (hypoglycemia) is when glucose is at or below 70 mg/dL (3.9 mmol/L). Symptoms may include: Feeling: Hungry. Sweaty and clammy. Irritable or easily upset. Dizzy. Sleepy. Having: A fast heartbeat. A headache. A change in your vision. Numbness around the mouth, lips, or tongue. Having trouble with: Moving (coordination). Sleeping. Treating low blood glucose To treat low blood  glucose, eat or drink something containing sugar right away. If you can think clearly and swallow safely, follow the 15:15 rule: Take 15 grams of a fast-acting carb (carbohydrate), as told by your health care provider. Some fast-acting carbs are: Glucose tablets: take 3-4 tablets. Hard candy: eat 3-5 pieces. Fruit juice: drink 4 oz (120 mL). Regular (not diet) soda: drink 4-6 oz (120-180 mL). Honey or sugar: eat 1 Tbsp (15 mL). Check your blood glucose levels 15 minutes after you take the carb. If your glucose is still at or below 70 mg/dL (3.9 mmol/L), take 15 grams of a carb again. If your glucose does not go above 70 mg/dL (3.9 mmol/L) after   3 tries, get help right away. After your glucose goes back to normal, eat a meal or a snack within 1 hour. Treating very low blood glucose If your glucose is at or below 54 mg/dL (3 mmol/L), you have very low blood glucose (severe hypoglycemia). This is an emergency. Do not wait to see if the symptoms will go away. Get medical help right away. Call your local emergency services (911 in the [place] Do not drive yourself to the hospital. Questions to ask your health care provider Should I talk with a diabetes educator? What equipment will I need to care for myself at home? What diabetes medicines do I need? When should I take them? How often do I need to check my blood glucose levels? What number can I call if I have questions? When is my follow-up visit? Where can I find a support group for people with diabetes? Where to find more information American Diabetes Association: www.diabetes.org Association of Diabetes Care and Education Specialists: www.diabeteseducator.org Contact a health care provider if: Your blood glucose is at or above 240 mg/dL (13.3 mmol/L) for 2 days in a row. You have been sick or have had a fever for 2 days or more, and you are not getting better. You have any of these problems for more than 6 hours: You cannot eat or  drink. You feel nauseous. You vomit. You have diarrhea. Get help right away if: Your blood glucose is lower than 54 mg/dL (3 mmol/L). You get confused. You have trouble thinking clearly. You have trouble breathing. These symptoms may represent a serious problem that is an emergency. Do not wait to see if the symptoms will go away. Get medical help right away. Call your local emergency services (911 in the [place] Do not drive yourself to the hospital. Summary Diabetes mellitus is a chronic disease that occurs when the body does not properly use sugar (glucose) that is released from food after you eat. Take insulin and diabetes medicines as told. Check your blood glucose every day, as often as told. Keep all follow-up visits. This is important. This information is not intended to replace advice given to you by your health care provider. Make sure you discuss any questions you have with your health care provider. Document Revised: 05/28/2019 Document Reviewed: 05/28/2019 Elsevier Patient Education  2023 Elsevier Inc.  

## 2021-11-29 ENCOUNTER — Inpatient Hospital Stay: Payer: No Typology Code available for payment source | Attending: Internal Medicine

## 2021-11-29 ENCOUNTER — Encounter: Payer: Self-pay | Admitting: Internal Medicine

## 2021-11-29 ENCOUNTER — Inpatient Hospital Stay (HOSPITAL_BASED_OUTPATIENT_CLINIC_OR_DEPARTMENT_OTHER): Payer: No Typology Code available for payment source | Admitting: Internal Medicine

## 2021-11-29 ENCOUNTER — Inpatient Hospital Stay: Payer: No Typology Code available for payment source

## 2021-11-29 ENCOUNTER — Other Ambulatory Visit: Payer: Self-pay

## 2021-11-29 DIAGNOSIS — Z5111 Encounter for antineoplastic chemotherapy: Secondary | ICD-10-CM | POA: Insufficient documentation

## 2021-11-29 DIAGNOSIS — Z79899 Other long term (current) drug therapy: Secondary | ICD-10-CM | POA: Diagnosis not present

## 2021-11-29 DIAGNOSIS — D649 Anemia, unspecified: Secondary | ICD-10-CM | POA: Diagnosis not present

## 2021-11-29 DIAGNOSIS — C50512 Malignant neoplasm of lower-outer quadrant of left female breast: Secondary | ICD-10-CM | POA: Diagnosis present

## 2021-11-29 DIAGNOSIS — Z95828 Presence of other vascular implants and grafts: Secondary | ICD-10-CM

## 2021-11-29 DIAGNOSIS — Z170 Estrogen receptor positive status [ER+]: Secondary | ICD-10-CM | POA: Insufficient documentation

## 2021-11-29 LAB — COMPREHENSIVE METABOLIC PANEL
ALT: 12 U/L (ref 0–44)
AST: 20 U/L (ref 15–41)
Albumin: 3.6 g/dL (ref 3.5–5.0)
Alkaline Phosphatase: 49 U/L (ref 38–126)
Anion gap: 7 (ref 5–15)
BUN: 14 mg/dL (ref 6–20)
CO2: 26 mmol/L (ref 22–32)
Calcium: 9 mg/dL (ref 8.9–10.3)
Chloride: 105 mmol/L (ref 98–111)
Creatinine, Ser: 1 mg/dL (ref 0.44–1.00)
GFR, Estimated: >60 mL/min (ref >60)
Glucose, Bld: 123 mg/dL — ABNORMAL HIGH (ref 70–99)
Potassium: 3.7 mmol/L (ref 3.5–5.1)
Sodium: 138 mmol/L (ref 135–145)
Total Bilirubin: 0.4 mg/dL (ref 0.3–1.2)
Total Protein: 6.6 g/dL (ref 6.5–8.1)

## 2021-11-29 LAB — CBC WITH DIFFERENTIAL/PLATELET
Abs Immature Granulocytes: 0.01 10*3/uL (ref 0.00–0.07)
Basophils Absolute: 0 10*3/uL (ref 0.0–0.1)
Basophils Relative: 1 %
Eosinophils Absolute: 0.1 10*3/uL (ref 0.0–0.5)
Eosinophils Relative: 1 %
HCT: 36.3 % (ref 36.0–46.0)
Hemoglobin: 11.8 g/dL — ABNORMAL LOW (ref 12.0–15.0)
Immature Granulocytes: 0 %
Lymphocytes Relative: 27 %
Lymphs Abs: 2.1 10*3/uL (ref 0.7–4.0)
MCH: 31.8 pg (ref 26.0–34.0)
MCHC: 32.5 g/dL (ref 30.0–36.0)
MCV: 97.8 fL (ref 80.0–100.0)
Monocytes Absolute: 0.4 10*3/uL (ref 0.1–1.0)
Monocytes Relative: 5 %
Neutro Abs: 5.1 10*3/uL (ref 1.7–7.7)
Neutrophils Relative %: 66 %
Platelets: 174 10*3/uL (ref 150–400)
RBC: 3.71 MIL/uL — ABNORMAL LOW (ref 3.87–5.11)
RDW: 13.2 % (ref 11.5–15.5)
WBC: 7.8 10*3/uL (ref 4.0–10.5)
nRBC: 0 % (ref 0.0–0.2)

## 2021-11-29 LAB — IRON AND TIBC
Iron: 55 ug/dL (ref 28–170)
Saturation Ratios: 19 % (ref 10.4–31.8)
TIBC: 297 ug/dL (ref 250–450)
UIBC: 242 ug/dL

## 2021-11-29 LAB — FERRITIN: Ferritin: 136 ng/mL (ref 11–307)

## 2021-11-29 MED ORDER — GOSERELIN ACETATE 10.8 MG ~~LOC~~ IMPL
10.8000 mg | DRUG_IMPLANT | Freq: Once | SUBCUTANEOUS | Status: DC
  Filled 2021-11-29: qty 10.8

## 2021-11-29 MED ORDER — GOSERELIN ACETATE 10.8 MG ~~LOC~~ IMPL
10.8000 mg | DRUG_IMPLANT | Freq: Once | SUBCUTANEOUS | Status: DC
  Filled 2021-11-29 (×2): qty 10.8

## 2021-11-29 MED ORDER — GOSERELIN ACETATE 10.8 MG ~~LOC~~ IMPL
10.8000 mg | DRUG_IMPLANT | Freq: Once | SUBCUTANEOUS | Status: AC
  Administered 2021-11-29: 10.8 mg via SUBCUTANEOUS
  Filled 2021-11-29 (×2): qty 10.8

## 2021-11-29 MED ORDER — HEPARIN SOD (PORK) LOCK FLUSH 100 UNIT/ML IV SOLN
500.0000 [IU] | Freq: Once | INTRAVENOUS | Status: AC
  Administered 2021-11-29: 500 [IU] via INTRAVENOUS
  Filled 2021-11-29: qty 5

## 2021-11-29 MED ORDER — SODIUM CHLORIDE 0.9% FLUSH
10.0000 mL | Freq: Once | INTRAVENOUS | Status: AC
  Administered 2021-11-29: 10 mL via INTRAVENOUS
  Filled 2021-11-29: qty 10

## 2021-11-29 NOTE — Progress Notes (Signed)
Patient is concerned with memory and forgetfulness that started 3-4 months ago.

## 2021-11-29 NOTE — Progress Notes (Signed)
[facility] CONSULT NOTE  Patient Care Team: [name], NP as PCP - General (Nurse Practitioner) Bary Castilla [name], MD (General Surgery) [name], [name], [name] (Obstetrics and Gynecology) Cammie Sickle, MD as Consulting Physician (Oncology) Lovell Sheehan, MD as Consulting Physician (Orthopedic Surgery)  CHIEF COMPLAINTS/PURPOSE OF CONSULTATION: Breast cancer  Oncology History Overview Note  # MAY 2019-  clinical stage IIIA (T3N1Mx) left breast cancer s/p biopsy on 06/14/2017. -Pathology revealed grade III invasive ductal carcinoma. -Axillary FNA revealed malignant cells c/w metastatic carcinoma. Tumor was ER + (90%), PR + (30%), Her2/neu - and Ki67 70%.  CA27.29 was 7.8 on 06/14/2017.  # She received 4 cycles of AC with Neulasta support (07/20/2017 - 08/31/2017).;  neoadjuvant Taxol on 09/14/2017.  #DEC 2019- Lumpectomy/sentinel lymph node biopsy [Dr.[name]]-complete pathologic response  # s/p RT [delayed sec to wound infection; Dr.[name]] finished RT [4/12]  # April 14th 2020- START TAM; stopped in mid-May secondary intolerance [severe migraines].  # 18th May 2020-start Arimidex [hormonal profile-postmenopausal;add Zoladex q3M]; STOPPED sec intolerance/joint pain; NOV 2021- STARTED AROMASIN; Stopped x 2 months sec to extreme fatigue/severe joint pains.   # July 2022- START tamoxifen 10 mg a day.  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 Secord/Berchuck]-negative for malignancy/ # DM-2- poorly controlled.   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   # PAP SMEAR- RECOMMENDED 2022- summer  -------------------------------------------  DIAGNOSIS: left breast cancer  STAGE:  III       ;GOALS: cure  CURRENT/MOST RECENT THERAPY Tam    Cancer of midline of breast, left (HCC) (Resolved)  06/15/2017  Initial Diagnosis   Cancer of midline of breast, left ([place])   07/20/2017 - 11/16/2017 Chemotherapy   The patient had dexamethasone (DECADRON) 4 MG tablet, 1 of 1 cycle, Start date: --, End date: -- DOXOrubicin (ADRIAMYCIN) chemo injection 122 mg, 60 mg/m2 = 122 mg, Intravenous,  Once, 4 of 4 cycles Administration: 122 mg (07/20/2017), 122 mg (08/03/2017), 122 mg (08/17/2017), 122 mg (08/31/2017) palonosetron (ALOXI) injection 0.25 mg, 0.25 mg, Intravenous,  Once, 4 of 4 cycles Administration: 0.25 mg (07/20/2017), 0.25 mg (08/03/2017), 0.25 mg (08/17/2017), 0.25 mg (08/31/2017) pegfilgrastim (NEULASTA) injection 6 mg, 6 mg, Subcutaneous, Once, 5 of 5 cycles Administration: 6 mg (07/21/2017), 6 mg (08/04/2017), 6 mg (08/18/2017), 6 mg (09/01/2017) cyclophosphamide (CYTOXAN) 1,220 mg in sodium chloride 0.9 % 250 mL chemo infusion, 600 mg/m2 = 1,220 mg, Intravenous,  Once, 4 of 4 cycles Administration: 1,220 mg (07/20/2017), 1,220 mg (08/03/2017), 1,220 mg (08/17/2017), 1,220 mg (08/31/2017) PACLitaxel (TAXOL) 162 mg in sodium chloride 0.9 % 250 mL chemo infusion (</= 42m/m2), 80 mg/m2 = 162 mg, Intravenous,  Once, 10 of 12 cycles Dose modification: 65 mg/m2 (original dose 80 mg/m2, Cycle 13, Reason: Provider Judgment, Comment: neuropathy) Administration: 162 mg (09/14/2017), 162 mg (09/21/2017), 162 mg (09/28/2017), 162 mg (10/05/2017), 162 mg (10/12/2017), 162 mg (10/19/2017), 162 mg (10/26/2017), 132 mg (11/02/2017), 132 mg (11/09/2017), 132 mg (11/16/2017) fosaprepitant (EMEND) 150 mg, dexamethasone (DECADRON) 12 mg in sodium chloride 0.9 % 145 mL IVPB, , Intravenous,  Once, 4 of 4 cycles Administration:  (07/20/2017),  (08/03/2017),  (08/17/2017),  (08/31/2017)  for chemotherapy treatment.    Malignant neoplasm of lower-outer quadrant of left breast of female, estrogen receptor positive ([place]  11/08/2017 Initial Diagnosis   Malignant neoplasm of lower-outer quadrant of left  breast of female, estrogen receptor positive  ([place])    HISTORY OF PRESENTING ILLNESS: Ambulating independently.  Alone.  Carolyn Roy 56 y.o.  female with a history of stage III ER PR positive HER-2/neu negative breast cancer currently on tamoxifen 10 mg a day [intolerance] plus Zoladexq3 /Zometa every 6 months is here for follow-up.  Patient is concerned with memory and forgetfulness that started 3-4 months ago.  Multiple episodes-however short-term only.  No seizure-like activity.  Patient denies any new lumps or bumps.  Appetite is good.  No weight loss. Patient has been started on amitriptyline; by neurology.  She continues to be on Cymbalta.    Review of Systems  Constitutional:  Positive for malaise/fatigue. Negative for chills, diaphoresis, fever and weight loss.  HENT:  Negative for nosebleeds and sore throat.   Eyes:  Negative for double vision.  Respiratory:  Negative for cough, hemoptysis, sputum production, shortness of breath and wheezing.   Cardiovascular:  Negative for chest pain, palpitations, orthopnea and leg swelling.  Gastrointestinal:  Negative for abdominal pain, blood in stool, constipation, diarrhea, heartburn, melena, nausea and vomiting.  Genitourinary:  Negative for dysuria, frequency and urgency.  Musculoskeletal:  Positive for back pain and joint pain.  Skin: Negative.  Negative for itching and rash.  Neurological:  Positive for tingling. Negative for dizziness, focal weakness, weakness and headaches.  Endo/Heme/Allergies:  Does not bruise/bleed easily.  Psychiatric/Behavioral:  Negative for depression. The patient is not nervous/anxious and does not have insomnia.      MEDICAL HISTORY:  Past Medical History:  Diagnosis Date   Allergy    Breast cancer ([place])    Cancer ([place]) 06/15/2017   5.1 cm, T3,N1 (clinical): ER/ PR positive, Her 2 neu not overexpressed, High Ki 67. Neuoadjuvant chemotherapy.    Depression    Diabetes mellitus without complication ([place]) 2951   Edema of left upper extremity     Endometriosis    Family history of breast cancer    Headache    migraines   Hyperlipidemia    Lymphedema of left arm    Ovarian mass    Personal history of chemotherapy    Personal history of radiation therapy    Pneumonia    2018    SURGICAL HISTORY: Past Surgical History:  Procedure Laterality Date   AXILLARY LYMPH NODE BIOPSY Left 07/14/2017   Procedure: INSERTION GEL MARK CLIP LEFT AXILLA;  Surgeon: Robert Bellow, MD;  Location: [facility] ORS;  Service: General;  Laterality: Left;   BREAST BIOPSY Left    Dr Orlene Och BREAST METASTATIC CARCINOMA   BREAST LUMPECTOMY Left 01/12/2018   COLONOSCOPY WITH PROPOFOL N/A 12/27/2019   Procedure: COLONOSCOPY WITH PROPOFOL;  Surgeon: Virgel Manifold, MD;  Location: [facility] ENDOSCOPY;  Service: Endoscopy;  Laterality: N/A;   OOPHORECTOMY     PARTIAL MASTECTOMY WITH NEEDLE LOCALIZATION Left 01/12/2018   Procedure: PARTIAL MASTECTOMY WITH NEEDLE LOCALIZATION;  Surgeon: Robert Bellow, MD;  Location: [facility] ORS;  Service: General;  Laterality: Left;   PORTACATH PLACEMENT Right 07/14/2017   Procedure: INSERTION PORT-A-CATH;  Surgeon: Robert Bellow, MD;  Location: [facility] ORS;  Service: General;  Laterality: Right;   SENTINEL NODE BIOPSY Left 01/12/2018   Procedure: SENTINEL NODE BIOPSY;  Surgeon: Robert Bellow, MD;  Location: [facility] ORS;  Service: General;  Laterality: Left;   TUBAL LIGATION      SOCIAL HISTORY: Social History   Socioeconomic History   Marital status: Married    Spouse name: Not on  file   Number of children: Not on file   Years of education: Not on file   Highest education level: Not on file  Occupational History   Not on file  Tobacco Use   Smoking status: Every Day    Packs/day: 1.00    Years: 11.00    Total pack years: 11.00    Types: Cigarettes   Smokeless tobacco: Former    Types: Snuff  Vaping Use   Vaping Use: Never used  Substance and Sexual Activity   Alcohol use: No    Alcohol/week: 0.0  standard drinks of alcohol   Drug use: No   Sexual activity: Yes  Other Topics Concern   Not on file  Social History Narrative   ** Merged History Encounter **       Social Determinants of Health   Financial Resource Strain: Low Risk  (07/03/2019)   Overall Financial Resource Strain (CARDIA)    Difficulty of Paying Living Expenses: Not very hard  Food Insecurity: Not on file  Transportation Needs: Not on file  Physical Activity: Not on file  Stress: Not on file  Social Connections: Not on file  Intimate Partner Violence: Not on file    FAMILY HISTORY: Family History  Problem Relation Age of Onset   Colon cancer Mother    Cancer Mother    Other Father        family hx on dad's side: breast, colon, stomach cancer-biological dad   Diabetes Brother    Pancreatitis Brother    Prostate cancer Brother 76       currently 50 / maternal half-brother   Breast cancer Maternal Aunt 46       currently 83   Breast cancer Maternal Grandmother 76       deceased 41s   Colon cancer Maternal Grandmother    Breast cancer Other 71       mother's sister; deceased 67   Breast cancer Other        mother's sister; age at dx unknown    ALLERGIES:  is allergic to aspirin.  MEDICATIONS:  Current Outpatient Medications  Medication Sig Dispense Refill   amitriptyline (ELAVIL) 50 MG tablet TAKE 1 TABLET BY MOUTH EVERYDAY AT BEDTIME 90 tablet 2   aspirin-acetaminophen-caffeine (EXCEDRIN MIGRAINE) 250-250-65 MG tablet Take 2 tablets by mouth daily as needed for headache.      Blood Glucose Monitoring Suppl (ONETOUCH VERIO) w/Device KIT Use to check blood sugar 3 times a day and document results, bring to appointments.  Goal is <130 fasting blood sugar and <180 two hours after meals. 1 kit 0   DULoxetine (CYMBALTA) 60 MG capsule Take 60 mg by mouth daily.     famotidine (PEPCID) 20 MG tablet TAKE 1 TABLET BY MOUTH EVERY DAY 90 tablet 2   glucose blood test strip Use to check blood sugar 3 times  daily, fasting in morning with goal <130 and 2 hours after meals with goal <180.  Bring blood sugar log to visits. 100 each 12   goserelin (ZOLADEX) 3.6 MG injection Inject 3.6 mg into the skin every 28 (twenty-eight) days.     Insulin Glargine (BASAGLAR KWIKPEN) 100 UNIT/ML Inject 10 Units into the skin daily. 15 mL 4   Insulin Pen Needle (NOVOFINE) 30G X 8 MM MISC Inject 10 each into the skin as needed. 100 each 4   lamoTRIgine (LAMICTAL) 25 MG tablet Take 25 mg by mouth 2 (two) times daily.     linagliptin (  TRADJENTA) 5 MG TABS tablet Take 1 tablet (5 mg total) by mouth daily. 45 tablet 4   metFORMIN (GLUCOPHAGE-XR) 500 MG 24 hr tablet Start by taking one tablet (500 MG) twice a day with meals and then increase in one week to two tablets (1000 MG) twice a day with meals. 360 tablet 4   naloxone (NARCAN) nasal spray 4 mg/0.1 mL Place 1 spray into the nose as needed for up to 365 doses (for opioid-induced respiratory depresssion). In case of emergency (overdose), spray once into each nostril. If no response within 3 minutes, repeat application and call 092. 1 each 0   [START ON 12/01/2021] oxyCODONE (OXY IR/ROXICODONE) 5 MG immediate release tablet Take 1 tablet (5 mg total) by mouth 2 (two) times daily as needed for severe pain. Must last 30 days 60 tablet 0   [START ON 12/31/2021] oxyCODONE (OXY IR/ROXICODONE) 5 MG immediate release tablet Take 1 tablet (5 mg total) by mouth 2 (two) times daily as needed for severe pain. Must last 30 days 60 tablet 0   [START ON 01/30/2022] oxyCODONE (OXY IR/ROXICODONE) 5 MG immediate release tablet Take 1 tablet (5 mg total) by mouth 2 (two) times daily as needed for severe pain. Must last 30 days 60 tablet 0   pregabalin (LYRICA) 150 MG capsule Take 1 capsule (150 mg total) by mouth 2 (two) times daily. 60 capsule 2   rosuvastatin (CRESTOR) 40 MG tablet Take 1 tablet (40 mg total) by mouth daily. Stop taking Atorvastatin. 90 tablet 4   SUMAtriptan (IMITREX) 100 MG  tablet Take 1 tablet (100 mg total) by mouth every 2 (two) hours as needed for migraine. May repeat in 2 hours if headache persists or recurs. 10 tablet 0   tamoxifen (NOLVADEX) 10 MG tablet TAKE 1 TABLET BY MOUTH EVERY DAY 90 tablet 0   Vitamin D, Ergocalciferol, (DRISDOL) 1.25 MG (50000 UNIT) CAPS capsule TAKE 1 CAPSULE BY MOUTH ONE TIME PER WEEK 12 capsule 1   potassium chloride (KLOR-CON M) 10 MEQ tablet Take 1 tablet (10 mEq total) by mouth daily for 5 days. (Patient not taking: Reported on 11/29/2021) 5 tablet 0   potassium chloride (KLOR-CON) 8 MEQ tablet Take 8 mEq by mouth daily. (Patient not taking: Reported on 11/29/2021)     No current facility-administered medications for this visit.   Facility-Administered Medications Ordered in Other Visits  Medication Dose Route Frequency Provider Last Rate Last Admin   heparin lock flush 100 unit/mL  500 Units Intravenous Once Corcoran, Melissa C, MD       sodium chloride flush (NS) 0.9 % injection 10 mL  10 mL Intravenous Once Lequita Asal, MD          .  PHYSICAL EXAMINATION: ECOG PERFORMANCE STATUS: 0 - Asymptomatic  Vitals:   11/29/21 1300  BP: 104/75  Pulse: 94  Resp: 16  Temp: 98.3 F (36.8 C)   Filed Weights   11/29/21 1300  Weight: 185 lb (83.9 kg)    Physical Exam HENT:     Head: Normocephalic and atraumatic.     Mouth/Throat:     Pharynx: No oropharyngeal exudate.  Eyes:     Pupils: Pupils are equal, round, and reactive to light.  Cardiovascular:     Rate and Rhythm: Normal rate and regular rhythm.  Pulmonary:     Effort: No respiratory distress.     Breath sounds: No wheezing.  Abdominal:     General: Bowel sounds are normal. There is  no distension.     Palpations: Abdomen is soft. There is no mass.     Tenderness: There is no abdominal tenderness. There is no guarding or rebound.  Musculoskeletal:        General: No tenderness. Normal range of motion.     Cervical back: Normal range of motion and  neck supple.  Skin:    General: Skin is warm.     Comments: Right and left BREAST exam [in the presence of nurse]- no unusual skin changes or dominant masses felt. Surgical scars noted.    Neurological:     Mental Status: She is alert and oriented to person, place, and time.  Psychiatric:        Mood and Affect: Affect normal.      LABORATORY DATA:  I have reviewed the data as listed Lab Results  Component Value Date   WBC 7.8 11/29/2021   HGB 11.8 (L) 11/29/2021   HCT 36.3 11/29/2021   MCV 97.8 11/29/2021   PLT 174 11/29/2021   Recent Labs    05/25/21 1320 08/25/21 1417 08/27/21 1302 09/01/21 1349 11/29/21 1252  NA 134*   < > 140 143 138  K 3.8   < > 3.1* 3.9 3.7  CL 101   < > 106 104 105  CO2 24   < > _0 GLUCOSE 293*   < > 152* 104* 123*  BUN 10   < > _1 CREATININE 0.62   < > 0.82 0.78 1.00  CALCIUM 8.8*   < > 8.5* 9.3 9.0  GFRNONAA >60  --  >60  --  >60  PROT 6.4*  --  6.3*  --  6.6  ALBUMIN 3.6  --  3.5  --  3.6  AST 18  --  21  --  20  ALT 15  --  14  --  12  ALKPHOS 63  --  52  --  49  BILITOT 0.4  --  0.5  --  0.4   < > = values in this interval not displayed.    RADIOGRAPHIC STUDIES: I have personally reviewed the radiological images as listed and agreed with the findings in the report. No results found.  ASSESSMENT & PLAN:   Malignant neoplasm of lower-outer quadrant of left breast of female, estrogen receptor positive ([place]) # Breast cancer Stage III ER/PR pos her 2 NEG. currently on Tamoxifen+ Zoldadex.  No clinical evidence of recurrence; STABLE. adjuvant Zometa q6M [June 2022 x3 years]; Zoladex today q3M.  Mammogram August 2023 [Dr.Piscoya]- WNL.   # Currently on Tamoxifen 10 mg [lower dose; previous intolerance to 20 mg/AI- ]. STABLE.   # Mild Anemia- Hb 11.8;recommend PO gentle iron. Hx of colonoscopy- 2022 ? awaiting evaluation with GI- [nov 15th] - Dr.Vanga.   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    # Lymphedema-left chest walls/p physical therapy- STABLE-  # DM-2 on insulin- STABLE [A1c-6.8]Blood glucose 293 [PP]-;   Defer to PCP  # Port malfunction: flush; s/p TPA-STABLE.   Zometa q77m zola 3 m  # DISPOSITION: # add iron studies;ferritin to labs today # Zoladex;  # Follow up in 3 month;  MD;cbc/cmp/iron studies; ferritin- ca-27-29; Zoladex; Zometa- port flush. Dr.B  All questions  were answered. The patient knows to call the clinic with any problems, questions or concerns.    Cammie Sickle, MD 11/29/2021 1:57 PM

## 2021-11-29 NOTE — Assessment & Plan Note (Addendum)
#   Breast cancer Stage III ER/PR pos her 2 NEG. currently on Tamoxifen+ Zoldadex.  No clinical evidence of recurrence; STABLE. adjuvant Zometa q6M [June 2022 x3 years]; Zoladex today q3M.  Mammogram August 2023 [Dr.Piscoya]- WNL.   # Currently on Tamoxifen 10 mg [lower dose; previous intolerance to 20 mg/AI- ]. STABLE.   # Mild Anemia- Hb 11.8;recommend PO gentle iron. Hx of colonoscopy- 2022 ? awaiting evaluation with GI- [nov 15th] - Dr.Vanga.   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    # Lymphedema-left chest walls/p physical therapy- STABLE-  # DM-2 on insulin- STABLE [A1c-6.8]Blood glucose 293 [PP]-;   Defer to PCP  # Port malfunction: flush; s/p TPA-STABLE.   Zometa q23m zola 3 m  # DISPOSITION: # add iron studies;ferritin to labs today # Zoladex;  # Follow up in 3 month;  MD;cbc/cmp/iron studies; ferritin- ca-27-29; Zoladex; Zometa- port flush. Dr.B

## 2021-11-30 ENCOUNTER — Ambulatory Visit: Payer: No Typology Code available for payment source | Admitting: Nurse Practitioner

## 2021-12-01 ENCOUNTER — Encounter: Payer: No Typology Code available for payment source | Admitting: Physician Assistant

## 2021-12-01 LAB — CANCER ANTIGEN 27.29: CA 27.29: <9 U/mL (ref 0.0–38.6)

## 2021-12-01 NOTE — Progress Notes (Signed)
No show

## 2021-12-03 ENCOUNTER — Ambulatory Visit (INDEPENDENT_AMBULATORY_CARE_PROVIDER_SITE_OTHER): Payer: No Typology Code available for payment source | Admitting: Nurse Practitioner

## 2021-12-03 ENCOUNTER — Encounter: Payer: Self-pay | Admitting: Nurse Practitioner

## 2021-12-03 VITALS — BP 92/60 | HR 87 | Temp 98.6°F | Ht 64.0 in | Wt 185.2 lb

## 2021-12-03 DIAGNOSIS — C50512 Malignant neoplasm of lower-outer quadrant of left female breast: Secondary | ICD-10-CM | POA: Diagnosis not present

## 2021-12-03 DIAGNOSIS — E785 Hyperlipidemia, unspecified: Secondary | ICD-10-CM

## 2021-12-03 DIAGNOSIS — G894 Chronic pain syndrome: Secondary | ICD-10-CM

## 2021-12-03 DIAGNOSIS — E1165 Type 2 diabetes mellitus with hyperglycemia: Secondary | ICD-10-CM

## 2021-12-03 DIAGNOSIS — F17219 Nicotine dependence, cigarettes, with unspecified nicotine-induced disorders: Secondary | ICD-10-CM

## 2021-12-03 DIAGNOSIS — E6609 Other obesity due to excess calories: Secondary | ICD-10-CM

## 2021-12-03 DIAGNOSIS — Z6831 Body mass index (BMI) 31.0-31.9, adult: Secondary | ICD-10-CM

## 2021-12-03 DIAGNOSIS — E1169 Type 2 diabetes mellitus with other specified complication: Secondary | ICD-10-CM

## 2021-12-03 DIAGNOSIS — T451X5A Adverse effect of antineoplastic and immunosuppressive drugs, initial encounter: Secondary | ICD-10-CM

## 2021-12-03 DIAGNOSIS — E669 Obesity, unspecified: Secondary | ICD-10-CM | POA: Insufficient documentation

## 2021-12-03 DIAGNOSIS — E559 Vitamin D deficiency, unspecified: Secondary | ICD-10-CM

## 2021-12-03 DIAGNOSIS — Z170 Estrogen receptor positive status [ER+]: Secondary | ICD-10-CM

## 2021-12-03 DIAGNOSIS — F19982 Other psychoactive substance use, unspecified with psychoactive substance-induced sleep disorder: Secondary | ICD-10-CM

## 2021-12-03 DIAGNOSIS — F322 Major depressive disorder, single episode, severe without psychotic features: Secondary | ICD-10-CM

## 2021-12-03 DIAGNOSIS — G620 Drug-induced polyneuropathy: Secondary | ICD-10-CM

## 2021-12-03 DIAGNOSIS — I951 Orthostatic hypotension: Secondary | ICD-10-CM

## 2021-12-03 DIAGNOSIS — F1120 Opioid dependence, uncomplicated: Secondary | ICD-10-CM

## 2021-12-03 DIAGNOSIS — Z794 Long term (current) use of insulin: Secondary | ICD-10-CM

## 2021-12-03 LAB — BAYER DCA HB A1C WAIVED: HB A1C (BAYER DCA - WAIVED): 7.7 % — ABNORMAL HIGH (ref 4.8–5.6)

## 2021-12-03 MED ORDER — LAMOTRIGINE 25 MG PO TABS: 25.0000 mg | ORAL_TABLET | Freq: Two times a day (BID) | ORAL | 4 refills | Status: DC

## 2021-12-03 MED ORDER — LINAGLIPTIN 5 MG PO TABS: 5.0000 mg | ORAL_TABLET | Freq: Every day | ORAL | 4 refills | Status: DC

## 2021-12-03 NOTE — Assessment & Plan Note (Signed)
Chronic, continue supplement and recheck level today.

## 2021-12-03 NOTE — Assessment & Plan Note (Signed)
Continue collaboration with oncology, recent note reviewed.

## 2021-12-03 NOTE — Assessment & Plan Note (Signed)
Followed by pain management, continue this collaboration, recent notes reviewed.

## 2021-12-03 NOTE — Assessment & Plan Note (Signed)
BMI 31.79.  Recommended eating smaller high protein, low fat meals more frequently and exercising 30 mins a day 5 times a week with a goal of 10-15lb weight loss in the next 3 months. Patient voiced their understanding and motivation to adhere to these recommendations.

## 2021-12-03 NOTE — Addendum Note (Signed)
Addended by: Marnee Guarneri T on: 12/03/2021 07:24 PM   Modules accepted: Orders

## 2021-12-03 NOTE — Assessment & Plan Note (Signed)
Chronic, ongoing.  Continue current medication regimen and collaboration with psychiatry.  Currently denies SI/HI.

## 2021-12-03 NOTE — Assessment & Plan Note (Signed)
I have recommended complete cessation of tobacco use. I have discussed various options available for assistance with tobacco cessation including over the counter methods (Nicotine gum, patch and lozenges). We also discussed prescription options (Chantix, Nicotine Inhaler / Nasal Spray). The patient is not interested in pursuing any prescription tobacco cessation options at this time.  Recommend we start lung CT screening, she will plan on this next visit as has had a lot of testing recently.

## 2021-12-03 NOTE — Assessment & Plan Note (Signed)
Chronic, ongoing.  Continue current pain management collaboration and medication regimen as prescribed by them.

## 2021-12-03 NOTE — Assessment & Plan Note (Signed)
Followed by Dr. Nicolasa Ducking with psychiatry, continue this collaboration.

## 2021-12-03 NOTE — Progress Notes (Addendum)
BP 92/60   Pulse 87   Temp 98.6 F (37 C) (Oral)   Ht '5\' 4"'$  (1.626 m)   Wt 185 lb 3.2 oz (84 kg)   LMP  (LMP Unknown) Comment: LAST PERIOD IN MAY 2019 WHEN SHE STARTED CHEMO  SpO2 97%   BMI 31.79 kg/m    Subjective:    Patient ID: Carolyn Roy, female    DOB: [birth year], 56 y.o.   MRN: [id number]  HPI: Carolyn Roy is a 56 y.o. female  Chief Complaint  Patient presents with   Diabetes   Hyperlipidemia   Hypertension   Breast Cancer   Pain   DIABETES Last A1c 10.6% in July. Continues on Metformin 1000 MG BID, Tradjenta 5 MG (started last visit and tolerating), and Basaglar 34 units.  Can not take Jardiance at this time as too expensive.  Trulicity stopped due to GI effects.  No steroid use recently.  She is a smoker, 1/2 PPD -- plans on ordering patches from [facility].  Quit for 6 years, but then started back.  She started smoking at age 8, smoked 34-35 years total. Hypoglycemic episodes:no Polydipsia/polyuria: no Visual disturbance: no Chest pain: no Paresthesias: no Glucose Monitoring: yes  Accucheck frequency: occasional  Fasting glucose: 72 to 110  Post prandial: 130-180  Evening:   Before meals:  Taking Insulin?: yes  Long acting insulin: 34 units  Short acting insulin: Blood Pressure Monitoring: not checking Retinal Examination: Up To Date Foot Exam: Up to Date Pneumovax: Up To Date Influenza: Up To Date Aspirin: no   HYPOTENSION Recent episodes of hypotension noted at oncology visits.  She saw cardiology for this on 12/01/21 -- was told cardiac work-up was good and is wearing compression shorts daily.   Recurrent headaches: at times at baseline migraines Visual changes: none Palpitations: occasional, baseline Dyspnea: no Chest pain: no Lower extremity edema: no Dizzy/lightheaded: no, but gets a little off balance at times   HYPERLIPIDEMIA Taking Rosuvastatin 40 MG daily.    Had sleep study at [facility], she reports Dr. Mickeal Skinner  told her they want to do a CPAP titration, but she has not heard from them -- reports cardiology tried to get in touch with them.  Satisfied with current treatment? yes Duration of hyperlipidemia: chronic Cholesterol medication side effects: no Cholesterol supplements: none Aspirin: no Recent stressors: no Recurrent headaches: no Visual changes: no Palpitations: no Dyspnea: no Chest pain: no Lower extremity edema: no Dizzy/lightheaded: no  The ASCVD Risk score (Arnett DK, et al., 2019) failed to calculate for the following reasons:   The valid total cholesterol range is 130 to 320 mg/dL   CHRONIC PAIN  Followed by pain clinic for chronic pain -- last visit 11/24/21 continues Oxycodone and Pregabalin. Pain control status: stable Duration: chronic Location: knees and back Quality: dull, aching, and throbbing Current Pain Level: 1/10 Previous Pain Level: 10/10 Breakthrough pain: no Benefit from narcotic medications: yes What Activities task can be accomplished with current medication? Able to work and perform ADLs Interested in weaning off narcotics:no   Stool softners/OTC fiber: yes  Previous pain specialty evaluation: yes Non-narcotic analgesic meds: no Narcotic contract: yes   DEPRESSION Taking Duloxetine 60 MG daily, Lamictal 25 MG BID,  + Amitriptyline.  Followed by Dr. Nicolasa Ducking.  Is currently followed by oncology for breast CA, left side.  Last saw oncology on 11/29/21 -- she is completed treatments, continues Tamoxifen and goes every 6 months for injections and infusions. Mood status: stable  States she occasionally takes duloxetine. Satisfied with current treatment?: yes Symptom severity: stable to mild  Duration of current treatment : chronic Side effects: no Medication compliance: good compliance Psychotherapy/counseling: none Depressed mood: no Anxious mood: no Anhedonia: no Significant weight loss or gain: no Insomnia: yes hard to stay asleep Fatigue:  no Feelings of worthlessness or guilt: no Impaired concentration/indecisiveness: no Suicidal ideations: no Hopelessness: no Crying spells: Yes, has become very frequent    12/03/2021    2:32 PM 11/24/2021    2:55 PM 08/25/2021    2:14 PM 07/28/2021    2:50 PM 05/31/2021    2:05 PM  Depression screen PHQ 2/9  Decreased Interest 0 0 0 1 0  Down, Depressed, Hopeless 1 0 0 0 0  PHQ - 2 Score 1 0 0 1 0  Altered sleeping '1  1 1   '$ Tired, decreased energy '1  1 1   '$ Change in appetite '1  1 1   '$ Feeling bad or failure about yourself  0  0 0   Trouble concentrating 0  0 0   Moving slowly or fidgety/restless 2  1 0   Suicidal thoughts 0  0 0   PHQ-9 Score '6  4 4   '$ Difficult doing work/chores Somewhat difficult  Somewhat difficult Not difficult at all       12/03/2021    2:33 PM 08/25/2021    2:14 PM 07/28/2021    2:51 PM 05/14/2021   11:43 AM  GAD 7 : Generalized Anxiety Score  Nervous, Anxious, on Edge '1 1 1 1  '$ Control/stop worrying 0 0 0 0  Worry too much - different things 0 1 1 0  Trouble relaxing 0 0 0 1  Restless 0 0 0 1  Easily annoyed or irritable 0 0 1 1  Afraid - awful might happen 0 0 1 0  Total GAD 7 Score '1 2 4 4  '$ Anxiety Difficulty Somewhat difficult Not difficult at all Not difficult at all Somewhat difficult    Relevant past medical, surgical, family and social history reviewed and updated as indicated. Interim medical history since our last visit reviewed. Allergies and medications reviewed and updated.  Review of Systems  Constitutional:  Negative for activity change, appetite change, fatigue and fever.  Respiratory:  Negative for cough, chest tightness and shortness of breath.   Cardiovascular:  Negative for chest pain, palpitations and leg swelling.  Gastrointestinal: Negative.   Endocrine: Negative for cold intolerance, heat intolerance, polydipsia, polyphagia and polyuria.  Genitourinary:  Negative for difficulty urinating.  Musculoskeletal:  Negative for  myalgias.  Neurological: Negative.  Negative for dizziness and light-headedness.  Psychiatric/Behavioral:  Negative for decreased concentration, self-injury, sleep disturbance and suicidal ideas. The patient is not nervous/anxious.    Per HPI unless specifically indicated above     Objective:    BP 92/60   Pulse 87   Temp 98.6 F (37 C) (Oral)   Ht '5\' 4"'$  (1.626 m)   Wt 185 lb 3.2 oz (84 kg)   LMP  (LMP Unknown) Comment: LAST PERIOD IN MAY 2019 WHEN SHE STARTED CHEMO  SpO2 97%   BMI 31.79 kg/m   Wt Readings from Last 3 Encounters:  12/03/21 185 lb 3.2 oz (84 kg)  11/29/21 185 lb (83.9 kg)  11/24/21 186 lb 11.2 oz (84.7 kg)    Physical Exam Vitals and nursing note reviewed.  Constitutional:      General: She is awake. She is not in acute distress.  Appearance: She is well-developed and well-groomed. She is obese. She is not ill-appearing or toxic-appearing.  HENT:     Head: Normocephalic.     Right Ear: Hearing normal.     Left Ear: Hearing normal.  Eyes:     General: Lids are normal.        Right eye: No discharge.        Left eye: No discharge.     Conjunctiva/sclera: Conjunctivae normal.     Pupils: Pupils are equal, round, and reactive to light.  Neck:     Thyroid: No thyromegaly.     Vascular: No carotid bruit.  Cardiovascular:     Rate and Rhythm: Normal rate and regular rhythm.     Pulses:          Dorsalis pedis pulses are 2+ on the right side and 2+ on the left side.     Heart sounds: Normal heart sounds. No murmur heard.    No gallop.  Pulmonary:     Effort: Pulmonary effort is normal. No accessory muscle usage or respiratory distress.     Breath sounds: Normal breath sounds. No wheezing or rhonchi.  Abdominal:     General: Bowel sounds are normal.     Palpations: Abdomen is soft.  Musculoskeletal:     Cervical back: Normal range of motion and neck supple.     Right lower leg: No edema.     Left lower leg: No edema.  Lymphadenopathy:     Cervical:  No cervical adenopathy.  Skin:    General: Skin is warm and dry.  Neurological:     Mental Status: She is alert and oriented to person, place, and time.     Cranial Nerves: Cranial nerves 2-12 are intact.     Motor: Motor function is intact.     Coordination: Coordination is intact.     Gait: Gait is intact.     Deep Tendon Reflexes: Reflexes are normal and symmetric.     Reflex Scores:      Brachioradialis reflexes are 2+ on the right side and 2+ on the left side.      Patellar reflexes are 2+ on the right side and 2+ on the left side. Psychiatric:        Attention and Perception: Attention normal.        Mood and Affect: Mood normal.        Speech: Speech normal.        Behavior: Behavior normal. Behavior is cooperative.        Thought Content: Thought content normal.    Diabetic Foot Exam - Simple   Simple Foot Form Visual Inspection No deformities, no ulcerations, no other skin breakdown bilaterally: Yes Sensation Testing Intact to touch and monofilament testing bilaterally: Yes Pulse Check Posterior Tibialis and Dorsalis pulse intact bilaterally: Yes Comments     Results for orders placed or performed in visit on 12/03/21  Bayer DCA Hb A1c Waived  Result Value Ref Range   HB A1C (BAYER DCA - WAIVED) 7.7 (H) 4.8 - 5.6 %      Assessment & Plan:   Problem List Items Addressed This Visit       Cardiovascular and Mediastinum   Hypotension    Ongoing for several years with baseline BP between 90-110/60-70 on review going back to 2020.  Recommend to continue to take plenty of water intake daily and add a some salt to diet + wear compression on during day and off  at night.  She has seen cardiology with overall stable work-up.        Endocrine   Hyperlipidemia associated with type 2 diabetes mellitus (HCC)    Chronic, ongoing.  Continue Rosuvastatin 40 MG daily.  Lipid panel today and adjust dose as needed.      Relevant Medications   linagliptin (TRADJENTA) 5 MG  TABS tablet   Other Relevant Orders   Bayer DCA Hb A1c Waived (Completed)   Comprehensive metabolic panel   Lipid Panel w/o Chol/HDL Ratio   Type 2 diabetes mellitus with hyperglycemia, with long-term current use of insulin (HCC) - Primary    Chronic, ongoing.  A1c July 10.6%, prior was 10.1% today is 7.7%, trending down with insulin changes and Tradjenta. Eye exam and foot exam up to date.  Can not take Jardiance due to cost. - Increase Basaglar to 40 units, written down instructions and given to patient.  Previously followed by endo, but prefers not to return at this time.  - Will continue Metformin XR 1000 MG BID and continue Tradjenta.  Monitor for ADR.  -  Recommend monitor BS at home TID and heavily focus on diet changes.    - Return in 3 months for A1c check.  Will have return to endo if ongoing poor control.      Relevant Medications   linagliptin (TRADJENTA) 5 MG TABS tablet   Other Relevant Orders   Bayer DCA Hb A1c Waived (Completed)   TSH     Nervous and Auditory   Chemotherapy-induced neuropathy (HCC) (Chronic)    Chronic, ongoing.  Continue current pain management collaboration and medication regimen as prescribed by them.      Relevant Medications   lamoTRIgine (LAMICTAL) 25 MG tablet   Other Relevant Orders   TSH   Nicotine dependence, cigarettes, w unsp disorders    I have recommended complete cessation of tobacco use. I have discussed various options available for assistance with tobacco cessation including over the counter methods (Nicotine gum, patch and lozenges). We also discussed prescription options (Chantix, Nicotine Inhaler / Nasal Spray). The patient is not interested in pursuing any prescription tobacco cessation options at this time.  Recommend we start lung CT screening, she will plan on this next visit as has had a lot of testing recently.          Other   Chronic pain syndrome (Chronic)    Followed by pain management, continue this collaboration,  recent notes reviewed.      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 Nicolasa Ducking with psychiatry, continue this collaboration.      Malignant neoplasm of lower-outer quadrant of left breast of female, estrogen receptor positive ([place])    Continue collaboration with oncology, recent note reviewed.      Relevant Orders   TSH   Obesity    BMI 31.79.  Recommended eating smaller high protein, low fat meals more frequently and exercising 30 mins a day 5 times a week with a goal of 10-15lb weight loss in the next 3 months. Patient voiced their understanding and motivation to adhere to these recommendations.       Relevant Medications   linagliptin (TRADJENTA) 5 MG TABS tablet   Severe depression (HCC)    Chronic, ongoing.  Continue  current medication regimen and collaboration with psychiatry.  Currently denies SI/HI.      Relevant Orders   TSH   Vitamin D deficiency    Chronic, continue supplement and recheck level today.      Relevant Orders   VITAMIN D 25 Hydroxy (Vit-D Deficiency, Fractures)     Follow up plan: Return in about 3 months (around 03/05/2022) for T2DM, HTN/HLD, GERD, MOOD, CANCER.

## 2021-12-03 NOTE — Assessment & Plan Note (Signed)
Ongoing for several years with baseline BP between 90-110/60-70 on review going back to 2020.  Recommend to continue to take plenty of water intake daily and add a some salt to diet + wear compression on during day and off at night.  She has seen cardiology with overall stable work-up.

## 2021-12-03 NOTE — Assessment & Plan Note (Signed)
Chronic, ongoing.  Continue Rosuvastatin 40 MG daily.  Lipid panel today and adjust dose as needed.

## 2021-12-03 NOTE — Assessment & Plan Note (Signed)
Chronic, ongoing.  A1c July 10.6%, prior was 10.1% today is 7.7%, trending down with insulin changes and Tradjenta. Eye exam and foot exam up to date.  Can not take Jardiance due to cost. - Increase Basaglar to 40 units, written down instructions and given to patient.  Previously followed by endo, but prefers not to return at this time.  - Will continue Metformin XR 1000 MG BID and continue Tradjenta.  Monitor for ADR.  -  Recommend monitor BS at home TID and heavily focus on diet changes.    - Return in 3 months for A1c check.  Will have return to endo if ongoing poor control.

## 2021-12-04 LAB — COMPREHENSIVE METABOLIC PANEL
ALT: 10 IU/L (ref 0–32)
AST: 13 IU/L (ref 0–40)
Albumin/Globulin Ratio: 1.8 (ref 1.2–2.2)
Albumin: 4 g/dL (ref 3.8–4.9)
Alkaline Phosphatase: 59 IU/L (ref 44–121)
BUN/Creatinine Ratio: 10 (ref 9–23)
BUN: 10 mg/dL (ref 6–24)
Bilirubin Total: 0.3 mg/dL (ref 0.0–1.2)
CO2: 24 mmol/L (ref 20–29)
Calcium: 9.3 mg/dL (ref 8.7–10.2)
Chloride: 106 mmol/L (ref 96–106)
Creatinine, Ser: 1.02 mg/dL — ABNORMAL HIGH (ref 0.57–1.00)
Globulin, Total: 2.2 g/dL (ref 1.5–4.5)
Glucose: 111 mg/dL — ABNORMAL HIGH (ref 70–99)
Potassium: 3.9 mmol/L (ref 3.5–5.2)
Sodium: 141 mmol/L (ref 134–144)
Total Protein: 6.2 g/dL (ref 6.0–8.5)
eGFR: 65 mL/min/{1.73_m2} (ref >59)

## 2021-12-04 LAB — LIPID PANEL W/O CHOL/HDL RATIO
Cholesterol, Total: 103 mg/dL (ref 100–199)
HDL: 56 mg/dL (ref >39)
LDL Chol Calc (NIH): 33 mg/dL (ref 0–99)
Triglycerides: 63 mg/dL (ref 0–149)
VLDL Cholesterol Cal: 14 mg/dL (ref 5–40)

## 2021-12-04 LAB — VITAMIN D 25 HYDROXY (VIT D DEFICIENCY, FRACTURES): Vit D, 25-Hydroxy: 76.3 ng/mL (ref 30.0–100.0)

## 2021-12-04 LAB — TSH: TSH: 2.53 u[IU]/mL (ref 0.450–4.500)

## 2021-12-05 NOTE — Progress Notes (Signed)
Contacted via MyChart   Good evening Carolyn Roy, your labs have returned and overall remain stable and at goal. Cholesterol levels at goal with change to Rosuvastatin!!  Great news!! Works much better for you.  Continue all current medications.  No changes needed.  Any questions? Keep being amazing!!  Thank you for allowing me to participate in your care.  I appreciate you. Kindest regards, Maddex Garlitz

## 2021-12-06 ENCOUNTER — Encounter: Payer: Self-pay | Admitting: Radiation Oncology

## 2021-12-06 ENCOUNTER — Ambulatory Visit
Admission: RE | Admit: 2021-12-06 | Discharge: 2021-12-06 | Disposition: A | Discharge status: Home / Self Care | Payer: No Typology Code available for payment source | Source: Ambulatory Visit | Attending: Radiation Oncology | Admitting: Radiation Oncology

## 2021-12-06 VITALS — BP 109/70 | HR 92 | Temp 98.0°F | Resp 20 | Ht 64.0 in | Wt 187.0 lb

## 2021-12-06 DIAGNOSIS — C50512 Malignant neoplasm of lower-outer quadrant of left female breast: Secondary | ICD-10-CM | POA: Diagnosis not present

## 2021-12-06 DIAGNOSIS — Z79810 Long term (current) use of selective estrogen receptor modulators (SERMs): Secondary | ICD-10-CM | POA: Diagnosis not present

## 2021-12-06 DIAGNOSIS — Z923 Personal history of irradiation: Secondary | ICD-10-CM | POA: Insufficient documentation

## 2021-12-06 DIAGNOSIS — Z170 Estrogen receptor positive status [ER+]: Secondary | ICD-10-CM | POA: Diagnosis not present

## 2021-12-06 NOTE — Progress Notes (Signed)
Radiation Oncology Follow up Note  Name: Carolyn Roy   Date:   12/06/2021 MRN:  [id number] DOB: 12-09-1965    This 56 y.o. 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  Seen today in routine follow-up she is doing well.  She specifically denies breast tenderness cough or bone pain..  She had mammograms back in August which were BI-RADS 1 negative which I have reviewed.  She is currently on tamoxifen tolerating that well without side effect.  Patient is also currently on Zoladex  COMPLICATIONS OF TREATMENT: none  FOLLOW UP COMPLIANCE: keeps appointments   PHYSICAL EXAM:  BP 109/70 (BP Location: Left Arm, Patient Position: Sitting, Cuff Size: Normal)   Pulse 92   Temp 98 F (36.7 C) (Tympanic)   Resp 20   Ht '5\' 4"'$  (1.626 m)   Wt 187 lb (84.8 kg)   LMP  (LMP Unknown) Comment: LAST PERIOD IN MAY 2019 WHEN SHE STARTED CHEMO  BMI 32.10 kg/m  Lungs are clear to A&P cardiac examination essentially unremarkable with regular rate and rhythm. No dominant mass or nodularity is noted in either breast in 2 positions examined. Incision is well-healed. No axillary or supraclavicular adenopathy is appreciated. Cosmetic result is excellent.  Well-developed well-nourished patient in NAD. HEENT reveals PERLA, EOMI, discs not visualized.  Oral cavity is clear. No oral mucosal lesions are identified. Neck is clear without evidence of cervical or supraclavicular adenopathy. Lungs are clear to A&P. Cardiac examination is essentially unremarkable with regular rate and rhythm without murmur rub or thrill. Abdomen is benign with no organomegaly or masses noted. Motor  sensory and DTR levels are equal and symmetric in the upper and lower extremities. Cranial nerves II through XII are grossly intact. Proprioception is intact. No peripheral adenopathy or edema is identified. No motor or sensory levels are noted. Crude visual fields are within normal range.  RADIOLOGY RESULTS: Mammo was reviewed compatible with above-stated findings  PLAN: Present time patient is doing well now out over 3 years with no evidence of disease.  And pleased with her overall progress.  I am turning follow-up care over to medical oncology.  I be happy to reevaluate the patient anytime should that be indicated.  I would like to take this opportunity to thank you for allowing me to participate in the care of your patient.Noreene Filbert, MD

## 2021-12-20 ENCOUNTER — Encounter: Payer: Self-pay | Admitting: Nurse Practitioner

## 2021-12-20 MED ORDER — BASAGLAR KWIKPEN 100 UNIT/ML ~~LOC~~ SOPN: 10.0000 [IU] | PEN_INJECTOR | Freq: Every day | SUBCUTANEOUS | 2 refills | Status: DC

## 2021-12-22 ENCOUNTER — Ambulatory Visit (INDEPENDENT_AMBULATORY_CARE_PROVIDER_SITE_OTHER): Payer: No Typology Code available for payment source | Admitting: Gastroenterology

## 2021-12-22 ENCOUNTER — Encounter: Payer: Self-pay | Admitting: Gastroenterology

## 2021-12-22 ENCOUNTER — Other Ambulatory Visit: Payer: Self-pay

## 2021-12-22 ENCOUNTER — Encounter: Payer: Self-pay | Admitting: Internal Medicine

## 2021-12-22 VITALS — BP 122/79 | HR 77 | Temp 98.1°F | Ht 64.0 in | Wt 191.4 lb

## 2021-12-22 DIAGNOSIS — K5909 Other constipation: Secondary | ICD-10-CM | POA: Diagnosis not present

## 2021-12-22 DIAGNOSIS — Z86010 Personal history of colonic polyps: Secondary | ICD-10-CM

## 2021-12-22 MED ORDER — NA SULFATE-K SULFATE-MG SULF 17.5-3.13-1.6 GM/177ML PO SOLN: 354.0000 mL | Freq: Once | ORAL | 0 refills | Status: AC

## 2021-12-22 NOTE — Progress Notes (Signed)
Carolyn Darby, MD [address]  [place], [place] [address]  Main: [phone]  Fax: [phone]    Gastroenterology Consultation  Referring Provider:     [name], NP Primary Care Physician:  [name], NP Primary Gastroenterologist:  Dr. Cephas Roy Reason for Consultation: Chronic constipation        HPI:   Carolyn Roy is a 56 y.o. female referred by Dr. [name], NP  for consultation & management of chronic constipation.  Patient reports that she has been experiencing irregular bowel habits since she was a child.  She reports having bowel movement about once a week or even more.  She has tried over-the-counter stool softeners with not much help.  She did not try MiraLAX.  She denies any abdominal pain or discomfort.  Does report abdominal bloating.  She had a colonoscopy in 2021 which was fair prep.  She smokes cigarettes.  Does not drink alcohol.  TSH normal.  Has mild normocytic anemia, no evidence of iron deficiency or B12 deficiency  NSAIDs: None  Antiplts/Anticoagulants/Anti thrombotics: None  GI Procedures:  Colonoscopy 12/27/2019 by Dr. Bonna Gains - Preparation of the colon was fair. - A single (solitary) ulcer in the cecum. Biopsied. - One 3 mm polyp in the cecum, removed with a cold biopsy forceps. Resected and retrieved. - Diverticulosis in the sigmoid colon. - The examination was otherwise normal. - The rectum, sigmoid colon, descending colon, transverse colon, ascending colon and cecum are normal. - Non-bleeding internal hemorrhoids.  DIAGNOSIS:  A. COLON POLYP AND ULCER; CECUM; COLD BIOPSY:  - ULCERATION, GRANULATION TISSUE TYPE CHANGES, AND FIBRINOPURULENT  DEBRIS.  - BENIGN COLONIC MUCOSA WITH SUPERFICIAL REACTIVE CHANGES AND SMALL  LYMPHOID AGGREGATE.  - NEGATIVE FOR DYSPLASIA AND MALIGNANCY.  Past Medical History:  Diagnosis Date   Allergy    Breast cancer ([place])    Cancer ([place]) 06/15/2017   5.1  cm, T3,N1 (clinical): ER/ PR positive, Her 2 neu not overexpressed, High Ki 67. Neuoadjuvant chemotherapy.    Depression    Diabetes mellitus without complication ([place]) 8676   Edema of left upper extremity    Endometriosis    Family history of breast cancer    Headache    migraines   Hyperlipidemia    Lymphedema of left arm    Ovarian mass    Personal history of chemotherapy    Personal history of radiation therapy    Pneumonia    2018    Past Surgical History:  Procedure Laterality Date   AXILLARY LYMPH NODE BIOPSY Left 07/14/2017   Procedure: INSERTION GEL MARK CLIP LEFT AXILLA;  Surgeon: Robert Bellow, MD;  Location: [facility];  Service: General;  Laterality: Left;   BREAST BIOPSY Left    Dr Orlene Och BREAST METASTATIC CARCINOMA   BREAST LUMPECTOMY Left 01/12/2018   COLONOSCOPY WITH PROPOFOL N/A 12/27/2019   Procedure: COLONOSCOPY WITH PROPOFOL;  Surgeon: Virgel Manifold, MD;  Location: [facility];  Service: Endoscopy;  Laterality: N/A;   OOPHORECTOMY     PARTIAL MASTECTOMY WITH NEEDLE LOCALIZATION Left 01/12/2018   Procedure: PARTIAL MASTECTOMY WITH NEEDLE LOCALIZATION;  Surgeon: Robert Bellow, MD;  Location: [facility];  Service: General;  Laterality: Left;   PORTACATH PLACEMENT Right 07/14/2017   Procedure: INSERTION PORT-A-CATH;  Surgeon: Robert Bellow, MD;  Location: [facility];  Service: General;  Laterality: Right;   SENTINEL NODE BIOPSY Left 01/12/2018   Procedure: SENTINEL NODE BIOPSY;  Surgeon: Robert Bellow, MD;  Location: [facility];  Service: General;  Laterality: Left;   TUBAL LIGATION       Current Outpatient Medications:    amitriptyline (ELAVIL) 50 MG tablet, TAKE 1 TABLET BY MOUTH EVERYDAY AT BEDTIME, Disp: 90 tablet, Rfl: 2   aspirin-acetaminophen-caffeine (EXCEDRIN MIGRAINE) 250-250-65 MG tablet, Take 2 tablets by mouth daily as needed for headache. , Disp: , Rfl:    Blood Glucose Monitoring Suppl (ONETOUCH VERIO) w/Device KIT, Use to  check blood sugar 3 times a day and document results, bring to appointments.  Goal is <130 fasting blood sugar and <180 two hours after meals., Disp: 1 kit, Rfl: 0   DULoxetine (CYMBALTA) 60 MG capsule, Take 60 mg by mouth daily., Disp: , Rfl:    famotidine (PEPCID) 20 MG tablet, TAKE 1 TABLET BY MOUTH EVERY DAY, Disp: 90 tablet, Rfl: 2   glucose blood test strip, Use to check blood sugar 3 times daily, fasting in morning with goal <130 and 2 hours after meals with goal <180.  Bring blood sugar log to visits., Disp: 100 each, Rfl: 12   goserelin (ZOLADEX) 3.6 MG injection, Inject 3.6 mg into the skin every 28 (twenty-eight) days., Disp: , Rfl:    Insulin Glargine (BASAGLAR KWIKPEN) 100 UNIT/ML, Inject 10 Units into the skin daily., Disp: 30 mL, Rfl: 2   Insulin Pen Needle (NOVOFINE) 30G X 8 MM MISC, Inject 10 each into the skin as needed., Disp: 100 each, Rfl: 4   lamoTRIgine (LAMICTAL) 25 MG tablet, Take 1 tablet (25 mg total) by mouth 2 (two) times daily., Disp: 60 tablet, Rfl: 4   linagliptin (TRADJENTA) 5 MG TABS tablet, Take 1 tablet (5 mg total) by mouth daily., Disp: 90 tablet, Rfl: 4   metFORMIN (GLUCOPHAGE-XR) 500 MG 24 hr tablet, Start by taking one tablet (500 MG) twice a day with meals and then increase in one week to two tablets (1000 MG) twice a day with meals., Disp: 360 tablet, Rfl: 4   Na Sulfate-K Sulfate-Mg Sulf 17.5-3.13-1.6 GM/177ML SOLN, Take 354 mLs by mouth once for 1 dose., Disp: 354 mL, Rfl: 0   naloxone (NARCAN) nasal spray 4 mg/0.1 mL, Place 1 spray into the nose as needed for up to 365 doses (for opioid-induced respiratory depresssion). In case of emergency (overdose), spray once into each nostril. If no response within 3 minutes, repeat application and call [phone]., Disp: 1 each, Rfl: 0   oxyCODONE (OXY IR/ROXICODONE) 5 MG immediate release tablet, Take 1 tablet (5 mg total) by mouth 2 (two) times daily as needed for severe pain. Must last 30 days, Disp: 60 tablet, Rfl: 0    [START ON 12/31/2021] oxyCODONE (OXY IR/ROXICODONE) 5 MG immediate release tablet, Take 1 tablet (5 mg total) by mouth 2 (two) times daily as needed for severe pain. Must last 30 days, Disp: 60 tablet, Rfl: 0   [START ON 01/30/2022] oxyCODONE (OXY IR/ROXICODONE) 5 MG immediate release tablet, Take 1 tablet (5 mg total) by mouth 2 (two) times daily as needed for severe pain. Must last 30 days, Disp: 60 tablet, Rfl: 0   pregabalin (LYRICA) 150 MG capsule, Take 1 capsule (150 mg total) by mouth 2 (two) times daily., Disp: 60 capsule, Rfl: 2   rosuvastatin (CRESTOR) 40 MG tablet, Take 1 tablet (40 mg total) by mouth daily. Stop taking Atorvastatin., Disp: 90 tablet, Rfl: 4   SUMAtriptan (IMITREX) 100 MG tablet, Take 1 tablet (100 mg total) by mouth every 2 (  two) hours as needed for migraine. May repeat in 2 hours if headache persists or recurs., Disp: 10 tablet, Rfl: 0   tamoxifen (NOLVADEX) 10 MG tablet, TAKE 1 TABLET BY MOUTH EVERY DAY, Disp: 90 tablet, Rfl: 0   Vitamin D, Ergocalciferol, (DRISDOL) 1.25 MG (50000 UNIT) CAPS capsule, TAKE 1 CAPSULE BY MOUTH ONE TIME PER WEEK, Disp: 12 capsule, Rfl: 1 No current facility-administered medications for this visit.  Facility-Administered Medications Ordered in Other Visits:    heparin lock flush 100 unit/mL, 500 Units, Intravenous, Once, Corcoran, Melissa C, MD   sodium chloride flush (NS) 0.9 % injection 10 mL, 10 mL, Intravenous, Once, Corcoran, Drue Second, MD   Family History  Problem Relation Age of Onset   Colon cancer Mother    Cancer Mother    Other Father        family hx on dad's side: breast, colon, stomach cancer-biological dad   Diabetes Brother    Pancreatitis Brother    Prostate cancer Brother 65       currently 87 / maternal half-brother   Breast cancer Maternal Aunt 19       currently 33   Breast cancer Maternal Grandmother 19       deceased 87s   Colon cancer Maternal Grandmother    Breast cancer Other 35       mother's sister;  deceased 61   Breast cancer Other        mother's sister; age at dx unknown     Social History   Tobacco Use   Smoking status: Every Day    Packs/day: 1.00    Years: 11.00    Total pack years: 11.00    Types: Cigarettes   Smokeless tobacco: Former    Types: Snuff  Vaping Use   Vaping Use: Never used  Substance Use Topics   Alcohol use: No    Alcohol/week: 0.0 standard drinks of alcohol   Drug use: No    Allergies as of 12/22/2021 - Review Complete 12/22/2021  Allergen Reaction Noted   Aspirin Nausea And Vomiting 04/14/2015    Review of Systems:    All systems reviewed and negative except where noted in HPI.   Physical Exam:  BP 122/79 (BP Location: Left Arm, Patient Position: Sitting, Cuff Size: Normal)   Pulse 77   Temp 98.1 F (36.7 C) (Oral)   Ht _0  (1.626 m)   Wt 191 lb 6 oz (86.8 kg)   LMP  (LMP Unknown) Comment: LAST PERIOD IN MAY 2019 WHEN SHE STARTED CHEMO  BMI 32.85 kg/m  No LMP recorded (lmp unknown). Patient is postmenopausal.  General:   Alert,  Well-developed, well-nourished, pleasant and cooperative in NAD Head:  Normocephalic and atraumatic. Eyes:  Sclera clear, no icterus.   Conjunctiva pink. Ears:  Normal auditory acuity. Nose:  No deformity, discharge, or lesions. Mouth:  No deformity or lesions,oropharynx pink & moist. Neck:  Supple; no masses or thyromegaly. Lungs:  Respirations even and unlabored.  Clear throughout to auscultation.   No wheezes, crackles, or rhonchi. No acute distress. Heart:  Regular rate and rhythm; no murmurs, clicks, rubs, or gallops. Abdomen:  Normal bowel sounds. Soft, non-tender and non-distended without masses, hepatosplenomegaly or hernias noted.  No guarding or rebound tenderness.   Rectal: Not performed Msk:  Symmetrical without gross deformities. Good, equal movement & strength bilaterally. Pulses:  Normal pulses noted. Extremities:  No clubbing or edema.  No cyanosis. Neurologic:  Alert and oriented x3;  grossly normal neurologically. Skin:  Intact without significant lesions or rashes. No jaundice. Psych:  Alert and cooperative. Normal mood and affect.  Imaging Studies: Reviewed  Assessment and Plan:   Carolyn Roy is a 56 y.o. female with metabolic syndrome, stage III breast cancer, ER positive currently on chemo, has mild normocytic anemia, opioid dependence, chronic tobacco use is seen in consultation for chronic constipation and to discuss about colonoscopy  Chronic constipation Recommend to start Linzess to 90 mcg daily, samples provided Discussed about high-fiber diet, information provided Adequate intake of water  Colon cancer screening Recommend colonoscopy with 2-day prep   Follow up in 6 months   Carolyn Darby, MD

## 2021-12-22 NOTE — Patient Instructions (Signed)
Gave Linzess 290 samples. Please let us know they work for they work and we can call you in a prescription.   High-Fiber Eating Plan Fiber, also called dietary fiber, is a type of carbohydrate. It is found foods such as fruits, vegetables, whole grains, and beans. A high-fiber diet can have many health benefits. Your health care provider may recommend a high-fiber diet to help: Prevent constipation. Fiber can make your bowel movements more regular. Lower your cholesterol. Relieve the following conditions: Inflammation of veins in the anus (hemorrhoids). Inflammation of specific areas of the digestive tract (uncomplicated diverticulosis). A problem of the large intestine, also called the colon, that sometimes causes pain and diarrhea (irritable bowel syndrome, or IBS). Prevent overeating as part of a weight-loss plan. Prevent heart disease, type 2 diabetes, and certain cancers. What are tips for following this plan? Reading food labels  Check the nutrition facts label on food products for the amount of dietary fiber. Choose foods that have 5 grams of fiber or more per serving. The goals for recommended daily fiber intake include: Men (age 38 or younger): 34-38 g. Men (over age 37): 28-34 g. Women (age 1 or younger): 25-28 g. Women (over age 34): 22-25 g. Your daily fiber goal is _____________ g. Shopping Choose whole fruits and vegetables instead of processed forms, such as apple juice or applesauce. Choose a wide variety of high-fiber foods such as avocados, lentils, oats, and kidney beans. Read the nutrition facts label of the foods you choose. Be aware of foods with added fiber. These foods often have high sugar and sodium amounts per serving. Cooking Use whole-grain flour for baking and cooking. Cook with brown rice instead of white rice. Meal planning Start the day with a breakfast that is high in fiber, such as a cereal that contains 5 g of fiber or more per serving. Eat breads  and cereals that are made with whole-grain flour instead of refined flour or white flour. Eat brown rice, bulgur wheat, or millet instead of white rice. Use beans in place of meat in soups, salads, and pasta dishes. Be sure that half of the grains you eat each day are whole grains. General information You can get the recommended daily intake of dietary fiber by: Eating a variety of fruits, vegetables, grains, nuts, and beans. Taking a fiber supplement if you are not able to take in enough fiber in your diet. It is better to get fiber through food than from a supplement. Gradually increase how much fiber you consume. If you increase your intake of dietary fiber too quickly, you may have bloating, cramping, or gas. Drink plenty of water to help you digest fiber. Choose high-fiber snacks, such as berries, raw vegetables, nuts, and popcorn. What foods should I eat? Fruits Berries. Pears. Apples. Oranges. Avocado. Prunes and raisins. Dried figs. Vegetables Sweet potatoes. Spinach. Kale. Artichokes. Cabbage. Broccoli. Cauliflower. Green peas. Carrots. Squash. Grains Whole-grain breads. Multigrain cereal. Oats and oatmeal. Brown rice. Barley. Bulgur wheat. [place]. Quinoa. Bran muffins. Popcorn. Rye wafer crackers. Meats and other proteins Navy beans, kidney beans, and pinto beans. Soybeans. Split peas. Lentils. Nuts and seeds. Dairy Fiber-fortified yogurt. Beverages Fiber-fortified soy milk. Fiber-fortified orange juice. Other foods Fiber bars. The items listed above may not be a complete list of recommended foods and beverages. Contact a dietitian for more information. What foods should I avoid? Fruits Fruit juice. Cooked, strained fruit. Vegetables Fried potatoes. Canned vegetables. Well-cooked vegetables. Grains White bread. Pasta made with refined flour. White  rice. Meats and other proteins Fatty cuts of meat. Fried chicken or fried fish. Dairy Milk. Yogurt. Cream cheese. Sour  cream. Fats and oils Butters. Beverages Soft drinks. Other foods Cakes and pastries. The items listed above may not be a complete list of foods and beverages to avoid. Talk with your dietitian about what choices are best for you. Summary Fiber is a type of carbohydrate. It is found in foods such as fruits, vegetables, whole grains, and beans. A high-fiber diet has many benefits. It can help to prevent constipation, lower blood cholesterol, aid weight loss, and reduce your risk of heart disease, diabetes, and certain cancers. Increase your intake of fiber gradually. Increasing fiber too quickly may cause cramping, bloating, and gas. Drink plenty of water while you increase the amount of fiber you consume. The best sources of fiber include whole fruits and vegetables, whole grains, nuts, seeds, and beans. This information is not intended to replace advice given to you by your health care provider. Make sure you discuss any questions you have with your health care provider. Document Revised: 05/30/2019 Document Reviewed: 05/30/2019 Elsevier Patient Education  [place].

## 2022-01-02 ENCOUNTER — Other Ambulatory Visit: Payer: Self-pay | Admitting: Nurse Practitioner

## 2022-01-04 NOTE — Telephone Encounter (Signed)
Requested Prescriptions  Pending Prescriptions Disp Refills   famotidine (PEPCID) 20 MG tablet [Pharmacy Med Name: FAMOTIDINE 20 MG TABLET] 90 tablet 3    Sig: TAKE 1 TABLET BY MOUTH EVERY DAY     Gastroenterology:  [facility] Passed - 01/02/2022  5:52 PM      Passed - Valid encounter within last 12 months    Recent Outpatient Visits           1 month ago Type 2 diabetes mellitus with hyperglycemia, with long-term current use of insulin ([place])   [place], Jolene T, NP   4 months ago Type 2 diabetes mellitus with hyperglycemia, with long-term current use of insulin ([place])   [place], Jolene T, NP   5 months ago Type 2 diabetes mellitus with hyperglycemia, with long-term current use of insulin ([place])   [place], Jolene T, NP   7 months ago Type 2 diabetes mellitus with hyperglycemia, with long-term current use of insulin ([place])   [place], Jolene T, NP   10 months ago Type 2 diabetes mellitus with hyperglycemia, with long-term current use of insulin ([place])   [place], Barbaraann Faster, NP       Future Appointments             In 2 months [place], Barbaraann Faster, NP [facility]

## 2022-01-05 ENCOUNTER — Encounter
Admission: RE | Disposition: A | Discharge status: Home / Self Care | Payer: Self-pay | Source: Home / Self Care | Attending: Gastroenterology

## 2022-01-05 ENCOUNTER — Ambulatory Visit: Payer: No Typology Code available for payment source | Admitting: Registered Nurse

## 2022-01-05 ENCOUNTER — Encounter: Payer: Self-pay | Admitting: Gastroenterology

## 2022-01-05 ENCOUNTER — Ambulatory Visit
Admission: RE | Admit: 2022-01-05 | Discharge: 2022-01-05 | Disposition: A | Discharge status: Home / Self Care | Payer: No Typology Code available for payment source | Attending: Gastroenterology | Admitting: Gastroenterology

## 2022-01-05 DIAGNOSIS — Z86010 Personal history of colonic polyps: Secondary | ICD-10-CM

## 2022-01-05 DIAGNOSIS — K644 Residual hemorrhoidal skin tags: Secondary | ICD-10-CM | POA: Insufficient documentation

## 2022-01-05 DIAGNOSIS — Z1211 Encounter for screening for malignant neoplasm of colon: Secondary | ICD-10-CM | POA: Insufficient documentation

## 2022-01-05 HISTORY — PX: COLONOSCOPY WITH PROPOFOL: SHX5780

## 2022-01-05 LAB — GLUCOSE, CAPILLARY: Glucose-Capillary: 254 mg/dL — ABNORMAL HIGH (ref 70–99)

## 2022-01-05 SURGERY — COLONOSCOPY WITH PROPOFOL
Anesthesia: General

## 2022-01-05 MED ORDER — SODIUM CHLORIDE 0.9 % IV SOLN
INTRAVENOUS | Status: DC
  Administered 2022-01-05: 1000 mL via INTRAVENOUS

## 2022-01-05 MED ORDER — PROPOFOL 500 MG/50ML IV EMUL
INTRAVENOUS | Status: DC | PRN
  Administered 2022-01-05: 192.543 ug/kg/min via INTRAVENOUS

## 2022-01-05 MED ORDER — PROPOFOL 10 MG/ML IV BOLUS
INTRAVENOUS | Status: DC | PRN
  Administered 2022-01-05: 90 mg via INTRAVENOUS

## 2022-01-05 MED ORDER — LIDOCAINE HCL (CARDIAC) PF 100 MG/5ML IV SOSY
PREFILLED_SYRINGE | INTRAVENOUS | Status: DC | PRN
  Administered 2022-01-05: 100 mg via INTRAVENOUS

## 2022-01-05 MED ORDER — PROPOFOL 1000 MG/100ML IV EMUL
INTRAVENOUS | Status: AC
  Filled 2022-01-05: qty 200

## 2022-01-05 MED ORDER — GLYCOPYRROLATE 0.2 MG/ML IJ SOLN
INTRAMUSCULAR | Status: AC
  Filled 2022-01-05: qty 1

## 2022-01-05 MED ORDER — LIDOCAINE HCL (PF) 2 % IJ SOLN
INTRAMUSCULAR | Status: AC
  Filled 2022-01-05: qty 20

## 2022-01-05 NOTE — H&P (Signed)
Cephas Darby, MD [address]  [place]  [place], [place] [address]  Main: [phone]  Fax: [phone] Pager: [phone]  Primary Care Physician:  [name], NP Primary Gastroenterologist:  Dr. Cephas Darby  Pre-Procedure History & Physical: HPI:  Carolyn Roy is a 56 y.o. female is here for an colonoscopy.   Past Medical History:  Diagnosis Date   Allergy    Breast cancer ([place])    Cancer ([place]) 06/15/2017   5.1 cm, T3,N1 (clinical): ER/ PR positive, Her 2 neu not overexpressed, High Ki 67. Neuoadjuvant chemotherapy.    Depression    Diabetes mellitus without complication ([place]) 6270   Edema of left upper extremity    Endometriosis    Family history of breast cancer    Headache    migraines   Hyperlipidemia    Lymphedema of left arm    Ovarian mass    Personal history of chemotherapy    Personal history of radiation therapy    Pneumonia    2018    Past Surgical History:  Procedure Laterality Date   AXILLARY LYMPH NODE BIOPSY Left 07/14/2017   Procedure: INSERTION GEL MARK CLIP LEFT AXILLA;  Surgeon: Robert Bellow, MD;  Location: [facility] ORS;  Service: General;  Laterality: Left;   BREAST BIOPSY Left    Dr Orlene Och BREAST METASTATIC CARCINOMA   BREAST LUMPECTOMY Left 01/12/2018   COLONOSCOPY WITH PROPOFOL N/A 12/27/2019   Procedure: COLONOSCOPY WITH PROPOFOL;  Surgeon: Virgel Manifold, MD;  Location: [facility] END[facility];  Service: Endoscopy;  Laterality: N/A;   OOPHORECTOMY     PARTIAL MASTECTOMY WITH NEEDLE LOCALIZATION Left 01/12/2018   Procedure: PARTIAL MASTECTOMY WITH NEEDLE LOCALIZATION;  Surgeon: Robert Bellow, MD;  Location: [facility];  Service: General;  Laterality: Left;   PORTACATH PLACEMENT Right 07/14/2017   Procedure: INSERTION PORT-A-CATH;  Surgeon: Robert Bellow, MD;  Location: [facility];  Service: General;  Laterality: Right;   SENTINEL NODE BIOPSY Left 01/12/2018   Procedure: SENTINEL NODE BIOPSY;  Surgeon:  Robert Bellow, MD;  Location: [facility];  Service: General;  Laterality: Left;   TUBAL LIGATION      Prior to Admission medications   Medication Sig Start Date End Date Taking? Authorizing Provider  Blood Glucose Monitoring Suppl (ONETOUCH VERIO) w/Device KIT Use to check blood sugar 3 times a day and document results, bring to appointments.  Goal is <130 fasting blood sugar and <180 two hours after meals. 02/12/21  Yes Cannady, Jolene T, NP  DULoxetine (CYMBALTA) 60 MG capsule Take 60 mg by mouth daily.   Yes [provider]  famotidine (PEPCID) 20 MG tablet TAKE 1 TABLET BY MOUTH EVERY DAY 01/04/22  Yes Cannady, Jolene T, NP  glucose blood test strip Use to check blood sugar 3 times daily, fasting in morning with goal <130 and 2 hours after meals with goal <180.  Bring blood sugar log to visits. 11/11/20  Yes Cannady, Jolene T, NP  goserelin (ZOLADEX) 3.6 MG injection Inject 3.6 mg into the skin every 28 (twenty-eight) days.   Yes [provider]  Insulin Glargine (BASAGLAR KWIKPEN) 100 UNIT/ML Inject 10 Units into the skin daily. 12/20/21  Yes Cannady, Jolene T, NP  Insulin Pen Needle (NOVOFINE) 30G X 8 MM MISC Inject 10 each into the skin as needed. 05/14/21  Yes Cannady, Jolene T, NP  lamoTRIgine (LAMICTAL) 25 MG tablet Take 1 tablet (25 mg total) by mouth 2 (two) times daily. 12/03/21  Yes Cannady, Jolene T, NP  linagliptin (TRADJENTA) 5 MG TABS tablet Take 1 tablet (5 mg total) by mouth daily. 12/03/21  Yes Cannady, Jolene T, NP  metFORMIN (GLUCOPHAGE-XR) 500 MG 24 hr tablet Start by taking one tablet (500 MG) twice a day with meals and then increase in one week to two tablets (1000 MG) twice a day with meals. 02/12/21  Yes Cannady, Jolene T, NP  oxyCODONE (OXY IR/ROXICODONE) 5 MG immediate release tablet Take 1 tablet (5 mg total) by mouth 2 (two) times daily as needed for severe pain. Must last 30 days 12/31/21 01/30/22 Yes Milinda Pointer, MD  pregabalin (LYRICA) 150 MG  capsule Take 1 capsule (150 mg total) by mouth 2 (two) times daily. 07/28/21  Yes Cannady, Jolene T, NP  rosuvastatin (CRESTOR) 40 MG tablet Take 1 tablet (40 mg total) by mouth daily. Stop taking Atorvastatin. 02/13/21  Yes Cannady, Jolene T, NP  tamoxifen (NOLVADEX) 10 MG tablet TAKE 1 TABLET BY MOUTH EVERY DAY 10/29/21  Yes Cammie Sickle, MD  Vitamin D, Ergocalciferol, (DRISDOL) 1.25 MG (50000 UNIT) CAPS capsule TAKE 1 CAPSULE BY MOUTH ONE TIME PER WEEK 06/01/21  Yes Cammie Sickle, MD  amitriptyline (ELAVIL) 50 MG tablet TAKE 1 TABLET BY MOUTH EVERYDAY AT BEDTIME 03/15/21   Vaslow, Acey Lav, MD  aspirin-acetaminophen-caffeine (EXCEDRIN MIGRAINE) [phone] MG tablet Take 2 tablets by mouth daily as needed for headache.     [provider]  naloxone [facility]) nasal spray 4 mg/0.1 mL Place 1 spray into the nose as needed for up to 365 doses (for opioid-induced respiratory depresssion). In case of emergency (overdose), spray once into each nostril. If no response within 3 minutes, repeat application and call [phone]. 11/24/21 11/24/22  Milinda Pointer, MD  oxyCODONE (OXY IR/ROXICODONE) 5 MG immediate release tablet Take 1 tablet (5 mg total) by mouth 2 (two) times daily as needed for severe pain. Must last 30 days 12/01/21 12/31/21  Milinda Pointer, MD  oxyCODONE (OXY IR/ROXICODONE) 5 MG immediate release tablet Take 1 tablet (5 mg total) by mouth 2 (two) times daily as needed for severe pain. Must last 30 days 01/30/22 03/01/22  Milinda Pointer, MD  SUMAtriptan (IMITREX) 100 MG tablet Take 1 tablet (100 mg total) by mouth every 2 (two) hours as needed for migraine. May repeat in 2 hours if headache persists or recurs. 05/21/21   Ventura Sellers, MD    Allergies as of 12/22/2021 - Review Complete 12/22/2021  Allergen Reaction Noted   Aspirin Nausea And Vomiting 04/14/2015    Family History  Problem Relation Age of Onset   Colon cancer Mother    Cancer Mother    Other  Father        family hx on dad's side: breast, colon, stomach cancer-biological dad   Diabetes Brother    Pancreatitis Brother    Prostate cancer Brother 42       currently 90 / maternal half-brother   Breast cancer Maternal Aunt 30       currently 41   Breast cancer Maternal Grandmother 43       deceased 2s   Colon cancer Maternal Grandmother    Breast cancer Other 23       mother's sister; deceased 53   Breast cancer Other        mother's sister; age at dx unknown    Social History   Socioeconomic History   Marital status: Married    Spouse name: Not on file  Number of children: Not on file   Years of education: Not on file   Highest education level: Not on file  Occupational History   Not on file  Tobacco Use   Smoking status: Every Day    Packs/day: 1.00    Years: 11.00    Total pack years: 11.00    Types: Cigarettes   Smokeless tobacco: Former    Types: Snuff  Vaping Use   Vaping Use: Never used  Substance and Sexual Activity   Alcohol use: No    Alcohol/week: 0.0 standard drinks of alcohol   Drug use: No   Sexual activity: Yes  Other Topics Concern   Not on file  Social History Narrative   ** Merged History Encounter **       Social Determinants of Health   Financial Resource Strain: Low Risk  (07/03/2019)   Overall Financial Resource Strain (CARDIA)    Difficulty of Paying Living Expenses: Not very hard  Food Insecurity: Not on file  Transportation Needs: Not on file  Physical Activity: Not on file  Stress: Not on file  Social Connections: Not on file  Intimate Partner Violence: Not on file    Review of Systems: See HPI, otherwise negative ROS  Physical Exam: BP 110/77   Pulse 73   Temp (!) 96.9 F (36.1 C) (Temporal)   Resp 16   Ht 5' 4" (1.626 m)   Wt 81.8 kg   LMP  (LMP Unknown) Comment: LAST PERIOD IN MAY 2019 WHEN SHE STARTED CHEMO  SpO2 100%   BMI 30.96 kg/m  General:   Alert,  pleasant and cooperative in NAD Head:   Normocephalic and atraumatic. Neck:  Supple; no masses or thyromegaly. Lungs:  Clear throughout to auscultation.    Heart:  Regular rate and rhythm. Abdomen:  Soft, nontender and nondistended. Normal bowel sounds, without guarding, and without rebound.   Neurologic:  Alert and  oriented x4;  grossly normal neurologically.  Impression/Plan: Carolyn Roy is here for an colonoscopy to be performed for colon cancer screening  Risks, benefits, limitations, and alternatives regarding  colonoscopy have been reviewed with the patient.  Questions have been answered.  All parties agreeable.   Sherri Sear, MD  01/05/2022, 8:00 AM

## 2022-01-05 NOTE — Anesthesia Preprocedure Evaluation (Addendum)
Anesthesia Evaluation  Patient identified by MRN, date of birth, ID band Patient awake    Reviewed: Allergy & Precautions, H&P , NPO status , Patient's Chart, lab work & pertinent test results, reviewed documented beta blocker date and time   Airway Mallampati: II   Neck ROM: full    Dental  (+) Poor Dentition   Pulmonary pneumonia, resolved, Current Smoker and Patient abstained from smoking.   Pulmonary exam normal        Cardiovascular Exercise Tolerance: Good negative cardio ROS Normal cardiovascular exam Rhythm:regular Rate:Normal     Neuro/Psych  Headaches PSYCHIATRIC DISORDERS  Depression     Neuromuscular disease    GI/Hepatic Neg liver ROS,GERD  Medicated,,  Endo/Other  diabetes, Well Controlled    Renal/GU negative Renal ROS  negative genitourinary   Musculoskeletal   Abdominal   Peds  Hematology negative hematology ROS (+)   Anesthesia Other Findings Past Medical History: No date: Allergy No date: Breast cancer ([place]) 06/15/2017: Cancer ([place])     Comment:  5.1 cm, T3,N1 (clinical): ER/ PR positive, Her 2 neu not              overexpressed, High Ki 67. Neuoadjuvant chemotherapy.  No date: Depression 2017: Diabetes mellitus without complication ([facility]) No date: Edema of left upper extremity No date: Endometriosis No date: Family history of breast cancer No date: Headache     Comment:  migraines No date: Hyperlipidemia No date: Lymphedema of left arm No date: Ovarian mass No date: Personal history of chemotherapy No date: Personal history of radiation therapy No date: Pneumonia     Comment:  2018 Past Surgical History: 07/14/2017: AXILLARY LYMPH NODE BIOPSY; Left     Comment:  Procedure: INSERTION GEL MARK CLIP LEFT AXILLA;                Surgeon: Robert Bellow, MD;  Location: [facility];                Service: General;  Laterality: Left; No date: BREAST BIOPSY; Left     Comment:  Dr  Orlene Och BREAST METASTATIC CARCINOMA 01/12/2018: BREAST LUMPECTOMY; Left 12/27/2019: COLONOSCOPY WITH PROPOFOL; N/A     Comment:  Procedure: COLONOSCOPY WITH PROPOFOL;  Surgeon:               Virgel Manifold, MD;  Location: [facility];                Service: Endoscopy;  Laterality: N/A; No date: OOPHORECTOMY 01/12/2018: PARTIAL MASTECTOMY WITH NEEDLE LOCALIZATION; Left     Comment:  Procedure: PARTIAL MASTECTOMY WITH NEEDLE LOCALIZATION;               Surgeon: Robert Bellow, MD;  Location: [facility];                Service: General;  Laterality: Left; 07/14/2017: PORTACATH PLACEMENT; Right     Comment:  Procedure: INSERTION PORT-A-CATH;  Surgeon: Robert Bellow, MD;  Location: [facility];  Service: General;                Laterality: Right; 01/12/2018: SENTINEL NODE BIOPSY; Left     Comment:  Procedure: SENTINEL NODE BIOPSY;  Surgeon: Robert Bellow, MD;  Location: [facility];  Service: General;  Laterality: Left; No date: TUBAL LIGATION   Reproductive/Obstetrics negative OB ROS                              Anesthesia Physical Anesthesia Plan  ASA: 3  Anesthesia Plan: General   Post-op Pain Management:    Induction:   PONV Risk Score and Plan:   Airway Management Planned:   Additional Equipment:   Intra-op Plan:   Post-operative Plan:   Informed Consent: I have reviewed the patients History and Physical, chart, labs and discussed the procedure including the risks, benefits and alternatives for the proposed anesthesia with the patient or authorized representative who has indicated his/her understanding and acceptance.     Dental Advisory Given  Plan Discussed with: CRNA  Anesthesia Plan Comments:         Anesthesia Quick Evaluation

## 2022-01-05 NOTE — Transfer of Care (Signed)
Immediate Anesthesia Transfer of Care Note  Patient: Carolyn Roy  Procedure(s) Performed: COLONOSCOPY WITH PROPOFOL  Patient Location: Endoscopy Unit  Anesthesia Type:General  Level of Consciousness: drowsy  Airway & Oxygen Therapy: Patient Spontanous Breathing  Post-op Assessment: Report given to RN and Post -op Vital signs reviewed and stable  Post vital signs: Reviewed and stable  Last Vitals:  Vitals Value Taken Time  BP 107/57 01/05/22 0833  Temp    Pulse 69 01/05/22 0833  Resp    SpO2 97 % 01/05/22 0833  Vitals shown include unvalidated device data.  Last Pain:  Vitals:   01/05/22 0723  TempSrc: Temporal  PainSc: 0-No pain         Complications: No notable events documented.

## 2022-01-05 NOTE — Anesthesia Postprocedure Evaluation (Signed)
Anesthesia Post Note  Patient: Carolyn Roy  Procedure(s) Performed: COLONOSCOPY WITH PROPOFOL  Patient location during evaluation: PACU Anesthesia Type: General Level of consciousness: awake and alert Pain management: pain level controlled Vital Signs Assessment: post-procedure vital signs reviewed and stable Respiratory status: spontaneous breathing, nonlabored ventilation, respiratory function stable and patient connected to nasal cannula oxygen Cardiovascular status: blood pressure returned to baseline and stable Postop Assessment: no apparent nausea or vomiting Anesthetic complications: no   No notable events documented.   Last Vitals:  Vitals:   01/05/22 0833 01/05/22 0843  BP: (!) 107/57 119/71  Pulse: 69 65  Resp: 18 (!) 25  Temp: (!) 36.4 C   SpO2: 97% 98%    Last Pain:  Vitals:   01/05/22 0843  TempSrc:   PainSc: 0-No pain                 Molli Barrows

## 2022-01-05 NOTE — Op Note (Signed)
[facility] Patient Name: Carolyn Roy Procedure Date: 01/05/2022 7:12 AM MRN: [id number] Account #: [id number] Date of Birth: 1965-12-30 Admit Type: Outpatient Age: 56 Room: [facility] ENDO ROOM 4 Gender: Female Note Status: Finalized Instrument Name: Park Meo [id number] Procedure:             Colonoscopy Indications:           Screening for colorectal malignant neoplasm, Screening                         for colorectal malignant neoplasm, inadequate bowel                         prep on last colonoscopy (more recent than 10 years                         ago), Last colonoscopy: November 2021 Providers:             Lin Landsman MD, MD Referring MD:          Barbaraann Faster. Ned Card (Referring MD) Medicines:             General Anesthesia Complications:         No immediate complications. Estimated blood loss: None. Procedure:             Pre-Anesthesia Assessment:                        - Prior to the procedure, a History and Physical was                         performed, and patient medications and allergies were                         reviewed. The patient is competent. The risks and                         benefits of the procedure and the sedation options and                         risks were discussed with the patient. All questions                         were answered and informed consent was obtained.                         Patient identification and proposed procedure were                         verified by the physician, the nurse, the                         anesthesiologist, the anesthetist and the technician                         in the pre-procedure area in the procedure room in the                         endoscopy suite. Mental Status Examination: alert and  oriented. Airway Examination: normal oropharyngeal                         airway and neck mobility. Respiratory Examination:                          clear to auscultation. CV Examination: normal.                         Prophylactic Antibiotics: The patient does not require                         prophylactic antibiotics. Prior Anticoagulants: The                         patient has taken no anticoagulant or antiplatelet                         agents. ASA Grade Assessment: III - A patient with                         severe systemic disease. After reviewing the risks and                         benefits, the patient was deemed in satisfactory                         condition to undergo the procedure. The anesthesia                         plan was to use general anesthesia. Immediately prior                         to administration of medications, the patient was                         re-assessed for adequacy to receive sedatives. The                         heart rate, respiratory rate, oxygen saturations,                         blood pressure, adequacy of pulmonary ventilation, and                         response to care were monitored throughout the                         procedure. The physical status of the patient was                         re-assessed after the procedure.                        After obtaining informed consent, the colonoscope was                         passed under direct vision. Throughout the procedure,  the patient's blood pressure, pulse, and oxygen                         saturations were monitored continuously. The                         Colonoscope was introduced through the anus and                         advanced to the the cecum, identified by appendiceal                         orifice and ileocecal valve. The colonoscopy was                         performed without difficulty. The patient tolerated                         the procedure well. The quality of the bowel                         preparation was evaluated using the BBPS [facility] Bowel                          Preparation Scale) with scores of: Right Colon = 3,                         Transverse Colon = 3 and Left Colon = 3 (entire mucosa                         seen well with no residual staining, small fragments                         of stool or opaque liquid). The total BBPS score                         equals 9. The ileocecal valve, appendiceal orifice,                         and rectum were photographed. Findings:      The perianal and digital rectal examinations were normal. Pertinent       negatives include normal sphincter tone and no palpable rectal lesions.      The entire examined colon appeared normal.      Non-bleeding external hemorrhoids were found during retroflexion. The       hemorrhoids were medium-sized. Impression:            - The entire examined colon is normal.                        - Non-bleeding external hemorrhoids.                        - No specimens collected. Recommendation:        - Discharge patient to home (with escort).                        - Resume previous diet today.                        -  Continue present medications.                        - Repeat colonoscopy in 10 years for screening                         purposes. Procedure Code(s):     --- Professional ---                        F7903, Colorectal cancer screening; colonoscopy on                         individual not meeting criteria for high risk Diagnosis Code(s):     --- Professional ---                        K64.4, Residual hemorrhoidal skin tags                        Z12.11, Encounter for screening for malignant neoplasm                         of colon CPT copyright 2022 American Medical Association. All rights reserved. The codes documented in this report are preliminary and upon coder review may  be revised to meet current compliance requirements. Dr. Ulyess Mort Lin Landsman MD, MD 01/05/2022 8:31:52 AM This report has been signed electronically. Number of  Addenda: 0 Note Initiated On: 01/05/2022 7:12 AM Scope Withdrawal Time: 0 hours 12 minutes 30 seconds  Total Procedure Duration: 0 hours 16 minutes 18 seconds  Estimated Blood Loss:  Estimated blood loss: none.      [facility]

## 2022-01-06 ENCOUNTER — Encounter: Payer: Self-pay | Admitting: Gastroenterology

## 2022-01-20 ENCOUNTER — Other Ambulatory Visit: Payer: Self-pay | Admitting: Nurse Practitioner

## 2022-01-20 ENCOUNTER — Other Ambulatory Visit: Payer: Self-pay | Admitting: Internal Medicine

## 2022-01-20 NOTE — Telephone Encounter (Signed)
Requested medication (s) are due for refill today: yes  Requested medication (s) are on the active medication list: yes  Last refill:  07/28/21 #60/2  Future visit scheduled: yes  Notes to clinic:  Unable to refill per protocol, cannot delegate.      Requested Prescriptions  Pending Prescriptions Disp Refills   pregabalin (LYRICA) 150 MG capsule [Pharmacy Med Name: PREGABALIN 150 MG CAPSULE] 60 capsule 2    Sig: TAKE 1 CAPSULE BY MOUTH TWICE A DAY     Not Delegated - Neurology:  Anticonvulsants - Controlled - pregabalin Failed - 01/20/2022  4:35 PM      Failed - This refill cannot be delegated      Failed - Cr in normal range and within 360 days    Creatinine, Ser  Date Value Ref Range Status  12/03/2021 1.02 (H) 0.57 - 1.00 mg/dL Final         Passed - Completed PHQ-2 or PHQ-9 in the last 360 days      Passed - Valid encounter within last 12 months    Recent Outpatient Visits           1 month ago Type 2 diabetes mellitus with hyperglycemia, with long-term current use of insulin ([place])   [place], Jolene T, NP   4 months ago Type 2 diabetes mellitus with hyperglycemia, with long-term current use of insulin ([place])   [place], Jolene T, NP   5 months ago Type 2 diabetes mellitus with hyperglycemia, with long-term current use of insulin ([place])   [place], Jolene T, NP   8 months ago Type 2 diabetes mellitus with hyperglycemia, with long-term current use of insulin ([place])   [place], Jolene T, NP   11 months ago Type 2 diabetes mellitus with hyperglycemia, with long-term current use of insulin ([place])   [place], Barbaraann Faster, NP       Future Appointments             In 1 month Cannady, Barbaraann Faster, NP [employer]

## 2022-02-11 ENCOUNTER — Encounter: Payer: Self-pay | Admitting: Internal Medicine

## 2022-02-17 ENCOUNTER — Other Ambulatory Visit: Payer: Self-pay | Admitting: Nurse Practitioner

## 2022-02-17 NOTE — Telephone Encounter (Signed)
Unable to refill per protocol, Rx request is too soon. Last refill 02/13/21 for 90 days and 4 refills.  Requested Prescriptions  Pending Prescriptions Disp Refills   metFORMIN (GLUCOPHAGE-XR) 500 MG 24 hr tablet [Pharmacy Med Name: METFORMIN HCL ER 500 MG TABLET] 360 tablet 4    Sig: START BY TAKING ONE TABLET (500 MG) TWICE A DAY WITH MEALS AND THEN INCREASE IN ONE WEEK TO TWO TABLETS (1000 MG) TWICE A DAY WITH MEALS.     Endocrinology:  Diabetes - Biguanides Failed - 02/17/2022  1:32 AM      Failed - Cr in normal range and within 360 days    Creatinine, Ser  Date Value Ref Range Status  12/03/2021 1.02 (H) 0.57 - 1.00 mg/dL Final         Failed - B12 Level in normal range and within 720 days    Vitamin B-12  Date Value Ref Range Status  10/18/2019 424 232 - 1,245 pg/mL Final         Passed - HBA1C is between 0 and 7.9 and within 180 days    HB A1C (BAYER DCA - WAIVED)  Date Value Ref Range Status  12/03/2021 7.7 (H) 4.8 - 5.6 % Final    Comment:             Prediabetes: 5.7 - 6.4          Diabetes: >6.4          Glycemic control for adults with diabetes: <7.0          Passed - eGFR in normal range and within 360 days    GFR calc Af Amer  Date Value Ref Range Status  10/21/2019 >60 >60 mL/min Final   GFR, Estimated  Date Value Ref Range Status  11/29/2021 >60 >60 mL/min Final    Comment:    (NOTE) Calculated using the CKD-EPI Creatinine Equation (2021)    eGFR  Date Value Ref Range Status  12/03/2021 65 >59 mL/min/1.73 Final         Passed - Valid encounter within last 6 months    Recent Outpatient Visits           2 months ago Type 2 diabetes mellitus with hyperglycemia, with long-term current use of insulin ([place])   [place], Jolene T, NP   5 months ago Type 2 diabetes mellitus with hyperglycemia, with long-term current use of insulin ([place])   [place], Jolene T, NP   6 months ago Type 2 diabetes mellitus with  hyperglycemia, with long-term current use of insulin ([place])   [place], Jolene T, NP   9 months ago Type 2 diabetes mellitus with hyperglycemia, with long-term current use of insulin ([place])   [place], [place] T, NP   1 year ago Type 2 diabetes mellitus with hyperglycemia, with long-term current use of insulin ([place])   [place], [employer] T, NP       Future Appointments             In 2 weeks Cannady, Barbaraann Faster, NP [facility]            Passed - CBC within normal limits and completed in the last 12 months    WBC  Date Value Ref Range Status  11/29/2021 7.8 4.0 - 10.5 K/uL Final   RBC  Date Value Ref Range Status  11/29/2021 3.71 (L) 3.87 - 5.11 MIL/uL  Final   Hemoglobin  Date Value Ref Range Status  11/29/2021 11.8 (L) 12.0 - 15.0 g/dL Final  12/21/2020 14.9 11.1 - 15.9 g/dL Final   HCT  Date Value Ref Range Status  11/29/2021 36.3 36.0 - 46.0 % Final   Hematocrit  Date Value Ref Range Status  12/21/2020 44.8 34.0 - 46.6 % Final   MCHC  Date Value Ref Range Status  11/29/2021 32.5 30.0 - 36.0 g/dL Final   [facility]  Date Value Ref Range Status  11/29/2021 31.8 26.0 - 34.0 pg Final   MCV  Date Value Ref Range Status  11/29/2021 97.8 80.0 - 100.0 fL Final  12/21/2020 96 79 - 97 fL Final   No results found for: "PLTCOUNTKUC", "LABPLAT", "POCPLA" RDW  Date Value Ref Range Status  11/29/2021 13.2 11.5 - 15.5 % Final  12/21/2020 11.7 11.7 - 15.4 % Final          rosuvastatin (CRESTOR) 40 MG tablet [Pharmacy Med Name: ROSUVASTATIN CALCIUM 40 MG TAB] 90 tablet 4    Sig: TAKE 1 TABLET (40 MG TOTAL) BY MOUTH DAILY. STOP TAKING ATORVASTATIN.     Cardiovascular:  Antilipid - Statins 2 Failed - 02/17/2022  1:32 AM      Failed - Cr in normal range and within 360 days    Creatinine, Ser  Date Value Ref Range Status  12/03/2021 1.02 (H) 0.57 - 1.00 mg/dL Final         Failed - Lipid  Panel in normal range within the last 12 months    Cholesterol, Total  Date Value Ref Range Status  12/03/2021 103 100 - 199 mg/dL Final   Cholesterol [place], [place]  Date Value Ref Range Status  11/15/2016 WILL FOLLOW  Preliminary   LDL Chol Calc (NIH)  Date Value Ref Range Status  12/03/2021 33 0 - 99 mg/dL Final   HDL  Date Value Ref Range Status  12/03/2021 56 >39 mg/dL Final   Triglycerides  Date Value Ref Range Status  12/03/2021 63 0 - 149 mg/dL Final   Triglycerides Piccolo,Waived  Date Value Ref Range Status  11/15/2016 WILL FOLLOW  Preliminary         Passed - Patient is not pregnant      Passed - Valid encounter within last 12 months    Recent Outpatient Visits           2 months ago Type 2 diabetes mellitus with hyperglycemia, with long-term current use of insulin ([place])   [place], Jolene T, NP   5 months ago Type 2 diabetes mellitus with hyperglycemia, with long-term current use of insulin ([place])   [place], Jolene T, NP   6 months ago Type 2 diabetes mellitus with hyperglycemia, with long-term current use of insulin ([place])   [place], Jolene T, NP   9 months ago Type 2 diabetes mellitus with hyperglycemia, with long-term current use of insulin ([place])   [place], [place] T, NP   1 year ago Type 2 diabetes mellitus with hyperglycemia, with long-term current use of insulin ([place])   [place], Barbaraann Faster, NP       Future Appointments             In 2 weeks Cannady, Barbaraann Faster, NP [facility]

## 2022-02-18 ENCOUNTER — Encounter: Payer: Self-pay | Admitting: Internal Medicine

## 2022-02-18 ENCOUNTER — Inpatient Hospital Stay: Payer: BLUE CROSS/BLUE SHIELD | Attending: Internal Medicine | Admitting: Internal Medicine

## 2022-02-18 VITALS — BP 108/67 | HR 85 | Temp 96.7°F | Resp 16 | Ht 64.0 in | Wt 195.2 lb

## 2022-02-18 DIAGNOSIS — R4189 Other symptoms and signs involving cognitive functions and awareness: Secondary | ICD-10-CM

## 2022-02-18 DIAGNOSIS — Z170 Estrogen receptor positive status [ER+]: Secondary | ICD-10-CM | POA: Diagnosis not present

## 2022-02-18 DIAGNOSIS — C50512 Malignant neoplasm of lower-outer quadrant of left female breast: Secondary | ICD-10-CM | POA: Insufficient documentation

## 2022-02-18 DIAGNOSIS — G620 Drug-induced polyneuropathy: Secondary | ICD-10-CM

## 2022-02-18 DIAGNOSIS — T451X5A Adverse effect of antineoplastic and immunosuppressive drugs, initial encounter: Secondary | ICD-10-CM

## 2022-02-18 DIAGNOSIS — G43009 Migraine without aura, not intractable, without status migrainosus: Secondary | ICD-10-CM | POA: Diagnosis not present

## 2022-02-18 DIAGNOSIS — Z5111 Encounter for antineoplastic chemotherapy: Secondary | ICD-10-CM | POA: Insufficient documentation
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
## 2022-02-21 ENCOUNTER — Encounter: Payer: Self-pay | Admitting: Pain Medicine

## 2022-02-21 ENCOUNTER — Ambulatory Visit: Payer: BLUE CROSS/BLUE SHIELD | Attending: Pain Medicine | Admitting: Pain Medicine

## 2022-02-21 VITALS — BP 108/81 | HR 80 | Temp 97.3°F | Ht 64.0 in | Wt 195.0 lb

## 2022-02-21 DIAGNOSIS — R937 Abnormal findings on diagnostic imaging of other parts of musculoskeletal system: Secondary | ICD-10-CM | POA: Diagnosis not present

## 2022-02-21 DIAGNOSIS — M25561 Pain in right knee: Secondary | ICD-10-CM | POA: Diagnosis not present

## 2022-02-21 DIAGNOSIS — G8929 Other chronic pain: Secondary | ICD-10-CM

## 2022-02-21 DIAGNOSIS — Z79899 Other long term (current) drug therapy: Secondary | ICD-10-CM

## 2022-02-21 DIAGNOSIS — M542 Cervicalgia: Secondary | ICD-10-CM

## 2022-02-21 DIAGNOSIS — M47819 Spondylosis without myelopathy or radiculopathy, site unspecified: Secondary | ICD-10-CM | POA: Diagnosis not present

## 2022-02-21 DIAGNOSIS — M5137 Other intervertebral disc degeneration, lumbosacral region: Secondary | ICD-10-CM

## 2022-02-21 DIAGNOSIS — G894 Chronic pain syndrome: Secondary | ICD-10-CM | POA: Diagnosis not present

## 2022-02-21 DIAGNOSIS — M545 Low back pain, unspecified: Secondary | ICD-10-CM

## 2022-02-21 DIAGNOSIS — M4316 Spondylolisthesis, lumbar region: Secondary | ICD-10-CM

## 2022-02-21 DIAGNOSIS — M79642 Pain in left hand: Secondary | ICD-10-CM | POA: Diagnosis not present

## 2022-02-21 DIAGNOSIS — M79671 Pain in right foot: Secondary | ICD-10-CM | POA: Insufficient documentation

## 2022-02-21 DIAGNOSIS — M2540 Effusion, unspecified joint: Secondary | ICD-10-CM

## 2022-02-21 DIAGNOSIS — M25562 Pain in left knee: Secondary | ICD-10-CM | POA: Diagnosis not present

## 2022-02-21 DIAGNOSIS — M25551 Pain in right hip: Secondary | ICD-10-CM | POA: Diagnosis present

## 2022-02-21 DIAGNOSIS — M79672 Pain in left foot: Secondary | ICD-10-CM | POA: Diagnosis present

## 2022-02-21 DIAGNOSIS — M25552 Pain in left hip: Secondary | ICD-10-CM | POA: Diagnosis present

## 2022-02-21 DIAGNOSIS — M47816 Spondylosis without myelopathy or radiculopathy, lumbar region: Secondary | ICD-10-CM | POA: Diagnosis not present

## 2022-02-21 DIAGNOSIS — Z79891 Long term (current) use of opiate analgesic: Secondary | ICD-10-CM

## 2022-02-21 DIAGNOSIS — M79641 Pain in right hand: Secondary | ICD-10-CM

## 2022-02-21 DIAGNOSIS — M51379 Other intervertebral disc degeneration, lumbosacral region without mention of lumbar back pain or lower extremity pain: Secondary | ICD-10-CM

## 2022-02-21 DIAGNOSIS — G893 Neoplasm related pain (acute) (chronic): Secondary | ICD-10-CM

## 2022-02-21 MED ORDER — OXYCODONE HCL 5 MG PO TABS: 5.0000 mg | ORAL_TABLET | Freq: Two times a day (BID) | ORAL | 0 refills | Status: DC | PRN

## 2022-02-21 NOTE — Progress Notes (Signed)
Nursing Pain Medication Assessment:  Safety precautions to be maintained throughout the outpatient stay will include: orient to surroundings, keep bed in low position, maintain call bell within reach at all times, provide assistance with transfer out of bed and ambulation.  Medication Inspection Compliance: Pill count conducted under aseptic conditions, in front of the patient. Neither the pills nor the bottle was removed from the patient's sight at any time. Once count was completed pills were immediately returned to the patient in their original bottle.  Medication: Oxycodone IR Pill/Patch Count:  1 of 60 pills remain Pill/Patch Appearance: Markings consistent with prescribed medication Bottle Appearance: Standard pharmacy container. Clearly labeled. Filled Date: 81 / 15 / 2023 Last Medication intake:  TodaySafety precautions to be maintained throughout the outpatient stay will include: orient to surroundings, keep bed in low position, maintain call bell within reach at all times, provide assistance with transfer out of bed and ambulation.

## 2022-02-21 NOTE — Patient Instructions (Signed)
____________________________________________________________________________________________  Patient Information update  To: All of our patients.  Re: Name change.  It has been made official that our current name, "[place]"   will soon be changed to "[facility]".   The purpose of this change is to eliminate any confusion created by the concept of our practice being a "Medication Management Pain Clinic". In the past this has led to the misconception that we treat pain primarily by the use of prescription medications.  Nothing can be farther from the truth.   Understanding PAIN MANAGEMENT: To further understand what our practice does, you first have to understand that "Pain Management" is a subspecialty that requires additional training once a physician has completed their specialty training, which can be in either Anesthesia, Neurology, Psychiatry, or Physical Medicine and Rehabilitation (PMR). Each one of these contributes to the final approach taken by each physician to the management of their patient's pain. To be a "Pain Management Specialist" you must have first completed one of the specialty trainings below.  Anesthesiologists - trained in clinical pharmacology and interventional techniques such as nerve blockade and regional as well as central neuroanatomy. They are trained to block pain before, during, and after surgical interventions.  Neurologists - trained in the diagnosis and pharmacological treatment of complex neurological conditions, such as Multiple Sclerosis, Parkinson's, spinal cord injuries, and other systemic conditions that may be associated with symptoms that may include but are not limited to pain. They tend to rely primarily on the treatment of chronic pain using prescription medications.  Psychiatrist - trained in conditions affecting the psychosocial  wellbeing of patients including but not limited to depression, anxiety, schizophrenia, personality disorders, addiction, and other substance use disorders that may be associated with chronic pain. They tend to rely primarily on the treatment of chronic pain using prescription medications.   Physical Medicine and Rehabilitation (PMR) physicians, also known as physiatrists - trained to treat a wide variety of medical conditions affecting the brain, spinal cord, nerves, bones, joints, ligaments, muscles, and tendons. Their training is primarily aimed at treating patients that have suffered injuries that have caused severe physical impairment. Their training is primarily aimed at the physical therapy and rehabilitation of those patients. They may also work alongside orthopedic surgeons or neurosurgeons using their expertise in assisting surgical patients to recover after their surgeries.  INTERVENTIONAL PAIN MANAGEMENT is sub-subspecialty of Pain Management.  Our physicians are Board-certified in Anesthesia, Pain Management, and Interventional Pain Management.  This meaning that not only have they been trained and Board-certified in their specialty of Anesthesia, and subspecialty of Pain Management, but they have also received further training in the sub-subspecialty of Interventional Pain Management, in order to become Board-certified as INTERVENTIONAL PAIN MANAGEMENT SPECIALIST.    Mission: Our goal is to use our skills in  [place] as alternatives to the chronic use of prescription opioid medications for the treatment of pain. To make this more clear, we have changed our name to reflect what we do and offer. We will continue to offer medication management assessment and recommendations, but we will not be taking over any patient's medication management.  ____________________________________________________________________________________________      ____________________________________________________________________________________________  National Pain Medication Shortage  The [place] is experiencing worsening drug shortages. These have had a negative widespread effect on patient care and treatment. Not expected to improve any time soon. Predicted to last past 2029.   Drug shortage list (generic  names) Oxycodone IR Oxycodone/APAP Oxymorphone IR Hydromorphone Hydrocodone/APAP Morphine  Where is the problem?  Manufacturing and supply level.  Will this shortage affect you?  Only if you take any of the above pain medications.  How? You may be unable to fill your prescription.  Your pharmacist may offer a "partial fill" of your prescription. (Warning: Do not accept partial fills.) Prescriptions partially filled cannot be transferred to another pharmacy. Read our Medication Rules and Regulation. Depending on how much medicine you are dependent on, you may experience withdrawals when unable to get the medication.  Recommendations: Consider ending your dependence on opioid pain medications. Ask your pain specialist to assist you with the process. Consider switching to a medication currently not in shortage, such as Buprenorphine. Talk to your pain specialist about this option. Consider decreasing your pain medication requirements by managing tolerance thru "Drug Holidays". This may help minimize withdrawals, should you run out of medicine. Control your pain thru the use of non-pharmacological interventional therapies.   Your prescriber: Prescribers cannot be blamed for shortages. Medication manufacturing and supply issues cannot be fixed by the prescriber.   NOTE: The prescriber is not responsible for supplying the medication, or solving supply issues. Work with your pharmacist to solve it. The patient is responsible for the decision to take or continue taking the medication and for identifying and securing a legal supply source. By  law, supplying the medication is the job and responsibility of the pharmacy. The prescriber is responsible for the evaluation, monitoring, and prescribing of these medications.   Prescribers will NOT: Re-issue prescriptions that have been partially filled. Re-issue prescriptions already sent to a pharmacy.  Re-send prescriptions to a different pharmacy because yours did not have your medication. Ask pharmacist to order more medicine or transfer the prescription to another pharmacy. (Read below.)  New 2023 regulation: "October 08, 2021 Revised Regulation Allows DEA-Registered Pharmacies to Transfer Electronic Prescriptions at a Patient's Request [place] Patients now have the ability to request their electronic prescription be transferred to another pharmacy without having to go back to their practitioner to initiate the request. This revised regulation went into effect on Monday, October 04, 2021.     At a patient's request, a DEA-registered retail pharmacy can now transfer an electronic prescription for a controlled substance (schedules II-V) to another DEA-registered retail pharmacy. Prior to this change, patients would have to go through their practitioner to cancel their prescription and have it re-issued to a different pharmacy. The process was taxing and time consuming for both patients and practitioners.    The Drug Enforcement Administration [facility]) published its intent to revise the process for transferring electronic prescriptions on December 27, 2019.  The final rule was published in the federal register on September 02, 2021 and went into effect 30 days later.  Under the final rule, a prescription can only be transferred once between pharmacies, and only if allowed under existing state or other applicable law. The prescription must remain in its electronic form; may not be altered in any way; and the transfer must be communicated directly between  two licensed pharmacists. It's important to note, any authorized refills transfer with the original prescription, which means the entire prescription will be filled at the same pharmacy".  Reference: [email or web address] [facility] website announcement)  [email or web address] ([place])   [employer] / Vol. 88, No. 143 / Thursday, September 02, 2021 / Rules and Regulations DEPARTMENT OF JUSTICE  Drug Enforcement  Administration  21 CFR Part 1306  [Docket No. [id number]]  RIN 1117-AB64 Transfer of Electronic Prescriptions for Schedules II-V Controlled Substances Between Pharmacies for Initial Filling  ____________________________________________________________________________________________     _______________________________________________________________________  Medication Rules  Purpose: To inform patients, and their family members, of our medication rules and regulations.  Applies to: All patients receiving prescriptions from our practice (written or electronic).  Pharmacy of record: This is the pharmacy where your electronic prescriptions will be sent. Make sure we have the correct one.  Electronic prescriptions: In compliance with the [place] (STOP) Act of 2017 (Session Lanny Cramp [phone]), effective February 07, 2018, all controlled substances must be electronically prescribed. Written prescriptions, faxing, or calling prescriptions to a pharmacy will no longer be done.  Prescription refills: These will be provided only during in-person appointments. No medications will be renewed without a "face-to-face" evaluation with your provider. Applies to all prescriptions.  NOTE: The following applies primarily to controlled substances (Opioid* Pain Medications).   Type of encounter  (visit): For patients receiving controlled substances, face-to-face visits are required. (Not an option and not up to the patient.)  Patient's responsibilities: Pain Pills: Bring all pain pills to every appointment (except for procedure appointments). Pill Bottles: Bring pills in original pharmacy bottle. Bring bottle, even if empty. Always bring the bottle of the most recent fill.  Medication refills: You are responsible for knowing and keeping track of what medications you are taking and when is it that you will need a refill. The day before your appointment: write a list of all prescriptions that need to be refilled. The day of the appointment: give the list to the admitting nurse. Prescriptions will be written only during appointments. No prescriptions will be written on procedure days. If you forget a medication: it will not be "Called in", "Faxed", or "electronically sent". You will need to get another appointment to get these prescribed. No early refills. Do not call asking to have your prescription filled early. Partial  or short prescriptions: Occasionally your pharmacy may not have enough pills to fill your prescription.  NEVER ACCEPT a partial fill or a prescription that is short of the total amount of pills that you were prescribed.  With controlled substances the law allows 72 hours for the pharmacy to complete the prescription.  If the prescription is not completed within 72 hours, the pharmacist will require a new prescription to be written. This means that you will be short on your medicine and we WILL NOT send another prescription to complete your original prescription.  Instead, request the pharmacy to send a carrier to a nearby branch to get enough medication to provide you with your full prescription. Prescription Accuracy: You are responsible for carefully inspecting your prescriptions before leaving our office. Have the discharge nurse carefully go over each prescription with you,  before taking them home. Make sure that your name is accurately spelled, that your address is correct. Check the name and dose of your medication to make sure it is accurate. Check the number of pills, and the written instructions to make sure they are clear and accurate. Make sure that you are given enough medication to last until your next medication refill appointment. Taking Medication: Take medication as prescribed. When it comes to controlled substances, taking less pills or less frequently than prescribed is permitted and encouraged. Never take more pills than instructed. Never take the medication more frequently than prescribed.  Inform other Doctors: Always inform, all of  your healthcare providers, of all the medications you take. Pain Medication from other Providers: You are not allowed to accept any additional pain medication from any other Doctor or Healthcare provider. There are two exceptions to this rule. (see below) In the event that you require additional pain medication, you are responsible for notifying us, as stated below. Cough Medicine: Often these contain an opioid, such as codeine or hydrocodone. Never accept or take cough medicine containing these opioids if you are already taking an opioid* medication. The combination may cause respiratory failure and death. Medication Agreement: You are responsible for carefully reading and following our Medication Agreement. This must be signed before receiving any prescriptions from our practice. Safely store a copy of your signed Agreement. Violations to the Agreement will result in no further prescriptions. (Additional copies of our Medication Agreement are available upon request.) Laws, Rules, & Regulations: All patients are expected to follow all Federal and [employer], [employer], Rules, [employer]. Ignorance of the Laws does not constitute a valid excuse.  Illegal drugs and Controlled Substances: The use of illegal substances (including,  but not limited to marijuana and its derivatives) and/or the illegal use of any controlled substances is strictly prohibited. Violation of this rule may result in the immediate and permanent discontinuation of any and all prescriptions being written by our practice. The use of any illegal substances is prohibited. Adopted CDC guidelines & recommendations: Target dosing levels will be at or below 60 MME/day. Use of benzodiazepines** is not recommended.  Exceptions: There are only two exceptions to the rule of not receiving pain medications from other Healthcare Providers. Exception #1 (Emergencies): In the event of an emergency (i.e.: accident requiring emergency care), you are allowed to receive additional pain medication. However, you are responsible for: As soon as you are able, call our office [phone], at any time of the day or night, and leave a message stating your name, the date and nature of the emergency, and the name and dose of the medication prescribed. In the event that your call is answered by a member of our staff, make sure to document and save the date, time, and the name of the person that took your information.  Exception #2 (Planned Surgery): In the event that you are scheduled by another doctor or dentist to have any type of surgery or procedure, you are allowed (for a period no longer than 30 days), to receive additional pain medication, for the acute post-op pain. However, in this case, you are responsible for picking up a copy of our "Post-op Pain Management for Surgeons" handout, and giving it to your surgeon or dentist. This document is available at our office, and does not require an appointment to obtain it. Simply go to our office during business hours (Monday-Thursday from 8:00 AM to 4:00 PM) (Friday 8:00 AM to 12:00 Noon) or if you have a scheduled appointment with [place], prior to your surgery, and ask for it by name. In addition, you are responsible for: calling our office  [phone], at any time of the day or night, and leaving a message stating your name, name of your surgeon, type of surgery, and date of procedure or surgery. Failure to comply with your responsibilities may result in termination of therapy involving the controlled substances. Medication Agreement Violation. Following the above rules, including your responsibilities will help you in avoiding a Medication Agreement Violation ("Breaking your Pain Medication Contract").  Consequences:  Not following the above rules may result  in permanent discontinuation of medication prescription therapy.  *Opioid medications include: morphine, codeine, oxycodone, oxymorphone, hydrocodone, hydromorphone, meperidine, tramadol, tapentadol, buprenorphine, fentanyl, methadone. **Benzodiazepine medications include: diazepam (Valium), alprazolam (Xanax), clonazepam (Klonopine), lorazepam (Ativan), clorazepate (Tranxene), chlordiazepoxide (Librium), estazolam (Prosom), oxazepam (Serax), temazepam (Restoril), triazolam (Halcion) (Last updated: 11/30/2021) ______________________________________________________________________    ______________________________________________________________________  Medication Recommendations and Reminders  Applies to: All patients receiving prescriptions (written and/or electronic).  Medication Rules & Regulations: You are responsible for reading, knowing, and following our "Medication Rules" document. These exist for your safety and that of others. They are not flexible and neither are we. Dismissing or ignoring them is an act of "non-compliance" that may result in complete and irreversible termination of such medication therapy. For safety reasons, "non-compliance" will not be tolerated. As with the U[place] fundamental legal principle of "ignorance of the law is no defense", we will accept no excuses for not having read and knowing the content of documents provided to you by our  practice.  Pharmacy of record:  Definition: This is the pharmacy where your electronic prescriptions will be sent.  We do not endorse any particular pharmacy. It is up to you and your insurance to decide what pharmacy to use.  We do not restrict you in your choice of pharmacy. However, once we write for your prescriptions, we will NOT be re-sending more prescriptions to fix restricted supply problems created by your pharmacy, or your insurance.  The pharmacy listed in the electronic medical record should be the one where you want electronic prescriptions to be sent. If you choose to change pharmacy, simply notify our nursing staff. Changes will be made only during your regular appointments and not over the phone.  Recommendations: Keep all of your pain medications in a safe place, under lock and key, even if you live alone. We will NOT replace lost, stolen, or damaged medication. We do not accept "Police Reports" as proof of medications having been stolen. After you fill your prescription, take 1 week's worth of pills and put them away in a safe place. You should keep a separate, properly labeled bottle for this purpose. The remainder should be kept in the original bottle. Use this as your primary supply, until it runs out. Once it's gone, then you know that you have 1 week's worth of medicine, and it is time to come in for a prescription refill. If you do this correctly, it is unlikely that you will ever run out of medicine. To make sure that the above recommendation works, it is very important that you make sure your medication refill appointments are scheduled at least 1 week before you run out of medicine. To do this in an effective manner, make sure that you do not leave the office without scheduling your next medication management appointment. Always ask the nursing staff to show you in your prescription , when your medication will be running out. Then arrange for the receptionist to get you a  return appointment, at least 7 days before you run out of medicine. Do not wait until you have 1 or 2 pills left, to come in. This is very poor planning and does not take into consideration that we may need to cancel appointments due to bad weather, sickness, or emergencies affecting our staff. DO NOT ACCEPT A "Partial Fill": If for any reason your pharmacy does not have enough pills/tablets to completely fill or refill your prescription, do not allow for a "partial fill". The law allows the pharmacy to complete  that prescription within 72 hours, without requiring a new prescription. If they do not fill the rest of your prescription within those 72 hours, you will need a separate prescription to fill the remaining amount, which we will NOT provide. If the reason for the partial fill is your insurance, you will need to talk to the pharmacist about payment alternatives for the remaining tablets, but again, DO NOT ACCEPT A PARTIAL FILL, unless you can trust your pharmacist to obtain the remainder of the pills within 72 hours.  Prescription refills and/or changes in medication(s):  Prescription refills, and/or changes in dose or medication, will be conducted only during scheduled medication management appointments. (Applies to both, written and electronic prescriptions.) No refills on procedure days. No medication will be changed or started on procedure days. No changes, adjustments, and/or refills will be conducted on a procedure day. Doing so will interfere with the diagnostic portion of the procedure. No phone refills. No medications will be "called into the pharmacy". No Fax refills. No weekend refills. No Holliday refills. No after hours refills.  Remember:  Business hours are:  Monday to Thursday 8:00 AM to 4:00 PM Provider's Schedule: Milinda Pointer, MD - Appointments are:  Medication management: Monday and Wednesday 8:00 AM to 4:00 PM Procedure day: Tuesday and Thursday 7:30 AM to 4:00  PM Gillis Santa, MD - Appointments are:  Medication management: Tuesday and Thursday 8:00 AM to 4:00 PM Procedure day: Monday and Wednesday 7:30 AM to 4:00 PM (Last update: 11/30/2021) ______________________________________________________________________    ____________________________________________________________________________________________  Drug Holidays  What is a "Drug Holiday"? Drug Holiday: is the name given to the process of slowly tapering down and temporarily stopping the pain medication for the purpose of decreasing or eliminating tolerance to the drug.  Benefits Improved effectiveness Decreased required effective dose Improved pain control End dependence on high dose therapy Decrease cost of therapy Uncovering "opioid-induced hyperalgesia". (OIH)  What is "opioid hyperalgesia"? It is a paradoxical increase in pain caused by exposure to opioids. Stopping the opioid pain medication, contrary to the expected, it actually decreases or completely eliminates the pain. Ref.: "A comprehensive review of opioid-induced hyperalgesia". Brion Aliment, et.al. Pain Physician. 2011 Mar-Apr;14(2):145-61.  What is tolerance? Tolerance: the progressive loss of effectiveness of a pain medicine due to repetitive use. A common problem of opioid pain medications.  How long should a "Drug Holiday" last? Effectiveness depends on the patient staying off all opioid pain medicines for a minimum of 14 consecutive days. (2 weeks)  How about just taking less of the medicine? Does not work. Will not accomplish goal of eliminating the excess receptors.  How about switching to a different pain medicine? (AKA. "Opioid rotation") Does not work. Creates the illusion of effectiveness by taking advantage of inaccurate equivalent dose calculations between different opioids. -This "technique" was promoted by studies funded by [employer], such as [employer], creators of  "OxyContin".  Can I stop the medicine "cold [place]"? Depends. You should always coordinate with your Pain Specialist to make the transition as smoothly as possible. Avoid stopping the medicine abruptly without consulting. We recommend a "slow taper".  What is a slow taper? Taper: refers to the gradual decrease in dose.   How do I stop/taper the dose? Slowly. Decrease the daily amount of pills that you take by one (1) pill every seven (7) days. This is called a "slow downward taper". Example: if you normally take four (4) pills per day, drop it to three (3) pills per day  for seven (7) days, then to two (2) pills per day for seven (7) days, then to one (1) per day for seven (7) days, and then stop the medicine. The 14 day "Drug Holiday" starts on the first day without medicine.   Will I experience withdrawals? Unlikely with a slow taper.  What triggers withdrawals? Withdrawals are triggered by the sudden/abrupt stop of high dose opioids. Withdrawals can be avoided by slowly decreasing the dose over a prolonged period of time.  What are withdrawals? Symptoms associated with sudden/abrupt reduction/stopping of high-dose, long-term use of pain medication. Withdrawal are seldom seen on low dose therapy, or patients rarely taking opioid medication.  Early Withdrawal Symptoms may include: Agitation Anxiety Muscle aches Increased tearing Insomnia Runny nose Sweating Yawning  Late symptoms may include: Abdominal cramping Diarrhea Dilated pupils Goose bumps Nausea Vomiting  (Last update: 01/16/2022) ____________________________________________________________________________________________    ____________________________________________________________________________________________  WARNING: CBD (cannabidiol) & Delta (Delta-8 tetrahydrocannabinol) products.   Applicable to:  All individuals currently taking or considering taking CBD (cannabidiol) and, more important, all  patients taking opioid analgesic controlled substances (pain medication). (Example: oxycodone; oxymorphone; hydrocodone; hydromorphone; morphine; methadone; tramadol; tapentadol; fentanyl; buprenorphine; butorphanol; dextromethorphan; meperidine; codeine; etc.)  Introduction:  Recently there has been a drive towards the use of "natural" products for the treatment of different conditions, including pain anxiety and sleep disorders. Marijuana and hemp are two varieties of the cannabis genus plants. Marijuana and its derivatives are illegal, while hemp and its derivatives are not. Cannabidiol (CBD) and tetrahydrocannabinol (THC), are two natural compounds found in plants of the Cannabis genus. They can both be extracted from hemp or marijuana. Both compounds interact with your body's endocannabinoid system in very different ways. CBD is associated with pain relief (analgesia) while THC is associated with the psychoactive effects ("the high") obtained from the use of marijuana products. There are two main types of THC: Delta-9, which comes from the marijuana plant and it is illegal, and Delta-8, which comes from the hemp plant, and it is legal. (Both, Delta-9-THC and Delta-8-THC are psychoactive and give you "the high".)   Legality:  Marijuana and its derivatives: illegal Hemp and its derivatives: Legal (State dependent) UPDATE: (03/26/2021) The Drug Enforcement Agency ([place]) issued a letter stating that "delta" cannabinoids, including Delta-8-THCO and Delta-9-THCO, synthetically derived from hemp do not qualify as hemp and will be viewed as Schedule I drugs. (Schedule I drugs, substances, or chemicals are defined as drugs with no currently accepted medical use and a high potential for abuse. Some examples of Schedule I drugs are: heroin, lysergic acid diethylamide (LSD), marijuana (cannabis), 3,4-methylenedioxymethamphetamine (ecstasy), methaqualone, and peyote.) (https://jennings.com/)  Legal status of CBD in  [place]:  "Conditionally Legal"  Reference: "FDA Regulation of Cannabis and Cannabis-Derived Products, Including Cannabidiol (CBD)" - [employer].pt  Warning:  CBD is not FDA approved and has not undergo the same manufacturing controls as prescription drugs.  This means that the purity and safety of available CBD may be questionable. Most of the time, despite manufacturer's claims, it is contaminated with THC (delta-9-tetrahydrocannabinol - the chemical in marijuana responsible for the "HIGH").  When this is the case, the [facility] contaminant will trigger a positive urine drug screen (UDS) test for Marijuana (carboxy-THC).   The FDA recently put out a warning about 5 things that everyone should be aware of regarding Delta-8 THC: Delta-8 THC products have not been evaluated or approved by the FDA for safe use and may be marketed in ways that put the public health at  risk. The FDA has received adverse event reports involving delta-8 THC-containing products. Delta-8 THC has psychoactive and intoxicating effects. Delta-8 THC manufacturing often involve use of potentially harmful chemicals to create the concentrations of delta-8 THC claimed in the marketplace. The final delta-8 THC product may have potentially harmful by-products (contaminants) due to the chemicals used in the process. Manufacturing of delta-8 THC products may occur in uncontrolled or unsanitary settings, which may lead to the presence of unsafe contaminants or other potentially harmful substances. Delta-8 THC products should be kept out of the reach of children and pets.  NOTE: Because a positive UDS for any illicit substance is a violation of our medication agreement, your opioid analgesics (pain medicine) may be permanently discontinued.  MORE ABOUT CBD  General Information: CBD was discovered in [birth year] and it is a derivative of  the cannabis sativa genus plants (Marijuana and Hemp). It is one of the 113 identified substances found in Marijuana. It accounts for up to 40% of the plant's extract. As of 2018, preliminary clinical studies on CBD included research for the treatment of anxiety, movement disorders, and pain. CBD is available and consumed in multiple forms, including inhalation of smoke or vapor, as an aerosol spray, and by mouth. It may be supplied as an oil containing CBD, capsules, dried cannabis, or as a liquid solution. CBD is thought not to be as psychoactive as THC (delta-9-tetrahydrocannabinol - the chemical in marijuana responsible for the "HIGH"). Studies suggest that CBD may interact with different biological target receptors in the body, including cannabinoid and other neurotransmitter receptors. As of 2018 the mechanism of action for its biological effects has not been determined.  Side-effects  Adverse reactions: Dry mouth, diarrhea, decreased appetite, fatigue, drowsiness, malaise, weakness, sleep disturbances, and others.  Drug interactions:  CBD may interact with medications such as blood-thinners. CBD causes drowsiness on its own and it will increase drowsiness caused by other medications, including antihistamines (such as Benadryl), benzodiazepines (Xanax, Ativan, Valium), antipsychotics, antidepressants, opioids, alcohol and supplements such as kava, melatonin and St. John's Wort.  Other drug interactions: Brivaracetam (Briviact); Caffeine; Carbamazepine (Tegretol); Citalopram (Celexa); Clobazam (Onfi); Eslicarbazepine (Aptiom); Everolimus (Zostress); Lithium; Methadone (Dolophine); Rufinamide (Banzel); Sedative medications (CNS depressants); Sirolimus (Rapamune); Stiripentol (Diacomit); Tacrolimus (Prograf); Tamoxifen ; Soltamox); Topiramate (Topamax); Valproate; Warfarin (Coumadin); Zonisamide. (Last update:  01/17/2022) ____________________________________________________________________________________________   ____________________________________________________________________________________________  Naloxone Nasal Spray  Why am I receiving this medication? [place] STOP ACT requires that all patients taking high dose opioids or at risk of opioids respiratory depression, be prescribed an opioid reversal agent, such as Naloxone (AKA: Narcan).  What is this medication? NALOXONE (nal OX one) treats opioid overdose, which causes slow or shallow breathing, severe drowsiness, or trouble staying awake. Call emergency services after using this medication. You may need additional treatment. Naloxone works by reversing the effects of opioids. It belongs to a group of medications called opioid blockers.  COMMON BRAND NAME(S): Kloxxado, Narcan  What should I tell my care team before I take this medication? They need to know if you have any of these conditions: Heart disease Substance use disorder An unusual or allergic reaction to naloxone, other medications, foods, dyes, or preservatives Pregnant or trying to get pregnant Breast-feeding  When to use this medication? This medication is to be used for the treatment of respiratory depression (less than 8 breaths per minute) secondary to opioid overdose.   How to use this medication? This medication is for use in the nose. Lay the person on their  back. Support their neck with your hand and allow the head to tilt back before giving the medication. The nasal spray should be given into 1 nostril. After giving the medication, move the person onto their side. Do not remove or test the nasal spray until ready to use. Get emergency medical help right away after giving the first dose of this medication, even if the person wakes up. You should be familiar with how to recognize the signs and symptoms of a narcotic overdose. If more doses are needed, give  the additional dose in the other nostril. Talk to your care team about the use of this medication in children. While this medication may be prescribed for children as young as newborns for selected conditions, precautions do apply.  Naloxone Overdosage: If you think you have taken too much of this medicine contact a poison control center or emergency room at once.  NOTE: This medicine is only for you. Do not share this medicine with others.  What if I miss a dose? This does not apply.  What may interact with this medication? This is only used during an emergency. No interactions are expected during emergency use. This list may not describe all possible interactions. Give your health care provider a list of all the medicines, herbs, non-prescription drugs, or dietary supplements you use. Also tell them if you smoke, drink alcohol, or use illegal drugs. Some items may interact with your medicine.  What should I watch for while using this medication? Keep this medication ready for use in the case of an opioid overdose. Make sure that you have the phone number of your care team and local hospital ready. You may need to have additional doses of this medication. Each nasal spray contains a single dose. Some emergencies may require additional doses. After use, bring the treated person to the nearest hospital or call 911. Make sure the treating care team knows that the person has received a dose of this medication. You will receive additional instructions on what to do during and after use of this medication before an emergency occurs.  What side effects may I notice from receiving this medication? Side effects that you should report to your care team as soon as possible: Allergic reactions--skin rash, itching, hives, swelling of the face, lips, tongue, or throat Side effects that usually do not require medical attention (report these to your care team if they continue or are  bothersome): Constipation Dryness or irritation inside the nose Headache Increase in blood pressure Muscle spasms Stuffy nose Toothache This list may not describe all possible side effects. Call your doctor for medical advice about side effects. You may report side effects to FDA at 1-800-FDA-1088.  Where should I keep my medication? Because this is an emergency medication, you should keep it with you at all times.  Keep out of the reach of children and pets. Store between 20 and 25 degrees C (68 and 77 degrees F). Do not freeze. Throw away any unused medication after the expiration date. Keep in original box until ready to use.  NOTE: This sheet is a summary. It may not cover all possible information. If you have questions about this medicine, talk to your doctor, pharmacist, or health care provider.   2023 Elsevier/Gold Standard (2020-10-02 00:00:00)  ____________________________________________________________________________________________

## 2022-02-21 NOTE — Progress Notes (Signed)
PROVIDER NOTE: Information contained herein reflects review and annotations entered in association with encounter. Interpretation of such information and data should be left to medically-trained personnel. Information provided to patient can be located elsewhere in the medical record under "Patient Instructions". Document created using STT-dictation technology, any transcriptional errors that may result from process are unintentional.    Patient: Carolyn Roy  Service Category: E/M  Provider: Gaspar Cola, MD  DOB: 04/12/1965  DOS: 02/21/2022  Referring Provider: [name], NP  MRN: [id number]  Specialty: Interventional Pain Management  PCP: [name], NP  Type: Established Patient  Setting: Ambulatory outpatient    Location: Office  Delivery: Face-to-face     HPI  [name], a 57 y.o. year old female, is here today because of her Chronic pain syndrome [G89.4]. Carolyn Roy primary complain today is Knee Pain (right) Last encounter: My last encounter with her was on 11/24/2021. Pertinent problems: Carolyn Roy has Chronic fatigue; Family history of breast cancer; Chemotherapy-induced neuropathy ([place]); Malignant neoplasm of lower-outer quadrant of left breast of female, estrogen receptor positive ([place]); Chronic feet pain (1ry area of Pain) (Bilateral) (R>L); Neuropathic pain of feet (Bilateral); Chronic knee pain (2ry area of Pain) (Bilateral) (R>L); Chronic hand pain (3ry area of Pain) (Bilateral) (R>L); Chronic pain syndrome; Chronic hip pain (4th area of Pain) (Bilateral) (R>L); Chronic upper extremity pain (3ry area of Pain) (Bilateral) (R>L); Cancer-related pain; Neuropathic pain; Osteoarthritis of knee (Right); Chronic low back pain (Bilateral (L>R) w/o sciatica; Chronic hip pain (Left); Lumbar facet syndrome (Bilateral) (L>R); Idiopathic scoliosis; Chronic sacroiliac joint pain (Left); Chronic pain of knee (Right); Abnormal MRI, lumbar spine (04/20/2018); Lumbar  facet arthropathy; Spondylosis without myelopathy or radiculopathy, lumbosacral region; DDD (degenerative disc disease), lumbosacral; Numbness and tingling of upper extremity (C6/C7 dermatomes) (Right); Personal history of breast cancer; Cervicalgia; Cervical radiculitis (Right); Effusion of knee joint (Right); Tricompartment osteoarthritis of knee (Right); 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 Pain Assessment: Severity of Chronic pain is reported as a 7 /10. Location: Knee Right/Denies. Onset: More than a month ago. Quality: Dull, Aching, Burning, Constant. Timing: Constant. Modifying factor(s): Meds, elevate leg, heating pad. Vitals:  height is '5\' 4"'$  (1.626 m) and weight is 195 lb (88.5 kg). Her temperature is 97.3 F (36.3 C) (abnormal). Her blood pressure is 108/81 and her pulse is 80. Her oxygen saturation is 100%.  BMI: Estimated body mass index is 33.47 kg/m as calculated from the following:   Height as of this encounter: '5\' 4"'$  (1.626 m).   Weight as of this encounter: 195 lb (88.5 kg).  Reason for encounter: medication management.  The patient indicates doing well with the current medication regimen. No adverse reactions or side effects reported to the medications.  The patient indicates having noticed some tolerance to the medication and therefore today we have talked about "Drug Holiday".  I took the opportunity to explain the concept of tolerance and how "Drug Holidays" help control it.  I detailed how to do a slow taper to avoid withdrawals.  The patient understood and she indicated that she will go ahead and try coming off of the medicine for at least 2 weeks to see if she can get rid of that tolerance.  The patient was instructed to give [place] a call if she needs anything from [place].  She understood and accepted.  RTCB: 05/30/2022  Nonopioids transfer  02/03/2020: Pregabalin (Lyrica) and alpha-lipoic acid  Pharmacotherapy Assessment  Analgesic: Oxycodone IR 5 mg, 1 tab PO BID (10 mg/day of oxycodone) (Average: 2 tabs/day = 10 mg/day of oxycodone) MME/day: 15 mg/day.   Monitoring: [place] PMP: PDMP reviewed during this encounter.       Pharmacotherapy: No side-effects or adverse reactions reported. Compliance: No problems identified. Effectiveness: Clinically acceptable.  Chauncey Fischer, RN  02/21/2022  2:52 PM  Sign when Signing Visit Nursing Pain Medication Assessment:  Safety precautions to be maintained throughout the outpatient stay will include: orient to surroundings, keep bed in low position, maintain call bell within reach at all times, provide assistance with transfer out of bed and ambulation.  Medication Inspection Compliance: Pill count conducted under aseptic conditions, in front of the patient. Neither the pills nor the bottle was removed from the patient's sight at any time. Once count was completed pills were immediately returned to the patient in their original bottle.  Medication: Oxycodone IR Pill/Patch Count:  1 of 60 pills remain Pill/Patch Appearance: Markings consistent with prescribed medication Bottle Appearance: Standard pharmacy container. Clearly labeled. Filled Date: 47 / 15 / 2023 Last Medication intake:  TodaySafety precautions to be maintained throughout the outpatient stay will include: orient to surroundings, keep bed in low position, maintain call bell within reach at all times, provide assistance with transfer out of bed and ambulation.     No results found for: "CBDTHCR" No results found for: "D8THCCBX" No results found for: "D9THCCBX"  UDS:  Summary  Date Value Ref Range Status  08/31/2021 Note  Final    Comment:    ==================================================================== [facility] 13 (MW) ==================================================================== Test                              Result       Flag       Units  Drug Present and Declared for Prescription Verification   Oxycodone                      1261         EXPECTED   ng/mg creat   Oxymorphone                    662          EXPECTED   ng/mg creat   Noroxycodone                   2337         EXPECTED   ng/mg creat   Noroxymorphone                 157          EXPECTED   ng/mg creat    Sources of oxycodone are scheduled prescription medications.    Oxymorphone, noroxycodone, and noroxymorphone are expected    metabolites of oxycodone. Oxymorphone is also available as a    scheduled prescription medication.  ==================================================================== Test                      Result    Flag   Units      Ref Range   Creatinine              112              mg/dL      >=  20 ==================================================================== Declared Medications:  The flagging and interpretation on this report are based on the  following declared medications.  Unexpected results may arise from  inaccuracies in the declared medications.   **Note: The testing scope of this panel includes these medications:   Oxycodone (OxyIR)   **Note: The testing scope of this panel does not include the  following reported medications:   Acetaminophen (Excedrin)  Amitriptyline (Elavil)  Aspirin (Excedrin)  Caffeine (Excedrin)  Duloxetine (Cymbalta)  Famotidine (Pepcid)  Goserelin  Insulin (Basaglar)  Linagliptin (Tradjenta)  Metformin (Glucophage)  Potassium (Klor-Con)  Pregabalin (Lyrica)  Rosuvastatin (Crestor)  Sumatriptan (Imitrex)  Tamoxifen (Nolvadex)  Vitamin D2 (Drisdol) ==================================================================== For clinical consultation, please call [phone]. ====================================================================       ROS  Constitutional: Denies any fever or chills Gastrointestinal: No reported hemesis,  hematochezia, vomiting, or acute GI distress Musculoskeletal: Denies any acute onset joint swelling, redness, loss of ROM, or weakness Neurological: No reported episodes of acute onset apraxia, aphasia, dysarthria, agnosia, amnesia, paralysis, loss of coordination, or loss of consciousness  Medication Review  Basaglar KwikPen, DULoxetine, Insulin Pen Needle, OneTouch Verio, SUMAtriptan, Vitamin D (Ergocalciferol), amitriptyline, aspirin-acetaminophen-caffeine, famotidine, glucose blood, goserelin, lamoTRIgine, linagliptin, metFORMIN, naloxone, oxyCODONE, pregabalin, rosuvastatin, and tamoxifen  History Review  Allergy: Ms. Laramie is allergic to aspirin. Drug: Ms. Michie  reports no history of drug use. Alcohol:  reports no history of alcohol use. Tobacco:  reports that she has been smoking cigarettes. She has a 11.00 pack-year smoking history. She has quit using smokeless tobacco.  Her smokeless tobacco use included snuff. Social: Ms. Windle  reports that she has been smoking cigarettes. She has a 11.00 pack-year smoking history. She has quit using smokeless tobacco.  Her smokeless tobacco use included snuff. She reports that she does not drink alcohol and does not use drugs. Medical:  has a past medical history of Allergy, Breast cancer ([place]), Cancer ([place]) (06/15/2017), Depression, Diabetes mellitus without complication ([place]) (7322), Edema of left upper extremity, Endometriosis, Family history of breast cancer, Headache, Hyperlipidemia, Lymphedema of left arm, Ovarian mass, Personal history of chemotherapy, Personal history of radiation therapy, and Pneumonia. Surgical: Ms. Negrette  has a past surgical history that includes Oophorectomy; Tubal ligation; Portacath placement (Right, 07/14/2017); Axillary lymph node biopsy (Left, 07/14/2017); Breast lumpectomy (Left, 01/12/2018); Breast biopsy (Left); Partial mastectomy with needle localization (Left, 01/12/2018); Sentinel node biopsy (Left, 01/12/2018);  Colonoscopy with propofol (N/A, 12/27/2019); and Colonoscopy with propofol (N/A, 01/05/2022). Family: family history includes Breast cancer in an other family member; Breast cancer (age of onset: 6) in her maternal grandmother; Breast cancer (age of onset: 58) in an other family member; Breast cancer (age of onset: 100) in her maternal aunt; Cancer in her mother; Colon cancer in her maternal grandmother and mother; Diabetes in her brother; Other in her father; Pancreatitis in her brother; Prostate cancer (age of onset: 49) in her brother.  Laboratory Chemistry Profile   Renal Lab Results  Component Value Date   BUN 10 12/03/2021   CREATININE 1.02 (H) 12/03/2021   BCR 10 12/03/2021   GFRAA >60 10/21/2019   GFRNONAA >60 11/29/2021    Hepatic Lab Results  Component Value Date   AST 13 12/03/2021   ALT 10 12/03/2021   ALBUMIN 4.0 12/03/2021   ALKPHOS 59 12/03/2021   LIPASE 31 12/01/2020    Electrolytes Lab Results  Component Value Date   NA 141 12/03/2021   K 3.9 12/03/2021   CL 106 12/03/2021   CALCIUM 9.3 12/03/2021  MG 2.1 02/12/2021    Bone Lab Results  Component Value Date   VD25OH 76.3 12/03/2021    Inflammation (CRP: Acute Phase) (ESR: Chronic Phase) Lab Results  Component Value Date   CRP <1 12/21/2020   ESRSEDRATE 3 12/21/2020         Note: Above Lab results reviewed.  Recent Imaging Review  ECHOCARDIOGRAM COMPLETE    ECHOCARDIOGRAM REPORT       Patient Name:   Cesilia A WHITE Date of Exam: 09/15/2021 Medical Rec #:  [id number]         Height:       64.0 in Accession #:    [id number]        Weight:       177.0 lb Date of Birth:  November 16, 1965         BSA:          1.857 m Patient Age:    75 years          BP:           104/71 mmHg Patient Gender: F                 HR:           82 bpm. Exam Location:  [facility]  Procedure: 2D Echo, Cardiac Doppler, Color Doppler, 3D Echo and Strain Analysis  Indications:    Z85.3 Breast cancer   History:         Patient has prior history of Echocardiogram examinations, most                 recent 06/28/2017. Breast cancer, Signs/Symptoms:H/o chemoth;                 Risk Factors:Current Smoker, Dyslipidemia and Diabetes.   Sonographer:    Coralyn Helling RDCS Referring Phys: [id number] [place]   1. Left ventricular ejection fraction, by estimation, is 55 to 60%. The left ventricle has normal function. The left ventricle has no regional wall motion abnormalities. Left ventricular diastolic parameters are consistent with Grade I diastolic  dysfunction (impaired relaxation). The average left ventricular global longitudinal strain is -16.7 %. The global longitudinal strain is normal. GLS underestimated due to poor endocardial tracking.  2. Right ventricular systolic function is normal. The right ventricular size is normal.  3. Left atrial size was mild to moderately dilated.  4. The mitral valve is normal in structure. Trivial mitral valve regurgitation. No evidence of mitral stenosis.  5. The aortic valve is tricuspid. There is mild calcification of the aortic valve. Aortic valve regurgitation is not visualized. Aortic valve sclerosis/calcification is present, without any evidence of aortic stenosis.  6. The inferior vena cava is normal in size with greater than 50% respiratory variability, suggesting right atrial pressure of 3 mmHg.  FINDINGS  Left Ventricle: Left ventricular ejection fraction, by estimation, is 55 to 60%. The left ventricle has normal function. The left ventricle has no regional wall motion abnormalities. The average left ventricular global longitudinal strain is -16.7 %.  The global longitudinal strain is normal. The left ventricular internal cavity size was normal in size. There is no left ventricular hypertrophy. Left ventricular diastolic parameters are consistent with Grade I diastolic dysfunction (impaired  relaxation).  Right Ventricle: The right ventricular  size is normal. No increase in right ventricular wall thickness. Right ventricular systolic function is normal.  Left Atrium: Left atrial size was mild to moderately dilated.  Right Atrium: Right atrial  size was normal in size.  Pericardium: There is no evidence of pericardial effusion.  Mitral Valve: The mitral valve is normal in structure. Trivial mitral valve regurgitation. No evidence of mitral valve stenosis.  Tricuspid Valve: The tricuspid valve is normal in structure. Tricuspid valve regurgitation is trivial. No evidence of tricuspid stenosis.  Aortic Valve: The aortic valve is tricuspid. There is mild calcification of the aortic valve. Aortic valve regurgitation is not visualized. Aortic valve sclerosis/calcification is present, without any evidence of aortic stenosis.  Pulmonic Valve: The pulmonic valve was normal in structure. Pulmonic valve regurgitation is not visualized. No evidence of pulmonic stenosis.  Aorta: The aortic root is normal in size and structure.  Venous: The inferior vena cava is normal in size with greater than 50% respiratory variability, suggesting right atrial pressure of 3 mmHg.  IAS/Shunts: No atrial level shunt detected by color flow Doppler.    LEFT VENTRICLE PLAX 2D LVIDd:         4.80 cm   Diastology LVIDs:         3.40 cm   LV e' medial:    12.30 cm/s LV PW:         0.90 cm   LV E/e' medial:  5.8 LV IVS:        0.90 cm   LV e' lateral:   14.30 cm/s LVOT diam:     2.00 cm   LV E/e' lateral: 5.0 LV SV:         55 LV SV Index:   30        2D Longitudinal Strain LVOT Area:     3.14 cm  2D Strain GLS Avg:     -16.7 %                            3D Volume EF:                          3D EF:        58 %                          LV EDV:       126 ml                          LV ESV:       53 ml                          LV SV:        73 ml  RIGHT VENTRICLE             IVC RV S prime:     14.10 cm/s  IVC diam: 1.30 cm TAPSE (M-mode): 1.5 cm RVSP:            21.5 mmHg  LEFT ATRIUM             Index        RIGHT ATRIUM           Index LA diam:        4.20 cm 2.26 cm/m   RA Pressure: 3.00 mmHg LA Vol (A2C):   78.1 ml 42.05 ml/m  RA Area:     13.30 cm LA Vol (A4C):   60.1 ml 32.36 ml/m  RA Volume:  32.70 ml  17.61 ml/m LA Biplane Vol: 69.3 ml 37.31 ml/m  AORTIC VALVE LVOT Vmax:   81.40 cm/s LVOT Vmean:  54.900 cm/s LVOT VTI:    0.175 m   AORTA Ao Root diam: 3.30 cm Ao Asc diam:  3.50 cm  MITRAL VALVE               TRICUSPID VALVE MV Area (PHT): 2.28 cm    TR Peak grad:   18.5 mmHg MV Decel Time: 333 msec    TR Vmax:        215.00 cm/s MV E velocity: 71.30 cm/s  Estimated RAP:  3.00 mmHg MV A velocity: 61.30 cm/s  RVSP:           21.5 mmHg MV E/A ratio:  1.16                            SHUNTS                            Systemic VTI:  0.18 m                            Systemic Diam: 2.00 cm  Glori Bickers MD Electronically signed by Glori Bickers MD Signature Date/Time: 09/15/2021/5:10:06 PM      Final   Note: Reviewed        Physical Exam  General appearance: Well nourished, well developed, and well hydrated. In no apparent acute distress Mental status: Alert, oriented x 3 (person, place, & time)       Respiratory: No evidence of acute respiratory distress Eyes: PERLA Vitals: BP 108/81   Pulse 80   Temp (!) 97.3 F (36.3 C)   Ht '5\' 4"'$  (1.626 m)   Wt 195 lb (88.5 kg)   LMP  (LMP Unknown) Comment: LAST PERIOD IN MAY 2019 WHEN SHE STARTED CHEMO  SpO2 100%   BMI 33.47 kg/m  BMI: Estimated body mass index is 33.47 kg/m as calculated from the following:   Height as of this encounter: '5\' 4"'$  (1.626 m).   Weight as of this encounter: 195 lb (88.5 kg). Ideal: Ideal body weight: 54.7 kg (120 lb 9.5 oz) Adjusted ideal body weight: 68.2 kg (150 lb 5.7 oz)  Assessment   Diagnosis Status  1. Chronic pain syndrome   2. Chronic feet pain (1ry area of Pain) (Bilateral) (R>L)   3. Chronic knee pain (2ry area of  Pain) (Bilateral) (R>L)   4. Chronic hand pain (3ry area of Pain) (Bilateral) (R>L)   5. Cervicalgia   6. DDD (degenerative disc disease), lumbosacral   7. Cancer-related pain   8. Lumbar facet syndrome (Bilateral) (L>R)   9. Chronic low back pain (Bilateral (L>R) w/o sciatica   10. 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 Chronic hip pain (4th area of Pain) (Bilateral) (R>L)   12. 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 Pharmacologic therapy   14. Abnormal MRI, lumbar spine (04/20/2018)   15. Chronic use of opiate for therapeutic purpose   16. Encounter for medication management   17. Encounter for chronic pain management    Controlled Controlled Controlled   Updated Problems: No problems updated.  Plan of Care  Problem-specific:  No problem-specific Assessment & Plan notes found for this encounter.  Ms. NITZA SCHMID has a current medication list which includes the following long-term medication(s): amitriptyline, famotidine, basaglar kwikpen, lamotrigine, linagliptin, metformin, [START ON 03/01/2022] oxycodone, [START ON 03/31/2022] oxycodone, [START ON 04/30/2022] oxycodone, pregabalin, rosuvastatin, and sumatriptan.  Pharmacotherapy (Medications Ordered): Meds ordered this encounter  Medications   oxyCODONE (OXY IR/ROXICODONE) 5 MG immediate release tablet    Sig: Take 1 tablet (5 mg total) by mouth 2 (two) times daily as needed for severe pain. Must last 30 days    Dispense:  60 tablet    Refill:  0    DO NOT: delete (not duplicate); no partial-fill (will deny script to complete), no refill request (F/U required). DISPENSE: 1 day early if closed on fill date. WARN: No CNS-depressants within 8 hrs of med.   oxyCODONE (OXY IR/ROXICODONE) 5 MG immediate release tablet    Sig: Take 1 tablet (5 mg total) by mouth 2 (two) times daily as needed for severe pain. Must last 30 days    Dispense:  60 tablet     Refill:  0    DO NOT: delete (not duplicate); no partial-fill (will deny script to complete), no refill request (F/U required). DISPENSE: 1 day early if closed on fill date. WARN: No CNS-depressants within 8 hrs of med.   oxyCODONE (OXY IR/ROXICODONE) 5 MG immediate release tablet    Sig: Take 1 tablet (5 mg total) by mouth 2 (two) times daily as needed for severe pain. Must last 30 days    Dispense:  60 tablet    Refill:  0    DO NOT: delete (not duplicate); no partial-fill (will deny script to complete), no refill request (F/U required). DISPENSE: 1 day early if closed on fill date. WARN: No CNS-depressants within 8 hrs of med.   Orders:  No orders of the defined types were placed in this encounter.  Follow-up plan:   Return in about 14 weeks (around 05/30/2022) for Eval-day (M,W), (F2F), (MM).     Interventional Therapies  Risk  Complexity Considerations:   Estimated body mass index is 30.55 kg/m as calculated from the following:   Height as of this encounter: '5\' 4"'$  (1.626 m).   Weight as of this encounter: 178 lb (80.7 kg). WNL   Planned  Pending:      Under consideration:   Possible bilateral lumbar facet RFA  Diagnostic lumbar sympathetic Blk  Diagnostic right genicular NB  Possible right genicular nerve RFA    Completed:   Therapeutic right IA steroid knee inj. x1 (08/18/2020) (100/100/100 x 24 hours/80-85)  Diagnostic bilateral lumbar facet Blk x2 (08/16/18) (100/100/80/90-100)  Therapeutic/palliative right knee Hyalgan inj. x5 (06/25/2019) (100/100/75 x 1 week/70)    Therapeutic  Palliative (PRN) options:   Therapeutic right IA steroid knee inj.  Diagnostic bilateral lumbar facet MBB   Therapeutic/palliative right Hyalgan knee inj.     Pharmacotherapy  Nonopioids transfer 02/03/2020: Pregabalin (Lyrica) and alpha-lipoic acid      Recent Visits Date Type Provider Dept  11/24/21 Office Visit Milinda Pointer, MD [facility]  Showing recent visits  within past 90 days and meeting all other requirements Today's Visits Date Type Provider Dept  02/21/22 Office Visit Milinda Pointer, MD [facility]  Showing today's visits and meeting all other requirements Future Appointments No visits were found meeting these conditions. Showing future appointments within next 90 days and meeting all other requirements  I discussed the assessment and treatment plan with the patient. The patient was provided an opportunity to  ask questions and all were answered. The patient agreed with the plan and demonstrated an understanding of the instructions.  Patient advised to call back or seek an in-person evaluation if the symptoms or condition worsens.  Duration of encounter: 30 minutes.  Total time on encounter, as per AMA guidelines included both the face-to-face and non-face-to-face time personally spent by the physician and/or other qualified health care professional(s) on the day of the encounter (includes time in activities that require the physician or other qualified health care professional and does not include time in activities normally performed by clinical staff). Physician's time may include the following activities when performed: Preparing to see the patient (e.g., pre-charting review of records, searching for previously ordered imaging, lab work, and nerve conduction tests) Review of prior analgesic pharmacotherapies. Reviewing PMP Interpreting ordered tests (e.g., lab work, imaging, nerve conduction tests) Performing post-procedure evaluations, including interpretation of diagnostic procedures Obtaining and/or reviewing separately obtained history Performing a medically appropriate examination and/or evaluation Counseling and educating the patient/family/caregiver Ordering medications, tests, or procedures Referring and communicating with other health care professionals (when not separately reported) Documenting clinical  information in the electronic or other health record Independently interpreting results (not separately reported) and communicating results to the patient/ family/caregiver Care coordination (not separately reported)  Note by: Gaspar Cola, MD Date: 02/21/2022; Time: 3:11 PM

## 2022-02-22 ENCOUNTER — Encounter: Payer: Self-pay | Admitting: Internal Medicine

## 2022-02-25 ENCOUNTER — Encounter: Payer: Self-pay | Admitting: Internal Medicine

## 2022-03-01 ENCOUNTER — Encounter: Payer: Self-pay | Admitting: Internal Medicine

## 2022-03-01 ENCOUNTER — Inpatient Hospital Stay (HOSPITAL_BASED_OUTPATIENT_CLINIC_OR_DEPARTMENT_OTHER): Payer: BLUE CROSS/BLUE SHIELD | Admitting: Internal Medicine

## 2022-03-01 ENCOUNTER — Inpatient Hospital Stay: Payer: BLUE CROSS/BLUE SHIELD

## 2022-03-01 DIAGNOSIS — Z170 Estrogen receptor positive status [ER+]: Secondary | ICD-10-CM

## 2022-03-01 DIAGNOSIS — C50512 Malignant neoplasm of lower-outer quadrant of left female breast: Secondary | ICD-10-CM

## 2022-03-01 DIAGNOSIS — Z95828 Presence of other vascular implants and grafts: Secondary | ICD-10-CM

## 2022-03-01 DIAGNOSIS — Z5111 Encounter for antineoplastic chemotherapy: Secondary | ICD-10-CM | POA: Diagnosis not present

## 2022-03-01 LAB — IRON AND TIBC
Iron: 65 ug/dL (ref 28–170)
Saturation Ratios: 20 % (ref 10.4–31.8)
TIBC: 319 ug/dL (ref 250–450)
UIBC: 254 ug/dL

## 2022-03-01 LAB — COMPREHENSIVE METABOLIC PANEL
ALT: 14 U/L (ref 0–44)
AST: 21 U/L (ref 15–41)
Albumin: 3.8 g/dL (ref 3.5–5.0)
Alkaline Phosphatase: 77 U/L (ref 38–126)
Anion gap: 11 (ref 5–15)
BUN: 16 mg/dL (ref 6–20)
CO2: 25 mmol/L (ref 22–32)
Calcium: 9.1 mg/dL (ref 8.9–10.3)
Chloride: 100 mmol/L (ref 98–111)
Creatinine, Ser: 1.22 mg/dL — ABNORMAL HIGH (ref 0.44–1.00)
GFR, Estimated: 53 mL/min — ABNORMAL LOW (ref >60)
Glucose, Bld: 330 mg/dL — ABNORMAL HIGH (ref 70–99)
Potassium: 3.6 mmol/L (ref 3.5–5.1)
Sodium: 136 mmol/L (ref 135–145)
Total Bilirubin: 0.2 mg/dL — ABNORMAL LOW (ref 0.3–1.2)
Total Protein: 6.8 g/dL (ref 6.5–8.1)

## 2022-03-01 LAB — CBC WITH DIFFERENTIAL/PLATELET
Abs Immature Granulocytes: 0.03 10*3/uL (ref 0.00–0.07)
Basophils Absolute: 0 10*3/uL (ref 0.0–0.1)
Basophils Relative: 1 %
Eosinophils Absolute: 0.1 10*3/uL (ref 0.0–0.5)
Eosinophils Relative: 1 %
HCT: 36.6 % (ref 36.0–46.0)
Hemoglobin: 12.3 g/dL (ref 12.0–15.0)
Immature Granulocytes: 0 %
Lymphocytes Relative: 26 %
Lymphs Abs: 1.9 10*3/uL (ref 0.7–4.0)
MCH: 31.6 pg (ref 26.0–34.0)
MCHC: 33.6 g/dL (ref 30.0–36.0)
MCV: 94.1 fL (ref 80.0–100.0)
Monocytes Absolute: 0.4 10*3/uL (ref 0.1–1.0)
Monocytes Relative: 6 %
Neutro Abs: 4.7 10*3/uL (ref 1.7–7.7)
Neutrophils Relative %: 66 %
Platelets: 175 10*3/uL (ref 150–400)
RBC: 3.89 MIL/uL (ref 3.87–5.11)
RDW: 12.8 % (ref 11.5–15.5)
WBC: 7.1 10*3/uL (ref 4.0–10.5)
nRBC: 0 % (ref 0.0–0.2)

## 2022-03-01 LAB — FERRITIN: Ferritin: 149 ng/mL (ref 11–307)

## 2022-03-01 MED ORDER — SODIUM CHLORIDE 0.9 % IV SOLN
Freq: Once | INTRAVENOUS | Status: AC
  Filled 2022-03-01: qty 250

## 2022-03-01 MED ORDER — GOSERELIN ACETATE 10.8 MG ~~LOC~~ IMPL
10.8000 mg | DRUG_IMPLANT | Freq: Once | SUBCUTANEOUS | Status: AC
  Administered 2022-03-01: 10.8 mg via SUBCUTANEOUS
  Filled 2022-03-01: qty 10.8

## 2022-03-01 MED ORDER — ZOLEDRONIC ACID 4 MG/100ML IV SOLN
4.0000 mg | Freq: Once | INTRAVENOUS | Status: AC
  Administered 2022-03-01: 4 mg via INTRAVENOUS
  Filled 2022-03-01: qty 100

## 2022-03-01 MED ORDER — HEPARIN SOD (PORK) LOCK FLUSH 100 UNIT/ML IV SOLN
500.0000 [IU] | Freq: Once | INTRAVENOUS | Status: AC
  Administered 2022-03-01: 500 [IU] via INTRAVENOUS
  Filled 2022-03-01: qty 5

## 2022-03-01 MED ORDER — ZOLEDRONIC ACID 4 MG/5ML IV CONC: 4.0000 mg | Freq: Once | INTRAVENOUS | Status: DC

## 2022-03-01 MED ORDER — SODIUM CHLORIDE 0.9% FLUSH
10.0000 mL | Freq: Once | INTRAVENOUS | Status: AC
  Administered 2022-03-01: 10 mL via INTRAVENOUS
  Filled 2022-03-01: qty 10

## 2022-03-01 NOTE — Progress Notes (Signed)
[facility] CONSULT NOTE  Patient Care Team: [name], NP as PCP - General (Nurse Practitioner) Freada Bergeron, MD as PCP - Cardiology (Cardiology) [name], [name], MD (General Surgery) [name], RN as Registered Nurse Theora Gianotti, Venida Jarvis, [name] (Obstetrics and Gynecology) Cammie Sickle, MD as Consulting Physician (Oncology) Lovell Sheehan, MD as Consulting Physician (Orthopedic Surgery)  CHIEF COMPLAINTS/PURPOSE OF CONSULTATION: Breast cancer  Oncology History Overview Note  # MAY 2019-  clinical stage IIIA (T3N1Mx) left breast cancer s/p biopsy on 06/14/2017. -Pathology revealed grade III invasive ductal carcinoma. -Axillary FNA revealed malignant cells c/w metastatic carcinoma. Tumor was ER + (90%), PR + (30%), Her2/neu - and Ki67 70%.  CA27.29 was 7.8 on 06/14/2017.  # She received 4 cycles of AC with Neulasta support (07/20/2017 - 08/31/2017).;  neoadjuvant Taxol on 09/14/2017.  #DEC 2019- Lumpectomy/sentinel lymph node biopsy [Dr.[name]]-complete pathologic response  # s/p RT [delayed sec to wound infection; Dr.[name]] finished RT [4/12]  # April 14th 2020- START TAM; stopped in mid-May secondary intolerance [severe migraines].  # 18th May 2020-start Arimidex [hormonal profile-postmenopausal;add Zoladex q3M]; STOPPED sec intolerance/joint pain; NOV 2021- STARTED AROMASIN; Stopped x 2 months sec to extreme fatigue/severe joint pains.   # July 2022- START tamoxifen 10 mg a day.  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 Secord/Berchuck]-negative for malignancy/ # DM-2- poorly controlled.   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   # PAP SMEAR- RECOMMENDED 2022- summer  -------------------------------------------  DIAGNOSIS: left breast cancer  STAGE:  III       ;GOALS: cure  CURRENT/MOST RECENT THERAPY Tam     Cancer of midline of breast, left (HCC) (Resolved)  06/15/2017 Initial Diagnosis   Cancer of midline of breast, left ([place])   07/20/2017 - 11/16/2017 Chemotherapy   The patient had dexamethasone (DECADRON) 4 MG tablet, 1 of 1 cycle, Start date: --, End date: -- DOXOrubicin (ADRIAMYCIN) chemo injection 122 mg, 60 mg/m2 = 122 mg, Intravenous,  Once, 4 of 4 cycles Administration: 122 mg (07/20/2017), 122 mg (08/03/2017), 122 mg (08/17/2017), 122 mg (08/31/2017) palonosetron (ALOXI) injection 0.25 mg, 0.25 mg, Intravenous,  Once, 4 of 4 cycles Administration: 0.25 mg (07/20/2017), 0.25 mg (08/03/2017), 0.25 mg (08/17/2017), 0.25 mg (08/31/2017) pegfilgrastim (NEULASTA) injection 6 mg, 6 mg, Subcutaneous, Once, 5 of 5 cycles Administration: 6 mg (07/21/2017), 6 mg (08/04/2017), 6 mg (08/18/2017), 6 mg (09/01/2017) cyclophosphamide (CYTOXAN) 1,220 mg in sodium chloride 0.9 % 250 mL chemo infusion, 600 mg/m2 = 1,220 mg, Intravenous,  Once, 4 of 4 cycles Administration: 1,220 mg (07/20/2017), 1,220 mg (08/03/2017), 1,220 mg (08/17/2017), 1,220 mg (08/31/2017) PACLitaxel (TAXOL) 162 mg in sodium chloride 0.9 % 250 mL chemo infusion (</= '80mg'$ /m2), 80 mg/m2 = 162 mg, Intravenous,  Once, 10 of 12 cycles Dose modification: 65 mg/m2 (original dose 80 mg/m2, Cycle 13, Reason: Provider Judgment, Comment: neuropathy) Administration: 162 mg (09/14/2017), 162 mg (09/21/2017), 162 mg (09/28/2017), 162 mg (10/05/2017), 162 mg (10/12/2017), 162 mg (10/19/2017), 162 mg (10/26/2017), 132 mg (11/02/2017), 132 mg (11/09/2017), 132 mg (11/16/2017) fosaprepitant (EMEND) 150 mg, dexamethasone (DECADRON) 12 mg in sodium chloride 0.9 % 145 mL IVPB, , Intravenous,  Once, 4 of 4 cycles Administration:  (07/20/2017),  (08/03/2017),  (08/17/2017),  (08/31/2017)  for chemotherapy treatment.    Malignant neoplasm of lower-outer quadrant of left breast of female, estrogen receptor positive ([place])  11/08/2017 Initial Diagnosis  Malignant neoplasm of lower-outer  quadrant of left breast of female, estrogen receptor positive ([place])    HISTORY OF PRESENTING ILLNESS: Ambulating independently.  Alone.  Carolyn Roy 57 y.o.  female with a history of stage III ER PR positive HER-2/neu negative breast cancer currently on tamoxifen 10 mg a day [intolerance] plus Zoladexq3 /Zometa every 6 months is here for follow-up.  In the interim patient was evaluated by neurology for ongoing headaches/neuropathy.  S/p evaluation with neurology.  Patient denies any new lumps or bumps.  Appetite is good.  No weight loss. Patient has been started on amitriptyline; by neurology.  She continues to be on Cymbalta.    Review of Systems  Constitutional:  Positive for malaise/fatigue. Negative for chills, diaphoresis, fever and weight loss.  HENT:  Negative for nosebleeds and sore throat.   Eyes:  Negative for double vision.  Respiratory:  Negative for cough, hemoptysis, sputum production, shortness of breath and wheezing.   Cardiovascular:  Negative for chest pain, palpitations, orthopnea and leg swelling.  Gastrointestinal:  Negative for abdominal pain, blood in stool, constipation, diarrhea, heartburn, melena, nausea and vomiting.  Genitourinary:  Negative for dysuria, frequency and urgency.  Musculoskeletal:  Positive for back pain and joint pain.  Skin: Negative.  Negative for itching and rash.  Neurological:  Positive for tingling. Negative for dizziness, focal weakness, weakness and headaches.  Endo/Heme/Allergies:  Does not bruise/bleed easily.  Psychiatric/Behavioral:  Negative for depression. The patient is not nervous/anxious and does not have insomnia.      MEDICAL HISTORY:  Past Medical History:  Diagnosis Date   Allergy    Breast cancer ([place])    Cancer ([place]) 06/15/2017   5.1 cm, T3,N1 (clinical): ER/ PR positive, Her 2 neu not overexpressed, High Ki 67. Neuoadjuvant chemotherapy.    Depression    Diabetes mellitus without complication ([place]) 2585    Edema of left upper extremity    Endometriosis    Family history of breast cancer    Headache    migraines   Hyperlipidemia    Lymphedema of left arm    Ovarian mass    Personal history of chemotherapy    Personal history of radiation therapy    Pneumonia    2018    SURGICAL HISTORY: Past Surgical History:  Procedure Laterality Date   AXILLARY LYMPH NODE BIOPSY Left 07/14/2017   Procedure: INSERTION GEL MARK CLIP LEFT AXILLA;  Surgeon: Robert Bellow, MD;  Location: [facility] ORS;  Service: General;  Laterality: Left;   BREAST BIOPSY Left    Dr Orlene Och BREAST METASTATIC CARCINOMA   BREAST LUMPECTOMY Left 01/12/2018   COLONOSCOPY WITH PROPOFOL N/A 12/27/2019   Procedure: COLONOSCOPY WITH PROPOFOL;  Surgeon: Virgel Manifold, MD;  Location: [facility] ENDOSCOPY;  Service: Endoscopy;  Laterality: N/A;   COLONOSCOPY WITH PROPOFOL N/A 01/05/2022   Procedure: COLONOSCOPY WITH PROPOFOL;  Surgeon: Lin Landsman, MD;  Location: [facility] ENDOSCOPY;  Service: Gastroenterology;  Laterality: N/A;   OOPHORECTOMY     PARTIAL MASTECTOMY WITH NEEDLE LOCALIZATION Left 01/12/2018   Procedure: PARTIAL MASTECTOMY WITH NEEDLE LOCALIZATION;  Surgeon: Robert Bellow, MD;  Location: [facility] ORS;  Service: General;  Laterality: Left;   PORTACATH PLACEMENT Right 07/14/2017   Procedure: INSERTION PORT-A-CATH;  Surgeon: Robert Bellow, MD;  Location: [facility] ORS;  Service: General;  Laterality: Right;   SENTINEL NODE BIOPSY Left 01/12/2018   Procedure: SENTINEL NODE BIOPSY;  Surgeon: Robert Bellow, MD;  Location: [facility] ORS;  Service: General;  Laterality: Left;   TUBAL LIGATION      SOCIAL HISTORY: Social History   Socioeconomic History   Marital status: Married    Spouse name: Not on file   Number of children: Not on file   Years of education: Not on file   Highest education level: Not on file  Occupational History   Not on file  Tobacco Use   Smoking status: Every Day    Packs/day: 1.00     Years: 11.00    Total pack years: 11.00    Types: Cigarettes   Smokeless tobacco: Former    Types: Snuff  Vaping Use   Vaping Use: Never used  Substance and Sexual Activity   Alcohol use: No    Alcohol/week: 0.0 standard drinks of alcohol   Drug use: No   Sexual activity: Yes  Other Topics Concern   Not on file  Social History Narrative   ** Merged History Encounter **       Social Determinants of Health   Financial Resource Strain: Low Risk  (07/03/2019)   Overall Financial Resource Strain (CARDIA)    Difficulty of Paying Living Expenses: Not very hard  Food Insecurity: Not on file  Transportation Needs: Not on file  Physical Activity: Not on file  Stress: Not on file  Social Connections: Not on file  Intimate Partner Violence: Not on file    FAMILY HISTORY: Family History  Problem Relation Age of Onset   Colon cancer Mother    Cancer Mother    Other Father        family hx on dad's side: breast, colon, stomach cancer-biological dad   Diabetes Brother    Pancreatitis Brother    Prostate cancer Brother 78       currently 65 / maternal half-brother   Breast cancer Maternal Aunt 67       currently 82   Breast cancer Maternal Grandmother 44       deceased 10s   Colon cancer Maternal Grandmother    Breast cancer Other 9       mother's sister; deceased 72   Breast cancer Other        mother's sister; age at dx unknown    ALLERGIES:  is allergic to aspirin.  MEDICATIONS:  Current Outpatient Medications  Medication Sig Dispense Refill   amitriptyline (ELAVIL) 50 MG tablet TAKE 1 TABLET BY MOUTH EVERYDAY AT BEDTIME (Patient taking differently: Take 50 mg by mouth at bedtime as needed.) 90 tablet 2   aspirin-acetaminophen-caffeine (EXCEDRIN MIGRAINE) 250-250-65 MG tablet Take 2 tablets by mouth daily as needed for headache.      Blood Glucose Monitoring Suppl (ONETOUCH VERIO) w/Device KIT Use to check blood sugar 3 times a day and document results, bring to  appointments.  Goal is <130 fasting blood sugar and <180 two hours after meals. 1 kit 0   DULoxetine (CYMBALTA) 60 MG capsule Take 60 mg by mouth daily.     famotidine (PEPCID) 20 MG tablet TAKE 1 TABLET BY MOUTH EVERY DAY 90 tablet 3   glucose blood test strip Use to check blood sugar 3 times daily, fasting in morning with goal <130 and 2 hours after meals with goal <180.  Bring blood sugar log to visits. 100 each 12   goserelin (ZOLADEX) 3.6 MG injection Inject 3.6 mg into the skin every 28 (twenty-eight) days.     Insulin Glargine (BASAGLAR KWIKPEN) 100 UNIT/ML Inject 10 Units into the skin daily. 30 mL 2  Insulin Pen Needle (NOVOFINE) 30G X 8 MM MISC Inject 10 each into the skin as needed. 100 each 4   lamoTRIgine (LAMICTAL) 25 MG tablet Take 1 tablet (25 mg total) by mouth 2 (two) times daily. 60 tablet 4   linagliptin (TRADJENTA) 5 MG TABS tablet Take 1 tablet (5 mg total) by mouth daily. 90 tablet 4   metFORMIN (GLUCOPHAGE-XR) 500 MG 24 hr tablet Start by taking one tablet (500 MG) twice a day with meals and then increase in one week to two tablets (1000 MG) twice a day with meals. 360 tablet 4   naloxone (NARCAN) nasal spray 4 mg/0.1 mL Place 1 spray into the nose as needed for up to 365 doses (for opioid-induced respiratory depresssion). In case of emergency (overdose), spray once into each nostril. If no response within 3 minutes, repeat application and call [phone]. 1 each 0   oxyCODONE (OXY IR/ROXICODONE) 5 MG immediate release tablet Take 1 tablet (5 mg total) by mouth 2 (two) times daily as needed for severe pain. Must last 30 days 60 tablet 0   [START ON 03/31/2022] oxyCODONE (OXY IR/ROXICODONE) 5 MG immediate release tablet Take 1 tablet (5 mg total) by mouth 2 (two) times daily as needed for severe pain. Must last 30 days 60 tablet 0   [START ON 04/30/2022] oxyCODONE (OXY IR/ROXICODONE) 5 MG immediate release tablet Take 1 tablet (5 mg total) by mouth 2 (two) times daily as needed for severe  pain. Must last 30 days 60 tablet 0   pregabalin (LYRICA) 150 MG capsule TAKE 1 CAPSULE BY MOUTH TWICE A DAY 60 capsule 2   rosuvastatin (CRESTOR) 40 MG tablet Take 1 tablet (40 mg total) by mouth daily. Stop taking Atorvastatin. 90 tablet 4   SUMAtriptan (IMITREX) 100 MG tablet Take 1 tablet (100 mg total) by mouth every 2 (two) hours as needed for migraine. May repeat in 2 hours if headache persists or recurs. 10 tablet 0   tamoxifen (NOLVADEX) 10 MG tablet TAKE 1 TABLET BY MOUTH EVERY DAY 90 tablet 0   Vitamin D, Ergocalciferol, (DRISDOL) 1.25 MG (50000 UNIT) CAPS capsule TAKE 1 CAPSULE BY MOUTH ONE TIME PER WEEK 12 capsule 1   No current facility-administered medications for this visit.   Facility-Administered Medications Ordered in Other Visits  Medication Dose Route Frequency Provider Last Rate Last Admin   heparin lock flush 100 unit/mL  500 Units Intravenous Once Corcoran, Melissa C, MD       sodium chloride flush (NS) 0.9 % injection 10 mL  10 mL Intravenous Once Lequita Asal, MD          .  PHYSICAL EXAMINATION: ECOG PERFORMANCE STATUS: 0 - Asymptomatic  Vitals:   03/01/22 1400  BP: 108/76  Pulse: 78  Resp: 18  Temp: (!) 96.6 F (35.9 C)   Filed Weights   03/01/22 1400  Weight: 191 lb (86.6 kg)    Physical Exam HENT:     Head: Normocephalic and atraumatic.     Mouth/Throat:     Pharynx: No oropharyngeal exudate.  Eyes:     Pupils: Pupils are equal, round, and reactive to light.  Cardiovascular:     Rate and Rhythm: Normal rate and regular rhythm.  Pulmonary:     Effort: No respiratory distress.     Breath sounds: No wheezing.  Abdominal:     General: Bowel sounds are normal. There is no distension.     Palpations: Abdomen is soft. There is no  mass.     Tenderness: There is no abdominal tenderness. There is no guarding or rebound.  Musculoskeletal:        General: No tenderness. Normal range of motion.     Cervical back: Normal range of motion and  neck supple.  Skin:    General: Skin is warm.     Comments: Right and left BREAST exam [in the presence of nurse]- no unusual skin changes or dominant masses felt. Surgical scars noted.    Neurological:     Mental Status: She is alert and oriented to person, place, and time.  Psychiatric:        Mood and Affect: Affect normal.      LABORATORY DATA:  I have reviewed the data as listed Lab Results  Component Value Date   WBC 7.1 03/01/2022   HGB 12.3 03/01/2022   HCT 36.6 03/01/2022   MCV 94.1 03/01/2022   PLT 175 03/01/2022   Recent Labs    08/27/21 1302 09/01/21 1349 11/29/21 1252 12/03/21 1435 03/01/22 1340  NA 140   < > 138 141 136  K 3.1*   < > 3.7 3.9 3.6  CL 106   < > 105 106 100  CO2 26   < > '26 24 25  '$ GLUCOSE 152*   < > 123* 111* 330*  BUN 16   < > '14 10 16  '$ CREATININE 0.82   < > 1.00 1.02* 1.22*  CALCIUM 8.5*   < > 9.0 9.3 9.1  GFRNONAA >60  --  >60  --  53*  PROT 6.3*  --  6.6 6.2 6.8  ALBUMIN 3.5  --  3.6 4.0 3.8  AST 21  --  '20 13 21  '$ ALT 14  --  '12 10 14  '$ ALKPHOS 52  --  49 59 77  BILITOT 0.5  --  0.4 0.3 0.2*   < > = values in this interval not displayed.    RADIOGRAPHIC STUDIES: I have personally reviewed the radiological images as listed and agreed with the findings in the report. No results found.  ASSESSMENT & PLAN:   Malignant neoplasm of lower-outer quadrant of left breast of female, estrogen receptor positive ([place]) # Breast cancer Stage III ER/PR pos her 2 NEG. currently on Tamoxifen+ Zoldadex.  No clinical evidence of recurrence; STABLE. adjuvant Zometa q6M [June 2022 x3 years]; Zoladex today q3M.  Mammogram August 2023 [Dr.Piscoya]- WNL.   # Currently on Tamoxifen 10 mg [lower dose; previous intolerance to 20 mg/AI- ]. stable  # Mild Anemia- Hb 11.8;recommend PO gentle iron. Hx of colonoscopy- 2022 ? awaiting evaluation with GI- [nov 15th] - Dr.Vanga. OCT 2023- 133; sat-19%.   # Depression/ Insomnia--stable On cymblata/Amiytrplitine  qhs /Dr.vaslow[DI-none as per uptodate*] Dr.kapur]  # Joint pains-multifactorial- Improved off Lyrica [As per pt ]; stable.   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   # Memory loss/deficits- s/p evaluation with Dr.Vaslow in Jan 2024-attributed to sleep hygiene/possible OSA.  # Lymphedema-left chest walls/p physical therapy- stable.   # DM-2 on insulin- STABLE [A1c-6.8]Blood glucose 293 [PP]-;   Defer to PCP  # Port malfunction: flush; s/p TPA-STABLE.   Zometa q62m zola 3 m  # DISPOSITION: # add iron studies;ferritin to labs today # Zoladex;  # Follow up in 3 month;  MD;cbc/cmp/iron studies; ferritin- ca-27-29; Zoladex; Zometa- port flush. Dr.B  All questions were answered. The patient knows to call the clinic with any problems, questions or concerns.  Cammie Sickle, MD 03/01/2022 2:35 PM

## 2022-03-01 NOTE — Patient Instructions (Signed)
[facility]  Discharge Instructions: Thank you for choosing [facility] to provide your oncology and hematology care.  If you have a lab appointment with the [facility], please go directly to the [facility] and check in at the registration area.  Wear comfortable clothing and clothing appropriate for easy access to any Portacath or PICC line.   We strive to give you quality time with your provider. You may need to reschedule your appointment if you arrive late (15 or more minutes).  Arriving late affects you and other patients whose appointments are after yours.  Also, if you miss three or more appointments without notifying the office, you may be dismissed from the clinic at the provider's discretion.      For prescription refill requests, have your pharmacy contact our office and allow 72 hours for refills to be completed.    Today you received the following chemotherapy and/or immunotherapy agents Zoladex & Zometa      To help prevent nausea and vomiting after your treatment, we encourage you to take your nausea medication as directed.  BELOW ARE SYMPTOMS THAT SHOULD BE REPORTED IMMEDIATELY: *FEVER GREATER THAN 100.4 F (38 C) OR HIGHER *CHILLS OR SWEATING *NAUSEA AND VOMITING THAT IS NOT CONTROLLED WITH YOUR NAUSEA MEDICATION *UNUSUAL SHORTNESS OF BREATH *UNUSUAL BRUISING OR BLEEDING *URINARY PROBLEMS (pain or burning when urinating, or frequent urination) *BOWEL PROBLEMS (unusual diarrhea, constipation, pain near the anus) TENDERNESS IN MOUTH AND THROAT WITH OR WITHOUT PRESENCE OF ULCERS (sore throat, sores in mouth, or a toothache) UNUSUAL RASH, SWELLING OR PAIN  UNUSUAL VAGINAL DISCHARGE OR ITCHING   Items with * indicate a potential emergency and should be followed up as soon as possible or go to the Emergency Department if any problems should occur.  Please show the CHEMOTHERAPY ALERT CARD or IMMUNOTHERAPY ALERT CARD at  check-in to the Emergency Department and triage nurse.  Should you have questions after your visit or need to cancel or reschedule your appointment, please contact [facility]  [phone] and follow the prompts.  Office hours are 8:00 a.m. to 4:30 p.m. Monday - Friday. Please note that voicemails left after 4:00 p.m. may not be returned until the following business day.  We are closed weekends and major holidays. You have access to a nurse at all times for urgent questions. Please call the main number to the clinic [phone] and follow the prompts.  For any non-urgent questions, you may also contact your provider using MyChart. We now offer e-Visits for anyone 78 and older to request care online for non-urgent symptoms. For details visit [email or web address].   Also download the MyChart app! Go to the app store, search "MyChart", open the app, select [facility], and log in with your MyChart username and password.

## 2022-03-01 NOTE — Assessment & Plan Note (Addendum)
#  Breast cancer Stage III ER/PR pos her 2 NEG. currently on Tamoxifen+ Zoldadex.  No clinical evidence of recurrence; STABLE. adjuvant Zometa q6M [June 2022 x3 years]; Zoladex today q3M.  Mammogram August 2023 [Dr.Piscoya]- WNL.   # Currently on Tamoxifen 10 mg [lower dose; previous intolerance to 20 mg/AI- ]. stable  # Mild Anemia- Hb 11.8;recommend PO gentle iron. Hx of colonoscopy- 2022 ? S/p colonoscopy [nov 2023;  Dr.Vanga. OCT 2023- 133; sat-19%.; continue linzess.   # Depression/ Insomnia--stable On cymblata/Amiytrplitine qhs /Dr.vaslow[DI-none as per uptodate*] Dr.kapur]- stable.   # Joint pains-multifactorial- Improved off Lyrica [As per pt ]; stable.   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   # Memory loss/deficits- s/p evaluation with Dr.Vaslow in Jan 2024-attributed to sleep hygiene/possible OSA.   # Lymphedema-left chest walls/p physical therapy- stable.   # DM-2 on insulin- STABLE [A1c-6.8]Blood glucose 330 [PP]-;   Defer to PCP  # Port malfunction: flush; s/p TPA-stable.   Zometa q51m zola 3 m  # DISPOSITION: # Zoladex; zometa today # Follow up in 3 month;  MD;cbc/cmp; ca-27-29; Zoladex; port flush. Dr.B

## 2022-03-01 NOTE — Progress Notes (Signed)
Patient denies new problems/concerns today.  Stable neuropathy that is worse at night.

## 2022-03-02 LAB — CANCER ANTIGEN 27.29: CA 27.29: <9 U/mL (ref 0.0–38.6)

## 2022-03-05 DIAGNOSIS — E119 Type 2 diabetes mellitus without complications: Secondary | ICD-10-CM | POA: Insufficient documentation

## 2022-03-05 NOTE — Patient Instructions (Incomplete)
Questions to ask insurance: 1 -- Are Jardiance or Wilder Glade covered and if so what is the out of pocket cost for these monthly or every 3 months? 2 -- What can I take in place of Basaglar that is covered? Cost out of pocket? 3 -- What can I take in place of Trajdenta that is covered? Cost out of pocket?  Diabetes Mellitus Basics  Diabetes mellitus, or diabetes, is a long-term (chronic) disease. It occurs when the body does not properly use sugar (glucose) that is released from food after you eat. Diabetes mellitus may be caused by one or both of these problems: Your pancreas does not make enough of a hormone called insulin. Your body does not react in a normal way to the insulin that it makes. Insulin lets glucose enter cells in your body. This gives you energy. If you have diabetes, glucose cannot get into cells. This causes high blood glucose (hyperglycemia). How to treat and manage diabetes You may need to take insulin or other diabetes medicines daily to keep your glucose in balance. If you are prescribed insulin, you will learn how to give yourself insulin by injection. You may need to adjust the amount of insulin you take based on the foods that you eat. You will need to check your blood glucose levels using a glucose monitor as told by your health care provider. The readings can help determine if you have low or high blood glucose. Generally, you should have these blood glucose levels: Before meals (preprandial): 80-130 mg/dL (4.4-7.2 mmol/L). After meals (postprandial): below 180 mg/dL (10 mmol/L). Hemoglobin A1c (HbA1c) level: less than 7%. Your health care provider will set treatment goals for you. Keep all follow-up visits. This is important. Follow these instructions at home: Diabetes medicines Take your diabetes medicines every day as told by your health care provider. List your diabetes medicines here: Name of medicine: ______________________________ Amount (dose):  _______________ Time (a.m./p.m.): _______________ Notes: ___________________________________ Name of medicine: ______________________________ Amount (dose): _______________ Time (a.m./p.m.): _______________ Notes: ___________________________________ Name of medicine: ______________________________ Amount (dose): _______________ Time (a.m./p.m.): _______________ Notes: ___________________________________ Insulin If you use insulin, list the types of insulin you use here: Insulin type: ______________________________ Amount (dose): _______________ Time (a.m./p.m.): _______________Notes: ___________________________________ Insulin type: ______________________________ Amount (dose): _______________ Time (a.m./p.m.): _______________ Notes: ___________________________________ Insulin type: ______________________________ Amount (dose): _______________ Time (a.m./p.m.): _______________ Notes: ___________________________________ Insulin type: ______________________________ Amount (dose): _______________ Time (a.m./p.m.): _______________ Notes: ___________________________________ Insulin type: ______________________________ Amount (dose): _______________ Time (a.m./p.m.): _______________ Notes: ___________________________________ Managing blood glucose  Check your blood glucose levels using a glucose monitor as told by your health care provider. Write down the times that you check your glucose levels here: Time: _______________ Notes: ___________________________________ Time: _______________ Notes: ___________________________________ Time: _______________ Notes: ___________________________________ Time: _______________ Notes: ___________________________________ Time: _______________ Notes: ___________________________________ Time: _______________ Notes: ___________________________________  Low blood glucose Low blood glucose (hypoglycemia) is when glucose is at or below 70 mg/dL (3.9 mmol/L).  Symptoms may include: Feeling: Hungry. Sweaty and clammy. Irritable or easily upset. Dizzy. Sleepy. Having: A fast heartbeat. A headache. A change in your vision. Numbness around the mouth, lips, or tongue. Having trouble with: Moving (coordination). Sleeping. Treating low blood glucose To treat low blood glucose, eat or drink something containing sugar right away. If you can think clearly and swallow safely, follow the 15:15 rule: Take 15 grams of a fast-acting carb (carbohydrate), as told by your health care provider. Some fast-acting carbs are: Glucose tablets: take 3-4 tablets. Hard candy: eat 3-5 pieces. Fruit juice: drink 4  oz (120 mL). Regular (not diet) soda: drink 4-6 oz (120-180 mL). Honey or sugar: eat 1 Tbsp (15 mL). Check your blood glucose levels 15 minutes after you take the carb. If your glucose is still at or below 70 mg/dL (3.9 mmol/L), take 15 grams of a carb again. If your glucose does not go above 70 mg/dL (3.9 mmol/L) after 3 tries, get help right away. After your glucose goes back to normal, eat a meal or a snack within 1 hour. Treating very low blood glucose If your glucose is at or below 54 mg/dL (3 mmol/L), you have very low blood glucose (severe hypoglycemia). This is an emergency. Do not wait to see if the symptoms will go away. Get medical help right away. Call your local emergency services (911 in the [place] Do not drive yourself to the hospital. Questions to ask your health care provider Should I talk with a diabetes educator? What equipment will I need to care for myself at home? What diabetes medicines do I need? When should I take them? How often do I need to check my blood glucose levels? What number can I call if I have questions? When is my follow-up visit? Where can I find a support group for people with diabetes? Where to find more information American Diabetes Association: www.diabetes.org Association of Diabetes Care and Education  Specialists: www.diabeteseducator.org Contact a health care provider if: Your blood glucose is at or above 240 mg/dL (13.3 mmol/L) for 2 days in a row. You have been sick or have had a fever for 2 days or more, and you are not getting better. You have any of these problems for more than 6 hours: You cannot eat or drink. You feel nauseous. You vomit. You have diarrhea. Get help right away if: Your blood glucose is lower than 54 mg/dL (3 mmol/L). You get confused. You have trouble thinking clearly. You have trouble breathing. These symptoms may represent a serious problem that is an emergency. Do not wait to see if the symptoms will go away. Get medical help right away. Call your local emergency services (911 in the [place] Do not drive yourself to the hospital. Summary Diabetes mellitus is a chronic disease that occurs when the body does not properly use sugar (glucose) that is released from food after you eat. Take insulin and diabetes medicines as told. Check your blood glucose every day, as often as told. Keep all follow-up visits. This is important. This information is not intended to replace advice given to you by your health care provider. Make sure you discuss any questions you have with your health care provider. Document Revised: 05/28/2019 Document Reviewed: 05/28/2019 Elsevier Patient Education  [place].

## 2022-03-07 ENCOUNTER — Encounter: Payer: Self-pay | Admitting: Nurse Practitioner

## 2022-03-07 ENCOUNTER — Ambulatory Visit (INDEPENDENT_AMBULATORY_CARE_PROVIDER_SITE_OTHER): Payer: BLUE CROSS/BLUE SHIELD | Admitting: Nurse Practitioner

## 2022-03-07 VITALS — BP 100/67 | HR 76 | Temp 98.0°F | Ht 64.02 in | Wt 193.5 lb

## 2022-03-07 DIAGNOSIS — I951 Orthostatic hypotension: Secondary | ICD-10-CM

## 2022-03-07 DIAGNOSIS — E1165 Type 2 diabetes mellitus with hyperglycemia: Secondary | ICD-10-CM

## 2022-03-07 DIAGNOSIS — G43009 Migraine without aura, not intractable, without status migrainosus: Secondary | ICD-10-CM

## 2022-03-07 DIAGNOSIS — G894 Chronic pain syndrome: Secondary | ICD-10-CM

## 2022-03-07 DIAGNOSIS — C50512 Malignant neoplasm of lower-outer quadrant of left female breast: Secondary | ICD-10-CM | POA: Diagnosis not present

## 2022-03-07 DIAGNOSIS — E785 Hyperlipidemia, unspecified: Secondary | ICD-10-CM

## 2022-03-07 DIAGNOSIS — Z794 Long term (current) use of insulin: Secondary | ICD-10-CM | POA: Diagnosis not present

## 2022-03-07 DIAGNOSIS — T451X5A Adverse effect of antineoplastic and immunosuppressive drugs, initial encounter: Secondary | ICD-10-CM

## 2022-03-07 DIAGNOSIS — F19982 Other psychoactive substance use, unspecified with psychoactive substance-induced sleep disorder: Secondary | ICD-10-CM

## 2022-03-07 DIAGNOSIS — R5382 Chronic fatigue, unspecified: Secondary | ICD-10-CM

## 2022-03-07 DIAGNOSIS — K219 Gastro-esophageal reflux disease without esophagitis: Secondary | ICD-10-CM | POA: Diagnosis not present

## 2022-03-07 DIAGNOSIS — F1120 Opioid dependence, uncomplicated: Secondary | ICD-10-CM

## 2022-03-07 DIAGNOSIS — E6609 Other obesity due to excess calories: Secondary | ICD-10-CM

## 2022-03-07 DIAGNOSIS — F322 Major depressive disorder, single episode, severe without psychotic features: Secondary | ICD-10-CM

## 2022-03-07 DIAGNOSIS — Z6831 Body mass index (BMI) 31.0-31.9, adult: Secondary | ICD-10-CM

## 2022-03-07 DIAGNOSIS — Z170 Estrogen receptor positive status [ER+]: Secondary | ICD-10-CM

## 2022-03-07 DIAGNOSIS — G620 Drug-induced polyneuropathy: Secondary | ICD-10-CM

## 2022-03-07 DIAGNOSIS — E559 Vitamin D deficiency, unspecified: Secondary | ICD-10-CM

## 2022-03-07 DIAGNOSIS — F17219 Nicotine dependence, cigarettes, with unspecified nicotine-induced disorders: Secondary | ICD-10-CM

## 2022-03-07 DIAGNOSIS — E1169 Type 2 diabetes mellitus with other specified complication: Secondary | ICD-10-CM

## 2022-03-07 LAB — MICROALBUMIN, URINE WAIVED
Creatinine, Urine Waived: 50 mg/dL (ref 10–300)
Microalb, Ur Waived: 150 mg/L — ABNORMAL HIGH (ref 0–19)
Microalb/Creat Ratio: >300 mg/g — ABNORMAL HIGH (ref <30)

## 2022-03-07 LAB — BAYER DCA HB A1C WAIVED: HB A1C (BAYER DCA - WAIVED): 8.4 % — ABNORMAL HIGH (ref 4.8–5.6)

## 2022-03-07 MED ORDER — SUMATRIPTAN SUCCINATE 100 MG PO TABS: 100.0000 mg | ORAL_TABLET | ORAL | 0 refills | Status: DC | PRN

## 2022-03-07 NOTE — Assessment & Plan Note (Signed)
Chronic, ongoing.  Imitrex as needed.  Continue this regimen and adjust as needed.

## 2022-03-07 NOTE — Assessment & Plan Note (Signed)
Chronic, ongoing.  Continue current medication regimen and collaboration with psychiatry.  Currently denies SI/HI.

## 2022-03-07 NOTE — Assessment & Plan Note (Signed)
Chronic, stable.  Followed by Dr. Nicolasa Ducking with psychiatry, continue this collaboration.

## 2022-03-07 NOTE — Assessment & Plan Note (Signed)
Chronic, ongoing.  Continue current pain management collaboration and medication regimen as prescribed by them.

## 2022-03-07 NOTE — Assessment & Plan Note (Signed)
Followed by pain management, continue this collaboration, recent notes reviewed.

## 2022-03-07 NOTE — Assessment & Plan Note (Signed)
Chronic, ongoing.  Continue Rosuvastatin 40 MG daily.  Lipid panel today and adjust dose as needed.

## 2022-03-07 NOTE — Assessment & Plan Note (Signed)
Will place referral with [facility] group for sleep study as may be able to aid patient more and obtain equipment for her.  This may help fatigue.

## 2022-03-07 NOTE — Assessment & Plan Note (Signed)
Ongoing, stable. Baseline BP between 90-110/60-70 on review going back to 2020.  Recommend to continue to take plenty of water intake daily and add a some salt to diet + wear compression on during day and off at night.  She has seen cardiology with overall stable work-up.

## 2022-03-07 NOTE — Assessment & Plan Note (Signed)
Chronic, stable.  Continue current regimen and adjust as needed. Mag level today.

## 2022-03-07 NOTE — Progress Notes (Signed)
BP 100/67   Pulse 76   Temp 98 F (36.7 C) (Oral)   Ht 5' 4.02" (1.626 m)   Wt 193 lb 8 oz (87.8 kg)   LMP  (LMP Unknown) Comment: LAST PERIOD IN MAY 2019 WHEN SHE STARTED CHEMO  SpO2 98%   BMI 33.20 kg/m    Subjective:    Patient ID: Carolyn Roy, female    DOB: 31-Jan-1966, 57 y.o.   MRN: [id number]  HPI: Carolyn Roy is a 57 y.o. female  Chief Complaint  Patient presents with   Diabetes   Hypertension   Hyperlipidemia   Gastroesophageal Reflux   Mood   Cancer   DIABETES Last A1c 7.7% in July. Continues on Metformin 1000 MG BID, Tradjenta 5 MG, and Basaglar 40 units.  Can not take Jardiance at this time as too expensive.  Trulicity stopped due to GI effects.  No steroid use recently.  New insurance will not cover Sierra or Tradjenta,  she reports pharmacy would not tell her what is covered.  Currently has been out of insulin for a week.  Continues occasional Famotidine for GERD.  She is a smoker, 1/2 PPD.  Quit for 6 years, but then started back.  She started smoking at age 53, smoked >35 years total. Hypoglycemic episodes:no Polydipsia/polyuria: no Visual disturbance: no Chest pain: no Paresthesias: no Glucose Monitoring: yes  Accucheck frequency: occasional  Fasting glucose: 100 to 153  Post prandial:   Evening:   Before meals:  Taking Insulin?: yes  Long acting insulin: 34 units  Short acting insulin: Blood Pressure Monitoring: not checking Retinal Examination: Not Up To Date Foot Exam: Up to Date Pneumovax: Up To Date Influenza: Up To Date Aspirin: no   HYPOTENSION Saw cardiology for this on 12/01/21 -- was told cardiac work-up was good and is wearing compression shorts daily.   Recurrent headaches: at times at baseline migraines Visual changes: none Palpitations: occasional, baseline Dyspnea: no Chest pain: no Lower extremity edema: no Dizzy/lightheaded: no, but gets a little off balance at times  HYPERLIPIDEMIA Taking Rosuvastatin  40 MG daily.  Had [facility] in [facility], she reports Dr. Mickeal Skinner told her they want to do a CPAP titration -- does not have CPAP at this time.  Oncology has reached out to sleep center but not heard from them on this. Satisfied with current treatment? yes Duration of hyperlipidemia: chronic Cholesterol medication side effects: no Cholesterol supplements: none Aspirin: no Recent stressors: no Recurrent headaches: no Visual changes: no Palpitations: no Dyspnea: no Chest pain: no Lower extremity edema: no Dizzy/lightheaded: no  The ASCVD Risk score (Arnett DK, et al., 2019) failed to calculate for the following reasons:   The valid total cholesterol range is 130 to 320 mg/dL   CHRONIC PAIN  Followed by pain clinic for chronic pain -- last visit 02/21/22 continues Oxycodone and Pregabalin.    Has chronic migraines and continues on Imitrex and Amitriptyline as needed. Pain control status: stable Duration: chronic Location: knees and back Quality: dull, aching, and throbbing Current Pain Level: 7/10 Previous Pain Level: 10/10 Breakthrough pain: no Benefit from narcotic medications: yes What Activities task can be accomplished with current medication? Able to work and perform ADLs Interested in weaning off narcotics:no   Stool softners/OTC fiber: yes  Previous pain specialty evaluation: yes Non-narcotic analgesic meds: no Narcotic contract: yes   DEPRESSION Taking Duloxetine 60 MG daily, Lamictal 25 MG BID,  + Amitriptyline PRN.  Followed by Dr. Nicolasa Ducking,  last visit 10/25/21 Mood status: stable  States she occasionally takes duloxetine. Satisfied with current treatment?: yes Symptom severity: stable to mild  Duration of current treatment : chronic Side effects: no Medication compliance: good compliance Psychotherapy/counseling: none Depressed mood: no Anxious mood: no Anhedonia: no Significant weight loss or gain: no Insomnia: yes hard to stay asleep Fatigue:  no Feelings of worthlessness or guilt: no Impaired concentration/indecisiveness: no Suicidal ideations: no Hopelessness: no Crying spells: Yes, has become very frequent    03/07/2022   10:20 AM 12/03/2021    2:32 PM 11/24/2021    2:55 PM 08/25/2021    2:14 PM 07/28/2021    2:50 PM  Depression screen PHQ 2/9  Decreased Interest 0 0 0 0 1  Down, Depressed, Hopeless 1 1 0 0 0  PHQ - 2 Score 1 1 0 0 1  Altered sleeping '1 1  1 1  '$ Tired, decreased energy '1 1  1 1  '$ Change in appetite '1 1  1 1  '$ Feeling bad or failure about yourself  0 0  0 0  Trouble concentrating 0 0  0 0  Moving slowly or fidgety/restless 0 2  1 0  Suicidal thoughts 0 0  0 0  PHQ-9 Score '4 6  4 4  '$ Difficult doing work/chores Somewhat difficult Somewhat difficult  Somewhat difficult Not difficult at all      03/07/2022   10:21 AM 12/03/2021    2:33 PM 08/25/2021    2:14 PM 07/28/2021    2:51 PM  GAD 7 : Generalized Anxiety Score  Nervous, Anxious, on Edge 0 '1 1 1  '$ Control/stop worrying 0 0 0 0  Worry too much - different things 0 0 1 1  Trouble relaxing 0 0 0 0  Restless 0 0 0 0  Easily annoyed or irritable 1 0 0 1  Afraid - awful might happen 0 0 0 1  Total GAD 7 Score '1 1 2 4  '$ Anxiety Difficulty Somewhat difficult Somewhat difficult Not difficult at all Not difficult at all    Relevant past medical, surgical, family and social history reviewed and updated as indicated. Interim medical history since our last visit reviewed. Allergies and medications reviewed and updated.  Review of Systems  Constitutional:  Negative for activity change, appetite change, fatigue and fever.  Respiratory:  Negative for cough, chest tightness and shortness of breath.   Cardiovascular:  Negative for chest pain, palpitations and leg swelling.  Gastrointestinal: Negative.   Endocrine: Negative for cold intolerance, heat intolerance, polydipsia, polyphagia and polyuria.  Genitourinary:  Negative for difficulty urinating.   Musculoskeletal:  Negative for myalgias.  Neurological: Negative.  Negative for dizziness and light-headedness.  Psychiatric/Behavioral:  Negative for decreased concentration, self-injury, sleep disturbance and suicidal ideas. The patient is not nervous/anxious.    Per HPI unless specifically indicated above     Objective:    BP 100/67   Pulse 76   Temp 98 F (36.7 C) (Oral)   Ht 5' 4.02" (1.626 m)   Wt 193 lb 8 oz (87.8 kg)   LMP  (LMP Unknown) Comment: LAST PERIOD IN MAY 2019 WHEN SHE STARTED CHEMO  SpO2 98%   BMI 33.20 kg/m   Wt Readings from Last 3 Encounters:  03/07/22 193 lb 8 oz (87.8 kg)  03/01/22 191 lb (86.6 kg)  02/21/22 195 lb (88.5 kg)    Physical Exam Vitals and nursing note reviewed.  Constitutional:      General: She is awake. She  is not in acute distress.    Appearance: She is well-developed and well-groomed. She is obese. She is not ill-appearing or toxic-appearing.  HENT:     Head: Normocephalic.     Right Ear: Hearing normal.     Left Ear: Hearing normal.  Eyes:     General: Lids are normal.        Right eye: No discharge.        Left eye: No discharge.     Conjunctiva/sclera: Conjunctivae normal.     Pupils: Pupils are equal, round, and reactive to light.  Neck:     Thyroid: No thyromegaly.     Vascular: No carotid bruit.  Cardiovascular:     Rate and Rhythm: Normal rate and regular rhythm.     Pulses:          Dorsalis pedis pulses are 2+ on the right side and 2+ on the left side.     Heart sounds: Normal heart sounds. No murmur heard.    No gallop.  Pulmonary:     Effort: Pulmonary effort is normal. No accessory muscle usage or respiratory distress.     Breath sounds: Normal breath sounds. No wheezing or rhonchi.  Abdominal:     General: Bowel sounds are normal.     Palpations: Abdomen is soft.  Musculoskeletal:     Cervical back: Normal range of motion and neck supple.     Right lower leg: No edema.     Left lower leg: No edema.   Lymphadenopathy:     Cervical: No cervical adenopathy.  Skin:    General: Skin is warm and dry.  Neurological:     Mental Status: She is alert and oriented to person, place, and time.     Cranial Nerves: Cranial nerves 2-12 are intact.     Motor: Motor function is intact.     Coordination: Coordination is intact.     Gait: Gait is intact.     Deep Tendon Reflexes: Reflexes are normal and symmetric.     Reflex Scores:      Brachioradialis reflexes are 2+ on the right side and 2+ on the left side.      Patellar reflexes are 2+ on the right side and 2+ on the left side. Psychiatric:        Attention and Perception: Attention normal.        Mood and Affect: Mood normal.        Speech: Speech normal.        Behavior: Behavior normal. Behavior is cooperative.        Thought Content: Thought content normal.    Results for orders placed or performed in visit on 03/01/22  Cancer antigen 27.29  Result Value Ref Range   CA 27.29 <9.0 0.0 - 38.6 U/mL  Ferritin  Result Value Ref Range   Ferritin 149 11 - 307 ng/mL  Iron and TIBC  Result Value Ref Range   Iron 65 28 - 170 ug/dL   TIBC 319 250 - 450 ug/dL   Saturation Ratios 20 10.4 - 31.8 %   UIBC 254 ug/dL  Comprehensive metabolic panel  Result Value Ref Range   Sodium 136 135 - 145 mmol/L   Potassium 3.6 3.5 - 5.1 mmol/L   Chloride 100 98 - 111 mmol/L   CO2 25 22 - 32 mmol/L   Glucose, Bld 330 (H) 70 - 99 mg/dL   BUN 16 6 - 20 mg/dL   Creatinine, Ser 1.22 (  H) 0.44 - 1.00 mg/dL   Calcium 9.1 8.9 - 10.3 mg/dL   Total Protein 6.8 6.5 - 8.1 g/dL   Albumin 3.8 3.5 - 5.0 g/dL   AST 21 15 - 41 U/L   ALT 14 0 - 44 U/L   Alkaline Phosphatase 77 38 - 126 U/L   Total Bilirubin 0.2 (L) 0.3 - 1.2 mg/dL   GFR, Estimated 53 (L) >60 mL/min   Anion gap 11 5 - 15  CBC with Differential/Platelet  Result Value Ref Range   WBC 7.1 4.0 - 10.5 K/uL   RBC 3.89 3.87 - 5.11 MIL/uL   Hemoglobin 12.3 12.0 - 15.0 g/dL   HCT 36.6 36.0 - 46.0 %    MCV 94.1 80.0 - 100.0 fL   MCH 31.6 26.0 - 34.0 pg   MCHC 33.6 30.0 - 36.0 g/dL   RDW 12.8 11.5 - 15.5 %   Platelets 175 150 - 400 K/uL   nRBC 0.0 0.0 - 0.2 %   Neutrophils Relative % 66 %   Neutro Abs 4.7 1.7 - 7.7 K/uL   Lymphocytes Relative 26 %   Lymphs Abs 1.9 0.7 - 4.0 K/uL   Monocytes Relative 6 %   Monocytes Absolute 0.4 0.1 - 1.0 K/uL   Eosinophils Relative 1 %   Eosinophils Absolute 0.1 0.0 - 0.5 K/uL   Basophils Relative 1 %   Basophils Absolute 0.0 0.0 - 0.1 K/uL   Immature Granulocytes 0 %   Abs Immature Granulocytes 0.03 0.00 - 0.07 K/uL      Assessment & Plan:   Problem List Items Addressed This Visit       Cardiovascular and Mediastinum   Hypotension    Ongoing, stable. Baseline BP between 90-110/60-70 on review going back to 2020.  Recommend to continue to take plenty of water intake daily and add a some salt to diet + wear compression on during day and off at night.  She has seen cardiology with overall stable work-up.      Migraine headache without aura    Chronic, ongoing.  Imitrex as needed.  Continue this regimen and adjust as needed.      Relevant Medications   SUMAtriptan (IMITREX) 100 MG tablet     Digestive   GERD (gastroesophageal reflux disease)    Chronic, stable.  Continue current regimen and adjust as needed. Mag level today.      Relevant Orders   Magnesium     Endocrine   Hyperlipidemia associated with type 2 diabetes mellitus (HCC)    Chronic, ongoing.  Continue Rosuvastatin 40 MG daily.  Lipid panel today and adjust dose as needed.      Relevant Orders   Bayer DCA Hb A1c Waived   Lipid Panel w/o Chol/HDL Ratio   Type 2 diabetes mellitus with hyperglycemia, with long-term current use of insulin (HCC) - Primary    Chronic, ongoing.  A1c 8.4% today, prior was 7.7%, trending up with due to missing some insulin doses -- insurance changes recently. Eye exam and foot exam up to date.  Can not take Jardiance due to cost. Trulicity  caused GI issues - Tyler Aas sample provided in office today, will start 45 units and she is to reach out to insurance to see what long acting insulin will be covered to replace her Basaglar.   -  Will continue Metformin and Tradjenta at this time, but she is to reach out to insurance to see what will be covered under DPP 4  category since Tradjenta will not be and to see if an SGLT 2 will be covered -- if so will start one.  Discussed at length with her and wrote all questions to ask her insurance down, she will contact provider with answers and we will determine next steps. -  Recommend monitor BS at home TID and heavily focus on diet changes.    - Return in 3 months for A1c check, sooner if we are able to add on SGLT2.  Will have return to endo if ongoing poor control.      Relevant Orders   Bayer DCA Hb A1c Waived   Microalbumin, Urine Waived     Nervous and Auditory   Chemotherapy-induced neuropathy (HCC) (Chronic)    Chronic, ongoing.  Continue current pain management collaboration and medication regimen as prescribed by them.      Nicotine dependence, cigarettes, w unsp disorders    I have recommended complete cessation of tobacco use. I have discussed various options available for assistance with tobacco cessation including over the counter methods (Nicotine gum, patch and lozenges). We also discussed prescription options (Chantix, Nicotine Inhaler / Nasal Spray). The patient is not interested in pursuing any prescription tobacco cessation options at this time.  Recommend we start lung CT screening, she will plan on this next visit as has had a lot of testing recently.          Other   Chronic fatigue (Chronic)    Will place referral with [facility] group for sleep study as may be able to aid patient more and obtain equipment for her.  This may help fatigue.      Relevant Orders   Ambulatory referral to Sleep Studies   Chronic pain syndrome (Chronic)    Followed by pain  management, continue this collaboration, recent notes reviewed.      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  Followed by Dr. Nicolasa Ducking with psychiatry, continue this collaboration.      Relevant Orders   Ambulatory referral to Sleep Studies   Malignant neoplasm of lower-outer quadrant of left breast of female, estrogen receptor positive ([place])    Ongoing.  Continue collaboration with oncology, recent note reviewed.      Obesity   Severe depression (HCC)    Chronic, ongoing.  Continue current medication regimen and collaboration with psychiatry.  Currently denies SI/HI.        Follow up plan: Return in about 3 months (around 06/06/2022) for T2DM, HTN/HLD, MOOD, CHRONIC PAIN.

## 2022-03-07 NOTE — Assessment & Plan Note (Signed)
Ongoing.  Continue collaboration with oncology, recent note reviewed.

## 2022-03-07 NOTE — Assessment & Plan Note (Signed)
I have recommended complete cessation of tobacco use. I have discussed various options available for assistance with tobacco cessation including over the counter methods (Nicotine gum, patch and lozenges). We also discussed prescription options (Chantix, Nicotine Inhaler / Nasal Spray). The patient is not interested in pursuing any prescription tobacco cessation options at this time.  Recommend we start lung CT screening, she will plan on this next visit as has had a lot of testing recently.

## 2022-03-07 NOTE — Assessment & Plan Note (Addendum)
Chronic, ongoing.  A1c 8.4% today, prior was 7.7%, trending up with due to missing some insulin doses -- insurance changes recently. Eye exam and foot exam up to date.  Can not take Jardiance due to cost. Trulicity caused GI issues - Tyler Aas sample provided in office today, will start 45 units and she is to reach out to insurance to see what long acting insulin will be covered to replace her Basaglar.   -  Will continue Metformin and Tradjenta at this time, but she is to reach out to insurance to see what will be covered under DPP 4 category since Tradjenta will not be and to see if an SGLT 2 will be covered -- if so will start one.  Discussed at length with her and wrote all questions to ask her insurance down, she will contact provider with answers and we will determine next steps. -  Recommend monitor BS at home TID and heavily focus on diet changes.    - Return in 3 months for A1c check, sooner if we are able to add on SGLT2.  Will have return to endo if ongoing poor control.

## 2022-03-08 ENCOUNTER — Other Ambulatory Visit: Payer: Self-pay | Admitting: Nurse Practitioner

## 2022-03-08 LAB — LIPID PANEL W/O CHOL/HDL RATIO
Cholesterol, Total: 123 mg/dL (ref 100–199)
HDL: 50 mg/dL (ref >39)
LDL Chol Calc (NIH): 53 mg/dL (ref 0–99)
Triglycerides: 107 mg/dL (ref 0–149)
VLDL Cholesterol Cal: 20 mg/dL (ref 5–40)

## 2022-03-08 LAB — MAGNESIUM: Magnesium: 2.3 mg/dL (ref 1.6–2.3)

## 2022-03-08 NOTE — Telephone Encounter (Signed)
Requested Prescriptions  Pending Prescriptions Disp Refills   lamoTRIgine (LAMICTAL) 25 MG tablet [Pharmacy Med Name: LAMOTRIGINE 25 MG TABLET] 180 tablet 1    Sig: TAKE 1 TABLET BY MOUTH TWICE A DAY     Neurology:  Anticonvulsants - lamotrigine Failed - 03/08/2022  1:38 AM      Failed - Cr in normal range and within 360 days    Creatinine, Ser  Date Value Ref Range Status  03/01/2022 1.22 (H) 0.44 - 1.00 mg/dL Final         Passed - ALT in normal range and within 360 days    ALT  Date Value Ref Range Status  03/01/2022 14 0 - 44 U/L Final         Passed - AST in normal range and within 360 days    AST  Date Value Ref Range Status  03/01/2022 21 15 - 41 U/L Final         Passed - Completed PHQ-2 or PHQ-9 in the last 360 days      Passed - Valid encounter within last 12 months    Recent Outpatient Visits           Yesterday Type 2 diabetes mellitus with hyperglycemia, with long-term current use of insulin ([place])   [place], [place] T, NP   3 months ago Type 2 diabetes mellitus with hyperglycemia, with long-term current use of insulin ([place])   [place], [place] T, NP   6 months ago Type 2 diabetes mellitus with hyperglycemia, with long-term current use of insulin ([place])   [place], [place] T, NP   7 months ago Type 2 diabetes mellitus with hyperglycemia, with long-term current use of insulin ([place])   [place], [place] T, NP   9 months ago Type 2 diabetes mellitus with hyperglycemia, with long-term current use of insulin ([place])   [place] [employer], Barbaraann Faster, NP       Future Appointments             In 3 months Cannady, Barbaraann Faster, NP [place], [facility]

## 2022-03-08 NOTE — Progress Notes (Signed)
Contacted via MyChart   Good evening Shakeeta, your labs have returned. Cholesterol labs are stable and Magnesium level normal.  Overall no medication changes needed.  Any questions? Keep being amazing!!  Thank you for allowing me to participate in your care.  I appreciate you. Kindest regards, Dayanna Pryce

## 2022-03-09 ENCOUNTER — Encounter: Payer: Self-pay | Admitting: Nurse Practitioner

## 2022-03-09 MED ORDER — LINAGLIPTIN 5 MG PO TABS: 5.0000 mg | ORAL_TABLET | Freq: Every day | ORAL | 4 refills | Status: DC

## 2022-03-09 MED ORDER — BASAGLAR KWIKPEN 100 UNIT/ML ~~LOC~~ SOPN: 45.0000 [IU] | PEN_INJECTOR | Freq: Every day | SUBCUTANEOUS | 4 refills | Status: DC

## 2022-03-24 ENCOUNTER — Other Ambulatory Visit: Payer: Self-pay | Admitting: Nurse Practitioner

## 2022-03-24 NOTE — Telephone Encounter (Signed)
Requested Prescriptions  Pending Prescriptions Disp Refills   rosuvastatin (CRESTOR) 40 MG tablet [Pharmacy Med Name: ROSUVASTATIN CALCIUM 40 MG TAB] 90 tablet 3    Sig: TAKE 1 TABLET (40 MG TOTAL) BY MOUTH DAILY. STOP TAKING ATORVASTATIN.     Cardiovascular:  Antilipid - Statins 2 Failed - 03/24/2022  5:37 AM      Failed - Cr in normal range and within 360 days    Creatinine, Ser  Date Value Ref Range Status  03/01/2022 1.22 (H) 0.44 - 1.00 mg/dL Final         Failed - Lipid Panel in normal range within the last 12 months    Cholesterol, Total  Date Value Ref Range Status  03/07/2022 123 100 - 199 mg/dL Final   Cholesterol [place], [place]  Date Value Ref Range Status  11/15/2016 WILL FOLLOW  Preliminary   LDL Chol Calc (NIH)  Date Value Ref Range Status  03/07/2022 53 0 - 99 mg/dL Final   HDL  Date Value Ref Range Status  03/07/2022 50 >39 mg/dL Final   Triglycerides  Date Value Ref Range Status  03/07/2022 107 0 - 149 mg/dL Final   Triglycerides Piccolo,Waived  Date Value Ref Range Status  11/15/2016 WILL FOLLOW  Preliminary         Passed - Patient is not pregnant      Passed - Valid encounter within last 12 months    Recent Outpatient Visits           2 weeks ago Type 2 diabetes mellitus with hyperglycemia, with long-term current use of insulin ([place])   [place], [place] T, NP   3 months ago Type 2 diabetes mellitus with hyperglycemia, with long-term current use of insulin ([place])   [place], [place] T, NP   7 months ago Type 2 diabetes mellitus with hyperglycemia, with long-term current use of insulin ([place])   [place], [place] T, NP   7 months ago Type 2 diabetes mellitus with hyperglycemia, with long-term current use of insulin ([place])   [place], [place] T, NP   10 months ago Type 2 diabetes mellitus with hyperglycemia, with  long-term current use of insulin ([place])   [place], Barbaraann Faster, NP       Future Appointments             In 2 months Cannady, Barbaraann Faster, NP [place], [facility]

## 2022-04-05 ENCOUNTER — Other Ambulatory Visit: Payer: Self-pay | Admitting: Nurse Practitioner

## 2022-04-05 NOTE — Telephone Encounter (Signed)
Requested Prescriptions  Pending Prescriptions Disp Refills   SUMAtriptan (IMITREX) 100 MG tablet [Pharmacy Med Name: SUMATRIPTAN SUCC 100 MG TABLET] 10 tablet 0    Sig: TAKE 1 TABLET BY MOUTH AS NEEDED FOR MIGRAINE. MAY REPEAT IN 2 HOURS IF HEADACHE PERSISTS OR RECURS AS DIRECTED.     Neurology:  Migraine Therapy - Triptan Passed - 04/05/2022  1:41 AM      Passed - Last BP in normal range    BP Readings from Last 1 Encounters:  03/07/22 100/67         Passed - Valid encounter within last 12 months    Recent Outpatient Visits           4 weeks ago Type 2 diabetes mellitus with hyperglycemia, with long-term current use of insulin ([place])   [place], [place] T, NP   4 months ago Type 2 diabetes mellitus with hyperglycemia, with long-term current use of insulin ([place])   [place], [place] T, NP   7 months ago Type 2 diabetes mellitus with hyperglycemia, with long-term current use of insulin ([place])   [place], [place] T, NP   8 months ago Type 2 diabetes mellitus with hyperglycemia, with long-term current use of insulin ([place])   [place], [place] T, NP   10 months ago Type 2 diabetes mellitus with hyperglycemia, with long-term current use of insulin ([place])   [place], Barbaraann Faster, NP       Future Appointments             In 2 months Cannady, Barbaraann Faster, NP [place], [facility]

## 2022-04-25 ENCOUNTER — Telehealth: Payer: Self-pay

## 2022-04-25 NOTE — Telephone Encounter (Signed)
PA started for Tradjenta 5mg  through [employer]. Awaiting on determination

## 2022-05-24 NOTE — Patient Instructions (Signed)
____________________________________________________________________________________________  Opioid Pain Medication Update  To: All patients taking opioid pain medications. (I.e.: hydrocodone, hydromorphone, oxycodone, oxymorphone, morphine, codeine, methadone, tapentadol, tramadol, buprenorphine, fentanyl, etc.)  Re: Updated review of side effects and adverse reactions of opioid analgesics, as well as new information about long term effects of this class of medications.  Direct risks of long-term opioid therapy are not limited to opioid addiction and overdose. Potential medical risks include serious fractures, breathing problems during sleep, hyperalgesia, immunosuppression, chronic constipation, bowel obstruction, myocardial infarction, and tooth decay secondary to xerostomia.  Unpredictable adverse effects that can occur even if you take your medication correctly: Cognitive impairment, respiratory depression, and death. Most people think that if they take their medication "correctly", and "as instructed", that they will be safe. Nothing could be farther from the truth. In reality, a significant amount of recorded deaths associated with the use of opioids has occurred in individuals that had taken the medication for a long time, and were taking their medication correctly. The following are examples of how this can happen: Patient taking his/her medication for a long time, as instructed, without any side effects, is given a certain antibiotic or another unrelated medication, which in turn triggers a "Drug-to-drug interaction" leading to disorientation, cognitive impairment, impaired reflexes, respiratory depression or an untoward event leading to serious bodily harm or injury, including death.  Patient taking his/her medication for a long time, as instructed, without any side effects, develops an acute impairment of liver and/or kidney function. This will lead to a rapid inability of the body to  breakdown and eliminate their pain medication, which will result in effects similar to an "overdose", but with the same medicine and dose that they had always taken. This again may lead to disorientation, cognitive impairment, impaired reflexes, respiratory depression or an untoward event leading to serious bodily harm or injury, including death.  A similar problem will occur with patients as they grow older and their liver and kidney function begins to decrease as part of the aging process.  Background information: Historically, the original case for using long-term opioid therapy to treat chronic noncancer pain was based on safety assumptions that subsequent experience has called into question. In 1996, the American Pain Society and the American Academy of Pain Medicine issued a consensus statement supporting long-term opioid therapy. This statement acknowledged the dangers of opioid prescribing but concluded that the risk for addiction was low; respiratory depression induced by opioids was short-lived, occurred mainly in opioid-naive patients, and was antagonized by pain; tolerance was not a common problem; and efforts to control diversion should not constrain opioid prescribing. This has now proven to be wrong. Experience regarding the risks for opioid addiction, misuse, and overdose in community practice has failed to support these assumptions.  According to the Centers for Disease Control and Prevention, fatal overdoses involving opioid analgesics have increased sharply over the past decade. Currently, more than 96,700 people die from drug overdoses every year. Opioids are a factor in 7 out of every 10 overdose deaths. Deaths from drug overdose have surpassed motor vehicle accidents as the leading cause of death for individuals between the ages of 35 and 54.  Clinical data suggest that neuroendocrine dysfunction may be very common in both men and women, potentially causing hypogonadism, erectile  dysfunction, infertility, decreased libido, osteoporosis, and depression. Recent studies linked higher opioid dose to increased opioid-related mortality. Controlled observational studies reported that long-term opioid therapy may be associated with increased risk for cardiovascular events. Subsequent meta-analysis concluded   that the safety of long-term opioid therapy in elderly patients has not been proven.   Side Effects and adverse reactions: Common side effects: Drowsiness (sedation). Dizziness. Nausea and vomiting. Constipation. Physical dependence -- Dependence often manifests with withdrawal symptoms when opioids are discontinued or decreased. Tolerance -- As you take repeated doses of opioids, you require increased medication to experience the same effect of pain relief. Respiratory depression -- This can occur in healthy people, especially with higher doses. However, people with COPD, asthma or other lung conditions may be even more susceptible to fatal respiratory impairment.  Uncommon side effects: An increased sensitivity to feeling pain and extreme response to pain (hyperalgesia). Chronic use of opioids can lead to this. Delayed gastric emptying (the process by which the contents of your stomach are moved into your small intestine). Muscle rigidity. Immune system and hormonal dysfunction. Quick, involuntary muscle jerks (myoclonus). Arrhythmia. Itchy skin (pruritus). Dry mouth (xerostomia).  Long-term side effects: Chronic constipation. Sleep-disordered breathing (SDB). Increased risk of bone fractures. Hypothalamic-pituitary-adrenal dysregulation. Increased risk of overdose.  RISKS: Fractures and Falls:  Opioids increase the risk and incidence of falls. This is of particular importance in elderly patients.  Endocrine System:  Long-term administration is associated with endocrine abnormalities (endocrinopathies). (Also known as Opioid-induced Endocrinopathy) Influences  on both the hypothalamic-pituitary-adrenal axis?and the hypothalamic-pituitary-gonadal axis have been demonstrated with consequent hypogonadism and adrenal insufficiency in both sexes. Hypogonadism and decreased levels of dehydroepiandrosterone sulfate have been reported in men and women. Endocrine effects include: Amenorrhoea in women (abnormal absence of menstruation) Reduced libido in both sexes Decreased sexual function Erectile dysfunction in men Hypogonadisms (decreased testicular function with shrinkage of testicles) Infertility Depression and fatigue Loss of muscle mass Anxiety Depression Immune suppression Hyperalgesia Weight gain Anemia Osteoporosis Patients (particularly women of childbearing age) should avoid opioids. There is insufficient evidence to recommend routine monitoring of asymptomatic patients taking opioids in the long-term for hormonal deficiencies.  Immune System: Human studies have demonstrated that opioids have an immunomodulating effect. These effects are mediated via opioid receptors both on immune effector cells and in the central nervous system. Opioids have been demonstrated to have adverse effects on antimicrobial response and anti-tumour surveillance. Buprenorphine has been demonstrated to have no impact on immune function.  Opioid Induced Hyperalgesia: Human studies have demonstrated that prolonged use of opioids can lead to a state of abnormal pain sensitivity, sometimes called opioid induced hyperalgesia (OIH). Opioid induced hyperalgesia is not usually seen in the absence of tolerance to opioid analgesia. Clinically, hyperalgesia may be diagnosed if the patient on long-term opioid therapy presents with increased pain. This might be qualitatively and anatomically distinct from pain related to disease progression or to breakthrough pain resulting from development of opioid tolerance. Pain associated with hyperalgesia tends to be more diffuse than the  pre-existing pain and less defined in quality. Management of opioid induced hyperalgesia requires opioid dose reduction.  Cancer: Chronic opioid therapy has been associated with an increased risk of cancer among noncancer patients with chronic pain. This association was more evident in chronic strong opioid users. Chronic opioid consumption causes significant pathological changes in the small intestine and colon. Epidemiological studies have found that there is a link between opium dependence and initiation of gastrointestinal cancers. Cancer is the second leading cause of death after cardiovascular disease. Chronic use of opioids can cause multiple conditions such as GERD, immunosuppression and renal damage as well as carcinogenic effects, which are associated with the incidence of cancers.   Mortality: Long-term opioid use   has been associated with increased mortality among patients with chronic non-cancer pain (CNCP).  Prescription of long-acting opioids for chronic noncancer pain was associated with a significantly increased risk of all-cause mortality, including deaths from causes other than overdose.  Reference: Von Korff M, Kolodny A, Deyo RA, Chou R. Long-term opioid therapy reconsidered. Ann Intern Med. 2011 Sep 6;155(5):325-8. doi: 10.7326/[id number]. PMID: 21893626; PMCID: PMC3280085. Bedson J, Chen Y, Ashworth J, Hayward RA, Dunn KM, Jordan KP. Risk of adverse events in patients prescribed long-term opioids: A cohort study in the [place] Clinical Practice Research Datalink. Eur J Pain. 2019 May;23(5):908-922. doi: 10.1002/ejp.1357. Epub 2019 Jan 31. PMID: 30620116. Colameco S, Coren JS, Ciervo CA. Continuous opioid treatment for chronic noncancer pain: a time for moderation in prescribing. Postgrad Med. 2009 Jul;121(4):61-6. doi: 10.3810/pgm.2009.07.2032. PMID: 19641271. Chou R, Turner JA, Devine EB, Hansen RN, Sullivan SD, Blazina I, Dana T, Bougatsos C, Deyo RA. The  effectiveness and risks of long-term opioid therapy for chronic pain: a systematic review for a National Institutes of Health Pathways to Prevention Workshop. Ann Intern Med. 2015 Feb 17;162(4):276-86. doi: 10.7326/M14-2559. PMID: 25581257. Warner M, Chen LH, Makuc DM. NCHS Data Brief No. 22. [place]: Centers for Disease Control and Prevention; 2009. Sep, Increase in Fatal Poisonings Involving Opioid Analgesics in the [place], 1999-2006. Song IA, Choi HR, Oh TK. Long-term opioid use and mortality in patients with chronic non-cancer pain: Ten-year follow-up study in [place] from 2010 through 2019. EClinicalMedicine. 2022 Jul 18;51:101558. doi: 10.1016/j.eclinm.2022.101558. PMID: 35875817; PMCID: PMC9304910. Huser, W., Schubert, T., Vogelmann, T. et al. All-cause mortality in patients with long-term opioid therapy compared with non-opioid analgesics for chronic non-cancer pain: a database study. BMC Med 18, 162 (2020). https://doi.org/10.1186/[email or web address] Rashidian H, Zendehdel K, Kamangar F, Malekzadeh R, Haghdoost AA. An Ecological Study of the Association between Opiate Use and Incidence of Cancers. Addict Health. 2016 Fall;8(4):252-260. PMID: 28819556; PMCID: PMC5554805.  Our Goal: Our goal is to control your pain with means other than the use of opioid pain medications.  Our Recommendation: Talk to your physician about coming off of these medications. We can assist you with the tapering down and stopping these medicines. Based on the new information, even if you cannot completely stop the medication, a decrease in the dose may be associated with a lesser risk. Ask for other means of controlling the pain. Decrease or eliminate those factors that significantly contribute to your pain such as smoking, obesity, and a diet heavily tilted towards "inflammatory" nutrients.  Last Updated: 04/06/2022    ____________________________________________________________________________________________     ____________________________________________________________________________________________  Patient Information update  To: All of our patients.  Re: Name change.  It has been made official that our current name, "[facility]"   will soon be changed to "[facility]".   The purpose of this change is to eliminate any confusion created by the concept of our practice being a "Medication Management Pain Clinic". In the past this has led to the misconception that we treat pain primarily by the use of prescription medications.  Nothing can be farther from the truth.   Understanding PAIN MANAGEMENT: To further understand what our practice does, you first have to understand that "Pain Management" is a subspecialty that requires additional training once a physician has completed their specialty training, which can be in either Anesthesia, Neurology, Psychiatry, or Physical Medicine and Rehabilitation (PMR). Each one of these contributes to the final approach taken by each physician to   the management of their patient's pain. To be a "Pain Management Specialist" you must have first completed one of the specialty trainings below.  Anesthesiologists - trained in clinical pharmacology and interventional techniques such as nerve blockade and regional as well as central neuroanatomy. They are trained to block pain before, during, and after surgical interventions.  Neurologists - trained in the diagnosis and pharmacological treatment of complex neurological conditions, such as Multiple Sclerosis, Parkinson's, spinal cord injuries, and other systemic conditions that may be associated with symptoms that may include but are not limited to pain. They tend to rely primarily on the treatment of chronic pain  using prescription medications.  Psychiatrist - trained in conditions affecting the psychosocial wellbeing of patients including but not limited to depression, anxiety, schizophrenia, personality disorders, addiction, and other substance use disorders that may be associated with chronic pain. They tend to rely primarily on the treatment of chronic pain using prescription medications.   Physical Medicine and Rehabilitation (PMR) physicians, also known as physiatrists - trained to treat a wide variety of medical conditions affecting the brain, spinal cord, nerves, bones, joints, ligaments, muscles, and tendons. Their training is primarily aimed at treating patients that have suffered injuries that have caused severe physical impairment. Their training is primarily aimed at the physical therapy and rehabilitation of those patients. They may also work alongside orthopedic surgeons or neurosurgeons using their expertise in assisting surgical patients to recover after their surgeries.  INTERVENTIONAL PAIN MANAGEMENT is sub-subspecialty of Pain Management.  Our physicians are Board-certified in Anesthesia, Pain Management, and Interventional Pain Management.  This meaning that not only have they been trained and Board-certified in their specialty of Anesthesia, and subspecialty of Pain Management, but they have also received further training in the sub-subspecialty of Interventional Pain Management, in order to become Board-certified as INTERVENTIONAL PAIN MANAGEMENT SPECIALIST.    Mission: Our goal is to use our skills in  INTERVENTIONAL PAIN MANAGEMENT as alternatives to the chronic use of prescription opioid medications for the treatment of pain. To make this more clear, we have changed our name to reflect what we do and offer. We will continue to offer medication management assessment and recommendations, but we will not be taking over any patient's medication  management.  ____________________________________________________________________________________________     ____________________________________________________________________________________________  National Pain Medication Shortage  The [place] is experiencing worsening drug shortages. These have had a negative widespread effect on patient care and treatment. Not expected to improve any time soon. Predicted to last past 2029.   Drug shortage list (generic names) Oxycodone IR Oxycodone/APAP Oxymorphone IR Hydromorphone Hydrocodone/APAP Morphine  Where is the problem?  Manufacturing and supply level.  Will this shortage affect you?  Only if you take any of the above pain medications.  How? You may be unable to fill your prescription.  Your pharmacist may offer a "partial fill" of your prescription. (Warning: Do not accept partial fills.) Prescriptions partially filled cannot be transferred to another pharmacy. Read our Medication Rules and Regulation. Depending on how much medicine you are dependent on, you may experience withdrawals when unable to get the medication.  Recommendations: Consider ending your dependence on opioid pain medications. Ask your pain specialist to assist you with the process. Consider switching to a medication currently not in shortage, such as Buprenorphine. Talk to your pain specialist about this option. Consider decreasing your pain medication requirements by managing tolerance thru "Drug Holidays". This may help minimize withdrawals, should you run out of medicine. Control your pain thru   the use of non-pharmacological interventional therapies.   Your prescriber: Prescribers cannot be blamed for shortages. Medication manufacturing and supply issues cannot be fixed by the prescriber.   NOTE: The prescriber is not responsible for supplying the medication, or solving supply issues. Work with your pharmacist to solve it. The patient is responsible for  the decision to take or continue taking the medication and for identifying and securing a legal supply source. By law, supplying the medication is the job and responsibility of the pharmacy. The prescriber is responsible for the evaluation, monitoring, and prescribing of these medications.   Prescribers will NOT: Re-issue prescriptions that have been partially filled. Re-issue prescriptions already sent to a pharmacy.  Re-send prescriptions to a different pharmacy because yours did not have your medication. Ask pharmacist to order more medicine or transfer the prescription to another pharmacy. (Read below.)  New 2023 regulation: "October 08, 2021 Revised Regulation Allows DEA-Registered Pharmacies to Transfer Electronic Prescriptions at a Patient's Request DEA Headquarters Division - Public Information Office Patients now have the ability to request their electronic prescription be transferred to another pharmacy without having to go back to their practitioner to initiate the request. This revised regulation went into effect on Monday, October 04, 2021.     At a patient's request, a DEA-registered retail pharmacy can now transfer an electronic prescription for a controlled substance (schedules II-V) to another DEA-registered retail pharmacy. Prior to this change, patients would have to go through their practitioner to cancel their prescription and have it re-issued to a different pharmacy. The process was taxing and time consuming for both patients and practitioners.    The Drug Enforcement Administration (DEA) published its intent to revise the process for transferring electronic prescriptions on December 27, 2019.  The final rule was published in the federal register on September 02, 2021 and went into effect 30 days later.  Under the final rule, a prescription can only be transferred once between pharmacies, and only if allowed under existing state or other applicable law. The prescription must  remain in its electronic form; may not be altered in any way; and the transfer must be communicated directly between two licensed pharmacists. It's important to note, any authorized refills transfer with the original prescription, which means the entire prescription will be filled at the same pharmacy".  Reference: https://www.dea.gov/stories/2023/2023-10/2021-09-01/revised-regulation-allows-dea-registered-pharmacies-transfer (DEA website announcement)  https://www.govinfo.gov/content/pkg/FR-2021-09-02/pdf/2023-15847.pdf (Federal Register  Department of Justice)   Federal Register / Vol. 88, No. 143 / Thursday, September 02, 2021 / Rules and Regulations DEPARTMENT OF JUSTICE  Drug Enforcement Administration  21 CFR Part 1306  [Docket No. [id number]]  RIN [id number]-AB64 Transfer of Electronic Prescriptions for Schedules II-V Controlled Substances Between Pharmacies for Initial Filling  ____________________________________________________________________________________________     ____________________________________________________________________________________________  Transfer of Pain Medication between Pharmacies  Re: 2023 DEA Clarification on existing regulation  Published on DEA Website: October 08, 2021  Title: Revised Regulation Allows DEA-Registered Pharmacies to Transfer Electronic Prescriptions at a Patient's Request DEA Headquarters Division - Public Information Office  "Patients now have the ability to request their electronic prescription be transferred to another pharmacy without having to go back to their practitioner to initiate the request. This revised regulation went into effect on Monday, October 04, 2021.     At a patient's request, a DEA-registered retail pharmacy can now transfer an electronic prescription for a controlled substance (schedules II-V) to another DEA-registered retail pharmacy. Prior to this change, patients would have to go through their practitioner to  cancel their prescription   and have it re-issued to a different pharmacy. The process was taxing and time consuming for both patients and practitioners.    The Drug Enforcement Administration (DEA) published its intent to revise the process for transferring electronic prescriptions on December 27, 2019.  The final rule was published in the federal register on September 02, 2021 and went into effect 30 days later.  Under the final rule, a prescription can only be transferred once between pharmacies, and only if allowed under existing state or other applicable law. The prescription must remain in its electronic form; may not be altered in any way; and the transfer must be communicated directly between two licensed pharmacists. It's important to note, any authorized refills transfer with the original prescription, which means the entire prescription will be filled at the same pharmacy."    REFERENCES: 1. DEA website announcement https://www.dea.gov/stories/2023/2023-10/2021-09-01/revised-regulation-allows-dea-registered-pharmacies-transfer  2. Department of Justice website  https://www.govinfo.gov/content/pkg/FR-2021-09-02/pdf/2023-15847.pdf  3. DEPARTMENT OF JUSTICE Drug Enforcement Administration 21 CFR Part 1306 [Docket No. [id number]] RIN [id number]-AB64 "Transfer of Electronic Prescriptions for Schedules II-V Controlled Substances Between Pharmacies for Initial Filling"  ____________________________________________________________________________________________     _______________________________________________________________________  Medication Rules  Purpose: To inform patients, and their family members, of our medication rules and regulations.  Applies to: All patients receiving prescriptions from our practice (written or electronic).  Pharmacy of record: This is the pharmacy where your electronic prescriptions will be sent. Make sure we have the correct one.  Electronic prescriptions: In  compliance with the [place] Strengthen Opioid Misuse Prevention (STOP) Act of 2017 (Session Law 2017-74/H243), effective February 07, 2018, all controlled substances must be electronically prescribed. Written prescriptions, faxing, or calling prescriptions to a pharmacy will no longer be done.  Prescription refills: These will be provided only during in-person appointments. No medications will be renewed without a "face-to-face" evaluation with your provider. Applies to all prescriptions.  NOTE: The following applies primarily to controlled substances (Opioid* Pain Medications).   Type of encounter (visit): For patients receiving controlled substances, face-to-face visits are required. (Not an option and not up to the patient.)  Patient's responsibilities: Pain Pills: Bring all pain pills to every appointment (except for procedure appointments). Pill Bottles: Bring pills in original pharmacy bottle. Bring bottle, even if empty. Always bring the bottle of the most recent fill.  Medication refills: You are responsible for knowing and keeping track of what medications you are taking and when is it that you will need a refill. The day before your appointment: write a list of all prescriptions that need to be refilled. The day of the appointment: give the list to the admitting nurse. Prescriptions will be written only during appointments. No prescriptions will be written on procedure days. If you forget a medication: it will not be "Called in", "Faxed", or "electronically sent". You will need to get another appointment to get these prescribed. No early refills. Do not call asking to have your prescription filled early. Partial  or short prescriptions: Occasionally your pharmacy may not have enough pills to fill your prescription.  NEVER ACCEPT a partial fill or a prescription that is short of the total amount of pills that you were prescribed.  With controlled substances the law allows 72 hours for  the pharmacy to complete the prescription.  If the prescription is not completed within 72 hours, the pharmacist will require a new prescription to be written. This means that you will be short on your medicine and we WILL NOT send another prescription to complete your original   prescription.  Instead, request the pharmacy to send a carrier to a nearby branch to get enough medication to provide you with your full prescription. Prescription Accuracy: You are responsible for carefully inspecting your prescriptions before leaving our office. Have the discharge nurse carefully go over each prescription with you, before taking them home. Make sure that your name is accurately spelled, that your address is correct. Check the name and dose of your medication to make sure it is accurate. Check the number of pills, and the written instructions to make sure they are clear and accurate. Make sure that you are given enough medication to last until your next medication refill appointment. Taking Medication: Take medication as prescribed. When it comes to controlled substances, taking less pills or less frequently than prescribed is permitted and encouraged. Never take more pills than instructed. Never take the medication more frequently than prescribed.  Inform other Doctors: Always inform, all of your healthcare providers, of all the medications you take. Pain Medication from other Providers: You are not allowed to accept any additional pain medication from any other Doctor or Healthcare provider. There are two exceptions to this rule. (see below) In the event that you require additional pain medication, you are responsible for notifying us, as stated below. Cough Medicine: Often these contain an opioid, such as codeine or hydrocodone. Never accept or take cough medicine containing these opioids if you are already taking an opioid* medication. The combination may cause respiratory failure and death. Medication Agreement:  You are responsible for carefully reading and following our Medication Agreement. This must be signed before receiving any prescriptions from our practice. Safely store a copy of your signed Agreement. Violations to the Agreement will result in no further prescriptions. (Additional copies of our Medication Agreement are available upon request.) Laws, Rules, & Regulations: All patients are expected to follow all Federal and State Laws, Statutes, Rules, & Regulations. Ignorance of the Laws does not constitute a valid excuse.  Illegal drugs and Controlled Substances: The use of illegal substances (including, but not limited to marijuana and its derivatives) and/or the illegal use of any controlled substances is strictly prohibited. Violation of this rule may result in the immediate and permanent discontinuation of any and all prescriptions being written by our practice. The use of any illegal substances is prohibited. Adopted CDC guidelines & recommendations: Target dosing levels will be at or below 60 MME/day. Use of benzodiazepines** is not recommended.  Exceptions: There are only two exceptions to the rule of not receiving pain medications from other Healthcare Providers. Exception #1 (Emergencies): In the event of an emergency (i.e.: accident requiring emergency care), you are allowed to receive additional pain medication. However, you are responsible for: As soon as you are able, call our office [phone], at any time of the day or night, and leave a message stating your name, the date and nature of the emergency, and the name and dose of the medication prescribed. In the event that your call is answered by a member of our staff, make sure to document and save the date, time, and the name of the person that took your information.  Exception #2 (Planned Surgery): In the event that you are scheduled by another doctor or dentist to have any type of surgery or procedure, you are allowed (for a period no  longer than 30 days), to receive additional pain medication, for the acute post-op pain. However, in this case, you are responsible for picking up a copy of   our "Post-op Pain Management for Surgeons" handout, and giving it to your surgeon or dentist. This document is available at our office, and does not require an appointment to obtain it. Simply go to our office during business hours (Monday-Thursday from 8:00 AM to 4:00 PM) (Friday 8:00 AM to 12:00 Noon) or if you have a scheduled appointment with us, prior to your surgery, and ask for it by name. In addition, you are responsible for: calling our office [phone], at any time of the day or night, and leaving a message stating your name, name of your surgeon, type of surgery, and date of procedure or surgery. Failure to comply with your responsibilities may result in termination of therapy involving the controlled substances. Medication Agreement Violation. Following the above rules, including your responsibilities will help you in avoiding a Medication Agreement Violation ("Breaking your Pain Medication Contract").  Consequences:  Not following the above rules may result in permanent discontinuation of medication prescription therapy.  *Opioid medications include: morphine, codeine, oxycodone, oxymorphone, hydrocodone, hydromorphone, meperidine, tramadol, tapentadol, buprenorphine, fentanyl, methadone. **Benzodiazepine medications include: diazepam (Valium), alprazolam (Xanax), clonazepam (Klonopine), lorazepam (Ativan), clorazepate (Tranxene), chlordiazepoxide (Librium), estazolam (Prosom), oxazepam (Serax), temazepam (Restoril), triazolam (Halcion) (Last updated: 11/30/2021) ______________________________________________________________________    ______________________________________________________________________  Medication Recommendations and Reminders  Applies to: All patients receiving prescriptions (written and/or  electronic).  Medication Rules & Regulations: You are responsible for reading, knowing, and following our "Medication Rules" document. These exist for your safety and that of others. They are not flexible and neither are we. Dismissing or ignoring them is an act of "non-compliance" that may result in complete and irreversible termination of such medication therapy. For safety reasons, "non-compliance" will not be tolerated. As with the [place] fundamental legal principle of "ignorance of the law is no defense", we will accept no excuses for not having read and knowing the content of documents provided to you by our practice.  Pharmacy of record:  Definition: This is the pharmacy where your electronic prescriptions will be sent.  We do not endorse any particular pharmacy. It is up to you and your insurance to decide what pharmacy to use.  We do not restrict you in your choice of pharmacy. However, once we write for your prescriptions, we will NOT be re-sending more prescriptions to fix restricted supply problems created by your pharmacy, or your insurance.  The pharmacy listed in the electronic medical record should be the one where you want electronic prescriptions to be sent. If you choose to change pharmacy, simply notify our nursing staff. Changes will be made only during your regular appointments and not over the phone.  Recommendations: Keep all of your pain medications in a safe place, under lock and key, even if you live alone. We will NOT replace lost, stolen, or damaged medication. We do not accept "Police Reports" as proof of medications having been stolen. After you fill your prescription, take 1 week's worth of pills and put them away in a safe place. You should keep a separate, properly labeled bottle for this purpose. The remainder should be kept in the original bottle. Use this as your primary supply, until it runs out. Once it's gone, then you know that you have 1 week's worth of medicine,  and it is time to come in for a prescription refill. If you do this correctly, it is unlikely that you will ever run out of medicine. To make sure that the above recommendation works, it is very important that you make   sure your medication refill appointments are scheduled at least 1 week before you run out of medicine. To do this in an effective manner, make sure that you do not leave the office without scheduling your next medication management appointment. Always ask the nursing staff to show you in your prescription , when your medication will be running out. Then arrange for the receptionist to get you a return appointment, at least 7 days before you run out of medicine. Do not wait until you have 1 or 2 pills left, to come in. This is very poor planning and does not take into consideration that we may need to cancel appointments due to bad weather, sickness, or emergencies affecting our staff. DO NOT ACCEPT A "Partial Fill": If for any reason your pharmacy does not have enough pills/tablets to completely fill or refill your prescription, do not allow for a "partial fill". The law allows the pharmacy to complete that prescription within 72 hours, without requiring a new prescription. If they do not fill the rest of your prescription within those 72 hours, you will need a separate prescription to fill the remaining amount, which we will NOT provide. If the reason for the partial fill is your insurance, you will need to talk to the pharmacist about payment alternatives for the remaining tablets, but again, DO NOT ACCEPT A PARTIAL FILL, unless you can trust your pharmacist to obtain the remainder of the pills within 72 hours.  Prescription refills and/or changes in medication(s):  Prescription refills, and/or changes in dose or medication, will be conducted only during scheduled medication management appointments. (Applies to both, written and electronic prescriptions.) No refills on procedure days. No  medication will be changed or started on procedure days. No changes, adjustments, and/or refills will be conducted on a procedure day. Doing so will interfere with the diagnostic portion of the procedure. No phone refills. No medications will be "called into the pharmacy". No Fax refills. No weekend refills. No Holliday refills. No after hours refills.  Remember:  Business hours are:  Monday to Thursday 8:00 AM to 4:00 PM Provider's Schedule: Damarian Priola, MD - Appointments are:  Medication management: Monday and Wednesday 8:00 AM to 4:00 PM Procedure day: Tuesday and Thursday 7:30 AM to 4:00 PM Bilal Lateef, MD - Appointments are:  Medication management: Tuesday and Thursday 8:00 AM to 4:00 PM Procedure day: Monday and Wednesday 7:30 AM to 4:00 PM (Last update: 11/30/2021) ______________________________________________________________________    ____________________________________________________________________________________________  Drug Holidays  What is a "Drug Holiday"? Drug Holiday: is the name given to the process of slowly tapering down and temporarily stopping the pain medication for the purpose of decreasing or eliminating tolerance to the drug.  Benefits Improved effectiveness Decreased required effective dose Improved pain control End dependence on high dose therapy Decrease cost of therapy Uncovering "opioid-induced hyperalgesia". (OIH)  What is "opioid hyperalgesia"? It is a paradoxical increase in pain caused by exposure to opioids. Stopping the opioid pain medication, contrary to the expected, it actually decreases or completely eliminates the pain. Ref.: "A comprehensive review of opioid-induced hyperalgesia". Marion Lee, et.al. Pain Physician. 2011 Mar-Apr;14(2):145-61.  What is tolerance? Tolerance: the progressive loss of effectiveness of a pain medicine due to repetitive use. A common problem of opioid pain medications.  How long should a "Drug  Holiday" last? Effectiveness depends on the patient staying off all opioid pain medicines for a minimum of 14 consecutive days. (2 weeks)  How about just taking less of the medicine? Does not   work. Will not accomplish goal of eliminating the excess receptors.  How about switching to a different pain medicine? (AKA. "Opioid rotation") Does not work. Creates the illusion of effectiveness by taking advantage of inaccurate equivalent dose calculations between different opioids. -This "technique" was promoted by studies funded by pharmaceutical companies, such as [employer], creators of "OxyContin".  Can I stop the medicine "cold turkey"? We do not recommend it. You should always coordinate with your prescribing physician to make the transition as smoothly as possible. Avoid stopping the medicine abruptly without consulting. We recommend a "slow taper".  What is a slow taper? Taper: refers to the gradual decrease in dose.   How do I stop/taper the dose? Slowly. Decrease the daily amount of pills that you take by one (1) pill every seven (7) days. This is called a "slow downward taper". Example: if you normally take four (4) pills per day, drop it to three (3) pills per day for seven (7) days, then to two (2) pills per day for seven (7) days, then to one (1) per day for seven (7) days, and then stop the medicine. The 14 day "Drug Holiday" starts on the first day without medicine.   Will I experience withdrawals? Unlikely with a slow taper.  What triggers withdrawals? Withdrawals are triggered by the sudden/abrupt stop of high dose opioids. Withdrawals can be avoided by slowly decreasing the dose over a prolonged period of time.  What are withdrawals? Symptoms associated with sudden/abrupt reduction/stopping of high-dose, long-term use of pain medication. Withdrawal are seldom seen on low dose therapy, or patients rarely taking opioid medication.  Early Withdrawal Symptoms may  include: Agitation Anxiety Muscle aches Increased tearing Insomnia Runny nose Sweating Yawning  Late symptoms may include: Abdominal cramping Diarrhea Dilated pupils Goose bumps Nausea Vomiting  When could I see withdrawals? Onset: 8-24 hours after last use for most opioids. 12-48 hours for long-acting opioids (i.e.: methadone)  How long could they last? Duration: 4-10 days for most opioids. 14-21 days for long-acting opioids (i.e.: methadone)  What will happen after I complete my "Drug Holiday"? The need and indications for the opioid analgesic will be reviewed before restarting the medication. Dose requirements will likely decrease and the dose will need to be adjusted accordingly.   (Last update: 04/27/2022) ____________________________________________________________________________________________    ____________________________________________________________________________________________  WARNING: CBD (cannabidiol) & Delta (Delta-8 tetrahydrocannabinol) products.   Applicable to:  All individuals currently taking or considering taking CBD (cannabidiol) and, more important, all patients taking opioid analgesic controlled substances (pain medication). (Example: oxycodone; oxymorphone; hydrocodone; hydromorphone; morphine; methadone; tramadol; tapentadol; fentanyl; buprenorphine; butorphanol; dextromethorphan; meperidine; codeine; etc.)  Introduction:  Recently there has been a drive towards the use of "natural" products for the treatment of different conditions, including pain anxiety and sleep disorders. Marijuana and hemp are two varieties of the cannabis genus plants. Marijuana and its derivatives are illegal, while hemp and its derivatives are not. Cannabidiol (CBD) and tetrahydrocannabinol (THC), are two natural compounds found in plants of the Cannabis genus. They can both be extracted from hemp or marijuana. Both compounds interact with your body's endocannabinoid  system in very different ways. CBD is associated with pain relief (analgesia) while THC is associated with the psychoactive effects ("the high") obtained from the use of marijuana products. There are two main types of THC: Delta-9, which comes from the marijuana plant and it is illegal, and Delta-8, which comes from the hemp plant, and it is legal. (Both, Delta-9-THC and Delta-8-THC are psychoactive and   give you "the high".)   Legality:  Marijuana and its derivatives: illegal Hemp and its derivatives: Legal (State dependent) UPDATE: (03/26/2021) The Drug Enforcement Agency (DEA) issued a letter stating that "delta" cannabinoids, including Delta-8-THCO and Delta-9-THCO, synthetically derived from hemp do not qualify as hemp and will be viewed as Schedule I drugs. (Schedule I drugs, substances, or chemicals are defined as drugs with no currently accepted medical use and a high potential for abuse. Some examples of Schedule I drugs are: heroin, lysergic acid diethylamide (LSD), marijuana (cannabis), 3,4-methylenedioxymethamphetamine (ecstasy), methaqualone, and peyote.) (https://www.dea.gov)  Legal status of CBD in [place]:  "Conditionally Legal"  Reference: "FDA Regulation of Cannabis and Cannabis-Derived Products, Including Cannabidiol (CBD)" - https://www.fda.gov/news-events/public-health-focus/fda-regulation-cannabis-and-cannabis-derived-products-including-cannabidiol-cbd  Warning:  CBD is not FDA approved and has not undergo the same manufacturing controls as prescription drugs.  This means that the purity and safety of available CBD may be questionable. Most of the time, despite manufacturer's claims, it is contaminated with THC (delta-9-tetrahydrocannabinol - the chemical in marijuana responsible for the "HIGH").  When this is the case, the THC contaminant will trigger a positive urine drug screen (UDS) test for Marijuana (carboxy-THC).   The FDA recently put out a warning about 5 things that everyone  should be aware of regarding Delta-8 THC: Delta-8 THC products have not been evaluated or approved by the FDA for safe use and may be marketed in ways that put the public health at risk. The FDA has received adverse event reports involving delta-8 THC-containing products. Delta-8 THC has psychoactive and intoxicating effects. Delta-8 THC manufacturing often involve use of potentially harmful chemicals to create the concentrations of delta-8 THC claimed in the marketplace. The final delta-8 THC product may have potentially harmful by-products (contaminants) due to the chemicals used in the process. Manufacturing of delta-8 THC products may occur in uncontrolled or unsanitary settings, which may lead to the presence of unsafe contaminants or other potentially harmful substances. Delta-8 THC products should be kept out of the reach of children and pets.  NOTE: Because a positive UDS for any illicit substance is a violation of our medication agreement, your opioid analgesics (pain medicine) may be permanently discontinued.  MORE ABOUT CBD  General Information: CBD was discovered in 1940 and it is a derivative of the cannabis sativa genus plants (Marijuana and Hemp). It is one of the 113 identified substances found in Marijuana. It accounts for up to 40% of the plant's extract. As of 2018, preliminary clinical studies on CBD included research for the treatment of anxiety, movement disorders, and pain. CBD is available and consumed in multiple forms, including inhalation of smoke or vapor, as an aerosol spray, and by mouth. It may be supplied as an oil containing CBD, capsules, dried cannabis, or as a liquid solution. CBD is thought not to be as psychoactive as THC (delta-9-tetrahydrocannabinol - the chemical in marijuana responsible for the "HIGH"). Studies suggest that CBD may interact with different biological target receptors in the body, including cannabinoid and other neurotransmitter receptors. As of  2018 the mechanism of action for its biological effects has not been determined.  Side-effects  Adverse reactions: Dry mouth, diarrhea, decreased appetite, fatigue, drowsiness, malaise, weakness, sleep disturbances, and others.  Drug interactions:  CBD may interact with medications such as blood-thinners. CBD causes drowsiness on its own and it will increase drowsiness caused by other medications, including antihistamines (such as Benadryl), benzodiazepines (Xanax, Ativan, Valium), antipsychotics, antidepressants, opioids, alcohol and supplements such as kava, melatonin and St. John's Wort.    Other drug interactions: Brivaracetam (Briviact); Caffeine; Carbamazepine (Tegretol); Citalopram (Celexa); Clobazam (Onfi); Eslicarbazepine (Aptiom); Everolimus (Zostress); Lithium; Methadone (Dolophine); Rufinamide (Banzel); Sedative medications (CNS depressants); Sirolimus (Rapamune); Stiripentol (Diacomit); Tacrolimus (Prograf); Tamoxifen ; Soltamox); Topiramate (Topamax); Valproate; Warfarin (Coumadin); Zonisamide. (Last update: 01/17/2022) ____________________________________________________________________________________________   ____________________________________________________________________________________________  Naloxone Nasal Spray  Why am I receiving this medication? [place] STOP ACT requires that all patients taking high dose opioids or at risk of opioids respiratory depression, be prescribed an opioid reversal agent, such as Naloxone (AKA: Narcan).  What is this medication? NALOXONE (nal OX one) treats opioid overdose, which causes slow or shallow breathing, severe drowsiness, or trouble staying awake. Call emergency services after using this medication. You may need additional treatment. Naloxone works by reversing the effects of opioids. It belongs to a group of medications called opioid blockers.  COMMON BRAND NAME(S): Kloxxado, Narcan  What should I tell my care team before  I take this medication? They need to know if you have any of these conditions: Heart disease Substance use disorder An unusual or allergic reaction to naloxone, other medications, foods, dyes, or preservatives Pregnant or trying to get pregnant Breast-feeding  When to use this medication? This medication is to be used for the treatment of respiratory depression (less than 8 breaths per minute) secondary to opioid overdose.   How to use this medication? This medication is for use in the nose. Lay the person on their back. Support their neck with your hand and allow the head to tilt back before giving the medication. The nasal spray should be given into 1 nostril. After giving the medication, move the person onto their side. Do not remove or test the nasal spray until ready to use. Get emergency medical help right away after giving the first dose of this medication, even if the person wakes up. You should be familiar with how to recognize the signs and symptoms of a narcotic overdose. If more doses are needed, give the additional dose in the other nostril. Talk to your care team about the use of this medication in children. While this medication may be prescribed for children as young as newborns for selected conditions, precautions do apply.  Naloxone Overdosage: If you think you have taken too much of this medicine contact a poison control center or emergency room at once.  NOTE: This medicine is only for you. Do not share this medicine with others.  What if I miss a dose? This does not apply.  What may interact with this medication? This is only used during an emergency. No interactions are expected during emergency use. This list may not describe all possible interactions. Give your health care provider a list of all the medicines, herbs, non-prescription drugs, or dietary supplements you use. Also tell them if you smoke, drink alcohol, or use illegal drugs. Some items may interact with  your medicine.  What should I watch for while using this medication? Keep this medication ready for use in the case of an opioid overdose. Make sure that you have the phone number of your care team and local hospital ready. You may need to have additional doses of this medication. Each nasal spray contains a single dose. Some emergencies may require additional doses. After use, bring the treated person to the nearest hospital or call 911. Make sure the treating care team knows that the person has received a dose of this medication. You will receive additional instructions on what to do during and after use of this   medication before an emergency occurs.  What side effects may I notice from receiving this medication? Side effects that you should report to your care team as soon as possible: Allergic reactions--skin rash, itching, hives, swelling of the face, lips, tongue, or throat Side effects that usually do not require medical attention (report these to your care team if they continue or are bothersome): Constipation Dryness or irritation inside the nose Headache Increase in blood pressure Muscle spasms Stuffy nose Toothache This list may not describe all possible side effects. Call your doctor for medical advice about side effects. You may report side effects to FDA at 1-800-FDA-1088.  Where should I keep my medication? Because this is an emergency medication, you should keep it with you at all times.  Keep out of the reach of children and pets. Store between 20 and 25 degrees C (68 and 77 degrees F). Do not freeze. Throw away any unused medication after the expiration date. Keep in original box until ready to use.  NOTE: This sheet is a summary. It may not cover all possible information. If you have questions about this medicine, talk to your doctor, pharmacist, or health care provider.   2023 Elsevier/Gold Standard (2020-10-02  00:00:00)  ____________________________________________________________________________________________   

## 2022-05-24 NOTE — Progress Notes (Unsigned)
PROVIDER NOTE: Information contained herein reflects review and annotations entered in association with encounter. Interpretation of such information and data should be left to medically-trained personnel. Information provided to patient can be located elsewhere in the medical record under "Patient Instructions". Document created using STT-dictation technology, any transcriptional errors that may result from process are unintentional.    Patient: Carolyn Roy  Service Category: E/M  Provider: Oswaldo Done, MD  DOB: 03-18-1965  DOS: 05/25/2022  Referring Provider: Marjie Skiff, NP  MRN: [id number]  Specialty: Interventional Pain Management  PCP: Carolyn Skiff, NP  Type: Established Patient  Setting: Ambulatory outpatient    Location: Office  Delivery: Face-to-face     HPI  [name], a 57 y.o. year old female, is here today because of her No primary diagnosis found.. [name] primary complain today is No chief complaint on file.  Pertinent problems: [name] has Chronic fatigue; Family history of breast cancer; Chemotherapy-induced neuropathy; Malignant neoplasm of lower-outer quadrant of left breast of female, estrogen receptor positive; Chronic feet pain (1ry area of Pain) (Bilateral) (R>L); Neuropathic pain of feet (Bilateral); Chronic knee pain (2ry area of Pain) (Bilateral) (R>L); Chronic hand pain (3ry area of Pain) (Bilateral) (R>L); Chronic pain syndrome; Chronic hip pain (4th area of Pain) (Bilateral) (R>L); Chronic upper extremity pain (3ry area of Pain) (Bilateral) (R>L); Cancer-related pain; Neuropathic pain; Osteoarthritis of knee (Right); Chronic low back pain (Bilateral (L>R) w/o sciatica; Chronic hip pain (Left); Lumbar facet syndrome (Bilateral) (L>R); Idiopathic scoliosis; Chronic sacroiliac joint pain (Left); Chronic pain of knee (Right); Abnormal MRI, lumbar spine (04/20/2018); Lumbar facet arthropathy; Spondylosis without myelopathy or  radiculopathy, lumbosacral region; DDD (degenerative disc disease), lumbosacral; Numbness and tingling of upper extremity (C6/C7 dermatomes) (Right); Personal history of breast cancer; Cervicalgia; Cervical radiculitis (Right); Effusion of knee joint (Right); Tricompartment osteoarthritis of knee (Right); 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 Pain Assessment: Severity of   is reported as a  /10. Location:    / . Onset:  . Quality:  . Timing:  . Modifying factor(s):  Carolyn Roy Kitchen Vitals:  vitals were not taken for this visit.  BMI: Estimated body mass index is 33.2 kg/m as calculated from the following:   Height as of 03/07/22: 5' 4.02" (1.626 m).   Weight as of 03/07/22: 193 lb 8 oz (87.8 kg). Last encounter: 02/21/2022. Last procedure: Visit date not found.  Reason for encounter: medication management. ***  RTCB: 08/28/2022   Pharmacotherapy Assessment  Analgesic: Oxycodone IR 5 mg, 1 tab PO BID (10 mg/day of oxycodone) (Average: 2 tabs/day = 10 mg/day of oxycodone) MME/day: 15 mg/day.   Monitoring: [place] PMP: PDMP reviewed during this encounter.       Pharmacotherapy: No side-effects or adverse reactions reported. Compliance: No problems identified. Effectiveness: Clinically acceptable.  No notes on file  No results found for: "CBDTHCR" No results found for: "D8THCCBX" No results found for: "D9THCCBX"  UDS:  Summary  Date Value Ref Range Status  08/31/2021 Note  Final    Comment:    ==================================================================== [facility] 13 (MW) ==================================================================== Test                             Result       Flag       Units  Drug Present and  Declared for Prescription Verification   Oxycodone                      1261         EXPECTED   ng/mg creat   Oxymorphone                     662          EXPECTED   ng/mg creat   Noroxycodone                   2337         EXPECTED   ng/mg creat   Noroxymorphone                 157          EXPECTED   ng/mg creat    Sources of oxycodone are scheduled prescription medications.    Oxymorphone, noroxycodone, and noroxymorphone are expected    metabolites of oxycodone. Oxymorphone is also available as a    scheduled prescription medication.  ==================================================================== Test                      Result    Flag   Units      Ref Range   Creatinine              112              mg/dL      >=09 ==================================================================== Declared Medications:  The flagging and interpretation on this report are based on the  following declared medications.  Unexpected results may arise from  inaccuracies in the declared medications.   **Note: The testing scope of this panel includes these medications:   Oxycodone (OxyIR)   **Note: The testing scope of this panel does not include the  following reported medications:   Acetaminophen (Excedrin)  Amitriptyline (Elavil)  Aspirin (Excedrin)  Caffeine (Excedrin)  Duloxetine (Cymbalta)  Famotidine (Pepcid)  Goserelin  Insulin (Basaglar)  Linagliptin (Tradjenta)  Metformin (Glucophage)  Potassium (Klor-Con)  Pregabalin (Lyrica)  Rosuvastatin (Crestor)  Sumatriptan (Imitrex)  Tamoxifen (Nolvadex)  Vitamin D2 (Drisdol) ==================================================================== For clinical consultation, please call [phone]. ====================================================================       ROS  Constitutional: Denies any fever or chills Gastrointestinal: No reported hemesis, hematochezia, vomiting, or acute GI distress Musculoskeletal: Denies any acute onset joint swelling, redness, loss of ROM, or weakness Neurological: No reported episodes of acute onset apraxia, aphasia,  dysarthria, agnosia, amnesia, paralysis, loss of coordination, or loss of consciousness  Medication Review  Basaglar KwikPen, DULoxetine, Insulin Pen Needle, OneTouch Verio, SUMAtriptan, Vitamin D (Ergocalciferol), amitriptyline, aspirin-acetaminophen-caffeine, famotidine, glucose blood, goserelin, lamoTRIgine, linagliptin, metFORMIN, naloxone, oxyCODONE, pregabalin, rosuvastatin, and tamoxifen  History Review  Allergy: Ms. Layfield is allergic to aspirin. Drug: Ms. Mallari  reports no history of drug use. Alcohol:  reports no history of alcohol use. Tobacco:  reports that she has been smoking cigarettes. She has a 11.00 pack-year smoking history. She has quit using smokeless tobacco.  Her smokeless tobacco use included snuff. Social: Ms. Cabell  reports that she has been smoking cigarettes. She has a 11.00 pack-year smoking history. She has quit using smokeless tobacco.  Her smokeless tobacco use included snuff. She reports that she does not drink alcohol and does not use drugs. Medical:  has a past medical history of Allergy, Breast cancer (HCC), Cancer (HCC) (06/15/2017), Depression, Diabetes mellitus without complication (HCC) (2017), Edema of left  upper extremity, Endometriosis, Family history of breast cancer, Headache, Hyperlipidemia, Lymphedema of left arm, Ovarian mass, Personal history of chemotherapy, Personal history of radiation therapy, and Pneumonia. Surgical: Ms. Tempesta  has a past surgical history that includes Oophorectomy; Tubal ligation; Portacath placement (Right, 07/14/2017); Axillary lymph node biopsy (Left, 07/14/2017); Breast lumpectomy (Left, 01/12/2018); Breast biopsy (Left); Partial mastectomy with needle localization (Left, 01/12/2018); Sentinel node biopsy (Left, 01/12/2018); Colonoscopy with propofol (N/A, 12/27/2019); and Colonoscopy with propofol (N/A, 01/05/2022). Family: family history includes Breast cancer in an other family member; Breast cancer (age of onset: 47) in her  maternal grandmother; Breast cancer (age of onset: 57) in an other family member; Breast cancer (age of onset: 39) in her maternal aunt; Cancer in her mother; Colon cancer in her maternal grandmother and mother; Diabetes in her brother; Other in her father; Pancreatitis in her brother; Prostate cancer (age of onset: 61) in her brother.  Laboratory Chemistry Profile   Renal Lab Results  Component Value Date   BUN 16 03/01/2022   CREATININE 1.22 (H) 03/01/2022   BCR 10 12/03/2021   GFRAA >60 10/21/2019   GFRNONAA 53 (L) 03/01/2022    Hepatic Lab Results  Component Value Date   AST 21 03/01/2022   ALT 14 03/01/2022   ALBUMIN 3.8 03/01/2022   ALKPHOS 77 03/01/2022   LIPASE 31 12/01/2020    Electrolytes Lab Results  Component Value Date   NA 136 03/01/2022   K 3.6 03/01/2022   CL 100 03/01/2022   CALCIUM 9.1 03/01/2022   MG 2.3 03/07/2022    Bone Lab Results  Component Value Date   VD25OH 76.3 12/03/2021    Inflammation (CRP: Acute Phase) (ESR: Chronic Phase) Lab Results  Component Value Date   CRP <1 12/21/2020   ESRSEDRATE 3 12/21/2020         Note: Above Lab results reviewed.  Recent Imaging Review  ECHOCARDIOGRAM COMPLETE    ECHOCARDIOGRAM REPORT       Patient Name:   Jeronica A WHITE Date of Exam: 09/15/2021 Medical Rec #:  [id number]         Height:       64.0 in Accession #:    [id number]        Weight:       177.0 lb Date of Birth:  01-04-1966         BSA:          1.857 m Patient Age:    56 years          BP:           104/71 mmHg Patient Gender: F                 HR:           82 bpm. Exam Location:  [address]  Procedure: 2D Echo, Cardiac Doppler, Color Doppler, 3D Echo and Strain Analysis  Indications:    Z85.3 Breast cancer   History:        Patient has prior history of Echocardiogram examinations, most                 recent 06/28/2017. Breast cancer, Signs/Symptoms:H/o chemoth;                 Risk Factors:Current Smoker, Dyslipidemia and  Diabetes.   Sonographer:    Samule Ohm RDCS Referring Phys: [id number] HEATHER E [place]  IMPRESSIONS   1. Left ventricular ejection fraction, by estimation, is 55 to 60%. The  left ventricle has normal function. The left ventricle has no regional wall motion abnormalities. Left ventricular diastolic parameters are consistent with Grade I diastolic  dysfunction (impaired relaxation). The average left ventricular global longitudinal strain is -16.7 %. The global longitudinal strain is normal. GLS underestimated due to poor endocardial tracking.  2. Right ventricular systolic function is normal. The right ventricular size is normal.  3. Left atrial size was mild to moderately dilated.  4. The mitral valve is normal in structure. Trivial mitral valve regurgitation. No evidence of mitral stenosis.  5. The aortic valve is tricuspid. There is mild calcification of the aortic valve. Aortic valve regurgitation is not visualized. Aortic valve sclerosis/calcification is present, without any evidence of aortic stenosis.  6. The inferior vena cava is normal in size with greater than 50% respiratory variability, suggesting right atrial pressure of 3 mmHg.  FINDINGS  Left Ventricle: Left ventricular ejection fraction, by estimation, is 55 to 60%. The left ventricle has normal function. The left ventricle has no regional wall motion abnormalities. The average left ventricular global longitudinal strain is -16.7 %.  The global longitudinal strain is normal. The left ventricular internal cavity size was normal in size. There is no left ventricular hypertrophy. Left ventricular diastolic parameters are consistent with Grade I diastolic dysfunction (impaired  relaxation).  Right Ventricle: The right ventricular size is normal. No increase in right ventricular wall thickness. Right ventricular systolic function is normal.  Left Atrium: Left atrial size was mild to moderately dilated.  Right Atrium: Right  atrial size was normal in size.  Pericardium: There is no evidence of pericardial effusion.  Mitral Valve: The mitral valve is normal in structure. Trivial mitral valve regurgitation. No evidence of mitral valve stenosis.  Tricuspid Valve: The tricuspid valve is normal in structure. Tricuspid valve regurgitation is trivial. No evidence of tricuspid stenosis.  Aortic Valve: The aortic valve is tricuspid. There is mild calcification of the aortic valve. Aortic valve regurgitation is not visualized. Aortic valve sclerosis/calcification is present, without any evidence of aortic stenosis.  Pulmonic Valve: The pulmonic valve was normal in structure. Pulmonic valve regurgitation is not visualized. No evidence of pulmonic stenosis.  Aorta: The aortic root is normal in size and structure.  Venous: The inferior vena cava is normal in size with greater than 50% respiratory variability, suggesting right atrial pressure of 3 mmHg.  IAS/Shunts: No atrial level shunt detected by color flow Doppler.    LEFT VENTRICLE PLAX 2D LVIDd:         4.80 cm   Diastology LVIDs:         3.40 cm   LV e' medial:    12.30 cm/s LV PW:         0.90 cm   LV E/e' medial:  5.8 LV IVS:        0.90 cm   LV e' lateral:   14.30 cm/s LVOT diam:     2.00 cm   LV E/e' lateral: 5.0 LV SV:         55 LV SV Index:   30        2D Longitudinal Strain LVOT Area:     3.14 cm  2D Strain GLS Avg:     -16.7 %                            3D Volume EF:  3D EF:        58 %                          LV EDV:       126 ml                          LV ESV:       53 ml                          LV SV:        73 ml  RIGHT VENTRICLE             IVC RV S prime:     14.10 cm/s  IVC diam: 1.30 cm TAPSE (M-mode): 1.5 cm RVSP:           21.5 mmHg  LEFT ATRIUM             Index        RIGHT ATRIUM           Index LA diam:        4.20 cm 2.26 cm/m   RA Pressure: 3.00 mmHg LA Vol (A2C):   78.1 ml 42.05 ml/m  RA Area:      13.30 cm LA Vol (A4C):   60.1 ml 32.36 ml/m  RA Volume:   32.70 ml  17.61 ml/m LA Biplane Vol: 69.3 ml 37.31 ml/m  AORTIC VALVE LVOT Vmax:   81.40 cm/s LVOT Vmean:  54.900 cm/s LVOT VTI:    0.175 m   AORTA Ao Root diam: 3.30 cm Ao Asc diam:  3.50 cm  MITRAL VALVE               TRICUSPID VALVE MV Area (PHT): 2.28 cm    TR Peak grad:   18.5 mmHg MV Decel Time: 333 msec    TR Vmax:        215.00 cm/s MV E velocity: 71.30 cm/s  Estimated RAP:  3.00 mmHg MV A velocity: 61.30 cm/s  RVSP:           21.5 mmHg MV E/A ratio:  1.16                            SHUNTS                            Systemic VTI:  0.18 m                            Systemic Diam: 2.00 cm  Arvilla Meres MD Electronically signed by Arvilla Meres MD Signature Date/Time: 09/15/2021/5:10:06 PM      Final   Note: Reviewed        Physical Exam  General appearance: Well nourished, well developed, and well hydrated. In no apparent acute distress Mental status: Alert, oriented x 3 (person, place, & time)       Respiratory: No evidence of acute respiratory distress Eyes: PERLA Vitals: LMP  (LMP Unknown) Comment: LAST PERIOD IN MAY 2019 WHEN SHE STARTED CHEMO BMI: Estimated body mass index is 33.2 kg/m as calculated from the following:   Height as of 03/07/22: 5' 4.02" (1.626 m).   Weight as of 03/07/22: 193 lb 8 oz (87.8  kg). Ideal: Patient weight not recorded  Assessment   Diagnosis Status  1. Cervicalgia   2. Chronic pain syndrome   3. Pharmacologic therapy   4. DDD (degenerative disc disease), lumbosacral   5. Cancer-related pain   6. Abnormal MRI, lumbar spine (04/20/2018)   7. Encounter for medication management   8. Encounter for chronic pain management   9. Chronic use of opiate for therapeutic purpose   10. Chronic hand pain (3ry area of Pain) (Bilateral) (R>L)   11. Lumbar facet syndrome (Bilateral) (L>R)   12. Chronic knee pain (2ry area of Pain) (Bilateral) (R>L)   13. Chronic low back  pain (Bilateral (L>R) w/o sciatica   14. Arthropathy of spinal facet joint concurrent with and due to effusion (L3-4, L4-5)   15. Chronic hip pain (4th area of Pain) (Bilateral) (R>L)   16. Chronic feet pain (1ry area of Pain) (Bilateral) (R>L)   17. 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  Plan of Care  Problem-specific:  No problem-specific Assessment & Plan notes found for this encounter.  Ms. EPHRATA VERVILLE has a current medication list which includes the following long-term medication(s): amitriptyline, famotidine, basaglar kwikpen, lamotrigine, linagliptin, metformin, oxycodone, oxycodone, pregabalin, rosuvastatin, and sumatriptan.  Pharmacotherapy (Medications Ordered): No orders of the defined types were placed in this encounter.  Orders:  No orders of the defined types were placed in this encounter.  Follow-up plan:   No follow-ups on file.      Interventional Therapies  Risk  Complexity Considerations:   Estimated body mass index is 30.55 kg/m as calculated from the following:   Height as of this encounter: 5\' 4"  (1.626 m).   Weight as of this encounter: 178 lb (80.7 kg). WNL   Planned  Pending:      Under consideration:   Possible bilateral lumbar facet RFA  Diagnostic lumbar sympathetic Blk  Diagnostic right genicular NB  Possible right genicular nerve RFA    Completed:   Therapeutic right IA steroid knee inj. x1 (08/18/2020) (100/100/100 x 24 hours/80-85)  Diagnostic bilateral lumbar facet Blk x2 (08/16/18) (100/100/80/90-100)  Therapeutic/palliative right knee Hyalgan inj. x5 (06/25/2019) (100/100/75 x 1 week/70)    Therapeutic  Palliative (PRN) options:   Therapeutic right IA steroid knee inj.  Diagnostic bilateral lumbar facet MBB   Therapeutic/palliative right Hyalgan knee inj.     Pharmacotherapy  Nonopioids transfer 02/03/2020: Pregabalin  (Lyrica) and alpha-lipoic acid        Recent Visits No visits were found meeting these conditions. Showing recent visits within past 90 days and meeting all other requirements Future Appointments Date Type Provider Dept  05/25/22 Appointment Delano Metz, MD [facility]  Showing future appointments within next 90 days and meeting all other requirements  I discussed the assessment and treatment plan with the patient. The patient was provided an opportunity to ask questions and all were answered. The patient agreed with the plan and demonstrated an understanding of the instructions.  Patient advised to call back or seek an in-person evaluation if the symptoms or condition worsens.  Duration of encounter: *** minutes.  Total time on encounter, as per AMA guidelines included both the face-to-face and non-face-to-face time personally spent by the physician and/or other qualified health care professional(s) on the day of the encounter (includes time in activities that require the physician or other qualified health care professional and does not include time  in activities normally performed by clinical staff). Physician's time may include the following activities when performed: Preparing to see the patient (e.g., pre-charting review of records, searching for previously ordered imaging, lab work, and nerve conduction tests) Review of prior analgesic pharmacotherapies. Reviewing PMP Interpreting ordered tests (e.g., lab work, imaging, nerve conduction tests) Performing post-procedure evaluations, including interpretation of diagnostic procedures Obtaining and/or reviewing separately obtained history Performing a medically appropriate examination and/or evaluation Counseling and educating the patient/family/caregiver Ordering medications, tests, or procedures Referring and communicating with other health care professionals (when not separately reported) Documenting clinical  information in the electronic or other health record Independently interpreting results (not separately reported) and communicating results to the patient/ family/caregiver Care coordination (not separately reported)  Note by: Carolyn Done, MD Date: 05/25/2022; Time: 7:54 AM

## 2022-05-25 ENCOUNTER — Encounter: Payer: Self-pay | Admitting: Pain Medicine

## 2022-05-25 ENCOUNTER — Ambulatory Visit: Payer: BLUE CROSS/BLUE SHIELD | Attending: Pain Medicine | Admitting: Pain Medicine

## 2022-05-25 DIAGNOSIS — Z79899 Other long term (current) drug therapy: Secondary | ICD-10-CM | POA: Diagnosis not present

## 2022-05-25 DIAGNOSIS — M4316 Spondylolisthesis, lumbar region: Secondary | ICD-10-CM

## 2022-05-25 DIAGNOSIS — M79672 Pain in left foot: Secondary | ICD-10-CM | POA: Diagnosis present

## 2022-05-25 DIAGNOSIS — G8929 Other chronic pain: Secondary | ICD-10-CM | POA: Diagnosis not present

## 2022-05-25 DIAGNOSIS — M2540 Effusion, unspecified joint: Secondary | ICD-10-CM | POA: Insufficient documentation

## 2022-05-25 DIAGNOSIS — M25551 Pain in right hip: Secondary | ICD-10-CM | POA: Diagnosis present

## 2022-05-25 DIAGNOSIS — M79642 Pain in left hand: Secondary | ICD-10-CM

## 2022-05-25 DIAGNOSIS — M545 Low back pain, unspecified: Secondary | ICD-10-CM | POA: Insufficient documentation

## 2022-05-25 DIAGNOSIS — M79641 Pain in right hand: Secondary | ICD-10-CM | POA: Diagnosis not present

## 2022-05-25 DIAGNOSIS — M47816 Spondylosis without myelopathy or radiculopathy, lumbar region: Secondary | ICD-10-CM

## 2022-05-25 DIAGNOSIS — M542 Cervicalgia: Secondary | ICD-10-CM | POA: Diagnosis not present

## 2022-05-25 DIAGNOSIS — M5137 Other intervertebral disc degeneration, lumbosacral region: Secondary | ICD-10-CM | POA: Diagnosis not present

## 2022-05-25 DIAGNOSIS — G894 Chronic pain syndrome: Secondary | ICD-10-CM | POA: Diagnosis not present

## 2022-05-25 DIAGNOSIS — R937 Abnormal findings on diagnostic imaging of other parts of musculoskeletal system: Secondary | ICD-10-CM | POA: Diagnosis not present

## 2022-05-25 DIAGNOSIS — G893 Neoplasm related pain (acute) (chronic): Secondary | ICD-10-CM | POA: Diagnosis not present

## 2022-05-25 DIAGNOSIS — M79671 Pain in right foot: Secondary | ICD-10-CM | POA: Diagnosis present

## 2022-05-25 DIAGNOSIS — Z79891 Long term (current) use of opiate analgesic: Secondary | ICD-10-CM | POA: Diagnosis not present

## 2022-05-25 DIAGNOSIS — M47819 Spondylosis without myelopathy or radiculopathy, site unspecified: Secondary | ICD-10-CM

## 2022-05-25 DIAGNOSIS — M25562 Pain in left knee: Secondary | ICD-10-CM | POA: Insufficient documentation

## 2022-05-25 DIAGNOSIS — M25552 Pain in left hip: Secondary | ICD-10-CM | POA: Insufficient documentation

## 2022-05-25 DIAGNOSIS — M25561 Pain in right knee: Secondary | ICD-10-CM | POA: Insufficient documentation

## 2022-05-25 DIAGNOSIS — M51379 Other intervertebral disc degeneration, lumbosacral region without mention of lumbar back pain or lower extremity pain: Secondary | ICD-10-CM

## 2022-05-25 MED ORDER — OXYCODONE HCL 5 MG PO TABS
5.0000 mg | ORAL_TABLET | Freq: Two times a day (BID) | ORAL | 0 refills | Status: DC | PRN
Start: 2022-06-29 — End: 2022-08-21

## 2022-05-25 MED ORDER — OXYCODONE HCL 5 MG PO TABS
5.0000 mg | ORAL_TABLET | Freq: Two times a day (BID) | ORAL | 0 refills | Status: DC | PRN
Start: 2022-07-29 — End: 2022-08-21

## 2022-05-25 MED ORDER — OXYCODONE HCL 5 MG PO TABS
5.0000 mg | ORAL_TABLET | Freq: Two times a day (BID) | ORAL | 0 refills | Status: DC | PRN
Start: 2022-05-30 — End: 2022-08-21

## 2022-05-25 NOTE — Progress Notes (Signed)
Safety precautions to be maintained throughout the outpatient stay will include: orient to surroundings, keep bed in low position, maintain call bell within reach at all times, provide assistance with transfer out of bed and ambulation.   Nursing Pain Medication Assessment:  Safety precautions to be maintained throughout the outpatient stay will include: orient to surroundings, keep bed in low position, maintain call bell within reach at all times, provide assistance with transfer out of bed and ambulation.  Medication Inspection Compliance: Pill count conducted under aseptic conditions, in front of the patient. Neither the pills nor the bottle was removed from the patient's sight at any time. Once count was completed pills were immediately returned to the patient in their original bottle.  Medication: Oxycodone IR Pill/Patch Count:  5 of 60 pills remain Pill/Patch Appearance: Markings consistent with prescribed medication Bottle Appearance: Standard pharmacy container. Clearly labeled. Filled Date: 03 / 18 / 2024 Last Medication intake:  Today

## 2022-05-29 ENCOUNTER — Other Ambulatory Visit: Payer: Self-pay | Admitting: Nurse Practitioner

## 2022-05-30 ENCOUNTER — Encounter: Payer: Self-pay | Admitting: Nurse Practitioner

## 2022-05-30 MED ORDER — ONDANSETRON HCL 8 MG PO TABS: 8.0000 mg | ORAL_TABLET | Freq: Two times a day (BID) | ORAL | 4 refills | Status: AC | PRN

## 2022-05-30 NOTE — Telephone Encounter (Signed)
Requested Prescriptions  Pending Prescriptions Disp Refills   metFORMIN (GLUCOPHAGE-XR) 500 MG 24 hr tablet [Pharmacy Med Name: METFORMIN HCL ER 500 MG TABLET] 360 tablet 4    Sig: START BY TAKING ONE TABLET (500 MG) TWICE A DAY WITH MEALS AND THEN INCREASE IN ONE WEEK TO TWO TABLETS (1000 MG) TWICE A DAY WITH MEALS.     Endocrinology:  Diabetes - Biguanides Failed - 05/29/2022 10:57 AM      Failed - Cr in normal range and within 360 days    Creatinine, Ser  Date Value Ref Range Status  03/01/2022 1.22 (H) 0.44 - 1.00 mg/dL Final         Failed - HBA1C is between 0 and 7.9 and within 180 days    HB A1C (BAYER DCA - WAIVED)  Date Value Ref Range Status  03/07/2022 8.4 (H) 4.8 - 5.6 % Final    Comment:             Prediabetes: 5.7 - 6.4          Diabetes: >6.4          Glycemic control for adults with diabetes: <7.0          Failed - eGFR in normal range and within 360 days    GFR calc Af Amer  Date Value Ref Range Status  10/21/2019 >60 >60 mL/min Final   GFR, Estimated  Date Value Ref Range Status  03/01/2022 53 (L) >60 mL/min Final    Comment:    (NOTE) Calculated using the CKD-EPI Creatinine Equation (2021)    eGFR  Date Value Ref Range Status  12/03/2021 65 >59 mL/min/1.73 Final         Failed - B12 Level in normal range and within 720 days    Vitamin B-12  Date Value Ref Range Status  10/18/2019 424 232 - 1,245 pg/mL Final         Passed - Valid encounter within last 6 months    Recent Outpatient Visits           2 months ago Type 2 diabetes mellitus with hyperglycemia, with long-term current use of insulin (HCC)   [place] [facility] [place], [place] T, NP   5 months ago Type 2 diabetes mellitus with hyperglycemia, with long-term current use of insulin (HCC)   [place] [place], [place] T, NP   9 months ago Type 2 diabetes mellitus with hyperglycemia, with long-term current use of insulin (HCC)   [place]  [facility] [place], [place] T, NP   10 months ago Type 2 diabetes mellitus with hyperglycemia, with long-term current use of insulin (HCC)   [place] [place], [place] T, NP   1 year ago Type 2 diabetes mellitus with hyperglycemia, with long-term current use of insulin (HCC)   [place] [facility] [place], Corrie Dandy T, NP       Future Appointments             In 1 week [place], Corrie Dandy T, NP [facility] [facility]            Passed - CBC within normal limits and completed in the last 12 months    WBC  Date Value Ref Range Status  03/01/2022 7.1 4.0 - 10.5 K/uL Final   RBC  Date Value Ref Range Status  03/01/2022 3.89 3.87 - 5.11 MIL/uL Final   Hemoglobin  Date Value Ref Range  Status  03/01/2022 12.3 12.0 - 15.0 g/dL Final  78/46/9629 52.8 11.1 - 15.9 g/dL Final   HCT  Date Value Ref Range Status  03/01/2022 36.6 36.0 - 46.0 % Final   Hematocrit  Date Value Ref Range Status  12/21/2020 44.8 34.0 - 46.6 % Final   MCHC  Date Value Ref Range Status  03/01/2022 33.6 30.0 - 36.0 g/dL Final   [facility]  Date Value Ref Range Status  03/01/2022 31.6 26.0 - 34.0 pg Final   MCV  Date Value Ref Range Status  03/01/2022 94.1 80.0 - 100.0 fL Final  12/21/2020 96 79 - 97 fL Final   No results found for: "PLTCOUNTKUC", "LABPLAT", "POCPLA" RDW  Date Value Ref Range Status  03/01/2022 12.8 11.5 - 15.5 % Final  12/21/2020 11.7 11.7 - 15.4 % Final

## 2022-05-31 ENCOUNTER — Encounter: Payer: Self-pay | Admitting: Internal Medicine

## 2022-05-31 ENCOUNTER — Inpatient Hospital Stay: Payer: BLUE CROSS/BLUE SHIELD

## 2022-05-31 ENCOUNTER — Inpatient Hospital Stay: Payer: BLUE CROSS/BLUE SHIELD | Attending: Internal Medicine

## 2022-05-31 ENCOUNTER — Encounter: Payer: Self-pay | Admitting: *Deleted

## 2022-05-31 ENCOUNTER — Inpatient Hospital Stay (HOSPITAL_BASED_OUTPATIENT_CLINIC_OR_DEPARTMENT_OTHER): Payer: BLUE CROSS/BLUE SHIELD | Admitting: Internal Medicine

## 2022-05-31 VITALS — BP 123/74 | HR 96 | Temp 97.4°F | Ht 64.0 in | Wt 199.6 lb

## 2022-05-31 DIAGNOSIS — F17210 Nicotine dependence, cigarettes, uncomplicated: Secondary | ICD-10-CM | POA: Insufficient documentation

## 2022-05-31 DIAGNOSIS — Z170 Estrogen receptor positive status [ER+]: Secondary | ICD-10-CM

## 2022-05-31 DIAGNOSIS — Z79899 Other long term (current) drug therapy: Secondary | ICD-10-CM | POA: Diagnosis not present

## 2022-05-31 DIAGNOSIS — Z5111 Encounter for antineoplastic chemotherapy: Secondary | ICD-10-CM | POA: Insufficient documentation

## 2022-05-31 DIAGNOSIS — C50512 Malignant neoplasm of lower-outer quadrant of left female breast: Secondary | ICD-10-CM | POA: Insufficient documentation

## 2022-05-31 LAB — COMPREHENSIVE METABOLIC PANEL
ALT: 13 U/L (ref 0–44)
AST: 17 U/L (ref 15–41)
Albumin: 3.6 g/dL (ref 3.5–5.0)
Alkaline Phosphatase: 76 U/L (ref 38–126)
Anion gap: 9 (ref 5–15)
BUN: 13 mg/dL (ref 6–20)
CO2: 25 mmol/L (ref 22–32)
Calcium: 9.1 mg/dL (ref 8.9–10.3)
Chloride: 100 mmol/L (ref 98–111)
Creatinine, Ser: 0.92 mg/dL (ref 0.44–1.00)
GFR, Estimated: >60 mL/min (ref >60)
Glucose, Bld: 429 mg/dL — ABNORMAL HIGH (ref 70–99)
Potassium: 3.8 mmol/L (ref 3.5–5.1)
Sodium: 134 mmol/L — ABNORMAL LOW (ref 135–145)
Total Bilirubin: 0.3 mg/dL (ref 0.3–1.2)
Total Protein: 6.8 g/dL (ref 6.5–8.1)

## 2022-05-31 LAB — CBC WITH DIFFERENTIAL/PLATELET
Abs Immature Granulocytes: 0.02 10*3/uL (ref 0.00–0.07)
Basophils Absolute: 0 10*3/uL (ref 0.0–0.1)
Basophils Relative: 1 %
Eosinophils Absolute: 0 10*3/uL (ref 0.0–0.5)
Eosinophils Relative: 0 %
HCT: 38.4 % (ref 36.0–46.0)
Hemoglobin: 12.8 g/dL (ref 12.0–15.0)
Immature Granulocytes: 0 %
Lymphocytes Relative: 25 %
Lymphs Abs: 1.7 10*3/uL (ref 0.7–4.0)
MCH: 31.6 pg (ref 26.0–34.0)
MCHC: 33.3 g/dL (ref 30.0–36.0)
MCV: 94.8 fL (ref 80.0–100.0)
Monocytes Absolute: 0.4 10*3/uL (ref 0.1–1.0)
Monocytes Relative: 5 %
Neutro Abs: 4.7 10*3/uL (ref 1.7–7.7)
Neutrophils Relative %: 69 %
Platelets: 197 10*3/uL (ref 150–400)
RBC: 4.05 MIL/uL (ref 3.87–5.11)
RDW: 13.2 % (ref 11.5–15.5)
WBC: 6.9 10*3/uL (ref 4.0–10.5)
nRBC: 0 % (ref 0.0–0.2)

## 2022-05-31 MED ORDER — GOSERELIN ACETATE 10.8 MG ~~LOC~~ IMPL
10.8000 mg | DRUG_IMPLANT | Freq: Once | SUBCUTANEOUS | Status: AC
  Administered 2022-05-31: 10.8 mg via SUBCUTANEOUS
  Filled 2022-05-31: qty 10.8

## 2022-05-31 MED ORDER — SODIUM CHLORIDE 0.9% FLUSH
10.0000 mL | INTRAVENOUS | Status: DC | PRN
  Administered 2022-05-31: 10 mL via INTRAVENOUS
  Filled 2022-05-31: qty 10

## 2022-05-31 MED ORDER — HEPARIN SOD (PORK) LOCK FLUSH 100 UNIT/ML IV SOLN
500.0000 [IU] | Freq: Once | INTRAVENOUS | Status: AC
  Administered 2022-05-31: 500 [IU] via INTRAVENOUS
  Filled 2022-05-31: qty 5

## 2022-05-31 NOTE — Progress Notes (Signed)
[facility] CONSULT NOTE  Patient Care Team: Marjie Skiff, NP as PCP - General (Nurse Practitioner) Meriam Sprague, MD as PCP - Cardiology (Cardiology) [name], Merrily Pew, MD (General Surgery) Benita Gutter, RN as Registered Nurse Sonia Side, Philbert Riser, [name] (Obstetrics and Gynecology) Earna Coder, MD as Consulting Physician (Oncology) Lyndle Herrlich, MD as Consulting Physician (Orthopedic Surgery)  CHIEF COMPLAINTS/PURPOSE OF CONSULTATION: Breast cancer  Oncology History Overview Note  # MAY 2019-  clinical stage IIIA (T3N1Mx) left breast cancer s/p biopsy on 06/14/2017. -Pathology revealed grade III invasive ductal carcinoma. -Axillary FNA revealed malignant cells c/w metastatic carcinoma. Tumor was ER + (90%), PR + (30%), Her2/neu - and Ki67 70%.  CA27.29 was 7.8 on 06/14/2017.  # She received 4 cycles of AC with Neulasta support (07/20/2017 - 08/31/2017).;  neoadjuvant Taxol on 09/14/2017.  #DEC 2019- Lumpectomy/sentinel lymph node biopsy [Dr.[name]]-complete pathologic response  # s/p RT [delayed sec to wound infection; Dr.[name]] finished RT [4/12]  # April 14th 2020- START TAM; stopped in mid-May secondary intolerance [severe migraines].  # 18th May 2020-start Arimidex [hormonal profile-postmenopausal;add Zoladex q3M]; STOPPED sec intolerance/joint pain; NOV 2021- STARTED AROMASIN; Stopped x 2 months sec to extreme fatigue/severe joint pains.   # July 2022- START tamoxifen 10 mg a day.  # PN-2 sec to taxol Ottis Stain management/ # may 2019- Endometrial sampling [Dr. Secord/Berchuck]-negative for malignancy/ # DM-2- poorly controlled.   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   # PAP SMEAR- RECOMMENDED 2022- summer  -------------------------------------------  DIAGNOSIS: left breast cancer  STAGE:  III       ;GOALS: cure  CURRENT/MOST RECENT THERAPY Tam     Cancer of midline of breast, left (Resolved)  06/15/2017 Initial Diagnosis   Cancer of midline of breast, left (HCC)   07/20/2017 - 11/16/2017 Chemotherapy   The patient had dexamethasone (DECADRON) 4 MG tablet, 1 of 1 cycle, Start date: --, End date: -- DOXOrubicin (ADRIAMYCIN) chemo injection 122 mg, 60 mg/m2 = 122 mg, Intravenous,  Once, 4 of 4 cycles Administration: 122 mg (07/20/2017), 122 mg (08/03/2017), 122 mg (08/17/2017), 122 mg (08/31/2017) palonosetron (ALOXI) injection 0.25 mg, 0.25 mg, Intravenous,  Once, 4 of 4 cycles Administration: 0.25 mg (07/20/2017), 0.25 mg (08/03/2017), 0.25 mg (08/17/2017), 0.25 mg (08/31/2017) pegfilgrastim (NEULASTA) injection 6 mg, 6 mg, Subcutaneous, Once, 5 of 5 cycles Administration: 6 mg (07/21/2017), 6 mg (08/04/2017), 6 mg (08/18/2017), 6 mg (09/01/2017) cyclophosphamide (CYTOXAN) 1,220 mg in sodium chloride 0.9 % 250 mL chemo infusion, 600 mg/m2 = 1,220 mg, Intravenous,  Once, 4 of 4 cycles Administration: 1,220 mg (07/20/2017), 1,220 mg (08/03/2017), 1,220 mg (08/17/2017), 1,220 mg (08/31/2017) PACLitaxel (TAXOL) 162 mg in sodium chloride 0.9 % 250 mL chemo infusion (</= 80mg /m2), 80 mg/m2 = 162 mg, Intravenous,  Once, 10 of 12 cycles Dose modification: 65 mg/m2 (original dose 80 mg/m2, Cycle 13, Reason: Provider Judgment, Comment: neuropathy) Administration: 162 mg (09/14/2017), 162 mg (09/21/2017), 162 mg (09/28/2017), 162 mg (10/05/2017), 162 mg (10/12/2017), 162 mg (10/19/2017), 162 mg (10/26/2017), 132 mg (11/02/2017), 132 mg (11/09/2017), 132 mg (11/16/2017) fosaprepitant (EMEND) 150 mg, dexamethasone (DECADRON) 12 mg in sodium chloride 0.9 % 145 mL IVPB, , Intravenous,  Once, 4 of 4 cycles Administration:  (07/20/2017),  (08/03/2017),  (08/17/2017),  (08/31/2017)  for chemotherapy treatment.    Malignant neoplasm of lower-outer quadrant of left breast of female, estrogen receptor positive  11/08/2017 Initial Diagnosis  Malignant neoplasm of lower-outer quadrant of  left breast of female, estrogen receptor positive (HCC)    HISTORY OF PRESENTING ILLNESS: Ambulating independently.  Alone.  LEVAEH VICE 57 y.o.  female with a history of stage III ER PR positive HER-2/neu negative breast cancer currently on tamoxifen 10 mg a day [intolerance] plus Zoladexq3 /Zometa every 6 months is here for follow-up.  Complains of hot flashes.   Patient denies any new lumps or bumps.  Appetite is good.  No weight loss. Patient has been started on amitriptyline; by neurology.  She continues to be on Cymbalta.    Review of Systems  Constitutional:  Positive for malaise/fatigue. Negative for chills, diaphoresis, fever and weight loss.  HENT:  Negative for nosebleeds and sore throat.   Eyes:  Negative for double vision.  Respiratory:  Negative for cough, hemoptysis, sputum production, shortness of breath and wheezing.   Cardiovascular:  Negative for chest pain, palpitations, orthopnea and leg swelling.  Gastrointestinal:  Negative for abdominal pain, blood in stool, constipation, diarrhea, heartburn, melena, nausea and vomiting.  Genitourinary:  Negative for dysuria, frequency and urgency.  Musculoskeletal:  Positive for back pain and joint pain.  Skin: Negative.  Negative for itching and rash.  Neurological:  Positive for tingling. Negative for dizziness, focal weakness, weakness and headaches.  Endo/Heme/Allergies:  Does not bruise/bleed easily.  Psychiatric/Behavioral:  Negative for depression. The patient is not nervous/anxious and does not have insomnia.      MEDICAL HISTORY:  Past Medical History:  Diagnosis Date   Allergy    Breast cancer    Cancer 06/15/2017   5.1 cm, T3,N1 (clinical): ER/ PR positive, Her 2 neu not overexpressed, High Ki 67. Neuoadjuvant chemotherapy.    Depression    Diabetes mellitus without complication 2017   Edema of left upper extremity    Endometriosis    Family history of breast cancer    Headache    migraines    Hyperlipidemia    Lymphedema of left arm    Ovarian mass    Personal history of chemotherapy    Personal history of radiation therapy    Pneumonia    2018    SURGICAL HISTORY: Past Surgical History:  Procedure Laterality Date   AXILLARY LYMPH NODE BIOPSY Left 07/14/2017   Procedure: INSERTION GEL MARK CLIP LEFT AXILLA;  Surgeon: Earline Mayotte, MD;  Location: [facility] ORS;  Service: General;  Laterality: Left;   BREAST BIOPSY Left    Dr Victorio Palm BREAST METASTATIC CARCINOMA   BREAST LUMPECTOMY Left 01/12/2018   COLONOSCOPY WITH PROPOFOL N/A 12/27/2019   Procedure: COLONOSCOPY WITH PROPOFOL;  Surgeon: Pasty Spillers, MD;  Location: [facility] ENDOSCOPY;  Service: Endoscopy;  Laterality: N/A;   COLONOSCOPY WITH PROPOFOL N/A 01/05/2022   Procedure: COLONOSCOPY WITH PROPOFOL;  Surgeon: Toney Reil, MD;  Location: [facility] ENDOSCOPY;  Service: Gastroenterology;  Laterality: N/A;   OOPHORECTOMY     PARTIAL MASTECTOMY WITH NEEDLE LOCALIZATION Left 01/12/2018   Procedure: PARTIAL MASTECTOMY WITH NEEDLE LOCALIZATION;  Surgeon: Earline Mayotte, MD;  Location: [facility] ORS;  Service: General;  Laterality: Left;   PORTACATH PLACEMENT Right 07/14/2017   Procedure: INSERTION PORT-A-CATH;  Surgeon: Earline Mayotte, MD;  Location: [facility] ORS;  Service: General;  Laterality: Right;   SENTINEL NODE BIOPSY Left 01/12/2018   Procedure: SENTINEL NODE BIOPSY;  Surgeon: Earline Mayotte, MD;  Location: [facility] ORS;  Service: General;  Laterality: Left;   TUBAL LIGATION      SOCIAL HISTORY: Social  History   Socioeconomic History   Marital status: Married    Spouse name: Not on file   Number of children: Not on file   Years of education: Not on file   Highest education level: Not on file  Occupational History   Not on file  Tobacco Use   Smoking status: Every Day    Packs/day: 1.00    Years: 11.00    Additional pack years: 0.00    Total pack years: 11.00    Types: Cigarettes   Smokeless  tobacco: Former    Types: Snuff  Vaping Use   Vaping Use: Never used  Substance and Sexual Activity   Alcohol use: No    Alcohol/week: 0.0 standard drinks of alcohol   Drug use: No   Sexual activity: Yes  Other Topics Concern   Not on file  Social History Narrative   ** Merged History Encounter **       Social Determinants of Health   Financial Resource Strain: Low Risk  (07/03/2019)   Overall Financial Resource Strain (CARDIA)    Difficulty of Paying Living Expenses: Not very hard  Food Insecurity: Not on file  Transportation Needs: Not on file  Physical Activity: Not on file  Stress: Not on file  Social Connections: Not on file  Intimate Partner Violence: Not on file    FAMILY HISTORY: Family History  Problem Relation Age of Onset   Colon cancer Mother    Cancer Mother    Other Father        family hx on dad's side: breast, colon, stomach cancer-biological dad   Diabetes Brother    Pancreatitis Brother    Prostate cancer Brother 51       currently 53 / maternal half-brother   Breast cancer Maternal Aunt 11       currently 1   Breast cancer Maternal Grandmother 40       deceased 21s   Colon cancer Maternal Grandmother    Breast cancer Other 16       mother's sister; deceased 35   Breast cancer Other        mother's sister; age at dx unknown    ALLERGIES:  is allergic to aspirin.  MEDICATIONS:  Current Outpatient Medications  Medication Sig Dispense Refill   amitriptyline (ELAVIL) 50 MG tablet TAKE 1 TABLET BY MOUTH EVERYDAY AT BEDTIME (Patient taking differently: Take 50 mg by mouth at bedtime as needed.) 90 tablet 2   aspirin-acetaminophen-caffeine (EXCEDRIN MIGRAINE) 250-250-65 MG tablet Take 2 tablets by mouth daily as needed for headache.      Blood Glucose Monitoring Suppl (ONETOUCH VERIO) w/Device KIT Use to check blood sugar 3 times a day and document results, bring to appointments.  Goal is <130 fasting blood sugar and <180 two hours after meals. 1  kit 0   DULoxetine (CYMBALTA) 60 MG capsule Take 60 mg by mouth daily.     famotidine (PEPCID) 20 MG tablet TAKE 1 TABLET BY MOUTH EVERY DAY 90 tablet 3   glucose blood test strip Use to check blood sugar 3 times daily, fasting in morning with goal <130 and 2 hours after meals with goal <180.  Bring blood sugar log to visits. 100 each 12   goserelin (ZOLADEX) 3.6 MG injection Inject 3.6 mg into the skin every 28 (twenty-eight) days.     Insulin Glargine (BASAGLAR KWIKPEN) 100 UNIT/ML Inject 45 Units into the skin daily. 45 mL 4   Insulin Pen Needle (NOVOFINE) 30G  X 8 MM MISC Inject 10 each into the skin as needed. 100 each 4   lamoTRIgine (LAMICTAL) 25 MG tablet TAKE 1 TABLET BY MOUTH TWICE A DAY 180 tablet 1   linagliptin (TRADJENTA) 5 MG TABS tablet Take 1 tablet (5 mg total) by mouth daily. 90 tablet 4   metFORMIN (GLUCOPHAGE-XR) 500 MG 24 hr tablet START BY TAKING ONE TABLET (500 MG) TWICE A DAY WITH MEALS AND THEN INCREASE IN ONE WEEK TO TWO TABLETS (1000 MG) TWICE A DAY WITH MEALS. 360 tablet 1   naloxone (NARCAN) nasal spray 4 mg/0.1 mL Place 1 spray into the nose as needed for up to 365 doses (for opioid-induced respiratory depresssion). In case of emergency (overdose), spray once into each nostril. If no response within 3 minutes, repeat application and call 911. 1 each 0   ondansetron (ZOFRAN) 8 MG tablet Take 1 tablet (8 mg total) by mouth 2 (two) times daily as needed for nausea or vomiting. 60 tablet 4   oxyCODONE (OXY IR/ROXICODONE) 5 MG immediate release tablet Take 1 tablet (5 mg total) by mouth 2 (two) times daily as needed for severe pain. Must last 30 days 60 tablet 0   [START ON 06/29/2022] oxyCODONE (OXY IR/ROXICODONE) 5 MG immediate release tablet Take 1 tablet (5 mg total) by mouth 2 (two) times daily as needed for severe pain. Must last 30 days 60 tablet 0   [START ON 07/29/2022] oxyCODONE (OXY IR/ROXICODONE) 5 MG immediate release tablet Take 1 tablet (5 mg total) by mouth 2  (two) times daily as needed for severe pain. Must last 30 days 60 tablet 0   pregabalin (LYRICA) 150 MG capsule TAKE 1 CAPSULE BY MOUTH TWICE A DAY 60 capsule 2   rosuvastatin (CRESTOR) 40 MG tablet TAKE 1 TABLET (40 MG TOTAL) BY MOUTH DAILY. STOP TAKING ATORVASTATIN. 90 tablet 3   SUMAtriptan (IMITREX) 100 MG tablet TAKE 1 TABLET BY MOUTH AS NEEDED FOR MIGRAINE. MAY REPEAT IN 2 HOURS IF HEADACHE PERSISTS OR RECURS AS DIRECTED. 10 tablet 0   tamoxifen (NOLVADEX) 10 MG tablet TAKE 1 TABLET BY MOUTH EVERY DAY 90 tablet 0   Vitamin D, Ergocalciferol, (DRISDOL) 1.25 MG (50000 UNIT) CAPS capsule TAKE 1 CAPSULE BY MOUTH ONE TIME PER WEEK 12 capsule 1   oxyCODONE (OXY IR/ROXICODONE) 5 MG immediate release tablet Take 1 tablet (5 mg total) by mouth 2 (two) times daily as needed for severe pain. Must last 30 days 60 tablet 0   No current facility-administered medications for this visit.   Facility-Administered Medications Ordered in Other Visits  Medication Dose Route Frequency Provider Last Rate Last Admin   heparin lock flush 100 unit/mL  500 Units Intravenous Once [place], Melissa C, MD       sodium chloride flush (NS) 0.9 % injection 10 mL  10 mL Intravenous Once Rosey Bath, MD          .  PHYSICAL EXAMINATION: ECOG PERFORMANCE STATUS: 0 - Asymptomatic  Vitals:   05/31/22 1446  BP: 123/74  Pulse: 96  Temp: (!) 97.4 F (36.3 C)  SpO2: 100%   Filed Weights   05/31/22 1446  Weight: 199 lb 9.6 oz (90.5 kg)    Physical Exam HENT:     Head: Normocephalic and atraumatic.     Mouth/Throat:     Pharynx: No oropharyngeal exudate.  Eyes:     Pupils: Pupils are equal, round, and reactive to light.  Cardiovascular:     Rate and  Rhythm: Normal rate and regular rhythm.  Pulmonary:     Effort: No respiratory distress.     Breath sounds: No wheezing.  Abdominal:     General: Bowel sounds are normal. There is no distension.     Palpations: Abdomen is soft. There is no mass.      Tenderness: There is no abdominal tenderness. There is no guarding or rebound.  Musculoskeletal:        General: No tenderness. Normal range of motion.     Cervical back: Normal range of motion and neck supple.  Skin:    General: Skin is warm.     Comments: Right and left BREAST exam [in the presence of nurse]- no unusual skin changes or dominant masses felt. Surgical scars noted.    Neurological:     Mental Status: She is alert and oriented to person, place, and time.  Psychiatric:        Mood and Affect: Affect normal.      LABORATORY DATA:  I have reviewed the data as listed Lab Results  Component Value Date   WBC 6.9 05/31/2022   HGB 12.8 05/31/2022   HCT 38.4 05/31/2022   MCV 94.8 05/31/2022   PLT 197 05/31/2022   Recent Labs    11/29/21 1252 12/03/21 1435 03/01/22 1340 05/31/22 1432  NA 138 141 136 134*  K 3.7 3.9 3.6 3.8  CL 105 106 100 100  CO2 GLUCOSE 123* 111* 330* 429*  BUN CREATININE 1.00 1.02* 1.22* 0.92  CALCIUM 9.0 9.3 9.1 9.1  GFRNONAA >60  --  53* >60  PROT 6.6 6.2 6.8 6.8  ALBUMIN 3.6 4.0 3.8 3.6  AST ALT ALKPHOS 49 59 77 76  BILITOT 0.4 0.3 0.2* 0.3    RADIOGRAPHIC STUDIES: I have personally reviewed the radiological images as listed and agreed with the findings in the report. No results found.  ASSESSMENT & PLAN:   Malignant neoplasm of lower-outer quadrant of left breast of female, estrogen receptor positive [facility]) # May 2029- Breast cancer Stage III ER/PR pos her 2 NEG. currently on Tamoxifen+ Zoldadex.  No clinical evidence of recurrence; STABLE. adjuvant Zometa q6M [June 2022 x3 years]; Zoladex today q3M.  Mammogram August 2023 [Dr.Piscoya]- WNL.   # Currently on Tamoxifen 10 mg [lower dose; previous intolerance to 20 mg/AI- ]. stable  # Mild Anemia- Hb 11.8;recommend PO gentle iron. Hx of colonoscopy- 2022 ? S/p colonoscopy [nov 2023;  Dr.Vanga. OCT 2023- 133; sat-19%.; continue  linzess- stable   # Depression/ Insomnia--stable On cymblata/Amiytrplitine qhs /Dr.vaslow[DI-none as per uptodate*] Dr.kapur]- stable   # Joint pains-multifactorial- Improved off Lyrica [As per pt ]; stable.   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   # DM-2 on insulin- poorly controlled  [A1c-6.8]Blood glucose 429 [PP]-;   discussed with the patient that she should follow her blood sugars and follow-up with PCP for better control.  # Port malfunction: flush; s/p TPA-- # port/IV access- Stable; discussed re: pro and cons of keeping the port vs. Explantation.willkeep for now.   Zometa q73m; zola 3 m  # DISPOSITION: # Zoladex today.  # Follow up in 3 month;  MD;cbc/cmp; ca-27-29; Zoladex; zometa.. port flush. Dr.B  All questions were answered. The patient knows to call the clinic with any problems, questions or concerns.    Stefano Gaul  Leeanne Deed, MD 05/31/2022 3:25 PM

## 2022-05-31 NOTE — Addendum Note (Signed)
Addended by: Darrold Span A on: 05/31/2022 03:33 PM   Modules accepted: Orders

## 2022-05-31 NOTE — Assessment & Plan Note (Addendum)
#   May 2029- Breast cancer Stage III ER/PR pos her 2 NEG. currently on Tamoxifen+ Zoldadex.  No clinical evidence of recurrence; STABLE. adjuvant Zometa q6M [June 2022 x3 years]; Zoladex today q3M.  Mammogram August 2023 [Dr.Piscoya]- WNL.   # Currently on Tamoxifen 10 mg [lower dose; previous intolerance to 20 mg/AI- ]. stable  # Mild Anemia- Hb 11.8;recommend PO gentle iron. Hx of colonoscopy- 2022 ? S/p colonoscopy [nov 2023;  Dr.Vanga. OCT 2023- 133; sat-19%.; continue linzess- stable   # Depression/ Insomnia--stable On cymblata/Amiytrplitine qhs /Dr.vaslow[DI-none as per uptodate*] Dr.kapur]- stable   # Joint pains-multifactorial- Improved off Lyrica [As per pt ]; stable.   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   # DM-2 on insulin- poorly controlled  [A1c-6.8]Blood glucose 429 [PP]-;   discussed with the patient that she should follow her blood sugars and follow-up with PCP for better control.  # Port malfunction: flush; s/p TPA-- # port/IV access- Stable; discussed re: pro and cons of keeping the port vs. Explantation.willkeep for now.   Zometa q57m; zola 3 m  # DISPOSITION: # Zoladex today.  # Follow up in 3 month;  MD;cbc/cmp; ca-27-29; Zoladex; zometa.. port flush. Dr.B

## 2022-05-31 NOTE — Progress Notes (Signed)
No concerns today 

## 2022-06-01 ENCOUNTER — Other Ambulatory Visit: Payer: Self-pay | Admitting: Nurse Practitioner

## 2022-06-01 LAB — CANCER ANTIGEN 27.29: CA 27.29: 13.1 U/mL (ref 0.0–38.6)

## 2022-06-02 NOTE — Telephone Encounter (Signed)
Requested medication (s) are due for refill today: yes  Requested medication (s) are on the active medication list: yes    Last refill: 01/21/22  #60  2 refills  Future visit scheduled yes 06/06/22  Notes to clinic:Not delegated, please review. Thank you  Requested Prescriptions  Pending Prescriptions Disp Refills   pregabalin (LYRICA) 150 MG capsule [Pharmacy Med Name: PREGABALIN 150 MG CAPSULE] 60 capsule 2    Sig: TAKE 1 CAPSULE BY MOUTH TWICE A DAY     Not Delegated - Neurology:  Anticonvulsants - Controlled - pregabalin Failed - 06/01/2022  3:44 PM      Failed - This refill cannot be delegated      Passed - Cr in normal range and within 360 days    Creatinine, Ser  Date Value Ref Range Status  05/31/2022 0.92 0.44 - 1.00 mg/dL Final         Passed - Completed PHQ-2 or PHQ-9 in the last 360 days      Passed - Valid encounter within last 12 months    Recent Outpatient Visits           2 months ago Type 2 diabetes mellitus with hyperglycemia, with long-term current use of insulin (HCC)   [facility] [place], [place] T, NP   6 months ago Type 2 diabetes mellitus with hyperglycemia, with long-term current use of insulin (HCC)   [place] [facility] [place], [place] T, NP   9 months ago Type 2 diabetes mellitus with hyperglycemia, with long-term current use of insulin (HCC)   [facility] [place], [place] T, NP   10 months ago Type 2 diabetes mellitus with hyperglycemia, with long-term current use of insulin (HCC)   [place] [facility] [place], [place] T, NP   1 year ago Type 2 diabetes mellitus with hyperglycemia, with long-term current use of insulin (HCC)   [facility] [place], Dorie Rank, NP       Future Appointments             In 4 days Marjie Skiff, NP  [facility]

## 2022-06-04 ENCOUNTER — Other Ambulatory Visit: Payer: Self-pay | Admitting: Internal Medicine

## 2022-06-05 NOTE — Patient Instructions (Signed)
Be Involved in Your Health Care:  Taking Medications When medications are taken as directed, they can greatly improve your health. But if they are not taken as instructed, they may not work. In some cases, not taking them correctly can be harmful. To help ensure your treatment remains effective and safe, understand your medications and how to take them.  Your lab results, notes and after visit summary will be available on My Chart. We strongly encourage you to use this feature. If lab results are abnormal the clinic will contact you with the appropriate steps. If the clinic does not contact you assume the results are satisfactory. You can always see your results on My Chart. If you have questions regarding your condition, please contact the clinic during office hours. You can also ask questions on My Chart.  We at [facility] are grateful that you chose us to provide care. We strive to provide excellent and compassionate care and are always looking for feedback. If you get a survey from the clinic please complete this.   Diabetes Mellitus Basics  Diabetes mellitus, or diabetes, is a long-term (chronic) disease. It occurs when the body does not properly use sugar (glucose) that is released from food after you eat. Diabetes mellitus may be caused by one or both of these problems: Your pancreas does not make enough of a hormone called insulin. Your body does not react in a normal way to the insulin that it makes. Insulin lets glucose enter cells in your body. This gives you energy. If you have diabetes, glucose cannot get into cells. This causes high blood glucose (hyperglycemia). How to treat and manage diabetes You may need to take insulin or other diabetes medicines daily to keep your glucose in balance. If you are prescribed insulin, you will learn how to give yourself insulin by injection. You may need to adjust the amount of insulin you take based on the foods that you eat. You will  need to check your blood glucose levels using a glucose monitor as told by your health care provider. The readings can help determine if you have low or high blood glucose. Generally, you should have these blood glucose levels: Before meals (preprandial): 80-130 mg/dL (4.4-7.2 mmol/L). After meals (postprandial): below 180 mg/dL (10 mmol/L). Hemoglobin A1c (HbA1c) level: less than 7%. Your health care provider will set treatment goals for you. Keep all follow-up visits. This is important. Follow these instructions at home: Diabetes medicines Take your diabetes medicines every day as told by your health care provider. List your diabetes medicines here: Name of medicine: ______________________________ Amount (dose): _______________ Time (a.m./p.m.): _______________ Notes: ___________________________________ Name of medicine: ______________________________ Amount (dose): _______________ Time (a.m./p.m.): _______________ Notes: ___________________________________ Name of medicine: ______________________________ Amount (dose): _______________ Time (a.m./p.m.): _______________ Notes: ___________________________________ Insulin If you use insulin, list the types of insulin you use here: Insulin type: ______________________________ Amount (dose): _______________ Time (a.m./p.m.): _______________Notes: ___________________________________ Insulin type: ______________________________ Amount (dose): _______________ Time (a.m./p.m.): _______________ Notes: ___________________________________ Insulin type: ______________________________ Amount (dose): _______________ Time (a.m./p.m.): _______________ Notes: ___________________________________ Insulin type: ______________________________ Amount (dose): _______________ Time (a.m./p.m.): _______________ Notes: ___________________________________ Insulin type: ______________________________ Amount (dose): _______________ Time (a.m./p.m.): _______________  Notes: ___________________________________ Managing blood glucose  Check your blood glucose levels using a glucose monitor as told by your health care provider. Write down the times that you check your glucose levels here: Time: _______________ Notes: ___________________________________ Time: _______________ Notes: ___________________________________ Time: _______________ Notes: ___________________________________ Time: _______________ Notes: ___________________________________ Time: _______________ Notes: ___________________________________ Time: _______________ Notes: ___________________________________    Low blood glucose Low blood glucose (hypoglycemia) is when glucose is at or below 70 mg/dL (3.9 mmol/L). Symptoms may include: Feeling: Hungry. Sweaty and clammy. Irritable or easily upset. Dizzy. Sleepy. Having: A fast heartbeat. A headache. A change in your vision. Numbness around the mouth, lips, or tongue. Having trouble with: Moving (coordination). Sleeping. Treating low blood glucose To treat low blood glucose, eat or drink something containing sugar right away. If you can think clearly and swallow safely, follow the 15:15 rule: Take 15 grams of a fast-acting carb (carbohydrate), as told by your health care provider. Some fast-acting carbs are: Glucose tablets: take 3-4 tablets. Hard candy: eat 3-5 pieces. Fruit juice: drink 4 oz (120 mL). Regular (not diet) soda: drink 4-6 oz (120-180 mL). Honey or sugar: eat 1 Tbsp (15 mL). Check your blood glucose levels 15 minutes after you take the carb. If your glucose is still at or below 70 mg/dL (3.9 mmol/L), take 15 grams of a carb again. If your glucose does not go above 70 mg/dL (3.9 mmol/L) after 3 tries, get help right away. After your glucose goes back to normal, eat a meal or a snack within 1 hour. Treating very low blood glucose If your glucose is at or below 54 mg/dL (3 mmol/L), you have very low blood glucose  (severe hypoglycemia). This is an emergency. Do not wait to see if the symptoms will go away. Get medical help right away. Call your local emergency services (911 in the [place] Do not drive yourself to the hospital. Questions to ask your health care provider Should I talk with a diabetes educator? What equipment will I need to care for myself at home? What diabetes medicines do I need? When should I take them? How often do I need to check my blood glucose levels? What number can I call if I have questions? When is my follow-up visit? Where can I find a support group for people with diabetes? Where to find more information American Diabetes Association: www.diabetes.org Association of Diabetes Care and Education Specialists: www.diabeteseducator.org Contact a health care provider if: Your blood glucose is at or above 240 mg/dL (13.3 mmol/L) for 2 days in a row. You have been sick or have had a fever for 2 days or more, and you are not getting better. You have any of these problems for more than 6 hours: You cannot eat or drink. You feel nauseous. You vomit. You have diarrhea. Get help right away if: Your blood glucose is lower than 54 mg/dL (3 mmol/L). You get confused. You have trouble thinking clearly. You have trouble breathing. These symptoms may represent a serious problem that is an emergency. Do not wait to see if the symptoms will go away. Get medical help right away. Call your local emergency services (911 in the [place] Do not drive yourself to the hospital. Summary Diabetes mellitus is a chronic disease that occurs when the body does not properly use sugar (glucose) that is released from food after you eat. Take insulin and diabetes medicines as told. Check your blood glucose every day, as often as told. Keep all follow-up visits. This is important. This information is not intended to replace advice given to you by your health care provider. Make sure you discuss any  questions you have with your health care provider. Document Revised: 05/28/2019 Document Reviewed: 05/28/2019 Elsevier Patient Education  2023 Elsevier Inc.  

## 2022-06-06 ENCOUNTER — Encounter: Payer: Self-pay | Admitting: Internal Medicine

## 2022-06-06 ENCOUNTER — Ambulatory Visit (INDEPENDENT_AMBULATORY_CARE_PROVIDER_SITE_OTHER): Payer: BLUE CROSS/BLUE SHIELD | Admitting: Nurse Practitioner

## 2022-06-06 VITALS — BP 99/68 | HR 96 | Temp 99.0°F | Ht 64.02 in | Wt 204.2 lb

## 2022-06-06 DIAGNOSIS — Z794 Long term (current) use of insulin: Secondary | ICD-10-CM

## 2022-06-06 DIAGNOSIS — E785 Hyperlipidemia, unspecified: Secondary | ICD-10-CM | POA: Diagnosis not present

## 2022-06-06 DIAGNOSIS — T451X5A Adverse effect of antineoplastic and immunosuppressive drugs, initial encounter: Secondary | ICD-10-CM

## 2022-06-06 DIAGNOSIS — G894 Chronic pain syndrome: Secondary | ICD-10-CM

## 2022-06-06 DIAGNOSIS — E1165 Type 2 diabetes mellitus with hyperglycemia: Secondary | ICD-10-CM

## 2022-06-06 DIAGNOSIS — C50512 Malignant neoplasm of lower-outer quadrant of left female breast: Secondary | ICD-10-CM | POA: Diagnosis not present

## 2022-06-06 DIAGNOSIS — G620 Drug-induced polyneuropathy: Secondary | ICD-10-CM

## 2022-06-06 DIAGNOSIS — F322 Major depressive disorder, single episode, severe without psychotic features: Secondary | ICD-10-CM | POA: Diagnosis not present

## 2022-06-06 DIAGNOSIS — F1120 Opioid dependence, uncomplicated: Secondary | ICD-10-CM

## 2022-06-06 DIAGNOSIS — E1169 Type 2 diabetes mellitus with other specified complication: Secondary | ICD-10-CM

## 2022-06-06 DIAGNOSIS — F19982 Other psychoactive substance use, unspecified with psychoactive substance-induced sleep disorder: Secondary | ICD-10-CM

## 2022-06-06 DIAGNOSIS — Z6835 Body mass index (BMI) 35.0-35.9, adult: Secondary | ICD-10-CM

## 2022-06-06 DIAGNOSIS — F17219 Nicotine dependence, cigarettes, with unspecified nicotine-induced disorders: Secondary | ICD-10-CM

## 2022-06-06 DIAGNOSIS — I951 Orthostatic hypotension: Secondary | ICD-10-CM

## 2022-06-06 DIAGNOSIS — Z170 Estrogen receptor positive status [ER+]: Secondary | ICD-10-CM

## 2022-06-06 LAB — BAYER DCA HB A1C WAIVED: HB A1C (BAYER DCA - WAIVED): 8.7 % — ABNORMAL HIGH (ref 4.8–5.6)

## 2022-06-06 MED ORDER — LANTUS SOLOSTAR 100 UNIT/ML ~~LOC~~ SOPN: 45.0000 [IU] | PEN_INJECTOR | Freq: Every day | SUBCUTANEOUS | 6 refills | Status: DC

## 2022-06-06 MED ORDER — SITAGLIPTIN PHOSPHATE 100 MG PO TABS: 100.0000 mg | ORAL_TABLET | Freq: Every day | ORAL | 4 refills | Status: DC

## 2022-06-06 NOTE — Progress Notes (Signed)
BP 99/68   Pulse 96   Temp 99 F (37.2 C) (Oral)   Ht 5' 4.02" (1.626 m)   Wt 204 lb 3.2 oz (92.6 kg)   LMP  (LMP Unknown) Comment: LAST PERIOD IN MAY 2019 WHEN SHE STARTED CHEMO  SpO2 93%   BMI 35.03 kg/m    Subjective:    Patient ID: Carolyn Roy, female    DOB: 02/18/1965, 57 y.o.   MRN: [id number]  HPI: Carolyn Roy is a 56 y.o. female  Chief Complaint  Patient presents with   Diabetes   Hyperlipidemia   Mood   Pain   DIABETES A1c 8.4% in January. To be taking Metformin 1000 MG BID, Tradjenta 5 MG, and Basaglar 40 units.  Trulicity stopped due to GI effects and Jardiance was too costly.  No steroid use recently.  New insurance will not cover Basaglar or Tradjenta.  Taking Lantus at this time 40 units, but nothing to cover Trajenta.    She is a smoker, 1/2 PPD.  Quit for 6 years, but then started back.  She started smoking at age 59, smoked >35 years total. Hypoglycemic episodes:no Polydipsia/polyuria: no Visual disturbance: no Chest pain: no Paresthesias: no Glucose Monitoring: yes  Accucheck frequency: occasional  Fasting glucose: 213 on recent check  Post prandial:   Evening:   Before meals:  Taking Insulin?: yes  Long acting insulin: 40 units  Short acting insulin: Blood Pressure Monitoring: not checking Retinal Examination: Not Up To Date Foot Exam: Up to Date Pneumovax: Up To Date Influenza: Up To Date Aspirin: no   HYPOTENSION Saw cardiology for this on 12/01/21 -- cardiac work-up was stable and is wearing compression shorts daily.   Recurrent headaches: at times at baseline migraines Visual changes: none Palpitations: occasional, baseline Dyspnea: no Chest pain: no Lower extremity edema: no Dizzy/lightheaded: no, but gets a little off balance at times  HYPERLIPIDEMIA Taking Rosuvastatin 40 MG daily.  Had sleep study at [facility] in 2023, she reports Dr. Barbaraann Cao told her they want to do a CPAP titration -- does not have CPAP at  this time.  Oncology has been working on assisting. Satisfied with current treatment? yes Duration of hyperlipidemia: chronic Cholesterol medication side effects: no Cholesterol supplements: none Aspirin: no Recent stressors: no Recurrent headaches: no Visual changes: no Palpitations: no Dyspnea: no Chest pain: no Lower extremity edema: no Dizzy/lightheaded: no  The ASCVD Risk score (Arnett DK, et al., 2019) failed to calculate for the following reasons:   The valid total cholesterol range is 130 to 320 mg/dL   CHRONIC PAIN  Followed by pain clinic for chronic pain -- last visit 05/25/22 continues Oxycodone and Pregabalin.   Pain control status: stable Duration: chronic Location: knees and back Quality: dull, aching, and throbbing Current Pain Level: 4/10 Previous Pain Level: 10/10 Breakthrough pain: no Benefit from narcotic medications: yes What Activities task can be accomplished with current medication? Able to work and perform ADLs Interested in weaning off narcotics:no   Stool softners/OTC fiber: yes  Previous pain specialty evaluation: yes Non-narcotic analgesic meds: no Narcotic contract: yes   DEPRESSION Taking Duloxetine 60 MG daily, Lamictal 25 MG BID,  + Amitriptyline PRN.  Followed by Dr. Maryruth Bun, last visit 10/25/21.  Has breast cancer and last visit with oncology 05/31/22.  Receives infusions. Mood status: stable  States she occasionally takes duloxetine. Satisfied with current treatment?: yes Symptom severity: stable to mild  Duration of current treatment : chronic Side effects: no Medication  compliance: good compliance Psychotherapy/counseling: none Depressed mood: occasional Anxious mood: occasional Anhedonia: no Significant weight loss or gain: no Insomnia: yes hard to stay asleep Fatigue: no Feelings of worthlessness or guilt: no Impaired concentration/indecisiveness: no Suicidal ideations: no Hopelessness: no Crying spells: occasional     06/06/2022    2:38 PM 05/25/2022    2:59 PM 03/07/2022   10:20 AM 12/03/2021    2:32 PM 11/24/2021    2:55 PM  Depression screen PHQ 2/9  Decreased Interest 1 0 0 0 0  Down, Depressed, Hopeless 1 0 1 1 0  PHQ - 2 Score 2 0 1 1 0  Altered sleeping 3  1 1    Tired, decreased energy 1  1 1    Change in appetite 3  1 1    Feeling bad or failure about yourself  0  0 0   Trouble concentrating 2  0 0   Moving slowly or fidgety/restless 2  0 2   Suicidal thoughts 0  0 0   PHQ-9 Score 13  4 6    Difficult doing work/chores   Somewhat difficult Somewhat difficult       06/06/2022    2:38 PM 03/07/2022   10:21 AM 12/03/2021    2:33 PM 08/25/2021    2:14 PM  GAD 7 : Generalized Anxiety Score  Nervous, Anxious, on Edge 1 0 1 1  Control/stop worrying 0 0 0 0  Worry too much - different things 0 0 0 1  Trouble relaxing 1 0 0 0  Restless 1 0 0 0  Easily annoyed or irritable 1 1 0 0  Afraid - awful might happen 1 0 0 0  Total GAD 7 Score 5 1 1 2   Anxiety Difficulty  Somewhat difficult Somewhat difficult Not difficult at all    Relevant past medical, surgical, family and social history reviewed and updated as indicated. Interim medical history since our last visit reviewed. Allergies and medications reviewed and updated.  Review of Systems  Constitutional:  Negative for activity change, appetite change, fatigue and fever.  Respiratory:  Negative for cough, chest tightness and shortness of breath.   Cardiovascular:  Negative for chest pain, palpitations and leg swelling.  Gastrointestinal: Negative.   Endocrine: Negative for cold intolerance, heat intolerance, polydipsia, polyphagia and polyuria.  Genitourinary:  Negative for difficulty urinating.  Musculoskeletal:  Negative for myalgias.  Neurological: Negative.  Negative for dizziness and light-headedness.  Psychiatric/Behavioral:  Negative for decreased concentration, self-injury, sleep disturbance and suicidal ideas. The patient is not  nervous/anxious.    Per HPI unless specifically indicated above     Objective:    BP 99/68   Pulse 96   Temp 99 F (37.2 C) (Oral)   Ht 5' 4.02" (1.626 m)   Wt 204 lb 3.2 oz (92.6 kg)   LMP  (LMP Unknown) Comment: LAST PERIOD IN MAY 2019 WHEN SHE STARTED CHEMO  SpO2 93%   BMI 35.03 kg/m   Wt Readings from Last 3 Encounters:  06/06/22 204 lb 3.2 oz (92.6 kg)  05/31/22 199 lb 9.6 oz (90.5 kg)  05/25/22 195 lb (88.5 kg)    Physical Exam Vitals and nursing note reviewed.  Constitutional:      General: She is awake. She is not in acute distress.    Appearance: She is well-developed and well-groomed. She is obese. She is not ill-appearing or toxic-appearing.  HENT:     Head: Normocephalic.     Right Ear: Hearing normal.  Left Ear: Hearing normal.  Eyes:     General: Lids are normal.        Right eye: No discharge.        Left eye: No discharge.     Conjunctiva/sclera: Conjunctivae normal.     Pupils: Pupils are equal, round, and reactive to light.  Neck:     Thyroid: No thyromegaly.     Vascular: No carotid bruit.  Cardiovascular:     Rate and Rhythm: Normal rate and regular rhythm.     Heart sounds: Normal heart sounds. No murmur heard.    No gallop.  Pulmonary:     Effort: Pulmonary effort is normal. No accessory muscle usage or respiratory distress.     Breath sounds: Normal breath sounds. No wheezing or rhonchi.  Abdominal:     General: Bowel sounds are normal. There is no distension.     Palpations: Abdomen is soft.     Tenderness: There is no abdominal tenderness.  Musculoskeletal:     Cervical back: Normal range of motion and neck supple.     Right lower leg: No edema.     Left lower leg: No edema.  Lymphadenopathy:     Cervical: No cervical adenopathy.  Skin:    General: Skin is warm and dry.  Neurological:     Mental Status: She is alert and oriented to person, place, and time.     Cranial Nerves: Cranial nerves 2-12 are intact.     Motor: Motor  function is intact.     Coordination: Coordination is intact.     Gait: Gait is intact.     Deep Tendon Reflexes: Reflexes are normal and symmetric.     Reflex Scores:      Brachioradialis reflexes are 2+ on the right side and 2+ on the left side.      Patellar reflexes are 2+ on the right side and 2+ on the left side. Psychiatric:        Attention and Perception: Attention normal.        Mood and Affect: Mood normal.        Speech: Speech normal.        Behavior: Behavior normal. Behavior is cooperative.        Thought Content: Thought content normal.    Results for orders placed or performed in visit on 05/31/22  Cancer antigen 27.29  Result Value Ref Range   CA 27.29 13.1 0.0 - 38.6 U/mL  Comprehensive metabolic panel  Result Value Ref Range   Sodium 134 (L) 135 - 145 mmol/L   Potassium 3.8 3.5 - 5.1 mmol/L   Chloride 100 98 - 111 mmol/L   CO2 25 22 - 32 mmol/L   Glucose, Bld 429 (H) 70 - 99 mg/dL   BUN 13 6 - 20 mg/dL   Creatinine, Ser 1.61 0.44 - 1.00 mg/dL   Calcium 9.1 8.9 - 09.6 mg/dL   Total Protein 6.8 6.5 - 8.1 g/dL   Albumin 3.6 3.5 - 5.0 g/dL   AST 17 15 - 41 U/L   ALT 13 0 - 44 U/L   Alkaline Phosphatase 76 38 - 126 U/L   Total Bilirubin 0.3 0.3 - 1.2 mg/dL   GFR, Estimated >04 >54 mL/min   Anion gap 9 5 - 15  CBC with Differential  Result Value Ref Range   WBC 6.9 4.0 - 10.5 K/uL   RBC 4.05 3.87 - 5.11 MIL/uL   Hemoglobin 12.8 12.0 - 15.0 g/dL  HCT 38.4 36.0 - 46.0 %   MCV 94.8 80.0 - 100.0 fL   MCH 31.6 26.0 - 34.0 pg   MCHC 33.3 30.0 - 36.0 g/dL   RDW 16.1 09.6 - 04.5 %   Platelets 197 150 - 400 K/uL   nRBC 0.0 0.0 - 0.2 %   Neutrophils Relative % 69 %   Neutro Abs 4.7 1.7 - 7.7 K/uL   Lymphocytes Relative 25 %   Lymphs Abs 1.7 0.7 - 4.0 K/uL   Monocytes Relative 5 %   Monocytes Absolute 0.4 0.1 - 1.0 K/uL   Eosinophils Relative 0 %   Eosinophils Absolute 0.0 0.0 - 0.5 K/uL   Basophils Relative 1 %   Basophils Absolute 0.0 0.0 - 0.1 K/uL    Immature Granulocytes 0 %   Abs Immature Granulocytes 0.02 0.00 - 0.07 K/uL      Assessment & Plan:   Problem List Items Addressed This Visit       Cardiovascular and Mediastinum   Hypotension    Ongoing, stable. Baseline BP between 90-110/60-70 on review going back to 2020.  Recommend to continue to take plenty of water intake daily and add a some salt to diet + wear compression on during day and off at night.  She has seen cardiology with overall stable work-up.        Endocrine   Hyperlipidemia associated with type 2 diabetes mellitus (HCC)    Chronic, ongoing.  Continue Rosuvastatin 40 MG daily.  Lipid panel today and adjust dose as needed.      Relevant Medications   sitaGLIPtin (JANUVIA) 100 MG tablet   insulin glargine (LANTUS SOLOSTAR) 100 UNIT/ML Solostar Pen   Other Relevant Orders   Bayer DCA Hb A1c Waived   AMB Referral to Pharmacy Medication Management   Type 2 diabetes mellitus with hyperglycemia, with long-term current use of insulin (HCC) - Primary    Chronic, ongoing.  A1c 8.7% today, prior was 8.4%, trending up with due to missing some medication doses -- insurance changes recently. Eye exam and foot exam up to date.  Can not take Jardiance due to cost. Trulicity caused GI issues.  Will place pharmacist referral to assist with medications and costs, discussed with her today. - Increase Lantus to 45 units and will send in Januvia to see if coverage for this, she will alert provider. -  Will continue Metformin at current dosing.  May benefit stopping DPP4 in future and retrial of Ozempic if cost effective and covered. -  Recommend monitor BS at home TID and heavily focus on diet changes.  - Urine ALB 150 January 2024, can not take ACE/ARB due to hypotension.  Statin on board.   - Return in 3 months for A1c check.  Will have return to endo if ongoing poor control.      Relevant Medications   sitaGLIPtin (JANUVIA) 100 MG tablet   insulin glargine (LANTUS SOLOSTAR)  100 UNIT/ML Solostar Pen   Other Relevant Orders   Bayer DCA Hb A1c Waived   AMB Referral to Pharmacy Medication Management     Nervous and Auditory   Chemotherapy-induced neuropathy (HCC) (Chronic)    Chronic, ongoing.  Continue current pain management collaboration and medication regimen as prescribed by them.      Nicotine dependence, cigarettes, w unsp disorders    I have recommended complete cessation of tobacco use. I have discussed various options available for assistance with tobacco cessation including over the counter methods (Nicotine gum, patch  and lozenges). We also discussed prescription options (Chantix, Nicotine Inhaler / Nasal Spray). The patient is not interested in pursuing any prescription tobacco cessation options at this time.  Recommend we start lung CT screening, she will plan on this next visit as has had a lot of testing recently.          Other   Chronic pain syndrome (Chronic)    Followed by pain management, continue this collaboration, recent notes reviewed.      Uncomplicated opioid dependence (HCC) (Chronic)    Followed by pain management, continue this collaboration, recent notes reviewed.      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  Followed by Dr. Maryruth Bun with psychiatry, continue this collaboration.      Malignant neoplasm of lower-outer quadrant of left breast of female, estrogen receptor positive (HCC)   Obesity    BMI 35.03.  Recommended eating smaller high protein, low fat meals more frequently and exercising 30 mins a day 5 times a week with a goal of 10-15lb weight loss in the next 3 months. Patient voiced their understanding and motivation to adhere to these recommendations.       Relevant Medications   sitaGLIPtin (JANUVIA) 100 MG tablet   insulin glargine (LANTUS SOLOSTAR) 100 UNIT/ML Solostar Pen   Severe depression (HCC)    Chronic, ongoing.  Continue current medication regimen and collaboration with psychiatry.  Currently  denies SI/HI.  Recommend she schedule f/u with Dr. Maryruth Bun.        Follow up plan: Return in about 3 months (around 09/05/2022) for T2DM, HTN/HLD, MOOD == pap smear as well.

## 2022-06-06 NOTE — Assessment & Plan Note (Signed)
BMI 35.03.  Recommended eating smaller high protein, low fat meals more frequently and exercising 30 mins a day 5 times a week with a goal of 10-15lb weight loss in the next 3 months. Patient voiced their understanding and motivation to adhere to these recommendations.  

## 2022-06-06 NOTE — Assessment & Plan Note (Signed)
I have recommended complete cessation of tobacco use. I have discussed various options available for assistance with tobacco cessation including over the counter methods (Nicotine gum, patch and lozenges). We also discussed prescription options (Chantix, Nicotine Inhaler / Nasal Spray). The patient is not interested in pursuing any prescription tobacco cessation options at this time.  Recommend we start lung CT screening, she will plan on this next visit as has had a lot of testing recently. ? ?

## 2022-06-06 NOTE — Assessment & Plan Note (Signed)
Followed by pain management, continue this collaboration, recent notes reviewed. 

## 2022-06-06 NOTE — Assessment & Plan Note (Signed)
Chronic, ongoing.  Continue Rosuvastatin 40 MG daily.  Lipid panel today and adjust dose as needed. 

## 2022-06-06 NOTE — Assessment & Plan Note (Signed)
Chronic, ongoing.  Continue current pain management collaboration and medication regimen as prescribed by them. °

## 2022-06-06 NOTE — Assessment & Plan Note (Signed)
Chronic, stable.  Followed by Dr. Kapur with psychiatry, continue this collaboration. 

## 2022-06-06 NOTE — Assessment & Plan Note (Signed)
Ongoing, stable. Baseline BP between 90-110/60-70 on review going back to 2020.  Recommend to continue to take plenty of water intake daily and add a some salt to diet + wear compression on during day and off at night.  She has seen cardiology with overall stable work-up. 

## 2022-06-06 NOTE — Assessment & Plan Note (Addendum)
Chronic, ongoing.  A1c 8.7% today, prior was 8.4%, trending up with due to missing some medication doses -- insurance changes recently. Eye exam and foot exam up to date.  Can not take Jardiance due to cost. Trulicity caused GI issues.  Will place pharmacist referral to assist with medications and costs, discussed with her today. - Increase Lantus to 45 units and will send in Januvia to see if coverage for this, she will alert provider. -  Will continue Metformin at current dosing.  May benefit stopping DPP4 in future and retrial of Ozempic if cost effective and covered. -  Recommend monitor BS at home TID and heavily focus on diet changes.  - Urine ALB 150 January 2024, can not take ACE/ARB due to hypotension.  Statin on board.   - Return in 3 months for A1c check.  Will have return to endo if ongoing poor control.

## 2022-06-06 NOTE — Assessment & Plan Note (Addendum)
Chronic, ongoing.  Continue current medication regimen and collaboration with psychiatry.  Currently denies SI/HI.  Recommend she schedule f/u with Dr. Maryruth Bun.

## 2022-06-08 ENCOUNTER — Telehealth: Payer: Self-pay

## 2022-06-08 NOTE — Progress Notes (Unsigned)
   Care Guide Note  06/08/2022 Name: Carolyn Roy MRN: [id number] DOB: Nov 23, 1965  Referred by: Marjie Skiff, NP Reason for referral : Care Coordination (Outreach to schedule with Pharm d )   Carolyn Roy is a 57 y.o. year old female who is a primary care patient of Cannady, Dorie Rank, NP. [name] was referred to the pharmacist for assistance related to DM.    An unsuccessful telephone outreach was attempted today to contact the patient who was referred to the pharmacy team for assistance with medication management. Additional attempts will be made to contact the patient.   Carolyn Roy, RMA Care Guide [facility]  [place], [place] [address] Direct Dial: [phone] [email or web address]

## 2022-06-20 NOTE — Progress Notes (Unsigned)
   Care Guide Note  06/20/2022 Name: Carolyn Roy MRN: [id number] DOB: 1965/11/29  Referred by: Marjie Skiff, NP Reason for referral : Care Coordination (Outreach to schedule with Pharm d )   Carolyn Roy is a 57 y.o. year old female who is a primary care patient of Cannady, Dorie Rank, NP. [name] was referred to the pharmacist for assistance related to DM.    A second unsuccessful telephone outreach was attempted today to contact the patient who was referred to the pharmacy team for assistance with medication management. Additional attempts will be made to contact the patient.  Penne Lash, RMA Care Guide [facility]  [place], [place] [address] Direct Dial: [phone] [email or web address]

## 2022-06-22 NOTE — Progress Notes (Signed)
   Care Guide Note  06/22/2022 Name: Carolyn Roy MRN: [id number] DOB: 1965-06-25  Referred by: Marjie Skiff, NP Reason for referral : Care Coordination (Outreach to schedule with Pharm d )   Carolyn Roy is a 57 y.o. year old female who is a primary care patient of Cannady, Dorie Rank, NP. [name] was referred to the pharmacist for assistance related to DM.    Successful contact was made with the patient to discuss pharmacy services including being ready for the pharmacist to call at least 5 minutes before the scheduled appointment time, to have medication bottles and any blood sugar or blood pressure readings ready for review. The patient agreed to meet with the pharmacist via with the pharmacist via telephone visit on (date/time).  06/27/2022  Penne Lash, RMA Care Guide [facility]  [place], [place] [address] Direct Dial: [phone] [email or web address]

## 2022-06-27 ENCOUNTER — Other Ambulatory Visit: Payer: BLUE CROSS/BLUE SHIELD

## 2022-06-27 NOTE — Progress Notes (Signed)
06/27/2022 Name: Carolyn Roy MRN: [id number] DOB: November 02, 1965  Chief Complaint  Patient presents with   Medication Management   Medication Adherence   Carolyn Roy is a 57 y.o. year old female who presented for a telephone visit.   They were referred to the pharmacist by their PCP for assistance in managing medication access.   Patient is participating in a Managed Medicaid Plan:  No  Subjective: Telephone visit to assist with affordability of and adherence to diabetes medications  Care Team: Primary Care Provider: Marjie Skiff, NP ; Next Scheduled Visit: 09/07/22  Medication Access/Adherence -Patient reports affordability concerns with their medications: Yes  -Patient reports access/transportation concerns to their pharmacy: No  -Patient reports adherence concerns with their medications:  Yes  Has been without Januvia due to cost  Diabetes: Current medications: Lantus 45 units daily, metformin XR 1000mg  BID -Medications tried in the past: Tradjenta (ins quit covering), Ozempic (tolerated well), Trulicity (caused adverse side effects) -Patient has [employer] testing supplies -Has not been able to fill Januvia due to $100 copay on insurance -PCP would like also like to revisit Ozempic if possible -Patient will be going to her husband's insurance only starting June 1st- currently primary coverage through her employer, and secondary through his  Objective: Lab Results  Component Value Date   HGBA1C 8.7 (H) 06/06/2022   Medications Reviewed Today     Reviewed by Lenna Gilford, RPH (Pharmacist) on 06/27/22 at 1518  Med List Status: <None>   Medication Order Taking? Sig Documenting Provider Last Dose Status Informant  amitriptyline (ELAVIL) 50 MG tablet [id number] Yes TAKE 1 TABLET BY MOUTH EVERYDAY AT BEDTIME  Patient taking differently: Take 50 mg by mouth at bedtime as needed.   Henreitta Leber, MD Taking Active            Med Note (HOBBS, ASHLEY  L   Wed Dec 22, 2021  1:41 PM) As needed   aspirin-acetaminophen-caffeine [facility] MIGRAINE) [phone] MG tablet [id number] Yes Take 2 tablets by mouth daily as needed for headache.  [provider] Taking Active Self  Blood Glucose Monitoring Suppl (ONETOUCH VERIO) w/Device KIT [id number] Yes Use to check blood sugar 3 times a day and document results, bring to appointments.  Goal is <130 fasting blood sugar and <180 two hours after meals. Aura Dials T, NP Taking Active   DULoxetine (CYMBALTA) 60 MG capsule [id number] Yes Take 60 mg by mouth daily. [provider] Taking Active   famotidine (PEPCID) 20 MG tablet [id number] Yes TAKE 1 TABLET BY MOUTH EVERY DAY Cannady, Jolene T, NP Taking Active   glucose blood test strip [id number] Yes Use to check blood sugar 3 times daily, fasting in morning with goal <130 and 2 hours after meals with goal <180.  Bring blood sugar log to visits. Aura Dials T, NP Taking Active   goserelin (ZOLADEX) 3.6 MG injection [id number] Yes Inject 3.6 mg into the skin every 28 (twenty-eight) days. [provider] Taking Active            Med Note Littie Deeds, Ismerai Bin A   Mon Jun 27, 2022  3:13 PM) Every 3 months  insulin glargine (LANTUS SOLOSTAR) 100 UNIT/ML Solostar Pen [id number] Yes Inject 45 Units into the skin at bedtime. Aura Dials T, NP Taking Active   Insulin Pen Needle (NOVOFINE) 30G X 8 MM MISC [id number] Yes Inject 10 each into the skin as needed. Marjie Skiff, NP Taking Active   lamoTRIgine (  LAMICTAL) 25 MG tablet [id number] Yes TAKE 1 TABLET BY MOUTH TWICE A DAY Cannady, Jolene T, NP Taking Active   metFORMIN (GLUCOPHAGE-XR) 500 MG 24 hr tablet [id number] Yes START BY TAKING ONE TABLET (500 MG) TWICE A DAY WITH MEALS AND THEN INCREASE IN ONE WEEK TO TWO TABLETS (1000 MG) TWICE A DAY WITH MEALS. Marjie Skiff, NP Taking Active            Med Note Littie Deeds, Marylan Glore A   Mon Jun 27, 2022  3:11 PM) 2 BID  naloxone [facility]) nasal  spray 4 mg/0.1 mL [id number] Yes Place 1 spray into the nose as needed for up to 365 doses (for opioid-induced respiratory depresssion). In case of emergency (overdose), spray once into each nostril. If no response within 3 minutes, repeat application and call 911. Delano Metz, MD Taking Active            Med Note Littie Deeds, [facility] A   Mon Jun 27, 2022  3:12 PM) prn  ondansetron (ZOFRAN) 8 MG tablet [id number] Yes Take 1 tablet (8 mg total) by mouth 2 (two) times daily as needed for nausea or vomiting. Aura Dials T, NP Taking Active   oxyCODONE (OXY IR/ROXICODONE) 5 MG immediate release tablet [id number] Yes Take 1 tablet (5 mg total) by mouth 2 (two) times daily as needed for severe pain. Must last 30 days Delano Metz, MD Taking Active   oxyCODONE (OXY IR/ROXICODONE) 5 MG immediate release tablet [id number] Yes Take 1 tablet (5 mg total) by mouth 2 (two) times daily as needed for severe pain. Must last 30 days Delano Metz, MD Taking Active   oxyCODONE (OXY IR/ROXICODONE) 5 MG immediate release tablet [id number] Yes Take 1 tablet (5 mg total) by mouth 2 (two) times daily as needed for severe pain. Must last 30 days Delano Metz, MD Taking Active            Med Note Laban Emperor, [place] A   Wed May 25, 2022  3:02 PM) WARNING: Future prescription, NOT a DUPLICATE. DO NOT DELETE. Do not delete during hospital medication reconciliation or at discharge. DO NOT LABEL as "Patient not taking".  pregabalin (LYRICA) 150 MG capsule [id number] Yes TAKE 1 CAPSULE BY MOUTH TWICE A DAY Cannady, Jolene T, NP Taking Active   rosuvastatin (CRESTOR) 40 MG tablet [id number] Yes TAKE 1 TABLET (40 MG TOTAL) BY MOUTH DAILY. STOP TAKING ATORVASTATIN. Aura Dials T, NP Taking Active   sitaGLIPtin (JANUVIA) 100 MG tablet [id number] No Take 1 tablet (100 mg total) by mouth daily.  Patient not taking: Reported on 06/27/2022   Marjie Skiff, NP Not Taking Active            Med Note Littie Deeds, Lawerance Matsuo A    Mon Jun 27, 2022  3:15 PM) cost  SUMAtriptan (IMITREX) 100 MG tablet [id number] Yes TAKE 1 TABLET BY MOUTH AS NEEDED FOR MIGRAINE. MAY REPEAT IN 2 HOURS IF HEADACHE PERSISTS OR RECURS AS DIRECTED. Aura Dials T, NP Taking Active   tamoxifen (NOLVADEX) 10 MG tablet [id number] Yes TAKE 1 TABLET BY MOUTH EVERY DAY Earna Coder, MD Taking Active   Vitamin D, Ergocalciferol, (DRISDOL) 1.25 MG (50000 UNIT) CAPS capsule [id number] Yes TAKE 1 CAPSULE BY MOUTH ONE TIME PER WEEK Earna Coder, MD Taking Active   Med List Note Earlyne Iba, RN 05/25/22 1506): MR 08/28/22 UDS 08/31/21 Medication agreement signed 02-14-18             Assessment/Plan:   Diabetes: -  Currently uncontrolled - Obtained manufacturer copay card for Januvia and provided to [facility]; they were able to process one month for $5 - Patient informed, and I recommended that she check FBG daily and record - Will follow up with her in two weeks to what what home FBG is and how she's tolerating Januvia - At this time, insurance change will have occurred; I will assess coverage of Ozempic and availability of copay card; so we can possibly initiate therapy   Follow Up Plan: Telephone visit in 2 weeks  Lenna Gilford, PharmD, DPLA

## 2022-07-11 ENCOUNTER — Other Ambulatory Visit: Payer: BLUE CROSS/BLUE SHIELD

## 2022-07-11 NOTE — Progress Notes (Signed)
   07/11/2022  Patient ID: Carolyn Roy, female   DOB: [birth year], 57 y.o.   MRN: [id number]  Outreach attempt for patient's scheduled telephone follow-up unsuccessful.  Tried to call x2 but could not reach, and voicemail was full.  Sending MyChart message and will attempt to contact again next week if I do not hear back.  Lenna Gilford, PharmD, DPLA

## 2022-07-13 ENCOUNTER — Other Ambulatory Visit: Payer: Self-pay

## 2022-07-13 ENCOUNTER — Other Ambulatory Visit: Payer: Self-pay | Admitting: Nurse Practitioner

## 2022-07-13 MED ORDER — INSULIN PEN NEEDLE 30G X 8 MM MISC: 4 refills | Status: DC

## 2022-07-13 NOTE — Progress Notes (Signed)
   07/13/2022  Patient ID: Carolyn Roy, female   DOB: 11-19-1965, 57 y.o.   MRN: [id number]  MyChart message from patient requesting refill on insulin pen needles for Lantus administration.  Based on chart, last prescription was sent April 2023; so prescription has expired at retail pharmacy.  Pending refill for PCP to sign if in agreement.  Lenna Gilford, PharmD, DPLA

## 2022-07-14 NOTE — Telephone Encounter (Signed)
Requested medication (s) are due for refill today: alternative requested  Requested medication (s) are on the active medication list: yes  Last refill:  07/13/22  Future visit scheduled: yes  Notes to clinic:   Pharmacy comment: Alternative Requested:DONT CARRY THIS SIZE IS ANYONE WE HAVE IN STOCK OK.     Requested Prescriptions  Pending Prescriptions Disp Refills   BD PEN NEEDLE NANO 2ND GEN 32G X 4 MM MISC [Pharmacy Med Name: BD NANO 2 GEN PEN NDL 32G 4MM]  0     There is no refill protocol information for this order

## 2022-08-02 ENCOUNTER — Other Ambulatory Visit: Payer: Self-pay

## 2022-08-02 DIAGNOSIS — Z1231 Encounter for screening mammogram for malignant neoplasm of breast: Secondary | ICD-10-CM

## 2022-08-03 ENCOUNTER — Encounter: Payer: Self-pay | Admitting: Internal Medicine

## 2022-08-09 ENCOUNTER — Encounter: Payer: Self-pay | Admitting: Nurse Practitioner

## 2022-08-09 ENCOUNTER — Other Ambulatory Visit: Payer: Self-pay | Admitting: Internal Medicine

## 2022-08-12 ENCOUNTER — Encounter: Payer: Self-pay | Admitting: Internal Medicine

## 2022-08-19 ENCOUNTER — Encounter: Payer: Self-pay | Admitting: Internal Medicine

## 2022-08-19 ENCOUNTER — Inpatient Hospital Stay: Payer: Commercial Managed Care - PPO | Attending: Internal Medicine | Admitting: Internal Medicine

## 2022-08-19 VITALS — BP 126/82 | HR 96 | Resp 18 | Ht 64.0 in | Wt 206.0 lb

## 2022-08-19 DIAGNOSIS — Z79811 Long term (current) use of aromatase inhibitors: Secondary | ICD-10-CM | POA: Diagnosis not present

## 2022-08-19 DIAGNOSIS — G43009 Migraine without aura, not intractable, without status migrainosus: Secondary | ICD-10-CM | POA: Diagnosis not present

## 2022-08-19 DIAGNOSIS — G620 Drug-induced polyneuropathy: Secondary | ICD-10-CM

## 2022-08-19 DIAGNOSIS — Z170 Estrogen receptor positive status [ER+]: Secondary | ICD-10-CM | POA: Insufficient documentation

## 2022-08-19 DIAGNOSIS — T451X5A Adverse effect of antineoplastic and immunosuppressive drugs, initial encounter: Secondary | ICD-10-CM

## 2022-08-19 DIAGNOSIS — Z5111 Encounter for antineoplastic chemotherapy: Secondary | ICD-10-CM | POA: Diagnosis present

## 2022-08-19 DIAGNOSIS — C50512 Malignant neoplasm of lower-outer quadrant of left female breast: Secondary | ICD-10-CM | POA: Diagnosis present
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
## 2022-08-21 NOTE — Patient Instructions (Signed)
____________________________________________________________________________________________  Opioid Pain Medication Update  To: All patients taking opioid pain medications. (I.e.: hydrocodone, hydromorphone, oxycodone, oxymorphone, morphine, codeine, methadone, tapentadol, tramadol, buprenorphine, fentanyl, etc.)  Re: Updated review of side effects and adverse reactions of opioid analgesics, as well as new information about long term effects of this class of medications.  Direct risks of long-term opioid therapy are not limited to opioid addiction and overdose. Potential medical risks include serious fractures, breathing problems during sleep, hyperalgesia, immunosuppression, chronic constipation, bowel obstruction, myocardial infarction, and tooth decay secondary to xerostomia.  Unpredictable adverse effects that can occur even if you take your medication correctly: Cognitive impairment, respiratory depression, and death. Most people think that if they take their medication "correctly", and "as instructed", that they will be safe. Nothing could be farther from the truth. In reality, a significant amount of recorded deaths associated with the use of opioids has occurred in individuals that had taken the medication for a long time, and were taking their medication correctly. The following are examples of how this can happen: Patient taking his/her medication for a long time, as instructed, without any side effects, is given a certain antibiotic or another unrelated medication, which in turn triggers a "Drug-to-drug interaction" leading to disorientation, cognitive impairment, impaired reflexes, respiratory depression or an untoward event leading to serious bodily harm or injury, including death.  Patient taking his/her medication for a long time, as instructed, without any side effects, develops an acute impairment of liver and/or kidney function. This will lead to a rapid inability of the body to  breakdown and eliminate their pain medication, which will result in effects similar to an "overdose", but with the same medicine and dose that they had always taken. This again may lead to disorientation, cognitive impairment, impaired reflexes, respiratory depression or an untoward event leading to serious bodily harm or injury, including death.  A similar problem will occur with patients as they grow older and their liver and kidney function begins to decrease as part of the aging process.  Background information: Historically, the original case for using long-term opioid therapy to treat chronic noncancer pain was based on safety assumptions that subsequent experience has called into question. In 1996, the American Pain Society and the American Academy of Pain Medicine issued a consensus statement supporting long-term opioid therapy. This statement acknowledged the dangers of opioid prescribing but concluded that the risk for addiction was low; respiratory depression induced by opioids was short-lived, occurred mainly in opioid-naive patients, and was antagonized by pain; tolerance was not a common problem; and efforts to control diversion should not constrain opioid prescribing. This has now proven to be wrong. Experience regarding the risks for opioid addiction, misuse, and overdose in community practice has failed to support these assumptions.  According to the Centers for Disease Control and Prevention, fatal overdoses involving opioid analgesics have increased sharply over the past decade. Currently, more than 96,700 people die from drug overdoses every year. Opioids are a factor in 7 out of every 10 overdose deaths. Deaths from drug overdose have surpassed motor vehicle accidents as the leading cause of death for individuals between the ages of 35 and 54.  Clinical data suggest that neuroendocrine dysfunction may be very common in both men and women, potentially causing hypogonadism, erectile  dysfunction, infertility, decreased libido, osteoporosis, and depression. Recent studies linked higher opioid dose to increased opioid-related mortality. Controlled observational studies reported that long-term opioid therapy may be associated with increased risk for cardiovascular events. Subsequent meta-analysis concluded   that the safety of long-term opioid therapy in elderly patients has not been proven.   Side Effects and adverse reactions: Common side effects: Drowsiness (sedation). Dizziness. Nausea and vomiting. Constipation. Physical dependence -- Dependence often manifests with withdrawal symptoms when opioids are discontinued or decreased. Tolerance -- As you take repeated doses of opioids, you require increased medication to experience the same effect of pain relief. Respiratory depression -- This can occur in healthy people, especially with higher doses. However, people with COPD, asthma or other lung conditions may be even more susceptible to fatal respiratory impairment.  Uncommon side effects: An increased sensitivity to feeling pain and extreme response to pain (hyperalgesia). Chronic use of opioids can lead to this. Delayed gastric emptying (the process by which the contents of your stomach are moved into your small intestine). Muscle rigidity. Immune system and hormonal dysfunction. Quick, involuntary muscle jerks (myoclonus). Arrhythmia. Itchy skin (pruritus). Dry mouth (xerostomia).  Long-term side effects: Chronic constipation. Sleep-disordered breathing (SDB). Increased risk of bone fractures. Hypothalamic-pituitary-adrenal dysregulation. Increased risk of overdose.  RISKS: Respiratory depression and death: Opioids increase the risk of respiratory depression and death.  Drug-to-drug interactions: Opioids are relatively contraindicated in combination with benzodiazepines, sleep inducers, and other central nervous system depressants. Other classes of medications  (i.e.: certain antibiotics and even over-the-counter medications) may also trigger or induce respiratory depression in some patients.  Medical conditions: Patients with pre-existing respiratory problems are at higher risk of respiratory failure and/or depression when in combination with opioid analgesics. Opioids are relatively contraindicated in some medical conditions such as central sleep apnea.   Fractures and Falls:  Opioids increase the risk and incidence of falls. This is of particular importance in elderly patients.  Endocrine System:  Long-term administration is associated with endocrine abnormalities (endocrinopathies). (Also known as Opioid-induced Endocrinopathy) Influences on both the hypothalamic-pituitary-adrenal axis?and the hypothalamic-pituitary-gonadal axis have been demonstrated with consequent hypogonadism and adrenal insufficiency in both sexes. Hypogonadism and decreased levels of dehydroepiandrosterone sulfate have been reported in men and women. Endocrine effects include: Amenorrhoea in women (abnormal absence of menstruation) Reduced libido in both sexes Decreased sexual function Erectile dysfunction in men Hypogonadisms (decreased testicular function with shrinkage of testicles) Infertility Depression and fatigue Loss of muscle mass Anxiety Depression Immune suppression Hyperalgesia Weight gain Anemia Osteoporosis Patients (particularly women of childbearing age) should avoid opioids. There is insufficient evidence to recommend routine monitoring of asymptomatic patients taking opioids in the long-term for hormonal deficiencies.  Immune System: Human studies have demonstrated that opioids have an immunomodulating effect. These effects are mediated via opioid receptors both on immune effector cells and in the central nervous system. Opioids have been demonstrated to have adverse effects on antimicrobial response and anti-tumour surveillance. Buprenorphine has  been demonstrated to have no impact on immune function.  Opioid Induced Hyperalgesia: Human studies have demonstrated that prolonged use of opioids can lead to a state of abnormal pain sensitivity, sometimes called opioid induced hyperalgesia (OIH). Opioid induced hyperalgesia is not usually seen in the absence of tolerance to opioid analgesia. Clinically, hyperalgesia may be diagnosed if the patient on long-term opioid therapy presents with increased pain. This might be qualitatively and anatomically distinct from pain related to disease progression or to breakthrough pain resulting from development of opioid tolerance. Pain associated with hyperalgesia tends to be more diffuse than the pre-existing pain and less defined in quality. Management of opioid induced hyperalgesia requires opioid dose reduction.  Cancer: Chronic opioid therapy has been associated with an increased risk of cancer   among noncancer patients with chronic pain. This association was more evident in chronic strong opioid users. Chronic opioid consumption causes significant pathological changes in the small intestine and colon. Epidemiological studies have found that there is a link between opium dependence and initiation of gastrointestinal cancers. Cancer is the second leading cause of death after cardiovascular disease. Chronic use of opioids can cause multiple conditions such as GERD, immunosuppression and renal damage as well as carcinogenic effects, which are associated with the incidence of cancers.   Mortality: Long-term opioid use has been associated with increased mortality among patients with chronic non-cancer pain (CNCP).  Prescription of long-acting opioids for chronic noncancer pain was associated with a significantly increased risk of all-cause mortality, including deaths from causes other than overdose.  Reference: Von Korff M, Kolodny A, Deyo RA, Chou R. Long-term opioid therapy reconsidered. Ann Intern Med. 2011  Sep 6;155(5):325-8. doi: 10.7326/[id number]. PMID: 21893626; PMCID: PMC3280085. Bedson J, Chen Y, Ashworth J, Hayward RA, Dunn KM, Jordan KP. Risk of adverse events in patients prescribed long-term opioids: A cohort study in the [place] Clinical Practice Research Datalink. Eur J Pain. 2019 May;23(5):908-922. doi: 10.1002/ejp.1357. Epub 2019 Jan 31. PMID: 30620116. Colameco S, Coren JS, Ciervo CA. Continuous opioid treatment for chronic noncancer pain: a time for moderation in prescribing. Postgrad Med. 2009 Jul;121(4):61-6. doi: 10.3810/pgm.2009.07.2032. PMID: 19641271. Chou R, Turner JA, Devine EB, Hansen RN, Sullivan SD, Blazina I, Dana T, Bougatsos C, Deyo RA. The effectiveness and risks of long-term opioid therapy for chronic pain: a systematic review for a National Institutes of Health Pathways to Prevention Workshop. Ann Intern Med. 2015 Feb 17;162(4):276-86. doi: 10.7326/M14-2559. PMID: 25581257. Warner M, Chen LH, Makuc DM. NCHS Data Brief No. 22. [place]: Centers for Disease Control and Prevention; 2009. Sep, Increase in Fatal Poisonings Involving Opioid Analgesics in the [place], 1999-2006. Song IA, Choi HR, Oh TK. Long-term opioid use and mortality in patients with chronic non-cancer pain: Ten-year follow-up study in [place] from 2010 through 2019. EClinicalMedicine. 2022 Jul 18;51:101558. doi: 10.1016/j.eclinm.2022.101558. PMID: 35875817; PMCID: PMC9304910. Huser, W., Schubert, T., Vogelmann, T. et al. All-cause mortality in patients with long-term opioid therapy compared with non-opioid analgesics for chronic non-cancer pain: a database study. BMC Med 18, 162 (2020). https://doi.org/10.1186/[email or web address] Rashidian H, Zendehdel K, Kamangar F, Malekzadeh R, Haghdoost AA. An Ecological Study of the Association between Opiate Use and Incidence of Cancers. Addict Health. 2016 Fall;8(4):252-260. PMID: 28819556; PMCID: PMC5554805.  Our Goal: Our goal is to control your  pain with means other than the use of opioid pain medications.  Our Recommendation: Talk to your physician about coming off of these medications. We can assist you with the tapering down and stopping these medicines. Based on the new information, even if you cannot completely stop the medication, a decrease in the dose may be associated with a lesser risk. Ask for other means of controlling the pain. Decrease or eliminate those factors that significantly contribute to your pain such as smoking, obesity, and a diet heavily tilted towards "inflammatory" nutrients.  Last Updated: 08/15/2022   ____________________________________________________________________________________________     ____________________________________________________________________________________________  Transfer of Pain Medication between Pharmacies  Re: 2023 DEA Clarification on existing regulation  Published on DEA Website: October 08, 2021  Title: Revised Regulation Allows DEA-Registered Pharmacies to Transfer Electronic Prescriptions at a Patient's Request DEA Headquarters Division - Public Information Office  "Patients now have the ability to request their electronic prescription be transferred to another pharmacy without having to go back to their practitioner to initiate the   request. This revised regulation went into effect on Monday, October 04, 2021.     At a patient's request, a DEA-registered retail pharmacy can now transfer an electronic prescription for a controlled substance (schedules II-V) to another DEA-registered retail pharmacy. Prior to this change, patients would have to go through their practitioner to cancel their prescription and have it re-issued to a different pharmacy. The process was taxing and time consuming for both patients and practitioners.    The Drug Enforcement Administration (DEA) published its intent to revise the process for transferring electronic prescriptions on December 27, 2019.  The final rule was published in the federal register on September 02, 2021 and went into effect 30 days later.  Under the final rule, a prescription can only be transferred once between pharmacies, and only if allowed under existing state or other applicable law. The prescription must remain in its electronic form; may not be altered in any way; and the transfer must be communicated directly between two licensed pharmacists. It's important to note, any authorized refills transfer with the original prescription, which means the entire prescription will be filled at the same pharmacy."    REFERENCES: 1. DEA website announcement https://www.dea.gov/stories/2023/2023-10/2021-09-01/revised-regulation-allows-dea-registered-pharmacies-transfer  2. Department of Justice website  https://www.govinfo.gov/content/pkg/FR-2021-09-02/pdf/2023-15847.pdf  3. DEPARTMENT OF JUSTICE Drug Enforcement Administration 21 CFR Part 1306 [Docket No. DEA-637] RIN [id number] "Transfer of Electronic Prescriptions for Schedules II-V Controlled Substances Between Pharmacies for Initial Filling"  ____________________________________________________________________________________________     _______________________________________________________________________  Medication Rules  Purpose: To inform patients, and their family members, of our medication rules and regulations.  Applies to: All patients receiving prescriptions from our practice (written or electronic).  Pharmacy of record: This is the pharmacy where your electronic prescriptions will be sent. Make sure we have the correct one.  Electronic prescriptions: In compliance with the [place] Strengthen Opioid Misuse Prevention (STOP) Act of 2017 (Session Law 2017-74/H243), effective February 07, 2018, all controlled substances must be electronically prescribed. Written prescriptions, faxing, or calling prescriptions to a pharmacy will no longer be  done.  Prescription refills: These will be provided only during in-person appointments. No medications will be renewed without a "face-to-face" evaluation with your provider. Applies to all prescriptions.  NOTE: The following applies primarily to controlled substances (Opioid* Pain Medications).   Type of encounter (visit): For patients receiving controlled substances, face-to-face visits are required. (Not an option and not up to the patient.)  Patient's responsibilities: Pain Pills: Bring all pain pills to every appointment (except for procedure appointments). Pill Bottles: Bring pills in original pharmacy bottle. Bring bottle, even if empty. Always bring the bottle of the most recent fill.  Medication refills: You are responsible for knowing and keeping track of what medications you are taking and when is it that you will need a refill. The day before your appointment: write a list of all prescriptions that need to be refilled. The day of the appointment: give the list to the admitting nurse. Prescriptions will be written only during appointments. No prescriptions will be written on procedure days. If you forget a medication: it will not be "Called in", "Faxed", or "electronically sent". You will need to get another appointment to get these prescribed. No early refills. Do not call asking to have your prescription filled early. Partial  or short prescriptions: Occasionally your pharmacy may not have enough pills to fill your prescription.  NEVER ACCEPT a partial fill or a prescription that is short of the total amount of pills that you were prescribed.    With controlled substances the law allows 72 hours for the pharmacy to complete the prescription.  If the prescription is not completed within 72 hours, the pharmacist will require a new prescription to be written. This means that you will be short on your medicine and we WILL NOT send another prescription to complete your original prescription.   Instead, request the pharmacy to send a carrier to a nearby branch to get enough medication to provide you with your full prescription. Prescription Accuracy: You are responsible for carefully inspecting your prescriptions before leaving our office. Have the discharge nurse carefully go over each prescription with you, before taking them home. Make sure that your name is accurately spelled, that your address is correct. Check the name and dose of your medication to make sure it is accurate. Check the number of pills, and the written instructions to make sure they are clear and accurate. Make sure that you are given enough medication to last until your next medication refill appointment. Taking Medication: Take medication as prescribed. When it comes to controlled substances, taking less pills or less frequently than prescribed is permitted and encouraged. Never take more pills than instructed. Never take the medication more frequently than prescribed.  Inform other Doctors: Always inform, all of your healthcare providers, of all the medications you take. Pain Medication from other Providers: You are not allowed to accept any additional pain medication from any other Doctor or Healthcare provider. There are two exceptions to this rule. (see below) In the event that you require additional pain medication, you are responsible for notifying us, as stated below. Cough Medicine: Often these contain an opioid, such as codeine or hydrocodone. Never accept or take cough medicine containing these opioids if you are already taking an opioid* medication. The combination may cause respiratory failure and death. Medication Agreement: You are responsible for carefully reading and following our Medication Agreement. This must be signed before receiving any prescriptions from our practice. Safely store a copy of your signed Agreement. Violations to the Agreement will result in no further prescriptions. (Additional copies of  our Medication Agreement are available upon request.) Laws, Rules, & Regulations: All patients are expected to follow all Federal and State Laws, Statutes, Rules, & Regulations. Ignorance of the Laws does not constitute a valid excuse.  Illegal drugs and Controlled Substances: The use of illegal substances (including, but not limited to marijuana and its derivatives) and/or the illegal use of any controlled substances is strictly prohibited. Violation of this rule may result in the immediate and permanent discontinuation of any and all prescriptions being written by our practice. The use of any illegal substances is prohibited. Adopted CDC guidelines & recommendations: Target dosing levels will be at or below 60 MME/day. Use of benzodiazepines** is not recommended.  Exceptions: There are only two exceptions to the rule of not receiving pain medications from other Healthcare Providers. Exception #1 (Emergencies): In the event of an emergency (i.e.: accident requiring emergency care), you are allowed to receive additional pain medication. However, you are responsible for: As soon as you are able, call our office [phone], at any time of the day or night, and leave a message stating your name, the date and nature of the emergency, and the name and dose of the medication prescribed. In the event that your call is answered by a member of our staff, make sure to document and save the date, time, and the name of the person that took your information.  Exception #2 (  Planned Surgery): In the event that you are scheduled by another doctor or dentist to have any type of surgery or procedure, you are allowed (for a period no longer than 30 days), to receive additional pain medication, for the acute post-op pain. However, in this case, you are responsible for picking up a copy of our "Post-op Pain Management for Surgeons" handout, and giving it to your surgeon or dentist. This document is available at our office,  and does not require an appointment to obtain it. Simply go to our office during business hours (Monday-Thursday from 8:00 AM to 4:00 PM) (Friday 8:00 AM to 12:00 Noon) or if you have a scheduled appointment with us, prior to your surgery, and ask for it by name. In addition, you are responsible for: calling our office [phone], at any time of the day or night, and leaving a message stating your name, name of your surgeon, type of surgery, and date of procedure or surgery. Failure to comply with your responsibilities may result in termination of therapy involving the controlled substances. Medication Agreement Violation. Following the above rules, including your responsibilities will help you in avoiding a Medication Agreement Violation ("Breaking your Pain Medication Contract").  Consequences:  Not following the above rules may result in permanent discontinuation of medication prescription therapy.  *Opioid medications include: morphine, codeine, oxycodone, oxymorphone, hydrocodone, hydromorphone, meperidine, tramadol, tapentadol, buprenorphine, fentanyl, methadone. **Benzodiazepine medications include: diazepam (Valium), alprazolam (Xanax), clonazepam (Klonopine), lorazepam (Ativan), clorazepate (Tranxene), chlordiazepoxide (Librium), estazolam (Prosom), oxazepam (Serax), temazepam (Restoril), triazolam (Halcion) (Last updated: 11/30/2021) ______________________________________________________________________    ______________________________________________________________________  Medication Recommendations and Reminders  Applies to: All patients receiving prescriptions (written and/or electronic).  Medication Rules & Regulations: You are responsible for reading, knowing, and following our "Medication Rules" document. These exist for your safety and that of others. They are not flexible and neither are we. Dismissing or ignoring them is an act of "non-compliance" that may result in  complete and irreversible termination of such medication therapy. For safety reasons, "non-compliance" will not be tolerated. As with the [place] fundamental legal principle of "ignorance of the law is no defense", we will accept no excuses for not having read and knowing the content of documents provided to you by our practice.  Pharmacy of record:  Definition: This is the pharmacy where your electronic prescriptions will be sent.  We do not endorse any particular pharmacy. It is up to you and your insurance to decide what pharmacy to use.  We do not restrict you in your choice of pharmacy. However, once we write for your prescriptions, we will NOT be re-sending more prescriptions to fix restricted supply problems created by your pharmacy, or your insurance.  The pharmacy listed in the electronic medical record should be the one where you want electronic prescriptions to be sent. If you choose to change pharmacy, simply notify our nursing staff. Changes will be made only during your regular appointments and not over the phone.  Recommendations: Keep all of your pain medications in a safe place, under lock and key, even if you live alone. We will NOT replace lost, stolen, or damaged medication. We do not accept "Police Reports" as proof of medications having been stolen. After you fill your prescription, take 1 week's worth of pills and put them away in a safe place. You should keep a separate, properly labeled bottle for this purpose. The remainder should be kept in the original bottle. Use this as your primary supply, until it runs out.   Once it's gone, then you know that you have 1 week's worth of medicine, and it is time to come in for a prescription refill. If you do this correctly, it is unlikely that you will ever run out of medicine. To make sure that the above recommendation works, it is very important that you make sure your medication refill appointments are scheduled at least 1 week before you  run out of medicine. To do this in an effective manner, make sure that you do not leave the office without scheduling your next medication management appointment. Always ask the nursing staff to show you in your prescription , when your medication will be running out. Then arrange for the receptionist to get you a return appointment, at least 7 days before you run out of medicine. Do not wait until you have 1 or 2 pills left, to come in. This is very poor planning and does not take into consideration that we may need to cancel appointments due to bad weather, sickness, or emergencies affecting our staff. DO NOT ACCEPT A "Partial Fill": If for any reason your pharmacy does not have enough pills/tablets to completely fill or refill your prescription, do not allow for a "partial fill". The law allows the pharmacy to complete that prescription within 72 hours, without requiring a new prescription. If they do not fill the rest of your prescription within those 72 hours, you will need a separate prescription to fill the remaining amount, which we will NOT provide. If the reason for the partial fill is your insurance, you will need to talk to the pharmacist about payment alternatives for the remaining tablets, but again, DO NOT ACCEPT A PARTIAL FILL, unless you can trust your pharmacist to obtain the remainder of the pills within 72 hours.  Prescription refills and/or changes in medication(s):  Prescription refills, and/or changes in dose or medication, will be conducted only during scheduled medication management appointments. (Applies to both, written and electronic prescriptions.) No refills on procedure days. No medication will be changed or started on procedure days. No changes, adjustments, and/or refills will be conducted on a procedure day. Doing so will interfere with the diagnostic portion of the procedure. No phone refills. No medications will be "called into the pharmacy". No Fax refills. No weekend  refills. No Holliday refills. No after hours refills.  Remember:  Business hours are:  Monday to Thursday 8:00 AM to 4:00 PM Provider's Schedule: Tasha Jindra, MD - Appointments are:  Medication management: Monday and Wednesday 8:00 AM to 4:00 PM Procedure day: Tuesday and Thursday 7:30 AM to 4:00 PM Bilal Lateef, MD - Appointments are:  Medication management: Tuesday and Thursday 8:00 AM to 4:00 PM Procedure day: Monday and Wednesday 7:30 AM to 4:00 PM (Last update: 11/30/2021) ______________________________________________________________________   ____________________________________________________________________________________________  Naloxone Nasal Spray  Why am I receiving this medication? [place] STOP ACT requires that all patients taking high dose opioids or at risk of opioids respiratory depression, be prescribed an opioid reversal agent, such as Naloxone (AKA: Narcan).  What is this medication? NALOXONE (nal OX one) treats opioid overdose, which causes slow or shallow breathing, severe drowsiness, or trouble staying awake. Call emergency services after using this medication. You may need additional treatment. Naloxone works by reversing the effects of opioids. It belongs to a group of medications called opioid blockers.  COMMON BRAND NAME(S): Kloxxado, Narcan  What should I tell my care team before I take this medication? They need to know if you have   any of these conditions: Heart disease Substance use disorder An unusual or allergic reaction to naloxone, other medications, foods, dyes, or preservatives Pregnant or trying to get pregnant Breast-feeding  When to use this medication? This medication is to be used for the treatment of respiratory depression (less than 8 breaths per minute) secondary to opioid overdose.   How to use this medication? This medication is for use in the nose. Lay the person on their back. Support their neck with your hand  and allow the head to tilt back before giving the medication. The nasal spray should be given into 1 nostril. After giving the medication, move the person onto their side. Do not remove or test the nasal spray until ready to use. Get emergency medical help right away after giving the first dose of this medication, even if the person wakes up. You should be familiar with how to recognize the signs and symptoms of a narcotic overdose. If more doses are needed, give the additional dose in the other nostril. Talk to your care team about the use of this medication in children. While this medication may be prescribed for children as young as newborns for selected conditions, precautions do apply.  Naloxone Overdosage: If you think you have taken too much of this medicine contact a poison control center or emergency room at once.  NOTE: This medicine is only for you. Do not share this medicine with others.  What if I miss a dose? This does not apply.  What may interact with this medication? This is only used during an emergency. No interactions are expected during emergency use. This list may not describe all possible interactions. Give your health care provider a list of all the medicines, herbs, non-prescription drugs, or dietary supplements you use. Also tell them if you smoke, drink alcohol, or use illegal drugs. Some items may interact with your medicine.  What should I watch for while using this medication? Keep this medication ready for use in the case of an opioid overdose. Make sure that you have the phone number of your care team and local hospital ready. You may need to have additional doses of this medication. Each nasal spray contains a single dose. Some emergencies may require additional doses. After use, bring the treated person to the nearest hospital or call 911. Make sure the treating care team knows that the person has received a dose of this medication. You will receive additional  instructions on what to do during and after use of this medication before an emergency occurs.  What side effects may I notice from receiving this medication? Side effects that you should report to your care team as soon as possible: Allergic reactions--skin rash, itching, hives, swelling of the face, lips, tongue, or throat Side effects that usually do not require medical attention (report these to your care team if they continue or are bothersome): Constipation Dryness or irritation inside the nose Headache Increase in blood pressure Muscle spasms Stuffy nose Toothache This list may not describe all possible side effects. Call your doctor for medical advice about side effects. You may report side effects to FDA at 1-800-FDA-1088.  Where should I keep my medication? Because this is an emergency medication, you should keep it with you at all times.  Keep out of the reach of children and pets. Store between 20 and 25 degrees C (68 and 77 degrees F). Do not freeze. Throw away any unused medication after the expiration date. Keep in original box   until ready to use.  NOTE: This sheet is a summary. It may not cover all possible information. If you have questions about this medicine, talk to your doctor, pharmacist, or health care provider.   2023 [employer] Standard (2020-10-02 00:00:00)  ____________________________________________________________________________________________   

## 2022-08-21 NOTE — Progress Notes (Signed)
PROVIDER NOTE: Information contained herein reflects review and annotations entered in association with encounter. Interpretation of such information and data should be left to medically-trained personnel. Information provided to patient can be located elsewhere in the medical record under "Patient Instructions". Document created using STT-dictation technology, any transcriptional errors that may result from process are unintentional.    Patient: Carolyn Roy  Service Category: E/M  Provider: Oswaldo Done, MD  DOB: 1965-11-20  DOS: 08/22/2022  Referring Provider: Marjie Skiff, NP  MRN: [id number]  Specialty: Interventional Pain Management  PCP: Marjie Skiff, NP  Type: Established Patient  Setting: Ambulatory outpatient    Location: Office  Delivery: Face-to-face     HPI  [name], a 57 y.o. year old female, is here today because of her Chronic pain syndrome [G89.4]. Ms. Forquer primary complain today is Knee Pain (right)  Pertinent problems: Ms. Slape has Chronic fatigue; Family history of breast cancer; Chemotherapy-induced neuropathy (HCC); Malignant neoplasm of lower-outer quadrant of left breast of female, estrogen receptor positive (HCC); Chronic feet pain (1ry area of Pain) (Bilateral) (R>L); Neuropathic pain of feet (Bilateral); Chronic knee pain (2ry area of Pain) (Bilateral) (R>L); Chronic hand pain (3ry area of Pain) (Bilateral) (R>L); Chronic pain syndrome; Chronic hip pain (4th area of Pain) (Bilateral) (R>L); Chronic upper extremity pain (3ry area of Pain) (Bilateral) (R>L); Cancer-related pain; Neuropathic pain; Osteoarthritis of knee (Right); Chronic low back pain (Bilateral (L>R) w/o sciatica; Chronic hip pain (Left); Lumbar facet syndrome (Bilateral) (L>R); Idiopathic scoliosis; Chronic sacroiliac joint pain (Left); Chronic pain of knee (Right); Abnormal MRI, lumbar spine (04/20/2018); Lumbar facet arthropathy; Spondylosis without myelopathy or  radiculopathy, lumbosacral region; DDD (degenerative disc disease), lumbosacral; Numbness and tingling of upper extremity (C6/C7 dermatomes) (Right); Personal history of breast cancer; Cervicalgia; Cervical radiculitis (Right); Effusion of knee joint (Right); Tricompartment osteoarthritis of knee (Right); 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 Pain Assessment: Severity of Chronic pain is reported as a 5 /10. Location: Knee Right/side side of leg to back, denies back pain. Onset: More than a month ago. Quality: Aching, Discomfort, Dull, Burning. Timing: Intermittent. Modifying factor(s): rest, ice, pool. Vitals:  height is 5\' 3"  (1.6 m) and weight is 206 lb (93.4 kg). Her temperature is 97.2 F (36.2 C) (abnormal). Her blood pressure is 122/76 and her pulse is 91. Her respiration is 16 and oxygen saturation is 97%.  BMI: Estimated body mass index is 36.49 kg/m as calculated from the following:   Height as of this encounter: 5\' 3"  (1.6 m).   Weight as of this encounter: 206 lb (93.4 kg). Last encounter: 05/25/2022. Last procedure: Visit date not found.  Reason for encounter: medication management.  The patient indicates doing well with the current medication regimen. No adverse reactions or side effects reported to the medications.   Routine UDS ordered today.   RTCB: 11/26/2022   Pharmacotherapy Assessment  Analgesic: Oxycodone IR 5 mg, 1 tab PO BID (10 mg/day of oxycodone) (Average: 2 tabs/day = 10 mg/day of oxycodone) MME/day: 15 mg/day.   Monitoring: [place] PMP: PDMP reviewed during this encounter.       Pharmacotherapy: No side-effects or adverse reactions reported. Compliance: No problems identified. Effectiveness: Clinically acceptable.  Newman Pies, RN  08/22/2022 10:44 AM  Sign when Signing Visit Nursing Pain Medication Assessment:  Safety  precautions to be maintained throughout the  outpatient stay will include: orient to surroundings, keep bed in low position, maintain call bell within reach at all times, provide assistance with transfer out of bed and ambulation.  Medication Inspection Compliance: Pill count conducted under aseptic conditions, in front of the patient. Neither the pills nor the bottle was removed from the patient's sight at any time. Once count was completed pills were immediately returned to the patient in their original bottle.  Medication: Oxycodone IR Pill/Patch Count:  37 of 60 pills remain Pill/Patch Appearance: Markings consistent with prescribed medication Bottle Appearance: Standard pharmacy container. Clearly labeled. Filled Date: 7 / 2 / 2024 Last Medication intake:  Today    No results found for: "CBDTHCR" No results found for: "D8THCCBX" No results found for: "D9THCCBX"  UDS:  Summary  Date Value Ref Range Status  08/31/2021 Note  Final    Comment:    ==================================================================== Tox[employer] Select 13 (MW) ==================================================================== Test                             Result       Flag       Units  Drug Present and Declared for Prescription Verification   Oxycodone                      1261         EXPECTED   ng/mg creat   Oxymorphone                    662          EXPECTED   ng/mg creat   Noroxycodone                   2337         EXPECTED   ng/mg creat   Noroxymorphone                 157          EXPECTED   ng/mg creat    Sources of oxycodone are scheduled prescription medications.    Oxymorphone, noroxycodone, and noroxymorphone are expected    metabolites of oxycodone. Oxymorphone is also available as a    scheduled prescription medication.  ==================================================================== Test                      Result    Flag   Units      Ref Range   Creatinine              112               mg/dL      >=42 ==================================================================== Declared Medications:  The flagging and interpretation on this report are based on the  following declared medications.  Unexpected results may arise from  inaccuracies in the declared medications.   **Note: The testing scope of this panel includes these medications:   Oxycodone (OxyIR)   **Note: The testing scope of this panel does not include the  following reported medications:   Acetaminophen (Excedrin)  Amitriptyline (Elavil)  Aspirin (Excedrin)  Caffeine (Excedrin)  Duloxetine (Cymbalta)  Famotidine (Pepcid)  Goserelin  Insulin (Basaglar)  Linagliptin (Tradjenta)  Metformin (Glucophage)  Potassium (Klor-Con)  Pregabalin (Lyrica)  Rosuvastatin (Crestor)  Sumatriptan (Imitrex)  Tamoxifen (Nolvadex)  Vitamin D2 (Drisdol) ==================================================================== For clinical consultation, please call [phone]. ====================================================================       ROS  Constitutional: Denies  any fever or chills Gastrointestinal: No reported hemesis, hematochezia, vomiting, or acute GI distress Musculoskeletal: Denies any acute onset joint swelling, redness, loss of ROM, or weakness Neurological: No reported episodes of acute onset apraxia, aphasia, dysarthria, agnosia, amnesia, paralysis, loss of coordination, or loss of consciousness  Medication Review  DULoxetine, Insulin Pen Needle, OneTouch Verio, SUMAtriptan, Vitamin D (Ergocalciferol), aspirin-acetaminophen-caffeine, famotidine, glucose blood, goserelin, insulin glargine, metFORMIN, naloxone, ondansetron, oxyCODONE, pregabalin, rosuvastatin, sitaGLIPtin, and tamoxifen  History Review  Allergy: Ms. Chancellor is allergic to aspirin. Drug: Ms. Devin  reports no history of drug use. Alcohol:  reports no history of alcohol use. Tobacco:  reports that she has been  smoking cigarettes. She has a 11 pack-year smoking history. She has quit using smokeless tobacco.  Her smokeless tobacco use included snuff. Social: Ms. Sheller  reports that she has been smoking cigarettes. She has a 11 pack-year smoking history. She has quit using smokeless tobacco.  Her smokeless tobacco use included snuff. She reports that she does not drink alcohol and does not use drugs. Medical:  has a past medical history of Allergy, Breast cancer (HCC), Cancer (HCC) (06/15/2017), Depression, Diabetes mellitus without complication (HCC) (2017), Edema of left upper extremity, Endometriosis, Family history of breast cancer, Headache, Hyperlipidemia, Lymphedema of left arm, Ovarian mass, Personal history of chemotherapy, Personal history of radiation therapy, and Pneumonia. Surgical: Ms. Woodrum  has a past surgical history that includes Oophorectomy; Tubal ligation; Portacath placement (Right, 07/14/2017); Axillary lymph node biopsy (Left, 07/14/2017); Breast lumpectomy (Left, 01/12/2018); Breast biopsy (Left); Partial mastectomy with needle localization (Left, 01/12/2018); Sentinel node biopsy (Left, 01/12/2018); Colonoscopy with propofol (N/A, 12/27/2019); and Colonoscopy with propofol (N/A, 01/05/2022). Family: family history includes Breast cancer in an other family member; Breast cancer (age of onset: 50) in her maternal grandmother; Breast cancer (age of onset: 64) in an other family member; Breast cancer (age of onset: 58) in her maternal aunt; Cancer in her mother; Colon cancer in her maternal grandmother and mother; Diabetes in her brother; Other in her father; Pancreatitis in her brother; Prostate cancer (age of onset: 65) in her brother.  Laboratory Chemistry Profile   Renal Lab Results  Component Value Date   BUN 13 05/31/2022   CREATININE 0.92 05/31/2022   BCR 10 12/03/2021   GFRAA >60 10/21/2019   GFRNONAA >60 05/31/2022    Hepatic Lab Results  Component Value Date   AST 17 05/31/2022    ALT 13 05/31/2022   ALBUMIN 3.6 05/31/2022   ALKPHOS 76 05/31/2022   LIPASE 31 12/01/2020    Electrolytes Lab Results  Component Value Date   NA 134 (L) 05/31/2022   K 3.8 05/31/2022   CL 100 05/31/2022   CALCIUM 9.1 05/31/2022   MG 2.3 03/07/2022    Bone Lab Results  Component Value Date   VD25OH 76.3 12/03/2021    Inflammation (CRP: Acute Phase) (ESR: Chronic Phase) Lab Results  Component Value Date   CRP <1 12/21/2020   ESRSEDRATE 3 12/21/2020         Note: Above Lab results reviewed.  Recent Imaging Review  ECHOCARDIOGRAM COMPLETE    ECHOCARDIOGRAM REPORT       Patient Name:   Floyd A WHITE Date of Exam: 09/15/2021 Medical Rec #:  [id number]         Height:       64.0 in Accession #:    [id number]        Weight:       177.0 lb Date of  Birth:  [birth year]         BSA:          1.857 m Patient Age:    56 years          BP:           104/71 mmHg Patient Gender: F                 HR:           82 bpm. Exam Location:  [address]  Procedure: 2D Echo, Cardiac Doppler, Color Doppler, 3D Echo and Strain Analysis  Indications:    Z85.3 Breast cancer   History:        Patient has prior history of Echocardiogram examinations, most                 recent 06/28/2017. Breast cancer, Signs/Symptoms:H/o chemoth;                 Risk Factors:Current Smoker, Dyslipidemia and Diabetes.   Sonographer:    Samule Ohm RDCS Referring Phys: [id number] HEATHER E [place]  IMPRESSIONS   1. Left ventricular ejection fraction, by estimation, is 55 to 60%. The left ventricle has normal function. The left ventricle has no regional wall motion abnormalities. Left ventricular diastolic parameters are consistent with Grade I diastolic  dysfunction (impaired relaxation). The average left ventricular global longitudinal strain is -16.7 %. The global longitudinal strain is normal. GLS underestimated due to poor endocardial tracking.  2. Right ventricular systolic function is normal.  The right ventricular size is normal.  3. Left atrial size was mild to moderately dilated.  4. The mitral valve is normal in structure. Trivial mitral valve regurgitation. No evidence of mitral stenosis.  5. The aortic valve is tricuspid. There is mild calcification of the aortic valve. Aortic valve regurgitation is not visualized. Aortic valve sclerosis/calcification is present, without any evidence of aortic stenosis.  6. The inferior vena cava is normal in size with greater than 50% respiratory variability, suggesting right atrial pressure of 3 mmHg.  FINDINGS  Left Ventricle: Left ventricular ejection fraction, by estimation, is 55 to 60%. The left ventricle has normal function. The left ventricle has no regional wall motion abnormalities. The average left ventricular global longitudinal strain is -16.7 %.  The global longitudinal strain is normal. The left ventricular internal cavity size was normal in size. There is no left ventricular hypertrophy. Left ventricular diastolic parameters are consistent with Grade I diastolic dysfunction (impaired  relaxation).  Right Ventricle: The right ventricular size is normal. No increase in right ventricular wall thickness. Right ventricular systolic function is normal.  Left Atrium: Left atrial size was mild to moderately dilated.  Right Atrium: Right atrial size was normal in size.  Pericardium: There is no evidence of pericardial effusion.  Mitral Valve: The mitral valve is normal in structure. Trivial mitral valve regurgitation. No evidence of mitral valve stenosis.  Tricuspid Valve: The tricuspid valve is normal in structure. Tricuspid valve regurgitation is trivial. No evidence of tricuspid stenosis.  Aortic Valve: The aortic valve is tricuspid. There is mild calcification of the aortic valve. Aortic valve regurgitation is not visualized. Aortic valve sclerosis/calcification is present, without any evidence of aortic stenosis.  Pulmonic  Valve: The pulmonic valve was normal in structure. Pulmonic valve regurgitation is not visualized. No evidence of pulmonic stenosis.  Aorta: The aortic root is normal in size and structure.  Venous: The inferior vena cava is normal in size with greater than  50% respiratory variability, suggesting right atrial pressure of 3 mmHg.  IAS/Shunts: No atrial level shunt detected by color flow Doppler.    LEFT VENTRICLE PLAX 2D LVIDd:         4.80 cm   Diastology LVIDs:         3.40 cm   LV e' medial:    12.30 cm/s LV PW:         0.90 cm   LV E/e' medial:  5.8 LV IVS:        0.90 cm   LV e' lateral:   14.30 cm/s LVOT diam:     2.00 cm   LV E/e' lateral: 5.0 LV SV:         55 LV SV Index:   30        2D Longitudinal Strain LVOT Area:     3.14 cm  2D Strain GLS Avg:     -16.7 %                            3D Volume EF:                          3D EF:        58 %                          LV EDV:       126 ml                          LV ESV:       53 ml                          LV SV:        73 ml  RIGHT VENTRICLE             IVC RV S prime:     14.10 cm/s  IVC diam: 1.30 cm TAPSE (M-mode): 1.5 cm RVSP:           21.5 mmHg  LEFT ATRIUM             Index        RIGHT ATRIUM           Index LA diam:        4.20 cm 2.26 cm/m   RA Pressure: 3.00 mmHg LA Vol (A2C):   78.1 ml 42.05 ml/m  RA Area:     13.30 cm LA Vol (A4C):   60.1 ml 32.36 ml/m  RA Volume:   32.70 ml  17.61 ml/m LA Biplane Vol: 69.3 ml 37.31 ml/m  AORTIC VALVE LVOT Vmax:   81.40 cm/s LVOT Vmean:  54.900 cm/s LVOT VTI:    0.175 m   AORTA Ao Root diam: 3.30 cm Ao Asc diam:  3.50 cm  MITRAL VALVE               TRICUSPID VALVE MV Area (PHT): 2.28 cm    TR Peak grad:   18.5 mmHg MV Decel Time: 333 msec    TR Vmax:        215.00 cm/s MV E velocity: 71.30 cm/s  Estimated RAP:  3.00 mmHg MV A velocity: 61.30 cm/s  RVSP:           21.5 mmHg MV E/A  ratio:  1.16                            SHUNTS                             Systemic VTI:  0.18 m                            Systemic Diam: 2.00 cm  Arvilla Meres MD Electronically signed by Arvilla Meres MD Signature Date/Time: 09/15/2021/5:10:06 PM      Final   Note: Reviewed        Physical Exam  General appearance: Well nourished, well developed, and well hydrated. In no apparent acute distress Mental status: Alert, oriented x 3 (person, place, & time)       Respiratory: No evidence of acute respiratory distress Eyes: PERLA Vitals: BP 122/76   Pulse 91   Temp (!) 97.2 F (36.2 C)   Resp 16   Ht 5\' 3"  (1.6 m)   Wt 206 lb (93.4 kg)   LMP  (LMP Unknown) Comment: LAST PERIOD IN MAY 2019 WHEN SHE STARTED CHEMO  SpO2 97%   BMI 36.49 kg/m  BMI: Estimated body mass index is 36.49 kg/m as calculated from the following:   Height as of this encounter: 5\' 3"  (1.6 m).   Weight as of this encounter: 206 lb (93.4 kg). Ideal: Ideal body weight: 52.4 kg (115 lb 8.3 oz) Adjusted ideal body weight: 68.8 kg (151 lb 11.4 oz)  Assessment   Diagnosis Status  1. Chronic pain syndrome   2. Chronic feet pain (1ry area of Pain) (Bilateral) (R>L)   3. Chronic knee pain (2ry area of Pain) (Bilateral) (R>L)   4. Chronic hand pain (3ry area of Pain) (Bilateral) (R>L)   5. Cervicalgia   6. DDD (degenerative disc disease), lumbosacral   7. Cancer-related pain   8. Lumbar facet syndrome (Bilateral) (L>R)   9. Chronic low back pain (Bilateral (L>R) w/o sciatica   10. 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 Chronic hip pain (4th area of Pain) (Bilateral) (R>L)   12. 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 Abnormal MRI, lumbar spine (04/20/2018)   14. Pharmacologic therapy   15. Chronic use of opiate for therapeutic purpose   16. Encounter for medication management   17. Encounter for chronic pain management    Controlled Controlled Controlled   Updated Problems: No problems  updated.  Plan of Care  Problem-specific:  No problem-specific Assessment & Plan notes found for this encounter.  Ms. DESIRRE EICKHOFF has a current medication list which includes the following long-term medication(s): famotidine, lantus solostar, metformin, [START ON 08/28/2022] oxycodone, [START ON 09/27/2022] oxycodone, [START ON 10/27/2022] oxycodone, pregabalin, rosuvastatin, sitagliptin, and sumatriptan.  Pharmacotherapy (Medications Ordered): Meds ordered this encounter  Medications   oxyCODONE (OXY IR/ROXICODONE) 5 MG immediate release tablet    Sig: Take 1 tablet (5 mg total) by mouth 2 (two) times daily as needed for severe pain. Must last 30 days    Dispense:  60 tablet    Refill:  0    DO NOT: delete (not duplicate); no partial-fill (will deny script to complete), no refill request (F/U required). DISPENSE: 1 day early if closed on fill date. WARN: No CNS-depressants  within 8 hrs of med.   oxyCODONE (OXY IR/ROXICODONE) 5 MG immediate release tablet    Sig: Take 1 tablet (5 mg total) by mouth 2 (two) times daily as needed for severe pain. Must last 30 days    Dispense:  60 tablet    Refill:  0    DO NOT: delete (not duplicate); no partial-fill (will deny script to complete), no refill request (F/U required). DISPENSE: 1 day early if closed on fill date. WARN: No CNS-depressants within 8 hrs of med.   oxyCODONE (OXY IR/ROXICODONE) 5 MG immediate release tablet    Sig: Take 1 tablet (5 mg total) by mouth 2 (two) times daily as needed for severe pain. Must last 30 days    Dispense:  60 tablet    Refill:  0    DO NOT: delete (not duplicate); no partial-fill (will deny script to complete), no refill request (F/U required). DISPENSE: 1 day early if closed on fill date. WARN: No CNS-depressants within 8 hrs of med.   Orders:  Orders Placed This Encounter  Procedures   ToxASSURE Select 13 (MW), Urine    Volume: 30 ml(s). Minimum 3 ml of urine is needed. Document temperature of fresh  sample. Indications: Long term (current) use of opiate analgesic (W09.811)    Order Specific Question:   Release to patient    Answer:   Immediate   Nursing Instructions:    1). STAT: UDS required today. 2). Make sure to document all opioids and benzodiazepines taken, including time of last intake. 3). If order is entered on a procedure day, make sure sample is obtained before any medications are administered.   Follow-up plan:   Return in about 3 months (around 11/26/2022) for Eval-day (M,W), (F2F), (MM).      Interventional Therapies  Risk  Complexity Considerations:   Estimated body mass index is 30.55 kg/m as calculated from the following:   Height as of this encounter: 5\' 4"  (1.626 m).   Weight as of this encounter: 178 lb (80.7 kg). WNL   Planned  Pending:      Under consideration:   Possible bilateral lumbar facet RFA  Diagnostic lumbar sympathetic Blk  Diagnostic right genicular NB  Possible right genicular nerve RFA    Completed:   Therapeutic right IA steroid knee inj. x1 (08/18/2020) (100/100/100 x 24 hours/80-85)  Diagnostic bilateral lumbar facet Blk x2 (08/16/18) (100/100/80/90-100)  Therapeutic/palliative right knee Hyalgan inj. x5 (06/25/2019) (100/100/75 x 1 week/70)    Therapeutic  Palliative (PRN) options:   Therapeutic right IA steroid knee inj.  Diagnostic bilateral lumbar facet MBB   Therapeutic/palliative right Hyalgan knee inj.     Pharmacotherapy  Nonopioids transfer 02/03/2020: Pregabalin (Lyrica) and alpha-lipoic acid         Recent Visits Date Type Provider Dept  05/25/22 Office Visit Delano Metz, MD [facility]  Showing recent visits within past 90 days and meeting all other requirements Today's Visits Date Type Provider Dept  08/22/22 Office Visit Delano Metz, MD [facility]  Showing today's visits and meeting all other requirements Future Appointments No visits were found meeting these  conditions. Showing future appointments within next 90 days and meeting all other requirements  I discussed the assessment and treatment plan with the patient. The patient was provided an opportunity to ask questions and all were answered. The patient agreed with the plan and demonstrated an understanding of the instructions.  Patient advised to call back or seek an in-person evaluation  if the symptoms or condition worsens.  Duration of encounter: 30 minutes.  Total time on encounter, as per AMA guidelines included both the face-to-face and non-face-to-face time personally spent by the physician and/or other qualified health care professional(s) on the day of the encounter (includes time in activities that require the physician or other qualified health care professional and does not include time in activities normally performed by clinical staff). Physician's time may include the following activities when performed: Preparing to see the patient (e.g., pre-charting review of records, searching for previously ordered imaging, lab work, and nerve conduction tests) Review of prior analgesic pharmacotherapies. Reviewing PMP Interpreting ordered tests (e.g., lab work, imaging, nerve conduction tests) Performing post-procedure evaluations, including interpretation of diagnostic procedures Obtaining and/or reviewing separately obtained history Performing a medically appropriate examination and/or evaluation Counseling and educating the patient/family/caregiver Ordering medications, tests, or procedures Referring and communicating with other health care professionals (when not separately reported) Documenting clinical information in the electronic or other health record Independently interpreting results (not separately reported) and communicating results to the patient/ family/caregiver Care coordination (not separately reported)  Note by: Oswaldo Done, MD Date: 08/22/2022; Time: 10:59 AM

## 2022-08-22 ENCOUNTER — Ambulatory Visit: Payer: Commercial Managed Care - PPO | Attending: Pain Medicine | Admitting: Pain Medicine

## 2022-08-22 ENCOUNTER — Encounter: Payer: Self-pay | Admitting: Pain Medicine

## 2022-08-22 VITALS — BP 122/76 | HR 91 | Temp 97.2°F | Resp 16 | Ht 63.0 in | Wt 206.0 lb

## 2022-08-22 DIAGNOSIS — G8929 Other chronic pain: Secondary | ICD-10-CM

## 2022-08-22 DIAGNOSIS — Z79899 Other long term (current) drug therapy: Secondary | ICD-10-CM | POA: Diagnosis present

## 2022-08-22 DIAGNOSIS — M545 Low back pain, unspecified: Secondary | ICD-10-CM | POA: Diagnosis present

## 2022-08-22 DIAGNOSIS — M79641 Pain in right hand: Secondary | ICD-10-CM | POA: Diagnosis present

## 2022-08-22 DIAGNOSIS — M79671 Pain in right foot: Secondary | ICD-10-CM

## 2022-08-22 DIAGNOSIS — M25551 Pain in right hip: Secondary | ICD-10-CM | POA: Diagnosis present

## 2022-08-22 DIAGNOSIS — M47819 Spondylosis without myelopathy or radiculopathy, site unspecified: Secondary | ICD-10-CM | POA: Diagnosis present

## 2022-08-22 DIAGNOSIS — M51379 Other intervertebral disc degeneration, lumbosacral region without mention of lumbar back pain or lower extremity pain: Secondary | ICD-10-CM

## 2022-08-22 DIAGNOSIS — M2540 Effusion, unspecified joint: Secondary | ICD-10-CM

## 2022-08-22 DIAGNOSIS — G893 Neoplasm related pain (acute) (chronic): Secondary | ICD-10-CM

## 2022-08-22 DIAGNOSIS — M25552 Pain in left hip: Secondary | ICD-10-CM | POA: Insufficient documentation

## 2022-08-22 DIAGNOSIS — G894 Chronic pain syndrome: Secondary | ICD-10-CM

## 2022-08-22 DIAGNOSIS — M25562 Pain in left knee: Secondary | ICD-10-CM

## 2022-08-22 DIAGNOSIS — M5137 Other intervertebral disc degeneration, lumbosacral region: Secondary | ICD-10-CM | POA: Diagnosis present

## 2022-08-22 DIAGNOSIS — M542 Cervicalgia: Secondary | ICD-10-CM | POA: Diagnosis present

## 2022-08-22 DIAGNOSIS — Z79891 Long term (current) use of opiate analgesic: Secondary | ICD-10-CM | POA: Diagnosis present

## 2022-08-22 DIAGNOSIS — M47816 Spondylosis without myelopathy or radiculopathy, lumbar region: Secondary | ICD-10-CM

## 2022-08-22 DIAGNOSIS — M25561 Pain in right knee: Secondary | ICD-10-CM

## 2022-08-22 DIAGNOSIS — R937 Abnormal findings on diagnostic imaging of other parts of musculoskeletal system: Secondary | ICD-10-CM | POA: Diagnosis present

## 2022-08-22 DIAGNOSIS — M79642 Pain in left hand: Secondary | ICD-10-CM | POA: Diagnosis present

## 2022-08-22 DIAGNOSIS — M4316 Spondylolisthesis, lumbar region: Secondary | ICD-10-CM

## 2022-08-22 DIAGNOSIS — M79672 Pain in left foot: Secondary | ICD-10-CM | POA: Insufficient documentation

## 2022-08-22 MED ORDER — OXYCODONE HCL 5 MG PO TABS
5.0000 mg | ORAL_TABLET | Freq: Two times a day (BID) | ORAL | 0 refills | Status: DC | PRN
Start: 2022-10-27 — End: 2022-11-13

## 2022-08-22 MED ORDER — OXYCODONE HCL 5 MG PO TABS
5.0000 mg | ORAL_TABLET | Freq: Two times a day (BID) | ORAL | 0 refills | Status: DC | PRN
Start: 2022-09-27 — End: 2022-11-13

## 2022-08-22 MED ORDER — OXYCODONE HCL 5 MG PO TABS
5.0000 mg | ORAL_TABLET | Freq: Two times a day (BID) | ORAL | 0 refills | Status: DC | PRN
Start: 2022-08-28 — End: 2022-11-13

## 2022-08-22 NOTE — Progress Notes (Signed)
Nursing Pain Medication Assessment:  Safety precautions to be maintained throughout the outpatient stay will include: orient to surroundings, keep bed in low position, maintain call bell within reach at all times, provide assistance with transfer out of bed and ambulation.  Medication Inspection Compliance: Pill count conducted under aseptic conditions, in front of the patient. Neither the pills nor the bottle was removed from the patient's sight at any time. Once count was completed pills were immediately returned to the patient in their original bottle.  Medication: Oxycodone IR Pill/Patch Count:  37 of 60 pills remain Pill/Patch Appearance: Markings consistent with prescribed medication Bottle Appearance: Standard pharmacy container. Clearly labeled. Filled Date: 7 / 2 / 2024 Last Medication intake:  Today

## 2022-08-25 LAB — TOXASSURE SELECT 13 (MW), URINE

## 2022-08-30 ENCOUNTER — Inpatient Hospital Stay: Payer: Commercial Managed Care - PPO

## 2022-08-30 ENCOUNTER — Encounter: Payer: Self-pay | Admitting: Internal Medicine

## 2022-08-30 ENCOUNTER — Inpatient Hospital Stay (HOSPITAL_BASED_OUTPATIENT_CLINIC_OR_DEPARTMENT_OTHER): Payer: Commercial Managed Care - PPO | Admitting: Internal Medicine

## 2022-08-30 ENCOUNTER — Other Ambulatory Visit: Payer: Self-pay | Admitting: Nurse Practitioner

## 2022-08-30 VITALS — BP 108/77 | HR 95 | Temp 98.7°F | Resp 19 | Wt 204.1 lb

## 2022-08-30 DIAGNOSIS — Z170 Estrogen receptor positive status [ER+]: Secondary | ICD-10-CM | POA: Diagnosis not present

## 2022-08-30 DIAGNOSIS — Z1231 Encounter for screening mammogram for malignant neoplasm of breast: Secondary | ICD-10-CM

## 2022-08-30 DIAGNOSIS — C50512 Malignant neoplasm of lower-outer quadrant of left female breast: Secondary | ICD-10-CM | POA: Diagnosis not present

## 2022-08-30 DIAGNOSIS — Z5111 Encounter for antineoplastic chemotherapy: Secondary | ICD-10-CM | POA: Diagnosis not present

## 2022-08-30 LAB — CBC WITH DIFFERENTIAL (CANCER CENTER ONLY)
Abs Immature Granulocytes: 0.02 10*3/uL (ref 0.00–0.07)
Basophils Absolute: 0 10*3/uL (ref 0.0–0.1)
Basophils Relative: 1 %
Eosinophils Absolute: 0 10*3/uL (ref 0.0–0.5)
Eosinophils Relative: 0 %
HCT: 38.1 % (ref 36.0–46.0)
Hemoglobin: 12.2 g/dL (ref 12.0–15.0)
Immature Granulocytes: 0 %
Lymphocytes Relative: 25 %
Lymphs Abs: 2.1 10*3/uL (ref 0.7–4.0)
MCH: 31 pg (ref 26.0–34.0)
MCHC: 32 g/dL (ref 30.0–36.0)
MCV: 96.7 fL (ref 80.0–100.0)
Monocytes Absolute: 0.4 10*3/uL (ref 0.1–1.0)
Monocytes Relative: 5 %
Neutro Abs: 6 10*3/uL (ref 1.7–7.7)
Neutrophils Relative %: 69 %
Platelet Count: 164 10*3/uL (ref 150–400)
RBC: 3.94 MIL/uL (ref 3.87–5.11)
RDW: 14.7 % (ref 11.5–15.5)
WBC Count: 8.6 10*3/uL (ref 4.0–10.5)
nRBC: 0 % (ref 0.0–0.2)

## 2022-08-30 LAB — CMP (CANCER CENTER ONLY)
ALT: 17 U/L (ref 0–44)
AST: 22 U/L (ref 15–41)
Albumin: 3.5 g/dL (ref 3.5–5.0)
Alkaline Phosphatase: 57 U/L (ref 38–126)
Anion gap: 9 (ref 5–15)
BUN: 15 mg/dL (ref 6–20)
CO2: 23 mmol/L (ref 22–32)
Calcium: 9.1 mg/dL (ref 8.9–10.3)
Chloride: 106 mmol/L (ref 98–111)
Creatinine: 1.11 mg/dL — ABNORMAL HIGH (ref 0.44–1.00)
GFR, Estimated: 59 mL/min — ABNORMAL LOW (ref >60)
Glucose, Bld: 159 mg/dL — ABNORMAL HIGH (ref 70–99)
Potassium: 3.6 mmol/L (ref 3.5–5.1)
Sodium: 138 mmol/L (ref 135–145)
Total Bilirubin: 0.4 mg/dL (ref 0.3–1.2)
Total Protein: 6.8 g/dL (ref 6.5–8.1)

## 2022-08-30 MED ORDER — LETROZOLE 2.5 MG PO TABS: 2.5000 mg | ORAL_TABLET | Freq: Every day | ORAL | 3 refills | Status: DC

## 2022-08-30 MED ORDER — HEPARIN SOD (PORK) LOCK FLUSH 100 UNIT/ML IV SOLN
INTRAVENOUS | Status: AC
  Filled 2022-08-30: qty 5

## 2022-08-30 MED ORDER — SODIUM CHLORIDE 0.9 % IV SOLN
Freq: Once | INTRAVENOUS | Status: AC
  Filled 2022-08-30: qty 250

## 2022-08-30 MED ORDER — GOSERELIN ACETATE 10.8 MG ~~LOC~~ IMPL
10.8000 mg | DRUG_IMPLANT | Freq: Once | SUBCUTANEOUS | Status: AC
  Administered 2022-08-30: 10.8 mg via SUBCUTANEOUS
  Filled 2022-08-30: qty 10.8

## 2022-08-30 MED ORDER — ZOLEDRONIC ACID 4 MG/5ML IV CONC: 4.0000 mg | Freq: Once | INTRAVENOUS | Status: DC

## 2022-08-30 MED ORDER — ZOLEDRONIC ACID 4 MG/100ML IV SOLN
4.0000 mg | Freq: Once | INTRAVENOUS | Status: AC
  Administered 2022-08-30: 4 mg via INTRAVENOUS
  Filled 2022-08-30: qty 100

## 2022-08-30 NOTE — Assessment & Plan Note (Addendum)
#   May 2019- Breast cancer Stage III ER/PR pos her 2 NEG-Adjuvant Zometa q6M [June 2022 x3 years]; Zoladex today q3M.  Mammogram August 2023 [Dr.Piscoya]- WNL.   Most recently on Tamoxifen+ Zoldadex.  No clinical evidence of recurrence; stable.  # however, OFF Currently OFF Tamoxifen 10 mg [lower dose; previous intolerance to 20 mg/AI- ]- secondary to joint pains. Discontinue Tamoxifen.   # START Letrozole 2.5 mgq day. monitor closley the [facility] AEs.   # Mild Anemia- Hb 12.3- s/p PO gentle iron. Hx of colonoscopy- 2022 ? S/p colonoscopy [nov 2023;  Dr.Vanga. OCT 2023- 133; sat-19%.; continue linzess- stable   # Depression/ Insomnia--stable On cymblata/Amiytrplitine qhs /Dr.vaslow[DI-none as per uptodate*] Dr.kapur]- stable   # Bone health- DEC 2021- BMD T score: 0.3. Joint pains-multifactorial- Improved off Lyrica [As per pt ]- improved of tamoxifen- monitor closley on Letrozole. Check vit D levels.   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   # Lymphedema-left chest walls/p physical therapy- stable.   # DM-2 on insulin- poorly controlled  [A1c-6.8] Blood glucose - stable.   # Port malfunction: flush; s/p TPA-- # port/IV access- Stable Zometa q 49m; zola 3 m # DISPOSITION: # Zoladex today; Zometa .  # Follow up in 3 month;  MD;port/ cbc/cmp;iron; ferritin- ca-27-29; vit D 25 OH; Zoladex-  Dr.B

## 2022-08-30 NOTE — Progress Notes (Signed)
[facility] CONSULT NOTE  Patient Care Team: Marjie Skiff, NP as PCP - General (Nurse Practitioner) Meriam Sprague, MD as PCP - Cardiology (Cardiology) [name], Merrily Pew, MD (General Surgery) Benita Gutter, RN as Registered Nurse Sonia Side, Philbert Riser, [name] (Obstetrics and Gynecology) Earna Coder, MD as Consulting Physician (Oncology) Lyndle Herrlich, MD as Consulting Physician (Orthopedic Surgery)  CHIEF COMPLAINTS/PURPOSE OF CONSULTATION: Breast cancer  Oncology History Overview Note  # MAY 2019-  clinical stage IIIA (T3N1Mx) left breast cancer s/p biopsy on 06/14/2017. -Pathology revealed grade III invasive ductal carcinoma. -Axillary FNA revealed malignant cells c/w metastatic carcinoma. Tumor was ER + (90%), PR + (30%), Her2/neu - and Ki67 70%.  CA27.29 was 7.8 on 06/14/2017.  # She received 4 cycles of AC with Neulasta support (07/20/2017 - 08/31/2017).;  neoadjuvant Taxol on 09/14/2017.  #DEC 2019- Lumpectomy/sentinel lymph node biopsy [Dr.[name]]-complete pathologic response  # s/p RT [delayed sec to wound infection; Dr.[name]] finished RT [4/12]  # April 14th 2020- START TAM; stopped in mid-May secondary intolerance [severe migraines].  # 18th May 2020-start Arimidex [hormonal profile-postmenopausal;add Zoladex q3M]; STOPPED sec intolerance/joint pain; NOV 2021- STARTED AROMASIN; Stopped x 2 months sec to extreme fatigue/severe joint pains.   # July 2022- START tamoxifen 10 mg a day; Stopped Tamoxifen June 2024.   # JULY 23rd, 2024- START Letrozole   # PN-2 sec to taxol Ottis Stain management/ # may 2019- Endometrial sampling [Dr. Secord/Berchuck]-negative for malignancy/ # DM-2- poorly controlled.   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   # PAP SMEAR- RECOMMENDED 2022- summer  -------------------------------------------  DIAGNOSIS: left breast  cancer  STAGE:  III       ;GOALS: cure      Cancer of midline of breast, left (HCC) (Resolved)  06/15/2017 Initial Diagnosis   Cancer of midline of breast, left (HCC)   07/20/2017 - 11/16/2017 Chemotherapy   The patient had dexamethasone (DECADRON) 4 MG tablet, 1 of 1 cycle, Start date: --, End date: -- DOXOrubicin (ADRIAMYCIN) chemo injection 122 mg, 60 mg/m2 = 122 mg, Intravenous,  Once, 4 of 4 cycles Administration: 122 mg (07/20/2017), 122 mg (08/03/2017), 122 mg (08/17/2017), 122 mg (08/31/2017) palonosetron (ALOXI) injection 0.25 mg, 0.25 mg, Intravenous,  Once, 4 of 4 cycles Administration: 0.25 mg (07/20/2017), 0.25 mg (08/03/2017), 0.25 mg (08/17/2017), 0.25 mg (08/31/2017) pegfilgrastim (NEULASTA) injection 6 mg, 6 mg, Subcutaneous, Once, 5 of 5 cycles Administration: 6 mg (07/21/2017), 6 mg (08/04/2017), 6 mg (08/18/2017), 6 mg (09/01/2017) cyclophosphamide (CYTOXAN) 1,220 mg in sodium chloride 0.9 % 250 mL chemo infusion, 600 mg/m2 = 1,220 mg, Intravenous,  Once, 4 of 4 cycles Administration: 1,220 mg (07/20/2017), 1,220 mg (08/03/2017), 1,220 mg (08/17/2017), 1,220 mg (08/31/2017) PACLitaxel (TAXOL) 162 mg in sodium chloride 0.9 % 250 mL chemo infusion (</= 80mg /m2), 80 mg/m2 = 162 mg, Intravenous,  Once, 10 of 12 cycles Dose modification: 65 mg/m2 (original dose 80 mg/m2, Cycle 13, Reason: Provider Judgment, Comment: neuropathy) Administration: 162 mg (09/14/2017), 162 mg (09/21/2017), 162 mg (09/28/2017), 162 mg (10/05/2017), 162 mg (10/12/2017), 162 mg (10/19/2017), 162 mg (10/26/2017), 132 mg (11/02/2017), 132 mg (11/09/2017), 132 mg (11/16/2017) fosaprepitant (EMEND) 150 mg, dexamethasone (DECADRON) 12 mg in sodium chloride 0.9 % 145 mL IVPB, , Intravenous,  Once, 4 of 4 cycles Administration:  (07/20/2017),  (08/03/2017),  (08/17/2017),  (08/31/2017)  for chemotherapy treatment.    Malignant neoplasm of lower-outer quadrant of left breast  of female, estrogen receptor positive (HCC)  11/08/2017 Initial  Diagnosis   Malignant neoplasm of lower-outer quadrant of left breast of female, estrogen receptor positive (HCC)    HISTORY OF PRESENTING ILLNESS: Ambulating independently.  Alone.  Carolyn Roy 57 y.o.  female with a history of stage III ER PR positive HER-2/neu negative breast cancer currently on tamoxifen 10 mg a day [intolerance] plus Zoladexq3 /Zometa every 6 months is here for follow-up.   Stopped tamoxifen because of joint pains. Complains of hot flashes- milder since off tamoxifen.    Patient denies any new lumps or bumps.  Appetite is good.  No weight loss. Patient has been started on amitriptyline; by neurology.  She continues to be on Cymbalta.    Review of Systems  Constitutional:  Positive for malaise/fatigue. Negative for chills, diaphoresis, fever and weight loss.  HENT:  Negative for nosebleeds and sore throat.   Eyes:  Negative for double vision.  Respiratory:  Negative for cough, hemoptysis, sputum production, shortness of breath and wheezing.   Cardiovascular:  Negative for chest pain, palpitations, orthopnea and leg swelling.  Gastrointestinal:  Negative for abdominal pain, blood in stool, constipation, diarrhea, heartburn, melena, nausea and vomiting.  Genitourinary:  Negative for dysuria, frequency and urgency.  Musculoskeletal:  Positive for back pain and joint pain.  Skin: Negative.  Negative for itching and rash.  Neurological:  Positive for tingling. Negative for dizziness, focal weakness, weakness and headaches.  Endo/Heme/Allergies:  Does not bruise/bleed easily.  Psychiatric/Behavioral:  Negative for depression. The patient is not nervous/anxious and does not have insomnia.      MEDICAL HISTORY:  Past Medical History:  Diagnosis Date   Allergy    Breast cancer (HCC)    Cancer (HCC) 06/15/2017   5.1 cm, T3,N1 (clinical): ER/ PR positive, Her 2 neu not overexpressed, High Ki 67. Neuoadjuvant chemotherapy.    Depression    Diabetes mellitus without  complication (HCC) 2017   Edema of left upper extremity    Endometriosis    Family history of breast cancer    Headache    migraines   Hyperlipidemia    Lymphedema of left arm    Ovarian mass    Personal history of chemotherapy    Personal history of radiation therapy    Pneumonia    2018    SURGICAL HISTORY: Past Surgical History:  Procedure Laterality Date   AXILLARY LYMPH NODE BIOPSY Left 07/14/2017   Procedure: INSERTION GEL MARK CLIP LEFT AXILLA;  Surgeon: Earline Mayotte, MD;  Location: [facility] ORS;  Service: General;  Laterality: Left;   BREAST BIOPSY Left    Dr Victorio Palm BREAST METASTATIC CARCINOMA   BREAST LUMPECTOMY Left 01/12/2018   COLONOSCOPY WITH PROPOFOL N/A 12/27/2019   Procedure: COLONOSCOPY WITH PROPOFOL;  Surgeon: Pasty Spillers, MD;  Location: [facility] ENDOSCOPY;  Service: Endoscopy;  Laterality: N/A;   COLONOSCOPY WITH PROPOFOL N/A 01/05/2022   Procedure: COLONOSCOPY WITH PROPOFOL;  Surgeon: Toney Reil, MD;  Location: [facility] ENDOSCOPY;  Service: Gastroenterology;  Laterality: N/A;   OOPHORECTOMY     PARTIAL MASTECTOMY WITH NEEDLE LOCALIZATION Left 01/12/2018   Procedure: PARTIAL MASTECTOMY WITH NEEDLE LOCALIZATION;  Surgeon: Earline Mayotte, MD;  Location: [facility] ORS;  Service: General;  Laterality: Left;   PORTACATH PLACEMENT Right 07/14/2017   Procedure: INSERTION PORT-A-CATH;  Surgeon: Earline Mayotte, MD;  Location: [facility] ORS;  Service: General;  Laterality: Right;   SENTINEL NODE BIOPSY Left 01/12/2018   Procedure: SENTINEL NODE BIOPSY;  Surgeon: Earline Mayotte, MD;  Location: [facility] ORS;  Service: General;  Laterality: Left;   TUBAL LIGATION      SOCIAL HISTORY: Social History   Socioeconomic History   Marital status: Married    Spouse name: Not on file   Number of children: Not on file   Years of education: Not on file   Highest education level: GED or equivalent  Occupational History   Not on file  Tobacco Use   Smoking status:  Every Day    Current packs/day: 1.00    Average packs/day: 1 pack/day for 11.0 years (11.0 ttl pk-yrs)    Types: Cigarettes   Smokeless tobacco: Former    Types: Snuff  Vaping Use   Vaping status: Never Used  Substance and Sexual Activity   Alcohol use: No    Alcohol/week: 0.0 standard drinks of alcohol   Drug use: No   Sexual activity: Yes  Other Topics Concern   Not on file  Social History Narrative   ** Merged History Encounter **       Social Determinants of Health   Financial Resource Strain: Low Risk  (06/06/2022)   Overall Financial Resource Strain (CARDIA)    Difficulty of Paying Living Expenses: Not hard at all  Food Insecurity: Food Insecurity Present (06/06/2022)   Hunger Vital Sign    Worried About Running Out of Food in the Last Year: Sometimes true    Ran Out of Food in the Last Year: Sometimes true  Transportation Needs: No Transportation Needs (06/06/2022)   PRAPARE - Administrator, Civil Service (Medical): No    Lack of Transportation (Non-Medical): No  Physical Activity: Sufficiently Active (06/06/2022)   Exercise Vital Sign    Days of Exercise per Week: 5 days    Minutes of Exercise per Session: 150+ min  Stress: No Stress Concern Present (06/06/2022)   [employer] of Occupational Health - Occupational Stress Questionnaire    Feeling of Stress : Only a little  Social Connections: Socially Isolated (06/06/2022)   Social Connection and Isolation Panel [NHANES]    Frequency of Communication with Friends and Family: Once a week    Frequency of Social Gatherings with Friends and Family: Never    Attends Religious Services: Never    Diplomatic Services operational officer: No    Attends Engineer, structural: Not on file    Marital Status: Married  Catering manager Violence: Not on file    FAMILY HISTORY: Family History  Problem Relation Age of Onset   Colon cancer Mother    Cancer Mother    Other Father        family hx on  dad's side: breast, colon, stomach cancer-biological dad   Diabetes Brother    Pancreatitis Brother    Prostate cancer Brother 35       currently 42 / maternal half-brother   Breast cancer Maternal Aunt 69       currently 32   Breast cancer Maternal Grandmother 40       deceased 103s   Colon cancer Maternal Grandmother    Breast cancer Other 41       mother's sister; deceased 16   Breast cancer Other        mother's sister; age at dx unknown    ALLERGIES:  is allergic to aspirin.  MEDICATIONS:  Current Outpatient Medications  Medication Sig Dispense Refill   aspirin-acetaminophen-caffeine (EXCEDRIN MIGRAINE) 250-250-65 MG tablet Take 2 tablets by  mouth daily as needed for headache.     Blood Glucose Monitoring Suppl (ONETOUCH VERIO) w/Device KIT Use to check blood sugar 3 times a day and document results, bring to appointments.  Goal is <130 fasting blood sugar and <180 two hours after meals. 1 kit 0   DULoxetine (CYMBALTA) 60 MG capsule Take 60 mg by mouth daily.     famotidine (PEPCID) 20 MG tablet TAKE 1 TABLET BY MOUTH EVERY DAY 90 tablet 3   glucose blood test strip Use to check blood sugar 3 times daily, fasting in morning with goal <130 and 2 hours after meals with goal <180.  Bring blood sugar log to visits. 100 each 12   goserelin (ZOLADEX) 3.6 MG injection Inject 3.6 mg into the skin every 28 (twenty-eight) days.     insulin glargine (LANTUS SOLOSTAR) 100 UNIT/ML Solostar Pen Inject 45 Units into the skin at bedtime. 15 mL 6   Insulin Pen Needle (BD PEN NEEDLE NANO 2ND GEN) 32G X 4 MM MISC For use with insulin pens daily. 100 each 12   letrozole (FEMARA) 2.5 MG tablet Take 1 tablet (2.5 mg total) by mouth daily. 30 tablet 3   metFORMIN (GLUCOPHAGE-XR) 500 MG 24 hr tablet START BY TAKING ONE TABLET (500 MG) TWICE A DAY WITH MEALS AND THEN INCREASE IN ONE WEEK TO TWO TABLETS (1000 MG) TWICE A DAY WITH MEALS. 360 tablet 1   naloxone (NARCAN) nasal spray 4 mg/0.1 mL Place 1 spray  into the nose as needed for up to 365 doses (for opioid-induced respiratory depresssion). In case of emergency (overdose), spray once into each nostril. If no response within 3 minutes, repeat application and call 911. 1 each 0   ondansetron (ZOFRAN) 8 MG tablet Take 1 tablet (8 mg total) by mouth 2 (two) times daily as needed for nausea or vomiting. 60 tablet 4   oxyCODONE (OXY IR/ROXICODONE) 5 MG immediate release tablet Take 1 tablet (5 mg total) by mouth 2 (two) times daily as needed for severe pain. Must last 30 days 60 tablet 0   [START ON 09/27/2022] oxyCODONE (OXY IR/ROXICODONE) 5 MG immediate release tablet Take 1 tablet (5 mg total) by mouth 2 (two) times daily as needed for severe pain. Must last 30 days 60 tablet 0   [START ON 10/27/2022] oxyCODONE (OXY IR/ROXICODONE) 5 MG immediate release tablet Take 1 tablet (5 mg total) by mouth 2 (two) times daily as needed for severe pain. Must last 30 days 60 tablet 0   pregabalin (LYRICA) 150 MG capsule TAKE 1 CAPSULE BY MOUTH TWICE A DAY 60 capsule 2   rosuvastatin (CRESTOR) 40 MG tablet TAKE 1 TABLET (40 MG TOTAL) BY MOUTH DAILY. STOP TAKING ATORVASTATIN. 90 tablet 3   sitaGLIPtin (JANUVIA) 100 MG tablet Take 1 tablet (100 mg total) by mouth daily. 90 tablet 4   SUMAtriptan (IMITREX) 100 MG tablet TAKE 1 TABLET BY MOUTH AS NEEDED FOR MIGRAINE. MAY REPEAT IN 2 HOURS IF HEADACHE PERSISTS OR RECURS AS DIRECTED. 10 tablet 0   Vitamin D, Ergocalciferol, (DRISDOL) 1.25 MG (50000 UNIT) CAPS capsule TAKE 1 CAPSULE BY MOUTH ONE TIME PER WEEK 12 capsule 1   tamoxifen (NOLVADEX) 10 MG tablet TAKE 1 TABLET BY MOUTH EVERY DAY (Patient not taking: Reported on 08/30/2022) 90 tablet 0   No current facility-administered medications for this visit.   Facility-Administered Medications Ordered in Other Visits  Medication Dose Route Frequency Provider Last Rate Last Admin   0.9 %  sodium  chloride infusion   Intravenous Once Earna Coder, MD       goserelin  (ZOLADEX) injection 10.8 mg  10.8 mg Subcutaneous Once Louretta Shorten R, MD       heparin lock flush 100 unit/mL  500 Units Intravenous Once Corcoran, Melissa C, MD       sodium chloride flush (NS) 0.9 % injection 10 mL  10 mL Intravenous Once Rosey Bath, MD       zoledronic acid (ZOMETA) 4 mg in sodium chloride 0.9 % 100 mL IVPB  4 mg Intravenous Once Earna Coder, MD          .  PHYSICAL EXAMINATION: ECOG PERFORMANCE STATUS: 0 - Asymptomatic  Vitals:   08/30/22 1318  BP: 108/77  Pulse: 95  Resp: 19  Temp: 98.7 F (37.1 C)  SpO2: 97%    Filed Weights   08/30/22 1318  Weight: 204 lb 1.6 oz (92.6 kg)     Physical Exam HENT:     Head: Normocephalic and atraumatic.     Mouth/Throat:     Pharynx: No oropharyngeal exudate.  Eyes:     Pupils: Pupils are equal, round, and reactive to light.  Cardiovascular:     Rate and Rhythm: Normal rate and regular rhythm.  Pulmonary:     Effort: No respiratory distress.     Breath sounds: No wheezing.  Abdominal:     General: Bowel sounds are normal. There is no distension.     Palpations: Abdomen is soft. There is no mass.     Tenderness: There is no abdominal tenderness. There is no guarding or rebound.  Musculoskeletal:        General: No tenderness. Normal range of motion.     Cervical back: Normal range of motion and neck supple.  Skin:    General: Skin is warm.     Comments:     Neurological:     Mental Status: She is alert and oriented to person, place, and time.  Psychiatric:        Mood and Affect: Affect normal.      LABORATORY DATA:  I have reviewed the data as listed Lab Results  Component Value Date   WBC 8.6 08/30/2022   HGB 12.2 08/30/2022   HCT 38.1 08/30/2022   MCV 96.7 08/30/2022   PLT 164 08/30/2022   Recent Labs    03/01/22 1340 05/31/22 1432 08/30/22 1256  NA 136 134* 138  K 3.6 3.8 3.6  CL 100 100 106  CO2 25 25 23   GLUCOSE 330* 429* 159*  BUN 16 13 15    CREATININE 1.22* 0.92 1.11*  CALCIUM 9.1 9.1 9.1  GFRNONAA 53* >60 59*  PROT 6.8 6.8 6.8  ALBUMIN 3.8 3.6 3.5  AST 21 17 22   ALT 14 13 17   ALKPHOS 77 76 57  BILITOT 0.2* 0.3 0.4    RADIOGRAPHIC STUDIES: I have personally reviewed the radiological images as listed and agreed with the findings in the report. No results found.  ASSESSMENT & PLAN:   Malignant neoplasm of lower-outer quadrant of left breast of female, estrogen receptor positive [facility]) # May 2019- Breast cancer Stage III ER/PR pos her 2 NEG-Adjuvant Zometa q6M [June 2022 x3 years]; Zoladex today q3M.  Mammogram August 2023 [Dr.Piscoya]- WNL.   Most recently on Tamoxifen+ Zoldadex.  No clinical evidence of recurrence; stable.  # however, OFF Currently OFF Tamoxifen 10 mg [lower dose; previous intolerance to 20 mg/AI- ]- secondary to joint  pains. Discontinue Tamoxifen.   # START Letrozole 2.5 mgq day. monitor closley the [facility] AEs.   # Mild Anemia- Hb 12.3- s/p PO gentle iron. Hx of colonoscopy- 2022 ? S/p colonoscopy [nov 2023;  Dr.Vanga. OCT 2023- 133; sat-19%.; continue linzess- stable   # Depression/ Insomnia--stable On cymblata/Amiytrplitine qhs /Dr.vaslow[DI-none as per uptodate*] Dr.kapur]- stable   # Bone health- DEC 2021- BMD T score: 0.3. Joint pains-multifactorial- Improved off Lyrica [As per pt ]- improved of tamoxifen- monitor closley on Letrozole. Check vit D levels.   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   # Lymphedema-left chest walls/p physical therapy- stable.   # DM-2 on insulin- poorly controlled  [A1c-6.8] Blood glucose - stable.   # Port malfunction: flush; s/p TPA-- # port/IV access- Stable Zometa q 11m; zola 3 m # DISPOSITION: # Zoladex today; Zometa .  # Follow up in 3 month;  MD;port/ cbc/cmp;iron; ferritin- ca-27-29; vit D 25 OH; Zoladex-  Dr.B    All questions were answered. The patient knows to call the clinic with any problems, questions or concerns.    Earna Coder, MD 08/30/2022 2:00 PM

## 2022-08-30 NOTE — Patient Instructions (Signed)

## 2022-08-30 NOTE — Progress Notes (Signed)
Patient has no concerns today. Patient states she has stopped taking Tamoxifen for about the last 30 days or more.  Patient states she stopped the medication because she was in pain and couldn't work full time anymore. Patient states since stopping, she can move around more and better and is started to feel like herself again.

## 2022-08-31 LAB — CANCER ANTIGEN 27.29: CA 27.29: 11.2 U/mL (ref 0.0–38.6)

## 2022-09-07 ENCOUNTER — Ambulatory Visit: Payer: Commercial Managed Care - PPO | Admitting: Nurse Practitioner

## 2022-09-07 DIAGNOSIS — F322 Major depressive disorder, single episode, severe without psychotic features: Secondary | ICD-10-CM

## 2022-09-07 DIAGNOSIS — I951 Orthostatic hypotension: Secondary | ICD-10-CM

## 2022-09-07 DIAGNOSIS — E1169 Type 2 diabetes mellitus with other specified complication: Secondary | ICD-10-CM

## 2022-09-07 DIAGNOSIS — G43009 Migraine without aura, not intractable, without status migrainosus: Secondary | ICD-10-CM

## 2022-09-07 DIAGNOSIS — C50512 Malignant neoplasm of lower-outer quadrant of left female breast: Secondary | ICD-10-CM

## 2022-09-07 DIAGNOSIS — G620 Drug-induced polyneuropathy: Secondary | ICD-10-CM

## 2022-09-07 DIAGNOSIS — E1165 Type 2 diabetes mellitus with hyperglycemia: Secondary | ICD-10-CM

## 2022-09-07 DIAGNOSIS — G894 Chronic pain syndrome: Secondary | ICD-10-CM

## 2022-09-07 DIAGNOSIS — F1120 Opioid dependence, uncomplicated: Secondary | ICD-10-CM

## 2022-09-07 DIAGNOSIS — F19982 Other psychoactive substance use, unspecified with psychoactive substance-induced sleep disorder: Secondary | ICD-10-CM

## 2022-09-15 ENCOUNTER — Ambulatory Visit
Admission: RE | Admit: 2022-09-15 | Discharge: 2022-09-15 | Disposition: A | Discharge status: Home / Self Care | Payer: Commercial Managed Care - PPO | Source: Ambulatory Visit | Attending: Surgery | Admitting: Surgery

## 2022-09-15 DIAGNOSIS — Z1231 Encounter for screening mammogram for malignant neoplasm of breast: Secondary | ICD-10-CM | POA: Diagnosis present

## 2022-09-23 ENCOUNTER — Encounter: Payer: Self-pay | Admitting: Surgery

## 2022-09-23 ENCOUNTER — Ambulatory Visit (INDEPENDENT_AMBULATORY_CARE_PROVIDER_SITE_OTHER): Payer: Commercial Managed Care - PPO | Admitting: Surgery

## 2022-09-23 VITALS — BP 126/77 | HR 87 | Temp 98.9°F | Ht 64.0 in | Wt 207.4 lb

## 2022-09-23 DIAGNOSIS — C50512 Malignant neoplasm of lower-outer quadrant of left female breast: Secondary | ICD-10-CM

## 2022-09-23 DIAGNOSIS — Z170 Estrogen receptor positive status [ER+]: Secondary | ICD-10-CM

## 2022-09-23 NOTE — Progress Notes (Signed)
09/23/2022  History of Present Illness: Carolyn Roy is a 57 y.o. female status post left lumpectomy and sentinel lymph node biopsy on 01/12/2018.  She presents today for follow-up.  She has been doing well and denies any pain or issues with either breast.  She had her mammogram on 09/15/22, which did not show any suspicious findings or masses.   I have personally viewed the images.  Past Medical History: Past Medical History:  Diagnosis Date   Allergy    Breast cancer (HCC)    Cancer (HCC) 06/15/2017   5.1 cm, T3,N1 (clinical): ER/ PR positive, Her 2 neu not overexpressed, High Ki 67. Neuoadjuvant chemotherapy.    Depression    Diabetes mellitus without complication (HCC) 2017   Edema of left upper extremity    Endometriosis    Family history of breast cancer    Headache    migraines   Hyperlipidemia    Lymphedema of left arm    Ovarian mass    Personal history of chemotherapy    Personal history of radiation therapy    Pneumonia    2018     Past Surgical History: Past Surgical History:  Procedure Laterality Date   AXILLARY LYMPH NODE BIOPSY Left 07/14/2017   Procedure: INSERTION GEL MARK CLIP LEFT AXILLA;  Surgeon: Earline Mayotte, MD;  Location: [facility];  Service: General;  Laterality: Left;   BREAST BIOPSY Left    Dr Victorio Palm BREAST METASTATIC CARCINOMA   BREAST LUMPECTOMY Left 01/12/2018   COLONOSCOPY WITH PROPOFOL N/A 12/27/2019   Procedure: COLONOSCOPY WITH PROPOFOL;  Surgeon: Pasty Spillers, MD;  Location: [facility];  Service: Endoscopy;  Laterality: N/A;   COLONOSCOPY WITH PROPOFOL N/A 01/05/2022   Procedure: COLONOSCOPY WITH PROPOFOL;  Surgeon: Toney Reil, MD;  Location: [facility];  Service: Gastroenterology;  Laterality: N/A;   OOPHORECTOMY     PARTIAL MASTECTOMY WITH NEEDLE LOCALIZATION Left 01/12/2018   Procedure: PARTIAL MASTECTOMY WITH NEEDLE LOCALIZATION;  Surgeon: Earline Mayotte, MD;  Location: [facility] OR[facility];  Service:  General;  Laterality: Left;   PORTACATH PLACEMENT Right 07/14/2017   Procedure: INSERTION PORT-A-CATH;  Surgeon: Earline Mayotte, MD;  Location: [facility];  Service: General;  Laterality: Right;   SENTINEL NODE BIOPSY Left 01/12/2018   Procedure: SENTINEL NODE BIOPSY;  Surgeon: Earline Mayotte, MD;  Location: [facility];  Service: General;  Laterality: Left;   TUBAL LIGATION      Home Medications: Prior to Admission medications   Medication Sig Start Date End Date Taking? Authorizing Provider  amitriptyline (ELAVIL) 50 MG tablet TAKE 1 TABLET BY MOUTH EVERYDAY AT BEDTIME 03/15/21  Yes Vaslow, Georgeanna Lea, MD  aspirin-acetaminophen-caffeine (EXCEDRIN MIGRAINE) [phone] MG tablet Take 2 tablets by mouth daily as needed for headache.    Yes [provider]  Blood Glucose Monitoring Suppl (ONETOUCH VERIO) w/Device KIT Use to check blood sugar 3 times a day and document results, bring to appointments.  Goal is <130 fasting blood sugar and <180 two hours after meals. 02/12/21  Yes Cannady, Jolene T, NP  DULoxetine (CYMBALTA) 60 MG capsule Take 60 mg by mouth daily.   Yes [provider]  famotidine (PEPCID) 20 MG tablet TAKE 1 TABLET BY MOUTH EVERY DAY 04/13/21  Yes Cannady, Jolene T, NP  glucose blood test strip Use to check blood sugar 3 times daily, fasting in morning with goal <130 and 2 hours after meals with goal <180.  Bring blood sugar log to visits. 11/11/20  Yes Cannady, Jolene T, NP  goserelin (ZOLADEX) 3.6 MG injection Inject 3.6 mg into the skin every 28 (twenty-eight) days.   Yes [provider]  Insulin Glargine (BASAGLAR KWIKPEN) 100 UNIT/ML Inject 10 Units into the skin daily. 05/14/21  Yes Cannady, Jolene T, NP  Insulin Pen Needle (NOVOFINE) 30G X 8 MM MISC Inject 10 each into the skin as needed. 05/14/21  Yes Cannady, Jolene T, NP  linagliptin (TRADJENTA) 5 MG TABS tablet Take 1 tablet (5 mg total) by mouth daily. 08/26/21  Yes Cannady, Jolene T, NP  metFORMIN  (GLUCOPHAGE-XR) 500 MG 24 hr tablet Start by taking one tablet (500 MG) twice a day with meals and then increase in one week to two tablets (1000 MG) twice a day with meals. 02/12/21  Yes Cannady, Jolene T, NP  oxyCODONE (OXY IR/ROXICODONE) 5 MG immediate release tablet Take 1 tablet (5 mg total) by mouth 2 (two) times daily as needed for severe pain. Must last 30 days 09/02/21 10/02/21 Yes Delano Metz, MD  oxyCODONE (OXY IR/ROXICODONE) 5 MG immediate release tablet Take 1 tablet (5 mg total) by mouth 2 (two) times daily as needed for severe pain. Must last 30 days 11/01/21 12/01/21 Yes Delano Metz, MD  potassium chloride (KLOR-CON) 8 MEQ tablet Take 8 mEq by mouth daily.   Yes [provider]  pregabalin (LYRICA) 150 MG capsule Take 1 capsule (150 mg total) by mouth 2 (two) times daily. 07/28/21  Yes Cannady, Jolene T, NP  rosuvastatin (CRESTOR) 40 MG tablet Take 1 tablet (40 mg total) by mouth daily. Stop taking Atorvastatin. 02/13/21  Yes Cannady, Jolene T, NP  SUMAtriptan (IMITREX) 100 MG tablet Take 1 tablet (100 mg total) by mouth every 2 (two) hours as needed for migraine. May repeat in 2 hours if headache persists or recurs. 05/21/21  Yes Vaslow, Georgeanna Lea, MD  tamoxifen (NOLVADEX) 10 MG tablet TAKE 1 TABLET BY MOUTH EVERY DAY 03/15/21  Yes Earna Coder, MD  Vitamin D, Ergocalciferol, (DRISDOL) 1.25 MG (50000 UNIT) CAPS capsule TAKE 1 CAPSULE BY MOUTH ONE TIME PER WEEK 06/01/21  Yes Earna Coder, MD  potassium chloride (KLOR-CON M) 10 MEQ tablet Take 1 tablet (10 mEq total) by mouth daily for 5 days. 08/26/21 08/31/21  Marjie Skiff, NP    Allergies: Allergies  Allergen Reactions   Aspirin Nausea And Vomiting    Review of Systems: Review of Systems  Constitutional:  Negative for chills and fever.  Respiratory:  Negative for shortness of breath.   Cardiovascular:  Negative for chest pain.  Gastrointestinal:  Negative for abdominal pain, nausea and  vomiting.  Skin:  Negative for rash.    Physical Exam BP 126/77   Pulse 87   Temp 98.9 F (37.2 C) (Oral)   Ht 5\' 4"  (1.626 m)   Wt 207 lb 6.4 oz (94.1 kg)   LMP  (LMP Unknown) Comment: LAST PERIOD IN MAY 2019 WHEN SHE STARTED CHEMO  SpO2 96%   BMI 35.60 kg/m  CONSTITUTIONAL: No acute distress, well-nourished HEENT:  Normocephalic, atraumatic, extraocular motion intact. RESPIRATORY:  Normal respiratory effort without pathologic use of accessory muscles. CARDIOVASCULAR: Regular rhythm and rate BREAST: Left breast status post lumpectomy with scar well-healed.  There are no palpable masses, skin changes, or nipple changes.  Left axillary incision is also well-healed with no palpable lymphadenopathy.  Right breast without any palpable masses, skin changes, or nipple changes.  No right axillary lymphadenopathy. NEUROLOGIC:  Motor and sensation is  grossly normal.  Cranial nerves are grossly intact. PSYCH:  Alert and oriented to person, place and time. Affect is normal.  Labs/Imaging: CA 27.29: 11.2  Mammogram on 09/15/22: FINDINGS: There are no findings suspicious for malignancy.   IMPRESSION: No mammographic evidence of malignancy. A result letter of this screening mammogram will be mailed directly to the patient.   RECOMMENDATION: Screening mammogram in one year. (Code:SM-B-01Y)   BI-RADS CATEGORY  1: Negative.  Assessment and Plan: This is a 57 y.o. female status post left lumpectomy and sentinel lymph node biopsy.  --Patient is doing well and is now close to 5 years from her surgery.  No evidence of recurrence or any complications on exam and her mammograms have been negative without any suspicious findings.  Discussed with her that for now we can change follow up to "as needed" but that we are always available if any questions or concerns that she may have.    --Patient is wondering when her Port-A-Cath can be removed.  She reports that she still gets some treatments with Dr.  Donneta Romberg.  Discussed with her that when it's no longer needed, she can call us to set up an office procedure for removal of the port.  I spent 20 minutes dedicated to the care of this patient on the date of this encounter to include pre-visit review of records, face-to-face time with the patient discussing diagnosis and management, and any post-visit coordination of care.   Howie Ill, MD [facility]

## 2022-09-23 NOTE — Patient Instructions (Addendum)
If you have any concerns or questions, please feel free to call our office. Follow up as needed.   Breast Self-Awareness Breast self-awareness means being familiar with how your breasts look and feel. It involves checking your breasts regularly and telling your health care provider about any changes. Practicing breast self-awareness helps to maintain breast health. Sometimes, changes are not harmful (are benign). Other times, a change in your breasts can be a sign of a serious medical problem. Being familiar with the look and feel of your breasts can help you catch a breast problem while it is still small and can be treated. You should do breast self-exams even if you have breast implants. What you need: A mirror. A well-lit room. A pillow or other soft object. How to do a breast self-exam A breast self-exam is one way to learn what is normal for your breasts and whether your breasts are changing. To do a breast self-exam: Look for changes  Remove all the clothing above your waist. Stand in front of a mirror in a room with good lighting. Put your hands down at your sides. Compare your breasts in the mirror. Look for differences between them (asymmetry), such as: Differences in shape. Differences in size. Puckers, dips, and bumps in one breast and not the other. Look at each breast for changes in the skin, such as: Redness. Scaly areas. Skin thickening. Dimpling. Open sores (ulcers). Look for changes in your nipples, such as: Discharge. Bleeding. Dimpling. Redness. A nipple that looks pushed in (retracted), or that has changed position. Feel for changes Carefully feel your breasts for lumps and changes. It is best to do this self-exam while lying down. Follow these steps to feel each breast: Place a pillow under the shoulder of one side of your body. Place the arm of that side of your body behind your head. Feel the breast of that side of your body using the hand of the opposite  arm. To do this: Start in the nipple area and use the pads of your three middle fingers to make -inch (2 cm) overlapping circles. Use light, medium, and then firm pressure as you feel your breast, gently covering the entire breast area and armpit. Continue the overlapping circles, moving downward over the breast until you feel your ribs below your breast. Then, make circles with your fingers going upward until you reach your collarbone. Next, make circles by moving outward across your breast and into your armpit area. Squeeze the nipple. Check for discharge and lumps. Repeat steps 1-7 to check your other breast. Sit or stand in the tub or shower. With soapy water on your skin, feel each breast the same way you did when you were lying down. Write down what you find Writing down what you find can help you remember what to discuss with your health care provider. Write down: What is normal for each breast. Any changes that you find in each breast. These include: The kind of changes you find. Any pain or tenderness. Size and location of any lumps. Where you are in your menstrual cycle, if you are still getting your menstrual period (menstruating). General tips If you are breastfeeding, the best time to examine your breasts is after a feeding or after using a breast pump. If you menstruate, the best time to examine your breasts is 5-7 days after your menstrual period. Breasts are generally lumpier during menstrual periods, and it may be more difficult to notice changes. With time and practice, you  will become more familiar with the differences in your breasts and more comfortable with the exam. Contact a health care provider if: You see a change in the shape or size of your breasts or nipples. You see a change in the skin of your breast or nipples, such as a reddened or scaly area. You have unusual discharge from your nipples. You find a new lump or thick area. You have breast pain. You have  any concerns about your breast health. Summary Breast self-awareness includes looking for physical changes in your breasts and feeling for any changes within your breasts. Breast self-awareness should be done in front of a mirror in a well-lit room. If you menstruate, the best time to examine your breasts is 5-7 days after your menstrual period. Tell your health care provider about any changes you notice in your breasts. Changes include changes in size, changes on the skin, pain or tenderness, or unusual fluid from your nipples. This information is not intended to replace advice given to you by your health care provider. Make sure you discuss any questions you have with your health care provider. Document Revised: 07/01/2021 Document Reviewed: 11/26/2020 Elsevier Patient Education  2024 [employer].

## 2022-09-27 ENCOUNTER — Other Ambulatory Visit (HOSPITAL_COMMUNITY): Admission: RE | Admit: 2022-09-27 | Payer: Commercial Managed Care - PPO | Source: Ambulatory Visit

## 2022-09-27 ENCOUNTER — Ambulatory Visit: Payer: Commercial Managed Care - PPO | Admitting: Nurse Practitioner

## 2022-09-27 ENCOUNTER — Encounter: Payer: Self-pay | Admitting: Nurse Practitioner

## 2022-09-27 VITALS — BP 102/68 | HR 86 | Temp 98.0°F | Ht 64.0 in | Wt 204.0 lb

## 2022-09-27 DIAGNOSIS — C50512 Malignant neoplasm of lower-outer quadrant of left female breast: Secondary | ICD-10-CM | POA: Diagnosis not present

## 2022-09-27 DIAGNOSIS — F19982 Other psychoactive substance use, unspecified with psychoactive substance-induced sleep disorder: Secondary | ICD-10-CM

## 2022-09-27 DIAGNOSIS — G620 Drug-induced polyneuropathy: Secondary | ICD-10-CM

## 2022-09-27 DIAGNOSIS — I951 Orthostatic hypotension: Secondary | ICD-10-CM

## 2022-09-27 DIAGNOSIS — Z124 Encounter for screening for malignant neoplasm of cervix: Secondary | ICD-10-CM

## 2022-09-27 DIAGNOSIS — G894 Chronic pain syndrome: Secondary | ICD-10-CM

## 2022-09-27 DIAGNOSIS — F1120 Opioid dependence, uncomplicated: Secondary | ICD-10-CM

## 2022-09-27 DIAGNOSIS — E1165 Type 2 diabetes mellitus with hyperglycemia: Secondary | ICD-10-CM

## 2022-09-27 DIAGNOSIS — F17219 Nicotine dependence, cigarettes, with unspecified nicotine-induced disorders: Secondary | ICD-10-CM

## 2022-09-27 DIAGNOSIS — E1169 Type 2 diabetes mellitus with other specified complication: Secondary | ICD-10-CM

## 2022-09-27 DIAGNOSIS — F322 Major depressive disorder, single episode, severe without psychotic features: Secondary | ICD-10-CM

## 2022-09-27 DIAGNOSIS — Z794 Long term (current) use of insulin: Secondary | ICD-10-CM

## 2022-09-27 LAB — BAYER DCA HB A1C WAIVED: HB A1C (BAYER DCA - WAIVED): 7.4 % — ABNORMAL HIGH (ref 4.8–5.6)

## 2022-09-27 NOTE — Assessment & Plan Note (Signed)

## 2022-09-27 NOTE — Progress Notes (Signed)
BP 102/68   Pulse 86   Temp 98 F (36.7 C) (Oral)   Ht 5\' 4"  (1.626 m)   Wt 204 lb (92.5 kg)   LMP  (LMP Unknown) Comment: LAST PERIOD IN MAY 2019 WHEN SHE STARTED CHEMO  SpO2 98%   BMI 35.02 kg/m    Subjective:    Patient ID: Carolyn Roy, female    DOB: 10-08-1965, 57 y.o.   MRN: [id number]  HPI: Carolyn Roy is a 57 y.o. female  Chief Complaint  Patient presents with   Depression   Diabetes   Hyperlipidemia   Hypertension   Cramping    Pt states she has been getting cramps in her legs and feet at night. States it mainly happens in her R leg and foot for the past few months.    DIABETES A1c 8.7% April. To be taking Metformin XR 1000 MG BID, Januvia 100 MG, and Lantus 45 units.  Trulicity stopped due to GI effects and Jardiance was too costly.  No steroid use recently.  Has been working on diet.    She is a smoker, 1/2 PPD.  Quit for 6 years, but then started back.  She started smoking at age 70, smoked >35 years total. Hypoglycemic episodes:no Polydipsia/polyuria: no Visual disturbance: no Chest pain: no Paresthesias: no Glucose Monitoring: yes  Accucheck frequency: BID  Fasting glucose: <100 in morning on average  Post prandial:   Evening: 170-180 range  Before meals:  Taking Insulin?: yes  Long acting insulin: 45 units  Short acting insulin: Blood Pressure Monitoring: not checking Retinal Examination: Not Up To Date -- Dr. Clydene Pugh will schedule Foot Exam: Up to Date Pneumovax: Up To Date Influenza: Up To Date Aspirin: no   HYPERLIPIDEMIA Taking Rosuvastatin 40 MG daily.  Had sleep study at [facility] in 2023, she reports Dr. Barbaraann Cao told her they want to do a CPAP titration -- does not have CPAP at this time.  Never attained this.  Has had leg cramps bilaterally over past 3 months, only at night time -- makes difficult to sleep at times.  Not taking anything for this.  Saw cardiology for this on 12/01/21 for hypotension, reassuring  work-up. Satisfied with current treatment? yes Duration of hyperlipidemia: chronic Cholesterol medication side effects: no Cholesterol supplements: none Aspirin: no Recent stressors: no Recurrent headaches: no Visual changes: no Palpitations: no Dyspnea: no Chest pain: no Lower extremity edema: no Dizzy/lightheaded: no  The ASCVD Risk score (Arnett DK, et al., 2019) failed to calculate for the following reasons:   The valid total cholesterol range is 130 to 320 mg/dL   CHRONIC PAIN  Followed by pain clinic for chronic pain -- last visit 08/22/22 continues Oxycodone and Pregabalin.  Well controlled. Pain control status: stable Duration: chronic Location: knees and back Quality: dull, aching, and throbbing Current Pain Level: 3/10 Previous Pain Level: 10/10 Breakthrough pain: no Benefit from narcotic medications: yes What Activities task can be accomplished with current medication? Able to work and perform ADLs Interested in weaning off narcotics:no   Stool softners/OTC fiber: yes  Previous pain specialty evaluation: yes Non-narcotic analgesic meds: no Narcotic contract: yes   DEPRESSION Taking Duloxetine 60 MG daily + Amitriptyline PRN.  Followed by Dr. Maryruth Bun, last visit 10/25/21.  She has stopped Lamictal and would like to reduce off Duloxetine.    Has breast cancer and last visit with oncology 08/30/22.  Receives infusions, every 3-6 months. Mood status: stable  States she occasionally takes duloxetine. Satisfied  with current treatment?: yes Symptom severity: stable to mild  Duration of current treatment : chronic Side effects: no Medication compliance: good compliance Psychotherapy/counseling: none Depressed mood: occasional Anxious mood: occasional Anhedonia: no Significant weight loss or gain: no Insomnia: yes hard to stay asleep Fatigue: no Feelings of worthlessness or guilt: no Impaired concentration/indecisiveness: no Suicidal ideations: no Hopelessness:  no Crying spells: occasional    09/27/2022    2:32 PM 06/06/2022    2:38 PM 05/25/2022    2:59 PM 03/07/2022   10:20 AM 12/03/2021    2:32 PM  Depression screen PHQ 2/9  Decreased Interest 0 1 0 0 0  Down, Depressed, Hopeless 1 1 0 1 1  PHQ - 2 Score 1 2 0 1 1  Altered sleeping 2 3  1 1   Tired, decreased energy 1 1  1 1   Change in appetite 0 3  1 1   Feeling bad or failure about yourself  0 0  0 0  Trouble concentrating 0 2  0 0  Moving slowly or fidgety/restless 0 2  0 2  Suicidal thoughts 0 0  0 0  PHQ-9 Score 4 13  4 6   Difficult doing work/chores Not difficult at all   Somewhat difficult Somewhat difficult      09/27/2022    2:33 PM 06/06/2022    2:38 PM 03/07/2022   10:21 AM 12/03/2021    2:33 PM  GAD 7 : Generalized Anxiety Score  Nervous, Anxious, on Edge 1 1 0 1  Control/stop worrying 1 0 0 0  Worry too much - different things 1 0 0 0  Trouble relaxing 0 1 0 0  Restless 0 1 0 0  Easily annoyed or irritable 1 1 1  0  Afraid - awful might happen 0 1 0 0  Total GAD 7 Score 4 5 1 1   Anxiety Difficulty Not difficult at all  Somewhat difficult Somewhat difficult   Relevant past medical, surgical, family and social history reviewed and updated as indicated. Interim medical history since our last visit reviewed. Allergies and medications reviewed and updated.  Review of Systems  Constitutional:  Negative for activity change, appetite change, fatigue and fever.  Respiratory:  Negative for cough, chest tightness and shortness of breath.   Cardiovascular:  Negative for chest pain, palpitations and leg swelling.  Gastrointestinal: Negative.   Endocrine: Negative for cold intolerance, heat intolerance, polydipsia, polyphagia and polyuria.  Genitourinary:  Negative for difficulty urinating.  Musculoskeletal:  Negative for myalgias.  Neurological: Negative.  Negative for dizziness and light-headedness.  Psychiatric/Behavioral:  Negative for decreased concentration, self-injury,  sleep disturbance and suicidal ideas. The patient is not nervous/anxious.    Per HPI unless specifically indicated above     Objective:    BP 102/68   Pulse 86   Temp 98 F (36.7 C) (Oral)   Ht 5\' 4"  (1.626 m)   Wt 204 lb (92.5 kg)   LMP  (LMP Unknown) Comment: LAST PERIOD IN MAY 2019 WHEN SHE STARTED CHEMO  SpO2 98%   BMI 35.02 kg/m   Wt Readings from Last 3 Encounters:  09/27/22 204 lb (92.5 kg)  09/23/22 207 lb 6.4 oz (94.1 kg)  08/30/22 204 lb 1.6 oz (92.6 kg)    Physical Exam Vitals and nursing note reviewed. Exam conducted with a chaperone present.  Constitutional:      General: She is awake. She is not in acute distress.    Appearance: She is well-developed and well-groomed. She is  obese. She is not ill-appearing or toxic-appearing.  HENT:     Head: Normocephalic.     Right Ear: Hearing normal.     Left Ear: Hearing normal.  Eyes:     General: Lids are normal.        Right eye: No discharge.        Left eye: No discharge.     Conjunctiva/sclera: Conjunctivae normal.     Pupils: Pupils are equal, round, and reactive to light.  Neck:     Thyroid: No thyromegaly.     Vascular: No carotid bruit.  Cardiovascular:     Rate and Rhythm: Normal rate and regular rhythm.     Heart sounds: Normal heart sounds. No murmur heard.    No gallop.  Pulmonary:     Effort: Pulmonary effort is normal. No accessory muscle usage or respiratory distress.     Breath sounds: Normal breath sounds. No wheezing or rhonchi.  Abdominal:     General: Bowel sounds are normal. There is no distension.     Palpations: Abdomen is soft.     Tenderness: There is no abdominal tenderness.     Hernia: There is no hernia in the left inguinal area or right inguinal area.  Genitourinary:    Exam position: Lithotomy position.     Labia:        Right: No rash.        Left: No rash.      Vagina: Normal.     Cervix: Normal.     Uterus: Normal.      Adnexa: Right adnexa normal and left adnexa normal.      Rectum: Normal.     Comments: Cervix anterior and viewed.  Pap obtained. Musculoskeletal:     Cervical back: Normal range of motion and neck supple.     Right lower leg: No edema.     Left lower leg: No edema.  Lymphadenopathy:     Cervical: No cervical adenopathy.  Skin:    General: Skin is warm and dry.  Neurological:     Mental Status: She is alert and oriented to person, place, and time.     Cranial Nerves: Cranial nerves 2-12 are intact.     Motor: Motor function is intact.     Coordination: Coordination is intact.     Gait: Gait is intact.     Deep Tendon Reflexes: Reflexes are normal and symmetric.     Reflex Scores:      Brachioradialis reflexes are 2+ on the right side and 2+ on the left side.      Patellar reflexes are 2+ on the right side and 2+ on the left side. Psychiatric:        Attention and Perception: Attention normal.        Mood and Affect: Mood normal.        Speech: Speech normal.        Behavior: Behavior normal. Behavior is cooperative.        Thought Content: Thought content normal.    Results for orders placed or performed in visit on 08/30/22  Cancer antigen 27.29  Result Value Ref Range   CA 27.29 11.2 0.0 - 38.6 U/mL  CMP (Cancer Center only)  Result Value Ref Range   Sodium 138 135 - 145 mmol/L   Potassium 3.6 3.5 - 5.1 mmol/L   Chloride 106 98 - 111 mmol/L   CO2 23 22 - 32 mmol/L   Glucose, Bld 159 (H)  70 - 99 mg/dL   BUN 15 6 - 20 mg/dL   Creatinine 4.09 (H) 8.11 - 1.00 mg/dL   Calcium 9.1 8.9 - 91.4 mg/dL   Total Protein 6.8 6.5 - 8.1 g/dL   Albumin 3.5 3.5 - 5.0 g/dL   AST 22 15 - 41 U/L   ALT 17 0 - 44 U/L   Alkaline Phosphatase 57 38 - 126 U/L   Total Bilirubin 0.4 0.3 - 1.2 mg/dL   GFR, Estimated 59 (L) >60 mL/min   Anion gap 9 5 - 15  CBC with Differential (Cancer Center Only)  Result Value Ref Range   WBC Count 8.6 4.0 - 10.5 K/uL   RBC 3.94 3.87 - 5.11 MIL/uL   Hemoglobin 12.2 12.0 - 15.0 g/dL   HCT 78.2 95.6 - 21.3 %    MCV 96.7 80.0 - 100.0 fL   MCH 31.0 26.0 - 34.0 pg   MCHC 32.0 30.0 - 36.0 g/dL   RDW 08.6 57.8 - 46.9 %   Platelet Count 164 150 - 400 K/uL   nRBC 0.0 0.0 - 0.2 %   Neutrophils Relative % 69 %   Neutro Abs 6.0 1.7 - 7.7 K/uL   Lymphocytes Relative 25 %   Lymphs Abs 2.1 0.7 - 4.0 K/uL   Monocytes Relative 5 %   Monocytes Absolute 0.4 0.1 - 1.0 K/uL   Eosinophils Relative 0 %   Eosinophils Absolute 0.0 0.0 - 0.5 K/uL   Basophils Relative 1 %   Basophils Absolute 0.0 0.0 - 0.1 K/uL   Immature Granulocytes 0 %   Abs Immature Granulocytes 0.02 0.00 - 0.07 K/uL      Assessment & Plan:   Problem List Items Addressed This Visit       Cardiovascular and Mediastinum   Hypotension    Ongoing, stable. Baseline BP between 90-110/60-70 on review going back to 2020.  Recommend to continue to take plenty of water intake daily and add a some salt to diet + wear compression on during day and off at night.  She has seen cardiology with overall stable work-up. Would avoid HTN medications.        Endocrine   Hyperlipidemia associated with type 2 diabetes mellitus (HCC)    Chronic, ongoing.  Continue Rosuvastatin 40 MG daily.  Lipid panel today and adjust dose as needed.      Relevant Orders   Bayer DCA Hb A1c Waived   Comprehensive metabolic panel   Lipid Panel w/o Chol/HDL Ratio   Type 2 diabetes mellitus with hyperglycemia, with long-term current use of insulin (HCC) - Primary    Chronic, ongoing.  A1c 7.4% today, prior was 8.7%, trending down/praised for this. Eye exam and foot exam up to date.  Cannot take Jardiance due to cost. Trulicity caused GI issues.  Continue to collaborate [facility]. - Increase Lantus to 50 units and back to 47 units if too many lows <70 with this change. -  Will continue Metformin and Januvia at current dosing.  May benefit stopping DPP4 in future and retrial of Ozempic if cost effective and covered. -  Recommend monitor BS at home TID and heavily focus on  diet changes.  - Urine ALB 150 January 2024, can not take ACE/ARB due to hypotension.  Statin on board.   - Return in 3 months for A1c check.  Will have return to endo if ongoing poor control.      Relevant Orders   Bayer DCA Hb A1c  Waived     Nervous and Auditory   Chemotherapy-induced neuropathy (HCC) (Chronic)    Chronic, ongoing.  Continue current pain management collaboration and medication regimen as prescribed by them.      Nicotine dependence, cigarettes, w unsp disorders    I have recommended complete cessation of tobacco use. I have discussed various options available for assistance with tobacco cessation including over the counter methods (Nicotine gum, patch and lozenges). We also discussed prescription options (Chantix, Nicotine Inhaler / Nasal Spray). The patient is not interested in pursuing any prescription tobacco cessation options at this time.  Recommend we start lung CT screening, she will plan on this next visit as has had a lot of testing recently.          Other   Chronic pain syndrome (Chronic)    Followed by pain management, continue this collaboration, recent notes reviewed.      Uncomplicated opioid dependence (HCC) (Chronic)    Followed by pain management, continue this collaboration, recent notes reviewed.      Malignant neoplasm of lower-outer quadrant of left breast of female, estrogen receptor positive (HCC)    Ongoing.  Continue collaboration with oncology, recent note reviewed.      Obesity    BMI 35.032.  Recommended eating smaller high protein, low fat meals more frequently and exercising 30 mins a day 5 times a week with a goal of 10-15lb weight loss in the next 3 months. Patient voiced their understanding and motivation to adhere to these recommendations.       Severe depression (HCC)    Chronic, ongoing.  She has stopped Lamictal and would like to reduce off Duloxetine, educated her on how to perform this reduction safely - however if  worsening mood recommend she restart.  Also discussed pain management benefit of Duloxetine.  Will continue Amitriptyline as needed.  Currently denies SI/HI.  Recommend she schedule f/u with Dr. Maryruth Bun as needed in future if worsening mood.  At this time she reports being stable.      Other Visit Diagnoses     Cervical cancer screening       Pap obtained today.   Relevant Orders   Cytology - PAP        Follow up plan: Return in about 3 months (around 12/28/2022) for T2DM, HTN/HLD, CANCER, MOOD.

## 2022-09-27 NOTE — Assessment & Plan Note (Addendum)
Chronic, ongoing.  She has stopped Lamictal and would like to reduce off Duloxetine, educated her on how to perform this reduction safely - however if worsening mood recommend she restart.  Also discussed pain management benefit of Duloxetine.  Will continue Amitriptyline as needed.  Currently denies SI/HI.  Recommend she schedule f/u with Dr. Maryruth Bun as needed in future if worsening mood.  At this time she reports being stable.

## 2022-09-27 NOTE — Assessment & Plan Note (Signed)
Chronic, ongoing.  Continue Rosuvastatin 40 MG daily.  Lipid panel today and adjust dose as needed.

## 2022-09-27 NOTE — Assessment & Plan Note (Signed)
Followed by pain management, continue this collaboration, recent notes reviewed.

## 2022-09-27 NOTE — Assessment & Plan Note (Signed)
Ongoing.  Continue collaboration with oncology, recent note reviewed.

## 2022-09-27 NOTE — Assessment & Plan Note (Signed)
Ongoing, stable. Baseline BP between 90-110/60-70 on review going back to 2020.  Recommend to continue to take plenty of water intake daily and add a some salt to diet + wear compression on during day and off at night.  She has seen cardiology with overall stable work-up. Would avoid HTN medications.

## 2022-09-27 NOTE — Assessment & Plan Note (Signed)
Chronic, ongoing.  A1c 7.4% today, prior was 8.7%, trending down/praised for this. Eye exam and foot exam up to date.  Cannot take Jardiance due to cost. Trulicity caused GI issues.  Continue to collaborate [facility]. - Increase Lantus to 50 units and back to 47 units if too many lows <70 with this change. -  Will continue Metformin and Januvia at current dosing.  May benefit stopping DPP4 in future and retrial of Ozempic if cost effective and covered. -  Recommend monitor BS at home TID and heavily focus on diet changes.  - Urine ALB 150 January 2024, can not take ACE/ARB due to hypotension.  Statin on board.   - Return in 3 months for A1c check.  Will have return to endo if ongoing poor control.

## 2022-09-27 NOTE — Assessment & Plan Note (Signed)
BMI 35.032.  Recommended eating smaller high protein, low fat meals more frequently and exercising 30 mins a day 5 times a week with a goal of 10-15lb weight loss in the next 3 months. Patient voiced their understanding and motivation to adhere to these recommendations.

## 2022-09-27 NOTE — Assessment & Plan Note (Signed)
Chronic, ongoing.  Continue current pain management collaboration and medication regimen as prescribed by them. 

## 2022-09-27 NOTE — Patient Instructions (Addendum)
Magnesium 400 MG at night and Alpha Lipoic Acid over the counter.  Diabetes Mellitus and Nutrition, Adult When you have diabetes, or diabetes mellitus, it is very important to have healthy eating habits because your blood sugar (glucose) levels are greatly affected by what you eat and drink. Eating healthy foods in the right amounts, at about the same times every day, can help you: Manage your blood glucose. Lower your risk of heart disease. Improve your blood pressure. Reach or maintain a healthy weight. What can affect my meal plan? Every person with diabetes is different, and each person has different needs for a meal plan. Your health care provider may recommend that you work with a dietitian to make a meal plan that is best for you. Your meal plan may vary depending on factors such as: The calories you need. The medicines you take. Your weight. Your blood glucose, blood pressure, and cholesterol levels. Your activity level. Other health conditions you have, such as heart or kidney disease. How do carbohydrates affect me? Carbohydrates, also called carbs, affect your blood glucose level more than any other type of food. Eating carbs raises the amount of glucose in your blood. It is important to know how many carbs you can safely have in each meal. This is different for every person. Your dietitian can help you calculate how many carbs you should have at each meal and for each snack. How does alcohol affect me? Alcohol can cause a decrease in blood glucose (hypoglycemia), especially if you use insulin or take certain diabetes medicines by mouth. Hypoglycemia can be a life-threatening condition. Symptoms of hypoglycemia, such as sleepiness, dizziness, and confusion, are similar to symptoms of having too much alcohol. Do not drink alcohol if: Your health care provider tells you not to drink. You are pregnant, may be pregnant, or are planning to become pregnant. If you drink alcohol: Limit  how much you have to: 0-1 drink a day for women. 0-2 drinks a day for men. Know how much alcohol is in your drink. In the [place] one drink equals one 12 oz bottle of beer (355 mL), one 5 oz glass of wine (148 mL), or one 1 oz glass of hard liquor (44 mL). Keep yourself hydrated with water, diet soda, or unsweetened iced tea. Keep in mind that regular soda, juice, and other mixers may contain a lot of sugar and must be counted as carbs. What are tips for following this plan?  Reading food labels Start by checking the serving size on the Nutrition Facts label of packaged foods and drinks. The number of calories and the amount of carbs, fats, and other nutrients listed on the label are based on one serving of the item. Many items contain more than one serving per package. Check the total grams (g) of carbs in one serving. Check the number of grams of saturated fats and trans fats in one serving. Choose foods that have a low amount or none of these fats. Check the number of milligrams (mg) of salt (sodium) in one serving. Most people should limit total sodium intake to less than 2,300 mg per day. Always check the nutrition information of foods labeled as "low-fat" or "nonfat." These foods may be higher in added sugar or refined carbs and should be avoided. Talk to your dietitian to identify your daily goals for nutrients listed on the label. Shopping Avoid buying canned, pre-made, or processed foods. These foods tend to be high in fat, sodium, and added sugar.  Shop around the outside edge of the grocery store. This is where you will most often find fresh fruits and vegetables, bulk grains, fresh meats, and fresh dairy products. Cooking Use low-heat cooking methods, such as baking, instead of high-heat cooking methods, such as deep frying. Cook using healthy oils, such as olive, canola, or sunflower oil. Avoid cooking with butter, cream, or high-fat meats. Meal planning Eat meals and snacks  regularly, preferably at the same times every day. Avoid going long periods of time without eating. Eat foods that are high in fiber, such as fresh fruits, vegetables, beans, and whole grains. Eat 4-6 oz (112-168 g) of lean protein each day, such as lean meat, chicken, fish, eggs, or tofu. One ounce (oz) (28 g) of lean protein is equal to: 1 oz (28 g) of meat, chicken, or fish. 1 egg.  cup (62 g) of tofu. Eat some foods each day that contain healthy fats, such as avocado, nuts, seeds, and fish. What foods should I eat? Fruits Berries. Apples. Oranges. Peaches. Apricots. Plums. Grapes. Mangoes. Papayas. Pomegranates. Kiwi. Cherries. Vegetables Leafy greens, including lettuce, spinach, kale, chard, collard greens, mustard greens, and cabbage. Beets. Cauliflower. Broccoli. Carrots. Green beans. Tomatoes. Peppers. Onions. Cucumbers. Brussels sprouts. Grains Whole grains, such as whole-wheat or whole-grain bread, crackers, tortillas, cereal, and pasta. Unsweetened oatmeal. Quinoa. Brown or wild rice. Meats and other proteins Seafood. Poultry without skin. Lean cuts of poultry and beef. Tofu. Nuts. Seeds. Dairy Low-fat or fat-free dairy products such as milk, yogurt, and cheese. The items listed above may not be a complete list of foods and beverages you can eat and drink. Contact a dietitian for more information. What foods should I avoid? Fruits Fruits canned with syrup. Vegetables Canned vegetables. Frozen vegetables with butter or cream sauce. Grains Refined white flour and flour products such as bread, pasta, snack foods, and cereals. Avoid all processed foods. Meats and other proteins Fatty cuts of meat. Poultry with skin. Breaded or fried meats. Processed meat. Avoid saturated fats. Dairy Full-fat yogurt, cheese, or milk. Beverages Sweetened drinks, such as soda or iced tea. The items listed above may not be a complete list of foods and beverages you should avoid. Contact a  dietitian for more information. Questions to ask a health care provider Do I need to meet with a certified diabetes care and education specialist? Do I need to meet with a dietitian? What number can I call if I have questions? When are the best times to check my blood glucose? Where to find more information: American Diabetes Association: diabetes.org Academy of Nutrition and Dietetics: eatright.[employer] of Diabetes and Digestive and Kidney Diseases: StageSync.si Association of Diabetes Care & Education Specialists: diabeteseducator.org Summary It is important to have healthy eating habits because your blood sugar (glucose) levels are greatly affected by what you eat and drink. It is important to use alcohol carefully. A healthy meal plan will help you manage your blood glucose and lower your risk of heart disease. Your health care provider may recommend that you work with a dietitian to make a meal plan that is best for you. This information is not intended to replace advice given to you by your health care provider. Make sure you discuss any questions you have with your health care provider. Document Revised: 08/28/2019 Document Reviewed: 08/28/2019 Elsevier Patient Education  2024 [employer].

## 2022-09-28 LAB — COMPREHENSIVE METABOLIC PANEL
ALT: 16 IU/L (ref 0–32)
AST: 19 IU/L (ref 0–40)
Albumin: 4 g/dL (ref 3.8–4.9)
Alkaline Phosphatase: 83 IU/L (ref 44–121)
BUN/Creatinine Ratio: 15 (ref 9–23)
BUN: 16 mg/dL (ref 6–24)
Bilirubin Total: 0.3 mg/dL (ref 0.0–1.2)
CO2: 26 mmol/L (ref 20–29)
Calcium: 9.8 mg/dL (ref 8.7–10.2)
Chloride: 103 mmol/L (ref 96–106)
Creatinine, Ser: 1.1 mg/dL — ABNORMAL HIGH (ref 0.57–1.00)
Globulin, Total: 2.2 g/dL (ref 1.5–4.5)
Glucose: 210 mg/dL — ABNORMAL HIGH (ref 70–99)
Potassium: 4 mmol/L (ref 3.5–5.2)
Sodium: 143 mmol/L (ref 134–144)
Total Protein: 6.2 g/dL (ref 6.0–8.5)
eGFR: 59 mL/min/{1.73_m2} — ABNORMAL LOW (ref >59)

## 2022-09-28 LAB — LIPID PANEL W/O CHOL/HDL RATIO
Cholesterol, Total: 113 mg/dL (ref 100–199)
HDL: 45 mg/dL (ref >39)
LDL Chol Calc (NIH): 49 mg/dL (ref 0–99)
Triglycerides: 102 mg/dL (ref 0–149)
VLDL Cholesterol Cal: 19 mg/dL (ref 5–40)

## 2022-09-28 NOTE — Progress Notes (Signed)
Contacted via MyChart   Good evening Sashia, your labs have returned: - Kidney function, creatinine and eGFR, does continue to show mild stage 3a kidney disease with no worsening.  We will continue to monitor. Liver function, AST and ALT, is normal. - Cholesterol levels are at goal.  Continue statin therapy.  Any questions? Keep being stellar!!  Thank you for allowing me to participate in your care.  I appreciate you. Kindest regards, Milam Allbaugh

## 2022-09-30 ENCOUNTER — Other Ambulatory Visit: Payer: Self-pay | Admitting: Nurse Practitioner

## 2022-09-30 LAB — CYTOLOGY - PAP
Adequacy: ABSENT
Comment: NEGATIVE
Diagnosis: NEGATIVE
High risk HPV: NEGATIVE

## 2022-09-30 NOTE — Telephone Encounter (Signed)
Requested medication (s) are due for refill today: Yes  Requested medication (s) are on the active medication list: Yes  Last refill:  06/02/22 #60, 2RF  Future visit scheduled: No  Notes to clinic:  Unable to refill per protocol, cannot delegate.      Requested Prescriptions  Pending Prescriptions Disp Refills   pregabalin (LYRICA) 150 MG capsule [Pharmacy Med Name: PREGABALIN 150 MG CAPSULE] 60 capsule     Sig: TAKE 1 CAPSULE BY MOUTH TWICE A DAY     Not Delegated - Neurology:  Anticonvulsants - Controlled - pregabalin Failed - 09/30/2022  9:33 AM      Failed - This refill cannot be delegated      Failed - Cr in normal range and within 360 days    Creatinine  Date Value Ref Range Status  08/30/2022 1.11 (H) 0.44 - 1.00 mg/dL Final   Creatinine, Ser  Date Value Ref Range Status  09/27/2022 1.10 (H) 0.57 - 1.00 mg/dL Final         Passed - Completed PHQ-2 or PHQ-9 in the last 360 days      Passed - Valid encounter within last 12 months    Recent Outpatient Visits           3 days ago Type 2 diabetes mellitus with hyperglycemia, with long-term current use of insulin (HCC)   [place] [facility] [place], [place] T, NP   3 months ago Type 2 diabetes mellitus with hyperglycemia, with long-term current use of insulin (HCC)   [place] [facility] [place], [place] T, NP   6 months ago Type 2 diabetes mellitus with hyperglycemia, with long-term current use of insulin (HCC)   [place] [facility] [place], [place] T, NP   10 months ago Type 2 diabetes mellitus with hyperglycemia, with long-term current use of insulin (HCC)   [place] [facility] [place], [place] T, NP   1 year ago Type 2 diabetes mellitus with hyperglycemia, with long-term current use of insulin (HCC)   [place] [facility] [place], Dorie Rank, NP       Future Appointments             In 3 months [place], Dorie Rank, NP   [facility]

## 2022-09-30 NOTE — Progress Notes (Signed)
Contacted via MyChart   Good afternoon Carolyn Roy, your pap has returned and is all negative, including HPV!!  Woohoo!!  Repeat in 5 years.

## 2022-11-13 NOTE — Progress Notes (Unsigned)
PROVIDER NOTE: Information contained herein reflects review and annotations entered in association with encounter. Interpretation of such information and data should be left to medically-trained personnel. Information provided to patient can be located elsewhere in the medical record under "Patient Instructions". Document created using STT-dictation technology, any transcriptional errors that may result from process are unintentional.    Patient: Carolyn Roy  Service Category: E/M  Provider: Oswaldo Done, MD  DOB: [birth year]  DOS: 11/14/2022  Referring Provider: Marjie Skiff, NP  MRN: [id number]  Specialty: Interventional Pain Management  PCP: Marjie Skiff, NP  Type: Established Patient  Setting: Ambulatory outpatient    Location: Office  Delivery: Face-to-face     HPI  [name], a 57 y.o. year old female, is here today because of her No primary diagnosis found.. [name] primary complain today is No chief complaint on file.  Pertinent problems: [name] has Chronic fatigue; Family history of breast cancer; Chemotherapy-induced neuropathy (HCC); Malignant neoplasm of lower-outer quadrant of left breast of female, estrogen receptor positive (HCC); Chronic feet pain (1ry area of Pain) (Bilateral) (R>L); Neuropathic pain of feet (Bilateral); Chronic knee pain (2ry area of Pain) (Bilateral) (R>L); Chronic hand pain (3ry area of Pain) (Bilateral) (R>L); Chronic pain syndrome; Chronic hip pain (4th area of Pain) (Bilateral) (R>L); Chronic upper extremity pain (3ry area of Pain) (Bilateral) (R>L); Cancer-related pain; Neuropathic pain; Osteoarthritis of knee (Right); Chronic low back pain (Bilateral (L>R) w/o sciatica; Chronic hip pain (Left); Lumbar facet syndrome (Bilateral) (L>R); Idiopathic scoliosis; Chronic sacroiliac joint pain (Left); Chronic pain of knee (Right); Abnormal MRI, lumbar spine (04/20/2018); Lumbar facet arthropathy; Spondylosis without myelopathy or  radiculopathy, lumbosacral region; DDD (degenerative disc disease), lumbosacral; Numbness and tingling of upper extremity (C6/C7 dermatomes) (Right); Personal history of breast cancer; Cervicalgia; Cervical radiculitis (Right); Effusion of knee joint (Right); Tricompartment osteoarthritis of knee (Right); 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 Pain Assessment: Severity of   is reported as a  /10. Location:    / . Onset:  . Quality:  . Timing:  . Modifying factor(s):  [name] Vitals:  vitals were not taken for this visit.  BMI: Estimated body mass index is 35.02 kg/m as calculated from the following:   Height as of 09/27/22: 5\' 4"  (1.626 m).   Weight as of 09/27/22: 204 lb (92.5 kg). Last encounter: 08/22/2022. Last procedure: Visit date not found.  Reason for encounter: medication management. ***  Pharmacotherapy Assessment  Analgesic: Oxycodone IR 5 mg, 1 tab PO BID (10 mg/day of oxycodone) (Average: 2 tabs/day = 10 mg/day of oxycodone) MME/day: 15 mg/day.   Monitoring: [place] PMP: PDMP reviewed during this encounter.       Pharmacotherapy: No side-effects or adverse reactions reported. Compliance: No problems identified. Effectiveness: Clinically acceptable.  No notes on file  No results found for: "CBDTHCR" No results found for: "D8THCCBX" No results found for: "D9THCCBX"  UDS:  Summary  Date Value Ref Range Status  08/22/2022 Note  Final    Comment:    ==================================================================== [employer] 13 (MW) ==================================================================== Test                             Result       Flag       Units  Drug Present and Declared for Prescription Verification  Oxycodone                      1803         EXPECTED   ng/mg creat   Oxymorphone                    74            EXPECTED   ng/mg creat   Noroxycodone                   2697         EXPECTED   ng/mg creat   Noroxymorphone                 82           EXPECTED   ng/mg creat    Sources of oxycodone are scheduled prescription medications.    Oxymorphone, noroxycodone, and noroxymorphone are expected    metabolites of oxycodone. Oxymorphone is also available as a    scheduled prescription medication.  ==================================================================== Test                      Result    Flag   Units      Ref Range   Creatinine              119              mg/dL      >=40 ==================================================================== Declared Medications:  The flagging and interpretation on this report are based on the  following declared medications.  Unexpected results may arise from  inaccuracies in the declared medications.   **Note: The testing scope of this panel includes these medications:   Oxycodone (Roxicodone)   **Note: The testing scope of this panel does not include the  following reported medications:   Acetaminophen (Excedrin)  Aspirin (Excedrin)  Caffeine (Excedrin)  Duloxetine (Cymbalta)  Famotidine (Pepcid)  Goserelin  Insulin (Lantus)  Metformin (Glucophage)  Naloxone (Narcan)  Ondansetron (Zofran)  Pregabalin (Lyrica)  Rosuvastatin (Crestor)  Sitagliptin (Januvia)  Sumatriptan (Imitrex)  Tamoxifen (Nolvadex)  Vitamin D2 (Drisdol) ==================================================================== For clinical consultation, please call [phone]. ====================================================================       ROS  Constitutional: Denies any fever or chills Gastrointestinal: No reported hemesis, hematochezia, vomiting, or acute GI distress Musculoskeletal: Denies any acute onset joint swelling, redness, loss of ROM, or weakness Neurological: No reported episodes of acute onset apraxia, aphasia, dysarthria, agnosia,  amnesia, paralysis, loss of coordination, or loss of consciousness  Medication Review  DULoxetine, Insulin Pen Needle, OneTouch Verio, SUMAtriptan, Vitamin D (Ergocalciferol), aspirin-acetaminophen-caffeine, famotidine, glucose blood, goserelin, insulin glargine, letrozole, metFORMIN, naloxone, ondansetron, oxyCODONE, pregabalin, rosuvastatin, and sitaGLIPtin  History Review  Allergy: Ms. Paper is allergic to aspirin. Drug: Ms. Oakden  reports no history of drug use. Alcohol:  reports no history of alcohol use. Tobacco:  reports that she has been smoking cigarettes. She has a 11 pack-year smoking history. She has quit using smokeless tobacco.  Her smokeless tobacco use included snuff. Social: Ms. Fanti  reports that she has been smoking cigarettes. She has a 11 pack-year smoking history. She has quit using smokeless tobacco.  Her smokeless tobacco use included snuff. She reports that she does not drink alcohol and does not use drugs. Medical:  has a past medical history of Allergy, Breast cancer (HCC), Cancer (HCC) (06/15/2017), Depression, Diabetes mellitus without complication (HCC) (2017), Edema of left upper extremity, Endometriosis, Family history  of breast cancer, Headache, Hyperlipidemia, Lymphedema of left arm, Ovarian mass, Personal history of chemotherapy, Personal history of radiation therapy, and Pneumonia. Surgical: Ms. Burford  has a past surgical history that includes Oophorectomy; Tubal ligation; Portacath placement (Right, 07/14/2017); Axillary lymph node biopsy (Left, 07/14/2017); Breast lumpectomy (Left, 01/12/2018); Breast biopsy (Left); Partial mastectomy with needle localization (Left, 01/12/2018); Sentinel node biopsy (Left, 01/12/2018); Colonoscopy with propofol (N/A, 12/27/2019); and Colonoscopy with propofol (N/A, 01/05/2022). Family: family history includes Breast cancer in an other family member; Breast cancer (age of onset: 29) in her maternal grandmother; Breast cancer (age of  onset: 29) in an other family member; Breast cancer (age of onset: 53) in her maternal aunt; Cancer in her mother; Colon cancer in her maternal grandmother and mother; Diabetes in her brother; Other in her father; Pancreatitis in her brother; Prostate cancer (age of onset: 58) in her brother.  Laboratory Chemistry Profile   Renal Lab Results  Component Value Date   BUN 16 09/27/2022   CREATININE 1.10 (H) 09/27/2022   BCR 15 09/27/2022   GFRAA >60 10/21/2019   GFRNONAA 59 (L) 08/30/2022    Hepatic Lab Results  Component Value Date   AST 19 09/27/2022   ALT 16 09/27/2022   ALBUMIN 4.0 09/27/2022   ALKPHOS 83 09/27/2022   LIPASE 31 12/01/2020    Electrolytes Lab Results  Component Value Date   NA 143 09/27/2022   K 4.0 09/27/2022   CL 103 09/27/2022   CALCIUM 9.8 09/27/2022   MG 2.3 03/07/2022    Bone Lab Results  Component Value Date   VD25OH 76.3 12/03/2021    Inflammation (CRP: Acute Phase) (ESR: Chronic Phase) Lab Results  Component Value Date   CRP <1 12/21/2020   ESRSEDRATE 3 12/21/2020         Note: Above Lab results reviewed.  Recent Imaging Review  MM 3D SCREENING MAMMOGRAM BILATERAL BREAST CLINICAL DATA:  Screening.  EXAM: DIGITAL SCREENING BILATERAL MAMMOGRAM WITH TOMOSYNTHESIS AND CAD  TECHNIQUE: Bilateral screening digital craniocaudal and mediolateral oblique mammograms were obtained. Bilateral screening digital breast tomosynthesis was performed. The images were evaluated with computer-aided detection.  COMPARISON:  Previous exam(s).  ACR Breast Density Category b: There are scattered areas of fibroglandular density.  FINDINGS: There are no findings suspicious for malignancy.  IMPRESSION: No mammographic evidence of malignancy. A result letter of this screening mammogram will be mailed directly to the patient.  RECOMMENDATION: Screening mammogram in one year. (Code:SM-B-01Y)  BI-RADS CATEGORY  1: Negative.  Electronically Signed    By: Sande Brothers M.D.   On: 09/19/2022 11:47 Note: Reviewed        Physical Exam  General appearance: Well nourished, well developed, and well hydrated. In no apparent acute distress Mental status: Alert, oriented x 3 (person, place, & time)       Respiratory: No evidence of acute respiratory distress Eyes: PERLA Vitals: LMP  (LMP Unknown) Comment: LAST PERIOD IN MAY 2019 WHEN SHE STARTED CHEMO BMI: Estimated body mass index is 35.02 kg/m as calculated from the following:   Height as of 09/27/22: 5\' 4"  (1.626 m).   Weight as of 09/27/22: 204 lb (92.5 kg). Ideal: Patient weight not recorded  Assessment   Diagnosis Status  1. Chronic feet pain (1ry area of Pain) (Bilateral) (R>L)   2. Chronic knee pain (2ry area of Pain) (Bilateral) (R>L)   3. Chronic hand pain (3ry area of Pain) (Bilateral) (R>L)   4. Cervicalgia   5. DDD (degenerative disc  disease), lumbosacral   6. Cancer-related pain   7. Lumbar facet syndrome (Bilateral) (L>R)   8. Chronic low back pain (Bilateral (L>R) w/o sciatica   9. Arthropathy of spinal facet joint concurrent with and due to effusion (L3-4, L4-5)   10. Chronic hip pain (4th area of Pain) (Bilateral) (R>L)   11. Grade 1 Anterolisthesis of lumbar spine (L3/L4 and L4/L5)   12. Abnormal MRI, lumbar spine (04/20/2018)   13. Chronic pain syndrome   14. Pharmacologic therapy   15. Chronic use of opiate for therapeutic purpose   16. Encounter for medication management   17. Encounter for chronic pain management    Controlled Controlled Controlled   Updated Problems: No problems updated.  Plan of Care  Problem-specific:  No problem-specific Assessment & Plan notes found for this encounter.  Ms. STASIA MARSCHKE has a current medication list which includes the following long-term medication(s): famotidine, lantus solostar, metformin, oxycodone, pregabalin, rosuvastatin, sitagliptin, and sumatriptan.  Pharmacotherapy (Medications Ordered): No orders  of the defined types were placed in this encounter.  Orders:  No orders of the defined types were placed in this encounter.  Follow-up plan:   No follow-ups on file.      Interventional Therapies  Risk  Complexity Considerations:   Estimated body mass index is 30.55 kg/m as calculated from the following:   Height as of this encounter: 5\' 4"  (1.626 m).   Weight as of this encounter: 178 lb (80.7 kg). WNL   Planned  Pending:      Under consideration:   Possible bilateral lumbar facet RFA  Diagnostic lumbar sympathetic Blk  Diagnostic right genicular NB  Possible right genicular nerve RFA    Completed:   Therapeutic right IA steroid knee inj. x1 (08/18/2020) (100/100/100 x 24 hours/80-85)  Diagnostic bilateral lumbar facet Blk x2 (08/16/18) (100/100/80/90-100)  Therapeutic/palliative right knee Hyalgan inj. x5 (06/25/2019) (100/100/75 x 1 week/70)    Therapeutic  Palliative (PRN) options:   Therapeutic right IA steroid knee inj.  Diagnostic bilateral lumbar facet MBB   Therapeutic/palliative right Hyalgan knee inj.     Pharmacotherapy  Nonopioids transfer 02/03/2020: Pregabalin (Lyrica) and alpha-lipoic acid          Recent Visits Date Type Provider Dept  08/22/22 Office Visit Delano Metz, MD [facility]  Showing recent visits within past 90 days and meeting all other requirements Future Appointments Date Type Provider Dept  11/14/22 Appointment Delano Metz, MD [facility]  Showing future appointments within next 90 days and meeting all other requirements  I discussed the assessment and treatment plan with the patient. The patient was provided an opportunity to ask questions and all were answered. The patient agreed with the plan and demonstrated an understanding of the instructions.  Patient advised to call back or seek an in-person evaluation if the symptoms or condition worsens.  Duration of encounter: *** minutes.  Total  time on encounter, as per AMA guidelines included both the face-to-face and non-face-to-face time personally spent by the physician and/or other qualified health care professional(s) on the day of the encounter (includes time in activities that require the physician or other qualified health care professional and does not include time in activities normally performed by clinical staff). Physician's time may include the following activities when performed: Preparing to see the patient (e.g., pre-charting review of records, searching for previously ordered imaging, lab work, and nerve conduction tests) Review of prior analgesic pharmacotherapies. Reviewing PMP Interpreting ordered tests (e.g., lab work,  imaging, nerve conduction tests) Performing post-procedure evaluations, including interpretation of diagnostic procedures Obtaining and/or reviewing separately obtained history Performing a medically appropriate examination and/or evaluation Counseling and educating the patient/family/caregiver Ordering medications, tests, or procedures Referring and communicating with other health care professionals (when not separately reported) Documenting clinical information in the electronic or other health record Independently interpreting results (not separately reported) and communicating results to the patient/ family/caregiver Care coordination (not separately reported)  Note by: Oswaldo Done, MD Date: 11/14/2022; Time: 4:45 PM

## 2022-11-13 NOTE — Patient Instructions (Signed)
 ____________________________________________________________________________________________  Opioid Pain Medication Update  To: All patients taking opioid pain medications. (I.e.: hydrocodone, hydromorphone, oxycodone, oxymorphone, morphine, codeine, methadone, tapentadol, tramadol, buprenorphine, fentanyl, etc.)  Re: Updated review of side effects and adverse reactions of opioid analgesics, as well as new information about long term effects of this class of medications.  Direct risks of long-term opioid therapy are not limited to opioid addiction and overdose. Potential medical risks include serious fractures, breathing problems during sleep, hyperalgesia, immunosuppression, chronic constipation, bowel obstruction, myocardial infarction, and tooth decay secondary to xerostomia.  Unpredictable adverse effects that can occur even if you take your medication correctly: Cognitive impairment, respiratory depression, and death. Most people think that if they take their medication "correctly", and "as instructed", that they will be safe. Nothing could be farther from the truth. In reality, a significant amount of recorded deaths associated with the use of opioids has occurred in individuals that had taken the medication for a long time, and were taking their medication correctly. The following are examples of how this can happen: Patient taking his/her medication for a long time, as instructed, without any side effects, is given a certain antibiotic or another unrelated medication, which in turn triggers a "Drug-to-drug interaction" leading to disorientation, cognitive impairment, impaired reflexes, respiratory depression or an untoward event leading to serious bodily harm or injury, including death.  Patient taking his/her medication for a long time, as instructed, without any side effects, develops an acute impairment of liver and/or kidney function. This will lead to a rapid inability of the body to  breakdown and eliminate their pain medication, which will result in effects similar to an "overdose", but with the same medicine and dose that they had always taken. This again may lead to disorientation, cognitive impairment, impaired reflexes, respiratory depression or an untoward event leading to serious bodily harm or injury, including death.  A similar problem will occur with patients as they grow older and their liver and kidney function begins to decrease as part of the aging process.  Background information: Historically, the original case for using long-term opioid therapy to treat chronic noncancer pain was based on safety assumptions that subsequent experience has called into question. In 1996, the American Pain Society and the American Academy of Pain Medicine issued a consensus statement supporting long-term opioid therapy. This statement acknowledged the dangers of opioid prescribing but concluded that the risk for addiction was low; respiratory depression induced by opioids was short-lived, occurred mainly in opioid-naive patients, and was antagonized by pain; tolerance was not a common problem; and efforts to control diversion should not constrain opioid prescribing. This has now proven to be wrong. Experience regarding the risks for opioid addiction, misuse, and overdose in community practice has failed to support these assumptions.  According to the Centers for Disease Control and Prevention, fatal overdoses involving opioid analgesics have increased sharply over the past decade. Currently, more than 96,700 people die from drug overdoses every year. Opioids are a factor in 7 out of every 10 overdose deaths. Deaths from drug overdose have surpassed motor vehicle accidents as the leading cause of death for individuals between the ages of 66 and 14.  Clinical data suggest that neuroendocrine dysfunction may be very common in both men and women, potentially causing hypogonadism, erectile  dysfunction, infertility, decreased libido, osteoporosis, and depression. Recent studies linked higher opioid dose to increased opioid-related mortality. Controlled observational studies reported that long-term opioid therapy may be associated with increased risk for cardiovascular events. Subsequent meta-analysis concluded  that the safety of long-term opioid therapy in elderly patients has not been proven.   Side Effects and adverse reactions: Common side effects: Drowsiness (sedation). Dizziness. Nausea and vomiting. Constipation. Physical dependence -- Dependence often manifests with withdrawal symptoms when opioids are discontinued or decreased. Tolerance -- As you take repeated doses of opioids, you require increased medication to experience the same effect of pain relief. Respiratory depression -- This can occur in healthy people, especially with higher doses. However, people with COPD, asthma or other lung conditions may be even more susceptible to fatal respiratory impairment.  Uncommon side effects: An increased sensitivity to feeling pain and extreme response to pain (hyperalgesia). Chronic use of opioids can lead to this. Delayed gastric emptying (the process by which the contents of your stomach are moved into your small intestine). Muscle rigidity. Immune system and hormonal dysfunction. Quick, involuntary muscle jerks (myoclonus). Arrhythmia. Itchy skin (pruritus). Dry mouth (xerostomia).  Long-term side effects: Chronic constipation. Sleep-disordered breathing (SDB). Increased risk of bone fractures. Hypothalamic-pituitary-adrenal dysregulation. Increased risk of overdose.  RISKS: Respiratory depression and death: Opioids increase the risk of respiratory depression and death.  Drug-to-drug interactions: Opioids are relatively contraindicated in combination with benzodiazepines, sleep inducers, and other central nervous system depressants. Other classes of medications  (i.e.: certain antibiotics and even over-the-counter medications) may also trigger or induce respiratory depression in some patients.  Medical conditions: Patients with pre-existing respiratory problems are at higher risk of respiratory failure and/or depression when in combination with opioid analgesics. Opioids are relatively contraindicated in some medical conditions such as central sleep apnea.   Fractures and Falls:  Opioids increase the risk and incidence of falls. This is of particular importance in elderly patients.  Endocrine System:  Long-term administration is associated with endocrine abnormalities (endocrinopathies). (Also known as Opioid-induced Endocrinopathy) Influences on both the hypothalamic-pituitary-adrenal axis?and the hypothalamic-pituitary-gonadal axis have been demonstrated with consequent hypogonadism and adrenal insufficiency in both sexes. Hypogonadism and decreased levels of dehydroepiandrosterone sulfate have been reported in men and women. Endocrine effects include: Amenorrhoea in women (abnormal absence of menstruation) Reduced libido in both sexes Decreased sexual function Erectile dysfunction in men Hypogonadisms (decreased testicular function with shrinkage of testicles) Infertility Depression and fatigue Loss of muscle mass Anxiety Depression Immune suppression Hyperalgesia Weight gain Anemia Osteoporosis Patients (particularly women of childbearing age) should avoid opioids. There is insufficient evidence to recommend routine monitoring of asymptomatic patients taking opioids in the long-term for hormonal deficiencies.  Immune System: Human studies have demonstrated that opioids have an immunomodulating effect. These effects are mediated via opioid receptors both on immune effector cells and in the central nervous system. Opioids have been demonstrated to have adverse effects on antimicrobial response and anti-tumour surveillance. Buprenorphine has  been demonstrated to have no impact on immune function.  Opioid Induced Hyperalgesia: Human studies have demonstrated that prolonged use of opioids can lead to a state of abnormal pain sensitivity, sometimes called opioid induced hyperalgesia (OIH). Opioid induced hyperalgesia is not usually seen in the absence of tolerance to opioid analgesia. Clinically, hyperalgesia may be diagnosed if the patient on long-term opioid therapy presents with increased pain. This might be qualitatively and anatomically distinct from pain related to disease progression or to breakthrough pain resulting from development of opioid tolerance. Pain associated with hyperalgesia tends to be more diffuse than the pre-existing pain and less defined in quality. Management of opioid induced hyperalgesia requires opioid dose reduction.  Cancer: Chronic opioid therapy has been associated with an increased risk of cancer  among noncancer patients with chronic pain. This association was more evident in chronic strong opioid users. Chronic opioid consumption causes significant pathological changes in the small intestine and colon. Epidemiological studies have found that there is a link between opium dependence and initiation of gastrointestinal cancers. Cancer is the second leading cause of death after cardiovascular disease. Chronic use of opioids can cause multiple conditions such as GERD, immunosuppression and renal damage as well as carcinogenic effects, which are associated with the incidence of cancers.   Mortality: Long-term opioid use has been associated with increased mortality among patients with chronic non-cancer pain (CNCP).  Prescription of long-acting opioids for chronic noncancer pain was associated with a significantly increased risk of all-cause mortality, including deaths from causes other than overdose.  Reference: Von Korff M, Kolodny A, Deyo RA, Chou R. Long-term opioid therapy reconsidered. Ann Intern Med. 2011  Sep 6;155(5):325-8. doi: 10.7326/[id number]. PMID: 16109604; PMCID: [id number]. Randon Goldsmith, Hayward RA, Dunn KM, [place] KP. Risk of adverse events in patients prescribed long-term opioids: A cohort study in the [place] [facility] Research Datalink. Eur J Pain. 2019 May;23(5):908-922. doi: 10.1002/ejp.1357. Epub 2019 Jan 31. PMID: 78295621. Colameco S, Coren JS, Ciervo CA. Continuous opioid treatment for chronic noncancer pain: a time for moderation in prescribing. Postgrad Med. 2009 Jul;121(4):61-6. doi: 10.3810/pgm.2009.07.2032. PMID: 30865784. William Hamburger RN, [place], Blazina I, Cristopher Peru, Bougatsos C, Deyo RA. The effectiveness and risks of long-term opioid therapy for chronic pain: a systematic review for a [employer] of [employer] Pathways to [employer]. Ann Intern Med. 2015 Feb 17;162(4):276-86. doi: 10.7326/M14-2559. PMID: 69629528. Caryl Bis [facility], [place] DM. NCHS Data Brief No. 22. [place]: Centers for Disease Control and Prevention; 2009. Sep, Increase in Fatal Poisonings Involving Opioid Analgesics in the [place], 1999-2006. Song IA, Choi HR, Oh TK. Long-term opioid use and mortality in patients with chronic non-cancer pain: Ten-year follow-up study in [place] from 2010 through 2019. EClinicalMedicine. 2022 Jul 18;51:101558. doi: 10.1016/j.eclinm.2022.413244. PMID: 01027253; PMCID: [id number]. Huser, W., Schubert, T., Vogelmann, T. et al. All-cause mortality in patients with long-term opioid therapy compared with non-opioid analgesics for chronic non-cancer pain: a database study. BMC Med 18, 162 (2020). http://lester.info/ Rashidian H, Karie Kirks, Malekzadeh R, Haghdoost AA. An Ecological Study of the Association between Opiate Use and Incidence of Cancers. Addict Health. 2016 Fall;8(4):252-260. PMID: 59563875; PMCID: [id number].  Our Goal: Our goal is to control your  pain with means other than the use of opioid pain medications.  Our Recommendation: Talk to your physician about coming off of these medications. We can assist you with the tapering down and stopping these medicines. Based on the new information, even if you cannot completely stop the medication, a decrease in the dose may be associated with a lesser risk. Ask for other means of controlling the pain. Decrease or eliminate those factors that significantly contribute to your pain such as smoking, obesity, and a diet heavily tilted towards "inflammatory" nutrients.  Last Updated: 08/15/2022   ____________________________________________________________________________________________     ____________________________________________________________________________________________  National Pain Medication Shortage  The [place] is experiencing worsening drug shortages. These have had a negative widespread effect on patient care and treatment. Not expected to improve any time soon. Predicted to last past 2029.   Drug shortage list (generic names) Oxycodone IR Oxycodone/APAP Oxymorphone IR Hydromorphone Hydrocodone/APAP Morphine  Where is the problem?  Manufacturing and supply level.  Will this shortage affect you?  Only if you  take any of the above pain medications.  How? You may be unable to fill your prescription.  Your pharmacist may offer a "partial fill" of your prescription. (Warning: Do not accept partial fills.) Prescriptions partially filled cannot be transferred to another pharmacy. Read our Medication Rules and Regulation. Depending on how much medicine you are dependent on, you may experience withdrawals when unable to get the medication.  Recommendations: Consider ending your dependence on opioid pain medications. Ask your pain specialist to assist you with the process. Consider switching to a medication currently not in shortage, such as Buprenorphine. Talk to your pain  specialist about this option. Consider decreasing your pain medication requirements by managing tolerance thru "Drug Holidays". This may help minimize withdrawals, should you run out of medicine. Control your pain thru the use of non-pharmacological interventional therapies.   Your prescriber: Prescribers cannot be blamed for shortages. Medication manufacturing and supply issues cannot be fixed by the prescriber.   NOTE: The prescriber is not responsible for supplying the medication, or solving supply issues. Work with your pharmacist to solve it. The patient is responsible for the decision to take or continue taking the medication and for identifying and securing a legal supply source. By law, supplying the medication is the job and responsibility of the pharmacy. The prescriber is responsible for the evaluation, monitoring, and prescribing of these medications.   Prescribers will NOT: Re-issue prescriptions that have been partially filled. Re-issue prescriptions already sent to a pharmacy.  Re-send prescriptions to a different pharmacy because yours did not have your medication. Ask pharmacist to order more medicine or transfer the prescription to another pharmacy. (Read below.)  New 2023 regulation: "October 08, 2021 Revised Regulation Allows DEA-Registered Pharmacies to Transfer Electronic Prescriptions at a Patient's Request DEA Headquarters Division - Public Information Office Patients now have the ability to request their electronic prescription be transferred to another pharmacy without having to go back to their practitioner to initiate the request. This revised regulation went into effect on Monday, October 04, 2021.     At a patient's request, a DEA-registered retail pharmacy can now transfer an electronic prescription for a controlled substance (schedules II-V) to another DEA-registered retail pharmacy. Prior to this change, patients would have to go through their practitioner to  cancel their prescription and have it re-issued to a different pharmacy. The process was taxing and time consuming for both patients and practitioners.    The Drug Enforcement Administration [facility]) published its intent to revise the process for transferring electronic prescriptions on December 27, 2019.  The final rule was published in the federal register on September 02, 2021 and went into effect 30 days later.  Under the final rule, a prescription can only be transferred once between pharmacies, and only if allowed under existing state or other applicable law. The prescription must remain in its electronic form; may not be altered in any way; and the transfer must be communicated directly between two licensed pharmacists. It's important to note, any authorized refills transfer with the original prescription, which means the entire prescription will be filled at the same pharmacy".  Reference: [email or web address] [facility] website announcement)  [email or web address] [employer] of [employer])   [employer] / Vol. 88, No. 143 / Thursday, September 02, 2021 / Rules and Regulations DEPARTMENT OF JUSTICE  Drug Enforcement Administration  21 CFR Part 1306  [Docket No. [id number]]  RIN [id number] Transfer of Electronic Prescriptions for Schedules II-V Controlled Substances Between Pharmacies for Initial Filling  ____________________________________________________________________________________________  ____________________________________________________________________________________________  Transfer of Pain Medication between Pharmacies  Re: 2023 DEA Clarification on existing regulation  Published on DEA Website: October 08, 2021  Title: Revised Regulation Allows DEA-Registered Pharmacies to Electrical engineer Prescriptions at a Patient's  Request DEA Headquarters Division - [employer]  "Patients now have the ability to request their electronic prescription be transferred to another pharmacy without having to go back to their practitioner to initiate the request. This revised regulation went into effect on Monday, October 04, 2021.     At a patient's request, a DEA-registered retail pharmacy can now transfer an electronic prescription for a controlled substance (schedules II-V) to another DEA-registered retail pharmacy. Prior to this change, patients would have to go through their practitioner to cancel their prescription and have it re-issued to a different pharmacy. The process was taxing and time consuming for both patients and practitioners.    The Drug Enforcement Administration [facility]) published its intent to revise the process for transferring electronic prescriptions on December 27, 2019.  The final rule was published in the federal register on September 02, 2021 and went into effect 30 days later.  Under the final rule, a prescription can only be transferred once between pharmacies, and only if allowed under existing state or other applicable law. The prescription must remain in its electronic form; may not be altered in any way; and the transfer must be communicated directly between two licensed pharmacists. It's important to note, any authorized refills transfer with the original prescription, which means the entire prescription will be filled at the same pharmacy."    REFERENCES: 1. DEA website announcement [email or web address]  2. Department of Justice website  [email or web address].pdf  3. DEPARTMENT OF JUSTICE Drug Enforcement Administration 21 CFR Part 1306 [Docket No. [id number]] RIN [id number] "Transfer of Electronic Prescriptions for Schedules II-V Controlled Substances  Between Pharmacies for Initial Filling"  ____________________________________________________________________________________________     _______________________________________________________________________  Medication Rules  Purpose: To inform patients, and their family members, of our medication rules and regulations.  Applies to: All patients receiving prescriptions from our practice (written or electronic).  Pharmacy of record: This is the pharmacy where your electronic prescriptions will be sent. Make sure we have the correct one.  Electronic prescriptions: In compliance with the [facility] Strengthen Opioid Misuse Prevention (STOP) Act of 2017 (Session Conni Elliot [phone]), effective February 07, 2018, all controlled substances must be electronically prescribed. Written prescriptions, faxing, or calling prescriptions to a pharmacy will no longer be done.  Prescription refills: These will be provided only during in-person appointments. No medications will be renewed without a "face-to-face" evaluation with your provider. Applies to all prescriptions.  NOTE: The following applies primarily to controlled substances (Opioid* Pain Medications).   Type of encounter (visit): For patients receiving controlled substances, face-to-face visits are required. (Not an option and not up to the patient.)  Patient's responsibilities: Pain Pills: Bring all pain pills to every appointment (except for procedure appointments). Pill Bottles: Bring pills in original pharmacy bottle. Bring bottle, even if empty. Always bring the bottle of the most recent fill.  Medication refills: You are responsible for knowing and keeping track of what medications you are taking and when is it that you will need a refill. The day before your appointment: write a list of all prescriptions that need to be refilled. The day of the appointment: give the list to the admitting nurse. Prescriptions will be written only  during appointments. No prescriptions will be written on procedure days. If you forget a  medication: it will not be "Called in", "Faxed", or "electronically sent". You will need to get another appointment to get these prescribed. No early refills. Do not call asking to have your prescription filled early. Partial  or short prescriptions: Occasionally your pharmacy may not have enough pills to fill your prescription.  NEVER ACCEPT a partial fill or a prescription that is short of the total amount of pills that you were prescribed.  With controlled substances the law allows 72 hours for the pharmacy to complete the prescription.  If the prescription is not completed within 72 hours, the pharmacist will require a new prescription to be written. This means that you will be short on your medicine and we WILL NOT send another prescription to complete your original prescription.  Instead, request the pharmacy to send a carrier to a nearby branch to get enough medication to provide you with your full prescription. Prescription Accuracy: You are responsible for carefully inspecting your prescriptions before leaving our office. Have the discharge nurse carefully go over each prescription with you, before taking them home. Make sure that your name is accurately spelled, that your address is correct. Check the name and dose of your medication to make sure it is accurate. Check the number of pills, and the written instructions to make sure they are clear and accurate. Make sure that you are given enough medication to last until your next medication refill appointment. Taking Medication: Take medication as prescribed. When it comes to controlled substances, taking less pills or less frequently than prescribed is permitted and encouraged. Never take more pills than instructed. Never take the medication more frequently than prescribed.  Inform other Doctors: Always inform, all of your healthcare providers, of all the  medications you take. Pain Medication from other Providers: You are not allowed to accept any additional pain medication from any other Doctor or Healthcare provider. There are two exceptions to this rule. (see below) In the event that you require additional pain medication, you are responsible for notifying us, as stated below. Cough Medicine: Often these contain an opioid, such as codeine or hydrocodone. Never accept or take cough medicine containing these opioids if you are already taking an opioid* medication. The combination may cause respiratory failure and death. Medication Agreement: You are responsible for carefully reading and following our Medication Agreement. This must be signed before receiving any prescriptions from our practice. Safely store a copy of your signed Agreement. Violations to the Agreement will result in no further prescriptions. (Additional copies of our Medication Agreement are available upon request.) Laws, Rules, & Regulations: All patients are expected to follow all [address] and [employer] Industries, Rules, [employer]. Ignorance of the Laws does not constitute a valid excuse.  Illegal drugs and Controlled Substances: The use of illegal substances (including, but not limited to marijuana and its derivatives) and/or the illegal use of any controlled substances is strictly prohibited. Violation of this rule may result in the immediate and permanent discontinuation of any and all prescriptions being written by our practice. The use of any illegal substances is prohibited. Adopted CDC guidelines & recommendations: Target dosing levels will be at or below 60 MME/day. Use of benzodiazepines** is not recommended.  Exceptions: There are only two exceptions to the rule of not receiving pain medications from other Healthcare Providers. Exception #1 (Emergencies): In the event of an emergency (i.e.: accident requiring emergency care), you are allowed to receive additional pain  medication. However, you are responsible for: As soon as  you are able, call our office [phone], at any time of the day or night, and leave a message stating your name, the date and nature of the emergency, and the name and dose of the medication prescribed. In the event that your call is answered by a member of our staff, make sure to document and save the date, time, and the name of the person that took your information.  Exception #2 (Planned Surgery): In the event that you are scheduled by another doctor or dentist to have any type of surgery or procedure, you are allowed (for a period no longer than 30 days), to receive additional pain medication, for the acute post-op pain. However, in this case, you are responsible for picking up a copy of our "Post-op Pain Management for Surgeons" handout, and giving it to your surgeon or dentist. This document is available at our office, and does not require an appointment to obtain it. Simply go to our office during business hours (Monday-Thursday from 8:00 AM to 4:00 PM) (Friday 8:00 AM to 12:00 Noon) or if you have a scheduled appointment with [place], prior to your surgery, and ask for it by name. In addition, you are responsible for: calling our office [phone], at any time of the day or night, and leaving a message stating your name, name of your surgeon, type of surgery, and date of procedure or surgery. Failure to comply with your responsibilities may result in termination of therapy involving the controlled substances. Medication Agreement Violation. Following the above rules, including your responsibilities will help you in avoiding a Medication Agreement Violation ("Breaking your Pain Medication Contract").  Consequences:  Not following the above rules may result in permanent discontinuation of medication prescription therapy.  *Opioid medications include: morphine, codeine, oxycodone, oxymorphone, hydrocodone, hydromorphone, meperidine, tramadol,  tapentadol, buprenorphine, fentanyl, methadone. **Benzodiazepine medications include: diazepam (Valium), alprazolam (Xanax), clonazepam (Klonopine), lorazepam (Ativan), clorazepate (Tranxene), chlordiazepoxide (Librium), estazolam (Prosom), oxazepam (Serax), temazepam (Restoril), triazolam (Halcion) (Last updated: 11/30/2021) ______________________________________________________________________    ______________________________________________________________________  Medication Recommendations and Reminders  Applies to: All patients receiving prescriptions (written and/or electronic).  Medication Rules & Regulations: You are responsible for reading, knowing, and following our "Medication Rules" document. These exist for your safety and that of others. They are not flexible and neither are we. Dismissing or ignoring them is an act of "non-compliance" that may result in complete and irreversible termination of such medication therapy. For safety reasons, "non-compliance" will not be tolerated. As with the [place] fundamental legal principle of "ignorance of the law is no defense", we will accept no excuses for not having read and knowing the content of documents provided to you by our practice.  Pharmacy of record:  Definition: This is the pharmacy where your electronic prescriptions will be sent.  We do not endorse any particular pharmacy. It is up to you and your insurance to decide what pharmacy to use.  We do not restrict you in your choice of pharmacy. However, once we write for your prescriptions, we will NOT be re-sending more prescriptions to fix restricted supply problems created by your pharmacy, or your insurance.  The pharmacy listed in the electronic medical record should be the one where you want electronic prescriptions to be sent. If you choose to change pharmacy, simply notify our nursing staff. Changes will be made only during your regular appointments and not over the  phone.  Recommendations: Keep all of your pain medications in a safe place, under lock and key, even  if you live alone. We will NOT replace lost, stolen, or damaged medication. We do not accept "Police Reports" as proof of medications having been stolen. After you fill your prescription, take 1 week's worth of pills and put them away in a safe place. You should keep a separate, properly labeled bottle for this purpose. The remainder should be kept in the original bottle. Use this as your primary supply, until it runs out. Once it's gone, then you know that you have 1 week's worth of medicine, and it is time to come in for a prescription refill. If you do this correctly, it is unlikely that you will ever run out of medicine. To make sure that the above recommendation works, it is very important that you make sure your medication refill appointments are scheduled at least 1 week before you run out of medicine. To do this in an effective manner, make sure that you do not leave the office without scheduling your next medication management appointment. Always ask the nursing staff to show you in your prescription , when your medication will be running out. Then arrange for the receptionist to get you a return appointment, at least 7 days before you run out of medicine. Do not wait until you have 1 or 2 pills left, to come in. This is very poor planning and does not take into consideration that we may need to cancel appointments due to bad weather, sickness, or emergencies affecting our staff. DO NOT ACCEPT A "Partial Fill": If for any reason your pharmacy does not have enough pills/tablets to completely fill or refill your prescription, do not allow for a "partial fill". The law allows the pharmacy to complete that prescription within 72 hours, without requiring a new prescription. If they do not fill the rest of your prescription within those 72 hours, you will need a separate prescription to fill the remaining  amount, which we will NOT provide. If the reason for the partial fill is your insurance, you will need to talk to the pharmacist about payment alternatives for the remaining tablets, but again, DO NOT ACCEPT A PARTIAL FILL, unless you can trust your pharmacist to obtain the remainder of the pills within 72 hours.  Prescription refills and/or changes in medication(s):  Prescription refills, and/or changes in dose or medication, will be conducted only during scheduled medication management appointments. (Applies to both, written and electronic prescriptions.) No refills on procedure days. No medication will be changed or started on procedure days. No changes, adjustments, and/or refills will be conducted on a procedure day. Doing so will interfere with the diagnostic portion of the procedure. No phone refills. No medications will be "called into the pharmacy". No Fax refills. No weekend refills. No Holliday refills. No after hours refills.  Remember:  Business hours are:  Monday to Thursday 8:00 AM to 4:00 PM Provider's Schedule: Delano Metz, MD - Appointments are:  Medication management: Monday and Wednesday 8:00 AM to 4:00 PM Procedure day: Tuesday and Thursday 7:30 AM to 4:00 PM Edward Jolly, MD - Appointments are:  Medication management: Tuesday and Thursday 8:00 AM to 4:00 PM Procedure day: Monday and Wednesday 7:30 AM to 4:00 PM (Last update: 11/30/2021) ______________________________________________________________________    ____________________________________________________________________________________________  Drug Holidays  What is a "Drug Holiday"? Drug Holiday: is the name given to the process of slowly tapering down and temporarily stopping the pain medication for the purpose of decreasing or eliminating tolerance to the drug.  Benefits Improved effectiveness Decreased required effective  dose Improved pain control End dependence on high dose  therapy Decrease cost of therapy Uncovering "opioid-induced hyperalgesia". (OIH)  What is "opioid hyperalgesia"? It is a paradoxical increase in pain caused by exposure to opioids. Stopping the opioid pain medication, contrary to the expected, it actually decreases or completely eliminates the pain. Ref.: "A comprehensive review of opioid-induced hyperalgesia". Donney Rankins, et.al. Pain Physician. 2011 Mar-Apr;14(2):145-61.  What is tolerance? Tolerance: the progressive loss of effectiveness of a pain medicine due to repetitive use. A common problem of opioid pain medications.  How long should a "Drug Holiday" last? Effectiveness depends on the patient staying off all opioid pain medicines for a minimum of 14 consecutive days. (2 weeks)  How about just taking less of the medicine? Does not work. Will not accomplish goal of eliminating the excess receptors.  How about switching to a different pain medicine? (AKA. "Opioid rotation") Does not work. Creates the illusion of effectiveness by taking advantage of inaccurate equivalent dose calculations between different opioids. -This "technique" was promoted by studies funded by [employer], such as [employer], creators of "OxyContin".  Can I stop the medicine "cold [place]"? We do not recommend it. You should always coordinate with your prescribing physician to make the transition as smoothly as possible. Avoid stopping the medicine abruptly without consulting. We recommend a "slow taper".  What is a slow taper? Taper: refers to the gradual decrease in dose.   How do I stop/taper the dose? Slowly. Decrease the daily amount of pills that you take by one (1) pill every seven (7) days. This is called a "slow downward taper". Example: if you normally take four (4) pills per day, drop it to three (3) pills per day for seven (7) days, then to two (2) pills per day for seven (7) days, then to one (1) per day for seven (7) days, and then  stop the medicine. The 14 day "Drug Holiday" starts on the first day without medicine.   Will I experience withdrawals? Unlikely with a slow taper.  What triggers withdrawals? Withdrawals are triggered by the sudden/abrupt stop of high dose opioids. Withdrawals can be avoided by slowly decreasing the dose over a prolonged period of time.  What are withdrawals? Symptoms associated with sudden/abrupt reduction/stopping of high-dose, long-term use of pain medication. Withdrawal are seldom seen on low dose therapy, or patients rarely taking opioid medication.  Early Withdrawal Symptoms may include: Agitation Anxiety Muscle aches Increased tearing Insomnia Runny nose Sweating Yawning  Late symptoms may include: Abdominal cramping Diarrhea Dilated pupils Goose bumps Nausea Vomiting  When could I see withdrawals? Onset: 8-24 hours after last use for most opioids. 12-48 hours for long-acting opioids (i.e.: methadone)  How long could they last? Duration: 4-10 days for most opioids. 14-21 days for long-acting opioids (i.e.: methadone)  What will happen after I complete my "Drug Holiday"? The need and indications for the opioid analgesic will be reviewed before restarting the medication. Dose requirements will likely decrease and the dose will need to be adjusted accordingly.   (Last update: 04/27/2022) ____________________________________________________________________________________________   ____________________________________________________________________________________________  Naloxone Nasal Spray  Why am I receiving this medication? [place] STOP ACT requires that all patients taking high dose opioids or at risk of opioids respiratory depression, be prescribed an opioid reversal agent, such as Naloxone (AKA: Narcan).  What is this medication? NALOXONE (nal OX one) treats opioid overdose, which causes slow or shallow breathing, severe drowsiness, or trouble  staying awake. Call emergency services after using  this medication. You may need additional treatment. Naloxone works by reversing the effects of opioids. It belongs to a group of medications called opioid blockers.  COMMON BRAND NAME(S): Kloxxado, Narcan  What should I tell my care team before I take this medication? They need to know if you have any of these conditions: Heart disease Substance use disorder An unusual or allergic reaction to naloxone, other medications, foods, dyes, or preservatives Pregnant or trying to get pregnant Breast-feeding  When to use this medication? This medication is to be used for the treatment of respiratory depression (less than 8 breaths per minute) secondary to opioid overdose.   How to use this medication? This medication is for use in the nose. Lay the person on their back. Support their neck with your hand and allow the head to tilt back before giving the medication. The nasal spray should be given into 1 nostril. After giving the medication, move the person onto their side. Do not remove or test the nasal spray until ready to use. Get emergency medical help right away after giving the first dose of this medication, even if the person wakes up. You should be familiar with how to recognize the signs and symptoms of a narcotic overdose. If more doses are needed, give the additional dose in the other nostril. Talk to your care team about the use of this medication in children. While this medication may be prescribed for children as young as newborns for selected conditions, precautions do apply.  Naloxone Overdosage: If you think you have taken too much of this medicine contact a poison control center or emergency room at once.  NOTE: This medicine is only for you. Do not share this medicine with others.  What if I miss a dose? This does not apply.  What may interact with this medication? This is only used during an emergency. No interactions are  expected during emergency use. This list may not describe all possible interactions. Give your health care provider a list of all the medicines, herbs, non-prescription drugs, or dietary supplements you use. Also tell them if you smoke, drink alcohol, or use illegal drugs. Some items may interact with your medicine.  What should I watch for while using this medication? Keep this medication ready for use in the case of an opioid overdose. Make sure that you have the phone number of your care team and local hospital ready. You may need to have additional doses of this medication. Each nasal spray contains a single dose. Some emergencies may require additional doses. After use, bring the treated person to the nearest hospital or call 911. Make sure the treating care team knows that the person has received a dose of this medication. You will receive additional instructions on what to do during and after use of this medication before an emergency occurs.  What side effects may I notice from receiving this medication? Side effects that you should report to your care team as soon as possible: Allergic reactions--skin rash, itching, hives, swelling of the face, lips, tongue, or throat Side effects that usually do not require medical attention (report these to your care team if they continue or are bothersome): Constipation Dryness or irritation inside the nose Headache Increase in blood pressure Muscle spasms Stuffy nose Toothache This list may not describe all possible side effects. Call your doctor for medical advice about side effects. You may report side effects to FDA at 1-800-FDA-1088.  Where should I keep my medication? Because this is  an emergency medication, you should keep it with you at all times.  Keep out of the reach of children and pets. Store between 20 and 25 degrees C (68 and 77 degrees F). Do not freeze. Throw away any unused medication after the expiration date. Keep in original  box until ready to use.  NOTE: This sheet is a summary. It may not cover all possible information. If you have questions about this medicine, talk to your doctor, pharmacist, or health care provider.   2023 Elsevier/Gold Standard (2020-10-02 00:00:00)  ____________________________________________________________________________________________

## 2022-11-14 ENCOUNTER — Encounter: Payer: Self-pay | Admitting: Pain Medicine

## 2022-11-14 ENCOUNTER — Ambulatory Visit: Payer: Commercial Managed Care - PPO | Attending: Pain Medicine | Admitting: Pain Medicine

## 2022-11-14 DIAGNOSIS — G894 Chronic pain syndrome: Secondary | ICD-10-CM | POA: Insufficient documentation

## 2022-11-14 DIAGNOSIS — M542 Cervicalgia: Secondary | ICD-10-CM | POA: Insufficient documentation

## 2022-11-14 DIAGNOSIS — M79672 Pain in left foot: Secondary | ICD-10-CM | POA: Insufficient documentation

## 2022-11-14 DIAGNOSIS — M25551 Pain in right hip: Secondary | ICD-10-CM | POA: Diagnosis present

## 2022-11-14 DIAGNOSIS — M545 Low back pain, unspecified: Secondary | ICD-10-CM | POA: Diagnosis present

## 2022-11-14 DIAGNOSIS — M47819 Spondylosis without myelopathy or radiculopathy, site unspecified: Secondary | ICD-10-CM | POA: Insufficient documentation

## 2022-11-14 DIAGNOSIS — G893 Neoplasm related pain (acute) (chronic): Secondary | ICD-10-CM | POA: Diagnosis present

## 2022-11-14 DIAGNOSIS — M79642 Pain in left hand: Secondary | ICD-10-CM | POA: Insufficient documentation

## 2022-11-14 DIAGNOSIS — R937 Abnormal findings on diagnostic imaging of other parts of musculoskeletal system: Secondary | ICD-10-CM | POA: Diagnosis present

## 2022-11-14 DIAGNOSIS — G8929 Other chronic pain: Secondary | ICD-10-CM | POA: Diagnosis present

## 2022-11-14 DIAGNOSIS — M79671 Pain in right foot: Secondary | ICD-10-CM | POA: Insufficient documentation

## 2022-11-14 DIAGNOSIS — Z79891 Long term (current) use of opiate analgesic: Secondary | ICD-10-CM | POA: Diagnosis present

## 2022-11-14 DIAGNOSIS — M47816 Spondylosis without myelopathy or radiculopathy, lumbar region: Secondary | ICD-10-CM | POA: Diagnosis present

## 2022-11-14 DIAGNOSIS — M25552 Pain in left hip: Secondary | ICD-10-CM | POA: Diagnosis present

## 2022-11-14 DIAGNOSIS — M2540 Effusion, unspecified joint: Secondary | ICD-10-CM | POA: Diagnosis present

## 2022-11-14 DIAGNOSIS — M25562 Pain in left knee: Secondary | ICD-10-CM | POA: Diagnosis present

## 2022-11-14 DIAGNOSIS — M4316 Spondylolisthesis, lumbar region: Secondary | ICD-10-CM | POA: Insufficient documentation

## 2022-11-14 DIAGNOSIS — M25561 Pain in right knee: Secondary | ICD-10-CM | POA: Diagnosis not present

## 2022-11-14 DIAGNOSIS — Z79899 Other long term (current) drug therapy: Secondary | ICD-10-CM | POA: Diagnosis present

## 2022-11-14 DIAGNOSIS — M79641 Pain in right hand: Secondary | ICD-10-CM | POA: Diagnosis present

## 2022-11-14 DIAGNOSIS — M51379 Other intervertebral disc degeneration, lumbosacral region without mention of lumbar back pain or lower extremity pain: Secondary | ICD-10-CM

## 2022-11-14 MED ORDER — OXYCODONE HCL 5 MG PO TABS
5.0000 mg | ORAL_TABLET | Freq: Two times a day (BID) | ORAL | 0 refills | Status: DC | PRN
Start: 2023-01-25 — End: 2023-02-21

## 2022-11-14 MED ORDER — OXYCODONE HCL 5 MG PO TABS
5.0000 mg | ORAL_TABLET | Freq: Two times a day (BID) | ORAL | 0 refills | Status: DC | PRN
Start: 2022-11-26 — End: 2023-02-21

## 2022-11-14 MED ORDER — OXYCODONE HCL 5 MG PO TABS
5.0000 mg | ORAL_TABLET | Freq: Two times a day (BID) | ORAL | 0 refills | Status: DC | PRN
Start: 2022-12-26 — End: 2023-02-21

## 2022-11-14 MED ORDER — NALOXONE HCL 4 MG/0.1ML NA LIQD
1.0000 | NASAL | 0 refills | Status: AC | PRN
Start: 2022-11-14 — End: 2023-11-14

## 2022-11-14 NOTE — Progress Notes (Signed)
Safety precautions to be maintained throughout the outpatient stay will include: orient to surroundings, keep bed in low position, maintain call bell within reach at all times, provide assistance with transfer out of bed and ambulation.   Nursing Pain Medication Assessment:  Safety precautions to be maintained throughout the outpatient stay will include: orient to surroundings, keep bed in low position, maintain call bell within reach at all times, provide assistance with transfer out of bed and ambulation.  Medication Inspection Compliance: Pill count conducted under aseptic conditions, in front of the patient. Neither the pills nor the bottle was removed from the patient's sight at any time. Once count was completed pills were immediately returned to the patient in their original bottle.  Medication: Oxycodone IR Pill/Patch Count:  5 of 60 pills remain Pill/Patch Appearance: Markings consistent with prescribed medication Bottle Appearance: Standard pharmacy container. Clearly labeled. Filled Date: 2 / 9 / 2024 Last Medication intake:  Today

## 2022-11-15 LAB — HM DIABETES EYE EXAM

## 2022-11-16 ENCOUNTER — Encounter: Payer: Self-pay | Admitting: Nurse Practitioner

## 2022-11-22 ENCOUNTER — Other Ambulatory Visit: Payer: Self-pay | Admitting: *Deleted

## 2022-11-22 DIAGNOSIS — M1711 Unilateral primary osteoarthritis, right knee: Secondary | ICD-10-CM

## 2022-11-29 ENCOUNTER — Inpatient Hospital Stay: Payer: Commercial Managed Care - PPO | Attending: Internal Medicine

## 2022-11-29 ENCOUNTER — Encounter: Payer: Self-pay | Admitting: Internal Medicine

## 2022-11-29 ENCOUNTER — Inpatient Hospital Stay: Payer: Commercial Managed Care - PPO

## 2022-11-29 ENCOUNTER — Inpatient Hospital Stay: Payer: Commercial Managed Care - PPO | Admitting: Internal Medicine

## 2022-11-29 VITALS — BP 112/71 | HR 85 | Temp 98.2°F | Ht 64.0 in | Wt 203.6 lb

## 2022-11-29 DIAGNOSIS — C50512 Malignant neoplasm of lower-outer quadrant of left female breast: Secondary | ICD-10-CM

## 2022-11-29 DIAGNOSIS — M1711 Unilateral primary osteoarthritis, right knee: Secondary | ICD-10-CM

## 2022-11-29 DIAGNOSIS — Z95828 Presence of other vascular implants and grafts: Secondary | ICD-10-CM

## 2022-11-29 DIAGNOSIS — Z170 Estrogen receptor positive status [ER+]: Secondary | ICD-10-CM | POA: Diagnosis not present

## 2022-11-29 DIAGNOSIS — Z79899 Other long term (current) drug therapy: Secondary | ICD-10-CM | POA: Insufficient documentation

## 2022-11-29 DIAGNOSIS — Z5111 Encounter for antineoplastic chemotherapy: Secondary | ICD-10-CM | POA: Insufficient documentation

## 2022-11-29 LAB — CBC WITH DIFFERENTIAL (CANCER CENTER ONLY)
Abs Immature Granulocytes: 0.04 10*3/uL (ref 0.00–0.07)
Basophils Absolute: 0 10*3/uL (ref 0.0–0.1)
Basophils Relative: 0 %
Eosinophils Absolute: 0 10*3/uL (ref 0.0–0.5)
Eosinophils Relative: 0 %
HCT: 37.1 % (ref 36.0–46.0)
Hemoglobin: 12 g/dL (ref 12.0–15.0)
Immature Granulocytes: 1 %
Lymphocytes Relative: 29 %
Lymphs Abs: 2.1 10*3/uL (ref 0.7–4.0)
MCH: 31.8 pg (ref 26.0–34.0)
MCHC: 32.3 g/dL (ref 30.0–36.0)
MCV: 98.4 fL (ref 80.0–100.0)
Monocytes Absolute: 0.3 10*3/uL (ref 0.1–1.0)
Monocytes Relative: 5 %
Neutro Abs: 4.8 10*3/uL (ref 1.7–7.7)
Neutrophils Relative %: 65 %
Platelet Count: 208 10*3/uL (ref 150–400)
RBC: 3.77 MIL/uL — ABNORMAL LOW (ref 3.87–5.11)
RDW: 14.3 % (ref 11.5–15.5)
WBC Count: 7.3 10*3/uL (ref 4.0–10.5)
nRBC: 0 % (ref 0.0–0.2)

## 2022-11-29 LAB — CMP (CANCER CENTER ONLY)
ALT: 21 U/L (ref 0–44)
AST: 22 U/L (ref 15–41)
Albumin: 3.6 g/dL (ref 3.5–5.0)
Alkaline Phosphatase: 65 U/L (ref 38–126)
Anion gap: 8 (ref 5–15)
BUN: 12 mg/dL (ref 6–20)
CO2: 26 mmol/L (ref 22–32)
Calcium: 9.2 mg/dL (ref 8.9–10.3)
Chloride: 106 mmol/L (ref 98–111)
Creatinine: 0.85 mg/dL (ref 0.44–1.00)
GFR, Estimated: >60 mL/min (ref >60)
Glucose, Bld: 197 mg/dL — ABNORMAL HIGH (ref 70–99)
Potassium: 3.5 mmol/L (ref 3.5–5.1)
Sodium: 140 mmol/L (ref 135–145)
Total Bilirubin: 0.4 mg/dL (ref 0.3–1.2)
Total Protein: 6.6 g/dL (ref 6.5–8.1)

## 2022-11-29 LAB — IRON AND TIBC
Iron: 70 ug/dL (ref 28–170)
Saturation Ratios: 23 % (ref 10.4–31.8)
TIBC: 309 ug/dL (ref 250–450)
UIBC: 239 ug/dL

## 2022-11-29 LAB — FERRITIN: Ferritin: 97 ng/mL (ref 11–307)

## 2022-11-29 LAB — VITAMIN D 25 HYDROXY (VIT D DEFICIENCY, FRACTURES): Vit D, 25-Hydroxy: 99.5 ng/mL (ref 30–100)

## 2022-11-29 MED ORDER — SODIUM CHLORIDE 0.9% FLUSH
10.0000 mL | Freq: Once | INTRAVENOUS | Status: AC
  Administered 2022-11-29: 10 mL via INTRAVENOUS
  Filled 2022-11-29: qty 10

## 2022-11-29 MED ORDER — GOSERELIN ACETATE 10.8 MG ~~LOC~~ IMPL
10.8000 mg | DRUG_IMPLANT | Freq: Once | SUBCUTANEOUS | Status: AC
  Administered 2022-11-29: 10.8 mg via SUBCUTANEOUS
  Filled 2022-11-29: qty 10.8

## 2022-11-29 MED ORDER — LETROZOLE 2.5 MG PO TABS: 2.5000 mg | ORAL_TABLET | Freq: Every day | ORAL | 1 refills | Status: DC

## 2022-11-29 MED ORDER — HEPARIN SOD (PORK) LOCK FLUSH 100 UNIT/ML IV SOLN
500.0000 [IU] | Freq: Once | INTRAVENOUS | Status: AC
Start: 2022-11-29 — End: 2022-11-29
  Administered 2022-11-29: 500 [IU] via INTRAVENOUS
  Filled 2022-11-29: qty 5

## 2022-11-29 NOTE — Progress Notes (Signed)
Her husband's insurance will cover her meds 100% if done for 90day supply.  Had a cold over the weekend, Nyquil.  Can you put a note in for her PCP to give her something for weight loss?

## 2022-11-29 NOTE — Progress Notes (Signed)
[facility] CONSULT NOTE  Patient Care Team: Marjie Skiff, NP as PCP - General (Nurse Practitioner) Meriam Sprague, MD (Inactive) as PCP - Cardiology (Cardiology) [name], Merrily Pew, MD (General Surgery) Benita Gutter, RN as Registered Nurse Sonia Side, Philbert Riser, [name] (Obstetrics and Gynecology) Earna Coder, MD as Consulting Physician (Oncology) Lyndle Herrlich, MD as Consulting Physician (Orthopedic Surgery)  CHIEF COMPLAINTS/PURPOSE OF CONSULTATION: Breast cancer  Oncology History Overview Note  # MAY 2019-  clinical stage IIIA (T3N1Mx) left breast cancer s/p biopsy on 06/14/2017. -Pathology revealed grade III invasive ductal carcinoma. -Axillary FNA revealed malignant cells c/w metastatic carcinoma. Tumor was ER + (90%), PR + (30%), Her2/neu - and Ki67 70%.  CA27.29 was 7.8 on 06/14/2017.  # She received 4 cycles of AC with Neulasta support (07/20/2017 - 08/31/2017).;  neoadjuvant Taxol on 09/14/2017.  #DEC 2019- Lumpectomy/sentinel lymph node biopsy [Dr.[name]]-complete pathologic response  # s/p RT [delayed sec to wound infection; Dr.[name]] finished RT [4/12]  # April 14th 2020- START TAM; stopped in mid-May secondary intolerance [severe migraines].  # 18th May 2020-start Arimidex [hormonal profile-postmenopausal;add Zoladex q3M]; STOPPED sec intolerance/joint pain; NOV 2021- STARTED AROMASIN; Stopped x 2 months sec to extreme fatigue/severe joint pains.   # July 2022- START tamoxifen 10 mg a day; Stopped Tamoxifen June 2024.   # JULY 23rd, 2024- START Letrozole   # PN-2 sec to taxol Ottis Stain management/ # may 2019- Endometrial sampling [Dr. Secord/Berchuck]-negative for malignancy/ # DM-2- poorly controlled.   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   # PAP SMEAR- RECOMMENDED 2022- summer  -------------------------------------------  DIAGNOSIS: left breast  cancer  STAGE:  III       ;GOALS: cure      Cancer of midline of breast, left (HCC) (Resolved)  06/15/2017 Initial Diagnosis   Cancer of midline of breast, left (HCC)   07/20/2017 - 11/16/2017 Chemotherapy   The patient had dexamethasone (DECADRON) 4 MG tablet, 1 of 1 cycle, Start date: --, End date: -- DOXOrubicin (ADRIAMYCIN) chemo injection 122 mg, 60 mg/m2 = 122 mg, Intravenous,  Once, 4 of 4 cycles Administration: 122 mg (07/20/2017), 122 mg (08/03/2017), 122 mg (08/17/2017), 122 mg (08/31/2017) palonosetron (ALOXI) injection 0.25 mg, 0.25 mg, Intravenous,  Once, 4 of 4 cycles Administration: 0.25 mg (07/20/2017), 0.25 mg (08/03/2017), 0.25 mg (08/17/2017), 0.25 mg (08/31/2017) pegfilgrastim (NEULASTA) injection 6 mg, 6 mg, Subcutaneous, Once, 5 of 5 cycles Administration: 6 mg (07/21/2017), 6 mg (08/04/2017), 6 mg (08/18/2017), 6 mg (09/01/2017) cyclophosphamide (CYTOXAN) 1,220 mg in sodium chloride 0.9 % 250 mL chemo infusion, 600 mg/m2 = 1,220 mg, Intravenous,  Once, 4 of 4 cycles Administration: 1,220 mg (07/20/2017), 1,220 mg (08/03/2017), 1,220 mg (08/17/2017), 1,220 mg (08/31/2017) PACLitaxel (TAXOL) 162 mg in sodium chloride 0.9 % 250 mL chemo infusion (</= 80mg /m2), 80 mg/m2 = 162 mg, Intravenous,  Once, 10 of 12 cycles Dose modification: 65 mg/m2 (original dose 80 mg/m2, Cycle 13, Reason: Provider Judgment, Comment: neuropathy) Administration: 162 mg (09/14/2017), 162 mg (09/21/2017), 162 mg (09/28/2017), 162 mg (10/05/2017), 162 mg (10/12/2017), 162 mg (10/19/2017), 162 mg (10/26/2017), 132 mg (11/02/2017), 132 mg (11/09/2017), 132 mg (11/16/2017) fosaprepitant (EMEND) 150 mg, dexamethasone (DECADRON) 12 mg in sodium chloride 0.9 % 145 mL IVPB, , Intravenous,  Once, 4 of 4 cycles Administration:  (07/20/2017),  (08/03/2017),  (08/17/2017),  (08/31/2017)  for chemotherapy treatment.    Malignant neoplasm of lower-outer quadrant of left  breast of female, estrogen receptor positive (HCC)  11/08/2017 Initial  Diagnosis   Malignant neoplasm of lower-outer quadrant of left breast of female, estrogen receptor positive (HCC)    HISTORY OF PRESENTING ILLNESS: Ambulating independently.  Alone.  Carolyn Roy 57 y.o.  female with a history of stage III ER PR positive HER-2/neu negative breast cancer currently on Letrozole [intolerance] plus Zoladexq3 /Zometa every 6 months is here for follow-up.   Her husband's insurance will cover her meds 100% if done for 90day supply. She is concerned about on going weight gain.   Complains of hot flashes; and also joint pains. Patient denies any new lumps or bumps.  Appetite is good.  No weight loss. Patient has been started on amitriptyline; by neurology.  She continues to be on Cymbalta.    Review of Systems  Constitutional:  Positive for malaise/fatigue. Negative for chills, diaphoresis, fever and weight loss.  HENT:  Negative for nosebleeds and sore throat.   Eyes:  Negative for double vision.  Respiratory:  Negative for cough, hemoptysis, sputum production, shortness of breath and wheezing.   Cardiovascular:  Negative for chest pain, palpitations, orthopnea and leg swelling.  Gastrointestinal:  Negative for abdominal pain, blood in stool, constipation, diarrhea, heartburn, melena, nausea and vomiting.  Genitourinary:  Negative for dysuria, frequency and urgency.  Musculoskeletal:  Positive for back pain and joint pain.  Skin: Negative.  Negative for itching and rash.  Neurological:  Positive for tingling. Negative for dizziness, focal weakness, weakness and headaches.  Endo/Heme/Allergies:  Does not bruise/bleed easily.  Psychiatric/Behavioral:  Negative for depression. The patient is not nervous/anxious and does not have insomnia.      MEDICAL HISTORY:  Past Medical History:  Diagnosis Date   Allergy    Breast cancer (HCC)    Cancer (HCC) 06/15/2017   5.1 cm, T3,N1 (clinical): ER/ PR positive, Her 2 neu not overexpressed, High Ki 67. Neuoadjuvant  chemotherapy.    Depression    Diabetes mellitus without complication (HCC) 2017   Edema of left upper extremity    Endometriosis    Family history of breast cancer    Headache    migraines   Hyperlipidemia    Lymphedema of left arm    Ovarian mass    Personal history of chemotherapy    Personal history of radiation therapy    Pneumonia    2018    SURGICAL HISTORY: Past Surgical History:  Procedure Laterality Date   AXILLARY LYMPH NODE BIOPSY Left 07/14/2017   Procedure: INSERTION GEL MARK CLIP LEFT AXILLA;  Surgeon: Earline Mayotte, MD;  Location: [facility] ORS;  Service: General;  Laterality: Left;   BREAST BIOPSY Left    Dr Victorio Palm BREAST METASTATIC CARCINOMA   BREAST LUMPECTOMY Left 01/12/2018   COLONOSCOPY WITH PROPOFOL N/A 12/27/2019   Procedure: COLONOSCOPY WITH PROPOFOL;  Surgeon: Pasty Spillers, MD;  Location: [facility] ENDOSCOPY;  Service: Endoscopy;  Laterality: N/A;   COLONOSCOPY WITH PROPOFOL N/A 01/05/2022   Procedure: COLONOSCOPY WITH PROPOFOL;  Surgeon: Toney Reil, MD;  Location: [facility] ENDOSCOPY;  Service: Gastroenterology;  Laterality: N/A;   OOPHORECTOMY     PARTIAL MASTECTOMY WITH NEEDLE LOCALIZATION Left 01/12/2018   Procedure: PARTIAL MASTECTOMY WITH NEEDLE LOCALIZATION;  Surgeon: Earline Mayotte, MD;  Location: [facility] ORS;  Service: General;  Laterality: Left;   PORTACATH PLACEMENT Right 07/14/2017   Procedure: INSERTION PORT-A-CATH;  Surgeon: Earline Mayotte, MD;  Location: [facility] ORS;  Service: General;  Laterality: Right;   SENTINEL  NODE BIOPSY Left 01/12/2018   Procedure: SENTINEL NODE BIOPSY;  Surgeon: Earline Mayotte, MD;  Location: [facility] ORS;  Service: General;  Laterality: Left;   TUBAL LIGATION      SOCIAL HISTORY: Social History   Socioeconomic History   Marital status: Married    Spouse name: Not on file   Number of children: Not on file   Years of education: Not on file   Highest education level: GED or equivalent   Occupational History   Not on file  Tobacco Use   Smoking status: Every Day    Current packs/day: 1.00    Average packs/day: 1 pack/day for 11.0 years (11.0 ttl pk-yrs)    Types: Cigarettes   Smokeless tobacco: Former    Types: Snuff  Vaping Use   Vaping status: Never Used  Substance and Sexual Activity   Alcohol use: No    Alcohol/week: 0.0 standard drinks of alcohol   Drug use: No   Sexual activity: Yes  Other Topics Concern   Not on file  Social History Narrative   ** Merged History Encounter **       Social Determinants of Health   Financial Resource Strain: Low Risk  (06/06/2022)   Overall Financial Resource Strain (CARDIA)    Difficulty of Paying Living Expenses: Not hard at all  Food Insecurity: Food Insecurity Present (06/06/2022)   Hunger Vital Sign    Worried About Running Out of Food in the Last Year: Sometimes true    Ran Out of Food in the Last Year: Sometimes true  Transportation Needs: No Transportation Needs (06/06/2022)   PRAPARE - Administrator, Civil Service (Medical): No    Lack of Transportation (Non-Medical): No  Physical Activity: Sufficiently Active (06/06/2022)   Exercise Vital Sign    Days of Exercise per Week: 5 days    Minutes of Exercise per Session: 150+ min  Stress: No Stress Concern Present (06/06/2022)   [employer] of Occupational Health - Occupational Stress Questionnaire    Feeling of Stress : Only a little  Social Connections: Socially Isolated (06/06/2022)   Social Connection and Isolation Panel [NHANES]    Frequency of Communication with Friends and Family: Once a week    Frequency of Social Gatherings with Friends and Family: Never    Attends Religious Services: Never    Diplomatic Services operational officer: No    Attends Engineer, structural: Not on file    Marital Status: Married  Catering manager Violence: Not on file    FAMILY HISTORY: Family History  Problem Relation Age of Onset   Colon  cancer Mother    Cancer Mother    Other Father        family hx on dad's side: breast, colon, stomach cancer-biological dad   Diabetes Brother    Pancreatitis Brother    Prostate cancer Brother 74       currently 61 / maternal half-brother   Breast cancer Maternal Aunt 32       currently 60   Breast cancer Maternal Grandmother 40       deceased 55s   Colon cancer Maternal Grandmother    Breast cancer Other 45       mother's sister; deceased 41   Breast cancer Other        mother's sister; age at dx unknown    ALLERGIES:  is allergic to aspirin.  MEDICATIONS:  Current Outpatient Medications  Medication Sig Dispense Refill  aspirin-acetaminophen-caffeine (EXCEDRIN MIGRAINE) 250-250-65 MG tablet Take 2 tablets by mouth daily as needed for headache.     Blood Glucose Monitoring Suppl (ONETOUCH VERIO) w/Device KIT Use to check blood sugar 3 times a day and document results, bring to appointments.  Goal is <130 fasting blood sugar and <180 two hours after meals. 1 kit 0   DULoxetine (CYMBALTA) 60 MG capsule Take 60 mg by mouth daily.     famotidine (PEPCID) 20 MG tablet TAKE 1 TABLET BY MOUTH EVERY DAY 90 tablet 3   glucose blood test strip Use to check blood sugar 3 times daily, fasting in morning with goal <130 and 2 hours after meals with goal <180.  Bring blood sugar log to visits. 100 each 12   goserelin (ZOLADEX) 3.6 MG injection Inject 3.6 mg into the skin every 28 (twenty-eight) days.     insulin glargine (LANTUS SOLOSTAR) 100 UNIT/ML Solostar Pen Inject 45 Units into the skin at bedtime. 15 mL 6   Insulin Pen Needle (BD PEN NEEDLE NANO 2ND GEN) 32G X 4 MM MISC For use with insulin pens daily. 100 each 12   metFORMIN (GLUCOPHAGE-XR) 500 MG 24 hr tablet START BY TAKING ONE TABLET (500 MG) TWICE A DAY WITH MEALS AND THEN INCREASE IN ONE WEEK TO TWO TABLETS (1000 MG) TWICE A DAY WITH MEALS. 360 tablet 1   naloxone (NARCAN) nasal spray 4 mg/0.1 mL Place 1 spray into the nose as needed  for up to 365 doses (for opioid-induced respiratory depresssion). In case of emergency (overdose), spray once into each nostril. If no response within 3 minutes, repeat application and call 911. 1 each 0   ondansetron (ZOFRAN) 8 MG tablet Take 1 tablet (8 mg total) by mouth 2 (two) times daily as needed for nausea or vomiting. 60 tablet 4   oxyCODONE (OXY IR/ROXICODONE) 5 MG immediate release tablet Take 1 tablet (5 mg total) by mouth 2 (two) times daily as needed for severe pain. Must last 30 days 60 tablet 0   [START ON 12/26/2022] oxyCODONE (OXY IR/ROXICODONE) 5 MG immediate release tablet Take 1 tablet (5 mg total) by mouth 2 (two) times daily as needed for severe pain. Must last 30 days 60 tablet 0   [START ON 01/25/2023] oxyCODONE (OXY IR/ROXICODONE) 5 MG immediate release tablet Take 1 tablet (5 mg total) by mouth 2 (two) times daily as needed for severe pain. Must last 30 days 60 tablet 0   pregabalin (LYRICA) 150 MG capsule TAKE 1 CAPSULE BY MOUTH TWICE A DAY 180 capsule 0   rosuvastatin (CRESTOR) 40 MG tablet TAKE 1 TABLET (40 MG TOTAL) BY MOUTH DAILY. STOP TAKING ATORVASTATIN. 90 tablet 3   sitaGLIPtin (JANUVIA) 100 MG tablet Take 1 tablet (100 mg total) by mouth daily. 90 tablet 4   SUMAtriptan (IMITREX) 100 MG tablet TAKE 1 TABLET BY MOUTH AS NEEDED FOR MIGRAINE. MAY REPEAT IN 2 HOURS IF HEADACHE PERSISTS OR RECURS AS DIRECTED. 10 tablet 0   Vitamin D, Ergocalciferol, (DRISDOL) 1.25 MG (50000 UNIT) CAPS capsule TAKE 1 CAPSULE BY MOUTH ONE TIME PER WEEK 12 capsule 1   letrozole (FEMARA) 2.5 MG tablet Take 1 tablet (2.5 mg total) by mouth daily. 90 tablet 1   No current facility-administered medications for this visit.   Facility-Administered Medications Ordered in Other Visits  Medication Dose Route Frequency Provider Last Rate Last Admin   heparin lock flush 100 unit/mL  500 Units Intravenous Once Rosey Bath, MD  sodium chloride flush (NS) 0.9 % injection 10 mL  10 mL  Intravenous Once Nelva Nay C, MD          .  PHYSICAL EXAMINATION: ECOG PERFORMANCE STATUS: 0 - Asymptomatic  Vitals:   11/29/22 1253  BP: 112/71  Pulse: 85  Temp: 98.2 F (36.8 C)  SpO2: 96%    Filed Weights   11/29/22 1253  Weight: 203 lb 9.6 oz (92.4 kg)     Physical Exam HENT:     Head: Normocephalic and atraumatic.     Mouth/Throat:     Pharynx: No oropharyngeal exudate.  Eyes:     Pupils: Pupils are equal, round, and reactive to light.  Cardiovascular:     Rate and Rhythm: Normal rate and regular rhythm.  Pulmonary:     Effort: No respiratory distress.     Breath sounds: No wheezing.  Abdominal:     General: Bowel sounds are normal. There is no distension.     Palpations: Abdomen is soft. There is no mass.     Tenderness: There is no abdominal tenderness. There is no guarding or rebound.  Musculoskeletal:        General: No tenderness. Normal range of motion.     Cervical back: Normal range of motion and neck supple.  Skin:    General: Skin is warm.     Comments:     Neurological:     Mental Status: She is alert and oriented to person, place, and time.  Psychiatric:        Mood and Affect: Affect normal.      LABORATORY DATA:  I have reviewed the data as listed Lab Results  Component Value Date   WBC 7.3 11/29/2022   HGB 12.0 11/29/2022   HCT 37.1 11/29/2022   MCV 98.4 11/29/2022   PLT 208 11/29/2022   Recent Labs    05/31/22 1432 08/30/22 1256 09/27/22 1328 11/29/22 1241  NA 134* 138 143 140  K 3.8 3.6 4.0 3.5  CL 100 106 103 106  CO2 25 23 26 26   GLUCOSE 429* 159* 210* 197*  BUN 13 15 16 12   CREATININE 0.92 1.11* 1.10* 0.85  CALCIUM 9.1 9.1 9.8 9.2  GFRNONAA >60 59*  --  >60  PROT 6.8 6.8 6.2 6.6  ALBUMIN 3.6 3.5 4.0 3.6  AST 17 22 19 22   ALT 13 17 16 21   ALKPHOS 76 57 83 65  BILITOT 0.3 0.4 0.3 0.4    RADIOGRAPHIC STUDIES: I have personally reviewed the radiological images as listed and agreed with the findings  in the report. No results found.  ASSESSMENT & PLAN:   Malignant neoplasm of lower-outer quadrant of left breast of female, estrogen receptor positive (HCC) # May 2019- HIGH RISK- LEFT  Breast cancer Stage III ER/PR pos her 2 NEG on OFS+ Letrozole. Also on -Adjuvant Zometa q6M [June 2022 x3 years]; Zoladex today q3M. BIL Mammogram August 2024 [Dr.Piscoya]- WNL. Currently on Letrozole- [facility]  # No clinical evidence of recurrence-  stable. Will stop zoladex after this visit.   # Mild Anemia- Hb 12.3- s/p PO gentle iron. Hx of colonoscopy- 2022 ? S/p colonoscopy [nov 2023;  Dr.Vanga. OCT 2023- 133; sat-19%.; continue linzess- stable   # Depression/ Insomnia--cymblata/OFF Amiytrplitine qhs /Dr.vaslow[DI-none as per uptodate*] PCP-  stable   # Bone health- DEC 2021- BMD T score: 0.3. Joint pains-multifactorial- Improved off Lyrica [As per pt ]- improved of tamoxifen- monitor closley on  Letrozole. Check vit D levels. Will order BMD.   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   # Lymphedema-left chest walls/p physical therapy- stable.   # DM-2 on insulin- poorly controlled  [A1c-6.8] Blood glucose - stable.   # Weight gain/obesity-  Discussed importance of healthy weight/and weight loss.  Strongly recommend eating more green leafy vegetables and cutting down processed food/ carbohydrates.  Instead increasing whole grains / protein in the diet.  Multiple studies have shown that optimal weight would help improve cardiovascular risk; also shown to cut on the risk of malignancies-colon cancer, breast cancer ovarian/uterine cancer in women. Defer to PCP for further management.   # Port malfunction: flush; s/p TPA-- # port/IV access- Stable Zometa q 85m; zola 3 m- stop 11/29/22  # DISPOSITION: # Zoladex today;  # Follow up in 3 month;  MD;port/ cbc/cmp;iron;estradiol;LH; FSH- ferritin- ca-27-29; Zometa; BMD prior- Dr.B    All questions were answered. The patient  knows to call the clinic with any problems, questions or concerns.    Earna Coder, MD 11/29/2022 2:04 PM

## 2022-11-29 NOTE — Assessment & Plan Note (Signed)
#   May 2019- HIGH RISK- LEFT  Breast cancer Stage III ER/PR pos her 2 NEG on OFS+ Letrozole. Also on -Adjuvant Zometa q6M [June 2022 x3 years]; Zoladex today q3M. BIL Mammogram August 2024 [Dr.Piscoya]- WNL. Currently on Letrozole- in [facility]UL[facility]24[facility]]  # No clinical evidence of recurrence-  stable. Will stop zoladex after this visit.   # Mild Anemia- Hb 12.3- s/p PO gentle iron. Hx of colonoscopy- 2022 ? S/p colonoscopy [nov 2023;  Dr.Vanga. OCT 2023- 133; sat-19%.; continue linzess- stable   # Depression/ Insomnia--cymblata/OFF Amiytrplitine qhs /Dr.vaslow[DI-none as per uptodate*] PCP-  stable   # Bone health- DEC 2021- BMD T score: 0.3. Joint pains-multifactorial- Improved off Lyrica [As per pt ]- improved of tamoxifen- monitor closley on Letrozole. Check vit D levels. Will order BMD.   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   # Lymphedema-left chest walls/p physical therapy- stable.   # DM-2 on insulin- poorly controlled  [A1c-6.8] Blood glucose - stable.   # Weight gain/obesity-  Discussed importance of healthy weight/and weight loss.  Strongly recommend eating more green leafy vegetables and cutting down processed food/ carbohydrates.  Instead increasing whole grains / protein in the diet.  Multiple studies have shown that optimal weight would help improve cardiovascular risk; also shown to cut on the risk of malignancies-colon cancer, breast cancer ovarian/uterine cancer in women. Defer to PCP for further management.   # Port malfunction: flush; s/p TPA-- # port/IV access- Stable Zometa q 8m; zola 3 m- stop 11/29/22  # DISPOSITION: # Zoladex today;  # Follow up in 3 month;  MD;port/ cbc/cmp;iron;estradiol;LH; FSH- ferritin- ca-27-29; Zometa; BMD prior- Dr.B

## 2022-11-30 ENCOUNTER — Other Ambulatory Visit: Payer: Commercial Managed Care - PPO

## 2022-11-30 ENCOUNTER — Ambulatory Visit: Payer: Commercial Managed Care - PPO | Admitting: Internal Medicine

## 2022-11-30 ENCOUNTER — Ambulatory Visit: Payer: Commercial Managed Care - PPO

## 2022-11-30 LAB — CANCER ANTIGEN 27.29: CA 27.29: <9 U/mL (ref 0.0–38.6)

## 2022-12-01 ENCOUNTER — Encounter: Payer: Self-pay | Admitting: Nurse Practitioner

## 2022-12-05 NOTE — Telephone Encounter (Signed)
Reached out to patient, she stated that she didn't feel like she needed an appointment that she was on it all weekend and it didn't seem to be giving her problems.  I advised that if she needed [place] to let us know.

## 2022-12-19 ENCOUNTER — Other Ambulatory Visit: Payer: Self-pay | Admitting: Nurse Practitioner

## 2022-12-20 NOTE — Telephone Encounter (Signed)
Requested Prescriptions  Pending Prescriptions Disp Refills   famotidine (PEPCID) 20 MG tablet [Pharmacy Med Name: FAMOTIDINE 20 MG TABLET] 90 tablet 0    Sig: TAKE 1 TABLET BY MOUTH EVERY DAY     Gastroenterology:  H2 Antagonists Passed - 12/19/2022 11:59 AM      Passed - Valid encounter within last 12 months    Recent Outpatient Visits           2 months ago Type 2 diabetes mellitus with hyperglycemia, with long-term current use of insulin (HCC)   [place] [facility] [place], Kidron T, NP   6 months ago Type 2 diabetes mellitus with hyperglycemia, with long-term current use of insulin (HCC)   [place] [facility] [place], [place] T, NP   9 months ago Type 2 diabetes mellitus with hyperglycemia, with long-term current use of insulin (HCC)   [facility] [place], [place] T, NP   1 year ago Type 2 diabetes mellitus with hyperglycemia, with long-term current use of insulin (HCC)   [place] [facility] [place], Alton T, NP   1 year ago Type 2 diabetes mellitus with hyperglycemia, with long-term current use of insulin (HCC)   [place] [facility] [place], Dorie Rank, NP       Future Appointments             In 2 weeks [place], Dorie Rank, NP [place] [facility]

## 2023-01-01 NOTE — Patient Instructions (Signed)

## 2023-01-03 ENCOUNTER — Ambulatory Visit: Payer: Commercial Managed Care - PPO | Admitting: Nurse Practitioner

## 2023-01-03 ENCOUNTER — Encounter: Payer: Self-pay | Admitting: Nurse Practitioner

## 2023-01-03 VITALS — BP 94/61 | HR 77 | Temp 98.3°F | Ht 64.0 in | Wt 199.0 lb

## 2023-01-03 DIAGNOSIS — G620 Drug-induced polyneuropathy: Secondary | ICD-10-CM | POA: Diagnosis not present

## 2023-01-03 DIAGNOSIS — G894 Chronic pain syndrome: Secondary | ICD-10-CM

## 2023-01-03 DIAGNOSIS — F19982 Other psychoactive substance use, unspecified with psychoactive substance-induced sleep disorder: Secondary | ICD-10-CM

## 2023-01-03 DIAGNOSIS — E1165 Type 2 diabetes mellitus with hyperglycemia: Secondary | ICD-10-CM | POA: Diagnosis not present

## 2023-01-03 DIAGNOSIS — Z794 Long term (current) use of insulin: Secondary | ICD-10-CM

## 2023-01-03 DIAGNOSIS — E1169 Type 2 diabetes mellitus with other specified complication: Secondary | ICD-10-CM

## 2023-01-03 DIAGNOSIS — I951 Orthostatic hypotension: Secondary | ICD-10-CM

## 2023-01-03 DIAGNOSIS — F322 Major depressive disorder, single episode, severe without psychotic features: Secondary | ICD-10-CM

## 2023-01-03 DIAGNOSIS — Z23 Encounter for immunization: Secondary | ICD-10-CM

## 2023-01-03 DIAGNOSIS — C50512 Malignant neoplasm of lower-outer quadrant of left female breast: Secondary | ICD-10-CM

## 2023-01-03 DIAGNOSIS — E66812 Obesity, class 2: Secondary | ICD-10-CM

## 2023-01-03 DIAGNOSIS — F17219 Nicotine dependence, cigarettes, with unspecified nicotine-induced disorders: Secondary | ICD-10-CM

## 2023-01-03 DIAGNOSIS — F1120 Opioid dependence, uncomplicated: Secondary | ICD-10-CM | POA: Diagnosis not present

## 2023-01-03 DIAGNOSIS — Z170 Estrogen receptor positive status [ER+]: Secondary | ICD-10-CM

## 2023-01-03 LAB — BAYER DCA HB A1C WAIVED: HB A1C (BAYER DCA - WAIVED): 7.9 % — ABNORMAL HIGH (ref 4.8–5.6)

## 2023-01-03 MED ORDER — ROSUVASTATIN CALCIUM 40 MG PO TABS: 40.0000 mg | ORAL_TABLET | Freq: Every day | ORAL | 4 refills | Status: DC

## 2023-01-03 MED ORDER — SITAGLIPTIN PHOSPHATE 100 MG PO TABS: 100.0000 mg | ORAL_TABLET | Freq: Every day | ORAL | 4 refills | Status: DC

## 2023-01-03 MED ORDER — LANTUS SOLOSTAR 100 UNIT/ML ~~LOC~~ SOPN: 45.0000 [IU] | PEN_INJECTOR | Freq: Every day | SUBCUTANEOUS | 4 refills | Status: DC

## 2023-01-03 MED ORDER — DULOXETINE HCL 60 MG PO CPEP: 60.0000 mg | ORAL_CAPSULE | Freq: Every day | ORAL | 4 refills | Status: DC

## 2023-01-03 NOTE — Assessment & Plan Note (Signed)
Chronic, ongoing.  Taking Duloxetine, continue this.  Will continue Amitriptyline as needed.  Currently denies SI/HI.  Recommend she schedule f/u with Dr. Maryruth Bun as needed in future if worsening mood.  At this time she reports being stable, going through grieving process and intends to start group therapy at church.

## 2023-01-03 NOTE — Assessment & Plan Note (Addendum)
Chronic, ongoing.  A1c 7.9% today, prior was 7.4%, trending up a little. Eye exam and foot exam up to date.  Cannot take Jardiance due to cost. Trulicity caused GI issues.   - Continue Lantus 45 units and back to 40 units if too many lows <70 with this change. -  Will continue Metformin.  Will work on PA for [employer], as not able to get at present. May benefit stopping DPP4 in future and retrial of Ozempic if cost effective and covered.   - Plan on PharmD referral if issues getting medications -  Recommend monitor BS at home TID and heavily focus on diet changes.  - Urine ALB 150 January 2024, can not take ACE/ARB due to hypotension.  Statin on board.   - Return in 3 months for A1c check.  Will have return to endo if note increased poor control.

## 2023-01-03 NOTE — Assessment & Plan Note (Signed)
Chronic, ongoing.  Continue current pain management collaboration and medication regimen as prescribed by them.

## 2023-01-03 NOTE — Assessment & Plan Note (Signed)
Ongoing, stable. Baseline BP between 90-110/60-70 on review going back to 2020.  Recommend to continue to take plenty of water intake daily and add some salt to diet + wear compression on during day and off at night.  She has seen cardiology with overall stable work-up. Would avoid HTN medications.

## 2023-01-03 NOTE — Assessment & Plan Note (Signed)
Followed by pain management, continue this collaboration, recent notes reviewed.

## 2023-01-03 NOTE — Assessment & Plan Note (Signed)
I have recommended complete cessation of tobacco use. I have discussed various options available for assistance with tobacco cessation including over the counter methods (Nicotine gum, patch and lozenges). We also discussed prescription options (Chantix, Nicotine Inhaler / Nasal Spray). The patient is not interested in pursuing any prescription tobacco cessation options at this time.  Recommend we start lung CT screening, she will plan on this next visit as has a lot going on at present.

## 2023-01-03 NOTE — Assessment & Plan Note (Signed)
BMI 34.16.  Recommended eating smaller high protein, low fat meals more frequently and exercising 30 mins a day 5 times a week with a goal of 10-15lb weight loss in the next 3 months. Patient voiced their understanding and motivation to adhere to these recommendations.

## 2023-01-03 NOTE — Assessment & Plan Note (Signed)
Ongoing.  Continue collaboration with oncology, recent note reviewed.

## 2023-01-03 NOTE — Progress Notes (Addendum)
BP 94/61   Pulse 77   Temp 98.3 F (36.8 C) (Oral)   Ht 5\' 4"  (1.626 m)   Wt 199 lb (90.3 kg)   LMP  (LMP Unknown) Comment: LAST PERIOD IN MAY 2019 WHEN SHE STARTED CHEMO  SpO2 95%   BMI 34.16 kg/m    Subjective:    Patient ID: Carolyn Roy, female    DOB: 08/24/1965, 57 y.o.   MRN: [id number]  HPI: Carolyn Roy is a 57 y.o. female  Chief Complaint  Patient presents with   Diabetes   Hyperlipidemia   Hypertension   Depression   Pain   DIABETES Last A1c in August was 7.4%, a trend down. Taking Metformin XR 1000 MG BID, and Lantus 45 units.  No steroid use recently.  Has been working on diet. Trulicity stopped due to GI effects and Jardiance was too costly + Januvia was not covered. Hypoglycemic episodes:no Polydipsia/polyuria: no Visual disturbance: no Chest pain: no Paresthesias: no Glucose Monitoring: yes  Accucheck frequency: BID  Fasting glucose: <100 in morning, lowest has been 72  Post prandial: 200 to 220  Evening:   Before meals:  Taking Insulin?: yes  Long acting insulin: 45 units  Short acting insulin: Blood Pressure Monitoring: not checking Retinal Examination: Up To Date -- Dr. Janett Billow Exam: Up to Date Pneumovax: Up To Date Influenza: Up To Date Aspirin: no   HYPERLIPIDEMIA Continues Rosuvastatin 40 MG daily.  Had sleep study at [facility] in 2023, she reports Dr. Barbaraann Cao told her they want to do a CPAP titration -- does not have CPAP at this time.  Never attained this.  Has been sleeping good.  Saw cardiology for this on 12/01/21 for hypotension, reassuring work-up. Is a smoker, 1/2 PPD.  Quit for 6 years, but then started back.  She started smoking at age 57, smoked >35 years total. Satisfied with current treatment? yes Duration of hyperlipidemia: chronic Cholesterol medication side effects: no Cholesterol supplements: none Aspirin: no Recent stressors: no Recurrent headaches: no Visual changes: no Palpitations:  no Dyspnea: no Chest pain: no Lower extremity edema: no Dizzy/lightheaded: no  The ASCVD Risk score (Arnett DK, et al., 2019) failed to calculate for the following reasons:   The valid total cholesterol range is 130 to 320 mg/dL   CHRONIC PAIN  Followed by pain clinic for chronic pain. Last visit was 08/22/22 continues Oxycodone and Pregabalin.  Well controlled. Pain control status: stable Duration: chronic Location: knees and back Quality: dull, aching, and throbbing Current Pain Level: 3/10 Previous Pain Level: 10/10 Breakthrough pain: no Benefit from narcotic medications: yes What Activities task can be accomplished with current medication? Able to work and perform ADLs Interested in weaning off narcotics:no   Stool softners/OTC fiber: yes  Previous pain specialty evaluation: yes Non-narcotic analgesic meds: no Narcotic contract: yes   DEPRESSION Continues Duloxetine 60 MG daily + Amitriptyline PRN.  Was followed by Dr. Maryruth Bun in past, her last visit was 10/25/21.  Took Lamictal in past, but she self stopped this months back.  Her brother passed away one week ago, this has caused increased anxiety and depression.  Is going to start going to the church group therapy.  Has breast cancer and last visit with oncology 08/30/22.  Was receiving infusions, every 3-6 months. She is off injection at this time. Mood status: stable  Satisfied with current treatment?: yes Symptom severity: stable to mild  Duration of current treatment : chronic Side effects: no Medication compliance:  good compliance Psychotherapy/counseling: none Depressed mood: yes Anxious mood: yes Anhedonia: no Significant weight loss or gain: no Insomnia: sleeping a little better Fatigue: no Feelings of worthlessness or guilt: no Impaired concentration/indecisiveness: no Suicidal ideations: no Hopelessness: no Crying spells: yes    01/03/2023    8:55 AM 09/27/2022    2:32 PM 06/06/2022    2:38 PM 05/25/2022     2:59 PM 03/07/2022   10:20 AM  Depression screen PHQ 2/9  Decreased Interest 3 0 1 0 0  Down, Depressed, Hopeless 3 1 1  0 1  PHQ - 2 Score 6 1 2  0 1  Altered sleeping 2 2 3  1   Tired, decreased energy 2 1 1  1   Change in appetite 2 0 3  1  Feeling bad or failure about yourself  3 0 0  0  Trouble concentrating 2 0 2  0  Moving slowly or fidgety/restless 1 0 2  0  Suicidal thoughts 0 0 0  0  PHQ-9 Score 18 4 13  4   Difficult doing work/chores Somewhat difficult Not difficult at all   Somewhat difficult      01/03/2023    8:55 AM 09/27/2022    2:33 PM 06/06/2022    2:38 PM 03/07/2022   10:21 AM  GAD 7 : Generalized Anxiety Score  Nervous, Anxious, on Edge 3 1 1  0  Control/stop worrying 3 1 0 0  Worry too much - different things 2 1 0 0  Trouble relaxing 2 0 1 0  Restless 2 0 1 0  Easily annoyed or irritable 3 1 1 1   Afraid - awful might happen 3 0 1 0  Total GAD 7 Score 18 4 5 1   Anxiety Difficulty Somewhat difficult Not difficult at all  Somewhat difficult   Relevant past medical, surgical, family and social history reviewed and updated as indicated. Interim medical history since our last visit reviewed. Allergies and medications reviewed and updated.  Review of Systems  Constitutional:  Negative for activity change, appetite change, fatigue and fever.  Respiratory:  Negative for cough, chest tightness and shortness of breath.   Cardiovascular:  Negative for chest pain, palpitations and leg swelling.  Gastrointestinal: Negative.   Endocrine: Negative for cold intolerance, heat intolerance, polydipsia, polyphagia and polyuria.  Genitourinary:  Negative for difficulty urinating.  Musculoskeletal:  Negative for myalgias.  Neurological: Negative.  Negative for dizziness and light-headedness.  Psychiatric/Behavioral:  Negative for decreased concentration, self-injury, sleep disturbance and suicidal ideas. The patient is nervous/anxious.    Per HPI unless specifically indicated  above     Objective:    BP 94/61   Pulse 77   Temp 98.3 F (36.8 C) (Oral)   Ht 5\' 4"  (1.626 m)   Wt 199 lb (90.3 kg)   LMP  (LMP Unknown) Comment: LAST PERIOD IN MAY 2019 WHEN SHE STARTED CHEMO  SpO2 95%   BMI 34.16 kg/m   Wt Readings from Last 3 Encounters:  01/03/23 199 lb (90.3 kg)  11/29/22 203 lb 9.6 oz (92.4 kg)  11/14/22 190 lb (86.2 kg)    Physical Exam Vitals and nursing note reviewed.  Constitutional:      General: She is awake. She is not in acute distress.    Appearance: Normal appearance. She is well-developed and well-groomed. She is obese. She is not ill-appearing or toxic-appearing.  HENT:     Head: Normocephalic.     Right Ear: Hearing and external ear normal.  Left Ear: Hearing and external ear normal.  Eyes:     General: Lids are normal.        Right eye: No discharge.        Left eye: No discharge.     Conjunctiva/sclera: Conjunctivae normal.     Pupils: Pupils are equal, round, and reactive to light.  Neck:     Thyroid: No thyromegaly.     Vascular: No carotid bruit.  Cardiovascular:     Rate and Rhythm: Normal rate and regular rhythm.     Heart sounds: Normal heart sounds. No murmur heard.    No gallop.  Pulmonary:     Effort: Pulmonary effort is normal. No accessory muscle usage or respiratory distress.     Breath sounds: Normal breath sounds.  Abdominal:     General: Bowel sounds are normal. There is no distension.     Palpations: Abdomen is soft.     Tenderness: There is no abdominal tenderness.  Musculoskeletal:     Cervical back: Normal range of motion and neck supple.     Right lower leg: No edema.     Left lower leg: No edema.  Lymphadenopathy:     Cervical: No cervical adenopathy.  Skin:    General: Skin is warm and dry.  Neurological:     Mental Status: She is alert and oriented to person, place, and time.     Deep Tendon Reflexes: Reflexes are normal and symmetric.     Reflex Scores:      Brachioradialis reflexes are 2+  on the right side and 2+ on the left side.      Patellar reflexes are 2+ on the right side and 2+ on the left side. Psychiatric:        Attention and Perception: Attention normal.        Mood and Affect: Mood normal.        Speech: Speech normal.        Behavior: Behavior normal. Behavior is cooperative.        Thought Content: Thought content normal.    Diabetic Foot Exam - Simple   Simple Foot Form Visual Inspection No deformities, no ulcerations, no other skin breakdown bilaterally: Yes Sensation Testing See comments: Yes Pulse Check Posterior Tibialis and Dorsalis pulse intact bilaterally: Yes Comments Reduced sensation to both feet -- 5/10     Results for orders placed or performed in visit on 11/29/22  VITAMIN D 25 Hydroxy (Vit-D Deficiency, Fractures)  Result Value Ref Range   Vit D, 25-Hydroxy 99.50 30 - 100 ng/mL  Cancer antigen 27.29  Result Value Ref Range   CA 27.29 <9.0 0.0 - 38.6 U/mL  Iron and TIBC  Result Value Ref Range   Iron 70 28 - 170 ug/dL   TIBC 284 132 - 440 ug/dL   Saturation Ratios 23 10.4 - 31.8 %   UIBC 239 ug/dL  Ferritin  Result Value Ref Range   Ferritin 97 11 - 307 ng/mL  CMP (Cancer Center only)  Result Value Ref Range   Sodium 140 135 - 145 mmol/L   Potassium 3.5 3.5 - 5.1 mmol/L   Chloride 106 98 - 111 mmol/L   CO2 26 22 - 32 mmol/L   Glucose, Bld 197 (H) 70 - 99 mg/dL   BUN 12 6 - 20 mg/dL   Creatinine 1.02 7.25 - 1.00 mg/dL   Calcium 9.2 8.9 - 36.6 mg/dL   Total Protein 6.6 6.5 - 8.1 g/dL  Albumin 3.6 3.5 - 5.0 g/dL   AST 22 15 - 41 U/L   ALT 21 0 - 44 U/L   Alkaline Phosphatase 65 38 - 126 U/L   Total Bilirubin 0.4 0.3 - 1.2 mg/dL   GFR, Estimated >95 >62 mL/min   Anion gap 8 5 - 15  CBC with Differential (Cancer Center Only)  Result Value Ref Range   WBC Count 7.3 4.0 - 10.5 K/uL   RBC 3.77 (L) 3.87 - 5.11 MIL/uL   Hemoglobin 12.0 12.0 - 15.0 g/dL   HCT 13.0 86.5 - 78.4 %   MCV 98.4 80.0 - 100.0 fL   MCH 31.8 26.0  - 34.0 pg   MCHC 32.3 30.0 - 36.0 g/dL   RDW 69.6 29.5 - 28.4 %   Platelet Count 208 150 - 400 K/uL   nRBC 0.0 0.0 - 0.2 %   Neutrophils Relative % 65 %   Neutro Abs 4.8 1.7 - 7.7 K/uL   Lymphocytes Relative 29 %   Lymphs Abs 2.1 0.7 - 4.0 K/uL   Monocytes Relative 5 %   Monocytes Absolute 0.3 0.1 - 1.0 K/uL   Eosinophils Relative 0 %   Eosinophils Absolute 0.0 0.0 - 0.5 K/uL   Basophils Relative 0 %   Basophils Absolute 0.0 0.0 - 0.1 K/uL   Immature Granulocytes 1 %   Abs Immature Granulocytes 0.04 0.00 - 0.07 K/uL      Assessment & Plan:   Problem List Items Addressed This Visit       Cardiovascular and Mediastinum   Hypotension    Ongoing, stable. Baseline BP between 90-110/60-70 on review going back to 2020.  Recommend to continue to take plenty of water intake daily and add some salt to diet + wear compression on during day and off at night.  She has seen cardiology with overall stable work-up. Would avoid HTN medications.      Relevant Medications   rosuvastatin (CRESTOR) 40 MG tablet     Endocrine   Hyperlipidemia associated with type 2 diabetes mellitus (HCC)    Chronic, ongoing.  Continue Rosuvastatin 40 MG daily.  Lipid panel today and adjust dose as needed.      Relevant Medications   rosuvastatin (CRESTOR) 40 MG tablet   insulin glargine (LANTUS SOLOSTAR) 100 UNIT/ML Solostar Pen   sitaGLIPtin (JANUVIA) 100 MG tablet   Other Relevant Orders   Bayer DCA Hb A1c Waived   Comprehensive metabolic panel   Lipid Panel w/o Chol/HDL Ratio   Type 2 diabetes mellitus with hyperglycemia, with long-term current use of insulin (HCC) - Primary    Chronic, ongoing.  A1c 7.9% today, prior was 7.4%, trending up a little. Eye exam and foot exam up to date.  Cannot take Jardiance due to cost. Trulicity caused GI issues.   - Continue Lantus 45 units and back to 40 units if too many lows <70 with this change. -  Will continue Metformin.  Will work on PA for [employer], as not able  to get at present. May benefit stopping DPP4 in future and retrial of Ozempic if cost effective and covered.   - Plan on PharmD referral if issues getting medications -  Recommend monitor BS at home TID and heavily focus on diet changes.  - Urine ALB 150 January 2024, can not take ACE/ARB due to hypotension.  Statin on board.   - Return in 3 months for A1c check.  Will have return to endo if note increased poor  control.      Relevant Medications   rosuvastatin (CRESTOR) 40 MG tablet   insulin glargine (LANTUS SOLOSTAR) 100 UNIT/ML Solostar Pen   sitaGLIPtin (JANUVIA) 100 MG tablet   Other Relevant Orders   Bayer DCA Hb A1c Waived     Nervous and Auditory   Chemotherapy-induced neuropathy (HCC) (Chronic)    Chronic, ongoing.  Continue current pain management collaboration and medication regimen as prescribed by them.      Relevant Medications   DULoxetine (CYMBALTA) 60 MG capsule   Nicotine dependence, cigarettes, w unsp disorders    I have recommended complete cessation of tobacco use. I have discussed various options available for assistance with tobacco cessation including over the counter methods (Nicotine gum, patch and lozenges). We also discussed prescription options (Chantix, Nicotine Inhaler / Nasal Spray). The patient is not interested in pursuing any prescription tobacco cessation options at this time.  Recommend we start lung CT screening, she will plan on this next visit as has a lot going on at present.         Other   Chronic pain syndrome (Chronic)    Followed by pain management, continue this collaboration, recent notes reviewed.      Relevant Medications   DULoxetine (CYMBALTA) 60 MG capsule   Uncomplicated opioid dependence (HCC) (Chronic)    Followed by pain management, continue this collaboration, recent notes reviewed.      Malignant neoplasm of lower-outer quadrant of left breast of female, estrogen receptor positive (HCC)    Ongoing.  Continue  collaboration with oncology, recent note reviewed.      Obesity    BMI 34.16.  Recommended eating smaller high protein, low fat meals more frequently and exercising 30 mins a day 5 times a week with a goal of 10-15lb weight loss in the next 3 months. Patient voiced their understanding and motivation to adhere to these recommendations.       Relevant Medications   insulin glargine (LANTUS SOLOSTAR) 100 UNIT/ML Solostar Pen   sitaGLIPtin (JANUVIA) 100 MG tablet   Severe depression (HCC)    Chronic, ongoing.  Taking Duloxetine, continue this.  Will continue Amitriptyline as needed.  Currently denies SI/HI.  Recommend she schedule f/u with Dr. Maryruth Bun as needed in future if worsening mood.  At this time she reports being stable, going through grieving process and intends to start group therapy at church.      Relevant Medications   DULoxetine (CYMBALTA) 60 MG capsule     Follow up plan: Return in about 3 months (around 04/05/2023) for T2DM, HTN/HLD.

## 2023-01-03 NOTE — Assessment & Plan Note (Signed)
Chronic, ongoing.  Continue Rosuvastatin 40 MG daily.  Lipid panel today and adjust dose as needed.

## 2023-01-04 ENCOUNTER — Telehealth: Payer: Self-pay

## 2023-01-04 ENCOUNTER — Encounter: Payer: Self-pay | Admitting: Nurse Practitioner

## 2023-01-04 LAB — COMPREHENSIVE METABOLIC PANEL
ALT: 14 [IU]/L (ref 0–32)
AST: 16 [IU]/L (ref 0–40)
Albumin: 3.8 g/dL (ref 3.8–4.9)
Alkaline Phosphatase: 78 [IU]/L (ref 44–121)
BUN/Creatinine Ratio: 12 (ref 9–23)
BUN: 10 mg/dL (ref 6–24)
Bilirubin Total: 0.4 mg/dL (ref 0.0–1.2)
CO2: 26 mmol/L (ref 20–29)
Calcium: 9.3 mg/dL (ref 8.7–10.2)
Chloride: 105 mmol/L (ref 96–106)
Creatinine, Ser: 0.81 mg/dL (ref 0.57–1.00)
Globulin, Total: 2.2 g/dL (ref 1.5–4.5)
Glucose: 208 mg/dL — ABNORMAL HIGH (ref 70–99)
Potassium: 3.6 mmol/L (ref 3.5–5.2)
Sodium: 144 mmol/L (ref 134–144)
Total Protein: 6 g/dL (ref 6.0–8.5)
eGFR: 86 mL/min/{1.73_m2} (ref >59)

## 2023-01-04 LAB — LIPID PANEL W/O CHOL/HDL RATIO
Cholesterol, Total: 102 mg/dL (ref 100–199)
HDL: 43 mg/dL (ref >39)
LDL Chol Calc (NIH): 41 mg/dL (ref 0–99)
Triglycerides: 90 mg/dL (ref 0–149)
VLDL Cholesterol Cal: 18 mg/dL (ref 5–40)

## 2023-01-04 MED ORDER — BUPROPION HCL ER (XL) 150 MG PO TB24: 150.0000 mg | ORAL_TABLET | Freq: Every day | ORAL | 1 refills | Status: DC

## 2023-01-04 NOTE — Telephone Encounter (Signed)
Per provider called patient to schedule 4 week follow-up for mood. Left message for patient to call back and schedule appointment.

## 2023-01-04 NOTE — Progress Notes (Signed)
Contacted via MyChart   Good evening Carolyn Roy, your labs have returned and overall look great with exception of elevation in glucose which goes with your current A1c.  Continue all current medications.  Any questions? Keep being amazing!!  Thank you for allowing me to participate in your care.  I appreciate you. Kindest regards, Mayuri Staples

## 2023-01-04 NOTE — Telephone Encounter (Signed)
PA for Januvia submitted and approved via Cover My Meds. Key: [id number]  Called and LVM notifying patient of approval.

## 2023-01-11 ENCOUNTER — Other Ambulatory Visit: Payer: Self-pay | Admitting: Nurse Practitioner

## 2023-01-12 ENCOUNTER — Other Ambulatory Visit: Payer: Self-pay | Admitting: Nurse Practitioner

## 2023-01-12 MED ORDER — REZVOGLAR KWIKPEN 100 UNIT/ML ~~LOC~~ SOPN: 50.0000 [IU] | PEN_INJECTOR | Freq: Every evening | SUBCUTANEOUS | 4 refills | Status: DC

## 2023-01-12 NOTE — Telephone Encounter (Signed)
Requested medication (s) are due for refill today: see below  Requested medication (s) are on the active medication list: yes  Last refill:  01/03/23 #39/4  Future visit scheduled: yes  Notes to clinic:   Pharmacy comment: Alternative Requested:THE PRESCRIBED MEDICATION IS NOT COVERED BY INSURANCE. PLEASE CONSIDER CHANGING TO ONE OF THE SUGGESTED COVERED ALTERNATIVES.   All Pharmacy Suggested Alternatives:  insulin glargine (LANTUS) 100 UNIT/ML injection Insulin Glargine-aglr (REZVOGLAR KWIKPEN) 100 UNIT/ML SOPN       Requested Prescriptions  Pending Prescriptions Disp Refills   LANTUS 100 UNIT/ML injection [Pharmacy Med Name: LANTUS 100 UNIT/ML VIAL]  0     Endocrinology:  Diabetes - Insulins Passed - 01/11/2023 12:53 PM      Passed - HBA1C is between 0 and 7.9 and within 180 days    HB A1C (BAYER DCA - WAIVED)  Date Value Ref Range Status  01/03/2023 7.9 (H) 4.8 - 5.6 % Final    Comment:             Prediabetes: 5.7 - 6.4          Diabetes: >6.4          Glycemic control for adults with diabetes: <7.0          Passed - Valid encounter within last 6 months    Recent Outpatient Visits           1 week ago Type 2 diabetes mellitus with hyperglycemia, with long-term current use of insulin (HCC)   [place] [facility] [place], [place] T, NP   3 months ago Type 2 diabetes mellitus with hyperglycemia, with long-term current use of insulin (HCC)   [place] [facility] [place], [place] T, NP   7 months ago Type 2 diabetes mellitus with hyperglycemia, with long-term current use of insulin (HCC)   [place] [facility] [place], [place] T, NP   10 months ago Type 2 diabetes mellitus with hyperglycemia, with long-term current use of insulin (HCC)   [place] [facility] [place], [place] T, NP   1 year ago Type 2 diabetes mellitus with hyperglycemia, with long-term current use of insulin (HCC)   [place] [facility] [place], Dorie Rank, NP       Future Appointments             In 2 months Cannady, Dorie Rank, NP  [facility], [facility]

## 2023-01-12 NOTE — Telephone Encounter (Signed)
[facility] Medication Refill Request  Last appointment for diabetes management within 6 months (mandatory) YES  Last 3 relevant diabetes mellitus labs: Lab Results  Component Value Date   HGBA1C 7.9 (H) 01/03/2023   HGBA1C 7.4 (H) 09/27/2022   HGBA1C 8.7 (H) 06/06/2022   Lab Results  Component Value Date   MICROALBUR 150 (H) 03/07/2022   LDLCALC 41 01/03/2023   CREATININE 0.81 01/03/2023     Last office visit: 01/04/2023   Next follow up visit: 04/03/2023

## 2023-01-13 NOTE — Telephone Encounter (Signed)
Requested Prescriptions  Refused Prescriptions Disp Refills   insulin glargine (LANTUS) 100 UNIT/ML injection [Pharmacy Med Name: LANTUS 100 UNIT/ML VIAL]  0     Endocrinology:  Diabetes - Insulins Passed - 01/12/2023  5:18 PM      Passed - HBA1C is between 0 and 7.9 and within 180 days    HB A1C (BAYER DCA - WAIVED)  Date Value Ref Range Status  01/03/2023 7.9 (H) 4.8 - 5.6 % Final    Comment:             Prediabetes: 5.7 - 6.4          Diabetes: >6.4          Glycemic control for adults with diabetes: <7.0          Passed - Valid encounter within last 6 months    Recent Outpatient Visits           1 week ago Type 2 diabetes mellitus with hyperglycemia, with long-term current use of insulin (HCC)   [place] [facility] [place], [place] T, NP   3 months ago Type 2 diabetes mellitus with hyperglycemia, with long-term current use of insulin (HCC)   [place] [facility] [place], [place] T, NP   7 months ago Type 2 diabetes mellitus with hyperglycemia, with long-term current use of insulin (HCC)   [place] [facility] [place], [place] T, NP   10 months ago Type 2 diabetes mellitus with hyperglycemia, with long-term current use of insulin (HCC)   [place] [facility] [place], [place] T, NP   1 year ago Type 2 diabetes mellitus with hyperglycemia, with long-term current use of insulin (HCC)   [facility] [place], Dorie Rank, NP       Future Appointments             In 2 months Cannady, Dorie Rank, NP  [facility], [facility]

## 2023-01-16 ENCOUNTER — Other Ambulatory Visit: Payer: Self-pay | Admitting: Nurse Practitioner

## 2023-01-16 ENCOUNTER — Encounter: Payer: Self-pay | Admitting: Nurse Practitioner

## 2023-01-16 MED ORDER — GABAPENTIN 300 MG PO CAPS: 300.0000 mg | ORAL_CAPSULE | Freq: Two times a day (BID) | ORAL | 4 refills | Status: DC

## 2023-01-28 ENCOUNTER — Other Ambulatory Visit: Payer: Self-pay | Admitting: Nurse Practitioner

## 2023-01-31 NOTE — Telephone Encounter (Signed)
Requested medication (s) are due for refill today:   Yes  Requested medication (s) are on the active medication list:   Yes  Future visit scheduled:   Yes.    LOV 01/03/2023 with Jolene   Last ordered: 01/04/2023 #30, 1 refill   A 90 day supply is being requested.   I could not find any mention of this in OV note whether this is a new rx or not.     Requested Prescriptions  Pending Prescriptions Disp Refills   buPROPion (WELLBUTRIN XL) 150 MG 24 hr tablet [Pharmacy Med Name: BUPROPION HCL XL 150 MG TABLET] 90 tablet 1    Sig: TAKE 1 TABLET BY MOUTH EVERY DAY     Psychiatry: Antidepressants - bupropion Passed - 01/31/2023  7:41 AM      Passed - Cr in normal range and within 360 days    Creatinine  Date Value Ref Range Status  11/29/2022 0.85 0.44 - 1.00 mg/dL Final   Creatinine, Ser  Date Value Ref Range Status  01/03/2023 0.81 0.57 - 1.00 mg/dL Final         Passed - AST in normal range and within 360 days    AST  Date Value Ref Range Status  01/03/2023 16 0 - 40 IU/L Final  11/29/2022 22 15 - 41 U/L Final         Passed - ALT in normal range and within 360 days    ALT  Date Value Ref Range Status  01/03/2023 14 0 - 32 IU/L Final  11/29/2022 21 0 - 44 U/L Final         Passed - Completed PHQ-2 or PHQ-9 in the last 360 days      Passed - Last BP in normal range    BP Readings from Last 1 Encounters:  01/03/23 94/61         Passed - Valid encounter within last 6 months    Recent Outpatient Visits           4 weeks ago Type 2 diabetes mellitus with hyperglycemia, with long-term current use of insulin (HCC)   [place] [facility] [place], [place] T, NP   4 months ago Type 2 diabetes mellitus with hyperglycemia, with long-term current use of insulin (HCC)   [place] [facility] [place], [place] T, NP   7 months ago Type 2 diabetes mellitus with hyperglycemia, with long-term current use of insulin (HCC)   [facility] [place] [facility], [place] T, NP   11 months ago Type 2 diabetes mellitus with hyperglycemia, with long-term current use of insulin (HCC)   [place] [facility] [place], [place] T, NP   1 year ago Type 2 diabetes mellitus with hyperglycemia, with long-term current use of insulin (HCC)   [place] [facility] [place], Dorie Rank, NP       Future Appointments             In 2 months Cannady, Dorie Rank, NP [place] [facility]

## 2023-02-08 ENCOUNTER — Other Ambulatory Visit: Payer: Self-pay | Admitting: Medical Genetics

## 2023-02-21 NOTE — Progress Notes (Signed)
 PROVIDER NOTE: Information contained herein reflects review and annotations entered in association with encounter. Interpretation of such information and data should be left to medically-trained personnel. Information provided to patient can be located elsewhere in the medical record under "Patient Instructions". Document created using STT-dictation technology, any transcriptional errors that may result from process are unintentional.    Patient: Carolyn Roy  Service Category: E/M  Provider: Candi Chafe, MD  DOB: 12-Jul-1965  DOS: 02/22/2023  Referring Provider: Lemar Pyles, NP  MRN: [id number]  Specialty: Interventional Pain Management  PCP: Lemar Pyles, NP  Type: Established Patient  Setting: Ambulatory outpatient    Location: Office  Delivery: Face-to-face     HPI  Carolyn Roy, a 58 y.o. year old female, is here today because of her No primary diagnosis found.. Carolyn Roy primary complain today is Knee Pain (bilateral)  Pertinent problems: Carolyn Roy has Chronic fatigue; Family history of breast cancer; Chemotherapy-induced neuropathy (HCC); Malignant neoplasm of lower-outer quadrant of left breast of female, estrogen receptor positive (HCC); Chronic feet pain (1ry area of Pain) (Bilateral) (R>L); Neuropathic pain of feet (Bilateral); Chronic knee pain (2ry area of Pain) (Bilateral) (R>L); Chronic hand pain (3ry area of Pain) (Bilateral) (R>L); Chronic pain syndrome; Chronic hip pain (4th area of Pain) (Bilateral) (R>L); Chronic upper extremity pain (3ry area of Pain) (Bilateral) (R>L); Cancer-related pain; Neuropathic pain; Osteoarthritis of knee (Right); Chronic low back pain (Bilateral (L>R) w/o sciatica; Chronic hip pain (Left); Lumbar facet syndrome (Bilateral) (L>R); Idiopathic scoliosis; Chronic sacroiliac joint pain (Left); Chronic pain of knee (Right); Abnormal MRI, lumbar spine (04/20/2018); Lumbar facet arthropathy; Spondylosis without myelopathy or  radiculopathy, lumbosacral region; DDD (degenerative disc disease), lumbosacral; Numbness and tingling of upper extremity (C6/C7 dermatomes) (Right); Personal history of breast cancer; Cervicalgia; Cervical radiculitis (Right); Effusion of knee joint (Right); Tricompartment osteoarthritis of knee (Right); Migraine headache without aura; 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 Pain Assessment: Severity of Chronic pain is reported as a 1 /10. Location: Knee Right, Left/denies. Onset: More than a month ago. Quality: Aching, Burning, Dull. Timing: Intermittent. Modifying factor(s): medications, rest. Vitals:  height is 5\' 4"  (1.626 m) and weight is 198 lb (89.8 kg). Her temperature is 97.2 F (36.2 C) (abnormal). Her blood pressure is 133/82 and her pulse is 93. Her respiration is 16 and oxygen saturation is 99%.  BMI: Estimated body mass index is 33.99 kg/m as calculated from the following:   Height as of this encounter: 5\' 4"  (1.626 m).   Weight as of this encounter: 198 lb (89.8 kg). Last encounter: 11/14/2022. Last procedure: Visit date not found.  Reason for encounter: medication management.  The patient indicates doing well with the current medication regimen. No adverse reactions or side effects reported to the medications.   Discussed the use of AI scribe software for clinical note transcription with the patient, who gave verbal consent to proceed.  History of Present Illness   The patient, compliant with her pain management regimen, reports no adverse reactions or side effects. She has been experiencing intermittent knee pain, predominantly in the right knee, but occasionally in both. The pain is described as a tearing sensation, particularly when kneeling, and is located throughout the knee and in the back of the knee. The pain is associated with a burning  sensation. The patient notes that walking does not exacerbate  the pain. The patient's knee pain appears to be influenced by cold weather. She has been managing the pain by avoiding kneeling and maintaining a healthy weight.     RTCB: 05/25/2023   Pharmacotherapy Assessment  Analgesic: Oxycodone  IR 5 mg, 1 tab PO BID (10 mg/day of oxycodone ) (Average: 2 tabs/day = 10 mg/day of oxycodone ) MME/day: 15 mg/day.   Monitoring: [place] PMP: PDMP reviewed during this encounter.       Pharmacotherapy: No side-effects or adverse reactions reported. Compliance: No problems identified. Effectiveness: Clinically acceptable.  Sibyl Drafts, RN  02/22/2023 11:54 AM  Sign when Signing Visit Nursing Pain Medication Assessment:  Safety precautions to be maintained throughout the outpatient stay will include: orient to surroundings, keep bed in low position, maintain call bell within reach at all times, provide assistance with transfer out of bed and ambulation.  Medication Inspection Compliance: Pill count conducted under aseptic conditions, in front of the patient. Neither the pills nor the bottle was removed from the patient's sight at any time. Once count was completed pills were immediately returned to the patient in their original bottle.  Medication: Oxycodone  IR Pill/Patch Count:  55 of 60 pills remain Pill/Patch Appearance: Markings consistent with prescribed medication Bottle Appearance: Standard pharmacy container. Clearly labeled. Filled Date: 1 / 74 / 2025 Last Medication intake:  Today    No results found for: "CBDTHCR" No results found for: "D8THCCBX" No results found for: "D9THCCBX"  UDS:  Summary  Date Value Ref Range Status  08/22/2022 Note  Final    Comment:    ==================================================================== [employer] [facility] (MW) ==================================================================== Test                             Result       Flag        Units  Drug Present and Declared for Prescription Verification   Oxycodone                       1803         EXPECTED   ng/mg creat   Oxymorphone                    74           EXPECTED   ng/mg creat   Noroxycodone                   2697         EXPECTED   ng/mg creat   Noroxymorphone                 82           EXPECTED   ng/mg creat    Sources of oxycodone  are scheduled prescription medications.    Oxymorphone, noroxycodone, and noroxymorphone are expected    metabolites of oxycodone . Oxymorphone is also available as a    scheduled prescription medication.  ==================================================================== Test                      Result    Flag   Units      Ref Range   Creatinine              119              mg/dL      >=16 ==================================================================== Declared Medications:  The flagging and interpretation on this report are  based on the  following declared medications.  Unexpected results may arise from  inaccuracies in the declared medications.   **Note: The testing scope of this panel includes these medications:   Oxycodone  (Roxicodone )   **Note: The testing scope of this panel does not include the  following reported medications:   Acetaminophen  (Excedrin)  Aspirin  (Excedrin)  Caffeine (Excedrin)  Duloxetine  (Cymbalta )  Famotidine  (Pepcid )  Goserelin  Insulin  (Lantus )  Metformin  (Glucophage )  Naloxone  (Narcan )  Ondansetron  (Zofran )  Pregabalin  (Lyrica )  Rosuvastatin  (Crestor )  Sitagliptin  (Januvia )  Sumatriptan  (Imitrex )  Tamoxifen  (Nolvadex )  Vitamin D2 (Drisdol ) ==================================================================== For clinical consultation, please call [phone]. ====================================================================       ROS  Constitutional: Denies any fever or chills Gastrointestinal: No reported hemesis, hematochezia, vomiting, or acute GI  distress Musculoskeletal: Denies any acute onset joint swelling, redness, loss of ROM, or weakness Neurological: No reported episodes of acute onset apraxia, aphasia, dysarthria, agnosia, amnesia, paralysis, loss of coordination, or loss of consciousness  Medication Review  DULoxetine , Insulin  Glargine-aglr, Insulin  Pen Needle, OneTouch Verio, SUMAtriptan , Vitamin D  (Ergocalciferol ), aspirin -acetaminophen -caffeine, buPROPion , famotidine , gabapentin , glucose blood, letrozole , metFORMIN , naloxone , ondansetron , oxyCODONE , rosuvastatin , and sitaGLIPtin   History Review  Allergy: Carolyn Roy is allergic to aspirin . Drug: Carolyn Roy  reports no history of drug use. Alcohol:  reports no history of alcohol use. Tobacco:  reports that she has been smoking cigarettes. She has a 11 pack-year smoking history. She has quit using smokeless tobacco.  Her smokeless tobacco use included snuff. Social: Carolyn Roy  reports that she has been smoking cigarettes. She has a 11 pack-year smoking history. She has quit using smokeless tobacco.  Her smokeless tobacco use included snuff. She reports that she does not drink alcohol and does not use drugs. Medical:  has a past medical history of Allergy, Breast cancer (HCC), Cancer (HCC) (06/15/2017), Depression, Diabetes mellitus without complication (HCC) (2017), Edema of left upper extremity, Endometriosis, Family history of breast cancer, Headache, Hyperlipidemia, Lymphedema of left arm, Ovarian mass, Personal history of chemotherapy, Personal history of radiation therapy, and Pneumonia. Surgical: Carolyn Roy  has a past surgical history that includes Oophorectomy; Tubal ligation; Portacath placement (Right, 07/14/2017); Axillary lymph node biopsy (Left, 07/14/2017); Breast lumpectomy (Left, 01/12/2018); Breast biopsy (Left); Partial mastectomy with needle localization (Left, 01/12/2018); Sentinel node biopsy (Left, 01/12/2018); Colonoscopy with propofol  (N/A, 12/27/2019); and  Colonoscopy with propofol  (N/A, 01/05/2022). Family: family history includes Breast cancer in an other family member; Breast cancer (age of onset: 4) in her maternal grandmother; Breast cancer (age of onset: 61) in an other family member; Breast cancer (age of onset: 89) in her maternal aunt; Cancer in her mother; Colon cancer in her maternal grandmother and mother; Diabetes in her brother; Other in her father; Pancreatitis in her brother; Prostate cancer (age of onset: 104) in her brother.  Laboratory Chemistry Profile   Renal Lab Results  Component Value Date   BUN 10 01/03/2023   CREATININE 0.81 01/03/2023   BCR 12 01/03/2023   GFRAA >60 10/21/2019   GFRNONAA >60 11/29/2022    Hepatic Lab Results  Component Value Date   AST 16 01/03/2023   ALT 14 01/03/2023   ALBUMIN 3.8 01/03/2023   ALKPHOS 78 01/03/2023   LIPASE 31 12/01/2020    Electrolytes Lab Results  Component Value Date   NA 144 01/03/2023   K 3.6 01/03/2023   CL 105 01/03/2023   CALCIUM  9.3 01/03/2023   MG 2.3 03/07/2022    Bone Lab Results  Component Value Date  VD25OH 99.50 11/29/2022    Inflammation (CRP: Acute Phase) (ESR: Chronic Phase) Lab Results  Component Value Date   CRP <1 12/21/2020   ESRSEDRATE 3 12/21/2020         Note: Above Lab results reviewed.  Recent Imaging Review  MM 3D SCREENING MAMMOGRAM BILATERAL BREAST CLINICAL DATA:  Screening.  EXAM: DIGITAL SCREENING BILATERAL MAMMOGRAM WITH TOMOSYNTHESIS AND CAD  TECHNIQUE: Bilateral screening digital craniocaudal and mediolateral oblique mammograms were obtained. Bilateral screening digital breast tomosynthesis was performed. The images were evaluated with computer-aided detection.  COMPARISON:  Previous exam(s).  ACR Breast Density Category b: There are scattered areas of fibroglandular density.  FINDINGS: There are no findings suspicious for malignancy.  IMPRESSION: No mammographic evidence of malignancy. A result letter of  this screening mammogram will be mailed directly to the patient.  RECOMMENDATION: Screening mammogram in one year. (Code:SM-B-01Y)  BI-RADS CATEGORY  1: Negative.  Electronically Signed   By: Serena  Chacko M.D.   On: 09/19/2022 11:47 Note: Reviewed        Physical Exam  General appearance: Well nourished, well developed, and well hydrated. In no apparent acute distress Mental status: Alert, oriented x 3 (person, place, & time)       Respiratory: No evidence of acute respiratory distress Eyes: PERLA Vitals: BP 133/82   Pulse 93   Temp (!) 97.2 F (36.2 C)   Resp 16   Ht 5\' 4"  (1.626 m)   Wt 198 lb (89.8 kg)   LMP  (LMP Unknown) Comment: LAST PERIOD IN MAY 2019 WHEN SHE STARTED CHEMO  SpO2 99%   BMI 33.99 kg/m  BMI: Estimated body mass index is 33.99 kg/m as calculated from the following:   Height as of this encounter: 5\' 4"  (1.626 m).   Weight as of this encounter: 198 lb (89.8 kg). Ideal: Ideal body weight: 54.7 kg (120 lb 9.5 oz) Adjusted ideal body weight: 68.7 kg (151 lb 8.9 oz)  Assessment   Diagnosis Status  1. Chronic feet pain (1ry area of Pain) (Bilateral) (R>L)   2. Chronic knee pain (2ry area of Pain) (Bilateral) (R>L)   3. Chronic hand pain (3ry area of Pain) (Bilateral) (R>L)   4. Cervicalgia   5. Cancer-related pain   6. Lumbar facet syndrome (Bilateral) (L>R)   7. Chronic low back pain (Bilateral (L>R) w/o sciatica   8. 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 Chronic hip pain (4th area of Pain) (Bilateral) (R>L)   10. 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 Abnormal MRI, lumbar spine (04/20/2018)   12. Chronic pain syndrome   13. Pharmacologic therapy   14. Chronic use of opiate for therapeutic purpose   15. Encounter for medication management   16. Encounter for chronic pain management    Controlled Controlled Controlled   Updated Problems: No problems updated.  Plan of  Care  Problem-specific:  Assessment and Plan    Knee Pain Intermittent pain in the right knee is exacerbated by bending or kneeling, with sensations described as tearing and burning in the anterior and posterior areas. Symptoms may worsen in cold weather. Weight management is advised to reduce stress on the knees. Avoid activities that exacerbate the pain.  Chronic Pain Management Compliant with the pain management regimen, including urine drug screening and prescription monitoring. No adverse reactions or side effects reported. Pills were counted, and the same pharmacy is used consistently. Continue the  current pain management regimen and schedule a follow-up in three months.  General Health Maintenance Maintaining a healthy weight is important to prevent additional stress on the hips and knees.       Carolyn Roy has a current medication list which includes the following long-term medication(s): bupropion , duloxetine , famotidine , gabapentin , metformin , [START ON 02/24/2023] oxycodone , [START ON 03/26/2023] oxycodone , [START ON 04/25/2023] oxycodone , rosuvastatin , sitagliptin , and sumatriptan .  Pharmacotherapy (Medications Ordered): Meds ordered this encounter  Medications   oxyCODONE  (OXY IR/ROXICODONE ) 5 MG immediate release tablet    Sig: Take 1 tablet (5 mg total) by mouth 2 (two) times daily as needed for severe pain (pain score 7-10). Must last 30 days    Dispense:  60 tablet    Refill:  0    DO NOT: delete (not duplicate); no partial-fill (will deny script to complete), no refill request (F/U required). DISPENSE: 1 day early if closed on fill date. WARN: No CNS-depressants within 8 hrs of med.   oxyCODONE  (OXY IR/ROXICODONE ) 5 MG immediate release tablet    Sig: Take 1 tablet (5 mg total) by mouth 2 (two) times daily as needed for severe pain (pain score 7-10). Must last 30 days    Dispense:  60 tablet    Refill:  0    DO NOT: delete (not duplicate); no partial-fill (will  deny script to complete), no refill request (F/U required). DISPENSE: 1 day early if closed on fill date. WARN: No CNS-depressants within 8 hrs of med.   oxyCODONE  (OXY IR/ROXICODONE ) 5 MG immediate release tablet    Sig: Take 1 tablet (5 mg total) by mouth 2 (two) times daily as needed for severe pain (pain score 7-10). Must last 30 days    Dispense:  60 tablet    Refill:  0    DO NOT: delete (not duplicate); no partial-fill (will deny script to complete), no refill request (F/U required). DISPENSE: 1 day early if closed on fill date. WARN: No CNS-depressants within 8 hrs of med.   Orders:  No orders of the defined types were placed in this encounter.  Follow-up plan:   Return in about 3 months (around 05/25/2023) for Eval-day (M,W), (F2F), (MM).      Interventional Therapies  Risk  Complexity Considerations:   Estimated body mass index is 30.55 kg/m as calculated from the following:   Height as of this encounter: 5\' 4"  (1.626 m).   Weight as of this encounter: 178 lb (80.7 kg). WNL   Planned  Pending:      Under consideration:   Possible bilateral lumbar facet RFA  Diagnostic lumbar sympathetic Blk  Diagnostic right genicular NB  Possible right genicular nerve RFA    Completed:   Therapeutic right IA steroid knee inj. x1 (08/18/2020) (100/100/100 x 24 hours/80-85)  Diagnostic bilateral lumbar facet Blk x2 (08/16/18) (100/100/80/90-100)  Therapeutic/palliative right knee Hyalgan inj. x5 (06/25/2019) (100/100/75 x 1 week/70)    Therapeutic  Palliative (PRN) options:   Therapeutic right IA steroid knee inj.  Diagnostic bilateral lumbar facet MBB   Therapeutic/palliative right Hyalgan knee inj.     Pharmacotherapy  Nonopioids transfer 02/03/2020: Pregabalin  (Lyrica ) and alpha-lipoic acid       Recent Visits No visits were found meeting these conditions. Showing recent visits within past 90 days and meeting all other requirements Today's Visits Date Type Provider Dept   02/22/23 Office Visit Renaldo Caroli, MD [facility]  Showing today's visits and meeting all other requirements Future Appointments  No visits were found meeting these conditions. Showing future appointments within next 90 days and meeting all other requirements  I discussed the assessment and treatment plan with the patient. The patient was provided an opportunity to ask questions and all were answered. The patient agreed with the plan and demonstrated an understanding of the instructions.  Patient advised to call back or seek an in-person evaluation if the symptoms or condition worsens.  Duration of encounter: 30 minutes.  Total time on encounter, as per AMA guidelines included both the face-to-face and non-face-to-face time personally spent by the physician and/or other qualified health care professional(s) on the day of the encounter (includes time in activities that require the physician or other qualified health care professional and does not include time in activities normally performed by clinical staff). Physician's time may include the following activities when performed: Preparing to see the patient (e.g., pre-charting review of records, searching for previously ordered imaging, lab work, and nerve conduction tests) Review of prior analgesic pharmacotherapies. Reviewing PMP Interpreting ordered tests (e.g., lab work, imaging, nerve conduction tests) Performing post-procedure evaluations, including interpretation of diagnostic procedures Obtaining and/or reviewing separately obtained history Performing a medically appropriate examination and/or evaluation Counseling and educating the patient/family/caregiver Ordering medications, tests, or procedures Referring and communicating with other health care professionals (when not separately reported) Documenting clinical information in the electronic or other health record Independently interpreting results (not separately  reported) and communicating results to the patient/ family/caregiver Care coordination (not separately reported)  Note by: Candi Chafe, MD Date: 02/22/2023; Time: 12:06 PM

## 2023-02-21 NOTE — Patient Instructions (Signed)

## 2023-02-22 ENCOUNTER — Other Ambulatory Visit
Admission: RE | Admit: 2023-02-22 | Discharge: 2023-02-22 | Disposition: A | Discharge status: Home / Self Care | Payer: Self-pay | Source: Ambulatory Visit | Attending: Medical Genetics | Admitting: Medical Genetics

## 2023-02-22 ENCOUNTER — Ambulatory Visit (HOSPITAL_BASED_OUTPATIENT_CLINIC_OR_DEPARTMENT_OTHER): Payer: Commercial Managed Care - PPO | Admitting: Pain Medicine

## 2023-02-22 ENCOUNTER — Telehealth: Payer: Self-pay | Admitting: Internal Medicine

## 2023-02-22 DIAGNOSIS — M79672 Pain in left foot: Secondary | ICD-10-CM | POA: Insufficient documentation

## 2023-02-22 DIAGNOSIS — M545 Low back pain, unspecified: Secondary | ICD-10-CM

## 2023-02-22 DIAGNOSIS — G893 Neoplasm related pain (acute) (chronic): Secondary | ICD-10-CM

## 2023-02-22 DIAGNOSIS — M47816 Spondylosis without myelopathy or radiculopathy, lumbar region: Secondary | ICD-10-CM

## 2023-02-22 DIAGNOSIS — G8929 Other chronic pain: Secondary | ICD-10-CM

## 2023-02-22 DIAGNOSIS — M79642 Pain in left hand: Secondary | ICD-10-CM

## 2023-02-22 DIAGNOSIS — M79671 Pain in right foot: Secondary | ICD-10-CM

## 2023-02-22 DIAGNOSIS — M25561 Pain in right knee: Secondary | ICD-10-CM

## 2023-02-22 DIAGNOSIS — G894 Chronic pain syndrome: Secondary | ICD-10-CM

## 2023-02-22 DIAGNOSIS — M2540 Effusion, unspecified joint: Secondary | ICD-10-CM

## 2023-02-22 DIAGNOSIS — M542 Cervicalgia: Secondary | ICD-10-CM

## 2023-02-22 DIAGNOSIS — M25552 Pain in left hip: Secondary | ICD-10-CM

## 2023-02-22 DIAGNOSIS — R937 Abnormal findings on diagnostic imaging of other parts of musculoskeletal system: Secondary | ICD-10-CM

## 2023-02-22 DIAGNOSIS — M25551 Pain in right hip: Secondary | ICD-10-CM

## 2023-02-22 DIAGNOSIS — M47819 Spondylosis without myelopathy or radiculopathy, site unspecified: Secondary | ICD-10-CM

## 2023-02-22 DIAGNOSIS — M79641 Pain in right hand: Secondary | ICD-10-CM

## 2023-02-22 DIAGNOSIS — Z79899 Other long term (current) drug therapy: Secondary | ICD-10-CM

## 2023-02-22 DIAGNOSIS — M25562 Pain in left knee: Secondary | ICD-10-CM

## 2023-02-22 DIAGNOSIS — Z79891 Long term (current) use of opiate analgesic: Secondary | ICD-10-CM

## 2023-02-22 DIAGNOSIS — M4316 Spondylolisthesis, lumbar region: Secondary | ICD-10-CM

## 2023-02-22 MED ORDER — OXYCODONE HCL 5 MG PO TABS: 5.0000 mg | ORAL_TABLET | Freq: Two times a day (BID) | ORAL | 0 refills | Status: DC | PRN

## 2023-02-22 NOTE — Telephone Encounter (Signed)
 Called patient to check on BD not scheduled- left voicemail for her to call back if she needs help getting scheduled

## 2023-02-22 NOTE — Progress Notes (Signed)
 Nursing Pain Medication Assessment:  Safety precautions to be maintained throughout the outpatient stay will include: orient to surroundings, keep bed in low position, maintain call bell within reach at all times, provide assistance with transfer out of bed and ambulation.  Medication Inspection Compliance: Pill count conducted under aseptic conditions, in front of the patient. Neither the pills nor the bottle was removed from the patient's sight at any time. Once count was completed pills were immediately returned to the patient in their original bottle.  Medication: Oxycodone  IR Pill/Patch Count:  55 of 60 pills remain Pill/Patch Appearance: Markings consistent with prescribed medication Bottle Appearance: Standard pharmacy container. Clearly labeled. Filled Date: 1 / 40 / 2025 Last Medication intake:  Today

## 2023-03-01 ENCOUNTER — Inpatient Hospital Stay: Payer: Commercial Managed Care - PPO

## 2023-03-01 ENCOUNTER — Inpatient Hospital Stay: Payer: Commercial Managed Care - PPO | Attending: Internal Medicine

## 2023-03-01 ENCOUNTER — Encounter: Payer: Self-pay | Admitting: Internal Medicine

## 2023-03-01 ENCOUNTER — Inpatient Hospital Stay (HOSPITAL_BASED_OUTPATIENT_CLINIC_OR_DEPARTMENT_OTHER): Payer: Commercial Managed Care - PPO | Admitting: Internal Medicine

## 2023-03-01 VITALS — BP 130/82 | HR 92 | Temp 98.1°F | Resp 16 | Wt 200.0 lb

## 2023-03-01 DIAGNOSIS — Z95828 Presence of other vascular implants and grafts: Secondary | ICD-10-CM

## 2023-03-01 DIAGNOSIS — Z79811 Long term (current) use of aromatase inhibitors: Secondary | ICD-10-CM | POA: Diagnosis not present

## 2023-03-01 DIAGNOSIS — C50512 Malignant neoplasm of lower-outer quadrant of left female breast: Secondary | ICD-10-CM | POA: Insufficient documentation

## 2023-03-01 DIAGNOSIS — Z170 Estrogen receptor positive status [ER+]: Secondary | ICD-10-CM

## 2023-03-01 DIAGNOSIS — Z1732 Human epidermal growth factor receptor 2 negative status: Secondary | ICD-10-CM | POA: Diagnosis not present

## 2023-03-01 DIAGNOSIS — Z79899 Other long term (current) drug therapy: Secondary | ICD-10-CM | POA: Insufficient documentation

## 2023-03-01 DIAGNOSIS — Z1721 Progesterone receptor positive status: Secondary | ICD-10-CM | POA: Diagnosis not present

## 2023-03-01 LAB — CMP (CANCER CENTER ONLY)
ALT: 15 U/L (ref 0–44)
AST: 18 U/L (ref 15–41)
Albumin: 3.4 g/dL — ABNORMAL LOW (ref 3.5–5.0)
Alkaline Phosphatase: 62 U/L (ref 38–126)
Anion gap: 10 (ref 5–15)
BUN: 14 mg/dL (ref 6–20)
CO2: 30 mmol/L (ref 22–32)
Calcium: 9.8 mg/dL (ref 8.9–10.3)
Chloride: 103 mmol/L (ref 98–111)
Creatinine: 0.67 mg/dL (ref 0.44–1.00)
GFR, Estimated: >60 mL/min (ref >60)
Glucose, Bld: 181 mg/dL — ABNORMAL HIGH (ref 70–99)
Potassium: 3.4 mmol/L — ABNORMAL LOW (ref 3.5–5.1)
Sodium: 143 mmol/L (ref 135–145)
Total Bilirubin: 0.5 mg/dL (ref 0.0–1.2)
Total Protein: 6.2 g/dL — ABNORMAL LOW (ref 6.5–8.1)

## 2023-03-01 LAB — FERRITIN: Ferritin: 109 ng/mL (ref 11–307)

## 2023-03-01 LAB — CBC WITH DIFFERENTIAL (CANCER CENTER ONLY)
Abs Immature Granulocytes: 0.03 10*3/uL (ref 0.00–0.07)
Basophils Absolute: 0 10*3/uL (ref 0.0–0.1)
Basophils Relative: 1 %
Eosinophils Absolute: 0.1 10*3/uL (ref 0.0–0.5)
Eosinophils Relative: 1 %
HCT: 33.7 % — ABNORMAL LOW (ref 36.0–46.0)
Hemoglobin: 11 g/dL — ABNORMAL LOW (ref 12.0–15.0)
Immature Granulocytes: 1 %
Lymphocytes Relative: 27 %
Lymphs Abs: 1.8 10*3/uL (ref 0.7–4.0)
MCH: 31.4 pg (ref 26.0–34.0)
MCHC: 32.6 g/dL (ref 30.0–36.0)
MCV: 96.3 fL (ref 80.0–100.0)
Monocytes Absolute: 0.4 10*3/uL (ref 0.1–1.0)
Monocytes Relative: 6 %
Neutro Abs: 4.3 10*3/uL (ref 1.7–7.7)
Neutrophils Relative %: 64 %
Platelet Count: 188 10*3/uL (ref 150–400)
RBC: 3.5 MIL/uL — ABNORMAL LOW (ref 3.87–5.11)
RDW: 13.6 % (ref 11.5–15.5)
WBC Count: 6.5 10*3/uL (ref 4.0–10.5)
nRBC: 0 % (ref 0.0–0.2)

## 2023-03-01 LAB — IRON AND TIBC
Iron: 60 ug/dL (ref 28–170)
Saturation Ratios: 20 % (ref 10.4–31.8)
TIBC: 294 ug/dL (ref 250–450)
UIBC: 234 ug/dL

## 2023-03-01 MED ORDER — HEPARIN SOD (PORK) LOCK FLUSH 100 UNIT/ML IV SOLN
500.0000 [IU] | Freq: Once | INTRAVENOUS | Status: AC
  Administered 2023-03-01: 500 [IU] via INTRAVENOUS
  Filled 2023-03-01: qty 5

## 2023-03-01 MED ORDER — ZOLEDRONIC ACID 4 MG/100ML IV SOLN
4.0000 mg | Freq: Once | INTRAVENOUS | Status: AC
Start: 2023-03-01 — End: 2023-03-01
  Administered 2023-03-01: 4 mg via INTRAVENOUS
  Filled 2023-03-01: qty 100

## 2023-03-01 MED ORDER — SODIUM CHLORIDE 0.9 % IV SOLN
INTRAVENOUS | Status: DC
  Filled 2023-03-01: qty 250

## 2023-03-01 MED ORDER — ZOLEDRONIC ACID 4 MG/5ML IV CONC
4.0000 mg | Freq: Once | INTRAVENOUS | Status: DC
Start: 2023-03-01 — End: 2023-03-01

## 2023-03-01 MED ORDER — SODIUM CHLORIDE 0.9% FLUSH
10.0000 mL | Freq: Once | INTRAVENOUS | Status: AC
  Administered 2023-03-01: 10 mL via INTRAVENOUS
  Filled 2023-03-01: qty 10

## 2023-03-01 MED ORDER — LETROZOLE 2.5 MG PO TABS: 2.5000 mg | ORAL_TABLET | Freq: Every day | ORAL | 1 refills | Status: DC

## 2023-03-01 NOTE — Patient Instructions (Signed)

## 2023-03-01 NOTE — Progress Notes (Signed)
[facility] CONSULT NOTE  Patient Care Team: Marjie Skiff, NP as PCP - General (Nurse Practitioner) Meriam Sprague, MD (Inactive) as PCP - Cardiology (Cardiology) [name], Merrily Pew, MD (General Surgery) Benita Gutter, RN as Registered Nurse Sonia Side, Philbert Riser, [name] (Obstetrics and Gynecology) Earna Coder, MD as Consulting Physician (Oncology) Lyndle Herrlich, MD as Consulting Physician (Orthopedic Surgery)  CHIEF COMPLAINTS/PURPOSE OF CONSULTATION: Breast cancer  Oncology History Overview Note  # MAY 2019-  clinical stage IIIA (T3N1Mx) left breast cancer s/p biopsy on 06/14/2017. -Pathology revealed grade III invasive ductal carcinoma. -Axillary FNA revealed malignant cells c/w metastatic carcinoma. Tumor was ER + (90%), PR + (30%), Her2/neu - and Ki67 70%.  CA27.29 was 7.8 on 06/14/2017.  # She received 4 cycles of AC with Neulasta support (07/20/2017 - 08/31/2017).;  neoadjuvant Taxol on 09/14/2017.  #DEC 2019- Lumpectomy/sentinel lymph node biopsy [Dr.[name]]-complete pathologic response  # s/p RT [delayed sec to wound infection; Dr.[name]] finished RT [4/12]  # April 14th 2020- START TAM; stopped in mid-May secondary intolerance [severe migraines].  # 18th May 2020-start Arimidex [hormonal profile-postmenopausal;add Zoladex q3M]; STOPPED sec intolerance/joint pain; NOV 2021- STARTED AROMASIN; Stopped x 2 months sec to extreme fatigue/severe joint pains.   # July 2022- START tamoxifen 10 mg a day; Stopped Tamoxifen June 2024.   # JULY 23rd, 2024- START Letrozole   # PN-2 sec to taxol Ottis Stain management/ # may 2019- Endometrial sampling [Dr. Secord/Berchuck]-negative for malignancy/ # DM-2- poorly controlled.   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   # PAP SMEAR- RECOMMENDED 2022- summer  -------------------------------------------  DIAGNOSIS: left breast  cancer  STAGE:  III       ;GOALS: cure      Cancer of midline of breast, left (HCC) (Resolved)  06/15/2017 Initial Diagnosis   Cancer of midline of breast, left (HCC)   07/20/2017 - 11/16/2017 Chemotherapy   The patient had dexamethasone (DECADRON) 4 MG tablet, 1 of 1 cycle, Start date: --, End date: -- DOXOrubicin (ADRIAMYCIN) chemo injection 122 mg, 60 mg/m2 = 122 mg, Intravenous,  Once, 4 of 4 cycles Administration: 122 mg (07/20/2017), 122 mg (08/03/2017), 122 mg (08/17/2017), 122 mg (08/31/2017) palonosetron (ALOXI) injection 0.25 mg, 0.25 mg, Intravenous,  Once, 4 of 4 cycles Administration: 0.25 mg (07/20/2017), 0.25 mg (08/03/2017), 0.25 mg (08/17/2017), 0.25 mg (08/31/2017) pegfilgrastim (NEULASTA) injection 6 mg, 6 mg, Subcutaneous, Once, 5 of 5 cycles Administration: 6 mg (07/21/2017), 6 mg (08/04/2017), 6 mg (08/18/2017), 6 mg (09/01/2017) cyclophosphamide (CYTOXAN) 1,220 mg in sodium chloride 0.9 % 250 mL chemo infusion, 600 mg/m2 = 1,220 mg, Intravenous,  Once, 4 of 4 cycles Administration: 1,220 mg (07/20/2017), 1,220 mg (08/03/2017), 1,220 mg (08/17/2017), 1,220 mg (08/31/2017) PACLitaxel (TAXOL) 162 mg in sodium chloride 0.9 % 250 mL chemo infusion (</= 80mg /m2), 80 mg/m2 = 162 mg, Intravenous,  Once, 10 of 12 cycles Dose modification: 65 mg/m2 (original dose 80 mg/m2, Cycle 13, Reason: Provider Judgment, Comment: neuropathy) Administration: 162 mg (09/14/2017), 162 mg (09/21/2017), 162 mg (09/28/2017), 162 mg (10/05/2017), 162 mg (10/12/2017), 162 mg (10/19/2017), 162 mg (10/26/2017), 132 mg (11/02/2017), 132 mg (11/09/2017), 132 mg (11/16/2017) fosaprepitant (EMEND) 150 mg, dexamethasone (DECADRON) 12 mg in sodium chloride 0.9 % 145 mL IVPB, , Intravenous,  Once, 4 of 4 cycles Administration:  (07/20/2017),  (08/03/2017),  (08/17/2017),  (08/31/2017)  for chemotherapy treatment.    Malignant neoplasm of lower-outer quadrant of left  breast of female, estrogen receptor positive (HCC)  11/08/2017 Initial  Diagnosis   Malignant neoplasm of lower-outer quadrant of left breast of female, estrogen receptor positive (HCC)    HISTORY OF PRESENTING ILLNESS: Ambulating independently.  Alone.  Carolyn Roy 58 y.o.  female with a history of stage III ER PR positive HER-2/neu negative breast cancer currently on Letrozole [intolerance] plus Zoladexq3 /Zometa every 6 months is here for follow-up.   Patient would like to know if she could get a prescription for letrozole, she hasn't been able to get it filled due to needing pcp to authorize  Complains of hot flashes; and also joint pains. Patient denies any new lumps or bumps.  Appetite is good.  No weight loss. Patient has been started on amitriptyline; by neurology.  She continues to be on Cymbalta.    Review of Systems  Constitutional:  Positive for malaise/fatigue. Negative for chills, diaphoresis, fever and weight loss.  HENT:  Negative for nosebleeds and sore throat.   Eyes:  Negative for double vision.  Respiratory:  Negative for cough, hemoptysis, sputum production, shortness of breath and wheezing.   Cardiovascular:  Negative for chest pain, palpitations, orthopnea and leg swelling.  Gastrointestinal:  Negative for abdominal pain, blood in stool, constipation, diarrhea, heartburn, melena, nausea and vomiting.  Genitourinary:  Negative for dysuria, frequency and urgency.  Musculoskeletal:  Positive for back pain and joint pain.  Skin: Negative.  Negative for itching and rash.  Neurological:  Positive for tingling. Negative for dizziness, focal weakness, weakness and headaches.  Endo/Heme/Allergies:  Does not bruise/bleed easily.  Psychiatric/Behavioral:  Negative for depression. The patient is not nervous/anxious and does not have insomnia.      MEDICAL HISTORY:  Past Medical History:  Diagnosis Date   Allergy    Breast cancer (HCC)    Cancer (HCC) 06/15/2017   5.1 cm, T3,N1 (clinical): ER/ PR positive, Her 2 neu not overexpressed,  High Ki 67. Neuoadjuvant chemotherapy.    Depression    Diabetes mellitus without complication (HCC) 2017   Edema of left upper extremity    Endometriosis    Family history of breast cancer    Headache    migraines   Hyperlipidemia    Lymphedema of left arm    Ovarian mass    Personal history of chemotherapy    Personal history of radiation therapy    Pneumonia    2018    SURGICAL HISTORY: Past Surgical History:  Procedure Laterality Date   AXILLARY LYMPH NODE BIOPSY Left 07/14/2017   Procedure: INSERTION GEL MARK CLIP LEFT AXILLA;  Surgeon: Earline Mayotte, MD;  Location: [facility] ORS;  Service: General;  Laterality: Left;   BREAST BIOPSY Left    Dr Victorio Palm BREAST METASTATIC CARCINOMA   BREAST LUMPECTOMY Left 01/12/2018   COLONOSCOPY WITH PROPOFOL N/A 12/27/2019   Procedure: COLONOSCOPY WITH PROPOFOL;  Surgeon: Pasty Spillers, MD;  Location: [facility] ENDOSCOPY;  Service: Endoscopy;  Laterality: N/A;   COLONOSCOPY WITH PROPOFOL N/A 01/05/2022   Procedure: COLONOSCOPY WITH PROPOFOL;  Surgeon: Toney Reil, MD;  Location[facility] ENDOSCOPY;  Service: Gastroenterology;  Laterality: N/A;   OOPHORECTOMY     PARTIAL MASTECTOMY WITH NEEDLE LOCALIZATION Left 01/12/2018   Procedure: PARTIAL MASTECTOMY WITH NEEDLE LOCALIZATION;  Surgeon: Earline Mayotte, MD;  Location: [facility] ORS;  Service: General;  Laterality: Left;   PORTACATH PLACEMENT Right 07/14/2017   Procedure: INSERTION PORT-A-CATH;  Surgeon: Earline Mayotte, MD;  Location: [facility] ORS;  Service: General;  Laterality: Right;   SENTINEL NODE BIOPSY Left 01/12/2018   Procedure: SENTINEL NODE BIOPSY;  Surgeon: Earline Mayotte, MD;  Location: [facility] ORS;  Service: General;  Laterality: Left;   TUBAL LIGATION      SOCIAL HISTORY: Social History   Socioeconomic History   Marital status: Married    Spouse name: Not on file   Number of children: Not on file   Years of education: Not on file   Highest education level: GED  or equivalent  Occupational History   Not on file  Tobacco Use   Smoking status: Every Day    Current packs/day: 1.00    Average packs/day: 1 pack/day for 11.0 years (11.0 ttl pk-yrs)    Types: Cigarettes   Smokeless tobacco: Former    Types: Snuff  Vaping Use   Vaping status: Never Used  Substance and Sexual Activity   Alcohol use: No    Alcohol/week: 0.0 standard drinks of alcohol   Drug use: No   Sexual activity: Yes  Other Topics Concern   Not on file  Social History Narrative   ** Merged History Encounter **       Social Drivers of Health   Financial Resource Strain: Low Risk  (06/06/2022)   Overall Financial Resource Strain (CARDIA)    Difficulty of Paying Living Expenses: Not hard at all  Food Insecurity: Food Insecurity Present (06/06/2022)   Hunger Vital Sign    Worried About Running Out of Food in the Last Year: Sometimes true    Ran Out of Food in the Last Year: Sometimes true  Transportation Needs: No Transportation Needs (06/06/2022)   PRAPARE - Administrator, Civil Service (Medical): No    Lack of Transportation (Non-Medical): No  Physical Activity: Sufficiently Active (06/06/2022)   Exercise Vital Sign    Days of Exercise per Week: 5 days    Minutes of Exercise per Session: 150+ min  Stress: No Stress Concern Present (06/06/2022)   [employer] of Occupational Health - Occupational Stress Questionnaire    Feeling of Stress : Only a little  Social Connections: Socially Isolated (06/06/2022)   Social Connection and Isolation Panel [NHANES]    Frequency of Communication with Friends and Family: Once a week    Frequency of Social Gatherings with Friends and Family: Never    Attends Religious Services: Never    Diplomatic Services operational officer: No    Attends Engineer, structural: Not on file    Marital Status: Married  Catering manager Violence: Not on file    FAMILY HISTORY: Family History  Problem Relation Age of Onset    Colon cancer Mother    Cancer Mother    Other Father        family hx on dad's side: breast, colon, stomach cancer-biological dad   Diabetes Brother    Pancreatitis Brother    Prostate cancer Brother 84       currently 38 / maternal half-brother   Breast cancer Maternal Aunt 47       currently 22   Breast cancer Maternal Grandmother 40       deceased 30s   Colon cancer Maternal Grandmother    Breast cancer Other 63       mother's sister; deceased 34   Breast cancer Other        mother's sister; age at dx unknown    ALLERGIES:  is allergic to aspirin.  MEDICATIONS:  Current Outpatient Medications  Medication Sig Dispense Refill   aspirin-acetaminophen-caffeine (EXCEDRIN MIGRAINE) 250-250-65 MG tablet Take 2 tablets by mouth daily as needed for headache.     Blood Glucose Monitoring Suppl (ONETOUCH VERIO) w/Device KIT Use to check blood sugar 3 times a day and document results, bring to appointments.  Goal is <130 fasting blood sugar and <180 two hours after meals. 1 kit 0   buPROPion (WELLBUTRIN XL) 150 MG 24 hr tablet TAKE 1 TABLET BY MOUTH EVERY DAY 90 tablet 4   DULoxetine (CYMBALTA) 60 MG capsule Take 1 capsule (60 mg total) by mouth daily. 90 capsule 4   famotidine (PEPCID) 20 MG tablet TAKE 1 TABLET BY MOUTH EVERY DAY 90 tablet 0   gabapentin (NEURONTIN) 300 MG capsule Take 1 capsule (300 mg total) by mouth 2 (two) times daily. 180 capsule 4   glucose blood test strip Use to check blood sugar 3 times daily, fasting in morning with goal <130 and 2 hours after meals with goal <180.  Bring blood sugar log to visits. 100 each 12   Insulin Glargine-aglr (REZVOGLAR KWIKPEN) 100 UNIT/ML SOPN Inject 50 Units into the skin at bedtime. 15 mL 4   Insulin Pen Needle (BD PEN NEEDLE NANO 2ND GEN) 32G X 4 MM MISC For use with insulin pens daily. 100 each 12   letrozole (FEMARA) 2.5 MG tablet Take 1 tablet (2.5 mg total) by mouth daily. 90 tablet 1   metFORMIN (GLUCOPHAGE-XR) 500 MG 24 hr  tablet START BY TAKING ONE TABLET (500 MG) TWICE A DAY WITH MEALS AND THEN INCREASE IN ONE WEEK TO TWO TABLETS (1000 MG) TWICE A DAY WITH MEALS. 360 tablet 1   naloxone (NARCAN) nasal spray 4 mg/0.1 mL Place 1 spray into the nose as needed for up to 365 doses (for opioid-induced respiratory depresssion). In case of emergency (overdose), spray once into each nostril. If no response within 3 minutes, repeat application and call 911. 1 each 0   ondansetron (ZOFRAN) 8 MG tablet Take 1 tablet (8 mg total) by mouth 2 (two) times daily as needed for nausea or vomiting. 60 tablet 4   oxyCODONE (OXY IR/ROXICODONE) 5 MG immediate release tablet Take 1 tablet (5 mg total) by mouth 2 (two) times daily as needed for severe pain (pain score 7-10). Must last 30 days 60 tablet 0   [START ON 03/26/2023] oxyCODONE (OXY IR/ROXICODONE) 5 MG immediate release tablet Take 1 tablet (5 mg total) by mouth 2 (two) times daily as needed for severe pain (pain score 7-10). Must last 30 days 60 tablet 0   [START ON 04/25/2023] oxyCODONE (OXY IR/ROXICODONE) 5 MG immediate release tablet Take 1 tablet (5 mg total) by mouth 2 (two) times daily as needed for severe pain (pain score 7-10). Must last 30 days 60 tablet 0   rosuvastatin (CRESTOR) 40 MG tablet Take 1 tablet (40 mg total) by mouth daily. 90 tablet 4   sitaGLIPtin (JANUVIA) 100 MG tablet Take 1 tablet (100 mg total) by mouth daily. 90 tablet 4   SUMAtriptan (IMITREX) 100 MG tablet TAKE 1 TABLET BY MOUTH AS NEEDED FOR MIGRAINE. MAY REPEAT IN 2 HOURS IF HEADACHE PERSISTS OR RECURS AS DIRECTED. 10 tablet 0   Vitamin D, Ergocalciferol, (DRISDOL) 1.25 MG (50000 UNIT) CAPS capsule TAKE 1 CAPSULE BY MOUTH ONE TIME PER WEEK 12 capsule 1   No current facility-administered medications for this visit.   Facility-Administered Medications Ordered in Other Visits  Medication Dose Route Frequency Provider Last Rate Last  Admin   0.9 %  sodium chloride infusion   Intravenous Continuous  Earna Coder, MD 10 mL/hr at 03/01/23 1450 New Bag at 03/01/23 1450   heparin lock flush 100 unit/mL  500 Units Intravenous Once Corcoran, Melissa C, MD       sodium chloride flush (NS) 0.9 % injection 10 mL  10 mL Intravenous Once Rosey Bath, MD       zoledronic acid (ZOMETA) 4 mg in sodium chloride 0.9 % 100 mL IVPB  4 mg Intravenous Once Earna Coder, MD          .  PHYSICAL EXAMINATION: ECOG PERFORMANCE STATUS: 0 - Asymptomatic  Vitals:   03/01/23 1422  BP: 130/82  Pulse: 92  Resp: 16  Temp: 98.1 F (36.7 C)  SpO2: 97%    Filed Weights   03/01/23 1422  Weight: 200 lb (90.7 kg)     Physical Exam HENT:     Head: Normocephalic and atraumatic.     Mouth/Throat:     Pharynx: No oropharyngeal exudate.  Eyes:     Pupils: Pupils are equal, round, and reactive to light.  Cardiovascular:     Rate and Rhythm: Normal rate and regular rhythm.  Pulmonary:     Effort: No respiratory distress.     Breath sounds: No wheezing.  Abdominal:     General: Bowel sounds are normal. There is no distension.     Palpations: Abdomen is soft. There is no mass.     Tenderness: There is no abdominal tenderness. There is no guarding or rebound.  Musculoskeletal:        General: No tenderness. Normal range of motion.     Cervical back: Normal range of motion and neck supple.  Skin:    General: Skin is warm.     Comments:     Neurological:     Mental Status: She is alert and oriented to person, place, and time.  Psychiatric:        Mood and Affect: Affect normal.      LABORATORY DATA:  I have reviewed the data as listed Lab Results  Component Value Date   WBC 6.5 03/01/2023   HGB 11.0 (L) 03/01/2023   HCT 33.7 (L) 03/01/2023   MCV 96.3 03/01/2023   PLT 188 03/01/2023   Recent Labs    08/30/22 1256 09/27/22 1328 11/29/22 1241 01/03/23 0846 03/01/23 1405  NA 138   < > 140 144 143  K 3.6   < > 3.5 3.6 3.4*  CL 106   < > 106 105 103  CO2 23   <  > 26 26 30   GLUCOSE 159*   < > 197* 208* 181*  BUN 15   < > 12 10 14   CREATININE 1.11*   < > 0.85 0.81 0.67  CALCIUM 9.1   < > 9.2 9.3 9.8  GFRNONAA 59*  --  >60  --  >60  PROT 6.8   < > 6.6 6.0 6.2*  ALBUMIN 3.5   < > 3.6 3.8 3.4*  AST 22   < > 22 16 18   ALT 17   < > 21 14 15   ALKPHOS 57   < > 65 78 62  BILITOT 0.4   < > 0.4 0.4 0.5   < > = values in this interval not displayed.    RADIOGRAPHIC STUDIES: I have personally reviewed the radiological images as listed and agreed with the findings in the report.  No results found.  ASSESSMENT & PLAN:   Malignant neoplasm of lower-outer quadrant of left breast of female, estrogen receptor positive (HCC) # May 2019- HIGH RISK- LEFT  Breast cancer Stage III ER/PR pos her 2 NEG on OFS+ Letrozole. Also on -Adjuvant Zometa q6M [June 2022 x3 years]; Zoladex today q3M. BIL Mammogram August 2024 [Dr.Piscoya]- WNL. Currently [facility] Let[facility]ole- [facility]  # No clinical evidence of recurrence-  stable; will refill letrozole [plan extended AI until- 2029].   # Mild Anemia- Hb 11-12-  Hx of colonoscopy- 2022 ? S/p colonoscopy [nov 2023;  Dr.Vanga. OCT 2023- 133; sat-19%.; continue linzess- stable; continue PO gentle iron.   # Depression/ Insomnia--cymblata/OFF Amiytrplitine qhs /Dr.vaslow[DI-none as per uptodate*] PCP-  stable   # Bone health- DEC 2021- BMD T score: 0.3. Joint pains-multifactorial- Improved off Lyrica [As per pt ]- improved off tamoxifen- monitor closley on Letrozole. Check vit D levels. Reminded of BMD.   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   # Lymphedema-left chest walls/p physical therapy- stable.   # DM-2 on insulin- poorly controlled  [A1c-6.8] Blood glucose - stable.   # Port malfunction: flush; s/p TPA-- # port/IV access- Stable  Zometa q 22m- last in July 2025; zola 3 m- stop 11/29/22  # DISPOSITION: # port flush in 3 month # Zometa today # Follow up in 6 month; MD;port/  cbc/cmp;iron;estradiol;LH; FSH- ferritin- ca-27-29; Zometa; BMD prior- Dr.B    All questions were answered. The patient knows to call the clinic with any problems, questions or concerns.    Earna Coder, MD 03/01/2023 3:04 PM

## 2023-03-01 NOTE — Progress Notes (Signed)
Patient would like to know if she could get a prescription for letrozole, she hasn't been able to get it filled due to needing pcp to authorize.

## 2023-03-01 NOTE — Assessment & Plan Note (Addendum)
#   May 2019- HIGH RISK- LEFT  Breast cancer Stage III ER/PR pos her 2 NEG on OFS+ Letrozole. Also on -Adjuvant Zometa q6M [June 2022 x3 years]; Zoladex today q3M. BIL Mammogram August 2024 [Dr.Piscoya]- WNL. Currently on Letrozole- in JULY 2024[facility]]  # No clinical evidence of recurrence-  stable; will refill letrozole [plan extended AI until- 2029].   # Mild Anemia- Hb 11-12-  Hx of colonoscopy- 2022 ? S/p colonoscopy [nov 2023;  Dr.Vanga. OCT 2023- 133; sat-19%.; continue linzess- stable; continue PO gentle iron.   # Depression/ Insomnia--cymblata/OFF Amiytrplitine qhs /Dr.vaslow[DI-none as per uptodate*] PCP-  stable   # Bone health- DEC 2021- BMD T score: 0.3. Joint pains-multifactorial- Improved off Lyrica [As per pt ]- improved off tamoxifen- monitor closley on Letrozole. Check vit D levels. Reminded of BMD.   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   # Lymphedema-left chest walls/p physical therapy- stable.   # DM-2 on insulin- poorly controlled  [A1c-6.8] Blood glucose - stable.   # Port malfunction: flush; s/p TPA-- # port/IV access- Stable  Zometa q 36m- last in July 2025; zola 3 m- stop 11/29/22  # DISPOSITION: # port flush in 3 month # Zometa today # Follow up in 6 month; MD;port/ cbc/cmp;iron;estradiol;LH; FSH- ferritin- ca-27-29; Zometa; BMD prior- Dr.B

## 2023-03-02 LAB — FSH/LH
FSH: 2.9 m[IU]/mL
LH: <0.3 m[IU]/mL

## 2023-03-02 LAB — CANCER ANTIGEN 27.29: CA 27.29: <9 U/mL (ref 0.0–38.6)

## 2023-03-02 LAB — ESTRADIOL: Estradiol: <5 pg/mL

## 2023-03-06 LAB — GENECONNECT MOLECULAR SCREEN: Genetic Analysis Overall Interpretation: NEGATIVE

## 2023-04-01 NOTE — Patient Instructions (Incomplete)
 Be Involved in Caring For Your Health:  Taking Medications When medications are taken as directed, they can greatly improve your health. But if they are not taken as prescribed, they may not work. In some cases, not taking them correctly can be harmful. To help ensure your treatment remains effective and safe, understand your medications and how to take them. Bring your medications to each visit for review by your provider.  Your lab results, notes, and after visit summary will be available on My Chart. We strongly encourage you to use this feature. If lab results are abnormal the clinic will contact you with the appropriate steps. If the clinic does not contact you assume the results are satisfactory. You can always view your results on My Chart. If you have questions regarding your health or results, please contact the clinic during office hours. You can also ask questions on My Chart.  We at [facility] are grateful that you chose [place] to provide your care. We strive to provide evidence-based and compassionate care and are always looking for feedback. If you get a survey from the clinic please complete this so we can hear your opinions.  Diabetes Mellitus and Foot Care Diabetes, also called diabetes mellitus, may cause problems with your feet and legs because of poor blood flow (circulation). Poor circulation may make your skin: Become thinner and drier. Break more easily. Heal more slowly. Peel and crack. You may also have nerve damage (neuropathy). This can cause decreased feeling in your legs and feet. This means that you may not notice minor injuries to your feet that could lead to more serious problems. Finding and treating problems early is the best way to prevent future foot problems. How to care for your feet Foot hygiene  Wash your feet daily with warm water and mild soap. Do not use hot water. Then, pat your feet and the areas between your toes until they are fully dry. Do  not soak your feet. This can dry your skin. Trim your toenails straight across. Do not dig under them or around the cuticle. File the edges of your nails with an emery board or nail file. Apply a moisturizing lotion or petroleum jelly to the skin on your feet and to dry, brittle toenails. Use lotion that does not contain alcohol and is unscented. Do not apply lotion between your toes. Shoes and socks Wear clean socks or stockings every day. Make sure they are not too tight. Do not wear knee-high stockings. These may decrease blood flow to your legs. Wear shoes that fit well and have enough cushioning. Always look in your shoes before you put them on to be sure there are no objects inside. To break in new shoes, wear them for just a few hours a day. This prevents injuries on your feet. Wounds, scrapes, corns, and calluses  Check your feet daily for blisters, cuts, bruises, sores, and redness. If you cannot see the bottom of your feet, use a mirror or ask someone for help. Do not cut off corns or calluses or try to remove them with medicine. If you find a minor scrape, cut, or break in the skin on your feet, keep it and the skin around it clean and dry. You may clean these areas with mild soap and water. Do not clean the area with peroxide, alcohol, or iodine. If you have a wound, scrape, corn, or callus on your foot, look at it several times a day to make sure it  is healing and not infected. Check for: Redness, swelling, or pain. Fluid or blood. Warmth. Pus or a bad smell. General tips Do not cross your legs. This may decrease blood flow to your feet. Do not use heating pads or hot water bottles on your feet. They may burn your skin. If you have lost feeling in your feet or legs, you may not know this is happening until it is too late. Protect your feet from hot and cold by wearing shoes, such as at the beach or on hot pavement. Schedule a complete foot exam at least once a year or more often if  you have foot problems. Report any cuts, sores, or bruises to your health care provider right away. Where to find more information American Diabetes Association: diabetes.org Association of Diabetes Care & Education Specialists: diabeteseducator.org Contact a health care provider if: You have a condition that increases your risk of infection, and you have any cuts, sores, or bruises on your feet. You have an injury that is not healing. You have redness on your legs or feet. You feel burning or tingling in your legs or feet. You have pain or cramps in your legs and feet. Your legs or feet are numb. Your feet always feel cold. You have pain around any toenails. Get help right away if: You have a wound, scrape, corn, or callus on your foot and: You have signs of infection. You have a fever. You have a red line going up your leg. This information is not intended to replace advice given to you by your health care provider. Make sure you discuss any questions you have with your health care provider. Document Revised: 07/28/2021 Document Reviewed: 07/28/2021 Elsevier Patient Education  2024 [employer].

## 2023-04-03 ENCOUNTER — Ambulatory Visit: Payer: Commercial Managed Care - PPO | Admitting: Nurse Practitioner

## 2023-04-03 ENCOUNTER — Encounter: Payer: Self-pay | Admitting: Nurse Practitioner

## 2023-04-03 VITALS — BP 130/77 | HR 71 | Temp 98.0°F | Ht 64.0 in | Wt 204.8 lb

## 2023-04-03 DIAGNOSIS — E1169 Type 2 diabetes mellitus with other specified complication: Secondary | ICD-10-CM

## 2023-04-03 DIAGNOSIS — E1165 Type 2 diabetes mellitus with hyperglycemia: Secondary | ICD-10-CM | POA: Diagnosis not present

## 2023-04-03 DIAGNOSIS — G43009 Migraine without aura, not intractable, without status migrainosus: Secondary | ICD-10-CM

## 2023-04-03 DIAGNOSIS — C50512 Malignant neoplasm of lower-outer quadrant of left female breast: Secondary | ICD-10-CM

## 2023-04-03 DIAGNOSIS — G620 Drug-induced polyneuropathy: Secondary | ICD-10-CM

## 2023-04-03 DIAGNOSIS — G894 Chronic pain syndrome: Secondary | ICD-10-CM

## 2023-04-03 DIAGNOSIS — F322 Major depressive disorder, single episode, severe without psychotic features: Secondary | ICD-10-CM | POA: Diagnosis not present

## 2023-04-03 DIAGNOSIS — F1120 Opioid dependence, uncomplicated: Secondary | ICD-10-CM

## 2023-04-03 DIAGNOSIS — F17219 Nicotine dependence, cigarettes, with unspecified nicotine-induced disorders: Secondary | ICD-10-CM

## 2023-04-03 DIAGNOSIS — F19982 Other psychoactive substance use, unspecified with psychoactive substance-induced sleep disorder: Secondary | ICD-10-CM

## 2023-04-03 DIAGNOSIS — I951 Orthostatic hypotension: Secondary | ICD-10-CM

## 2023-04-03 DIAGNOSIS — E66812 Obesity, class 2: Secondary | ICD-10-CM

## 2023-04-03 DIAGNOSIS — Z794 Long term (current) use of insulin: Secondary | ICD-10-CM

## 2023-04-03 LAB — MICROALBUMIN, URINE WAIVED
Creatinine, Urine Waived: 300 mg/dL (ref 10–300)
Microalb, Ur Waived: 150 mg/L — ABNORMAL HIGH (ref 0–19)

## 2023-04-03 LAB — BAYER DCA HB A1C WAIVED: HB A1C (BAYER DCA - WAIVED): 7.6 % — ABNORMAL HIGH (ref 4.8–5.6)

## 2023-04-03 MED ORDER — BUPROPION HCL ER (XL) 300 MG PO TB24: 300.0000 mg | ORAL_TABLET | Freq: Every day | ORAL | 4 refills | Status: AC

## 2023-04-03 MED ORDER — SUMATRIPTAN SUCCINATE 100 MG PO TABS: 100.0000 mg | ORAL_TABLET | ORAL | 4 refills | Status: DC | PRN

## 2023-04-03 MED ORDER — MELOXICAM 15 MG PO TABS: 15.0000 mg | ORAL_TABLET | Freq: Every day | ORAL | 1 refills | Status: DC

## 2023-04-03 MED ORDER — GABAPENTIN 300 MG PO CAPS: ORAL_CAPSULE | ORAL | 4 refills | Status: DC

## 2023-04-03 NOTE — Assessment & Plan Note (Signed)
 Chronic, ongoing. Currently denies SI/HI. Taking Duloxetine, continue this, plus finding benefit from Wellbutrin XL.  Will increase this to 300 MG as is offering benefit to mood and smoking cessation.  Will continue Amitriptyline as needed for migraines and sleep.   Recommend she schedule f/u with Dr. Maryruth Bun as needed in future if worsening mood.  At this time she reports being stable, going through grieving process.

## 2023-04-03 NOTE — Assessment & Plan Note (Signed)
 Chronic, stable.  Followed by pain management, continue this collaboration, recent notes reviewed.

## 2023-04-03 NOTE — Assessment & Plan Note (Signed)
 Chronic, ongoing.  Continue current pain management collaboration and medication regimen as prescribed by them. Will increase Gabapentin per request to 300 MG in morning and 600 MG in the evening, to offer more benefit to neuropathy.  Recent eGFR stable.

## 2023-04-03 NOTE — Assessment & Plan Note (Signed)
 Chronic, stable.  Will continue Amitriptyline and Gabapentin.  Benefit to sleep and to migraines and chronic pain.

## 2023-04-03 NOTE — Assessment & Plan Note (Signed)
 Ongoing, stable. Recommend to continue to take plenty of water intake daily and add some salt to diet + wear compression on during day and off at night. Has seen cardiology with overall stable work-up. Would avoid HTN medications.

## 2023-04-03 NOTE — Assessment & Plan Note (Signed)
 Ongoing, stable.  Continue collaboration with oncology, recent note reviewed.

## 2023-04-03 NOTE — Assessment & Plan Note (Signed)
 Quit in November 2024 with Wellbutrin.  Will increase Wellbutrin XL to 300 MG daily for mood and smoking cessation.  Is doing well with this.

## 2023-04-03 NOTE — Progress Notes (Signed)
 BP 130/77   Pulse 71   Temp 98 F (36.7 C) (Oral)   Ht 5\' 4"  (1.626 m)   Wt 204 lb 12.8 oz (92.9 kg)   LMP  (LMP Unknown) Comment: LAST PERIOD IN MAY 2019 WHEN SHE STARTED CHEMO  SpO2 98%   BMI 35.15 kg/m    Subjective:    Patient ID: Carolyn Roy, female    DOB: 08/15/1965, 58 y.o.   MRN: [id number]  HPI: Carolyn Roy is a 58 y.o. female  Chief Complaint  Patient presents with   Diabetes   Hyperlipidemia   Hypertension   Depression   DIABETES A1c November 7.9%. Continues Metformin XR 1000 MG BID, Januvia 100 MG, and Glargine 50 units.  Trulicity stopped in past due to GI effects and Jardiance was too costly + Januvia was not covered. Hypoglycemic episodes:no Polydipsia/polyuria: no Visual disturbance: no Chest pain: no Paresthesias: no Glucose Monitoring: not recently, but machine broke, just got new one  Accucheck frequency:   Fasting glucose:   Post prandial:   Evening:   Before meals:  Taking Insulin?: yes  Long acting insulin: 50 units  Short acting insulin: Blood Pressure Monitoring: not checking Retinal Examination: Up To Date -- Dr. Janett Billow Exam: Up to Date Pneumovax: Up To Date Influenza: Up To Date Aspirin: no   HYPERLIPIDEMIA Taking Rosuvastatin 40 MG daily. Has underlying migraines on occasion, they are not as bad they were.  Has one every 2 weeks.  Imitrex offers benefit to these.  Last cardiology visit was in 2023 for hypotension, reassuring work-up. Quit smoking at the end of November after starting Wellbutrin. In past did quit for 6 years, but then started back.  Started smoking at age 69, smoked >35 years total. Satisfied with current treatment? yes Duration of hyperlipidemia: chronic Cholesterol medication side effects: no Cholesterol supplements: none Aspirin: no Recent stressors: no Recurrent headaches: no Visual changes: no Palpitations: occasional with anxiety Dyspnea: no Chest pain: no Lower extremity edema:  no Dizzy/lightheaded: no  The ASCVD Risk score (Arnett DK, et al., 2019) failed to calculate for the following reasons:   The valid total cholesterol range is 130 to 320 mg/dL   CHRONIC PAIN  Followed by pain clinic, last saw 02/22/23 continues Oxycodone and Gabapentin.  Would like to increase Gabapentin in the evening.  Well controlled. Pain control status: stable Duration: chronic Location: knees and back Quality: dull, aching, and throbbing Current Pain Level: 1/10 Previous Pain Level: 10/10 Breakthrough pain: no Benefit from narcotic medications: yes What Activities task can be accomplished with current medication? Able to work and perform ADLs Interested in weaning off narcotics:no   Stool softners/OTC fiber: yes  Previous pain specialty evaluation: yes Non-narcotic analgesic meds: no Narcotic contract: yes   DEPRESSION Taking Wellbutrin, Duloxetine, Amitriptyline.  In past followed by Dr. Maryruth Bun, last visit was 01-Nov-2021.  Her brother passed away months back, this caused increased anxiety and depression. Not currently going to therapy.  Breast cancer history, last saw oncology 03/01/23.  Was receiving infusions, every 3-6 months. She is off injections at this time.  Does get Zometa for bone density.  Still has port in place. Mood status: stable  Satisfied with current treatment?: yes Symptom severity: stable to mild  Duration of current treatment : chronic Side effects: no Medication compliance: good compliance Psychotherapy/counseling: none Depressed mood: yes Anxious mood: yes Anhedonia: no Significant weight loss or gain: no Insomnia: sleeping better now Fatigue: occasional Feelings of worthlessness or  guilt: no Impaired concentration/indecisiveness: no Suicidal ideations: no Hopelessness: no Crying spells: yes    04/03/2023    8:30 AM 01/03/2023    8:55 AM 09/27/2022    2:32 PM 06/06/2022    2:38 PM 05/25/2022    2:59 PM  Depression screen PHQ 2/9  Decreased  Interest 2 3 0 1 0  Down, Depressed, Hopeless 2 3 1 1  0  PHQ - 2 Score 4 6 1 2  0  Altered sleeping 0 2 2 3    Tired, decreased energy 0 2 1 1    Change in appetite 3 2 0 3   Feeling bad or failure about yourself  1 3 0 0   Trouble concentrating 1 2 0 2   Moving slowly or fidgety/restless 3 1 0 2   Suicidal thoughts 0 0 0 0   PHQ-9 Score 12 18 4 13    Difficult doing work/chores Somewhat difficult Somewhat difficult Not difficult at all        04/03/2023    8:30 AM 01/03/2023    8:55 AM 09/27/2022    2:33 PM 06/06/2022    2:38 PM  GAD 7 : Generalized Anxiety Score  Nervous, Anxious, on Edge 3 3 1 1   Control/stop worrying 1 3 1  0  Worry too much - different things 2 2 1  0  Trouble relaxing 1 2 0 1  Restless 3 2 0 1  Easily annoyed or irritable 1 3 1 1   Afraid - awful might happen 2 3 0 1  Total GAD 7 Score 13 18 4 5   Anxiety Difficulty Somewhat difficult Somewhat difficult Not difficult at all    Relevant past medical, surgical, family and social history reviewed and updated as indicated. Interim medical history since our last visit reviewed. Allergies and medications reviewed and updated.  Review of Systems  Constitutional:  Negative for activity change, appetite change, fatigue and fever.  Respiratory:  Negative for cough, chest tightness and shortness of breath.   Cardiovascular:  Negative for chest pain, palpitations and leg swelling.  Gastrointestinal: Negative.   Endocrine: Negative for cold intolerance, heat intolerance, polydipsia, polyphagia and polyuria.  Genitourinary:  Negative for difficulty urinating.  Musculoskeletal:  Negative for myalgias.  Neurological: Negative.  Negative for dizziness and light-headedness.  Psychiatric/Behavioral:  Negative for decreased concentration, self-injury, sleep disturbance and suicidal ideas. The patient is nervous/anxious.    Per HPI unless specifically indicated above     Objective:    BP 130/77   Pulse 71   Temp 98 F (36.7  C) (Oral)   Ht 5\' 4"  (1.626 m)   Wt 204 lb 12.8 oz (92.9 kg)   LMP  (LMP Unknown) Comment: LAST PERIOD IN MAY 2019 WHEN SHE STARTED CHEMO  SpO2 98%   BMI 35.15 kg/m   Wt Readings from Last 3 Encounters:  04/03/23 204 lb 12.8 oz (92.9 kg)  03/01/23 200 lb (90.7 kg)  02/22/23 198 lb (89.8 kg)    Physical Exam Vitals and nursing note reviewed.  Constitutional:      General: She is awake. She is not in acute distress.    Appearance: Normal appearance. She is well-developed and well-groomed. She is obese. She is not ill-appearing or toxic-appearing.  HENT:     Head: Normocephalic.     Right Ear: Hearing and external ear normal.     Left Ear: Hearing and external ear normal.  Eyes:     General: Lids are normal.  Right eye: No discharge.        Left eye: No discharge.     Conjunctiva/sclera: Conjunctivae normal.     Pupils: Pupils are equal, round, and reactive to light.  Neck:     Thyroid: No thyromegaly.     Vascular: No carotid bruit.  Cardiovascular:     Rate and Rhythm: Normal rate and regular rhythm.     Heart sounds: Normal heart sounds. No murmur heard.    No gallop.  Pulmonary:     Effort: Pulmonary effort is normal. No accessory muscle usage or respiratory distress.     Breath sounds: Normal breath sounds.  Abdominal:     General: Bowel sounds are normal. There is no distension.     Palpations: Abdomen is soft.     Tenderness: There is no abdominal tenderness.  Musculoskeletal:     Cervical back: Normal range of motion and neck supple.     Right lower leg: No edema.     Left lower leg: No edema.  Lymphadenopathy:     Cervical: No cervical adenopathy.  Skin:    General: Skin is warm and dry.  Neurological:     Mental Status: She is alert and oriented to person, place, and time.     Deep Tendon Reflexes: Reflexes are normal and symmetric.     Reflex Scores:      Brachioradialis reflexes are 2+ on the right side and 2+ on the left side.      Patellar  reflexes are 2+ on the right side and 2+ on the left side. Psychiatric:        Attention and Perception: Attention normal.        Mood and Affect: Mood normal.        Speech: Speech normal.        Behavior: Behavior normal. Behavior is cooperative.        Thought Content: Thought content normal.    Results for orders placed or performed in visit on 03/01/23  Cancer antigen 27.29   Collection Time: 03/01/23  2:05 PM  Result Value Ref Range   CA 27.29 <9.0 0.0 - 38.6 U/mL  Ferritin   Collection Time: 03/01/23  2:05 PM  Result Value Ref Range   Ferritin 109 11 - 307 ng/mL  FSH/LH   Collection Time: 03/01/23  2:05 PM  Result Value Ref Range   LH <0.3 mIU/mL   FSH 2.9 mIU/mL  Estradiol   Collection Time: 03/01/23  2:05 PM  Result Value Ref Range   Estradiol <5.0 pg/mL  Iron and TIBC   Collection Time: 03/01/23  2:05 PM  Result Value Ref Range   Iron 60 28 - 170 ug/dL   TIBC 409 811 - 914 ug/dL   Saturation Ratios 20 10.4 - 31.8 %   UIBC 234 ug/dL  CMP (Cancer Center only)   Collection Time: 03/01/23  2:05 PM  Result Value Ref Range   Sodium 143 135 - 145 mmol/L   Potassium 3.4 (L) 3.5 - 5.1 mmol/L   Chloride 103 98 - 111 mmol/L   CO2 30 22 - 32 mmol/L   Glucose, Bld 181 (H) 70 - 99 mg/dL   BUN 14 6 - 20 mg/dL   Creatinine 7.82 9.56 - 1.00 mg/dL   Calcium 9.8 8.9 - 21.3 mg/dL   Total Protein 6.2 (L) 6.5 - 8.1 g/dL   Albumin 3.4 (L) 3.5 - 5.0 g/dL   AST 18 15 - 41 U/L  ALT 15 0 - 44 U/L   Alkaline Phosphatase 62 38 - 126 U/L   Total Bilirubin 0.5 0.0 - 1.2 mg/dL   GFR, Estimated >16 >10 mL/min   Anion gap 10 5 - 15  CBC with Differential (Cancer Center Only)   Collection Time: 03/01/23  2:05 PM  Result Value Ref Range   WBC Count 6.5 4.0 - 10.5 K/uL   RBC 3.50 (L) 3.87 - 5.11 MIL/uL   Hemoglobin 11.0 (L) 12.0 - 15.0 g/dL   HCT 96.0 (L) 45.4 - 09.8 %   MCV 96.3 80.0 - 100.0 fL   MCH 31.4 26.0 - 34.0 pg   MCHC 32.6 30.0 - 36.0 g/dL   RDW 11.9 14.7 - 82.9 %    Platelet Count 188 150 - 400 K/uL   nRBC 0.0 0.0 - 0.2 %   Neutrophils Relative % 64 %   Neutro Abs 4.3 1.7 - 7.7 K/uL   Lymphocytes Relative 27 %   Lymphs Abs 1.8 0.7 - 4.0 K/uL   Monocytes Relative 6 %   Monocytes Absolute 0.4 0.1 - 1.0 K/uL   Eosinophils Relative 1 %   Eosinophils Absolute 0.1 0.0 - 0.5 K/uL   Basophils Relative 1 %   Basophils Absolute 0.0 0.0 - 0.1 K/uL   Immature Granulocytes 1 %   Abs Immature Granulocytes 0.03 0.00 - 0.07 K/uL      Assessment & Plan:   Problem List Items Addressed This Visit       Cardiovascular and Mediastinum   Hypotension   Ongoing, stable. Recommend to continue to take plenty of water intake daily and add some salt to diet + wear compression on during day and off at night. Has seen cardiology with overall stable work-up. Would avoid HTN medications.      Migraine headache without aura   Chronic, ongoing.  Imitrex as needed + Amitriptyline daily.  Continue this regimen and adjust as needed.      Relevant Medications   SUMAtriptan (IMITREX) 100 MG tablet   gabapentin (NEURONTIN) 300 MG capsule   buPROPion (WELLBUTRIN XL) 300 MG 24 hr tablet   meloxicam (MOBIC) 15 MG tablet     Endocrine   Hyperlipidemia associated with type 2 diabetes mellitus (HCC)   Chronic, ongoing.  Continue Rosuvastatin 40 MG daily.  Lipid panel today and adjust dose as needed.      Relevant Orders   Bayer DCA Hb A1c Waived   Comprehensive metabolic panel   Lipid Panel w/o Chol/HDL Ratio   Type 2 diabetes mellitus with hyperglycemia, with long-term current use of insulin (HCC) - Primary   Chronic, ongoing.  A1c 7.6% today, prior was 7.9%, trending down a little. Eye exam and foot exam up to date.  Cannot take Jardiance due to cost. Trulicity caused GI issues.   - Continue Lantus but increase to 55 units and back down to 40 units if too many lows <70 with this change. -  Will continue Metformin and Januvia. May benefit stopping DPP4 in future and retrial  of Ozempic if cost effective and covered.   -  Plan on PharmD referral if issues getting medications -  Recommend monitor BS at home TID and heavily focus on diet changes.  -  Urine ALB 150 February 2025, can not take ACE/ARB due to hypotension.  Statin on board.   -  Return in 3 months for A1c check.  Will have return to endo if note increased poor control.  Relevant Orders   Bayer DCA Hb A1c Waived   Microalbumin, Urine Waived   TSH     Nervous and Auditory   Chemotherapy-induced neuropathy (HCC) (Chronic)   Chronic, ongoing.  Continue current pain management collaboration and medication regimen as prescribed by them. Will increase Gabapentin per request to 300 MG in morning and 600 MG in the evening, to offer more benefit to neuropathy.  Recent eGFR stable.      Relevant Medications   gabapentin (NEURONTIN) 300 MG capsule   buPROPion (WELLBUTRIN XL) 300 MG 24 hr tablet   Nicotine dependence, cigarettes, w unsp disorders   Quit in November 2024 with Wellbutrin.  Will increase Wellbutrin XL to 300 MG daily for mood and smoking cessation.  Is doing well with this.        Other   Chronic pain syndrome (Chronic)   Chronic, stable.  Followed by pain management, continue this collaboration, recent notes reviewed.      Relevant Medications   gabapentin (NEURONTIN) 300 MG capsule   buPROPion (WELLBUTRIN XL) 300 MG 24 hr tablet   meloxicam (MOBIC) 15 MG tablet   Uncomplicated opioid dependence (HCC) (Chronic)   Chronic, stable.  Followed by pain management, continue this collaboration, recent notes reviewed.      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  Will continue Amitriptyline and Gabapentin.  Benefit to sleep and to migraines and chronic pain.        Malignant neoplasm of lower-outer quadrant of left breast of female, estrogen receptor positive (HCC)   Ongoing, stable.  Continue collaboration with oncology, recent note reviewed.      Obesity   BMI 35.15, some  gain with smoking cessation.  Recommended eating smaller high protein, low fat meals more frequently and exercising 30 mins a day 5 times a week with a goal of 10-15lb weight loss in the next 3 months. Patient voiced their understanding and motivation to adhere to these recommendations.       Severe depression (HCC)   Chronic, ongoing. Currently denies SI/HI. Taking Duloxetine, continue this, plus finding benefit from Wellbutrin XL.  Will increase this to 300 MG as is offering benefit to mood and smoking cessation.  Will continue Amitriptyline as needed for migraines and sleep.   Recommend she schedule f/u with Dr. Maryruth Bun as needed in future if worsening mood.  At this time she reports being stable, going through grieving process.      Relevant Medications   buPROPion (WELLBUTRIN XL) 300 MG 24 hr tablet   Other Relevant Orders   TSH     Follow up plan: Return in about 3 months (around 07/01/2023) for T2DM, HTN/HLD, MOOD, CHRONIC PAIN, GERD.

## 2023-04-03 NOTE — Assessment & Plan Note (Signed)
 Chronic, ongoing.  Continue Rosuvastatin 40 MG daily.  Lipid panel today and adjust dose as needed.

## 2023-04-03 NOTE — Assessment & Plan Note (Signed)
 BMI 35.15, some gain with smoking cessation.  Recommended eating smaller high protein, low fat meals more frequently and exercising 30 mins a day 5 times a week with a goal of 10-15lb weight loss in the next 3 months. Patient voiced their understanding and motivation to adhere to these recommendations.

## 2023-04-03 NOTE — Assessment & Plan Note (Signed)
 Chronic, ongoing.  A1c 7.6% today, prior was 7.9%, trending down a little. Eye exam and foot exam up to date.  Cannot take Jardiance due to cost. Trulicity caused GI issues.   - Continue Lantus but increase to 55 units and back down to 40 units if too many lows <70 with this change. -  Will continue Metformin and Januvia. May benefit stopping DPP4 in future and retrial of Ozempic if cost effective and covered.   -  Plan on PharmD referral if issues getting medications -  Recommend monitor BS at home TID and heavily focus on diet changes.  -  Urine ALB 150 February 2025, can not take ACE/ARB due to hypotension.  Statin on board.   -  Return in 3 months for A1c check.  Will have return to endo if note increased poor control.

## 2023-04-03 NOTE — Assessment & Plan Note (Signed)
 Chronic, ongoing.  Imitrex as needed + Amitriptyline daily.  Continue this regimen and adjust as needed.

## 2023-04-04 ENCOUNTER — Encounter: Payer: Self-pay | Admitting: Nurse Practitioner

## 2023-04-04 ENCOUNTER — Other Ambulatory Visit: Payer: Self-pay | Admitting: Nurse Practitioner

## 2023-04-04 DIAGNOSIS — R7989 Other specified abnormal findings of blood chemistry: Secondary | ICD-10-CM

## 2023-04-04 LAB — COMPREHENSIVE METABOLIC PANEL
ALT: 18 [IU]/L (ref 0–32)
AST: 18 [IU]/L (ref 0–40)
Albumin: 3.9 g/dL (ref 3.8–4.9)
Alkaline Phosphatase: 86 [IU]/L (ref 44–121)
BUN/Creatinine Ratio: 14 (ref 9–23)
BUN: 15 mg/dL (ref 6–24)
Bilirubin Total: 0.4 mg/dL (ref 0.0–1.2)
CO2: 26 mmol/L (ref 20–29)
Calcium: 9.4 mg/dL (ref 8.7–10.2)
Chloride: 102 mmol/L (ref 96–106)
Creatinine, Ser: 1.09 mg/dL — ABNORMAL HIGH (ref 0.57–1.00)
Globulin, Total: 2.2 g/dL (ref 1.5–4.5)
Glucose: 154 mg/dL — ABNORMAL HIGH (ref 70–99)
Potassium: 4 mmol/L (ref 3.5–5.2)
Sodium: 141 mmol/L (ref 134–144)
Total Protein: 6.1 g/dL (ref 6.0–8.5)
eGFR: 60 mL/min/{1.73_m2} (ref >59)

## 2023-04-04 LAB — LIPID PANEL W/O CHOL/HDL RATIO
Cholesterol, Total: 162 mg/dL (ref 100–199)
HDL: 52 mg/dL (ref >39)
LDL Chol Calc (NIH): 88 mg/dL (ref 0–99)
Triglycerides: 125 mg/dL (ref 0–149)
VLDL Cholesterol Cal: 22 mg/dL (ref 5–40)

## 2023-04-04 LAB — TSH: TSH: 5.46 u[IU]/mL — ABNORMAL HIGH (ref 0.450–4.500)

## 2023-04-04 NOTE — Progress Notes (Signed)
 Contacted via MyChart -- lab only visit in 4 weeks please   Good evening Carolyn Roy, your labs have returned: - Kidney function, creatinine and eGFR, remains stable, as is liver function, AST and ALT.  - Lipid panel shows some elevations this check from baseline for you, please ensure you are taking your Rosuvastatin daily. - TSH, thyroid level, is a little elevated.  This can mean thyroid may be a little sluggish, but levels can vary.  I would like to recheck this on outpatient labs in 4 weeks please.  Staff will call and schedule this.  Any questions? Keep being stellar!!  Thank you for allowing me to participate in your care.  I appreciate you. Kindest regards, Donella Pascarella

## 2023-04-06 NOTE — Progress Notes (Signed)
 Lab appt scheduled.

## 2023-05-04 ENCOUNTER — Other Ambulatory Visit: Payer: Commercial Managed Care - PPO

## 2023-05-04 ENCOUNTER — Other Ambulatory Visit

## 2023-05-22 ENCOUNTER — Encounter: Payer: Self-pay | Admitting: Nurse Practitioner

## 2023-05-22 ENCOUNTER — Ambulatory Visit: Payer: Commercial Managed Care - PPO | Attending: Pain Medicine | Admitting: Pain Medicine

## 2023-05-22 ENCOUNTER — Encounter: Payer: Self-pay | Admitting: Pain Medicine

## 2023-05-22 VITALS — BP 144/91 | HR 94 | Temp 97.4°F | Resp 14 | Ht 64.0 in | Wt 200.0 lb

## 2023-05-22 DIAGNOSIS — M25551 Pain in right hip: Secondary | ICD-10-CM

## 2023-05-22 DIAGNOSIS — M25562 Pain in left knee: Secondary | ICD-10-CM | POA: Diagnosis present

## 2023-05-22 DIAGNOSIS — G8929 Other chronic pain: Secondary | ICD-10-CM | POA: Insufficient documentation

## 2023-05-22 DIAGNOSIS — M47819 Spondylosis without myelopathy or radiculopathy, site unspecified: Secondary | ICD-10-CM | POA: Diagnosis present

## 2023-05-22 DIAGNOSIS — Z794 Long term (current) use of insulin: Secondary | ICD-10-CM

## 2023-05-22 DIAGNOSIS — M47816 Spondylosis without myelopathy or radiculopathy, lumbar region: Secondary | ICD-10-CM | POA: Diagnosis present

## 2023-05-22 DIAGNOSIS — M542 Cervicalgia: Secondary | ICD-10-CM | POA: Diagnosis present

## 2023-05-22 DIAGNOSIS — M79672 Pain in left foot: Secondary | ICD-10-CM | POA: Diagnosis not present

## 2023-05-22 DIAGNOSIS — M4316 Spondylolisthesis, lumbar region: Secondary | ICD-10-CM | POA: Diagnosis present

## 2023-05-22 DIAGNOSIS — M79671 Pain in right foot: Secondary | ICD-10-CM | POA: Diagnosis not present

## 2023-05-22 DIAGNOSIS — G893 Neoplasm related pain (acute) (chronic): Secondary | ICD-10-CM | POA: Diagnosis present

## 2023-05-22 DIAGNOSIS — Z79899 Other long term (current) drug therapy: Secondary | ICD-10-CM | POA: Diagnosis present

## 2023-05-22 DIAGNOSIS — M25552 Pain in left hip: Secondary | ICD-10-CM | POA: Diagnosis present

## 2023-05-22 DIAGNOSIS — M2540 Effusion, unspecified joint: Secondary | ICD-10-CM | POA: Insufficient documentation

## 2023-05-22 DIAGNOSIS — M79641 Pain in right hand: Secondary | ICD-10-CM

## 2023-05-22 DIAGNOSIS — G894 Chronic pain syndrome: Secondary | ICD-10-CM

## 2023-05-22 DIAGNOSIS — M79642 Pain in left hand: Secondary | ICD-10-CM | POA: Diagnosis present

## 2023-05-22 DIAGNOSIS — M25561 Pain in right knee: Secondary | ICD-10-CM | POA: Diagnosis present

## 2023-05-22 DIAGNOSIS — R937 Abnormal findings on diagnostic imaging of other parts of musculoskeletal system: Secondary | ICD-10-CM | POA: Insufficient documentation

## 2023-05-22 DIAGNOSIS — M545 Low back pain, unspecified: Secondary | ICD-10-CM | POA: Insufficient documentation

## 2023-05-22 DIAGNOSIS — Z79891 Long term (current) use of opiate analgesic: Secondary | ICD-10-CM | POA: Insufficient documentation

## 2023-05-22 MED ORDER — OXYCODONE HCL 5 MG PO TABS: 5.0000 mg | ORAL_TABLET | Freq: Two times a day (BID) | ORAL | 0 refills | Status: DC | PRN

## 2023-05-22 MED ORDER — METFORMIN HCL 1000 MG PO TABS: 1000.0000 mg | ORAL_TABLET | Freq: Two times a day (BID) | ORAL | 3 refills | Status: DC

## 2023-05-22 NOTE — Progress Notes (Signed)
 Nursing Pain Medication Assessment:  Safety precautions to be maintained throughout the outpatient stay will include: orient to surroundings, keep bed in low position, maintain call bell within reach at all times, provide assistance with transfer out of bed and ambulation.  Medication Inspection Compliance: Pill count conducted under aseptic conditions, in front of the patient. Neither the pills nor the bottle was removed from the patient's sight at any time. Once count was completed pills were immediately returned to the patient in their original bottle.  Medication: Oxycodone IR Pill/Patch Count:7 pills out of 60 Pill/Patch Appearance: Markings consistent with prescribed medication Bottle Appearance: Standard pharmacy container. Clearly labeled. Filled Date: 03 / 16 / 2025 Last Medication intake:  Today

## 2023-05-22 NOTE — Progress Notes (Signed)
 PROVIDER NOTE: Interpretation of information contained herein should be left to medically-trained personnel. Specific patient instructions are provided elsewhere under "Patient Instructions" section of medical record. This document was created in part using AI and STT-dictation technology, any transcriptional errors that may result from this process are unintentional.  Patient: Carolyn Roy  Service: E/M   PCP: Marjie Skiff, NP  DOB: [birth year]  DOS: 05/22/2023  Provider: Oswaldo Done, MD  MRN: [id number]  Delivery: Face-to-face  Specialty: Interventional Pain Management  Type: Established Patient  Setting: Ambulatory outpatient facility  Specialty designation: 09  Referring Prov.: Marjie Skiff, NP  Location: Outpatient office facility       HPI  [name], a 58 y.o. year old female, is here today because of her Chronic pain syndrome [G89.4]. Ms. Gallery primary complain today is Back Pain (lower)  Pertinent problems: Ms. Jankowiak has Chronic fatigue; Family history of breast cancer; Chemotherapy-induced neuropathy (HCC); Malignant neoplasm of lower-outer quadrant of left breast of female, estrogen receptor positive (HCC); Chronic feet pain (1ry area of Pain) (Bilateral) (R>L); Neuropathic pain of feet (Bilateral); Chronic knee pain (2ry area of Pain) (Bilateral) (R>L); Chronic hand pain (3ry area of Pain) (Bilateral) (R>L); Chronic pain syndrome; Chronic hip pain (4th area of Pain) (Bilateral) (R>L); Chronic upper extremity pain (3ry area of Pain) (Bilateral) (R>L); Cancer-related pain; Osteoarthritis of knee (Right); Chronic low back pain (Bilateral (L>R) w/o sciatica; Chronic hip pain (Left); Lumbar facet syndrome (Bilateral) (L>R); Idiopathic scoliosis; Chronic sacroiliac joint pain (Left); Chronic pain of knee (Right); Abnormal MRI, lumbar spine (04/20/2018); Lumbar facet arthropathy; Spondylosis without myelopathy or radiculopathy, lumbosacral region; DDD (degenerative  disc disease), lumbosacral; Numbness and tingling of upper extremity (C6/C7 dermatomes) (Right); Personal history of breast cancer; Cervicalgia; Cervical radiculitis (Right); Effusion of knee joint (Right); Tricompartment osteoarthritis of knee (Right); Migraine headache without aura; 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 Pain Assessment: Severity of Chronic pain is reported as a 5 /10. Location: Back Lower/left leg to back of the knee. Onset: More than a month ago. Quality: Burning, Sharp, Dull. Timing: Intermittent. Modifying factor(s): elevation of legs, heating pad. Vitals:  height is 5\' 4"  (1.626 m) and weight is 200 lb (90.7 kg). Her temporal temperature is 97.4 F (36.3 C) (abnormal). Her blood pressure is 144/91 (abnormal) and her pulse is 94. Her respiration is 14 and oxygen saturation is 97%.  BMI: Estimated body mass index is 34.33 kg/m as calculated from the following:   Height as of this encounter: 5\' 4"  (1.626 m).   Weight as of this encounter: 200 lb (90.7 kg). Last encounter: 02/22/2023. Last procedure: Visit date not found.  Reason for encounter: medication management.  The patient indicates doing well with the current medication regimen. No adverse reactions or side effects reported to the medications.   PMP: The patient was last seen on 02/22/2023 at which time I wrote for 3 oxycodone IR 5 mg tablet prescriptions.  Apparently she still had some of the prescriptions from 11/14/2022 and the pharmacy.  She has only used 1 out of the 3 prescriptions that I wrote on 02/22/2023.  Today we will call the pharmacy and cancel those prescriptions and provide her with a new set of prescriptions.  Her last prescription filled was on 04/23/2023, which should have lasted until 05/28/2023.  Today we will call the pharmacy and cancel  the remaining 2  prescriptions from 02/22/2023.  Discussed the use of AI scribe software for clinical note transcription with the patient, who gave verbal consent to proceed.  History of Present Illness   Carolyn Roy is a 58 year old female who presents for medication management and evaluation of lower back pain.  She experiences chronic lower back pain that radiates to her left knee. The pain may be exacerbated by her sleeping position or lack of sleep. She is currently managing her pain with medication and does not require injections at this time.  She has difficulty sleeping, particularly on Sundays, due to staying up late on Friday and Saturday nights, which affects her ability to fall asleep on Sunday nights.     RTCB: 08/26/2023   Pharmacotherapy Assessment  Analgesic: Oxycodone IR 5 mg, 1 tab PO BID (10 mg/day of oxycodone) (Average: 2 tabs/day = 10 mg/day of oxycodone) MME/day: 15 mg/day.    Monitoring: [place] PMP: PDMP reviewed during this encounter.       Pharmacotherapy: No side-effects or adverse reactions reported. Compliance: No problems identified. Effectiveness: Clinically acceptable.  Wheatley, Dena L, RN  05/22/2023 11:35 AM  Sign when Signing Visit Nursing Pain Medication Assessment:  Safety precautions to be maintained throughout the outpatient stay will include: orient to surroundings, keep bed in low position, maintain call bell within reach at all times, provide assistance with transfer out of bed and ambulation.  Medication Inspection Compliance: Pill count conducted under aseptic conditions, in front of the patient. Neither the pills nor the bottle was removed from the patient\'s sight at any time. Once count was completed pills were immediately returned to the patient in their original bottle.  Medication: Oxycodone IR Pill/Patch Count:7 pills out of 60 Pill/Patch Appearance: Markings consistent with prescribed medication Bottle Appearance: Standard pharmacy container.  Clearly labeled. Filled Date: 03 / 16 / 2025 Last Medication intake:  Today    No results found for: "CBDTHCR" No results found for: "D8THCCBX" No results found for: "D9THCCBX"  UDS:  Summary  Date Value Ref Range Status  08/22/2022 Note  Final    Comment:    ==================================================================== [employer] Select 13 (MW) ==================================================================== Test                             Result       Flag       Units  Drug Present and Declared for Prescription Verification   Oxycodone                      1803         EXPECTED   ng/mg creat   Oxymorphone                    74           EXPECTED   ng/mg creat   Noroxycodone                   2697         EXPECTED   ng/mg creat   Noroxymorphone                 82            EXPECTED   ng/mg creat    Sources of oxycodone are scheduled prescription medications.    Oxymorphone, noroxycodone, and noroxymorphone are expected    metabolites of oxycodone. Oxymorphone is also available as  a    scheduled prescription medication.  ==================================================================== Test                      Result    Flag   Units      Ref Range   Creatinine              119              mg/dL      >=60 ==================================================================== Declared Medications:  The flagging and interpretation on this report are based on the  following declared medications.  Unexpected results may arise from  inaccuracies in the declared medications.   **Note: The testing scope of this panel includes these medications:   Oxycodone (Roxicodone)   **Note: The testing scope of this panel does not include the  following reported medications:   Acetaminophen (Excedrin)  Aspirin (Excedrin)  Caffeine (Excedrin)  Duloxetine (Cymbalta)  Famotidine (Pepcid)  Goserelin  Insulin (Lantus)  Metformin (Glucophage)  Naloxone (Narcan)  Ondansetron  (Zofran)  Pregabalin (Lyrica)  Rosuvastatin (Crestor)  Sitagliptin (Januvia)  Sumatriptan (Imitrex)  Tamoxifen (Nolvadex)  Vitamin D2 (Drisdol) ==================================================================== For clinical consultation, please call [phone]. ====================================================================       ROS  Constitutional: Denies any fever or chills Gastrointestinal: No reported hemesis, hematochezia, vomiting, or acute GI distress Musculoskeletal: Denies any acute onset joint swelling, redness, loss of ROM, or weakness Neurological: No reported episodes of acute onset apraxia, aphasia, dysarthria, agnosia, amnesia, paralysis, loss of coordination, or loss of consciousness  Medication Review  DULoxetine, Insulin Glargine-aglr, Insulin Pen Needle, OneTouch Verio, SUMAtriptan, aspirin-acetaminophen-caffeine, buPROPion, famotidine, gabapentin, glucose blood, letrozole, meloxicam, naloxone, ondansetron, oxyCODONE, and rosuvastatin  History Review  Allergy: Ms. Griffing is allergic to aspirin. Drug: Ms. Brazeau  reports no history of drug use. Alcohol:  reports no history of alcohol use. Tobacco:  reports that she has quit smoking. Her smoking use included cigarettes. She started smoking about 11 years ago. She has a 11 pack-year smoking history. She has quit using smokeless tobacco.  Her smokeless tobacco use included snuff. Social: Ms. Sturgeon  reports that she has quit smoking. Her smoking use included cigarettes. She started smoking about 11 years ago. She has a 11 pack-year smoking history. She has quit using smokeless tobacco.  Her smokeless tobacco use included snuff. She reports that she does not drink alcohol and does not use drugs. Medical:  has a past medical history of Allergy, Breast cancer (HCC), Cancer (HCC) (06/15/2017), Depression, Diabetes mellitus without complication (HCC) (2017), Edema of left upper extremity, Endometriosis, Family  history of breast cancer, Headache, Hyperlipidemia, Lymphedema of left arm, Ovarian mass, Personal history of chemotherapy, Personal history of radiation therapy, and Pneumonia. Surgical: Ms. Klapper  has a past surgical history that includes Oophorectomy; Tubal ligation; Portacath placement (Right, 07/14/2017); Axillary lymph node biopsy (Left, 07/14/2017); Breast lumpectomy (Left, 01/12/2018); Breast biopsy (Left); Partial mastectomy with needle localization (Left, 01/12/2018); Sentinel node biopsy (Left, 01/12/2018); Colonoscopy with propofol (N/A, 12/27/2019); and Colonoscopy with propofol (N/A, 01/05/2022). Family: family history includes Breast cancer in an other family member; Breast cancer (age of onset: 35) in her maternal grandmother; Breast cancer (age of onset: 68) in an other family member; Breast cancer (age of onset: 26) in her maternal aunt; Cancer in her mother; Colon cancer in her maternal grandmother and mother; Diabetes in her brother; Other in her father; Pancreatitis in her brother; Prostate cancer (age of onset: 59) in her brother.  Laboratory Chemistry Profile  Renal Lab Results  Component Value Date   BUN 15 04/03/2023   CREATININE 1.09 (H) 04/03/2023   BCR 14 04/03/2023   GFRAA >60 10/21/2019   GFRNONAA >60 03/01/2023    Hepatic Lab Results  Component Value Date   AST 18 04/03/2023   ALT 18 04/03/2023   ALBUMIN 3.9 04/03/2023   ALKPHOS 86 04/03/2023   LIPASE 31 12/01/2020    Electrolytes Lab Results  Component Value Date   NA 141 04/03/2023   K 4.0 04/03/2023   CL 102 04/03/2023   CALCIUM 9.4 04/03/2023   MG 2.3 03/07/2022    Bone Lab Results  Component Value Date   VD25OH 99.50 11/29/2022    Inflammation (CRP: Acute Phase) (ESR: Chronic Phase) Lab Results  Component Value Date   CRP <1 12/21/2020   ESRSEDRATE 3 12/21/2020         Note: Above Lab results reviewed.  Recent Imaging Review  MM 3D SCREENING MAMMOGRAM BILATERAL BREAST CLINICAL DATA:   Screening.  EXAM: DIGITAL SCREENING BILATERAL MAMMOGRAM WITH TOMOSYNTHESIS AND CAD  TECHNIQUE: Bilateral screening digital craniocaudal and mediolateral oblique mammograms were obtained. Bilateral screening digital breast tomosynthesis was performed. The images were evaluated with computer-aided detection.  COMPARISON:  Previous exam(s).  ACR Breast Density Category b: There are scattered areas of fibroglandular density.  FINDINGS: There are no findings suspicious for malignancy.  IMPRESSION: No mammographic evidence of malignancy. A result letter of this screening mammogram will be mailed directly to the patient.  RECOMMENDATION: Screening mammogram in one year. (Code:SM-B-01Y)  BI-RADS CATEGORY  1: Negative.  Electronically Signed   By: Serena  Chacko M.D.   On: 09/19/2022 11:47 Note: Reviewed        Physical Exam  General appearance: Well nourished, well developed, and well hydrated. In no apparent acute distress Mental status: Alert, oriented x 3 (person, place, & time)       Respiratory: No evidence of acute respiratory distress Eyes: PERLA Vitals: BP (!) 144/91 (Cuff Size: Normal)   Pulse 94   Temp (!) 97.4 F (36.3 C) (Temporal)   Resp 14   Ht 5\' 4"  (1.626 m)   Wt 200 lb (90.7 kg)   LMP  (LMP Unknown) Comment: LAST PERIOD IN MAY 2019 WHEN SHE STARTED CHEMO  SpO2 97%   BMI 34.33 kg/m  BMI: Estimated body mass index is 34.33 kg/m as calculated from the following:   Height as of this encounter: 5\' 4"  (1.626 m).   Weight as of this encounter: 200 lb (90.7 kg). Ideal: Ideal body weight: 54.7 kg (120 lb 9.5 oz) Adjusted ideal body weight: 69.1 kg (152 lb 5.7 oz)  Assessment   Diagnosis Status  1. Chronic pain syndrome   2. Chronic feet pain (1ry area of Pain) (Bilateral) (R>L)   3. Chronic knee pain (2ry area of Pain) (Bilateral) (R>L)   4. Chronic hand pain (3ry area of Pain) (Bilateral) (R>L)   5. Cervicalgia   6. Cancer-related pain   7. Lumbar  facet syndrome (Bilateral) (L>R)   8. Chronic low back pain (Bilateral (L>R) w/o sciatica   9. Arthropathy of spinal facet joint concurrent with and due to effusion (L3-4, L4-5)   10. Chronic hip pain (4th area of Pain) (Bilateral) (R>L)   11. Grade 1 Anterolisthesis of lumbar spine (L3/L4 and L4/L5)   12. Abnormal MRI, lumbar spine (04/20/2018)   13. Pharmacologic therapy   14. Chronic use of opiate for therapeutic purpose   15. Encounter for medication  management   16. Encounter for chronic pain management    Controlled Controlled Controlled   Updated Problems: No problems updated.  Plan of Care  Problem-specific:  Assessment and Plan    Chronic lower back pain with radiculopathy   She experiences chronic lower back pain radiating to the left knee, possibly worsened by poor sleep or sleeping position. Pain is managed with medication, and no additional interventions like injections are needed. Continue the current pain medication regimen. Report if pain becomes unmanageable for potential intervention with injections.  Sleep disturbance   She has difficulty sleeping, especially on Sundays, due to a change in sleep schedule over the weekend, which may worsen her lower back pain. Maintain a consistent sleep schedule to improve sleep quality.  Medication management   She is transitioning to a new provider, Miss Randalyn Rhea, for medication management. Prescription monitoring program and urine drug screen are current. Medication refills will be sent to the pharmacy for the next three months. Transition medication management to Miss Randalyn Rhea.       Ms. CARLOS QUACKENBUSH has a current medication list which includes the following long-term medication(s): bupropion, duloxetine, gabapentin, [START ON 05/28/2023] oxycodone, [START ON 06/27/2023] oxycodone, [START ON 07/27/2023] oxycodone, rosuvastatin, and sumatriptan.  Pharmacotherapy (Medications Ordered): Meds ordered this encounter   Medications   oxyCODONE (OXY IR/ROXICODONE) 5 MG immediate release tablet    Sig: Take 1 tablet (5 mg total) by mouth 2 (two) times daily as needed for severe pain (pain score 7-10). Must last 30 days    Dispense:  60 tablet    Refill:  0    DO NOT: delete (not duplicate); no partial-fill (will deny script to complete), no refill request (F/U required). DISPENSE: 1 day early if closed on fill date. WARN: No CNS-depressants within 8 hrs of med.   oxyCODONE (OXY IR/ROXICODONE) 5 MG immediate release tablet    Sig: Take 1 tablet (5 mg total) by mouth 2 (two) times daily as needed for severe pain (pain score 7-10). Must last 30 days    Dispense:  60 tablet    Refill:  0    DO NOT: delete (not duplicate); no partial-fill (will deny script to complete), no refill request (F/U required). DISPENSE: 1 day early if closed on fill date. WARN: No CNS-depressants within 8 hrs of med.   oxyCODONE (OXY IR/ROXICODONE) 5 MG immediate release tablet    Sig: Take 1 tablet (5 mg total) by mouth 2 (two) times daily as needed for severe pain (pain score 7-10). Must last 30 days    Dispense:  60 tablet    Refill:  0    DO NOT: delete (not duplicate); no partial-fill (will deny script to complete), no refill request (F/U required). DISPENSE: 1 day early if closed on fill date. WARN: No CNS-depressants within 8 hrs of med.   Orders:  Orders Placed This Encounter  Procedures   Schedule appointment    From now on all medication management appointments and refills should be scheduled with our Mid-Level Practitioner Randalyn Rhea, NP).    Scheduling Instructions:     Please switch medication management visits to Mid-Level Practitioner's schedule Randalyn Rhea, NP).   Nursing Instructions:    Please call this patient's pharmacy and cancel any older opioid analgesic scripts that may be left from prior visits.  Tell the pharmacist to only keep those sent today.   Follow-up plan:   Return in about 3 months (around  08/26/2023).  Interventional Therapies  Risk  Complexity Considerations:   Estimated body mass index is 30.55 kg/m as calculated from the following:   Height as of this encounter: 5\' 4"  (1.626 m).   Weight as of this encounter: 178 lb (80.7 kg). WNL   Planned  Pending:      Under consideration:   Possible bilateral lumbar facet RFA  Diagnostic lumbar sympathetic Blk  Diagnostic right genicular NB  Possible right genicular nerve RFA    Completed:   Therapeutic right IA steroid knee inj. x1 (08/18/2020) (100/100/100 x 24 hours/80-85)  Diagnostic bilateral lumbar facet Blk x2 (08/16/18) (100/100/80/90-100)  Therapeutic/palliative right knee Hyalgan inj. x5 (06/25/2019) (100/100/75 x 1 week/70)    Therapeutic  Palliative (PRN) options:   Therapeutic right IA steroid knee inj.  Diagnostic bilateral lumbar facet MBB   Therapeutic/palliative right Hyalgan knee inj.     Pharmacotherapy  Nonopioids transfer 02/03/2020: Pregabalin (Lyrica) and alpha-lipoic acid      Recent Visits Date Type Provider Dept  02/22/23 Office Visit Renaldo Caroli, MD [facility]  Showing recent visits within past 90 days and meeting all other requirements Today's Visits Date Type Provider Dept  05/22/23 Office Visit Renaldo Caroli, MD [facility]  Showing today's visits and meeting all other requirements Future Appointments No visits were found meeting these conditions. Showing future appointments within next 90 days and meeting all other requirements  I discussed the assessment and treatment plan with the patient. The patient was provided an opportunity to ask questions and all were answered. The patient agreed with the plan and demonstrated an understanding of the instructions.  Patient advised to call back or seek an in-person evaluation if the symptoms or condition worsens.  Duration of encounter: 44 minutes.  Total time on encounter, as per AMA guidelines  included both the face-to-face and non-face-to-face time personally spent by the physician and/or other qualified health care professional(s) on the day of the encounter (includes time in activities that require the physician or other qualified health care professional and does not include time in activities normally performed by clinical staff). Physician's time may include the following activities when performed: Preparing to see the patient (e.g., pre-charting review of records, searching for previously ordered imaging, lab work, and nerve conduction tests) Review of prior analgesic pharmacotherapies. Reviewing PMP Interpreting ordered tests (e.g., lab work, imaging, nerve conduction tests) Performing post-procedure evaluations, including interpretation of diagnostic procedures Obtaining and/or reviewing separately obtained history Performing a medically appropriate examination and/or evaluation Counseling and educating the patient/family/caregiver Ordering medications, tests, or procedures Referring and communicating with other health care professionals (when not separately reported) Documenting clinical information in the electronic or other health record Independently interpreting results (not separately reported) and communicating results to the patient/ family/caregiver Care coordination (not separately reported)  Note by: Candi Chafe, MD (TTS and AI technology used. I apologize for any typographical errors that were not detected and corrected.) Date: 05/22/2023; Time: 12:03 PM

## 2023-05-23 NOTE — Addendum Note (Signed)
 Addended by: Dora Simeone T on: 05/23/2023 01:17 PM   Modules accepted: Orders

## 2023-05-24 ENCOUNTER — Other Ambulatory Visit

## 2023-05-24 ENCOUNTER — Telehealth: Payer: Self-pay | Admitting: *Deleted

## 2023-05-24 DIAGNOSIS — R7989 Other specified abnormal findings of blood chemistry: Secondary | ICD-10-CM

## 2023-05-24 NOTE — Progress Notes (Signed)
 Care Guide Pharmacy Note  05/24/2023 Name: Carolyn Roy MRN: [id number] DOB: [birth year]  Referred By: Lemar Pyles, NP Reason for referral: Complex Care Management and Call Attempt #1 (Outreach to schedule referral with pharmacist )   Carolyn Roy is a 58 y.o. year old female who is a primary care patient of Cannady, Jolene T, NP.  Carolyn Roy was referred to the pharmacist for assistance related to: DMII  Successful contact was made with the patient to discuss pharmacy services including being ready for the pharmacist to call at least 5 minutes before the scheduled appointment time and to have medication bottles and any blood pressure readings ready for review. The patient agreed to meet with the pharmacist via telephone visit on 06/05/2023  Kandis Ormond, CMA [place]  [facility], [facility] Guide Direct Dial: [phone]  Fax: [phone] Website: [email or web address]

## 2023-05-24 NOTE — Progress Notes (Signed)
 Care Guide Pharmacy Note  05/24/2023 Name: [name] MRN: [id number] DOB: [birth year]  Referred By: Lemar Pyles, NP Reason for referral: Complex Care Management and Call Attempt #1 (Outreach to schedule referral with pharmacist )   Carolyn Roy is a 58 y.o. year old female who is a primary care patient of Cannady, Jolene T, NP.  Carolyn Roy was referred to the pharmacist for assistance related to: DMII  An unsuccessful telephone outreach was attempted today to contact the patient who was referred to the pharmacy team for assistance with medication management. Additional attempts will be made to contact the patient.  Kandis Ormond, CMA [place]  [facility], [facility] Guide Direct Dial: [phone]  Fax: [phone] Website: [email or web address]

## 2023-05-25 ENCOUNTER — Encounter: Payer: Self-pay | Admitting: Nurse Practitioner

## 2023-05-25 LAB — TSH: TSH: 3.74 u[IU]/mL (ref 0.450–4.500)

## 2023-05-25 LAB — T4, FREE: Free T4: 1.32 ng/dL (ref 0.82–1.77)

## 2023-05-25 LAB — THYROID PEROXIDASE ANTIBODY: Thyroperoxidase Ab SerPl-aCnc: 13 [IU]/mL (ref 0–34)

## 2023-05-25 NOTE — Progress Notes (Signed)
 Contacted via MyChart   Thyroid labs this check are normal.  No changes needed:)

## 2023-05-30 ENCOUNTER — Inpatient Hospital Stay: Payer: Commercial Managed Care - PPO | Attending: Internal Medicine

## 2023-05-30 DIAGNOSIS — Z1721 Progesterone receptor positive status: Secondary | ICD-10-CM | POA: Insufficient documentation

## 2023-05-30 DIAGNOSIS — C50512 Malignant neoplasm of lower-outer quadrant of left female breast: Secondary | ICD-10-CM | POA: Diagnosis present

## 2023-05-30 DIAGNOSIS — Z1732 Human epidermal growth factor receptor 2 negative status: Secondary | ICD-10-CM | POA: Insufficient documentation

## 2023-05-30 DIAGNOSIS — Z452 Encounter for adjustment and management of vascular access device: Secondary | ICD-10-CM | POA: Insufficient documentation

## 2023-05-30 DIAGNOSIS — Z170 Estrogen receptor positive status [ER+]: Secondary | ICD-10-CM | POA: Insufficient documentation

## 2023-05-30 MED ORDER — BD PEN NEEDLE NANO 2ND GEN 32G X 4 MM MISC
12 refills | Status: AC
Start: 2023-05-30 — End: ?

## 2023-06-05 ENCOUNTER — Other Ambulatory Visit: Payer: Self-pay

## 2023-06-05 NOTE — Progress Notes (Signed)
 06/05/2023 Name: Carolyn Roy MRN: [id number] DOB: 02/05/1966  Chief Complaint  Patient presents with   Diabetes Management Plan   Carolyn Roy is a 58 y.o. year old female who presented for a telephone visit.   They were referred to the pharmacist by their PCP for assistance in managing diabetes.   Subjective:  Care Team: Primary Care Provider: Lemar Pyles, NP ; Next Scheduled Visit: 5/28  Medication Access/Adherence  Current Pharmacy:  [id number] - [place], [place] - [address] [place] [address] Phone: [phone] Fax: [phone]  [facility] - [place], [place] - [address] [place] [address] Phone: [phone] Fax: [phone]  -Patient reports affordability concerns with their medications: Yes  -Patient reports access/transportation concerns to their pharmacy: No  -Patient reports adherence concerns with their medications:  Yes    Diabetes: Current medications: metformin  1000mg  BID, Lantus  50 units daily  Medications tried in the past: did not tolerate GLP1 medications well previously -Previously taking Januvia  100mg  daily, and I had supplied copay card to pharmacy that took patient cost down to $5.  Patient states she has not had medication in approximately 3 months due to cost -Last A1c 7.6%  Objective:  Lab Results  Component Value Date   HGBA1C 7.6 (H) 04/03/2023   Lab Results  Component Value Date   CREATININE 1.09 (H) 04/03/2023   BUN 15 04/03/2023   NA 141 04/03/2023   K 4.0 04/03/2023   CL 102 04/03/2023   CO2 26 04/03/2023   Medications Reviewed Today     Reviewed by Linn Rich, RPH (Pharmacist) on 06/05/23 at 1449  Med List Status: <None>   Medication Order Taking? Sig Documenting Provider Last Dose Status Informant  aspirin -acetaminophen -caffeine (EXCEDRIN MIGRAINE) 250-250-65 MG tablet [id number]  Take 2 tablets by mouth daily as needed for headache. [provider]   Active Self  Blood Glucose Monitoring Suppl (ONETOUCH VERIO) w/Device KIT [id number]  Use to check blood sugar 3 times a day and document results, bring to appointments.  Goal is <130 fasting blood sugar and <180 two hours after meals. Cannady, Jolene T, NP  Active   buPROPion  (WELLBUTRIN  XL) 300 MG 24 hr tablet [id number]  Take 1 tablet (300 mg total) by mouth daily. Cannady, Jolene T, NP  Active   DULoxetine  (CYMBALTA ) 60 MG capsule [id number]  Take 1 capsule (60 mg total) by mouth daily. Cannady, Jolene T, NP  Active   famotidine  (PEPCID ) 20 MG tablet [id number]  Take 20 mg by mouth daily. [provider]  Active   gabapentin  (NEURONTIN ) 300 MG capsule [id number]  Take 300 MG (one capsule) by mouth in the morning and then take 600 MG (two capsules) by mouth in the evening. Doran Galloway T, NP  Active   glucose blood test strip [id number]  Use to check blood sugar 3 times daily, fasting in morning with goal <130 and 2 hours after meals with goal <180.  Bring blood sugar log to visits. Cannady, Jolene T, NP  Active   insulin  glargine (LANTUS ) 100 UNIT/ML injection [id number] Yes Inject 50 Units into the skin daily. [provider] Taking Active   Insulin  Pen Needle (BD PEN NEEDLE NANO 2ND GEN) 32G X 4 MM MISC [id number] Yes For use with insulin  pens daily. Cannady, Jolene T, NP Taking Active   letrozole  (FEMARA ) 2.5 MG tablet [id number]  Take 1 tablet (2.5 mg total) by mouth daily. Valentine Gasmen,  Govinda R, MD  Active   meloxicam  (MOBIC ) 15 MG tablet [id number]  Take 1 tablet (15 mg total) by mouth daily. Cannady, Jolene T, NP  Active   metFORMIN  (GLUCOPHAGE ) 1000 MG tablet [id number] Yes Take 1 tablet (1,000 mg total) by mouth 2 (two) times daily with a meal. Cannady, Jolene T, NP Taking Active   naloxone  (NARCAN ) nasal spray 4 mg/0.1 mL [id number]  Place 1 spray into the nose as needed for up to 365 doses (for opioid-induced respiratory depresssion). In case of emergency (overdose), spray once  into each nostril. If no response within 3 minutes, repeat application and call 911. Renaldo Caroli, MD  Active            Med Note [facility], FRANCISCO A   Wed Feb 22, 2023 12:03 PM) WARNING: [place] mandated prescription under the "STOP Act of 2017" requiring reversal medication to be available to patient when taking opioid medication capable of inducing respiratory depression and failure. DO NOT REMOVE!  ondansetron  (ZOFRAN ) 8 MG tablet [id number]  Take 1 tablet (8 mg total) by mouth 2 (two) times daily as needed for nausea or vomiting. Doran Galloway T, NP  Active   oxyCODONE  (OXY IR/ROXICODONE ) 5 MG immediate release tablet [id number]  Take 1 tablet (5 mg total) by mouth 2 (two) times daily as needed for severe pain (pain score 7-10). Must last 30 days Renaldo Caroli, MD  Active   oxyCODONE  (OXY IR/ROXICODONE ) 5 MG immediate release tablet [id number]  Take 1 tablet (5 mg total) by mouth 2 (two) times daily as needed for severe pain (pain score 7-10). Must last 30 days Renaldo Caroli, MD  Active   oxyCODONE  (OXY IR/ROXICODONE ) 5 MG immediate release tablet [id number]  Take 1 tablet (5 mg total) by mouth 2 (two) times daily as needed for severe pain (pain score 7-10). Must last 30 days Renaldo Caroli, MD  Active            Med Note [facility], FRANCISCO A   Mon May 22, 2023 11:52 AM) WARNING: Future prescription, NOT a DUPLICATE. DO NOT DELETE. Do not delete during hospital medication reconciliation or at discharge. DO NOT LABEL as "Patient not taking".  rosuvastatin  (CRESTOR ) 40 MG tablet [id number]  Take 1 tablet (40 mg total) by mouth daily. Cannady, Jolene T, NP  Active   sitaGLIPtin  (JANUVIA ) 100 MG tablet [id number] Yes Take 100 mg by mouth daily. [provider]  Active   SUMAtriptan  (IMITREX ) 100 MG tablet [id number]  Take 1 tablet (100 mg total) by mouth every 2 (two) hours as needed for migraine. May repeat in 2 hours if headache persists or recurs. Lemar Pyles, NP   Active   Med List Note Calton Catholic, RN 05/22/23 1514): MR 08-26-23 UDS 08/22/22 Medication agreement signed 02-14-18             Assessment/Plan:   Diabetes: -Currently uncontrolled -Able to obtain new manufacturer copay card for patient.  [facility], and copay for 1 month will be $35 since card has a max of $150 it will pay towards copay every 30 days. -Attempted to call patient to see if copay will be affordable, but I was not able to reach her.  HIPAA compliant voicemail was left, and I am sending a MyChart message.  Linn Rich, PharmD, DPLA

## 2023-06-30 ENCOUNTER — Other Ambulatory Visit: Payer: Self-pay | Admitting: Nurse Practitioner

## 2023-07-02 NOTE — Patient Instructions (Signed)
 Be Involved in Caring For Your Health:  Taking Medications When medications are taken as directed, they can greatly improve your health. But if they are not taken as prescribed, they may not work. In some cases, not taking them correctly can be harmful. To help ensure your treatment remains effective and safe, understand your medications and how to take them. Bring your medications to each visit for review by your provider.  Your lab results, notes, and after visit summary will be available on My Chart. We strongly encourage you to use this feature. If lab results are abnormal the clinic will contact you with the appropriate steps. If the clinic does not contact you assume the results are satisfactory. You can always view your results on My Chart. If you have questions regarding your health or results, please contact the clinic during office hours. You can also ask questions on My Chart.  We at [facility] are grateful that you chose [place] to provide your care. We strive to provide evidence-based and compassionate care and are always looking for feedback. If you get a survey from the clinic please complete this so we can hear your opinions.  Diabetes Mellitus and Foot Care Diabetes, also called diabetes mellitus, may cause problems with your feet and legs because of poor blood flow (circulation). Poor circulation may make your skin: Become thinner and drier. Break more easily. Heal more slowly. Peel and crack. You may also have nerve damage (neuropathy). This can cause decreased feeling in your legs and feet. This means that you may not notice minor injuries to your feet that could lead to more serious problems. Finding and treating problems early is the best way to prevent future foot problems. How to care for your feet Foot hygiene  Wash your feet daily with warm water and mild soap. Do not use hot water. Then, pat your feet and the areas between your toes until they are fully dry. Do  not soak your feet. This can dry your skin. Trim your toenails straight across. Do not dig under them or around the cuticle. File the edges of your nails with an emery board or nail file. Apply a moisturizing lotion or petroleum jelly to the skin on your feet and to dry, brittle toenails. Use lotion that does not contain alcohol and is unscented. Do not apply lotion between your toes. Shoes and socks Wear clean socks or stockings every day. Make sure they are not too tight. Do not wear knee-high stockings. These may decrease blood flow to your legs. Wear shoes that fit well and have enough cushioning. Always look in your shoes before you put them on to be sure there are no objects inside. To break in new shoes, wear them for just a few hours a day. This prevents injuries on your feet. Wounds, scrapes, corns, and calluses  Check your feet daily for blisters, cuts, bruises, sores, and redness. If you cannot see the bottom of your feet, use a mirror or ask someone for help. Do not cut off corns or calluses or try to remove them with medicine. If you find a minor scrape, cut, or break in the skin on your feet, keep it and the skin around it clean and dry. You may clean these areas with mild soap and water. Do not clean the area with peroxide, alcohol, or iodine. If you have a wound, scrape, corn, or callus on your foot, look at it several times a day to make sure it  is healing and not infected. Check for: Redness, swelling, or pain. Fluid or blood. Warmth. Pus or a bad smell. General tips Do not cross your legs. This may decrease blood flow to your feet. Do not use heating pads or hot water bottles on your feet. They may burn your skin. If you have lost feeling in your feet or legs, you may not know this is happening until it is too late. Protect your feet from hot and cold by wearing shoes, such as at the beach or on hot pavement. Schedule a complete foot exam at least once a year or more often if  you have foot problems. Report any cuts, sores, or bruises to your health care provider right away. Where to find more information American Diabetes Association: diabetes.org Association of Diabetes Care & Education Specialists: diabeteseducator.org Contact a health care provider if: You have a condition that increases your risk of infection, and you have any cuts, sores, or bruises on your feet. You have an injury that is not healing. You have redness on your legs or feet. You feel burning or tingling in your legs or feet. You have pain or cramps in your legs and feet. Your legs or feet are numb. Your feet always feel cold. You have pain around any toenails. Get help right away if: You have a wound, scrape, corn, or callus on your foot and: You have signs of infection. You have a fever. You have a red line going up your leg. This information is not intended to replace advice given to you by your health care provider. Make sure you discuss any questions you have with your health care provider. Document Revised: 07/28/2021 Document Reviewed: 07/28/2021 Elsevier Patient Education  2024 [employer].

## 2023-07-04 NOTE — Telephone Encounter (Signed)
 Requested medications are due for refill today.  unsure  Requested medications are on the active medications list.  yes  Last refill. 04/03/2023 - unknown quantity  Future visit scheduled.   Yes - tomorrow  Notes to clinic.  Historical medication.    Requested Prescriptions  Pending Prescriptions Disp Refills   famotidine  (PEPCID ) 20 MG tablet [Pharmacy Med Name: FAMOTIDINE  20 MG TABLET] 90 tablet     Sig: TAKE 1 TABLET BY MOUTH EVERY DAY     Gastroenterology:  H2 Antagonists Passed - 07/04/2023  3:30 PM      Passed - Valid encounter within last 12 months    Recent Outpatient Visits           3 months ago Type 2 diabetes mellitus with hyperglycemia, with long-term current use of insulin  (HCC)   [place] [facility] [place], Lavelle Posey, NP       Future Appointments             Tomorrow Cannady, Jolene T, NP [facility]

## 2023-07-05 ENCOUNTER — Encounter: Payer: Self-pay | Admitting: Nurse Practitioner

## 2023-07-05 ENCOUNTER — Telehealth: Payer: Self-pay

## 2023-07-05 ENCOUNTER — Ambulatory Visit: Payer: Commercial Managed Care - PPO | Admitting: Nurse Practitioner

## 2023-07-05 VITALS — BP 102/61 | HR 77 | Temp 98.3°F | Ht 64.0 in | Wt 203.2 lb

## 2023-07-05 DIAGNOSIS — E66812 Obesity, class 2: Secondary | ICD-10-CM

## 2023-07-05 DIAGNOSIS — E1169 Type 2 diabetes mellitus with other specified complication: Secondary | ICD-10-CM

## 2023-07-05 DIAGNOSIS — E1165 Type 2 diabetes mellitus with hyperglycemia: Secondary | ICD-10-CM

## 2023-07-05 DIAGNOSIS — Z6835 Body mass index (BMI) 35.0-35.9, adult: Secondary | ICD-10-CM

## 2023-07-05 DIAGNOSIS — F1120 Opioid dependence, uncomplicated: Secondary | ICD-10-CM

## 2023-07-05 DIAGNOSIS — I951 Orthostatic hypotension: Secondary | ICD-10-CM

## 2023-07-05 DIAGNOSIS — G620 Drug-induced polyneuropathy: Secondary | ICD-10-CM

## 2023-07-05 DIAGNOSIS — Z170 Estrogen receptor positive status [ER+]: Secondary | ICD-10-CM

## 2023-07-05 DIAGNOSIS — T451X5A Adverse effect of antineoplastic and immunosuppressive drugs, initial encounter: Secondary | ICD-10-CM

## 2023-07-05 DIAGNOSIS — G894 Chronic pain syndrome: Secondary | ICD-10-CM

## 2023-07-05 DIAGNOSIS — C50512 Malignant neoplasm of lower-outer quadrant of left female breast: Secondary | ICD-10-CM | POA: Diagnosis not present

## 2023-07-05 DIAGNOSIS — F322 Major depressive disorder, single episode, severe without psychotic features: Secondary | ICD-10-CM

## 2023-07-05 DIAGNOSIS — F19982 Other psychoactive substance use, unspecified with psychoactive substance-induced sleep disorder: Secondary | ICD-10-CM

## 2023-07-05 DIAGNOSIS — Z794 Long term (current) use of insulin: Secondary | ICD-10-CM

## 2023-07-05 DIAGNOSIS — E785 Hyperlipidemia, unspecified: Secondary | ICD-10-CM

## 2023-07-05 LAB — BAYER DCA HB A1C WAIVED: HB A1C (BAYER DCA - WAIVED): 10.3 % — ABNORMAL HIGH (ref 4.8–5.6)

## 2023-07-05 MED ORDER — DAPAGLIFLOZIN PROPANEDIOL 10 MG PO TABS: 10.0000 mg | ORAL_TABLET | Freq: Every day | ORAL | 5 refills | Status: DC

## 2023-07-05 MED ORDER — GABAPENTIN 300 MG PO CAPS: ORAL_CAPSULE | ORAL | 4 refills | Status: AC

## 2023-07-05 NOTE — Assessment & Plan Note (Signed)
 Chronic, ongoing.  Continue Rosuvastatin 40 MG daily.  Lipid panel today and adjust dose as needed.

## 2023-07-05 NOTE — Progress Notes (Unsigned)
   07/05/2023  Patient ID: Carolyn Roy, female   DOB: September 19, 1965, 58 y.o.   MRN: [id number]  Prescription for Farxiga  10mg  daily before breakfast [facility].  Obtained copay card information and provided to pharmacy: [facility] [id number] ID- [id number]  Patient's copay for Farxiga  10mg  will be  Contacted prior auth team to start PA process for Dexcom; and if approved, will provide manufacturer discount card to pharmacy to see what patient's copay would be.  Linn Rich, PharmD, DPLA

## 2023-07-05 NOTE — Progress Notes (Signed)
 BP 102/61   Pulse 77   Temp 98.3 F (36.8 C) (Oral)   Ht 5\' 4"  (1.626 m)   Wt 203 lb 3.2 oz (92.2 kg)   LMP  (LMP Unknown) Comment: LAST PERIOD IN MAY 2019 WHEN SHE STARTED CHEMO  SpO2 95%   BMI 34.88 kg/m    Subjective:    Patient ID: Carolyn Roy, female    DOB: 09/15/1965, 58 y.o.   MRN: [id number]  HPI: Carolyn Roy is a 58 y.o. female  Chief Complaint  Patient presents with   Depression   Diabetes   Gastroesophageal Reflux   Hyperlipidemia   Hypertension   DIABETES In February A1c 7.6%. Takes Metformin  XR 1000 MG BID, Januvia  100 MG (went awhile without this due to cost, about 30 days -- was able to get assistance for this), and Glargine 50 units. Trulicity  stopped in past due to GI effects and Jardiance  was too costly.  Reports A1c will be elevated due to recent loss in family, has not been eating well.  Currently is having to feed the whole family.   Hypoglycemic episodes:no Polydipsia/polyuria: no Visual disturbance: no Chest pain: no Paresthesias: no Glucose Monitoring: occasional  Accucheck frequency:   Fasting glucose:   Post prandial:   Evening:   Before meals:  Taking Insulin ?: yes  Long acting insulin : 50 units  Short acting insulin : Blood Pressure Monitoring: not checking Retinal Examination: Up To Date -- Dr. Aurea Blossom Exam: Up to Date Pneumovax: Up To Date Influenza: Up To Date Aspirin : no   HYPERLIPIDEMIA Continues Rosuvastatin  40 MG daily. Cardiology last seen in 2023 for hypotension, reassuring work-up. Quit smoking at the end of November 2024, but then started vaping.  In past did quit for 6 years, but then started back.  Started smoking at age 35, smoked >35 years total. Satisfied with current treatment? yes Duration of hyperlipidemia: chronic Cholesterol medication side effects: no Cholesterol supplements: none Aspirin : no Recent stressors: no Recurrent headaches: no Visual changes: no Palpitations: no Dyspnea:  no Chest pain: no Lower extremity edema: no Dizzy/lightheaded: no  The 10-year ASCVD risk score (Arnett DK, et al., 2019) is: 2.1%   Values used to calculate the score:     Age: 73 years     Sex: Female     Is Non-Hispanic African American: No     Diabetic: Yes     Tobacco smoker: No     Systolic Blood Pressure: 102 mmHg     Is BP treated: No     HDL Cholesterol: 52 mg/dL     Total Cholesterol: 162 mg/dL   CHRONIC PAIN  Follows with pain clinic, last visit 05/22/23.  Continues Oxycodone  and Gabapentin . Pain control status: stable Duration: chronic Location: back, knees, and hands Quality: dull, aching, and throbbing Current Pain Level: 1/10 Previous Pain Level: 10/10 Breakthrough pain: no Benefit from narcotic medications: yes What Activities task can be accomplished with current medication? Able to work and perform ADLs Interested in weaning off narcotics:no   Stool softners/OTC fiber: yes  Previous pain specialty evaluation: yes Non-narcotic analgesic meds: no Narcotic contract: yes   DEPRESSION Continues Wellbutrin  and Duloxetine .  Took Amitriptyline  in past, but this made her over sleep.  In the past she followed with Dr. Bradford Cadet, last visit was 10/25/21.  Had loss of youngest boy of family, 56 years old, passed in March 2025.  Breast cancer history, last saw oncology 03/01/23.  Was receiving infusions, every 3-6 months. She is  off injections at this time.  Does get Zometa  for bone density.  Still has port in place. Mood status: stable  Satisfied with current treatment?: yes Symptom severity: stable to mild  Duration of current treatment : chronic Side effects: no Medication compliance: good compliance Psychotherapy/counseling: none Depressed mood: yes Anxious mood: yes Anhedonia: no Significant weight loss or gain: no Insomnia: trouble staying asleep Fatigue: sometimes Feelings of worthlessness or guilt: no Impaired concentration/indecisiveness: no Suicidal  ideations: no Hopelessness: no Crying spells: yes    07/05/2023    9:13 AM 05/22/2023   11:36 AM 04/03/2023    8:30 AM 01/03/2023    8:55 AM 09/27/2022    2:32 PM  Depression screen PHQ 2/9  Decreased Interest 1 3 2 3  0  Down, Depressed, Hopeless 1 3 2 3 1   PHQ - 2 Score 2 6 4 6 1   Altered sleeping 1  0 2 2  Tired, decreased energy 1  0 2 1  Change in appetite 2  3 2  0  Feeling bad or failure about yourself  0  1 3 0  Trouble concentrating 1  1 2  0  Moving slowly or fidgety/restless 0  3 1 0  Suicidal thoughts 0  0 0 0  PHQ-9 Score 7  12 18 4   Difficult doing work/chores Somewhat difficult  Somewhat difficult Somewhat difficult Not difficult at all      07/05/2023    9:13 AM 04/03/2023    8:30 AM 01/03/2023    8:55 AM 09/27/2022    2:33 PM  GAD 7 : Generalized Anxiety Score  Nervous, Anxious, on Edge 2 3 3 1   Control/stop worrying 1 1 3 1   Worry too much - different things 1 2 2 1   Trouble relaxing 2 1 2  0  Restless 3 3 2  0  Easily annoyed or irritable 3 1 3 1   Afraid - awful might happen 3 2 3  0  Total GAD 7 Score 15 13 18 4   Anxiety Difficulty Somewhat difficult Somewhat difficult Somewhat difficult Not difficult at all   Relevant past medical, surgical, family and social history reviewed and updated as indicated. Interim medical history since our last visit reviewed. Allergies and medications reviewed and updated.  Review of Systems  Constitutional:  Negative for activity change, appetite change, fatigue and fever.  Respiratory:  Negative for cough, chest tightness and shortness of breath.   Cardiovascular:  Negative for chest pain, palpitations and leg swelling.  Gastrointestinal: Negative.   Endocrine: Negative for cold intolerance, heat intolerance, polydipsia, polyphagia and polyuria.  Genitourinary:  Negative for difficulty urinating.  Musculoskeletal:  Negative for myalgias.  Neurological: Negative.  Negative for dizziness and light-headedness.   Psychiatric/Behavioral:  Negative for decreased concentration, self-injury, sleep disturbance and suicidal ideas. The patient is nervous/anxious.    Per HPI unless specifically indicated above     Objective:    BP 102/61   Pulse 77   Temp 98.3 F (36.8 C) (Oral)   Ht 5\' 4"  (1.626 m)   Wt 203 lb 3.2 oz (92.2 kg)   LMP  (LMP Unknown) Comment: LAST PERIOD IN MAY 2019 WHEN SHE STARTED CHEMO  SpO2 95%   BMI 34.88 kg/m   Wt Readings from Last 3 Encounters:  07/05/23 203 lb 3.2 oz (92.2 kg)  05/22/23 200 lb (90.7 kg)  04/03/23 204 lb 12.8 oz (92.9 kg)    Physical Exam Vitals and nursing note reviewed.  Constitutional:      General: She  is awake. She is not in acute distress.    Appearance: Normal appearance. She is well-developed and well-groomed. She is obese. She is not ill-appearing or toxic-appearing.  HENT:     Head: Normocephalic.     Right Ear: Hearing and external ear normal.     Left Ear: Hearing and external ear normal.  Eyes:     General: Lids are normal.        Right eye: No discharge.        Left eye: No discharge.     Conjunctiva/sclera: Conjunctivae normal.     Pupils: Pupils are equal, round, and reactive to light.  Neck:     Thyroid : No thyromegaly.     Vascular: No carotid bruit.  Cardiovascular:     Rate and Rhythm: Normal rate and regular rhythm.     Heart sounds: Normal heart sounds. No murmur heard.    No gallop.  Pulmonary:     Effort: Pulmonary effort is normal. No accessory muscle usage or respiratory distress.     Breath sounds: Normal breath sounds.  Abdominal:     General: Bowel sounds are normal. There is no distension.     Palpations: Abdomen is soft.     Tenderness: There is no abdominal tenderness.  Musculoskeletal:     Cervical back: Normal range of motion and neck supple.     Right lower leg: No edema.     Left lower leg: No edema.  Lymphadenopathy:     Cervical: No cervical adenopathy.  Skin:    General: Skin is warm and dry.   Neurological:     Mental Status: She is alert and oriented to person, place, and time.     Deep Tendon Reflexes: Reflexes are normal and symmetric.     Reflex Scores:      Brachioradialis reflexes are 2+ on the right side and 2+ on the left side.      Patellar reflexes are 2+ on the right side and 2+ on the left side. Psychiatric:        Attention and Perception: Attention normal.        Mood and Affect: Mood normal.        Speech: Speech normal.        Behavior: Behavior normal. Behavior is cooperative.        Thought Content: Thought content normal.    Results for orders placed or performed in visit on 07/05/23  Bayer DCA Hb A1c Waived   Collection Time: 07/05/23  9:15 AM  Result Value Ref Range   HB A1C (BAYER DCA - WAIVED) 10.3 (H) 4.8 - 5.6 %      Assessment & Plan:   Problem List Items Addressed This Visit       Cardiovascular and Mediastinum   Hypotension   Ongoing, stable. Recommend to continue to take plenty of water intake daily and add some salt to diet + wear compression on during day and off at night. Has seen cardiology with overall stable work-up. Would avoid HTN medications.        Endocrine   Type 2 diabetes mellitus with hyperglycemia, with long-term current use of insulin  (HCC) - Primary   Chronic, ongoing.  A1c 10.3% today, prior was 7.6%, trending up to loss in family and poor diet.  Feeding whole family and healthier items costly.  Eye exam and foot exam up to date.  Cannot take Jardiance  due to cost. Trulicity  caused GI issues.   - Continue Lantus   but increase to 55 units and back down to 40 units if too many lows <70 with this change. Will see if can get assistance with Jardiance  or Farxiga , reached out to [facility].  If cannot then may need to add on meal time insulin , which discussed with patient.  Will also see if Bartholomew Light can assist with Dexcom or Freestyle. -  Will continue Metformin  and Januvia . May benefit stopping DPP4 in future and retrial of Ozempic   if cost effective and covered.   -  Plan on PharmD referral if issues getting medications -  Recommend monitor BS at home TID and heavily focus on diet changes.  -  Urine ALB 150 February 2025, can not take ACE/ARB due to hypotension.  Statin on board.   -  Return in 3 months for A1c check.  Will have return to endo if note ongoing poor control.      Relevant Orders   Bayer DCA Hb A1c Waived (Completed)   Hyperlipidemia associated with type 2 diabetes mellitus (HCC)   Chronic, ongoing.  Continue Rosuvastatin  40 MG daily.  Lipid panel today and adjust dose as needed.      Relevant Orders   Bayer DCA Hb A1c Waived (Completed)   Comprehensive metabolic panel with GFR   Lipid Panel w/o Chol/HDL Ratio     Nervous and Auditory   Chemotherapy-induced neuropathy (HCC) (Chronic)   Chronic, ongoing.  Continue current pain management collaboration and medication regimen as prescribed by them. Continue Gabapentin  300 MG in morning and 600 MG in the evening, to offer more benefit to neuropathy.  Recent eGFR stable.      Relevant Medications   gabapentin  (NEURONTIN ) 300 MG capsule     Other   Uncomplicated opioid dependence (HCC) (Chronic)   Chronic, stable.  Followed by pain management, continue this collaboration, recent notes reviewed.      Chronic pain syndrome (Chronic)   Chronic, stable.  Followed by pain management, continue this collaboration, recent notes reviewed.      Relevant Medications   gabapentin  (NEURONTIN ) 300 MG capsule   Severe depression (HCC)   Chronic, ongoing. Currently denies SI/HI. Taking Duloxetine , continue this, plus finding benefit from Wellbutrin  XL.  Could consider increase in Duloxetine  or adding on Buspar in future for mood, which discussed with her today and educated.  Recommend she schedule f/u with Dr. Bradford Cadet as needed in future if worsening mood.  At this time she reports being stable, going through grieving process.      Obesity   BMI 34.88, with  T2DM, HTN, HLD  Recommended eating smaller high protein, low fat meals more frequently and exercising 30 mins a day 5 times a week with a goal of 10-15lb weight loss in the next 3 months. Patient voiced their understanding and motivation to adhere to these recommendations.       Malignant neoplasm of lower-outer quadrant of left breast of female, estrogen receptor positive (HCC)   Ongoing, stable.  Continue collaboration with oncology, recent note reviewed.      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  Will continue Gabapentin , other medications caused too much fatigue.  Benefit to sleep and to migraines and chronic pain.          Follow up plan: Return in about 5 years (around 07/04/2028) for T2DM, medication changes.

## 2023-07-05 NOTE — Assessment & Plan Note (Signed)
 Chronic, ongoing.  Continue current pain management collaboration and medication regimen as prescribed by them. Continue Gabapentin  300 MG in morning and 600 MG in the evening, to offer more benefit to neuropathy.  Recent eGFR stable.

## 2023-07-05 NOTE — Assessment & Plan Note (Signed)
 Ongoing, stable.  Continue collaboration with oncology, recent note reviewed.

## 2023-07-05 NOTE — Assessment & Plan Note (Addendum)
 Chronic, ongoing. Currently denies SI/HI. Taking Duloxetine , continue this, plus finding benefit from Wellbutrin  XL.  Could consider increase in Duloxetine  or adding on Buspar in future for mood, which discussed with her today and educated.  Recommend she schedule f/u with Dr. Bradford Cadet as needed in future if worsening mood.  At this time she reports being stable, going through grieving process.

## 2023-07-05 NOTE — Assessment & Plan Note (Signed)
 BMI 34.88, with T2DM, HTN, HLD  Recommended eating smaller high protein, low fat meals more frequently and exercising 30 mins a day 5 times a week with a goal of 10-15lb weight loss in the next 3 months. Patient voiced their understanding and motivation to adhere to these recommendations.

## 2023-07-05 NOTE — Assessment & Plan Note (Signed)
 Ongoing, stable. Recommend to continue to take plenty of water intake daily and add some salt to diet + wear compression on during day and off at night. Has seen cardiology with overall stable work-up. Would avoid HTN medications.

## 2023-07-05 NOTE — Assessment & Plan Note (Signed)
 Chronic, stable.  Followed by pain management, continue this collaboration, recent notes reviewed.

## 2023-07-05 NOTE — Addendum Note (Signed)
 Addended by: Atilano Covelli T on: 07/05/2023 04:35 PM   Modules accepted: Orders

## 2023-07-05 NOTE — Assessment & Plan Note (Signed)
 Chronic, stable.  Will continue Gabapentin , other medications caused too much fatigue.  Benefit to sleep and to migraines and chronic pain.

## 2023-07-05 NOTE — Assessment & Plan Note (Signed)
 Chronic, ongoing.  A1c 10.3% today, prior was 7.6%, trending up to loss in family and poor diet.  Feeding whole family and healthier items costly.  Eye exam and foot exam up to date.  Cannot take Jardiance  due to cost. Trulicity  caused GI issues.   - Continue Lantus  but increase to 55 units and back down to 40 units if too many lows <70 with this change. Will see if can get assistance with Jardiance  or Farxiga , reached out to [place].  If cannot then may need to add on meal time insulin , which discussed with patient.  Will also see if Bartholomew Light can assist with Dexcom or Freestyle. -  Will continue Metformin  and Januvia . May benefit stopping DPP4 in future and retrial of Ozempic  if cost effective and covered.   -  Plan on PharmD referral if issues getting medications -  Recommend monitor BS at home TID and heavily focus on diet changes.  -  Urine ALB 150 February 2025, can not take ACE/ARB due to hypotension.  Statin on board.   -  Return in 3 months for A1c check.  Will have return to endo if note ongoing poor control.

## 2023-07-05 NOTE — Progress Notes (Signed)
   07/05/2023  Patient ID: Lise Ridge, female   DOB: 18-Dec-1965, 58 y.o.   MRN: [id number]  Clinic routed request from patient's PCP, Jolene Cannady, NP, to inquire about coverage and affordability of either Jardiance  or Farxiga  and either Dexcom or Libre.  Patient's A1c recently elevated at 10.%, up from 7.6%.  Contacted patient's plan, and Farxiga  covered by plan without a prior authorization; and her copay would be approximately $190/month .  There is a manufacturer copay card available that would cover $175 of this copay, making the patient's responsibility $15.Jardiance  is also covered without a prior authorization; and her copay would be approximately $201/month.  There is a manufacturer copay card available that would cover $175 of this copay, making the patient's responsibility $26.  Dexcom G7 sensors are covered but require a PA; if approved, patient would pay 20% of cost (approximately $96/month).  There is also a manufacture coupon that may bring patient's responsibility down, but it is unclear exactly how much.  Libre not covered on plan.  Sending information to PCP; and if she would like to prescribe, I will obtain copay cards for the patient and provider to the pharmacy.  Follow-up already scheduled with patient for 6/19.  Linn Rich, PharmD, DPLA

## 2023-07-06 ENCOUNTER — Telehealth: Payer: Self-pay

## 2023-07-06 ENCOUNTER — Other Ambulatory Visit (HOSPITAL_COMMUNITY): Payer: Self-pay

## 2023-07-06 ENCOUNTER — Encounter: Payer: Self-pay | Admitting: Internal Medicine

## 2023-07-06 ENCOUNTER — Ambulatory Visit: Payer: Self-pay | Admitting: Nurse Practitioner

## 2023-07-06 LAB — LIPID PANEL W/O CHOL/HDL RATIO
Cholesterol, Total: 113 mg/dL (ref 100–199)
HDL: 49 mg/dL (ref >39)
LDL Chol Calc (NIH): 47 mg/dL (ref 0–99)
Triglycerides: 87 mg/dL (ref 0–149)
VLDL Cholesterol Cal: 17 mg/dL (ref 5–40)

## 2023-07-06 LAB — COMPREHENSIVE METABOLIC PANEL WITH GFR
ALT: 17 IU/L (ref 0–32)
AST: 16 IU/L (ref 0–40)
Albumin: 3.8 g/dL (ref 3.8–4.9)
Alkaline Phosphatase: 101 IU/L (ref 44–121)
BUN/Creatinine Ratio: 16 (ref 9–23)
BUN: 13 mg/dL (ref 6–24)
Bilirubin Total: 0.3 mg/dL (ref 0.0–1.2)
CO2: 23 mmol/L (ref 20–29)
Calcium: 9.2 mg/dL (ref 8.7–10.2)
Chloride: 102 mmol/L (ref 96–106)
Creatinine, Ser: 0.81 mg/dL (ref 0.57–1.00)
Globulin, Total: 2.1 g/dL (ref 1.5–4.5)
Glucose: 347 mg/dL — ABNORMAL HIGH (ref 70–99)
Potassium: 4.4 mmol/L (ref 3.5–5.2)
Sodium: 139 mmol/L (ref 134–144)
Total Protein: 5.9 g/dL — ABNORMAL LOW (ref 6.0–8.5)
eGFR: 86 mL/min/{1.73_m2} (ref >59)

## 2023-07-06 NOTE — Progress Notes (Signed)
 Contacted via MyChart   Good morning Lateshia, your labs have returned: - Glucose (sugar) definitely elevated as expected.  I know Bartholomew Light is assisting to get Farxiga  at lower cost, we will see if this offers benefit.  Check sugars every morning and 2 hours after a meal. - Kidney function, creatinine and eGFR, remains normal, as is liver function, AST and ALT.   - Lipid panel shows normal levels.  Any questions? Keep being amazing!!  Thank you for allowing me to participate in your care.  I appreciate you. Kindest regards, Dessa Ledee

## 2023-07-06 NOTE — Telephone Encounter (Signed)
 Looks like both CGM's are currently covered (Dexcom and [employer]). Dexcom sensors are showing as $135.57, whereas Freestyle [name] may be more cost effective with a manufacturer coupon ($171.99 WITHOUT coupon, but $86.63 WITH coupon)

## 2023-07-06 NOTE — Telephone Encounter (Signed)
 Apologies, looks like [facility] was automatically adding coupons, Dexcom is preferred, but doesn't require PA (as far as I can tell), but Freestyle may still be cheaper with manufacturer coupon.

## 2023-07-06 NOTE — Telephone Encounter (Signed)
  Per test claim:  both CGM's are currently covered (Dexcom and Freestyle [place]). Dexcom sensors are showing as $135.57, whereas Freestyle [name] may be more cost effective with a manufacturer coupon ($171.99 WITHOUT coupon, but $86.63 WITH coupon)

## 2023-07-07 ENCOUNTER — Telehealth: Payer: Self-pay

## 2023-07-07 ENCOUNTER — Other Ambulatory Visit: Payer: Self-pay | Admitting: Nurse Practitioner

## 2023-07-07 MED ORDER — DEXCOM G7 SENSOR MISC: 11 refills | Status: DC

## 2023-07-07 NOTE — Progress Notes (Signed)
   07/07/2023  Patient ID: Carolyn Roy, female   DOB: 04-Dec-1965, 58 y.o.   MRN: [id number]  Prior authorization team note reflects PA not required for Dexcom G7 sensors or Libre.  [name] would be $86.63 with insurance and manufacturer coupon but unsure what Dexcom would be with insurance and coupon.  Sending prescription for Dexcom G7 sensors along with manufacturer coupon information to [facility] under [facility] standing order for [facility].  [facility], and patient's copay for Dexcom G7 sensors on insurance would be $135.57.  Dexcom discount card will not allow for dual billing since patient's insurance is covering the claim.  Prescription put on hold, and I will discuss with patient at our upcoming follow-up visit in June.  Linn Rich, PharmD, DPLA

## 2023-07-07 NOTE — Telephone Encounter (Signed)
 Requested medication (s) are due for refill today: No  Requested medication (s) are on the active medication list: Yes  Last refill:  07/07/23  Future visit scheduled: Yes  Notes to clinic:  See pharmacy request.    Requested Prescriptions  Pending Prescriptions Disp Refills   Continuous Glucose Sensor (DEXCOM G7 SENSOR) MISC [Pharmacy Med Name: DEXCOM G7 SENSOR] 3 each 11    Sig: CHANGE SENSOR EVERY 3 DAYS     There is no refill protocol information for this order

## 2023-07-18 ENCOUNTER — Encounter: Payer: Self-pay | Admitting: Nurse Practitioner

## 2023-07-18 ENCOUNTER — Ambulatory Visit
Admission: RE | Admit: 2023-07-18 | Discharge: 2023-07-18 | Disposition: A | Discharge status: Home / Self Care | Payer: Commercial Managed Care - PPO | Source: Ambulatory Visit | Attending: Internal Medicine | Admitting: Internal Medicine

## 2023-07-18 DIAGNOSIS — C50512 Malignant neoplasm of lower-outer quadrant of left female breast: Secondary | ICD-10-CM | POA: Diagnosis present

## 2023-07-18 DIAGNOSIS — Z170 Estrogen receptor positive status [ER+]: Secondary | ICD-10-CM | POA: Insufficient documentation

## 2023-07-18 MED ORDER — BUSPIRONE HCL 5 MG PO TABS: 5.0000 mg | ORAL_TABLET | Freq: Two times a day (BID) | ORAL | 1 refills | Status: DC

## 2023-07-27 ENCOUNTER — Other Ambulatory Visit: Payer: Self-pay

## 2023-07-27 MED ORDER — SITAGLIPTIN PHOSPHATE 100 MG PO TABS: 100.0000 mg | ORAL_TABLET | Freq: Every day | ORAL | 2 refills | Status: DC

## 2023-07-27 NOTE — Progress Notes (Unsigned)
   07/27/2023  Patient ID: Carolyn Roy, female   DOB: 11-14-1965, 58 y.o.   MRN: [id number]  Subjective/Objective  Diabetes: Current medications: metformin  1000mg  BID, Lantus  60 units daily, Farxiga  10mg  daily Medications tried in the past: did not tolerate GLP1 medications well previously -Patient has been out of Januvia  100mg  2-3 weeks, and pharmacy needs additional refills to be able to fill -Last A1c 10.3%, up from 7.6% -[facility], and patient's copay for Dexcom G7 sensors on insurance would be $135.57 for a 1 month supply.  One month supply of Libre 3+ sensors would be $75. -Patient does monitor home BG and endorses FBG averages 110, post-prandial 200 -Does endorse occasional insurance of nocturnal hypoglycemia where dog will actually wake her up and legs feel like jello.  Resolved with drinking some orange juice and states this usually occurs nights when she does not eat an actual supper, only nibbles on food.  Assessment/Plan:    Diabetes: -Currently uncontrolled -Prescription pending for Januvia  100mg  refills to go to patient's pharmacy -Patient takes Lantus  at bedtime, so I suggested taking 4-5 units less evenings she does not eat supper to try to help prevent nocturnal hypoglycemia -[facility] [employer] 3[facility]; will inform patient of this and copay; if she does not wish to pay $75/month, she can have the pharmacy place the prescription on hold -A1c due again in August  Follow-up:  6 weeks to check in on BG   Linn Rich, PharmD, DPLA

## 2023-07-31 NOTE — Addendum Note (Signed)
 Addended by: DEANNA ROSELLA A on: 07/31/2023 08:19 AM   Modules accepted: Orders

## 2023-08-09 ENCOUNTER — Other Ambulatory Visit: Payer: Self-pay

## 2023-08-12 NOTE — Patient Instructions (Signed)
 Be Involved in Caring For Your Health:  Taking Medications When medications are taken as directed, they can greatly improve your health. But if they are not taken as prescribed, they may not work. In some cases, not taking them correctly can be harmful. To help ensure your treatment remains effective and safe, understand your medications and how to take them. Bring your medications to each visit for review by your provider.  Your lab results, notes, and after visit summary will be available on My Chart. We strongly encourage you to use this feature. If lab results are abnormal the clinic will contact you with the appropriate steps. If the clinic does not contact you assume the results are satisfactory. You can always view your results on My Chart. If you have questions regarding your health or results, please contact the clinic during office hours. You can also ask questions on My Chart.  We at [facility] are grateful that you chose us  to provide your care. We strive to provide evidence-based and compassionate care and are always looking for feedback. If you get a survey from the clinic please complete this so we can hear your opinions.  Diabetes Action Plan A diabetes action plan is a way for you to manage your symptoms of diabetes, also called diabetes mellitus. The plan is color-coded to guide you on what actions to take based on any symptoms you're having. If you have symptoms in the red zone, you need medical care right away. If you have symptoms in the yellow zone, your diabetes isn't under control, and you may need to make some changes. If you have symptoms in the green zone, you're doing well. Understanding diabetes can take time. Follow the treatment plan that you created with your health care provider. Know the target range for your blood sugar, also called glucose. Review your plan each time you visit your provider. The target range for my blood sugar level is  __________________________ mg/dL. Red zone Get medical help right away if you have any of the following symptoms: A blood sugar test result that's below 54 mg/dL (3 mmol/L). A blood sugar test result that's at or above 240 mg/dL (86.6 mmol/L) for 2 days in a row along with: Extreme thirst and frequent peeing. Confusion or trouble thinking clearly. Moderate or large ketone levels in your pee (urine). Feeling tired or having no energy. Trouble breathing. Sickness or a fever for 2 or more days that's not getting better. These symptoms may be an emergency. Call 911 right away. Do not wait to see if the symptoms will go away. Do not drive yourself to the hospital. If you have very low blood sugar, also called severe hypoglycemia, and you can't eat or drink, you may need glucagon. Make sure a family member or close friend knows how to check your blood sugar and how to give you glucagon. You may need to be treated in a hospital for this condition. Yellow zone If you have any of the following symptoms, your diabetes isn't under control, and you may need to make some changes: A blood sugar test result that's at or above 240 mg/dL (86.6 mmol/L) for 2 days in a row. Blood sugar test results that are below 70 mg/dL (3.9 mmol/L). Other symptoms of hypoglycemia, such as: Shaking or feeling light-headed. Confusion or irritability. Feeling hungry. Having a fast heartbeat. If you have any yellow zone symptoms: Treat your hypoglycemia by eating or drinking 15 grams of a rapid-acting carbohydrate. Follow the  15:15 rule: Take 15 grams of a rapid-acting carbohydrate, such as: 1 tube of glucose gel. 4 glucose pills. 4 oz (120 mL) of fruit juice. 4 oz (120 mL) of regular (not diet) soda. Check your blood sugar again 15 minutes after you take the carbohydrate. If the second blood sugar test is still at or below 70 mg/dL (3.9 mmol/L), take 15 grams of a carbohydrate again. If your blood sugar doesn't  increase above 70 mg/dL (3.9 mmol/L) after 3 tries, get medical help right away. After your blood sugar returns to normal, eat a meal or a snack within 1 hour. Keep taking your daily medicines as told by your provider. Check your blood sugar more often than you normally would. Write down your results. Call your provider if you have trouble keeping your blood sugar in your target range. Green zone These signs mean you're doing well and can continue what you're doing to manage your diabetes: Your blood sugar is within your personal target range. For most people, a blood sugar level before a meal should be 80-130 mg/dL (4.4-7.2 mmol/L). You feel well, and you're able to do daily activities. If you're in the green zone, continue to manage your diabetes as told by your provider. To do this: Eat a healthy diet. Exercise regularly. Check your blood sugar as told. Take your medicines only as told. Where to find more information American Diabetes Association (ADA): diabetes.org Association of Diabetes Care & Education Specialists (ADCES): adces.org/diabetes-education-dsmes This information is not intended to replace advice given to you by your health care provider. Make sure you discuss any questions you have with your health care provider. Document Revised: 09/14/2022 Document Reviewed: 09/14/2022 Elsevier Patient Education  2024 [employer].

## 2023-08-15 ENCOUNTER — Ambulatory Visit: Admitting: Nurse Practitioner

## 2023-08-15 ENCOUNTER — Encounter: Payer: Self-pay | Admitting: Nurse Practitioner

## 2023-08-15 VITALS — BP 107/68 | HR 85 | Temp 98.0°F | Ht 64.0 in | Wt 195.2 lb

## 2023-08-15 DIAGNOSIS — E1165 Type 2 diabetes mellitus with hyperglycemia: Secondary | ICD-10-CM

## 2023-08-15 DIAGNOSIS — Z794 Long term (current) use of insulin: Secondary | ICD-10-CM

## 2023-08-15 NOTE — Progress Notes (Signed)
 BP 107/68   Pulse 85   Temp 98 F (36.7 C) (Oral)   Ht 5' 4 (1.626 m)   Wt 195 lb 3.2 oz (88.5 kg)   LMP  (LMP Unknown) Comment: LAST PERIOD IN MAY 2019 WHEN SHE STARTED CHEMO  SpO2 97%   BMI 33.51 kg/m    Subjective:    Patient ID: Carolyn Roy, female    DOB: 21-Dec-1965, 58 y.o.   MRN: [id number]  HPI: Carolyn Roy is a 58 y.o. female  Chief Complaint  Patient presents with   Diabetes   DIABETES Follow-up today for diabetes.  Her A1c on 07/05/23 was 10.3%, which was trend up.  Started Januvia  on 07/27/23.  Working with Channing PharmD with [facility] on assistance needs.  Gets assistance with Farxiga  and Januvia .  Continues on Lantus  60 units daily, which was increased in May, + Metformin .  Using Freestyle: over past 30 days time in range has been 68%, over total of 27% of the time (8% greater than 250), 5 % low.  Average glucose was 148. On review she spikes at dinner time, 6 pm. Hypoglycemic episodes: as above, minimal Polydipsia/polyuria: no Visual disturbance: no Chest pain: no Paresthesias: no Glucose Monitoring: yes  Accucheck frequency: as above  Fasting glucose:  Post prandial:  Evening:  Before meals: Taking Insulin ?: yes  Long acting insulin : 60 units  Short acting insulin : Blood Pressure Monitoring: not checking Retinal Examination: Up to Date Foot Exam: Up to Date Diabetic Education: Not Completed Pneumovax: Up to Date Influenza: Up to Date Aspirin : no   Relevant past medical, surgical, family and social history reviewed and updated as indicated. Interim medical history since our last visit reviewed. Allergies and medications reviewed and updated.  Review of Systems  Constitutional:  Negative for activity change, appetite change, fatigue and fever.  Respiratory:  Negative for cough, chest tightness and shortness of breath.   Cardiovascular:  Negative for chest pain, palpitations and leg swelling.  Gastrointestinal: Negative.   Endocrine:  Negative for cold intolerance, heat intolerance, polydipsia, polyphagia and polyuria.  Genitourinary:  Negative for difficulty urinating.  Musculoskeletal:  Negative for myalgias.  Neurological: Negative.  Negative for dizziness and light-headedness.  Psychiatric/Behavioral:  Negative for decreased concentration, self-injury, sleep disturbance and suicidal ideas. The patient is nervous/anxious.     Per HPI unless specifically indicated above     Objective:    BP 107/68   Pulse 85   Temp 98 F (36.7 C) (Oral)   Ht 5' 4 (1.626 m)   Wt 195 lb 3.2 oz (88.5 kg)   LMP  (LMP Unknown) Comment: LAST PERIOD IN MAY 2019 WHEN SHE STARTED CHEMO  SpO2 97%   BMI 33.51 kg/m   Wt Readings from Last 3 Encounters:  08/15/23 195 lb 3.2 oz (88.5 kg)  07/05/23 203 lb 3.2 oz (92.2 kg)  05/22/23 200 lb (90.7 kg)    Physical Exam Vitals and nursing note reviewed.  Constitutional:      General: She is awake. She is not in acute distress.    Appearance: Normal appearance. She is well-developed and well-groomed. She is obese. She is not ill-appearing or toxic-appearing.  HENT:     Head: Normocephalic.     Right Ear: Hearing and external ear normal.     Left Ear: Hearing and external ear normal.  Eyes:     General: Lids are normal.        Right eye: No discharge.  Left eye: No discharge.     Conjunctiva/sclera: Conjunctivae normal.     Pupils: Pupils are equal, round, and reactive to light.  Neck:     Thyroid : No thyromegaly.     Vascular: No carotid bruit.  Cardiovascular:     Rate and Rhythm: Normal rate and regular rhythm.     Heart sounds: Normal heart sounds. No murmur heard.    No gallop.  Pulmonary:     Effort: Pulmonary effort is normal. No accessory muscle usage or respiratory distress.     Breath sounds: Normal breath sounds.  Abdominal:     General: Bowel sounds are normal. There is no distension.     Palpations: Abdomen is soft.     Tenderness: There is no abdominal  tenderness.  Musculoskeletal:     Cervical back: Normal range of motion and neck supple.     Right lower leg: No edema.     Left lower leg: No edema.  Lymphadenopathy:     Cervical: No cervical adenopathy.  Skin:    General: Skin is warm and dry.  Neurological:     Mental Status: She is alert and oriented to person, place, and time.     Deep Tendon Reflexes: Reflexes are normal and symmetric.     Reflex Scores:      Brachioradialis reflexes are 2+ on the right side and 2+ on the left side.      Patellar reflexes are 2+ on the right side and 2+ on the left side. Psychiatric:        Attention and Perception: Attention normal.        Mood and Affect: Mood normal.        Speech: Speech normal.        Behavior: Behavior normal. Behavior is cooperative.        Thought Content: Thought content normal.     Results for orders placed or performed in visit on 07/05/23  Bayer DCA Hb A1c Waived   Collection Time: 07/05/23  9:15 AM  Result Value Ref Range   HB A1C (BAYER DCA - WAIVED) 10.3 (H) 4.8 - 5.6 %  Comprehensive metabolic panel with GFR   Collection Time: 07/05/23  9:15 AM  Result Value Ref Range   Glucose 347 (H) 70 - 99 mg/dL   BUN 13 6 - 24 mg/dL   Creatinine, Ser 9.18 0.57 - 1.00 mg/dL   eGFR 86 >40 fO/fpw/8.26   BUN/Creatinine Ratio 16 9 - 23   Sodium 139 134 - 144 mmol/L   Potassium 4.4 3.5 - 5.2 mmol/L   Chloride 102 96 - 106 mmol/L   CO2 23 20 - 29 mmol/L   Calcium  9.2 8.7 - 10.2 mg/dL   Total Protein 5.9 (L) 6.0 - 8.5 g/dL   Albumin 3.8 3.8 - 4.9 g/dL   Globulin, Total 2.1 1.5 - 4.5 g/dL   Bilirubin Total 0.3 0.0 - 1.2 mg/dL   Alkaline Phosphatase 101 44 - 121 IU/L   AST 16 0 - 40 IU/L   ALT 17 0 - 32 IU/L  Lipid Panel w/o Chol/HDL Ratio   Collection Time: 07/05/23  9:15 AM  Result Value Ref Range   Cholesterol, Total 113 100 - 199 mg/dL   Triglycerides 87 0 - 149 mg/dL   HDL 49 >60 mg/dL   VLDL Cholesterol Cal 17 5 - 40 mg/dL   LDL Chol Calc (NIH) 47 0 - 99  mg/dL      Assessment & Plan:  Problem List Items Addressed This Visit       Endocrine   Type 2 diabetes mellitus with hyperglycemia, with long-term current use of insulin  (HCC) - Primary   Chronic, ongoing.  A1c 10.3% at end of May, trending up due to loss of son and poor diet.  Feeding whole family and healthier items costly.  Eye exam and foot exam up to date.  Getting assistance now with Januvia  and Farxiga ,  Appreciate Channing PharmD with [facility] for her assistance. Sugars are trending down with changes, noted on her Freestyle. - Continue Lantus  60 units and back down to 55 units if too many lows <70 with this change.  -  Will continue Metformin  at current dosing. -  Recommend monitor BS at home TID and heavily focus on diet changes.  -  Urine ALB 150 February 2025, can not take ACE/ARB due to hypotension.  Statin on board.   -  Return at end of August for A1c check.  Will have return to endo if note ongoing poor control.        Follow up plan: Return in about 7 weeks (around 10/06/2023) for T2DM, HTN/HLD, MOOD.

## 2023-08-15 NOTE — Assessment & Plan Note (Signed)
 Chronic, ongoing.  A1c 10.3% at end of May, trending up due to loss of son and poor diet.  Feeding whole family and healthier items costly.  Eye exam and foot exam up to date.  Getting assistance now with Januvia  and Farxiga ,  Appreciate Channing PharmD with [facility] for her assistance. Sugars are trending down with changes, noted on her Freestyle. - Continue Lantus  60 units and back down to 55 units if too many lows <70 with this change.  -  Will continue Metformin  at current dosing. -  Recommend monitor BS at home TID and heavily focus on diet changes.  -  Urine ALB 150 February 2025, can not take ACE/ARB due to hypotension.  Statin on board.   -  Return at end of August for A1c check.  Will have return to endo if note ongoing poor control.

## 2023-08-21 ENCOUNTER — Encounter: Payer: Self-pay | Admitting: Nurse Practitioner

## 2023-08-21 ENCOUNTER — Ambulatory Visit: Attending: Pain Medicine | Admitting: Nurse Practitioner

## 2023-08-21 DIAGNOSIS — M47819 Spondylosis without myelopathy or radiculopathy, site unspecified: Secondary | ICD-10-CM | POA: Insufficient documentation

## 2023-08-21 DIAGNOSIS — Z79891 Long term (current) use of opiate analgesic: Secondary | ICD-10-CM | POA: Diagnosis present

## 2023-08-21 DIAGNOSIS — M25562 Pain in left knee: Secondary | ICD-10-CM | POA: Insufficient documentation

## 2023-08-21 DIAGNOSIS — M79641 Pain in right hand: Secondary | ICD-10-CM | POA: Insufficient documentation

## 2023-08-21 DIAGNOSIS — G8929 Other chronic pain: Secondary | ICD-10-CM | POA: Diagnosis present

## 2023-08-21 DIAGNOSIS — R937 Abnormal findings on diagnostic imaging of other parts of musculoskeletal system: Secondary | ICD-10-CM | POA: Diagnosis present

## 2023-08-21 DIAGNOSIS — Z79899 Other long term (current) drug therapy: Secondary | ICD-10-CM | POA: Diagnosis present

## 2023-08-21 DIAGNOSIS — G894 Chronic pain syndrome: Secondary | ICD-10-CM | POA: Insufficient documentation

## 2023-08-21 DIAGNOSIS — M545 Low back pain, unspecified: Secondary | ICD-10-CM | POA: Diagnosis present

## 2023-08-21 DIAGNOSIS — M2540 Effusion, unspecified joint: Secondary | ICD-10-CM | POA: Diagnosis present

## 2023-08-21 DIAGNOSIS — M25552 Pain in left hip: Secondary | ICD-10-CM | POA: Diagnosis present

## 2023-08-21 DIAGNOSIS — M79642 Pain in left hand: Secondary | ICD-10-CM | POA: Diagnosis present

## 2023-08-21 DIAGNOSIS — M542 Cervicalgia: Secondary | ICD-10-CM | POA: Insufficient documentation

## 2023-08-21 DIAGNOSIS — M47816 Spondylosis without myelopathy or radiculopathy, lumbar region: Secondary | ICD-10-CM | POA: Diagnosis present

## 2023-08-21 DIAGNOSIS — M25561 Pain in right knee: Secondary | ICD-10-CM | POA: Diagnosis present

## 2023-08-21 DIAGNOSIS — M4316 Spondylolisthesis, lumbar region: Secondary | ICD-10-CM | POA: Diagnosis present

## 2023-08-21 DIAGNOSIS — M25551 Pain in right hip: Secondary | ICD-10-CM | POA: Diagnosis present

## 2023-08-21 DIAGNOSIS — M79672 Pain in left foot: Secondary | ICD-10-CM | POA: Diagnosis present

## 2023-08-21 DIAGNOSIS — G893 Neoplasm related pain (acute) (chronic): Secondary | ICD-10-CM | POA: Insufficient documentation

## 2023-08-21 DIAGNOSIS — M79671 Pain in right foot: Secondary | ICD-10-CM | POA: Insufficient documentation

## 2023-08-21 MED ORDER — OXYCODONE HCL 5 MG PO TABS: 5.0000 mg | ORAL_TABLET | Freq: Two times a day (BID) | ORAL | 0 refills | Status: DC | PRN

## 2023-08-21 MED ORDER — OXYCODONE HCL 5 MG PO TABS
5.0000 mg | ORAL_TABLET | Freq: Two times a day (BID) | ORAL | 0 refills | Status: DC | PRN
Start: 2023-11-02 — End: 2023-11-20

## 2023-08-21 NOTE — Progress Notes (Signed)
 Nursing Pain Medication Assessment:  Safety precautions to be maintained throughout the outpatient stay will include: orient to surroundings, keep bed in low position, maintain call bell within reach at all times, provide assistance with transfer out of bed and ambulation.  Medication Inspection Compliance: Pill count conducted under aseptic conditions, in front of the patient. Neither the pills nor the bottle was removed from the patient's sight at any time. Once count was completed pills were immediately returned to the patient in their original bottle.  Medication: Oxycodone  IR Pill/Patch Count: 29 of 60 pills/patches remain Pill/Patch Appearance: Markings consistent with prescribed medication Bottle Appearance: Standard pharmacy container. Clearly labeled. Filled Date: 06 / 27/ 2025 Last Medication intake:  Today

## 2023-08-21 NOTE — Progress Notes (Signed)
 PROVIDER NOTE: Interpretation of information contained herein should be left to medically-trained personnel. Specific patient instructions are provided elsewhere under Patient Instructions section of medical record. This document was created in part using AI and STT-dictation technology, any transcriptional errors that may result from this process are unintentional.  Patient: Carolyn Roy  Service: E/M   PCP: Valerio Melanie DASEN, NP  DOB: 08/28/1965  DOS: 08/21/2023  Provider: Emmy MARLA Blanch, NP  MRN: [id number]  Delivery: Face-to-face  Specialty: Interventional Pain Management  Type: Established Patient  Setting: Ambulatory outpatient facility  Specialty designation: 09  Referring Prov.: Valerio Melanie DASEN, NP  Location: Outpatient office facility       History of present illness (HPI) [name], a 58 y.o. year old female, is here today because of her No primary diagnosis found.. Ms. Ludwick primary complain today is Knee Pain (Right )  Pertinent problems: Ms. Mandarino has chronic fatigue; family history of breast cancer; chronic feet pain (1ry area of pain) (bilateral) (R>L); neuropathic pain of feet (bilateral); osteoarthritis of knee (right); chronic low back pain (bilateral) (L>R) w/o sciatica, and chronic pain syndrome on their pertinent problem list  Pain Assessment: Severity of Chronic pain is reported as a 2 /10. Location: Knee Left/sometimes up and down the leg from the knee. Onset: More than a month ago. Quality: Dull, Nagging, Discomfort. Timing: Intermittent. Modifying factor(s): ice/heat, ASA cream, medications. Vitals:  height is 5' 4 (1.626 m) and weight is 192 lb (87.1 kg). Her temporal temperature is 97.4 F (36.3 C) (abnormal). Her blood pressure is 120/84 and her pulse is 94. Her respiration is 16 and oxygen saturation is 100%.  BMI: Estimated body mass index is 32.96 kg/m as calculated from the following:   Height as of this encounter: 5' 4 (1.626 m).   Weight  as of this encounter: 192 lb (87.1 kg).  Last encounter: 05/22/2023 Last procedure: Visit date not found.  Reason for encounter: medication management.  The patient indicates doing well with current medication regimen.  No adverse reaction or side effects reported to medication.  She is experiencing chronic knee pain on her right knee.  She is currently managing her pain with the medication and does not require any injection at this time. Pharmacotherapy Assessment   Oxycodone  (Oxy IR/Roxiecodone) 5 mg immediate release tablet 2 times daily as needed for pain. MME=15 Monitoring: [place] PMP: PDMP reviewed during this encounter.       Pharmacotherapy: No side-effects or adverse reactions reported. Compliance: No problems identified. Effectiveness: Clinically acceptable.  Jakie Chrissie MATSU, RN  08/21/2023 12:00 PM  Sign when Signing Visit Nursing Pain Medication Assessment:  Safety precautions to be maintained throughout the outpatient stay will include: orient to surroundings, keep bed in low position, maintain call bell within reach at all times, provide assistance with transfer out of bed and ambulation.  Medication Inspection Compliance: Pill count conducted under aseptic conditions, in front of the patient. Neither the pills nor the bottle was removed from the patient's sight at any time. Once count was completed pills were immediately returned to the patient in their original bottle.  Medication: Oxycodone  IR Pill/Patch Count: 29 of 60 pills/patches remain Pill/Patch Appearance: Markings consistent with prescribed medication Bottle Appearance: Standard pharmacy container. Clearly labeled. Filled Date: 06 / 27/ 2025 Last Medication intake:  Today  UDS:  Summary  Date Value Ref Range Status  08/22/2022 Note  Final    Comment:    ==================================================================== [employer] Select 13  (MW) ====================================================================  Test                             Result       Flag       Units  Drug Present and Declared for Prescription Verification   Oxycodone                       1803         EXPECTED   ng/mg creat   Oxymorphone                    74           EXPECTED   ng/mg creat   Noroxycodone                   2697         EXPECTED   ng/mg creat   Noroxymorphone                 82           EXPECTED   ng/mg creat    Sources of oxycodone  are scheduled prescription medications.    Oxymorphone, noroxycodone, and noroxymorphone are expected    metabolites of oxycodone . Oxymorphone is also available as a    scheduled prescription medication.  ==================================================================== Test                      Result    Flag   Units      Ref Range   Creatinine              119              mg/dL      >=79 ==================================================================== Declared Medications:  The flagging and interpretation on this report are based on the  following declared medications.  Unexpected results may arise from  inaccuracies in the declared medications.   **Note: The testing scope of this panel includes these medications:   Oxycodone  (Roxicodone )   **Note: The testing scope of this panel does not include the  following reported medications:   Acetaminophen  (Excedrin)  Aspirin  (Excedrin)  Caffeine (Excedrin)  Duloxetine  (Cymbalta )  Famotidine  (Pepcid )  Goserelin  Insulin  (Lantus )  Metformin  (Glucophage )  Naloxone  (Narcan )  Ondansetron  (Zofran )  Pregabalin  (Lyrica )  Rosuvastatin  (Crestor )  Sitagliptin  (Januvia )  Sumatriptan  (Imitrex )  Tamoxifen  (Nolvadex )  Vitamin D2 (Drisdol ) ==================================================================== For clinical consultation, please call [phone]. ====================================================================     No results  found for: CBDTHCR No results found for: D8THCCBX No results found for: D9THCCBX  ROS  Constitutional: Denies any fever or chills Gastrointestinal: No reported hemesis, hematochezia, vomiting, or acute GI distress Musculoskeletal: Right knee pain Neurological: No reported episodes of acute onset apraxia, aphasia, dysarthria, agnosia, amnesia, paralysis, loss of coordination, or loss of consciousness  Medication Review  DULoxetine , FreeStyle Libre 3 Plus Sensor, Insulin  Pen Needle, OneTouch Verio, SUMAtriptan , aspirin -acetaminophen -caffeine, buPROPion , busPIRone , dapagliflozin  propanediol, famotidine , gabapentin , glucose blood, insulin  glargine, letrozole , meloxicam , metFORMIN , naloxone , ondansetron , oxyCODONE , rosuvastatin , and sitaGLIPtin   History Review  Allergy: Ms. Leis is allergic to aspirin . Drug: Ms. Calbert  reports no history of drug use. Alcohol:  reports no history of alcohol use. Tobacco:  reports that she has quit smoking. Her smoking use included cigarettes. She started smoking about 11 years ago. She has a 11 pack-year smoking history. She has quit using smokeless tobacco.  Her smokeless tobacco use included  snuff. Social: Ms. Ulatowski  reports that she has quit smoking. Her smoking use included cigarettes. She started smoking about 11 years ago. She has a 11 pack-year smoking history. She has quit using smokeless tobacco.  Her smokeless tobacco use included snuff. She reports that she does not drink alcohol and does not use drugs. Medical:  has a past medical history of Allergy, Breast cancer (HCC), Cancer (HCC) (06/15/2017), Depression, Diabetes mellitus without complication (HCC) (2017), Edema of left upper extremity, Endometriosis, Family history of breast cancer, Headache, Hyperlipidemia, Lymphedema of left arm, Ovarian mass, Personal history of chemotherapy, Personal history of radiation therapy, and Pneumonia. Surgical: Ms. Scruggs  has a past surgical history that  includes Oophorectomy; Tubal ligation; Portacath placement (Right, 07/14/2017); Axillary lymph node biopsy (Left, 07/14/2017); Breast lumpectomy (Left, 01/12/2018); Breast biopsy (Left); Partial mastectomy with needle localization (Left, 01/12/2018); Sentinel node biopsy (Left, 01/12/2018); Colonoscopy with propofol  (N/A, 12/27/2019); and Colonoscopy with propofol  (N/A, 01/05/2022). Family: family history includes Breast cancer in an other family member; Breast cancer (age of onset: 90) in her maternal grandmother; Breast cancer (age of onset: 40) in an other family member; Breast cancer (age of onset: 39) in her maternal aunt; Cancer in her mother; Colon cancer in her maternal grandmother and mother; Diabetes in her brother; Other in her father; Pancreatitis in her brother; Prostate cancer (age of onset: 22) in her brother.  Laboratory Chemistry Profile   Renal Lab Results  Component Value Date   BUN 13 07/05/2023   CREATININE 0.81 07/05/2023   BCR 16 07/05/2023   GFRAA >60 10/21/2019   GFRNONAA >60 03/01/2023    Hepatic Lab Results  Component Value Date   AST 16 07/05/2023   ALT 17 07/05/2023   ALBUMIN 3.8 07/05/2023   ALKPHOS 101 07/05/2023   LIPASE 31 12/01/2020    Electrolytes Lab Results  Component Value Date   NA 139 07/05/2023   K 4.4 07/05/2023   CL 102 07/05/2023   CALCIUM  9.2 07/05/2023   MG 2.3 03/07/2022    Bone Lab Results  Component Value Date   VD25OH 99.50 11/29/2022    Inflammation (CRP: Acute Phase) (ESR: Chronic Phase) Lab Results  Component Value Date   CRP <1 12/21/2020   ESRSEDRATE 3 12/21/2020         Note: Above Lab results reviewed.  Recent Imaging Review  DG Bone Density EXAM: DUAL X-RAY ABSORPTIOMETRY (DXA) FOR BONE MINERAL DENSITY 07/18/2023 10:05 am  CLINICAL DATA:  58 year old Female Postmenopausal. Screening for osteoporosis  History of fragility fracture. Patient is or has been on bone building therapies.  TECHNIQUE: An axial (e.g.,  hips, spine) and/or appendicular (e.g., radius) exam was performed, as appropriate, using [facility] Secretary/administrator at [facility]. Images are obtained for bone mineral density measurement and are not obtained for diagnostic purposes. [id number]  Exclusions: L3-L4 due to degenerative changes  COMPARISON:  None. New baseline.  FINDINGS: Scan quality: Good.  LUMBAR SPINE (L1-L2):  BMD (in g/cm2): 1.116  T-score: -0.5  Z-score: 0.4  LEFT FEMORAL NECK:  BMD (in g/cm2): 1.083  T-score: 0.3  Z-score: 1.4  LEFT TOTAL HIP:  BMD (in g/cm2): 1.122  T-score: 0.9  Z-score: 1.6  RIGHT FEMORAL NECK:  BMD (in g/cm2): 1.056  T-score: 0.1  Z-score: 1.2  RIGHT TOTAL HIP:  BMD (in g/cm2): 1.082  T-score: 0.6  Z-score: 1.3  FRAX 10-YEAR PROBABILITY OF FRACTURE:  FRAX not reported as the lowest BMD is not in the  osteopenia range.  IMPRESSION: Normal based on BMD.  Fracture risk is unknown due to history of bone building therapy.  RECOMMENDATIONS: 1. All patients should optimize calcium  and vitamin D  intake.  2. Consider FDA-approved medical therapies in postmenopausal women and men aged 32 years and older, based on the following:  - A hip or vertebral (clinical or morphometric) fracture  - T-score less than or equal to -2.5 and secondary causes have been excluded.  - Low bone mass (T-score between -1.0 and -2.5) and a 10-year probability of a hip fracture greater than or equal to 3% or a 10-year probability of a major osteoporosis-related fracture greater than or equal to 20% based on the [place] -adapted WHO algorithm.  - Clinician judgment and/or patient preferences may indicate treatment for people with 10-year fracture probabilities above or below these levels  3. Patients with diagnosis of osteoporosis or at high risk for fracture should have regular bone mineral density tests. For patients eligible for Medicare, routine testing is  allowed once every 2 years. The testing frequency can be increased to one year for patients who have rapidly progressing disease, those who are receiving or discontinuing medical therapy to restore bone mass, or have additional risk factors.  Electronically Signed   By: Reyes Phi M.D.   On: 07/18/2023 10:51 Note: Reviewed        Physical Exam  Vitals: BP 120/84 (BP Location: Right Arm, Patient Position: Sitting, Cuff Size: Normal)   Pulse 94   Temp (!) 97.4 F (36.3 C) (Temporal)   Resp 16   Ht 5' 4 (1.626 m)   Wt 192 lb (87.1 kg)   LMP  (LMP Unknown) Comment: LAST PERIOD IN MAY 2019 WHEN SHE STARTED CHEMO  SpO2 100%   BMI 32.96 kg/m  BMI: Estimated body mass index is 32.96 kg/m as calculated from the following:   Height as of this encounter: 5' 4 (1.626 m).   Weight as of this encounter: 192 lb (87.1 kg). Ideal: Ideal body weight: 54.7 kg (120 lb 9.5 oz) Adjusted ideal body weight: 67.7 kg (149 lb 2.5 oz) General appearance: Well nourished, well developed, and well hydrated. In no apparent acute distress Mental status: Alert, oriented x 3 (person, place, & time)       Respiratory: No evidence of acute respiratory distress Eyes: PERLA   Assessment   Diagnosis Status  1. Chronic feet pain (1ry area of Pain) (Bilateral) (R>L)   2. Chronic knee pain (2ry area of Pain) (Bilateral) (R>L)   3. Chronic hand pain (3ry area of Pain) (Bilateral) (R>L)   4. Cervicalgia   5. Cancer-related pain   6. Lumbar facet syndrome (Bilateral) (L>R)   7. Chronic low back pain (Bilateral (L>R) w/o sciatica   8. 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 Chronic hip pain (4th area of Pain) (Bilateral) (R>L)   10. 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 Abnormal MRI, lumbar spine (04/20/2018)   12. Chronic pain syndrome   13. Pharmacologic therapy   14. Chronic use of opiate for therapeutic purpose   15. Encounter for  medication management   16. Encounter for chronic pain management    Controlled Controlled Controlled   Updated Problems: No problems updated.  Plan of Care  Problem-specific:  Assessment and Plan Will continue on current medication regimen.  Prescribing drug monitoring (PDMP) reviewed; findings consistent with the use of prescribed medication and no  evidence of narcotic misuse or abuse. Routine UDS ordered today.  Schedule follow-up in 90 days for medication management.  No other new issues or problems reported to this visit.  Ms. ARLIS EVERLY has a current medication list which includes the following long-term medication(s): bupropion , duloxetine , famotidine , gabapentin , metformin , rosuvastatin , sitagliptin , sumatriptan , [START ON 09/03/2023] oxycodone , [START ON 10/03/2023] oxycodone , and [START ON 11/02/2023] oxycodone .  Pharmacotherapy (Medications Ordered): Meds ordered this encounter  Medications   oxyCODONE  (OXY IR/ROXICODONE ) 5 MG immediate release tablet    Sig: Take 1 tablet (5 mg total) by mouth 2 (two) times daily as needed for severe pain (pain score 7-10). Must last 30 days    Dispense:  60 tablet    Refill:  0    DO NOT: delete (not duplicate); no partial-fill (will deny script to complete), no refill request (F/U required). DISPENSE: 1 day early if closed on fill date. WARN: No CNS-depressants within 8 hrs of med.   oxyCODONE  (OXY IR/ROXICODONE ) 5 MG immediate release tablet    Sig: Take 1 tablet (5 mg total) by mouth 2 (two) times daily as needed for severe pain (pain score 7-10). Must last 30 days    Dispense:  60 tablet    Refill:  0    DO NOT: delete (not duplicate); no partial-fill (will deny script to complete), no refill request (F/U required). DISPENSE: 1 day early if closed on fill date. WARN: No CNS-depressants within 8 hrs of med.   oxyCODONE  (OXY IR/ROXICODONE ) 5 MG immediate release tablet    Sig: Take 1 tablet (5 mg total) by mouth 2 (two) times daily  as needed for severe pain (pain score 7-10). Must last 30 days    Dispense:  60 tablet    Refill:  0    DO NOT: delete (not duplicate); no partial-fill (will deny script to complete), no refill request (F/U required). DISPENSE: 1 day early if closed on fill date. WARN: No CNS-depressants within 8 hrs of med.   Orders:  Orders Placed This Encounter  Procedures   Tox[employer] Select 13 (MW), Urine    Volume: 30 ml(s). Minimum 3 ml of urine is needed. Document temperature of fresh sample. Indications: Long term (current) use of opiate analgesic (S20.108)    Release to patient:   Immediate        Return in about 3 months (around 11/21/2023) for (F2F), (MM), Emmy Blanch NP.    Recent Visits No visits were found meeting these conditions. Showing recent visits within past 90 days and meeting all other requirements Today's Visits Date Type Provider Dept  08/21/23 Office Visit Teller Wakefield K, NP [facility]  Showing today's visits and meeting all other requirements Future Appointments No visits were found meeting these conditions. Showing future appointments within next 90 days and meeting all other requirements  I discussed the assessment and treatment plan with the patient. The patient was provided an opportunity to ask questions and all were answered. The patient agreed with the plan and demonstrated an understanding of the instructions.  Patient advised to call back or seek an in-person evaluation if the symptoms or condition worsens.  Duration of encounter: 30 minutes.  Total time on encounter, as per AMA guidelines included both the face-to-face and non-face-to-face time personally spent by the physician and/or other qualified health care professional(s) on the day of the encounter (includes time in activities that require the physician or other qualified health care professional and does not include time in activities  normally performed by clinical staff). Physician's time may  include the following activities when performed: Preparing to see the patient (e.g., pre-charting review of records, searching for previously ordered imaging, lab work, and nerve conduction tests) Review of prior analgesic pharmacotherapies. Reviewing PMP Interpreting ordered tests (e.g., lab work, imaging, nerve conduction tests) Performing post-procedure evaluations, including interpretation of diagnostic procedures Obtaining and/or reviewing separately obtained history Performing a medically appropriate examination and/or evaluation Counseling and educating the patient/family/caregiver Ordering medications, tests, or procedures Referring and communicating with other health care professionals (when not separately reported) Documenting clinical information in the electronic or other health record Independently interpreting results (not separately reported) and communicating results to the patient/ family/caregiver Care coordination (not separately reported)  Note by: Sianna Garofano K Lasondra Hodgkins, NP (TTS and AI technology used. I apologize for any typographical errors that were not detected and corrected.) Date: 08/21/2023; Time: 12:36 PM

## 2023-08-23 LAB — TOXASSURE SELECT 13 (MW), URINE

## 2023-08-29 ENCOUNTER — Inpatient Hospital Stay: Payer: Commercial Managed Care - PPO | Attending: Internal Medicine | Admitting: Internal Medicine

## 2023-08-29 ENCOUNTER — Encounter: Payer: Self-pay | Admitting: Internal Medicine

## 2023-08-29 ENCOUNTER — Inpatient Hospital Stay: Payer: Commercial Managed Care - PPO

## 2023-08-29 VITALS — BP 119/79 | HR 91 | Temp 97.9°F | Resp 16 | Ht 64.0 in | Wt 196.7 lb

## 2023-08-29 DIAGNOSIS — Z170 Estrogen receptor positive status [ER+]: Secondary | ICD-10-CM

## 2023-08-29 DIAGNOSIS — Z79899 Other long term (current) drug therapy: Secondary | ICD-10-CM | POA: Insufficient documentation

## 2023-08-29 DIAGNOSIS — Z1721 Progesterone receptor positive status: Secondary | ICD-10-CM | POA: Diagnosis not present

## 2023-08-29 DIAGNOSIS — Z1732 Human epidermal growth factor receptor 2 negative status: Secondary | ICD-10-CM | POA: Insufficient documentation

## 2023-08-29 DIAGNOSIS — C50512 Malignant neoplasm of lower-outer quadrant of left female breast: Secondary | ICD-10-CM | POA: Diagnosis not present

## 2023-08-29 DIAGNOSIS — F32A Depression, unspecified: Secondary | ICD-10-CM | POA: Diagnosis not present

## 2023-08-29 DIAGNOSIS — Z79811 Long term (current) use of aromatase inhibitors: Secondary | ICD-10-CM | POA: Diagnosis not present

## 2023-08-29 LAB — CBC WITH DIFFERENTIAL (CANCER CENTER ONLY)
Abs Immature Granulocytes: 0.01 K/uL (ref 0.00–0.07)
Basophils Absolute: 0 K/uL (ref 0.0–0.1)
Basophils Relative: 1 %
Eosinophils Absolute: 0.1 K/uL (ref 0.0–0.5)
Eosinophils Relative: 1 %
HCT: 36.3 % (ref 36.0–46.0)
Hemoglobin: 11.6 g/dL — ABNORMAL LOW (ref 12.0–15.0)
Immature Granulocytes: 0 %
Lymphocytes Relative: 26 %
Lymphs Abs: 1.9 K/uL (ref 0.7–4.0)
MCH: 31.3 pg (ref 26.0–34.0)
MCHC: 32 g/dL (ref 30.0–36.0)
MCV: 97.8 fL (ref 80.0–100.0)
Monocytes Absolute: 0.5 K/uL (ref 0.1–1.0)
Monocytes Relative: 6 %
Neutro Abs: 5 K/uL (ref 1.7–7.7)
Neutrophils Relative %: 66 %
Platelet Count: 240 K/uL (ref 150–400)
RBC: 3.71 MIL/uL — ABNORMAL LOW (ref 3.87–5.11)
RDW: 14.5 % (ref 11.5–15.5)
WBC Count: 7.6 K/uL (ref 4.0–10.5)
nRBC: 0 % (ref 0.0–0.2)

## 2023-08-29 LAB — CMP (CANCER CENTER ONLY)
ALT: 20 U/L (ref 0–44)
AST: 19 U/L (ref 15–41)
Albumin: 4.1 g/dL (ref 3.5–5.0)
Alkaline Phosphatase: 79 U/L (ref 38–126)
Anion gap: 7 (ref 5–15)
BUN: 12 mg/dL (ref 6–20)
CO2: 27 mmol/L (ref 22–32)
Calcium: 9.5 mg/dL (ref 8.9–10.3)
Chloride: 105 mmol/L (ref 98–111)
Creatinine: 0.96 mg/dL (ref 0.44–1.00)
GFR, Estimated: >60 mL/min (ref >60)
Glucose, Bld: 156 mg/dL — ABNORMAL HIGH (ref 70–99)
Potassium: 3.6 mmol/L (ref 3.5–5.1)
Sodium: 139 mmol/L (ref 135–145)
Total Bilirubin: 0.7 mg/dL (ref 0.0–1.2)
Total Protein: 7.2 g/dL (ref 6.5–8.1)

## 2023-08-29 LAB — IRON AND TIBC
Iron: 63 ug/dL (ref 28–170)
Saturation Ratios: 20 % (ref 10.4–31.8)
TIBC: 314 ug/dL (ref 250–450)
UIBC: 251 ug/dL

## 2023-08-29 LAB — FERRITIN: Ferritin: 105 ng/mL (ref 11–307)

## 2023-08-29 MED ORDER — ZOLEDRONIC ACID 4 MG/100ML IV SOLN
4.0000 mg | Freq: Once | INTRAVENOUS | Status: AC
  Administered 2023-08-29: 4 mg via INTRAVENOUS
  Filled 2023-08-29: qty 100

## 2023-08-29 MED ORDER — SODIUM CHLORIDE 0.9 % IV SOLN
Freq: Once | INTRAVENOUS | Status: AC
  Filled 2023-08-29: qty 250

## 2023-08-29 NOTE — Progress Notes (Signed)
 07/18/23 bone density.

## 2023-08-29 NOTE — Addendum Note (Signed)
 Addended by: JOSHUA ALFONSO CROME on: 08/29/2023 03:27 PM   Modules accepted: Orders

## 2023-08-29 NOTE — Progress Notes (Signed)
 [facility] CONSULT NOTE  Patient Care Team: Valerio Melanie DASEN, NP as PCP - General (Nurse Practitioner) Hobart Powell BRAVO, MD (Inactive) as PCP - Cardiology (Cardiology) [name], Reyes ORN, MD (General Surgery) Maurie Rayfield BIRCH, RN as Registered Nurse Elby, Webb Loges, [name] (Obstetrics and Gynecology) Rennie Carolyn SAUNDERS, MD as Consulting Physician (Oncology) Leora Lynwood SAUNDERS, MD as Consulting Physician (Orthopedic Surgery)  CHIEF COMPLAINTS/PURPOSE OF CONSULTATION: Breast cancer  Oncology History Overview Note  # MAY 2019-  clinical stage IIIA (T3N1Mx) left breast cancer s/p biopsy on 06/14/2017. -Pathology revealed grade III invasive ductal carcinoma. -Axillary FNA revealed malignant cells c/w metastatic carcinoma. Tumor was ER + (90%), PR + (30%), Her2/neu - and Ki67 70%.  CA27.29 was 7.8 on 06/14/2017.  # She received 4 cycles of AC with Neulasta  support (07/20/2017 - 08/31/2017).;  neoadjuvant Taxol  on 09/14/2017.  #DEC 2019- Lumpectomy/sentinel lymph node biopsy [Dr.[name]]-complete pathologic response  # s/p RT [delayed sec to wound infection; Dr.[name]] finished RT [4/12]  # April 14th 2020- START TAM; stopped in mid-May secondary intolerance [severe migraines].  # 18th May 2020-start Arimidex  [hormonal profile-postmenopausal;add Zoladex  q3M]; STOPPED sec intolerance/joint pain; NOV 2021- STARTED AROMASIN ; Stopped x 2 months sec to extreme fatigue/severe joint pains.   # July 2022- START tamoxifen  10 mg a day; Stopped Tamoxifen  June 2024.   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 Secord/Berchuck]-negative for malignancy/ # DM-2- poorly controlled.   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   # PAP SMEAR- RECOMMENDED 2022- summer  -------------------------------------------  DIAGNOSIS: left breast  cancer  STAGE:  III       ;GOALS: cure      Cancer of midline of breast, left (HCC) (Resolved)  06/15/2017 Initial Diagnosis   Cancer of midline of breast, left (HCC)   07/20/2017 - 11/16/2017 Chemotherapy   The patient had dexamethasone  (DECADRON ) 4 MG tablet, 1 of 1 cycle, Start date: --, End date: -- DOXOrubicin  (ADRIAMYCIN ) chemo injection 122 mg, 60 mg/m2 = 122 mg, Intravenous,  Once, 4 of 4 cycles Administration: 122 mg (07/20/2017), 122 mg (08/03/2017), 122 mg (08/17/2017), 122 mg (08/31/2017) palonosetron  (ALOXI ) injection 0.25 mg, 0.25 mg, Intravenous,  Once, 4 of 4 cycles Administration: 0.25 mg (07/20/2017), 0.25 mg (08/03/2017), 0.25 mg (08/17/2017), 0.25 mg (08/31/2017) pegfilgrastim  (NEULASTA ) injection 6 mg, 6 mg, Subcutaneous, Once, 5 of 5 cycles Administration: 6 mg (07/21/2017), 6 mg (08/04/2017), 6 mg (08/18/2017), 6 mg (09/01/2017) cyclophosphamide  (CYTOXAN ) 1,220 mg in sodium chloride  0.9 % 250 mL chemo infusion, 600 mg/m2 = 1,220 mg, Intravenous,  Once, 4 of 4 cycles Administration: 1,220 mg (07/20/2017), 1,220 mg (08/03/2017), 1,220 mg (08/17/2017), 1,220 mg (08/31/2017) PACLitaxel  (TAXOL ) 162 mg in sodium chloride  0.9 % 250 mL chemo infusion (</= 80mg /m2), 80 mg/m2 = 162 mg, Intravenous,  Once, 10 of 12 cycles Dose modification: 65 mg/m2 (original dose 80 mg/m2, Cycle 13, Reason: Provider Judgment, Comment: neuropathy) Administration: 162 mg (09/14/2017), 162 mg (09/21/2017), 162 mg (09/28/2017), 162 mg (10/05/2017), 162 mg (10/12/2017), 162 mg (10/19/2017), 162 mg (10/26/2017), 132 mg (11/02/2017), 132 mg (11/09/2017), 132 mg (11/16/2017) fosaprepitant  (EMEND ) 150 mg, dexamethasone  (DECADRON ) 12 mg in sodium chloride  0.9 % 145 mL IVPB, , Intravenous,  Once, 4 of 4 cycles Administration:  (07/20/2017),  (08/03/2017),  (08/17/2017),  (08/31/2017)  for chemotherapy treatment.    Malignant neoplasm of lower-outer quadrant of left  breast of female, estrogen receptor positive (HCC)  11/08/2017 Initial  Diagnosis   Malignant neoplasm of lower-outer quadrant of left breast of female, estrogen receptor positive (HCC)    HISTORY OF PRESENTING ILLNESS: Ambulating independently.  Alone.  Carolyn Roy 58 y.o.  female with a history of stage III ER PR positive HER-2/neu negative breast cancer currently on Letrozole    /Zometa  every 6 months is here for follow-up.   Patient continues to have difficulty with her mood- swinging between extremes of emotions. States to be compliant with her anti-depressants.   Complains of hot flashes; and also joint pains. Patient denies any new lumps or bumps.  Appetite is good.  No weight loss.   Review of Systems  Constitutional:  Positive for malaise/fatigue. Negative for chills, diaphoresis, fever and weight loss.  HENT:  Negative for nosebleeds and sore throat.   Eyes:  Negative for double vision.  Respiratory:  Negative for cough, hemoptysis, sputum production, shortness of breath and wheezing.   Cardiovascular:  Negative for chest pain, palpitations, orthopnea and leg swelling.  Gastrointestinal:  Negative for abdominal pain, blood in stool, constipation, diarrhea, heartburn, melena, nausea and vomiting.  Genitourinary:  Negative for dysuria, frequency and urgency.  Musculoskeletal:  Positive for back pain and joint pain.  Skin: Negative.  Negative for itching and rash.  Neurological:  Positive for tingling. Negative for dizziness, focal weakness, weakness and headaches.  Endo/Heme/Allergies:  Does not bruise/bleed easily.  Psychiatric/Behavioral:  Negative for depression. The patient is not nervous/anxious and does not have insomnia.      MEDICAL HISTORY:  Past Medical History:  Diagnosis Date   Allergy    Breast cancer (HCC)    Cancer (HCC) 06/15/2017   5.1 cm, T3,N1 (clinical): ER/ PR positive, Her 2 neu not overexpressed, High Ki 67. Neuoadjuvant chemotherapy.    Depression    Diabetes mellitus without complication (HCC) 2017   Edema of left  upper extremity    Endometriosis    Family history of breast cancer    Headache    migraines   Hyperlipidemia    Lymphedema of left arm    Ovarian mass    Personal history of chemotherapy    Personal history of radiation therapy    Pneumonia    2018    SURGICAL HISTORY: Past Surgical History:  Procedure Laterality Date   AXILLARY LYMPH NODE BIOPSY Left 07/14/2017   Procedure: INSERTION GEL MARK CLIP LEFT AXILLA;  Surgeon: Dessa Reyes ORN, MD;  Location: [facility] ORS;  Service: General;  Laterality: Left;   BREAST BIOPSY Left    Dr Curtis BREAST METASTATIC CARCINOMA   BREAST LUMPECTOMY Left 01/12/2018   COLONOSCOPY WITH PROPOFOL  N/A 12/27/2019   Procedure: COLONOSCOPY WITH PROPOFOL ;  Surgeon: Janalyn Keene NOVAK, MD;  Location: [facility] ENDOSCOPY;  Service: Endoscopy;  Laterality: N/A;   COLONOSCOPY WITH PROPOFOL  N/A 01/05/2022   Procedure: COLONOSCOPY WITH PROPOFOL ;  Surgeon: Unk Corinn Skiff, MD;  Location: [facility] ENDOSCOPY;  Service: Gastroenterology;  Laterality: N/A;   OOPHORECTOMY     PARTIAL MASTECTOMY WITH NEEDLE LOCALIZATION Left 01/12/2018   Procedure: PARTIAL MASTECTOMY WITH NEEDLE LOCALIZATION;  Surgeon: Dessa Reyes ORN, MD;  Location: [facility] ORS;  Service: General;  Laterality: Left;   PORTACATH PLACEMENT Right 07/14/2017   Procedure: INSERTION PORT-A-CATH;  Surgeon: Dessa Reyes ORN, MD;  Location: [facility] ORS;  Service: General;  Laterality: Right;   SENTINEL NODE BIOPSY Left 01/12/2018   Procedure: SENTINEL NODE BIOPSY;  Surgeon: Dessa Reyes ORN, MD;  Location:  [facility] ORS;  Service: General;  Laterality: Left;   TUBAL LIGATION      SOCIAL HISTORY: Social History   Socioeconomic History   Marital status: Married    Spouse name: Not on file   Number of children: Not on file   Years of education: Not on file   Highest education level: GED or equivalent  Occupational History   Not on file  Tobacco Use   Smoking status: Former    Average packs/day: 1 pack/day  for 11.0 years (11.0 ttl pk-yrs)    Types: Cigarettes    Start date: 01/07/2012   Smokeless tobacco: Former    Types: Snuff  Vaping Use   Vaping status: Every Day  Substance and Sexual Activity   Alcohol use: No    Alcohol/week: 0.0 standard drinks of alcohol   Drug use: No   Sexual activity: Yes  Other Topics Concern   Not on file  Social History Narrative   ** Merged History Encounter **       Social Drivers of Health   Financial Resource Strain: Low Risk  (06/06/2022)   Overall Financial Resource Strain (CARDIA)    Difficulty of Paying Living Expenses: Not hard at all  Food Insecurity: Food Insecurity Present (06/06/2022)   Hunger Vital Sign    Worried About Running Out of Food in the Last Year: Sometimes true    Ran Out of Food in the Last Year: Sometimes true  Transportation Needs: No Transportation Needs (06/06/2022)   PRAPARE - Administrator, Civil Service (Medical): No    Lack of Transportation (Non-Medical): No  Physical Activity: Sufficiently Active (06/06/2022)   Exercise Vital Sign    Days of Exercise per Week: 5 days    Minutes of Exercise per Session: 150+ min  Stress: No Stress Concern Present (06/06/2022)   [employer] of Occupational Health - Occupational Stress Questionnaire    Feeling of Stress : Only a little  Social Connections: Socially Isolated (06/06/2022)   Social Connection and Isolation Panel    Frequency of Communication with Friends and Family: Once a week    Frequency of Social Gatherings with Friends and Family: Never    Attends Religious Services: Never    Diplomatic Services operational officer: No    Attends Engineer, structural: Not on file    Marital Status: Married  Catering manager Violence: Not on file    FAMILY HISTORY: Family History  Problem Relation Age of Onset   Colon cancer Mother    Cancer Mother    Other Father        family hx on dad's side: breast, colon, stomach cancer-biological dad    Diabetes Brother    Pancreatitis Brother    Prostate cancer Brother 73       currently 32 / maternal half-brother   Breast cancer Maternal Aunt 75       currently 47   Breast cancer Maternal Grandmother 40       deceased 66s   Colon cancer Maternal Grandmother    Breast cancer Other 90       mother's sister; deceased 60   Breast cancer Other        mother's sister; age at dx unknown    ALLERGIES:  is allergic to aspirin .  MEDICATIONS:  Current Outpatient Medications  Medication Sig Dispense Refill   aspirin -acetaminophen -caffeine (EXCEDRIN MIGRAINE) 250-250-65 MG tablet Take 2 tablets by mouth daily as needed for headache.  Blood Glucose Monitoring Suppl (ONETOUCH VERIO) w/Device KIT Use to check blood sugar 3 times a day and document results, bring to appointments.  Goal is <130 fasting blood sugar and <180 two hours after meals. 1 kit 0   buPROPion  (WELLBUTRIN  XL) 300 MG 24 hr tablet Take 1 tablet (300 mg total) by mouth daily. 90 tablet 4   busPIRone  (BUSPAR ) 5 MG tablet Take 1 tablet (5 mg total) by mouth 2 (two) times daily. 180 tablet 1   Continuous Glucose Sensor (FREESTYLE LIBRE 3 PLUS SENSOR) MISC by Does not apply route. Change sensor every 15 days.     dapagliflozin  propanediol (FARXIGA ) 10 MG TABS tablet Take 1 tablet (10 mg total) by mouth daily before breakfast. 30 tablet 5   DULoxetine  (CYMBALTA ) 60 MG capsule Take 1 capsule (60 mg total) by mouth daily. 90 capsule 4   famotidine  (PEPCID ) 20 MG tablet TAKE 1 TABLET BY MOUTH EVERY DAY 90 tablet 3   gabapentin  (NEURONTIN ) 300 MG capsule Take 300 MG (one capsule) by mouth in the morning and then take 600 MG (two capsules) by mouth in the evening. 270 capsule 4   glucose blood test strip Use to check blood sugar 3 times daily, fasting in morning with goal <130 and 2 hours after meals with goal <180.  Bring blood sugar log to visits. 100 each 12   insulin  glargine (LANTUS ) 100 UNIT/ML injection Inject 50 Units into the skin  daily.     Insulin  Pen Needle (BD PEN NEEDLE NANO 2ND GEN) 32G X 4 MM MISC For use with insulin  pens daily. 100 each 12   letrozole  (FEMARA ) 2.5 MG tablet Take 1 tablet (2.5 mg total) by mouth daily. 90 tablet 1   meloxicam  (MOBIC ) 15 MG tablet Take 1 tablet (15 mg total) by mouth daily. 90 tablet 1   naloxone  (NARCAN ) nasal spray 4 mg/0.1 mL Place 1 spray into the nose as needed for up to 365 doses (for opioid-induced respiratory depresssion). In case of emergency (overdose), spray once into each nostril. If no response within 3 minutes, repeat application and call 911. 1 each 0   ondansetron  (ZOFRAN ) 8 MG tablet Take 1 tablet (8 mg total) by mouth 2 (two) times daily as needed for nausea or vomiting. 60 tablet 4   [START ON 09/03/2023] oxyCODONE  (OXY IR/ROXICODONE ) 5 MG immediate release tablet Take 1 tablet (5 mg total) by mouth 2 (two) times daily as needed for severe pain (pain score 7-10). Must last 30 days 60 tablet 0   [START ON 10/03/2023] oxyCODONE  (OXY IR/ROXICODONE ) 5 MG immediate release tablet Take 1 tablet (5 mg total) by mouth 2 (two) times daily as needed for severe pain (pain score 7-10). Must last 30 days 60 tablet 0   [START ON 11/02/2023] oxyCODONE  (OXY IR/ROXICODONE ) 5 MG immediate release tablet Take 1 tablet (5 mg total) by mouth 2 (two) times daily as needed for severe pain (pain score 7-10). Must last 30 days 60 tablet 0   rosuvastatin  (CRESTOR ) 40 MG tablet Take 1 tablet (40 mg total) by mouth daily. 90 tablet 4   sitaGLIPtin  (JANUVIA ) 100 MG tablet Take 1 tablet (100 mg total) by mouth daily. Please bill to manufacturer copay card on patient's profile 30 tablet 2   SUMAtriptan  (IMITREX ) 100 MG tablet Take 1 tablet (100 mg total) by mouth every 2 (two) hours as needed for migraine. May repeat in 2 hours if headache persists or recurs. 10 tablet 4   metFORMIN  (  GLUCOPHAGE ) 1000 MG tablet Take 1 tablet (1,000 mg total) by mouth 2 (two) times daily with a meal. (Patient not taking:  Reported on 08/29/2023) 180 tablet 3   No current facility-administered medications for this visit.   Facility-Administered Medications Ordered in Other Visits  Medication Dose Route Frequency Provider Last Rate Last Admin   heparin  lock flush 100 unit/mL  500 Units Intravenous Once Corcoran, Melissa C, MD       sodium chloride  flush (NS) 0.9 % injection 10 mL  10 mL Intravenous Once Corcoran, Melissa C, MD       Zoledronic  Acid (ZOMETA ) IVPB 4 mg  4 mg Intravenous Once Dayson Aboud R, MD          .  PHYSICAL EXAMINATION: ECOG PERFORMANCE STATUS: 0 - Asymptomatic  Vitals:   08/29/23 1425  BP: 119/79  Pulse: 91  Resp: 16  Temp: 97.9 F (36.6 C)  SpO2: 98%    Filed Weights   08/29/23 1425  Weight: 196 lb 11.2 oz (89.2 kg)     Physical Exam HENT:     Head: Normocephalic and atraumatic.     Mouth/Throat:     Pharynx: No oropharyngeal exudate.  Eyes:     Pupils: Pupils are equal, round, and reactive to light.  Cardiovascular:     Rate and Rhythm: Normal rate and regular rhythm.  Pulmonary:     Effort: No respiratory distress.     Breath sounds: No wheezing.  Abdominal:     General: Bowel sounds are normal. There is no distension.     Palpations: Abdomen is soft. There is no mass.     Tenderness: There is no abdominal tenderness. There is no guarding or rebound.  Musculoskeletal:        General: No tenderness. Normal range of motion.     Cervical back: Normal range of motion and neck supple.  Skin:    General: Skin is warm.     Comments:     Neurological:     Mental Status: She is alert and oriented to person, place, and time.  Psychiatric:        Mood and Affect: Affect normal.      LABORATORY DATA:  I have reviewed the data as listed Lab Results  Component Value Date   WBC 7.6 08/29/2023   HGB 11.6 (L) 08/29/2023   HCT 36.3 08/29/2023   MCV 97.8 08/29/2023   PLT 240 08/29/2023   Recent Labs    11/29/22 1241 01/03/23 0846 03/01/23 1405  04/03/23 0830 07/05/23 0915 08/29/23 1427  NA 140   < > 143 141 139 139  K 3.5   < > 3.4* 4.0 4.4 3.6  CL 106   < > 103 102 102 105  CO2 26   < > 30 26 23 27   GLUCOSE 197*   < > 181* 154* 347* 156*  BUN 12   < > 14 15 13 12   CREATININE 0.85   < > 0.67 1.09* 0.81 0.96  CALCIUM  9.2   < > 9.8 9.4 9.2 9.5  GFRNONAA >60  --  >60  --   --  >60  PROT 6.6   < > 6.2* 6.1 5.9* 7.2  ALBUMIN 3.6   < > 3.4* 3.9 3.8 4.1  AST 22   < > 18 18 16 19   ALT 21   < > 15 18 17 20   ALKPHOS 65   < > 62 86 101 79  BILITOT 0.4   < >  0.5 0.4 0.3 0.7   < > = values in this interval not displayed.    RADIOGRAPHIC STUDIES: I have personally reviewed the radiological images as listed and agreed with the findings in the report. No results found.  ASSESSMENT & PLAN:   Malignant neoplasm of lower-outer quadrant of left breast of female, estrogen receptor positive (HCC) # May 2019- HIGH RISK- LEFT  Breast cancer Stage III ER/PR pos her 2 NEG on OFS+ Letrozole . Also on -Adjuvant Zometa  q6M [June 2022 x3 years]; Zoladex  today q3M. BIL Mammogram August 2024 [Dr.Piscoya]- WNL. Currently on Letrozole - in JULY 2024[facility]]  # No clinical evidence of recurrence-  stable; will refill letrozole  [plan extended AI until- 2029].   # Mild Anemia- Hb 11-12-  Hx of colonoscopy- 2022 ? S/p colonoscopy [nov 2023;  Dr.Vanga. APRIL 2025-; sat-20%.; continue linzesscontinue PO gentle iron. - stable;   # Depression/ Insomnia--cymblata/welbutrin/ buspar - OFF Amiytrplitine qhs /Dr.vaslow[DI-none as per uptodate*] not well controlled.  refer to psychiatry- Dr.Reina Hisada re: depression  # Bone health-JUNE 2025-- BMD T score:Normal-. Joint pains-multifactorial-continue ca + vit D- s/p adjuvant zometa - x 6 [July 2025]- stable.    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   # Lymphedema-left chest walls/p physical therapy- stable.   # DM-2 on insulin - poorly controlled  [A1c-6.8] Blood glucose - stable.   # Port  malfunction: flush; s/p TPA-- # port/IV access- Stable  Zometa  q 26m- last in July 2025; zola 3 m- stop 11/29/22  # DISPOSITION: # refer to psychiatry- Dr.Reina Hisada re: depression # port flush in 3 month # Zometa  today- wait for CMP # Follow up in 6 month; MD;port/ cbc/cmp;iron  ferritin- ca-27-29; vit D 25-- Dr.B    All questions were answered. The patient knows to call the clinic with any problems, questions or concerns.    Carolyn JONELLE Joe, MD 08/29/2023 3:23 PM

## 2023-08-29 NOTE — Progress Notes (Signed)
 Per Dr. KATHEE disposition, referral placed for psychiatry- Dr.Reina Hisada re: depression.

## 2023-08-29 NOTE — Assessment & Plan Note (Addendum)
#   May 2019- HIGH RISK- LEFT  Breast cancer Stage III ER/PR pos her 2 NEG on OFS+ Letrozole . Also on -Adjuvant Zometa  q6M [June 2022 x3 years]; Zoladex  today q3M. BIL Mammogram August 2024 [Dr.Piscoya]- WNL. Currently on Letrozole - in JULY 2024[facility]]  # No clinical evidence of recurrence-  stable; will refill letrozole  [plan extended AI until- 2029].   # Mild Anemia- Hb 11-12-  Hx of colonoscopy- 2022 ? S/p colonoscopy [nov 2023;  Dr.Vanga. APRIL 2025-; sat-20%.; continue linzesscontinue PO gentle iron. - stable;   # Depression/ Insomnia--cymblata/welbutrin/ buspar - OFF Amiytrplitine qhs /Dr.vaslow[DI-none as per uptodate*] not well controlled.  refer to psychiatry- Dr.Reina Hisada re: depression  # Bone health-JUNE 2025-- BMD T score:Normal-. Joint pains-multifactorial-continue ca + vit D- s/p adjuvant zometa - x 6 [July 2025]- stable.    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   # Lymphedema-left chest walls/p physical therapy- stable.   # DM-2 on insulin - poorly controlled  [A1c-6.8] Blood glucose - stable.   # Port malfunction: flush; s/p TPA-- # port/IV access- Stable  Zometa  q 41m- last in July 2025; zola 3 m- stop 11/29/22  # DISPOSITION: # refer to psychiatry- Dr.Reina Hisada re: depression # port flush in 3 month # Zometa  today- wait for CMP # Follow up in 6 month; MD;port/ cbc/cmp;iron  ferritin- ca-27-29; vit D 25-- Dr.B

## 2023-08-30 LAB — CANCER ANTIGEN 27.29: CA 27.29: <9 U/mL (ref 0.0–38.6)

## 2023-08-30 LAB — ESTRADIOL: Estradiol: <5 pg/mL

## 2023-08-30 LAB — FSH/LH
FSH: 5.3 m[IU]/mL
LH: 1.4 m[IU]/mL

## 2023-09-01 LAB — HM DIABETES EYE EXAM

## 2023-09-01 NOTE — Addendum Note (Signed)
 Addended by: LAEL BROWNING A on: 09/01/2023 02:01 PM   Modules accepted: Orders

## 2023-09-07 ENCOUNTER — Other Ambulatory Visit: Payer: Self-pay

## 2023-09-07 NOTE — Progress Notes (Signed)
   09/07/2023  Patient ID: Carolyn Roy, female   DOB: 09-29-1965, 58 y.o.   MRN: [id number]  Subjective/Objective Telephone visit to follow-up on management of diabetes    Diabetes: Current medications: metformin  1000mg  BID, Lantus  60 units daily, Farxiga  10mg  daily -Medications tried in the past: did not tolerate GLP1 medications well previously -Last A1c 10.3%, up from 7.6%- patient was out of Januvia  approximately 3 weeks prior to recent A1c -Using Libre 3+ for CGM; data from the past 14 weeks: CGM active 73% of the time Avg BG 187 GMI 7.8% Variability 33.8% In Range 46% Low 1% High 37% Very High 16% -Patient states husband may have placed sensor in wrong area, which she believes may be leading to falsely elevated readings   Assessment/Plan:    Diabetes: -Currently uncontrolled -Continue current regimen at this time -Libre 3+ sensor will be changed 8/9 -If blood glucose continues to be elevated, specifically after eating, I recommend addition of meal time insulin - if patient is opposed, we could try sulfonylurea instead   Follow-up:  4 weeks   Carolyn Roy, PharmD, DPLA

## 2023-09-28 ENCOUNTER — Other Ambulatory Visit: Payer: Self-pay | Admitting: Nurse Practitioner

## 2023-09-28 NOTE — Telephone Encounter (Signed)
 Requested medications are due for refill today.  yes  Requested medications are on the active medications list.  yes  Last refill. 04/03/2023 #90 1 rf  Future visit scheduled.   yes  Notes to clinic.  Abnormal labs.    Requested Prescriptions  Pending Prescriptions Disp Refills   meloxicam  (MOBIC ) 15 MG tablet [Pharmacy Med Name: MELOXICAM  15 MG TABLET] 90 tablet 1    Sig: Take 1 tablet (15 mg total) by mouth daily.     Analgesics:  COX2 Inhibitors Failed - 09/28/2023  5:17 PM      Failed - Manual Review: Labs are only required if the patient has taken medication for more than 8 weeks.      Failed - HGB in normal range and within 360 days    Hemoglobin  Date Value Ref Range Status  08/29/2023 11.6 (L) 12.0 - 15.0 g/dL Final  88/85/7977 85.0 11.1 - 15.9 g/dL Final         Passed - Cr in normal range and within 360 days    Creatinine  Date Value Ref Range Status  08/29/2023 0.96 0.44 - 1.00 mg/dL Final         Passed - HCT in normal range and within 360 days    HCT  Date Value Ref Range Status  08/29/2023 36.3 36.0 - 46.0 % Final   Hematocrit  Date Value Ref Range Status  12/21/2020 44.8 34.0 - 46.6 % Final         Passed - AST in normal range and within 360 days    AST  Date Value Ref Range Status  08/29/2023 19 15 - 41 U/L Final         Passed - ALT in normal range and within 360 days    ALT  Date Value Ref Range Status  08/29/2023 20 0 - 44 U/L Final         Passed - eGFR is 30 or above and within 360 days    GFR calc Af Amer  Date Value Ref Range Status  10/21/2019 >60 >60 mL/min Final   GFR, Estimated  Date Value Ref Range Status  08/29/2023 >60 >60 mL/min Final    Comment:    (NOTE) Calculated using the CKD-EPI Creatinine Equation (2021)    eGFR  Date Value Ref Range Status  07/05/2023 86 >59 mL/min/1.73 Final         Passed - Patient is not pregnant      Passed - Valid encounter within last 12 months    Recent Outpatient Visits            1 month ago Type 2 diabetes mellitus with hyperglycemia, with long-term current use of insulin  (HCC)   [facility] [place], Eudora T, NP   2 months ago Type 2 diabetes mellitus with hyperglycemia, with long-term current use of insulin  (HCC)   [facility] [place], [place] T, NP   5 months ago Type 2 diabetes mellitus with hyperglycemia, with long-term current use of insulin  [facility])   [facility] [place], Melanie DASEN, NP

## 2023-10-05 ENCOUNTER — Other Ambulatory Visit: Payer: Self-pay

## 2023-10-05 NOTE — Progress Notes (Unsigned)
   10/05/2023  Patient ID: Carolyn Roy, female   DOB: [birth year], 58 y.o.   MRN: [id number]  Outreach attempt for scheduled telephone visit was unsuccessful, but I was able to leave a HIPAA compliant voicemail with my direct phone number.  I am also sending a MyChart message to attempt to reschedule visit.  Carolyn Roy, PharmD, DPLA

## 2023-10-07 ENCOUNTER — Other Ambulatory Visit: Payer: Self-pay | Admitting: Nurse Practitioner

## 2023-10-10 NOTE — Telephone Encounter (Signed)
 Unable to refill per protocol, Rx expired. Discontinued 01/12/23, not covered by insurance.  Requested Prescriptions  Pending Prescriptions Disp Refills   LANTUS  SOLOSTAR 100 UNIT/ML Solostar Pen [Pharmacy Med Name: LANTUS  SOLOSTAR 100 UNIT/ML]  3    Sig: INJECT 45 UNITS INTO THE SKIN AT BEDTIME.     Endocrinology:  Diabetes - Insulins Failed - 10/10/2023 11:06 AM      Failed - HBA1C is between 0 and 7.9 and within 180 days    HB A1C (BAYER DCA - WAIVED)  Date Value Ref Range Status  07/05/2023 10.3 (H) 4.8 - 5.6 % Final    Comment:             Prediabetes: 5.7 - 6.4          Diabetes: >6.4          Glycemic control for adults with diabetes: <7.0          Passed - Valid encounter within last 6 months    Recent Outpatient Visits           1 month ago Type 2 diabetes mellitus with hyperglycemia, with long-term current use of insulin  (HCC)   [facility] [place], [place] T, NP   3 months ago Type 2 diabetes mellitus with hyperglycemia, with long-term current use of insulin  (HCC)   [facility] [place], [place] T, NP   6 months ago Type 2 diabetes mellitus with hyperglycemia, with long-term current use of insulin  [facility])   [facility] [place], Melanie DASEN, NP

## 2023-10-13 NOTE — Progress Notes (Signed)
   10/13/2023  Patient ID: Carolyn Roy, female   DOB: 1965/05/25, 58 y.o.   MRN: [id number]  Subjective/Objective I have not been able to successfully follow-up with patient since our last telephone visit 7/31; patient sees PCP again 9/8, so sending assessment and recommendations in regard to diabetes control  Diabetes -Current medications:  metformin  1000mg  BID, Januvia  100mg  daily, Farxiga  10mg  daily, Lantus  60units daily  -Patient is using Libre 3+ for CGM and data is linked to [employer].  Information from the past 2 weeks: Time CGM active 47% Average Glucose 250 GMI 9.3% Glucose Variability 33.7% Time in Ranges: Very High 48%, High 29%, Target 23%, no low/very low Patterns observed:  BG consistently high from approximately 10am-3am -Recent fill history: Farxiga  10mg  30 day supply picked up 8/30, 7/27, 6/30, 5/29 Januvia  100mg  30 day supply picked up 8/30, 7/27, 6/19. 4/28 Lantus  90 day supply picked up 6/8, 3/7- day supply was calculated based on patient using 45 units daily (now using 60 units) Libre 3+ Sensors 30 day supply pick up 9/1, 7/27, 6/21 Metformin  1000mg  80 day supply picked up 4/14 -Patient did not tolerate GLP1 medications, but it appears only Trulicity  and Ozempic  have been tried previously -Last A1c was 10.3% 5/28  Assessment/Plan -Uncontrolled with A1c >7% -CGM data reflects consistent hyperglycemia; this has increased significantly from June/July averages -Fill history reflects good adherence to Farxiga  and Januvia  but not metformin  and Lantus  -Sees PCP again Monday and will be due for A1c -Identify any barriers to adherence in regard to Lantus  and metformin  -I recommend stopping Januvia  and revisiting GLP1 such as Mounjaro or Rybelsus  (I do not see these have been used in the past).  Test claims reflect $14.99 copay for Rybelsus  or $25 for [facility]  Follow-up:  I will attempt to check in with patient 4 weeks after upcoming PCP visit  Carolyn Roy, PharmD, DPLA

## 2023-10-14 NOTE — Patient Instructions (Incomplete)

## 2023-10-16 ENCOUNTER — Encounter: Payer: Self-pay | Admitting: Nurse Practitioner

## 2023-10-16 ENCOUNTER — Ambulatory Visit: Admitting: Nurse Practitioner

## 2023-10-16 ENCOUNTER — Telehealth: Payer: Self-pay

## 2023-10-16 VITALS — BP 110/75 | HR 84 | Temp 98.7°F | Resp 16 | Ht 64.02 in | Wt 196.4 lb

## 2023-10-16 DIAGNOSIS — Z794 Long term (current) use of insulin: Secondary | ICD-10-CM

## 2023-10-16 DIAGNOSIS — I951 Orthostatic hypotension: Secondary | ICD-10-CM

## 2023-10-16 DIAGNOSIS — Z23 Encounter for immunization: Secondary | ICD-10-CM | POA: Diagnosis not present

## 2023-10-16 DIAGNOSIS — F322 Major depressive disorder, single episode, severe without psychotic features: Secondary | ICD-10-CM

## 2023-10-16 DIAGNOSIS — G620 Drug-induced polyneuropathy: Secondary | ICD-10-CM | POA: Diagnosis not present

## 2023-10-16 DIAGNOSIS — E1165 Type 2 diabetes mellitus with hyperglycemia: Secondary | ICD-10-CM | POA: Diagnosis not present

## 2023-10-16 DIAGNOSIS — C50512 Malignant neoplasm of lower-outer quadrant of left female breast: Secondary | ICD-10-CM

## 2023-10-16 DIAGNOSIS — E1169 Type 2 diabetes mellitus with other specified complication: Secondary | ICD-10-CM | POA: Diagnosis not present

## 2023-10-16 DIAGNOSIS — F1120 Opioid dependence, uncomplicated: Secondary | ICD-10-CM

## 2023-10-16 DIAGNOSIS — F19982 Other psychoactive substance use, unspecified with psychoactive substance-induced sleep disorder: Secondary | ICD-10-CM

## 2023-10-16 DIAGNOSIS — E66812 Obesity, class 2: Secondary | ICD-10-CM

## 2023-10-16 DIAGNOSIS — F17219 Nicotine dependence, cigarettes, with unspecified nicotine-induced disorders: Secondary | ICD-10-CM

## 2023-10-16 DIAGNOSIS — Z170 Estrogen receptor positive status [ER+]: Secondary | ICD-10-CM

## 2023-10-16 LAB — BAYER DCA HB A1C WAIVED: HB A1C (BAYER DCA - WAIVED): 8 % — ABNORMAL HIGH (ref 4.8–5.6)

## 2023-10-16 MED ORDER — INSULIN GLARGINE 100 UNIT/ML ~~LOC~~ SOLN: 60.0000 [IU] | Freq: Every day | SUBCUTANEOUS | 5 refills | Status: DC

## 2023-10-16 MED ORDER — RYBELSUS 3 MG PO TABS: 3.0000 mg | ORAL_TABLET | Freq: Every day | ORAL | Status: DC

## 2023-10-16 MED ORDER — METFORMIN HCL 1000 MG PO TABS: 1000.0000 mg | ORAL_TABLET | Freq: Two times a day (BID) | ORAL | 3 refills | Status: AC

## 2023-10-16 MED ORDER — ROSUVASTATIN CALCIUM 40 MG PO TABS: 40.0000 mg | ORAL_TABLET | Freq: Every day | ORAL | 4 refills | Status: AC

## 2023-10-16 MED ORDER — RYBELSUS 7 MG PO TABS: 7.0000 mg | ORAL_TABLET | Freq: Every day | ORAL | 5 refills | Status: DC

## 2023-10-16 NOTE — Assessment & Plan Note (Signed)
 Chronic, ongoing. Currently denies SI/HI. Taking Duloxetine , continue this, plus finding benefit from Wellbutrin  XL and Buspar .  Could consider increase in Duloxetine , which discussed with her today and educated.  Recommend she schedule f/u with Dr. Chipper as needed in future if worsening mood.  At this time she reports being stable, going through grieving process with loss of job and step son.

## 2023-10-16 NOTE — Assessment & Plan Note (Signed)
 Chronic, stable.  Will continue Gabapentin , other medications caused too much fatigue.  Benefit to sleep and to migraines and chronic pain.

## 2023-10-16 NOTE — Progress Notes (Signed)
   10/16/2023  Patient ID: Carolyn Roy, female   DOB: [birth year], 58 y.o.   MRN: [id number]  Christinia message from PCP to inform me patient was seen today, and A1c has improved to 8%; this is down from 10.3% in May.  Januvia  has been stopped, and patient was provided with Rybelsus  3mg  sample; prescription for 7mg  [facility] along with updated Lantus  script for 60 units daily and metformin  refill.  Patient mentioned she was paying $39.99 for [facility] sensors with insurance + copay card, but this has recently gone up to $74.99.  Contacted [facility], and Rybelsus  7mg  and metformin  are ready for pick up for a total of $22.80.  They are also filling Lantus , which will be ready later today or tomorrow for $0 copay.  Pharmacy states they have still been submitting Libre sensors to insurance and copay card provided; unclear why cost has increased, but I have messaged our [facility] rep to see if something changed with the copay cards.  I am sending patient a message with the above information and to attempt to schedule a telephone follow-up in 2 weeks to see how she is doing with Rybelsus  and check on home BG readings.  Carolyn Roy, PharmD, DPLA

## 2023-10-16 NOTE — Assessment & Plan Note (Signed)
 Ongoing, stable. Recommend to continue to take plenty of water intake daily and add some salt to diet + wear compression on during day and off at night. Has seen cardiology with overall stable work-up. Would avoid HTN medications.

## 2023-10-16 NOTE — Assessment & Plan Note (Signed)
 Quit in November 2024 with Wellbutrin .  Will increase Wellbutrin  XL to 300 MG daily for mood and smoking cessation.  Is doing well with this. Lung cancer screening ordered and will see if eligible.

## 2023-10-16 NOTE — Assessment & Plan Note (Addendum)
 Ongoing, stable.  Continue collaboration with oncology, recent note reviewed. Port remains in place, but no treatments.

## 2023-10-16 NOTE — Progress Notes (Signed)
 BP 110/75 (BP Location: Left Arm, Patient Position: Sitting, Cuff Size: Normal)   Pulse 84   Temp 98.7 F (37.1 C) (Oral)   Resp 16   Ht 5' 4.02 (1.626 m)   Wt 196 lb 6.4 oz (89.1 kg)   LMP  (LMP Unknown) Comment: LAST PERIOD IN MAY 2019 WHEN SHE STARTED CHEMO  SpO2 99%   BMI 33.70 kg/m    Subjective:    Patient ID: Carolyn Roy, female    DOB: 04/25/1965, 58 y.o.   MRN: [id number]  HPI: Carolyn Roy is a 58 y.o. female  Chief Complaint  Patient presents with   Diabetes    Uses Libre 3 Cgm.    Hypertension   Hyperlipidemia   Mood    Half and half. No longer working and feeling bored.    DIABETES May A1c 10.3%. Continues Metformin  XR 1000 MG BID (has not taken in one month, rain out), Januvia  100 MG, Farxiga  10 MG daily, and Lantus  60 units daily (pharmacy would not fill recently and she is on last pen). Reports A1c will be elevated due family loss and no longer working -- workmen's comp and personal injury claims.  Has been eating more because of this, but is working on cutting back.  Currently is having to feed the whole family which is stressful. Working with Channing PharmD on savings and costs.  Recently was told her CGM will be $76, was $39 with coupon. Freestyle: Average glucose 188, 48% in range/2% low/22 to 28% high levels.  On review trends up around 6 pm. Hypoglycemic episodes:no Polydipsia/polyuria: no Visual disturbance: no Chest pain: no Paresthesias: no Glucose Monitoring: wearing CGM  Accucheck frequency:   Fasting glucose:   Post prandial:   Evening:   Before meals:  Taking Insulin ?: yes  Long acting insulin : 60 units  Short acting insulin : Blood Pressure Monitoring: not checking Retinal Examination: Up To Date -- Dr. Mevelyn New Exam: Up to Date Pneumovax: Up To Date Influenza: Up To Date Aspirin : no   HYPERLIPIDEMIA Taking Rosuvastatin  40 MG daily. Cardiology seen in 2023 for hypotension, reassuring work-up. Quit smoking  cigarettes at the end of November 2024, but then started vaping.  In past did quit for 6 years, but then started back.  Started smoking at age 67, smoked >35 years total. Satisfied with current treatment? yes Duration of hyperlipidemia: chronic Cholesterol medication side effects: no Cholesterol supplements: none Aspirin : no Recent stressors: no Recurrent headaches: no Visual changes: no Palpitations: no Dyspnea: no Chest pain: no Lower extremity edema: no Dizzy/lightheaded: no  The ASCVD Risk score (Arnett DK, et al., 2019) failed to calculate for the following reasons:   The valid total cholesterol range is 130 to 320 mg/dL   CHRONIC PAIN  Last saw pain clinic in July 2025.  Takes Oxycodone  and Gabapentin . Pain control status: stable Duration: chronic Location: back, knees, and hands Quality: dull, aching, and throbbing Current Pain Level: 1/10 Previous Pain Level: 10/10 Breakthrough pain: no Benefit from narcotic medications: yes What Activities task can be accomplished with current medication? Able to work and perform ADLs Interested in weaning off narcotics:no   Stool softners/OTC fiber: yes  Previous pain specialty evaluation: yes Non-narcotic analgesic meds: no Narcotic contract: yes   DEPRESSION Taking Wellbutrin  and Duloxetine .  In the past she followed with Dr. Chipper (psychiatry), last visit was 10/25/21.  Had loss of youngest step son of family, 70 years old, passed in March 2025 + recent job loss.  Took Amitriptyline  in past, but this made her too tired.  Breast cancer history, last saw oncology 08/29/23.  Was receiving infusions, every 3-6 months in past. Does get Zometa  for bone density.  Still has port in place. Mood status: stable  Satisfied with current treatment?: yes Symptom severity: stable to mild  Duration of current treatment : chronic Side effects: no Medication compliance: good compliance Psychotherapy/counseling: none Depressed mood: yes Anxious  mood: yes Anhedonia: no Significant weight loss or gain: no Insomnia: trouble staying asleep Fatigue: sometimes Feelings of worthlessness or guilt: no Impaired concentration/indecisiveness: no Suicidal ideations: no Hopelessness: no Crying spells: yes    10/16/2023   10:33 AM 08/29/2023    2:48 PM 08/21/2023   11:57 AM 07/05/2023    9:13 AM 05/22/2023   11:36 AM  Depression screen PHQ 2/9  Decreased Interest 1 0 0 1 3  Down, Depressed, Hopeless 0 0 0 1 3  PHQ - 2 Score 1 0 0 2 6  Altered sleeping 2   1   Tired, decreased energy 1   1   Change in appetite 3   2   Feeling bad or failure about yourself  0   0   Trouble concentrating 1   1   Moving slowly or fidgety/restless 3   0   Suicidal thoughts 0   0   PHQ-9 Score 11   7   Difficult doing work/chores    Somewhat difficult       10/16/2023   10:33 AM 07/05/2023    9:13 AM 04/03/2023    8:30 AM 01/03/2023    8:55 AM  GAD 7 : Generalized Anxiety Score  Nervous, Anxious, on Edge 1 2 3 3   Control/stop worrying 1 1 1 3   Worry too much - different things 2 1 2 2   Trouble relaxing 2 2 1 2   Restless 2 3 3 2   Easily annoyed or irritable 3 3 1 3   Afraid - awful might happen 0 3 2 3   Total GAD 7 Score 11 15 13 18   Anxiety Difficulty  Somewhat difficult Somewhat difficult Somewhat difficult   Relevant past medical, surgical, family and social history reviewed and updated as indicated. Interim medical history since our last visit reviewed. Allergies and medications reviewed and updated.  Review of Systems  Constitutional:  Negative for activity change, appetite change, fatigue and fever.  Respiratory:  Negative for cough, chest tightness and shortness of breath.   Cardiovascular:  Negative for chest pain, palpitations and leg swelling.  Gastrointestinal: Negative.   Endocrine: Negative for cold intolerance, heat intolerance, polydipsia, polyphagia and polyuria.  Genitourinary:  Negative for difficulty urinating.  Musculoskeletal:   Negative for myalgias.  Neurological: Negative.  Negative for dizziness and light-headedness.  Psychiatric/Behavioral:  Negative for decreased concentration, self-injury, sleep disturbance and suicidal ideas. The patient is nervous/anxious.    Per HPI unless specifically indicated above     Objective:    BP 110/75 (BP Location: Left Arm, Patient Position: Sitting, Cuff Size: Normal)   Pulse 84   Temp 98.7 F (37.1 C) (Oral)   Resp 16   Ht 5' 4.02 (1.626 m)   Wt 196 lb 6.4 oz (89.1 kg)   LMP  (LMP Unknown) Comment: LAST PERIOD IN MAY 2019 WHEN SHE STARTED CHEMO  SpO2 99%   BMI 33.70 kg/m   Wt Readings from Last 3 Encounters:  10/16/23 196 lb 6.4 oz (89.1 kg)  08/29/23 196 lb 11.2 oz (89.2 kg)  08/21/23 192 lb (87.1 kg)    Physical Exam Vitals and nursing note reviewed.  Constitutional:      General: She is awake. She is not in acute distress.    Appearance: Normal appearance. She is well-developed and well-groomed. She is obese. She is not ill-appearing or toxic-appearing.  HENT:     Head: Normocephalic.     Right Ear: Hearing and external ear normal.     Left Ear: Hearing and external ear normal.  Eyes:     General: Lids are normal.        Right eye: No discharge.        Left eye: No discharge.     Conjunctiva/sclera: Conjunctivae normal.     Pupils: Pupils are equal, round, and reactive to light.  Neck:     Thyroid : No thyromegaly.     Vascular: No carotid bruit.  Cardiovascular:     Rate and Rhythm: Normal rate and regular rhythm.     Heart sounds: Normal heart sounds. No murmur heard.    No gallop.  Pulmonary:     Effort: Pulmonary effort is normal. No accessory muscle usage or respiratory distress.     Breath sounds: Normal breath sounds.  Abdominal:     General: Bowel sounds are normal. There is no distension.     Palpations: Abdomen is soft.     Tenderness: There is no abdominal tenderness.  Musculoskeletal:     Cervical back: Normal range of motion and  neck supple.     Right lower leg: No edema.     Left lower leg: No edema.  Lymphadenopathy:     Cervical: No cervical adenopathy.  Skin:    General: Skin is warm and dry.  Neurological:     Mental Status: She is alert and oriented to person, place, and time.     Deep Tendon Reflexes: Reflexes are normal and symmetric.     Reflex Scores:      Brachioradialis reflexes are 2+ on the right side and 2+ on the left side.      Patellar reflexes are 2+ on the right side and 2+ on the left side. Psychiatric:        Attention and Perception: Attention normal.        Mood and Affect: Mood normal.        Speech: Speech normal.        Behavior: Behavior normal. Behavior is cooperative.        Thought Content: Thought content normal.    Results for orders placed or performed in visit on 10/16/23  Bayer DCA Hb A1c Waived   Collection Time: 10/16/23 10:40 AM  Result Value Ref Range   HB A1C (BAYER DCA - WAIVED) 8.0 (H) 4.8 - 5.6 %      Assessment & Plan:   Problem List Items Addressed This Visit       Cardiovascular and Mediastinum   Hypotension   Ongoing, stable. Recommend to continue to take plenty of water intake daily and add some salt to diet + wear compression on during day and off at night. Has seen cardiology with overall stable work-up. Would avoid HTN medications.      Relevant Medications   rosuvastatin  (CRESTOR ) 40 MG tablet     Endocrine   Type 2 diabetes mellitus with hyperglycemia, with long-term current use of insulin  (HCC) - Primary   Chronic, ongoing.  A1c 10.3% at end of May, due to poor diet and stressors.  A1c  is trending down to 8% today.  Feeding whole family and healthier items are costly.  Getting assistance with Farxiga . Appreciate Channing PharmD with [facility] for her assistance, recent recommendations reviewed and discussed with patient. Patient does endorse being out of Metformin  for one month, but plans to pick up.  Pharmacy is not filling Lantus , new script with  current dose sent in today.  Sugars are trending down. - Will trial stopping Januvia  and changing to Rybelsus . Educated her on this.  She did not tolerate Trulicity  in past, but may tolerate oral GLP1.  Sample of 3 MG provider in office today and future order for 7 MG placed. - Continue Lantus  60 units and back down to 55 units if too many lows <70 with this.  -  Will continue Metformin  at current dosing, recommend she pick up.  Continue Farxiga . -  Recommend monitor BS at home TID and heavily focus on diet changes.  -  Urine ALB 150 February 2025, can not take ACE/ARB due to hypotension.  Statin on board.   -  Eye exam and foot exam up to date. -  Return in 3 months.  Will have return to endo if note ongoing poor control, but suspect with current team approach with PharmD and PCP + patient will be able to get A1c to goal.      Relevant Medications   insulin  glargine (LANTUS ) 100 UNIT/ML injection   metFORMIN  (GLUCOPHAGE ) 1000 MG tablet   Semaglutide  (RYBELSUS ) 7 MG TABS (Start on 11/17/2023)   Semaglutide  (RYBELSUS ) 3 MG TABS   rosuvastatin  (CRESTOR ) 40 MG tablet   Other Relevant Orders   Bayer DCA Hb A1c Waived (Completed)   Hyperlipidemia associated with type 2 diabetes mellitus (HCC)   Chronic, ongoing.  Continue Rosuvastatin  40 MG daily.  Lipid panel today and adjust dose as needed.      Relevant Medications   insulin  glargine (LANTUS ) 100 UNIT/ML injection   metFORMIN  (GLUCOPHAGE ) 1000 MG tablet   Semaglutide  (RYBELSUS ) 7 MG TABS (Start on 11/17/2023)   Semaglutide  (RYBELSUS ) 3 MG TABS   rosuvastatin  (CRESTOR ) 40 MG tablet   Other Relevant Orders   Bayer DCA Hb A1c Waived (Completed)   Comprehensive metabolic panel with GFR   Lipid Panel w/o Chol/HDL Ratio     Nervous and Auditory   Chemotherapy-induced neuropathy (HCC) (Chronic)   Chronic, ongoing.  Continue current pain management collaboration and medication regimen as prescribed by them. Continue Gabapentin  300 MG in  morning and 600 MG in the evening, to offer more benefit to neuropathy.  Recent eGFR stable.      Nicotine dependence, cigarettes, w unsp disorders   Quit in November 2024 with Wellbutrin .  Will increase Wellbutrin  XL to 300 MG daily for mood and smoking cessation.  Is doing well with this. Lung cancer screening ordered and will see if eligible.      Relevant Orders   Ambulatory Referral for Lung Cancer Scre     Other   Uncomplicated opioid dependence (HCC) (Chronic)   Chronic, stable.  Followed by pain management, continue this collaboration, recent notes reviewed.      Severe depression (HCC)   Chronic, ongoing. Currently denies SI/HI. Taking Duloxetine , continue this, plus finding benefit from Wellbutrin  XL and Buspar .  Could consider increase in Duloxetine , which discussed with her today and educated.  Recommend she schedule f/u with Dr. Chipper as needed in future if worsening mood.  At this time she reports being stable, going through grieving process with  loss of job and step son.      Obesity   BMI 33.70, with T2DM, HTN, HLD  Recommended eating smaller high protein, low fat meals more frequently and exercising 30 mins a day 5 times a week with a goal of 10-15lb weight loss in the next 3 months. Patient voiced their understanding and motivation to adhere to these recommendations.       Relevant Medications   insulin  glargine (LANTUS ) 100 UNIT/ML injection   metFORMIN  (GLUCOPHAGE ) 1000 MG tablet   Semaglutide  (RYBELSUS ) 7 MG TABS (Start on 11/17/2023)   Semaglutide  (RYBELSUS ) 3 MG TABS   Malignant neoplasm of lower-outer quadrant of left breast of female, estrogen receptor positive (HCC)   Ongoing, stable.  Continue collaboration with oncology, recent note reviewed. Port remains in place, but no treatments.      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  Will continue Gabapentin , other medications caused too much fatigue.  Benefit to sleep and to migraines and chronic pain.         Other Visit Diagnoses       Pneumococcal vaccination given       PCV20 today, educated on this.   Relevant Orders   Pneumococcal conjugate vaccine 20-valent (Completed)     Flu vaccine need       Flu vaccine today, educated on this.   Relevant Orders   Flu vaccine trivalent PF, 6mos and older(Flulaval,Afluria,Fluarix,Fluzone) (Completed)        Follow up plan: Return in about 4 weeks (around 11/13/2023) for T2DM -- changed to Rybelsus  and stop Januvia .

## 2023-10-16 NOTE — Assessment & Plan Note (Signed)
 Chronic, stable.  Followed by pain management, continue this collaboration, recent notes reviewed.

## 2023-10-16 NOTE — Assessment & Plan Note (Signed)
 Chronic, ongoing.  A1c 10.3% at end of May, due to poor diet and stressors.  A1c is trending down to 8% today.  Feeding whole family and healthier items are costly.  Getting assistance with Farxiga . Appreciate Channing PharmD with [facility] for her assistance, recent recommendations reviewed and discussed with patient. Patient does endorse being out of Metformin  for one month, but plans to pick up.  Pharmacy is not filling Lantus , new script with current dose sent in today.  Sugars are trending down. - Will trial stopping Januvia  and changing to Rybelsus . Educated her on this.  She did not tolerate Trulicity  in past, but may tolerate oral GLP1.  Sample of 3 MG provider in office today and future order for 7 MG placed. - Continue Lantus  60 units and back down to 55 units if too many lows <70 with this.  -  Will continue Metformin  at current dosing, recommend she pick up.  Continue Farxiga . -  Recommend monitor BS at home TID and heavily focus on diet changes.  -  Urine ALB 150 February 2025, can not take ACE/ARB due to hypotension.  Statin on board.   -  Eye exam and foot exam up to date. -  Return in 3 months.  Will have return to endo if note ongoing poor control, but suspect with current team approach with PharmD and PCP + patient will be able to get A1c to goal.

## 2023-10-16 NOTE — Assessment & Plan Note (Signed)
 Chronic, ongoing.  Continue current pain management collaboration and medication regimen as prescribed by them. Continue Gabapentin  300 MG in morning and 600 MG in the evening, to offer more benefit to neuropathy.  Recent eGFR stable.

## 2023-10-16 NOTE — Assessment & Plan Note (Signed)
 BMI 33.70, with T2DM, HTN, HLD  Recommended eating smaller high protein, low fat meals more frequently and exercising 30 mins a day 5 times a week with a goal of 10-15lb weight loss in the next 3 months. Patient voiced their understanding and motivation to adhere to these recommendations.

## 2023-10-16 NOTE — Assessment & Plan Note (Signed)
 Chronic, ongoing.  Continue Rosuvastatin 40 MG daily.  Lipid panel today and adjust dose as needed.

## 2023-10-17 ENCOUNTER — Ambulatory Visit: Payer: Self-pay | Admitting: Nurse Practitioner

## 2023-10-17 LAB — COMPREHENSIVE METABOLIC PANEL WITH GFR
ALT: 18 IU/L (ref 0–32)
AST: 16 IU/L (ref 0–40)
Albumin: 4 g/dL (ref 3.8–4.9)
Alkaline Phosphatase: 108 IU/L (ref 44–121)
BUN/Creatinine Ratio: 10 (ref 9–23)
BUN: 11 mg/dL (ref 6–24)
Bilirubin Total: 0.3 mg/dL (ref 0.0–1.2)
CO2: 24 mmol/L (ref 20–29)
Calcium: 9.4 mg/dL (ref 8.7–10.2)
Chloride: 102 mmol/L (ref 96–106)
Creatinine, Ser: 1.05 mg/dL — ABNORMAL HIGH (ref 0.57–1.00)
Globulin, Total: 2.3 g/dL (ref 1.5–4.5)
Glucose: 237 mg/dL — ABNORMAL HIGH (ref 70–99)
Potassium: 4.3 mmol/L (ref 3.5–5.2)
Sodium: 139 mmol/L (ref 134–144)
Total Protein: 6.3 g/dL (ref 6.0–8.5)
eGFR: 62 mL/min/1.73 (ref >59)

## 2023-10-17 LAB — LIPID PANEL W/O CHOL/HDL RATIO
Cholesterol, Total: 227 mg/dL — ABNORMAL HIGH (ref 100–199)
HDL: 51 mg/dL (ref >39)
LDL Chol Calc (NIH): 142 mg/dL — ABNORMAL HIGH (ref 0–99)
Triglycerides: 187 mg/dL — ABNORMAL HIGH (ref 0–149)
VLDL Cholesterol Cal: 34 mg/dL (ref 5–40)

## 2023-10-17 NOTE — Progress Notes (Signed)
 Contacted via MyChart - please call to make sure she saw these though, as does not always check:   Good evening Marleta, your labs have returned: - Kidney and liver function are normal. - Lipid panel levels have trended up quite a bit.  Have you been taking your Rosuvastatin ? Please let me know.  Ensure you are taking this daily as ordered.  Your LDL was 47 last check, which is at goal for stroke prevention, but now is 142. Any questions? Keep being stellar!!  Thank you for allowing me to participate in your care.  I appreciate you. Kindest regards, Serapio Edelson

## 2023-10-22 ENCOUNTER — Other Ambulatory Visit: Payer: Self-pay | Admitting: Nurse Practitioner

## 2023-10-25 NOTE — Progress Notes (Signed)
 Last read by Dorothyann DELENA Fitch at 2:08PM on 10/23/2023.

## 2023-11-03 ENCOUNTER — Telehealth: Payer: Self-pay

## 2023-11-03 NOTE — Progress Notes (Signed)
   11/03/2023  Patient ID: Carolyn Roy, female   DOB: 21-Feb-1965, 58 y.o.   MRN: [id number]  Outreach attempt to schedule a telephone visit to follow-up on management of diabetes.  I was not able to reach the patient but did leave a HIPAA compliant voicemail with my direct number.  I will try to call Ms. Greeson again next week if I do not hear back.SABRA Channing DELENA Deanna, PharmD, DPLA

## 2023-11-09 ENCOUNTER — Telehealth: Payer: Self-pay

## 2023-11-09 NOTE — Progress Notes (Signed)
   11/09/2023  Patient ID: Carolyn Roy, female   DOB: May 07, 1965, 58 y.o.   MRN: [id number]  Outreach attempt to schedule telephone follow-up to address management of diabetes was not successful, but I was able to leave a HIPAA compliant voicemail with my direct phone number.  I will try to call her again next week if I do not hear back.  [name], PharmD, DPLA

## 2023-11-11 NOTE — Patient Instructions (Incomplete)

## 2023-11-15 ENCOUNTER — Other Ambulatory Visit: Payer: Self-pay | Admitting: Nurse Practitioner

## 2023-11-15 ENCOUNTER — Ambulatory Visit: Admitting: Nurse Practitioner

## 2023-11-17 ENCOUNTER — Telehealth: Payer: Self-pay | Admitting: Pharmacy Technician

## 2023-11-17 ENCOUNTER — Telehealth: Payer: Self-pay

## 2023-11-17 MED ORDER — LANTUS SOLOSTAR 100 UNIT/ML ~~LOC~~ SOPN: 60.0000 [IU] | PEN_INJECTOR | Freq: Every day | SUBCUTANEOUS | 5 refills | Status: AC

## 2023-11-17 NOTE — Telephone Encounter (Signed)
 Discontinued on 01/12/23, not covered by patient insurance.  Requested Prescriptions  Pending Prescriptions Disp Refills   LANTUS  SOLOSTAR 100 UNIT/ML Solostar Pen [Pharmacy Med Name: LANTUS  SOLOSTAR 100 UNIT/ML]  3    Sig: INJECT 45 UNITS INTO THE SKIN AT BEDTIME.     Endocrinology:  Diabetes - Insulins Failed - 11/17/2023  9:23 AM      Failed - HBA1C is between 0 and 7.9 and within 180 days    HB A1C (BAYER DCA - WAIVED)  Date Value Ref Range Status  10/16/2023 8.0 (H) 4.8 - 5.6 % Final    Comment:             Prediabetes: 5.7 - 6.4          Diabetes: >6.4          Glycemic control for adults with diabetes: <7.0          Passed - Valid encounter within last 6 months    Recent Outpatient Visits           1 month ago Type 2 diabetes mellitus with hyperglycemia, with long-term current use of insulin  (HCC)   [place] [facility] [place], [place] T, NP   3 months ago Type 2 diabetes mellitus with hyperglycemia, with long-term current use of insulin  (HCC)   [place] [facility] [place], [place] T, NP   4 months ago Type 2 diabetes mellitus with hyperglycemia, with long-term current use of insulin  (HCC)   [facility] [place], [place] T, NP   7 months ago Type 2 diabetes mellitus with hyperglycemia, with long-term current use of insulin  [facility]   [place] [facility] [place], Melanie DASEN, NP

## 2023-11-17 NOTE — Progress Notes (Signed)
   11/17/2023 Name: Carolyn Roy MRN: [id number] DOB: Nov 30, 1965  Patient is appearing on a report for [facility] [place] [facility] and last engaged with the clinical pharmacist to discuss diabetes on 09/07/2023. Contacted patient today to discuss diabetes management and completed medication review.   Diabetes Plan from last clinical pharmacist appointment:  Diabetes: -Currently uncontrolled -Continue current regimen at this time -Libre 3+ sensor will be changed 8/9 -If blood glucose continues to be elevated, specifically after eating, I recommend addition of meal time insulin - if patient is opposed, we could try sulfonylurea instead  Follow-up:  4 weeks Medication Adherence Barriers Identified:  Patient made recommended medication changes per plan: Yes Patient has seen PCP since last PharmD appointment Patient informs she is no longer taking Januvia . She informs she is now on Rybelsus  7mg  daily. She has been on this dose for almost a month. She informs she increased Lantus  to 60 units daily. She also informs she takes Metformin  1000mg  twice a day and Farxiga  10mg  daily. She informs she was off Rosuvastation for 2 months but has recently started it back. Inquired as to the reason the medication was stopped and she informed she was sick for a while and did not know if it was from the Crestor  or Rybelsus  but informs she determined that she was just sick and it wasn't the medications. Access issues with any new medication or testing device: Yes Patient is in need of Lantus  SOLOSTAR. She says the pharmacy filled vials but she can not use those. She requested the pharmacy contact MD for the pens and the pharmacy called and left her a message stating the provider denied the refill. Will send to PharmD to get a script sent in for Lantus  SOLOSTAR. Per Dr Annemarie fill history, Rybelsus  7mg  sold on 9/14 for 30 tablets, Rosuvastatin  sold on 9/3 for 90 tablets, Metformin  sold on 9/14 for 180 tablets, Farxiga   sold on 10/8 for 30 tablets and FreeStyle Sensors sold on 10/1 for quantity of 2. Patient is checking blood sugars as prescribed: Yes Patient uses FreeStyle CGM to check blood sugar. At the time of our call, patient's blood sugar is 285 which she sttributes to eating [employer] as that is what the grandchildren wanted to eat. She informs it can run low at around 330am with reported readins of 68, 69 She was able to inform the following time in range amounts;  3% when BS between 54-69  52% when BS between 70-180  34% when BS between 181-250  11% when BS 251 and higher. She informs average blood sugar is 169 and GMI is 7.4%  Medication Adherence Barriers Addressed/Actions Taken:  Reviewed medication changes per plan from last clinical pharmacist note Medication Access for Lantus  SOLOSTART Will discuss medication access concerns with pharmacist Educated patient to contact pharmacy regarding new prescriptions/refills.  Reviewed instructions for monitoring blood sugars at home and reminded patient to keep a written log to review with pharmacist Reminded patient of date/time of upcoming clinical pharmacist follow up and any upcoming PCP/specialists visits. Patient denies transportation barriers to the appointment. Yes  Next clinical pharmacist appointment is scheduled for: 12/22/2023  Tahirih Lair, CPhT [facility] Office: [phone] Email: [email or web address]

## 2023-11-17 NOTE — Progress Notes (Signed)
   11/17/2023  Patient ID: Carolyn Roy, female   DOB: Feb 22, 1965, 58 y.o.   MRN: [id number]  Patient has not picked up recently increased Lantus  prescription for 60 units daily dosing.  Prescription was initially sent in for vials, and she prefers pens.  Order pending for Lantus  Solostar pens 60 units daily to replace order from 9/8.  Patient has a follow-up with me 11/14, but it appear she may have missed a follow-up with PCP earlier this week.  I will advise that this needs to be rescheduled when we speak in November.    Carolyn Roy, PharmD, DPLA

## 2023-11-20 ENCOUNTER — Encounter: Payer: Self-pay | Admitting: Nurse Practitioner

## 2023-11-20 ENCOUNTER — Ambulatory Visit: Attending: Nurse Practitioner | Admitting: Nurse Practitioner

## 2023-11-20 DIAGNOSIS — G894 Chronic pain syndrome: Secondary | ICD-10-CM | POA: Diagnosis present

## 2023-11-20 DIAGNOSIS — M542 Cervicalgia: Secondary | ICD-10-CM | POA: Insufficient documentation

## 2023-11-20 DIAGNOSIS — M79641 Pain in right hand: Secondary | ICD-10-CM | POA: Insufficient documentation

## 2023-11-20 DIAGNOSIS — M25561 Pain in right knee: Secondary | ICD-10-CM | POA: Diagnosis present

## 2023-11-20 DIAGNOSIS — M79672 Pain in left foot: Secondary | ICD-10-CM | POA: Insufficient documentation

## 2023-11-20 DIAGNOSIS — M79671 Pain in right foot: Secondary | ICD-10-CM | POA: Diagnosis present

## 2023-11-20 DIAGNOSIS — M25562 Pain in left knee: Secondary | ICD-10-CM | POA: Diagnosis present

## 2023-11-20 DIAGNOSIS — M4316 Spondylolisthesis, lumbar region: Secondary | ICD-10-CM | POA: Diagnosis present

## 2023-11-20 DIAGNOSIS — G8929 Other chronic pain: Secondary | ICD-10-CM | POA: Insufficient documentation

## 2023-11-20 DIAGNOSIS — G893 Neoplasm related pain (acute) (chronic): Secondary | ICD-10-CM | POA: Insufficient documentation

## 2023-11-20 DIAGNOSIS — Z79891 Long term (current) use of opiate analgesic: Secondary | ICD-10-CM | POA: Insufficient documentation

## 2023-11-20 DIAGNOSIS — M25552 Pain in left hip: Secondary | ICD-10-CM | POA: Diagnosis present

## 2023-11-20 DIAGNOSIS — M79642 Pain in left hand: Secondary | ICD-10-CM | POA: Diagnosis present

## 2023-11-20 DIAGNOSIS — M25551 Pain in right hip: Secondary | ICD-10-CM | POA: Insufficient documentation

## 2023-11-20 MED ORDER — OXYCODONE HCL 5 MG PO TABS: 5.0000 mg | ORAL_TABLET | Freq: Two times a day (BID) | ORAL | 0 refills | Status: DC | PRN

## 2023-11-20 NOTE — Progress Notes (Signed)
 Nursing Pain Medication Assessment:  Safety precautions to be maintained throughout the outpatient stay will include: orient to surroundings, keep bed in low position, maintain call bell within reach at all times, provide assistance with transfer out of bed and ambulation.  Medication Inspection Compliance: Pill count conducted under aseptic conditions, in front of the patient. Neither the pills nor the bottle was removed from the patient's sight at any time. Once count was completed pills were immediately returned to the patient in their original bottle.  Medication: Oxycodone  IR Pill/Patch Count: 48 of 60 pills/patches remain Pill/Patch Appearance: Markings consistent with prescribed medication Bottle Appearance: Standard pharmacy container. Clearly labeled. Filled Date: 10 / 06 / 2025 Last Medication intake:  Yesterday

## 2023-11-20 NOTE — Progress Notes (Signed)
 PROVIDER NOTE: Interpretation of information contained herein should be left to medically-trained personnel. Specific patient instructions are provided elsewhere under Patient Instructions section of medical record. This document was created in part using AI and STT-dictation technology, any transcriptional errors that may result from this process are unintentional.  Patient: Carolyn Roy  Service: E/M   PCP: Valerio Melanie DASEN, NP  DOB: 01/19/1966  DOS: 11/20/2023  Provider: Emmy MARLA Blanch, NP  MRN: [id number]  Delivery: Face-to-face  Specialty: Interventional Pain Management  Type: Established Patient  Setting: Ambulatory outpatient facility  Specialty designation: 09  Referring Prov.: Valerio Melanie DASEN, NP  Location: Outpatient office facility       History of present illness (HPI) [name], a 58 y.o. year old female, is here today because of her foot pain and hand pain. Carolyn Roy primary complain today is Foot Pain and Hand Pain  Pertinent problems: Carolyn Roy has chronic fatigue; family history of breast cancer; chronic feet pain (1ry area of pain) (bilateral) (R>L); neuropathic pain of feet (bilateral); osteoarthritis of knee (right); chronic low back pain (bilateral) (L>R) w/o sciatica, and chronic pain syndrome on their pertinent problem list  Pain Assessment: Severity of Chronic pain is reported as a 4 /10. Location: Hand Left/Denies. Onset: More than a month ago. Quality: Dull, Nagging. Timing: Intermittent. Modifying factor(s): Rubbing the area. Vitals:  height is 5' 4 (1.626 m) and weight is 190 lb (86.2 kg). Her temporal temperature is 97.2 F (36.2 C) (abnormal). Her blood pressure is 99/76 and her pulse is 98. Her respiration is 18 and oxygen saturation is 98%.  BMI: Estimated body mass index is 32.61 kg/m as calculated from the following:   Height as of this encounter: 5' 4 (1.626 m).   Weight as of this encounter: 190 lb (86.2 kg).  Last encounter:  08/21/2023. Last procedure: Visit date not found.  Reason for encounter: medication management. The patient indicates doing well with current medication regimen. No adverse reaction or side effects reported to medication. She is experiencing chronic knee pain on her right knee, foot pain and bilateral hand pain. She is currently managing her pain with the medication and does not require any injection at this time.   Discussed the use of AI scribe software for clinical note transcription with the patient, who gave verbal consent to proceed.  History of Present Illness   Carolyn Roy is a 58 year old female with diabetes who presents with hand pain and swelling.  She experiences pain primarily in her hands, which she attributes to not having taken her medication yet due to oversleeping. She denies consuming more salt than usual.  In addition to hand pain, she experiences pain in her feet, which she associates with her diabetes. She describes a sensation similar to when her toes would dry out after being in a pool. This foot pain is a recurring issue for her.  She is currently taking oxycodone  5 mg twice daily for pain management and denies experiencing any side effects such as constipation or drowsiness. She also mentions issues with obtaining her insulin  due to pharmacy backlogs.  She mentions having 'normal knee' pain, which comes and goes. She stays active by walking daily and managing household activities, including caring for her eight grandchildren and five dogs.     Pharmacotherapy Assessment   Oxycodone  (Oxy IR/Roxiecodone) 5 mg immediate release tablet 2 times daily as needed for pain. MME=15 Monitoring: [place] PMP: PDMP reviewed during this encounter.  Pharmacotherapy: No side-effects or adverse reactions reported. Compliance: No problems identified. Effectiveness: Clinically acceptable.  Erlene Doyal SAUNDERS, [place]  11/20/2023 11:47 AM  Sign when Signing Visit Nursing Pain  Medication Assessment:  Safety precautions to be maintained throughout the outpatient stay will include: orient to surroundings, keep bed in low position, maintain call bell within reach at all times, provide assistance with transfer out of bed and ambulation.  Medication Inspection Compliance: Pill count conducted under aseptic conditions, in front of the patient. Neither the pills nor the bottle was removed from the patient's sight at any time. Once count was completed pills were immediately returned to the patient in their original bottle.  Medication: Oxycodone  IR Pill/Patch Count: 48 of 60 pills/patches remain Pill/Patch Appearance: Markings consistent with prescribed medication Bottle Appearance: Standard pharmacy container. Clearly labeled. Filled Date: 62 / 06 / 2025 Last Medication intake:  Yesterday    UDS:  Summary  Date Value Ref Range Status  08/21/2023 FINAL  Final    Comment:    ==================================================================== [employer] [facility] 13 (MW) ==================================================================== Test                             Result       Flag       Units  Drug Present and Declared for Prescription Verification   Oxycodone                       2002         EXPECTED   ng/mg creat   Oxymorphone                    158          EXPECTED   ng/mg creat   Noroxycodone                   2666         EXPECTED   ng/mg creat    Sources of oxycodone  include scheduled prescription medications.    Oxymorphone and noroxycodone are expected metabolites of oxycodone .    Oxymorphone is also available as a scheduled prescription medication.  ==================================================================== Test                      Result    Flag   Units      Ref Range   Creatinine              83               mg/dL      >=79 ==================================================================== Declared Medications:  The flagging and  interpretation on this report are based on the  following declared medications.  Unexpected results may arise from  inaccuracies in the declared medications.   **Note: The testing scope of this panel includes these medications:   Oxycodone  (Roxicodone )   **Note: The testing scope of this panel does not include the  following reported medications:   Acetaminophen  (Excedrin)  Aspirin  (Excedrin)  Bupropion  (Wellbutrin  XL)  Buspirone  (Buspar )  Caffeine (Excedrin)  Dapagliflozin  (Farxiga )  Duloxetine  (Cymbalta )  Famotidine  (Pepcid )  Gabapentin  (Neurontin )  Insulin  (Lantus )  Letrozole  (Femara )  Meloxicam  (Mobic )  Metformin  (Glucophage )  Naloxone  (Narcan )  Ondansetron  (Zofran )  Rosuvastatin  (Crestor )  Sitagliptin  (Januvia )  Sumatriptan  (Imitrex ) ==================================================================== For clinical consultation, please call [phone]. ====================================================================     No results found for: CBDTHCR No results found for: D8THCCBX No results  found for: D9THCCBX  ROS  Constitutional: Denies any fever or chills Gastrointestinal: No reported hemesis, hematochezia, vomiting, or acute GI distress Musculoskeletal: foot pain, hand pain  Neurological: No reported episodes of acute onset apraxia, aphasia, dysarthria, agnosia, amnesia, paralysis, loss of coordination, or loss of consciousness  Medication Review  DULoxetine , FreeStyle Libre 3 Plus Sensor, Insulin  Pen Needle, OneTouch Verio, SUMAtriptan , Semaglutide , aspirin -acetaminophen -caffeine, buPROPion , busPIRone , dapagliflozin  propanediol, famotidine , gabapentin , glucose blood, insulin  glargine, letrozole , meloxicam , metFORMIN , ondansetron , oxyCODONE , and rosuvastatin   History Review  Allergy: Ms. Jamie is allergic to aspirin . Drug: Ms. Whitcomb  reports no history of drug use. Alcohol:  reports no history of alcohol use. Tobacco:  reports that she has  quit smoking. Her smoking use included cigarettes. She started smoking about 11 years ago. She has a 11 pack-year smoking history. She has quit using smokeless tobacco.  Her smokeless tobacco use included snuff. Social: Ms. Petrasek  reports that she has quit smoking. Her smoking use included cigarettes. She started smoking about 11 years ago. She has a 11 pack-year smoking history. She has quit using smokeless tobacco.  Her smokeless tobacco use included snuff. She reports that she does not drink alcohol and does not use drugs. Medical:  has a past medical history of Allergy, Breast cancer (HCC), Cancer (HCC) (06/15/2017), Depression, Diabetes mellitus without complication (HCC) (2017), Edema of left upper extremity, Endometriosis, Family history of breast cancer, Headache, Hyperlipidemia, Lymphedema of left arm, Ovarian mass, Personal history of chemotherapy, Personal history of radiation therapy, and Pneumonia. Surgical: Ms. Herd  has a past surgical history that includes Oophorectomy; Tubal ligation; Portacath placement (Right, 07/14/2017); Axillary lymph node biopsy (Left, 07/14/2017); Breast lumpectomy (Left, 01/12/2018); Breast biopsy (Left); Partial mastectomy with needle localization (Left, 01/12/2018); Sentinel node biopsy (Left, 01/12/2018); Colonoscopy with propofol  (N/A, 12/27/2019); and Colonoscopy with propofol  (N/A, 01/05/2022). Family: family history includes Breast cancer in an other family member; Breast cancer (age of onset: 28) in her maternal grandmother; Breast cancer (age of onset: 35) in an other family member; Breast cancer (age of onset: 23) in her maternal aunt; Cancer in her mother; Colon cancer in her maternal grandmother and mother; Diabetes in her brother; Other in her father; Pancreatitis in her brother; Prostate cancer (age of onset: 36) in her brother.  Laboratory Chemistry Profile   Renal Lab Results  Component Value Date   BUN 11 10/16/2023   CREATININE 1.05 (H) 10/16/2023    BCR 10 10/16/2023   GFRAA >60 10/21/2019   GFRNONAA >60 08/29/2023    Hepatic Lab Results  Component Value Date   AST 16 10/16/2023   ALT 18 10/16/2023   ALBUMIN 4.0 10/16/2023   ALKPHOS 108 10/16/2023   LIPASE 31 12/01/2020    Electrolytes Lab Results  Component Value Date   NA 139 10/16/2023   K 4.3 10/16/2023   CL 102 10/16/2023   CALCIUM  9.4 10/16/2023   MG 2.3 03/07/2022    Bone Lab Results  Component Value Date   VD25OH 99.50 11/29/2022    Inflammation (CRP: Acute Phase) (ESR: Chronic Phase) Lab Results  Component Value Date   CRP <1 12/21/2020   ESRSEDRATE 3 12/21/2020         Note: Above Lab results reviewed.  Recent Imaging Review  DG Bone Density EXAM: DUAL X-RAY ABSORPTIOMETRY (DXA) FOR BONE MINERAL DENSITY 07/18/2023 10:05 am  CLINICAL DATA:  58 year old Female Postmenopausal. Screening for osteoporosis  History of fragility fracture. Patient is or has been on bone building therapies.  TECHNIQUE: An axial (e.g., hips, spine) and/or appendicular (e.g., radius) exam was performed, as appropriate, using GE Secretary/administrator at [facility]. Images are obtained for bone mineral density measurement and are not obtained for diagnostic purposes. [id number]  Exclusions: L3-L4 due to degenerative changes  COMPARISON:  None. New baseline.  FINDINGS: Scan quality: Good.  LUMBAR SPINE (L1-L2):  BMD (in g/cm2): 1.116  T-score: -0.5  Z-score: 0.4  LEFT FEMORAL NECK:  BMD (in g/cm2): 1.083  T-score: 0.3  Z-score: 1.4  LEFT TOTAL HIP:  BMD (in g/cm2): 1.122  T-score: 0.9  Z-score: 1.6  RIGHT FEMORAL NECK:  BMD (in g/cm2): 1.056  T-score: 0.1  Z-score: 1.2  RIGHT TOTAL HIP:  BMD (in g/cm2): 1.082  T-score: 0.6  Z-score: 1.3  FRAX 10-YEAR PROBABILITY OF FRACTURE:  FRAX not reported as the lowest BMD is not in the osteopenia range.  IMPRESSION: Normal based on BMD.  Fracture risk is unknown  due to history of bone building therapy.  RECOMMENDATIONS: 1. All patients should optimize calcium  and vitamin D  intake.  2. Consider FDA-approved medical therapies in postmenopausal women and men aged 56 years and older, based on the following:  - A hip or vertebral (clinical or morphometric) fracture  - T-score less than or equal to -2.5 and secondary causes have been excluded.  - Low bone mass (T-score between -1.0 and -2.5) and a 10-year probability of a hip fracture greater than or equal to 3% or a 10-year probability of a major osteoporosis-related fracture greater than or equal to 20% based on the [place] -adapted WHO algorithm.  - Clinician judgment and/or patient preferences may indicate treatment for people with 10-year fracture probabilities above or below these levels  3. Patients with diagnosis of osteoporosis or at high risk for fracture should have regular bone mineral density tests. For patients eligible for Medicare, routine testing is allowed once every 2 years. The testing frequency can be increased to one year for patients who have rapidly progressing disease, those who are receiving or discontinuing medical therapy to restore bone mass, or have additional risk factors.  Electronically Signed   By: Reyes Phi M.D.   On: 07/18/2023 10:51 Note: Reviewed        Physical Exam  Vitals: BP 99/76 (BP Location: Right Arm, Patient Position: Sitting, Cuff Size: Normal)   Pulse 98   Temp (!) 97.2 F (36.2 C) (Temporal)   Resp 18   Ht 5' 4 (1.626 m)   Wt 190 lb (86.2 kg)   LMP  (LMP Unknown) Comment: LAST PERIOD IN MAY 2019 WHEN SHE STARTED CHEMO  SpO2 98%   BMI 32.61 kg/m  BMI: Estimated body mass index is 32.61 kg/m as calculated from the following:   Height as of this encounter: 5' 4 (1.626 m).   Weight as of this encounter: 190 lb (86.2 kg). Ideal: Ideal body weight: 54.7 kg (120 lb 9.5 oz) Adjusted ideal body weight: 67.3 kg (148 lb 5.7 oz) General  appearance: Well nourished, well developed, and well hydrated. In no apparent acute distress Mental status: Alert, oriented x 3 (person, place, & time)       Respiratory: No evidence of acute respiratory distress Eyes: PERLA  Musculoskeletal: Foot pain, bilateral hand pain Assessment   Diagnosis Status  1. Chronic feet pain (1ry area of Pain) (Bilateral) (R>L)   2. Chronic knee pain (2ry area of Pain) (Bilateral) (R>L)   3. Chronic pain syndrome   4. Chronic use of  opiate for therapeutic purpose   5. Chronic hand pain (3ry area of Pain) (Bilateral) (R>L)   6. Cervicalgia   7. Cancer-related pain   8. Chronic hip pain (4th area of Pain) (Bilateral) (R>L)   9. 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  Plan of Care  Problem-specific:  Assessment and Plan    Chronic pain syndrome with pain in hands, feet, and knee Chronic pain in hands possibly due to delayed medication and morning stiffness. Hand swelling possibly linked to salt intake or diabetes. Foot pain may be exacerbated by diabetes. Intermittent knee pain. No adverse reactions to oxycodone . - Continue oxycodone  5 mg twice daily. - Send oxycodone  prescription to [facility] for three months. - Encouraged hand exercises to reduce swelling. - Advised monitoring and reducing salt intake if necessary. - Recommended walking at least 30 minutes daily for overall health.      Chronic pain syndrome: Patient's pain is well-controlled with oxycodone , will continue on current medication regimen.  Prescribing drug monitoring (PDMP) reviewed, findings consistent with the use of prescribed medication and no evidence of narcotic misuse or abuse.  Urine drug screening (UDS) up-to-date and consistent with the use of prescribed medication.  Schedule follow-up in 90 days for medication management.   Carolyn Roy has a current medication  list which includes the following long-term medication(s): bupropion , duloxetine , famotidine , gabapentin , lantus  solostar, metformin , rosuvastatin , sumatriptan , [START ON 12/13/2023] oxycodone , [START ON 01/12/2024] oxycodone , and [START ON 02/11/2024] oxycodone .  Pharmacotherapy (Medications Ordered): Meds ordered this encounter  Medications   oxyCODONE  (OXY IR/ROXICODONE ) 5 MG immediate release tablet    Sig: Take 1 tablet (5 mg total) by mouth 2 (two) times daily as needed for severe pain (pain score 7-10). Must last 30 days    Dispense:  60 tablet    Refill:  0    DO NOT: delete (not duplicate); no partial-fill (will deny script to complete), no refill request (F/U required). DISPENSE: 1 day early if closed on fill date. WARN: No CNS-depressants within 8 hrs of med.   oxyCODONE  (OXY IR/ROXICODONE ) 5 MG immediate release tablet    Sig: Take 1 tablet (5 mg total) by mouth 2 (two) times daily as needed for severe pain (pain score 7-10). Must last 30 days    Dispense:  60 tablet    Refill:  0    DO NOT: delete (not duplicate); no partial-fill (will deny script to complete), no refill request (F/U required). DISPENSE: 1 day early if closed on fill date. WARN: No CNS-depressants within 8 hrs of med.   oxyCODONE  (OXY IR/ROXICODONE ) 5 MG immediate release tablet    Sig: Take 1 tablet (5 mg total) by mouth 2 (two) times daily as needed for severe pain (pain score 7-10). Must last 30 days    Dispense:  60 tablet    Refill:  0    DO NOT: delete (not duplicate); no partial-fill (will deny script to complete), no refill request (F/U required). DISPENSE: 1 day early if closed on fill date. WARN: No CNS-depressants within 8 hrs of med.   Orders:  No orders of the defined types were placed in this encounter.       Return in about 3 months (around 02/20/2024) for (F2F), (MM), Emmy Blanch NP.    Recent Visits No visits were found meeting these conditions. Showing recent visits within past 90 days and  meeting  all other requirements Today's Visits Date Type Provider Dept  11/20/23 Office Visit Emillio Ngo K, NP [facility]  Showing today's visits and meeting all other requirements Future Appointments Date Type Provider Dept  02/13/24 Appointment Kinya Meine K, NP [facility]  Showing future appointments within next 90 days and meeting all other requirements  I discussed the assessment and treatment plan with the patient. The patient was provided an opportunity to ask questions and all were answered. The patient agreed with the plan and demonstrated an understanding of the instructions.  Patient advised to call back or seek an in-person evaluation if the symptoms or condition worsens.  I personally spent a total of 30 minutes in the care of the patient today including preparing to see the patient, getting/reviewing separately obtained history, performing a medically appropriate exam/evaluation, counseling and educating, placing orders, referring and communicating with other health care professionals, documenting clinical information in the EHR, independently interpreting results, communicating results, and coordinating care.   Note by: Sentoria Brent K Avyay Coger, NP (TTS and AI technology used. I apologize for any typographical errors that were not detected and corrected.) Date: 11/20/2023; Time: 12:53 PM

## 2023-11-29 ENCOUNTER — Other Ambulatory Visit: Payer: Self-pay | Admitting: Internal Medicine

## 2023-11-29 ENCOUNTER — Inpatient Hospital Stay: Attending: Internal Medicine

## 2023-12-02 ENCOUNTER — Other Ambulatory Visit: Payer: Self-pay | Admitting: Nurse Practitioner

## 2023-12-04 NOTE — Telephone Encounter (Signed)
 Requested Prescriptions  Pending Prescriptions Disp Refills   SUMAtriptan  (IMITREX ) 100 MG tablet [Pharmacy Med Name: SUMATRIPTAN  SUCC 100 MG TABLET] 9 tablet 0    Sig: TAKE 1 TAB BY MOUTH EVERY 2 HOURS AS NEEDED FOR MIGRAINE. MAY REPEAT IN 2 HOURS IF HEADACHE PERSISTS OR RECURS.     Neurology:  Migraine Therapy - Triptan Passed - 12/04/2023  3:52 PM      Passed - Last BP in normal range    BP Readings from Last 1 Encounters:  11/20/23 99/76         Passed - Valid encounter within last 12 months    Recent Outpatient Visits           1 month ago Type 2 diabetes mellitus with hyperglycemia, with long-term current use of insulin  (HCC)   [place] [facility] [place], [place] T, NP   3 months ago Type 2 diabetes mellitus with hyperglycemia, with long-term current use of insulin  (HCC)   [place] [facility] [place], [employer] T, NP   5 months ago Type 2 diabetes mellitus with hyperglycemia, with long-term current use of insulin  (HCC)   [place] [facility] [place], [place] T, NP   8 months ago Type 2 diabetes mellitus with hyperglycemia, with long-term current use of insulin  [facility])   [place] [name] [place], Melanie DASEN, NP

## 2023-12-07 ENCOUNTER — Telehealth: Payer: Self-pay | Admitting: Nurse Practitioner

## 2023-12-07 ENCOUNTER — Encounter: Payer: Self-pay | Admitting: Nurse Practitioner

## 2023-12-07 NOTE — Telephone Encounter (Signed)
 Called patient to discuss, she does not want to come in sooner than December 9th. But did want to notify Jolene that the depression has gotten worse.

## 2023-12-20 NOTE — Progress Notes (Unsigned)
   12/22/2023  Patient ID: Carolyn Roy, female   DOB: 17-Dec-1965, 58 y.o.   MRN: [id number]  Outreach attempt for scheduled telephone follow-up visit was not successful, but I was able to leave HIPAA compliant voicemail with my direct phone number.  I am also sending a MyChart message to see if we can reschedule the visit  I will try to call the patient again in 1-2 weeks if I do not hear back.  Carolyn Roy, PharmD, DPLA

## 2023-12-22 ENCOUNTER — Other Ambulatory Visit: Payer: Self-pay

## 2023-12-29 ENCOUNTER — Other Ambulatory Visit: Payer: Self-pay

## 2023-12-29 NOTE — Progress Notes (Unsigned)
   12/29/2023  Patient ID: Carolyn Roy, female   DOB: 1965-11-03, 58 y.o.   MRN: [id number]  Outreach attempt for scheduled telephone follow-up visit was not successful, but I was able to leave a HIPAA compliant voicemail with my direct phone number.  I am also sending a MyChart message to attempt to reschedule visit and will try to call the patient in another 1-2 weeks if I do not hear back.  [name], PharmD, DPLA
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
## 2024-01-01 NOTE — Progress Notes (Unsigned)
   01/01/2024  Patient ID: Carolyn Roy, female   DOB: [birth year], 58 y.o.   MRN: [id number]  Subjective/Objective I have not been able to successfully follow-up with patient since our last telephone visit 7/31; patient sees PCP again 9/8, so sending assessment and recommendations in regard to diabetes control  Diabetes -Current medications:  metformin  1000mg  BID, Januvia  100mg  daily, Farxiga  10mg  daily, Lantus  60units daily  -Patient is using Libre 3+ for CGM and data is linked to [employer].  Information from the past 2 weeks: Time CGM active 47% Average Glucose 250 GMI 9.3% Glucose Variability 33.7% Time in Ranges: Very High 48%, High 29%, Target 23%, no low/very low Patterns observed:  BG consistently high from approximately 10am-3am -Recent fill history: Farxiga  10mg  30 day supply picked up 8/30, 7/27, 6/30, 5/29 Januvia  100mg  30 day supply picked up 8/30, 7/27, 6/19. 4/28 Lantus  90 day supply picked up 6/8, 3/7- day supply was calculated based on patient using 45 units daily (now using 60 units) Libre 3+ Sensors 30 day supply pick up 9/1, 7/27, 6/21 Metformin  1000mg  80 day supply picked up 4/14 -Patient did not tolerate GLP1 medications, but it appears only Trulicity  and Ozempic  have been tried previously -Last A1c was 10.3% 5/28  Assessment/Plan -Uncontrolled with A1c >7% -CGM data reflects consistent hyperglycemia; this has increased significantly from June/July averages -Fill history reflects good adherence to Farxiga  and Januvia  but not metformin  and Lantus  -Sees PCP again Monday and will be due for A1c -Identify any barriers to adherence in regard to Lantus  and metformin  -I recommend stopping Januvia  and revisiting GLP1 such as Mounjaro or Rybelsus  (I do not see these have been used in the past).  Test claims reflect $14.99 copay for Rybelsus  or $25 for [facility]  Follow-up:  I will attempt to check in with patient 4 weeks after upcoming PCP  visit  Carolyn Roy, PharmD, DPLA

## 2024-01-02 ENCOUNTER — Other Ambulatory Visit: Payer: Self-pay

## 2024-01-02 DIAGNOSIS — E1169 Type 2 diabetes mellitus with other specified complication: Secondary | ICD-10-CM

## 2024-01-02 DIAGNOSIS — E1165 Type 2 diabetes mellitus with hyperglycemia: Secondary | ICD-10-CM

## 2024-01-02 NOTE — Progress Notes (Signed)
   01/02/2024  Patient ID: Carolyn Roy, female   DOB: [birth year], 58 y.o.   MRN: [id number]  Subjective/Objective Telephone follow-up visit to address control of diabetes  Diabetes -Current medications:  metformin  1000mg  BID, Rybelsus  7mg  daily, Farxiga  10mg  daily, Lantus  60units daily  -Patient is using Libre 3+ for CGM and data is linked to [facility] LibreView Dashboard.  Information from the past 2 weeks: Time CGM active 99% Average Glucose 197 GMI 8% Glucose Variability 38.5% Time in Ranges: Very High 23%, High 31%, Target 45%, Low 1% Patterns observed:  BG consistently high in the afternoon and evening -Patient endorses good adherence to medication regimen -Patient did not tolerate Ozempic  and Trulicity  in the past but endorses tolerating Rybelsus  well so far -ACEi/ARB for cardiorenal protection:  not indicated due to hypotension -UACR 30-300 06/01/23 -Statin for ASCVD risk reduction:  rosuvastatin  40mg  daily  A1c Lab Results  Component Value Date   HGBA1C 8.0 (H) 10/16/2023   HGBA1C 10.3 (H) 07/05/2023   HGBA1C 7.6 (H) 04/03/2023   CMP     Component Value Date/Time   NA 139 10/16/2023 1040   K 4.3 10/16/2023 1040   CL 102 10/16/2023 1040   CO2 24 10/16/2023 1040   GLUCOSE 237 (H) 10/16/2023 1040   GLUCOSE 156 (H) 08/29/2023 1427   BUN 11 10/16/2023 1040   CREATININE 1.05 (H) 10/16/2023 1040   CREATININE 0.96 08/29/2023 1427   CALCIUM  9.4 10/16/2023 1040   PROT 6.3 10/16/2023 1040   ALBUMIN 4.0 10/16/2023 1040   AST 16 10/16/2023 1040   AST 19 08/29/2023 1427   ALT 18 10/16/2023 1040   ALT 20 08/29/2023 1427   ALKPHOS 108 10/16/2023 1040   BILITOT 0.3 10/16/2023 1040   BILITOT 0.7 08/29/2023 1427   EGFR 62 10/16/2023 1040   GFRNONAA >60 08/29/2023 1427  Lipid Panel     Component Value Date/Time   CHOL 227 (H) 10/16/2023 1040   CHOL WILL FOLLOW 11/15/2016 1452   TRIG 187 (H) 10/16/2023 1040   TRIG WILL FOLLOW 11/15/2016 1452   HDL 51 10/16/2023 1040    VLDL WILL FOLLOW 11/15/2016 1452   LDLCALC 142 (H) 10/16/2023 1040   LABVLDL 34 10/16/2023 1040   Assessment/Plan  Diabetes -Uncontrolled with A1c >7% -BP at goal -LDL not at goal of <70 -UACR not at goal -Advised patient to change Lantus  to morning use versus bedtime to see if this helps with hyperglycemia throughout the day (and help prevent rare hypoglycemia overnight) -Continue current regimen until upcoming follow-up with PCP on 12/9 -I recommend follow-up A1c, UACR, lipid panel and CMP at next PCP visit -Consider increasing Rybelsus  to 14mg  daily if patient continues to tolerate well -If LDL remains >70, could consider addition of ezetimibe 10mg  daily or Repatha 140mg  every 14 days -If BG remains elevated in the afternoon and evening, may need to consider meal-time insulin   Follow-up:  1/12  Carolyn Roy, PharmD, DPLA

## 2024-01-03 ENCOUNTER — Encounter: Payer: Self-pay | Admitting: Psychiatry

## 2024-01-03 ENCOUNTER — Ambulatory Visit (INDEPENDENT_AMBULATORY_CARE_PROVIDER_SITE_OTHER): Payer: Self-pay | Admitting: Psychiatry

## 2024-01-03 DIAGNOSIS — F330 Major depressive disorder, recurrent, mild: Secondary | ICD-10-CM | POA: Diagnosis not present

## 2024-01-03 DIAGNOSIS — F4310 Post-traumatic stress disorder, unspecified: Secondary | ICD-10-CM

## 2024-01-03 DIAGNOSIS — G4700 Insomnia, unspecified: Secondary | ICD-10-CM

## 2024-01-05 ENCOUNTER — Other Ambulatory Visit: Payer: Self-pay | Admitting: Nurse Practitioner

## 2024-01-06 ENCOUNTER — Other Ambulatory Visit: Payer: Self-pay | Admitting: Nurse Practitioner

## 2024-01-06 NOTE — Patient Instructions (Addendum)
 Starting meal time insulin : Start by injecting 5 to 6 units into the skin before dinner. Do not inject if you do not eat. Check sugars two hours after eating and if >180 consistently then increase supper insulin  by 1 to 2 units. Stop Rybelsus .  Be Involved in Caring For Your Health:  Taking Medications When medications are taken as directed, they can greatly improve your health. But if they are not taken as prescribed, they may not work. In some cases, not taking them correctly can be harmful. To help ensure your treatment remains effective and safe, understand your medications and how to take them. Bring your medications to each visit for review by your provider.  Your lab results, notes, and after visit summary will be available on My Chart. We strongly encourage you to use this feature. If lab results are abnormal the clinic will contact you with the appropriate steps. If the clinic does not contact you assume the results are satisfactory. You can always view your results on My Chart. If you have questions regarding your health or results, please contact the clinic during office hours. You can also ask questions on My Chart.  We at [facility] are grateful that you chose us  to provide your care. We strive to provide evidence-based and compassionate care and are always looking for feedback. If you get a survey from the clinic please complete this so we can hear your opinions.   Diabetes Mellitus and Exercise Regular exercise is important for your health, especially if you have diabetes mellitus. Exercise is not just about losing weight. It can also help you increase muscle strength and bone density and reduce body fat and stress. This can help your level of endurance and make you more fit and flexible. Why should I exercise if I have diabetes? Exercise has many benefits for people with diabetes. It can: Help lower and control your blood sugar (glucose). Help your body respond better  and become more sensitive to the hormone insulin . Reduce how much insulin  your body needs. Lower your risk for heart disease by: Lowering how much bad cholesterol and triglycerides you have in your body. Increasing how much good cholesterol you have in your body. Lowering your blood pressure. Lowering your blood glucose levels. What is my activity plan? Your health care provider or an expert trained in diabetes care (certified diabetes educator) can help you make an activity plan. This plan can help you find the type of exercise that works for you. It may also tell you how often to exercise and for how long. Be sure to: Get at least 150 minutes of medium-intensity or high-intensity exercise each week. This may involve brisk walking, biking, or water aerobics. Do stretching and strengthening exercises at least 2 times a week. This may involve yoga or weight lifting. Spread out your activity over at least 3 days of the week. Get some form of physical activity each day. Do not go more than 2 days in a row without some kind of activity. Avoid being inactive for more than 30 minutes at a time. Take frequent breaks to walk or stretch. Choose activities that you enjoy. Set goals that you know you can accomplish. Start slowly and increase the intensity of your exercise over time. How do I manage my diabetes during exercise?  Monitor your blood glucose Check your blood glucose before and after you exercise. If your blood glucose is 240 mg/dL (86.6 mmol/L) or higher before you exercise, check your urine  for ketones. These are chemicals created by the liver. If you have ketones in your urine, do not exercise until your blood glucose returns to normal. If your blood glucose is 100 mg/dL (5.6 mmol/L) or lower, eat a snack that has 15-20 grams of carbohydrate in it. Check your blood glucose 15 minutes after the snack to make sure that your level is above 100 mg/dL (5.6 mmol/L) before you start to  exercise. Your risk for low blood glucose (hypoglycemia) goes up during and after exercise. Know the symptoms of this condition and how to treat it. Follow these instructions at home: Keep a carbohydrate snack on hand for use before, during, and after exercise. This can help prevent or treat hypoglycemia. Avoid injecting insulin  into parts of your body that are going to be used during exercise. This may include: Your arms, when you are going to play tennis. Your legs, when you are about to go jogging. Keep track of your exercise habits. This can help you and your health care provider watch and adjust your activity plan. Write down: What you eat before and after you exercise. Blood glucose levels before and after you exercise. The type and amount of exercise you do. Talk to your health care provider before you start a new activity. They may need to: Make sure that the activity is safe for you. Adjust your insulin , other medicines, and food that you eat. Drink water while you exercise. This can stop you from losing too much water (dehydration). It can also prevent problems caused by having a lot of heat in your body (heat stroke). Where to find more information American Diabetes Association: diabetes.org Association of Diabetes Care & Education Specialists: diabeteseducator.org This information is not intended to replace advice given to you by your health care provider. Make sure you discuss any questions you have with your health care provider. Document Revised: 07/14/2021 Document Reviewed: 07/14/2021 Elsevier Patient Education  2024 [employer].

## 2024-01-09 NOTE — Telephone Encounter (Signed)
 Requested Prescriptions  Pending Prescriptions Disp Refills   busPIRone  (BUSPAR ) 5 MG tablet [Pharmacy Med Name: BUSPIRONE  HCL 5 MG TABLET] 180 tablet 1    Sig: TAKE 1 TABLET BY MOUTH TWICE A DAY     Psychiatry: Anxiolytics/Hypnotics - Non-controlled Passed - 01/09/2024 12:44 PM      Passed - Valid encounter within last 12 months    Recent Outpatient Visits           2 months ago Type 2 diabetes mellitus with hyperglycemia, with long-term current use of insulin  (HCC)   [place] [facility] [place], [place] T, NP   4 months ago Type 2 diabetes mellitus with hyperglycemia, with long-term current use of insulin  (HCC)   [place] [facility] [place], [place] T, NP   6 months ago Type 2 diabetes mellitus with hyperglycemia, with long-term current use of insulin  (HCC)   [facility] [place], Jolene T, NP   9 months ago Type 2 diabetes mellitus with hyperglycemia, with long-term current use of insulin  [facility])   [place] [facility] [place], Melanie DASEN, NP

## 2024-01-10 NOTE — Telephone Encounter (Signed)
 Requested Prescriptions  Pending Prescriptions Disp Refills   DULoxetine  (CYMBALTA ) 60 MG capsule [Pharmacy Med Name: DULOXETINE  HCL DR 60 MG CAP] 90 capsule 0    Sig: TAKE 1 CAPSULE BY MOUTH EVERY DAY     Psychiatry: Antidepressants - SNRI - duloxetine  Failed - 01/10/2024 10:38 AM      Failed - Cr in normal range and within 360 days    Creatinine  Date Value Ref Range Status  08/29/2023 0.96 0.44 - 1.00 mg/dL Final   Creatinine, Ser  Date Value Ref Range Status  10/16/2023 1.05 (H) 0.57 - 1.00 mg/dL Final         Passed - eGFR is 30 or above and within 360 days    GFR calc Af Amer  Date Value Ref Range Status  10/21/2019 >60 >60 mL/min Final   GFR, Estimated  Date Value Ref Range Status  08/29/2023 >60 >60 mL/min Final    Comment:    (NOTE) Calculated using the CKD-EPI Creatinine Equation (2021)    eGFR  Date Value Ref Range Status  10/16/2023 62 >59 mL/min/1.73 Final         Passed - Completed PHQ-2 or PHQ-9 in the last 360 days      Passed - Last BP in normal range    BP Readings from Last 1 Encounters:  11/20/23 99/76         Passed - Valid encounter within last 6 months    Recent Outpatient Visits           2 months ago Type 2 diabetes mellitus with hyperglycemia, with long-term current use of insulin  (HCC)   [place] [facility] [place], [place] T, NP   4 months ago Type 2 diabetes mellitus with hyperglycemia, with long-term current use of insulin  (HCC)   [place] [facility] [place], [place] T, NP   6 months ago Type 2 diabetes mellitus with hyperglycemia, with long-term current use of insulin  (HCC)   [place] [facility] [place], Jolene T, NP   9 months ago Type 2 diabetes mellitus with hyperglycemia, with long-term current use of insulin  (HCC)   [place] [facility] [place], Jolene T, NP               dapagliflozin  propanediol (FARXIGA ) 10 MG TABS tablet [Pharmacy Med Name: FARXIGA  10 MG  TABLET] 90 tablet 0    Sig: TAKE 1 TABLET BY MOUTH DAILY BEFORE BREAKFAST.     Endocrinology:  Diabetes - SGLT2 Inhibitors Failed - 01/10/2024 10:38 AM      Failed - Cr in normal range and within 360 days    Creatinine  Date Value Ref Range Status  08/29/2023 0.96 0.44 - 1.00 mg/dL Final   Creatinine, Ser  Date Value Ref Range Status  10/16/2023 1.05 (H) 0.57 - 1.00 mg/dL Final         Failed - HBA1C is between 0 and 7.9 and within 180 days    HB A1C (BAYER DCA - WAIVED)  Date Value Ref Range Status  10/16/2023 8.0 (H) 4.8 - 5.6 % Final    Comment:             Prediabetes: 5.7 - 6.4          Diabetes: >6.4          Glycemic control for adults with diabetes: <7.0          Passed - eGFR in normal range and within 360 days  GFR calc Af Amer  Date Value Ref Range Status  10/21/2019 >60 >60 mL/min Final   GFR, Estimated  Date Value Ref Range Status  08/29/2023 >60 >60 mL/min Final    Comment:    (NOTE) Calculated using the CKD-EPI Creatinine Equation (2021)    eGFR  Date Value Ref Range Status  10/16/2023 62 >59 mL/min/1.73 Final         Passed - Valid encounter within last 6 months    Recent Outpatient Visits           2 months ago Type 2 diabetes mellitus with hyperglycemia, with long-term current use of insulin  (HCC)   [place] [facility] [place], [place] T, NP   4 months ago Type 2 diabetes mellitus with hyperglycemia, with long-term current use of insulin  (HCC)   [place] [facility] [place], [place] T, NP   6 months ago Type 2 diabetes mellitus with hyperglycemia, with long-term current use of insulin  (HCC)   [place] [facility] [place], [place] T, NP   9 months ago Type 2 diabetes mellitus with hyperglycemia, with long-term current use of insulin  (HCC)   [place] [facility] [place], Jolene T, NP               SUMAtriptan  (IMITREX ) 100 MG tablet [Pharmacy Med Name: SUMATRIPTAN  SUCC 100 MG  TABLET] 9 tablet 0    Sig: TAKE 1 TAB BY MOUTH EVERY 2 HOURS AS NEEDED FOR MIGRAINE. MAY REPEAT IN 2 HOURS IF HEADACHE PERSISTS OR RECURS.     Neurology:  Migraine Therapy - Triptan Passed - 01/10/2024 10:38 AM      Passed - Last BP in normal range    BP Readings from Last 1 Encounters:  11/20/23 99/76         Passed - Valid encounter within last 12 months    Recent Outpatient Visits           2 months ago Type 2 diabetes mellitus with hyperglycemia, with long-term current use of insulin  (HCC)   [place] [facility] [place], [place] T, NP   4 months ago Type 2 diabetes mellitus with hyperglycemia, with long-term current use of insulin  (HCC)   [place] [facility] [place], [place] T, NP   6 months ago Type 2 diabetes mellitus with hyperglycemia, with long-term current use of insulin  (HCC)   [place] [facility] [place], [place] T, NP   9 months ago Type 2 diabetes mellitus with hyperglycemia, with long-term current use of insulin  [facility])   [place] [facility] [place], Melanie DASEN, NP

## 2024-01-11 ENCOUNTER — Emergency Department: Admission: EM | Admit: 2024-01-11 | Discharge: 2024-01-11 | Disposition: A | Discharge status: Home / Self Care

## 2024-01-11 ENCOUNTER — Encounter: Payer: Self-pay | Admitting: Internal Medicine

## 2024-01-11 ENCOUNTER — Other Ambulatory Visit: Payer: Self-pay

## 2024-01-11 ENCOUNTER — Emergency Department

## 2024-01-11 ENCOUNTER — Ambulatory Visit
Admission: EM | Admit: 2024-01-11 | Discharge: 2024-01-11 | Disposition: A | Discharge status: Home / Self Care | Attending: Emergency Medicine | Admitting: Emergency Medicine

## 2024-01-11 ENCOUNTER — Encounter: Payer: Self-pay | Admitting: Emergency Medicine

## 2024-01-11 DIAGNOSIS — R112 Nausea with vomiting, unspecified: Secondary | ICD-10-CM | POA: Insufficient documentation

## 2024-01-11 DIAGNOSIS — E119 Type 2 diabetes mellitus without complications: Secondary | ICD-10-CM | POA: Diagnosis not present

## 2024-01-11 DIAGNOSIS — E1165 Type 2 diabetes mellitus with hyperglycemia: Secondary | ICD-10-CM

## 2024-01-11 DIAGNOSIS — Z794 Long term (current) use of insulin: Secondary | ICD-10-CM | POA: Diagnosis not present

## 2024-01-11 DIAGNOSIS — Z853 Personal history of malignant neoplasm of breast: Secondary | ICD-10-CM | POA: Insufficient documentation

## 2024-01-11 DIAGNOSIS — R140 Abdominal distension (gaseous): Secondary | ICD-10-CM | POA: Diagnosis present

## 2024-01-11 DIAGNOSIS — K5900 Constipation, unspecified: Secondary | ICD-10-CM | POA: Diagnosis not present

## 2024-01-11 DIAGNOSIS — R7989 Other specified abnormal findings of blood chemistry: Secondary | ICD-10-CM | POA: Insufficient documentation

## 2024-01-11 LAB — URINALYSIS, ROUTINE W REFLEX MICROSCOPIC
Bacteria, UA: NONE SEEN
Bilirubin Urine: NEGATIVE
Glucose, UA: >=500 mg/dL — AB
Ketones, ur: NEGATIVE mg/dL
Leukocytes,Ua: NEGATIVE
Nitrite: NEGATIVE
Protein, ur: 100 mg/dL — AB
Specific Gravity, Urine: 1.032 — ABNORMAL HIGH (ref 1.005–1.030)
pH: 5 (ref 5.0–8.0)

## 2024-01-11 LAB — COMPREHENSIVE METABOLIC PANEL WITH GFR
ALT: 22 U/L (ref 0–44)
AST: 22 U/L (ref 15–41)
Albumin: 4.6 g/dL (ref 3.5–5.0)
Alkaline Phosphatase: 101 U/L (ref 38–126)
Anion gap: 13 (ref 5–15)
BUN: 15 mg/dL (ref 6–20)
CO2: 30 mmol/L (ref 22–32)
Calcium: 10.6 mg/dL — ABNORMAL HIGH (ref 8.9–10.3)
Chloride: 103 mmol/L (ref 98–111)
Creatinine, Ser: 1.21 mg/dL — ABNORMAL HIGH (ref 0.44–1.00)
GFR, Estimated: 52 mL/min — ABNORMAL LOW (ref >60)
Glucose, Bld: 189 mg/dL — ABNORMAL HIGH (ref 70–99)
Potassium: 4.1 mmol/L (ref 3.5–5.1)
Sodium: 146 mmol/L — ABNORMAL HIGH (ref 135–145)
Total Bilirubin: 0.5 mg/dL (ref 0.0–1.2)
Total Protein: 7.7 g/dL (ref 6.5–8.1)

## 2024-01-11 LAB — CBC
HCT: 43.8 % (ref 36.0–46.0)
Hemoglobin: 14.3 g/dL (ref 12.0–15.0)
MCH: 30.8 pg (ref 26.0–34.0)
MCHC: 32.6 g/dL (ref 30.0–36.0)
MCV: 94.2 fL (ref 80.0–100.0)
Platelets: 257 K/uL (ref 150–400)
RBC: 4.65 MIL/uL (ref 3.87–5.11)
RDW: 14.8 % (ref 11.5–15.5)
WBC: 10.3 K/uL (ref 4.0–10.5)
nRBC: 0 % (ref 0.0–0.2)

## 2024-01-11 LAB — LIPASE, BLOOD: Lipase: 21 U/L (ref 11–51)

## 2024-01-11 LAB — GLUCOSE, POCT (MANUAL RESULT ENTRY): POCT Glucose (KUC): 182 mg/dL — AB (ref 70–99)

## 2024-01-11 MED ORDER — DULCOLAX 5 MG PO TBEC: 5.0000 mg | DELAYED_RELEASE_TABLET | Freq: Every day | ORAL | 1 refills | Status: AC | PRN

## 2024-01-11 MED ORDER — POLYETHYLENE GLYCOL 3350 17 G PO PACK: 17.0000 g | PACK | Freq: Every day | ORAL | 0 refills | Status: AC

## 2024-01-11 MED ORDER — FLEET ENEMA RE ENEM: 1.0000 | ENEMA | Freq: Every day | RECTAL | 0 refills | Status: AC | PRN

## 2024-01-11 MED ORDER — SODIUM CHLORIDE 0.9 % IV BOLUS
1000.0000 mL | Freq: Once | INTRAVENOUS | Status: AC
  Administered 2024-01-11: 1000 mL via INTRAVENOUS

## 2024-01-11 MED ORDER — FAMOTIDINE IN NACL 20-0.9 MG/50ML-% IV SOLN
20.0000 mg | Freq: Once | INTRAVENOUS | Status: AC
  Administered 2024-01-11: 20 mg via INTRAVENOUS
  Filled 2024-01-11: qty 50

## 2024-01-11 MED ORDER — IOHEXOL 300 MG/ML  SOLN
100.0000 mL | Freq: Once | INTRAMUSCULAR | Status: AC | PRN
  Administered 2024-01-11: 100 mL via INTRAVENOUS

## 2024-01-11 NOTE — Discharge Instructions (Addendum)
 Please go to the emergency department at [facility] to be evaluated for possible bowel obstruction.  Please go and help.

## 2024-01-11 NOTE — ED Notes (Signed)
 Patient is being discharged from the Urgent Care and sent to the Emergency Department via Personal Vehicle . Per Harriette, NP, patient is in need of higher level of care due to Possible Bowel Obstruction. Patient is aware and verbalizes understanding of plan of care.  Vitals:   01/11/24 1612  BP: 115/80  Pulse: 91  Temp: 98.4 F (36.9 C)  SpO2: 93%

## 2024-01-11 NOTE — Discharge Instructions (Signed)

## 2024-01-11 NOTE — ED Triage Notes (Signed)
 Pt to ED for emesis x1 week, reports concerned about blockage. Constipation x1 month.

## 2024-01-11 NOTE — ED Triage Notes (Signed)
 Pt is with her husband  Pt c/o elevated blood sugar x1week  Pt has been taking her diabetes medication, but has been vomiting after eating or swallowing her pills.  Pt is unsure what her blood sugar is currently  Pt does not have any continuous blood sugar sensors left at home and is unable to check her sugar levels.  Pt has a burning sensation inside her stomach and is unsure if it is related.

## 2024-01-11 NOTE — ED Provider Notes (Signed)
 [facility]    CSN: [id number] Arrival date & time: 01/11/24  1551      History   Chief Complaint Chief Complaint  Patient presents with   Hyperglycemia    HPI NAFISA OLDS is a 58 y.o. female.   HPI  58 year old female past medical history significant for breast cancer currently undergoing chemo and type 2 diabetes that is requiring insulin  presents for evaluation of nausea and vomiting for the last 7 days.  She also reports a burning sensation in her abdomen and distention of her abdomen.  Patient had a small bowel movement 2 days ago but last normal bowel movement was 1.5 to 2 weeks ago.  No fever.  Unable to check her blood sugar as she is out of CGM monitors.  Past Medical History:  Diagnosis Date   Allergy    Breast cancer (HCC)    Cancer (HCC) 06/15/2017   5.1 cm, T3,N1 (clinical): ER/ PR positive, Her 2 neu not overexpressed, High Ki 67. Neuoadjuvant chemotherapy.    Depression    Diabetes mellitus without complication (HCC) 2017   Edema of left upper extremity    Endometriosis    Family history of breast cancer    Headache    migraines   Hyperlipidemia    Lymphedema of left arm    Ovarian mass    Personal history of chemotherapy    Personal history of radiation therapy    Pneumonia    2018    Patient Active Problem List   Diagnosis Date Noted   History of colonic polyps 01/05/2022   Obesity 12/03/2021   Hypotension 07/28/2021   Grade 1 Anterolisthesis of lumbar spine (L3/L4 and L4/L5) 03/03/2021   Arthropathy of spinal facet joint concurrent with and due to effusion (L3-4, L4-5) 03/03/2021   Migraine headache without aura 01/15/2021   GERD (gastroesophageal reflux disease) 12/29/2020   Effusion of knee joint (Right) 08/17/2020   Tricompartment osteoarthritis of knee (Right) 08/17/2020   Chronic use of opiate for therapeutic purpose 04/29/2020   Family history of colon cancer in mother    Polyp of colon    Uncomplicated opioid  dependence (HCC) 09/18/2019   Numbness and tingling of upper extremity (C6/C7 dermatomes) (Right) 04/30/2019   Personal history of breast cancer 04/30/2019   Cervicalgia 04/30/2019   Cervical radiculitis (Right) 04/30/2019   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) 12/21/2018   Spondylosis without myelopathy or radiculopathy, lumbosacral region 08/16/2018   DDD (degenerative disc disease), lumbosacral 08/16/2018   Abnormal MRI, lumbar spine (04/20/2018) 05/09/2018   Lumbar facet arthropathy 05/09/2018   Chronic low back pain (Bilateral (L>R) w/o sciatica 03/14/2018   Chronic hip pain (Left) 03/14/2018   Lumbar facet syndrome (Bilateral) (L>R) 03/14/2018   Idiopathic scoliosis 03/14/2018   Chronic sacroiliac joint pain (Left) 03/14/2018   Chronic pain of knee (Right) 03/14/2018   Chronic upper extremity pain (3ry area of Pain) (Bilateral) (R>L) 02/14/2018   Cancer-related pain 02/14/2018   Vitamin D  deficiency 02/14/2018   Osteoarthritis of knee (Right) 02/14/2018   Type 2 diabetes mellitus with hyperglycemia, with long-term current use of insulin  (HCC) 01/23/2018   Chronic feet pain (1ry area of Pain) (Bilateral) (R>L) 01/22/2018   Neuropathic pain of feet (Bilateral) 01/22/2018   Chronic knee pain (2ry area of Pain) (Bilateral) (R>L) 01/22/2018   Chronic hand pain (3ry area of Pain) (Bilateral) (R>L) 01/22/2018   Chronic pain syndrome 01/22/2018   Pharmacologic therapy 01/22/2018   Disorder of skeletal system 01/22/2018  Problems influencing health status 01/22/2018   Chronic hip pain (4th area of Pain) (Bilateral) (R>L) 01/22/2018   Malignant neoplasm of lower-outer quadrant of left breast of female, estrogen receptor positive (HCC) 11/08/2017   Chemotherapy-induced neuropathy 10/19/2017   Cervical polyp 07/05/2017   Family history of breast cancer    Hyperlipidemia associated with type 2 diabetes mellitus (HCC) 03/03/2017   Nicotine dependence, cigarettes, w unsp disorders 11/15/2016    Chronic fatigue 03/30/2015   Severe depression (HCC) 03/30/2015    Past Surgical History:  Procedure Laterality Date   AXILLARY LYMPH NODE BIOPSY Left 07/14/2017   Procedure: INSERTION GEL MARK CLIP LEFT AXILLA;  Surgeon: Dessa Reyes ORN, MD;  Location: [facility];  Service: General;  Laterality: Left;   BREAST BIOPSY Left    Dr Curtis BREAST METASTATIC CARCINOMA   BREAST LUMPECTOMY Left 01/12/2018   COLONOSCOPY WITH PROPOFOL  N/A 12/27/2019   Procedure: COLONOSCOPY WITH PROPOFOL ;  Surgeon: Janalyn Keene NOVAK, MD;  Location: [facility];  Service: Endoscopy;  Laterality: N/A;   COLONOSCOPY WITH PROPOFOL  N/A 01/05/2022   Procedure: COLONOSCOPY WITH PROPOFOL ;  Surgeon: Unk Corinn Skiff, MD;  Location: [facility] ENDOSCOPY;  Service: Gastroenterology;  Laterality: N/A;   OOPHORECTOMY     PARTIAL MASTECTOMY WITH NEEDLE LOCALIZATION Left 01/12/2018   Procedure: PARTIAL MASTECTOMY WITH NEEDLE LOCALIZATION;  Surgeon: Dessa Reyes ORN, MD;  Location: [facility];  Service: General;  Laterality: Left;   PORTACATH PLACEMENT Right 07/14/2017   Procedure: INSERTION PORT-A-CATH;  Surgeon: Dessa Reyes ORN, MD;  Location: [facility];  Service: General;  Laterality: Right;   SENTINEL NODE BIOPSY Left 01/12/2018   Procedure: SENTINEL NODE BIOPSY;  Surgeon: Dessa Reyes ORN, MD;  Location: [facility];  Service: General;  Laterality: Left;   TUBAL LIGATION      OB History     Gravida  3   Para  3   Term  0   Preterm  0   AB  0   Living         SAB  0   IAB  0   Ectopic  0   Multiple      Live Births           Obstetric Comments  1st Menstrual Cycle:  14  1st Pregnancy:  21           Home Medications    Prior to Admission medications   Medication Sig Start Date End Date Taking? Authorizing Provider  aspirin -acetaminophen -caffeine (EXCEDRIN MIGRAINE) 250-250-65 MG tablet Take 2 tablets by mouth daily as needed for headache.   Yes [provider]  Blood  Glucose Monitoring Suppl (ONETOUCH VERIO) w/Device KIT Use to check blood sugar 3 times a day and document results, bring to appointments.  Goal is <130 fasting blood sugar and <180 two hours after meals. 02/12/21  Yes Cannady, Jolene T, NP  buPROPion  (WELLBUTRIN  XL) 300 MG 24 hr tablet Take 1 tablet (300 mg total) by mouth daily. 04/03/23  Yes Cannady, Jolene T, NP  busPIRone  (BUSPAR ) 5 MG tablet TAKE 1 TABLET BY MOUTH TWICE A DAY 01/09/24  Yes Cannady, Jolene T, NP  Continuous Glucose Sensor (FREESTYLE LIBRE 3 PLUS SENSOR) MISC by Does not apply route. Change sensor every 15 days.   Yes [provider]  dapagliflozin  propanediol (FARXIGA ) 10 MG TABS tablet TAKE 1 TABLET BY MOUTH DAILY BEFORE BREAKFAST. 01/10/24  Yes Cannady, Jolene T, NP  DULoxetine  (CYMBALTA ) 60 MG capsule TAKE 1 CAPSULE BY MOUTH EVERY DAY 01/10/24  Yes Cannady, Jolene T, NP  famotidine  (PEPCID ) 20 MG tablet TAKE 1 TABLET BY MOUTH EVERY DAY 07/04/23  Yes Cannady, Jolene T, NP  gabapentin  (NEURONTIN ) 300 MG capsule Take 300 MG (one capsule) by mouth in the morning and then take 600 MG (two capsules) by mouth in the evening. 07/05/23  Yes Cannady, Jolene T, NP  glucose blood test strip Use to check blood sugar 3 times daily, fasting in morning with goal <130 and 2 hours after meals with goal <180.  Bring blood sugar log to visits. 11/11/20  Yes Cannady, Jolene T, NP  insulin  glargine (LANTUS  SOLOSTAR) 100 UNIT/ML Solostar Pen Inject 60 Units into the skin daily. To replaced order sent 9/8 for vials. 11/17/23  Yes Cannady, Jolene T, NP  Insulin  Pen Needle (BD PEN NEEDLE NANO 2ND GEN) 32G X 4 MM MISC For use with insulin  pens daily. 05/30/23  Yes Cannady, Jolene T, NP  letrozole  (FEMARA ) 2.5 MG tablet TAKE 1 TABLET BY MOUTH EVERY DAY 11/29/23  Yes Brahmanday, Govinda R, MD  meloxicam  (MOBIC ) 15 MG tablet TAKE 1 TABLET (15 MG TOTAL) BY MOUTH DAILY. 09/29/23  Yes Cannady, Jolene T, NP  metFORMIN  (GLUCOPHAGE ) 1000 MG tablet Take 1 tablet  (1,000 mg total) by mouth 2 (two) times daily with a meal. 10/16/23  Yes Cannady, Jolene T, NP  ondansetron  (ZOFRAN ) 8 MG tablet Take 1 tablet (8 mg total) by mouth 2 (two) times daily as needed for nausea or vomiting. 05/30/22  Yes Cannady, Jolene T, NP  oxyCODONE  (OXY IR/ROXICODONE ) 5 MG immediate release tablet Take 1 tablet (5 mg total) by mouth 2 (two) times daily as needed for severe pain (pain score 7-10). Must last 30 days 12/13/23 01/12/24 Yes Patel, Seema K, NP  oxyCODONE  (OXY IR/ROXICODONE ) 5 MG immediate release tablet Take 1 tablet (5 mg total) by mouth 2 (two) times daily as needed for severe pain (pain score 7-10). Must last 30 days 01/12/24 02/11/24 Yes Patel, Seema K, NP  oxyCODONE  (OXY IR/ROXICODONE ) 5 MG immediate release tablet Take 1 tablet (5 mg total) by mouth 2 (two) times daily as needed for severe pain (pain score 7-10). Must last 30 days 02/11/24 03/12/24 Yes Patel, Seema K, NP  rosuvastatin  (CRESTOR ) 40 MG tablet Take 1 tablet (40 mg total) by mouth daily. 10/16/23  Yes Cannady, Jolene T, NP  Semaglutide  (RYBELSUS ) 7 MG TABS Take 1 tablet (7 mg total) by mouth daily. 11/17/23  Yes Cannady, Jolene T, NP  SUMAtriptan  (IMITREX ) 100 MG tablet TAKE 1 TAB BY MOUTH EVERY 2 HOURS AS NEEDED FOR MIGRAINE. MAY REPEAT IN 2 HOURS IF HEADACHE PERSISTS OR RECURS. 01/10/24  Yes Cannady, Melanie DASEN, NP    Family History Family History  Problem Relation Age of Onset   Colon cancer Mother    Cancer Mother    Other Father        family hx on dad's side: breast, colon, stomach cancer-biological dad   Diabetes Brother    Pancreatitis Brother    Prostate cancer Brother 93       currently 51 / maternal half-brother   Breast cancer Maternal Aunt 44       currently 29   Breast cancer Maternal Grandmother 40       deceased 41s   Colon cancer Maternal Grandmother    Breast cancer Other 68       mother's sister; deceased 55   Breast cancer Other        mother's sister; age  at dx unknown    Social  History Social History   Tobacco Use   Smoking status: Former    Average packs/day: 1 pack/day for 11.0 years (11.0 ttl pk-yrs)    Types: Cigarettes    Start date: 01/07/2012   Smokeless tobacco: Former    Types: Snuff  Vaping Use   Vaping status: Every Day  Substance Use Topics   Alcohol use: No    Alcohol/week: 0.0 standard drinks of alcohol   Drug use: No     Allergies   Aspirin    Review of Systems Review of Systems  Gastrointestinal:  Positive for abdominal pain, nausea and vomiting.     Physical Exam Triage Vital Signs ED Triage Vitals  Encounter Vitals Group     BP      Girls Systolic BP Percentile      Girls Diastolic BP Percentile      Boys Systolic BP Percentile      Boys Diastolic BP Percentile      Pulse      Resp      Temp      Temp src      SpO2      Weight      Height      Head Circumference      Peak Flow      Pain Score      Pain Loc      Pain Education      Exclude from Growth Chart    No data found.  Updated Vital Signs BP 115/80 (BP Location: Left Arm)   Pulse 91   Temp 98.4 F (36.9 C) (Oral)   Wt 190 lb 3.2 oz (86.3 kg)   LMP  (LMP Unknown) Comment: LAST PERIOD IN MAY 2019 WHEN SHE STARTED CHEMO  SpO2 93%   BMI 32.65 kg/m   Visual Acuity Right Eye Distance:   Left Eye Distance:   Bilateral Distance:    Right Eye Near:   Left Eye Near:    Bilateral Near:     Physical Exam Vitals and nursing note reviewed.  Constitutional:      Appearance: Normal appearance. She is ill-appearing.  HENT:     Head: Normocephalic and atraumatic.  Cardiovascular:     Rate and Rhythm: Normal rate and regular rhythm.     Pulses: Normal pulses.     Heart sounds: Normal heart sounds. No murmur heard.    No friction rub. No gallop.  Pulmonary:     Effort: Pulmonary effort is normal.     Breath sounds: Normal breath sounds. No wheezing, rhonchi or rales.  Abdominal:     General: There is distension.     Palpations: Abdomen is soft.      Tenderness: There is abdominal tenderness. There is no guarding or rebound.  Skin:    General: Skin is warm and dry.     Capillary Refill: Capillary refill takes less than 2 seconds.     Findings: No rash.  Neurological:     General: No focal deficit present.     Mental Status: She is alert and oriented to person, place, and time.      UC Treatments / Results  Labs (all labs ordered are listed, but only abnormal results are displayed) Labs Reviewed  GLUCOSE, POCT (MANUAL RESULT ENTRY) - Abnormal; Notable for the following components:      Result Value   POCT Glucose ([facility]) 182 (*)    All other components within normal  limits    EKG   Radiology No results found.  Procedures Procedures (including critical care time)  Medications Ordered in UC Medications - No data to display  Initial Impression / Assessment and Plan / UC Course  I have reviewed the triage vital signs and the nursing notes.  Pertinent labs & imaging results that were available during my care of the patient were reviewed by me and considered in my medical decision making (see chart for details).   Patient is a pleasant, though ill-appearing, 58 year old female presenting for evaluation of abdominal pain, Donnell distention, nausea and vomiting that has been going on for the last 7 days.  She reports that she vomits all day and is not able to keep down any food and limited fluids.  She reports that when she does eat she will end up throwing up undigested food that she ate 2 days later.  She has a longstanding issue of constipation reports that her last full bowel movement was 1.5 to 2 weeks ago.  She did have a small bowel movement 2 days ago.  She is a type II diabetic who is on Lantus  at night.  She is unsure what her blood sugars have been running as she is out of CGM monitors.  Fingerstick glucose is 182.  Patient's abdomen is distended, soft, with generalized tenderness.  I am concerned that she may have a  small bowel obstruction.  Additionally, she needs lab work to assess for electrolytes and her acid-base balance to ensure she is not ending up in a hyperosmolar state given her type 2 diabetes.  She and her husband have elected to go to [facility].   Final Clinical Impressions(s) / UC Diagnoses   Final diagnoses:  Type 2 diabetes mellitus with hyperglycemia, with long-term current use of insulin  (HCC)  Nausea and vomiting, unspecified vomiting type     Discharge Instructions      Please go to the emergency department at [facility] to be evaluated for possible bowel obstruction.  Please go and help.     ED Prescriptions   None    PDMP not reviewed this encounter.   Bernardino Ditch, NP 01/11/24 1626

## 2024-01-11 NOTE — ED Provider Notes (Signed)
 [facility] Provider Note    Event Date/Time   First MD Initiated Contact with Patient 01/11/24 1733     (approximate)   History   Emesis   HPI  Carolyn Roy is a 58 y.o. female with breast cancer now only on hormonal therapy (patient denies being on active chemotherapy), type 2 diabetes who presents with 7 days of nausea and vomiting and abdominal bloating and epigastric pain.  Patient was seen at urgent care facility today and was subsequently sent to our facility for imaging.  Patient reports last bowel movement was this morning which was small and hard.  Denies any blood in her emesis or bowel movements.  Denies any chest pain or shortness of breath.  She presents with her husband who helps contribute to the history      Physical Exam   Triage Vital Signs: ED Triage Vitals [01/11/24 1652]  Encounter Vitals Group     BP (!) 147/94     Girls Systolic BP Percentile      Girls Diastolic BP Percentile      Boys Systolic BP Percentile      Boys Diastolic BP Percentile      Pulse Rate 96     Resp 16     Temp 98.3 F (36.8 C)     Temp src      SpO2 97 %     Weight 189 lb 9.5 oz (86 kg)     Height 5' 4 (1.626 m)     Head Circumference      Peak Flow      Pain Score 5     Pain Loc      Pain Education      Exclude from Growth Chart     Most recent vital signs: Vitals:   01/11/24 1652 01/11/24 2008  BP: (!) 147/94 (!) 151/91  Pulse: 96 81  Resp: 16 16  Temp: 98.3 F (36.8 C) 98.3 F (36.8 C)  SpO2: 97% 98%    Nursing Triage Note reviewed. Vital signs reviewed and patients oxygen saturation is normoxic  General: Patient is well nourished, well developed, awake and alert, resting comfortably in no acute distress Head: Normocephalic and atraumatic Eyes: Normal inspection, extraocular muscles intact, no conjunctival pallor Ear, nose, throat: Normal external exam Neck: Normal range of motion Respiratory: Patient is in no respiratory  distress, lungs CTAB Cardiovascular: Patient is borderline tachycardic, RR without murmur appreciated GI: Abd soft, general mild tenderness to palpation with no guarding or rebound no CVA tenderness to palpation Back: Normal inspection of the back with good strength and range of motion throughout all ext Extremities: pulses intact with good cap refills, no LE pitting edema or calf tenderness Neuro: The patient is alert and oriented to person, place, and time, appropriately conversive, with 5/5 bilat UE/LE strength, no gross motor or sensory defects noted. Coordination appears to be adequate. Skin: Warm, dry, and intact Psych: normal mood and affect, no SI or HI  ED Results / Procedures / Treatments   Labs (all labs ordered are listed, but only abnormal results are displayed) Labs Reviewed  COMPREHENSIVE METABOLIC PANEL WITH GFR - Abnormal; Notable for the following components:      Result Value   Sodium 146 (*)    Glucose, Bld 189 (*)    Creatinine, Ser 1.21 (*)    Calcium  10.6 (*)    GFR, Estimated 52 (*)    All other components within normal limits  URINALYSIS, ROUTINE W REFLEX MICROSCOPIC - Abnormal; Notable for the following components:   Color, Urine YELLOW (*)    APPearance HAZY (*)    Specific Gravity, Urine 1.032 (*)    Glucose, UA >=500 (*)    Hgb urine dipstick SMALL (*)    Protein, ur 100 (*)    All other components within normal limits  LIPASE, BLOOD  CBC     EKG EKG and rhythm strip are interpreted by myself:   EKG: [Normal sinus rhythm] at heart rate of 78, normal QRS duration, QTc 450 (read is 623 however this is secondary to poor baseline and I do not think it is accurate), nonspecific ST segments and T waves no ectopy EKG not consistent with Acute STEMI Rhythm strip: Normal sinus rhythm in lead II   RADIOLOGY CT abd and pelvis with iv contrast: Demonstrated moderate stool burden but no impaction on my independent review interpretation radiologist  agrees    PROCEDURES:  Critical Care performed: No  Procedures   MEDICATIONS ORDERED IN ED: Medications  sodium chloride  0.9 % bolus 1,000 mL (0 mLs Intravenous Stopped 01/11/24 2008)  famotidine  (PEPCID ) IVPB 20 mg premix (0 mg Intravenous Stopped 01/11/24 1848)  iohexol  (OMNIPAQUE ) 300 MG/ML solution 100 mL (100 mLs Intravenous Contrast Given 01/11/24 1823)     IMPRESSION / MDM / ASSESSMENT AND PLAN / ED COURSE                                Differential diagnosis includes, but is not limited to, bowel obstruction, colitis, enteritis, DKA, UTI, electrolyte derangement   ED course: Patient is well-appearing and abdominal exam demonstrates no evidence of peritonitis.  She does not have a leukocytosis or profound electrolyte derangements.  Her creatinine is slightly more elevated than baseline at 1.21 and she was given IV fluids.  Urinalysis was not consistent with UTI.  She had no elevation of her liver function tests or lipase.  A CT abdomen pelvis with IV contrast is pending.  She did receive Pepcid  for her symptoms  Clinical Course as of 01/12/24 0004  Thu Jan 11, 2024  1738 Ketones, ur: NEGATIVE No ketones [HD]  1738 CO2: 30 No derangement of CO2 [HD]  1943 Patient reexamined and has been able to take p.o. patient's abdominal exam reassuring.  She feels comfortable returning home.  I did offer her a smog enema here at our facility but she would prefer to pursue a bowel regimen in the comfort of her own home which I think is reasonable at this time.  Return precautions given and patient voiced understanding and requested discharge [HD]    Clinical Course User Index [HD] Nicholaus Rolland BRAVO, MD   At time of discharge there is no evidence of acute life, limb, vision, or fertility threat. Patient has stable vital signs, pain is well controlled, patient is ambulatory and p.o. tolerant.  Discharge instructions were completed using the EPIC system. I would refer you to those at this  time. All warnings prescriptions follow-up etc. were discussed in detail with the patient. Patient indicates understanding and is agreeable with this plan. All questions answered.  Patient is made aware that they may return to the emergency department for any worsening or new condition or for any other emergency.   -- Risk: 5 This patient has a high risk of morbidity due to further diagnostic testing or treatment. Rationale: This patient's evaluation and management involve  a high risk of morbidity due to the potential severity of presenting symptoms, need for diagnostic testing, and/or initiation of treatment that may require close monitoring. The differential includes conditions with potential for significant deterioration or requiring escalation of care. Treatment decisions in the ED, including medication administration, procedural interventions, or disposition planning, reflect this level of risk. COPA: 5 The patient has the following acute or chronic illness/injury that poses a possible threat to life or bodily function: [X] : The patient has a potentially serious acute condition or an acute exacerbation of a chronic illness requiring urgent evaluation and management in the Emergency Department. The clinical presentation necessitates immediate consideration of life-threatening or function-threatening diagnoses, even if they are ultimately ruled out.   FINAL CLINICAL IMPRESSION(S) / ED DIAGNOSES   Final diagnoses:  Abdominal distension  Nausea and vomiting, unspecified vomiting type  Constipation, unspecified constipation type     Rx / DC Orders   ED Discharge Orders          Ordered    sodium phosphate  (FLEET) ENEM  Daily PRN        01/11/24 1945    polyethylene glycol (MIRALAX ) 17 g packet  Daily        01/11/24 1945    bisacodyl (DULCOLAX) 5 MG EC tablet  Daily PRN        01/11/24 1945             Note:  This document was prepared using Dragon voice recognition software  and may include unintentional dictation errors.   Nicholaus Rolland BRAVO, MD 01/12/24 MIKI

## 2024-01-12 NOTE — ED Provider Notes (Incomplete)
 [facility] Provider Note    Event Date/Time   First MD Initiated Contact with Patient 01/11/24 1733     (approximate)   History   Emesis   HPI  Carolyn Roy is a 58 y.o. female with breast cancer now only on hormonal therapy (patient denies being on active chemotherapy), type 2 diabetes who presents with 7 days of nausea and vomiting and abdominal bloating and epigastric pain.  Patient was seen at urgent care facility today and was subsequently sent to our facility for imaging.  Patient reports last bowel movement was this morning which was small and hard.  Denies any blood in her emesis or bowel movements.  Denies any chest pain or shortness of breath.  She presents with her husband who helps contribute to the history      Physical Exam   Triage Vital Signs: ED Triage Vitals [01/11/24 1652]  Encounter Vitals Group     BP (!) 147/94     Girls Systolic BP Percentile      Girls Diastolic BP Percentile      Boys Systolic BP Percentile      Boys Diastolic BP Percentile      Pulse Rate 96     Resp 16     Temp 98.3 F (36.8 C)     Temp src      SpO2 97 %     Weight 189 lb 9.5 oz (86 kg)     Height 5' 4 (1.626 m)     Head Circumference      Peak Flow      Pain Score 5     Pain Loc      Pain Education      Exclude from Growth Chart     Most recent vital signs: Vitals:   01/11/24 1652 01/11/24 2008  BP: (!) 147/94 (!) 151/91  Pulse: 96 81  Resp: 16 16  Temp: 98.3 F (36.8 C) 98.3 F (36.8 C)  SpO2: 97% 98%    Nursing Triage Note reviewed. Vital signs reviewed and patients oxygen saturation is normoxic  General: Patient is well nourished, well developed, awake and alert, resting comfortably in no acute distress Head: Normocephalic and atraumatic Eyes: Normal inspection, extraocular muscles intact, no conjunctival pallor Ear, nose, throat: Normal external exam Neck: Normal range of motion Respiratory: Patient is in no respiratory  distress, lungs CTAB Cardiovascular: Patient is borderline tachycardic, RR without murmur appreciated GI: Abd soft, general mild tenderness to palpation with no guarding or rebound no CVA tenderness to palpation Back: Normal inspection of the back with good strength and range of motion throughout all ext Extremities: pulses intact with good cap refills, no LE pitting edema or calf tenderness Neuro: The patient is alert and oriented to person, place, and time, appropriately conversive, with 5/5 bilat UE/LE strength, no gross motor or sensory defects noted. Coordination appears to be adequate. Skin: Warm, dry, and intact Psych: normal mood and affect, no SI or HI  ED Results / Procedures / Treatments   Labs (all labs ordered are listed, but only abnormal results are displayed) Labs Reviewed  COMPREHENSIVE METABOLIC PANEL WITH GFR - Abnormal; Notable for the following components:      Result Value   Sodium 146 (*)    Glucose, Bld 189 (*)    Creatinine, Ser 1.21 (*)    Calcium  10.6 (*)    GFR, Estimated 52 (*)    All other components within normal limits  URINALYSIS, ROUTINE W REFLEX MICROSCOPIC - Abnormal; Notable for the following components:   Color, Urine YELLOW (*)    APPearance HAZY (*)    Specific Gravity, Urine 1.032 (*)    Glucose, UA >=500 (*)    Hgb urine dipstick SMALL (*)    Protein, ur 100 (*)    All other components within normal limits  LIPASE, BLOOD  CBC     EKG EKG and rhythm strip are interpreted by myself:   EKG: [Normal sinus rhythm] at heart rate of 78, normal QRS duration, QTc 450 (read is 623 however this is secondary to poor baseline and I do not think it is accurate), nonspecific ST segments and T waves no ectopy EKG not consistent with Acute STEMI Rhythm strip: Normal sinus rhythm in lead II   RADIOLOGY CT abd and pelvis with iv contrast    PROCEDURES:  Critical Care performed: No  Procedures   MEDICATIONS ORDERED IN ED: Medications   sodium chloride  0.9 % bolus 1,000 mL (0 mLs Intravenous Stopped 01/11/24 2008)  famotidine  (PEPCID ) IVPB 20 mg premix (0 mg Intravenous Stopped 01/11/24 1848)  iohexol  (OMNIPAQUE ) 300 MG/ML solution 100 mL (100 mLs Intravenous Contrast Given 01/11/24 1823)     IMPRESSION / MDM / ASSESSMENT AND PLAN / ED COURSE                                Differential diagnosis includes, but is not limited to, bowel obstruction, colitis, enteritis, DKA, UTI, electrolyte derangement   ED course: Patient is well-appearing and abdominal exam demonstrates no evidence of peritonitis.  She does not have a leukocytosis or profound electrolyte derangements.  Her creatinine is slightly more elevated than baseline at 1.21 and she was given IV fluids.  Urinalysis was not consistent with UTI.  She had no elevation of her liver function tests or lipase.  A CT abdomen pelvis with IV contrast is pending.  She did receive Pepcid  for her symptoms  Clinical Course as of 01/12/24 0004  Thu Jan 11, 2024  1738 Ketones, ur: NEGATIVE No ketones [HD]  1738 CO2: 30 No derangement of CO2 [HD]  1943 Patient reexamined and has been able to take p.o. patient's abdominal exam reassuring.  She feels comfortable returning home.  I did offer her a smog enema here at our facility but she would prefer to pursue a bowel regimen in the comfort of her own home which I think is reasonable at this time.  Return precautions given and patient voiced understanding and requested discharge [HD]    Clinical Course User Index [HD] Nicholaus Rolland BRAVO, MD   -- Risk: 5 This patient has a high risk of morbidity due to further diagnostic testing or treatment. Rationale: This patient's evaluation and management involve a high risk of morbidity due to the potential severity of presenting symptoms, need for diagnostic testing, and/or initiation of treatment that may require close monitoring. The differential includes conditions with potential for significant  deterioration or requiring escalation of care. Treatment decisions in the ED, including medication administration, procedural interventions, or disposition planning, reflect this level of risk. COPA: 5 The patient has the following acute or chronic illness/injury that poses a possible threat to life or bodily function: [X] : The patient has a potentially serious acute condition or an acute exacerbation of a chronic illness requiring urgent evaluation and management in the Emergency Department. The clinical presentation necessitates immediate consideration of life-threatening  or function-threatening diagnoses, even if they are ultimately ruled out.   FINAL CLINICAL IMPRESSION(S) / ED DIAGNOSES   Final diagnoses:  Abdominal distension  Nausea and vomiting, unspecified vomiting type  Constipation, unspecified constipation type     Rx / DC Orders   ED Discharge Orders          Ordered    sodium phosphate  (FLEET) ENEM  Daily PRN        01/11/24 1945    polyethylene glycol (MIRALAX) 17 g packet  Daily        01/11/24 1945    bisacodyl (DULCOLAX) 5 MG EC tablet  Daily PRN        01/11/24 1945             Note:  This document was prepared using Dragon voice recognition software and may include unintentional dictation errors.

## 2024-01-16 ENCOUNTER — Encounter: Payer: Self-pay | Admitting: Nurse Practitioner

## 2024-01-16 ENCOUNTER — Ambulatory Visit: Admitting: Nurse Practitioner

## 2024-01-16 VITALS — BP 93/67 | HR 92 | Temp 97.9°F | Resp 18 | Ht 64.02 in | Wt 188.0 lb

## 2024-01-16 DIAGNOSIS — F322 Major depressive disorder, single episode, severe without psychotic features: Secondary | ICD-10-CM

## 2024-01-16 DIAGNOSIS — F19982 Other psychoactive substance use, unspecified with psychoactive substance-induced sleep disorder: Secondary | ICD-10-CM

## 2024-01-16 DIAGNOSIS — E66812 Obesity, class 2: Secondary | ICD-10-CM

## 2024-01-16 DIAGNOSIS — Z794 Long term (current) use of insulin: Secondary | ICD-10-CM

## 2024-01-16 DIAGNOSIS — R112 Nausea with vomiting, unspecified: Secondary | ICD-10-CM | POA: Insufficient documentation

## 2024-01-16 DIAGNOSIS — C50512 Malignant neoplasm of lower-outer quadrant of left female breast: Secondary | ICD-10-CM

## 2024-01-16 DIAGNOSIS — F17219 Nicotine dependence, cigarettes, with unspecified nicotine-induced disorders: Secondary | ICD-10-CM

## 2024-01-16 DIAGNOSIS — F1120 Opioid dependence, uncomplicated: Secondary | ICD-10-CM

## 2024-01-16 DIAGNOSIS — I951 Orthostatic hypotension: Secondary | ICD-10-CM

## 2024-01-16 DIAGNOSIS — E1169 Type 2 diabetes mellitus with other specified complication: Secondary | ICD-10-CM

## 2024-01-16 LAB — BAYER DCA HB A1C WAIVED: HB A1C (BAYER DCA - WAIVED): 8 % — ABNORMAL HIGH (ref 4.8–5.6)

## 2024-01-16 MED ORDER — PROMETHAZINE HCL 25 MG PO TABS: 25.0000 mg | ORAL_TABLET | Freq: Four times a day (QID) | ORAL | 0 refills | Status: AC | PRN

## 2024-01-16 MED ORDER — INSULIN LISPRO (1 UNIT DIAL) 100 UNIT/ML (KWIKPEN): PEN_INJECTOR | SUBCUTANEOUS | 2 refills | Status: AC

## 2024-01-16 NOTE — Assessment & Plan Note (Signed)
 Chronic, ongoing.  Continue Rosuvastatin 40 MG daily.  Lipid panel today and adjust dose as needed.

## 2024-01-16 NOTE — Assessment & Plan Note (Signed)
 Chronic, ongoing. Denies SI/HI. Is following with Dr. Chipper again, appreciate her input. Continue current medication regimen and adjust as needed.

## 2024-01-16 NOTE — Assessment & Plan Note (Signed)
 Ongoing, stable.  Continue collaboration with oncology, recent note reviewed. Port remains in place, but no treatments.

## 2024-01-16 NOTE — Assessment & Plan Note (Addendum)
 Over past few weeks. Will stop Rybelsus  as suspect some is related to this, has not felt well since started and did not tolerate Trulicity  in past. Recommend she continue Dulcolax for now and take daily, may need Lubiprostone in future for chronic constipation -- takes opioid daily. Phenergan  sent in for nausea since Zofran  offering no benefit at present. Labs today: CBC, CMP, Amylase, Lipase, GGT, H.Pylori stool. Determine next steps after all labs returned. Strict ER precautions given.

## 2024-01-16 NOTE — Assessment & Plan Note (Signed)
 Chronic, ongoing.  A1c remains 8% today.  Feeding whole family and healthier items are costly.  Getting assistance with Farxiga . Appreciate Channing PharmD with [facility] for her assistance, recent recommendations reviewed and discussed with patient. Since starting Rybelsus  has not been feeling 100%, will stop this due to recent worsening N&V with constipation. Did not tolerate Trulicity  in past. -  Start Humalog  5-6 units at supper time, biggest meal, to start. Discussed with her today. If blood sugars remain consistently over 180 two hours after dinner over the next 3 to 5 days then increase Humalog  by 1 to 2 units. Do not take if do not eat. Educated her at length today on this. -  Continue Lantus  60 units and back down to 55 units if too many lows <70 with this.  -  Will continue Metformin  at current dosing. Continue Farxiga . -  Recommend monitor BS at home TID and heavily focus on diet changes.  -  Urine ALB 150 February 2025, cannot take ACE/ARB due to hypotension.  Statin on board.   -  Eye exam and foot exam up to date. -  Return in 3 months.  Will have return to endo if note ongoing poor control, but suspect with current team approach with PharmD and PCP + patient will be able to get A1c to goal.

## 2024-01-16 NOTE — Assessment & Plan Note (Signed)
 Ongoing, stable. Recommend to continue to take plenty of water intake daily and add some salt to diet + wear compression on during day and off at night. Has seen cardiology with overall stable work-up. Would avoid HTN medications.

## 2024-01-16 NOTE — Assessment & Plan Note (Signed)
 Chronic, stable.  Followed by pain management, continue this collaboration, recent notes reviewed.

## 2024-01-16 NOTE — Assessment & Plan Note (Signed)
 Chronic, stable.  Will continue Gabapentin , other medications caused too much fatigue.  Benefit to sleep and to migraines and chronic pain.

## 2024-01-16 NOTE — Progress Notes (Signed)
 BP 93/67 (BP Location: Left Arm, Patient Position: Sitting, Cuff Size: Large)   Pulse 92   Temp 97.9 F (36.6 C) (Oral)   Resp 18   Ht 5' 4.02 (1.626 m)   Wt 188 lb (85.3 kg)   LMP  (LMP Unknown) Comment: LAST PERIOD IN MAY 2019 WHEN SHE STARTED CHEMO  SpO2 98%   BMI 32.25 kg/m    Subjective:    Patient ID: Carolyn Roy, female    DOB: 10/19/1965, 58 y.o.   MRN: [id number]  HPI: Carolyn Roy is a 57 y.o. female  Chief Complaint  Patient presents with   Hospitalization Follow-up    Abdominal distension Nausea and vomiting, unspecified vomiting type Constipation, unspecified constipation type Since she has been home-Not feeling any better. Vomiting every time she eats and drinks. Started feeling ill on Thanksgiving.    Depression    Was feeling ok prior to feeling sick but does get stuck in her head. Husband made her come to her appointment.    Diabetes    BG has been running very high last 2 weeks, currently at 266.    Nicotine Dependence    Quit vaping on Thanksgiving and does not feel she will pick it back up.    ER FOLLOW UP Seen in ER on 01/11/24 due to being ill since after Thanksgiving -- could not eat a lot of Thanksgiving day. Initially went to [facility] and they sent her to ER due to symptoms and need for imaging. Continues to have abdominal distension and N&V with constipation. Not feeling any better. Every time she eats and drinks she vomits + medication coming back up. Imaging, CT, in ER noted moderate fecal burden. Lipase was WNL. Overall labs reassuring. Rybelsus  was started 10/16/23 - does not seem to be handling this well per her report, does not feel well with it. She reports at baseline has constipation, this runs in her family. Has been taking Dulcolax orally, had BM last night. Not a big one. No straining with it. Has been taking Zofran , but this is not helping. Time since discharge: 5 days ago Hospital/facility: [facility] Diagnosis: abdominal  distension Procedures/tests: as above Consultants: none New medications: Dulcolax Discharge instructions:  follow-up with PCP Status: fluctuating   DIABETES September A1c 8%. Takes Metformin  1000 MG BID, Rybelsus  7 MG daily (review above), Farxiga  10 MG daily, and Lantus  60 units in the morning.  Works with Channing PharmD on savings and costs. Her current biggest meal is supper time.  Freestyle over past 90 days: Average glucose 193, 40% in range, 59% high, and 1% low.  On review trends up 6 am to 6 pm. In past followed with endo, but has not had visit with them in some time. Hypoglycemic episodes:no Polydipsia/polyuria: no Visual disturbance: no Chest pain: no Paresthesias: no Glucose Monitoring: wearing CGM as above  Accucheck frequency:   Fasting glucose:   Post prandial:   Evening:   Before meals:  Taking Insulin ?: yes  Long acting insulin : 60 units  Short acting insulin : Blood Pressure Monitoring: not checking Retinal Examination: Up To Date -- Dr. Mevelyn New Exam: Up to Date Pneumovax: Up To Date Influenza: Up To Date Aspirin : no   HYPERLIPIDEMIA Takes Rosuvastatin  40 MG daily. Saw cardiology in 2023 for hypotension, reassuring work-up. Quit smoking cigarettes at the end of November 2024, but then started vaping.  Quit vaping 2 weeks ago. Started smoking at age 72, smoked >35 years total. In past did quit  for 6 years, but then started back.  Satisfied with current treatment? yes Duration of hyperlipidemia: chronic Cholesterol medication side effects: no Cholesterol supplements: none Aspirin : no Recent stressors: no Recurrent headaches: no Visual changes: no Palpitations: no Dyspnea: due to recent vomiting on occasion Chest pain: no Lower extremity edema: no Dizzy/lightheaded: no  The 10-year ASCVD risk score (Arnett DK, et al., 2019) is: 2.8%   Values used to calculate the score:     Age: 14 years     Clincally relevant sex: Female     Is Non-Hispanic  African American: No     Diabetic: Yes     Tobacco smoker: No     Systolic Blood Pressure: 93 mmHg     Is BP treated: No     HDL Cholesterol: 51 mg/dL     Total Cholesterol: 227 mg/dL   CHRONIC PAIN  Takes Oxycodone  and Gabapentin . Saw pain management on 11/20/23. Pain control status: stable Duration: chronic Location: back, knees, and hands Quality: dull, aching, and throbbing Current Pain Level: 1/10 Previous Pain Level: 10/10 Breakthrough pain: no Benefit from narcotic medications: yes What Activities task can be accomplished with current medication? Able to work and perform ADLs Interested in weaning off narcotics:no   Stool softners/OTC fiber: yes  Previous pain specialty evaluation: yes Non-narcotic analgesic meds: no Narcotic contract: yes   DEPRESSION Taking Wellbutrin , Buspar , and Duloxetine . Saw Dr. Chipper last on 01/03/24 and therapy was ordered - this has started.  Lost youngest step son of family, 52 years old, in March 2025 and job loss.  Took Amitriptyline  in past, but this made her too tired.  Breast cancer history, last saw oncology 08/29/23. Gets Zometa  for bone density. Still has port in place. Mood status: stable  Satisfied with current treatment?: yes Symptom severity: stable to mild  Duration of current treatment : chronic Side effects: no Medication compliance: good compliance Psychotherapy/counseling: none Depressed mood: yes Anxious mood: yes Anhedonia: no Significant weight loss or gain: no Insomnia: sleeping too much since being sick Fatigue: sometimes Feelings of worthlessness or guilt: no Impaired concentration/indecisiveness: no Suicidal ideations: no Hopelessness: no Crying spells: yes    01/16/2024    2:38 PM 11/20/2023   11:37 AM 10/16/2023   10:33 AM 08/29/2023    2:48 PM 08/21/2023   11:57 AM  Depression screen PHQ 2/9  Decreased Interest 3 0 1 0 0  Down, Depressed, Hopeless 0 0 0 0 0  PHQ - 2 Score 3 0 1 0 0  Altered sleeping 3  2     Tired, decreased energy 3  1    Change in appetite 3  3    Feeling bad or failure about yourself  0  0    Trouble concentrating 0  1    Moving slowly or fidgety/restless 1  3    Suicidal thoughts 0  0    PHQ-9 Score 13  11        Data saved with a previous flowsheet row definition      01/16/2024    2:39 PM 10/16/2023   10:33 AM 07/05/2023    9:13 AM 04/03/2023    8:30 AM  GAD 7 : Generalized Anxiety Score  Nervous, Anxious, on Edge 2 1 2 3   Control/stop worrying 0 1 1 1   Worry too much - different things 0 2 1 2   Trouble relaxing 3 2 2 1   Restless 3 2 3 3   Easily annoyed or irritable 3 3 3  1  Afraid - awful might happen  0 3 2  Total GAD 7 Score  11 15 13   Anxiety Difficulty   Somewhat difficult Somewhat difficult   Relevant past medical, surgical, family and social history reviewed and updated as indicated. Interim medical history since our last visit reviewed. Allergies and medications reviewed and updated.  Review of Systems  Constitutional:  Negative for activity change, appetite change, fatigue and fever.  Respiratory:  Negative for cough, chest tightness and shortness of breath.   Cardiovascular:  Negative for chest pain, palpitations and leg swelling.  Gastrointestinal:  Positive for abdominal pain (after vomiting to LUQ), constipation, nausea and vomiting. Negative for abdominal distention, blood in stool, diarrhea and rectal pain.  Endocrine: Negative for cold intolerance, heat intolerance, polydipsia, polyphagia and polyuria.  Genitourinary: Negative.   Musculoskeletal:  Negative for myalgias.  Neurological: Negative.   Psychiatric/Behavioral:  Positive for sleep disturbance. Negative for decreased concentration, self-injury and suicidal ideas. The patient is nervous/anxious.    Per HPI unless specifically indicated above     Objective:    BP 93/67 (BP Location: Left Arm, Patient Position: Sitting, Cuff Size: Large)   Pulse 92   Temp 97.9 F (36.6 C) (Oral)    Resp 18   Ht 5' 4.02 (1.626 m)   Wt 188 lb (85.3 kg)   LMP  (LMP Unknown) Comment: LAST PERIOD IN MAY 2019 WHEN SHE STARTED CHEMO  SpO2 98%   BMI 32.25 kg/m   Wt Readings from Last 3 Encounters:  01/16/24 188 lb (85.3 kg)  01/11/24 189 lb 9.5 oz (86 kg)  01/11/24 190 lb 3.2 oz (86.3 kg)    Physical Exam Vitals and nursing note reviewed.  Constitutional:      General: She is awake. She is not in acute distress.    Appearance: Normal appearance. She is well-developed and well-groomed. She is obese. She is not ill-appearing or toxic-appearing.  HENT:     Head: Normocephalic.     Right Ear: Hearing and external ear normal.     Left Ear: Hearing and external ear normal.  Eyes:     General: Lids are normal.        Right eye: No discharge.        Left eye: No discharge.     Conjunctiva/sclera: Conjunctivae normal.     Pupils: Pupils are equal, round, and reactive to light.  Neck:     Thyroid : No thyromegaly.     Vascular: No carotid bruit.  Cardiovascular:     Rate and Rhythm: Normal rate and regular rhythm.     Heart sounds: Normal heart sounds. No murmur heard.    No gallop.  Pulmonary:     Effort: Pulmonary effort is normal. No accessory muscle usage or respiratory distress.     Breath sounds: Normal breath sounds.  Abdominal:     General: Bowel sounds are normal. There is no distension.     Palpations: Abdomen is soft.     Tenderness: There is generalized abdominal tenderness. There is no right CVA tenderness or left CVA tenderness.  Musculoskeletal:     Cervical back: Normal range of motion and neck supple.     Right lower leg: No edema.     Left lower leg: No edema.  Lymphadenopathy:     Cervical: No cervical adenopathy.  Skin:    General: Skin is warm and dry.  Neurological:     Mental Status: She is alert and oriented to person, place,  and time.     Deep Tendon Reflexes: Reflexes are normal and symmetric.     Reflex Scores:      Brachioradialis reflexes are 2+  on the right side and 2+ on the left side.      Patellar reflexes are 2+ on the right side and 2+ on the left side. Psychiatric:        Attention and Perception: Attention normal.        Mood and Affect: Mood normal.        Speech: Speech normal.        Behavior: Behavior normal. Behavior is cooperative.        Thought Content: Thought content normal.    Results for orders placed or performed in visit on 01/16/24  Bayer DCA Hb A1c Waived   Collection Time: 01/16/24  3:03 PM  Result Value Ref Range   HB A1C (BAYER DCA - WAIVED) 8.0 (H) 4.8 - 5.6 %      Assessment & Plan:   Problem List Items Addressed This Visit       Cardiovascular and Mediastinum   Hypotension   Ongoing, stable. Recommend to continue to take plenty of water intake daily and add some salt to diet + wear compression on during day and off at night. Has seen cardiology with overall stable work-up. Would avoid HTN medications.        Digestive   Nausea and vomiting   Over past few weeks. Will stop Rybelsus  as suspect some is related to this, has not felt well since started and did not tolerate Trulicity  in past. Recommend she continue Dulcolax for now and take daily, may need Lubiprostone in future for chronic constipation -- takes opioid daily. Phenergan  sent in for nausea since Zofran  offering no benefit at present. Labs today: CBC, CMP, Amylase, Lipase, GGT, H.Pylori stool. Determine next steps after all labs returned. Strict ER precautions given.      Relevant Orders   Gamma GT   Amylase   Lipase   H. pylori antigen, stool   CBC with Differential/Platelet     Endocrine   Type 2 diabetes mellitus with hyperglycemia, with long-term current use of insulin  (HCC) - Primary   Chronic, ongoing.  A1c remains 8% today.  Feeding whole family and healthier items are costly.  Getting assistance with Farxiga . Appreciate Channing PharmD with [facility] for her assistance, recent recommendations reviewed and discussed with  patient. Since starting Rybelsus  has not been feeling 100%, will stop this due to recent worsening N&V with constipation. Did not tolerate Trulicity  in past. -  Start Humalog  5-6 units at supper time, biggest meal, to start. Discussed with her today. If blood sugars remain consistently over 180 two hours after dinner over the next 3 to 5 days then increase Humalog  by 1 to 2 units. Do not take if do not eat. Educated her at length today on this. -  Continue Lantus  60 units and back down to 55 units if too many lows <70 with this.  -  Will continue Metformin  at current dosing. Continue Farxiga . -  Recommend monitor BS at home TID and heavily focus on diet changes.  -  Urine ALB 150 February 2025, cannot take ACE/ARB due to hypotension.  Statin on board.   -  Eye exam and foot exam up to date. -  Return in 3 months.  Will have return to endo if note ongoing poor control, but suspect with current team approach with PharmD and PCP +  patient will be able to get A1c to goal.      Relevant Medications   insulin  lispro (HUMALOG  KWIKPEN) 100 UNIT/ML KwikPen   Other Relevant Orders   Bayer DCA Hb A1c Waived (Completed)   Hyperlipidemia associated with type 2 diabetes mellitus (HCC)   Chronic, ongoing.  Continue Rosuvastatin  40 MG daily.  Lipid panel today and adjust dose as needed.      Relevant Medications   insulin  lispro (HUMALOG  KWIKPEN) 100 UNIT/ML KwikPen   Other Relevant Orders   Bayer DCA Hb A1c Waived (Completed)   Comprehensive metabolic panel with GFR   Lipid Panel w/o Chol/HDL Ratio     Nervous and Auditory   Nicotine dependence, cigarettes, w unsp disorders   Quit in November 2024 with Wellbutrin . Continue Wellbutrin . She has also quit vaping now. Recommend she schedule lung cancer screening in future.        Other   Uncomplicated opioid dependence (HCC) (Chronic)   Chronic, stable.  Followed by pain management, continue this collaboration, recent notes reviewed.      Severe  depression (HCC)   Chronic, ongoing. Denies SI/HI. Is following with Dr. Chipper again, appreciate her input. Continue current medication regimen and adjust as needed.       Obesity   BMI 32.25, with T2DM, HTN, HLD  Recommended eating smaller high protein, low fat meals more frequently and exercising 30 mins a day 5 times a week with a goal of 10-15lb weight loss in the next 3 months. Patient voiced their understanding and motivation to adhere to these recommendations.       Relevant Medications   insulin  lispro (HUMALOG  KWIKPEN) 100 UNIT/ML KwikPen   Malignant neoplasm of lower-outer quadrant of left breast of female, estrogen receptor positive (HCC)   Ongoing, stable.  Continue collaboration with oncology, recent note reviewed. Port remains in place, but no treatments.      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  Will continue Gabapentin , other medications caused too much fatigue.  Benefit to sleep and to migraines and chronic pain.          Follow up plan: Return in about 1 week (around 01/23/2024) for Nausea and vomiting.

## 2024-01-16 NOTE — Assessment & Plan Note (Signed)
 Quit in November 2024 with Wellbutrin . Continue Wellbutrin . She has also quit vaping now. Recommend she schedule lung cancer screening in future.

## 2024-01-16 NOTE — Assessment & Plan Note (Signed)
 BMI 32.25, with T2DM, HTN, HLD  Recommended eating smaller high protein, low fat meals more frequently and exercising 30 mins a day 5 times a week with a goal of 10-15lb weight loss in the next 3 months. Patient voiced their understanding and motivation to adhere to these recommendations.

## 2024-01-17 ENCOUNTER — Ambulatory Visit: Payer: Self-pay | Admitting: Nurse Practitioner

## 2024-01-17 LAB — COMPREHENSIVE METABOLIC PANEL WITH GFR
ALT: 16 IU/L (ref 0–32)
AST: 15 IU/L (ref 0–40)
Albumin: 4.4 g/dL (ref 3.8–4.9)
Alkaline Phosphatase: 87 IU/L (ref 49–135)
BUN/Creatinine Ratio: 17 (ref 9–23)
BUN: 19 mg/dL (ref 6–24)
Bilirubin Total: 0.7 mg/dL (ref 0.0–1.2)
CO2: 26 mmol/L (ref 20–29)
Calcium: 10.4 mg/dL — ABNORMAL HIGH (ref 8.7–10.2)
Chloride: 96 mmol/L (ref 96–106)
Creatinine, Ser: 1.12 mg/dL — ABNORMAL HIGH (ref 0.57–1.00)
Globulin, Total: 2.4 g/dL (ref 1.5–4.5)
Glucose: 328 mg/dL — ABNORMAL HIGH (ref 70–99)
Potassium: 4.5 mmol/L (ref 3.5–5.2)
Sodium: 137 mmol/L (ref 134–144)
Total Protein: 6.8 g/dL (ref 6.0–8.5)
eGFR: 58 mL/min/1.73 — ABNORMAL LOW (ref >59)

## 2024-01-17 LAB — LIPID PANEL W/O CHOL/HDL RATIO
Cholesterol, Total: 138 mg/dL (ref 100–199)
HDL: 49 mg/dL (ref >39)
LDL Chol Calc (NIH): 65 mg/dL (ref 0–99)
Triglycerides: 138 mg/dL (ref 0–149)
VLDL Cholesterol Cal: 24 mg/dL (ref 5–40)

## 2024-01-17 LAB — CBC WITH DIFFERENTIAL/PLATELET
Basophils Absolute: 0 x10E3/uL (ref 0.0–0.2)
Basos: 0 %
EOS (ABSOLUTE): 0.1 x10E3/uL (ref 0.0–0.4)
Eos: 1 %
Hematocrit: 43.3 % (ref 34.0–46.6)
Hemoglobin: 13.9 g/dL (ref 11.1–15.9)
Immature Grans (Abs): 0 x10E3/uL (ref 0.0–0.1)
Immature Granulocytes: 0 %
Lymphocytes Absolute: 1.6 x10E3/uL (ref 0.7–3.1)
Lymphs: 20 %
MCH: 31 pg (ref 26.6–33.0)
MCHC: 32.1 g/dL (ref 31.5–35.7)
MCV: 96 fL (ref 79–97)
Monocytes Absolute: 0.4 x10E3/uL (ref 0.1–0.9)
Monocytes: 5 %
Neutrophils Absolute: 5.5 x10E3/uL (ref 1.4–7.0)
Neutrophils: 74 %
Platelets: 244 x10E3/uL (ref 150–450)
RBC: 4.49 x10E6/uL (ref 3.77–5.28)
RDW: 13.9 % (ref 11.7–15.4)
WBC: 7.6 x10E3/uL (ref 3.4–10.8)

## 2024-01-17 LAB — AMYLASE: Amylase: 55 U/L (ref 31–110)

## 2024-01-17 LAB — LIPASE: Lipase: 26 U/L (ref 14–72)

## 2024-01-17 LAB — GAMMA GT: GGT: 12 IU/L (ref 0–60)

## 2024-01-17 NOTE — Progress Notes (Signed)
 Contacted via MyChart  Good afternoon Alfie, your labs have returned: - Kidney function, creatinine and eGFR, continues to show some Stage 3a kidney disease with no worsening. Liver function, AST and ALT, is normal. Electrolytes are overall stable, which is good news. - Remainder of labs normal. As we discussed stop Rybelsus  and we will see if symptoms improve. Any questions? Keep being amazing!!  Thank you for allowing me to participate in your care.  I appreciate you. Kindest regards, Aitan Rossbach

## 2024-01-20 LAB — H. PYLORI ANTIGEN, STOOL: H pylori Ag, Stl: NEGATIVE

## 2024-01-20 NOTE — Progress Notes (Signed)
 Contacted via MyChart  Stool sample negative, great news. Suspect the symptoms were more related to Rybelsus  as we discussed, continue without it.

## 2024-01-21 NOTE — Patient Instructions (Incomplete)
 Vomiting, Adult Vomiting is when stomach contents forcefully come out of the mouth. Many people notice nausea before vomiting. Vomiting can make you feel weak and cause you to become dehydrated. Dehydration can make you feel tired and thirsty, cause you to have a dry mouth, and decrease how often you urinate. Older adults and people who have other diseases or a weak body defense system (immune system) are at higher risk for dehydration. It is important to treat vomiting as told by your health care provider. Follow these instructions at home:  Watch your symptoms for any changes. Tell your health care provider about them. Eating and drinking     Follow these recommendations as told by your health care provider: Take an oral rehydration solution (ORS). This is a drink that is sold at pharmacies and retail stores. Eat bland, easy-to-digest foods in small amounts as you are able. These foods include bananas, applesauce, rice, lean meats, toast, and crackers. Drink clear fluids slowly and in small amounts as you are able. Clear fluids include water, ice chips, low-calorie sports drinks, and fruit juice that has water added (diluted fruit juice). Avoid drinking fluids that contain a lot of sugar or caffeine, such as energy drinks, sports drinks, and soda. Avoid alcohol. Avoid spicy or fatty foods.  General instructions Wash your hands often using soap and water for at least 20 seconds. If soap and water are not available, use hand sanitizer. Make sure that everyone in your household washes their hands frequently. Take over-the-counter and prescription medicines only as told by your health care provider. Rest at home while you recover. Watch your condition for any changes. Keep all follow-up visits. This is important. Contact a health care provider if: Your vomiting gets worse. You have new symptoms. You have a fever. You cannot drink fluids without vomiting. You feel light-headed or  dizzy. You have a headache. You have muscle cramps. You have a rash. You have pain while urinating. Get help right away if: You have pain in your chest, neck, arm, or jaw. Your heart is beating very quickly. You have trouble breathing or you are breathing very quickly. You feel extremely weak or you faint. Your skin feels cold and clammy. You feel confused. You have persistent vomiting. You have vomit that is bright red or looks like black coffee grounds. You have stools (feces) that are bloody or black, or stools that look like tar. You have a severe headache, a stiff neck, or both. You have severe pain, cramping, or bloating in your abdomen. You have signs of dehydration, such as: Dark urine, very little urine, or no urine. Cracked lips. Dry mouth. Sunken eyes. Sleepiness. Weakness. These symptoms may be an emergency. Get help right away. Call 911. Do not wait to see if the symptoms will go away. Do not drive yourself to the hospital. Summary Vomiting is when stomach contents forcefully come out of the mouth. Vomiting can cause you to become dehydrated. It is important to treat vomiting as told by your health care provider. Follow your health care provider's instructions about eating and drinking. Wash your hands often using soap and water for at least 20 seconds. If soap and water are not available, use hand sanitizer. Watch your condition for any changes and for signs of dehydration. Keep all follow-up visits. This is important. This information is not intended to replace advice given to you by your health care provider. Make sure you discuss any questions you have with your health care provider.  Document Revised: 07/31/2020 Document Reviewed: 07/31/2020 [employer]vier Patient Education  2024 [employer].

## 2024-01-26 ENCOUNTER — Ambulatory Visit: Admitting: Nurse Practitioner

## 2024-02-07 ENCOUNTER — Other Ambulatory Visit: Payer: Self-pay | Admitting: Nurse Practitioner

## 2024-02-10 NOTE — Progress Notes (Unsigned)
 PROVIDER NOTE: Interpretation of information contained herein should be left to medically-trained personnel. Specific patient instructions are provided elsewhere under Patient Instructions section of medical record. This document was created in part using AI and STT-dictation technology, any transcriptional errors that may result from this process are unintentional.  Patient: Carolyn Roy  Service: E/M   PCP: Valerio Melanie DASEN, NP  DOB: Jun 16, 1965  DOS: 02/13/2024  Provider: Emmy MARLA Blanch, NP  MRN: [id number]  Delivery: Face-to-face  Specialty: Interventional Pain Management  Type: Established Patient  Setting: Ambulatory outpatient facility  Specialty designation: 09  Referring Prov.: Valerio Melanie DASEN, NP  Location: Outpatient office facility       History of present illness (HPI) [name], a 59 y.o. year old female, is here today because of her No primary diagnosis found.. Carolyn Roy primary complain today is No chief complaint on file.  Pertinent problems: Carolyn Roy has Chemotherapy-induced neuropathy; Malignant neoplasm of lower-outer quadrant of left breast of female, estrogen receptor positive (HCC); Chronic feet pain (1ry area of Pain) (Bilateral) (R>L); Neuropathic pain of feet (Bilateral); Chronic knee pain (2ry area of Pain) (Bilateral) (R>L); Chronic hand pain (3ry area of Pain) (Bilateral) (R>L); Chronic pain syndrome; Pharmacologic therapy; Disorder of skeletal system; Chronic hip pain (4th area of Pain) (Bilateral) (R>L); Type 2 diabetes mellitus with hyperglycemia, with long-term current use of insulin  (HCC); Chronic upper extremity pain (3ry area of Pain) (Bilateral) (R>L); Cancer-related pain; Osteoarthritis of knee (Right); Chronic low back pain (Bilateral (L>R) w/o sciatica; Chronic hip pain (Left); Lumbar facet syndrome (Bilateral) (L>R); Idiopathic scoliosis; Chronic sacroiliac joint pain (Left); Chronic pain of knee (Right); Lumbar facet arthropathy; Spondylosis  without myelopathy or radiculopathy, lumbosacral region; DDD (degenerative disc disease), lumbosacral; Numbness and tingling of upper extremity (C6/C7 dermatomes) (Right); Cervicalgia; Cervical radiculitis (Right); Uncomplicated opioid dependence (HCC); Chronic use of opiate for therapeutic purpose; Tricompartment osteoarthritis of knee (Right); 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  Pain Assessment: Severity of   is reported as a  /10. Location:    / . Onset:  . Quality:  . Timing:  . Modifying factor(s):  SABRA Vitals:  vitals were not taken for this visit.  BMI: Estimated body mass index is 32.25 kg/m as calculated from the following:   Height as of 01/16/24: 5' 4.02 (1.626 m).   Weight as of 01/16/24: 188 lb (85.3 kg).  Last encounter: 11/20/2023. Last procedure: Visit date not found.  Reason for encounter: {Blank single:19197::medication management,post-procedure evaluation and assessment,both, medication management and post-procedure evaluation and assessment,evaluation of worsening, or previously known (established) problem,patient-requested evaluation,follow-up evaluation,evaluation for possible interventional PM therapy/treatment,evaluation of new problem, *** }.   Discussed the use of AI scribe software for clinical note transcription with the patient, who gave verbal consent to proceed.  History of Present Illness           Pharmacotherapy Assessment   Oxycodone  (Oxy IR/Roxiecodone) 5 mg immediate release tablet 2 times daily as needed for pain. MME=15 Monitoring: [place] PMP: PDMP reviewed during this encounter.       Pharmacotherapy: No side-effects or adverse reactions reported. Compliance: No problems identified. Effectiveness: Clinically acceptable.  Erlene Doyal SAUN[place], [place]  11/20/2023 11:47 AM  Sign when Signing Visit Nursing  Pain Medication Assessment:  Safety precautions to be maintained throughout the outpatient stay will include: orient to surroundings, keep bed in low  position, maintain call bell within reach at all times, provide assistance with transfer out of bed and ambulation.  Medication Inspection Compliance: Pill count conducted under aseptic conditions, in front of the patient. Neither the pills nor the bottle was removed from the patient's sight at any time. Once count was completed pills were immediately returned to the patient in their original bottle.  Medication: Oxycodone  IR Pill/Patch Count: 48 of 60 pills/patches remain Pill/Patch Appearance: Markings consistent with prescribed medication Bottle Appearance: Standard pharmacy container. Clearly labeled. Filled Date: 18 / 06 / 2025 Last Medication intake:  Yesterday    UDS:  Summary  Date Value Ref Range Status  08/21/2023 FINAL  Final    Comment:    ==================================================================== [employer] (MW) ==================================================================== Test                             Result       Flag       Units  Drug Present and Declared for Prescription Verification   Oxycodone                       2002         EXPECTED   ng/mg creat   Oxymorphone                    158          EXPECTED   ng/mg creat   Noroxycodone                   2666         EXPECTED   ng/mg creat    Sources of oxycodone  include scheduled prescription medications.    Oxymorphone and noroxycodone are expected metabolites of oxycodone .    Oxymorphone is also available as a scheduled prescription medication.  ==================================================================== Test                      Result    Flag   Units      Ref Range   Creatinine              83               mg/dL      >=79 ==================================================================== Declared Medications:  The flagging  and interpretation on this report are based on the  following declared medications.  Unexpected results may arise from  inaccuracies in the declared medications.   **Note: The testing scope of this panel includes these medications:   Oxycodone  (Roxicodone )   **Note: The testing scope of this panel does not include the  following reported medications:   Acetaminophen  (Excedrin)  Aspirin  (Excedrin)  Bupropion  (Wellbutrin  XL)  Buspirone  (Buspar )  Caffeine (Excedrin)  Dapagliflozin  (Farxiga )  Duloxetine  (Cymbalta )  Famotidine  (Pepcid )  Gabapentin  (Neurontin )  Insulin  (Lantus )  Letrozole  (Femara )  Meloxicam  (Mobic )  Metformin  (Glucophage )  Naloxone  (Narcan )  Ondansetron  (Zofran )  Rosuvastatin  (Crestor )  Sitagliptin  (Januvia )  Sumatriptan  (Imitrex ) ==================================================================== For clinical consultation, please call [phone]. ====================================================================     No results found for: CBDTHCR No results found for: D8THCCBX No results found for: D9THCCBX  ROS  Constitutional: Denies any fever or chills Gastrointestinal: No reported hemesis, hematochezia, vomiting, or acute GI distress Musculoskeletal: foot pain, hand pain  Neurological: No reported episodes of acute onset apraxia, aphasia, dysarthria, agnosia, amnesia, paralysis, loss of coordination, or loss of consciousness  Medication Review  DULoxetine , FreeStyle Libre 3 Plus Sensor, Insulin  Pen Needle, OneTouch Verio, SUMAtriptan , Semaglutide , aspirin -acetaminophen -caffeine, buPROPion , busPIRone , dapagliflozin  propanediol, famotidine , gabapentin , glucose blood, insulin  glargine, letrozole , meloxicam , metFORMIN , ondansetron , oxyCODONE , and rosuvastatin   History Review  Allergy: Carolyn Roy is allergic to aspirin . Drug: Carolyn Roy  reports no history of drug use. Alcohol:  reports no history of alcohol use. Tobacco:  reports that she  has quit smoking. Her smoking use included cigarettes. She started smoking about 11 years ago. She has a 11 pack-year smoking history. She has quit using smokeless tobacco.  Her smokeless tobacco use included snuff. Social: Carolyn Roy  reports that she has quit smoking. Her smoking use included cigarettes. She started smoking about 11 years ago. She has a 11 pack-year smoking history. She has quit using smokeless tobacco.  Her smokeless tobacco use included snuff. She reports that she does not drink alcohol and does not use drugs. Medical:  has a past medical history of Allergy, Breast cancer (HCC), Cancer (HCC) (06/15/2017), Depression, Diabetes mellitus without complication (HCC) (2017), Edema of left upper extremity, Endometriosis, Family history of breast cancer, Headache, Hyperlipidemia, Lymphedema of left arm, Ovarian mass, Personal history of chemotherapy, Personal history of radiation therapy, and Pneumonia. Surgical: Carolyn Roy  has a past surgical history that includes Oophorectomy; Tubal ligation; Portacath placement (Right, 07/14/2017); Axillary lymph node biopsy (Left, 07/14/2017); Breast lumpectomy (Left, 01/12/2018); Breast biopsy (Left); Partial mastectomy with needle localization (Left, 01/12/2018); Sentinel node biopsy (Left, 01/12/2018); Colonoscopy with propofol  (N/A, 12/27/2019); and Colonoscopy with propofol  (N/A, 01/05/2022). Family: family history includes Breast cancer in an other family member; Breast cancer (age of onset: 78) in her maternal grandmother; Breast cancer (age of onset: 39) in an other family member; Breast cancer (age of onset: 78) in her maternal aunt; Cancer in her mother; Colon cancer in her maternal grandmother and mother; Diabetes in her brother; Other in her father; Pancreatitis in her brother; Prostate cancer (age of onset: 20) in her brother.  Laboratory Chemistry Profile   Renal Lab Results  Component Value Date   BUN 11 10/16/2023   CREATININE 1.05 (H)  10/16/2023   BCR 10 10/16/2023   GFRAA >60 10/21/2019   GFRNONAA >60 08/29/2023    Hepatic Lab Results  Component Value Date   AST 16 10/16/2023   ALT 18 10/16/2023   ALBUMIN 4.0 10/16/2023   ALKPHOS 108 10/16/2023   LIPASE 31 12/01/2020    Electrolytes Lab Results  Component Value Date   NA 139 10/16/2023   K 4.3 10/16/2023   CL 102 10/16/2023   CALCIUM  9.4 10/16/2023   MG 2.3 03/07/2022    Bone Lab Results  Component Value Date   VD25OH 99.50 11/29/2022    Inflammation (CRP: Acute Phase) (ESR: Chronic Phase) Lab Results  Component Value Date   CRP <1 12/21/2020   ESRSEDRATE 3 12/21/2020         Note: Above Lab results reviewed.  Recent Imaging Review  DG Bone Density EXAM: DUAL X-RAY ABSORPTIOMETRY (DXA) FOR BONE MINERAL DENSITY 07/18/2023 10:05 am  CLINICAL DATA:  59 year old Female Postmenopausal. Screening for osteoporosis  History of fragility fracture. Patient is or has been on bone building therapies.  TECHNIQUE: An axial (e.g., hips, spine) and/or appendicular (e.g., radius) exam was performed, as appropriate, using GE Secretary/administrator at [facility]. Images are obtained for bone mineral density measurement and are not obtained for diagnostic purposes. [id number]  Exclusions: L3-L4 due to degenerative changes  COMPARISON:  None.  New baseline.  FINDINGS: Scan quality: Good.  LUMBAR SPINE (L1-L2):  BMD (in g/cm2): 1.116  T-score: -0.5  Z-score: 0.4  LEFT FEMORAL NECK:  BMD (in g/cm2): 1.083  T-score: 0.3  Z-score: 1.4  LEFT TOTAL HIP:  BMD (in g/cm2): 1.122  T-score: 0.9  Z-score: 1.6  RIGHT FEMORAL NECK:  BMD (in g/cm2): 1.056  T-score: 0.1  Z-score: 1.2  RIGHT TOTAL HIP:  BMD (in g/cm2): 1.082  T-score: 0.6  Z-score: 1.3  FRAX 10-YEAR PROBABILITY OF FRACTURE:  FRAX not reported as the lowest BMD is not in the osteopenia range.  IMPRESSION: Normal based on BMD.  Fracture risk  is unknown due to history of bone building therapy.  RECOMMENDATIONS: 1. All patients should optimize calcium  and vitamin D  intake.  2. Consider FDA-approved medical therapies in postmenopausal women and men aged 80 years and older, based on the following:  - A hip or vertebral (clinical or morphometric) fracture  - T-score less than or equal to -2.5 and secondary causes have been excluded.  - Low bone mass (T-score between -1.0 and -2.5) and a 10-year probability of a hip fracture greater than or equal to 3% or a 10-year probability of a major osteoporosis-related fracture greater than or equal to 20% based on the [place] -adapted WHO algorithm.  - Clinician judgment and/or patient preferences may indicate treatment for people with 10-year fracture probabilities above or below these levels  3. Patients with diagnosis of osteoporosis or at high risk for fracture should have regular bone mineral density tests. For patients eligible for Medicare, routine testing is allowed once every 2 years. The testing frequency can be increased to one year for patients who have rapidly progressing disease, those who are receiving or discontinuing medical therapy to restore bone mass, or have additional risk factors.  Electronically Signed   By: Reyes Phi M.D.   On: 07/18/2023 10:51 Note: Reviewed        Physical Exam  Vitals: BP 99/76 (BP Location: Right Arm, Patient Position: Sitting, Cuff Size: Normal)   Pulse 98   Temp (!) 97.2 F (36.2 C) (Temporal)   Resp 18   Ht 5' 4 (1.626 m)   Wt 190 lb (86.2 kg)   LMP  (LMP Unknown) Comment: LAST PERIOD IN MAY 2019 WHEN SHE STARTED CHEMO  SpO2 98%   BMI 32.61 kg/m  BMI: Estimated body mass index is 32.61 kg/m as calculated from the following:   Height as of this encounter: 5' 4 (1.626 m).   Weight as of this encounter: 190 lb (86.2 kg). Ideal: Ideal body weight: 54.7 kg (120 lb 9.5 oz) Adjusted ideal body weight: 67.3 kg (148 lb 5.7  oz) General appearance: Well nourished, well developed, and well hydrated. In no apparent acute distress Mental status: Alert, oriented x 3 (person, place, & time)       Respiratory: No evidence of acute respiratory distress Eyes: PERLA  Musculoskeletal: Foot pain, bilateral hand pain Assessment   Diagnosis Status  1. Chronic feet pain (1ry area of Pain) (Bilateral) (R>L)   2. Chronic knee pain (2ry area of Pain) (Bilateral) (R>L)   3. Chronic pain syndrome   4. Chronic use of opiate for therapeutic purpose   5. Chronic hand pain (3ry area of Pain) (Bilateral) (R>L)   6. Cervicalgia   7. Cancer-related pain   8. Chronic hip pain (4th area of Pain) (Bilateral) (R>L)   9. Grade 1 Anterolisthesis of lumbar spine (L3/L4 and L4/L5)  Controlled Controlled Controlled   Updated Problems: No problems updated.  Plan of Care  Problem-specific:  Assessment and Plan    Monitoring: [place] PMP: PDMP not reviewed this encounter. {Blank single:19197::Unable to conduct review of the controlled substance reporting system due to technological failure.,     } Pharmacotherapy: {Blank single:19197::Opioid-induced constipation (OIC)(K59.03, T40.2X5A),No side-effects or adverse reactions reported.} Compliance: {Blank single:19197::Medication agreement violation - unsanctioned acquisition/use of additional opioid-containing medication,No problems identified.} Effectiveness: {Blank single:19197::Clinically acceptable.}  No notes on file  UDS:  Summary  Date Value Ref Range Status  08/21/2023 FINAL  Final    Comment:    ==================================================================== [employer] [facility] [employer] (MW) ==================================================================== Test                             Result       Flag       Units  Drug Present and Declared for Prescription Verification   Oxycodone                       2002         EXPECTED   ng/mg creat   Oxymorphone                     158          EXPECTED   ng/mg creat   Noroxycodone                   2666         EXPECTED   ng/mg creat    Sources of oxycodone  include scheduled prescription medications.    Oxymorphone and noroxycodone are expected metabolites of oxycodone .    Oxymorphone is also available as a scheduled prescription medication.  ==================================================================== Test                      Result    Flag   Units      Ref Range   Creatinine              83               mg/dL      >=79 ==================================================================== Declared Medications:  The flagging and interpretation on this report are based on the  following declared medications.  Unexpected results may arise from  inaccuracies in the declared medications.   **Note: The testing scope of this panel includes these medications:   Oxycodone  (Roxicodone )   **Note: The testing scope of this panel does not include the  following reported medications:   Acetaminophen  (Excedrin)  Aspirin  (Excedrin)  Bupropion  (Wellbutrin  XL)  Buspirone  (Buspar )  Caffeine (Excedrin)  Dapagliflozin  (Farxiga )  Duloxetine  (Cymbalta )  Famotidine  (Pepcid )  Gabapentin  (Neurontin )  Insulin  (Lantus )  Letrozole  (Femara )  Meloxicam  (Mobic )  Metformin  (Glucophage )  Naloxone  (Narcan )  Ondansetron  (Zofran )  Rosuvastatin  (Crestor )  Sitagliptin  (Januvia )  Sumatriptan  (Imitrex ) ==================================================================== For clinical consultation, please call [phone]. ====================================================================     No results found for: CBDTHCR No results found for: D8THCCBX No results found for: D9THCCBX  ROS  Constitutional: {Blank single:19197::Denies any fever or chills} Gastrointestinal: {Blank single:19197::No reported hemesis, hematochezia, vomiting, or acute GI distress} Musculoskeletal: {Blank  single:19197::Denies any acute onset joint swelling, redness, loss of ROM, or weakness} Neurological: {Blank single:19197::No reported episodes of acute onset apraxia, aphasia, dysarthria, agnosia, amnesia, paralysis, loss of coordination, or loss of consciousness}  Medication Review  DULoxetine , FreeStyle Libre 3 Plus  Sensor, Insulin  Pen Needle, OneTouch Verio, SUMAtriptan , aspirin -acetaminophen -caffeine, bisacodyl, buPROPion , busPIRone , dapagliflozin  propanediol, famotidine , gabapentin , glucose blood, insulin  glargine, insulin  lispro, letrozole , meloxicam , metFORMIN , ondansetron , oxyCODONE , polyethylene glycol, promethazine , and rosuvastatin   History Review  Allergy: Carolyn Roy is allergic to aspirin . Drug: Carolyn Roy  reports no history of drug use. Alcohol:  reports no history of alcohol use. Tobacco:  reports that she has quit smoking. Her smoking use included cigarettes. She started smoking about 12 years ago. She has a 11 pack-year smoking history. She has quit using smokeless tobacco.  Her smokeless tobacco use included snuff. Social: Carolyn Roy  reports that she has quit smoking. Her smoking use included cigarettes. She started smoking about 12 years ago. She has a 11 pack-year smoking history. She has quit using smokeless tobacco.  Her smokeless tobacco use included snuff. She reports that she does not drink alcohol and does not use drugs. Medical:  has a past medical history of Allergy, Breast cancer (HCC), Cancer (HCC) (06/15/2017), Depression, Diabetes mellitus without complication (HCC) (2017), Edema of left upper extremity, Endometriosis, Family history of breast cancer, Headache, Hyperlipidemia, Lymphedema of left arm, Ovarian mass, Personal history of chemotherapy, Personal history of radiation therapy, and Pneumonia. Surgical: Carolyn Roy  has a past surgical history that includes Oophorectomy; Tubal ligation; Portacath placement (Right, 07/14/2017); Axillary lymph node biopsy (Left,  07/14/2017); Breast lumpectomy (Left, 01/12/2018); Breast biopsy (Left); Partial mastectomy with needle localization (Left, 01/12/2018); Sentinel node biopsy (Left, 01/12/2018); Colonoscopy with propofol  (N/A, 12/27/2019); and Colonoscopy with propofol  (N/A, 01/05/2022). Family: family history includes Breast cancer in an other family member; Breast cancer (age of onset: 71) in her maternal grandmother; Breast cancer (age of onset: 74) in an other family member; Breast cancer (age of onset: 75) in her maternal aunt; Cancer in her mother; Colon cancer in her maternal grandmother and mother; Diabetes in her brother; Other in her father; Pancreatitis in her brother; Prostate cancer (age of onset: 26) in her brother.  Laboratory Chemistry Profile   Renal Lab Results  Component Value Date   BUN 19 01/16/2024   CREATININE 1.12 (H) 01/16/2024   BCR 17 01/16/2024   GFRAA >60 10/21/2019   GFRNONAA 52 (L) 01/11/2024    Hepatic Lab Results  Component Value Date   AST 15 01/16/2024   ALT 16 01/16/2024   ALBUMIN 4.4 01/16/2024   ALKPHOS 87 01/16/2024   AMYLASE 55 01/16/2024   LIPASE 26 01/16/2024    Electrolytes Lab Results  Component Value Date   NA 137 01/16/2024   K 4.5 01/16/2024   CL 96 01/16/2024   CALCIUM  10.4 (H) 01/16/2024   MG 2.3 03/07/2022    Bone Lab Results  Component Value Date   VD25OH 99.50 11/29/2022    Inflammation (CRP: Acute Phase) (ESR: Chronic Phase) Lab Results  Component Value Date   CRP <1 12/21/2020   ESRSEDRATE 3 12/21/2020         Note: {Blank single:19197::Carolyn Roy indicates labs done and monitored by primary care practitioner using a non-[facility] EMR system,No results found under the [facility] electronic medical record,Patient noncompliant with Lab Work orders.,Results made available to patient.,Lab results reviewed and made available to patient.,Lab results reviewed and explained to patient in Layman's terms.,Above Lab results  reviewed.}  Recent Imaging Review  CT ABDOMEN PELVIS W CONTRAST CLINICAL DATA:  worsening epigastric pain, nausea and vomiting  EXAM: CT ABDOMEN AND PELVIS WITH CONTRAST  TECHNIQUE: Multidetector CT imaging of the abdomen and pelvis was performed using the standard  protocol following bolus administration of intravenous contrast.  RADIATION DOSE REDUCTION: This exam was performed according to the departmental dose-optimization program which includes automated exposure control, adjustment of the mA and/or kV according to patient size and/or use of iterative reconstruction technique.  CONTRAST:  OMNIPAQUE  IOHEXOL  300 MG/ML  SOLN  COMPARISON:  Jun 29, 2017  FINDINGS: Lower chest: No focal airspace consolidation or pleural effusion.Partially visualized central venous catheter in the right atrium.  Hepatobiliary: No mass.No radiopaque stones or wall thickening of the gallbladder. No intrahepatic or extrahepatic biliary ductal dilation. The portal veins are patent.  Pancreas: No mass or main ductal dilation. No peripancreatic inflammation or fluid collection.  Spleen: Normal size. No mass.  Adrenals/Urinary Tract: No adrenal masses. Small bilateral peripelvic renal cysts. No nephrolithiasis or hydronephrosis. The urinary bladder is completely decompressed.  Stomach/Bowel: The stomach contains ingested material without focal abnormality. No small bowel wall thickening or inflammation. No small bowel obstruction.Normal appendix. Moderate volume fecal loading in the colon.  Vascular/Lymphatic: No aortic aneurysm. No intraabdominal or pelvic lymphadenopathy.  Reproductive: Age-related atrophy of the uterus and ovaries. No concerning adnexal mass.No free pelvic fluid.  Other: No pneumoperitoneum, ascites, or mesenteric inflammation.  Musculoskeletal: No acute fracture or destructive lesion. Multilevel degenerative disc disease of the spine.  IMPRESSION: No acute  intra-abdominal or pelvic abnormality. Moderate volume fecal loading throughout the colon, as can be seen in constipation  Electronically Signed   By: Rogelia Myers M.D.   On: 01/11/2024 19:37 Note: {Blank single:19197::No new results found.,No results found under the [facility] electronic medical record.,Imaging results reviewed and explained to patient in Layman's terms.,Results of ordered imaging test(s) reviewed and explained to patient in Layman's terms.,Imaging results reviewed.,Reviewed} {Blank single:19197::Results visible under [facility].,Results visible under [facility].,Results visible under [facility].,Results visible under [facility].,Results visible under Care Everywhere.,Results made available to patient.,Copy of results provided to patient.,Patient noncompliant with diagnostic imaging orders.,     }  Physical Exam  Vitals: LMP  (LMP Unknown) Comment: LAST PERIOD IN MAY 2019 WHEN SHE STARTED CHEMO BMI: Estimated body mass index is 32.25 kg/m as calculated from the following:   Height as of 01/16/24: 5' 4.02 (1.626 m).   Weight as of 01/16/24: 188 lb (85.3 kg). Ideal: Patient weight not recorded General appearance: {general exam:210120802::Well nourished, well developed, and well hydrated. In no apparent acute distress} Mental status: {Blank single:19197::Alert and oriented x 3. Exaggerated physical and/or psychosocial pain behavior perceived.,Alert, oriented x 3 (person, place, & time)} {Blank single:19197::Carolyn Roy's speech pattern and demeanor seems to suggest oversedation,     } Respiratory: {Blank single:19197::Oxygen-dependent COPD,No evidence of acute respiratory distress} Eyes: {Blank single:19197::Miotic (pupilary constriction) due to opiate use,Midriatic,Anisocoric,Evidence of ptosis,Pin-point pupils,PERLA}   Assessment   Diagnosis Status  No diagnosis found. {Blank  single:19197::Absent,Deteriorating,Having a Flare-up,Improved,Improving,Not improving,Not responding,Persistent,Present,Recurring,Reoccurring,Resolved,Responding,Stable,Unchanged,improved,Worsened,Worsening,Controlled} {Blank single:19197::Absent,Deteriorating,Having a Flare-up,Improved,Improving,Not improving,Not responding,Persistent,Present,Recurring,Reoccurring,Resolved,Responding,Stable,Unchanged,improved,Worsened,Worsening,Controlled} {Blank single:19197::Absent,Deteriorating,Having a Flare-up,Improved,Improving,Not improving,Not responding,Persistent,Present,Recurring,Reoccurring,Resolved,Responding,Stable,Unchanged,improved,Worsened,Worsening,Controlled}   Updated Problems: No problems updated.  Plan of Care  Problem-specific:  Assessment and Plan            [name] has a current medication list which includes the following long-term medication(s): bupropion , duloxetine , famotidine , gabapentin , lantus  solostar, insulin  lispro, metformin , oxycodone , oxycodone , [START ON 02/11/2024] oxycodone , promethazine , rosuvastatin , and sumatriptan .  Pharmacotherapy (Medications Ordered): No orders of the defined types were placed in this encounter.  Orders:  No orders of the defined types were placed in this encounter.    {There is no content from the last Plan section.}  No follow-ups on file.    Recent Visits Date Type Provider Dept  11/20/23 Office Visit Derry Arbogast K, NP [facility]  Showing recent visits within past 90 days and meeting all other requirements Future Appointments Date Type Provider Dept  02/13/24 Appointment Eldred Sooy K, NP [facility]  Showing future appointments within next 90 days and meeting all other requirements  I discussed the assessment and treatment plan with the patient. The patient was provided an  opportunity to ask questions and all were answered. The patient agreed with the plan and demonstrated an understanding of the instructions.  Patient advised to call back or seek an in-person evaluation if the symptoms or condition worsens.  Duration of encounter: *** minutes.  Total time on encounter, as per AMA guidelines included both the face-to-face and non-face-to-face time personally spent by the physician and/or other qualified health care professional(s) on the day of the encounter (includes time in activities that require the physician or other qualified health care professional and does not include time in activities normally performed by clinical staff). Physician's time may include the following activities when performed: Preparing to see the patient (e.g., pre-charting review of records, searching for previously ordered imaging, lab work, and nerve conduction tests) Review of prior analgesic pharmacotherapies. Reviewing PMP Interpreting ordered tests (e.g., lab work, imaging, nerve conduction tests) Performing post-procedure evaluations, including interpretation of diagnostic procedures Obtaining and/or reviewing separately obtained history Performing a medically appropriate examination and/or evaluation Counseling and educating the patient/family/caregiver Ordering medications, tests, or procedures Referring and communicating with other health care professionals (when not separately reported) Documenting clinical information in the electronic or other health record Independently interpreting results (not separately reported) and communicating results to the patient/ family/caregiver Care coordination (not separately reported)  Note by: Talajah Slimp K Thomas Rhude, NP (TTS and AI technology used. I apologize for any typographical errors that were not detected and corrected.) Date: 02/13/2024; Time: 2:58 PM

## 2024-02-13 ENCOUNTER — Ambulatory Visit: Attending: Nurse Practitioner | Admitting: Nurse Practitioner

## 2024-02-13 ENCOUNTER — Encounter: Payer: Self-pay | Admitting: Nurse Practitioner

## 2024-02-13 DIAGNOSIS — M79672 Pain in left foot: Secondary | ICD-10-CM | POA: Insufficient documentation

## 2024-02-13 DIAGNOSIS — M25561 Pain in right knee: Secondary | ICD-10-CM | POA: Insufficient documentation

## 2024-02-13 DIAGNOSIS — M79671 Pain in right foot: Secondary | ICD-10-CM | POA: Insufficient documentation

## 2024-02-13 DIAGNOSIS — G8929 Other chronic pain: Secondary | ICD-10-CM | POA: Insufficient documentation

## 2024-02-13 DIAGNOSIS — M25562 Pain in left knee: Secondary | ICD-10-CM | POA: Insufficient documentation

## 2024-02-13 DIAGNOSIS — G894 Chronic pain syndrome: Secondary | ICD-10-CM | POA: Diagnosis present

## 2024-02-13 DIAGNOSIS — G893 Neoplasm related pain (acute) (chronic): Secondary | ICD-10-CM | POA: Diagnosis present

## 2024-02-13 DIAGNOSIS — Z79891 Long term (current) use of opiate analgesic: Secondary | ICD-10-CM | POA: Insufficient documentation

## 2024-02-13 DIAGNOSIS — M4316 Spondylolisthesis, lumbar region: Secondary | ICD-10-CM | POA: Diagnosis present

## 2024-02-13 DIAGNOSIS — M25551 Pain in right hip: Secondary | ICD-10-CM | POA: Insufficient documentation

## 2024-02-13 DIAGNOSIS — M79642 Pain in left hand: Secondary | ICD-10-CM | POA: Diagnosis present

## 2024-02-13 DIAGNOSIS — M542 Cervicalgia: Secondary | ICD-10-CM | POA: Diagnosis present

## 2024-02-13 DIAGNOSIS — M25552 Pain in left hip: Secondary | ICD-10-CM | POA: Diagnosis present

## 2024-02-13 DIAGNOSIS — M79641 Pain in right hand: Secondary | ICD-10-CM | POA: Insufficient documentation

## 2024-02-13 MED ORDER — OXYCODONE HCL 5 MG PO TABS: 5.0000 mg | ORAL_TABLET | Freq: Two times a day (BID) | ORAL | 0 refills | Status: AC | PRN

## 2024-02-13 NOTE — Progress Notes (Signed)
 Nursing Pain Medication Assessment:  Safety precautions to be maintained throughout the outpatient stay will include: orient to surroundings, keep bed in low position, maintain call bell within reach at all times, provide assistance with transfer out of bed and ambulation.  Medication Inspection Compliance: Pill count conducted under aseptic conditions, in front of the patient. Neither the pills nor the bottle was removed from the patient's sight at any time. Once count was completed pills were immediately returned to the patient in their original bottle.  Medication: Oxycodone  IR Pill/Patch Count: 28 of 60 pills/patches remain Pill/Patch Appearance: Markings consistent with prescribed medication Bottle Appearance: Standard pharmacy container. Clearly labeled. Filled Date: 13 / 31 / 2025 Last Medication intake:  Yesterday

## 2024-02-13 NOTE — Patient Instructions (Signed)

## 2024-02-15 ENCOUNTER — Other Ambulatory Visit: Payer: Self-pay | Admitting: Nurse Practitioner

## 2024-02-18 NOTE — Progress Notes (Unsigned)
 "  02/19/24  Patient ID: Carolyn Roy, female   DOB: 1965/09/09, 59 y.o.   MRN: [id number]  Subjective/Objective Telephone follow-up visit to address control of diabetes  Diabetes -Current medications:  metformin  1000mg  BID, Farxiga  10mg  daily, Lantus  60units daily, Humalog  6 units with supper -Patient is using Libre 3+ for CGM and data is linked to [facility] [employer].  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  Will sometimes have some oatmeal at breakfast and will often go back to bed for a while afterwards -ACEi/ARB for cardiorenal protection:  not indicated due to hypotension -UACR 30-300 06/01/23 -Statin for ASCVD risk reduction:  rosuvastatin  40mg  daily  A1c Lab Results  Component Value Date   HGBA1C 8.0 (H) 01/16/2024   HGBA1C 8.0 (H) 10/16/2023   HGBA1C 10.3 (H) 07/05/2023   CMP     Component Value Date/Time   NA 137 01/16/2024 1504   K 4.5 01/16/2024 1504   CL 96 01/16/2024 1504   CO2 26 01/16/2024 1504   GLUCOSE 328 (H) 01/16/2024 1504   GLUCOSE 189 (H) 01/11/2024 1654   BUN 19 01/16/2024 1504   CREATININE 1.12 (H) 01/16/2024 1504   CREATININE 0.96 08/29/2023 1427   CALCIUM  10.4 (H) 01/16/2024 1504   PROT 6.8 01/16/2024 1504   ALBUMIN 4.4 01/16/2024 1504   AST 15 01/16/2024 1504   AST 19  08/29/2023 1427   ALT 16 01/16/2024 1504   ALT 20 08/29/2023 1427   ALKPHOS 87 01/16/2024 1504   BILITOT 0.7 01/16/2024 1504   BILITOT 0.7 08/29/2023 1427   EGFR 58 (L) 01/16/2024 1504   GFRNONAA 52 (L) 01/11/2024 1654   GFRNONAA >60 08/29/2023 1427  Lipid Panel     Component Value Date/Time   CHOL 138 01/16/2024 1504   CHOL WILL FOLLOW 11/15/2016 1452   TRIG 138 01/16/2024 1504   TRIG WILL FOLLOW 11/15/2016 1452   HDL 49 01/16/2024 1504   VLDL WILL FOLLOW 11/15/2016 1452   LDLCALC 65 01/16/2024 1504   LABVLDL 24 01/16/2024 1504   Assessment/Plan  Diabetes -Uncontrolled with A1c >7% -BP at goal of <130/80 -LDL now at goal of <70 -UACR not at goal with SGLT2 on board, and patient cannot tolerate ACEi/ARB based on hypotension- continue to monitor -Advised patient to start using Humalog  5-6 units with lunch in addition to supper based on elevated PM readings and increased intake at lunch time now.  Do not use if not eating. -Continue Lantus  60 units at bedtime, metformin  1000mg  BID, and Farxiga  10mg  daily -Continue Libre 3 for CGM (linked to [facility] [employer] dashboard) -No PCP follow-up scheduled; she was scheduled for 1 week follow-up 12/19 but did not present for visit.  GI upset has improved/mostly resolved with discontinuation of Rybelsus .  I recommend PCP follow-up and A1c, UACR, CMP in March.  Follow-up:  4 weeks  [name], PharmD, DPLA   "

## 2024-02-19 ENCOUNTER — Other Ambulatory Visit: Payer: Self-pay

## 2024-02-19 DIAGNOSIS — I951 Orthostatic hypotension: Secondary | ICD-10-CM

## 2024-02-19 DIAGNOSIS — E1169 Type 2 diabetes mellitus with other specified complication: Secondary | ICD-10-CM

## 2024-02-19 DIAGNOSIS — Z794 Long term (current) use of insulin: Secondary | ICD-10-CM
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
## 2024-02-29 ENCOUNTER — Telehealth: Admitting: Psychiatry

## 2024-02-29 ENCOUNTER — Inpatient Hospital Stay

## 2024-02-29 ENCOUNTER — Telehealth: Payer: Self-pay | Admitting: Internal Medicine

## 2024-02-29 ENCOUNTER — Inpatient Hospital Stay: Admitting: Internal Medicine

## 2024-02-29 ENCOUNTER — Inpatient Hospital Stay: Attending: Internal Medicine

## 2024-02-29 ENCOUNTER — Encounter: Payer: Self-pay | Admitting: Psychiatry

## 2024-02-29 ENCOUNTER — Encounter: Payer: Self-pay | Admitting: Internal Medicine

## 2024-02-29 VITALS — BP 102/71 | HR 83 | Temp 96.9°F | Resp 18 | Ht 64.0 in | Wt 198.7 lb

## 2024-02-29 DIAGNOSIS — C50512 Malignant neoplasm of lower-outer quadrant of left female breast: Secondary | ICD-10-CM

## 2024-02-29 DIAGNOSIS — I890 Lymphedema, not elsewhere classified: Secondary | ICD-10-CM | POA: Diagnosis not present

## 2024-02-29 DIAGNOSIS — Z809 Family history of malignant neoplasm, unspecified: Secondary | ICD-10-CM | POA: Insufficient documentation

## 2024-02-29 DIAGNOSIS — Z1732 Human epidermal growth factor receptor 2 negative status: Secondary | ICD-10-CM | POA: Insufficient documentation

## 2024-02-29 DIAGNOSIS — Z79899 Other long term (current) drug therapy: Secondary | ICD-10-CM | POA: Diagnosis not present

## 2024-02-29 DIAGNOSIS — F329 Major depressive disorder, single episode, unspecified: Secondary | ICD-10-CM | POA: Insufficient documentation

## 2024-02-29 DIAGNOSIS — Z170 Estrogen receptor positive status [ER+]: Secondary | ICD-10-CM | POA: Diagnosis not present

## 2024-02-29 DIAGNOSIS — Z8042 Family history of malignant neoplasm of prostate: Secondary | ICD-10-CM | POA: Diagnosis not present

## 2024-02-29 DIAGNOSIS — F330 Major depressive disorder, recurrent, mild: Secondary | ICD-10-CM

## 2024-02-29 DIAGNOSIS — T451X5A Adverse effect of antineoplastic and immunosuppressive drugs, initial encounter: Secondary | ICD-10-CM | POA: Insufficient documentation

## 2024-02-29 DIAGNOSIS — D509 Iron deficiency anemia, unspecified: Secondary | ICD-10-CM | POA: Diagnosis not present

## 2024-02-29 DIAGNOSIS — G620 Drug-induced polyneuropathy: Secondary | ICD-10-CM | POA: Diagnosis not present

## 2024-02-29 DIAGNOSIS — Z794 Long term (current) use of insulin: Secondary | ICD-10-CM | POA: Diagnosis not present

## 2024-02-29 DIAGNOSIS — G4700 Insomnia, unspecified: Secondary | ICD-10-CM

## 2024-02-29 DIAGNOSIS — Z1721 Progesterone receptor positive status: Secondary | ICD-10-CM | POA: Insufficient documentation

## 2024-02-29 DIAGNOSIS — E1165 Type 2 diabetes mellitus with hyperglycemia: Secondary | ICD-10-CM | POA: Insufficient documentation

## 2024-02-29 DIAGNOSIS — F411 Generalized anxiety disorder: Secondary | ICD-10-CM | POA: Diagnosis not present

## 2024-02-29 DIAGNOSIS — Z800 Family history of malignant neoplasm of digestive organs: Secondary | ICD-10-CM | POA: Diagnosis not present

## 2024-02-29 DIAGNOSIS — F4310 Post-traumatic stress disorder, unspecified: Secondary | ICD-10-CM

## 2024-02-29 DIAGNOSIS — Z9012 Acquired absence of left breast and nipple: Secondary | ICD-10-CM | POA: Insufficient documentation

## 2024-02-29 DIAGNOSIS — E1142 Type 2 diabetes mellitus with diabetic polyneuropathy: Secondary | ICD-10-CM | POA: Diagnosis not present

## 2024-02-29 DIAGNOSIS — Z803 Family history of malignant neoplasm of breast: Secondary | ICD-10-CM | POA: Diagnosis not present

## 2024-02-29 DIAGNOSIS — Z79811 Long term (current) use of aromatase inhibitors: Secondary | ICD-10-CM | POA: Insufficient documentation

## 2024-02-29 DIAGNOSIS — Z923 Personal history of irradiation: Secondary | ICD-10-CM | POA: Insufficient documentation

## 2024-02-29 LAB — CBC WITH DIFFERENTIAL (CANCER CENTER ONLY)
Abs Immature Granulocytes: 0.03 K/uL (ref 0.00–0.07)
Basophils Absolute: 0 K/uL (ref 0.0–0.1)
Basophils Relative: 1 %
Eosinophils Absolute: 0.1 K/uL (ref 0.0–0.5)
Eosinophils Relative: 1 %
HCT: 37.9 % (ref 36.0–46.0)
Hemoglobin: 11.9 g/dL — ABNORMAL LOW (ref 12.0–15.0)
Immature Granulocytes: 0 %
Lymphocytes Relative: 21 %
Lymphs Abs: 1.6 K/uL (ref 0.7–4.0)
MCH: 30.4 pg (ref 26.0–34.0)
MCHC: 31.4 g/dL (ref 30.0–36.0)
MCV: 96.7 fL (ref 80.0–100.0)
Monocytes Absolute: 0.4 K/uL (ref 0.1–1.0)
Monocytes Relative: 5 %
Neutro Abs: 5.2 K/uL (ref 1.7–7.7)
Neutrophils Relative %: 72 %
Platelet Count: 238 K/uL (ref 150–400)
RBC: 3.92 MIL/uL (ref 3.87–5.11)
RDW: 14.2 % (ref 11.5–15.5)
WBC Count: 7.3 K/uL (ref 4.0–10.5)
nRBC: 0 % (ref 0.0–0.2)

## 2024-02-29 LAB — CMP (CANCER CENTER ONLY)
ALT: 12 U/L (ref 0–44)
AST: 16 U/L (ref 15–41)
Albumin: 4 g/dL (ref 3.5–5.0)
Alkaline Phosphatase: 90 U/L (ref 38–126)
Anion gap: 10 (ref 5–15)
BUN: 11 mg/dL (ref 6–20)
CO2: 27 mmol/L (ref 22–32)
Calcium: 10.1 mg/dL (ref 8.9–10.3)
Chloride: 104 mmol/L (ref 98–111)
Creatinine: 0.8 mg/dL (ref 0.44–1.00)
GFR, Estimated: >60 mL/min
Glucose, Bld: 144 mg/dL — ABNORMAL HIGH (ref 70–99)
Potassium: 4.1 mmol/L (ref 3.5–5.1)
Sodium: 142 mmol/L (ref 135–145)
Total Bilirubin: 0.3 mg/dL (ref 0.0–1.2)
Total Protein: 6.7 g/dL (ref 6.5–8.1)

## 2024-02-29 LAB — IRON AND TIBC
Iron: 57 ug/dL (ref 28–170)
Saturation Ratios: 19 % (ref 10.4–31.8)
TIBC: 302 ug/dL (ref 250–450)
UIBC: 246 ug/dL

## 2024-02-29 LAB — FERRITIN: Ferritin: 102 ng/mL (ref 11–307)

## 2024-02-29 LAB — VITAMIN D 25 HYDROXY (VIT D DEFICIENCY, FRACTURES): Vit D, 25-Hydroxy: 40.5 ng/mL (ref 30–100)

## 2024-02-29 NOTE — Assessment & Plan Note (Addendum)
#   May 2019- HIGH RISK- LEFT  Breast cancer Stage III ER/PR pos her 2 NEG on OFS+ Letrozole . Also on -Adjuvant Zometa  q6M [June 2022 x3 years]; Zoladex  today q3M. BIL Mammogram August 2024 [Dr.Piscoya]- WNL. Currently on Letrozole - [facility]]  # No clinical evidence of recurrence-  stable; will refill letrozole  [plan extended AI until- 2029].   # Mild Anemia- Hb 11-12-  Hx of colonoscopy- 2022 ? S/p colonoscopy [nov 2023;  Dr.Vanga. APRIL 2025-; sat-20%.; continue linzess-/ on dulcolax- continue PO gentle iron. - stable.   # Depression/ Insomnia--cymblata/welbutrin/ off buspar - OFF Amiytrplitine qhs /Dr.vaslow[DI-none as per uptodate*] s/p evaluation- [Dr.Reina Hisada/Therapist]- awaiting to start abilify  # Bone health-JUNE 2025-- BMD T score:Normal-. Joint pains-multifactorial-continue ca + vit D- s/p adjuvant zometa - x 6 [July 2025]- stable.    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   # Lymphedema-left chest walls/p physical therapy- stable.   # DM-2 on insulin - poorly controlled  [A1c-6.8] Blood glucose - stable.   # Port malfunction: flush; s/p TPA-- # port/IV access- Stable  Zometa  q 41m- last in July 2025; zola 3 m- stop 11/29/22  # DISPOSITION: # bil screening mammo- pt missed in 2025 # de-acess # port flush in 3 month # Follow up in 6 month; MD;port/ cbc/cmp;iron  ferritin- ca-27-29; vit D 25-- Dr.B

## 2024-02-29 NOTE — Progress Notes (Signed)
 [facility] CONSULT NOTE  Patient Care Team: Valerio Melanie DASEN, NP as PCP - General (Nurse Practitioner) Hobart Powell BRAVO, MD (Inactive) as PCP - Cardiology (Cardiology) Dessa, Reyes ORN, MD (General Surgery) Elby Webb Loges, [name] (Obstetrics and Gynecology) Rennie Cindy SAUNDERS, MD as Consulting Physician (Oncology) Leora Lynwood SAUNDERS, MD as Consulting Physician (Orthopedic Surgery)  CHIEF COMPLAINTS/PURPOSE OF CONSULTATION: Breast cancer  Oncology History Overview Note  # MAY 2019-  clinical stage IIIA (T3N1Mx) left breast cancer s/p biopsy on 06/14/2017. -Pathology revealed grade III invasive ductal carcinoma. -Axillary FNA revealed malignant cells c/w metastatic carcinoma. Tumor was ER + (90%), PR + (30%), Her2/neu - and Ki67 70%.  CA27.29 was 7.8 on 06/14/2017.  # She received 4 cycles of AC with Neulasta  support (07/20/2017 - 08/31/2017).;  neoadjuvant Taxol  on 09/14/2017.  #DEC 2019- Lumpectomy/sentinel lymph node biopsy [Dr.[name]]-complete pathologic response  # s/p RT [delayed sec to wound infection; Dr.[name]] finished RT [4/12]  # April 14th 2020- START TAM; stopped in mid-May secondary intolerance [severe migraines].  # 18th May 2020-start Arimidex  [hormonal profile-postmenopausal;add Zoladex  q3M]; STOPPED sec intolerance/joint pain; NOV 2021- STARTED AROMASIN ; Stopped x 2 months sec to extreme fatigue/severe joint pains.   # July 2022- START tamoxifen  10 mg a day; Stopped Tamoxifen  June 2024.   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 Secord/Berchuck]-negative for malignancy/ # DM-2- poorly controlled.   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   # PAP SMEAR- RECOMMENDED 2022- summer  -------------------------------------------  DIAGNOSIS: left breast cancer  STAGE:  III       ;GOALS:  cure      Cancer of midline of breast, left (HCC) (Resolved)  06/15/2017 Initial Diagnosis   Cancer of midline of breast, left (HCC)   07/20/2017 - 11/16/2017 Chemotherapy   The patient had dexamethasone  (DECADRON ) 4 MG tablet, 1 of 1 cycle, Start date: --, End date: -- DOXOrubicin  (ADRIAMYCIN ) chemo injection 122 mg, 60 mg/m2 = 122 mg, Intravenous,  Once, 4 of 4 cycles Administration: 122 mg (07/20/2017), 122 mg (08/03/2017), 122 mg (08/17/2017), 122 mg (08/31/2017) palonosetron  (ALOXI ) injection 0.25 mg, 0.25 mg, Intravenous,  Once, 4 of 4 cycles Administration: 0.25 mg (07/20/2017), 0.25 mg (08/03/2017), 0.25 mg (08/17/2017), 0.25 mg (08/31/2017) pegfilgrastim  (NEULASTA ) injection 6 mg, 6 mg, Subcutaneous, Once, 5 of 5 cycles Administration: 6 mg (07/21/2017), 6 mg (08/04/2017), 6 mg (08/18/2017), 6 mg (09/01/2017) cyclophosphamide  (CYTOXAN ) 1,220 mg in sodium chloride  0.9 % 250 mL chemo infusion, 600 mg/m2 = 1,220 mg, Intravenous,  Once, 4 of 4 cycles Administration: 1,220 mg (07/20/2017), 1,220 mg (08/03/2017), 1,220 mg (08/17/2017), 1,220 mg (08/31/2017) PACLitaxel  (TAXOL ) 162 mg in sodium chloride  0.9 % 250 mL chemo infusion (</= 80mg /m2), 80 mg/m2 = 162 mg, Intravenous,  Once, 10 of 12 cycles Dose modification: 65 mg/m2 (original dose 80 mg/m2, Cycle 13, Reason: Provider Judgment, Comment: neuropathy) Administration: 162 mg (09/14/2017), 162 mg (09/21/2017), 162 mg (09/28/2017), 162 mg (10/05/2017), 162 mg (10/12/2017), 162 mg (10/19/2017), 162 mg (10/26/2017), 132 mg (11/02/2017), 132 mg (11/09/2017), 132 mg (11/16/2017) fosaprepitant  (EMEND ) 150 mg, dexamethasone  (DECADRON ) 12 mg in sodium chloride  0.9 % 145 mL IVPB, , Intravenous,  Once, 4 of 4 cycles Administration:  (07/20/2017),  (08/03/2017),  (08/17/2017),  (08/31/2017)  for chemotherapy treatment.    Malignant neoplasm of lower-outer quadrant of left breast of female, estrogen receptor positive (HCC)  11/08/2017 Initial Diagnosis   Malignant neoplasm of  lower-outer quadrant of left breast of female, estrogen receptor positive (HCC)    HISTORY OF PRESENTING ILLNESS: Ambulating independently.  Alone.  Carolyn Roy 59 y.o.  female with a history of stage III ER PR positive HER-2/neu negative breast cancer currently on Letrozole  s/p adjuvant Zometa  [last July 2025] is here for follow-up.   Discussed the use of AI scribe software for clinical note transcription with the patient, who gave verbal consent to proceed.  History of Present Illness   Carolyn Roy is a 59 year old female with stage III high-risk estrogen receptor positive left breast cancer who presents for routine oncology follow-up.  She remains on adjuvant endocrine therapy with letrozole  and ovarian suppression, as well as azumatazine, following completion of primary treatment for stage III high-risk breast cancer. She previously received Zometa  infusions for bone health and has completed the recommended course. Surveillance imaging is current except for a missed mammogram in August of last year; a new order will be placed. Her most recent bone density in June of the previous year was normal.  She continues to experience significant chemotherapy-induced peripheral neuropathy, resulting in fatigue and difficulty with prolonged ambulation, standing, and climbing ladders. These symptoms have led her to discontinue employment at [employer] and have substantially impacted her functional status and quality of life.  She is managed for iron deficiency anemia with daily gentle iron supplementation. Her hemoglobin has been in the mid to low range. She also has constipation, previously severe and requiring hospitalization, attributed to prior GLP-1 therapy. She is now off GLP-1 agents and takes daily Dulcolax, with improvement in bowel function.  She is under psychiatric care for major depressive disorder, generalized anxiety disorder, and post-traumatic stress disorder. She is followed  by Dr. Hisada for medication management and sees a therapist regularly. Her depression and anxiety are currently not well controlled, and her psychiatrist is planning a medication adjustment pending an EKG. She is taking bupropion  and duloxetine , and is discontinuing Buspar .      Review of Systems  Constitutional:  Positive for malaise/fatigue. Negative for chills, diaphoresis, fever and weight loss.  HENT:  Negative for nosebleeds and sore throat.   Eyes:  Negative for double vision.  Respiratory:  Negative for cough, hemoptysis, sputum production, shortness of breath and wheezing.   Cardiovascular:  Negative for chest pain, palpitations, orthopnea and leg swelling.  Gastrointestinal:  Negative for abdominal pain, blood in stool, constipation, diarrhea, heartburn, melena, nausea and vomiting.  Genitourinary:  Negative for dysuria, frequency and urgency.  Musculoskeletal:  Positive for back pain and joint pain.  Skin: Negative.  Negative for itching and rash.  Neurological:  Positive for tingling. Negative for dizziness, focal weakness, weakness and headaches.  Endo/Heme/Allergies:  Does not bruise/bleed easily.  Psychiatric/Behavioral:  Negative for depression. The patient is not nervous/anxious and does not have insomnia.      MEDICAL HISTORY:  Past Medical History:  Diagnosis Date   Allergy    Breast cancer (HCC)    Cancer (HCC) 06/15/2017   5.1 cm, T3,N1 (clinical): ER/ PR positive, Her 2 neu not overexpressed, High Ki 67. Neuoadjuvant chemotherapy.    Depression    Diabetes mellitus without complication (HCC) 2017   Edema of left upper extremity    Endometriosis    Family history of breast cancer    Headache    migraines   Hyperlipidemia    Lymphedema of left arm    Ovarian mass  Personal history of chemotherapy    Personal history of radiation therapy    Pneumonia    2018    SURGICAL HISTORY: Past Surgical History:  Procedure Laterality Date   AXILLARY LYMPH NODE  BIOPSY Left 07/14/2017   Procedure: INSERTION GEL MARK CLIP LEFT AXILLA;  Surgeon: Dessa Reyes ORN, MD;  Location: [facility] ORS;  Service: General;  Laterality: Left;   BREAST BIOPSY Left    Dr Curtis BREAST METASTATIC CARCINOMA   BREAST LUMPECTOMY Left 01/12/2018   COLONOSCOPY WITH PROPOFOL  N/A 12/27/2019   Procedure: COLONOSCOPY WITH PROPOFOL ;  Surgeon: Janalyn Keene NOVAK, MD;  Location: [facility] ENDOSCOPY;  Service: Endoscopy;  Laterality: N/A;   COLONOSCOPY WITH PROPOFOL  N/A 01/05/2022   Procedure: COLONOSCOPY WITH PROPOFOL ;  Surgeon: Unk Corinn Skiff, MD;  Location: [facility] ENDOSCOPY;  Service: Gastroenterology;  Laterality: N/A;   OOPHORECTOMY     PARTIAL MASTECTOMY WITH NEEDLE LOCALIZATION Left 01/12/2018   Procedure: PARTIAL MASTECTOMY WITH NEEDLE LOCALIZATION;  Surgeon: Dessa Reyes ORN, MD;  Location: [facility] ORS;  Service: General;  Laterality: Left;   PORTACATH PLACEMENT Right 07/14/2017   Procedure: INSERTION PORT-A-CATH;  Surgeon: Dessa Reyes ORN, MD;  Location: [facility] ORS;  Service: General;  Laterality: Right;   SENTINEL NODE BIOPSY Left 01/12/2018   Procedure: SENTINEL NODE BIOPSY;  Surgeon: Dessa Reyes ORN, MD;  Location: [facility] ORS;  Service: General;  Laterality: Left;   TUBAL LIGATION      SOCIAL HISTORY: Social History   Socioeconomic History   Marital status: Married    Spouse name: Not on file   Number of children: Not on file   Years of education: Not on file   Highest education level: GED or equivalent  Occupational History   Not on file  Tobacco Use   Smoking status: Former    Average packs/day: 1 pack/day for 11.0 years (11.0 ttl pk-yrs)    Types: Cigarettes    Start date: 01/07/2012   Smokeless tobacco: Former    Types: Snuff  Vaping Use   Vaping status: Every Day  Substance and Sexual Activity   Alcohol use: No    Alcohol/week: 0.0 standard drinks of alcohol   Drug use: No   Sexual activity: Yes  Other Topics Concern   Not on file  Social  History Narrative   ** Merged History Encounter **       Social Drivers of Health   Tobacco Use: Medium Risk (02/29/2024)   Patient History    Smoking Tobacco Use: Former    Smokeless Tobacco Use: Former    Passive Exposure: Not on Actuary Strain: Low Risk (06/06/2022)   Overall Financial Resource Strain (CARDIA)    Difficulty of Paying Living Expenses: Not hard at all  Food Insecurity: Food Insecurity Present (06/06/2022)   Hunger Vital Sign    Worried About Running Out of Food in the Last Year: Sometimes true    Ran Out of Food in the Last Year: Sometimes true  Transportation Needs: No Transportation Needs (06/06/2022)   PRAPARE - Administrator, Civil Service (Medical): No    Lack of Transportation (Non-Medical): No  Physical Activity: Sufficiently Active (06/06/2022)   Exercise Vital Sign    Days of Exercise per Week: 5 days    Minutes of Exercise per Session: 150+ min  Stress: No Stress Concern Present (06/06/2022)   [employer] of Occupational Health - Occupational Stress Questionnaire    Feeling of Stress : Only a little  Social Connections: Socially  Isolated (06/06/2022)   Social Connection and Isolation Panel    Frequency of Communication with Friends and Family: Once a week    Frequency of Social Gatherings with Friends and Family: Never    Attends Religious Services: Never    Database Administrator or Organizations: No    Attends Engineer, Structural: Not on file    Marital Status: Married  Catering Manager Violence: Not on file  Depression (PHQ2-9): Low Risk (02/29/2024)   Depression (PHQ2-9)    PHQ-2 Score: 0  Recent Concern: Depression (PHQ2-9) - High Risk (01/16/2024)   Depression (PHQ2-9)    PHQ-2 Score: 13  Alcohol Screen: Low Risk (08/22/2022)   Alcohol Screen    Last Alcohol Screening Score (AUDIT): 0  Housing: High Risk (06/06/2022)   Housing    Last Housing Risk Score: 2  Utilities: Not on file  Health Literacy:  Not on file    FAMILY HISTORY: Family History  Problem Relation Age of Onset   Colon cancer Mother    Cancer Mother    Other Father        family hx on dad's side: breast, colon, stomach cancer-biological dad   Diabetes Brother    Pancreatitis Brother    Prostate cancer Brother 58       currently 73 / maternal half-brother   Breast cancer Maternal Aunt 27       currently 64   Breast cancer Maternal Grandmother 40       deceased 67s   Colon cancer Maternal Grandmother    Breast cancer Other 93       mother's sister; deceased 59   Breast cancer Other        mother's sister; age at dx unknown    ALLERGIES:  is allergic to gnp glp-1 daily support [germanium] and aspirin .  MEDICATIONS:  Current Outpatient Medications  Medication Sig Dispense Refill   aspirin -acetaminophen -caffeine (EXCEDRIN MIGRAINE) 250-250-65 MG tablet Take 2 tablets by mouth daily as needed for headache.     bisacodyl (DULCOLAX) 5 MG EC tablet Take 1 tablet (5 mg total) by mouth daily as needed for moderate constipation. 30 tablet 1   buPROPion  (WELLBUTRIN  XL) 300 MG 24 hr tablet Take 1 tablet (300 mg total) by mouth daily. 90 tablet 4   Continuous Glucose Sensor (FREESTYLE LIBRE 3 PLUS SENSOR) MISC by Does not apply route. Change sensor every 15 days.     dapagliflozin  propanediol (FARXIGA ) 10 MG TABS tablet TAKE 1 TABLET BY MOUTH DAILY BEFORE BREAKFAST. 90 tablet 0   DULoxetine  (CYMBALTA ) 60 MG capsule TAKE 1 CAPSULE BY MOUTH EVERY DAY 90 capsule 0   famotidine  (PEPCID ) 20 MG tablet TAKE 1 TABLET BY MOUTH EVERY DAY 90 tablet 3   gabapentin  (NEURONTIN ) 300 MG capsule Take 300 MG (one capsule) by mouth in the morning and then take 600 MG (two capsules) by mouth in the evening. 270 capsule 4   insulin  glargine (LANTUS  SOLOSTAR) 100 UNIT/ML Solostar Pen Inject 60 Units into the skin daily. To replaced order sent 9/8 for vials. 60 mL 5   insulin  lispro (HUMALOG  KWIKPEN) 100 UNIT/ML KwikPen Start by injecting 5 to 6  units into the skin before dinner. Do not inject if you do not eat. (Patient taking differently: Inject 5-6 units with lunch and supper- do not use if not eating) 15 mL 2   letrozole  (FEMARA ) 2.5 MG tablet TAKE 1 TABLET BY MOUTH EVERY DAY 90 tablet 1   meloxicam  (MOBIC ) 15  MG tablet TAKE 1 TABLET (15 MG TOTAL) BY MOUTH DAILY. 90 tablet 1   metFORMIN  (GLUCOPHAGE ) 1000 MG tablet Take 1 tablet (1,000 mg total) by mouth 2 (two) times daily with a meal. 180 tablet 3   ondansetron  (ZOFRAN ) 8 MG tablet Take 1 tablet (8 mg total) by mouth 2 (two) times daily as needed for nausea or vomiting. 60 tablet 4   oxyCODONE  (OXY IR/ROXICODONE ) 5 MG immediate release tablet Take 1 tablet (5 mg total) by mouth 2 (two) times daily as needed for severe pain (pain score 7-10). Must last 30 days 60 tablet 0   promethazine  (PHENERGAN ) 25 MG tablet Take 1 tablet (25 mg total) by mouth every 6 (six) hours as needed for nausea or vomiting. 40 tablet 0   rosuvastatin  (CRESTOR ) 40 MG tablet Take 1 tablet (40 mg total) by mouth daily. 90 tablet 4   SUMAtriptan  (IMITREX ) 100 MG tablet TAKE 1 TAB BY MOUTH EVERY 2 HOURS AS NEEDED FOR MIGRAINE. MAY REPEAT IN 2 HOURS IF HEADACHE PERSISTS OR RECURS. 9 tablet 3   Blood Glucose Monitoring Suppl (ONETOUCH VERIO) w/Device KIT Use to check blood sugar 3 times a day and document results, bring to appointments.  Goal is <130 fasting blood sugar and <180 two hours after meals. 1 kit 0   busPIRone  (BUSPAR ) 5 MG tablet TAKE 1 TABLET BY MOUTH TWICE A DAY (Patient not taking: Reported on 02/29/2024) 180 tablet 1   glucose blood test strip Use to check blood sugar 3 times daily, fasting in morning with goal <130 and 2 hours after meals with goal <180.  Bring blood sugar log to visits. 100 each 12   Insulin  Pen Needle (BD PEN NEEDLE NANO 2ND GEN) 32G X 4 MM MISC For use with insulin  pens daily. 100 each 12   [START ON 03/26/2024] oxyCODONE  (OXY IR/ROXICODONE ) 5 MG immediate release tablet Take 1 tablet  (5 mg total) by mouth 2 (two) times daily as needed for severe pain (pain score 7-10). Must last 30 days 60 tablet 0   [START ON 04/25/2024] oxyCODONE  (OXY IR/ROXICODONE ) 5 MG immediate release tablet Take 1 tablet (5 mg total) by mouth 2 (two) times daily as needed for severe pain (pain score 7-10). Must last 30 days 60 tablet 0   polyethylene glycol (MIRALAX ) 17 g packet Take 17 g by mouth daily. 14 each 0   No current facility-administered medications for this visit.   Facility-Administered Medications Ordered in Other Visits  Medication Dose Route Frequency Provider Last Rate Last Admin   heparin  lock flush 100 unit/mL  500 Units Intravenous Once Corcoran, Melissa C, MD       sodium chloride  flush (NS) 0.9 % injection 10 mL  10 mL Intravenous Once Corcoran, Melissa C, MD          .  PHYSICAL EXAMINATION: ECOG PERFORMANCE STATUS: 0 - Asymptomatic  Vitals:   02/29/24 0942  BP: 102/71  Pulse: 83  Resp: 18  Temp: (!) 96.9 F (36.1 C)  SpO2: 100%    Filed Weights   02/29/24 0942  Weight: 198 lb 11.2 oz (90.1 kg)     Physical Exam HENT:     Head: Normocephalic and atraumatic.     Mouth/Throat:     Pharynx: No oropharyngeal exudate.  Eyes:     Pupils: Pupils are equal, round, and reactive to light.  Cardiovascular:     Rate and Rhythm: Normal rate and regular rhythm.  Pulmonary:  Effort: No respiratory distress.     Breath sounds: No wheezing.  Abdominal:     General: Bowel sounds are normal. There is no distension.     Palpations: Abdomen is soft. There is no mass.     Tenderness: There is no abdominal tenderness. There is no guarding or rebound.  Musculoskeletal:        General: No tenderness. Normal range of motion.     Cervical back: Normal range of motion and neck supple.  Skin:    General: Skin is warm.     Comments:     Neurological:     Mental Status: She is alert and oriented to person, place, and time.  Psychiatric:        Mood and Affect: Affect  normal.      LABORATORY DATA:  I have reviewed the data as listed Lab Results  Component Value Date   WBC 7.3 02/29/2024   HGB 11.9 (L) 02/29/2024   HCT 37.9 02/29/2024   MCV 96.7 02/29/2024   PLT 238 02/29/2024   Recent Labs    03/01/23 1405 04/03/23 0830 08/29/23 1427 10/16/23 1040 01/11/24 1654 01/16/24 1504  NA 143   < > 139 139 146* 137  K 3.4*   < > 3.6 4.3 4.1 4.5  CL 103   < > 105 102 103 96  CO2 30   < > 27 24 30 26   GLUCOSE 181*   < > 156* 237* 189* 328*  BUN 14   < > 12 11 15 19   CREATININE 0.67   < > 0.96 1.05* 1.21* 1.12*  CALCIUM  9.8   < > 9.5 9.4 10.6* 10.4*  GFRNONAA >60  --  >60  --  52*  --   PROT 6.2*   < > 7.2 6.3 7.7 6.8  ALBUMIN 3.4*   < > 4.1 4.0 4.6 4.4  AST 18   < > 19 16 22 15   ALT 15   < > 20 18 22 16   ALKPHOS 62   < > 79 108 101 87  BILITOT 0.5   < > 0.7 0.3 0.5 0.7   < > = values in this interval not displayed.    RADIOGRAPHIC STUDIES: I have personally reviewed the radiological images as listed and agreed with the findings in the report. No results found.  ASSESSMENT & PLAN:   Malignant neoplasm of lower-outer quadrant of left breast of female, estrogen receptor positive (HCC) # May 2019- HIGH RISK- LEFT  Breast cancer Stage III ER/PR pos her 2 NEG on OFS+ Letrozole . Also on -Adjuvant Zometa  q6M [June 2022 x3 years]; Zoladex  today q3M. BIL Mammogram August 2024 [Dr.Piscoya]- WNL. Currently on Letrozole - [facility]]  # No clinical evidence of recurrence-  stable; will refill letrozole  [plan extended AI until- 2029].   # Mild Anemia- Hb 11-12-  Hx of colonoscopy- 2022 ? S/p colonoscopy [nov 2023;  Dr.Vanga. APRIL 2025-; sat-20%.; continue linzess-/ on dulcolax- continue PO gentle iron. - stable.   # Depression/ Insomnia--cymblata/welbutrin/ off buspar - OFF Amiytrplitine qhs /Dr.vaslow[DI-none as per uptodate*] s/p evaluation- [Dr.Reina Hisada/Therapist]- awaiting to start abilify  # Bone health-JUNE 2025-- BMD T  score:Normal-. Joint pains-multifactorial-continue ca + vit D- s/p adjuvant zometa - x 6 [July 2025]- stable.    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   # Lymphedema-left chest walls/p physical therapy- stable.   # DM-2 on insulin - poorly controlled  [A1c-6.8] Blood glucose - stable.   #  Port malfunction: flush; s/p TPA-- # port/IV access- Stable  Zometa  q 19m- last in July 2025; zola 3 m- stop 11/29/22  # DISPOSITION: # bil screening mammo- pt missed in 2025 # de-acess # port flush in 3 month # Follow up in 6 month; MD;port/ cbc/cmp;iron  ferritin- ca-27-29; vit D 25-- Dr.B    All questions were answered. The patient knows to call the clinic with any problems, questions or concerns.    Cindy JONELLE Joe, MD 02/29/2024 10:42 AM

## 2024-02-29 NOTE — Progress Notes (Signed)
 Patient doing good; no new or acute concerns at this time.

## 2024-02-29 NOTE — Patient Instructions (Signed)
 Continue Duloxetine  60 mg daily  Continue bupropion  300 mg daily Decrease buspar  5 mg daily for 3 days, then discontinue  Obtain EKG - please call [phone] to make an appointment Plan to start Abilify 2 mg at night after reviewing EKG Referred to therapy onsite Next appointment- 3/5 at 8:30

## 2024-02-29 NOTE — Telephone Encounter (Signed)
 Called pt to give number for [place] to sched mammogram - left vm with phone number and asked pt to get this appt scheduled and completed before her next appt w/Dr B - [facility]

## 2024-03-02 LAB — CANCER ANTIGEN 27.29: CA 27.29: 11.5 U/mL (ref 0.0–38.6)

## 2024-03-04 ENCOUNTER — Ambulatory Visit (INDEPENDENT_AMBULATORY_CARE_PROVIDER_SITE_OTHER)

## 2024-03-04 DIAGNOSIS — F411 Generalized anxiety disorder: Secondary | ICD-10-CM | POA: Insufficient documentation

## 2024-03-04 DIAGNOSIS — F4310 Post-traumatic stress disorder, unspecified: Secondary | ICD-10-CM | POA: Insufficient documentation

## 2024-03-04 DIAGNOSIS — F330 Major depressive disorder, recurrent, mild: Secondary | ICD-10-CM | POA: Insufficient documentation
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
## 2024-03-14 ENCOUNTER — Ambulatory Visit (INDEPENDENT_AMBULATORY_CARE_PROVIDER_SITE_OTHER)

## 2024-03-14 DIAGNOSIS — F4310 Post-traumatic stress disorder, unspecified: Secondary | ICD-10-CM

## 2024-03-14 DIAGNOSIS — F330 Major depressive disorder, recurrent, mild: Secondary | ICD-10-CM

## 2024-03-14 DIAGNOSIS — F411 Generalized anxiety disorder: Secondary | ICD-10-CM

## 2024-03-14 NOTE — Progress Notes (Signed)
 Virtual Visit via Video Note  I connected with Carolyn Roy DELENA Fitch on 03/14/24 at  9:00 AM EST by a video enabled telemedicine application and verified that I am speaking with the correct person using two identifiers.  Location: Patient: [address]  [place] [address]  Provider: Remote Office   I discussed the limitations of evaluation and management by telemedicine and the availability of in person appointments. The patient expressed understanding and agreed to proceed.  History of Present Illness: See below    Observations/Objective: See below   Assessment and Plan: See below   Follow Up Instructions: See belowI discussed the assessment and treatment plan with the patient. The patient was provided an opportunity to ask questions and all were answered. The patient agreed with the plan and demonstrated an understanding of the instructions.   The patient was advised to call back or seek an in-person evaluation if the symptoms worsen or if the condition fails to improve as anticipated.  I provided 58 minutes of non-face-to-face time during this encounter.   Curtiss RAYMOND Carrie, [facility]   THERAPIST PROGRESS NOTE  Session Time: 26  Participation Level: Active  Behavioral Response: NeatAlertDysphoric  Type of Therapy: Individual Therapy  Treatment Goals addressed: STG: Laranda will participate in therapy to identify root causes of anxiety and learn and develop skills to manage symptoms to reduce anxiety intensity during everyday activities from an intensity level 10 to self reported intensity level 4 or below.  STG: Tannisha will  participate in therapy to learn and develop skills to reduce the impact of the daily symptoms of depression from daily to no more than three times a week.   STG: Janelly will participate in therapy and engage in EMDR and other therapeutic techniques to process and resolve past trauma and reduce trauma reactivity from daily to no more than three times  per week.   ProgressTowards Goals: Initial  Interventions: Motivational Interviewing  Summary: Carolyn Roy is a 59 y.o. female who presents with a history of depression anxiety and trauma. Therapist greeted Maanasa warmly and spent a few minutes checking in and discussing how things have been since the last session.  Christal presents for session alert and oriented; mood and affect neutral. Speech is clear and coherent at normal rate and tone. Engaged and cooperative in session. Therapist spent the first part of session developing treatment plan and goals.  Therapist also spends some time to assess management of behavioral symptoms and any safety concerns and current level of functioning.  Amunique denies all safety concerns at this time.    Therapist spent the second part of session on psychoeducation, skill development and therapeutic interventions in session.  Antwan shared that she notices her routine is very dysregulated and she goes to bed late and wakes up pretty late sometimes after 12 PM.  She also described her anxiety preventing her from getting tasks completed and that her mind starts racing and then she jumps from task to task.  Therapist spent some time exploring her daily routine and using OARS to help her identify strategies for change and points where she could take action.  Therapist facilitated goal-directed thinking during session and helped move her towards the action of creating a daily schedule for herself to create more structure and help her set targeted actions she could take towards tasks that feel overwhelming.  Pauletta created a schedule for herself in session with therapist and included 1 daily task item and 1 enjoyable activity.  Carolyn Roy  responded well to the interventions and shared that she felt good about having a plan but continued to have some anxiety about failing.  Therapist used supportive language and cognitive restructuring to dispute the negative  thinking and identify more positive ways of looking at outcomes. A wrap up meditation activity was done at the end of the session and Annabel responded well to it.   Suicidal/Homicidal: No without intent/plan  Therapist Response: Therapist used Motivational Interviewing to facilitate discussion, elicit pertinent information and summarized thoughts and feelings for clarity and accuracy. Used supportive language and validation to provide a safe space to process thoughts and feelings. Therapist used additional therapeutic techniques in session to address presenting concerns.  The ongoing treatment plan includes: therapeutic work on addressing dysfunctional coping strategies and mechanisms; and helping the patient gain greater awareness, understanding, and expression of underlying emotions and develop effective and functional coping strategies to reduce impact of symptoms on daily living. Treatment continues to show good evolution and development. Treatment to continue as indicated.   Therapist reviewed and summarized what was discussed in session today and asked Wanita if there were any questions and provided clarity as needed. No safety concerns were noted during session  and SI/HI/AVH were not present. Homework was assigned and a follow up meeting time was scheduled.   Plan: Return again in 1 weeks.  Diagnosis: MDD (major depressive disorder), recurrent episode, mild  PTSD (post-traumatic stress disorder)  GAD (generalized anxiety disorder)  Collaboration of Care: Medication Management AEB chart review  Patient/Guardian was advised Release of Information must be obtained prior to any record release in order to collaborate their care with an outside provider. Patient/Guardian was advised if they have not already done so to contact the registration department to sign all necessary forms in order for us  to release information regarding their care.   Consent: Patient/Guardian gives verbal  consent for treatment and assignment of benefits for services provided during this visit. Patient/Guardian expressed understanding and agreed to proceed.   Curtiss RAYMOND Carrie, [facility] 03/14/2024

## 2024-03-15 ENCOUNTER — Other Ambulatory Visit: Payer: Self-pay | Admitting: Internal Medicine

## 2024-03-18 ENCOUNTER — Other Ambulatory Visit

## 2024-03-20 ENCOUNTER — Ambulatory Visit

## 2024-03-21 ENCOUNTER — Ambulatory Visit

## 2024-03-25 ENCOUNTER — Ambulatory Visit

## 2024-03-29 ENCOUNTER — Encounter

## 2024-04-04 ENCOUNTER — Ambulatory Visit

## 2024-04-11 ENCOUNTER — Ambulatory Visit: Admitting: Psychiatry

## 2024-05-09 ENCOUNTER — Encounter: Admitting: Nurse Practitioner

## 2024-05-29 ENCOUNTER — Inpatient Hospital Stay

## 2024-08-28 ENCOUNTER — Inpatient Hospital Stay

## 2024-08-28 ENCOUNTER — Inpatient Hospital Stay: Admitting: Internal Medicine
